# Patient Record
Sex: Male | Born: 1940
Health system: Southern US, Community
[De-identification: ages and names within clinical notes are randomized; demographics above are authoritative.]

## PROBLEM LIST (undated history)

## (undated) DIAGNOSIS — IMO0002 Reserved for concepts with insufficient information to code with codable children: Secondary | ICD-10-CM

## (undated) DIAGNOSIS — C9000 Multiple myeloma not having achieved remission: Secondary | ICD-10-CM

## (undated) DIAGNOSIS — K2210 Ulcer of esophagus without bleeding: Secondary | ICD-10-CM

## (undated) DIAGNOSIS — K449 Diaphragmatic hernia without obstruction or gangrene: Secondary | ICD-10-CM

## (undated) DIAGNOSIS — Z85820 Personal history of malignant melanoma of skin: Secondary | ICD-10-CM

## (undated) DIAGNOSIS — IMO0001 Reserved for inherently not codable concepts without codable children: Secondary | ICD-10-CM

## (undated) DIAGNOSIS — D126 Benign neoplasm of colon, unspecified: Secondary | ICD-10-CM

## (undated) DIAGNOSIS — N400 Enlarged prostate without lower urinary tract symptoms: Secondary | ICD-10-CM

## (undated) DIAGNOSIS — M1990 Unspecified osteoarthritis, unspecified site: Secondary | ICD-10-CM

## (undated) DIAGNOSIS — Z5111 Encounter for antineoplastic chemotherapy: Secondary | ICD-10-CM

## (undated) DIAGNOSIS — I82409 Acute embolism and thrombosis of unspecified deep veins of unspecified lower extremity: Secondary | ICD-10-CM

## (undated) DIAGNOSIS — M549 Dorsalgia, unspecified: Secondary | ICD-10-CM

## (undated) DIAGNOSIS — K5790 Diverticulosis of intestine, part unspecified, without perforation or abscess without bleeding: Secondary | ICD-10-CM

## (undated) DIAGNOSIS — M19041 Primary osteoarthritis, right hand: Secondary | ICD-10-CM

## (undated) DIAGNOSIS — I2699 Other pulmonary embolism without acute cor pulmonale: Secondary | ICD-10-CM

## (undated) DIAGNOSIS — K219 Gastro-esophageal reflux disease without esophagitis: Secondary | ICD-10-CM

## (undated) DIAGNOSIS — C4492 Squamous cell carcinoma of skin, unspecified: Secondary | ICD-10-CM

## (undated) DIAGNOSIS — C3491 Malignant neoplasm of unspecified part of right bronchus or lung: Principal | ICD-10-CM

## (undated) DIAGNOSIS — Z87438 Personal history of other diseases of male genital organs: Secondary | ICD-10-CM

## (undated) HISTORY — DX: Primary osteoarthritis, right hand: M19.041

## (undated) HISTORY — DX: Personal history of malignant melanoma of skin: Z85.820

## (undated) HISTORY — DX: Benign prostatic hyperplasia without lower urinary tract symptoms: N40.0

## (undated) HISTORY — DX: Personal history of other diseases of male genital organs: Z87.438

## (undated) HISTORY — DX: Diverticulosis of intestine, part unspecified, without perforation or abscess without bleeding: K57.90

## (undated) HISTORY — PX: OTHER SURGICAL HISTORY: SHX169

## (undated) HISTORY — PX: INGUINAL HERNIA REPAIR: SHX194

## (undated) HISTORY — DX: Benign neoplasm of colon, unspecified: D12.6

## (undated) HISTORY — DX: Ulcer of esophagus without bleeding: K22.10

## (undated) HISTORY — DX: Unspecified osteoarthritis, unspecified site: M19.90

## (undated) HISTORY — DX: Acute embolism and thrombosis of unspecified deep veins of unspecified lower extremity: I82.409

## (undated) HISTORY — DX: Squamous cell carcinoma of skin, unspecified: C44.92

## (undated) HISTORY — DX: Malignant neoplasm of unspecified part of right bronchus or lung: C34.91

## (undated) HISTORY — PX: APPENDECTOMY: SHX54

## (undated) HISTORY — DX: Gastro-esophageal reflux disease without esophagitis: K21.9

## (undated) HISTORY — DX: Reserved for concepts with insufficient information to code with codable children: IMO0002

## (undated) HISTORY — DX: Multiple myeloma not having achieved remission: C90.00

## (undated) HISTORY — DX: Diaphragmatic hernia without obstruction or gangrene: K44.9

## (undated) HISTORY — DX: Other pulmonary embolism without acute cor pulmonale: I26.99

## (undated) HISTORY — DX: Encounter for antineoplastic chemotherapy: Z51.11

---

## 1994-07-11 DIAGNOSIS — D126 Benign neoplasm of colon, unspecified: Secondary | ICD-10-CM

## 1994-07-11 HISTORY — DX: Benign neoplasm of colon, unspecified: D12.6

## 1995-05-30 ENCOUNTER — Encounter: Payer: Self-pay | Admitting: Gastroenterology

## 1997-11-24 ENCOUNTER — Encounter: Payer: Self-pay | Admitting: Gastroenterology

## 1998-04-02 ENCOUNTER — Ambulatory Visit (HOSPITAL_BASED_OUTPATIENT_CLINIC_OR_DEPARTMENT_OTHER): Admission: RE | Admit: 1998-04-02 | Discharge: 1998-04-02 | Payer: Self-pay | Admitting: Orthopedic Surgery

## 1998-07-11 HISTORY — PX: OTHER SURGICAL HISTORY: SHX169

## 1998-10-26 ENCOUNTER — Encounter: Payer: Self-pay | Admitting: Orthopedic Surgery

## 1998-10-26 ENCOUNTER — Inpatient Hospital Stay (HOSPITAL_COMMUNITY): Admission: RE | Admit: 1998-10-26 | Discharge: 1998-10-31 | Payer: Self-pay | Admitting: Orthopedic Surgery

## 1999-01-29 ENCOUNTER — Ambulatory Visit (HOSPITAL_COMMUNITY): Admission: RE | Admit: 1999-01-29 | Discharge: 1999-01-29 | Payer: Self-pay | Admitting: Orthopedic Surgery

## 2002-03-05 ENCOUNTER — Encounter: Admission: RE | Admit: 2002-03-05 | Discharge: 2002-03-05 | Payer: Self-pay | Admitting: Orthopedic Surgery

## 2002-03-05 ENCOUNTER — Encounter: Payer: Self-pay | Admitting: Orthopedic Surgery

## 2002-12-02 ENCOUNTER — Encounter: Payer: Self-pay | Admitting: Gastroenterology

## 2005-12-29 ENCOUNTER — Ambulatory Visit (HOSPITAL_BASED_OUTPATIENT_CLINIC_OR_DEPARTMENT_OTHER): Admission: RE | Admit: 2005-12-29 | Discharge: 2005-12-29 | Payer: Self-pay | Admitting: Orthopedic Surgery

## 2006-01-30 ENCOUNTER — Ambulatory Visit (HOSPITAL_COMMUNITY): Admission: RE | Admit: 2006-01-30 | Discharge: 2006-01-30 | Payer: Self-pay | Admitting: Orthopedic Surgery

## 2006-01-30 ENCOUNTER — Encounter: Payer: Self-pay | Admitting: Vascular Surgery

## 2007-05-03 ENCOUNTER — Encounter: Admission: RE | Admit: 2007-05-03 | Discharge: 2007-05-03 | Payer: Self-pay | Admitting: *Deleted

## 2007-07-12 HISTORY — PX: COLONOSCOPY: SHX174

## 2007-07-12 HISTORY — PX: ESOPHAGOGASTRODUODENOSCOPY: SHX1529

## 2007-11-07 ENCOUNTER — Encounter: Admission: RE | Admit: 2007-11-07 | Discharge: 2007-11-07 | Payer: Self-pay | Admitting: Family Medicine

## 2007-11-07 ENCOUNTER — Encounter (INDEPENDENT_AMBULATORY_CARE_PROVIDER_SITE_OTHER): Payer: Self-pay | Admitting: *Deleted

## 2007-11-08 ENCOUNTER — Telehealth: Payer: Self-pay | Admitting: Gastroenterology

## 2007-11-09 ENCOUNTER — Ambulatory Visit: Payer: Self-pay | Admitting: Gastroenterology

## 2007-11-13 ENCOUNTER — Ambulatory Visit: Payer: Self-pay | Admitting: Gastroenterology

## 2007-11-21 ENCOUNTER — Encounter (INDEPENDENT_AMBULATORY_CARE_PROVIDER_SITE_OTHER): Payer: Self-pay | Admitting: *Deleted

## 2007-11-21 ENCOUNTER — Encounter: Admission: RE | Admit: 2007-11-21 | Discharge: 2007-11-21 | Payer: Self-pay | Admitting: Family Medicine

## 2007-11-22 ENCOUNTER — Telehealth (INDEPENDENT_AMBULATORY_CARE_PROVIDER_SITE_OTHER): Payer: Self-pay

## 2007-11-23 ENCOUNTER — Ambulatory Visit: Payer: Self-pay | Admitting: Gastroenterology

## 2007-11-23 ENCOUNTER — Telehealth: Payer: Self-pay | Admitting: Gastroenterology

## 2007-11-23 ENCOUNTER — Encounter: Payer: Self-pay | Admitting: Gastroenterology

## 2007-11-26 ENCOUNTER — Encounter: Payer: Self-pay | Admitting: Gastroenterology

## 2007-11-27 ENCOUNTER — Encounter: Payer: Self-pay | Admitting: Gastroenterology

## 2007-11-27 ENCOUNTER — Telehealth: Payer: Self-pay | Admitting: Gastroenterology

## 2008-01-03 ENCOUNTER — Ambulatory Visit: Payer: Self-pay | Admitting: Gastroenterology

## 2008-01-03 DIAGNOSIS — Z860100 Personal history of colon polyps, unspecified: Secondary | ICD-10-CM | POA: Insufficient documentation

## 2008-01-03 DIAGNOSIS — K219 Gastro-esophageal reflux disease without esophagitis: Secondary | ICD-10-CM | POA: Insufficient documentation

## 2008-07-16 ENCOUNTER — Telehealth: Payer: Self-pay | Admitting: Gastroenterology

## 2008-11-27 ENCOUNTER — Telehealth: Payer: Self-pay | Admitting: Gastroenterology

## 2008-12-30 ENCOUNTER — Telehealth: Payer: Self-pay | Admitting: Gastroenterology

## 2009-02-19 ENCOUNTER — Ambulatory Visit: Payer: Self-pay | Admitting: Gastroenterology

## 2009-02-19 DIAGNOSIS — K589 Irritable bowel syndrome without diarrhea: Secondary | ICD-10-CM

## 2009-03-24 ENCOUNTER — Encounter: Admission: RE | Admit: 2009-03-24 | Discharge: 2009-03-24 | Payer: Self-pay | Admitting: *Deleted

## 2010-03-10 ENCOUNTER — Telehealth: Payer: Self-pay | Admitting: Gastroenterology

## 2010-04-29 ENCOUNTER — Ambulatory Visit: Payer: Self-pay | Admitting: Gastroenterology

## 2010-04-29 DIAGNOSIS — R1011 Right upper quadrant pain: Secondary | ICD-10-CM | POA: Insufficient documentation

## 2010-08-12 NOTE — Progress Notes (Signed)
Summary: Pantaprazole refill  Phone Note Call from Patient Call back at Work Phone [phone]   Call For: DR Russella Dar Reason for Call: Talk to Nurse Summary of Call: Pantaprazole refill was denied and feels he shouldnt have to come in for just a refill. Uses Mail order Initial call taken by: Leanor Kail [facility],  March 10, 2010 9:23 AM  Follow-up for Phone Call        Pt told he hasn't been seen since 02/2009 and has to be seen every year for medicine refills. Pt agreed to make appt and keep appt for further mail order refills. Rx was sent to pts pharmacy. Follow-up by: Christie Nottingham CMA [facility]),  March 10, 2010 10:11 AM    New/Updated Medications: PROTONIX 40 MG  TBEC (PANTOPRAZOLE SODIUM) 1 each day 30 minutes before meal Prescriptions: PROTONIX 40 MG  TBEC (PANTOPRAZOLE SODIUM) 1 each day 30 minutes before meal  #30 x 1   Entered by:   Christie Nottingham CMA (AAMA)   Authorized by:   Meryl Dare MD [facility]   Signed by:   Christie Nottingham CMA (AAMA) on 03/10/2010   Method used:   Electronically to        [facility]. [address] [phone]* (retail)       [address]       [place], [place]  [address]       Ph: [phone] or [phone]       Fax: [phone]   RxID:   [id number]

## 2010-08-12 NOTE — Assessment & Plan Note (Signed)
Summary: GERD f/u and med refill   History of Present Illness Visit Type: Follow-up Visit Primary GI MD: Elie Goody MD [facility] Primary Provider: Nancee Liter, MD Chief Complaint: RUQ pain x 2 months; GERD - med refills History of Present Illness:   This is a 70 year old white male with a history of GERD. His symptoms are under very good control on pantoprazole 40 mg daily. He relates infrequent episodes of severe, crampy pain in his right upper quadrant. It is generally brought on by movement and relieved by changes in position. It generally lasts a few minutes. Has no gastrointestinal symptoms. He feels it is likely a muscle cramp.   GI Review of Systems    Reports abdominal pain and  acid reflux.     Location of  Abdominal pain: RUQ.    Denies belching, bloating, chest pain, dysphagia with liquids, dysphagia with solids, heartburn, loss of appetite, nausea, vomiting, vomiting blood, weight loss, and  weight gain.      Reports black tarry stools, constipation, and  rectal pain.     Denies anal fissure, change in bowel habit, diarrhea, diverticulosis, fecal incontinence, heme positive stool, hemorrhoids, irritable bowel syndrome, jaundice, light color stool, liver problems, and  rectal bleeding.   Current Medications (verified): 1)  Protonix 40 Mg  Tbec (Pantoprazole Sodium) .Marland Kitchen.. 1 Each Day 30 Minutes Before Meal 2)  Multivitamins   Tabs (Multiple Vitamin) .... Take 1 Tablet By Mouth Once A Day 3)  Align   Caps (Misc Intestinal Flora Regulat) .... Take 1 Tablet By Mouth Once A Day 4)  Cosamin Ds 500-400 Mg Caps (Glucosamine-Chondroitin) .... Once Daily  Allergies (verified): 1)  ! Penicillin  Past History:  Past Medical History: Diverticulosis Degenerative disk disease L4-5/L5-S1 Irritable Bowel Syndrome Adenomatous Colon Polyps 1996 Hemorrhoids Enlarged Prostate Erosive esophagitis  Past Surgical History: Reviewed history from 01/03/2008 and no changes  required. Appendectomy-1980 Inguinal Hernia repair-1982 Knee Replacement(Left) Knee Arthroscopy (right)  Family History: Reviewed history from 01/03/2008 and no changes required. No FH of Colon Cancer: Family History of Diabetes: Father Family History of Heart Disease: Father  Social History: Reviewed history from 02/19/2009 and no changes required. Married Occupation: Special educational needs teacher Alcohol Use - yes-socially Patient is a former smoker. -Stopped 25 years ago Illicit Drug Use - no Patient gets regular exercise.  Review of Systems  The patient denies allergy/sinus, anemia, anxiety-new, arthritis/joint pain, back pain, blood in urine, breast changes/lumps, change in vision, confusion, cough, coughing up blood, depression-new, fainting, fatigue, fever, headaches-new, hearing problems, heart murmur, heart rhythm changes, itching, menstrual pain, muscle pains/cramps, night sweats, nosebleeds, pregnancy symptoms, shortness of breath, skin rash, sleeping problems, sore throat, swelling of feet/legs, swollen lymph glands, thirst - excessive , urination - excessive , urination changes/pain, urine leakage, vision changes, and voice change.    Vital Signs:  Patient profile:   70 year old male Height:      72 inches Weight:      211.13 pounds BMI:     28.74 Pulse rate:   76 / minute Pulse rhythm:   regular BP sitting:   112 / 68  (left arm) Cuff size:   regular  Vitals Entered By: June McMurray CMA [facility]) (April 29, 2010 10:35 AM)  Physical Exam  General:  Well developed, well nourished, no acute distress. Head:  Normocephalic and atraumatic. Eyes:  PERRLA, no icterus. Ears:  Normal auditory acuity. Mouth:  No deformity or lesions, dentition normal. Lungs:  Clear throughout to auscultation. Heart:  Regular rate and rhythm; no murmurs, rubs,  or bruits. Abdomen:  Soft, nontender and nondistended. No masses, hepatosplenomegaly or hernias noted. Normal bowel sounds. Psych:  Alert  and cooperative. Normal mood and affect.  Impression & Recommendations:  Problem # 1:  ABDOMINAL PAIN-RUQ (ICD-789.01) Brief episodes of right upper quadrant pain without associated GI symptoms. His symptoms are very typical for musculoskeletal pain. If his symptoms change or worsen he will contact us for consideration of further evaluation.  Problem # 2:  GERD (ICD-530.81) GERD with LA classification grade B erosive esophagitis. Continue pantoprazole 40 mg p.o. q. day, and standard antireflux measures.   Problem # 3:  PERSONAL HX COLONIC POLYPS (ICD-V12.72) Prior history of adenomatous colon polyps. Surveillance colonoscopy due 05/2013.  Patient Instructions: 1)  Your prescription has been sent to your mail order. 2)  Please schedule a follow-up appointment in 1 year. 3)  Copy sent to : Nancee Liter, MD 4)  The medication list was reviewed and reconciled.  All changed / newly prescribed medications were explained.  A complete medication list was provided to the patient / caregiver.  Prescriptions: PROTONIX 40 MG  TBEC (PANTOPRAZOLE SODIUM) 1 each day 30 minutes before meal  #90 x 3   Entered by:   Christie Nottingham CMA (AAMA)   Authorized by:   Meryl Dare MD [facility]   Signed by:   Christie Nottingham CMA (AAMA) on 04/29/2010   Method used:   Electronically to        [facility]* (mail-order)       [address]       [place], [place]  [address]       Ph: [phone]       Fax: [phone]   RxID:   [id number]

## 2010-09-02 ENCOUNTER — Other Ambulatory Visit: Payer: Self-pay | Admitting: Family Medicine

## 2010-09-02 ENCOUNTER — Ambulatory Visit (INDEPENDENT_AMBULATORY_CARE_PROVIDER_SITE_OTHER): Payer: Medicare Other | Admitting: Family Medicine

## 2010-09-02 DIAGNOSIS — K769 Liver disease, unspecified: Secondary | ICD-10-CM

## 2010-09-02 DIAGNOSIS — Z0000 Encounter for general adult medical examination without abnormal findings: Secondary | ICD-10-CM

## 2010-09-07 ENCOUNTER — Other Ambulatory Visit: Payer: Medicare Other

## 2010-09-08 ENCOUNTER — Other Ambulatory Visit: Payer: Medicare Other

## 2010-09-08 ENCOUNTER — Ambulatory Visit
Admission: RE | Admit: 2010-09-08 | Discharge: 2010-09-08 | Disposition: A | Discharge status: Home / Self Care | Payer: Medicare Other | Source: Ambulatory Visit | Attending: Family Medicine | Admitting: Family Medicine

## 2010-09-08 DIAGNOSIS — K769 Liver disease, unspecified: Secondary | ICD-10-CM

## 2010-11-23 NOTE — Assessment & Plan Note (Signed)
[facility]                         GASTROENTEROLOGY OFFICE NOTE   STRATTON, VILLWOCK                     MRN:          [id number]  DATE:11/13/2007                            DOB:          [birth year]    REFERRING PHYSICIAN:  Lavonda Jumbo, M.D.   PROBLEM:  Abdominal bloating and cramping.   HISTORY:  [name] is a 70 year old white male known to Dr. Russella Dar from  previous evaluation.  He was seen here in 1998 and at that time was seen  for epididymitis.  He had undergone colonoscopy in 1996 and was found to  have left-sided diverticulosis and one rectal polyp.  Follow-up  colonoscopy was done in 2004 with no evidence for recurrent polyps.  He  did have internal hemorrhoids and left-sided diverticular disease.   The patient is due for a follow-up colonoscopy and states that he has  this scheduled for May 15th with Dr. Russella Dar.   Over the past month or so, he has been experiencing persistent problems  with post-prandial abdominal bloating and cramping, which he feels is in  his upper abdomen.  This seems to be worse after his dinner meal and  occurring on a daily basis.  He has not had any nausea or vomiting.  His  appetite has been fine.  His weight has been stable.  He denies any  heartburn, indigestion, dysphagia, or odynophagia.  He does feel that he  has been somewhat constipated and that his bowel movements have been  irregular.  He has noted any melena or hematochezia.  He has not been on  any new medications or supplements but on further questioning, he has  been taking 3-6 ibuprofen every day over the past few months for back  pain.  He started on some Culturelle probiotic supplement within the  past week on the advice of one of his family members and says this does  seem to be helping a bit.  Because of his complaints of discomfort and  upper abdominal bloating, he underwent a CT scan of the abdomen and  pelvis, which was ordered by Dr.  Lynelle Doctor.  This was done at [facility] on April 29th.  This showed a poorly defined area of low  attenuation posteriorly in the right lobe of the liver near the dome,  possibly representing a hemangioma.  The bladder was not distended.  No  gallstones were noted.  He also was noted to have a peripheral  parenchymal defect in the posterior mid left kidney of questionable  significance.  Otherwise, negative exam.  He has some degenerative disk  disease noted at L4-5/L5-S1 levels.  He is scheduled for MRI of the  abdomen on May 13th to further delineate the liver.   CURRENT MEDICATIONS:  1. Uroxatral 1 daily.  2. Avodart 1 daily.  3. Culturelle supplement daily.   ALLERGIES/INTOLERANCES:  PENICILLIN.   PAST HISTORY:  He is status post appendectomy in 1980.  Inguinal hernia  repair in 1982.  He had a left knee replacement, otherwise has been  healthy.  Colon polyps and diverticulosis, as noted above.  FAMILY HISTORY:  Positive for colon polyps.  Negative for colon cancer.   SOCIAL HISTORY:  Patient is married.  He is employed as a Chief Strategy Officer.  He is a nonsmoker.  He drinks wine socially.   REVIEW OF SYSTEMS:  CARDIOVASCULAR:  Negative for chest pain or anginal  symptoms.  PULMONARY:  Negative for cough, shortness of breath, or  sputum production.  GENITOURINARY:  Denies any dysuria, urgency, or  frequency.  GI:  As outlined above.  MUSCULOSKELETAL:  Positive for low  back pain.  He has also noted some right-sided back pain recently.  All  other review of systems negative.   PHYSICAL EXAMINATION:  A well-developed, healthy-appearing white male in  no acute distress.  Blood pressure 102/60, pulse 82.  Height is 6 foot 4.  Weight is 214.  HEENT:  Normocephalic and atraumatic.  EOMI.  PERRLA.  Sclerae are  anicteric.  NECK:  Supple and without nodes.  CARDIOVASCULAR:  Regular rate and rhythm with S1 and S2.  No murmur, rub  or gallop.  PULMONARY:  Clear to A&P.   ABDOMEN:  Soft.  He is mildly tender in the epigastrium.  There is no  guarding or rebound.  No palpable mass or hepatosplenomegaly.  Bowel  sounds are active.  He did have a remote liposuction procedure to the  abdominal wall.  RECTAL:  Not done today.   IMPRESSION:  34. A 70 year old white male with one month history of abdominal      bloating and cramping in the upper abdomen.  Etiology is unclear.      Recent CT scan of the abdomen and pelvis negative for any acute      findings.  Rule out nonsteroidal-induced gastropathy or enteropathy      accounting for his symptoms.  2. Abnormal CT scan of the liver, rule out hemangioma.  3. Left kidney parenchymal defect of questionable significance.  4. History of colon polyps.  A follow-up colonoscopy scheduled for May      15th.  5. History of diverticulosis.   PLAN:  1. Will schedule patient for upper endoscopy on May 15th as well as      colonoscopy, which had been previously scheduled.  2. Follow up on MRI of the abdomen to be done at [facility] on      May 13th.   DISCHARGE MEDICATIONS:  1. A trial of Align 1 p.o. q.a.m.  2. Gas-X 1 p.o. before meals.  3. Protonix 40 mg p.o. q.a.m.  Samples given today.  4. Discontinue ibuprofen.  5. Add MiraLax 17 gm daily in 8 ounces of water until colonoscopy can      be completed.      Mike Gip, PA-C  Electronically Signed      Vania Rea. Jarold Motto, MD, Caleen Essex, FAGA  Electronically Signed   AE/MedQ  DD: 11/13/2007  DT: 11/13/2007  Job #: [id number]   cc:   Venita Lick. Russella Dar, MD, Jenelle Mages, M.D.

## 2010-11-26 NOTE — Op Note (Signed)
[name], [name]                 ACCOUNT NO.:  [id number]   MEDICAL RECORD NO.:  [id number]          PATIENT TYPE:  AMB   LOCATION:  DSC                          FACILITY:  [facility]   PHYSICIAN:  Loreta Ave, M.D. DATE OF BIRTH:  08/17/1940   DATE OF PROCEDURE:  12/29/2005  DATE OF DISCHARGE:                                 OPERATIVE REPORT   PREOPERATIVE DIAGNOSIS:  Right knee medial meniscal tear.   POSTOPERATIVE DIAGNOSIS:  Right knee medial and lateral meniscal tear with  diffuse grade 3 chondromalacia, all three compartments with some focal grade  4 changes medially at the posterior aspect of the distal femur.   PROCEDURE:  Right knee examination under anesthesia, arthroscopy, partial  mediolateral meniscectomy with generalized three compartment chondroplasty.   SURGEON:  Loreta Ave, M.D.   ASSISTANT:  Genene Churn. Denton Meek.   ANESTHESIA:  General.   BLOOD LOSS:  Minimal.   TOURNIQUET TIME:  Not employed.   SPECIMENS:  None.   CULTURES:  None.   COMPLICATIONS:  None.   DRESSING:  Soft compressive.   PROCEDURE:  Patient brought to the operating room and placed on the  operating room table in a supine position.  After adequate anesthesia had  been obtained, the knee examined.  A little bit of varus but stable with  good motion.  Tourniquet and leg holder applied.  The leg prepped and draped  in the usual sterile fashion.  Three portals created, one superolateral, one  each medial and lateral parapatellar.  The inflow catheter introduced.  The  knee distended.  The arthroscope introduced.  The knee inspected.  Grade 2  and 3 chondromalacia diffusely on the patella, debrided.  The trochlea  looked good.  A lot of hypertrophic synovitis throughout.  All debrided.  Hemostasis with cautery.  The medial compartment marked.  Complex tearing,  medial meniscus, posterior half.  The entire posterior half removed.  __________  meniscus.  In full extension, the  compartment looked reasonably  good but in flexion, a lot of grade 3 changes, mainly on the femur with  marked thinning and one area down to bone.  Not an isolated lesion, lending  itself to microfracturing, just a pattern of diffuse degeneration.  Chondroplasty for unstable fragments.  Cruciate ligaments intact.  The  lateral compartment, again in full extension, did not look bad, but there  are fairly significant grade 3 changes, more posteriorly.  A complex  tearing, middle and anterior third of the meniscus, __________ .  A little  flap tear in the back, also debrided, leaving some meniscus around the  completion.  The entire knee examined.  No other findings appreciated.  Instruments and fluid removed.  Portals were  injected with Marcaine.  Portals were closed with 4-0 nylon.  Sterile  compressive dressing applied.  Anesthesia reversed.  Brought to the recovery  room.  Tolerated surgery well.  No complications.      Loreta Ave, M.D.  Electronically Signed     DFM/MEDQ  D:  12/29/2005  T:  12/29/2005  Job:  [id number] 

## 2010-12-02 ENCOUNTER — Encounter: Payer: Self-pay | Admitting: Medical

## 2010-12-02 ENCOUNTER — Ambulatory Visit (INDEPENDENT_AMBULATORY_CARE_PROVIDER_SITE_OTHER): Payer: Medicare Other | Admitting: Medical

## 2010-12-02 VITALS — BP 130/66 | HR 72 | Temp 98.0°F | Ht 72.0 in | Wt 213.0 lb

## 2010-12-02 DIAGNOSIS — J189 Pneumonia, unspecified organism: Secondary | ICD-10-CM

## 2010-12-02 MED ORDER — LEVOFLOXACIN 500 MG PO TABS: 500.0000 mg | ORAL_TABLET | Freq: Every day | ORAL | Status: AC

## 2010-12-02 MED ORDER — HYDROCODONE-HOMATROPINE 5-1.5 MG/5ML PO SYRP: 5.0000 mL | ORAL_SOLUTION | Freq: Four times a day (QID) | ORAL | Status: AC | PRN

## 2010-12-02 NOTE — Progress Notes (Signed)
Subjective:     [name] is a 70 y.o. male who presents for evaluation of chills, headache occipital, productive cough and sweats. He has been traveling , just got back from [place], has had some long road trips as well. His main complaint is 10 days of cough, chills, sweats, fatigue just not feeling well at all. Symptoms include achiness, congestion, fever subjective, nasal congestion, productive cough with  yellow colored sputum and sore throat. Onset of symptoms was 10 days ago, and has been gradually worsening since that time. Treatment to date: cough suppressants.  They deny hx/o bronchitis in the past.  Denies sick contacts.  No other aggravating or relieving factors.  No other c/o.  The following portions of the patient's history were reviewed and updated as appropriate: allergies, current medications, past family history, past medical history, past social history, past surgical history and problem list.  Past Medical History  Diagnosis Date  . GERD (gastroesophageal reflux disease)   . Colon polyp   . Arthritis     Osteoarthritis right knee  . Benign prostatic hypertrophy   . History of chronic prostatitis   . History of melanoma     Back  . Diverticulosis   . Arthritis of hand, right     Right thumb    Review of Systems Constitutional: +low grade fever, chills, sweats, anorexia Skin: denies rash HEENT: +sore throat; denies ear pain, itchy watery eyes Cardiovascular: denies chest pain, palpitations Lungs: +mild SOB, +productive sputum; denies wheezing, hemoptysis, orthopnea, PND Abdomen: denies abdominal pain, nausea, vomiting, diarrhea GU: denies dysuria Extremities: Positive myalgias; denies edema, arthralgias  Objective:   Filed Vitals:   12/02/10 1624  BP: 130/66  Pulse: 72  Temp: 98 F (36.7 C)    General appearance: Alert, WD/WN, no distress, ill appearing                             Skin: warm, somewhat diaphoretic, no rash     Head: no sinus tenderness                            Eyes: conjunctiva normal, corneas clear, PERRLA                            Ears: pearly TMs, external ear canals normal                          Nose: septum midline, turbinates swollen, with erythema and clear discharge             Mouth/throat: MMM, tongue normal, mild pharyngeal erythema                           Neck: supple, no adenopathy, no thyromegaly, nontender                          Heart: RRR, normal S1, S2, no murmurs                         Lungs: + Right lower fields with rhonchi, decreased to percussion, breath sounds seem to have a consolidation in the same area, somewhat scattered wheezes, no rales  Extremities: no edema, nontender     Assessment:   Encounter Diagnosis  Name Primary?  . Pneumonia Yes    Plan:   Prescription given today for Levaquin and Hycodan syrup as below.  Discussed diagnosis and treatment of pneumonia.  Tylenol or Ibuprofen OTC for fever and malaise.  Call/return in 2-3 days if symptoms are worse or not improving.  Advised that cough may linger even after the infection is improved.   At this point outpatient treatment seems reasonable, he understands and agrees with plan.

## 2010-12-03 ENCOUNTER — Ambulatory Visit: Payer: Medicare Other | Admitting: Family Medicine

## 2010-12-09 ENCOUNTER — Encounter: Payer: Self-pay | Admitting: *Deleted

## 2010-12-15 ENCOUNTER — Encounter: Payer: Self-pay | Admitting: Family Medicine

## 2010-12-15 ENCOUNTER — Ambulatory Visit (INDEPENDENT_AMBULATORY_CARE_PROVIDER_SITE_OTHER): Payer: Medicare Other | Admitting: Family Medicine

## 2010-12-15 VITALS — BP 118/68 | HR 72 | Ht 72.0 in | Wt 204.0 lb

## 2010-12-15 DIAGNOSIS — J189 Pneumonia, unspecified organism: Secondary | ICD-10-CM

## 2010-12-15 NOTE — Progress Notes (Signed)
Subjective:    Patient ID: [name], male    DOB: 1941-03-05, 70 y.o.   MRN: [id number]  HPI Patient presents for follow up on pneumonia.  He was diagnosed with a right-sided pneumonia on 5/24, and was treated with Levaquin.  Fever and chills have resolved.  Cough is much improved, occasional lingering cough.  Denies any phlegm production. Energy is still not normal, taking naps during the day which is unusual for him.  He presents today requesting to be released to resume an exercise regimen.  Past Medical History  Diagnosis Date  . GERD (gastroesophageal reflux disease)   . Colon polyp   . Arthritis     Osteoarthritis right knee  . Benign prostatic hypertrophy   . History of chronic prostatitis   . History of melanoma     Back  . Diverticulosis   . Arthritis of hand, right     Right thumb    Past Surgical History  Procedure Date  . Total knee replaced     Left knee  . Appendectomy   . Colonoscopy 2009  . Esophagogastroduodenoscopy 2009  . Excision of melanoma   . Great toe surgery     bilateral    History   Social History  . Marital Status: Married    Spouse Name: N/A    Number of Children: N/A  . Years of Education: N/A   Occupational History  . Not on file.   Social History Main Topics  . Smoking status: Former Games developer  . Smokeless tobacco: Never Used  . Alcohol Use: 1.5 oz/week    3 drink(s) per week  . Drug Use: No  . Sexually Active: Yes -- Male partner(s)     retired Special educational needs teacher   Other Topics Concern  . Not on file   Social History Narrative  . No narrative on file    Family History  Problem Relation Age of Onset  . Dementia Mother   . Stroke Father   . Hypertension Father   . Diabetes Father   . Prostate cancer Maternal Grandfather     Current outpatient prescriptions:pantoprazole (PROTONIX) 40 MG tablet, Take 40 mg by mouth daily.  , Disp: , Rfl:   Allergies  Allergen Reactions  . Penicillins     REACTION: rash    Immunization History  Administered Date(s) Administered  . Pneumococcal Polysaccharide 12/09/2005  . Td 06/11/2003  . Zoster 09/02/2010    Review of Systems Leg cramps last night (long night of driving/traveling).  Occasional triggering of R thumb.  No nausea, vomiting, diarrhea, skin rash, shortness of breath.  Some fatigue   Objective:   Physical Exam BP 118/68  Pulse 72  Ht 6' (1.829 m)  Wt 204 lb (92.534 kg)  BMI 27.67 kg/m2 General:  Pleasant, talkative male in no distress.  Occasional throat-clearing, no cough Heart:  Regular rate and rhythm, no murmur, rub, gallop Lungs:  Good air movement throughout.  Trace crackle R base.  No wheezes or rales Extremities:  No edema Psych:  Normal mood, affect, hygiene and grooming       Assessment & Plan:   1. Pneumonia  DG Chest 2 View   Clinically improved, although some residual rales on exam.  CXR ordered for 2-3 weeks, to call and have done sooner if any recurrent symptoms (fever, cough).   Increase exercise as tolerated.  Expect energy to gradually improve over the next few weeks.  Pt advised that he already received his pneumonia  vaccine at his recent CPE (and discussed that this protects only from certain pneumococcal strains, not all types of pneumonia).  F/U prn, or at routine yearly physical

## 2011-01-04 ENCOUNTER — Telehealth: Payer: Self-pay | Admitting: *Deleted

## 2011-01-04 ENCOUNTER — Ambulatory Visit
Admission: RE | Admit: 2011-01-04 | Discharge: 2011-01-04 | Disposition: A | Discharge status: Home / Self Care | Payer: Medicare Other | Source: Ambulatory Visit | Attending: Family Medicine | Admitting: Family Medicine

## 2011-01-04 DIAGNOSIS — J189 Pneumonia, unspecified organism: Secondary | ICD-10-CM

## 2011-01-04 NOTE — Telephone Encounter (Signed)
Left message on patient's vm informing him that his CXR results were negative, pneumonia resolved.

## 2011-02-08 ENCOUNTER — Encounter: Payer: Self-pay | Admitting: Family Medicine

## 2011-03-08 ENCOUNTER — Other Ambulatory Visit: Payer: Self-pay | Admitting: Dermatology

## 2011-03-30 ENCOUNTER — Other Ambulatory Visit: Payer: Self-pay | Admitting: Dermatology

## 2011-04-05 ENCOUNTER — Ambulatory Visit (INDEPENDENT_AMBULATORY_CARE_PROVIDER_SITE_OTHER): Payer: Medicare Other | Admitting: Family Medicine

## 2011-04-05 ENCOUNTER — Encounter: Payer: Self-pay | Admitting: Family Medicine

## 2011-04-05 VITALS — BP 102/64 | Ht 72.0 in | Wt 208.0 lb

## 2011-04-05 DIAGNOSIS — R0609 Other forms of dyspnea: Secondary | ICD-10-CM

## 2011-04-05 DIAGNOSIS — Z23 Encounter for immunization: Secondary | ICD-10-CM

## 2011-04-05 NOTE — Progress Notes (Signed)
Chief Complaint:  difficulty breathing while hiking this weekend. Notes that he has been having difficulty breathing in the past 8-9 months with any incline while walking.  Thinks he has noted this problem prior to his episode of pneumonia, ongoing for about 8-9 months.  Has gotten gradually worse.  He has noticed that his walking has slowed down, but remains active and walking daily.  Notices this problem mainly when hiking at elevation, or with inclines.  Plans for R total knee replacement 06/08/11.  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 Has never had stress test  Past Medical History  Diagnosis Date  . GERD (gastroesophageal reflux disease)     (Dr. Russella Dar)  . Colon polyp     (Dr. Russella Dar)  . Arthritis     Osteoarthritis right knee  . Benign prostatic hypertrophy     (Dr. Vernie Ammons)  . History of chronic prostatitis   . History of melanoma     Back (Dr. Karlyn Agee); early melanoma R arm 03/2011  . Diverticulosis   . Arthritis of hand, right     Right thumb (Dr. Teressa Senter)    Past Surgical History  Procedure Date  . Total knee replaced 2000    Left knee  . Appendectomy   . Colonoscopy 2009  . Esophagogastroduodenoscopy 2009    mild inflammation at esophagogastric junction  . Excision of melanoma     back ( x3)  . Great toe surgery     bilateral  . Inguinal hernia repair     left, x 2    History   Social History  . Marital Status: Married    Spouse Name: N/A    Number of Children: N/A  . Years of Education: N/A   Occupational History  . Not on file.   Social History Main Topics  . Smoking status: Former Games developer  . Smokeless tobacco: Never Used  . Alcohol Use: 1.5 oz/week    3 drink(s) per week  . Drug Use: No  . Sexually Active: Yes -- Male partner(s)     retired Special educational needs teacher   Other Topics Concern  . Not on file   Social History Narrative  . No narrative on file    Family History  Problem  Relation Age of Onset  . Dementia Mother   . Stroke Father   . Hypertension Father   . Diabetes Father   . Prostate cancer Maternal Grandfather     Current outpatient prescriptions:pantoprazole (PROTONIX) 40 MG tablet, Take 40 mg by mouth daily.  , Disp: , Rfl: ;  Probiotic Product (ALIGN PO), Take 1 capsule by mouth daily.  , Disp: , Rfl:   Allergies  Allergen Reactions  . Penicillins     REACTION: rash   ROS:  Denies fevers, cough, GI complaints, edema, PND, chest pain, or other concerns.  No h/o asthma or need for inhaler in past.  PHYSICAL EXAM: BP 102/64  Ht 6' (1.829 m)  Wt 208 lb (94.348 kg)  BMI 28.21 kg/m2 Pleasant male, in no distress Neck: no lymphadenopathy or mass Heart: regular rate and rhythm without murmur Lungs: Mild crackles at both bases--after cough and a few deep breaths, lungs were entirely clear.  Good air movement throughout Extremities: no edema Skin: no rash  ASSESSMENT/PLAN: 1. Dyspnea on exertion  Ambulatory referral to Pulmonology, Ambulatory referral to Cardiology, CBC with Differential  2. Need for prophylactic vaccination and inoculation against influenza  Flu vaccine greater than or equal to 3yo preservative free IM    Refer to cardiology for evaluation--likely will need stress test (+/_ echo), especially with upcoming surgery planned Refer to pulmonary--PFT's and consult Recent CXR was normal.   CBC today

## 2011-04-06 ENCOUNTER — Telehealth: Payer: Self-pay | Admitting: Family Medicine

## 2011-04-06 LAB — CBC WITH DIFFERENTIAL/PLATELET
Basophils Absolute: 0 10*3/uL (ref 0.0–0.1)
Eosinophils Absolute: 0.1 10*3/uL (ref 0.0–0.7)
Eosinophils Relative: 2 % (ref 0–5)
HCT: 33.4 % — ABNORMAL LOW (ref 39.0–52.0)
Lymphocytes Relative: 22 % (ref 12–46)
Lymphs Abs: 1.6 10*3/uL (ref 0.7–4.0)
MCH: 31.2 pg (ref 26.0–34.0)
MCV: 94.6 fL (ref 78.0–100.0)
Monocytes Absolute: 0.5 10*3/uL (ref 0.1–1.0)
Platelets: 224 10*3/uL (ref 150–400)
RDW: 15.5 % (ref 11.5–15.5)
WBC: 7.4 10*3/uL (ref 4.0–10.5)

## 2011-04-06 MED ORDER — ALBUTEROL SULFATE HFA 108 (90 BASE) MCG/ACT IN AERS: 2.0000 | INHALATION_SPRAY | Freq: Four times a day (QID) | RESPIRATORY_TRACT | Status: DC | PRN

## 2011-04-06 NOTE — Telephone Encounter (Signed)
Advised of mild anemia, and that further tests are being added to the blood that was drawn.  He had some SOB while walking at the park today.  Pulm and cardiology appts are about 2 weeks out, so we discussed trial of inhaler prior to exercise and prn SOB. Discussed how and when to use

## 2011-04-07 ENCOUNTER — Other Ambulatory Visit: Payer: Self-pay | Admitting: *Deleted

## 2011-04-07 ENCOUNTER — Other Ambulatory Visit: Payer: Medicare Other

## 2011-04-07 DIAGNOSIS — D649 Anemia, unspecified: Secondary | ICD-10-CM

## 2011-04-08 ENCOUNTER — Encounter: Payer: Self-pay | Admitting: Family Medicine

## 2011-04-08 LAB — VITAMIN B12: Vitamin B-12: 695 pg/mL (ref 211–911)

## 2011-04-08 LAB — IRON: Iron: 82 ug/dL (ref 42–165)

## 2011-04-18 ENCOUNTER — Encounter: Payer: Self-pay | Admitting: Cardiology

## 2011-04-18 ENCOUNTER — Ambulatory Visit (INDEPENDENT_AMBULATORY_CARE_PROVIDER_SITE_OTHER): Payer: Medicare Other | Admitting: Cardiology

## 2011-04-18 DIAGNOSIS — Z01810 Encounter for preprocedural cardiovascular examination: Secondary | ICD-10-CM

## 2011-04-18 DIAGNOSIS — R0600 Dyspnea, unspecified: Secondary | ICD-10-CM

## 2011-04-18 DIAGNOSIS — R079 Chest pain, unspecified: Secondary | ICD-10-CM | POA: Insufficient documentation

## 2011-04-18 MED ORDER — ASPIRIN EC 81 MG PO TBEC: 81.0000 mg | DELAYED_RELEASE_TABLET | ORAL | Status: DC

## 2011-04-18 NOTE — Assessment & Plan Note (Signed)
His symptoms of chest pain and dyspnea on exertion are concerning for angina. His ECG is normal. We will schedule him for a stress nuclear study to further stratify his cardiovascular risk. If this is normal we will clear him for his planned knee surgery. If abnormal he will need a cardiac catheterization. I recommended that he take a baby aspirin daily. Recommended that he stop walking if he develops symptoms of chest discomfort and not try to walk through it. If he has any pain at rest or pain that does not resolve with rest he is to call and come to the emergency room.

## 2011-04-18 NOTE — Patient Instructions (Signed)
We will schedule you for a stress nuclear study.  Continue your current medications.

## 2011-04-18 NOTE — Progress Notes (Signed)
[name] Date of Birth: 1941-04-06   History of Present Illness: [name] is seen at the request of Dr. Lynelle Doctor for violation of chest pain and dyspnea. He is a pleasant 70 year old white male. He reports that he is scheduled for a right total knee replacement at the end of November. He has noticed over the last 4-6 months symptoms of shortness of breath on exertion. He has a raspy feeling in his throat. His symptoms are worse when he walks up an incline. He typically will walks for 1 hour per day but states he can no longer keep up with his wife. He is also been experiencing a mid to left substernal chest tightness with exertion. It does not radiate. It does also occur with exertion and is relieved with rest. He reports no history of hypertension, diabetes, or hypercholesterolemia. He smoked heavily in the past but quit 30 years ago. He had one episode in February of this year where he awoke with severe substernal chest burning. He was seen in emergency department an ECG was okay. He had a CT scan. He was given some type of shot for pain and his symptoms resolve.  Current Outpatient Prescriptions on File Prior to Visit  Medication Sig Dispense Refill  . albuterol (PROVENTIL HFA;VENTOLIN HFA) 108 (90 BASE) MCG/ACT inhaler Inhale 2 puffs into the lungs every 6 (six) hours as needed for wheezing or shortness of breath (may also use 20 minutes prior to exercise).  18 g  1  . pantoprazole (PROTONIX) 40 MG tablet Take 40 mg by mouth daily.        . Probiotic Product (ALIGN PO) Take 1 capsule by mouth daily.          Allergies  Allergen Reactions  . Penicillins     REACTION: rash    Past Medical History  Diagnosis Date  . GERD (gastroesophageal reflux disease)     (Dr. Russella Dar)  . Colon polyp     (Dr. Russella Dar)  . Arthritis     Osteoarthritis right knee  . Benign prostatic hypertrophy     (Dr. Vernie Ammons)  . History of chronic prostatitis   . History of melanoma     Back (Dr. Karlyn Agee);  early melanoma R arm 03/2011  . Diverticulosis   . Arthritis of hand, right     Right thumb (Dr. Teressa Senter)    Past Surgical History  Procedure Date  . Total knee replaced 2000    Left knee  . Appendectomy   . Colonoscopy 2009  . Esophagogastroduodenoscopy 2009    mild inflammation at esophagogastric junction  . Excision of melanoma     back ( x3)  . Great toe surgery     bilateral  . Inguinal hernia repair     left, x 2    History  Smoking status  . Former Smoker  Smokeless tobacco  . Never Used  Comment: 3 PPD x 6 years    History  Alcohol Use  . 1.5 oz/week  . 3 drink(s) per week    Family History  Problem Relation Age of Onset  . Dementia Mother   . Stroke Father   . Hypertension Father   . Diabetes Father   . Prostate cancer Maternal Grandfather     Review of Systems: The review of systems is positive for right knee pain. He wears a brace when he walks. He is scheduled to see pulmonary this week as well. All other systems were reviewed and  are negative.  Physical Exam: BP 125/79  Pulse 73  Ht 5\' 11"  (1.803 m)  Wt 207 lb 12.8 oz (94.257 kg)  BMI 28.98 kg/m2 The patient is alert and oriented x 3.  The mood and affect are normal.  The skin is warm and dry.  Color is normal.  The HEENT exam reveals that the sclera are nonicteric.  The mucous membranes are moist.  The carotids are 2+ without bruits.  There is no thyromegaly.  There is no JVD.  The lungs are clear.  The chest wall is non tender.  The heart exam reveals a regular rate with a normal S1 and S2.  There are no murmurs, gallops, or rubs.  The PMI is not displaced.   Abdominal exam reveals good bowel sounds.  There is no guarding or rebound.  There is no hepatosplenomegaly or tenderness.  There are no masses.  Exam of the legs reveal no clubbing, cyanosis, or edema.  The legs are without rashes.  The distal pulses are intact.  Cranial nerves II - XII are intact.  Motor and sensory functions are intact.  The  gait is normal.  LABORATORY DATA: ECG demonstrates normal sinus rhythm with a normal ECG.  Assessment / Plan:

## 2011-04-19 ENCOUNTER — Encounter: Payer: Self-pay | Admitting: Emergency Medicine

## 2011-04-19 ENCOUNTER — Ambulatory Visit (INDEPENDENT_AMBULATORY_CARE_PROVIDER_SITE_OTHER): Payer: Medicare Other | Admitting: Emergency Medicine

## 2011-04-19 ENCOUNTER — Ambulatory Visit (INDEPENDENT_AMBULATORY_CARE_PROVIDER_SITE_OTHER)
Admission: RE | Admit: 2011-04-19 | Discharge: 2011-04-19 | Disposition: A | Discharge status: Home / Self Care | Payer: Medicare Other | Source: Ambulatory Visit | Attending: Emergency Medicine | Admitting: Emergency Medicine

## 2011-04-19 VITALS — BP 100/62 | HR 69 | Temp 97.6°F | Ht 72.0 in | Wt 209.6 lb

## 2011-04-19 DIAGNOSIS — R0609 Other forms of dyspnea: Secondary | ICD-10-CM

## 2011-04-19 DIAGNOSIS — R0600 Dyspnea, unspecified: Secondary | ICD-10-CM

## 2011-04-19 NOTE — Progress Notes (Signed)
Subjective:    Patient ID: [name], male    DOB: 25-Nov-1940, 70 y.o.   MRN: [id number]  HPI 70 yo former smoker, little PMH except for GERD, melenoma. Also w DJD planning for R knee surgery ( Dr Richardson Landry) 11/28. He is very active, walks for an hour a day, but for the last 9 months has noticed a slight decrease in his capacity. He was treated for viral syndrome and possible PNA in May '12. Planning for cardiac stress test w Dr [place] next week pre-op. No other breathing symptoms. Was tried empirically on albuterol without effect.    Review of Systems  Constitutional: Negative for fever and unexpected weight change.  Respiratory: Positive for shortness of breath.     Past Medical History  Diagnosis Date  . GERD (gastroesophageal reflux disease)     (Dr. Russella Dar)  . Colon polyp     (Dr. Russella Dar)  . Arthritis     Osteoarthritis right knee  . Benign prostatic hypertrophy     (Dr. Vernie Ammons)  . History of chronic prostatitis   . History of melanoma     Back (Dr. Karlyn Agee); early melanoma R arm 03/2011  . Diverticulosis   . Arthritis of hand, right     Right thumb (Dr. Teressa Senter)     Family History  Problem Relation Age of Onset  . Dementia Mother   . Stroke Father   . Hypertension Father   . Diabetes Father   . Prostate cancer Maternal Grandfather      History   Social History  . Marital Status: Married    Spouse Name: N/A    Number of Children: 2  . Years of Education: N/A   Occupational History  . mortgage banking    Social History Main Topics  . Smoking status: Former Smoker -- 3.0 packs/day for 10 years  . Smokeless tobacco: Never Used   Comment: 3 PPD x 6 years  . Alcohol Use: 1.5 oz/week    3 drink(s) per week  . Drug Use: No  . Sexually Active: Yes -- Male partner(s)     retired Special educational needs teacher   Other Topics Concern  . Not on file   Social History Narrative  . No narrative on file     Allergies  Allergen Reactions  . Penicillins    REACTION: rash     Outpatient Prescriptions Prior to Visit  Medication Sig Dispense Refill  . albuterol (PROVENTIL HFA;VENTOLIN HFA) 108 (90 BASE) MCG/ACT inhaler Inhale 2 puffs into the lungs every 6 (six) hours as needed for wheezing or shortness of breath (may also use 20 minutes prior to exercise).  18 g  1  . aspirin EC 81 MG tablet Take 1 tablet (81 mg total) by mouth every other day.  150 tablet  2  . pantoprazole (PROTONIX) 40 MG tablet Take 40 mg by mouth daily.        . Probiotic Product (ALIGN PO) Take 1 capsule by mouth daily.        . traMADol (ULTRAM) 50 MG tablet Ad lib.            Objective:   Physical Exam  Gen: Pleasant, well-nourished, in no distress,  normal affect  ENT: No lesions,  mouth clear,  oropharynx clear, no postnasal drip  Neck: No JVD, no TMG, no carotid bruits  Lungs: No use of accessory muscles, no dullness to percussion, clear without rales or rhonchi  Cardiovascular: RRR, heart sounds normal,  no murmur or gallops, no peripheral edema  Abdomen: soft and NT, no HSM,  BS normal  Musculoskeletal: No deformities, no cyanosis or clubbing  Neuro: alert, non focal  Skin: Warm, no lesions or rashes      Assessment & Plan:  Dyspnea Mild exertional SOB, hx tobacco.  - check PFT - CXR today - I will call him results, see him if any abnormality

## 2011-04-19 NOTE — Assessment & Plan Note (Signed)
Mild exertional SOB, hx tobacco.  - check PFT - CXR today - I will call him results, see him if any abnormality

## 2011-04-19 NOTE — Patient Instructions (Signed)
CXR today We will perform full pulmonary function testing Dr Delton Coombes will call you with results and forward the info to Dr Eulah Pont

## 2011-04-19 NOTE — Progress Notes (Signed)
  Subjective:    Patient ID: [name], male    DOB: 10-16-1940, 70 y.o.   MRN: [id number]  HPI    Review of Systems  Constitutional: Negative for fever, activity change, appetite change and fatigue.  HENT: Negative for congestion, rhinorrhea, sneezing, postnasal drip and sinus pressure.   Respiratory: Negative for cough (baseline, in the am - prod clear/white), chest tightness, shortness of breath, wheezing and stridor.   Cardiovascular: Negative for chest pain.  Musculoskeletal: Negative for back pain.       Objective:   Physical Exam        Assessment & Plan:

## 2011-04-27 ENCOUNTER — Encounter: Payer: Self-pay | Admitting: *Deleted

## 2011-05-02 ENCOUNTER — Ambulatory Visit (HOSPITAL_COMMUNITY): Payer: Medicare Other | Attending: Cardiology | Admitting: Radiology

## 2011-05-02 DIAGNOSIS — Z87891 Personal history of nicotine dependence: Secondary | ICD-10-CM | POA: Insufficient documentation

## 2011-05-02 DIAGNOSIS — R0602 Shortness of breath: Secondary | ICD-10-CM | POA: Insufficient documentation

## 2011-05-02 DIAGNOSIS — R0989 Other specified symptoms and signs involving the circulatory and respiratory systems: Secondary | ICD-10-CM | POA: Insufficient documentation

## 2011-05-02 DIAGNOSIS — J449 Chronic obstructive pulmonary disease, unspecified: Secondary | ICD-10-CM | POA: Insufficient documentation

## 2011-05-02 DIAGNOSIS — R0789 Other chest pain: Secondary | ICD-10-CM | POA: Insufficient documentation

## 2011-05-02 DIAGNOSIS — Z01810 Encounter for preprocedural cardiovascular examination: Secondary | ICD-10-CM | POA: Insufficient documentation

## 2011-05-02 DIAGNOSIS — R0609 Other forms of dyspnea: Secondary | ICD-10-CM | POA: Insufficient documentation

## 2011-05-02 DIAGNOSIS — J4489 Other specified chronic obstructive pulmonary disease: Secondary | ICD-10-CM | POA: Insufficient documentation

## 2011-05-02 MED ORDER — TECHNETIUM TC 99M TETROFOSMIN IV KIT
11.0000 | PACK | Freq: Once | INTRAVENOUS | Status: AC | PRN
  Administered 2011-05-02: 11 via INTRAVENOUS

## 2011-05-02 MED ORDER — TECHNETIUM TC 99M TETROFOSMIN IV KIT
33.0000 | PACK | Freq: Once | INTRAVENOUS | Status: AC | PRN
  Administered 2011-05-02: 33 via INTRAVENOUS

## 2011-05-02 NOTE — Progress Notes (Signed)
[facility] SITE 3 NUCLEAR MED [address] [place] [address] [phone]  Cardiology Nuclear Med Study  [name] is a 70 y.o. male [id number] 1941/01/27   Nuclear Med Background Indication for Stress Test:  Evaluation for Ischemia and Pending Surgical Clearance 06/08/11-Dr. Eulah Pont History:  COPD Cardiac Risk Factors: History of Smoking  Symptoms:  Chest Pain, Chest Tightness, DOE and SOB   Nuclear Pre-Procedure Caffeine/Decaff Intake:  None NPO After: 10:00pm   Lungs: clear IV 0.9% NS with Angio Cath:  20g  IV Site: R Antecubital  IV Started by:  Stanton Kidney, EMT-P  Chest Size (in):  44 Cup Size: n/a  Height: 5\' 11"  (1.803 m)  Weight:  207 lb (93.895 kg)  BMI:  Body mass index is 28.87 kg/(m^2). Tech Comments:  NA    Nuclear Med Study 1 or 2 day study: 1 day  Stress Test Type:  Stress  Reading MD: Dietrich Pates, MD  Order Authorizing Provider:  P.Jordan  Resting Radionuclide: Technetium 57m Tetrofosmin  Resting Radionuclide Dose: 11 mCi   Stress Radionuclide:  Technetium 20m Tetrofosmin  Stress Radionuclide Dose: 33 mCi           Stress Protocol Rest HR: 64 Stress HR: 129  Rest BP: 120/60 Stress BP: 181/79  Exercise Time (min): 9:00 METS: 10.10   Predicted Max HR: 150 bpm % Max HR: 86 bpm Rate Pressure Product: 21308   Dose of Adenosine (mg):  n/a Dose of Lexiscan: n/a mg  Dose of Atropine (mg): n/a Dose of Dobutamine: n/a mcg/kg/min (at max HR)  Stress Test Technologist: Milana Na, EMT-P  Nuclear Technologist:  Domenic Polite, CNMT     Rest Procedure:  Myocardial perfusion imaging was performed at rest 45 minutes following the intravenous administration of Technetium 30m Tetrofosmin. Rest ECG: NSR  Stress Procedure:  The patient exercised for 9:00.  The patient stopped due to fatigue and denied any chest pain.  There were no significant ST-T wave changes and rare pacs/pvcs.  Technetium 51m Tetrofosmin was injected at peak  exercise and myocardial perfusion imaging was performed after a brief delay. Stress ECG: No significant change from baseline ECG  QPS Raw Data Images:  Soft tissue (diaphragm) underlies inferior wall. Stress Images:  Normal homogeneous uptake in all areas of the myocardium. Rest Images:  Normal homogeneous uptake in all areas of the myocardium. Subtraction (SDS):  No evidence of ischemia. Transient Ischemic Dilatation (Normal <1.22):  .89 Lung/Heart Ratio (Normal <0.45):  .39  Quantitative Gated Spect Images QGS EDV:  148 ml QGS ESV:  56 ml QGS cine images:  NL LV Function; NL Wall Motion QGS EF: 62%  Impression Exercise Capacity:  Good exercise capacity. BP Response:  Normal blood pressure response. Clinical Symptoms:  No chest pain. ECG Impression:  No significant ST segment change suggestive of ischemia. Comparison with Prior Nuclear Study:No prior studies to compare.  Overall Impression:  Normal stress nuclear study.  LVEF 62% Dietrich Pates

## 2011-05-03 ENCOUNTER — Ambulatory Visit (HOSPITAL_COMMUNITY)
Admission: RE | Admit: 2011-05-03 | Discharge: 2011-05-03 | Disposition: A | Discharge status: Home / Self Care | Payer: Medicare Other | Source: Ambulatory Visit | Attending: Emergency Medicine | Admitting: Emergency Medicine

## 2011-05-03 ENCOUNTER — Telehealth: Payer: Self-pay | Admitting: *Deleted

## 2011-05-03 ENCOUNTER — Telehealth: Payer: Self-pay | Admitting: Cardiology

## 2011-05-03 DIAGNOSIS — R0600 Dyspnea, unspecified: Secondary | ICD-10-CM

## 2011-05-03 DIAGNOSIS — R0609 Other forms of dyspnea: Secondary | ICD-10-CM | POA: Insufficient documentation

## 2011-05-03 DIAGNOSIS — Z87891 Personal history of nicotine dependence: Secondary | ICD-10-CM | POA: Insufficient documentation

## 2011-05-03 DIAGNOSIS — R0989 Other specified symptoms and signs involving the circulatory and respiratory systems: Secondary | ICD-10-CM | POA: Insufficient documentation

## 2011-05-03 DIAGNOSIS — R062 Wheezing: Secondary | ICD-10-CM | POA: Insufficient documentation

## 2011-05-03 LAB — PULMONARY FUNCTION TEST

## 2011-05-03 NOTE — Telephone Encounter (Signed)
Notified of Stress test results. Will clear for knee surgery. Will send copy to Dr. Lynelle Doctor and Dr. Mckinley Jewel

## 2011-05-03 NOTE — Telephone Encounter (Signed)
Pt rtn call to anita re stress test

## 2011-05-03 NOTE — Progress Notes (Signed)
lm

## 2011-05-03 NOTE — Telephone Encounter (Signed)
Message copied by Lorayne Bender on Tue May 03, 2011  1:06 PM ------      Message from: [place], PETER M      Created: Tue May 03, 2011  9:48 AM       Stress test is normal. Normal EF. He is cleared for knee surgery.       Theron Arista [place]

## 2011-05-12 ENCOUNTER — Telehealth: Payer: Self-pay | Admitting: *Deleted

## 2011-05-12 ENCOUNTER — Telehealth: Payer: Self-pay | Admitting: Emergency Medicine

## 2011-05-12 NOTE — Telephone Encounter (Signed)
Patient called requesting the results of his PFT's done 05/03/11 at [facility], are you able to view this result? Thanks.

## 2011-05-12 NOTE — Telephone Encounter (Signed)
I see it under "Procedures", but am unable to see any actual results or report anywhere in the system.  He will need to call pulmonary directly for results.  No paper results were sent to chart either.

## 2011-05-12 NOTE — Telephone Encounter (Signed)
Spoke with patient and relayed message, gave him [facility] number, he will call them directly.

## 2011-05-13 NOTE — Telephone Encounter (Signed)
I have called for the PFT results done @ [facility] on 10/23. Pt is aware we had not received these results and that I will have Dr. Delton Coombes review once he is back on Mon. 11/5. Pt would also like for the results to be shared with his PCP, Dr. Joselyn Arrow. Results received and placed in RB look at.

## 2011-05-13 NOTE — Telephone Encounter (Signed)
Patient calling back asking for PFT results done 05/03/11.  [phone]

## 2011-05-17 NOTE — Telephone Encounter (Signed)
Discussed results w patient - spiro with normal numbers, curve suggests mild AFL

## 2011-05-17 NOTE — Telephone Encounter (Signed)
Pt calling back for PFT results. Pt aware I will have RB look at the results this afternoon. Pt would like a callback on his mobile # [phone]

## 2011-05-18 ENCOUNTER — Telehealth: Payer: Self-pay | Admitting: Cardiology

## 2011-05-18 NOTE — Telephone Encounter (Signed)
Called wanting to know if stress test showed his heart valves. Advised it did not that an Echo would evaluate the valves. The stress test was nml and according to Dr. Elvis Coil note there is not an indication to do an Echo. He verbalizes understanding.

## 2011-05-18 NOTE — Telephone Encounter (Signed)
Pt calling wanting to know if Stress test shows any other heart valve abnormalities. Please return pt call to discuss further.

## 2011-05-27 ENCOUNTER — Encounter (HOSPITAL_COMMUNITY): Payer: Self-pay | Admitting: Pharmacy Technician

## 2011-05-31 ENCOUNTER — Encounter (HOSPITAL_COMMUNITY)
Admission: RE | Admit: 2011-05-31 | Discharge: 2011-05-31 | Disposition: A | Discharge status: Home / Self Care | Payer: Medicare Other | Source: Ambulatory Visit | Attending: Orthopedic Surgery | Admitting: Orthopedic Surgery

## 2011-05-31 ENCOUNTER — Encounter (HOSPITAL_COMMUNITY): Payer: Self-pay

## 2011-05-31 LAB — URINALYSIS, ROUTINE W REFLEX MICROSCOPIC
Ketones, ur: NEGATIVE mg/dL
Leukocytes, UA: NEGATIVE
Nitrite: NEGATIVE
Protein, ur: NEGATIVE mg/dL

## 2011-05-31 LAB — COMPREHENSIVE METABOLIC PANEL
ALT: 17 U/L (ref 0–53)
AST: 25 U/L (ref 0–37)
CO2: 27 mEq/L (ref 19–32)
Calcium: 8.6 mg/dL (ref 8.4–10.5)
Creatinine, Ser: 0.88 mg/dL (ref 0.50–1.35)
GFR calc Af Amer: >90 mL/min (ref >90)
GFR calc non Af Amer: 85 mL/min — ABNORMAL LOW (ref >90)
Glucose, Bld: 97 mg/dL (ref 70–99)
Sodium: 131 mEq/L — ABNORMAL LOW (ref 135–145)
Total Protein: 13.9 g/dL — ABNORMAL HIGH (ref 6.0–8.3)

## 2011-05-31 LAB — SURGICAL PCR SCREEN: MRSA, PCR: NEGATIVE

## 2011-05-31 LAB — PROTIME-INR: INR: 1.17 (ref 0.00–1.49)

## 2011-05-31 LAB — CBC
MCH: 32.5 pg (ref 26.0–34.0)
MCHC: 34.7 g/dL (ref 30.0–36.0)
MCV: 93.5 fL (ref 78.0–100.0)
Platelets: 195 10*3/uL (ref 150–400)
RBC: 3.54 MIL/uL — ABNORMAL LOW (ref 4.22–5.81)

## 2011-05-31 MED ORDER — CHLORHEXIDINE GLUCONATE 4 % EX LIQD: 60.0000 mL | Freq: Once | CUTANEOUS | Status: DC

## 2011-05-31 NOTE — Progress Notes (Signed)
Per Bonita Quin in Blood bank will need to obtain a new ABO/ RH on day of surgery d/t having previously documented one blood type and then getting a different blood type from today's specimen. Order placed in computer and note made on chart.

## 2011-05-31 NOTE — Pre-Procedure Instructions (Signed)
20 [name]  05/31/2011   Your procedure is scheduled on:  June 08, 2011  Report to [facility] at 0630 AM.  Call this number if you have problems the morning of surgery: [phone]   Remember:   Do not eat food:After Midnight.  Do not drink clear liquids: 4 Hours before arrival.  Take these medicines the morning of surgery with A SIP OF WATER: Albuterol   Do not wear jewelry, make-up or nail polish.  Do not wear lotions, powders, or perfumes. You may wear deodorant.  Do not shave 48 hours prior to surgery.  Do not bring valuables to the hospital.  Contacts, dentures or bridgework may not be worn into surgery.  Leave suitcase in the car. After surgery it may be brought to your room.  For patients admitted to the hospital, checkout time is 11:00 AM the day of discharge.   Patients discharged the day of surgery will not be allowed to drive home.  Special Instructions: Incentive Spirometry - Practice and bring it with you on the day of surgery. and CHG Shower Use Special Wash: 1/2 bottle night before surgery and 1/2 bottle morning of surgery.   Please read over the following fact sheets that you were given: Pain Booklet, Coughing and Deep Breathing, Blood Transfusion Information, Total Joint Packet and Surgical Site Infection Prevention

## 2011-06-06 NOTE — H&P (Signed)
  [facility] [address]   [address] [place], [place] [address] [phone] A Division of [facility]  Loreta Ave, M.D.     Robert A. Thurston Hole, M.D.     Lunette Stands, M.D. Eulas Post, M.D.    Buford Dresser, M.D. Estell Harpin, M.D. Ralene Cork, D.O.          Genene Churn. Barry Dienes, PA-C            Kirstin A. Shepperson, PA-C Janace Litten, OPA-C   RE: [name], [name]                                [phone]      DOB: [birth year] PROGRESS NOTE: 05-31-11 Chief complaint: Right knee pain.  History of present illness: 70 year-old white male with a history of end stage DJD, right knee, and chronic pain.  Returns.  States that symptoms are unchanged from previous visit.  He is wanting to proceed with total knee replacement as scheduled next week.  We received pre-op clearances from Dr. Joselyn Arrow and Dr. Peter [place].   Current medications: Pantoprazole and Hydrocodone. Allergies: Penicillin.    Past medical/surgical history: Appendectomy, hernia repair, left total knee replacement and GERD. Review of systems: All other systems are unremarkable.  Family history: Positive for coronary artery disease, hypertension, diabetes and stroke. Social history: Quit smoking in 1980, had smoked 2 packs per day x 20 years, admits occasional alcohol consumption.  Patient is married and is employed as a Production designer, theatre/television/film.       EXAMINATION: Height: 5?8.  Weight: 206 pounds.  Temperature: 98.4.  Pulse: 72.  Blood pressure: 129/77.  Respirations: 16.  Pleasant white male, alert and oriented x 3 and in no acute distress.  Head is normocephalic, a traumatic.  PERRLA, EOMI.  Cervical spine unremarkable.  Lungs: CTA bilaterally.  No wheezes.  Heart: RRR.  S1 and S2.  No murmurs.  Abdomen: Round and non-distended.  NBS x 4.  Soft and non-tender.  Right knee: Decreased range of motion.  Joint line tender.  Positive effusion.   Positive crepitus.  Ligaments stable.  Calf non-tender.  Neurovascularly intact.  Skin warm and dry.      IMPRESSION: End stage DJD, right knee, and chronic pain.  Failed conservative treatment.  PLAN:  We will proceed with right total knee replacement as scheduled next week.  Surgical procedure along with potential risks and complications discussed with him in detail.  All questions answered.    Loreta Ave, M.D.   Electronically verified by Loreta Ave, M.D. DFM(JMO):jjh D 05-31-11 T 06-01-11

## 2011-06-07 MED ORDER — VANCOMYCIN HCL IN DEXTROSE 1-5 GM/200ML-% IV SOLN
1000.0000 mg | INTRAVENOUS | Status: AC
  Administered 2011-06-08: 1000 mg via INTRAVENOUS
  Filled 2011-06-07 (×2): qty 200

## 2011-06-08 ENCOUNTER — Inpatient Hospital Stay (HOSPITAL_COMMUNITY)
Admission: RE | Admit: 2011-06-08 | Discharge: 2011-06-10 | DRG: 470 | Disposition: A | Discharge status: Home / Self Care | Payer: Medicare Other | Source: Ambulatory Visit | Attending: Orthopedic Surgery | Admitting: Orthopedic Surgery

## 2011-06-08 ENCOUNTER — Inpatient Hospital Stay (HOSPITAL_COMMUNITY): Payer: Medicare Other | Admitting: Certified Registered"

## 2011-06-08 ENCOUNTER — Encounter (HOSPITAL_COMMUNITY): Payer: Self-pay | Admitting: Certified Registered"

## 2011-06-08 ENCOUNTER — Inpatient Hospital Stay (HOSPITAL_COMMUNITY): Payer: Medicare Other

## 2011-06-08 ENCOUNTER — Encounter (HOSPITAL_COMMUNITY): Payer: Self-pay | Admitting: *Deleted

## 2011-06-08 ENCOUNTER — Encounter (HOSPITAL_COMMUNITY)
Admission: RE | Disposition: A | Discharge status: Home / Self Care | Payer: Self-pay | Source: Ambulatory Visit | Attending: Orthopedic Surgery

## 2011-06-08 DIAGNOSIS — M1710 Unilateral primary osteoarthritis, unspecified knee: Principal | ICD-10-CM | POA: Diagnosis present

## 2011-06-08 DIAGNOSIS — Z8249 Family history of ischemic heart disease and other diseases of the circulatory system: Secondary | ICD-10-CM

## 2011-06-08 DIAGNOSIS — E871 Hypo-osmolality and hyponatremia: Secondary | ICD-10-CM | POA: Diagnosis not present

## 2011-06-08 DIAGNOSIS — Z96659 Presence of unspecified artificial knee joint: Secondary | ICD-10-CM

## 2011-06-08 DIAGNOSIS — Z833 Family history of diabetes mellitus: Secondary | ICD-10-CM

## 2011-06-08 DIAGNOSIS — N400 Enlarged prostate without lower urinary tract symptoms: Secondary | ICD-10-CM | POA: Diagnosis present

## 2011-06-08 DIAGNOSIS — K5730 Diverticulosis of large intestine without perforation or abscess without bleeding: Secondary | ICD-10-CM | POA: Diagnosis present

## 2011-06-08 DIAGNOSIS — G8929 Other chronic pain: Secondary | ICD-10-CM | POA: Diagnosis present

## 2011-06-08 DIAGNOSIS — M19049 Primary osteoarthritis, unspecified hand: Secondary | ICD-10-CM | POA: Diagnosis present

## 2011-06-08 DIAGNOSIS — K589 Irritable bowel syndrome without diarrhea: Secondary | ICD-10-CM | POA: Diagnosis present

## 2011-06-08 DIAGNOSIS — Z823 Family history of stroke: Secondary | ICD-10-CM

## 2011-06-08 DIAGNOSIS — Z87891 Personal history of nicotine dependence: Secondary | ICD-10-CM

## 2011-06-08 DIAGNOSIS — D62 Acute posthemorrhagic anemia: Secondary | ICD-10-CM | POA: Diagnosis not present

## 2011-06-08 DIAGNOSIS — Z85820 Personal history of malignant melanoma of skin: Secondary | ICD-10-CM

## 2011-06-08 DIAGNOSIS — Z860100 Personal history of colon polyps, unspecified: Secondary | ICD-10-CM

## 2011-06-08 DIAGNOSIS — N411 Chronic prostatitis: Secondary | ICD-10-CM | POA: Diagnosis present

## 2011-06-08 DIAGNOSIS — Z880 Allergy status to penicillin: Secondary | ICD-10-CM

## 2011-06-08 DIAGNOSIS — K219 Gastro-esophageal reflux disease without esophagitis: Secondary | ICD-10-CM | POA: Diagnosis present

## 2011-06-08 HISTORY — PX: TOTAL KNEE ARTHROPLASTY: SHX125

## 2011-06-08 LAB — ABO/RH: ABO/RH(D): O POS

## 2011-06-08 SURGERY — ARTHROPLASTY, KNEE, TOTAL
Anesthesia: General | Site: Knee | Laterality: Right | Wound class: Clean

## 2011-06-08 MED ORDER — HYDROMORPHONE HCL PF 1 MG/ML IJ SOLN
INTRAMUSCULAR | Status: AC
  Administered 2011-06-08: 0.5 mg via INTRAVENOUS
  Filled 2011-06-08: qty 1

## 2011-06-08 MED ORDER — ONDANSETRON HCL 4 MG/2ML IJ SOLN: 4.0000 mg | Freq: Four times a day (QID) | INTRAMUSCULAR | Status: DC | PRN

## 2011-06-08 MED ORDER — LACTATED RINGERS IV SOLN: INTRAVENOUS | Status: DC

## 2011-06-08 MED ORDER — MORPHINE SULFATE 4 MG/ML IJ SOLN
INTRAMUSCULAR | Status: DC | PRN
  Administered 2011-06-08: 4 mg

## 2011-06-08 MED ORDER — WARFARIN VIDEO: Freq: Once | Status: DC

## 2011-06-08 MED ORDER — HYDROMORPHONE HCL PF 1 MG/ML IJ SOLN
0.2500 mg | INTRAMUSCULAR | Status: DC | PRN
  Administered 2011-06-08 (×4): 0.5 mg via INTRAVENOUS

## 2011-06-08 MED ORDER — WARFARIN SODIUM 7.5 MG PO TABS
7.5000 mg | ORAL_TABLET | Freq: Once | ORAL | Status: AC
  Administered 2011-06-08: 7.5 mg via ORAL
  Filled 2011-06-08: qty 1

## 2011-06-08 MED ORDER — ROCURONIUM BROMIDE 100 MG/10ML IV SOLN
INTRAVENOUS | Status: DC | PRN
  Administered 2011-06-08: 40 mg via INTRAVENOUS

## 2011-06-08 MED ORDER — DIPHENHYDRAMINE HCL 50 MG/ML IJ SOLN: 12.5000 mg | Freq: Four times a day (QID) | INTRAMUSCULAR | Status: DC | PRN

## 2011-06-08 MED ORDER — DIPHENHYDRAMINE HCL 12.5 MG/5ML PO ELIX
12.5000 mg | ORAL_SOLUTION | Freq: Four times a day (QID) | ORAL | Status: DC | PRN
  Filled 2011-06-08: qty 5

## 2011-06-08 MED ORDER — ACETAMINOPHEN 10 MG/ML IV SOLN
INTRAVENOUS | Status: DC | PRN
  Administered 2011-06-08: 1000 mg via INTRAVENOUS

## 2011-06-08 MED ORDER — PROPOFOL 10 MG/ML IV EMUL
INTRAVENOUS | Status: DC | PRN
  Administered 2011-06-08: 200 mg via INTRAVENOUS

## 2011-06-08 MED ORDER — SODIUM CHLORIDE 0.9 % IR SOLN
Status: DC | PRN
  Administered 2011-06-08: 1000 mL

## 2011-06-08 MED ORDER — MENTHOL 3 MG MT LOZG: 1.0000 | LOZENGE | OROMUCOSAL | Status: DC | PRN

## 2011-06-08 MED ORDER — NEOSTIGMINE METHYLSULFATE 1 MG/ML IJ SOLN
INTRAMUSCULAR | Status: DC | PRN
  Administered 2011-06-08: 3 mg via INTRAVENOUS

## 2011-06-08 MED ORDER — DOCUSATE SODIUM 100 MG PO CAPS
100.0000 mg | ORAL_CAPSULE | Freq: Two times a day (BID) | ORAL | Status: DC
  Administered 2011-06-08 – 2011-06-10 (×4): 100 mg via ORAL
  Filled 2011-06-08 (×4): qty 1

## 2011-06-08 MED ORDER — OXYCODONE-ACETAMINOPHEN 5-325 MG PO TABS
1.0000 | ORAL_TABLET | ORAL | Status: DC | PRN
  Administered 2011-06-09 – 2011-06-10 (×6): 2 via ORAL
  Filled 2011-06-08 (×6): qty 2

## 2011-06-08 MED ORDER — ONDANSETRON HCL 4 MG/2ML IJ SOLN: 4.0000 mg | Freq: Once | INTRAMUSCULAR | Status: DC | PRN

## 2011-06-08 MED ORDER — MORPHINE SULFATE (PF) 1 MG/ML IV SOLN
INTRAVENOUS | Status: DC
  Administered 2011-06-08: 5 mg via INTRAVENOUS
  Administered 2011-06-08: 12:00:00 via INTRAVENOUS
  Administered 2011-06-08: 4 mg via INTRAVENOUS
  Administered 2011-06-09: 03:00:00 via INTRAVENOUS
  Administered 2011-06-09: 7 mg via INTRAVENOUS
  Administered 2011-06-09: 9 mg via INTRAVENOUS
  Administered 2011-06-09: 7 mg via INTRAVENOUS
  Filled 2011-06-08 (×2): qty 25

## 2011-06-08 MED ORDER — METHOCARBAMOL 100 MG/ML IJ SOLN
500.0000 mg | Freq: Four times a day (QID) | INTRAVENOUS | Status: DC | PRN
  Administered 2011-06-08: 500 mg via INTRAVENOUS
  Filled 2011-06-08: qty 5

## 2011-06-08 MED ORDER — FENTANYL CITRATE 0.05 MG/ML IJ SOLN
INTRAMUSCULAR | Status: DC | PRN
  Administered 2011-06-08: 25 ug via INTRAVENOUS
  Administered 2011-06-08: 50 ug via INTRAVENOUS
  Administered 2011-06-08: 25 ug via INTRAVENOUS
  Administered 2011-06-08 (×2): 50 ug via INTRAVENOUS

## 2011-06-08 MED ORDER — POTASSIUM CHLORIDE IN NACL 20-0.45 MEQ/L-% IV SOLN
INTRAVENOUS | Status: DC
  Administered 2011-06-08: 17:00:00 via INTRAVENOUS
  Administered 2011-06-09: 1000 mL via INTRAVENOUS
  Filled 2011-06-08 (×2): qty 1000

## 2011-06-08 MED ORDER — METHOCARBAMOL 500 MG PO TABS
500.0000 mg | ORAL_TABLET | Freq: Four times a day (QID) | ORAL | Status: DC | PRN
  Administered 2011-06-09 – 2011-06-10 (×6): 500 mg via ORAL
  Filled 2011-06-08 (×7): qty 1

## 2011-06-08 MED ORDER — ACETAMINOPHEN 10 MG/ML IV SOLN
INTRAVENOUS | Status: AC
  Administered 2011-06-08: 1000 mg via INTRAVENOUS
  Filled 2011-06-08: qty 100

## 2011-06-08 MED ORDER — PATIENT'S GUIDE TO USING COUMADIN BOOK
Freq: Once | Status: AC
  Administered 2011-06-08: 15:00:00
  Filled 2011-06-08: qty 1

## 2011-06-08 MED ORDER — MIDAZOLAM HCL 5 MG/5ML IJ SOLN
INTRAMUSCULAR | Status: DC | PRN
  Administered 2011-06-08: 2 mg via INTRAVENOUS

## 2011-06-08 MED ORDER — VANCOMYCIN HCL IN DEXTROSE 1-5 GM/200ML-% IV SOLN
1000.0000 mg | INTRAVENOUS | Status: AC
  Administered 2011-06-08: 1000 mg via INTRAVENOUS
  Filled 2011-06-08: qty 200

## 2011-06-08 MED ORDER — ALBUTEROL SULFATE HFA 108 (90 BASE) MCG/ACT IN AERS
2.0000 | INHALATION_SPRAY | Freq: Four times a day (QID) | RESPIRATORY_TRACT | Status: DC | PRN
  Filled 2011-06-08: qty 6.7

## 2011-06-08 MED ORDER — LACTATED RINGERS IV SOLN
INTRAVENOUS | Status: DC | PRN
  Administered 2011-06-08 (×2): via INTRAVENOUS

## 2011-06-08 MED ORDER — ACETAMINOPHEN 10 MG/ML IV SOLN
INTRAVENOUS | Status: AC
  Filled 2011-06-08: qty 100

## 2011-06-08 MED ORDER — BUPIVACAINE HCL (PF) 0.25 % IJ SOLN
INTRAMUSCULAR | Status: DC | PRN
  Administered 2011-06-08: 30 mL

## 2011-06-08 MED ORDER — ACETAMINOPHEN 325 MG PO TABS: 650.0000 mg | ORAL_TABLET | Freq: Four times a day (QID) | ORAL | Status: DC | PRN

## 2011-06-08 MED ORDER — NALOXONE HCL 0.4 MG/ML IJ SOLN: 0.4000 mg | INTRAMUSCULAR | Status: DC | PRN

## 2011-06-08 MED ORDER — ONDANSETRON HCL 4 MG PO TABS: 4.0000 mg | ORAL_TABLET | Freq: Four times a day (QID) | ORAL | Status: DC | PRN

## 2011-06-08 MED ORDER — PHENOL 1.4 % MT LIQD
1.0000 | OROMUCOSAL | Status: DC | PRN
  Filled 2011-06-08: qty 177

## 2011-06-08 MED ORDER — ENOXAPARIN SODIUM 30 MG/0.3ML ~~LOC~~ SOLN
30.0000 mg | Freq: Two times a day (BID) | SUBCUTANEOUS | Status: DC
  Administered 2011-06-09 – 2011-06-10 (×3): 30 mg via SUBCUTANEOUS
  Filled 2011-06-08 (×5): qty 0.3

## 2011-06-08 MED ORDER — PANTOPRAZOLE SODIUM 40 MG PO TBEC
40.0000 mg | DELAYED_RELEASE_TABLET | Freq: Every day | ORAL | Status: DC
  Administered 2011-06-08 – 2011-06-10 (×3): 40 mg via ORAL
  Filled 2011-06-08 (×2): qty 1

## 2011-06-08 MED ORDER — GLYCOPYRROLATE 0.2 MG/ML IJ SOLN
INTRAMUSCULAR | Status: DC | PRN
  Administered 2011-06-08: .4 mg via INTRAVENOUS

## 2011-06-08 MED ORDER — SODIUM CHLORIDE 0.9 % IJ SOLN: 9.0000 mL | INTRAMUSCULAR | Status: DC | PRN

## 2011-06-08 MED ORDER — BUPIVACAINE-EPINEPHRINE PF 0.5-1:200000 % IJ SOLN
INTRAMUSCULAR | Status: DC | PRN
  Administered 2011-06-08: 30 mL

## 2011-06-08 MED ORDER — ACETAMINOPHEN 10 MG/ML IV SOLN
1000.0000 mg | Freq: Four times a day (QID) | INTRAVENOUS | Status: DC
  Administered 2011-06-08: 1000 mg via INTRAVENOUS

## 2011-06-08 MED ORDER — ACETAMINOPHEN 650 MG RE SUPP
650.0000 mg | Freq: Four times a day (QID) | RECTAL | Status: DC | PRN
  Filled 2011-06-08: qty 1

## 2011-06-08 MED ORDER — ONDANSETRON HCL 4 MG/2ML IJ SOLN
INTRAMUSCULAR | Status: DC | PRN
  Administered 2011-06-08: 4 mg via INTRAVENOUS

## 2011-06-08 SURGICAL SUPPLY — 55 items
BANDAGE ESMARK 6X9 LF (GAUZE/BANDAGES/DRESSINGS) ×1 IMPLANT
BLADE SAG 18X100X1.27 (BLADE) ×4 IMPLANT
BNDG CMPR 9X6 STRL LF SNTH (GAUZE/BANDAGES/DRESSINGS) ×1
BNDG ESMARK 6X9 LF (GAUZE/BANDAGES/DRESSINGS) ×2
BOOTCOVER CLEANROOM LRG (PROTECTIVE WEAR) ×4 IMPLANT
BOWL SMART MIX CTS (DISPOSABLE) ×2 IMPLANT
CEMENT BONE SIMPLEX SPEEDSET (Cement) ×4 IMPLANT
CLOTH BEACON ORANGE TIMEOUT ST (SAFETY) ×2 IMPLANT
COVER BACK TABLE 24X17X13 BIG (DRAPES) ×1 IMPLANT
COVER SURGICAL LIGHT HANDLE (MISCELLANEOUS) ×2 IMPLANT
CUFF TOURNIQUET SINGLE 34IN LL (TOURNIQUET CUFF) ×2 IMPLANT
DRAPE EXTREMITY T 121X128X90 (DRAPE) ×2 IMPLANT
DRAPE PROXIMA HALF (DRAPES) ×2 IMPLANT
DRAPE U-SHAPE 47X51 STRL (DRAPES) ×2 IMPLANT
DRSG PAD ABDOMINAL 8X10 ST (GAUZE/BANDAGES/DRESSINGS) ×2 IMPLANT
DURAPREP 26ML APPLICATOR (WOUND CARE) ×2 IMPLANT
ELECT CAUTERY BLADE 6.4 (BLADE) ×2 IMPLANT
ELECT REM PT RETURN 9FT ADLT (ELECTROSURGICAL) ×2
ELECTRODE REM PT RTRN 9FT ADLT (ELECTROSURGICAL) ×1 IMPLANT
EVACUATOR 1/8 PVC DRAIN (DRAIN) ×2 IMPLANT
FACESHIELD LNG OPTICON STERILE (SAFETY) ×2 IMPLANT
GAUZE XEROFORM 5X9 LF (GAUZE/BANDAGES/DRESSINGS) ×2 IMPLANT
GLOVE BIOGEL PI IND STRL 8 (GLOVE) ×1 IMPLANT
GLOVE BIOGEL PI INDICATOR 8 (GLOVE) ×1
GOWN PREVENTION PLUS XLARGE (GOWN DISPOSABLE) ×4 IMPLANT
GOWN STRL NON-REIN LRG LVL3 (GOWN DISPOSABLE) ×4 IMPLANT
GOWN STRL REIN 2XL LVL4 (GOWN DISPOSABLE) ×2 IMPLANT
HANDPIECE INTERPULSE COAX TIP (DISPOSABLE) ×2
IMMOBILIZER KNEE 22 UNIV (SOFTGOODS) ×2 IMPLANT
IMMOBILIZER KNEE 24 THIGH 36 (MISCELLANEOUS) IMPLANT
IMMOBILIZER KNEE 24 UNIV (MISCELLANEOUS)
KIT BASIN OR (CUSTOM PROCEDURE TRAY) ×2 IMPLANT
KIT ROOM TURNOVER OR (KITS) ×2 IMPLANT
MANIFOLD NEPTUNE II (INSTRUMENTS) ×2 IMPLANT
NS IRRIG 1000ML POUR BTL (IV SOLUTION) ×2 IMPLANT
PACK TOTAL JOINT (CUSTOM PROCEDURE TRAY) ×2 IMPLANT
PAD ARMBOARD 7.5X6 YLW CONV (MISCELLANEOUS) ×2 IMPLANT
PAD CAST 4YDX4 CTTN HI CHSV (CAST SUPPLIES) ×1 IMPLANT
PADDING CAST COTTON 4X4 STRL (CAST SUPPLIES) ×2
PADDING CAST COTTON 6X4 STRL (CAST SUPPLIES) ×2 IMPLANT
RUBBERBAND STERILE (MISCELLANEOUS) ×2 IMPLANT
SET HNDPC FAN SPRY TIP SCT (DISPOSABLE) ×1 IMPLANT
SPONGE GAUZE 4X4 12PLY (GAUZE/BANDAGES/DRESSINGS) ×2 IMPLANT
STAPLER VISISTAT 35W (STAPLE) ×2 IMPLANT
SUCTION FRAZIER TIP 10 FR DISP (SUCTIONS) ×2 IMPLANT
SUT VIC AB 1 CTX 36 (SUTURE) ×4
SUT VIC AB 1 CTX36XBRD ANBCTR (SUTURE) ×2 IMPLANT
SUT VIC AB 2-0 CT1 27 (SUTURE) ×4
SUT VIC AB 2-0 CT1 TAPERPNT 27 (SUTURE) ×2 IMPLANT
SYR 30ML LL (SYRINGE) ×2 IMPLANT
SYR 30ML SLIP (SYRINGE) ×2 IMPLANT
TOWEL OR 17X24 6PK STRL BLUE (TOWEL DISPOSABLE) ×2 IMPLANT
TOWEL OR 17X26 10 PK STRL BLUE (TOWEL DISPOSABLE) ×2 IMPLANT
TRAY FOLEY CATH 14FR (SET/KITS/TRAYS/PACK) ×2 IMPLANT
WATER STERILE IRR 1000ML POUR (IV SOLUTION) ×6 IMPLANT

## 2011-06-08 NOTE — Progress Notes (Signed)
ANTICOAGULATION CONSULT NOTE - Initial Consult  Pharmacy Consult for Coumadin  Indication: VTE prophylaxis  Medical History: Past Medical History  Diagnosis Date  . GERD (gastroesophageal reflux disease)     (Dr. Russella Dar)  . Colon polyp     (Dr. Russella Dar)  . Arthritis     Osteoarthritis right knee  . Benign prostatic hypertrophy     (Dr. Vernie Ammons)  . History of chronic prostatitis   . History of melanoma     Back (Dr. Karlyn Agee); early melanoma R arm 03/2011  . Diverticulosis   . Arthritis of hand, right     Right thumb (Dr. Teressa Senter)    Assessment: 35 YOM s/p E total knee arthroplasty (POD#0) to start coumadin for VTE prophylaxis. Baseline INR 1.17 (on 11/20)  Goal of Therapy:  INR 2-3   Plan:  1) coumadin 7.5mg  po x1 2) f/u daily PT/INR  [name] 06/08/2011,2:14 PM

## 2011-06-08 NOTE — Preoperative (Signed)
Beta Blockers   Reason not to administer Beta Blockers:Not Applicable 

## 2011-06-08 NOTE — H&P (Signed)
[name] is an 70 y.o. male.   Chief Complaint:right knee pain HPI:End stage DJD right knee  Past Medical History  Diagnosis Date  . GERD (gastroesophageal reflux disease)     (Dr. Russella Dar)  . Colon polyp     (Dr. Russella Dar)  . Arthritis     Osteoarthritis right knee  . Benign prostatic hypertrophy     (Dr. Vernie Ammons)  . History of chronic prostatitis   . History of melanoma     Back (Dr. Karlyn Agee); early melanoma R arm 03/2011  . Diverticulosis   . Arthritis of hand, right     Right thumb (Dr. Teressa Senter)    Past Surgical History  Procedure Date  . Total knee replaced 2000    Left knee  . Appendectomy   . Colonoscopy 2009  . Esophagogastroduodenoscopy 2009    mild inflammation at esophagogastric junction  . Excision of melanoma     back ( x3)  . Great toe surgery     bilateral  . Inguinal hernia repair     left, x 2    Family History  Problem Relation Age of Onset  . Dementia Mother   . Stroke Father   . Hypertension Father   . Diabetes Father   . Prostate cancer Maternal Grandfather   . Anesthesia problems Neg Hx    Social History:  reports that he quit smoking about 34 years ago. He has never used smokeless tobacco. He reports that he drinks about 1.5 ounces of alcohol per week. He reports that he does not use illicit drugs.  Allergies:  Allergies  Allergen Reactions  . Penicillins     REACTION: rash    Medications Prior to Admission  Medication Dose Route Frequency Provider Last Rate Last Dose  . vancomycin (VANCOCIN) IVPB 1000 mg/200 mL premix  1,000 mg Intravenous 60 min Pre-Op Naida Sleight, PA       Medications Prior to Admission  Medication Sig Dispense Refill  . Probiotic Product (ALIGN PO) Take 1 capsule by mouth daily.        Marland Kitchen albuterol (PROVENTIL HFA;VENTOLIN HFA) 108 (90 BASE) MCG/ACT inhaler Inhale 2 puffs into the lungs every 6 (six) hours as needed for wheezing or shortness of breath (may also use 20 minutes prior to exercise).  18 g  1  .  pantoprazole (PROTONIX) 40 MG tablet Take 40 mg by mouth daily.          Results for orders placed during the hospital encounter of 06/08/11 (from the past 48 hour(s))  ABO/RH     Status: Normal   Collection Time   06/08/11  6:45 AM      Component Value Range Comment   ABO/RH(D) O POS      No results found.  ROS  Blood pressure 129/74, pulse 64, temperature 98.1 F (36.7 C), temperature source Oral, resp. rate 20, height 5' 9.02" (1.753 m), weight 93.1 kg (205 lb 4 oz), SpO2 95.00%. Physical Exam   Assessment/Plan End stage DJD right. Plan Right TKR. Patient seen and examined, no changes  Legend Pecore F 06/08/2011, 8:21 AM

## 2011-06-08 NOTE — Anesthesia Preprocedure Evaluation (Addendum)
Anesthesia Evaluation  Patient identified by MRN, date of birth, ID band Patient awake    Reviewed: Allergy & Precautions, H&P , NPO status , Patient's Chart, lab work & pertinent test results, reviewed documented beta blocker date and time   Airway Mallampati: II TM Distance: >3 FB Neck ROM: Full    Dental  (+) Teeth Intact   Pulmonary  clear to auscultation        Cardiovascular Regular Normal    Neuro/Psych    GI/Hepatic GERD-  Controlled and Medicated,  Endo/Other    Renal/GU      Musculoskeletal   Abdominal   Peds  Hematology   Anesthesia Other Findings   Reproductive/Obstetrics                          Anesthesia Physical Anesthesia Plan  ASA: II  Anesthesia Plan: General   Post-op Pain Management:    Induction: Intravenous  Airway Management Planned: LMA  Additional Equipment:   Intra-op Plan:   Post-operative Plan:   Informed Consent: I have reviewed the patients History and Physical, chart, labs and discussed the procedure including the risks, benefits and alternatives for the proposed anesthesia with the patient or authorized representative who has indicated his/her understanding and acceptance.   Dental advisory given  Plan Discussed with: CRNA and Surgeon  Anesthesia Plan Comments:         Anesthesia Quick Evaluation

## 2011-06-08 NOTE — Brief Op Note (Signed)
06/08/2011  10:45 AM  PATIENT:  Jonelle Sidle  70 y.o. male  PRE-OPERATIVE DIAGNOSIS:  Degenerative joint disease right knee  POST-OPERATIVE DIAGNOSIS:  degenerative joint disease right knee  PROCEDURE:  Procedure(s): TOTAL KNEE ARTHROPLASTY  SURGEON:  Surgeon(s): Loreta Ave, MD  PHYSICIAN ASSISTANT: Zonia Kief M     ANESTHESIA:   regional and general  EBL:  Total I/O In: 1350 [I.V.:1350] Out: 650 [Urine:600; Blood:50]  BLOOD ADMINISTERED:none  DRAINS: hemovac    SPECIMEN:  No Specimen  DISPOSITION OF SPECIMEN:  N/A  COUNTS:  YES  TOURNIQUET:   Total Tourniquet Time Documented: Thigh (Right) - 84 minutes   PATIENT DISPOSITION:  PACU - hemodynamically stable.

## 2011-06-08 NOTE — Anesthesia Postprocedure Evaluation (Signed)
  Anesthesia Post-op Note  Patient: [name]  Procedure(s) Performed:  TOTAL KNEE ARTHROPLASTY - osteonics  Patient Location: PACU  Anesthesia Type: General  Level of Consciousness: awake, alert  and oriented  Airway and Oxygen Therapy: Patient Spontanous Breathing and Patient connected to nasal cannula oxygen  Post-op Pain: moderate  Post-op Assessment: Post-op Vital signs reviewed and Patient's Cardiovascular Status Stable  Post-op Vital Signs: stable  Complications: No apparent anesthesia complications

## 2011-06-08 NOTE — Anesthesia Postprocedure Evaluation (Signed)
  Anesthesia Post-op Note  Patient: [name]  Procedure(s) Performed:  TOTAL KNEE ARTHROPLASTY - osteonics  Patient Location: PACU  Anesthesia Type: General  Level of Consciousness: awake, alert  and oriented  Airway and Oxygen Therapy: Patient Spontanous Breathing  Post-op Pain: moderate  Post-op Assessment: Post-op Vital signs reviewed and Patient's Cardiovascular Status Stable  Post-op Vital Signs: stable  Complications: No apparent anesthesia complications

## 2011-06-08 NOTE — Transfer of Care (Signed)
Immediate Anesthesia Transfer of Care Note  Patient: [name]  Procedure(s) Performed:  TOTAL KNEE ARTHROPLASTY - osteonics  Patient Location: PACU  Anesthesia Type: General  Level of Consciousness: awake, alert , oriented and patient cooperative  Airway & Oxygen Therapy: Patient Spontanous Breathing and Patient connected to nasal cannula oxygen  Post-op Assessment: Report given to PACU RN, Post -op Vital signs reviewed and stable and Patient moving all extremities  Post vital signs: Reviewed and stable  Complications: No apparent anesthesia complications

## 2011-06-08 NOTE — Anesthesia Procedure Notes (Addendum)
Procedure Name: LMA Insertion and Intubation Date/Time: 06/08/2011 8:51 AM Performed by: Einar Crow Pre-anesthesia Checklist: Patient identified, Emergency Drugs available, Suction available and Patient being monitored Patient Re-evaluated:Patient Re-evaluated prior to inductionOxygen Delivery Method: Circle System Utilized Preoxygenation: Pre-oxygenation with 100% oxygen Intubation Type: IV induction Ventilation: Oral airway inserted - appropriate to patient size and Mask ventilation without difficulty LMA: LMA with gastric port inserted LMA Size: 4.0 Laryngoscope Size: Mac and 4 Grade View: Grade III Tube type: Oral Tube size: 7.5 mm Number of attempts: 2 Airway Equipment and Method: stylet Placement Confirmation: ETT inserted through vocal cords under direct vision,  positive ETCO2 and breath sounds checked- equal and bilateral Secured at: 22 cm Tube secured with: Tape Dental Injury: Teeth and Oropharynx as per pre-operative assessment  Difficulty Due To: Difficult Airway- due to anterior larynx Comments: Attempted to insert #4 LMA supreme, unable to seal after 2 attempts, Easy mask with OAW, DL x 2, anterior airway, Grade 3 view, Atraumatic oral intubation on 2nd attempt.         Narrative:    Anesthesia Regional Block:  Femoral nerve block  Pre-Anesthetic Checklist: ,, timeout performed, Correct Patient, Correct Site, Correct Laterality, Correct Procedure, Correct Position, site marked, Risks and benefits discussed,  Surgical consent,  Pre-op evaluation,  At surgeon's request and post-op pain management  Laterality: Right  Prep: chloraprep       Needles:  Injection technique: Single-shot  Needle Type: Echogenic Stimulator Needle     Needle Length:cm 9 cm Needle Gauge: 22 and 22 G    Additional Needles:  Procedures: nerve stimulator Femoral nerve block  Nerve Stimulator or Paresthesia:  Response: 0.3 mA, 3 cm  Additional Responses:   Narrative:  Start  time: 06/08/2011 7:25 AM End time: 06/08/2011 7:35 AM  Additional Notes: 30 cc 0.5% Marcaine 1:200 Epi injected without difficulty.

## 2011-06-09 LAB — BASIC METABOLIC PANEL
CO2: 25 mEq/L (ref 19–32)
Calcium: 7.8 mg/dL — ABNORMAL LOW (ref 8.4–10.5)
Chloride: 98 mEq/L (ref 96–112)
GFR calc Af Amer: 82 mL/min — ABNORMAL LOW (ref >90)
GFR calc non Af Amer: 71 mL/min — ABNORMAL LOW (ref >90)
GFR calc non Af Amer: 67 mL/min — ABNORMAL LOW (ref >90)
Glucose, Bld: 114 mg/dL — ABNORMAL HIGH (ref 70–99)
Potassium: 3.9 mEq/L (ref 3.5–5.1)
Potassium: 3.6 mEq/L (ref 3.5–5.1)
Sodium: 126 mEq/L — ABNORMAL LOW (ref 135–145)
Sodium: 128 mEq/L — ABNORMAL LOW (ref 135–145)

## 2011-06-09 LAB — HEMOGLOBIN AND HEMATOCRIT, BLOOD: HCT: 25.8 % — ABNORMAL LOW (ref 39.0–52.0)

## 2011-06-09 LAB — CBC
HCT: 29 % — ABNORMAL LOW (ref 39.0–52.0)
Hemoglobin: 9.5 g/dL — ABNORMAL LOW (ref 13.0–17.0)
MCHC: 32.8 g/dL (ref 30.0–36.0)
WBC: 6.3 10*3/uL (ref 4.0–10.5)

## 2011-06-09 LAB — PROTIME-INR
INR: 1.3 (ref 0.00–1.49)
Prothrombin Time: 16.4 seconds — ABNORMAL HIGH (ref 11.6–15.2)

## 2011-06-09 MED ORDER — SODIUM CHLORIDE 0.9 % IV SOLN: Freq: Once | INTRAVENOUS | Status: DC

## 2011-06-09 MED ORDER — WARFARIN SODIUM 7.5 MG PO TABS
7.5000 mg | ORAL_TABLET | Freq: Once | ORAL | Status: AC
  Administered 2011-06-09: 7.5 mg via ORAL
  Filled 2011-06-09: qty 1

## 2011-06-09 MED ORDER — POTASSIUM CHLORIDE IN NACL 20-0.9 MEQ/L-% IV SOLN
INTRAVENOUS | Status: DC
  Administered 2011-06-09: 17:00:00 via INTRAVENOUS
  Filled 2011-06-09 (×3): qty 1000

## 2011-06-09 NOTE — Progress Notes (Signed)
Physical Therapy Evaluation Patient Details Name: CHOZEN LATULIPPE MRN: [id number] DOB: [birth year] Today's Date: 06/09/2011  Problem List:  Patient Active Problem List  Diagnoses  . GERD  . IRRITABLE BOWEL SYNDROME  . ABDOMINAL PAIN-RUQ  . PERSONAL HX COLONIC POLYPS  . Chest pain  . Dyspnea  . Preop cardiovascular exam    Past Medical History:  Past Medical History  Diagnosis Date  . GERD (gastroesophageal reflux disease)     (Dr. Russella Dar)  . Colon polyp     (Dr. Russella Dar)  . Arthritis     Osteoarthritis right knee  . Benign prostatic hypertrophy     (Dr. Vernie Ammons)  . History of chronic prostatitis   . History of melanoma     Back (Dr. Karlyn Agee); early melanoma R arm 03/2011  . Diverticulosis   . Arthritis of hand, right     Right thumb (Dr. Teressa Senter)   Past Surgical History:  Past Surgical History  Procedure Date  . Total knee replaced 2000    Left knee  . Appendectomy   . Colonoscopy 2009  . Esophagogastroduodenoscopy 2009    mild inflammation at esophagogastric junction  . Excision of melanoma     back ( x3)  . Great toe surgery     bilateral  . Inguinal hernia repair     left, x 2    PT Assessment/Plan/Recommendation PT Assessment Clinical Impression Statement: Pt. is s/p right TKR with good progress first time up.  Will benefit from PT to maximize functional mobility/gait/ROM/strength for DC home. PT Recommendation/Assessment: Patient will need skilled PT in the acute care venue PT Problem List: Decreased strength;Decreased range of motion;Decreased activity tolerance;Decreased balance;Decreased mobility;Decreased knowledge of use of DME;Decreased knowledge of precautions Barriers to Discharge: None PT Therapy Diagnosis : Difficulty walking;Acute pain PT Plan PT Frequency: 7X/week PT Treatment/Interventions: DME instruction;Gait training;Stair training;Functional mobility training;Therapeutic exercise;Balance training;Patient/family education PT  Recommendation Follow Up Recommendations: Home health PT Equipment Recommended: None recommended by PT PT Goals  Acute Rehab PT Goals PT Goal Formulation: With patient Time For Goal Achievement: 7 days Pt will go Supine/Side to Sit: with modified independence PT Goal: Supine/Side to Sit - Progress: Progressing toward goal Pt will go Sit to Supine/Side: with modified independence PT Goal: Sit to Supine/Side - Progress: Not met Pt will Transfer Sit to Stand/Stand to Sit: with modified independence PT Transfer Goal: Sit to Stand/Stand to Sit - Progress: Progressing toward goal Pt will Ambulate: 51 - 150 feet;with modified independence;with rolling walker PT Goal: Ambulate - Progress: Progressing toward goal Pt will Go Up / Down Stairs: 1-2 stairs;with least restrictive assistive device PT Goal: Up/Down Stairs - Progress: Not met Pt will Perform Home Exercise Program: with min assist  PT Evaluation Precautions/Restrictions  Precautions Precautions: Knee Required Braces or Orthoses: Yes Knee Immobilizer: On except when in CPM (or with PT) Restrictions Weight Bearing Restrictions: Yes RLE Weight Bearing: Weight bearing as tolerated Prior Functioning  Home Living Lives With: Spouse Receives Help From: Family Type of Home: House Home Layout: One level Home Access: Stairs to enter Entrance Stairs-Rails: None Entrance Stairs-Number of Steps: 2 Bathroom Shower/Tub: Psychologist, counselling;Door [employer] Toilet: Standard Home Adaptive Equipment: Walker - rolling;Bedside commode/3-in-1;Built-in shower seat (CPM has been delivered) Prior Function Level of Independence: Independent with basic ADLs;Independent with gait;Independent with transfers Driving: Yes 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 RLE AROM (degrees) Right Knee Extension 0-130: -8  Right Knee Flexion 0-140: 58  RLE Strength Right Knee Extension: 2-/5 Right Ankle Dorsiflexion: 4/5 LLE Assessment LLE Assessment: Within Functional Limits Mobility (including Balance) Bed Mobility Bed Mobility: Yes Supine to Sit: 4: Min assist (especially for right LE) Supine to Sit Details (indicate cue type and reason): vc's for technique and safety Sit to Supine - Right: Not Tested (comment) Transfers Transfers: Yes Sit to Stand: 1: +2 Total assist;Patient percentage (comment) (pt. 70%) Sit to Stand Details (indicate cue type and reason): vc's for hand placement and safe technique Stand to Sit: 3: Mod assist Stand to Sit Details: to control descent Ambulation/Gait Ambulation/Gait: Yes Ambulation/Gait Assistance: 4: Min assist (second person for equipment) Ambulation/Gait Assistance Details (indicate cue type and reason): cues for gait sequence and step length, cues for walker distance Ambulation Distance (Feet): 12 Feet Assistive device: Rolling walker Gait Pattern: Step-to pattern;Decreased step length - right;Decreased hip/knee flexion - right Stairs: No  Posture/Postural Control Posture/Postural Control: Postural limitations Postural Limitations: vc/manual cues for upright posture Balance Balance Assessed: Yes Static Sitting Balance Static Sitting - Level of Assistance: 7: Independent Dynamic Sitting Balance Dynamic Sitting - Level of Assistance: 5: Stand by assistance Static Standing Balance Static Standing - Level of Assistance: 5: Stand by assistance Dynamic Standing Balance Dynamic Standing - Level of Assistance: 5: Stand by assistance Exercise  Total Joint Exercises Ankle Circles/Pumps: AROM;Right;5 reps Quad Sets: AROM;Right;5 reps End of Session PT - End of  Session Equipment Utilized During Treatment: Gait belt Activity Tolerance: Patient tolerated treatment well;Patient limited by pain;Patient limited by fatigue Patient left: in chair;with call bell in reach Nurse Communication: Mobility status for transfers;Mobility status for ambulation General Behavior During Session: [facility] for tasks performed Cognition: [facility] for tasks performed  Ferman Hamming 06/09/2011, 11:27 AM Acute Rehabilitation Services [phone] (pager)

## 2011-06-09 NOTE — Progress Notes (Signed)
Physical Therapy Treatment Patient Details Name: [name] MRN: [id number] DOB: 08-04-1940 Today's Date: 06/09/2011  PT Assessment/Plan  PT - Assessment/Plan Comments on Treatment Session: pt. progressing with mobility.  Not clear yet if he will progress adequately for DC tomorrow but this is what we are working toward. PT Plan: Discharge plan remains appropriate;Frequency remains appropriate PT Frequency: 7X/week Follow Up Recommendations: Home health PT Equipment Recommended: None recommended by PT PT Goals  Acute Rehab PT Goals PT Goal: Sit to Supine/Side - Progress: Progressing toward goal PT Transfer Goal: Sit to Stand/Stand to Sit - Progress: Progressing toward goal PT Goal: Ambulate - Progress: Progressing toward goal PT Goal: Up/Down Stairs - Progress: Not met PT Goal: Perform Home Exercise Program - Progress: Progressing toward goal  PT Treatment Precautions/Restrictions  Precautions Precautions: Knee Required Braces or Orthoses: Yes Knee Immobilizer: On at all times;On except when in CPM (or with PT) Restrictions Weight Bearing Restrictions: Yes RLE Weight Bearing: Weight bearing as tolerated Mobility (including Balance) Bed Mobility Bed Mobility: Yes Supine to Sit: 4: Min assist Supine to Sit Details (indicate cue type and reason): vc's for technique and safety Sit to Supine - Right: 4: Min assist (for right LE) Sit to Supine - Right Details (indicate cue type and reason): instruction for technique and pain reduction during mobility Transfers Transfers: Yes Sit to Stand: 3: Mod assist;From chair/3-in-1 (second person for equipment) Sit to Stand Details (indicate cue type and reason): hand placement and LE positioning reminders Stand to Sit: 4: Min assist Stand to Sit Details: to control descent Ambulation/Gait Ambulation/Gait: Yes Ambulation/Gait Assistance Details (indicate cue type and reason): reminders for correct sequence and step length Ambulation  Distance (Feet): 35 Feet Assistive device: Rolling walker Gait Pattern: Step-to pattern;Decreased hip/knee flexion - right Stairs: No    Exercise  Total Joint Exercises Ankle Circles/Pumps: AROM;10 reps Short Arc Quad: AAROM;Right;Supine (worked on Oceanographer) End of Session PT - End of Session Equipment Utilized During Treatment: Gait belt Activity Tolerance: Patient tolerated treatment well;Patient limited by fatigue;Patient limited by pain Patient left: in bed;with call bell in reach (pt. wanted to let pain med. get into his system before cpm) General Behavior During Session: [facility] for tasks performed Cognition: [facility] for tasks performed  Ferman Hamming 06/09/2011, 3:43 PM Acute Rehabilitation Services [phone] (pager)

## 2011-06-09 NOTE — Progress Notes (Signed)
Subjective: Doing well.  Pain controlled.  Hoping to go home tomorrow.  Objective: Vital signs in last 24 hours: Temp:  [97.7 F (36.5 C)-98.8 F (37.1 C)] 97.8 F (36.6 C) (11/29 0626) Pulse Rate:  [66-86] 86  (11/29 0626) Resp:  [9-20] 18  (11/29 0812) BP: (101-132)/(61-76) 104/67 mmHg (11/29 0626) SpO2:  [94 %-98 %] 97 % (11/29 0626)  Intake/Output from previous day: 11/28 0701 - 11/29 0700 In: 3588.5 [P.O.:780; I.V.:2808.5] Out: 4450 [Urine:3850; Drains:550; Blood:50] Intake/Output this shift:     Basename 06/09/11 0530  HGB 9.5*    Basename 06/09/11 0530  WBC 6.3  RBC 3.07*  HCT 29.0*  PLT 183    Basename 06/09/11 0530  NA 126*  K 3.9  CL 98  CO2 27  BUN 9  CREATININE 1.04  GLUCOSE 114*  CALCIUM 8.1*    Basename 06/09/11 0530  LABPT --  INR 1.30    Neurovascular intact Compartment soft Dressing c/d/i. Assessment/Plan: Hyponatremia- Change iv to NS w/ 20 kcl.  Anticipate d/c home tomorrow if does well with therapy.  D/c pca.  ABLA--  Will monitor hemoglobin.     Eulala Newcombe M 06/09/2011, 8:42 AM

## 2011-06-09 NOTE — Op Note (Signed)
NAMESABEN, DONIGAN NO.:  [id number]  MEDICAL RECORD NO.:  [id number]  LOCATION:  5041                         FACILITY:  [facility]  PHYSICIAN:  Loreta Ave, M.D. DATE OF BIRTH:  [birth year]  DATE OF PROCEDURE:  06/08/2011 DATE OF DISCHARGE:                              OPERATIVE REPORT   PREOPERATIVE DIAGNOSES:  Right knee end-stage degenerative arthritis, mild flexion contracture, varus alignment.  POSTOPERATIVE DIAGNOSES:  Right knee end-stage degenerative arthritis, mild flexion contracture, varus alignment.  PROCEDURE:  Modified minimally invasive right total knee replacement, Stryker triathlon prosthesis.  Soft tissue balancing.  Cemented peg posterior stabilized #7 femoral component.  Cemented #6 tibial component, 11-mm polyethylene insert.  Cemented resurfacing 38-mm patellar component.  SURGEON:  Loreta Ave, M.D.  ASSISTANT:  Genene Churn. Denton Meek., present throughout the entire case necessary for timely completion of procedure.  ANESTHESIA:  General.  BLOOD LOSS:  Minimal.  SPECIMENS:  None.  CULTURES:  None.  COMPLICATIONS:  None.  DRESSINGS:  Soft compressive knee immobilizer.  DRAIN:  Hemovac x1.  TOURNIQUET TIME:  1 hour and 10 minutes.  DESCRIPTION OF PROCEDURE:  The patient was brought to the operating room, placed on the operating table in supine position.  After adequate anesthesia had been obtained, tourniquet applied, prepped and draped in usual sterile fashion.  Exsanguinated with elevation of Esmarch. Tourniquet inflated to 350 mmHg.  Straight incision above the patella down to the tibial tubercle.  Hemostasis with cautery.  Medial arthrotomy, vastus splitting, preserving quad tendon.  Knee exposed. Grade 4 change throughout.  Periarticular spurs, loose bodies, remnants of menisci, cruciate ligaments removed.  Distal femur exposed. Intramedullary guide placed.  A 10-mm resection set at 5 degrees of valgus.  After  measuring the femur, it was sized cut and fitted for a posterior stabilized #7 femoral component peg.  Attention turned to the tibia.  Extramedullary guide.  A 3-degree posterior slope cut.  Size #6 component.  Debris cleared throughout the knee.  The knee was nicely balanced in flexion and extension.  Patella exposed, posterior 11 mm removed.  Size drilled and fitted for a 38-mm component.  Trials put in place throughout.  Tibia marked for rotation with trials.  All trials removed.  Copious irrigation with a pulse irrigating device.  Carolin [place] was prepared with rotation set with trials.  Cement prepared, placed on all components, firmly seated.  Polyethylene attached to tibia and knee reduced.  Patella clamped.  Once cement had hardened, the knee was reexamined.  Excellent balancing in flexion and extension.  Good stability in flexion and extension.  Good biomechanical axis, good tracking confirmed.  After Hemovac was placed laterally, arthrotomy closed with #1 Vicryl, skin and subcutaneous tissue with Vicryl and staples.  Sterile compressive dressing applied.  Tourniquet deflated, removed.  Knee immobilizer applied.  Anesthesia reversed.  Brought to recovery room.  Tolerated the surgery well.  No complications.     Loreta Ave, M.D.     DFM/MEDQ  D:  06/08/2011  T:  06/09/2011  Job:  [id number]

## 2011-06-09 NOTE — Progress Notes (Signed)
Occupational Therapy Evaluation Patient Details Name: [name] MRN: [id number] DOB: [birth year] Today's Date: 06/09/2011  Problem List:  Patient Active Problem List  Diagnoses  . GERD  . IRRITABLE BOWEL SYNDROME  . ABDOMINAL PAIN-RUQ  . PERSONAL HX COLONIC POLYPS  . Chest pain  . Dyspnea  . Preop cardiovascular exam    Past Medical History:  Past Medical History  Diagnosis Date  . GERD (gastroesophageal reflux disease)     (Dr. Russella Dar)  . Colon polyp     (Dr. Russella Dar)  . Arthritis     Osteoarthritis right knee  . Benign prostatic hypertrophy     (Dr. Vernie Ammons)  . History of chronic prostatitis   . History of melanoma     Back (Dr. Karlyn Agee); early melanoma R arm 03/2011  . Diverticulosis   . Arthritis of hand, right     Right thumb (Dr. Teressa Senter)   Past Surgical History:  Past Surgical History  Procedure Date  . Total knee replaced 2000    Left knee  . Appendectomy   . Colonoscopy 2009  . Esophagogastroduodenoscopy 2009    mild inflammation at esophagogastric junction  . Excision of melanoma     back ( x3)  . Great toe surgery     bilateral  . Inguinal hernia repair     left, x 2    OT Assessment/Plan/Recommendation OT Assessment Clinical Impression Statement: Pt will benefit from OT services in the acute care setting to increase I with ADLs and functional transfers in prep for d/c home with wife. OT Recommendation/Assessment: Patient will need skilled OT in the acute care venue OT Problem List: Decreased activity tolerance;Decreased knowledge of use of DME or AE;Pain OT Therapy Diagnosis : Acute pain OT Plan OT Frequency: 7X/week OT Treatment/Interventions: Self-care/ADL training;DME and/or AE instruction;Therapeutic activities;Patient/family education OT Recommendation Follow Up Recommendations: None Equipment Recommended: None recommended by OT Individuals Consulted Consulted and Agree with Results and Recommendations: Patient OT Goals Acute Rehab  OT Goals OT Goal Formulation: With patient Time For Goal Achievement: 7 days ADL Goals Pt Will Perform Grooming: with modified independence;Standing at sink ADL Goal: Grooming - Progress: Other (comment) Pt Will Perform Lower Body Bathing: with modified independence;Sit to stand from bed ADL Goal: Lower Body Bathing - Progress: Other (comment) Pt Will Perform Lower Body Dressing: with modified independence;Sit to stand from bed ADL Goal: Lower Body Dressing - Progress: Other (comment) Pt Will Transfer to Toilet: with modified independence;Ambulation;with DME;3-in-1 ADL Goal: Toilet Transfer - Progress: Other (comment) Miscellaneous OT Goals Miscellaneous OT Goal #1: Pt will perform bed mobility with mod I in prep for EOB ADLs. OT Goal: Miscellaneous Goal #1 - Progress: Other (comment)  OT Evaluation Precautions/Restrictions  Precautions Precautions: Knee Required Braces or Orthoses: Yes Knee Immobilizer: On except when in CPM Restrictions Weight Bearing Restrictions: Yes RLE Weight Bearing: Weight bearing as tolerated Prior Functioning Home Living Lives With: Spouse Receives Help From: Family Type of Home: House Home Layout: One level Home Access: Stairs to enter Entrance Stairs-Rails: None Entrance Stairs-Number of Steps: 2 Bathroom Shower/Tub: Psychologist, counselling;Door [employer] Toilet: Standard Home Adaptive Equipment: Walker - rolling;Bedside commode/3-in-1;Built-in shower seat Prior Function Level of Independence: Independent with basic ADLs;Independent with gait;Independent with transfers Driving: Yes Vocation: Part time employment ADL ADL Eating/Feeding: Simulated;Independent Where Assessed - Eating/Feeding: Chair Grooming: Performed;Teeth care;Shaving;Minimal assistance Grooming Details (indicate cue type and reason): Min guard for safety while standing at sink. Where Assessed - Grooming: Standing at sink Upper Body Bathing: Simulated;Set up  Upper Body Bathing  Details (indicate cue type and reason): Setup assist for gathering bathing materials. Where Assessed - Upper Body Bathing: Sitting, bed;Unsupported Lower Body Bathing: Simulated;Minimal assistance Lower Body Bathing Details (indicate cue type and reason): Min assist to reach R lower leg and foot Where Assessed - Lower Body Bathing: Sit to stand from bed Upper Body Dressing: Performed;Set up Upper Body Dressing Details (indicate cue type and reason): Setup assist to retrieve gown and to snap gown around IV line Where Assessed - Upper Body Dressing: Sitting, bed;Unsupported Lower Body Dressing: Performed;Moderate assistance Lower Body Dressing Details (indicate cue type and reason): Mod assist to thread RLE through undergarment and to pull undergarment up over hips. Where Assessed - Lower Body Dressing: Sit to stand from bed Toilet Transfer: Simulated;Minimal assistance Toilet Transfer Details (indicate cue type and reason): Simulated transfer ambulating from bed to chair. VC for sequencing and hand placement. Toilet Transfer Method: Ambulating Toileting - Clothing Manipulation: Simulated;Minimal assistance Toileting - Clothing Manipulation Details (indicate cue type and reason): Min assist to pull undergarment up over hips. Where Assessed - Toileting Clothing Manipulation: Standing Toileting - Hygiene: Simulated;Supervision/safety Where Assessed - Toileting Hygiene: Standing;Sit to stand from 3-in-1 or toilet Tub/Shower Transfer: Not assessed Tub/Shower Transfer Method: Not assessed Equipment Used: Rolling walker ADL Comments: Pt with decreased I with ADLs and functional transfers due to pain. Vision/Perception    Cognition Cognition Arousal/Alertness: Awake/alert Overall Cognitive Status: Appears within functional limits for tasks assessed Orientation Level: Oriented X4 Sensation/Coordination Sensation Light Touch: Appears Intact (both LE's) Extremity Assessment RUE Assessment RUE  Assessment: Within Functional Limits LUE Assessment LUE Assessment: Within Functional Limits Mobility  Bed Mobility Bed Mobility: Yes Supine to Sit: 4: Min assist Supine to Sit Details (indicate cue type and reason): vc's for technique and safety Sit to Supine - Right: Not Tested (comment) Transfers Transfers: Yes Sit to Stand: 1: +2 Total assist;Patient percentage (comment) Sit to Stand Details (indicate cue type and reason): vc's for hand placement and safe technique Stand to Sit: 3: Mod assist Stand to Sit Details: to control descent Exercises End of Session OT - End of Session Equipment Utilized During Treatment: Gait belt Activity Tolerance: Patient limited by pain Patient left: in chair;with call bell in reach General Behavior During Session: [facility] for tasks performed Cognition: [facility] for tasks performed   Cipriano Mile 06/09/2011, 12:46 PM  06/09/2011 Cipriano Mile OTR/L Pager [phone] Office [phone]

## 2011-06-09 NOTE — Progress Notes (Signed)
ANTICOAGULATION CONSULT NOTE - Follow Up Consult  Pharmacy Consult for warfarin Indication: VTE prophylaxis  Allergies  Allergen Reactions  . Penicillins     REACTION: rash    Patient Measurements: Height: 5' 9.02" (175.3 cm) Weight: 205 lb 4 oz (93.1 kg) IBW/kg (Calculated) : 70.74  Adjusted Body Weight:   Vital Signs: Temp: 97.8 F (36.6 C) (11/29 0626) BP: 104/67 mmHg (11/29 0626) Pulse Rate: 86  (11/29 0626)  Labs:  Basename 06/09/11 0530  HGB 9.5*  HCT 29.0*  PLT 183  APTT --  LABPROT 16.4*  INR 1.30  HEPARINUNFRC --  CREATININE 1.04  CKTOTAL --  CKMB --  TROPONINI --   Estimated Creatinine Clearance: 74.5 ml/min (by C-G formula based on Cr of 1.04).   Medications:  Scheduled:    . docusate sodium  100 mg Oral BID  . enoxaparin  30 mg Subcutaneous Q12H  . pantoprazole  40 mg Oral Daily  . patient's guide to using coumadin book   Does not apply Once  . vancomycin  1,000 mg Intravenous Q24H  . warfarin  7.5 mg Oral ONCE-1800  . warfarin   Does not apply Once  . DISCONTD: acetaminophen  1,000 mg Intravenous Q6H  . DISCONTD: morphine   Intravenous Q4H    Assessment: 70 yo M on warfarin for VTE pxl t/p TKA.  CBC stable.  INR increasing slightly towards goal.  Goal of Therapy:  INR 2-3   Plan:  1.  Warfarin 7.5 mg po x 1 @1800  2.  Daily INR  Jill Side L. Illene Bolus, PharmD, BCPS Clinical Pharmacist Pager: [phone] 06/09/2011 1:54 PM

## 2011-06-10 ENCOUNTER — Encounter (HOSPITAL_COMMUNITY): Payer: Self-pay | Admitting: Orthopedic Surgery

## 2011-06-10 LAB — CBC
MCHC: 33.1 g/dL (ref 30.0–36.0)
RDW: 15.5 % (ref 11.5–15.5)
WBC: 6.5 10*3/uL (ref 4.0–10.5)

## 2011-06-10 LAB — BASIC METABOLIC PANEL
CO2: 24 mEq/L (ref 19–32)
Calcium: 7.9 mg/dL — ABNORMAL LOW (ref 8.4–10.5)
Creatinine, Ser: 0.91 mg/dL (ref 0.50–1.35)
GFR calc non Af Amer: 84 mL/min — ABNORMAL LOW (ref >90)
Glucose, Bld: 127 mg/dL — ABNORMAL HIGH (ref 70–99)

## 2011-06-10 LAB — PROTIME-INR
INR: 1.68 — ABNORMAL HIGH (ref 0.00–1.49)
Prothrombin Time: 20.1 seconds — ABNORMAL HIGH (ref 11.6–15.2)

## 2011-06-10 MED ORDER — OXYCODONE HCL 5 MG PO TABS
5.0000 mg | ORAL_TABLET | ORAL | Status: DC | PRN
  Administered 2011-06-10 (×2): 5 mg via ORAL
  Filled 2011-06-10 (×2): qty 1

## 2011-06-10 MED ORDER — WARFARIN SODIUM 7.5 MG PO TABS
7.5000 mg | ORAL_TABLET | Freq: Once | ORAL | Status: DC
  Filled 2011-06-10: qty 1

## 2011-06-10 NOTE — Progress Notes (Signed)
Subjective: Doing well.  C/o knee pain. Want to go home today.   Objective: Vital signs in last 24 hours: Temp:  [97.8 F (36.6 C)-99.2 F (37.3 C)] 97.9 F (36.6 C) (11/30 0454) Pulse Rate:  [75-89] 75  (11/30 0454) Resp:  [18-20] 18  (11/30 0454) BP: (119-124)/(72-74) 120/72 mmHg (11/30 0454) SpO2:  [94 %-98 %] 98 % (11/30 0454) Weight:  [93.1 kg (205 lb 4 oz)] 205 lb 4 oz (93.1 kg) (11/29 2015)  Intake/Output from previous day: 11/29 0701 - 11/30 0700 In: 3193.5 [P.O.:1920; I.V.:1273.5] Out: 2050 [Urine:2050] Intake/Output this shift:     Basename 06/10/11 0720 06/09/11 1500 06/09/11 0530  HGB 9.2* 8.9* 9.5*    Basename 06/10/11 0720 06/09/11 1500 06/09/11 0530  WBC 6.5 -- 6.3  RBC 2.93* -- 3.07*  HCT 27.8* 25.8* --  PLT 165 -- 183    Basename 06/09/11 1500 06/09/11 0530  NA 128* 126*  K 3.6 3.9  CL 100 98  CO2 25 27  BUN 9 9  CREATININE 1.09 1.04  GLUCOSE 114* 114*  CALCIUM 7.8* 8.1*    Basename 06/10/11 0720 06/09/11 0530  LABPT -- --  INR 1.68* 1.30    Neurovascular intact No cellulitis present Compartment soft Wound looks good. Staples intact. Assessment/Plan: Asymptomatic abla.  Possible d/c home today if does well with therapy.  Saline lock iv. Increase percocet to 10/325.  [name] M 06/10/2011, 8:32 AM

## 2011-06-10 NOTE — Progress Notes (Signed)
Physical Therapy Treatment Patient Details Name: [name] MRN: [id number] DOB: 09/17/1940 Today's Date: 06/10/2011  PT Assessment/Plan  PT - Assessment/Plan Comments on Treatment Session: pt. maknig great progress today.  Is OK for DC home today from PT standpoint PT Plan: Discharge plan remains appropriate;Frequency remains appropriate PT Frequency: 7X/week Follow Up Recommendations: Home health PT Equipment Recommended: None recommended by PT PT Goals  Acute Rehab PT Goals PT Goal: Supine/Side to Sit - Progress: Met PT Transfer Goal: Sit to Stand/Stand to Sit - Progress: Met PT Goal: Ambulate - Progress: Met PT Goal: Up/Down Stairs - Progress: Met PT Goal: Perform Home Exercise Program - Progress: Met  PT Treatment Precautions/Restrictions  Precautions Precautions: Knee Required Braces or Orthoses: Yes Knee Immobilizer: On at all times;On except when in CPM (or with PT) Restrictions Weight Bearing Restrictions: Yes RLE Weight Bearing: Weight bearing as tolerated Mobility (including Balance) Bed Mobility Supine to Sit: 6: Modified independent (Device/Increase time);HOB elevated (Comment degrees) (35) Transfers Transfers: Yes Sit to Stand: 6: Modified independent (Device/Increase time) Stand to Sit: 6: Modified independent (Device/Increase time) Stand to Sit Details: pt. correctly uses hand placement and LE placement strategies Ambulation/Gait Ambulation/Gait: Yes Ambulation/Gait Assistance: 6: Modified independent (Device/Increase time) Ambulation/Gait Assistance Details (indicate cue type and reason): pt. able to correctly sequence his gait today Ambulation Distance (Feet): 200 Feet Assistive device: Rolling walker Gait Pattern: Step-to pattern Stairs: Yes Stairs Assistance: 4: Min assist Stairs Assistance Details (indicate cue type and reason): min assist for step negotiation with cues for technique andmin assist for bracing of RW Stair Management Technique:  No rails;Backwards;With walker Number of Stairs: 3     Exercise  Total Joint Exercises Ankle Circles/Pumps: AROM;Right;Supine;10 reps Quad Sets:  (knee ext. -5) Short Arc QuadBarbaraann Boys;Right;10 reps Knee Flexion:  (knee flex 85 degrees) End of Session PT - End of Session Equipment Utilized During Treatment: Gait belt Activity Tolerance: Patient tolerated treatment well Patient left: in chair Nurse Communication: Mobility status for transfers;Mobility status for ambulation General Behavior During Session: [facility] for tasks performed Cognition: [facility] for tasks performed  Ferman Hamming 06/10/2011, 1:26 PM Acute Rehabilitation Services [phone] (pager)

## 2011-06-10 NOTE — Progress Notes (Signed)
Occupational Therapy Treatment Patient Details Name: [name] MRN: [id number] DOB: [birth year] Today's Date: 06/10/2011  OT Assessment/Plan OT Assessment/Plan Comments on Treatment Session: Pt progressing towards goals. OT Plan: Discharge plan remains appropriate Equipment Recommended: None recommended by PT OT Goals ADL Goals Pt Will Perform Grooming: with modified independence;Standing at sink ADL Goal: Grooming - Progress: Met Pt Will Perform Lower Body Bathing: with modified independence;Sit to stand from bed ADL Goal: Lower Body Bathing - Progress: Not addressed Pt Will Perform Lower Body Dressing: with modified independence;Sit to stand from bed ADL Goal: Lower Body Dressing - Progress: Not addressed Pt Will Transfer to Toilet: with modified independence;Ambulation;with DME;3-in-1 ADL Goal: Toilet Transfer - Progress: Progressing toward goals Miscellaneous OT Goals Miscellaneous OT Goal #1: Pt will perform bed mobility with mod I in prep for EOB ADLs. OT Goal: Miscellaneous Goal #1 - Progress: Not addressed  OT Treatment Precautions/Restrictions      ADL   Mobility  Bed Mobility Bed Mobility: No Transfers Transfers: Yes Sit to Stand: 6: Modified independent (Device/Increase time) Stand to Sit: 6: Modified independent (Device/Increase time) Exercises   End of Session OT - End of Session Activity Tolerance: Patient tolerated treatment well Patient left: in chair;with call bell in reach General Behavior During Session: [facility] for tasks performed Cognition: [facility] for tasks performed  Cipriano Mile  06/10/2011, 3:35 PM 06/10/2011 Cipriano Mile OTR/L Pager [phone] Office [phone]

## 2011-06-10 NOTE — Progress Notes (Signed)
ANTICOAGULATION CONSULT NOTE - Follow Up Consult  Pharmacy Consult for lovenox bridge with coumadin Indication: VTE prophylaxis  Allergies  Allergen Reactions  . Penicillins     REACTION: rash    Patient Measurements: Height: 5\' 9"  (175.3 cm) Weight: 205 lb 4 oz (93.1 kg) IBW/kg (Calculated) : 70.7   Vital Signs: Temp: 97.9 F (36.6 C) (11/30 0454) BP: 120/72 mmHg (11/30 0454) Pulse Rate: 75  (11/30 0454)  Labs:  Basename 06/10/11 0720 06/09/11 1500 06/09/11 0530  HGB 9.2* 8.9* --  HCT 27.8* 25.8* 29.0*  PLT 165 -- 183  APTT -- -- --  LABPROT 20.1* -- 16.4*  INR 1.68* -- 1.30  HEPARINUNFRC -- -- --  CREATININE -- 1.09 1.04  CKTOTAL -- -- --  CKMB -- -- --  TROPONINI -- -- --     Assessment: Possible discharge for this 70 yo M post R TKR that is doing well.  INR  1.68 and lovenox  30mg  q12 is bridging while the coumadin gets therapeutic.  INR is rising slowly. Patient's warfarin predicition score is 6.  Will continue the 7.5mg  dosage to get to therapeutic.   Goal of Therapy:  INR 2 - 3 for ppx.   Plan:  Continue lovenox  30mg  q12 until INR is therapeutic for 5 day overlap. Coumadin 7.5mg  at 1800. Check INR in am. Note:  If discharged, consider 7.5mg  warfarin dose and 3 days of enoxaparin.   Walden Field B 06/10/2011,11:50 AM

## 2011-06-20 NOTE — Discharge Summary (Signed)
NAMEMICA, RELEFORD NO.:  [id number]  MEDICAL RECORD NO.:  [id number]  LOCATION:  5041                         FACILITY:  [facility]  PHYSICIAN:  Loreta Ave, M.D. DATE OF BIRTH:  04-10-1941  DATE OF ADMISSION:  06/08/2011 DATE OF DISCHARGE:  06/10/2011                              DISCHARGE SUMMARY   FINAL DIAGNOSES: 1. Status post right total knee replacement for end-stage degenerative     joint disease. 2. Long-term use of anticoagulants.  HISTORY OF PRESENT ILLNESS:  A 70 year old white male with history of end-stage DJD of right knee and chronic pain presented to our office for preop evaluation for total knee replacement.  He had progressively worsening pain with failed response with conservative treatment. Significant decrease in his daily activities due to the ongoing complaint.  HOSPITAL COURSE:  On June 08, 2011, the patient was taken to the [facility] OR and a right total knee replacement procedure was performed.  SURGEON:  Loreta Ave, MD  ASSISTANT:  Genene Churn. Barry Dienes, PA-C  ANESTHESIA:  General with minimal blood loss.  No specimens.  No cultures.  TOURNIQUET TIME:  1 hour and 10 minutes.  One Hemovac drain placed.  No surgical or anesthesia complications, and the patient was transferred to recovery in stable condition.  After the recovery room, the patient was transferred to the Orthopedic Unit.  Pharmacy protocol Coumadin and Lovenox started for DVT prophylaxis.  On June 09, 2011, the patient was doing well with good pain control.  Hemoglobin 9.5.  Sodium 126, potassium 3.9, chloride 98, CO2 of 27, BUN 9, creatinine 1.04.  Changed IV to normal saline with 20 KCl.  PT/OT consults.  On June 10, 2011, the patient was doing very well.  States that he is ready to go home.  Did very well with PT.  INR 1.68, hemoglobin 9.2.  Right knee wound looks good and staples intact. No drainage or signs of infection.  Hemovac drain  removed.  Calf nontender.  The patient did very well and is ready for discharge home.  DISCHARGE MEDICATIONS: 1. Percocet 10/325 one to two tablets p.o. q.4-6 h. p.r.n. for pain. 2. Robaxin 500 mg 1 tablet p.o. q.6 h. p.r.n. for spasms. 3. Lovenox 30 mg 1 subcu injection q.12 h. and discontinue when     Coumadin is therapeutic with INR of 2-3. 4. Coumadin pharmacy protocol.  Maintain INR 2-3 x4 weeks postop for     DVT prophylaxis.  CONDITION:  Good and stable.  DISPOSITION:  Discharged home.  INSTRUCTIONS:  While at home, the patient will continue to work with PT and OT to improve ambulation and knee range of motion and strengthening. Weight bear as tolerated.  Discontinue Lovenox when Coumadin is therapeutic with of INR 2-3.  Okay to shower, but no tub soaking.  Daily dressing changes with 4 x 4 gauze and apply TED hose over this.  Follow up with Dr. Eulah Pont 2 weeks postop for recheck and staple removal.     Genene Churn. Denton Meek.   ______________________________ Loreta Ave, M.D.    JMO/MEDQ  D:  06/20/2011  T:  06/20/2011  Job:  [id number]

## 2011-07-12 DIAGNOSIS — C9000 Multiple myeloma not having achieved remission: Secondary | ICD-10-CM

## 2011-07-12 HISTORY — DX: Multiple myeloma not having achieved remission: C90.00

## 2011-08-08 ENCOUNTER — Encounter: Payer: Self-pay | Admitting: Gastroenterology

## 2011-08-08 ENCOUNTER — Ambulatory Visit (INDEPENDENT_AMBULATORY_CARE_PROVIDER_SITE_OTHER): Payer: Medicare Other | Admitting: Gastroenterology

## 2011-08-08 VITALS — BP 110/60 | HR 64 | Ht 71.0 in | Wt 204.2 lb

## 2011-08-08 DIAGNOSIS — Z86010 Personal history of colonic polyps: Secondary | ICD-10-CM

## 2011-08-08 DIAGNOSIS — K219 Gastro-esophageal reflux disease without esophagitis: Secondary | ICD-10-CM

## 2011-08-08 MED ORDER — PANTOPRAZOLE SODIUM 40 MG PO TBEC: 40.0000 mg | DELAYED_RELEASE_TABLET | Freq: Every day | ORAL | Status: DC

## 2011-08-08 NOTE — Patient Instructions (Signed)
Your prescription for pantoprazole 40 mg has been sent to your mail order pharmacy for a 90 day supply. cc: Joselyn Arrow, MD

## 2011-08-08 NOTE — Progress Notes (Signed)
History of Present Illness: This is a 71 year-old male with a history of GERD and erosive esophagitis. His reflux symptoms are very well controlled on antireflux measures and daily pantoprazole. He underwent a total right knee replacement by Dr. Eulah Pont 2 months ago and has made a very good recovery. Denies weight loss, abdominal pain, constipation, diarrhea, change in stool caliber, melena, hematochezia, nausea, vomiting, dysphagia, chest pain.  Current Medications, Allergies, Past Medical History, Past Surgical History, Family History and Social History were reviewed in [employer] record.  Physical Exam: General: Well developed , well nourished, no acute distress Head: Normocephalic and atraumatic Eyes:  sclerae anicteric, EOMI Ears: Normal auditory acuity Mouth: No deformity or lesions Lungs: Clear throughout to auscultation Heart: Regular rate and rhythm; no murmurs, rubs or bruits Abdomen: Soft, non tender and non distended. No masses, hepatosplenomegaly or hernias noted. Normal Bowel sounds Pulses:  Normal pulses noted Extremities: No clubbing, cyanosis, edema or deformities noted Neurological: Alert oriented x 4, grossly nonfocal Psychological:  Alert and cooperative. Normal mood and affect  Assessment and Recommendations:  1. GERD. Continue antireflux measures and pantoprazole 40 mg daily.  2. Personal history of adenomatous colon polyps initially diagnosed in 1996. Surveillance colonoscopy recommended May 2014.  3. IBS. Symptoms well-controlled on Align daily.

## 2011-12-06 ENCOUNTER — Encounter: Payer: Self-pay | Admitting: Family Medicine

## 2011-12-06 ENCOUNTER — Ambulatory Visit (INDEPENDENT_AMBULATORY_CARE_PROVIDER_SITE_OTHER): Payer: Medicare Other | Admitting: Family Medicine

## 2011-12-06 VITALS — BP 130/80 | HR 70 | Temp 98.1°F | Wt 203.0 lb

## 2011-12-06 DIAGNOSIS — J209 Acute bronchitis, unspecified: Secondary | ICD-10-CM

## 2011-12-06 MED ORDER — CLARITHROMYCIN 500 MG PO TABS: 500.0000 mg | ORAL_TABLET | Freq: Two times a day (BID) | ORAL | Status: AC

## 2011-12-06 NOTE — Patient Instructions (Signed)
Take all the antibiotic. You can take as many as 4 Advil 3 times per day for the aches and pains. NyQuil at night for the cough. If any problems, don't hesitate to call

## 2011-12-06 NOTE — Progress Notes (Signed)
  Subjective:    Patient ID: [name], male    DOB: 1940/10/14, 71 y.o.   MRN: [id number]  HPI He has a 4 day history of fever, chills or left-sided sore throat with ear pain as well as upper lumbar pain. He developed a dry cough 2 days ago. No shortness of breath, nausea, vomiting, frequency or dysuria. He does not smoke. He does have x-ray evidence of COPD. He had a similar episode to this last fall.   Review of Systems     Objective:   Physical Exam alert and in no distress. Tympanic membranes and canals are normal. Throat is clear. Tonsils are normal. Neck is supple without adenopathy or thyromegaly. Cardiac exam shows a regular sinus rhythm without murmurs or gallops. Lungs are clear to auscultation.        Assessment & Plan:   1. Bronchitis, acute  clarithromycin (BIAXIN) 500 MG tablet  Take all the antibiotic. You can take as many as 4 Advil 3 times per day for the aches and pains. NyQuil at night for the cough. If any problems, don't hesitate to call. If continued difficulty, he is to return for further evaluation

## 2012-01-05 ENCOUNTER — Encounter: Payer: Self-pay | Admitting: Family Medicine

## 2012-01-05 ENCOUNTER — Ambulatory Visit (INDEPENDENT_AMBULATORY_CARE_PROVIDER_SITE_OTHER): Payer: Medicare Other | Admitting: Family Medicine

## 2012-01-05 ENCOUNTER — Other Ambulatory Visit: Payer: Self-pay | Admitting: Family Medicine

## 2012-01-05 VITALS — BP 130/74 | HR 76 | Ht 70.0 in | Wt 198.0 lb

## 2012-01-05 DIAGNOSIS — M62830 Muscle spasm of back: Secondary | ICD-10-CM

## 2012-01-05 DIAGNOSIS — R5381 Other malaise: Secondary | ICD-10-CM

## 2012-01-05 DIAGNOSIS — M5380 Other specified dorsopathies, site unspecified: Secondary | ICD-10-CM

## 2012-01-05 DIAGNOSIS — R5383 Other fatigue: Secondary | ICD-10-CM

## 2012-01-05 DIAGNOSIS — Z23 Encounter for immunization: Secondary | ICD-10-CM

## 2012-01-05 DIAGNOSIS — Z125 Encounter for screening for malignant neoplasm of prostate: Secondary | ICD-10-CM

## 2012-01-05 DIAGNOSIS — Z0000 Encounter for general adult medical examination without abnormal findings: Secondary | ICD-10-CM

## 2012-01-05 DIAGNOSIS — E785 Hyperlipidemia, unspecified: Secondary | ICD-10-CM

## 2012-01-05 LAB — CBC WITH DIFFERENTIAL/PLATELET
Eosinophils Absolute: 0.1 10*3/uL (ref 0.0–0.7)
Eosinophils Relative: 2 % (ref 0–5)
Lymphs Abs: 1.7 10*3/uL (ref 0.7–4.0)
MCH: 31.9 pg (ref 26.0–34.0)
MCHC: 35.1 g/dL (ref 30.0–36.0)
MCV: 90.9 fL (ref 78.0–100.0)
Platelets: 228 10*3/uL (ref 150–400)
RBC: 3.39 MIL/uL — ABNORMAL LOW (ref 4.22–5.81)

## 2012-01-05 LAB — POCT URINALYSIS DIPSTICK
Blood, UA: NEGATIVE
Ketones, UA: NEGATIVE
Protein, UA: NEGATIVE
Spec Grav, UA: 1.01
Urobilinogen, UA: NEGATIVE
pH, UA: 6

## 2012-01-05 NOTE — Patient Instructions (Addendum)
HEALTH MAINTENANCE RECOMMENDATIONS:  It is recommended that you get at least 30 minutes of aerobic exercise at least 5 days/week (for weight loss, you may need as much as 60-90 minutes). This can be any activity that gets your heart rate up. This can be divided in 10-15 minute intervals if needed, but try and build up your endurance at least once a week.  Weight bearing exercise is also recommended twice weekly.  Eat a healthy diet with lots of vegetables, fruits and fiber.  "Colorful" foods have a lot of vitamins (ie green vegetables, tomatoes, red peppers, etc).  Limit sweet tea, regular sodas and alcoholic beverages, all of which has a lot of calories and sugar.  Up to 2 alcoholic drinks daily may be beneficial for men (unless trying to lose weight, watch sugars).  Drink a lot of water.  Sunscreen of at least SPF 30 should be used on all sun-exposed parts of the skin when outside between the hours of 10 am and 4 pm (not just when at beach or pool, but even with exercise, golf, tennis, and yard work!)  Use a sunscreen that says "broad spectrum" so it covers both UVA and UVB rays, and make sure to reapply every 1-2 hours.  Remember to change the batteries in your smoke detectors when changing your clock times in the spring and fall.  Use your seat belt every time you are in a car, and please drive safely and not be distracted with cell phones and texting while driving.  Try and cut back on fluids in the evening to go to the bathroom less frequently at night.  Consider following up with urologist if ongoing frequent urination at night, to discuss possible medications to treat the BPH.  Muscle spasm in back--use the methocarbamol (muscle relaxant), heat and massage.  Call for referral to PT if not improving.

## 2012-01-05 NOTE — Progress Notes (Signed)
Chief Complaint  Patient presents with  . Annual Exam    fasting annual CPE. Has pain in his mid back on the right side only x 1 week. Hurts when he breathes in.   Pain is at R side, medial to scapula x 1-2 weeks.  Has some pain with breathing, and certain movements, rolling over in bed.  Motrin doesn't help (taking 600mg  TID for a week) neither did hydrocodone.  Pain can be 5-7/10. Denies radiation of pain.  Complaining of fatigue, especially in mid-afternoon and evenings.  Has h/o BPH, and has been getting up 4 times/night to void.  Saw Dr. Eulah Pont for back flare up (low back pain), and x-rays showed arthritis.  Given steroid shot in buttocks and muscle relaxants and has improved.  Still has some shortness of breath walking uphills. Has seen pulmonary and cardiology and tests were normal.  Last CXR 04/2011 was normal.  Health maintenance: Immunization History  Administered Date(s) Administered  . Influenza Split 04/05/2011  . Pneumococcal Polysaccharide 12/09/2005, 09/02/2010  . Td 06/11/2003  . Zoster 09/02/2010   Last colonoscopy: approx 3 years ago (Dr. Russella Dar) Last PSA: yearly, 2012 Dentist: twice yearly Ophtho: yearly Exercise: walking 1 hour daily  Past Medical History  Diagnosis Date  . GERD (gastroesophageal reflux disease)   . Adenomatous polyp of colon 1996  . Arthritis     Osteoarthritis right knee, spine, wrist  . Benign prostatic hypertrophy     (Dr. Vernie Ammons)  . History of chronic prostatitis   . History of melanoma     Back (Dr. Karlyn Agee); early melanoma R arm 03/2011  . Diverticulosis   . Arthritis of hand, right     Right thumb (Dr. Teressa Senter)  . Degenerative disc disease   . Hemorrhoids   . Erosive esophagitis     Past Surgical History  Procedure Date  . Total knee replaced 2000    Left knee  . Appendectomy   . Colonoscopy 2009  . Esophagogastroduodenoscopy 2009    mild inflammation at esophagogastric junction  . Excision of melanoma     back ( x3)   . Great toe surgery     bilateral  . Inguinal hernia repair     left, x 2  . Total knee arthroplasty 06/08/2011    Procedure: TOTAL KNEE ARTHROPLASTY;  Surgeon: Loreta Ave, MD;  Location: [facility];  Service: Orthopedics;  Laterality: Right;  osteonics    History   Social History  . Marital Status: Married    Spouse Name: N/A    Number of Children: 2  . Years of Education: N/A   Occupational History  . mortgage banking retired    Social History Main Topics  . Smoking status: Former Smoker -- 3.0 packs/day for 10 years    Quit date: 07/11/1976  . Smokeless tobacco: Never Used   Comment: 3 PPD x 6 years  . Alcohol Use: 1.5 oz/week    3 drink(s) per week     2 glasses of wine daily  . Drug Use: No  . Sexually Active: Yes -- Male partner(s)   Other Topics Concern  . Not on file   Social History Narrative  . No narrative on file    Family History  Problem Relation Age of Onset  . Dementia Mother   . Stroke Father   . Hypertension Father   . Diabetes Father   . Prostate cancer Maternal Grandfather   . Anesthesia problems Neg Hx    Current  Outpatient Prescriptions on File Prior to Visit  Medication Sig Dispense Refill  . ascorbic acid (VITAMIN C) 500 MG tablet Take 500 mg by mouth daily.      Marland Kitchen aspirin 81 MG tablet Take 81 mg by mouth every other day.       Marland Kitchen HYDROcodone-acetaminophen (NORCO) 7.5-325 MG per tablet Take 1 tablet by mouth as needed.      . Multiple Vitamin (MULTIVITAMIN) capsule Take 1 capsule by mouth daily.      . pantoprazole (PROTONIX) 40 MG tablet Take 1 tablet (40 mg total) by mouth daily.  90 tablet  3  . Probiotic Product (ALIGN) 4 MG CAPS Take 1 capsule by mouth daily.       Allergies  Allergen Reactions  . Penicillins     REACTION: rash   ROS:  The patient denies anorexia, fever, weight changes, headaches,  vision loss, decreased hearing, ear pain, hoarseness, palpitations, dizziness, syncope, cough, swelling, nausea, vomiting,  diarrhea, constipation, abdominal pain, melena, hematochezia, indigestion/heartburn, hematuria, incontinence, erectile dysfunction, weakened urine stream, dysuria, genital lesions, numbness, tingling, weakness, tremor, suspicious skin lesions, depression, anxiety, abnormal bleeding/bruising, or enlarged lymph nodes +Nocturia--4-5 times.  Sometimes has spasm of hand at night.  PHYSICAL EXAM: BP 130/74  Pulse 76  Ht 5\' 10"  (1.778 m)  Wt 198 lb (89.812 kg)  BMI 28.41 kg/m2  General Appearance:    Alert, cooperative, no distress, appears stated age  Head:    Normocephalic, without obvious abnormality, atraumatic  Eyes:    PERRL, conjunctiva/corneas clear, EOM's intact, fundi    benign  Ears:    Normal TM's and external ear canals  Nose:   Nares normal, mucosa normal, no drainage or sinus   tenderness  Throat:   Lips, mucosa, and tongue normal; teeth and gums normal  Neck:   Supple, no lymphadenopathy;  thyroid:  no   enlargement/tenderness/nodules; no carotid   bruit or JVD  Back:    Spine nontender, no curvature, ROM normal, no CVA     Tenderness.  Has palpable muscle spasm at R rhomboid muscles  Lungs:     Clear to auscultation bilaterally without wheezes, rales or     ronchi; respirations unlabored  Chest Wall:    No tenderness.  Has deformity of inferior ribs on L (h/o traumatic injury)   Heart:    Regular rate and rhythm, S1 and S2 normal, no murmur, rub   or gallop  Breast Exam:    No chest wall tenderness, masses or gynecomastia  Abdomen:     Soft, non-tender, nondistended, normoactive bowel sounds,    no masses, no hepatosplenomegaly  Genitalia:    Normal male external genitalia without lesions.  Testicles without masses, +large hydrocele (nontender, transilluminates) on right.  No inguinal hernias.  Rectal:    Normal sphincter tone, no masses or tenderness; guaiac negative stool.  Prostate smooth, no nodules, moderately enlarged.  Extremities:   No clubbing, cyanosis or edema    Pulses:   2+ and symmetric all extremities  Skin:   Skin color, texture, turgor normal, no rashes or lesions  Lymph nodes:   Cervical, supraclavicular, and axillary nodes normal  Neurologic:   CNII-XII intact, normal strength, sensation and gait; reflexes 2+ and symmetric throughout          Psych:   Normal mood, affect, hygiene and grooming.     ASSESSMENT/PLAN: 1. Routine general medical examination at a health care facility  POCT Urinalysis Dipstick, Visual acuity screening  2.  Other malaise and fatigue  Comprehensive metabolic panel, CBC with Differential, TSH, Testosterone  3. Special screening for malignant neoplasm of prostate  PSA, Medicare  4. Need for Tdap vaccination  Tdap vaccine greater than or equal to 7yo IM  5. Dyslipidemia  Lipid panel  6. Muscle spasm of back     Fatigue may come from interrupted sleep from nocturia.  Discussed limiting fluids (including alcohol) in the evenings.  Consider f/u with urologist, whom he has seen in past for BPH  R rhomboid spasm--shown stretches.  Recommended heat, massage.  Continue ibuprofen, and may restart methocarbamol that he got from ortho.  Possible sedation reviewed.  Discussed PSA screening (risks/benefits), recommended at least 30 minutes of aerobic activity at least 5 days/week; proper sunscreen use reviewed; healthy diet and alcohol recommendations (less than or equal to 2 drinks/day) reviewed; regular seatbelt use; changing batteries in smoke detectors. Self-testicular exams. Immunization recommendations discussed--TdaP given today.  Colonoscopy recommendations reviewed--UTD

## 2012-01-06 ENCOUNTER — Telehealth: Payer: Self-pay | Admitting: Family Medicine

## 2012-01-06 LAB — LIPID PANEL
LDL Cholesterol: 53 mg/dL (ref 0–99)
Total CHOL/HDL Ratio: 2.4 Ratio
VLDL: 9 mg/dL (ref 0–40)

## 2012-01-06 LAB — COMPREHENSIVE METABOLIC PANEL
ALT: 19 U/L (ref 0–53)
AST: 30 U/L (ref 0–37)
Albumin: 2.7 g/dL — ABNORMAL LOW (ref 3.5–5.2)
Alkaline Phosphatase: 59 U/L (ref 39–117)
BUN: 16 mg/dL (ref 6–23)
Potassium: 4.1 mEq/L (ref 3.5–5.3)
Sodium: 127 mEq/L — ABNORMAL LOW (ref 135–145)
Total Protein: 12.8 g/dL — ABNORMAL HIGH (ref 6.0–8.3)

## 2012-01-06 NOTE — Telephone Encounter (Signed)
Already noted/received lab results

## 2012-01-06 NOTE — Telephone Encounter (Signed)
Savannah from [facility] called and advised TOTAL PROTEIN HIGH 12.8

## 2012-01-13 ENCOUNTER — Telehealth: Payer: Self-pay | Admitting: Family Medicine

## 2012-01-13 LAB — PROTEIN ELECTROPHORESIS, SERUM
Albumin ELP: 28.7 % — ABNORMAL LOW (ref 55.8–66.1)
Alpha-1-Globulin: 2.9 % (ref 2.9–4.9)
Beta 2: 1.4 % — ABNORMAL LOW (ref 3.2–6.5)
Beta Globulin: 4.5 % — ABNORMAL LOW (ref 4.7–7.2)

## 2012-01-13 NOTE — Telephone Encounter (Signed)
Alvino Chapel said that the Protein Electrophoresis turn around time is 3 business days. The lab said that it may be done today but she can't say for sure. She wanted to let you know. CLS

## 2012-01-16 ENCOUNTER — Other Ambulatory Visit: Payer: Self-pay | Admitting: Family Medicine

## 2012-01-16 DIAGNOSIS — D649 Anemia, unspecified: Secondary | ICD-10-CM | POA: Insufficient documentation

## 2012-01-16 DIAGNOSIS — E871 Hypo-osmolality and hyponatremia: Secondary | ICD-10-CM | POA: Insufficient documentation

## 2012-01-16 NOTE — Progress Notes (Signed)
DR.[name] i JUST OPENED THIS THIS MORNING GOING TO GIVE TO Alvino Chapel

## 2012-01-18 ENCOUNTER — Ambulatory Visit (INDEPENDENT_AMBULATORY_CARE_PROVIDER_SITE_OTHER): Payer: Medicare Other | Admitting: Family Medicine

## 2012-01-18 ENCOUNTER — Encounter: Payer: Self-pay | Admitting: Family Medicine

## 2012-01-18 ENCOUNTER — Telehealth: Payer: Self-pay | Admitting: Internal Medicine

## 2012-01-18 VITALS — BP 148/80 | HR 72 | Ht 70.0 in | Wt 202.0 lb

## 2012-01-18 DIAGNOSIS — R778 Other specified abnormalities of plasma proteins: Secondary | ICD-10-CM | POA: Insufficient documentation

## 2012-01-18 DIAGNOSIS — D649 Anemia, unspecified: Secondary | ICD-10-CM

## 2012-01-18 DIAGNOSIS — R0989 Other specified symptoms and signs involving the circulatory and respiratory systems: Secondary | ICD-10-CM

## 2012-01-18 DIAGNOSIS — R0600 Dyspnea, unspecified: Secondary | ICD-10-CM

## 2012-01-18 DIAGNOSIS — R799 Abnormal finding of blood chemistry, unspecified: Secondary | ICD-10-CM

## 2012-01-18 DIAGNOSIS — E871 Hypo-osmolality and hyponatremia: Secondary | ICD-10-CM

## 2012-01-18 LAB — IRON: Iron: 58 ug/dL (ref 42–165)

## 2012-01-18 LAB — BASIC METABOLIC PANEL
CO2: 26 mEq/L (ref 19–32)
Calcium: 8.8 mg/dL (ref 8.4–10.5)
Creat: 0.98 mg/dL (ref 0.50–1.35)
Glucose, Bld: 85 mg/dL (ref 70–99)

## 2012-01-18 LAB — CBC WITH DIFFERENTIAL/PLATELET
Basophils Relative: 0 % (ref 0–1)
Eosinophils Absolute: 0.2 10*3/uL (ref 0.0–0.7)
Eosinophils Relative: 3 % (ref 0–5)
HCT: 26.9 % — ABNORMAL LOW (ref 39.0–52.0)
Hemoglobin: 9.3 g/dL — ABNORMAL LOW (ref 13.0–17.0)
MCH: 31.4 pg (ref 26.0–34.0)
MCHC: 34.6 g/dL (ref 30.0–36.0)
Monocytes Absolute: 0.4 10*3/uL (ref 0.1–1.0)
Monocytes Relative: 7 % (ref 3–12)

## 2012-01-18 LAB — FOLATE: Folate: >20 ng/mL

## 2012-01-18 NOTE — Telephone Encounter (Signed)
l/m to call for new pt appt  aom °

## 2012-01-18 NOTE — Telephone Encounter (Signed)
pt called back and new pt appt was made for 7/17   aom

## 2012-01-18 NOTE — Progress Notes (Signed)
Chief Complaint  Patient presents with  . Shortness of Breath    during excercise. Especially walking uphill-heart races and shortness of breath.  Also notes bone pain in the lower back.   HPI: Patient presents with worsening dyspnea, and to follow up on abnormal labs.  Labs were briefly reviewed 2 days ago via phone call--notable for hyponatremia, normocytic anemia, elevated total protein, but low albumin, and abnormal SPEP.  He returns today for UPEP, and serum IFE (wasn't able to add on to labs already drawn), as well as additional anemia w/u.  He had normal iron/folate/B12 back in September.  Denies any bleeding.  Just noting trouble walking up hills at [facility] unable to keep up with his wife.  He is a former smoker. Last CXR was 04/2011 (pre-op for knee surgery), showing COPD and mild scarring in the lung bases. He denies any fevers, cough.  He continues to have back pain--had x-rays recently by Dr. Farris Has (at [facility] told there was significant arthritis.  Constant throbbing in low back.  The only thing that helps with his back pain is oxycodone.  The hydrocodone doesn't help much.  Denies radicular symptoms or weakness.  Upper back pain has improved--slightly sore, but much better (had muscle spasm at his last visit).  Past Medical History  Diagnosis Date  . GERD (gastroesophageal reflux disease)   . Adenomatous polyp of colon 1996  . Arthritis     Osteoarthritis right knee, spine, wrist  . Benign prostatic hypertrophy     (Dr. Vernie Ammons)  . History of chronic prostatitis   . History of melanoma     Back (Dr. Karlyn Agee); early melanoma R arm 03/2011  . Diverticulosis   . Arthritis of hand, right     Right thumb (Dr. Teressa Senter)  . Degenerative disc disease   . Hemorrhoids   . Erosive esophagitis    Past Surgical History  Procedure Date  . Total knee replaced 2000    Left knee  . Appendectomy   . Colonoscopy 2009  . Esophagogastroduodenoscopy 2009    mild  inflammation at esophagogastric junction  . Excision of melanoma     back ( x3)  . Great toe surgery     bilateral  . Inguinal hernia repair     left, x 2  . Total knee arthroplasty 06/08/2011    Procedure: TOTAL KNEE ARTHROPLASTY;  Surgeon: Loreta Ave, MD;  Location: [facility] OR;  Service: Orthopedics;  Laterality: Right;  osteonics   Current Outpatient Prescriptions on File Prior to Visit  Medication Sig Dispense Refill  . ascorbic acid (VITAMIN C) 500 MG tablet Take 500 mg by mouth daily.      Marland Kitchen aspirin 81 MG tablet Take 81 mg by mouth every other day.       Marland Kitchen glucosamine-chondroitin 500-400 MG tablet Take 1 tablet by mouth 3 (three) times daily.      Marland Kitchen HYDROcodone-acetaminophen (NORCO) 7.5-325 MG per tablet Take 1 tablet by mouth as needed.      . methocarbamol (ROBAXIN) 500 MG tablet       . Multiple Vitamin (MULTIVITAMIN) capsule Take 1 capsule by mouth daily.      . pantoprazole (PROTONIX) 40 MG tablet Take 1 tablet (40 mg total) by mouth daily.  90 tablet  3  . Probiotic Product (ALIGN) 4 MG CAPS Take 1 capsule by mouth daily.       History   Social History  . Marital Status: Married  Spouse Name: N/A    Number of Children: 2  . Years of Education: N/A   Occupational History  . mortgage banking retired    Social History Main Topics  . Smoking status: Former Smoker -- 3.0 packs/day for 10 years    Quit date: 07/11/1976  . Smokeless tobacco: Never Used   Comment: 3 PPD x 6 years  . Alcohol Use: 1.5 oz/week    3 drink(s) per week     2 glasses of wine daily  . Drug Use: No  . Sexually Active: Yes -- Male partner(s)   Other Topics Concern  . Not on file   Social History Narrative  . No narrative on file   Allergies  Allergen Reactions  . Penicillins     REACTION: rash   ROS:  Denies fevers, URI symptoms, chest pain, palpitations, GI complaints, dysuria, skin rash or other concerns except per HPI  PHYSICAL EXAM: BP 148/80  Pulse 72  Ht 5\' 10"  (1.778 m)   Wt 202 lb (91.627 kg)  BMI 28.98 kg/m2 140/74 on repeat by MD, RA Well developed, pleasant male, well appearing, in no distress.  Appears younger than stated age Neck: no lymphadenopathy, thyromegaly Heart: regular rate and rhythm without murmur Lungs: Crackles at lung bases R>L. No wheezes, fair air movement Extremities: no edema Skin: no rash Psych: normal mood, affect, hygiene and grooming  Lab Results  Component Value Date   WBC 6.9 01/05/2012   HGB 10.8* 01/05/2012   HCT 30.8* 01/05/2012   MCV 90.9 01/05/2012   PLT 228 01/05/2012     Chemistry      Component Value Date/Time   NA 127* 01/05/2012 1054   K 4.1 01/05/2012 1054   CL 101 01/05/2012 1054   CO2 26 01/05/2012 1054   BUN 16 01/05/2012 1054   CREATININE 0.99 01/05/2012 1054   CREATININE 0.91 06/10/2011 1315      Component Value Date/Time   CALCIUM 8.6 01/05/2012 1054   ALKPHOS 59 01/05/2012 1054   AST 30 01/05/2012 1054   ALT 19 01/05/2012 1054   BILITOT 0.2* 01/05/2012 1054     Total Protein, serum electrophor 15.8 (H) 6.0 - 8.3 g/dL Albumin ELP 16.1 (L) 09.6 - 66.1 % Alpha-1-Globulin 2.9 2.9 - 4.9 % Alpha-2-Globulin 6.8 (L) 7.1 - 11.8 % Beta Globulin 4.5 (L) 4.7 - 7.2 % Beta 2 1.4 (L) 3.2 - 6.5 % Gamma Globulin 55.7 (H) 11.1 - 18.8 % M-Spike, % NOT DET g/dL   Lab Results  Component Value Date   PSA 1.74 01/05/2012   Lab Results  Component Value Date   TSH 1.957 01/05/2012   Lab Results  Component Value Date   TESTOSTERONE 303.16 01/05/2012   ASSESSMENT/PLAN:  1. Anemia  CBC with Differential, Vitamin B12, Iron, Ferritin, Folate  2. Dyspnea  DG Chest 2 View, CBC with Differential  3. Hyponatremia  Basic metabolic panel  4. Abnormal serum protein electrophoresis  Immunofixation electrophoresis, Protein Electrophoresis, Urine Rflx.   I suspect that he has either multiple myeloma or MGUS.  UPEP and serum IFE sent today, as well as referral to heme-onc.  This referral was put through Monday, after phone call to pt,  but we have been having difficulty with their office in scheduling the appointment--will continue to try.  I'm not sure how the hyponatremia fits into this picture.  Clearly the back pan can be consistent--may need imaging (plain films done through ortho; I suspect a bone scan may be needed  to further evaluate back pain, but will leave that up to heme-onc).  Will check CXR today given the findings on exam of crackles. He has known COPD changes and scarring, but exam is different today than at his CPE where lungs were clear. Will also repeat some anemia labs (normal 03/2011)  Patient understands the potential diagnoses and the plan.

## 2012-01-18 NOTE — Patient Instructions (Addendum)
Get chest x-ray at [facility]. We are referring you to hematologist-oncologist through [facility] and Suzette Battiest will call you with that appointment.

## 2012-01-19 ENCOUNTER — Other Ambulatory Visit: Payer: Self-pay | Admitting: *Deleted

## 2012-01-19 ENCOUNTER — Telehealth: Payer: Self-pay | Admitting: *Deleted

## 2012-01-19 ENCOUNTER — Ambulatory Visit
Admission: RE | Admit: 2012-01-19 | Discharge: 2012-01-19 | Disposition: A | Discharge status: Home / Self Care | Payer: Medicare Other | Source: Ambulatory Visit | Attending: Family Medicine | Admitting: Family Medicine

## 2012-01-19 DIAGNOSIS — R0602 Shortness of breath: Secondary | ICD-10-CM

## 2012-01-19 DIAGNOSIS — R0600 Dyspnea, unspecified: Secondary | ICD-10-CM

## 2012-01-19 NOTE — Telephone Encounter (Signed)
Just wanted to let you know that I spoke with [name] this morning and he told me that he went and had his CXR done this am. He is also scheduled to see Dr.[name] @ [facility] next Wed 01/25/12 @ 1pm.

## 2012-01-20 ENCOUNTER — Ambulatory Visit
Admission: RE | Admit: 2012-01-20 | Discharge: 2012-01-20 | Disposition: A | Discharge status: Home / Self Care | Payer: Medicare Other | Source: Ambulatory Visit | Attending: Family Medicine | Admitting: Family Medicine

## 2012-01-20 ENCOUNTER — Telehealth: Payer: Self-pay | Admitting: Internal Medicine

## 2012-01-20 DIAGNOSIS — R0602 Shortness of breath: Secondary | ICD-10-CM

## 2012-01-20 LAB — PROTEIN ELECTROPHORESIS, URINE REFLEX

## 2012-01-20 NOTE — Telephone Encounter (Signed)
Referred by Dr. Nilda Calamity Dx-Multi Myeloma/MGUS

## 2012-01-23 LAB — IMMUNOFIXATION ELECTROPHORESIS: IgM, Serum: 22 mg/dL — ABNORMAL LOW (ref 41–251)

## 2012-01-25 ENCOUNTER — Other Ambulatory Visit (HOSPITAL_BASED_OUTPATIENT_CLINIC_OR_DEPARTMENT_OTHER): Payer: Medicare Other | Admitting: Lab

## 2012-01-25 ENCOUNTER — Telehealth: Payer: Self-pay | Admitting: Internal Medicine

## 2012-01-25 ENCOUNTER — Ambulatory Visit (HOSPITAL_BASED_OUTPATIENT_CLINIC_OR_DEPARTMENT_OTHER): Payer: Medicare Other | Admitting: Internal Medicine

## 2012-01-25 ENCOUNTER — Encounter: Payer: Self-pay | Admitting: Internal Medicine

## 2012-01-25 ENCOUNTER — Ambulatory Visit: Payer: Medicare Other

## 2012-01-25 VITALS — BP 136/81 | HR 66 | Temp 97.0°F | Ht 70.0 in | Wt 204.5 lb

## 2012-01-25 DIAGNOSIS — M545 Low back pain: Secondary | ICD-10-CM

## 2012-01-25 DIAGNOSIS — D472 Monoclonal gammopathy: Secondary | ICD-10-CM

## 2012-01-25 DIAGNOSIS — D649 Anemia, unspecified: Secondary | ICD-10-CM

## 2012-01-25 LAB — CBC WITH DIFFERENTIAL/PLATELET
BASO%: 0.6 % (ref 0.0–2.0)
EOS%: 2.6 % (ref 0.0–7.0)
MCH: 32.8 pg (ref 27.2–33.4)
MCHC: 34.7 g/dL (ref 32.0–36.0)
RBC: 2.8 10*6/uL — ABNORMAL LOW (ref 4.20–5.82)
RDW: 16.1 % — ABNORMAL HIGH (ref 11.0–14.6)
lymph#: 1.7 10*3/uL (ref 0.9–3.3)

## 2012-01-25 MED ORDER — OXYCODONE-ACETAMINOPHEN 5-325 MG PO TABS: 1.0000 | ORAL_TABLET | ORAL | Status: DC | PRN

## 2012-01-25 NOTE — Progress Notes (Signed)
Patient came in today as a new patient and he has one insurance,the patient said that he is [place] as far as [employer] investment [employer].

## 2012-01-25 NOTE — Progress Notes (Signed)
[facility] [phone]   [phone]  CONSULT NOTE  REASON FOR CONSULTATION:  71 years old white male with questionable multiple myeloma.  HPI [name] is a 71 y.o. male with no significant past medical history. The patient mentions that over the last few months she started having increasing shortness breath with exertion as well as low back pain. He was seen by orthopedic surgeon and had x-ray of the back that showed degenerative disease. Chest x-ray on 01/20/2012 showed nodular opacity overlying the right lower lung confirmed to represent a nipple shadow and there was slightly decreased lung volumes with worsening bibasilar opacity possibly atelectasis. The patient was seen by his primary care physician Dr. Lynelle Doctor and was found to have anemia with hemoglobin ranging between 9-11 g/dLover the last several months. Iron study and ferritin were normal. Serum protein electrophoresis performed on 01/05/2012 showed total protein of 15.8 with an elevated gamma globulin of 55.7% but no detectable M spike. Urine protein electrophoreses on 01/18/2012 showed no detectable protein. Quantitative immunoglobulin performed on 12/19/2011 showed elevated total protein of 13.5 with IgG 10,100, IgA 28 and IgM 22. The patient was referred to me today for further evaluation and to rule out multiple myeloma. He is currently on Percocet and Mobic for back pain which is worse at nighttime. He also wear back braces.  The patient has no significant medical history. He denied having any history of hypertension, diabetes mellitus, coronary artery disease, stroke or hypercholesterolemia. He has no kidney disease. He has no family history of malignancy. He is married and has 2 daughters and 4 grandchildren. The patient is currently retired and used to have [employer]. He has remote history of smoking but quit 40 years ago. He drinks alcohol occasionally and no history of  drug abuse. @SFHPI @  Past Medical History  Diagnosis Date  . GERD (gastroesophageal reflux disease)   . Adenomatous polyp of colon 1996  . Arthritis     Osteoarthritis right knee, spine, wrist  . Benign prostatic hypertrophy     (Dr. Vernie Ammons)  . History of chronic prostatitis   . History of melanoma     Back (Dr. Karlyn Agee); early melanoma R arm 03/2011  . Diverticulosis   . Arthritis of hand, right     Right thumb (Dr. Teressa Senter)  . Degenerative disc disease   . Hemorrhoids   . Erosive esophagitis     Past Surgical History  Procedure Date  . Total knee replaced 2000    Left knee  . Appendectomy   . Colonoscopy 2009  . Esophagogastroduodenoscopy 2009    mild inflammation at esophagogastric junction  . Excision of melanoma     back ( x3)  . Great toe surgery     bilateral  . Inguinal hernia repair     left, x 2  . Total knee arthroplasty 06/08/2011    Procedure: TOTAL KNEE ARTHROPLASTY;  Surgeon: Loreta Ave, MD;  Location: [facility] OR;  Service: Orthopedics;  Laterality: Right;  osteonics    Family History  Problem Relation Age of Onset  . Dementia Mother   . Stroke Father   . Hypertension Father   . Diabetes Father   . Prostate cancer Maternal Grandfather   . Anesthesia problems Neg Hx     Social History History  Substance Use Topics  . Smoking status: Former Smoker -- 3.0 packs/day for 10 years    Quit date: 07/11/1976  . Smokeless tobacco: Never  Used   Comment: 3 PPD x 6 years  . Alcohol Use: 1.5 oz/week    3 drink(s) per week     2 glasses of wine daily    Allergies  Allergen Reactions  . Penicillins     REACTION: rash    Current Outpatient Prescriptions  Medication Sig Dispense Refill  . ascorbic acid (VITAMIN C) 500 MG tablet Take 500 mg by mouth daily.      Marland Kitchen aspirin 81 MG tablet Take 81 mg by mouth every other day.       Marland Kitchen glucosamine-chondroitin 500-400 MG tablet Take 1 tablet by mouth 3 (three) times daily.      Marland Kitchen HYDROcodone-acetaminophen  (NORCO) 7.5-325 MG per tablet Take 1 tablet by mouth as needed.      . methocarbamol (ROBAXIN) 500 MG tablet       . Multiple Vitamin (MULTIVITAMIN) capsule Take 1 capsule by mouth daily.      Marland Kitchen oxyCODONE-acetaminophen (PERCOCET/ROXICET) 5-325 MG per tablet Take 1 tablet by mouth every 4 (four) hours as needed for pain.  60 tablet  0  . pantoprazole (PROTONIX) 40 MG tablet Take 1 tablet (40 mg total) by mouth daily.  90 tablet  3  . Probiotic Product (ALIGN) 4 MG CAPS Take 1 capsule by mouth daily.      Marland Kitchen HYDROmorphone (DILAUDID) 2 MG tablet 2 mg.        Review of Systems  A comprehensive review of systems was negative except for: Constitutional: positive for fatigue and weight loss Respiratory: positive for dyspnea on exertion Musculoskeletal: positive for back pain  Physical Exam  GUY:QIHKV, healthy, no distress, well nourished and well developed SKIN: skin color, texture, turgor are normal HEAD: Normocephalic EYES: normal EARS: External ears normal OROPHARYNX:no exudate and no erythema  NECK: supple, no adenopathy LYMPH:  no palpable lymphadenopathy, no hepatosplenomegaly LUNGS: clear to auscultation  HEART: regular rate & rhythm, no murmurs and no gallops ABDOMEN:abdomen soft, non-tender, normal bowel sounds and no masses or organomegaly BACK: Back symmetric, no curvature, with tenderness at the lower lumbar area. EXTREMITIES:no joint deformities, effusion, or inflammation, no edema, no skin discoloration, no clubbing, no cyanosis  NEURO: alert & oriented x 3 with fluent speech, no focal motor/sensory deficits  PERFORMANCE STATUS: ECOG 1  LABORATORY DATA: Lab Results  Component Value Date   WBC 6.0 01/25/2012   HGB 9.2* 01/25/2012   HCT 26.5* 01/25/2012   MCV 94.6 01/25/2012   PLT 210 01/25/2012      Chemistry      Component Value Date/Time   NA 128* 01/18/2012 0934   K 3.9 01/18/2012 0934   CL 102 01/18/2012 0934   CO2 26 01/18/2012 0934   BUN 15 01/18/2012 0934    CREATININE 0.98 01/18/2012 0934   CREATININE 0.91 06/10/2011 1315      Component Value Date/Time   CALCIUM 8.8 01/18/2012 0934   ALKPHOS 59 01/05/2012 1054   AST 30 01/05/2012 1054   ALT 19 01/05/2012 1054   BILITOT 0.2* 01/05/2012 1054       RADIOGRAPHIC STUDIES: Dg Chest 1 View  01/20/2012  *RADIOLOGY REPORT*  Clinical Data: Shortness of breath crackles within the bilateral lung bases, history smoking, repeat examination with nipple markers  CHEST - 1 VIEW  Comparison: 01/19/2012; 04/19/2011; 01/04/2011  Findings:  Grossly unchanged borderline enlarged cardiac silhouette and mediastinal contours with mild tortuosity/ectasia of the thoracic aorta.  Previously described nodular opacity overlying the right lower lung is indeed confirmed to  represent a nipple shadow.  Minimal increase in bibasilar heterogeneous opacities.  No definite pleural effusion or pneumothorax.  Grossly unchanged bones.  IMPRESSION: 1.  Nodular opacity overlying the right lower lung confirmed to represent a nipple shadow. 2.  Slightly decreased lung volumes with worsening bibasilar opacities possibly atelectasis.  Original Report Authenticated By: Waynard Reeds, M.D.   Dg Chest 2 View  01/19/2012  *RADIOLOGY REPORT*  Clinical Data: Shortness of breath, audible crackles at the lung bases, smoking history  CHEST - 2 VIEW  Comparison: Chest x-ray of 04/19/2011  Findings: The lungs remain hyperaerated. There is a nodular opacity at the right lung base most consistent with nipple shadow.  If necessary clinically, repeat PA view with nipple markers is recommended.  No infiltrate or effusion is seen.  Mediastinal contours are stable.  The heart remains mildly enlarged.  There are degenerative changes throughout the thoracic spine.  IMPRESSION:  1.  Hyperaeration consistent with emphysema.  No active process. 2.  Probable nipple shadow at the right lung base.  Consider repeat view with nipple markers.  Original Report Authenticated By:  Juline Patch, M.D.    ASSESSMENT: This is a very pleasant 71 years old white male with paraproteinemia and anemia as well as low back pain highly suspicious for multiple myeloma.  PLAN: I have a lengthy discussion with the patient today about his current disease status and further investigation to confirm or rule out multiple myeloma. I ordered several studies today including repeat CBC, comprehensive metabolic panel, LDH and myeloma panel. I would consider the patient for a bone marrow biopsy and aspirate on 01/27/2012. I also ordered skeletal bone survey. I would see the patient back for followup visit next week for reevaluation and discussion of his biopsy and imaging results. For pain management I gave him prescription for Percocet 5/325 mg by mouth every 4 hours as needed for pain #60. He was advised to call immediately if he has any concerning symptoms in the interval.  All questions were answered. The patient knows to call the clinic with any problems, questions or concerns. We can certainly see the patient much sooner if necessary.  Thank you so much for allowing me to participate in the care of [name]. I will continue to follow up the patient with you and assist in his care.  I spent 30 minutes counseling the patient face to face. The total time spent in the appointment was 60 minutes.   Leeandre Nordling K. 01/25/2012, 3:01 PM

## 2012-01-25 NOTE — Telephone Encounter (Signed)
gve the pt his July 2013 appt calendar along with the bone survey appt

## 2012-01-25 NOTE — Progress Notes (Signed)
Bone marrow asp/bx scheduled with short stay and flow cytometry , Margaret for 01/27/12 at   0800. Pt instructed to arrive at 0645 , NPO after Midnight.

## 2012-01-26 ENCOUNTER — Ambulatory Visit (HOSPITAL_COMMUNITY)
Admission: RE | Admit: 2012-01-26 | Discharge: 2012-01-26 | Disposition: A | Discharge status: Home / Self Care | Payer: Medicare Other | Source: Ambulatory Visit | Attending: Internal Medicine | Admitting: Internal Medicine

## 2012-01-26 ENCOUNTER — Other Ambulatory Visit (HOSPITAL_COMMUNITY): Payer: Self-pay | Admitting: *Deleted

## 2012-01-26 DIAGNOSIS — M51379 Other intervertebral disc degeneration, lumbosacral region without mention of lumbar back pain or lower extremity pain: Secondary | ICD-10-CM | POA: Insufficient documentation

## 2012-01-26 DIAGNOSIS — M4120 Other idiopathic scoliosis, site unspecified: Secondary | ICD-10-CM | POA: Insufficient documentation

## 2012-01-26 DIAGNOSIS — D472 Monoclonal gammopathy: Secondary | ICD-10-CM

## 2012-01-26 DIAGNOSIS — C439 Malignant melanoma of skin, unspecified: Secondary | ICD-10-CM | POA: Insufficient documentation

## 2012-01-26 DIAGNOSIS — M5030 Other cervical disc degeneration, unspecified cervical region: Secondary | ICD-10-CM | POA: Insufficient documentation

## 2012-01-26 DIAGNOSIS — M5137 Other intervertebral disc degeneration, lumbosacral region: Secondary | ICD-10-CM | POA: Insufficient documentation

## 2012-01-26 LAB — IGG, IGA, IGM
IgA: 24 mg/dL — ABNORMAL LOW (ref 68–379)
IgG (Immunoglobin G), Serum: 9800 mg/dL — ABNORMAL HIGH (ref 650–1600)
IgM, Serum: 20 mg/dL — ABNORMAL LOW (ref 41–251)

## 2012-01-26 LAB — COMPREHENSIVE METABOLIC PANEL
ALT: 21 U/L (ref 0–53)
AST: 31 U/L (ref 0–37)
Albumin: 2.7 g/dL — ABNORMAL LOW (ref 3.5–5.2)
Alkaline Phosphatase: 56 U/L (ref 39–117)
Calcium: 8.7 mg/dL (ref 8.4–10.5)
Chloride: 101 mEq/L (ref 96–112)
Creatinine, Ser: 1.06 mg/dL (ref 0.50–1.35)
Potassium: 4 mEq/L (ref 3.5–5.3)

## 2012-01-26 LAB — KAPPA/LAMBDA LIGHT CHAINS: Lambda Free Lght Chn: 54 mg/dL — ABNORMAL HIGH (ref 0.57–2.63)

## 2012-01-27 ENCOUNTER — Other Ambulatory Visit: Payer: Self-pay | Admitting: *Deleted

## 2012-01-27 ENCOUNTER — Telehealth: Payer: Self-pay | Admitting: Internal Medicine

## 2012-01-27 ENCOUNTER — Encounter (HOSPITAL_COMMUNITY): Payer: Self-pay

## 2012-01-27 ENCOUNTER — Other Ambulatory Visit: Payer: Self-pay | Admitting: Internal Medicine

## 2012-01-27 ENCOUNTER — Ambulatory Visit (HOSPITAL_COMMUNITY)
Admission: RE | Admit: 2012-01-27 | Discharge: 2012-01-27 | Disposition: A | Discharge status: Home / Self Care | Payer: Medicare Other | Source: Ambulatory Visit | Attending: Internal Medicine | Admitting: Internal Medicine

## 2012-01-27 VITALS — BP 124/71 | HR 76 | Resp 16

## 2012-01-27 DIAGNOSIS — C9000 Multiple myeloma not having achieved remission: Secondary | ICD-10-CM

## 2012-01-27 DIAGNOSIS — D649 Anemia, unspecified: Secondary | ICD-10-CM | POA: Insufficient documentation

## 2012-01-27 DIAGNOSIS — D472 Monoclonal gammopathy: Secondary | ICD-10-CM | POA: Insufficient documentation

## 2012-01-27 HISTORY — DX: Dorsalgia, unspecified: M54.9

## 2012-01-27 LAB — DIFFERENTIAL
Eosinophils Absolute: 0.2 10*3/uL (ref 0.0–0.7)
Eosinophils Relative: 3 % (ref 0–5)
Lymphs Abs: 1.6 10*3/uL (ref 0.7–4.0)
Monocytes Absolute: 0.6 10*3/uL (ref 0.1–1.0)

## 2012-01-27 LAB — CBC
HCT: 27.1 % — ABNORMAL LOW (ref 39.0–52.0)
MCH: 31.6 pg (ref 26.0–34.0)
MCV: 94.1 fL (ref 78.0–100.0)
Platelets: 229 10*3/uL (ref 150–400)
RBC: 2.88 MIL/uL — ABNORMAL LOW (ref 4.22–5.81)
RDW: 16.1 % — ABNORMAL HIGH (ref 11.5–15.5)

## 2012-01-27 MED ORDER — MIDAZOLAM HCL 5 MG/5ML IJ SOLN
INTRAMUSCULAR | Status: AC | PRN
  Administered 2012-01-27 (×3): 1 mg via INTRAVENOUS
  Administered 2012-01-27: 2 mg via INTRAVENOUS
  Administered 2012-01-27 (×2): 1 mg via INTRAVENOUS

## 2012-01-27 MED ORDER — DIAZEPAM 5 MG/ML IJ SOLN
INTRAMUSCULAR | Status: AC
  Filled 2012-01-27: qty 2

## 2012-01-27 MED ORDER — SODIUM CHLORIDE 0.9 % IV SOLN
Freq: Once | INTRAVENOUS | Status: AC
  Administered 2012-01-27: 07:00:00 via INTRAVENOUS

## 2012-01-27 MED ORDER — MIDAZOLAM HCL 10 MG/2ML IJ SOLN
INTRAMUSCULAR | Status: AC
  Filled 2012-01-27: qty 2

## 2012-01-27 NOTE — ED Notes (Addendum)
Procedure complete. Pt tolerated well  This was not an ED note...done in [facility] at [facility]

## 2012-01-27 NOTE — Procedures (Signed)
Mallampati's class: 1 ASA class: 1 Bone Marrow Biopsy and Aspiration Procedure Note   Informed consent was obtained and potential risks including bleeding, infection and pain were reviewed with the patient.   Versed 7 mg given IV for sedation.  Posterior iliac crest(s) prepped with Betadine.   Lidocaine 2% local anesthesia infiltrated into the subcutaneous tissue.  Left bone marrow biopsy and left bone marrow aspirate was obtained.   The procedure was tolerated well and there were no complications.  Specimens sent for: routine histopathologic stains and sectioning, flow cytometry, cytogenetics and Myeloma Panel  Physician: Lajuana Matte.

## 2012-01-27 NOTE — Progress Notes (Signed)
Patient waiting for MRI to be scheduled that Dr Arbutus Ped ordered before DC

## 2012-01-27 NOTE — ED Notes (Signed)
BX site dsg c,d,i

## 2012-01-27 NOTE — Telephone Encounter (Signed)
called pt with both appts    aom

## 2012-01-31 ENCOUNTER — Inpatient Hospital Stay (HOSPITAL_COMMUNITY): Admission: RE | Admit: 2012-01-31 | Payer: Medicare Other | Source: Ambulatory Visit

## 2012-01-31 ENCOUNTER — Ambulatory Visit (HOSPITAL_COMMUNITY)
Admission: RE | Admit: 2012-01-31 | Discharge: 2012-01-31 | Disposition: A | Discharge status: Home / Self Care | Payer: Medicare Other | Source: Ambulatory Visit | Attending: Internal Medicine | Admitting: Internal Medicine

## 2012-01-31 DIAGNOSIS — M549 Dorsalgia, unspecified: Secondary | ICD-10-CM | POA: Insufficient documentation

## 2012-01-31 DIAGNOSIS — M51379 Other intervertebral disc degeneration, lumbosacral region without mention of lumbar back pain or lower extremity pain: Secondary | ICD-10-CM | POA: Insufficient documentation

## 2012-01-31 DIAGNOSIS — M5137 Other intervertebral disc degeneration, lumbosacral region: Secondary | ICD-10-CM | POA: Insufficient documentation

## 2012-01-31 DIAGNOSIS — D1809 Hemangioma of other sites: Secondary | ICD-10-CM | POA: Insufficient documentation

## 2012-02-01 ENCOUNTER — Ambulatory Visit: Payer: Medicare Other | Admitting: Internal Medicine

## 2012-02-02 ENCOUNTER — Ambulatory Visit (HOSPITAL_BASED_OUTPATIENT_CLINIC_OR_DEPARTMENT_OTHER): Payer: Medicare Other | Admitting: Internal Medicine

## 2012-02-02 ENCOUNTER — Other Ambulatory Visit: Payer: Self-pay | Admitting: Medical Oncology

## 2012-02-02 VITALS — BP 132/76 | HR 76 | Temp 97.0°F | Wt 204.9 lb

## 2012-02-02 DIAGNOSIS — C9000 Multiple myeloma not having achieved remission: Secondary | ICD-10-CM

## 2012-02-02 MED ORDER — DEXAMETHASONE 4 MG PO TABS: 4.0000 mg | ORAL_TABLET | ORAL | Status: AC

## 2012-02-02 MED ORDER — PROCHLORPERAZINE MALEATE 10 MG PO TABS: 10.0000 mg | ORAL_TABLET | Freq: Four times a day (QID) | ORAL | Status: DC | PRN

## 2012-02-02 MED ORDER — LENALIDOMIDE 25 MG PO CAPS: 25.0000 mg | ORAL_CAPSULE | Freq: Every day | ORAL | Status: DC

## 2012-02-02 MED ORDER — WARFARIN SODIUM 2 MG PO TABS: 2.0000 mg | ORAL_TABLET | Freq: Every day | ORAL | Status: DC

## 2012-02-02 NOTE — Telephone Encounter (Signed)
Gave pt appt for July, August and September 2013 lab, ML and chemo

## 2012-02-02 NOTE — Patient Instructions (Addendum)
Multiple Myeloma Multiple myeloma is the most common cancer of bone. It is caused by the uncontrolled multiplication of a type of white blood cell in the marrow. This white blood cell is called a plasma cell. This means the bone marrow is overworking producing plasma cells. Soon these overproduced cells begin to take up room in the marrow that is needed by other cells. This means that there are soon not enough red or white blood cells or platelets. Not enough red cells mean that the person is anemic. There are not enough red blood cells to carry oxygen around the body. There are not enough white blood cells to fight disease. This causes the person with multiple myeloma to not feel well. There is also bone pain through much of the body. SYMPTOMS  Anemia causes fatigue (tiredness) and weakness.   Back pain is common. This is from fractures (break in bones) caused by damage to the bones of the back.   Lack of white blood cells makes infection more likely.   Bleeding is a common problem from lack of the cells (platelets). Platelets help blood clots form. This may show up as bleeding from any place. Commonly this shows up as bleeding from the nose or gums.   Fractures (bone breaks) are more common anywhere. The back and ribs are the most commonly fractured areas.  DIAGNOSIS  This tumor is often suggested by blood tests. Often doing a bone marrow sample makes the diagnosis (learning what is wrong). This is a test performed by taking a small sample of bone with a small needle. This bone often comes from the sternum (breast bone). This sample is sent to a pathologist (a specialist in looking at tissue under a microscope). After looking at the sample under the microscope, the pathologist is able to make a diagnosis of the problem. X-rays may also show boney changes. TREATMENT   Occasionally, anti-cancer medications may be used with multiple myeloma. Your caregiver can discuss this with you.   Medications  can also be given to help with the bone pain.   There is no cure for multiple myeloma. Lifestyle changes can add years of quality living.  HOME CARE INSTRUCTIONS  Often there is no specific treatment for multiple myeloma. Most of the treatment consists of adjustments in dietary and living activities. Some of these changes include:  Your dietitian or caregiver helping you with your dietary questions.   Taking iron and vitamins as prescribed by your caregiver.   Eating a well balanced diet.   Staying active, but follow restrictions suggested by your caregiver. Avoiding heavy lifting (more than 10 pounds) and activities that cause increased pain.   Drinking plenty of water.   Using back braces and a cane may help with some of the boney pain.  SEEK IMMEDIATE MEDICAL CARE IF:  You develop severe, uncontrolled boney pain.   You or your family notices confusion, problems with decision-making or inability to stay awake.   You notice increased urination or constipation.   You notice problems holding your water or stool.   You have numbness or loss of control of your extremities (arms/hands or legs/feet).  Document Released: 03/22/2001 Document Revised: 06/16/2011 Document Reviewed: 06/22/2008 [facility] Patient Information 2012 [place], [place].  Followup instructions: #1 followup visit on 02/13/2012 #2 chemotherapy education class next week #3 chemotherapy to start 02/13/2012 with Velcade, Revlimid and dexamethasone. #4 start Coumadin 2 mg by mouth daily on the first day of the chemotherapy. #5 use Compazine for nausea  10 mg by mouth every 6 hours as needed after starting the chemotherapy. #6 call immediately if you have any fever or chills after your treatment

## 2012-02-02 NOTE — Progress Notes (Signed)
[facility] [phone]   Fax:([phone]  OFFICE PROGRESS NOTE  [name],Hector A, MD [address] [place] [address]  DIAGNOSIS: Stage IIA multiple myeloma diagnosed in July of 2013  PRIOR THERAPY: None.   CURRENT THERAPY: The patient expected to start on 02/13/2012, the first cycle of systemic chemotherapy with subcutaneous Velcade 1.3 mg/M2 on days 1, 4, 8 and 11 every 3 weeks in addition to Revlimid 25 mg by mouth daily for 14 days every 3 weeks and dexamethasone 40 mg on a weekly basis.  INTERVAL HISTORY: [name] 71 y.o. male returns to the clinic today for followup visit accompanied by his wife. The patient still complaining of low back pain but he is feeling much better after starting Percocet. He also takes 4 tablets of ibuprofen every 4 hours. He denied having any other significant complaints. No significant chest pain or shortness breath. He has no weight loss or night sweats. No bleeding issues. The patient underwent a bone marrow biopsy and aspirate on 01/27/2012. He also had skeletal bone survey and MRI of the lumbar spine performed recently. He is here for evaluation and discussion of his imaging and biopsy results as well as recommendation regarding treatment of his condition. Previous blood work showed the beta-2 microglobulin 4.12, free kappa light chain 2.33, free lambda light chain 54.0 with a kappa/lambda ratio of 0.04, IgG was 9800, IgA 24 and IgM 20.  MEDICAL HISTORY: Past Medical History  Diagnosis Date  . GERD (gastroesophageal reflux disease)   . Adenomatous polyp of colon 1996  . Arthritis     Osteoarthritis right knee, spine, wrist  . Benign prostatic hypertrophy     (Dr. Vernie Ammons)  . History of chronic prostatitis   . History of melanoma     Back (Dr. Karlyn Agee); early melanoma R arm 03/2011  . Diverticulosis   . Arthritis of hand, right     Right thumb (Dr. Teressa Senter)  . Degenerative disc disease   .  Hemorrhoids   . Erosive esophagitis   . Back ache     r/o Multiple Myeloma    ALLERGIES:  is allergic to penicillins.  MEDICATIONS:  Current Outpatient Prescriptions  Medication Sig Dispense Refill  . ascorbic acid (VITAMIN C) 500 MG tablet Take 500 mg by mouth daily.      Marland Kitchen aspirin 81 MG tablet Take 81 mg by mouth every other day.       . calcium carbonate (TUMS EX) 750 MG chewable tablet Chew 1 tablet by mouth 3 (three) times daily.      . cholecalciferol (VITAMIN D) 1000 UNITS tablet Take 1,000 Units by mouth daily.      . fish oil-omega-3 fatty acids 1000 MG capsule Take 500 mg by mouth daily.      Marland Kitchen glucosamine-chondroitin 500-400 MG tablet Take 1 tablet by mouth 3 (three) times daily.      Marland Kitchen HYDROcodone-acetaminophen (NORCO) 7.5-325 MG per tablet Take 1 tablet by mouth as needed.      . methocarbamol (ROBAXIN) 500 MG tablet       . Multiple Vitamin (MULTIVITAMIN) capsule Take 1 capsule by mouth daily.      Marland Kitchen oxyCODONE-acetaminophen (PERCOCET/ROXICET) 5-325 MG per tablet Take 1 tablet by mouth every 4 (four) hours as needed for pain.  60 tablet  0  . pantoprazole (PROTONIX) 40 MG tablet Take 1 tablet (40 mg total) by mouth daily.  90 tablet  3  . Probiotic  Product (ALIGN) 4 MG CAPS Take 1 capsule by mouth daily.      Marland Kitchen dexamethasone (DECADRON) 4 MG tablet Take 1 tablet (4 mg total) by mouth once a week. Ten tab po weekly start on 02/13/12 with chemo  120 tablet  0  . prochlorperazine (COMPAZINE) 10 MG tablet Take 1 tablet (10 mg total) by mouth every 6 (six) hours as needed.  60 tablet  0  . warfarin (COUMADIN) 2 MG tablet Take 1 tablet (2 mg total) by mouth daily.  90 tablet  0    SURGICAL HISTORY:  Past Surgical History  Procedure Date  . Total knee replaced 2000    Left knee  . Appendectomy   . Colonoscopy 2009  . Esophagogastroduodenoscopy 2009    mild inflammation at esophagogastric junction  . Excision of melanoma     back ( x3)  . Great toe surgery     bilateral  .  Inguinal hernia repair     left, x 2  . Total knee arthroplasty 06/08/2011    Procedure: TOTAL KNEE ARTHROPLASTY;  Surgeon: Loreta Ave, MD;  Location: [facility] OR;  Service: Orthopedics;  Laterality: Right;  osteonics    REVIEW OF SYSTEMS:  A comprehensive review of systems was negative except for: Musculoskeletal: positive for back pain   PHYSICAL EXAMINATION: General appearance: alert, cooperative and no distress Head: Normocephalic, without obvious abnormality, atraumatic Neck: no adenopathy Lymph nodes: Cervical, supraclavicular, and axillary nodes normal. Resp: clear to auscultation bilaterally Back: Tenderness at the lower lumbar vertebrae to palpation Cardio: regular rate and rhythm, S1, S2 normal, no murmur, click, rub or gallop GI: soft, non-tender; bowel sounds normal; no masses,  no organomegaly Extremities: extremities normal, atraumatic, no cyanosis or edema Neurologic: Alert and oriented X 3, normal strength and tone. Normal symmetric reflexes. Normal coordination and gait  ECOG PERFORMANCE STATUS: 0 - Asymptomatic  Blood pressure 132/76, pulse 76, temperature 97 F (36.1 C), temperature source Oral, weight 204 lb 14.4 oz (92.942 kg).  LABORATORY DATA: Lab Results  Component Value Date   WBC 6.2 01/27/2012   HGB 9.1* 01/27/2012   HCT 27.1* 01/27/2012   MCV 94.1 01/27/2012   PLT 229 01/27/2012      Chemistry      Component Value Date/Time   NA 127* 01/25/2012 1305   K 4.0 01/25/2012 1305   CL 101 01/25/2012 1305   CO2 24 01/25/2012 1305   BUN 14 01/25/2012 1305   CREATININE 1.06 01/25/2012 1305   CREATININE 0.98 01/18/2012 0934      Component Value Date/Time   CALCIUM 8.7 01/25/2012 1305   ALKPHOS 56 01/25/2012 1305   AST 31 01/25/2012 1305   ALT 21 01/25/2012 1305   BILITOT 0.2* 01/25/2012 1305      BONE MARROW REPORT FINAL DIAGNOSIS Diagnosis Bone Marrow, Aspirate,Biopsy, and Clot, left superior posterior iliac crest - HYPERCELLULAR BONE MARROW WITH PLASMA CELL  NEOPLASM - SEE COMMENT. PERIPHERAL BLOOD: - NORMOCYTIC-NORMOCHROMIC ANEMIA. Diagnosis Note The bone marrow shows increased number of plasma cells estimated at 44% of all cells in the sample associated with interstitial infiltrates and numerous variably sized clusters and sheets in the clot and biopsy sections. Immunohistochemical stains show lambda light chain restriction in plasma cells. The features are consistent with plasma cell neoplasm. (BNS:kh 01-31-12) Guerry Bruin MD Pathologist, Electronic Signature (Case signed 01/31/2012)  RADIOGRAPHIC STUDIES: Dg Chest 1 View  01/20/2012  *RADIOLOGY REPORT*  Clinical Data: Shortness of breath crackles within the bilateral lung bases,  history smoking, repeat examination with nipple markers  CHEST - 1 VIEW  Comparison: 01/19/2012; 04/19/2011; 01/04/2011  Findings:  Grossly unchanged borderline enlarged cardiac silhouette and mediastinal contours with mild tortuosity/ectasia of the thoracic aorta.  Previously described nodular opacity overlying the right lower lung is indeed confirmed to represent a nipple shadow.  Minimal increase in bibasilar heterogeneous opacities.  No definite pleural effusion or pneumothorax.  Grossly unchanged bones.  IMPRESSION: 1.  Nodular opacity overlying the right lower lung confirmed to represent a nipple shadow. 2.  Slightly decreased lung volumes with worsening bibasilar opacities possibly atelectasis.  Original Report Authenticated By: Waynard Reeds, M.D.   Dg Chest 2 View  01/19/2012  *RADIOLOGY REPORT*  Clinical Data: Shortness of breath, audible crackles at the lung bases, smoking history  CHEST - 2 VIEW  Comparison: Chest x-ray of 04/19/2011  Findings: The lungs remain hyperaerated. There is a nodular opacity at the right lung base most consistent with nipple shadow.  If necessary clinically, repeat PA view with nipple markers is recommended.  No infiltrate or effusion is seen.  Mediastinal contours are stable.  The  heart remains mildly enlarged.  There are degenerative changes throughout the thoracic spine.  IMPRESSION:  1.  Hyperaeration consistent with emphysema.  No active process. 2.  Probable nipple shadow at the right lung base.  Consider repeat view with nipple markers.  Original Report Authenticated By: Juline Patch, M.D.   Mr Lumbar Spine Wo Contrast  02/01/2012  *RADIOLOGY REPORT*  Clinical Data: Back pain.  Possible multiple myeloma.  MRI LUMBAR SPINE WITHOUT CONTRAST  Technique:  Multiplanar and multiecho pulse sequences of the lumbar spine were obtained without intravenous contrast.  Comparison: Radiography 01/26/2012  Findings: There is curvature convex to the right with the apex at L3.  The marrow pattern is homogeneous with the exception of some discogenic changes of the endplates at L4-5 and a benign appearing hemangioma within the right side of the S1 vertebral body.  Second smaller hemangioma is present within the right side of the L1 vertebral body.  No evidence of focal myeloma lesion.  There are minor, non compressive disc bulges at L2-3 and above.  No stenosis or neural compression.  At L3-4, there are endplate osteophytes and there is bulging of the disc.  There is mild stenosis of the lateral recesses and foramina without gross neural compression.  At L4-5, the disc is chronically degenerated with chronic endplate changes, endplate osteophytes and bulging of the disc.  There is narrowing of the lateral recesses and neural foramina.  There be some potential for neural compression at this level.  At L5-S1, there are endplate osteophytes there is mild bulging of the disc.  There is mild facet degeneration bilaterally.  There is mild foraminal narrowing bilaterally but no definite neural compression.  IMPRESSION: The marrow pattern is somewhat hypercellular in general but is homogeneous, without evidence of diagnosable myeloma lesion.  Ordinary degenerative changes in the lower lumbar spine without  definite neural compression.  See above for details at each level.  Original Report Authenticated By: Thomasenia Sales, M.D.   Dg Bone Survey Met  01/26/2012  *RADIOLOGY REPORT*  Clinical Data: Melanoma.  Evaluation for multiple myeloma.  METASTATIC BONE SURVEY  Comparison: None.  Findings:  The following bones were evaluated:  Lateral skull: There are scattered small rounded lucent areas within the calvarium, best seen posteriorly.  Cannot exclude myeloma lesions.  Cervical spine, AP and lateral views: Advanced degenerative disc  disease and facet disease.  Loss of normal cervical lordosis.  No acute bony abnormality or focal lytic lesion.  Thoracic spine, AP and lateral views: Diffuse degenerative changes throughout the thoracic spine.  No fracture, focal lytic lesion or acute bony abnormality.  Lumbar spine, AP and lateral views: Mild rightward scoliosis. Diffuse degenerative disc disease and facet disease, most pronounced in the lower lumbar spine.  No focal lytic lesion or fracture.  Bilateral shoulders, AP views: Degenerative changes in the shoulders bilaterally.  No focal lytic lesion or acute bony abnormality.  Bilateral humeri, AP views: No acute bony abnormality or focal lytic lesion.  Pelvis, AP view: Mild degenerative changes in the hips.  SI joints are symmetric and unremarkable.  No focal lytic lesion or acute bony abnormality.  Bilateral femora, AP views: No focal lytic lesion or acute bony abnormality.  IMPRESSION: The only suspicious findings are small lucent areas within the posterior calvarium, nonspecific.  Cannot exclude small myeloma lesions.  Original Report Authenticated By: Cyndie Chime, M.D.    ASSESSMENT: This is a very pleasant 71 years old white male recently diagnosed with stage II a multiple myeloma.   PLAN: I had a lengthy discussion today with the patient and his wife about his recent diagnosis with multiple myeloma as well as the imaging and lab results.  I discussed with  the patient the prognosis and treatment of his recent condition. I recommended for him induction chemotherapy with Velcade 1.3 mg/M2 on days 1, 4, 8 and 11 every 3 weeks in addition to Revlimid 25 mg by mouth daily for 14 days every 3 weeks as well as dexamethasone 40 mg by mouth on a weekly basis. I discussed with the patient adverse effect of this treatment including but not limited to myelosuppression, nausea and vomiting, renal dysfunction, increase risk for deep venous thrombosis as well as the teratogenic effect of Velcade. I will start the patient on low dose of Coumadin 2 mg by mouth daily for prophylaxis for deep venous thrombosis. I also discussed with the patient consideration of treatment with Zometa for his bone disease after I receive a dental clearance from his dentist. The patient expected to start the first cycle of his systemic chemotherapy on 02/13/2012.  He would be treated for a total of 4 cycles followed by repeat bone marrow biopsy and aspirate for evaluation of his disease. If he has a good response to this treatment, I may consider referring him for autologous peripheral blood stem cell transplant. The patient would have a chemotherapy education class next week.  He was also getting patient instruction and hand out about multiple myeloma. He would come back for followup visit with the start of the first cycle of his treatment. I advised the patient to continue on Percocet for his back pain and stopped taking ibuprofen at regular basis. The patient has several questions and I answered them completely to his satisfaction. He was advised to call me immediately she has any concerning symptoms in the interval.  All questions were answered. The patient knows to call the clinic with any problems, questions or concerns. We can certainly see the patient much sooner if necessary.  I spent 30 minutes counseling the patient face to face. The total time spent in the appointment was 50  minutes.

## 2012-02-02 NOTE — Telephone Encounter (Signed)
Pt registered for REMS at at [employer] Risk [employer] online site. Copy of pt agreement given to pt . I called revlimid to [facility] at [phone]. They will fax order request.

## 2012-02-03 ENCOUNTER — Telehealth: Payer: Self-pay | Admitting: Medical Oncology

## 2012-02-03 DIAGNOSIS — C9000 Multiple myeloma not having achieved remission: Secondary | ICD-10-CM

## 2012-02-03 NOTE — Telephone Encounter (Addendum)
I went on line to [employer] risk assessment program and got authorization for pts first revlimid Berkley Harvey [id number]. Called in rx to [facility]. I faxed rx for revlimid to [facility] per their request

## 2012-02-03 NOTE — Telephone Encounter (Signed)
Only received 2 tablets yesterday for decadron. I clarified rx with Arlys John at [facility] and called pt

## 2012-02-06 ENCOUNTER — Telehealth: Payer: Self-pay

## 2012-02-06 NOTE — Telephone Encounter (Signed)
Received note clearing Mr. [name] from a dental standpoint to receive chemotherapy.  Will have Dr. Arbutus Ped review and then sent a ccopy to medical records to be scanned into patient's EMR.

## 2012-02-07 ENCOUNTER — Encounter: Payer: Self-pay | Admitting: *Deleted

## 2012-02-07 ENCOUNTER — Encounter: Payer: Self-pay | Admitting: Internal Medicine

## 2012-02-07 ENCOUNTER — Other Ambulatory Visit: Payer: Medicare Other

## 2012-02-07 ENCOUNTER — Other Ambulatory Visit: Payer: Self-pay | Admitting: *Deleted

## 2012-02-07 DIAGNOSIS — R52 Pain, unspecified: Secondary | ICD-10-CM

## 2012-02-07 MED ORDER — OXYCODONE HCL 5 MG PO TABS: 5.0000 mg | ORAL_TABLET | ORAL | Status: AC

## 2012-02-07 NOTE — Progress Notes (Signed)
RECEIVED A FAX FROM [facility] CONCERNING A PRIOR AUTHORIZATION FOR REVLIMID. THIS REQUEST WAS GIVEN TO MANAGED CARE.

## 2012-02-07 NOTE — Progress Notes (Signed)
Pt wanted to see if it was okay to take oxycodone instead of percocet to decrease the amount of tylenol he is taking.  Okay to change to oxycodone per Dr Donnald Garre.  Pt has also been taking 2 tabs percocet q4hPRN, Dr Donnald Garre said it was okay to take 2 tabs if needed.  New rx written for oxycodone 5mg  q4hprn pain qty 60.  SLJ

## 2012-02-07 NOTE — Progress Notes (Signed)
Called [facility] @ [phone], revlimid 25 mg is approved from 02/07/12-08/09/14.

## 2012-02-13 ENCOUNTER — Ambulatory Visit (HOSPITAL_BASED_OUTPATIENT_CLINIC_OR_DEPARTMENT_OTHER): Payer: Medicare Other

## 2012-02-13 ENCOUNTER — Telehealth: Payer: Self-pay | Admitting: Internal Medicine

## 2012-02-13 ENCOUNTER — Encounter: Payer: Self-pay | Admitting: Physician Assistant

## 2012-02-13 ENCOUNTER — Other Ambulatory Visit (HOSPITAL_BASED_OUTPATIENT_CLINIC_OR_DEPARTMENT_OTHER): Payer: Medicare Other | Admitting: Lab

## 2012-02-13 ENCOUNTER — Ambulatory Visit (HOSPITAL_BASED_OUTPATIENT_CLINIC_OR_DEPARTMENT_OTHER): Payer: Medicare Other | Admitting: Physician Assistant

## 2012-02-13 VITALS — BP 149/74 | HR 83 | Temp 97.5°F | Resp 20 | Ht 70.0 in | Wt 203.7 lb

## 2012-02-13 DIAGNOSIS — C9000 Multiple myeloma not having achieved remission: Secondary | ICD-10-CM

## 2012-02-13 DIAGNOSIS — Z5112 Encounter for antineoplastic immunotherapy: Secondary | ICD-10-CM

## 2012-02-13 LAB — COMPREHENSIVE METABOLIC PANEL
ALT: 20 U/L (ref 0–53)
AST: 33 U/L (ref 0–37)
Alkaline Phosphatase: 70 U/L (ref 39–117)
BUN: 16 mg/dL (ref 6–23)
Creatinine, Ser: 0.86 mg/dL (ref 0.50–1.35)
Total Bilirubin: 0.1 mg/dL — ABNORMAL LOW (ref 0.3–1.2)

## 2012-02-13 LAB — CBC WITH DIFFERENTIAL/PLATELET
BASO%: 0.3 % (ref 0.0–2.0)
EOS%: 0.3 % (ref 0.0–7.0)
LYMPH%: 13.9 % — ABNORMAL LOW (ref 14.0–49.0)
MCH: 30.6 pg (ref 27.2–33.4)
MCHC: 32.9 g/dL (ref 32.0–36.0)
MCV: 93.1 fL (ref 79.3–98.0)
MONO#: 0.1 10*3/uL (ref 0.1–0.9)
MONO%: 1.5 % (ref 0.0–14.0)
NEUT%: 84 % — ABNORMAL HIGH (ref 39.0–75.0)
Platelets: 195 10*3/uL (ref 140–400)
RBC: 3.17 10*6/uL — ABNORMAL LOW (ref 4.20–5.82)
WBC: 5.8 10*3/uL (ref 4.0–10.3)
nRBC: 0 % (ref 0–0)

## 2012-02-13 MED ORDER — ZOLEDRONIC ACID 4 MG/100ML IV SOLN
4.0000 mg | Freq: Once | INTRAVENOUS | Status: AC
  Administered 2012-02-13: 4 mg via INTRAVENOUS
  Filled 2012-02-13: qty 100

## 2012-02-13 MED ORDER — SODIUM CHLORIDE 0.9 % IV SOLN
Freq: Once | INTRAVENOUS | Status: AC
  Administered 2012-02-13: 15:00:00 via INTRAVENOUS

## 2012-02-13 MED ORDER — ONDANSETRON HCL 8 MG PO TABS
8.0000 mg | ORAL_TABLET | Freq: Once | ORAL | Status: AC
  Administered 2012-02-13: 8 mg via ORAL

## 2012-02-13 MED ORDER — BORTEZOMIB CHEMO SQ INJECTION 3.5 MG (2.5MG/ML)
1.3000 mg/m2 | Freq: Once | INTRAMUSCULAR | Status: AC
Start: 2012-02-13 — End: 2012-02-13
  Administered 2012-02-13: 2.75 mg via SUBCUTANEOUS
  Filled 2012-02-13: qty 2.75

## 2012-02-13 NOTE — Progress Notes (Signed)
[facility] Telephone:([phone]) [phone]   Fax:([phone]) [phone]  OFFICE PROGRESS NOTE  [name],Hector A, MD [address] [place] [address]  DIAGNOSIS: Stage IIA multiple myeloma diagnosed in July of 2013  PRIOR THERAPY: None.   CURRENT THERAPY:  1.  systemic chemotherapy with subcutaneous Velcade 1.3 mg/M2 on days 1, 4, 8 and 11 every 3 weeks in addition to Revlimid 25 mg by mouth daily for 14 days every 3 weeks and dexamethasone 40 mg on a weekly basis. 2. Prophylactic dose Coumadin at 2 mg by mouth daily 3. Zometa 4 mg IV given a monthly basis for bone disease  INTERVAL HISTORY: [name] 71 y.o. male returns to the clinic today for followup visit to proceed with his first cycle of systemic chemotherapy with subcutaneous Velcade in addition to Revlimid and dexamethasone and prophylactic dose Coumadin. He started his Revlimid dexamethasone and Coumadin today. He has some questions about his medications including his pain medications. All of his  questions were answered and his medications clarified. He does state that the oxycodone without Tylenol made him feel "out of it". I did explain to him that this is the same amount of oxycodone that was in his Percocet. He did voice understanding. He will try taking a half a tablet of the oxycodone to see this both helped his pain but also makes him feel less "out of it". He does report some night sweats but denied any other specific complaints.  No significant chest pain or shortness breath. He has no weight loss.  No bleeding issues.   MEDICAL HISTORY: Past Medical History  Diagnosis Date  . GERD (gastroesophageal reflux disease)   . Adenomatous polyp of colon 1996  . Arthritis     Osteoarthritis right knee, spine, wrist  . Benign prostatic hypertrophy     (Dr. Vernie Ammons)  . History of chronic prostatitis   . History of melanoma     Back (Dr. Karlyn Agee); early melanoma R arm 03/2011  . Diverticulosis   .  Arthritis of hand, right     Right thumb (Dr. Teressa Senter)  . Degenerative disc disease   . Hemorrhoids   . Erosive esophagitis   . Back ache     r/o Multiple Myeloma    ALLERGIES:  is allergic to penicillins.  MEDICATIONS:  Current Outpatient Prescriptions  Medication Sig Dispense Refill  . ascorbic acid (VITAMIN C) 500 MG tablet Take 500 mg by mouth daily.      Marland Kitchen aspirin 81 MG tablet Take 81 mg by mouth every other day.       . calcium carbonate (TUMS EX) 750 MG chewable tablet Chew 1 tablet by mouth 3 (three) times daily.      . cholecalciferol (VITAMIN D) 1000 UNITS tablet Take 1,000 Units by mouth daily.      Marland Kitchen dexamethasone (DECADRON) 4 MG tablet Take 1 tablet (4 mg total) by mouth once a week. Ten tab po weekly start on 02/13/12 with chemo  120 tablet  0  . fish oil-omega-3 fatty acids 1000 MG capsule Take 500 mg by mouth daily.      Marland Kitchen glucosamine-chondroitin 500-400 MG tablet Take 1 tablet by mouth 3 (three) times daily.      Marland Kitchen HYDROcodone-acetaminophen (NORCO) 7.5-325 MG per tablet Take 1 tablet by mouth as needed.      Marland Kitchen lenalidomide (REVLIMID) 25 MG capsule Take 1 capsule (25 mg total) by mouth daily.  14 capsule  2  .  methocarbamol (ROBAXIN) 500 MG tablet       . Multiple Vitamin (MULTIVITAMIN) capsule Take 1 capsule by mouth daily.      Marland Kitchen oxyCODONE (OXY IR/ROXICODONE) 5 MG immediate release tablet Take 1 tablet (5 mg total) by mouth as directed. 1-2 tablets every 4 hours prn pain  60 tablet  0  . pantoprazole (PROTONIX) 40 MG tablet Take 1 tablet (40 mg total) by mouth daily.  90 tablet  3  . Probiotic Product (ALIGN) 4 MG CAPS Take 1 capsule by mouth daily.      . prochlorperazine (COMPAZINE) 10 MG tablet Take 1 tablet (10 mg total) by mouth every 6 (six) hours as needed.  60 tablet  0  . warfarin (COUMADIN) 2 MG tablet Take 1 tablet (2 mg total) by mouth daily.  90 tablet  0    SURGICAL HISTORY:  Past Surgical History  Procedure Date  . Total knee replaced 2000    Left  knee  . Appendectomy   . Colonoscopy 2009  . Esophagogastroduodenoscopy 2009    mild inflammation at esophagogastric junction  . Excision of melanoma     back ( x3)  . Great toe surgery     bilateral  . Inguinal hernia repair     left, x 2  . Total knee arthroplasty 06/08/2011    Procedure: TOTAL KNEE ARTHROPLASTY;  Surgeon: Loreta Ave, MD;  Location[facility] OR;  Service: Orthopedics;  Laterality: Right;  osteonics    REVIEW OF SYSTEMS:  A comprehensive review of systems was negative except for: Constitutional: positive for night sweats Musculoskeletal: positive for back pain   PHYSICAL EXAMINATION: General appearance: alert, cooperative and no distress Head: Normocephalic, without obvious abnormality, atraumatic Neck: no adenopathy Lymph nodes: Cervical, supraclavicular, and axillary nodes normal. Resp: clear to auscultation bilaterally Back: Tenderness at the lower lumbar vertebrae to palpation Cardio: regular rate and rhythm, S1, S2 normal, no murmur, click, rub or gallop GI: soft, non-tender; bowel sounds normal; no masses,  no organomegaly Extremities: extremities normal, atraumatic, no cyanosis or edema Neurologic: Alert and oriented X 3, normal strength and tone. Normal symmetric reflexes. Normal coordination and gait  ECOG PERFORMANCE STATUS: 0 - Asymptomatic  Blood pressure 149/74, pulse 83, temperature 97.5 F (36.4 C), temperature source Oral, resp. rate 20, height 5\' 10"  (1.778 m), weight 203 lb 11.2 oz (92.398 kg).  LABORATORY DATA: Lab Results  Component Value Date   WBC 5.8 02/13/2012   HGB 9.7* 02/13/2012   HCT 29.5* 02/13/2012   MCV 93.1 02/13/2012   PLT 195 02/13/2012      Chemistry      Component Value Date/Time   NA 127* 01/25/2012 1305   K 4.0 01/25/2012 1305   CL 101 01/25/2012 1305   CO2 24 01/25/2012 1305   BUN 14 01/25/2012 1305   CREATININE 1.06 01/25/2012 1305   CREATININE 0.98 01/18/2012 0934      Component Value Date/Time   CALCIUM 8.7 01/25/2012  1305   ALKPHOS 56 01/25/2012 1305   AST 31 01/25/2012 1305   ALT 21 01/25/2012 1305   BILITOT 0.2* 01/25/2012 1305      BONE MARROW REPORT FINAL DIAGNOSIS Diagnosis Bone Marrow, Aspirate,Biopsy, and Clot, left superior posterior iliac crest - HYPERCELLULAR BONE MARROW WITH PLASMA CELL NEOPLASM - SEE COMMENT. PERIPHERAL BLOOD: - NORMOCYTIC-NORMOCHROMIC ANEMIA. Diagnosis Note The bone marrow shows increased number of plasma cells estimated at 44% of all cells in the sample associated with interstitial infiltrates and numerous variably  sized clusters and sheets in the clot and biopsy sections. Immunohistochemical stains show lambda light chain restriction in plasma cells. The features are consistent with plasma cell neoplasm. (BNS:kh 01-31-12) Guerry Bruin MD Pathologist, Electronic Signature (Case signed 01/31/2012)  RADIOGRAPHIC STUDIES: Dg Chest 1 View  01/20/2012  *RADIOLOGY REPORT*  Clinical Data: Shortness of breath crackles within the bilateral lung bases, history smoking, repeat examination with nipple markers  CHEST - 1 VIEW  Comparison: 01/19/2012; 04/19/2011; 01/04/2011  Findings:  Grossly unchanged borderline enlarged cardiac silhouette and mediastinal contours with mild tortuosity/ectasia of the thoracic aorta.  Previously described nodular opacity overlying the right lower lung is indeed confirmed to represent a nipple shadow.  Minimal increase in bibasilar heterogeneous opacities.  No definite pleural effusion or pneumothorax.  Grossly unchanged bones.  IMPRESSION: 1.  Nodular opacity overlying the right lower lung confirmed to represent a nipple shadow. 2.  Slightly decreased lung volumes with worsening bibasilar opacities possibly atelectasis.  Original Report Authenticated By: Waynard Reeds, M.D.   Dg Chest 2 View  01/19/2012  *RADIOLOGY REPORT*  Clinical Data: Shortness of breath, audible crackles at the lung bases, smoking history  CHEST - 2 VIEW  Comparison: Chest x-ray of  04/19/2011  Findings: The lungs remain hyperaerated. There is a nodular opacity at the right lung base most consistent with nipple shadow.  If necessary clinically, repeat PA view with nipple markers is recommended.  No infiltrate or effusion is seen.  Mediastinal contours are stable.  The heart remains mildly enlarged.  There are degenerative changes throughout the thoracic spine.  IMPRESSION:  1.  Hyperaeration consistent with emphysema.  No active process. 2.  Probable nipple shadow at the right lung base.  Consider repeat view with nipple markers.  Original Report Authenticated By: Juline Patch, M.D.   Mr Lumbar Spine Wo Contrast  02/01/2012  *RADIOLOGY REPORT*  Clinical Data: Back pain.  Possible multiple myeloma.  MRI LUMBAR SPINE WITHOUT CONTRAST  Technique:  Multiplanar and multiecho pulse sequences of the lumbar spine were obtained without intravenous contrast.  Comparison: Radiography 01/26/2012  Findings: There is curvature convex to the right with the apex at L3.  The marrow pattern is homogeneous with the exception of some discogenic changes of the endplates at L4-5 and a benign appearing hemangioma within the right side of the S1 vertebral body.  Second smaller hemangioma is present within the right side of the L1 vertebral body.  No evidence of focal myeloma lesion.  There are minor, non compressive disc bulges at L2-3 and above.  No stenosis or neural compression.  At L3-4, there are endplate osteophytes and there is bulging of the disc.  There is mild stenosis of the lateral recesses and foramina without gross neural compression.  At L4-5, the disc is chronically degenerated with chronic endplate changes, endplate osteophytes and bulging of the disc.  There is narrowing of the lateral recesses and neural foramina.  There be some potential for neural compression at this level.  At L5-S1, there are endplate osteophytes there is mild bulging of the disc.  There is mild facet degeneration  bilaterally.  There is mild foraminal narrowing bilaterally but no definite neural compression.  IMPRESSION: The marrow pattern is somewhat hypercellular in general but is homogeneous, without evidence of diagnosable myeloma lesion.  Ordinary degenerative changes in the lower lumbar spine without definite neural compression.  See above for details at each level.  Original Report Authenticated By: Thomasenia Sales, M.D.   Dg  Bone Survey Met  01/26/2012  *RADIOLOGY REPORT*  Clinical Data: Melanoma.  Evaluation for multiple myeloma.  METASTATIC BONE SURVEY  Comparison: None.  Findings:  The following bones were evaluated:  Lateral skull: There are scattered small rounded lucent areas within the calvarium, best seen posteriorly.  Cannot exclude myeloma lesions.  Cervical spine, AP and lateral views: Advanced degenerative disc disease and facet disease.  Loss of normal cervical lordosis.  No acute bony abnormality or focal lytic lesion.  Thoracic spine, AP and lateral views: Diffuse degenerative changes throughout the thoracic spine.  No fracture, focal lytic lesion or acute bony abnormality.  Lumbar spine, AP and lateral views: Mild rightward scoliosis. Diffuse degenerative disc disease and facet disease, most pronounced in the lower lumbar spine.  No focal lytic lesion or fracture.  Bilateral shoulders, AP views: Degenerative changes in the shoulders bilaterally.  No focal lytic lesion or acute bony abnormality.  Bilateral humeri, AP views: No acute bony abnormality or focal lytic lesion.  Pelvis, AP view: Mild degenerative changes in the hips.  SI joints are symmetric and unremarkable.  No focal lytic lesion or acute bony abnormality.  Bilateral femora, AP views: No focal lytic lesion or acute bony abnormality.  IMPRESSION: The only suspicious findings are small lucent areas within the posterior calvarium, nonspecific.  Cannot exclude small myeloma lesions.  Original Report Authenticated By: Cyndie Chime, M.D.     ASSESSMENT: This is a very pleasant 71 years old white male recently diagnosed with stage II a multiple myeloma. The patient was discussed with Dr. Arbutus Ped. He will proceed with his first cycle of induction chemotherapy with subcutaneous Velcade 1.3 mg read square on days 1, 4, 8 and 11 every 3 weeks in addition the Revlimid 25 mg by mouth daily for 14 days every 3 weeks, as well as dexamethasone 40 mg by mouth given a weekly basis. In addition he will take prophylactic dose Coumadin at 2 mg by mouth daily. As we have received dental clearance from his dentist he will also start on Zometa 4 mg IV monthly beginning today. He'll return in one week for a symptom management visit with a labs as outlined in his standing orders. He will need to receive his second cycle of Revlimid by Friday, 02/24/2012 as [place] of 02[place]27/2012.   He would be treated for a total of 4 cycles followed by repeat bone marrow biopsy and aspirate for evaluation of his disease. If he has a good response to this treatment, I may consider referring him for autologous peripheral blood stem cell transplant.  The patient has several questions and I answered them completely to his satisfaction. He was advised to call me immediately she has any concerning symptoms in the interval.  Laural Benes, Hector Junkins E, PA-C   All questions were answered. The patient knows to call the clinic with any problems, questions or concerns. We can certainly see the patient much sooner if necessary.  I spent 30 minutes counseling the patient face to face. The total time spent in the appointment was 50 minutes.

## 2012-02-13 NOTE — Patient Instructions (Addendum)
[facility] Discharge Instructions for Patients Receiving Chemotherapy  Today you received the following chemotherapy agents velcade, zometa  To help prevent nausea and vomiting after your treatment, we encourage you to take your nausea medication    If you develop nausea and vomiting that is not controlled by your nausea medication, call the clinic. If it is after clinic hours your family physician or the after hours number for the clinic or go to the Emergency Department.   BELOW ARE SYMPTOMS THAT SHOULD BE REPORTED IMMEDIATELY:  *FEVER GREATER THAN 100.5 F  *CHILLS WITH OR WITHOUT FEVER  NAUSEA AND VOMITING THAT IS NOT CONTROLLED WITH YOUR NAUSEA MEDICATION  *UNUSUAL SHORTNESS OF BREATH  *UNUSUAL BRUISING OR BLEEDING  TENDERNESS IN MOUTH AND THROAT WITH OR WITHOUT PRESENCE OF ULCERS  *URINARY PROBLEMS  *BOWEL PROBLEMS  UNUSUAL RASH Items with * indicate a potential emergency and should be followed up as soon as possible.  One of the nurses will contact you 24 hours after your treatment. Please let the nurse know about any problems that you may have experienced. Feel free to call the clinic you have any questions or concerns. The clinic phone number is [phone].   I have been informed and understand all the instructions given to me. I know to contact the clinic, my physician, or go to the Emergency Department if any problems should occur. I do not have any questions at this time, but understand that I may call the clinic during office hours   should I have any questions or need assistance in obtaining follow up care.    __________________________________________  _____________  __________ Signature of Patient or Authorized Representative            Date                   Time    __________________________________________ Nurse's Signature

## 2012-02-13 NOTE — Telephone Encounter (Signed)
appts made and printed for pt aom °

## 2012-02-14 ENCOUNTER — Telehealth: Payer: Self-pay | Admitting: Medical Oncology

## 2012-02-14 NOTE — Telephone Encounter (Signed)
[name] reports his wrist and hands are painful, has chills, feels some nausea and having trouble holding onto things because of the chills.  Asked to hold on while he checks his temperature but he says his wife is not here.  Expects her within twenty minutes and will call back.  Denies problems with peripheral IV site and injection site.  Awaiting return call from patient, asked that he force fluids.

## 2012-02-14 NOTE — Telephone Encounter (Signed)
Message copied by Augusto Garbe on Tue Feb 14, 2012  1:28 PM ------      Message from: Caren Griffins      Created: Mon Feb 13, 2012  4:16 PM      Regarding: chemo f/u       1st velcade, zometa, call cell

## 2012-02-14 NOTE — Telephone Encounter (Signed)
Pt called earlier to report pain in hands. Since then he talked to [facility] with other symptoms including fever, , chills, flu -like symptoms, jaw pain. Per Dr Donnald Garre it is likely the zometa , take pain med, tylenol, increase fluids and call for higher fever or worsening symptoms. Pt instructed on symptom management and when to call.

## 2012-02-14 NOTE — Telephone Encounter (Signed)
Chemo yesterday - today hands ache -taking pain med he has at home. 'Is this normal?"

## 2012-02-15 ENCOUNTER — Telehealth: Payer: Self-pay | Admitting: Medical Oncology

## 2012-02-15 NOTE — Telephone Encounter (Signed)
No fever , feels a little better, wrist and arms  still hurt. He asked if the zometa will be " tweaked next time" ? I tld him i will check with Dr Donnald Garre .

## 2012-02-16 ENCOUNTER — Ambulatory Visit (HOSPITAL_BASED_OUTPATIENT_CLINIC_OR_DEPARTMENT_OTHER): Payer: Medicare Other

## 2012-02-16 ENCOUNTER — Telehealth: Payer: Self-pay | Admitting: *Deleted

## 2012-02-16 ENCOUNTER — Other Ambulatory Visit: Payer: Self-pay | Admitting: *Deleted

## 2012-02-16 VITALS — BP 124/75 | HR 70 | Temp 97.3°F

## 2012-02-16 DIAGNOSIS — C9000 Multiple myeloma not having achieved remission: Secondary | ICD-10-CM

## 2012-02-16 DIAGNOSIS — Z5112 Encounter for antineoplastic immunotherapy: Secondary | ICD-10-CM

## 2012-02-16 MED ORDER — BORTEZOMIB CHEMO SQ INJECTION 3.5 MG (2.5MG/ML)
1.3000 mg/m2 | Freq: Once | INTRAMUSCULAR | Status: AC
  Administered 2012-02-16: 2.75 mg via SUBCUTANEOUS
  Filled 2012-02-16: qty 2.75

## 2012-02-16 MED ORDER — LENALIDOMIDE 25 MG PO CAPS: 25.0000 mg | ORAL_CAPSULE | Freq: Every day | ORAL | Status: DC

## 2012-02-16 MED ORDER — ONDANSETRON HCL 8 MG PO TABS
8.0000 mg | ORAL_TABLET | Freq: Once | ORAL | Status: AC
  Administered 2012-02-16: 8 mg via ORAL

## 2012-02-16 NOTE — Patient Instructions (Signed)
[facility] Discharge Instructions for Patients Receiving Chemotherapy  Today you received the following chemotherapy agents Velcade.  To help prevent nausea and vomiting after your treatment, we encourage you to take your nausea medication as prescribed.   If you develop nausea and vomiting that is not controlled by your nausea medication, call the clinic. If it is after clinic hours your family physician or the after hours number for the clinic or go to the Emergency Department.   BELOW ARE SYMPTOMS THAT SHOULD BE REPORTED IMMEDIATELY:  *FEVER GREATER THAN 100.5 F  *CHILLS WITH OR WITHOUT FEVER  NAUSEA AND VOMITING THAT IS NOT CONTROLLED WITH YOUR NAUSEA MEDICATION  *UNUSUAL SHORTNESS OF BREATH  *UNUSUAL BRUISING OR BLEEDING  TENDERNESS IN MOUTH AND THROAT WITH OR WITHOUT PRESENCE OF ULCERS  *URINARY PROBLEMS  *BOWEL PROBLEMS  UNUSUAL RASH Items with * indicate a potential emergency and should be followed up as soon as possible.  One of the nurses will contact you 24 hours after your treatment. Please let the nurse know about any problems that you may have experienced. Feel free to call the clinic you have any questions or concerns. The clinic phone number is [phone].   I have been informed and understand all the instructions given to me. I know to contact the clinic, my physician, or go to the Emergency Department if any problems should occur. I do not have any questions at this time, but understand that I may call the clinic during office hours   should I have any questions or need assistance in obtaining follow up care.    __________________________________________  _____________  __________ Signature of Patient or Authorized Representative            Date                   Time    __________________________________________ Nurse's Signature    

## 2012-02-16 NOTE — Telephone Encounter (Signed)
Pt called stating that his symptoms are getting better, and his right wrist is still swollen and his right fingers are tingling.  Pt's PIV for the zometa was in his left forearm and subq velcade with given in his right upper arm.  Per Dr Donnald Garre, will continue to monitor and will re-assess when the time comes whether it is appropriate for pt get zometa.  He could try to take some ibprofen Called and spoke to [place] in the pharmacy, she said sometimes slowing down zometa over 30 minutes may help as well.  Informed Dr Donnald Garre of this.  Pt verbalilzed understanding, informed him Dimas Alexandria can re-assess him at her f/u with him on 8/15 to see if symptoms have completely resolved.  Pt also asked for revlimid rx to be processed early due to him going out of town 8/16 and that next week is when his delivery is supposed to be.  Contacted [facility], new auth # obtained, rx faxed to [facility] to begin being processed.  SLJ

## 2012-02-16 NOTE — Progress Notes (Signed)
Patient complains of right wrist swelling, accompanied by pain and tingling in the right fingers. Patient states symptoms occurred the next day after taking Zometa.Tiana Loft, PA notified. Patient instructed to continue taking his pain medication, keep the extremity elevated with ice. If the pain worsens patient is to report to the ED. Patient verbalizes understanding.

## 2012-02-17 ENCOUNTER — Telehealth: Payer: Self-pay | Admitting: Medical Oncology

## 2012-02-17 NOTE — Telephone Encounter (Signed)
Message copied by Charma Igo on Fri Feb 17, 2012 12:22 PM ------      Message from: Caren Griffins      Created: Thu Feb 16, 2012  4:57 PM      Regarding: f/u tomorrow       F/u with pt tomorrow per pharmacy, they think the swollen wrist is r/t zometa...crazy right????            Thanks      [employer]

## 2012-02-17 NOTE — Telephone Encounter (Signed)
Left message to call.

## 2012-02-20 ENCOUNTER — Ambulatory Visit (HOSPITAL_BASED_OUTPATIENT_CLINIC_OR_DEPARTMENT_OTHER): Payer: Medicare Other

## 2012-02-20 ENCOUNTER — Other Ambulatory Visit (HOSPITAL_BASED_OUTPATIENT_CLINIC_OR_DEPARTMENT_OTHER): Payer: Medicare Other | Admitting: Lab

## 2012-02-20 VITALS — BP 129/73 | HR 76 | Temp 98.4°F | Resp 18

## 2012-02-20 DIAGNOSIS — C9000 Multiple myeloma not having achieved remission: Secondary | ICD-10-CM

## 2012-02-20 DIAGNOSIS — Z5112 Encounter for antineoplastic immunotherapy: Secondary | ICD-10-CM

## 2012-02-20 LAB — CBC WITH DIFFERENTIAL/PLATELET
Basophils Absolute: 0 10*3/uL (ref 0.0–0.1)
EOS%: 0.4 % (ref 0.0–7.0)
Eosinophils Absolute: 0 10*3/uL (ref 0.0–0.5)
HCT: 26.1 % — ABNORMAL LOW (ref 38.4–49.9)
HGB: 9.1 g/dL — ABNORMAL LOW (ref 13.0–17.1)
MCH: 32.4 pg (ref 27.2–33.4)
MCV: 93.7 fL (ref 79.3–98.0)
MONO%: 1.6 % (ref 0.0–14.0)
NEUT#: 7.1 10*3/uL — ABNORMAL HIGH (ref 1.5–6.5)
NEUT%: 90.1 % — ABNORMAL HIGH (ref 39.0–75.0)

## 2012-02-20 LAB — COMPREHENSIVE METABOLIC PANEL
AST: 38 U/L — ABNORMAL HIGH (ref 0–37)
Albumin: 2.4 g/dL — ABNORMAL LOW (ref 3.5–5.2)
Alkaline Phosphatase: 48 U/L (ref 39–117)
BUN: 9 mg/dL (ref 6–23)
Calcium: 7.7 mg/dL — ABNORMAL LOW (ref 8.4–10.5)
Chloride: 101 mEq/L (ref 96–112)
Creatinine, Ser: 0.92 mg/dL (ref 0.50–1.35)
Glucose, Bld: 145 mg/dL — ABNORMAL HIGH (ref 70–99)
Potassium: 3.9 mEq/L (ref 3.5–5.3)

## 2012-02-20 LAB — TECHNOLOGIST REVIEW

## 2012-02-20 MED ORDER — BORTEZOMIB CHEMO SQ INJECTION 3.5 MG (2.5MG/ML)
1.3000 mg/m2 | Freq: Once | INTRAMUSCULAR | Status: AC
  Administered 2012-02-20: 2.75 mg via SUBCUTANEOUS
  Filled 2012-02-20: qty 2.75

## 2012-02-20 MED ORDER — ONDANSETRON HCL 8 MG PO TABS
8.0000 mg | ORAL_TABLET | Freq: Once | ORAL | Status: AC
  Administered 2012-02-20: 8 mg via ORAL

## 2012-02-20 NOTE — Patient Instructions (Signed)
[facility] Discharge Instructions for Patients Receiving Chemotherapy  Today you received the following chemotherapy agents Velcade. To help prevent nausea and vomiting after your treatment, we encourage you to take your nausea medication  Per Dr. Arbutus Ped.  If you develop nausea and vomiting that is not controlled by your nausea medication, call the clinic. If it is after clinic hours your family physician or the after hours number for the clinic or go to the Emergency Department.   BELOW ARE SYMPTOMS THAT SHOULD BE REPORTED IMMEDIATELY:  *FEVER GREATER THAN 100.5 F  *CHILLS WITH OR WITHOUT FEVER  NAUSEA AND VOMITING THAT IS NOT CONTROLLED WITH YOUR NAUSEA MEDICATION  *UNUSUAL SHORTNESS OF BREATH  *UNUSUAL BRUISING OR BLEEDING  TENDERNESS IN MOUTH AND THROAT WITH OR WITHOUT PRESENCE OF ULCERS  *URINARY PROBLEMS  *BOWEL PROBLEMS  UNUSUAL RASH Items with * indicate a potential emergency and should be followed up as soon as possible.   Feel free to call the clinic you have any questions or concerns. The clinic phone number is [phone].   I have been informed and understand all the instructions given to me. I know to contact the clinic, my physician, or go to the Emergency Department if any problems should occur. I do not have any questions at this time, but understand that I may call the clinic during office hours   should I have any questions or need assistance in obtaining follow up care.    __________________________________________  _____________  __________ Signature of Patient or Authorized Representative            Date                   Time    __________________________________________ Nurse's Signature

## 2012-02-21 ENCOUNTER — Other Ambulatory Visit: Payer: Self-pay | Admitting: *Deleted

## 2012-02-21 DIAGNOSIS — C9000 Multiple myeloma not having achieved remission: Secondary | ICD-10-CM

## 2012-02-21 MED ORDER — TEMAZEPAM 15 MG PO CAPS: 15.0000 mg | ORAL_CAPSULE | Freq: Every evening | ORAL | Status: DC | PRN

## 2012-02-23 ENCOUNTER — Telehealth: Payer: Self-pay | Admitting: Family Medicine

## 2012-02-23 ENCOUNTER — Other Ambulatory Visit: Payer: Self-pay | Admitting: *Deleted

## 2012-02-23 ENCOUNTER — Encounter: Payer: Self-pay | Admitting: Physician Assistant

## 2012-02-23 ENCOUNTER — Telehealth: Payer: Self-pay | Admitting: Internal Medicine

## 2012-02-23 ENCOUNTER — Ambulatory Visit (HOSPITAL_BASED_OUTPATIENT_CLINIC_OR_DEPARTMENT_OTHER): Payer: Medicare Other | Admitting: Physician Assistant

## 2012-02-23 ENCOUNTER — Ambulatory Visit (HOSPITAL_BASED_OUTPATIENT_CLINIC_OR_DEPARTMENT_OTHER): Payer: Medicare Other

## 2012-02-23 VITALS — BP 109/59 | HR 67 | Temp 97.0°F | Resp 20 | Ht 70.0 in | Wt 201.1 lb

## 2012-02-23 DIAGNOSIS — C9000 Multiple myeloma not having achieved remission: Secondary | ICD-10-CM

## 2012-02-23 DIAGNOSIS — Z5112 Encounter for antineoplastic immunotherapy: Secondary | ICD-10-CM

## 2012-02-23 DIAGNOSIS — M549 Dorsalgia, unspecified: Secondary | ICD-10-CM

## 2012-02-23 DIAGNOSIS — R197 Diarrhea, unspecified: Secondary | ICD-10-CM

## 2012-02-23 DIAGNOSIS — Z7901 Long term (current) use of anticoagulants: Secondary | ICD-10-CM

## 2012-02-23 MED ORDER — ONDANSETRON HCL 8 MG PO TABS
8.0000 mg | ORAL_TABLET | Freq: Once | ORAL | Status: AC
  Administered 2012-02-23: 8 mg via ORAL

## 2012-02-23 MED ORDER — TEMAZEPAM 30 MG PO CAPS: 30.0000 mg | ORAL_CAPSULE | Freq: Every evening | ORAL | Status: DC | PRN

## 2012-02-23 MED ORDER — BORTEZOMIB CHEMO SQ INJECTION 3.5 MG (2.5MG/ML)
1.3000 mg/m2 | Freq: Once | INTRAMUSCULAR | Status: AC
  Administered 2012-02-23: 2.75 mg via SUBCUTANEOUS
  Filled 2012-02-23: qty 2.75

## 2012-02-23 MED ORDER — TEMAZEPAM 30 MG PO CAPS
30.0000 mg | ORAL_CAPSULE | Freq: Every evening | ORAL | Status: DC | PRN
Start: 2012-02-23 — End: 2012-02-23

## 2012-02-23 NOTE — Progress Notes (Signed)
[facility] [phone]   [phone]  OFFICE PROGRESS NOTE  [name],Hector A, MD [name] [place] [address]  DIAGNOSIS: Stage IIA multiple myeloma diagnosed in July of 2013  PRIOR THERAPY: None.   CURRENT THERAPY:  1.  systemic chemotherapy with subcutaneous Velcade 1.3 mg/M2 on days 1, 4, 8 and 11 every 3 weeks in addition to Revlimid 25 mg by mouth daily for 14 days every 3 weeks and dexamethasone 40 mg on [name] weekly basis. 2. Prophylactic dose Coumadin at 2 mg by mouth daily 3. Zometa 4 mg IV given [name] monthly basis for bone disease, status post 1 treatment  INTERVAL HISTORY: [name] 71 y.o. male returns to the clinic today for [name] symptom management visit as he is finishing his first cycle of systemic chemotherapy with subcutaneous Velcade and Revlimid in addition to prophylactic Coumadin. He reports some side effects related to the Revlimid including diarrhea and constipation feeling sluggish as well as back pain. He states is primarily his upper back that is bothering him. He is been taking an occasional hydrocodone tablet 4 when the pain is more severe. He is not currently using the oxycodone for pain. He complains of difficulty sleeping despite Restoril 15 mg, he does not feel as though it is helping him at all. He reports that he is increased his TUMS to 6 tablets per day for the calcium. He is right wrist is feeling significantly better and he is regain some dexterity in his fingers. The swelling has gone down. He feels this is related to the Zometa.   No significant chest pain or shortness breath. He has no weight loss.  No bleeding issues.   MEDICAL HISTORY: Past Medical History  Diagnosis Date  . GERD (gastroesophageal reflux disease)   . Adenomatous polyp of colon 1996  . Arthritis     Osteoarthritis right knee, spine, wrist  . Benign prostatic hypertrophy     (Dr. Vernie Ammons)  . History of chronic prostatitis   .  History of melanoma     Back (Dr. Karlyn Agee); early melanoma R arm 03/2011  . Diverticulosis   . Arthritis of hand, right     Right thumb (Dr. Teressa Senter)  . Degenerative disc disease   . Hemorrhoids   . Erosive esophagitis   . Back ache     r/o Multiple Myeloma    ALLERGIES:  is allergic to penicillins.  MEDICATIONS:  Current Outpatient Prescriptions  Medication Sig Dispense Refill  . ascorbic acid (VITAMIN C) 500 MG tablet Take 500 mg by mouth daily.      Marland Kitchen aspirin 81 MG tablet Take 81 mg by mouth every other day.       . calcium carbonate (TUMS EX) 750 MG chewable tablet Chew 1 tablet by mouth 3 (three) times daily.      . cholecalciferol (VITAMIN D) 1000 UNITS tablet Take 1,000 Units by mouth daily.      Marland Kitchen dexamethasone (DECADRON) 4 MG tablet       . fish oil-omega-3 fatty acids 1000 MG capsule Take 500 mg by mouth daily.      Marland Kitchen glucosamine-chondroitin 500-400 MG tablet Take 1 tablet by mouth 3 (three) times daily.      Marland Kitchen HYDROcodone-acetaminophen (NORCO) 7.5-325 MG per tablet Take 1 tablet by mouth as needed.      Marland Kitchen lenalidomide (REVLIMID) 25 MG capsule Take 1 capsule (25 mg total) by mouth daily.  14 capsule  0  . methocarbamol (ROBAXIN) 500 MG tablet       . Multiple Vitamin (MULTIVITAMIN) capsule Take 1 capsule by mouth daily.      Marland Kitchen oxyCODONE (OXY IR/ROXICODONE) 5 MG immediate release tablet       . pantoprazole (PROTONIX) 40 MG tablet Take 1 tablet (40 mg total) by mouth daily.  90 tablet  3  . Probiotic Product (ALIGN) 4 MG CAPS Take 1 capsule by mouth daily.      . prochlorperazine (COMPAZINE) 10 MG tablet Take 1 tablet (10 mg total) by mouth every 6 (six) hours as needed.  60 tablet  0  . temazepam (RESTORIL) 15 MG capsule Take 1 capsule (15 mg total) by mouth at bedtime as needed for sleep.  15 capsule  0  . temazepam (RESTORIL) 30 MG capsule Take 1 capsule (30 mg total) by mouth at bedtime as needed for sleep.  30 capsule  0  . warfarin (COUMADIN) 2 MG tablet Take 1 tablet  (2 mg total) by mouth daily.  90 tablet  0    SURGICAL HISTORY:  Past Surgical History  Procedure Date  . Total knee replaced 2000    Left knee  . Appendectomy   . Colonoscopy 2009  . Esophagogastroduodenoscopy 2009    mild inflammation at esophagogastric junction  . Excision of melanoma     back ( x3)  . Great toe surgery     bilateral  . Inguinal hernia repair     left, x 2  . Total knee arthroplasty 06/08/2011    Procedure: TOTAL KNEE ARTHROPLASTY;  Surgeon: Loreta Ave, MD;  Location: [facility] OR;  Service: Orthopedics;  Laterality: Right;  osteonics    REVIEW OF SYSTEMS:  [name] comprehensive review of systems was negative except for: Constitutional: positive for night sweats Musculoskeletal: positive for back pain   PHYSICAL EXAMINATION: General appearance: alert, cooperative and no distress Head: Normocephalic, without obvious abnormality, atraumatic Neck: no adenopathy Lymph nodes: Cervical, supraclavicular, and axillary nodes normal. Resp: clear to auscultation bilaterally Back: Tenderness at the lower lumbar vertebrae to palpation Cardio: regular rate and rhythm, S1, S2 normal, no murmur, click, rub or gallop GI: soft, non-tender; bowel sounds normal; no masses,  no organomegaly Extremities: extremities normal, atraumatic, no cyanosis or edema Neurologic: Alert and oriented X 3, normal strength and tone. Normal symmetric reflexes. Normal coordination and gait  ECOG PERFORMANCE STATUS: 0 - Asymptomatic  Blood pressure 109/59, pulse 67, temperature 97 F (36.1 C), temperature source Oral, resp. rate 20, height 5\' 10"  (1.778 m), weight 201 lb 1.6 oz (91.218 kg).  LABORATORY DATA: Lab Results  Component Value Date   WBC 7.9 02/20/2012   HGB 9.1* 02/20/2012   HCT 26.1* 02/20/2012   MCV 93.7 02/20/2012   PLT 222 02/20/2012      Chemistry      Component Value Date/Time   NA 124* 02/20/2012 1317   K 3.9 02/20/2012 1317   CL 101 02/20/2012 1317   CO2 24 02/20/2012 1317    BUN 9 02/20/2012 1317   CREATININE 0.92 02/20/2012 1317   CREATININE 0.98 01/18/2012 0934      Component Value Date/Time   CALCIUM 7.7* 02/20/2012 1317   ALKPHOS 48 02/20/2012 1317   AST 38* 02/20/2012 1317   ALT 33 02/20/2012 1317   BILITOT 0.2* 02/20/2012 1317      BONE MARROW REPORT FINAL DIAGNOSIS Diagnosis Bone Marrow, Aspirate,Biopsy, and Clot, left superior posterior iliac crest - HYPERCELLULAR BONE MARROW WITH  PLASMA CELL NEOPLASM - SEE COMMENT. PERIPHERAL BLOOD: - NORMOCYTIC-NORMOCHROMIC ANEMIA. Diagnosis Note The bone marrow shows increased number of plasma cells estimated at 44% of all cells in the sample associated with interstitial infiltrates and numerous variably sized clusters and sheets in the clot and biopsy sections. Immunohistochemical stains show lambda light chain restriction in plasma cells. The features are consistent with plasma cell neoplasm. (BNS:kh 01-31-12) Guerry Bruin MD Pathologist, Electronic Signature (Case signed 01/31/2012)  RADIOGRAPHIC STUDIES: Dg Chest 1 View  01/20/2012  *RADIOLOGY REPORT*  Clinical Data: Shortness of breath crackles within the bilateral lung bases, history smoking, repeat examination with nipple markers  CHEST - 1 VIEW  Comparison: 01/19/2012; 04/19/2011; 01/04/2011  Findings:  Grossly unchanged borderline enlarged cardiac silhouette and mediastinal contours with mild tortuosity/ectasia of the thoracic aorta.  Previously described nodular opacity overlying the right lower lung is indeed confirmed to represent [name] nipple shadow.  Minimal increase in bibasilar heterogeneous opacities.  No definite pleural effusion or pneumothorax.  Grossly unchanged bones.  IMPRESSION: 1.  Nodular opacity overlying the right lower lung confirmed to represent [name] nipple shadow. 2.  Slightly decreased lung volumes with worsening bibasilar opacities possibly atelectasis.  Original Report Authenticated By: Waynard Reeds, M.D.   Dg Chest 2 View  01/19/2012   *RADIOLOGY REPORT*  Clinical Data: Shortness of breath, audible crackles at the lung bases, smoking history  CHEST - 2 VIEW  Comparison: Chest x-ray of 04/19/2011  Findings: The lungs remain hyperaerated. There is [name] nodular opacity at the right lung base most consistent with nipple shadow.  If necessary clinically, repeat PA view with nipple markers is recommended.  No infiltrate or effusion is seen.  Mediastinal contours are stable.  The heart remains mildly enlarged.  There are degenerative changes throughout the thoracic spine.  IMPRESSION:  1.  Hyperaeration consistent with emphysema.  No active process. 2.  Probable nipple shadow at the right lung base.  Consider repeat view with nipple markers.  Original Report Authenticated By: Juline Patch, M.D.   Mr Lumbar Spine Wo Contrast  02/01/2012  *RADIOLOGY REPORT*  Clinical Data: Back pain.  Possible multiple myeloma.  MRI LUMBAR SPINE WITHOUT CONTRAST  Technique:  Multiplanar and multiecho pulse sequences of the lumbar spine were obtained without intravenous contrast.  Comparison: Radiography 01/26/2012  Findings: There is curvature convex to the right with the apex at L3.  The marrow pattern is homogeneous with the exception of some discogenic changes of the endplates at L4-5 and [name] benign appearing hemangioma within the right side of the S1 vertebral body.  Second smaller hemangioma is present within the right side of the L1 vertebral body.  No evidence of focal myeloma lesion.  There are minor, non compressive disc bulges at L2-3 and above.  No stenosis or neural compression.  At L3-4, there are endplate osteophytes and there is bulging of the disc.  There is mild stenosis of the lateral recesses and foramina without gross neural compression.  At L4-5, the disc is chronically degenerated with chronic endplate changes, endplate osteophytes and bulging of the disc.  There is narrowing of the lateral recesses and neural foramina.  There be some potential for  neural compression at this level.  At L5-S1, there are endplate osteophytes there is mild bulging of the disc.  There is mild facet degeneration bilaterally.  There is mild foraminal narrowing bilaterally but no definite neural compression.  IMPRESSION: The marrow pattern is somewhat hypercellular in general but is homogeneous, without  evidence of diagnosable myeloma lesion.  Ordinary degenerative changes in the lower lumbar spine without definite neural compression.  See above for details at each level.  Original Report Authenticated By: Thomasenia Sales, M.D.   Dg Bone Survey Met  01/26/2012  *RADIOLOGY REPORT*  Clinical Data: Melanoma.  Evaluation for multiple myeloma.  METASTATIC BONE SURVEY  Comparison: None.  Findings:  The following bones were evaluated:  Lateral skull: There are scattered small rounded lucent areas within the calvarium, best seen posteriorly.  Cannot exclude myeloma lesions.  Cervical spine, AP and lateral views: Advanced degenerative disc disease and facet disease.  Loss of normal cervical lordosis.  No acute bony abnormality or focal lytic lesion.  Thoracic spine, AP and lateral views: Diffuse degenerative changes throughout the thoracic spine.  No fracture, focal lytic lesion or acute bony abnormality.  Lumbar spine, AP and lateral views: Mild rightward scoliosis. Diffuse degenerative disc disease and facet disease, most pronounced in the lower lumbar spine.  No focal lytic lesion or fracture.  Bilateral shoulders, AP views: Degenerative changes in the shoulders bilaterally.  No focal lytic lesion or acute bony abnormality.  Bilateral humeri, AP views: No acute bony abnormality or focal lytic lesion.  Pelvis, AP view: Mild degenerative changes in the hips.  SI joints are symmetric and unremarkable.  No focal lytic lesion or acute bony abnormality.  Bilateral femora, AP views: No focal lytic lesion or acute bony abnormality.  IMPRESSION: The only suspicious findings are small lucent areas  within the posterior calvarium, nonspecific.  Cannot exclude small myeloma lesions.  Original Report Authenticated By: Cyndie Chime, M.D.    ASSESSMENT/PLAN: This is [name] very pleasant 71 years old white male recently diagnosed with stage II [name] multiple myeloma. The patient was discussed with Dr. Arbutus Ped. He will continue with his first cycle of induction chemotherapy with subcutaneous Velcade 1.3 mg read square on days 1, 4, 8 and 11 every 3 weeks in addition the Revlimid 25 mg by mouth daily for 14 days every 3 weeks, as well as dexamethasone 40 mg by mouth given [name] weekly basis. In addition he will continue to take prophylactic dose Coumadin at 2 mg by mouth daily. He is status post one monthly treatment with Zometa however had significant pain and swelling in his right wrist after the infusion. He'll followup with Dr. Arbutus Ped in 2 weeks prior to initiating his second cycle of systemic chemotherapy with subcutaneous Velcade, Revlimid, dexamethasone and prophylactic dose Coumadin. He'll discuss with Dr. Arbutus Ped at that followup visit whether or he will continue on Zometa or if there are options for other treatments. His sodium is low at 124 and I've asked him to increase his dietary sodium intake. We will continue to monitor the sodium level closely.  He would be treated for [name] total of 4 cycles followed by repeat bone marrow biopsy and aspirate for evaluation of his disease. If he has [name] good response to this treatment, he may be referred for autologous peripheral blood stem cell transplant.   [name], Hector Barella E, PA-C   All questions were answered. The patient knows to call the clinic with any problems, questions or concerns. We can certainly see the patient much sooner if necessary.  I spent 20 minutes counseling the patient face to face. The total time spent in the appointment was 30 minutes.

## 2012-02-23 NOTE — Patient Instructions (Signed)
[facility] Discharge Instructions for Patients Receiving Chemotherapy  Today you received the following chemotherapy agents: Velcade.  TIf it is after clinic hours your family physician or the after hours number for the clinic or go to the Emergency Department.   BELOW ARE SYMPTOMS THAT SHOULD BE REPORTED IMMEDIATELY:  *FEVER GREATER THAN 100.5 F  *CHILLS WITH OR WITHOUT FEVER  NAUSEA AND VOMITING THAT IS NOT CONTROLLED WITH YOUR NAUSEA MEDICATION  *UNUSUAL SHORTNESS OF BREATH  *UNUSUAL BRUISING OR BLEEDING  TENDERNESS IN MOUTH AND THROAT WITH OR WITHOUT PRESENCE OF ULCERS  *URINARY PROBLEMS  *BOWEL PROBLEMS  UNUSUAL RASH Items with * indicate a potential emergency and should be followed up as soon as possible.  One of the nurses will contact you 24 hours after your treatment. Please let the nurse know about any problems that you may have experienced. Feel free to call the clinic you have any questions or concerns. The clinic phone number is [phone].

## 2012-02-23 NOTE — Telephone Encounter (Signed)
gv pt appt schedule for August/September °

## 2012-02-24 ENCOUNTER — Other Ambulatory Visit: Payer: Self-pay | Admitting: Certified Registered Nurse Anesthetist

## 2012-02-27 ENCOUNTER — Other Ambulatory Visit: Payer: Medicare Other | Admitting: Lab

## 2012-02-27 ENCOUNTER — Ambulatory Visit: Payer: Medicare Other

## 2012-02-28 ENCOUNTER — Other Ambulatory Visit: Payer: Medicare Other | Admitting: Lab

## 2012-02-29 NOTE — Telephone Encounter (Signed)
LM

## 2012-03-05 ENCOUNTER — Other Ambulatory Visit: Payer: Medicare Other | Admitting: Lab

## 2012-03-05 ENCOUNTER — Ambulatory Visit: Payer: Medicare Other

## 2012-03-06 ENCOUNTER — Other Ambulatory Visit: Payer: Medicare Other | Admitting: Lab

## 2012-03-06 ENCOUNTER — Other Ambulatory Visit (HOSPITAL_BASED_OUTPATIENT_CLINIC_OR_DEPARTMENT_OTHER): Payer: Medicare Other | Admitting: Lab

## 2012-03-06 ENCOUNTER — Telehealth: Payer: Self-pay | Admitting: *Deleted

## 2012-03-06 ENCOUNTER — Ambulatory Visit (HOSPITAL_BASED_OUTPATIENT_CLINIC_OR_DEPARTMENT_OTHER): Payer: Medicare Other | Admitting: Internal Medicine

## 2012-03-06 ENCOUNTER — Ambulatory Visit (HOSPITAL_BASED_OUTPATIENT_CLINIC_OR_DEPARTMENT_OTHER): Payer: Medicare Other

## 2012-03-06 VITALS — BP 115/72 | HR 68 | Temp 97.1°F

## 2012-03-06 VITALS — BP 120/64 | HR 71 | Temp 97.6°F | Resp 20 | Ht 70.0 in | Wt 200.1 lb

## 2012-03-06 DIAGNOSIS — G609 Hereditary and idiopathic neuropathy, unspecified: Secondary | ICD-10-CM

## 2012-03-06 DIAGNOSIS — R5381 Other malaise: Secondary | ICD-10-CM

## 2012-03-06 DIAGNOSIS — M25539 Pain in unspecified wrist: Secondary | ICD-10-CM

## 2012-03-06 DIAGNOSIS — C9000 Multiple myeloma not having achieved remission: Secondary | ICD-10-CM

## 2012-03-06 DIAGNOSIS — Z5112 Encounter for antineoplastic immunotherapy: Secondary | ICD-10-CM

## 2012-03-06 LAB — COMPREHENSIVE METABOLIC PANEL (CC13)
ALT: 23 U/L (ref 0–55)
AST: 24 U/L (ref 5–34)
Chloride: 105 mEq/L (ref 98–107)
Creatinine: 1 mg/dL (ref 0.7–1.3)
Sodium: 128 mEq/L — ABNORMAL LOW (ref 136–145)
Total Bilirubin: 0.3 mg/dL (ref 0.20–1.20)
Total Protein: 14.7 g/dL — ABNORMAL HIGH (ref 6.4–8.3)

## 2012-03-06 LAB — CBC WITH DIFFERENTIAL/PLATELET
BASO%: 0.6 % (ref 0.0–2.0)
EOS%: 0.9 % (ref 0.0–7.0)
HCT: 25.9 % — ABNORMAL LOW (ref 38.4–49.9)
MCH: 30.5 pg (ref 27.2–33.4)
MCHC: 32.8 g/dL (ref 32.0–36.0)
MONO#: 1.3 10*3/uL — ABNORMAL HIGH (ref 0.1–0.9)
NEUT%: 60.6 % (ref 39.0–75.0)
RBC: 2.79 10*6/uL — ABNORMAL LOW (ref 4.20–5.82)
WBC: 6.6 10*3/uL (ref 4.0–10.3)
lymph#: 1.2 10*3/uL (ref 0.9–3.3)

## 2012-03-06 LAB — LACTATE DEHYDROGENASE (CC13): LDH: 169 U/L (ref 125–220)

## 2012-03-06 MED ORDER — ONDANSETRON HCL 8 MG PO TABS
8.0000 mg | ORAL_TABLET | Freq: Once | ORAL | Status: AC
  Administered 2012-03-06: 8 mg via ORAL

## 2012-03-06 MED ORDER — BORTEZOMIB CHEMO SQ INJECTION 3.5 MG (2.5MG/ML)
1.3000 mg/m2 | Freq: Once | INTRAMUSCULAR | Status: AC
  Administered 2012-03-06: 2.75 mg via SUBCUTANEOUS
  Filled 2012-03-06: qty 2.75

## 2012-03-06 NOTE — Patient Instructions (Signed)
Your blood count is acceptable for treatment. We'll continue with cycle #2 today as scheduled. Followup visit in 3 weeks.

## 2012-03-06 NOTE — Telephone Encounter (Signed)
Per staff message and POF I have scheduled appts.  [name]  

## 2012-03-06 NOTE — Patient Instructions (Signed)
 Cancer Center Discharge Instructions for Patients Receiving Chemotherapy  Today you received the following chemotherapy agents: Zelcade  To help prevent nausea and vomiting after your treatment, we encourage you to take your nausea medication.   Take it as often as prescribed.    If you develop nausea and vomiting that is not controlled by your nausea medication, call the clinic. If it is after clinic hours your family physician or the after hours number for the clinic or go to the Emergency Department.   BELOW ARE SYMPTOMS THAT SHOULD BE REPORTED IMMEDIATELY:  *FEVER GREATER THAN 100.5 F  *CHILLS WITH OR WITHOUT FEVER  NAUSEA AND VOMITING THAT IS NOT CONTROLLED WITH YOUR NAUSEA MEDICATION  *UNUSUAL SHORTNESS OF BREATH  *UNUSUAL BRUISING OR BLEEDING  TENDERNESS IN MOUTH AND THROAT WITH OR WITHOUT PRESENCE OF ULCERS  *URINARY PROBLEMS  *BOWEL PROBLEMS  UNUSUAL RASH Items with * indicate a potential emergency and should be followed up as soon as possible.  Feel free to call the clinic you have any questions or concerns. The clinic phone number is [phone].   I have been informed and understand all the instructions given to me. I know to contact the clinic, my physician, or go to the Emergency Department if any problems should occur. I do not have any questions at this time, but understand that I may call the clinic during office hours   should I have any questions or need assistance in obtaining follow up care.    __________________________________________  _____________  __________ Signature of Patient or Authorized Representative            Date                   Time    __________________________________________ Nurse's Signature

## 2012-03-06 NOTE — Progress Notes (Signed)
[facility] [phone]   [phone]  OFFICE PROGRESS NOTE  [name],[name], MD [address] [place] [address]  DIAGNOSIS: Stage IIA multiple myeloma diagnosed in July of 2013   PRIOR THERAPY: None.   CURRENT THERAPY:  1. systemic chemotherapy with subcutaneous Velcade 1.3 mg/M2 on days 1, 4, 8 and 11 every 3 weeks in addition to Revlimid 25 mg by mouth daily for 14 days every 3 weeks and dexamethasone 40 mg on a weekly basis. 2. Prophylactic dose Coumadin at 2 mg by mouth daily 3. Zometa 4 mg IV given a monthly basis for bone disease, status post 1 treatment  INTERVAL HISTORY: BHAVIK CABINESS 71 y.o. male returns to the clinic today for followup visit. The patient is feeling fine today with no specific complaints. He has significant arthralgia and pain in his wrist and arms after the Zometa injection but now significantly improved. He also has mild peripheral neuropathy after the first dose of the chemotherapy with Velcade and Revlimid. The patient also continues to complain of insomnia especially after his steroid medications. He is currently on that history 30 mg by mouth each bedtime with minimal improvement in his insomnia. He also has fatigue. He denied having any significant weight loss or night sweats. He denied having any chest pain, shortness of breath, cough or hemoptysis.   MEDICAL HISTORY: Past Medical History  Diagnosis Date  . GERD (gastroesophageal reflux disease)   . Adenomatous polyp of colon 1996  . Arthritis     Osteoarthritis right knee, spine, wrist  . Benign prostatic hypertrophy     (Dr. Vernie Ammons)  . History of chronic prostatitis   . History of melanoma     Back (Dr. Karlyn Agee); early melanoma R arm 03/2011  . Diverticulosis   . Arthritis of hand, right     Right thumb (Dr. Teressa Senter)  . Degenerative disc disease   . Hemorrhoids   . Erosive esophagitis   . Back ache     r/o Multiple Myeloma    ALLERGIES:   is allergic to penicillins.  MEDICATIONS:  Current Outpatient Prescriptions  Medication Sig Dispense Refill  . ascorbic acid (VITAMIN C) 500 MG tablet Take 500 mg by mouth daily.      Marland Kitchen aspirin 81 MG tablet Take 81 mg by mouth every other day.       . calcium carbonate (TUMS EX) 750 MG chewable tablet Chew 1 tablet by mouth 3 (three) times daily.      . cholecalciferol (VITAMIN D) 1000 UNITS tablet Take 1,000 Units by mouth daily.      Marland Kitchen dexamethasone (DECADRON) 4 MG tablet       . fish oil-omega-3 fatty acids 1000 MG capsule Take 500 mg by mouth daily.      Marland Kitchen glucosamine-chondroitin 500-400 MG tablet Take 1 tablet by mouth 3 (three) times daily.      Marland Kitchen HYDROcodone-acetaminophen (NORCO) 7.5-325 MG per tablet Take 1 tablet by mouth as needed.      Marland Kitchen lenalidomide (REVLIMID) 25 MG capsule Take 1 capsule (25 mg total) by mouth daily.  14 capsule  0  . methocarbamol (ROBAXIN) 500 MG tablet       . Multiple Vitamin (MULTIVITAMIN) capsule Take 1 capsule by mouth daily.      Marland Kitchen oxyCODONE (OXY IR/ROXICODONE) 5 MG immediate release tablet       . pantoprazole (PROTONIX) 40 MG tablet Take 1 tablet (40 mg total) by mouth  daily.  90 tablet  3  . Probiotic Product (ALIGN) 4 MG CAPS Take 1 capsule by mouth daily.      . temazepam (RESTORIL) 15 MG capsule Take 1 capsule (15 mg total) by mouth at bedtime as needed for sleep.  15 capsule  0  . temazepam (RESTORIL) 30 MG capsule Take 1 capsule (30 mg total) by mouth at bedtime as needed for sleep.  30 capsule  0  . warfarin (COUMADIN) 2 MG tablet Take 1 tablet (2 mg total) by mouth daily.  90 tablet  0  . prochlorperazine (COMPAZINE) 10 MG tablet Take 1 tablet (10 mg total) by mouth every 6 (six) hours as needed.  60 tablet  0    SURGICAL HISTORY:  Past Surgical History  Procedure Date  . Total knee replaced 2000    Left knee  . Appendectomy   . Colonoscopy 2009  . Esophagogastroduodenoscopy 2009    mild inflammation at esophagogastric junction  .  Excision of melanoma     back ( x3)  . Great toe surgery     bilateral  . Inguinal hernia repair     left, x 2  . Total knee arthroplasty 06/08/2011    Procedure: TOTAL KNEE ARTHROPLASTY;  Surgeon: Loreta Ave, MD;  Location: [facility] OR;  Service: Orthopedics;  Laterality: Right;  osteonics    REVIEW OF SYSTEMS:  A comprehensive review of systems was negative except for: Constitutional: positive for fatigue Musculoskeletal: positive for arthralgias and back pain Behavioral/Psych: positive for Insomnia   PHYSICAL EXAMINATION: General appearance: alert, cooperative and no distress Head: Normocephalic, without obvious abnormality, atraumatic Neck: no adenopathy Lymph nodes: Cervical, supraclavicular, and axillary nodes normal. Resp: clear to auscultation bilaterally Cardio: regular rate and rhythm, S1, S2 normal, no murmur, click, rub or gallop GI: soft, non-tender; bowel sounds normal; no masses,  no organomegaly Extremities: extremities normal, atraumatic, no cyanosis or edema Neurologic: Alert and oriented X 3, normal strength and tone. Normal symmetric reflexes. Normal coordination and gait  ECOG PERFORMANCE STATUS: 1 - Symptomatic but completely ambulatory  Blood pressure 120/64, pulse 71, temperature 97.6 F (36.4 C), temperature source Oral, resp. rate 20, height 5\' 10"  (1.778 m), weight 200 lb 1.6 oz (90.765 kg).  LABORATORY DATA: Lab Results  Component Value Date   WBC 6.6 03/06/2012   HGB 8.5* 03/06/2012   HCT 25.9* 03/06/2012   MCV 92.8 03/06/2012   PLT 268 03/06/2012      Chemistry      Component Value Date/Time   NA 124* 02/20/2012 1317   K 3.9 02/20/2012 1317   CL 101 02/20/2012 1317   CO2 24 02/20/2012 1317   BUN 9 02/20/2012 1317   CREATININE 0.92 02/20/2012 1317   CREATININE 0.98 01/18/2012 0934      Component Value Date/Time   CALCIUM 7.7* 02/20/2012 1317   ALKPHOS 48 02/20/2012 1317   AST 38* 02/20/2012 1317   ALT 33 02/20/2012 1317   BILITOT 0.2* 02/20/2012 1317         RADIOGRAPHIC STUDIES: No results found.  ASSESSMENT: This is a very pleasant 71 years old white male diagnosed with multiple myeloma currently on systemic chemotherapy with Velcade, and Revlimid and Decadron status post 1 cycle. The patient is also on Zometa for his bone disease status post 1 treatment. He is tolerating his treatment fairly well except for fatigue and arthralgia as well as mild peripheral neuropathy and insomnia.  PLAN: We'll proceed with cycle #2 today as scheduled.  For the insomnia advice the patient to take Benadryl 25 mg by mouth each bedtime in addition to Restoril if needed. He will continue on treatment with Zometa on a monthly basis. The patient would come back for followup visit in 3 weeks with the start of the next cycle of his chemotherapy. His fatigue is most likely secondary to anemia. I would consider the patient for packed rbc's transfusion if his hemoglobin less than 8 g/dL.  All questions were answered. The patient knows to call the clinic with any problems, questions or concerns. We can certainly see the patient much sooner if necessary.  I spent 15 minutes counseling the patient face to face. The total time spent in the appointment was 25 minutes.

## 2012-03-06 NOTE — Progress Notes (Signed)
Quick Note:  Call patient with the result and order K Dur 20 meq po qd X 7 days ______ 

## 2012-03-07 ENCOUNTER — Telehealth: Payer: Self-pay | Admitting: Medical Oncology

## 2012-03-07 ENCOUNTER — Other Ambulatory Visit: Payer: Self-pay | Admitting: Medical Oncology

## 2012-03-07 DIAGNOSIS — E876 Hypokalemia: Secondary | ICD-10-CM

## 2012-03-07 DIAGNOSIS — C9000 Multiple myeloma not having achieved remission: Secondary | ICD-10-CM

## 2012-03-07 MED ORDER — LENALIDOMIDE 25 MG PO CAPS
25.0000 mg | ORAL_CAPSULE | Freq: Every day | ORAL | Status: DC
Start: 2012-03-07 — End: 2012-03-30

## 2012-03-07 MED ORDER — POTASSIUM CHLORIDE CRYS ER 20 MEQ PO TBCR: 20.0000 meq | EXTENDED_RELEASE_TABLET | Freq: Every day | ORAL | Status: DC

## 2012-03-07 NOTE — Telephone Encounter (Signed)
Message copied by Charma Igo on Wed Mar 07, 2012 12:52 PM ------      Message from: Si Gaul      Created: Tue Mar 06, 2012  6:29 PM       Call patient with the result and order K Dur 20 meq po qd X 7 days

## 2012-03-07 NOTE — Telephone Encounter (Signed)
Pt stated k dur was not at [facility]. I called [facility] again and rx is ready. I left message that rx is ready.

## 2012-03-07 NOTE — Telephone Encounter (Signed)
Pt called asking about lab results, I called in potassium and pt notified.

## 2012-03-07 NOTE — Telephone Encounter (Signed)
Faxed  revlimid refill. 

## 2012-03-08 ENCOUNTER — Other Ambulatory Visit: Payer: Self-pay | Admitting: Medical Oncology

## 2012-03-08 ENCOUNTER — Telehealth: Payer: Self-pay | Admitting: Internal Medicine

## 2012-03-08 ENCOUNTER — Ambulatory Visit: Payer: Medicare Other

## 2012-03-08 NOTE — Telephone Encounter (Signed)
Fax confirmed

## 2012-03-08 NOTE — Telephone Encounter (Signed)
Talked to patients and he will be here tomorrow for chemo 8/30 and will get his appt calendar

## 2012-03-09 ENCOUNTER — Ambulatory Visit (HOSPITAL_BASED_OUTPATIENT_CLINIC_OR_DEPARTMENT_OTHER): Payer: Medicare Other

## 2012-03-09 VITALS — BP 128/62 | HR 72 | Temp 97.6°F | Resp 20

## 2012-03-09 DIAGNOSIS — Z5112 Encounter for antineoplastic immunotherapy: Secondary | ICD-10-CM

## 2012-03-09 DIAGNOSIS — C9000 Multiple myeloma not having achieved remission: Secondary | ICD-10-CM

## 2012-03-09 MED ORDER — BORTEZOMIB CHEMO SQ INJECTION 3.5 MG (2.5MG/ML)
1.3000 mg/m2 | Freq: Once | INTRAMUSCULAR | Status: AC
  Administered 2012-03-09: 2.75 mg via SUBCUTANEOUS
  Filled 2012-03-09: qty 2.75

## 2012-03-09 MED ORDER — ONDANSETRON HCL 8 MG PO TABS
8.0000 mg | ORAL_TABLET | Freq: Once | ORAL | Status: AC
  Administered 2012-03-09: 8 mg via ORAL

## 2012-03-09 NOTE — Patient Instructions (Signed)
[facility] Discharge Instructions for Patients Receiving Chemotherapy  Today you received the following chemotherapy agents Velcade  To help prevent nausea and vomiting after your treatment, we encourage you to take your nausea medication as directed.   If you develop nausea and vomiting that is not controlled by your nausea medication, call the clinic. If it is after clinic hours your family physician or the after hours number for the clinic or go to the Emergency Department.   BELOW ARE SYMPTOMS THAT SHOULD BE REPORTED IMMEDIATELY:  *FEVER GREATER THAN 100.5 F  *CHILLS WITH OR WITHOUT FEVER  NAUSEA AND VOMITING THAT IS NOT CONTROLLED WITH YOUR NAUSEA MEDICATION  *UNUSUAL SHORTNESS OF BREATH  *UNUSUAL BRUISING OR BLEEDING  TENDERNESS IN MOUTH AND THROAT WITH OR WITHOUT PRESENCE OF ULCERS  *URINARY PROBLEMS  *BOWEL PROBLEMS  UNUSUAL RASH Items with * indicate a potential emergency and should be followed up as soon as possible.  One of the nurses will contact you 24 hours after your treatment. Please let the nurse know about any problems that you may have experienced. Feel free to call the clinic you have any questions or concerns. The clinic phone number is [phone].   I have been informed and understand all the instructions given to me. I know to contact the clinic, my physician, or go to the Emergency Department if any problems should occur. I do not have any questions at this time, but understand that I may call the clinic during office hours   should I have any questions or need assistance in obtaining follow up care.    __________________________________________  _____________  __________ Signature of Patient or Authorized Representative            Date                   Time    __________________________________________ Nurse's Signature    

## 2012-03-13 ENCOUNTER — Other Ambulatory Visit: Payer: Medicare Other | Admitting: Lab

## 2012-03-13 ENCOUNTER — Ambulatory Visit: Payer: Medicare Other | Admitting: Internal Medicine

## 2012-03-13 ENCOUNTER — Other Ambulatory Visit (HOSPITAL_BASED_OUTPATIENT_CLINIC_OR_DEPARTMENT_OTHER): Payer: Medicare Other | Admitting: Lab

## 2012-03-13 ENCOUNTER — Ambulatory Visit (HOSPITAL_BASED_OUTPATIENT_CLINIC_OR_DEPARTMENT_OTHER): Payer: Medicare Other

## 2012-03-13 VITALS — BP 126/63 | HR 67 | Temp 98.2°F | Resp 18

## 2012-03-13 DIAGNOSIS — Z5112 Encounter for antineoplastic immunotherapy: Secondary | ICD-10-CM

## 2012-03-13 DIAGNOSIS — C9000 Multiple myeloma not having achieved remission: Secondary | ICD-10-CM

## 2012-03-13 LAB — CBC WITH DIFFERENTIAL/PLATELET
Basophils Absolute: 0 10*3/uL (ref 0.0–0.1)
EOS%: 2 % (ref 0.0–7.0)
Eosinophils Absolute: 0.2 10*3/uL (ref 0.0–0.5)
HGB: 9.2 g/dL — ABNORMAL LOW (ref 13.0–17.1)
LYMPH%: 12.4 % — ABNORMAL LOW (ref 14.0–49.0)
MCH: 30.5 pg (ref 27.2–33.4)
MCV: 91.4 fL (ref 79.3–98.0)
MONO%: 7.1 % (ref 0.0–14.0)
NEUT#: 7.6 10*3/uL — ABNORMAL HIGH (ref 1.5–6.5)
Platelets: 212 10*3/uL (ref 140–400)
RBC: 3.02 10*6/uL — ABNORMAL LOW (ref 4.20–5.82)

## 2012-03-13 LAB — COMPREHENSIVE METABOLIC PANEL (CC13)
AST: 23 U/L (ref 5–34)
Alkaline Phosphatase: 56 U/L (ref 40–150)
Glucose: 87 mg/dl (ref 70–99)
Potassium: 4 mEq/L (ref 3.5–5.1)
Sodium: 128 mEq/L — ABNORMAL LOW (ref 136–145)
Total Bilirubin: 0.3 mg/dL (ref 0.20–1.20)
Total Protein: 12.7 g/dL — ABNORMAL HIGH (ref 6.4–8.3)

## 2012-03-13 MED ORDER — BORTEZOMIB CHEMO SQ INJECTION 3.5 MG (2.5MG/ML)
1.3000 mg/m2 | Freq: Once | INTRAMUSCULAR | Status: AC
  Administered 2012-03-13: 2.75 mg via SUBCUTANEOUS
  Filled 2012-03-13: qty 2.75

## 2012-03-13 MED ORDER — ZOLEDRONIC ACID 4 MG/5ML IV CONC
4.0000 mg | Freq: Once | INTRAVENOUS | Status: DC
  Filled 2012-03-13: qty 5

## 2012-03-13 MED ORDER — SODIUM CHLORIDE 0.9 % IV SOLN
Freq: Once | INTRAVENOUS | Status: AC
  Administered 2012-03-13: 20 mL/h via INTRAVENOUS

## 2012-03-13 MED ORDER — ONDANSETRON HCL 8 MG PO TABS
8.0000 mg | ORAL_TABLET | Freq: Once | ORAL | Status: AC
  Administered 2012-03-13: 8 mg via ORAL

## 2012-03-13 MED ORDER — ZOLEDRONIC ACID 4 MG/5ML IV CONC
4.0000 mg | Freq: Once | INTRAVENOUS | Status: AC
  Administered 2012-03-13: 4 mg via INTRAVENOUS
  Filled 2012-03-13: qty 5

## 2012-03-14 ENCOUNTER — Encounter: Payer: Self-pay | Admitting: Family Medicine

## 2012-03-14 ENCOUNTER — Ambulatory Visit (INDEPENDENT_AMBULATORY_CARE_PROVIDER_SITE_OTHER): Payer: Medicare Other | Admitting: Family Medicine

## 2012-03-14 ENCOUNTER — Other Ambulatory Visit: Payer: Self-pay | Admitting: Dermatology

## 2012-03-14 VITALS — BP 110/60 | HR 72 | Ht 70.0 in | Wt 194.0 lb

## 2012-03-14 DIAGNOSIS — C9000 Multiple myeloma not having achieved remission: Secondary | ICD-10-CM

## 2012-03-14 DIAGNOSIS — N6310 Unspecified lump in the right breast, unspecified quadrant: Secondary | ICD-10-CM

## 2012-03-14 DIAGNOSIS — N63 Unspecified lump in unspecified breast: Secondary | ICD-10-CM

## 2012-03-14 NOTE — Progress Notes (Signed)
Chief Complaint  Patient presents with  . Advice Only    lipoma around right nipple-Dr.Dan Jones wanted you to take a peek at. Painful periodically.   HPI: Patient presents today, after having seen Dr. Yetta Barre earlier this morning, for evaluation of R chest mass, near nipple, which has been tender.  He reports that Dr. Yetta Barre suggested it may be a lipoma, given that he has them throughout his body, but didn't want to remove it.  He recommended eval here.  Lump has been present x 2-3 months, soreness has been progressive.   On his second round of chemo for multiple myeloma, and seems to be tolerating it okay. He has lost 10 pounds  Past Medical History  Diagnosis Date  . GERD (gastroesophageal reflux disease)   . Adenomatous polyp of colon 1996  . Arthritis     Osteoarthritis right knee, spine, wrist  . Benign prostatic hypertrophy     (Dr. Vernie Ammons)  . History of chronic prostatitis   . History of melanoma     Back (Dr. Karlyn Agee); early melanoma R arm 03/2011  . Diverticulosis   . Arthritis of hand, right     Right thumb (Dr. Teressa Senter)  . Degenerative disc disease   . Hemorrhoids   . Erosive esophagitis   . Back ache     r/o Multiple Myeloma   Current Outpatient Prescriptions on File Prior to Visit  Medication Sig Dispense Refill  . ascorbic acid (VITAMIN C) 500 MG tablet Take 500 mg by mouth daily.      Marland Kitchen aspirin 81 MG tablet Take 81 mg by mouth every other day.       . calcium carbonate (TUMS EX) 750 MG chewable tablet Chew 1 tablet by mouth 3 (three) times daily.      . cholecalciferol (VITAMIN D) 1000 UNITS tablet Take 1,000 Units by mouth daily.      Marland Kitchen dexamethasone (DECADRON) 4 MG tablet Take 40 mg by mouth once a week. Takes on Monday am.      . diphenhydrAMINE (BENADRYL) 25 MG tablet Take 50 mg by mouth every 6 (six) hours as needed.      . fish oil-omega-3 fatty acids 1000 MG capsule Take 500 mg by mouth daily.      Marland Kitchen glucosamine-chondroitin 500-400 MG tablet Take 1 tablet  by mouth 3 (three) times daily.      Marland Kitchen lenalidomide (REVLIMID) 25 MG capsule Take 1 capsule (25 mg total) by mouth daily.  14 capsule  0  . Multiple Vitamin (MULTIVITAMIN) capsule Take 1 capsule by mouth daily.      . pantoprazole (PROTONIX) 40 MG tablet Take 1 tablet (40 mg total) by mouth daily.  90 tablet  3  . potassium chloride SA (K-DUR,KLOR-CON) 20 MEQ tablet Take 1 tablet (20 mEq total) by mouth daily.  7 tablet  0  . Probiotic Product (ALIGN) 4 MG CAPS Take 1 capsule by mouth daily.      . temazepam (RESTORIL) 30 MG capsule Take 1 capsule (30 mg total) by mouth at bedtime as needed for sleep.  30 capsule  0  . HYDROcodone-acetaminophen (NORCO) 7.5-325 MG per tablet Take 1 tablet by mouth as needed.      . methocarbamol (ROBAXIN) 500 MG tablet       . oxyCODONE (OXY IR/ROXICODONE) 5 MG immediate release tablet       . prochlorperazine (COMPAZINE) 10 MG tablet Take 1 tablet (10 mg total) by mouth every 6 (six) hours  as needed.  60 tablet  0  . warfarin (COUMADIN) 2 MG tablet Take 1 tablet (2 mg total) by mouth daily.  90 tablet  0  . DISCONTD: temazepam (RESTORIL) 15 MG capsule Take 1 capsule (15 mg total) by mouth at bedtime as needed for sleep.  15 capsule  0   Allergies  Allergen Reactions  . Penicillins     REACTION: rash   ROS:  Denies fevers, nipple discharge, swollen glands, URI symptoms, GI complaints or other concerns.  PHYSICAL EXAM: BP 110/60  Pulse 72  Ht 5\' 10"  (1.778 m)  Wt 194 lb (87.998 kg)  BMI 27.84 kg/m2 Well developed, pleasant male in no distress R nipple--approximately 1 cm subcutaneous mobile mass at right upper out area of nipple, tender. No nipple discharge, no axillary lymphadenopathy.  ASSESSMENT/PLAN  1. Breast mass, right  US Breast Right, MM Digital Diagnostic Bilat  2. Multiple myeloma     Differential diagnosis includes breast tissue/cyst vs lipoma.  Evaluate with breast u/s, biopsy if suspicious.  If u/s is consistent with lipoma rather than  breast tissue, consider biopsy given tenderness.

## 2012-03-14 NOTE — Patient Instructions (Addendum)
The lump by your nipple is either a fatty tumor, or a breast cyst (or other breast abnormality).  An ultrasound will help differentiate, and let us know if any further evaluation is needed.  Suzette Battiest will set this up for you.

## 2012-03-16 ENCOUNTER — Ambulatory Visit (HOSPITAL_BASED_OUTPATIENT_CLINIC_OR_DEPARTMENT_OTHER): Payer: Medicare Other

## 2012-03-16 ENCOUNTER — Telehealth: Payer: Self-pay | Admitting: Family Medicine

## 2012-03-16 VITALS — BP 117/62 | HR 74 | Temp 98.3°F | Resp 18

## 2012-03-16 DIAGNOSIS — C9000 Multiple myeloma not having achieved remission: Secondary | ICD-10-CM

## 2012-03-16 DIAGNOSIS — Z029 Encounter for administrative examinations, unspecified: Secondary | ICD-10-CM

## 2012-03-16 DIAGNOSIS — Z5112 Encounter for antineoplastic immunotherapy: Secondary | ICD-10-CM

## 2012-03-16 MED ORDER — ONDANSETRON HCL 8 MG PO TABS
8.0000 mg | ORAL_TABLET | Freq: Once | ORAL | Status: AC
  Administered 2012-03-16: 8 mg via ORAL

## 2012-03-16 MED ORDER — BORTEZOMIB CHEMO SQ INJECTION 3.5 MG (2.5MG/ML)
1.3000 mg/m2 | Freq: Once | INTRAMUSCULAR | Status: AC
  Administered 2012-03-16: 2.75 mg via SUBCUTANEOUS
  Filled 2012-03-16: qty 2.75

## 2012-03-16 NOTE — Telephone Encounter (Signed)
LM

## 2012-03-16 NOTE — Patient Instructions (Signed)
[facility] Discharge Instructions for Patients Receiving Chemotherapy  Today you received the following chemotherapy agents Velcade.  To help prevent nausea and vomiting after your treatment, we encourage you to take your nausea medication as prescribed.   If you develop nausea and vomiting that is not controlled by your nausea medication, call the clinic. If it is after clinic hours your family physician or the after hours number for the clinic or go to the Emergency Department.   BELOW ARE SYMPTOMS THAT SHOULD BE REPORTED IMMEDIATELY:  *FEVER GREATER THAN 100.5 F  *CHILLS WITH OR WITHOUT FEVER  NAUSEA AND VOMITING THAT IS NOT CONTROLLED WITH YOUR NAUSEA MEDICATION  *UNUSUAL SHORTNESS OF BREATH  *UNUSUAL BRUISING OR BLEEDING  TENDERNESS IN MOUTH AND THROAT WITH OR WITHOUT PRESENCE OF ULCERS  *URINARY PROBLEMS  *BOWEL PROBLEMS  UNUSUAL RASH Items with * indicate a potential emergency and should be followed up as soon as possible.  One of the nurses will contact you 24 hours after your treatment. Please let the nurse know about any problems that you may have experienced. Feel free to call the clinic you have any questions or concerns. The clinic phone number is [phone].   I have been informed and understand all the instructions given to me. I know to contact the clinic, my physician, or go to the Emergency Department if any problems should occur. I do not have any questions at this time, but understand that I may call the clinic during office hours   should I have any questions or need assistance in obtaining follow up care.    __________________________________________  _____________  __________ Signature of Patient or Authorized Representative            Date                   Time    __________________________________________ Nurse's Signature    

## 2012-03-19 ENCOUNTER — Other Ambulatory Visit: Payer: Medicare Other

## 2012-03-20 ENCOUNTER — Other Ambulatory Visit: Payer: Medicare Other | Admitting: Lab

## 2012-03-20 ENCOUNTER — Ambulatory Visit: Payer: Medicare Other

## 2012-03-20 ENCOUNTER — Other Ambulatory Visit: Payer: Medicare Other

## 2012-03-22 ENCOUNTER — Ambulatory Visit
Admission: RE | Admit: 2012-03-22 | Discharge: 2012-03-22 | Disposition: A | Discharge status: Home / Self Care | Payer: Medicare Other | Source: Ambulatory Visit | Attending: Family Medicine | Admitting: Family Medicine

## 2012-03-22 ENCOUNTER — Telehealth: Payer: Self-pay | Admitting: Medical Oncology

## 2012-03-22 DIAGNOSIS — N6310 Unspecified lump in the right breast, unspecified quadrant: Secondary | ICD-10-CM

## 2012-03-22 NOTE — Telephone Encounter (Signed)
[employer] sent a fax for his medical records.He asked to me to call Allyn Kenner his agent at [phone] for any issues.  I could not confirm that we received a request for records so I called Jillyn Hidden and asked him to send request for records again.

## 2012-03-23 ENCOUNTER — Telehealth: Payer: Self-pay | Admitting: Medical Oncology

## 2012-03-23 NOTE — Telephone Encounter (Addendum)
Had a " rough night " . Pt reports his right wrist has tripled in size , it is very painful describes as throbbing. Marland Kitchen He is icing and elevating it and taking dilaudid . He received Velcade on 9/6 and zometa on 9/3. He was asking if he can take extra steroids - he takes decadron  weekly as part of treatment for MM. He saw Dr Orie Fisherman in June and was told he does not have any cartilage in this wrist. I told him I will call him back after talking to MD. Per Dimas [place] and pharmacist this symptom is probably not related too Zometa infusion or vlecade . I instructed pt to try heat and I will consult with [name] for pain management.

## 2012-03-23 NOTE — Telephone Encounter (Signed)
Pt stated he would like to get a refill on oxycodone with tylenol and pick it up Tuesday . I will ask [name] to address that on Tuesday He also stated he thinks he may have gout  and he took some aleve and it eased the pain.

## 2012-03-26 ENCOUNTER — Ambulatory Visit: Payer: Medicare Other | Admitting: Physician Assistant

## 2012-03-27 ENCOUNTER — Ambulatory Visit (HOSPITAL_BASED_OUTPATIENT_CLINIC_OR_DEPARTMENT_OTHER): Payer: Medicare Other | Admitting: Physician Assistant

## 2012-03-27 ENCOUNTER — Ambulatory Visit (HOSPITAL_BASED_OUTPATIENT_CLINIC_OR_DEPARTMENT_OTHER): Payer: Medicare Other

## 2012-03-27 ENCOUNTER — Other Ambulatory Visit (HOSPITAL_BASED_OUTPATIENT_CLINIC_OR_DEPARTMENT_OTHER): Payer: Medicare Other | Admitting: Lab

## 2012-03-27 ENCOUNTER — Ambulatory Visit: Payer: Medicare Other | Admitting: Physician Assistant

## 2012-03-27 ENCOUNTER — Telehealth: Payer: Self-pay | Admitting: *Deleted

## 2012-03-27 ENCOUNTER — Telehealth: Payer: Self-pay | Admitting: Internal Medicine

## 2012-03-27 ENCOUNTER — Encounter: Payer: Self-pay | Admitting: Physician Assistant

## 2012-03-27 VITALS — BP 113/61 | HR 80 | Temp 97.2°F | Resp 20 | Ht 70.0 in | Wt 199.8 lb

## 2012-03-27 DIAGNOSIS — Z5112 Encounter for antineoplastic immunotherapy: Secondary | ICD-10-CM

## 2012-03-27 DIAGNOSIS — C9000 Multiple myeloma not having achieved remission: Secondary | ICD-10-CM

## 2012-03-27 LAB — URIC ACID (CC13): Uric Acid, Serum: 5.2 mg/dl (ref 2.6–7.4)

## 2012-03-27 LAB — LACTATE DEHYDROGENASE (CC13): LDH: 150 U/L (ref 125–220)

## 2012-03-27 LAB — CBC WITH DIFFERENTIAL/PLATELET
BASO%: 1 % (ref 0.0–2.0)
HCT: 25.2 % — ABNORMAL LOW (ref 38.4–49.9)
LYMPH%: 12.9 % — ABNORMAL LOW (ref 14.0–49.0)
MCHC: 33.6 g/dL (ref 32.0–36.0)
MCV: 94.1 fL (ref 79.3–98.0)
MONO#: 0.9 10*3/uL (ref 0.1–0.9)
MONO%: 11 % (ref 0.0–14.0)
NEUT%: 74.7 % (ref 39.0–75.0)
Platelets: 355 10*3/uL (ref 140–400)
WBC: 8.4 10*3/uL (ref 4.0–10.3)

## 2012-03-27 LAB — COMPREHENSIVE METABOLIC PANEL (CC13)
ALT: 19 U/L (ref 0–55)
Alkaline Phosphatase: 74 U/L (ref 40–150)
CO2: 23 mEq/L (ref 22–29)
Creatinine: 1.1 mg/dL (ref 0.7–1.3)
Glucose: 92 mg/dl (ref 70–99)
Total Bilirubin: 0.2 mg/dL (ref 0.20–1.20)

## 2012-03-27 MED ORDER — ONDANSETRON HCL 8 MG PO TABS
8.0000 mg | ORAL_TABLET | Freq: Once | ORAL | Status: AC
  Administered 2012-03-27: 8 mg via ORAL

## 2012-03-27 MED ORDER — HYDROCODONE-ACETAMINOPHEN 7.5-325 MG PO TABS: 1.0000 | ORAL_TABLET | Freq: Four times a day (QID) | ORAL | Status: DC | PRN

## 2012-03-27 MED ORDER — BORTEZOMIB CHEMO SQ INJECTION 3.5 MG (2.5MG/ML)
1.3000 mg/m2 | Freq: Once | INTRAMUSCULAR | Status: AC
  Administered 2012-03-27: 2.75 mg via SUBCUTANEOUS
  Filled 2012-03-27: qty 2.75

## 2012-03-27 MED ORDER — METHOCARBAMOL 500 MG PO TABS: 500.0000 mg | ORAL_TABLET | Freq: Three times a day (TID) | ORAL | Status: DC

## 2012-03-27 MED ORDER — TEMAZEPAM 30 MG PO CAPS: 30.0000 mg | ORAL_CAPSULE | Freq: Every evening | ORAL | Status: DC | PRN

## 2012-03-27 NOTE — Patient Instructions (Signed)
[facility] Discharge Instructions for Patients Receiving Chemotherapy  Today you received the following chemotherapy agents Velcade.  To help prevent nausea and vomiting after your treatment, we encourage you to take your nausea medication as prescribed.   If you develop nausea and vomiting that is not controlled by your nausea medication, call the clinic. If it is after clinic hours your family physician or the after hours number for the clinic or go to the Emergency Department.   BELOW ARE SYMPTOMS THAT SHOULD BE REPORTED IMMEDIATELY:  *FEVER GREATER THAN 100.5 F  *CHILLS WITH OR WITHOUT FEVER  NAUSEA AND VOMITING THAT IS NOT CONTROLLED WITH YOUR NAUSEA MEDICATION  *UNUSUAL SHORTNESS OF BREATH  *UNUSUAL BRUISING OR BLEEDING  TENDERNESS IN MOUTH AND THROAT WITH OR WITHOUT PRESENCE OF ULCERS  *URINARY PROBLEMS  *BOWEL PROBLEMS  UNUSUAL RASH Items with * indicate a potential emergency and should be followed up as soon as possible.  One of the nurses will contact you 24 hours after your treatment. Please let the nurse know about any problems that you may have experienced. Feel free to call the clinic you have any questions or concerns. The clinic phone number is [phone].   I have been informed and understand all the instructions given to me. I know to contact the clinic, my physician, or go to the Emergency Department if any problems should occur. I do not have any questions at this time, but understand that I may call the clinic during office hours   should I have any questions or need assistance in obtaining follow up care.    __________________________________________  _____________  __________ Signature of Patient or Authorized Representative            Date                   Time    __________________________________________ Nurse's Signature    

## 2012-03-27 NOTE — Progress Notes (Signed)
[facility] [phone]   [phone]  OFFICE PROGRESS NOTE  [name],[name], MD [address] [place] [address]  DIAGNOSIS: Stage IIA multiple myeloma diagnosed in July of 2013   PRIOR THERAPY: None.   CURRENT THERAPY:  1. systemic chemotherapy with subcutaneous Velcade 1.3 mg/M2 on days 1, 4, 8 and 11 every 3 weeks in addition to Revlimid 25 mg by mouth daily for 14 days every 3 weeks and dexamethasone 40 mg on a weekly basis. 2. Prophylactic dose Coumadin at 2 mg by mouth daily 3. Zometa 4 mg IV given a monthly basis for bone disease, status post 2 treatment  INTERVAL HISTORY: [name] 71 y.o. male returns to the clinic today for followup visit.  He has significant arthralgia and pain in his wrist and arms after the Zometa injection, each time he has received it. He reports after his Zometa on September 3 that his right wrist was swollen about "3 times normal". He did contact his orthopedist who felt that his symptoms may be consistent with gout. Patient reports a remote history of gout. Patient was prescribed Indocin although there were some issues with insurance coverage. He did get a small supply of 30 capsules filled however he is been prescribed Celebrex for more long-term use as close to the Indocin. He has been taking the Indocin with a large meal. He requests a refill for his Vicodin for pain management as well as his Robaxin for, also spasms also associated with the pain and swelling. He also has mild peripheral neuropathy after the first dose of the chemotherapy with Velcade and Revlimid. The patient also continues to complain of insomnia especially after his steroid medications. He requests a refill for his Restoril 30 mg tablets. He also has fatigue. He denied having any significant weight loss or night sweats. He denied having any chest pain, shortness of breath, cough or hemoptysis.   MEDICAL HISTORY: Past Medical History    Diagnosis Date  . GERD (gastroesophageal reflux disease)   . Adenomatous polyp of colon 1996  . Arthritis     Osteoarthritis right knee, spine, wrist  . Benign prostatic hypertrophy     (Dr. Vernie Ammons)  . History of chronic prostatitis   . History of melanoma     Back (Dr. Karlyn Agee); early melanoma R arm 03/2011  . Diverticulosis   . Arthritis of hand, right     Right thumb (Dr. Teressa Senter)  . Degenerative disc disease   . Hemorrhoids   . Erosive esophagitis   . Back ache     r/o Multiple Myeloma    ALLERGIES:  is allergic to penicillins.  MEDICATIONS:  Current Outpatient Prescriptions  Medication Sig Dispense Refill  . ascorbic acid (VITAMIN C) 500 MG tablet Take 500 mg by mouth daily.      Marland Kitchen aspirin 81 MG tablet Take 81 mg by mouth every other day.       . bortezomib IV (VELCADE) 3.5 MG injection Inject 1.3 mg/m2 into the vein 2 (two) times a week.      . calcium carbonate (TUMS EX) 750 MG chewable tablet Chew 1 tablet by mouth 3 (three) times daily.      . cholecalciferol (VITAMIN D) 1000 UNITS tablet Take 1,000 Units by mouth daily.      Marland Kitchen dexamethasone (DECADRON) 4 MG tablet Take 40 mg by mouth once a week. Takes on Monday am.      . diphenhydrAMINE (BENADRYL)  25 MG tablet Take 50 mg by mouth every 6 (six) hours as needed.      . fish oil-omega-3 fatty acids 1000 MG capsule Take 500 mg by mouth daily.      Marland Kitchen glucosamine-chondroitin 500-400 MG tablet Take 1 tablet by mouth 3 (three) times daily.      Marland Kitchen HYDROcodone-acetaminophen (NORCO) 7.5-325 MG per tablet Take 1 tablet by mouth every 6 (six) hours as needed.  40 tablet  0  . lenalidomide (REVLIMID) 25 MG capsule Take 1 capsule (25 mg total) by mouth daily.  14 capsule  0  . methocarbamol (ROBAXIN) 500 MG tablet Take 1 tablet (500 mg total) by mouth 3 (three) times daily.  60 tablet  0  . Multiple Vitamin (MULTIVITAMIN) capsule Take 1 capsule by mouth daily.      Marland Kitchen oxyCODONE (OXY IR/ROXICODONE) 5 MG immediate release tablet        . pantoprazole (PROTONIX) 40 MG tablet Take 1 tablet (40 mg total) by mouth daily.  90 tablet  3  . potassium chloride SA (K-DUR,KLOR-CON) 20 MEQ tablet Take 1 tablet (20 mEq total) by mouth daily.  7 tablet  0  . Probiotic Product (ALIGN) 4 MG CAPS Take 1 capsule by mouth daily.      . prochlorperazine (COMPAZINE) 10 MG tablet Take 1 tablet (10 mg total) by mouth every 6 (six) hours as needed.  60 tablet  0  . temazepam (RESTORIL) 30 MG capsule Take 1 capsule (30 mg total) by mouth at bedtime as needed for sleep.  30 capsule  0  . warfarin (COUMADIN) 2 MG tablet Take 1 tablet (2 mg total) by mouth daily.  90 tablet  0  . zolendronic acid (ZOMETA) 4 MG/5ML injection Inject 4 mg into the vein every 28 (twenty-eight) days.      Marland Kitchen DISCONTD: temazepam (RESTORIL) 30 MG capsule Take 1 capsule (30 mg total) by mouth at bedtime as needed for sleep.  30 capsule  0   No current facility-administered medications for this visit.   Facility-Administered Medications Ordered in Other Visits  Medication Dose Route Frequency Provider Last Rate Last Dose  . bortezomib SQ (VELCADE) chemo injection 2.75 mg  1.3 mg/m2 (Treatment Plan Actual) Subcutaneous Once Si Gaul, MD   2.75 mg at 03/27/12 1510  . ondansetron (ZOFRAN) tablet 8 mg  8 mg Oral Once Si Gaul, MD   8 mg at 03/27/12 1447    SURGICAL HISTORY:  Past Surgical History  Procedure Date  . Total knee replaced 2000    Left knee  . Appendectomy   . Colonoscopy 2009  . Esophagogastroduodenoscopy 2009    mild inflammation at esophagogastric junction  . Excision of melanoma     back ( x3)  . Great toe surgery     bilateral  . Inguinal hernia repair     left, x 2  . Total knee arthroplasty 06/08/2011    Procedure: TOTAL KNEE ARTHROPLASTY;  Surgeon: Loreta Ave, MD;  Location: [facility] OR;  Service: Orthopedics;  Laterality: Right;  osteonics    REVIEW OF SYSTEMS:  A comprehensive review of systems was negative except for:  Constitutional: positive for fatigue Musculoskeletal: positive for arthralgias and back pain Behavioral/Psych: positive for Insomnia   PHYSICAL EXAMINATION: General appearance: alert, cooperative and no distress Head: Normocephalic, without obvious abnormality, atraumatic Neck: no adenopathy Lymph nodes: Cervical, supraclavicular, and axillary nodes normal. Resp: clear to auscultation bilaterally Cardio: regular rate and rhythm, S1, S2 normal, no murmur,  click, rub or gallop GI: soft, non-tender; bowel sounds normal; no masses,  no organomegaly Extremities: There is 1+ soft tissue swelling about the thenar prominence into the wrist area on the right as compared to the left. There is no erythema or warmth. Neurologic: Alert and oriented X 3, normal strength and tone. Normal symmetric reflexes. Normal coordination and gait  ECOG PERFORMANCE STATUS: 1 - Symptomatic but completely ambulatory  Blood pressure 113/61, pulse 80, temperature 97.2 F (36.2 C), resp. rate 20, height 5\' 10"  (1.778 m), weight 199 lb 12.8 oz (90.629 kg).  LABORATORY DATA: Lab Results  Component Value Date   WBC 8.4 03/27/2012   HGB 8.5* 03/27/2012   HCT 25.2* 03/27/2012   MCV 94.1 03/27/2012   PLT 355 03/27/2012      Chemistry      Component Value Date/Time   NA 131* 03/27/2012 1326   NA 124* 02/20/2012 1317   K 3.1* 03/27/2012 1326   K 3.9 02/20/2012 1317   CL 107 03/27/2012 1326   CL 101 02/20/2012 1317   CO2 23 03/27/2012 1326   CO2 24 02/20/2012 1317   BUN 12.0 03/27/2012 1326   BUN 9 02/20/2012 1317   CREATININE 1.1 03/27/2012 1326   CREATININE 0.92 02/20/2012 1317   CREATININE 0.98 01/18/2012 0934      Component Value Date/Time   CALCIUM 7.8* 03/27/2012 1326   CALCIUM 7.7* 02/20/2012 1317   ALKPHOS 74 03/27/2012 1326   ALKPHOS 48 02/20/2012 1317   AST 18 03/27/2012 1326   AST 38* 02/20/2012 1317   ALT 19 03/27/2012 1326   ALT 33 02/20/2012 1317   BILITOT 0.20 03/27/2012 1326   BILITOT 0.2* 02/20/2012 1317        RADIOGRAPHIC STUDIES: No results found.  ASSESSMENT/PLAN: This is a very pleasant 71 years old white male diagnosed with multiple myeloma currently on systemic chemotherapy with Velcade, and Revlimid and Decadron status post 2 cycles. The patient is also on Zometa for his bone disease status post 2 treatments. He is tolerating his treatment fairly well except for fatigue and arthralgias as well as mild peripheral neuropathy and insomnia. The patient was discussed with Dr. Gwenyth Bouillon. We will check a uric acid level today to see if it is elevated and may be a contributing cause to his complaints of arthralgias and swelling. Should this be elevated he may benefit from medications such as allopurinol to help manage this issue. His Restoril, Robaxin, and Vicodin were refilled today. He'll followup in 3 weeks with a repeat CBC differential, C. met and LDH. He will be next due for his Zometa on 04/16/2012. Should he experience another episode of significant arthralgias and joint swelling Neulasta and to come in at that time to recheck a uric acid as today's value of 5.2 falls within normal range. His hemoglobin has dropped slightly to 8.5 g/dL however he is currently asymptomatic at this level. We will continue to monitor this closely and consider transfusion if his hemoglobin is less than 8 g/dL.  Laural Benes, Hector Behrendt E, PA-C   All questions were answered. The patient knows to call the clinic with any problems, questions or concerns. We can certainly see the patient much sooner if necessary.  I spent 20 minutes counseling the patient face to face. The total time spent in the appointment was 30 minutes.

## 2012-03-27 NOTE — Telephone Encounter (Signed)
Scheduled patient for labs and ML, emailed [name] regarding chemo, pt opt to get schedule from [name] at chemo room

## 2012-03-27 NOTE — Telephone Encounter (Signed)
Per staff message and POF I have scheduled appts.  JMW  

## 2012-03-27 NOTE — Patient Instructions (Addendum)
Notify our office if he developed another episode of significant joint pain and swelling after your treatment with either Velcade or Zometa. We will have you return to our office to have a uric acid drawn at that time. Continue the Celebrex as needed for anti-inflammatory pain relief. Followup in 3 weeks prior to cycle 4 of her neoadjuvant chemotherapy with Velcade and Revlimid and dexamethasone as well as prophylactic dose Coumadin.

## 2012-03-28 ENCOUNTER — Telehealth: Payer: Self-pay | Admitting: Medical Oncology

## 2012-03-28 DIAGNOSIS — E876 Hypokalemia: Secondary | ICD-10-CM

## 2012-03-28 MED ORDER — POTASSIUM CHLORIDE CRYS ER 20 MEQ PO TBCR: 20.0000 meq | EXTENDED_RELEASE_TABLET | Freq: Every day | ORAL | Status: DC

## 2012-03-28 NOTE — Telephone Encounter (Signed)
Message copied by Charma Igo on Wed Mar 28, 2012  1:21 PM ------      Message from: [place], [place]      Created: Wed Mar 28, 2012  8:36 AM       Call patient with the result and order K Dur 20 meq po qd X 7

## 2012-03-28 NOTE — Progress Notes (Signed)
Quick Note:  Call patient with the result and order K Dur 20 meq po qd X 7 ______ 

## 2012-03-29 ENCOUNTER — Telehealth: Payer: Self-pay | Admitting: Internal Medicine

## 2012-03-29 ENCOUNTER — Ambulatory Visit (HOSPITAL_BASED_OUTPATIENT_CLINIC_OR_DEPARTMENT_OTHER): Payer: Medicare Other

## 2012-03-29 VITALS — BP 112/63 | HR 76 | Temp 98.1°F

## 2012-03-29 DIAGNOSIS — C9000 Multiple myeloma not having achieved remission: Secondary | ICD-10-CM

## 2012-03-29 DIAGNOSIS — Z5112 Encounter for antineoplastic immunotherapy: Secondary | ICD-10-CM

## 2012-03-29 MED ORDER — ONDANSETRON HCL 8 MG PO TABS
8.0000 mg | ORAL_TABLET | Freq: Once | ORAL | Status: AC
Start: 2012-03-29 — End: 2012-03-29
  Administered 2012-03-29: 8 mg via ORAL

## 2012-03-29 MED ORDER — BORTEZOMIB CHEMO SQ INJECTION 3.5 MG (2.5MG/ML)
1.3000 mg/m2 | Freq: Once | INTRAMUSCULAR | Status: AC
  Administered 2012-03-29: 2.75 mg via SUBCUTANEOUS
  Filled 2012-03-29: qty 2.75

## 2012-03-29 NOTE — Telephone Encounter (Signed)
Printed and gv pt appt. °

## 2012-03-29 NOTE — Patient Instructions (Signed)
Call MD for problems 

## 2012-03-30 ENCOUNTER — Telehealth: Payer: Self-pay | Admitting: *Deleted

## 2012-03-30 ENCOUNTER — Other Ambulatory Visit: Payer: Self-pay | Admitting: *Deleted

## 2012-03-30 DIAGNOSIS — C9000 Multiple myeloma not having achieved remission: Secondary | ICD-10-CM

## 2012-03-30 MED ORDER — LENALIDOMIDE 25 MG PO CAPS: 25.0000 mg | ORAL_CAPSULE | Freq: Every day | ORAL | Status: DC

## 2012-03-30 NOTE — Telephone Encounter (Signed)
Left VM for patient to call [employer] and do survey

## 2012-03-30 NOTE — Telephone Encounter (Signed)
Received call from Ruby at [facility] requesting medical records for the past 10 years. Request as well as medical release form given to Tiffany in medical records.  Call back # [phone].  SLJ

## 2012-03-30 NOTE — Telephone Encounter (Signed)
[facility] faxed revlimid refill request.  Request to MD for review.

## 2012-04-02 ENCOUNTER — Other Ambulatory Visit (HOSPITAL_BASED_OUTPATIENT_CLINIC_OR_DEPARTMENT_OTHER): Payer: Medicare Other

## 2012-04-02 ENCOUNTER — Other Ambulatory Visit: Payer: Medicare Other | Admitting: Lab

## 2012-04-02 ENCOUNTER — Ambulatory Visit (HOSPITAL_BASED_OUTPATIENT_CLINIC_OR_DEPARTMENT_OTHER): Payer: Medicare Other

## 2012-04-02 VITALS — BP 129/68 | HR 76 | Temp 97.3°F

## 2012-04-02 DIAGNOSIS — C9000 Multiple myeloma not having achieved remission: Secondary | ICD-10-CM

## 2012-04-02 DIAGNOSIS — Z5112 Encounter for antineoplastic immunotherapy: Secondary | ICD-10-CM

## 2012-04-02 LAB — CBC WITH DIFFERENTIAL/PLATELET
Basophils Absolute: 0 10*3/uL (ref 0.0–0.1)
Eosinophils Absolute: 0.1 10*3/uL (ref 0.0–0.5)
HCT: 27 % — ABNORMAL LOW (ref 38.4–49.9)
HGB: 9 g/dL — ABNORMAL LOW (ref 13.0–17.1)
NEUT#: 7.7 10*3/uL — ABNORMAL HIGH (ref 1.5–6.5)
RDW: 15.8 % — ABNORMAL HIGH (ref 11.0–14.6)
lymph#: 0.8 10*3/uL — ABNORMAL LOW (ref 0.9–3.3)

## 2012-04-02 LAB — COMPREHENSIVE METABOLIC PANEL (CC13)
AST: 18 U/L (ref 5–34)
Albumin: 2.2 g/dL — ABNORMAL LOW (ref 3.5–5.0)
BUN: 12 mg/dL (ref 7.0–26.0)
CO2: 24 mEq/L (ref 22–29)
Calcium: 8.6 mg/dL (ref 8.4–10.4)
Chloride: 104 mEq/L (ref 98–107)
Glucose: 159 mg/dl — ABNORMAL HIGH (ref 70–99)
Potassium: 4 mEq/L (ref 3.5–5.1)

## 2012-04-02 LAB — LACTATE DEHYDROGENASE (CC13): LDH: 134 U/L (ref 125–220)

## 2012-04-02 LAB — TECHNOLOGIST REVIEW

## 2012-04-02 MED ORDER — ONDANSETRON HCL 8 MG PO TABS
8.0000 mg | ORAL_TABLET | Freq: Once | ORAL | Status: AC
  Administered 2012-04-02: 8 mg via ORAL

## 2012-04-02 MED ORDER — BORTEZOMIB CHEMO SQ INJECTION 3.5 MG (2.5MG/ML)
1.3000 mg/m2 | Freq: Once | INTRAMUSCULAR | Status: AC
  Administered 2012-04-02: 2.75 mg via SUBCUTANEOUS
  Filled 2012-04-02: qty 2.75

## 2012-04-02 NOTE — Patient Instructions (Addendum)
[facility] Discharge Instructions for Patients Receiving Chemotherapy  Today you received the following chemotherapy agents Velcade.  To help prevent nausea and vomiting after your treatment, we encourage you to take your nausea medication as prescribed.   If you develop nausea and vomiting that is not controlled by your nausea medication, call the clinic. If it is after clinic hours your family physician or the after hours number for the clinic or go to the Emergency Department.   BELOW ARE SYMPTOMS THAT SHOULD BE REPORTED IMMEDIATELY:  *FEVER GREATER THAN 100.5 F  *CHILLS WITH OR WITHOUT FEVER  NAUSEA AND VOMITING THAT IS NOT CONTROLLED WITH YOUR NAUSEA MEDICATION  *UNUSUAL SHORTNESS OF BREATH  *UNUSUAL BRUISING OR BLEEDING  TENDERNESS IN MOUTH AND THROAT WITH OR WITHOUT PRESENCE OF ULCERS  *URINARY PROBLEMS  *BOWEL PROBLEMS  UNUSUAL RASH Items with * indicate a potential emergency and should be followed up as soon as possible.  One of the nurses will contact you 24 hours after your treatment. Please let the nurse know about any problems that you may have experienced. Feel free to call the clinic you have any questions or concerns. The clinic phone number is [phone].   I have been informed and understand all the instructions given to me. I know to contact the clinic, my physician, or go to the Emergency Department if any problems should occur. I do not have any questions at this time, but understand that I may call the clinic during office hours   should I have any questions or need assistance in obtaining follow up care.    __________________________________________  _____________  __________ Signature of Patient or Authorized Representative            Date                   Time    __________________________________________ Nurse's Signature    

## 2012-04-02 NOTE — Progress Notes (Signed)
[facility] called in on 03/28/12 and pt notified

## 2012-04-03 ENCOUNTER — Other Ambulatory Visit: Payer: Medicare Other | Admitting: Lab

## 2012-04-03 NOTE — Progress Notes (Signed)
This was called in last week and pt was notified by his pharmacy before I called him

## 2012-04-05 ENCOUNTER — Ambulatory Visit (HOSPITAL_BASED_OUTPATIENT_CLINIC_OR_DEPARTMENT_OTHER): Payer: Medicare Other

## 2012-04-05 DIAGNOSIS — C9000 Multiple myeloma not having achieved remission: Secondary | ICD-10-CM

## 2012-04-05 DIAGNOSIS — Z5112 Encounter for antineoplastic immunotherapy: Secondary | ICD-10-CM

## 2012-04-05 MED ORDER — ONDANSETRON HCL 8 MG PO TABS
8.0000 mg | ORAL_TABLET | Freq: Once | ORAL | Status: AC
  Administered 2012-04-05: 8 mg via ORAL

## 2012-04-05 MED ORDER — BORTEZOMIB CHEMO SQ INJECTION 3.5 MG (2.5MG/ML)
1.3000 mg/m2 | Freq: Once | INTRAMUSCULAR | Status: AC
  Administered 2012-04-05: 2.75 mg via SUBCUTANEOUS
  Filled 2012-04-05: qty 2.75

## 2012-04-05 NOTE — Patient Instructions (Signed)
[facility] Discharge Instructions for Patients Receiving Chemotherapy  Today you received the following chemotherapy agents Velcade.  To help prevent nausea and vomiting after your treatment, we encourage you to take your nausea medication as prescribed.   If you develop nausea and vomiting that is not controlled by your nausea medication, call the clinic. If it is after clinic hours your family physician or the after hours number for the clinic or go to the Emergency Department.   BELOW ARE SYMPTOMS THAT SHOULD BE REPORTED IMMEDIATELY:  *FEVER GREATER THAN 100.5 F  *CHILLS WITH OR WITHOUT FEVER  NAUSEA AND VOMITING THAT IS NOT CONTROLLED WITH YOUR NAUSEA MEDICATION  *UNUSUAL SHORTNESS OF BREATH  *UNUSUAL BRUISING OR BLEEDING  TENDERNESS IN MOUTH AND THROAT WITH OR WITHOUT PRESENCE OF ULCERS  *URINARY PROBLEMS  *BOWEL PROBLEMS  UNUSUAL RASH Items with * indicate a potential emergency and should be followed up as soon as possible.  One of the nurses will contact you 24 hours after your treatment. Please let the nurse know about any problems that you may have experienced. Feel free to call the clinic you have any questions or concerns. The clinic phone number is [phone].   I have been informed and understand all the instructions given to me. I know to contact the clinic, my physician, or go to the Emergency Department if any problems should occur. I do not have any questions at this time, but understand that I may call the clinic during office hours   should I have any questions or need assistance in obtaining follow up care.    __________________________________________  _____________  __________ Signature of Patient or Authorized Representative            Date                   Time    __________________________________________ Nurse's Signature    

## 2012-04-09 ENCOUNTER — Other Ambulatory Visit: Payer: Medicare Other | Admitting: Lab

## 2012-04-10 ENCOUNTER — Other Ambulatory Visit: Payer: Medicare Other | Admitting: Lab

## 2012-04-11 ENCOUNTER — Encounter: Payer: Self-pay | Admitting: Internal Medicine

## 2012-04-12 ENCOUNTER — Telehealth: Payer: Self-pay | Admitting: *Deleted

## 2012-04-12 ENCOUNTER — Encounter: Payer: Self-pay | Admitting: Family Medicine

## 2012-04-12 DIAGNOSIS — C9000 Multiple myeloma not having achieved remission: Secondary | ICD-10-CM

## 2012-04-12 DIAGNOSIS — K219 Gastro-esophageal reflux disease without esophagitis: Secondary | ICD-10-CM

## 2012-04-12 DIAGNOSIS — K589 Irritable bowel syndrome without diarrhea: Secondary | ICD-10-CM

## 2012-04-12 NOTE — Telephone Encounter (Signed)
Reviewed my chart e-mail from patient with mid-level.  Verbal order received and read back from Tiana Loft PA-C for him to come in today for STAT BMET. Uric Acid and spot protein urine.   Called patient who says he is in [place] and can't come in today.  Reports the swelling has gone down and the back pain is dissipating.  Thanked me for following up.  Verbal order received and  Read back from Ms. Johnson to make labs STAT for 04-16-2012.

## 2012-04-16 ENCOUNTER — Ambulatory Visit (HOSPITAL_BASED_OUTPATIENT_CLINIC_OR_DEPARTMENT_OTHER): Payer: Medicare Other

## 2012-04-16 ENCOUNTER — Encounter: Payer: Self-pay | Admitting: Family Medicine

## 2012-04-16 ENCOUNTER — Other Ambulatory Visit (HOSPITAL_BASED_OUTPATIENT_CLINIC_OR_DEPARTMENT_OTHER): Payer: Medicare Other | Admitting: Lab

## 2012-04-16 ENCOUNTER — Other Ambulatory Visit: Payer: Medicare Other | Admitting: Lab

## 2012-04-16 VITALS — BP 116/65 | HR 68 | Temp 98.0°F

## 2012-04-16 DIAGNOSIS — Z23 Encounter for immunization: Secondary | ICD-10-CM

## 2012-04-16 DIAGNOSIS — Z5112 Encounter for antineoplastic immunotherapy: Secondary | ICD-10-CM

## 2012-04-16 DIAGNOSIS — K219 Gastro-esophageal reflux disease without esophagitis: Secondary | ICD-10-CM

## 2012-04-16 DIAGNOSIS — K589 Irritable bowel syndrome without diarrhea: Secondary | ICD-10-CM

## 2012-04-16 DIAGNOSIS — Z0000 Encounter for general adult medical examination without abnormal findings: Secondary | ICD-10-CM

## 2012-04-16 DIAGNOSIS — C9000 Multiple myeloma not having achieved remission: Secondary | ICD-10-CM

## 2012-04-16 LAB — COMPREHENSIVE METABOLIC PANEL (CC13)
ALT: 23 U/L (ref 0–55)
Alkaline Phosphatase: 66 U/L (ref 40–150)
CO2: 19 mEq/L — ABNORMAL LOW (ref 22–29)
Sodium: 125 mEq/L — ABNORMAL LOW (ref 136–145)
Total Bilirubin: 0.4 mg/dL (ref 0.20–1.20)
Total Protein: 12 g/dL — ABNORMAL HIGH (ref 6.4–8.3)

## 2012-04-16 LAB — CBC WITH DIFFERENTIAL/PLATELET
BASO%: 1.3 % (ref 0.0–2.0)
LYMPH%: 14.7 % (ref 14.0–49.0)
MCHC: 33.9 g/dL (ref 32.0–36.0)
MONO#: 0.2 10*3/uL (ref 0.1–0.9)
Platelets: 294 10*3/uL (ref 140–400)
RBC: 2.83 10*6/uL — ABNORMAL LOW (ref 4.20–5.82)
WBC: 4.1 10*3/uL (ref 4.0–10.3)

## 2012-04-16 LAB — UA PROTEIN, DIPSTICK - CHCC: Protein, ur: NEGATIVE mg/dL

## 2012-04-16 MED ORDER — INFLUENZA VIRUS VACC SPLIT PF IM SUSP
0.5000 mL | Freq: Once | INTRAMUSCULAR | Status: AC
  Administered 2012-04-16: 0.5 mL via INTRAMUSCULAR
  Filled 2012-04-16: qty 0.5

## 2012-04-16 MED ORDER — SODIUM CHLORIDE 0.9 % IV SOLN
Freq: Once | INTRAVENOUS | Status: AC
  Administered 2012-04-16: 12:00:00 via INTRAVENOUS

## 2012-04-16 MED ORDER — ONDANSETRON HCL 8 MG PO TABS
8.0000 mg | ORAL_TABLET | Freq: Once | ORAL | Status: AC
  Administered 2012-04-16: 8 mg via ORAL

## 2012-04-16 MED ORDER — BORTEZOMIB CHEMO SQ INJECTION 3.5 MG (2.5MG/ML)
1.3000 mg/m2 | Freq: Once | INTRAMUSCULAR | Status: AC
  Administered 2012-04-16: 2.75 mg via SUBCUTANEOUS
  Filled 2012-04-16: qty 2.75

## 2012-04-16 MED ORDER — ZOLEDRONIC ACID 4 MG/5ML IV CONC
4.0000 mg | Freq: Once | INTRAVENOUS | Status: AC
  Administered 2012-04-16: 4 mg via INTRAVENOUS
  Filled 2012-04-16: qty 5

## 2012-04-17 ENCOUNTER — Other Ambulatory Visit: Payer: Medicare Other

## 2012-04-17 ENCOUNTER — Other Ambulatory Visit: Payer: Medicare Other | Admitting: Lab

## 2012-04-17 ENCOUNTER — Other Ambulatory Visit: Payer: Self-pay | Admitting: Physician Assistant

## 2012-04-17 ENCOUNTER — Other Ambulatory Visit: Payer: Self-pay | Admitting: Internal Medicine

## 2012-04-17 DIAGNOSIS — C9000 Multiple myeloma not having achieved remission: Secondary | ICD-10-CM

## 2012-04-17 MED ORDER — WARFARIN SODIUM 2 MG PO TABS: 2.0000 mg | ORAL_TABLET | Freq: Every day | ORAL | Status: DC

## 2012-04-17 MED ORDER — TEMAZEPAM 30 MG PO CAPS: 30.0000 mg | ORAL_CAPSULE | Freq: Every evening | ORAL | Status: DC | PRN

## 2012-04-17 MED ORDER — PROCHLORPERAZINE MALEATE 10 MG PO TABS: 10.0000 mg | ORAL_TABLET | Freq: Four times a day (QID) | ORAL | Status: DC | PRN

## 2012-04-17 MED ORDER — HYDROCODONE-ACETAMINOPHEN 7.5-325 MG PO TABS: 1.0000 | ORAL_TABLET | Freq: Four times a day (QID) | ORAL | Status: DC | PRN

## 2012-04-18 ENCOUNTER — Other Ambulatory Visit: Payer: Self-pay | Admitting: *Deleted

## 2012-04-18 DIAGNOSIS — C9000 Multiple myeloma not having achieved remission: Secondary | ICD-10-CM

## 2012-04-18 MED ORDER — LENALIDOMIDE 25 MG PO CAPS: 25.0000 mg | ORAL_CAPSULE | Freq: Every day | ORAL | Status: DC

## 2012-04-18 NOTE — Telephone Encounter (Signed)
Called patient, left message to call [phone] to complete monthly survey for Revlimid Rx to be active/dispensed.

## 2012-04-19 ENCOUNTER — Ambulatory Visit (HOSPITAL_BASED_OUTPATIENT_CLINIC_OR_DEPARTMENT_OTHER): Payer: Medicare Other

## 2012-04-19 ENCOUNTER — Encounter: Payer: Self-pay | Admitting: Physician Assistant

## 2012-04-19 ENCOUNTER — Other Ambulatory Visit: Payer: Medicare Other | Admitting: Lab

## 2012-04-19 ENCOUNTER — Ambulatory Visit (HOSPITAL_BASED_OUTPATIENT_CLINIC_OR_DEPARTMENT_OTHER): Payer: Medicare Other | Admitting: Physician Assistant

## 2012-04-19 VITALS — BP 119/68 | HR 80 | Temp 96.9°F | Resp 20 | Ht 70.0 in | Wt 200.9 lb

## 2012-04-19 DIAGNOSIS — Z5112 Encounter for antineoplastic immunotherapy: Secondary | ICD-10-CM

## 2012-04-19 DIAGNOSIS — C9000 Multiple myeloma not having achieved remission: Secondary | ICD-10-CM

## 2012-04-19 MED ORDER — BORTEZOMIB CHEMO SQ INJECTION 3.5 MG (2.5MG/ML)
1.3000 mg/m2 | Freq: Once | INTRAMUSCULAR | Status: AC
  Administered 2012-04-19: 2.75 mg via SUBCUTANEOUS
  Filled 2012-04-19: qty 2.75

## 2012-04-19 MED ORDER — ONDANSETRON HCL 8 MG PO TABS
8.0000 mg | ORAL_TABLET | Freq: Once | ORAL | Status: AC
  Administered 2012-04-19: 8 mg via ORAL

## 2012-04-19 NOTE — Patient Instructions (Addendum)
[facility] Discharge Instructions for Patients Receiving Chemotheraphy Today you received the following chemotherapy agents velcade  To help prevent nausea and vomiting after your treatment, we encourage you to take your nausea medication  and take it as often as prescribed.   If you develop nausea and vomiting that is not controlled by your nausea medication, call the clinic. If it is after clinic hours your family physician or the after hours number for the clinic or go to the Emergency Department.   BELOW ARE SYMPTOMS THAT SHOULD BE REPORTED IMMEDIATELY:  *FEVER GREATER THAN 100.5 F  *CHILLS WITH OR WITHOUT FEVER  NAUSEA AND VOMITING THAT IS NOT CONTROLLED WITH YOUR NAUSEA MEDICATION  *UNUSUAL SHORTNESS OF BREATH  *UNUSUAL BRUISING OR BLEEDING  TENDERNESS IN MOUTH AND THROAT WITH OR WITHOUT PRESENCE OF ULCERS  *URINARY PROBLEMS  *BOWEL PROBLEMS  UNUSUAL RASH Items with * indicate a potential emergency and should be followed up as soon as possible.  One of the nurses will contact you 24 hours after your treatment. Please let the nurse know about any problems that you may have experienced. Feel free to call the clinic you have any questions or concerns. The clinic phone number is [phone].   I have been informed and understand all the instructions given to me. I know to contact the clinic, my physician, or go to the Emergency Department if any problems should occur. I do not have any questions at this time, but understand that I may call the clinic during office hours   should I have any questions or need assistance in obtaining follow up care.    __________________________________________  _____________  __________ Signature of Patient or Authorized Representative            Date                   Time    __________________________________________ Nurse's Signature

## 2012-04-19 NOTE — Patient Instructions (Addendum)
Continue with your chemotherapy as scheduled Return as scheduled for repeat protein studies to re-evaluate your disease Follow up with Dr. Arbutus Ped in 3 weeks to discuss the results of your protein studies and schedule your bone marrow biopsy

## 2012-04-20 ENCOUNTER — Telehealth: Payer: Self-pay | Admitting: Internal Medicine

## 2012-04-20 NOTE — Telephone Encounter (Signed)
pt aware of his upcoming appts 10/21  10/28   aom

## 2012-04-23 ENCOUNTER — Telehealth: Payer: Self-pay | Admitting: *Deleted

## 2012-04-23 ENCOUNTER — Other Ambulatory Visit (HOSPITAL_BASED_OUTPATIENT_CLINIC_OR_DEPARTMENT_OTHER): Payer: Medicare Other | Admitting: Lab

## 2012-04-23 ENCOUNTER — Ambulatory Visit (HOSPITAL_BASED_OUTPATIENT_CLINIC_OR_DEPARTMENT_OTHER): Payer: Medicare Other

## 2012-04-23 ENCOUNTER — Encounter: Payer: Self-pay | Admitting: Internal Medicine

## 2012-04-23 VITALS — BP 123/67 | HR 74 | Temp 97.8°F

## 2012-04-23 DIAGNOSIS — C9000 Multiple myeloma not having achieved remission: Secondary | ICD-10-CM

## 2012-04-23 DIAGNOSIS — Z5112 Encounter for antineoplastic immunotherapy: Secondary | ICD-10-CM

## 2012-04-23 LAB — CBC WITH DIFFERENTIAL/PLATELET
Basophils Absolute: 0 10*3/uL (ref 0.0–0.1)
Eosinophils Absolute: 0 10*3/uL (ref 0.0–0.5)
HGB: 9 g/dL — ABNORMAL LOW (ref 13.0–17.1)
NEUT#: 5.4 10*3/uL (ref 1.5–6.5)
RDW: 17.5 % — ABNORMAL HIGH (ref 11.0–14.6)
WBC: 6.2 10*3/uL (ref 4.0–10.3)
lymph#: 0.7 10*3/uL — ABNORMAL LOW (ref 0.9–3.3)

## 2012-04-23 LAB — COMPREHENSIVE METABOLIC PANEL (CC13)
ALT: 18 U/L (ref 0–55)
AST: 17 U/L (ref 5–34)
Albumin: 2.3 g/dL — ABNORMAL LOW (ref 3.5–5.0)
Alkaline Phosphatase: 82 U/L (ref 40–150)
BUN: 11 mg/dL (ref 7.0–26.0)
Calcium: 8.6 mg/dL (ref 8.4–10.4)
Chloride: 107 mEq/L (ref 98–107)
Glucose: 107 mg/dl — ABNORMAL HIGH (ref 70–99)
Potassium: 4.2 mEq/L (ref 3.5–5.1)
Sodium: 132 mEq/L — ABNORMAL LOW (ref 136–145)
Total Protein: 12.1 g/dL — ABNORMAL HIGH (ref 6.4–8.3)

## 2012-04-23 MED ORDER — ONDANSETRON HCL 8 MG PO TABS
8.0000 mg | ORAL_TABLET | Freq: Once | ORAL | Status: AC
  Administered 2012-04-23: 8 mg via ORAL

## 2012-04-23 MED ORDER — BORTEZOMIB CHEMO SQ INJECTION 3.5 MG (2.5MG/ML)
1.3000 mg/m2 | Freq: Once | INTRAMUSCULAR | Status: AC
  Administered 2012-04-23: 2.75 mg via SUBCUTANEOUS
  Filled 2012-04-23: qty 2.75

## 2012-04-23 NOTE — Telephone Encounter (Signed)
Pt in lobby wanting to speak to RN.  Requesting note to be signed by Dr Donnald Garre regarding consent to pt receiving massage at [facility].  Note signed and given to pt.  Pt also wanted to know about revlimid and decadron and if he needs to continue taking these rx's.  Per Dr Donnald Garre, pt has completed 4 cycles of revlimid so he no longer needs to take revlimid or decadron.  Pt verbalized understanding.  SLJ

## 2012-04-26 ENCOUNTER — Ambulatory Visit (HOSPITAL_BASED_OUTPATIENT_CLINIC_OR_DEPARTMENT_OTHER): Payer: Medicare Other

## 2012-04-26 VITALS — BP 115/62 | HR 64 | Temp 98.0°F | Resp 20

## 2012-04-26 DIAGNOSIS — Z5112 Encounter for antineoplastic immunotherapy: Secondary | ICD-10-CM

## 2012-04-26 DIAGNOSIS — C9000 Multiple myeloma not having achieved remission: Secondary | ICD-10-CM

## 2012-04-26 MED ORDER — ONDANSETRON HCL 8 MG PO TABS
8.0000 mg | ORAL_TABLET | Freq: Once | ORAL | Status: AC
  Administered 2012-04-26: 8 mg via ORAL

## 2012-04-26 MED ORDER — BORTEZOMIB CHEMO SQ INJECTION 3.5 MG (2.5MG/ML)
1.3000 mg/m2 | Freq: Once | INTRAMUSCULAR | Status: AC
  Administered 2012-04-26: 2.75 mg via SUBCUTANEOUS
  Filled 2012-04-26: qty 2.75

## 2012-04-26 NOTE — Progress Notes (Signed)
[facility] [phone]   Fax:(336) [phone]  OFFICE PROGRESS NOTE  [name],[name] [address] [place] [address]  DIAGNOSIS: Stage IIA multiple myeloma diagnosed in July of 2013   PRIOR THERAPY: None.   CURRENT THERAPY:  1. systemic chemotherapy with subcutaneous Velcade 1.3 mg/M2 on days 1, 4, 8 and 11 every 3 weeks in addition to Revlimid 25 mg by mouth daily for 14 days every 3 weeks and dexamethasone 40 mg on [name] weekly basis. Status post 3 of 4 planned cycles 2. Prophylactic dose Coumadin at 2 mg by mouth daily 3. Zometa 4 mg IV given [name] monthly basis for bone disease  INTERVAL HISTORY: [name] 71 y.o. male returns to the clinic today for followup visit.  He has significant arthralgia and pain in his wrist and arms after the Zometa infusion, each time he has received it. He states he is taking Celebrex for the joint discomfort and swelling with some relief of these symptoms. He complains of his back hurting all the time. He also reports discomfort in his feet from the top of his mid-foot forward.He also has fatigue. He denied having any significant weight loss or night sweats. He denied having any chest pain, shortness of breath, cough or hemoptysis.   MEDICAL HISTORY: Past Medical History  Diagnosis Date  . GERD (gastroesophageal reflux disease)   . Adenomatous polyp of colon 1996  . Arthritis     Osteoarthritis right knee, spine, wrist  . Benign prostatic hypertrophy     (Dr. Vernie [name])  . History of chronic prostatitis   . History of melanoma     Back (Dr. Karlyn [name]); early melanoma R arm 03/2011  . Diverticulosis   . Arthritis of hand, right     Right thumb (Dr. Teressa [name])  . Degenerative disc disease   . Hemorrhoids   . Erosive esophagitis   . Back ache     r/o Multiple Myeloma    ALLERGIES:  is allergic to penicillins.  MEDICATIONS:  Current Outpatient Prescriptions  Medication Sig Dispense Refill  . ascorbic  acid (VITAMIN C) 500 MG tablet Take 500 mg by mouth daily.      Marland Kitchen aspirin 81 MG tablet Take 81 mg by mouth every other day.       . bortezomib IV (VELCADE) 3.5 MG injection Inject 1.3 mg/m2 into the vein 2 (two) times [name] week.      . calcium carbonate (TUMS EX) 750 MG chewable tablet Chew 1 tablet by mouth 3 (three) times daily.      . CELEBREX 200 MG capsule       . cholecalciferol (VITAMIN D) 1000 UNITS tablet Take 1,000 Units by mouth daily.      Marland Kitchen dexamethasone (DECADRON) 4 MG tablet Take 40 mg by mouth once [name] week. Takes on Monday am.      . diphenhydrAMINE (BENADRYL) 25 MG tablet Take 50 mg by mouth every 6 (six) hours as needed.      . fish oil-omega-3 fatty acids 1000 MG capsule Take 500 mg by mouth daily.      Marland Kitchen glucosamine-chondroitin 500-400 MG tablet Take 1 tablet by mouth 3 (three) times daily.      Marland Kitchen HYDROcodone-acetaminophen (NORCO) 7.5-325 MG per tablet Take 1 tablet by mouth every 6 (six) hours as needed.  40 tablet  0  . lenalidomide (REVLIMID) 25 MG capsule Take 1 capsule (25 mg total) by mouth daily. X 14 days  every 3 weeks  14 capsule  0  . methocarbamol (ROBAXIN) 500 MG tablet Take 1 tablet (500 mg total) by mouth 3 (three) times daily.  60 tablet  0  . Multiple Vitamin (MULTIVITAMIN) capsule Take 1 capsule by mouth daily.      . NORCO 7.5-325 MG per tablet       . oxyCODONE (OXY IR/ROXICODONE) 5 MG immediate release tablet       . pantoprazole (PROTONIX) 40 MG tablet Take 1 tablet (40 mg total) by mouth daily.  90 tablet  3  . potassium chloride SA (K-DUR,KLOR-CON) 20 MEQ tablet Take 1 tablet (20 mEq total) by mouth daily.  7 tablet  0  . potassium chloride SA (K-DUR,KLOR-CON) 20 MEQ tablet Take 1 tablet (20 mEq total) by mouth daily.  7 tablet  0  . Probiotic Product (ALIGN) 4 MG CAPS Take 1 capsule by mouth daily.      . prochlorperazine (COMPAZINE) 10 MG tablet Take 1 tablet (10 mg total) by mouth every 6 (six) hours as needed.  60 tablet  0  . temazepam (RESTORIL) 30 MG  capsule Take 1 capsule (30 mg total) by mouth at bedtime as needed for sleep.  30 capsule  0  . warfarin (COUMADIN) 2 MG tablet Take 1 tablet (2 mg total) by mouth daily.  90 tablet  0  . zolendronic acid (ZOMETA) 4 MG/5ML injection Inject 4 mg into the vein every 28 (twenty-eight) days.       No current facility-administered medications for this visit.   Facility-Administered Medications Ordered in Other Visits  Medication Dose Route Frequency Provider Last Rate Last Dose  . bortezomib SQ (VELCADE) chemo injection 2.75 mg  1.3 mg/m2 (Treatment Plan Actual) Subcutaneous Once Hector Gaul, [name]   2.75 mg at 04/26/12 1029  . ondansetron (ZOFRAN) tablet 8 mg  8 mg Oral Once Hector Gaul, [name]   8 mg at 04/26/12 1015    SURGICAL HISTORY:  Past Surgical History  Procedure Date  . Total knee replaced 2000    Left knee  . Appendectomy   . Colonoscopy 2009  . Esophagogastroduodenoscopy 2009    mild inflammation at esophagogastric junction  . Excision of melanoma     back ( x3)  . Great toe surgery     bilateral  . Inguinal hernia repair     left, x 2  . Total knee arthroplasty 06/08/2011    Procedure: TOTAL KNEE ARTHROPLASTY;  Surgeon: Hector Ave, [name];  Location: [facility] OR;  Service: Orthopedics;  Laterality: Right;  osteonics    REVIEW OF SYSTEMS:  Pertinent items are noted in HPI.   PHYSICAL EXAMINATION: General appearance: alert, cooperative and no distress Head: Normocephalic, without obvious abnormality, atraumatic Neck: no adenopathy Lymph nodes: Cervical, supraclavicular, and axillary nodes normal. Resp: clear to auscultation bilaterally Cardio: regular rate and rhythm, S1, S2 normal, no murmur, click, rub or gallop GI: soft, non-tender; bowel sounds normal; no masses,  no organomegaly Extremities: extremities normal, atraumatic, no cyanosis or edema Neurologic: Alert and oriented X 3, normal strength and tone. Normal symmetric reflexes. Normal coordination and gait  ECOG  PERFORMANCE STATUS: 1 - Symptomatic but completely ambulatory  Blood pressure 119/68, pulse 80, temperature 96.9 F (36.1 C), temperature source Oral, resp. rate 20, height 5\' 10"  (1.778 m), weight 200 lb 14.4 oz (91.128 kg).  LABORATORY DATA: Lab Results  Component Value Date   WBC 6.2 04/23/2012   HGB 9.0* 04/23/2012   HCT 26.5* 04/23/2012  MCV 92.3 04/23/2012   PLT 246 04/23/2012      Chemistry      Component Value Date/Time   NA 132* 04/23/2012 1121   NA 124* 02/20/2012 1317   K 4.2 04/23/2012 1121   K 3.9 02/20/2012 1317   CL 107 04/23/2012 1121   CL 101 02/20/2012 1317   CO2 23 04/23/2012 1121   CO2 24 02/20/2012 1317   BUN 11.0 04/23/2012 1121   BUN 9 02/20/2012 1317   CREATININE 1.1 04/23/2012 1121   CREATININE 0.92 02/20/2012 1317   CREATININE 0.98 01/18/2012 0934      Component Value Date/Time   CALCIUM 8.6 04/23/2012 1121   CALCIUM 7.7* 02/20/2012 1317   ALKPHOS 82 04/23/2012 1121   ALKPHOS 48 02/20/2012 1317   AST 17 04/23/2012 1121   AST 38* 02/20/2012 1317   ALT 18 04/23/2012 1121   ALT 33 02/20/2012 1317   BILITOT 0.40 04/23/2012 1121   BILITOT 0.2* 02/20/2012 1317       RADIOGRAPHIC STUDIES: No results found.  ASSESSMENT/PLAN: This is [name] very pleasant 71 years old white male diagnosed with multiple myeloma currently on systemic chemotherapy with Velcade, and Revlimid and Decadron status post 3 cycles. The patient is also on Zometa for his bone disease status post 2 treatments. He is tolerating his treatment fairly well except for fatigue and arthralgias as well as mild peripheral neuropathy and insomnia. The patient was discussed with Dr. Arbutus Ped. He will complete cycle 4 of his chemotherapy with Velcade, Revlimid and prednisone. He will follow up with Dr. Arbutus Ped in 3 weeks with repeat protein studies to re-evaluate his disease. Dr. Arbutus Ped will schedule the repeat bone marrow biopsy after discussion of the restaging protein studies.  Laural Benes, Warner Laduca E,  PA-C   All questions were answered. The patient knows to call the clinic with any problems, questions or concerns. We can certainly see the patient much sooner if necessary.  I spent 20 minutes counseling the patient face to face. The total time spent in the appointment was 30 minutes.

## 2012-04-30 ENCOUNTER — Other Ambulatory Visit (HOSPITAL_BASED_OUTPATIENT_CLINIC_OR_DEPARTMENT_OTHER): Payer: Medicare Other | Admitting: Lab

## 2012-04-30 DIAGNOSIS — C9000 Multiple myeloma not having achieved remission: Secondary | ICD-10-CM

## 2012-04-30 LAB — COMPREHENSIVE METABOLIC PANEL (CC13)
ALT: 16 U/L (ref 0–55)
Albumin: 2.1 g/dL — ABNORMAL LOW (ref 3.5–5.0)
Alkaline Phosphatase: 64 U/L (ref 40–150)
CO2: 24 mEq/L (ref 22–29)
Glucose: 96 mg/dl (ref 70–99)
Potassium: 4 mEq/L (ref 3.5–5.1)
Sodium: 132 mEq/L — ABNORMAL LOW (ref 136–145)
Total Bilirubin: 0.3 mg/dL (ref 0.20–1.20)
Total Protein: 11.2 g/dL — ABNORMAL HIGH (ref 6.4–8.3)

## 2012-04-30 LAB — CBC WITH DIFFERENTIAL/PLATELET
BASO%: 0.9 % (ref 0.0–2.0)
Eosinophils Absolute: 0.1 10*3/uL (ref 0.0–0.5)
MCHC: 33.3 g/dL (ref 32.0–36.0)
MONO#: 0.3 10*3/uL (ref 0.1–0.9)
NEUT#: 4.3 10*3/uL (ref 1.5–6.5)
RBC: 2.86 10*6/uL — ABNORMAL LOW (ref 4.20–5.82)
RDW: 17.6 % — ABNORMAL HIGH (ref 11.0–14.6)
WBC: 5.3 10*3/uL (ref 4.0–10.3)

## 2012-04-30 LAB — LACTATE DEHYDROGENASE (CC13): LDH: 131 U/L (ref 125–220)

## 2012-04-30 LAB — TECHNOLOGIST REVIEW

## 2012-05-02 LAB — SPEP & IFE WITH QIG
Gamma Globulin: 52.3 % — ABNORMAL HIGH (ref 11.1–18.8)
IgA: 40 mg/dL — ABNORMAL LOW (ref 68–379)
IgG (Immunoglobin G), Serum: 7360 mg/dL — ABNORMAL HIGH (ref 650–1600)
IgM, Serum: 30 mg/dL — ABNORMAL LOW (ref 41–251)
M-Spike, %: 5.52 g/dL
Total Protein, Serum Electrophoresis: 11 g/dL — ABNORMAL HIGH (ref 6.0–8.3)

## 2012-05-02 LAB — BETA 2 MICROGLOBULIN, SERUM: Beta-2 Microglobulin: 4.69 mg/L — ABNORMAL HIGH (ref 1.01–1.73)

## 2012-05-02 LAB — KAPPA/LAMBDA LIGHT CHAINS: Kappa:Lambda Ratio: 0.03 — ABNORMAL LOW (ref 0.26–1.65)

## 2012-05-07 ENCOUNTER — Other Ambulatory Visit: Payer: Self-pay | Admitting: Medical Oncology

## 2012-05-07 ENCOUNTER — Telehealth: Payer: Self-pay | Admitting: Internal Medicine

## 2012-05-07 ENCOUNTER — Telehealth: Payer: Self-pay | Admitting: *Deleted

## 2012-05-07 ENCOUNTER — Ambulatory Visit (HOSPITAL_BASED_OUTPATIENT_CLINIC_OR_DEPARTMENT_OTHER): Payer: Medicare Other | Admitting: Internal Medicine

## 2012-05-07 ENCOUNTER — Telehealth: Payer: Self-pay | Admitting: Medical Oncology

## 2012-05-07 ENCOUNTER — Encounter: Payer: Self-pay | Admitting: Internal Medicine

## 2012-05-07 ENCOUNTER — Other Ambulatory Visit: Payer: Self-pay | Admitting: Internal Medicine

## 2012-05-07 VITALS — BP 108/61 | HR 70 | Temp 97.0°F | Resp 20 | Ht 70.0 in | Wt 198.4 lb

## 2012-05-07 DIAGNOSIS — C9000 Multiple myeloma not having achieved remission: Secondary | ICD-10-CM

## 2012-05-07 DIAGNOSIS — G609 Hereditary and idiopathic neuropathy, unspecified: Secondary | ICD-10-CM

## 2012-05-07 NOTE — Telephone Encounter (Signed)
Per staff message and POF I have scheduled appts.  JMW  

## 2012-05-07 NOTE — Patient Instructions (Signed)
You have partial response on the protein studies today. I would consider repeating a bone marrow biopsy and aspirate and this in 2 weeks for evaluation of her disease. Followup in 3 weeks. Continue Zometa every 4 weeks.

## 2012-05-07 NOTE — Telephone Encounter (Signed)
Scheduled Bone marrow asp and biopsy at [facility]  for nov 8th at 0800. I called [facility] and the answering machine said  not to leave a message, but  to  to call and speak to someone in person. I  notifed [name] of appt date and time and to arrive at 0700 and npo after midnight and to hold blood thinners. He voices understanding. He does not have an appt for his next zometa so I sent in onc treatment request to schedule zometa for 05/14/12 and told him someone will call him with appt. I will ask Judeth Cornfield to call flow in am to schedule cytometry lab  for nov 8th

## 2012-05-07 NOTE — Progress Notes (Signed)
[name] Telephone:(336) [phone]   Fax:(336) [phone]  OFFICE PROGRESS NOTE  Hector A, MD [address] [place] [address]  DIAGNOSIS: Stage IIA multiple myeloma diagnosed in July of 2013   PRIOR THERAPY: systemic chemotherapy with subcutaneous Velcade 1.3 mg/M2 on days 1, 4, 8 and 11 every 3 weeks in addition to Revlimid 25 mg by mouth daily for 14 days every 3 weeks and dexamethasone 40 mg on [name] weekly basis. Status post 4 planned cycles  CURRENT THERAPY:  1. Prophylactic dose Coumadin at 2 mg by mouth daily 2. Zometa 4 mg IV given [name] monthly basis for bone disease  INTERVAL HISTORY: [name] 71 y.o. male returns to the clinic today for followup visit accompanied by his wife. The patient is feeling fine today with no specific complaints. He tolerated the last 4 cycles of systemic chemotherapy with Revlimid, Velcade and dexamethasone fairly well with no significant adverse effects except for mild peripheral neuropathy in the toes. He denied having any significant nausea or vomiting. He denied having any significant weight loss or night sweats. His back pain has significantly improved. He has repeat CBC, comprehensive metabolic panel, LDH and myeloma panel performed recently and he is here today for evaluation and discussion of his lab results.  MEDICAL HISTORY: Past Medical History  Diagnosis Date  . GERD (gastroesophageal reflux disease)   . Adenomatous polyp of colon 1996  . Arthritis     Osteoarthritis right knee, spine, wrist  . Benign prostatic hypertrophy     (Dr. Vernie [name])  . History of chronic prostatitis   . History of melanoma     Back (Dr. Karlyn [name]); early melanoma R arm 03/2011  . Diverticulosis   . Arthritis of hand, right     Right thumb (Dr. Teressa [name])  . Degenerative disc disease   . Hemorrhoids   . Erosive esophagitis   . Back ache     r/o Multiple Myeloma    ALLERGIES:  is allergic to penicillins.  MEDICATIONS:    Current Outpatient Prescriptions  Medication Sig Dispense Refill  . CELEBREX 200 MG capsule       . temazepam (RESTORIL) 30 MG capsule Take 1 capsule (30 mg total) by mouth at bedtime as needed for sleep.  30 capsule  0  . ascorbic acid (VITAMIN C) 500 MG tablet Take 500 mg by mouth daily.      Marland Kitchen aspirin 81 MG tablet Take 81 mg by mouth every other day.       . bortezomib IV (VELCADE) 3.5 MG injection Inject 1.3 mg/m2 into the vein 2 (two) times [name] week.      . calcium carbonate (TUMS EX) 750 MG chewable tablet Chew 1 tablet by mouth 3 (three) times daily.      . cholecalciferol (VITAMIN D) 1000 UNITS tablet Take 1,000 Units by mouth daily.      Marland Kitchen dexamethasone (DECADRON) 4 MG tablet Take 40 mg by mouth once [name] week. Takes on Monday am.      . diphenhydrAMINE (BENADRYL) 25 MG tablet Take 50 mg by mouth every 6 (six) hours as needed.      . fish oil-omega-3 fatty acids 1000 MG capsule Take 500 mg by mouth daily.      Marland Kitchen glucosamine-chondroitin 500-400 MG tablet Take 1 tablet by mouth 3 (three) times daily.      Marland Kitchen HYDROcodone-acetaminophen (NORCO) 7.5-325 MG per tablet Take 1 tablet by mouth every 6 (six) hours  as needed.  40 tablet  0  . lenalidomide (REVLIMID) 25 MG capsule Take 1 capsule (25 mg total) by mouth daily. X 14 days every 3 weeks  14 capsule  0  . methocarbamol (ROBAXIN) 500 MG tablet Take 1 tablet (500 mg total) by mouth 3 (three) times daily.  60 tablet  0  . Multiple Vitamin (MULTIVITAMIN) capsule Take 1 capsule by mouth daily.      . NORCO 7.5-325 MG per tablet       . oxyCODONE (OXY IR/ROXICODONE) 5 MG immediate release tablet       . pantoprazole (PROTONIX) 40 MG tablet Take 1 tablet (40 mg total) by mouth daily.  90 tablet  3  . potassium chloride SA (K-DUR,KLOR-CON) 20 MEQ tablet Take 1 tablet (20 mEq total) by mouth daily.  7 tablet  0  . Probiotic Product (ALIGN) 4 MG CAPS Take 1 capsule by mouth daily.      . prochlorperazine (COMPAZINE) 10 MG tablet Take 1 tablet (10 mg  total) by mouth every 6 (six) hours as needed.  60 tablet  0  . warfarin (COUMADIN) 2 MG tablet Take 1 tablet (2 mg total) by mouth daily.  90 tablet  0  . zolendronic acid (ZOMETA) 4 MG/5ML injection Inject 4 mg into the vein every 28 (twenty-eight) days.      Marland Kitchen DISCONTD: potassium chloride SA (K-DUR,KLOR-CON) 20 MEQ tablet Take 1 tablet (20 mEq total) by mouth daily.  7 tablet  0    SURGICAL HISTORY:  Past Surgical History  Procedure Date  . Total knee replaced 2000    Left knee  . Appendectomy   . Colonoscopy 2009  . Esophagogastroduodenoscopy 2009    mild inflammation at esophagogastric junction  . Excision of melanoma     back ( x3)  . Great toe surgery     bilateral  . Inguinal hernia repair     left, x 2  . Total knee arthroplasty 06/08/2011    Procedure: TOTAL KNEE ARTHROPLASTY;  Surgeon: Hector Ave, MD;  Location: [facility] OR;  Service: Orthopedics;  Laterality: Right;  osteonics    REVIEW OF SYSTEMS:  [name] comprehensive review of systems was negative.   PHYSICAL EXAMINATION: General appearance: alert, cooperative and no distress Head: Normocephalic, without obvious abnormality, atraumatic Neck: no adenopathy Lymph nodes: Cervical, supraclavicular, and axillary nodes normal. Resp: clear to auscultation bilaterally Cardio: regular rate and rhythm, S1, S2 normal, no murmur, click, rub or gallop GI: soft, non-tender; bowel sounds normal; no masses,  no organomegaly Extremities: extremities normal, atraumatic, no cyanosis or edema Neurologic: Alert and oriented X 3, normal strength and tone. Normal symmetric reflexes. Normal coordination and gait  ECOG PERFORMANCE STATUS: 1 - Symptomatic but completely ambulatory  Blood pressure 108/61, pulse 70, temperature 97 F (36.1 C), temperature source Oral, resp. rate 20, height 5\' 10"  (1.778 m), weight 198 lb 6.4 oz (89.994 kg).  LABORATORY DATA: Lab Results  Component Value Date   WBC 5.3 04/30/2012   HGB 8.8* 04/30/2012    HCT 26.4* 04/30/2012   MCV 92.4 04/30/2012   PLT 250 04/30/2012      Chemistry      Component Value Date/Time   NA 132* 04/30/2012 1011   NA 124* 02/20/2012 1317   K 4.0 04/30/2012 1011   K 3.9 02/20/2012 1317   CL 108* 04/30/2012 1011   CL 101 02/20/2012 1317   CO2 24 04/30/2012 1011   CO2 24 02/20/2012 1317  BUN 9.0 04/30/2012 1011   BUN 9 02/20/2012 1317   CREATININE 1.0 04/30/2012 1011   CREATININE 0.92 02/20/2012 1317   CREATININE 0.98 01/18/2012 0934      Component Value Date/Time   CALCIUM 8.3* 04/30/2012 1011   CALCIUM 7.7* 02/20/2012 1317   ALKPHOS 64 04/30/2012 1011   ALKPHOS 48 02/20/2012 1317   AST 15 04/30/2012 1011   AST 38* 02/20/2012 1317   ALT 16 04/30/2012 1011   ALT 33 02/20/2012 1317   BILITOT 0.30 04/30/2012 1011   BILITOT 0.2* 02/20/2012 1317     Myeloma panel showed: Beta-2 microglobulin 4.69, free kappa light chain 2.32, free lambda light chain 83.90, kappa/lambda ratio 0.03, IgG 7360, IgA 40, IgM 30. M spike 5.52%.  RADIOGRAPHIC STUDIES: No results found.  ASSESSMENT: This is [name] very pleasant 71 years old white male with recently diagnosed multiple myeloma status post 4 cycles of systemic chemotherapy with Revlimid, Velcade and Decadron with partial response on the protein study.  PLAN: I have [name] lengthy discussion with the patient and his wife today about his condition. I recommended for him to proceed with the bone marrow biopsy and aspirate to assess his bone marrow response to the 4 cycles of chemotherapy. This would be performed in the next 2 weeks and the patient would come back for followup visit in 3 weeks for evaluation and discussion of his biopsy results and further recommendation regarding treatment of his condition. If he has [name] good response in the bone marrow biopsy, the patient may be considered for referral for stem cell transplant. He would continue on Zometa every 4 weeks as scheduled.  All questions were answered. The patient knows to call  the clinic with any problems, questions or concerns. We can certainly see the patient much sooner if necessary.  I spent 15 minutes counseling the patient face to face. The total time spent in the appointment was 25 minutes.

## 2012-05-07 NOTE — Telephone Encounter (Signed)
Gave pt appt for MD visit 3 weeks from today 05/28/12, emailed Marcelino Duster regarding Zometa, pt will be informed of the Bone Marrow by nurse per MD

## 2012-05-08 ENCOUNTER — Telehealth: Payer: Self-pay | Admitting: Internal Medicine

## 2012-05-08 ENCOUNTER — Telehealth: Payer: Self-pay | Admitting: *Deleted

## 2012-05-08 NOTE — Telephone Encounter (Signed)
l/m with 11/4 appt  aom

## 2012-05-08 NOTE — Telephone Encounter (Signed)
Called [facility], they are aware of BMBX 05/18/12.  SLJ

## 2012-05-08 NOTE — Telephone Encounter (Signed)
Per staff message and POF I have scheduled appt.  JMW  

## 2012-05-11 ENCOUNTER — Other Ambulatory Visit: Payer: Self-pay | Admitting: Physician Assistant

## 2012-05-11 ENCOUNTER — Encounter: Payer: Self-pay | Admitting: Internal Medicine

## 2012-05-11 ENCOUNTER — Telehealth: Payer: Self-pay | Admitting: Medical Oncology

## 2012-05-11 DIAGNOSIS — C9000 Multiple myeloma not having achieved remission: Secondary | ICD-10-CM

## 2012-05-11 MED ORDER — TEMAZEPAM 30 MG PO CAPS: 30.0000 mg | ORAL_CAPSULE | Freq: Every evening | ORAL | Status: DC | PRN

## 2012-05-11 NOTE — Telephone Encounter (Signed)
Pt left message that he has no energy and it has slowed him down a lot . Is there anything he can take? I messaged Dr Arbutus Ped

## 2012-05-11 NOTE — Telephone Encounter (Signed)
Pt called and I told him Dr Arbutus Ped said the B complex tablet is fine to take.

## 2012-05-11 NOTE — Telephone Encounter (Signed)
Per [name] , I left pt a message  to take a vit b complex daily.

## 2012-05-14 ENCOUNTER — Ambulatory Visit (HOSPITAL_BASED_OUTPATIENT_CLINIC_OR_DEPARTMENT_OTHER): Payer: Medicare Other

## 2012-05-14 VITALS — BP 117/65 | HR 74 | Temp 98.0°F | Resp 20

## 2012-05-14 DIAGNOSIS — C9000 Multiple myeloma not having achieved remission: Secondary | ICD-10-CM

## 2012-05-14 MED ORDER — SODIUM CHLORIDE 0.9 % IJ SOLN
3.0000 mL | Freq: Once | INTRAMUSCULAR | Status: DC | PRN
  Filled 2012-05-14: qty 10

## 2012-05-14 MED ORDER — SODIUM CHLORIDE 0.9 % IV SOLN
Freq: Once | INTRAVENOUS | Status: AC
  Administered 2012-05-14: 15:00:00 via INTRAVENOUS

## 2012-05-14 MED ORDER — ZOLEDRONIC ACID 4 MG/5ML IV CONC
4.0000 mg | Freq: Once | INTRAVENOUS | Status: AC
  Administered 2012-05-14: 4 mg via INTRAVENOUS
  Filled 2012-05-14: qty 5

## 2012-05-14 NOTE — Patient Instructions (Signed)
Patient aware of next appointment; discharged home with no complaints. 

## 2012-05-15 ENCOUNTER — Encounter (HOSPITAL_COMMUNITY): Payer: Self-pay | Admitting: Pharmacy Technician

## 2012-05-18 ENCOUNTER — Encounter (HOSPITAL_COMMUNITY): Payer: Self-pay

## 2012-05-18 ENCOUNTER — Ambulatory Visit (HOSPITAL_COMMUNITY)
Admission: RE | Admit: 2012-05-18 | Discharge: 2012-05-18 | Disposition: A | Discharge status: Home / Self Care | Payer: Medicare Other | Source: Ambulatory Visit | Attending: Internal Medicine | Admitting: Internal Medicine

## 2012-05-18 DIAGNOSIS — C9000 Multiple myeloma not having achieved remission: Secondary | ICD-10-CM | POA: Insufficient documentation

## 2012-05-18 LAB — CBC WITH DIFFERENTIAL/PLATELET
Eosinophils Absolute: 0.2 10*3/uL (ref 0.0–0.7)
Eosinophils Relative: 4 % (ref 0–5)
HCT: 29.3 % — ABNORMAL LOW (ref 39.0–52.0)
Hemoglobin: 9.5 g/dL — ABNORMAL LOW (ref 13.0–17.0)
Lymphs Abs: 1.1 10*3/uL (ref 0.7–4.0)
MCH: 30.1 pg (ref 26.0–34.0)
MCHC: 32.4 g/dL (ref 30.0–36.0)
MCV: 92.7 fL (ref 78.0–100.0)
Monocytes Absolute: 0.4 10*3/uL (ref 0.1–1.0)
Monocytes Relative: 9 % (ref 3–12)
Neutrophils Relative %: 63 % (ref 43–77)
RBC: 3.16 MIL/uL — ABNORMAL LOW (ref 4.22–5.81)

## 2012-05-18 MED ORDER — MIDAZOLAM HCL 10 MG/2ML IJ SOLN
INTRAMUSCULAR | Status: AC
  Administered 2012-05-18: 2 mg
  Filled 2012-05-18: qty 2

## 2012-05-18 MED ORDER — SODIUM CHLORIDE 0.9 % IV SOLN
INTRAVENOUS | Status: AC
  Administered 2012-05-18: 08:00:00 via INTRAVENOUS

## 2012-05-18 MED ORDER — MEPERIDINE HCL 50 MG/ML IJ SOLN
INTRAMUSCULAR | Status: AC
  Filled 2012-05-18: qty 1

## 2012-05-18 NOTE — ED Notes (Signed)
Patient is resting comfortably. 

## 2012-05-18 NOTE — ED Notes (Signed)
dsg applied to lt hip area

## 2012-05-18 NOTE — ED Notes (Signed)
Family updated as to patient's status (wife at bedside) 

## 2012-05-18 NOTE — Procedures (Signed)
Bone Marrow Biopsy and Aspiration Procedure Note  Mallampati's class: 1 ASA class: 1 Informed consent was obtained and potential risks including bleeding, infection and pain were reviewed with the patient.   Posterior iliac crest(s) prepped with Betadine.   Lidocaine 2% local anesthesia infiltrated into the subcutaneous tissue.  Left bone marrow biopsy and left bone marrow aspirate was obtained.   The procedure was tolerated well and there were no complications.  Specimens sent for: routine histopathologic stains and sectioning, flow cytometry, cytogenetics and molecular analysis  Physician: Marthe Dant K. 

## 2012-05-18 NOTE — ED Notes (Signed)
dsg cdi 

## 2012-05-18 NOTE — ED Notes (Signed)
Family updated as to patient's status.

## 2012-05-18 NOTE — ED Notes (Signed)
Vital signs stable. 

## 2012-05-18 NOTE — ED Notes (Signed)
Patient denies pain and is resting comfortably.  

## 2012-05-18 NOTE — Sedation Documentation (Signed)
Medication dose calculated and verified for:  Kavari Munsch total of 6mg  versed

## 2012-05-28 ENCOUNTER — Telehealth: Payer: Self-pay | Admitting: Internal Medicine

## 2012-05-28 ENCOUNTER — Ambulatory Visit (HOSPITAL_BASED_OUTPATIENT_CLINIC_OR_DEPARTMENT_OTHER): Payer: Medicare Other | Admitting: Internal Medicine

## 2012-05-28 ENCOUNTER — Telehealth: Payer: Self-pay | Admitting: *Deleted

## 2012-05-28 ENCOUNTER — Ambulatory Visit: Payer: Medicare Other

## 2012-05-28 ENCOUNTER — Encounter: Payer: Self-pay | Admitting: Internal Medicine

## 2012-05-28 ENCOUNTER — Other Ambulatory Visit: Payer: Self-pay | Admitting: Medical Oncology

## 2012-05-28 VITALS — BP 137/76 | HR 65 | Temp 97.5°F | Resp 18 | Ht 70.0 in | Wt 200.1 lb

## 2012-05-28 DIAGNOSIS — C9000 Multiple myeloma not having achieved remission: Secondary | ICD-10-CM

## 2012-05-28 MED ORDER — LENALIDOMIDE 25 MG PO CAPS: 25.0000 mg | ORAL_CAPSULE | Freq: Every day | ORAL | Status: DC

## 2012-05-28 NOTE — Telephone Encounter (Signed)
Faxed revlimid rx to [facility]. Pt notified to take pt survey.

## 2012-05-28 NOTE — Telephone Encounter (Signed)
Gave pt appt for lab, MD and chemo  November and December 20123

## 2012-05-28 NOTE — Telephone Encounter (Signed)
Refaxed Revlimid to correct fax number [phone]

## 2012-05-28 NOTE — Progress Notes (Signed)
[facility] [phone]   [phone]  OFFICE PROGRESS NOTE  [name],[name], MD [address] [place] [address]  DIAGNOSIS: Stage IIA multiple myeloma diagnosed in July of 2013   PRIOR THERAPY: systemic chemotherapy with subcutaneous Velcade 1.3 mg/M2 on days 1, 4, 8 and 11 every 3 weeks in addition to Revlimid 25 mg by mouth daily for 14 days every 3 weeks and dexamethasone 40 mg on a weekly basis. Status post 4 planned cycles   CURRENT THERAPY:  1. Prophylactic dose Coumadin at 2 mg by mouth daily 2. Zometa 4 mg IV given a monthly basis for bone disease  INTERVAL HISTORY: [name] 71 y.o. male returns to the clinic today for followup visit. The patient is feeling fine today with no specific complaints except for aching pain on the right arm. He currently takes ibuprofen several times a day for pain management. He denied having any other significant complaints recently he specifically no significant weight loss or night sweats. He denied having any bleeding issues. He denied having any significant chest pain, shortness breath, cough or hemoptysis. He underwent a bone marrow biopsy and aspirate on 05/18/2012 and he is here today for evaluation and discussion of his biopsy results and treatment options.  MEDICAL HISTORY: Past Medical History  Diagnosis Date  . GERD (gastroesophageal reflux disease)   . Adenomatous polyp of colon 1996  . Arthritis     Osteoarthritis right knee, spine, wrist  . Benign prostatic hypertrophy     (Dr. Vernie Ammons)  . History of chronic prostatitis   . History of melanoma     Back (Dr. Karlyn Agee); early melanoma R arm 03/2011  . Diverticulosis   . Arthritis of hand, right     Right thumb (Dr. Teressa Senter)  . Degenerative disc disease   . Hemorrhoids   . Erosive esophagitis   . Back ache     r/o Multiple Myeloma    ALLERGIES:  is allergic to penicillins.  MEDICATIONS:  Current Outpatient  Prescriptions  Medication Sig Dispense Refill  . Ibuprofen (IBU-200 PO) Take 3 tablets by mouth daily as needed.      Marland Kitchen ascorbic acid (VITAMIN C) 500 MG tablet Take 500 mg by mouth daily.      Marland Kitchen aspirin EC 81 MG tablet Take 81 mg by mouth every other day.      . B Complex-C (B-COMPLEX WITH VITAMIN C) tablet Take 1 tablet by mouth daily.      . bortezomib IV (VELCADE) 3.5 MG injection Inject 1.3 mg/m2 into the vein 2 (two) times a week.      . CELEBREX 200 MG capsule Take 200 mg by mouth daily.       . cholecalciferol (VITAMIN D) 1000 UNITS tablet Take 1,000 Units by mouth daily.      Marland Kitchen dexamethasone (DECADRON) 4 MG tablet Take 40 mg by mouth once a week. Takes on Monday am.      . diphenhydrAMINE (BENADRYL) 25 MG tablet Take 50 mg by mouth every 6 (six) hours as needed. Itching      . fish oil-omega-3 fatty acids 1000 MG capsule Take 1 g by mouth daily.       Marland Kitchen glucosamine-chondroitin 500-400 MG tablet Take 1 tablet by mouth 3 (three) times daily.      Marland Kitchen HYDROcodone-acetaminophen (NORCO) 7.5-325 MG per tablet Take 1 tablet by mouth every 6 (six) hours as needed. Pain      .  lenalidomide (REVLIMID) 25 MG capsule Take 25 mg by mouth daily. X 14 days every 3 weeks      . methocarbamol (ROBAXIN) 500 MG tablet Take 500 mg by mouth 3 (three) times daily as needed. Muscle spasm      . Multiple Vitamin (MULTIVITAMIN) capsule Take 1 capsule by mouth daily.      Marland Kitchen oxyCODONE (OXY IR/ROXICODONE) 5 MG immediate release tablet Take 5 mg by mouth every 8 (eight) hours as needed. Pain      . pantoprazole (PROTONIX) 40 MG tablet Take 40 mg by mouth daily.      . potassium chloride SA (K-DUR,KLOR-CON) 20 MEQ tablet Take 20 mEq by mouth daily.      . Probiotic Product (ALIGN) 4 MG CAPS Take 1 capsule by mouth daily.      . prochlorperazine (COMPAZINE) 10 MG tablet Take 10 mg by mouth every 6 (six) hours as needed. Nausea      . temazepam (RESTORIL) 30 MG capsule Take 30 mg by mouth at bedtime.      Marland Kitchen warfarin  (COUMADIN) 2 MG tablet Take 2 mg by mouth daily.      Marland Kitchen zolendronic acid (ZOMETA) 4 MG/5ML injection Inject 4 mg into the vein every 28 (twenty-eight) days.        SURGICAL HISTORY:  Past Surgical History  Procedure Date  . Total knee replaced 2000    Left knee  . Appendectomy   . Colonoscopy 2009  . Esophagogastroduodenoscopy 2009    mild inflammation at esophagogastric junction  . Excision of melanoma     back ( x3)  . Great toe surgery     bilateral  . Inguinal hernia repair     left, x 2  . Total knee arthroplasty 06/08/2011    Procedure: TOTAL KNEE ARTHROPLASTY;  Surgeon: Loreta Ave, MD;  Location: [facility] [place] [facility] OR;  Service: Orthopedics;  Laterality: Right;  osteonics    REVIEW OF SYSTEMS:  A comprehensive review of systems was negative except for: Musculoskeletal: positive for arthralgias and bone pain   PHYSICAL EXAMINATION: General appearance: alert, cooperative and no distress Head: Normocephalic, without obvious abnormality, atraumatic Neck: no adenopathy Lymph nodes: Cervical, supraclavicular, and axillary nodes normal. Resp: clear to auscultation bilaterally Cardio: regular rate and rhythm, S1, S2 normal, no murmur, click, rub or gallop GI: soft, non-tender; bowel sounds normal; no masses,  no organomegaly Extremities: extremities normal, atraumatic, no cyanosis or edema Neurologic: Alert and oriented X 3, normal strength and tone. Normal symmetric reflexes. Normal coordination and gait  ECOG PERFORMANCE STATUS: 1 - Symptomatic but completely ambulatory  Blood pressure 137/76, pulse 65, temperature 97.5 F (36.4 C), temperature source Oral, resp. rate 18, height 5\' 10"  (1.778 m), weight 200 lb 1.6 oz (90.765 kg).  LABORATORY DATA: Lab Results  Component Value Date   WBC 4.6 05/18/2012   HGB 9.5* 05/18/2012   HCT 29.3* 05/18/2012   MCV 92.7 05/18/2012   PLT 266 05/18/2012      Chemistry      Component Value Date/Time   NA 132* 04/30/2012 1011   NA 124* 02/20/2012  1317   K 4.0 04/30/2012 1011   K 3.9 02/20/2012 1317   CL 108* 04/30/2012 1011   CL 101 02/20/2012 1317   CO2 24 04/30/2012 1011   CO2 24 02/20/2012 1317   BUN 9.0 04/30/2012 1011   BUN 9 02/20/2012 1317   CREATININE 1.0 04/30/2012 1011   CREATININE 0.92 02/20/2012 1317   CREATININE 0.98  01/18/2012 0934      Component Value Date/Time   CALCIUM 8.3* 04/30/2012 1011   CALCIUM 7.7* 02/20/2012 1317   ALKPHOS 64 04/30/2012 1011   ALKPHOS 48 02/20/2012 1317   AST 15 04/30/2012 1011   AST 38* 02/20/2012 1317   ALT 16 04/30/2012 1011   ALT 33 02/20/2012 1317   BILITOT 0.30 04/30/2012 1011   BILITOT 0.2* 02/20/2012 1317     BONE MARROW REPORT FINAL DIAGNOSIS Bone Marrow, Aspirate,Biopsy, and Clot, Left superior posterior iliac - HYPERCELLULAR BONE MARROW FOR AGE WITH RESIDUAL PLASMA CELL NEOPLASM - SEE COMMENT. PERIPHERAL BLOOD: - NORMOCYTIC-NORMOCHROMIC ANEMIA. Diagnosis Note The plasma cells are increased in number, representing 30% in the aspirate, associated with interstitial infiltrates and numerous variably sized aggregates in the clot sections. Immunohistochemical stains show that the plasma cells are lambda light chain restricted consistent with residual plasma cell neoplasm. (BNS:kh 05-22-12) Guerry Bruin MD Pathologist, Electronic Signature (Case signed 05/22/2012)  RADIOGRAPHIC STUDIES: No results found.  ASSESSMENT: This is a very pleasant 71 years old white male with history of multiple myeloma status post 4 cycles of systemic therapy with Revlimid, Velcade and Decadron with partial response on the recent multiple myeloma panel as well as a bone marrow biopsy and aspirate.  PLAN: I have a lengthy discussion with the patient and his wife today about his bone marrow biopsy and aspirate results. I recommended for him to resume her systemic therapy with Velcade, Revlimid and Decadron for 4 more cycles before repeating myeloma panel and considering another bone marrow biopsy and  aspirate for reevaluation of his disease. The patient will continue on Zometa every 28 days as scheduled. He will start the first cycle of this treatment next week. He would come back for followup visit in 4 weeks with the start of cycle #2. He was advised to call me immediately if he has any concerning symptoms in the interval.  All questions were answered. The patient knows to call the clinic with any problems, questions or concerns. We can certainly see the patient much sooner if necessary.  I spent 15 minutes counseling the patient face to face. The total time spent in the appointment was 25 minutes.

## 2012-05-28 NOTE — Telephone Encounter (Signed)
Per staff phone call and POF I have scheduled appts. JMW  

## 2012-05-28 NOTE — Telephone Encounter (Signed)
Gave pt appt for lab and MD for November and December 2013

## 2012-05-29 ENCOUNTER — Other Ambulatory Visit: Payer: Self-pay | Admitting: *Deleted

## 2012-05-29 DIAGNOSIS — C9000 Multiple myeloma not having achieved remission: Secondary | ICD-10-CM

## 2012-05-29 MED ORDER — WARFARIN SODIUM 2 MG PO TABS: 2.0000 mg | ORAL_TABLET | Freq: Every day | ORAL | Status: DC

## 2012-05-29 MED ORDER — DEXAMETHASONE 4 MG PO TABS: 40.0000 mg | ORAL_TABLET | ORAL | Status: DC

## 2012-06-05 ENCOUNTER — Other Ambulatory Visit (HOSPITAL_BASED_OUTPATIENT_CLINIC_OR_DEPARTMENT_OTHER): Payer: Medicare Other | Admitting: Lab

## 2012-06-05 ENCOUNTER — Telehealth: Payer: Self-pay | Admitting: *Deleted

## 2012-06-05 ENCOUNTER — Telehealth: Payer: Self-pay | Admitting: Medical Oncology

## 2012-06-05 ENCOUNTER — Ambulatory Visit (HOSPITAL_BASED_OUTPATIENT_CLINIC_OR_DEPARTMENT_OTHER): Payer: Medicare Other

## 2012-06-05 VITALS — BP 104/62 | HR 75 | Temp 97.8°F

## 2012-06-05 DIAGNOSIS — Z5112 Encounter for antineoplastic immunotherapy: Secondary | ICD-10-CM

## 2012-06-05 DIAGNOSIS — C9000 Multiple myeloma not having achieved remission: Secondary | ICD-10-CM

## 2012-06-05 LAB — CBC WITH DIFFERENTIAL/PLATELET
EOS%: 0.1 % (ref 0.0–7.0)
Eosinophils Absolute: 0 10*3/uL (ref 0.0–0.5)
LYMPH%: 8.7 % — ABNORMAL LOW (ref 14.0–49.0)
MCH: 30.5 pg (ref 27.2–33.4)
MCV: 91.7 fL (ref 79.3–98.0)
MONO%: 7.6 % (ref 0.0–14.0)
NEUT#: 10.3 10*3/uL — ABNORMAL HIGH (ref 1.5–6.5)
Platelets: 216 10*3/uL (ref 140–400)
RBC: 3.19 10*6/uL — ABNORMAL LOW (ref 4.20–5.82)

## 2012-06-05 LAB — COMPREHENSIVE METABOLIC PANEL (CC13)
AST: 24 U/L (ref 5–34)
Alkaline Phosphatase: 53 U/L (ref 40–150)
BUN: 17 mg/dL (ref 7.0–26.0)
Glucose: 154 mg/dl — ABNORMAL HIGH (ref 70–99)
Sodium: 129 mEq/L — ABNORMAL LOW (ref 136–145)
Total Bilirubin: 0.44 mg/dL (ref 0.20–1.20)

## 2012-06-05 MED ORDER — ONDANSETRON HCL 8 MG PO TABS
8.0000 mg | ORAL_TABLET | Freq: Once | ORAL | Status: AC
  Administered 2012-06-05: 8 mg via ORAL

## 2012-06-05 MED ORDER — BORTEZOMIB CHEMO SQ INJECTION 3.5 MG (2.5MG/ML)
1.3000 mg/m2 | Freq: Once | INTRAMUSCULAR | Status: AC
  Administered 2012-06-05: 2.75 mg via SUBCUTANEOUS
  Filled 2012-06-05: qty 2.75

## 2012-06-05 NOTE — Telephone Encounter (Signed)
Pt requests to change infusion appts to Monday -Thursday beginning dec 2 . [name] told pt to talk to [name] at his next appt. [name] will move appts.

## 2012-06-05 NOTE — Telephone Encounter (Signed)
Per patient request, he wants to move appts from Tuesday/Friday to Monday/Thrusday. Ok per Network engineer to move. I have moved aptps and gave him new schedule. JMW

## 2012-06-05 NOTE — Patient Instructions (Addendum)
[facility] Discharge Instructions for Patients Receiving Chemotherapy  Today you received the following chemotherapy agents: Velcade   To help prevent nausea and vomiting after your treatment, we encourage you to take your nausea medication.  Take it as often as prescribed.     If you develop nausea and vomiting that is not controlled by your nausea medication, call the clinic. If it is after clinic hours your family physician or the after hours number for the clinic or go to the Emergency Department.   BELOW ARE SYMPTOMS THAT SHOULD BE REPORTED IMMEDIATELY:  *FEVER GREATER THAN 100.5 F  *CHILLS WITH OR WITHOUT FEVER  NAUSEA AND VOMITING THAT IS NOT CONTROLLED WITH YOUR NAUSEA MEDICATION  *UNUSUAL SHORTNESS OF BREATH  *UNUSUAL BRUISING OR BLEEDING  TENDERNESS IN MOUTH AND THROAT WITH OR WITHOUT PRESENCE OF ULCERS  *URINARY PROBLEMS  *BOWEL PROBLEMS  UNUSUAL RASH Items with * indicate a potential emergency and should be followed up as soon as possible.  Feel free to call the clinic you have any questions or concerns. The clinic phone number is [phone].   I have been informed and understand all the instructions given to me. I know to contact the clinic, my physician, or go to the Emergency Department if any problems should occur. I do not have any questions at this time, but understand that I may call the clinic during office hours   should I have any questions or need assistance in obtaining follow up care.    __________________________________________  _____________  __________ Signature of Patient or Authorized Representative            Date                   Time    __________________________________________ Nurse's Signature    

## 2012-06-08 ENCOUNTER — Ambulatory Visit (HOSPITAL_BASED_OUTPATIENT_CLINIC_OR_DEPARTMENT_OTHER): Payer: Medicare Other

## 2012-06-08 VITALS — BP 130/63 | HR 78 | Temp 98.0°F

## 2012-06-08 DIAGNOSIS — C9000 Multiple myeloma not having achieved remission: Secondary | ICD-10-CM

## 2012-06-08 DIAGNOSIS — Z5112 Encounter for antineoplastic immunotherapy: Secondary | ICD-10-CM

## 2012-06-08 MED ORDER — BORTEZOMIB CHEMO SQ INJECTION 3.5 MG (2.5MG/ML)
1.3000 mg/m2 | Freq: Once | INTRAMUSCULAR | Status: AC
  Administered 2012-06-08: 2.75 mg via SUBCUTANEOUS
  Filled 2012-06-08: qty 2.75

## 2012-06-08 MED ORDER — ONDANSETRON HCL 8 MG PO TABS
8.0000 mg | ORAL_TABLET | Freq: Once | ORAL | Status: AC
  Administered 2012-06-08: 8 mg via ORAL

## 2012-06-08 NOTE — Patient Instructions (Signed)
[facility] Discharge Instructions for Patients Receiving Chemotherapy  Today you received the following chemotherapy agents: Velcade.  To help prevent nausea and vomiting after your treatment, we encourage you to take your nausea medication, Zofran. Take it as often as prescribed for the next 48 hours.   If you develop nausea and vomiting that is not controlled by your nausea medication, call the clinic. If it is after clinic hours your family physician or the after hours number for the clinic or go to the Emergency Department.   BELOW ARE SYMPTOMS THAT SHOULD BE REPORTED IMMEDIATELY:  *FEVER GREATER THAN 100.5 F  *CHILLS WITH OR WITHOUT FEVER  NAUSEA AND VOMITING THAT IS NOT CONTROLLED WITH YOUR NAUSEA MEDICATION  *UNUSUAL SHORTNESS OF BREATH  *UNUSUAL BRUISING OR BLEEDING  TENDERNESS IN MOUTH AND THROAT WITH OR WITHOUT PRESENCE OF ULCERS  *URINARY PROBLEMS  *BOWEL PROBLEMS  UNUSUAL RASH Items with * indicate a potential emergency and should be followed up as soon as possible.  Feel free to call the clinic you have any questions or concerns. The clinic phone number is [phone].   I have been informed and understand all the instructions given to me. I know to contact the clinic, my physician, or go to the Emergency Department if any problems should occur. I do not have any questions at this time, but understand that I may call the clinic during office hours   should I have any questions or need assistance in obtaining follow up care.

## 2012-06-11 ENCOUNTER — Other Ambulatory Visit (HOSPITAL_BASED_OUTPATIENT_CLINIC_OR_DEPARTMENT_OTHER): Payer: Medicare Other | Admitting: Lab

## 2012-06-11 ENCOUNTER — Ambulatory Visit (HOSPITAL_BASED_OUTPATIENT_CLINIC_OR_DEPARTMENT_OTHER): Payer: Medicare Other

## 2012-06-11 VITALS — BP 122/61 | HR 64 | Temp 97.0°F | Resp 20

## 2012-06-11 DIAGNOSIS — C9000 Multiple myeloma not having achieved remission: Secondary | ICD-10-CM

## 2012-06-11 DIAGNOSIS — Z5112 Encounter for antineoplastic immunotherapy: Secondary | ICD-10-CM

## 2012-06-11 LAB — CBC WITH DIFFERENTIAL/PLATELET
Basophils Absolute: 0 10*3/uL (ref 0.0–0.1)
Eosinophils Absolute: 0 10*3/uL (ref 0.0–0.5)
HCT: 30.2 % — ABNORMAL LOW (ref 38.4–49.9)
LYMPH%: 7.5 % — ABNORMAL LOW (ref 14.0–49.0)
MONO#: 0.1 10*3/uL (ref 0.1–0.9)
NEUT#: 7.9 10*3/uL — ABNORMAL HIGH (ref 1.5–6.5)
NEUT%: 90.4 % — ABNORMAL HIGH (ref 39.0–75.0)
Platelets: 200 10*3/uL (ref 140–400)
WBC: 8.7 10*3/uL (ref 4.0–10.3)

## 2012-06-11 LAB — LACTATE DEHYDROGENASE (CC13): LDH: 129 U/L (ref 125–245)

## 2012-06-11 LAB — COMPREHENSIVE METABOLIC PANEL (CC13)
BUN: 19 mg/dL (ref 7.0–26.0)
CO2: 22 mEq/L (ref 22–29)
Creatinine: 1.2 mg/dL (ref 0.7–1.3)
Glucose: 124 mg/dl — ABNORMAL HIGH (ref 70–99)
Total Bilirubin: 0.42 mg/dL (ref 0.20–1.20)
Total Protein: 12.1 g/dL — ABNORMAL HIGH (ref 6.4–8.3)

## 2012-06-11 MED ORDER — BORTEZOMIB CHEMO SQ INJECTION 3.5 MG (2.5MG/ML)
1.3000 mg/m2 | Freq: Once | INTRAMUSCULAR | Status: AC
  Administered 2012-06-11: 2.75 mg via SUBCUTANEOUS
  Filled 2012-06-11: qty 2.75

## 2012-06-11 MED ORDER — ZOLEDRONIC ACID 4 MG/5ML IV CONC
4.0000 mg | Freq: Once | INTRAVENOUS | Status: AC
  Administered 2012-06-11: 4 mg via INTRAVENOUS
  Filled 2012-06-11: qty 5

## 2012-06-11 MED ORDER — ONDANSETRON HCL 8 MG PO TABS
8.0000 mg | ORAL_TABLET | Freq: Once | ORAL | Status: AC
  Administered 2012-06-11: 8 mg via ORAL

## 2012-06-11 MED ORDER — SODIUM CHLORIDE 0.9 % IV SOLN
INTRAVENOUS | Status: DC
  Administered 2012-06-11: 16:00:00 via INTRAVENOUS

## 2012-06-11 NOTE — Patient Instructions (Signed)
Patient aware of next appointment; discharged home with no complaints. 

## 2012-06-12 ENCOUNTER — Ambulatory Visit: Payer: Medicare Other

## 2012-06-12 ENCOUNTER — Other Ambulatory Visit: Payer: Medicare Other | Admitting: Lab

## 2012-06-13 ENCOUNTER — Other Ambulatory Visit: Payer: Self-pay | Admitting: Medical Oncology

## 2012-06-13 DIAGNOSIS — C9000 Multiple myeloma not having achieved remission: Secondary | ICD-10-CM

## 2012-06-14 ENCOUNTER — Other Ambulatory Visit: Payer: Self-pay | Admitting: *Deleted

## 2012-06-14 ENCOUNTER — Ambulatory Visit (HOSPITAL_BASED_OUTPATIENT_CLINIC_OR_DEPARTMENT_OTHER): Payer: Medicare Other

## 2012-06-14 VITALS — BP 117/63 | HR 79 | Temp 98.5°F

## 2012-06-14 DIAGNOSIS — C9000 Multiple myeloma not having achieved remission: Secondary | ICD-10-CM

## 2012-06-14 DIAGNOSIS — Z5112 Encounter for antineoplastic immunotherapy: Secondary | ICD-10-CM

## 2012-06-14 MED ORDER — LENALIDOMIDE 25 MG PO CAPS: 25.0000 mg | ORAL_CAPSULE | Freq: Every day | ORAL | Status: DC

## 2012-06-14 MED ORDER — ONDANSETRON HCL 8 MG PO TABS
8.0000 mg | ORAL_TABLET | Freq: Once | ORAL | Status: AC
  Administered 2012-06-14: 8 mg via ORAL

## 2012-06-14 MED ORDER — BORTEZOMIB CHEMO SQ INJECTION 3.5 MG (2.5MG/ML)
1.3000 mg/m2 | Freq: Once | INTRAMUSCULAR | Status: AC
  Administered 2012-06-14: 2.75 mg via SUBCUTANEOUS
  Filled 2012-06-14: qty 2.75

## 2012-06-14 NOTE — Patient Instructions (Signed)
[facility] Discharge Instructions for Patients Receiving Chemotherapy  Today you received the following chemotherapy agents: velcade  To help prevent nausea and vomiting after your treatment, we encourage you to take your nausea medication.  Take it as often as prescribed.     If you develop nausea and vomiting that is not controlled by your nausea medication, call the clinic. If it is after clinic hours your family physician or the after hours number for the clinic or go to the Emergency Department.   BELOW ARE SYMPTOMS THAT SHOULD BE REPORTED IMMEDIATELY:  *FEVER GREATER THAN 100.5 F  *CHILLS WITH OR WITHOUT FEVER  NAUSEA AND VOMITING THAT IS NOT CONTROLLED WITH YOUR NAUSEA MEDICATION  *UNUSUAL SHORTNESS OF BREATH  *UNUSUAL BRUISING OR BLEEDING  TENDERNESS IN MOUTH AND THROAT WITH OR WITHOUT PRESENCE OF ULCERS  *URINARY PROBLEMS  *BOWEL PROBLEMS  UNUSUAL RASH Items with * indicate a potential emergency and should be followed up as soon as possible.  Feel free to call the clinic you have any questions or concerns. The clinic phone number is [phone].   I have been informed and understand all the instructions given to me. I know to contact the clinic, my physician, or go to the Emergency Department if any problems should occur. I do not have any questions at this time, but understand that I may call the clinic during office hours   should I have any questions or need assistance in obtaining follow up care.    __________________________________________  _____________  __________ Signature of Patient or Authorized Representative            Date                   Time    __________________________________________ Nurse's Signature    

## 2012-06-15 ENCOUNTER — Ambulatory Visit: Payer: Medicare Other

## 2012-06-19 ENCOUNTER — Other Ambulatory Visit: Payer: Medicare Other | Admitting: Lab

## 2012-06-19 ENCOUNTER — Ambulatory Visit: Payer: Medicare Other

## 2012-06-22 ENCOUNTER — Ambulatory Visit: Payer: Medicare Other

## 2012-06-25 ENCOUNTER — Telehealth: Payer: Self-pay | Admitting: Internal Medicine

## 2012-06-25 ENCOUNTER — Ambulatory Visit (HOSPITAL_BASED_OUTPATIENT_CLINIC_OR_DEPARTMENT_OTHER): Payer: Medicare Other

## 2012-06-25 ENCOUNTER — Ambulatory Visit (HOSPITAL_BASED_OUTPATIENT_CLINIC_OR_DEPARTMENT_OTHER): Payer: Medicare Other | Admitting: Physician Assistant

## 2012-06-25 ENCOUNTER — Encounter: Payer: Self-pay | Admitting: Physician Assistant

## 2012-06-25 ENCOUNTER — Other Ambulatory Visit (HOSPITAL_BASED_OUTPATIENT_CLINIC_OR_DEPARTMENT_OTHER): Payer: Medicare Other | Admitting: Lab

## 2012-06-25 VITALS — BP 112/67 | HR 70 | Temp 97.1°F | Resp 18 | Ht 70.0 in | Wt 194.4 lb

## 2012-06-25 DIAGNOSIS — R05 Cough: Secondary | ICD-10-CM

## 2012-06-25 DIAGNOSIS — Z5112 Encounter for antineoplastic immunotherapy: Secondary | ICD-10-CM

## 2012-06-25 DIAGNOSIS — C9000 Multiple myeloma not having achieved remission: Secondary | ICD-10-CM

## 2012-06-25 LAB — LACTATE DEHYDROGENASE (CC13): LDH: 140 U/L (ref 125–245)

## 2012-06-25 LAB — COMPREHENSIVE METABOLIC PANEL (CC13)
ALT: 17 U/L (ref 0–55)
AST: 18 U/L (ref 5–34)
Albumin: 2.2 g/dL — ABNORMAL LOW (ref 3.5–5.0)
Alkaline Phosphatase: 58 U/L (ref 40–150)
BUN: 15 mg/dL (ref 7.0–26.0)
CO2: 22 mEq/L (ref 22–29)
Calcium: 8.2 mg/dL — ABNORMAL LOW (ref 8.4–10.4)
Chloride: 107 mEq/L (ref 98–107)
Creatinine: 1.1 mg/dL (ref 0.7–1.3)
Glucose: 110 mg/dl — ABNORMAL HIGH (ref 70–99)
Potassium: 4.4 mEq/L (ref 3.5–5.1)
Sodium: 132 mEq/L — ABNORMAL LOW (ref 136–145)
Total Bilirubin: 0.25 mg/dL (ref 0.20–1.20)
Total Protein: 11.8 g/dL — ABNORMAL HIGH (ref 6.4–8.3)

## 2012-06-25 LAB — CBC WITH DIFFERENTIAL/PLATELET
BASO%: 0.8 % (ref 0.0–2.0)
HCT: 29 % — ABNORMAL LOW (ref 38.4–49.9)
HGB: 9.9 g/dL — ABNORMAL LOW (ref 13.0–17.1)
MCHC: 34 g/dL (ref 32.0–36.0)
MONO#: 0.1 10*3/uL (ref 0.1–0.9)
NEUT%: 86.3 % — ABNORMAL HIGH (ref 39.0–75.0)
WBC: 5.1 10*3/uL (ref 4.0–10.3)
lymph#: 0.5 10*3/uL — ABNORMAL LOW (ref 0.9–3.3)

## 2012-06-25 MED ORDER — ONDANSETRON HCL 8 MG PO TABS
8.0000 mg | ORAL_TABLET | Freq: Once | ORAL | Status: AC
  Administered 2012-06-25: 8 mg via ORAL

## 2012-06-25 MED ORDER — AZITHROMYCIN 250 MG PO TABS: ORAL_TABLET | ORAL | Status: DC

## 2012-06-25 MED ORDER — BORTEZOMIB CHEMO SQ INJECTION 3.5 MG (2.5MG/ML)
1.3000 mg/m2 | Freq: Once | INTRAMUSCULAR | Status: AC
  Administered 2012-06-25: 2.75 mg via SUBCUTANEOUS
  Filled 2012-06-25: qty 2.75

## 2012-06-25 MED ORDER — TEMAZEPAM 30 MG PO CAPS: 30.0000 mg | ORAL_CAPSULE | Freq: Every day | ORAL | Status: DC

## 2012-06-25 NOTE — Telephone Encounter (Signed)
Pt will get calendar appt for January 2014 with [facility] @ chemo room  today

## 2012-06-25 NOTE — Patient Instructions (Signed)
[facility] Discharge Instructions for Patients Receiving Chemotherapy  Today you received the following chemotherapy agents: Velcade   To help prevent nausea and vomiting after your treatment, we encourage you to take your nausea medication.  Take it as often as prescribed.     If you develop nausea and vomiting that is not controlled by your nausea medication, call the clinic. If it is after clinic hours your family physician or the after hours number for the clinic or go to the Emergency Department.   BELOW ARE SYMPTOMS THAT SHOULD BE REPORTED IMMEDIATELY:  *FEVER GREATER THAN 100.5 F  *CHILLS WITH OR WITHOUT FEVER  NAUSEA AND VOMITING THAT IS NOT CONTROLLED WITH YOUR NAUSEA MEDICATION  *UNUSUAL SHORTNESS OF BREATH  *UNUSUAL BRUISING OR BLEEDING  TENDERNESS IN MOUTH AND THROAT WITH OR WITHOUT PRESENCE OF ULCERS  *URINARY PROBLEMS  *BOWEL PROBLEMS  UNUSUAL RASH Items with * indicate a potential emergency and should be followed up as soon as possible.  Feel free to call the clinic you have any questions or concerns. The clinic phone number is [phone].   I have been informed and understand all the instructions given to me. I know to contact the clinic, my physician, or go to the Emergency Department if any problems should occur. I do not have any questions at this time, but understand that I may call the clinic during office hours   should I have any questions or need assistance in obtaining follow up care.    __________________________________________  _____________  __________ Signature of Patient or Authorized Representative            Date                   Time    __________________________________________ Nurse's Signature    

## 2012-06-25 NOTE — Patient Instructions (Addendum)
Follow up in 3 weeks prior to the start of your next cycle of chemotherapy Your Zometa will be rescheduled to 07/16/12 at your request so that you can enjoy your holidays

## 2012-06-26 ENCOUNTER — Ambulatory Visit: Payer: Medicare Other | Admitting: Physician Assistant

## 2012-06-26 ENCOUNTER — Ambulatory Visit: Payer: Medicare Other

## 2012-06-26 ENCOUNTER — Other Ambulatory Visit: Payer: Medicare Other | Admitting: Lab

## 2012-06-26 NOTE — Progress Notes (Signed)
[facility] [phone]   [phone]  OFFICE PROGRESS NOTE  [name],Hector A, MD [address] [place] [address]  DIAGNOSIS: Stage IIA multiple myeloma diagnosed in July of 2013   PRIOR THERAPY: systemic chemotherapy with subcutaneous Velcade 1.3 mg/M2 on days 1, 4, 8 and 11 every 3 weeks in addition to Revlimid 25 mg by mouth daily for 14 days every 3 weeks and dexamethasone 40 mg on [name] weekly basis. Status post 4 planned cycles   CURRENT THERAPY:  1.    Systemic chemotherapy with subcutaneous Velcade 1.3 mg/M2 on days 1, 4, 8 and 11 every 3 weeks in addition to Revlimid 25 mg by mouth daily for 14 days every 3 weeks and dexamethasone 40 mg on [name] weekly basis. 4 more cycles are planned and he is status post one of the second set of 4 cycles 2. Prophylactic dose Coumadin at 2 mg by mouth daily 3. Zometa 4 mg IV given [name] monthly basis for bone disease  INTERVAL HISTORY: [name] 71 y.o. male returns to the clinic today for followup visit. He is tolerating his chemotherapy with subcutaneous Velcade and Revlimid and dexamethasone without difficulty. He does request [name] refill for his Restoril. He complains of [name] productive cough with you lowest secretions. He denied any Frank fever. He is wondering if his next Zometa can be changed from December 30 to either December 26 or January 6 to allow [place] He voiced no other specific complaints today. He denied having any significant weight loss or night sweats. He denied having any bleeding issues. He denied having any significant chest pain, shortness breath, cough or hemoptysis.   MEDICAL HISTORY: Past Medical History  Diagnosis Date  . GERD (gastroesophageal reflux disease)   . Adenomatous polyp of colon 1996  . Arthritis     Osteoarthritis right knee, spine, wrist  . Benign prostatic hypertrophy     (Dr. Vernie Ammons)  . History of chronic  prostatitis   . History of melanoma     Back (Dr. Karlyn Agee); early melanoma R arm 03/2011  . Diverticulosis   . Arthritis of hand, right     Right thumb (Dr. Teressa Senter)  . Degenerative disc disease   . Hemorrhoids   . Erosive esophagitis   . Back ache     r/o Multiple Myeloma    ALLERGIES:  is allergic to penicillins.  MEDICATIONS:  Current Outpatient Prescriptions  Medication Sig Dispense Refill  . ascorbic acid (VITAMIN C) 500 MG tablet Take 500 mg by mouth daily.      Marland Kitchen aspirin EC 81 MG tablet Take 81 mg by mouth every other day.      Marland Kitchen azithromycin (ZITHROMAX) 250 MG tablet Take 2 tablets by mouth on day one, then take 1 tablet by mouth daily until completed  6 each  0  . B Complex-C (B-COMPLEX WITH VITAMIN C) tablet Take 1 tablet by mouth daily.      . bortezomib IV (VELCADE) 3.5 MG injection Inject 1.3 mg/m2 into the vein 2 (two) times [name] week.      . CELEBREX 200 MG capsule Take 200 mg by mouth daily.       . cholecalciferol (VITAMIN D) 1000 UNITS tablet Take 1,000 Units by mouth daily.      Marland Kitchen dexamethasone (DECADRON) 4 MG tablet Take 10 tablets (40 mg total) by mouth once [name] week. Takes  on Monday am.  120 tablet  0  . diphenhydrAMINE (BENADRYL) 25 MG tablet Take 50 mg by mouth every 6 (six) hours as needed. Itching      . fish oil-omega-3 fatty acids 1000 MG capsule Take 1 g by mouth daily.       Marland Kitchen glucosamine-chondroitin 500-400 MG tablet Take 1 tablet by mouth 3 (three) times daily.      Marland Kitchen HYDROcodone-acetaminophen (NORCO) 7.5-325 MG per tablet Take 1 tablet by mouth every 6 (six) hours as needed. Pain      . Ibuprofen (IBU-200 PO) Take 3 tablets by mouth daily as needed.      Marland Kitchen lenalidomide (REVLIMID) 25 MG capsule Take 1 capsule (25 mg total) by mouth daily. X 14 days every 3 weeks  14 capsule  0  . methocarbamol (ROBAXIN) 500 MG tablet Take 500 mg by mouth 3 (three) times daily as needed. Muscle spasm      . Multiple Vitamin (MULTIVITAMIN) capsule Take 1 capsule by mouth  daily.      Marland Kitchen oxyCODONE (OXY IR/ROXICODONE) 5 MG immediate release tablet Take 5 mg by mouth every 8 (eight) hours as needed. Pain      . pantoprazole (PROTONIX) 40 MG tablet Take 40 mg by mouth daily.      . potassium chloride SA (K-DUR,KLOR-CON) 20 MEQ tablet Take 20 mEq by mouth daily.      . Probiotic Product (ALIGN) 4 MG CAPS Take 1 capsule by mouth daily.      . prochlorperazine (COMPAZINE) 10 MG tablet Take 10 mg by mouth every 6 (six) hours as needed. Nausea      . temazepam (RESTORIL) 30 MG capsule Take 1 capsule (30 mg total) by mouth at bedtime.  30 capsule  0  . warfarin (COUMADIN) 2 MG tablet Take 1 tablet (2 mg total) by mouth daily.  30 tablet  2  . zolendronic acid (ZOMETA) 4 MG/5ML injection Inject 4 mg into the vein every 28 (twenty-eight) days.        SURGICAL HISTORY:  Past Surgical History  Procedure Date  . Total knee replaced 2000    Left knee  . Appendectomy   . Colonoscopy 2009  . Esophagogastroduodenoscopy 2009    mild inflammation at esophagogastric junction  . Excision of melanoma     back ( x3)  . Great toe surgery     bilateral  . Inguinal hernia repair     left, x 2  . Total knee arthroplasty 06/08/2011    Procedure: TOTAL KNEE ARTHROPLASTY;  Surgeon: Loreta Ave, MD;  Location: [facility];  Service: Orthopedics;  Laterality: Right;  osteonics    REVIEW OF SYSTEMS:  [name] comprehensive review of systems was negative except for: Respiratory: positive for cough and sputum   PHYSICAL EXAMINATION: General appearance: alert, cooperative and no distress Head: Normocephalic, without obvious abnormality, atraumatic Neck: no adenopathy Lymph nodes: Cervical, supraclavicular, and axillary nodes normal. Resp: clear to auscultation bilaterally Cardio: regular rate and rhythm, S1, S2 normal, no murmur, click, rub or gallop GI: soft, non-tender; bowel sounds normal; no masses,  no organomegaly Extremities: extremities normal, atraumatic, no cyanosis or  edema Neurologic: Alert and oriented X 3, normal strength and tone. Normal symmetric reflexes. Normal coordination and gait  ECOG PERFORMANCE STATUS: 1 - Symptomatic but completely ambulatory  Blood pressure 112/67, pulse 70, temperature 97.1 F (36.2 C), temperature source Oral, resp. rate 18, height 5\' 10"  (1.778 m), weight 194 lb 6.4 oz (88.179 kg).  LABORATORY DATA: Lab Results  Component Value Date   WBC 5.1 06/25/2012   HGB 9.9* 06/25/2012   HCT 29.0* 06/25/2012   MCV 91.3 06/25/2012   PLT 381 06/25/2012      Chemistry      Component Value Date/Time   NA 132* 06/25/2012 1201   NA 124* 02/20/2012 1317   K 4.4 06/25/2012 1201   K 3.9 02/20/2012 1317   CL 107 06/25/2012 1201   CL 101 02/20/2012 1317   CO2 22 06/25/2012 1201   CO2 24 02/20/2012 1317   BUN 15.0 06/25/2012 1201   BUN 9 02/20/2012 1317   CREATININE 1.1 06/25/2012 1201   CREATININE 0.92 02/20/2012 1317   CREATININE 0.98 01/18/2012 0934      Component Value Date/Time   CALCIUM 8.2* 06/25/2012 1201   CALCIUM 7.7* 02/20/2012 1317   ALKPHOS 58 06/25/2012 1201   ALKPHOS 48 02/20/2012 1317   AST 18 06/25/2012 1201   AST 38* 02/20/2012 1317   ALT 17 06/25/2012 1201   ALT 33 02/20/2012 1317   BILITOT 0.25 06/25/2012 1201   BILITOT 0.2* 02/20/2012 1317     BONE MARROW REPORT FINAL DIAGNOSIS Bone Marrow, Aspirate,Biopsy, and Clot, Left superior posterior iliac - HYPERCELLULAR BONE MARROW FOR AGE WITH RESIDUAL PLASMA CELL NEOPLASM - SEE COMMENT. PERIPHERAL BLOOD: - NORMOCYTIC-NORMOCHROMIC ANEMIA. Diagnosis Note The plasma cells are increased in number, representing 30% in the aspirate, associated with interstitial infiltrates and numerous variably sized aggregates in the clot sections. Immunohistochemical stains show that the plasma cells are lambda light chain restricted consistent with residual plasma cell neoplasm. (BNS:kh 05-22-12) Guerry Bruin MD Pathologist, Electronic Signature (Case signed  05/22/2012)  RADIOGRAPHIC STUDIES: No results found.  ASSESSMENT/PLAN: This is [name] very pleasant 71 years old white male with history of multiple myeloma status post 4 cycles of systemic therapy with Revlimid, Velcade and Decadron with partial response on the recent multiple myeloma panel as well as [name] bone marrow biopsy and aspirate. The patient is currently undergoing another 4 cycles of systemic chemotherapy with Revlimid Velcade and Decadron. He is status post one cycle of his second set of 4 cycles. Patient was discussed with Dr. Arbutus Ped. He'll proceed with cycle #2 as scheduled today. To cover his suspected bronchitis you'll be placed on [name] Z-Pak and this prescription was sent to his pharmacy of record via E. scribed. We will reschedule his Zometa to coincide with his followup visit and cycle #3 on 07/16/2012 with repeat labs at that visit. Patient voiced understanding and appreciation for flexibility.  Marland KitchenLaural Benes, Kadience Macchi E, PA-C   All questions were answered. The patient knows to call the clinic with any problems, questions or concerns. We can certainly see the patient much sooner if necessary.  I spent 20 minutes counseling the patient face to face. The total time spent in the appointment was 30 minutes.

## 2012-06-28 ENCOUNTER — Ambulatory Visit (HOSPITAL_BASED_OUTPATIENT_CLINIC_OR_DEPARTMENT_OTHER): Payer: Medicare Other

## 2012-06-28 ENCOUNTER — Other Ambulatory Visit: Payer: Self-pay | Admitting: *Deleted

## 2012-06-28 VITALS — BP 118/65 | HR 70 | Temp 97.7°F | Resp 18

## 2012-06-28 DIAGNOSIS — Z5112 Encounter for antineoplastic immunotherapy: Secondary | ICD-10-CM

## 2012-06-28 DIAGNOSIS — C9000 Multiple myeloma not having achieved remission: Secondary | ICD-10-CM

## 2012-06-28 MED ORDER — ONDANSETRON HCL 8 MG PO TABS
8.0000 mg | ORAL_TABLET | Freq: Once | ORAL | Status: AC
  Administered 2012-06-28: 8 mg via ORAL

## 2012-06-28 MED ORDER — BORTEZOMIB CHEMO SQ INJECTION 3.5 MG (2.5MG/ML)
1.3000 mg/m2 | Freq: Once | INTRAMUSCULAR | Status: AC
  Administered 2012-06-28: 2.75 mg via SUBCUTANEOUS
  Filled 2012-06-28: qty 2.75

## 2012-06-28 NOTE — Telephone Encounter (Signed)
error 

## 2012-06-28 NOTE — Patient Instructions (Addendum)
[facility] Discharge Instructions for Patients Receiving Chemotherapy  Today you received the following chemotherapy agents   To help prevent nausea and vomiting after your treatment, we encourage you to take your nausea medication  and take it as often as prescribed   If you develop nausea and vomiting that is not controlled by your nausea medication, call the clinic. If it is after clinic hours your family physician or the after hours number for the clinic or go to the Emergency Department.   BELOW ARE SYMPTOMS THAT SHOULD BE REPORTED IMMEDIATELY:  *FEVER GREATER THAN 100.5 F  *CHILLS WITH OR WITHOUT FEVER  NAUSEA AND VOMITING THAT IS NOT CONTROLLED WITH YOUR NAUSEA MEDICATION  *UNUSUAL SHORTNESS OF BREATH  *UNUSUAL BRUISING OR BLEEDING  TENDERNESS IN MOUTH AND THROAT WITH OR WITHOUT PRESENCE OF ULCERS  *URINARY PROBLEMS  *BOWEL PROBLEMS  UNUSUAL RASH Items with * indicate a potential emergency and should be followed up as soon as possible.  One of the nurses will contact you 24 hours after your treatment. Please let the nurse know about any problems that you may have experienced. Feel free to call the clinic you have any questions or concerns. The clinic phone number is [phone].   I have been informed and understand all the instructions given to me. I know to contact the clinic, my physician, or go to the Emergency Department if any problems should occur. I do not have any questions at this time, but understand that I may call the clinic during office hours   should I have any questions or need assistance in obtaining follow up care.    __________________________________________  _____________  __________ Signature of Patient or Authorized Representative            Date                   Time    __________________________________________ Nurse's Signature    

## 2012-06-29 ENCOUNTER — Ambulatory Visit: Payer: Medicare Other

## 2012-07-02 ENCOUNTER — Ambulatory Visit (HOSPITAL_BASED_OUTPATIENT_CLINIC_OR_DEPARTMENT_OTHER): Payer: Medicare Other

## 2012-07-02 ENCOUNTER — Other Ambulatory Visit (HOSPITAL_BASED_OUTPATIENT_CLINIC_OR_DEPARTMENT_OTHER): Payer: Medicare Other | Admitting: Lab

## 2012-07-02 VITALS — BP 124/72 | HR 68 | Temp 96.8°F | Resp 18

## 2012-07-02 DIAGNOSIS — Z5112 Encounter for antineoplastic immunotherapy: Secondary | ICD-10-CM

## 2012-07-02 DIAGNOSIS — C9000 Multiple myeloma not having achieved remission: Secondary | ICD-10-CM

## 2012-07-02 LAB — COMPREHENSIVE METABOLIC PANEL (CC13)
ALT: 14 U/L (ref 0–55)
Albumin: 2 g/dL — ABNORMAL LOW (ref 3.5–5.0)
Alkaline Phosphatase: 51 U/L (ref 40–150)
CO2: 28 mEq/L (ref 22–29)
Glucose: 100 mg/dl — ABNORMAL HIGH (ref 70–99)
Potassium: 4 mEq/L (ref 3.5–5.1)
Sodium: 131 mEq/L — ABNORMAL LOW (ref 136–145)
Total Bilirubin: 0.28 mg/dL (ref 0.20–1.20)
Total Protein: 11.1 g/dL — ABNORMAL HIGH (ref 6.4–8.3)

## 2012-07-02 LAB — CBC WITH DIFFERENTIAL/PLATELET
BASO%: 1.5 % (ref 0.0–2.0)
Eosinophils Absolute: 0.2 10*3/uL (ref 0.0–0.5)
HCT: 28.6 % — ABNORMAL LOW (ref 38.4–49.9)
LYMPH%: 16.3 % (ref 14.0–49.0)
MCHC: 33.9 g/dL (ref 32.0–36.0)
MCV: 90.7 fL (ref 79.3–98.0)
MONO#: 0.4 10*3/uL (ref 0.1–0.9)
MONO%: 6.4 % (ref 0.0–14.0)
NEUT%: 73.1 % (ref 39.0–75.0)
Platelets: 271 10*3/uL (ref 140–400)
RBC: 3.15 10*6/uL — ABNORMAL LOW (ref 4.20–5.82)

## 2012-07-02 LAB — LACTATE DEHYDROGENASE (CC13): LDH: 121 U/L — ABNORMAL LOW (ref 125–245)

## 2012-07-02 MED ORDER — ONDANSETRON HCL 8 MG PO TABS
8.0000 mg | ORAL_TABLET | Freq: Once | ORAL | Status: AC
  Administered 2012-07-02: 8 mg via ORAL

## 2012-07-02 MED ORDER — BORTEZOMIB CHEMO SQ INJECTION 3.5 MG (2.5MG/ML)
1.3000 mg/m2 | Freq: Once | INTRAMUSCULAR | Status: AC
  Administered 2012-07-02: 2.75 mg via SUBCUTANEOUS
  Filled 2012-07-02: qty 2.75

## 2012-07-02 NOTE — Patient Instructions (Addendum)
[facility] Discharge Instructions for Patients Receiving Chemotherapy  Today you received the following chemotherapy agent Velcade.  To help prevent nausea and vomiting after your treatment, we encourage you to take your nausea medication. Begin taking your nausea medication as often as prescribed for by Dr. [name].    If you develop nausea and vomiting that is not controlled by your nausea medication, call the clinic. If it is after clinic hours your family physician or the after hours number for the clinic or go to the Emergency Department.   BELOW ARE SYMPTOMS THAT SHOULD BE REPORTED IMMEDIATELY:  *FEVER GREATER THAN 100.5 F  *CHILLS WITH OR WITHOUT FEVER  NAUSEA AND VOMITING THAT IS NOT CONTROLLED WITH YOUR NAUSEA MEDICATION  *UNUSUAL SHORTNESS OF BREATH  *UNUSUAL BRUISING OR BLEEDING  TENDERNESS IN MOUTH AND THROAT WITH OR WITHOUT PRESENCE OF ULCERS  *URINARY PROBLEMS  *BOWEL PROBLEMS  UNUSUAL RASH Items with * indicate a potential emergency and should be followed up as soon as possible.  One of the nurses will contact you 24 hours after your treatment. Please let the nurse know about any problems that you may have experienced. Feel free to call the clinic you have any questions or concerns. The clinic phone number is [phone].   I have been informed and understand all the instructions given to me. I know to contact the clinic, my physician, or go to the Emergency Department if any problems should occur. I do not have any questions at this time, but understand that I may call the clinic during office hours   should I have any questions or need assistance in obtaining follow up care.    __________________________________________  _____________  __________ Signature of Patient or Authorized Representative            Date                   Time    __________________________________________ Nurse's Signature    

## 2012-07-03 ENCOUNTER — Ambulatory Visit: Payer: Medicare Other

## 2012-07-03 ENCOUNTER — Other Ambulatory Visit: Payer: Medicare Other | Admitting: Lab

## 2012-07-05 ENCOUNTER — Ambulatory Visit (HOSPITAL_BASED_OUTPATIENT_CLINIC_OR_DEPARTMENT_OTHER): Payer: Medicare Other

## 2012-07-05 VITALS — BP 119/66 | HR 68 | Temp 97.9°F | Resp 20

## 2012-07-05 DIAGNOSIS — Z5112 Encounter for antineoplastic immunotherapy: Secondary | ICD-10-CM

## 2012-07-05 DIAGNOSIS — C9000 Multiple myeloma not having achieved remission: Secondary | ICD-10-CM

## 2012-07-05 MED ORDER — BORTEZOMIB CHEMO SQ INJECTION 3.5 MG (2.5MG/ML)
1.3000 mg/m2 | Freq: Once | INTRAMUSCULAR | Status: AC
  Administered 2012-07-05: 2.75 mg via SUBCUTANEOUS
  Filled 2012-07-05: qty 2.75

## 2012-07-05 MED ORDER — ONDANSETRON HCL 8 MG PO TABS
8.0000 mg | ORAL_TABLET | Freq: Once | ORAL | Status: AC
  Administered 2012-07-05: 8 mg via ORAL

## 2012-07-05 NOTE — Patient Instructions (Addendum)
[facility] Discharge Instructions for Patients Receiving Chemotherapy  Today you received the following chemotherapy agents: Velcade. To help prevent nausea and vomiting after your treatment, we encourage you to take your nausea medication.  If you develop nausea and vomiting that is not controlled by your nausea medication, call the clinic.   BELOW ARE SYMPTOMS THAT SHOULD BE REPORTED IMMEDIATELY:  *FEVER GREATER THAN 100.5 F  *CHILLS WITH OR WITHOUT FEVER  NAUSEA AND VOMITING THAT IS NOT CONTROLLED WITH YOUR NAUSEA MEDICATION  *UNUSUAL SHORTNESS OF BREATH  *UNUSUAL BRUISING OR BLEEDING  TENDERNESS IN MOUTH AND THROAT WITH OR WITHOUT PRESENCE OF ULCERS  *URINARY PROBLEMS  *BOWEL PROBLEMS  UNUSUAL RASH Items with * indicate a potential emergency and should be followed up as soon as possible.  Feel free to call the clinic you have any questions or concerns. The clinic phone number is [phone].    

## 2012-07-06 ENCOUNTER — Encounter: Payer: Self-pay | Admitting: Internal Medicine

## 2012-07-06 ENCOUNTER — Ambulatory Visit: Payer: Medicare Other

## 2012-07-09 ENCOUNTER — Telehealth: Payer: Self-pay | Admitting: *Deleted

## 2012-07-09 ENCOUNTER — Other Ambulatory Visit (HOSPITAL_COMMUNITY): Payer: Self-pay | Admitting: *Deleted

## 2012-07-09 NOTE — Telephone Encounter (Signed)
Per staff message I have moved 1/16 to an earlier time.  JMW

## 2012-07-10 ENCOUNTER — Other Ambulatory Visit: Payer: Self-pay | Admitting: *Deleted

## 2012-07-10 ENCOUNTER — Telehealth: Payer: Self-pay | Admitting: Internal Medicine

## 2012-07-10 DIAGNOSIS — C9000 Multiple myeloma not having achieved remission: Secondary | ICD-10-CM

## 2012-07-10 MED ORDER — LENALIDOMIDE 25 MG PO CAPS: 25.0000 mg | ORAL_CAPSULE | Freq: Every day | ORAL | Status: DC

## 2012-07-10 NOTE — Telephone Encounter (Signed)
Talked to patient he is aware of all appt on January 2014

## 2012-07-16 ENCOUNTER — Other Ambulatory Visit (HOSPITAL_BASED_OUTPATIENT_CLINIC_OR_DEPARTMENT_OTHER): Payer: Medicare Other | Admitting: Lab

## 2012-07-16 ENCOUNTER — Encounter: Payer: Self-pay | Admitting: Physician Assistant

## 2012-07-16 ENCOUNTER — Ambulatory Visit (HOSPITAL_BASED_OUTPATIENT_CLINIC_OR_DEPARTMENT_OTHER): Payer: Medicare Other

## 2012-07-16 ENCOUNTER — Ambulatory Visit (HOSPITAL_BASED_OUTPATIENT_CLINIC_OR_DEPARTMENT_OTHER): Payer: Medicare Other | Admitting: Physician Assistant

## 2012-07-16 VITALS — BP 133/73 | HR 75 | Temp 97.7°F | Resp 20 | Ht 70.0 in | Wt 195.7 lb

## 2012-07-16 DIAGNOSIS — C9000 Multiple myeloma not having achieved remission: Secondary | ICD-10-CM

## 2012-07-16 DIAGNOSIS — Z5112 Encounter for antineoplastic immunotherapy: Secondary | ICD-10-CM

## 2012-07-16 LAB — LACTATE DEHYDROGENASE (CC13): LDH: 142 U/L (ref 125–245)

## 2012-07-16 LAB — COMPREHENSIVE METABOLIC PANEL (CC13)
ALT: 15 U/L (ref 0–55)
Alkaline Phosphatase: 57 U/L (ref 40–150)
CO2: 23 mEq/L (ref 22–29)
Creatinine: 1 mg/dL (ref 0.7–1.3)
Sodium: 134 mEq/L — ABNORMAL LOW (ref 136–145)
Total Bilirubin: 0.23 mg/dL (ref 0.20–1.20)

## 2012-07-16 LAB — CBC WITH DIFFERENTIAL/PLATELET
BASO%: 1 % (ref 0.0–2.0)
HCT: 29.9 % — ABNORMAL LOW (ref 38.4–49.9)
LYMPH%: 9.9 % — ABNORMAL LOW (ref 14.0–49.0)
MCH: 30.9 pg (ref 27.2–33.4)
MCHC: 34.2 g/dL (ref 32.0–36.0)
MCV: 90.4 fL (ref 79.3–98.0)
MONO#: 0.1 10*3/uL (ref 0.1–0.9)
MONO%: 1.3 % (ref 0.0–14.0)
NEUT%: 87.7 % — ABNORMAL HIGH (ref 39.0–75.0)
Platelets: 344 10*3/uL (ref 140–400)
RBC: 3.31 10*6/uL — ABNORMAL LOW (ref 4.20–5.82)

## 2012-07-16 MED ORDER — BORTEZOMIB CHEMO SQ INJECTION 3.5 MG (2.5MG/ML)
1.3000 mg/m2 | Freq: Once | INTRAMUSCULAR | Status: AC
  Administered 2012-07-16: 2.75 mg via SUBCUTANEOUS
  Filled 2012-07-16: qty 2.75

## 2012-07-16 MED ORDER — ONDANSETRON HCL 8 MG PO TABS: 8.0000 mg | ORAL_TABLET | Freq: Once | ORAL | Status: DC

## 2012-07-16 MED ORDER — ZOLEDRONIC ACID 4 MG/100ML IV SOLN
4.0000 mg | Freq: Once | INTRAVENOUS | Status: AC
  Administered 2012-07-16: 4 mg via INTRAVENOUS
  Filled 2012-07-16: qty 100

## 2012-07-16 MED ORDER — ZOLEDRONIC ACID 4 MG/5ML IV CONC
4.0000 mg | Freq: Once | INTRAVENOUS | Status: DC
  Filled 2012-07-16: qty 5

## 2012-07-16 NOTE — Patient Instructions (Addendum)
Continue Velcade as scheduled Follow up in one month

## 2012-07-17 ENCOUNTER — Telehealth: Payer: Self-pay | Admitting: Internal Medicine

## 2012-07-17 ENCOUNTER — Other Ambulatory Visit: Payer: Self-pay | Admitting: Certified Registered Nurse Anesthetist

## 2012-07-17 NOTE — Progress Notes (Signed)
[facility] [phone]   [phone]  OFFICE PROGRESS NOTE  [name],[name], MD [address] [place] [address]  DIAGNOSIS: Stage IIA multiple myeloma diagnosed in July of 2013   PRIOR THERAPY: systemic chemotherapy with subcutaneous Velcade 1.3 mg/M2 on days 1, 4, 8 and 11 every 3 weeks in addition to Revlimid 25 mg by mouth daily for 14 days every 3 weeks and dexamethasone 40 mg on a weekly basis. Status post 4 planned cycles   CURRENT THERAPY:  1.    Systemic chemotherapy with subcutaneous Velcade 1.3 mg/M2 on days 1, 4, 8 and 11 every 3 weeks in addition to Revlimid 25 mg by mouth daily for 14 days every 3 weeks and dexamethasone 40 mg on a weekly basis. 4 more cycles are planned and he is status post 2 cycles of the second set of 4 cycles 2. Prophylactic dose Coumadin at 2 mg by mouth daily 3. Zometa 4 mg IV given a monthly basis for bone disease  INTERVAL HISTORY: [name] 72 y.o. male returns to the clinic today for followup visit. He is tolerating his chemotherapy with subcutaneous Velcade and Revlimid and dexamethasone without difficulty. He presents to proceed with his scheduled Zometa infusion. He reports that he enjoyed his holidays and time spent at [facility]. He states that he is taking his wife on a cruise from 07/26/12-08/05/12. This will cause him to start his next cycle of Revlimid 1 day later.He states that his energy level has improved and he is able to walk for 1 hour each day. He was bitten by something on his chest [place]. He denied fever related to this bite or any drainage. He voiced no other specific complaints today. He denied having any significant weight loss or night sweats. He denied having any bleeding issues. He denied having any significant chest pain, shortness breath, cough or hemoptysis.   MEDICAL HISTORY: Past Medical History  Diagnosis Date  . GERD (gastroesophageal  reflux disease)   . Adenomatous polyp of colon 1996  . Arthritis     Osteoarthritis right knee, spine, wrist  . Benign prostatic hypertrophy     (Dr. Vernie Ammons)  . History of chronic prostatitis   . History of melanoma     Back (Dr. Karlyn Agee); early melanoma R arm 03/2011  . Diverticulosis   . Arthritis of hand, right     Right thumb (Dr. Teressa Senter)  . Degenerative disc disease   . Hemorrhoids   . Erosive esophagitis   . Back ache     r/o Multiple Myeloma    ALLERGIES:  is allergic to penicillins.  MEDICATIONS:  Current Outpatient Prescriptions  Medication Sig Dispense Refill  . ascorbic acid (VITAMIN C) 500 MG tablet Take 500 mg by mouth daily.      Marland Kitchen aspirin EC 81 MG tablet Take 81 mg by mouth every other day.      Marland Kitchen azithromycin (ZITHROMAX) 250 MG tablet Take 2 tablets by mouth on day one, then take 1 tablet by mouth daily until completed  6 each  0  . B Complex-C (B-COMPLEX WITH VITAMIN C) tablet Take 1 tablet by mouth daily.      . bortezomib IV (VELCADE) 3.5 MG injection Inject 1.3 mg/m2 into the vein 2 (two) times a week.      . CELEBREX 200 MG capsule Take 200 mg by mouth daily.       Marland Kitchen  cholecalciferol (VITAMIN D) 1000 UNITS tablet Take 1,000 Units by mouth daily.      Marland Kitchen dexamethasone (DECADRON) 4 MG tablet Take 10 tablets (40 mg total) by mouth once a week. Takes on Monday am.  120 tablet  0  . diphenhydrAMINE (BENADRYL) 25 MG tablet Take 50 mg by mouth every 6 (six) hours as needed. Itching      . fish oil-omega-3 fatty acids 1000 MG capsule Take 1 g by mouth daily.       Marland Kitchen glucosamine-chondroitin 500-400 MG tablet Take 1 tablet by mouth 3 (three) times daily.      Marland Kitchen HYDROcodone-acetaminophen (NORCO) 7.5-325 MG per tablet Take 1 tablet by mouth every 6 (six) hours as needed. Pain      . Ibuprofen (IBU-200 PO) Take 3 tablets by mouth daily as needed.      Marland Kitchen lenalidomide (REVLIMID) 25 MG capsule Take 1 capsule (25 mg total) by mouth daily. X 14 days every 3 weeks  14 capsule   0  . methocarbamol (ROBAXIN) 500 MG tablet Take 500 mg by mouth 3 (three) times daily as needed. Muscle spasm      . Multiple Vitamin (MULTIVITAMIN) capsule Take 1 capsule by mouth daily.      Marland Kitchen oxyCODONE (OXY IR/ROXICODONE) 5 MG immediate release tablet Take 5 mg by mouth every 8 (eight) hours as needed. Pain      . pantoprazole (PROTONIX) 40 MG tablet Take 40 mg by mouth daily.      . potassium chloride SA (K-DUR,KLOR-CON) 20 MEQ tablet Take 20 mEq by mouth daily.      . Probiotic Product (ALIGN) 4 MG CAPS Take 1 capsule by mouth daily.      . prochlorperazine (COMPAZINE) 10 MG tablet Take 10 mg by mouth every 6 (six) hours as needed. Nausea      . temazepam (RESTORIL) 30 MG capsule Take 1 capsule (30 mg total) by mouth at bedtime.  30 capsule  0  . warfarin (COUMADIN) 2 MG tablet Take 1 tablet (2 mg total) by mouth daily.  30 tablet  2  . zolendronic acid (ZOMETA) 4 MG/5ML injection Inject 4 mg into the vein every 28 (twenty-eight) days.       No current facility-administered medications for this visit.   Facility-Administered Medications Ordered in Other Visits  Medication Dose Route Frequency Provider Last Rate Last Dose  . ondansetron (ZOFRAN) tablet 8 mg  8 mg Oral Once Si Gaul, MD        SURGICAL HISTORY:  Past Surgical History  Procedure Date  . Total knee replaced 2000    Left knee  . Appendectomy   . Colonoscopy 2009  . Esophagogastroduodenoscopy 2009    mild inflammation at esophagogastric junction  . Excision of melanoma     back ( x3)  . Great toe surgery     bilateral  . Inguinal hernia repair     left, x 2  . Total knee arthroplasty 06/08/2011    Procedure: TOTAL KNEE ARTHROPLASTY;  Surgeon: Loreta Ave, MD;  Location: [facility] OR;  Service: Orthopedics;  Laterality: Right;  osteonics    REVIEW OF SYSTEMS:  Pertinent items are noted in HPI.   PHYSICAL EXAMINATION: General appearance: alert, cooperative and no distress Head: Normocephalic, without obvious  abnormality, atraumatic Neck: no adenopathy Lymph nodes: Cervical, supraclavicular, and axillary nodes normal. Resp: clear to auscultation bilaterally Cardio: regular rate and rhythm, S1, S2 normal, no murmur, click, rub or gallop GI: soft, non-tender;  bowel sounds normal; no masses,  no organomegaly Extremities: extremities normal, atraumatic, no cyanosis or edema Neurologic: Alert and oriented X 3, normal strength and tone. Normal symmetric reflexes. Normal coordination and gait Skin: left upper chest near clavicle, approximately 1 cm area of erythema, no fluctuance, or purulent material present, area is non-tender, minimal warmth  ECOG PERFORMANCE STATUS: 1 - Symptomatic but completely ambulatory  Blood pressure 133/73, pulse 75, temperature 97.7 F (36.5 C), temperature source Oral, resp. rate 20, height 5\' 10"  (1.778 m), weight 195 lb 11.2 oz (88.769 kg).  LABORATORY DATA: Lab Results  Component Value Date   WBC 4.9 07/16/2012   HGB 10.2* 07/16/2012   HCT 29.9* 07/16/2012   MCV 90.4 07/16/2012   PLT 344 07/16/2012      Chemistry      Component Value Date/Time   NA 134* 07/16/2012 1446   NA 124* 02/20/2012 1317   K 3.8 07/16/2012 1446   K 3.9 02/20/2012 1317   CL 108* 07/16/2012 1446   CL 101 02/20/2012 1317   CO2 23 07/16/2012 1446   CO2 24 02/20/2012 1317   BUN 10.0 07/16/2012 1446   BUN 9 02/20/2012 1317   CREATININE 1.0 07/16/2012 1446   CREATININE 0.92 02/20/2012 1317   CREATININE 0.98 01/18/2012 0934      Component Value Date/Time   CALCIUM 9.0 07/16/2012 1446   CALCIUM 7.7* 02/20/2012 1317   ALKPHOS 57 07/16/2012 1446   ALKPHOS 48 02/20/2012 1317   AST 22 07/16/2012 1446   AST 38* 02/20/2012 1317   ALT 15 07/16/2012 1446   ALT 33 02/20/2012 1317   BILITOT 0.23 07/16/2012 1446   BILITOT 0.2* 02/20/2012 1317     BONE MARROW REPORT FINAL DIAGNOSIS Bone Marrow, Aspirate,Biopsy, and Clot, Left superior posterior iliac - HYPERCELLULAR BONE MARROW FOR AGE WITH RESIDUAL PLASMA CELL NEOPLASM - SEE  COMMENT. PERIPHERAL BLOOD: - NORMOCYTIC-NORMOCHROMIC ANEMIA. Diagnosis Note The plasma cells are increased in number, representing 30% in the aspirate, associated with interstitial infiltrates and numerous variably sized aggregates in the clot sections. Immunohistochemical stains show that the plasma cells are lambda light chain restricted consistent with residual plasma cell neoplasm. (BNS:kh 05-22-12) Guerry Bruin MD Pathologist, Electronic Signature (Case signed 05/22/2012)  RADIOGRAPHIC STUDIES: No results found.  ASSESSMENT/PLAN: This is a very pleasant 71 years old white male with history of multiple myeloma status post 4 cycles of systemic therapy with Revlimid, Velcade and Decadron with partial response on the recent multiple myeloma panel as well as a bone marrow biopsy and aspirate. The patient is currently undergoing another 4 cycles of systemic chemotherapy with Revlimid Velcade and Decadron. He is status post 2 cycles of his second set of 4 cycles. Patient was discussed with Dr. Arbutus Ped. He'll proceed with cycle #3 as scheduled.He will return on 08/13/12 prior to cycle number four of the second set of four cycles of Revlimid and Velcade and decadron. He will also be due for Zometa at that visit. Marland KitchenLaural Benes, Tulio Facundo E, PA-C   All questions were answered. The patient knows to call the clinic with any problems, questions or concerns. We can certainly see the patient much sooner if necessary.  I spent 20 minutes counseling the patient face to face. The total time spent in the appointment was 30 minutes.

## 2012-07-17 NOTE — Telephone Encounter (Signed)
appts made and will print for pt at 07/19/12 appt

## 2012-07-19 ENCOUNTER — Ambulatory Visit (HOSPITAL_BASED_OUTPATIENT_CLINIC_OR_DEPARTMENT_OTHER): Payer: Medicare Other

## 2012-07-19 VITALS — BP 128/56 | HR 68 | Temp 96.7°F

## 2012-07-19 DIAGNOSIS — Z5112 Encounter for antineoplastic immunotherapy: Secondary | ICD-10-CM

## 2012-07-19 DIAGNOSIS — C9000 Multiple myeloma not having achieved remission: Secondary | ICD-10-CM

## 2012-07-19 MED ORDER — ONDANSETRON HCL 8 MG PO TABS
8.0000 mg | ORAL_TABLET | Freq: Once | ORAL | Status: AC
  Administered 2012-07-19: 8 mg via ORAL

## 2012-07-19 MED ORDER — BORTEZOMIB CHEMO SQ INJECTION 3.5 MG (2.5MG/ML)
1.3000 mg/m2 | Freq: Once | INTRAMUSCULAR | Status: AC
  Administered 2012-07-19: 2.75 mg via SUBCUTANEOUS
  Filled 2012-07-19: qty 2.75

## 2012-07-19 NOTE — Patient Instructions (Addendum)
[facility] Discharge Instructions for Patients Receiving Chemotherapy  Today you received the following chemotherapy agents Velcade.  To help prevent nausea and vomiting after your treatment, we encourage you to take your nausea medication as prescribed.   If you develop nausea and vomiting that is not controlled by your nausea medication, call the clinic. If it is after clinic hours your family physician or the after hours number for the clinic or go to the Emergency Department.   BELOW ARE SYMPTOMS THAT SHOULD BE REPORTED IMMEDIATELY:  *FEVER GREATER THAN 100.5 F  *CHILLS WITH OR WITHOUT FEVER  NAUSEA AND VOMITING THAT IS NOT CONTROLLED WITH YOUR NAUSEA MEDICATION  *UNUSUAL SHORTNESS OF BREATH  *UNUSUAL BRUISING OR BLEEDING  TENDERNESS IN MOUTH AND THROAT WITH OR WITHOUT PRESENCE OF ULCERS  *URINARY PROBLEMS  *BOWEL PROBLEMS  UNUSUAL RASH Items with * indicate a potential emergency and should be followed up as soon as possible.  Feel free to call the clinic you have any questions or concerns. The clinic phone number is [phone].   I have been informed and understand all the instructions given to me. I know to contact the clinic, my physician, or go to the Emergency Department if any problems should occur. I do not have any questions at this time, but understand that I may call the clinic during office hours   should I have any questions or need assistance in obtaining follow up care.    __________________________________________  _____________  __________ Signature of Patient or Authorized Representative            Date                   Time    __________________________________________ Nurse's Signature    

## 2012-07-21 ENCOUNTER — Encounter: Payer: Self-pay | Admitting: Internal Medicine

## 2012-07-22 ENCOUNTER — Encounter: Payer: Self-pay | Admitting: Internal Medicine

## 2012-07-23 ENCOUNTER — Encounter: Payer: Self-pay | Admitting: Physician Assistant

## 2012-07-23 ENCOUNTER — Other Ambulatory Visit: Payer: Self-pay | Admitting: Physician Assistant

## 2012-07-23 ENCOUNTER — Ambulatory Visit (HOSPITAL_BASED_OUTPATIENT_CLINIC_OR_DEPARTMENT_OTHER): Payer: Medicare Other

## 2012-07-23 ENCOUNTER — Other Ambulatory Visit (HOSPITAL_BASED_OUTPATIENT_CLINIC_OR_DEPARTMENT_OTHER): Payer: Medicare Other | Admitting: Lab

## 2012-07-23 VITALS — BP 119/68 | HR 72 | Temp 97.0°F | Resp 18

## 2012-07-23 DIAGNOSIS — B029 Zoster without complications: Secondary | ICD-10-CM

## 2012-07-23 DIAGNOSIS — J40 Bronchitis, not specified as acute or chronic: Secondary | ICD-10-CM

## 2012-07-23 DIAGNOSIS — Z5112 Encounter for antineoplastic immunotherapy: Secondary | ICD-10-CM

## 2012-07-23 DIAGNOSIS — C9000 Multiple myeloma not having achieved remission: Secondary | ICD-10-CM

## 2012-07-23 LAB — CBC WITH DIFFERENTIAL/PLATELET
Basophils Absolute: 0 10*3/uL (ref 0.0–0.1)
Eosinophils Absolute: 0.2 10*3/uL (ref 0.0–0.5)
HCT: 28 % — ABNORMAL LOW (ref 38.4–49.9)
HGB: 9.6 g/dL — ABNORMAL LOW (ref 13.0–17.1)
LYMPH%: 17.7 % (ref 14.0–49.0)
MCV: 88.3 fL (ref 79.3–98.0)
MONO#: 0.3 10*3/uL (ref 0.1–0.9)
MONO%: 5.3 % (ref 0.0–14.0)
NEUT#: 4.3 10*3/uL (ref 1.5–6.5)
NEUT%: 72.7 % (ref 39.0–75.0)
Platelets: 213 10*3/uL (ref 140–400)
WBC: 5.9 10*3/uL (ref 4.0–10.3)

## 2012-07-23 LAB — COMPREHENSIVE METABOLIC PANEL (CC13)
BUN: 6 mg/dL — ABNORMAL LOW (ref 7.0–26.0)
CO2: 23 mEq/L (ref 22–29)
Creatinine: 0.9 mg/dL (ref 0.7–1.3)
Glucose: 116 mg/dl — ABNORMAL HIGH (ref 70–99)
Sodium: 130 mEq/L — ABNORMAL LOW (ref 136–145)
Total Bilirubin: 0.27 mg/dL (ref 0.20–1.20)
Total Protein: 10.8 g/dL — ABNORMAL HIGH (ref 6.4–8.3)

## 2012-07-23 LAB — LACTATE DEHYDROGENASE (CC13): LDH: 122 U/L — ABNORMAL LOW (ref 125–245)

## 2012-07-23 MED ORDER — FAMCICLOVIR 500 MG PO TABS: 500.0000 mg | ORAL_TABLET | Freq: Three times a day (TID) | ORAL | Status: DC

## 2012-07-23 MED ORDER — ONDANSETRON HCL 8 MG PO TABS
8.0000 mg | ORAL_TABLET | Freq: Once | ORAL | Status: AC
  Administered 2012-07-23: 8 mg via ORAL

## 2012-07-23 MED ORDER — BORTEZOMIB CHEMO SQ INJECTION 3.5 MG (2.5MG/ML)
1.3000 mg/m2 | Freq: Once | INTRAMUSCULAR | Status: AC
  Administered 2012-07-23: 2.75 mg via SUBCUTANEOUS
  Filled 2012-07-23: qty 2.75

## 2012-07-23 MED ORDER — AZITHROMYCIN 250 MG PO TABS: ORAL_TABLET | ORAL | Status: DC

## 2012-07-23 NOTE — Patient Instructions (Signed)
[facility] Discharge Instructions for Patients Receiving Chemotherapy  Today you received the following chemotherapy agents Velcade.  To help prevent nausea and vomiting after your treatment, we encourage you to take your nausea medication as prescribed.   If you develop nausea and vomiting that is not controlled by your nausea medication, call the clinic. If it is after clinic hours your family physician or the after hours number for the clinic or go to the Emergency Department.   BELOW ARE SYMPTOMS THAT SHOULD BE REPORTED IMMEDIATELY:  *FEVER GREATER THAN 100.5 F  *CHILLS WITH OR WITHOUT FEVER  NAUSEA AND VOMITING THAT IS NOT CONTROLLED WITH YOUR NAUSEA MEDICATION  *UNUSUAL SHORTNESS OF BREATH  *UNUSUAL BRUISING OR BLEEDING  TENDERNESS IN MOUTH AND THROAT WITH OR WITHOUT PRESENCE OF ULCERS  *URINARY PROBLEMS  *BOWEL PROBLEMS  UNUSUAL RASH Items with * indicate a potential emergency and should be followed up as soon as possible.  One of the nurses will contact you 24 hours after your treatment. Please let the nurse know about any problems that you may have experienced. Feel free to call the clinic you have any questions or concerns. The clinic phone number is [phone].   I have been informed and understand all the instructions given to me. I know to contact the clinic, my physician, or go to the Emergency Department if any problems should occur. I do not have any questions at this time, but understand that I may call the clinic during office hours   should I have any questions or need assistance in obtaining follow up care.    __________________________________________  _____________  __________ Signature of Patient or Authorized Representative            Date                   Time    __________________________________________ Nurse's Signature    

## 2012-07-23 NOTE — Progress Notes (Signed)
Patient presented for his Velcade injection complaining of a productive cough. He also states that red area on his left anterior chest did progress to develop fluid filled areas with a "stinging pain". He had a few sammler similar lesions develop up the left side of his neck and behind the left ear. Observation of the lesions and distribution are consistent with a Herpes zoster type infection. The patient was discussed with Dr. Arbutus Ped. A Z-pak was sent to his pharmacy of record for his symptoms consistent with bronchitis as well as a prescription for Vatrex for the H.zoster eruptions was sent to his pharmacy of record via E.scribe .  Laural Benes, Verena Shawgo E, PA-C

## 2012-07-23 NOTE — Progress Notes (Signed)
Patient complains of scabbed rash  on left upper chest and behind left ear. Patient states he has had chills for the past 3 days. Tiana Loft, PA at chairside.  Ok to treat per Tiana Loft, PA

## 2012-07-26 ENCOUNTER — Ambulatory Visit (HOSPITAL_BASED_OUTPATIENT_CLINIC_OR_DEPARTMENT_OTHER): Payer: Medicare Other

## 2012-07-26 ENCOUNTER — Other Ambulatory Visit: Payer: Self-pay | Admitting: Medical Oncology

## 2012-07-26 VITALS — BP 108/64 | HR 66 | Temp 98.3°F

## 2012-07-26 DIAGNOSIS — C9000 Multiple myeloma not having achieved remission: Secondary | ICD-10-CM

## 2012-07-26 DIAGNOSIS — Z5112 Encounter for antineoplastic immunotherapy: Secondary | ICD-10-CM

## 2012-07-26 MED ORDER — LENALIDOMIDE 25 MG PO CAPS: 25.0000 mg | ORAL_CAPSULE | Freq: Every day | ORAL | Status: DC

## 2012-07-26 MED ORDER — BORTEZOMIB CHEMO SQ INJECTION 3.5 MG (2.5MG/ML)
1.3000 mg/m2 | Freq: Once | INTRAMUSCULAR | Status: AC
  Administered 2012-07-26: 2.75 mg via SUBCUTANEOUS
  Filled 2012-07-26: qty 2.75

## 2012-07-26 MED ORDER — ONDANSETRON HCL 8 MG PO TABS
8.0000 mg | ORAL_TABLET | Freq: Once | ORAL | Status: AC
  Administered 2012-07-26: 8 mg via ORAL

## 2012-07-26 NOTE — Patient Instructions (Signed)
[facility] Discharge Instructions for Patients Receiving Chemotherapy  Today you received the following chemotherapy agents Velcade.  To help prevent nausea and vomiting after your treatment, we encourage you to take your nausea medication as prescribed.   If you develop nausea and vomiting that is not controlled by your nausea medication, call the clinic. If it is after clinic hours your family physician or the after hours number for the clinic or go to the Emergency Department.   BELOW ARE SYMPTOMS THAT SHOULD BE REPORTED IMMEDIATELY:  *FEVER GREATER THAN 100.5 F  *CHILLS WITH OR WITHOUT FEVER  NAUSEA AND VOMITING THAT IS NOT CONTROLLED WITH YOUR NAUSEA MEDICATION  *UNUSUAL SHORTNESS OF BREATH  *UNUSUAL BRUISING OR BLEEDING  TENDERNESS IN MOUTH AND THROAT WITH OR WITHOUT PRESENCE OF ULCERS  *URINARY PROBLEMS  *BOWEL PROBLEMS  UNUSUAL RASH Items with * indicate a potential emergency and should be followed up as soon as possible.  One of the nurses will contact you 24 hours after your treatment. Please let the nurse know about any problems that you may have experienced. Feel free to call the clinic you have any questions or concerns. The clinic phone number is [phone].   I have been informed and understand all the instructions given to me. I know to contact the clinic, my physician, or go to the Emergency Department if any problems should occur. I do not have any questions at this time, but understand that I may call the clinic during office hours   should I have any questions or need assistance in obtaining follow up care.    __________________________________________  _____________  __________ Signature of Patient or Authorized Representative            Date                   Time    __________________________________________ Nurse's Signature    

## 2012-07-26 NOTE — Telephone Encounter (Signed)
Faxed revlimid rx to [facility]

## 2012-07-26 NOTE — Telephone Encounter (Signed)
Pt stated he received call from [facility] that his revlimid was ready. Revlimid inadvertently e-scribed to [facility]. I called [facility] and left message for  pharmacist to cancel revlimid order .  I   faxed revlimid order to [facility].. Pt notified.

## 2012-07-27 ENCOUNTER — Other Ambulatory Visit: Payer: Self-pay | Admitting: *Deleted

## 2012-07-27 NOTE — Telephone Encounter (Signed)
Opened in error

## 2012-07-30 ENCOUNTER — Other Ambulatory Visit: Payer: Medicare Other | Admitting: Lab

## 2012-08-06 ENCOUNTER — Ambulatory Visit (HOSPITAL_BASED_OUTPATIENT_CLINIC_OR_DEPARTMENT_OTHER): Payer: Medicare Other

## 2012-08-06 ENCOUNTER — Other Ambulatory Visit (HOSPITAL_BASED_OUTPATIENT_CLINIC_OR_DEPARTMENT_OTHER): Payer: Medicare Other | Admitting: Lab

## 2012-08-06 VITALS — BP 121/65 | HR 69 | Temp 97.7°F

## 2012-08-06 DIAGNOSIS — Z5112 Encounter for antineoplastic immunotherapy: Secondary | ICD-10-CM

## 2012-08-06 DIAGNOSIS — C9000 Multiple myeloma not having achieved remission: Secondary | ICD-10-CM

## 2012-08-06 LAB — CBC WITH DIFFERENTIAL/PLATELET
BASO%: 4 % — ABNORMAL HIGH (ref 0.0–2.0)
Eosinophils Absolute: 0 10*3/uL (ref 0.0–0.5)
HCT: 29 % — ABNORMAL LOW (ref 38.4–49.9)
LYMPH%: 17.9 % (ref 14.0–49.0)
MCHC: 33.2 g/dL (ref 32.0–36.0)
MONO#: 0.2 10*3/uL (ref 0.1–0.9)
NEUT#: 2.8 10*3/uL (ref 1.5–6.5)
NEUT%: 73.1 % (ref 39.0–75.0)
Platelets: 350 10*3/uL (ref 140–400)
RBC: 3.26 10*6/uL — ABNORMAL LOW (ref 4.20–5.82)
WBC: 3.8 10*3/uL — ABNORMAL LOW (ref 4.0–10.3)
lymph#: 0.7 10*3/uL — ABNORMAL LOW (ref 0.9–3.3)

## 2012-08-06 LAB — COMPREHENSIVE METABOLIC PANEL (CC13)
ALT: 25 U/L (ref 0–55)
AST: 38 U/L — ABNORMAL HIGH (ref 5–34)
Albumin: 2 g/dL — ABNORMAL LOW (ref 3.5–5.0)
Alkaline Phosphatase: 49 U/L (ref 40–150)
BUN: 14.7 mg/dL (ref 7.0–26.0)
CO2: 24 mEq/L (ref 22–29)
Calcium: 8.5 mg/dL (ref 8.4–10.4)
Chloride: 110 mEq/L — ABNORMAL HIGH (ref 98–107)
Creatinine: 0.9 mg/dL (ref 0.7–1.3)
Glucose: 106 mg/dl — ABNORMAL HIGH (ref 70–99)
Potassium: 4.1 mEq/L (ref 3.5–5.1)
Sodium: 134 mEq/L — ABNORMAL LOW (ref 136–145)
Total Bilirubin: 0.3 mg/dL (ref 0.20–1.20)
Total Protein: 10.9 g/dL — ABNORMAL HIGH (ref 6.4–8.3)

## 2012-08-06 LAB — LACTATE DEHYDROGENASE (CC13): LDH: 150 U/L (ref 125–245)

## 2012-08-06 MED ORDER — BORTEZOMIB CHEMO SQ INJECTION 3.5 MG (2.5MG/ML)
1.3000 mg/m2 | Freq: Once | INTRAMUSCULAR | Status: AC
  Administered 2012-08-06: 2.75 mg via SUBCUTANEOUS
  Filled 2012-08-06: qty 2.75

## 2012-08-06 MED ORDER — ONDANSETRON HCL 8 MG PO TABS
8.0000 mg | ORAL_TABLET | Freq: Once | ORAL | Status: AC
  Administered 2012-08-06: 8 mg via ORAL

## 2012-08-06 NOTE — Patient Instructions (Signed)
[facility] Discharge Instructions for Patients Receiving Chemotherapy  Today you received the following chemotherapy agents Velcade.  To help prevent nausea and vomiting after your treatment, we encourage you to take your nausea medication as prescribed.   If you develop nausea and vomiting that is not controlled by your nausea medication, call the clinic. If it is after clinic hours your family physician or the after hours number for the clinic or go to the Emergency Department.   BELOW ARE SYMPTOMS THAT SHOULD BE REPORTED IMMEDIATELY:  *FEVER GREATER THAN 100.5 F  *CHILLS WITH OR WITHOUT FEVER  NAUSEA AND VOMITING THAT IS NOT CONTROLLED WITH YOUR NAUSEA MEDICATION  *UNUSUAL SHORTNESS OF BREATH  *UNUSUAL BRUISING OR BLEEDING  TENDERNESS IN MOUTH AND THROAT WITH OR WITHOUT PRESENCE OF ULCERS  *URINARY PROBLEMS  *BOWEL PROBLEMS  UNUSUAL RASH Items with * indicate a potential emergency and should be followed up as soon as possible.  One of the nurses will contact you 24 hours after your treatment. Please let the nurse know about any problems that you may have experienced. Feel free to call the clinic you have any questions or concerns. The clinic phone number is [phone].   I have been informed and understand all the instructions given to me. I know to contact the clinic, my physician, or go to the Emergency Department if any problems should occur. I do not have any questions at this time, but understand that I may call the clinic during office hours   should I have any questions or need assistance in obtaining follow up care.    __________________________________________  _____________  __________ Signature of Patient or Authorized Representative            Date                   Time    __________________________________________ Nurse's Signature    

## 2012-08-07 ENCOUNTER — Telehealth: Payer: Self-pay | Admitting: Medical Oncology

## 2012-08-07 NOTE — Telephone Encounter (Signed)
[name] is going out of town March 15-18 and would like to get treatment on the 3/19 . Is this okay? I will consult with Dr Arbutus Ped.

## 2012-08-07 NOTE — Telephone Encounter (Signed)
Per Dr Arbutus Ped pt may switch chemo to the following week. Pt notified.

## 2012-08-09 ENCOUNTER — Ambulatory Visit (HOSPITAL_BASED_OUTPATIENT_CLINIC_OR_DEPARTMENT_OTHER): Payer: Medicare Other

## 2012-08-09 VITALS — BP 136/67 | HR 72 | Temp 97.9°F | Resp 20

## 2012-08-09 DIAGNOSIS — Z5112 Encounter for antineoplastic immunotherapy: Secondary | ICD-10-CM

## 2012-08-09 DIAGNOSIS — C9000 Multiple myeloma not having achieved remission: Secondary | ICD-10-CM

## 2012-08-09 MED ORDER — BORTEZOMIB CHEMO SQ INJECTION 3.5 MG (2.5MG/ML)
1.3000 mg/m2 | Freq: Once | INTRAMUSCULAR | Status: AC
  Administered 2012-08-09: 2.75 mg via SUBCUTANEOUS
  Filled 2012-08-09: qty 2.75

## 2012-08-09 MED ORDER — ONDANSETRON HCL 8 MG PO TABS
8.0000 mg | ORAL_TABLET | Freq: Once | ORAL | Status: AC
  Administered 2012-08-09: 8 mg via ORAL

## 2012-08-09 NOTE — Patient Instructions (Signed)
[facility] Discharge Instructions for Patients Receiving Chemotherapy  Today you received the following chemotherapy agents :  Velcade  SQ.  To help prevent nausea and vomiting after your treatment, we encourage you to take your nausea medication as instructed by your physician.    If you develop nausea and vomiting that is not controlled by your nausea medication, call the clinic. If it is after clinic hours your family physician or the after hours number for the clinic or go to the Emergency Department.   BELOW ARE SYMPTOMS THAT SHOULD BE REPORTED IMMEDIATELY:  *FEVER GREATER THAN 100.5 F  *CHILLS WITH OR WITHOUT FEVER  NAUSEA AND VOMITING THAT IS NOT CONTROLLED WITH YOUR NAUSEA MEDICATION  *UNUSUAL SHORTNESS OF BREATH  *UNUSUAL BRUISING OR BLEEDING  TENDERNESS IN MOUTH AND THROAT WITH OR WITHOUT PRESENCE OF ULCERS  *URINARY PROBLEMS  *BOWEL PROBLEMS  UNUSUAL RASH Items with * indicate a potential emergency and should be followed up as soon as possible.  One of the nurses will contact you 24 hours after your treatment. Please let the nurse know about any problems that you may have experienced. Feel free to call the clinic you have any questions or concerns. The clinic phone number is [phone].   I have been informed and understand all the instructions given to me. I know to contact the clinic, my physician, or go to the Emergency Department if any problems should occur. I do not have any questions at this time, but understand that I may call the clinic during office hours   should I have any questions or need assistance in obtaining follow up care.    __________________________________________  _____________  __________ Signature of Patient or Authorized Representative            Date                   Time    __________________________________________ Nurse's Signature    

## 2012-08-13 ENCOUNTER — Ambulatory Visit (HOSPITAL_BASED_OUTPATIENT_CLINIC_OR_DEPARTMENT_OTHER): Payer: Medicare Other

## 2012-08-13 ENCOUNTER — Encounter: Payer: Self-pay | Admitting: Physician Assistant

## 2012-08-13 ENCOUNTER — Other Ambulatory Visit: Payer: Medicare Other | Admitting: Lab

## 2012-08-13 ENCOUNTER — Telehealth: Payer: Self-pay | Admitting: *Deleted

## 2012-08-13 ENCOUNTER — Ambulatory Visit (HOSPITAL_BASED_OUTPATIENT_CLINIC_OR_DEPARTMENT_OTHER): Payer: Medicare Other | Admitting: Physician Assistant

## 2012-08-13 ENCOUNTER — Other Ambulatory Visit (HOSPITAL_BASED_OUTPATIENT_CLINIC_OR_DEPARTMENT_OTHER): Payer: Medicare Other | Admitting: Lab

## 2012-08-13 ENCOUNTER — Telehealth: Payer: Self-pay | Admitting: Internal Medicine

## 2012-08-13 VITALS — BP 110/66 | HR 68 | Temp 97.0°F | Resp 20 | Ht 70.0 in | Wt 196.0 lb

## 2012-08-13 DIAGNOSIS — C9000 Multiple myeloma not having achieved remission: Secondary | ICD-10-CM

## 2012-08-13 DIAGNOSIS — Z5112 Encounter for antineoplastic immunotherapy: Secondary | ICD-10-CM

## 2012-08-13 LAB — COMPREHENSIVE METABOLIC PANEL (CC13)
ALT: 14 U/L (ref 0–55)
AST: 18 U/L (ref 5–34)
Albumin: 2.1 g/dL — ABNORMAL LOW (ref 3.5–5.0)
Alkaline Phosphatase: 44 U/L (ref 40–150)
BUN: 10.6 mg/dL (ref 7.0–26.0)
Calcium: 9.9 mg/dL (ref 8.4–10.4)
Chloride: 105 mEq/L (ref 98–107)
Potassium: 4.1 mEq/L (ref 3.5–5.1)
Sodium: 133 mEq/L — ABNORMAL LOW (ref 136–145)
Total Protein: 11.3 g/dL — ABNORMAL HIGH (ref 6.4–8.3)

## 2012-08-13 LAB — CBC WITH DIFFERENTIAL/PLATELET
BASO%: 1.1 % (ref 0.0–2.0)
Basophils Absolute: 0.1 10*3/uL (ref 0.0–0.1)
EOS%: 3 % (ref 0.0–7.0)
HGB: 9.9 g/dL — ABNORMAL LOW (ref 13.0–17.1)
MCH: 29.4 pg (ref 27.2–33.4)
MONO%: 4.1 % (ref 0.0–14.0)
NEUT#: 3.9 10*3/uL (ref 1.5–6.5)
RBC: 3.35 10*6/uL — ABNORMAL LOW (ref 4.20–5.82)
RDW: 18.7 % — ABNORMAL HIGH (ref 11.0–14.6)
lymph#: 1.1 10*3/uL (ref 0.9–3.3)

## 2012-08-13 MED ORDER — TEMAZEPAM 30 MG PO CAPS: 30.0000 mg | ORAL_CAPSULE | Freq: Every day | ORAL | Status: DC

## 2012-08-13 MED ORDER — ZOLEDRONIC ACID 4 MG/100ML IV SOLN
4.0000 mg | Freq: Once | INTRAVENOUS | Status: AC
  Administered 2012-08-13: 4 mg via INTRAVENOUS
  Filled 2012-08-13: qty 100

## 2012-08-13 MED ORDER — SODIUM CHLORIDE 0.9 % IV SOLN
Freq: Once | INTRAVENOUS | Status: AC
  Administered 2012-08-13: 12:00:00 via INTRAVENOUS

## 2012-08-13 MED ORDER — PROCHLORPERAZINE MALEATE 10 MG PO TABS: 10.0000 mg | ORAL_TABLET | Freq: Four times a day (QID) | ORAL | Status: DC | PRN

## 2012-08-13 MED ORDER — BORTEZOMIB CHEMO SQ INJECTION 3.5 MG (2.5MG/ML)
1.3000 mg/m2 | Freq: Once | INTRAMUSCULAR | Status: AC
  Administered 2012-08-13: 2.75 mg via SUBCUTANEOUS
  Filled 2012-08-13: qty 2.75

## 2012-08-13 MED ORDER — ONDANSETRON HCL 8 MG PO TABS
8.0000 mg | ORAL_TABLET | Freq: Once | ORAL | Status: AC
  Administered 2012-08-13: 8 mg via ORAL

## 2012-08-13 MED ORDER — DEXAMETHASONE 4 MG PO TABS: 40.0000 mg | ORAL_TABLET | ORAL | Status: DC

## 2012-08-13 NOTE — Progress Notes (Signed)
[facility] [phone]   [phone]  OFFICE PROGRESS NOTE  [name],[name], MD [address] [place] [address]  DIAGNOSIS: Stage IIA multiple myeloma diagnosed in July of 2013   PRIOR THERAPY: systemic chemotherapy with subcutaneous Velcade 1.3 mg/M2 on days 1, 4, 8 and 11 every 3 weeks in addition to Revlimid 25 mg by mouth daily for 14 days every 3 weeks and dexamethasone 40 mg on a weekly basis. Status post 4 planned cycles   CURRENT THERAPY:  1.    Systemic chemotherapy with subcutaneous Velcade 1.3 mg/M2 on days 1, 4, 8 and 11 every 3 weeks in addition to Revlimid 25 mg by mouth daily for 14 days every 3 weeks and dexamethasone 40 mg on a weekly basis. 4 more cycles are planned and he is status post 3 cycles of the second set of 4 cycles 2. Prophylactic dose Coumadin at 2 mg by mouth daily 3. Zometa 4 mg IV given a monthly basis for bone disease  INTERVAL HISTORY: [name] 72 y.o. male returns to the clinic today for followup visit. He is tolerating his chemotherapy with subcutaneous Velcade and Revlimid and dexamethasone without difficulty. He presents to proceed with his scheduled Zometa infusion. He reports that his episode of shingles resolved with the course of Famvir. He also had resolution of his bronchitis with the Z-Pak that we prescribed. He and his wife enjoyed their cruise without incident.  He voiced no other specific complaints today. He denied having any significant weight loss or night sweats. He denied having any bleeding issues. He denied having any significant chest pain, shortness breath, cough or hemoptysis.   MEDICAL HISTORY: Past Medical History  Diagnosis Date  . GERD (gastroesophageal reflux disease)   . Adenomatous polyp of colon 1996  . Arthritis     Osteoarthritis right knee, spine, wrist  . Benign prostatic hypertrophy     (Dr. Vernie Ammons)  . History of chronic prostatitis   . History of melanoma      Back (Dr. Karlyn Agee); early melanoma R arm 03/2011  . Diverticulosis   . Arthritis of hand, right     Right thumb (Dr. Teressa Senter)  . Degenerative disc disease   . Hemorrhoids   . Erosive esophagitis   . Back ache     r/o Multiple Myeloma    ALLERGIES:  is allergic to penicillins.  MEDICATIONS:  Current Outpatient Prescriptions  Medication Sig Dispense Refill  . ascorbic acid (VITAMIN C) 500 MG tablet Take 500 mg by mouth daily.      Marland Kitchen aspirin EC 81 MG tablet Take 81 mg by mouth every other day.      Marland Kitchen azithromycin (ZITHROMAX) 250 MG tablet Take 2 tablets by mouth on day one, then take 1 tablet by mouth daily until completed  6 each  0  . B Complex-C (B-COMPLEX WITH VITAMIN C) tablet Take 1 tablet by mouth daily.      . bortezomib IV (VELCADE) 3.5 MG injection Inject 1.3 mg/m2 into the vein 2 (two) times a week.      . CELEBREX 200 MG capsule Take 200 mg by mouth daily.       . cholecalciferol (VITAMIN D) 1000 UNITS tablet Take 1,000 Units by mouth daily.      Marland Kitchen dexamethasone (DECADRON) 4 MG tablet Take 10 tablets (40 mg total) by mouth once a week. Takes on Monday am.  120 tablet  0  . diphenhydrAMINE (  BENADRYL) 25 MG tablet Take 50 mg by mouth every 6 (six) hours as needed. Itching      . famciclovir (FAMVIR) 500 MG tablet Take 1 tablet (500 mg total) by mouth 3 (three) times daily.  21 tablet  0  . fish oil-omega-3 fatty acids 1000 MG capsule Take 1 g by mouth daily.       Marland Kitchen glucosamine-chondroitin 500-400 MG tablet Take 1 tablet by mouth 3 (three) times daily.      Marland Kitchen HYDROcodone-acetaminophen (NORCO) 7.5-325 MG per tablet Take 1 tablet by mouth every 6 (six) hours as needed. Pain      . Ibuprofen (IBU-200 PO) Take 3 tablets by mouth daily as needed.      Marland Kitchen lenalidomide (REVLIMID) 25 MG capsule Take 1 capsule (25 mg total) by mouth daily. X 14 days every 3 weeks  14 capsule  0  . methocarbamol (ROBAXIN) 500 MG tablet Take 500 mg by mouth 3 (three) times daily as needed. Muscle spasm       . Multiple Vitamin (MULTIVITAMIN) capsule Take 1 capsule by mouth daily.      Marland Kitchen oxyCODONE (OXY IR/ROXICODONE) 5 MG immediate release tablet Take 5 mg by mouth every 8 (eight) hours as needed. Pain      . pantoprazole (PROTONIX) 40 MG tablet Take 40 mg by mouth daily.      . potassium chloride SA (K-DUR,KLOR-CON) 20 MEQ tablet Take 20 mEq by mouth daily.      . Probiotic Product (ALIGN) 4 MG CAPS Take 1 capsule by mouth daily.      . prochlorperazine (COMPAZINE) 10 MG tablet Take 1 tablet (10 mg total) by mouth every 6 (six) hours as needed. Nausea  30 tablet  1  . temazepam (RESTORIL) 30 MG capsule Take 1 capsule (30 mg total) by mouth at bedtime.  30 capsule  0  . warfarin (COUMADIN) 2 MG tablet Take 1 tablet (2 mg total) by mouth daily.  30 tablet  2  . zolendronic acid (ZOMETA) 4 MG/5ML injection Inject 4 mg into the vein every 28 (twenty-eight) days.       No current facility-administered medications for this visit.   Facility-Administered Medications Ordered in Other Visits  Medication Dose Route Frequency Provider Last Rate Last Dose  . 0.9 %  sodium chloride infusion   Intravenous Once Conni Slipper, PA      . bortezomib SQ (VELCADE) chemo injection 2.75 mg  1.3 mg/m2 (Treatment Plan Actual) Subcutaneous Once Si Gaul, MD      . ondansetron [facility]) tablet 8 mg  8 mg Oral Once Si Gaul, MD      . Zoledronic Acid (ZOMETA) 4 mg IVPB  4 mg Intravenous Once Si Gaul, MD        SURGICAL HISTORY:  Past Surgical History  Procedure Date  . Total knee replaced 2000    Left knee  . Appendectomy   . Colonoscopy 2009  . Esophagogastroduodenoscopy 2009    mild inflammation at esophagogastric junction  . Excision of melanoma     back ( x3)  . Great toe surgery     bilateral  . Inguinal hernia repair     left, x 2  . Total knee arthroplasty 06/08/2011    Procedure: TOTAL KNEE ARTHROPLASTY;  Surgeon: Loreta Ave, MD;  Location: [facility] OR;  Service: Orthopedics;   Laterality: Right;  osteonics    REVIEW OF SYSTEMS:  Pertinent items are noted in HPI.   PHYSICAL EXAMINATION:  General appearance: alert, cooperative and no distress Head: Normocephalic, without obvious abnormality, atraumatic Neck: no adenopathy Lymph nodes: Cervical, supraclavicular, and axillary nodes normal. Resp: clear to auscultation bilaterally Cardio: regular rate and rhythm, S1, S2 normal, no murmur, click, rub or gallop GI: soft, non-tender; bowel sounds normal; no masses,  no organomegaly Extremities: extremities normal, atraumatic, no cyanosis or edema Neurologic: Alert and oriented X 3, normal strength and tone. Normal symmetric reflexes. Normal coordination and gait Skin: left upper chest near clavicle, approximately 1 cm area of erythema, no fluctuance, or purulent material present, area is non-tender, no warmth. Lesions that were along the left side of the neck and behind the left ear have resolved  ECOG PERFORMANCE STATUS: 1 - Symptomatic but completely ambulatory  Blood pressure 110/66, pulse 68, temperature 97 F (36.1 C), temperature source Oral, resp. rate 20, height 5\' 10"  (1.778 m), weight 196 lb (88.905 kg).  LABORATORY DATA: Lab Results  Component Value Date   WBC 5.4 08/13/2012   HGB 9.9* 08/13/2012   HCT 29.4* 08/13/2012   MCV 87.8 08/13/2012   PLT 240 08/13/2012      Chemistry      Component Value Date/Time   NA 133* 08/13/2012 1017   NA 124* 02/20/2012 1317   K 4.1 08/13/2012 1017   K 3.9 02/20/2012 1317   CL 105 08/13/2012 1017   CL 101 02/20/2012 1317   CO2 26 08/13/2012 1017   CO2 24 02/20/2012 1317   BUN 10.6 08/13/2012 1017   BUN 9 02/20/2012 1317   CREATININE 1.1 08/13/2012 1017   CREATININE 0.92 02/20/2012 1317   CREATININE 0.98 01/18/2012 0934      Component Value Date/Time   CALCIUM 9.9 08/13/2012 1017   CALCIUM 7.7* 02/20/2012 1317   ALKPHOS 44 08/13/2012 1017   ALKPHOS 48 02/20/2012 1317   AST 18 08/13/2012 1017   AST 38* 02/20/2012 1317   ALT 14 08/13/2012  1017   ALT 33 02/20/2012 1317   BILITOT 0.36 08/13/2012 1017   BILITOT 0.2* 02/20/2012 1317     BONE MARROW REPORT FINAL DIAGNOSIS Bone Marrow, Aspirate,Biopsy, and Clot, Left superior posterior iliac - HYPERCELLULAR BONE MARROW FOR AGE WITH RESIDUAL PLASMA CELL NEOPLASM - SEE COMMENT. PERIPHERAL BLOOD: - NORMOCYTIC-NORMOCHROMIC ANEMIA. Diagnosis Note The plasma cells are increased in number, representing 30% in the aspirate, associated with interstitial infiltrates and numerous variably sized aggregates in the clot sections. Immunohistochemical stains show that the plasma cells are lambda light chain restricted consistent with residual plasma cell neoplasm. (BNS:kh 05-22-12) Guerry Bruin MD Pathologist, Electronic Signature (Case signed 05/22/2012)  RADIOGRAPHIC STUDIES: No results found.  ASSESSMENT/PLAN: This is a very pleasant 72 years old white male with history of multiple myeloma status post 4 cycles of systemic therapy with Revlimid, Velcade and Decadron with partial response on the recent multiple myeloma panel as well as a bone marrow biopsy and aspirate. The patient is currently undergoing another 4 cycles of systemic chemotherapy with Revlimid Velcade and Decadron. He is status post 3 cycles of his second set of 4 cycles. Patient was discussed with Dr. Arbutus Ped. He'll proceed with cycle #4 today as scheduled.He'll have repeat protein studies consisting of a quantitative immunoglobin, beta 2 microglobulin, serum light chains and LDH drawn later this week to reevaluate his disease. He will followup with Dr. Arbutus Ped in 2 weeks prior to his next scheduled cycle of Revlimid, subcutaneous Velcade and dexamethasone to discuss the results of the protein studies and further treatment plans. Patient was  given a prescription for his Restoril, the prescriptions refills for his dexamethasone and Compazine were sent to his pharmacy of record via E. scribed to  .Laural Benes, Evonne Rinks E, PA-C   All  questions were answered. The patient knows to call the clinic with any problems, questions or concerns. We can certainly see the patient much sooner if necessary.  I spent 20 minutes counseling the patient face to face. The total time spent in the appointment was 30 minutes.

## 2012-08-13 NOTE — Telephone Encounter (Signed)
Per staff message and POF I have scheduled appts.  JMW  

## 2012-08-13 NOTE — Patient Instructions (Addendum)
[facility] Discharge Instructions for Patients Receiving Chemotherapy  Today you received the following chemotherapy agents :  Velcade.  To help prevent nausea and vomiting after your treatment, we encourage you to take your nausea medication as instructed by your physician. Zoledronic Acid injection (Hypercalcemia, Oncology) What is this medicine? ZOLEDRONIC ACID (ZOE le dron ik AS id) lowers the amount of calcium loss from bone. It is used to treat too much calcium in your blood from cancer. It is also used to prevent complications of cancer that has spread to the bone. This medicine may be used for other purposes; ask your health care provider or pharmacist if you have questions. What should I tell my health care provider before I take this medicine? They need to know if you have any of these conditions: -aspirin-sensitive asthma -dental disease -kidney disease -an unusual or allergic reaction to zoledronic acid, other medicines, foods, dyes, or preservatives -pregnant or trying to get pregnant -breast-feeding How should I use this medicine? This medicine is for infusion into a vein. It is given by a health care professional in a hospital or clinic setting. Talk to your pediatrician regarding the use of this medicine in children. Special care may be needed. Overdosage: If you think you have taken too much of this medicine contact a poison control center or emergency room at once. NOTE: This medicine is only for you. Do not share this medicine with others. What if I miss a dose? It is important not to miss your dose. Call your doctor or health care professional if you are unable to keep an appointment. What may interact with this medicine? -certain antibiotics given by injection -NSAIDs, medicines for pain and inflammation, like ibuprofen or naproxen -some diuretics like bumetanide, furosemide -teriparatide -thalidomide This list may not describe all possible  interactions. Give your health care provider a list of all the medicines, herbs, non-prescription drugs, or dietary supplements you use. Also tell them if you smoke, drink alcohol, or use illegal drugs. Some items may interact with your medicine. What should I watch for while using this medicine? Visit your doctor or health care professional for regular checkups. It may be some time before you see the benefit from this medicine. Do not stop taking your medicine unless your doctor tells you to. Your doctor may order blood tests or other tests to see how you are doing. Women should inform their doctor if they wish to become pregnant or think they might be pregnant. There is a potential for serious side effects to an unborn child. Talk to your health care professional or pharmacist for more information. You should make sure that you get enough calcium and vitamin D while you are taking this medicine. Discuss the foods you eat and the vitamins you take with your health care professional. Some people who take this medicine have severe bone, joint, and/or muscle pain. This medicine may also increase your risk for a broken thigh bone. Tell your doctor right away if you have pain in your upper leg or groin. Tell your doctor if you have any pain that does not go away or that gets worse. What side effects may I notice from receiving this medicine? Side effects that you should report to your doctor or health care professional as soon as possible: -allergic reactions like skin rash, itching or hives, swelling of the face, lips, or tongue -anxiety, confusion, or depression -breathing problems -changes in vision -feeling faint or lightheaded, falls -jaw burning, cramping,  pain -muscle cramps, stiffness, or weakness -trouble passing urine or change in the amount of urine Side effects that usually do not require medical attention (report to your doctor or health care professional if they continue or are  bothersome): -bone, joint, or muscle pain -fever -hair loss -irritation at site where injected -loss of appetite -nausea, vomiting -stomach upset -tired This list may not describe all possible side effects. Call your doctor for medical advice about side effects. You may report side effects to FDA at 1-800-FDA-1088. Where should I keep my medicine? This drug is given in a hospital or clinic and will not be stored at home. NOTE: This sheet is a summary. It may not cover all possible information. If you have questions about this medicine, talk to your doctor, pharmacist, or health care provider.  2013, [employer]vier/Gold Standard. (12/24/2010 9:06:58 AM)    If you develop nausea and vomiting that is not controlled by your nausea medication, call the clinic. If it is after clinic hours your family physician or the after hours number for the clinic or go to the Emergency Department.   BELOW ARE SYMPTOMS THAT SHOULD BE REPORTED IMMEDIATELY:  *FEVER GREATER THAN 100.5 F  *CHILLS WITH OR WITHOUT FEVER  NAUSEA AND VOMITING THAT IS NOT CONTROLLED WITH YOUR NAUSEA MEDICATION  *UNUSUAL SHORTNESS OF BREATH  *UNUSUAL BRUISING OR BLEEDING  TENDERNESS IN MOUTH AND THROAT WITH OR WITHOUT PRESENCE OF ULCERS  *URINARY PROBLEMS  *BOWEL PROBLEMS  UNUSUAL RASH Items with * indicate a potential emergency and should be followed up as soon as possible.  One of the nurses will contact you 24 hours after your treatment. Please let the nurse know about any problems that you may have experienced. Feel free to call the clinic you have any questions or concerns. The clinic phone number is [phone].   I have been informed and understand all the instructions given to me. I know to contact the clinic, my physician, or go to the Emergency Department if any problems should occur. I do not have any questions at this time, but understand that I may call the clinic during office hours   should I have any  questions or need assistance in obtaining follow up care.    __________________________________________  _____________  __________ Signature of Patient or Authorized Representative            Date                   Time    __________________________________________ Nurse's Signature

## 2012-08-13 NOTE — Patient Instructions (Addendum)
Continue taking your dexamethasone and Revlimid as instructed Follow up with Dr. Arbutus Ped in 2 weeks with repeat protein studies to re-evaluate your diease

## 2012-08-13 NOTE — Telephone Encounter (Signed)
gv and printed appt schedule for pt...emailed michelle to add tx...pt aware

## 2012-08-16 ENCOUNTER — Other Ambulatory Visit (HOSPITAL_BASED_OUTPATIENT_CLINIC_OR_DEPARTMENT_OTHER): Payer: Medicare Other | Admitting: Lab

## 2012-08-16 ENCOUNTER — Ambulatory Visit (HOSPITAL_BASED_OUTPATIENT_CLINIC_OR_DEPARTMENT_OTHER): Payer: Medicare Other

## 2012-08-16 ENCOUNTER — Other Ambulatory Visit: Payer: Self-pay | Admitting: Medical Oncology

## 2012-08-16 DIAGNOSIS — C9000 Multiple myeloma not having achieved remission: Secondary | ICD-10-CM

## 2012-08-16 DIAGNOSIS — Z5112 Encounter for antineoplastic immunotherapy: Secondary | ICD-10-CM

## 2012-08-16 LAB — LACTATE DEHYDROGENASE (CC13): LDH: 124 U/L — ABNORMAL LOW (ref 125–245)

## 2012-08-16 MED ORDER — BORTEZOMIB CHEMO SQ INJECTION 3.5 MG (2.5MG/ML)
1.3000 mg/m2 | Freq: Once | INTRAMUSCULAR | Status: AC
  Administered 2012-08-16: 2.75 mg via SUBCUTANEOUS
  Filled 2012-08-16: qty 2.75

## 2012-08-16 MED ORDER — LENALIDOMIDE 25 MG PO CAPS: 25.0000 mg | ORAL_CAPSULE | Freq: Every day | ORAL | Status: DC

## 2012-08-16 MED ORDER — ONDANSETRON HCL 8 MG PO TABS
8.0000 mg | ORAL_TABLET | Freq: Once | ORAL | Status: AC
  Administered 2012-08-16: 8 mg via ORAL

## 2012-08-16 NOTE — Telephone Encounter (Signed)
Revlimid rx faxed to [facility].

## 2012-08-20 LAB — SPEP & IFE WITH QIG
Albumin ELP: 29.6 % — ABNORMAL LOW (ref 55.8–66.1)
Alpha-1-Globulin: 4.5 % (ref 2.9–4.9)
Alpha-2-Globulin: 8.8 % (ref 7.1–11.8)
IgM, Serum: 35 mg/dL — ABNORMAL LOW (ref 41–251)
Total Protein, Serum Electrophoresis: 10.6 g/dL — ABNORMAL HIGH (ref 6.0–8.3)

## 2012-08-20 LAB — BETA 2 MICROGLOBULIN, SERUM: Beta-2 Microglobulin: 3.61 mg/L — ABNORMAL HIGH (ref 1.01–1.73)

## 2012-08-24 ENCOUNTER — Other Ambulatory Visit: Payer: Self-pay | Admitting: Medical Oncology

## 2012-08-24 DIAGNOSIS — C9000 Multiple myeloma not having achieved remission: Secondary | ICD-10-CM

## 2012-08-27 ENCOUNTER — Ambulatory Visit (HOSPITAL_BASED_OUTPATIENT_CLINIC_OR_DEPARTMENT_OTHER): Payer: Medicare Other

## 2012-08-27 ENCOUNTER — Encounter: Payer: Self-pay | Admitting: Internal Medicine

## 2012-08-27 ENCOUNTER — Telehealth: Payer: Self-pay | Admitting: Internal Medicine

## 2012-08-27 ENCOUNTER — Ambulatory Visit (HOSPITAL_BASED_OUTPATIENT_CLINIC_OR_DEPARTMENT_OTHER): Payer: Medicare Other | Admitting: Internal Medicine

## 2012-08-27 ENCOUNTER — Other Ambulatory Visit (HOSPITAL_BASED_OUTPATIENT_CLINIC_OR_DEPARTMENT_OTHER): Payer: Medicare Other

## 2012-08-27 VITALS — BP 106/62 | HR 66 | Temp 97.6°F | Resp 20 | Ht 70.0 in | Wt 195.3 lb

## 2012-08-27 DIAGNOSIS — Z5112 Encounter for antineoplastic immunotherapy: Secondary | ICD-10-CM

## 2012-08-27 DIAGNOSIS — C9000 Multiple myeloma not having achieved remission: Secondary | ICD-10-CM

## 2012-08-27 LAB — CBC WITH DIFFERENTIAL/PLATELET
Eosinophils Absolute: 0 10*3/uL (ref 0.0–0.5)
HCT: 30.2 % — ABNORMAL LOW (ref 38.4–49.9)
LYMPH%: 15.7 % (ref 14.0–49.0)
MONO#: 0.1 10*3/uL (ref 0.1–0.9)
NEUT#: 3 10*3/uL (ref 1.5–6.5)
NEUT%: 79.1 % — ABNORMAL HIGH (ref 39.0–75.0)
Platelets: 329 10*3/uL (ref 140–400)
WBC: 3.8 10*3/uL — ABNORMAL LOW (ref 4.0–10.3)

## 2012-08-27 LAB — COMPREHENSIVE METABOLIC PANEL (CC13)
CO2: 26 mEq/L (ref 22–29)
Creatinine: 1 mg/dL (ref 0.7–1.3)
Glucose: 96 mg/dl (ref 70–99)
Total Bilirubin: 0.32 mg/dL (ref 0.20–1.20)
Total Protein: 11.3 g/dL — ABNORMAL HIGH (ref 6.4–8.3)

## 2012-08-27 MED ORDER — LENALIDOMIDE 25 MG PO CAPS: 25.0000 mg | ORAL_CAPSULE | Freq: Every day | ORAL | Status: DC

## 2012-08-27 MED ORDER — BORTEZOMIB CHEMO SQ INJECTION 3.5 MG (2.5MG/ML)
1.3000 mg/m2 | Freq: Once | INTRAMUSCULAR | Status: AC
  Administered 2012-08-27: 2.75 mg via SUBCUTANEOUS
  Filled 2012-08-27: qty 2.75

## 2012-08-27 MED ORDER — ONDANSETRON HCL 8 MG PO TABS
8.0000 mg | ORAL_TABLET | Freq: Once | ORAL | Status: AC
  Administered 2012-08-27: 8 mg via ORAL

## 2012-08-27 NOTE — Telephone Encounter (Signed)
gv and printed appt schedule for march...s/w Tia to at tx.Marland Kitchen

## 2012-08-27 NOTE — Progress Notes (Signed)
[facility] [phone]   [phone]  OFFICE PROGRESS NOTE  [name],[name], MD [address] [place] [address]  DIAGNOSIS: Stage IIA multiple myeloma diagnosed in July of 2013   PRIOR THERAPY: systemic chemotherapy with subcutaneous Velcade 1.3 mg/M2 on days 1, 4, 8 and 11 every 3 weeks in addition to Revlimid 25 mg by mouth daily for 14 days every 3 weeks and dexamethasone 40 mg on a weekly basis. Status post 4 planned cycles   CURRENT THERAPY:  1. Systemic chemotherapy with subcutaneous Velcade 1.3 mg/M2 on days 1, 4, 8 and 11 every 3 weeks in addition to Revlimid 25 mg by mouth daily for 14 days every 3 weeks and dexamethasone 40 mg on a weekly basis. 4 more cycles are planned and he is status post 3 cycles of the second set of 4 cycles 2. Prophylactic dose Coumadin at 2 mg by mouth daily 3. Zometa 4 mg IV given a monthly basis for bone disease  INTERVAL HISTORY: DERON POOLE 72 y.o. male returns to the clinic today for followup visit. The patient is feeling fine today with no specific complaints. He is tolerating his systemic chemotherapy with subcutaneous Velcade, Revlimid and Decadron fairly well with no significant adverse effects except for mild fatigue. He denied having any aching pain. He is also tolerating his treatment with Zometa fairly well and he is currently on calcium supplements. The patient had repeat CBC, comprehensive metabolic panel, LDH and myeloma panel performed recently and he is here for evaluation and discussion of his lab results.  MEDICAL HISTORY: Past Medical History  Diagnosis Date  . GERD (gastroesophageal reflux disease)   . Adenomatous polyp of colon 1996  . Arthritis     Osteoarthritis right knee, spine, wrist  . Benign prostatic hypertrophy     (Dr. Vernie Ammons)  . History of chronic prostatitis   . History of melanoma     Back (Dr. Karlyn Agee); early melanoma R arm 03/2011  . Diverticulosis   .  Arthritis of hand, right     Right thumb (Dr. Teressa Senter)  . Degenerative disc disease   . Hemorrhoids   . Erosive esophagitis   . Back ache     r/o Multiple Myeloma    ALLERGIES:  is allergic to penicillins.  MEDICATIONS:  Current Outpatient Prescriptions  Medication Sig Dispense Refill  . ascorbic acid (VITAMIN C) 500 MG tablet Take 500 mg by mouth daily.      Marland Kitchen aspirin EC 81 MG tablet Take 81 mg by mouth every other day.      . B Complex-C (B-COMPLEX WITH VITAMIN C) tablet Take 1 tablet by mouth daily.      . bortezomib IV (VELCADE) 3.5 MG injection Inject 1.3 mg/m2 into the vein 2 (two) times a week.      . CELEBREX 200 MG capsule Take 200 mg by mouth daily.       . cholecalciferol (VITAMIN D) 1000 UNITS tablet Take 1,000 Units by mouth daily.      Marland Kitchen dexamethasone (DECADRON) 4 MG tablet Take 10 tablets (40 mg total) by mouth once a week. Takes on Monday am.  120 tablet  0  . diphenhydrAMINE (BENADRYL) 25 MG tablet Take 50 mg by mouth every 6 (six) hours as needed. Itching      . famciclovir (FAMVIR) 500 MG tablet Take 1 tablet (500 mg total) by mouth 3 (three) times daily.  21 tablet  0  .  fish oil-omega-3 fatty acids 1000 MG capsule Take 1 g by mouth daily.       Marland Kitchen glucosamine-chondroitin 500-400 MG tablet Take 1 tablet by mouth 3 (three) times daily.      Marland Kitchen HYDROcodone-acetaminophen (NORCO) 7.5-325 MG per tablet Take 1 tablet by mouth every 6 (six) hours as needed. Pain      . Ibuprofen (IBU-200 PO) Take 3 tablets by mouth daily as needed.      Marland Kitchen lenalidomide (REVLIMID) 25 MG capsule Take 1 capsule (25 mg total) by mouth daily. X 14 days every 3 weeks  14 capsule  0  . methocarbamol (ROBAXIN) 500 MG tablet Take 500 mg by mouth 3 (three) times daily as needed. Muscle spasm      . Multiple Vitamin (MULTIVITAMIN) capsule Take 1 capsule by mouth daily.      Marland Kitchen oxyCODONE (OXY IR/ROXICODONE) 5 MG immediate release tablet Take 5 mg by mouth every 8 (eight) hours as needed. Pain      .  pantoprazole (PROTONIX) 40 MG tablet Take 40 mg by mouth daily.      . potassium chloride SA (K-DUR,KLOR-CON) 20 MEQ tablet Take 20 mEq by mouth daily.      . Probiotic Product (ALIGN) 4 MG CAPS Take 1 capsule by mouth daily.      . prochlorperazine (COMPAZINE) 10 MG tablet Take 1 tablet (10 mg total) by mouth every 6 (six) hours as needed. Nausea  30 tablet  1  . temazepam (RESTORIL) 30 MG capsule Take 1 capsule (30 mg total) by mouth at bedtime.  30 capsule  0  . warfarin (COUMADIN) 2 MG tablet Take 1 tablet (2 mg total) by mouth daily.  30 tablet  2  . zolendronic acid (ZOMETA) 4 MG/5ML injection Inject 4 mg into the vein every 28 (twenty-eight) days.       No current facility-administered medications for this visit.   Facility-Administered Medications Ordered in Other Visits  Medication Dose Route Frequency Provider Last Rate Last Dose  . ondansetron (ZOFRAN) tablet 8 mg  8 mg Oral Once Si Gaul, MD        SURGICAL HISTORY:  Past Surgical History  Procedure Laterality Date  . Total knee replaced  2000    Left knee  . Appendectomy    . Colonoscopy  2009  . Esophagogastroduodenoscopy  2009    mild inflammation at esophagogastric junction  . Excision of melanoma      back ( x3)  . Great toe surgery      bilateral  . Inguinal hernia repair      left, x 2  . Total knee arthroplasty  06/08/2011    Procedure: TOTAL KNEE ARTHROPLASTY;  Surgeon: Loreta Ave, MD;  Location: [facility] OR;  Service: Orthopedics;  Laterality: Right;  osteonics    REVIEW OF SYSTEMS:  A comprehensive review of systems was negative.   PHYSICAL EXAMINATION: General appearance: alert, cooperative and no distress Head: Normocephalic, without obvious abnormality, atraumatic Neck: no adenopathy Lymph nodes: Cervical, supraclavicular, and axillary nodes normal. Resp: clear to auscultation bilaterally Cardio: regular rate and rhythm, S1, S2 normal, no murmur, click, rub or gallop GI: soft, non-tender;  bowel sounds normal; no masses,  no organomegaly Genitalia: defer exam Extremities: edema no edema Neurologic: Alert and oriented X 3, normal strength and tone. Normal symmetric reflexes. Normal coordination and gait  ECOG PERFORMANCE STATUS: 0 - Asymptomatic  Blood pressure 106/62, pulse 66, temperature 97.6 F (36.4 C), temperature source Oral, resp.  rate 20, height 5\' 10"  (1.778 m), weight 195 lb 4.8 oz (88.587 kg).  LABORATORY DATA: Lab Results  Component Value Date   WBC 3.8* 08/27/2012   HGB 10.0* 08/27/2012   HCT 30.2* 08/27/2012   MCV 88.6 08/27/2012   PLT 329 08/27/2012      Chemistry      Component Value Date/Time   NA 133* 08/13/2012 1017   NA 124* 02/20/2012 1317   K 4.1 08/13/2012 1017   K 3.9 02/20/2012 1317   CL 105 08/13/2012 1017   CL 101 02/20/2012 1317   CO2 26 08/13/2012 1017   CO2 24 02/20/2012 1317   BUN 10.6 08/13/2012 1017   BUN 9 02/20/2012 1317   CREATININE 1.1 08/13/2012 1017   CREATININE 0.92 02/20/2012 1317   CREATININE 0.98 01/18/2012 0934      Component Value Date/Time   CALCIUM 9.9 08/13/2012 1017   CALCIUM 7.7* 02/20/2012 1317   ALKPHOS 44 08/13/2012 1017   ALKPHOS 48 02/20/2012 1317   AST 18 08/13/2012 1017   AST 38* 02/20/2012 1317   ALT 14 08/13/2012 1017   ALT 33 02/20/2012 1317   BILITOT 0.36 08/13/2012 1017   BILITOT 0.2* 02/20/2012 1317     Other lab results: The 2 microglobulin 3.61, free kappa light chain 1.8, free lambda light chain 84.70, kappa/on that ratio 0.02, IgG 7570, IgA 46 and IgM 35. Total protein 10.6.   RADIOGRAPHIC STUDIES: No results found.  ASSESSMENT: This is a very pleasant 72 years old white male with history of multiple myeloma currently on systemic chemotherapy with Velcade, Revlimid and Decadron status post 8 cycles but unfortunately the patient continues to have persistent disease. He has no evidence for progression but also no significant improvement in his disease.  PLAN: I have a lengthy discussion with the patient today about his  condition. I recommended for him to continue on the current treatment regimen for now. I would consider switching his chemotherapy to a different regimen likely with Velcade, Doxil and Decadron versus introduction of one of the new agents like pomalyst or Carfilzomib  I also discussed with the patient referral to Dr. Marissa Calamity at [facility] for a second opinion before consideration of changing his chemotherapy.  He would come back for followup visit in 3 weeks as scheduled. He was advised to call me immediately if he has any concerning symptoms in the interval.  All questions were answered. The patient knows to call the clinic with any problems, questions or concerns. We can certainly see the patient much sooner if necessary.  I spent 15 minutes counseling the patient face to face. The total time spent in the appointment was 25 minutes.

## 2012-08-27 NOTE — Patient Instructions (Signed)
[facility] Discharge Instructions for Patients Receiving Chemotherapy  Today you received the following chemotherapy agents Velcade.  To help prevent nausea and vomiting after your treatment, we encourage you to take your nausea medication as prescribed.   If you develop nausea and vomiting that is not controlled by your nausea medication, call the clinic. If it is after clinic hours your family physician or the after hours number for the clinic or go to the Emergency Department.   BELOW ARE SYMPTOMS THAT SHOULD BE REPORTED IMMEDIATELY:  *FEVER GREATER THAN 100.5 F  *CHILLS WITH OR WITHOUT FEVER  NAUSEA AND VOMITING THAT IS NOT CONTROLLED WITH YOUR NAUSEA MEDICATION  *UNUSUAL SHORTNESS OF BREATH  *UNUSUAL BRUISING OR BLEEDING  TENDERNESS IN MOUTH AND THROAT WITH OR WITHOUT PRESENCE OF ULCERS  *URINARY PROBLEMS  *BOWEL PROBLEMS  UNUSUAL RASH Items with * indicate a potential emergency and should be followed up as soon as possible.  One of the nurses will contact you 24 hours after your treatment. Please let the nurse know about any problems that you may have experienced. Feel free to call the clinic you have any questions or concerns. The clinic phone number is [phone].   I have been informed and understand all the instructions given to me. I know to contact the clinic, my physician, or go to the Emergency Department if any problems should occur. I do not have any questions at this time, but understand that I may call the clinic during office hours   should I have any questions or need assistance in obtaining follow up care.    __________________________________________  _____________  __________ Signature of Patient or Authorized Representative            Date                   Time    __________________________________________ Nurse's Signature    

## 2012-08-27 NOTE — Patient Instructions (Signed)
Your lab work showed stable disease. Continue current treatment with Velcade, Revlimid and Decadron. Followup in 3 weeks. I will refer you to Dr. Marissa Calamity at [facility] for second opinion.

## 2012-08-27 NOTE — Telephone Encounter (Signed)
Faxed revlimid refill to [facility]

## 2012-08-29 ENCOUNTER — Ambulatory Visit (INDEPENDENT_AMBULATORY_CARE_PROVIDER_SITE_OTHER): Payer: Medicare Other | Admitting: Gastroenterology

## 2012-08-29 ENCOUNTER — Encounter: Payer: Self-pay | Admitting: Gastroenterology

## 2012-08-29 VITALS — BP 122/60 | HR 80 | Ht 69.25 in | Wt 200.0 lb

## 2012-08-29 DIAGNOSIS — Z860100 Personal history of colon polyps, unspecified: Secondary | ICD-10-CM

## 2012-08-29 DIAGNOSIS — K219 Gastro-esophageal reflux disease without esophagitis: Secondary | ICD-10-CM

## 2012-08-29 MED ORDER — PANTOPRAZOLE SODIUM 40 MG PO TBEC: 40.0000 mg | DELAYED_RELEASE_TABLET | Freq: Two times a day (BID) | ORAL | Status: DC

## 2012-08-29 MED ORDER — PANTOPRAZOLE SODIUM 40 MG PO TBEC: 40.0000 mg | DELAYED_RELEASE_TABLET | Freq: Every day | ORAL | Status: DC

## 2012-08-29 NOTE — Patient Instructions (Addendum)
We have sent the following medications to your pharmacy for you to pick up at your convenience: Protonix. Also have sent a 90 day supply to your mail order pharmacy.   You will be due for a recall colonoscopy in 11/2013. We will send you a reminder in the mail when it gets closer to that time.  Thank you for choosing me and [facility].  Venita Lick. Pleas Koch., MD., Clementeen Graham  cc: Joselyn Arrow, MD

## 2012-08-29 NOTE — Progress Notes (Signed)
History of Present Illness: This is a 72 year old male was diagnosed with multiple myeloma in 2013 and has had a chemotherapy regimen. I reviewed his recent oncology notes. He notes an increase in reflux symptoms since beginning chemotherapy. He has had periods of diarrhea and constipation associated with certain chemotherapy medications that he is managing adequately with over-the-counter medications. He states eventually he will undergo an autologous bone marrow transplant. He is due for colonoscopy in May and would like to postpone this exam if possible.  Current Medications, Allergies, Past Medical History, Past Surgical History, Family History and Social History were reviewed in [employer] record.  Physical Exam: General: Well developed , well nourished, no acute distress Head: Normocephalic and atraumatic Eyes:  sclerae anicteric, EOMI Ears: Normal auditory acuity Mouth: No deformity or lesions Lungs: Clear throughout to auscultation Heart: Regular rate and rhythm; no murmurs, rubs or bruits Abdomen: Soft, non tender and non distended. No masses, hepatosplenomegaly or hernias noted. Normal Bowel sounds Musculoskeletal: Symmetrical with no gross deformities  Pulses:  Normal pulses noted Extremities: No clubbing, cyanosis, edema or deformities noted Neurological: Alert oriented x 4, grossly nonfocal Psychological:  Alert and cooperative. Normal mood and affect  Assessment and Recommendations:  1. GERD. Continue antireflux measures and increase pantoprazole 40 mg twice daily. The symptoms are not adequately controlled on this regimen is advised to contact us  2. Personal history of adenomatous colon polyps initially diagnosed in 1996. Surveillance colonoscopy recommended May 2014 however given his ongoing chemotherapy and plans for an autologous bone marrow transplant we will postpone this until May 2015. He can certainly call sooner if he decides to proceed prior to  May 2015.

## 2012-08-30 ENCOUNTER — Ambulatory Visit (HOSPITAL_BASED_OUTPATIENT_CLINIC_OR_DEPARTMENT_OTHER): Payer: Medicare Other

## 2012-08-30 ENCOUNTER — Other Ambulatory Visit: Payer: Medicare Other | Admitting: Lab

## 2012-08-30 VITALS — BP 123/56 | HR 70 | Temp 98.0°F

## 2012-08-30 DIAGNOSIS — Z5112 Encounter for antineoplastic immunotherapy: Secondary | ICD-10-CM

## 2012-08-30 DIAGNOSIS — C9000 Multiple myeloma not having achieved remission: Secondary | ICD-10-CM

## 2012-08-30 LAB — COMPREHENSIVE METABOLIC PANEL (CC13)
Albumin: 2.1 g/dL — ABNORMAL LOW (ref 3.5–5.0)
Alkaline Phosphatase: 55 U/L (ref 40–150)
BUN: 8.7 mg/dL (ref 7.0–26.0)
CO2: 27 mEq/L (ref 22–29)
Calcium: 8.9 mg/dL (ref 8.4–10.4)
Chloride: 105 mEq/L (ref 98–107)
Glucose: 110 mg/dl — ABNORMAL HIGH (ref 70–99)
Potassium: 3.8 mEq/L (ref 3.5–5.1)
Total Protein: 10.6 g/dL — ABNORMAL HIGH (ref 6.4–8.3)

## 2012-08-30 LAB — CBC WITH DIFFERENTIAL/PLATELET
Basophils Absolute: 0.2 10*3/uL — ABNORMAL HIGH (ref 0.0–0.1)
Eosinophils Absolute: 0.1 10*3/uL (ref 0.0–0.5)
HGB: 9.7 g/dL — ABNORMAL LOW (ref 13.0–17.1)
MCV: 87.9 fL (ref 79.3–98.0)
MONO#: 0.5 10*3/uL (ref 0.1–0.9)
MONO%: 10.5 % (ref 0.0–14.0)
NEUT#: 2.6 10*3/uL (ref 1.5–6.5)
Platelets: 320 10*3/uL (ref 140–400)
RDW: 18.4 % — ABNORMAL HIGH (ref 11.0–14.6)
WBC: 4.4 10*3/uL (ref 4.0–10.3)

## 2012-08-30 MED ORDER — ONDANSETRON HCL 8 MG PO TABS
8.0000 mg | ORAL_TABLET | Freq: Once | ORAL | Status: AC
  Administered 2012-08-30: 8 mg via ORAL

## 2012-08-30 MED ORDER — BORTEZOMIB CHEMO SQ INJECTION 3.5 MG (2.5MG/ML)
1.3000 mg/m2 | Freq: Once | INTRAMUSCULAR | Status: AC
  Administered 2012-08-30: 2.75 mg via SUBCUTANEOUS
  Filled 2012-08-30: qty 2.75

## 2012-08-30 NOTE — Patient Instructions (Signed)
[facility]   CHEMOTHERAPY INSTRUCTIONS Take at home meds as ordered and call office for any problems or questions.   SELF CARE ACTIVITIES WHILE ON CHEMOTHERAPY: As tolerated   SYMPTOMS TO REPORT AS SOON AS POSSIBLE AFTER TREATMENT:  FEVER GREATER THAN 100.5 F  CHILLS WITH OR WITHOUT FEVER  NAUSEA AND VOMITING THAT IS NOT CONTROLLED WITH YOUR NAUSEA MEDICATION  UNUSUAL SHORTNESS OF BREATH  UNUSUAL BRUISING OR BLEEDING  TENDERNESS IN MOUTH AND THROAT WITH OR WITHOUT PRESENCE OF ULCERS  URINARY PROBLEMS  BOWEL PROBLEMS  UNUSUAL RASH   Let us know if there is anything that we can do to make your therapy better!      I have been informed and understand all of the instructions given to me and have received a copy. I have been instructed to call the clinic [phone]  or my family physician as soon as possible for continued medical care, if indicated. I do not have any more questions at this time but understand that I may call the Cancer Center at [phone] during office hours should I have questions or need assistance in obtaining follow-up care.

## 2012-08-30 NOTE — Progress Notes (Signed)
1025am: RN spoke with Dr Shirline Frees regarding pt's inquiry regarding the need for having labs drawn before every velcade (2xw) or if it was possible to return to the previous plan of 1xw prior to Velcade.  Dr Shirline Frees verbalized labs may be drawn once a week prior to velcade on Mondays.  RN notified pt and pt verbalized understanding.

## 2012-08-31 ENCOUNTER — Other Ambulatory Visit: Payer: Self-pay | Admitting: Certified Registered Nurse Anesthetist

## 2012-09-03 ENCOUNTER — Other Ambulatory Visit (HOSPITAL_BASED_OUTPATIENT_CLINIC_OR_DEPARTMENT_OTHER): Payer: Medicare Other

## 2012-09-03 ENCOUNTER — Ambulatory Visit (HOSPITAL_BASED_OUTPATIENT_CLINIC_OR_DEPARTMENT_OTHER): Payer: Medicare Other

## 2012-09-03 VITALS — BP 133/68 | HR 70 | Temp 97.7°F | Resp 17

## 2012-09-03 DIAGNOSIS — Z5112 Encounter for antineoplastic immunotherapy: Secondary | ICD-10-CM

## 2012-09-03 DIAGNOSIS — C9000 Multiple myeloma not having achieved remission: Secondary | ICD-10-CM

## 2012-09-03 LAB — CBC WITH DIFFERENTIAL/PLATELET
Basophils Absolute: 0 10*3/uL (ref 0.0–0.1)
Eosinophils Absolute: 0 10*3/uL (ref 0.0–0.5)
HGB: 9.5 g/dL — ABNORMAL LOW (ref 13.0–17.1)
MONO#: 0.1 10*3/uL (ref 0.1–0.9)
NEUT#: 4.4 10*3/uL (ref 1.5–6.5)
RBC: 3.23 10*6/uL — ABNORMAL LOW (ref 4.20–5.82)
RDW: 18.6 % — ABNORMAL HIGH (ref 11.0–14.6)
WBC: 5.2 10*3/uL (ref 4.0–10.3)
lymph#: 0.6 10*3/uL — ABNORMAL LOW (ref 0.9–3.3)

## 2012-09-03 LAB — COMPREHENSIVE METABOLIC PANEL (CC13)
ALT: 12 U/L (ref 0–55)
Albumin: 2.2 g/dL — ABNORMAL LOW (ref 3.5–5.0)
Alkaline Phosphatase: 54 U/L (ref 40–150)
CO2: 27 mEq/L (ref 22–29)
Potassium: 3.9 mEq/L (ref 3.5–5.1)
Sodium: 134 mEq/L — ABNORMAL LOW (ref 136–145)
Total Bilirubin: 0.3 mg/dL (ref 0.20–1.20)
Total Protein: 10.8 g/dL — ABNORMAL HIGH (ref 6.4–8.3)

## 2012-09-03 MED ORDER — BORTEZOMIB CHEMO SQ INJECTION 3.5 MG (2.5MG/ML)
1.3000 mg/m2 | Freq: Once | INTRAMUSCULAR | Status: AC
  Administered 2012-09-03: 2.75 mg via SUBCUTANEOUS
  Filled 2012-09-03: qty 2.75

## 2012-09-03 MED ORDER — ONDANSETRON HCL 8 MG PO TABS
8.0000 mg | ORAL_TABLET | Freq: Once | ORAL | Status: AC
  Administered 2012-09-03: 8 mg via ORAL

## 2012-09-03 NOTE — Patient Instructions (Addendum)
[facility] Discharge Instructions for Patients Receiving Chemotherapy  Today you received the following chemotherapy agents; Velcade.  To help prevent nausea and vomiting after your treatment, we encourage you to take your nausea medication ; Compazine as directed.  If you develop nausea and vomiting that is not controlled by your nausea medication, call the clinic. If it is after clinic hours your family physician or the after hours number for the clinic or go to the Emergency Department.   BELOW ARE SYMPTOMS THAT SHOULD BE REPORTED IMMEDIATELY:  *FEVER GREATER THAN 100.5 F  *CHILLS WITH OR WITHOUT FEVER  NAUSEA AND VOMITING THAT IS NOT CONTROLLED WITH YOUR NAUSEA MEDICATION  *UNUSUAL SHORTNESS OF BREATH  *UNUSUAL BRUISING OR BLEEDING  TENDERNESS IN MOUTH AND THROAT WITH OR WITHOUT PRESENCE OF ULCERS  *URINARY PROBLEMS  *BOWEL PROBLEMS  UNUSUAL RASH Items with * indicate a potential emergency and should be followed up as soon as possible.   Feel free to call the clinic you have any questions or concerns. The clinic phone number is [phone].   I have been informed and understand all the instructions given to me. I know to contact the clinic, my physician, or go to the Emergency Department if any problems should occur. I do not have any questions at this time, but understand that I may call the clinic during office hours   should I have any questions or need assistance in obtaining follow up care.    __________________________________________  _____________  __________ Signature of Patient or Authorized Representative            Date                   Time    __________________________________________ Nurse's Signature

## 2012-09-06 ENCOUNTER — Other Ambulatory Visit: Payer: Medicare Other | Admitting: Lab

## 2012-09-06 ENCOUNTER — Ambulatory Visit (HOSPITAL_BASED_OUTPATIENT_CLINIC_OR_DEPARTMENT_OTHER): Payer: Medicare Other

## 2012-09-06 VITALS — BP 130/66 | HR 79 | Temp 97.0°F

## 2012-09-06 DIAGNOSIS — C9000 Multiple myeloma not having achieved remission: Secondary | ICD-10-CM

## 2012-09-06 DIAGNOSIS — Z5112 Encounter for antineoplastic immunotherapy: Secondary | ICD-10-CM

## 2012-09-06 MED ORDER — BORTEZOMIB CHEMO SQ INJECTION 3.5 MG (2.5MG/ML)
1.3000 mg/m2 | Freq: Once | INTRAMUSCULAR | Status: AC
  Administered 2012-09-06: 2.75 mg via SUBCUTANEOUS
  Filled 2012-09-06: qty 2.75

## 2012-09-06 MED ORDER — ONDANSETRON HCL 8 MG PO TABS
8.0000 mg | ORAL_TABLET | Freq: Once | ORAL | Status: AC
  Administered 2012-09-06: 8 mg via ORAL

## 2012-09-06 NOTE — Patient Instructions (Signed)
[facility] Discharge Instructions for Patients Receiving Chemotherapy  Today you received the following chemotherapy agents velcade  To help prevent nausea and vomiting after your treatment, we encourage you to take your nausea medication compazine Begin taking it tonight and take it as often as prescribed..   If you develop nausea and vomiting that is not controlled by your nausea medication, call the clinic. If it is after clinic hours your family physician or the after hours number for the clinic or go to the Emergency Department.   BELOW ARE SYMPTOMS THAT SHOULD BE REPORTED IMMEDIATELY:  *FEVER GREATER THAN 100.5 F  *CHILLS WITH OR WITHOUT FEVER  NAUSEA AND VOMITING THAT IS NOT CONTROLLED WITH YOUR NAUSEA MEDICATION  *UNUSUAL SHORTNESS OF BREATH  *UNUSUAL BRUISING OR BLEEDING  TENDERNESS IN MOUTH AND THROAT WITH OR WITHOUT PRESENCE OF ULCERS  *URINARY PROBLEMS  *BOWEL PROBLEMS  UNUSUAL RASH Items with * indicate a potential emergency and should be followed up as soon as possible.   Feel free to call the clinic you have any questions or concerns. The clinic phone number is [phone].   I have been informed and understand all the instructions given to me. I know to contact the clinic, my physician, or go to the Emergency Department if any problems should occur. I do not have any questions at this time, but understand that I may call the clinic during office hours   should I have any questions or need assistance in obtaining follow up care.    __________________________________________  _____________  __________ Signature of Patient or Authorized Representative            Date                   Time    __________________________________________ Nurse's Signature

## 2012-09-06 NOTE — Progress Notes (Signed)
Pt. Refused AVS...just got one on the 24th.

## 2012-09-17 ENCOUNTER — Other Ambulatory Visit (HOSPITAL_BASED_OUTPATIENT_CLINIC_OR_DEPARTMENT_OTHER): Payer: Medicare Other

## 2012-09-17 ENCOUNTER — Ambulatory Visit (HOSPITAL_BASED_OUTPATIENT_CLINIC_OR_DEPARTMENT_OTHER): Payer: Medicare Other

## 2012-09-17 ENCOUNTER — Ambulatory Visit (HOSPITAL_BASED_OUTPATIENT_CLINIC_OR_DEPARTMENT_OTHER): Payer: Medicare Other | Admitting: Physician Assistant

## 2012-09-17 VITALS — BP 132/70 | HR 83 | Temp 97.9°F | Resp 18 | Ht 69.25 in | Wt 200.7 lb

## 2012-09-17 DIAGNOSIS — C9000 Multiple myeloma not having achieved remission: Secondary | ICD-10-CM

## 2012-09-17 DIAGNOSIS — Z5112 Encounter for antineoplastic immunotherapy: Secondary | ICD-10-CM

## 2012-09-17 LAB — CBC WITH DIFFERENTIAL/PLATELET
BASO%: 2.4 % — ABNORMAL HIGH (ref 0.0–2.0)
EOS%: 0.2 % (ref 0.0–7.0)
LYMPH%: 11.2 % — ABNORMAL LOW (ref 14.0–49.0)
MCHC: 33.4 g/dL (ref 32.0–36.0)
MCV: 87.4 fL (ref 79.3–98.0)
MONO%: 1.7 % (ref 0.0–14.0)
Platelets: 413 10*3/uL — ABNORMAL HIGH (ref 140–400)
RBC: 3.47 10*6/uL — ABNORMAL LOW (ref 4.20–5.82)
RDW: 18.7 % — ABNORMAL HIGH (ref 11.0–14.6)

## 2012-09-17 LAB — COMPREHENSIVE METABOLIC PANEL (CC13)
ALT: 23 U/L (ref 0–55)
AST: 25 U/L (ref 5–34)
Alkaline Phosphatase: 64 U/L (ref 40–150)
Potassium: 3.7 mEq/L (ref 3.5–5.1)
Sodium: 136 mEq/L (ref 136–145)
Total Bilirubin: 0.37 mg/dL (ref 0.20–1.20)
Total Protein: 10.7 g/dL — ABNORMAL HIGH (ref 6.4–8.3)

## 2012-09-17 MED ORDER — BORTEZOMIB CHEMO SQ INJECTION 3.5 MG (2.5MG/ML)
1.3000 mg/m2 | Freq: Once | INTRAMUSCULAR | Status: AC
  Administered 2012-09-17: 2.75 mg via SUBCUTANEOUS
  Filled 2012-09-17: qty 2.75

## 2012-09-17 MED ORDER — TEMAZEPAM 30 MG PO CAPS: 30.0000 mg | ORAL_CAPSULE | Freq: Every day | ORAL | Status: DC

## 2012-09-17 MED ORDER — ONDANSETRON HCL 8 MG PO TABS
8.0000 mg | ORAL_TABLET | Freq: Once | ORAL | Status: AC
  Administered 2012-09-17: 8 mg via ORAL

## 2012-09-17 MED ORDER — ZOLEDRONIC ACID 4 MG/100ML IV SOLN
4.0000 mg | Freq: Once | INTRAVENOUS | Status: AC
  Administered 2012-09-17: 4 mg via INTRAVENOUS
  Filled 2012-09-17: qty 100

## 2012-09-17 NOTE — Patient Instructions (Addendum)
Continue with your Velcade as scheduled, we will skip 09/24/2012 to accommodate your trip to [place]. Followup in 3 weeks Keep the appointment in [facility]  With Dr. Jorene Guest regarding his opinion about a change in therapy and its possible effect on future bone marrow transplant

## 2012-09-17 NOTE — Patient Instructions (Addendum)
[facility] Discharge Instructions for Patients Receiving Chemotherapy  Today you received the following chemotherapy agents Velcade and Zometa.  To help prevent nausea and vomiting after your treatment, we encourage you to take your nausea medication as prescribed. .   If you develop nausea and vomiting that is not controlled by your nausea medication, call the clinic. If it is after clinic hours your family physician or the after hours number for the clinic or go to the Emergency Department.   BELOW ARE SYMPTOMS THAT SHOULD BE REPORTED IMMEDIATELY:  *FEVER GREATER THAN 100.5 F  *CHILLS WITH OR WITHOUT FEVER  NAUSEA AND VOMITING THAT IS NOT CONTROLLED WITH YOUR NAUSEA MEDICATION  *UNUSUAL SHORTNESS OF BREATH  *UNUSUAL BRUISING OR BLEEDING  TENDERNESS IN MOUTH AND THROAT WITH OR WITHOUT PRESENCE OF ULCERS  *URINARY PROBLEMS  *BOWEL PROBLEMS  UNUSUAL RASH Items with * indicate a potential emergency and should be followed up as soon as possible.  One of the nurses will contact you 24 hours after your treatment. Please let the nurse know about any problems that you may have experienced. Feel free to call the clinic you have any questions or concerns. The clinic phone number is [phone].    I know to contact the clinic, my physician, or go to the Emergency Department if any problems should occur. I do not have any questions at this time, but understand that I may call the clinic during office hours   should I have any questions or need assistance in obtaining follow up care.    __________________________________________  _____________  __________ Signature of Patient or Authorized Representative            Date                   Time    __________________________________________ Nurse's Signature

## 2012-09-18 ENCOUNTER — Other Ambulatory Visit: Payer: Self-pay | Admitting: Medical Oncology

## 2012-09-18 ENCOUNTER — Telehealth: Payer: Self-pay | Admitting: Gastroenterology

## 2012-09-18 MED ORDER — PANTOPRAZOLE SODIUM 40 MG PO TBEC: 40.0000 mg | DELAYED_RELEASE_TABLET | Freq: Two times a day (BID) | ORAL | Status: DC

## 2012-09-18 NOTE — Telephone Encounter (Signed)
Informed patient that I corrected the prescription to say twice daily and sent a 90 day supply to [facility]. Pt verbalized understanding.

## 2012-09-18 NOTE — Telephone Encounter (Signed)
Sent revlimid refill to dr Arbutus Ped for signature

## 2012-09-19 ENCOUNTER — Telehealth: Payer: Self-pay | Admitting: Internal Medicine

## 2012-09-19 NOTE — Telephone Encounter (Signed)
Pt appt. With Dr Marissa Calamity is 10/09/12@9 :00. Faxed medical records. Left pt vm to return call.

## 2012-09-20 ENCOUNTER — Encounter: Payer: Self-pay | Admitting: Family Medicine

## 2012-09-20 ENCOUNTER — Ambulatory Visit: Payer: Medicare Other | Admitting: Family Medicine

## 2012-09-20 ENCOUNTER — Ambulatory Visit (HOSPITAL_BASED_OUTPATIENT_CLINIC_OR_DEPARTMENT_OTHER): Payer: Medicare Other

## 2012-09-20 ENCOUNTER — Other Ambulatory Visit: Payer: Medicare Other | Admitting: Lab

## 2012-09-20 VITALS — BP 112/70 | HR 68 | Temp 98.2°F | Ht 70.0 in | Wt 200.0 lb

## 2012-09-20 VITALS — BP 112/67 | HR 64 | Temp 97.6°F | Resp 18

## 2012-09-20 DIAGNOSIS — Z5112 Encounter for antineoplastic immunotherapy: Secondary | ICD-10-CM

## 2012-09-20 DIAGNOSIS — C9000 Multiple myeloma not having achieved remission: Secondary | ICD-10-CM

## 2012-09-20 DIAGNOSIS — J069 Acute upper respiratory infection, unspecified: Secondary | ICD-10-CM

## 2012-09-20 MED ORDER — ONDANSETRON HCL 8 MG PO TABS
8.0000 mg | ORAL_TABLET | Freq: Once | ORAL | Status: AC
  Administered 2012-09-20: 8 mg via ORAL

## 2012-09-20 MED ORDER — BORTEZOMIB CHEMO SQ INJECTION 3.5 MG (2.5MG/ML)
1.3000 mg/m2 | Freq: Once | INTRAMUSCULAR | Status: AC
  Administered 2012-09-20: 2.75 mg via SUBCUTANEOUS
  Filled 2012-09-20: qty 2.75

## 2012-09-20 NOTE — Patient Instructions (Addendum)
[facility] Discharge Instructions for Patients Receiving Chemotherapy  Today you received the following chemotherapy agents Velcade.  To help prevent nausea and vomiting after your treatment, we encourage you to take your nausea medication as prescribed.    If you develop nausea and vomiting that is not controlled by your nausea medication, call the clinic. If it is after clinic hours your family physician or the after hours number for the clinic or go to the Emergency Department.   BELOW ARE SYMPTOMS THAT SHOULD BE REPORTED IMMEDIATELY:  *FEVER GREATER THAN 100.5 F  *CHILLS WITH OR WITHOUT FEVER  NAUSEA AND VOMITING THAT IS NOT CONTROLLED WITH YOUR NAUSEA MEDICATION  *UNUSUAL SHORTNESS OF BREATH  *UNUSUAL BRUISING OR BLEEDING  TENDERNESS IN MOUTH AND THROAT WITH OR WITHOUT PRESENCE OF ULCERS  *URINARY PROBLEMS  *BOWEL PROBLEMS  UNUSUAL RASH Items with * indicate a potential emergency and should be followed up as soon as possible.  Please let the nurse know about any problems that you may have experienced. Feel free to call the clinic you have any questions or concerns. The clinic phone number is [phone].   I have been informed and understand all the instructions given to me. I know to contact the clinic, my physician, or go to the Emergency Department if any problems should occur. I do not have any questions at this time, but understand that I may call the clinic during office hours   should I have any questions or need assistance in obtaining follow up care.    __________________________________________  _____________  __________ Signature of Patient or Authorized Representative            Date                   Time    __________________________________________ Nurse's Signature    

## 2012-09-20 NOTE — Progress Notes (Signed)
[facility] [phone]   [phone]  OFFICE PROGRESS NOTE  [name],[name], MD [address] [place] [address]  DIAGNOSIS: Stage IIA multiple myeloma diagnosed in July of 2013   PRIOR THERAPY: systemic chemotherapy with subcutaneous Velcade 1.3 mg/M2 on days 1, 4, 8 and 11 every 3 weeks in addition to Revlimid 25 mg by mouth daily for 14 days every 3 weeks and dexamethasone 40 mg on a weekly basis. Status post 4 planned cycles   CURRENT THERAPY:  1. Systemic chemotherapy with subcutaneous Velcade 1.3 mg/M2 on days 1, 4, 8 and 11 every 3 weeks in addition to Revlimid 25 mg by mouth daily for 14 days every 3 weeks and dexamethasone 40 mg on a weekly basis. 4 more cycles are planned and he is status post 4 cycles of the second set of 4 cycles 2. Prophylactic dose Coumadin at 2 mg by mouth daily 3. Zometa 4 mg IV given a monthly basis for bone disease  INTERVAL HISTORY: [name] 72 y.o. male returns to the clinic today for followup visit. The patient is feeling fine today with no specific complaints. He is tolerating his systemic chemotherapy with subcutaneous Velcade, Revlimid and Decadron fairly well with no significant adverse effects except for mild fatigue. He denied having any aching pain. He is also tolerating his treatment with Zometa fairly well and he continues on calcium supplements. He requests a refill for his Restoril. He also indicates that he will be in [place] between March 17 and March 20 and will need his schedule adjusted accordingly. He has not as yet been seen at [facility] regarding his multiple myeloma and other treatment choices that would not jeopardize a future stem cell transplant. He is due for his Zometa infusion today as well.  MEDICAL HISTORY: Past Medical History  Diagnosis Date  . GERD (gastroesophageal reflux disease)   . Adenomatous polyp of colon 1996  . Arthritis     Osteoarthritis right knee,  spine, wrist  . Benign prostatic hypertrophy     (Dr. Vernie Ammons)  . History of chronic prostatitis   . History of melanoma     Back (Dr. Karlyn Agee); early melanoma R arm 03/2011  . Diverticulosis   . Arthritis of hand, right     Right thumb (Dr. Teressa Senter)  . Degenerative disc disease   . Hemorrhoids   . Erosive esophagitis   . Back ache     r/o Multiple Myeloma  . Multiple myeloma 2013    ALLERGIES:  is allergic to penicillins.  MEDICATIONS:  Current Outpatient Prescriptions  Medication Sig Dispense Refill  . ascorbic acid (VITAMIN C) 500 MG tablet Take 500 mg by mouth daily.      Marland Kitchen aspirin EC 81 MG tablet Take 81 mg by mouth every other day.      . B Complex-C (B-COMPLEX WITH VITAMIN C) tablet Take 1 tablet by mouth daily.      . bortezomib IV (VELCADE) 3.5 MG injection Inject 1.3 mg/m2 into the vein 2 (two) times a week.      . CELEBREX 200 MG capsule Take 200 mg by mouth daily.       . cholecalciferol (VITAMIN D) 1000 UNITS tablet Take 1,000 Units by mouth daily.      Marland Kitchen dexamethasone (DECADRON) 4 MG tablet Take 10 tablets (40 mg total) by mouth once a week. Takes on Monday am.  120 tablet  0  . diphenhydrAMINE (  BENADRYL) 25 MG tablet Take 50 mg by mouth every 6 (six) hours as needed. Itching      . famciclovir (FAMVIR) 500 MG tablet Take 1 tablet (500 mg total) by mouth 3 (three) times daily.  21 tablet  0  . fish oil-omega-3 fatty acids 1000 MG capsule Take 1 g by mouth daily.       Marland Kitchen glucosamine-chondroitin 500-400 MG tablet Take 1 tablet by mouth 3 (three) times daily.      Marland Kitchen HYDROcodone-acetaminophen (NORCO) 7.5-325 MG per tablet Take 1 tablet by mouth every 6 (six) hours as needed. Pain      . Ibuprofen (IBU-200 PO) Take 3 tablets by mouth daily as needed.      Marland Kitchen lenalidomide (REVLIMID) 25 MG capsule Take 1 capsule (25 mg total) by mouth daily. X 14 days every 3 weeks  14 capsule  0  . methocarbamol (ROBAXIN) 500 MG tablet Take 500 mg by mouth 3 (three) times daily as needed.  Muscle spasm      . Multiple Vitamin (MULTIVITAMIN) capsule Take 1 capsule by mouth daily.      Marland Kitchen oxyCODONE (OXY IR/ROXICODONE) 5 MG immediate release tablet Take 5 mg by mouth every 8 (eight) hours as needed. Pain      . pantoprazole (PROTONIX) 40 MG tablet Take 1 tablet (40 mg total) by mouth 2 (two) times daily.  180 tablet  3  . potassium chloride SA (K-DUR,KLOR-CON) 20 MEQ tablet Take 20 mEq by mouth daily.      . Probiotic Product (ALIGN) 4 MG CAPS Take 1 capsule by mouth daily.      . prochlorperazine (COMPAZINE) 10 MG tablet Take 1 tablet (10 mg total) by mouth every 6 (six) hours as needed. Nausea  30 tablet  1  . temazepam (RESTORIL) 30 MG capsule Take 1 capsule (30 mg total) by mouth at bedtime.  30 capsule  0  . warfarin (COUMADIN) 2 MG tablet Take 1 tablet (2 mg total) by mouth daily.  30 tablet  2  . zolendronic acid (ZOMETA) 4 MG/5ML injection Inject 4 mg into the vein every 28 (twenty-eight) days.       No current facility-administered medications for this visit.   Facility-Administered Medications Ordered in Other Visits  Medication Dose Route Frequency Provider Last Rate Last Dose  . ondansetron (ZOFRAN) tablet 8 mg  8 mg Oral Once Si Gaul, MD        SURGICAL HISTORY:  Past Surgical History  Procedure Laterality Date  . Total knee replaced  2000    Left knee  . Appendectomy    . Colonoscopy  2009  . Esophagogastroduodenoscopy  2009    mild inflammation at esophagogastric junction  . Excision of melanoma      back ( x3)  . Great toe surgery      bilateral  . Inguinal hernia repair      left, x 2  . Total knee arthroplasty  06/08/2011    Procedure: TOTAL KNEE ARTHROPLASTY;  Surgeon: Loreta Ave, MD;  Location: [facility] OR;  Service: Orthopedics;  Laterality: Right;  osteonics    REVIEW OF SYSTEMS:  A comprehensive review of systems was negative.   PHYSICAL EXAMINATION: General appearance: alert, cooperative and no distress Head: Normocephalic, without  obvious abnormality, atraumatic Neck: no adenopathy Lymph nodes: Cervical, supraclavicular, and axillary nodes normal. Resp: clear to auscultation bilaterally Cardio: regular rate and rhythm, S1, S2 normal, no murmur, click, rub or gallop GI: soft, non-tender; bowel  sounds normal; no masses,  no organomegaly Genitalia: defer exam Extremities: edema no edema Neurologic: Alert and oriented X 3, normal strength and tone. Normal symmetric reflexes. Normal coordination and gait  ECOG PERFORMANCE STATUS: 0 - Asymptomatic  Blood pressure 132/70, pulse 83, temperature 97.9 F (36.6 C), temperature source Oral, resp. rate 18, height 5' 9.25" (1.759 m), weight 200 lb 11.2 oz (91.037 kg).  LABORATORY DATA: Lab Results  Component Value Date   WBC 4.7 09/17/2012   HGB 10.1* 09/17/2012   HCT 30.4* 09/17/2012   MCV 87.4 09/17/2012   PLT 413* 09/17/2012      Chemistry      Component Value Date/Time   NA 136 09/17/2012 1314   NA 124* 02/20/2012 1317   K 3.7 09/17/2012 1314   K 3.9 02/20/2012 1317   CL 107 09/17/2012 1314   CL 101 02/20/2012 1317   CO2 26 09/17/2012 1314   CO2 24 02/20/2012 1317   BUN 10.4 09/17/2012 1314   BUN 9 02/20/2012 1317   CREATININE 1.1 09/17/2012 1314   CREATININE 0.92 02/20/2012 1317   CREATININE 0.98 01/18/2012 0934      Component Value Date/Time   CALCIUM 8.6 09/17/2012 1314   CALCIUM 7.7* 02/20/2012 1317   ALKPHOS 64 09/17/2012 1314   ALKPHOS 48 02/20/2012 1317   AST 25 09/17/2012 1314   AST 38* 02/20/2012 1317   ALT 23 09/17/2012 1314   ALT 33 02/20/2012 1317   BILITOT 0.37 09/17/2012 1314   BILITOT 0.2* 02/20/2012 1317     Other lab results: The 2 microglobulin 3.61, free kappa light chain 1.8, free lambda light chain 84.70, kappa/on that ratio 0.02, IgG 7570, IgA 46 and IgM 35. Total protein 10.6.   RADIOGRAPHIC STUDIES: No results found.  ASSESSMENT/PLAN: This is a very pleasant 72 years old white male with history of multiple myeloma currently on systemic  chemotherapy with Velcade, Revlimid and Decadron status post 8 cycles but unfortunately the patient continues to have persistent disease. He has no evidence for progression but also no significant improvement in his disease. The patient was discussed with Dr. Arbutus Ped. We have again made a referral to Dr. Marissa Calamity regarding the patient's multiple myeloma and his opinion on a change in therapy with Velcade, Doxil and Decadron versus one of the new agents like,pomalyst or Carfilzomib without jeopardizing the possibility for a stem cell transplant in the future. We will cancel his 317 Velcade subcutaneous injection to accommodate his trip to [place]. He will return for his injection that is scheduled for 09/27/2012. He'll followup in 3 weeks for another symptom management visit. We will plan to keep him on his current therapy of Velcade Revlimid and dexamethasone until we hear back from Dr. Marissa Calamity.  [name], Hector E, PA-C   He was advised to call immediately if he has any concerning symptoms in the interval.  All questions were answered. The patient knows to call the clinic with any problems, questions or concerns. We can certainly see the patient much sooner if necessary.  I spent 15 minutes counseling the patient face to face. The total time spent in the appointment was 25 minutes.

## 2012-09-20 NOTE — Progress Notes (Signed)
Chief Complaint  Patient presents with  . URI    splitting [place] x 3 days-flying Saturday and would like to have this resolved.   Going to [facility] in 2 days.  He wants to be better before flying. Started about a week ago with sinus headaches, runny nose.  Mucus has been clear, and runny like water.  About 4-5 days ago he had discolored mucus, while at the [facility]. This discolored mucus has cleared up, now just runny and clear.  Denies postnasal drip. Headaches are frontal, and have gotten worse.  Has tried excedrin, but not effective.  Has also taken Mucinex DM, but headaches continue. He has not taken other OTC medications, and hasn't tried sinus rinses. Denies cough, chest pain.  He was treated with a z-pak about a month ago for a "major cold".  He seemed to get a lot better with the Z-pak, but he doesn't think the sinuses got "completely knocked out".  Does feel like overall he got better after the illness, with resolution of congestion, headaches.  He reports that Dr. Shirline Frees is not pleased with progress of multiple myeloma treatment.  Protein down from 16 to 10, but needs to be down to 2 before transplant.  Referred to Dr. Marissa Calamity at [facility], has appt April 1.  Walking an hour daily, able to do the hills at the [place] better than before. Overall doing quite well without complaints. Had some recurrent back pain, but that improved after last dose of zometa  Past Medical History  Diagnosis Date  . GERD (gastroesophageal reflux disease)   . Adenomatous polyp of colon 1996  . Arthritis     Osteoarthritis right knee, spine, wrist  . Benign prostatic hypertrophy     (Dr. Vernie Ammons)  . History of chronic prostatitis   . History of melanoma     Back (Dr. Karlyn Agee); early melanoma R arm 03/2011  . Diverticulosis   . Arthritis of hand, right     Right thumb (Dr. Teressa Senter)  . Degenerative disc disease   . Hemorrhoids   . Erosive esophagitis   . Back ache     r/o Multiple Myeloma  . Multiple myeloma  2013   Past Surgical History  Procedure Laterality Date  . Total knee replaced  2000    Left knee  . Appendectomy    . Colonoscopy  2009  . Esophagogastroduodenoscopy  2009    mild inflammation at esophagogastric junction  . Excision of melanoma      back ( x3)  . Great toe surgery      bilateral  . Inguinal hernia repair      left, x 2  . Total knee arthroplasty  06/08/2011    Procedure: TOTAL KNEE ARTHROPLASTY;  Surgeon: Loreta Ave, MD;  Location: [facility];  Service: Orthopedics;  Laterality: Right;  osteonics   History   Social History  . Marital Status: Married    Spouse Name: N/A    Number of Children: 2  . Years of Education: N/A   Occupational History  . mortgage banking retired    Social History Main Topics  . Smoking status: Former Smoker -- 3.00 packs/day for 10 years    Types: Cigarettes    Quit date: 07/11/1976  . Smokeless tobacco: Never Used     Comment: 3 PPD x 6 years  . Alcohol Use: 1.5 oz/week    3 drink(s) per week     Comment: 2 glasses of wine daily  .  Drug Use: No  . Sexually Active: Yes -- Male partner(s)   Other Topics Concern  . Not on file   Social History Narrative  . No narrative on file   Current Outpatient Prescriptions on File Prior to Visit  Medication Sig Dispense Refill  . ascorbic acid (VITAMIN C) 500 MG tablet Take 500 mg by mouth daily.      Marland Kitchen aspirin EC 81 MG tablet Take 81 mg by mouth every other day.      . B Complex-C (B-COMPLEX WITH VITAMIN C) tablet Take 1 tablet by mouth daily.      . bortezomib IV (VELCADE) 3.5 MG injection Inject 1.3 mg/m2 into the vein 2 (two) times a week.      . CELEBREX 200 MG capsule Take 200 mg by mouth daily.       . cholecalciferol (VITAMIN D) 1000 UNITS tablet Take 1,000 Units by mouth daily.      Marland Kitchen dexamethasone (DECADRON) 4 MG tablet Take 10 tablets (40 mg total) by mouth once a week. Takes on Monday am.  120 tablet  0  . fish oil-omega-3 fatty acids 1000 MG capsule Take 1 g by mouth  daily.       Marland Kitchen glucosamine-chondroitin 500-400 MG tablet Take 1 tablet by mouth 3 (three) times daily.      Marland Kitchen HYDROcodone-acetaminophen (NORCO) 7.5-325 MG per tablet Take 1 tablet by mouth every 6 (six) hours as needed. Pain      . lenalidomide (REVLIMID) 25 MG capsule Take 1 capsule (25 mg total) by mouth daily. X 14 days every 3 weeks  14 capsule  0  . methocarbamol (ROBAXIN) 500 MG tablet Take 500 mg by mouth 3 (three) times daily as needed. Muscle spasm      . Multiple Vitamin (MULTIVITAMIN) capsule Take 1 capsule by mouth daily.      Marland Kitchen oxyCODONE (OXY IR/ROXICODONE) 5 MG immediate release tablet Take 5 mg by mouth every 8 (eight) hours as needed. Pain      . pantoprazole (PROTONIX) 40 MG tablet Take 1 tablet (40 mg total) by mouth 2 (two) times daily.  180 tablet  3  . potassium chloride SA (K-DUR,KLOR-CON) 20 MEQ tablet Take 20 mEq by mouth daily.      . Probiotic Product (ALIGN) 4 MG CAPS Take 1 capsule by mouth daily.      . prochlorperazine (COMPAZINE) 10 MG tablet Take 1 tablet (10 mg total) by mouth every 6 (six) hours as needed. Nausea  30 tablet  1  . temazepam (RESTORIL) 30 MG capsule Take 1 capsule (30 mg total) by mouth at bedtime.  30 capsule  0  . warfarin (COUMADIN) 2 MG tablet Take 1 tablet (2 mg total) by mouth daily.  30 tablet  2  . zolendronic acid (ZOMETA) 4 MG/5ML injection Inject 4 mg into the vein every 28 (twenty-eight) days.      . diphenhydrAMINE (BENADRYL) 25 MG tablet Take 50 mg by mouth every 6 (six) hours as needed. Itching      . famciclovir (FAMVIR) 500 MG tablet Take 1 tablet (500 mg total) by mouth 3 (three) times daily.  21 tablet  0  . Ibuprofen (IBU-200 PO) Take 3 tablets by mouth daily as needed.       Current Facility-Administered Medications on File Prior to Visit  Medication Dose Route Frequency Provider Last Rate Last Dose  . ondansetron (ZOFRAN) tablet 8 mg  8 mg Oral Once Si Gaul, MD  meds reviewed--on low dose coumadin to prevent blood  clots from the revlimid  Allergies  Allergen Reactions  . Penicillins     REACTION: rash   ROS: Denies fevers, bleeding, bruising, nausea, vomiting, diarrhea.  +constipation from the chemo Had labs Monday of this week--not fasting.  Takes steroids every Monday (sugar was elevated that day). See HPI  PHYSICAL EXAM:  BP 112/70  Pulse 68  Temp(Src) 98.2 F (36.8 C) (Oral)  Ht 5\' 10"  (1.778 m)  Wt 200 lb (90.719 kg)  BMI 28.7 kg/m2 Well developed, pleasant male in no distress.  Frequent throat-clearing HEENT:  PERRL, EOMI, conjunctiva clear.  TM's and EAC's normal.  Nasal mucosa moderately edematous and pale/blue L>R, with no purulence.  Sinuses nontender.  OP clear.   Neck: no lymphadenopathy or mass Heart: regular rate and rhythm without murmur Lungs: clear bilaterally Skin: no rash  Lab Results  Component Value Date   WBC 4.7 09/17/2012   HGB 10.1* 09/17/2012   HCT 30.4* 09/17/2012   MCV 87.4 09/17/2012   PLT 413* 09/17/2012     Chemistry      Component Value Date/Time   NA 136 09/17/2012 1314   NA 124* 02/20/2012 1317   K 3.7 09/17/2012 1314   K 3.9 02/20/2012 1317   CL 107 09/17/2012 1314   CL 101 02/20/2012 1317   CO2 26 09/17/2012 1314   CO2 24 02/20/2012 1317   BUN 10.4 09/17/2012 1314   BUN 9 02/20/2012 1317   CREATININE 1.1 09/17/2012 1314   CREATININE 0.92 02/20/2012 1317   CREATININE 0.98 01/18/2012 0934      Component Value Date/Time   CALCIUM 8.6 09/17/2012 1314   CALCIUM 7.7* 02/20/2012 1317   ALKPHOS 64 09/17/2012 1314   ALKPHOS 48 02/20/2012 1317   AST 25 09/17/2012 1314   AST 38* 02/20/2012 1317   ALT 23 09/17/2012 1314   ALT 33 02/20/2012 1317   BILITOT 0.37 09/17/2012 1314   BILITOT 0.2* 02/20/2012 1317     Glu 172  ASSESSMENT/PLAN:  Sinus headache  Acute upper respiratory infections of unspecified site  Multiple myeloma  Use an antihistamine (claritin or zyrtec)--will treat allergies and dry up the runny nose. You can use decongestant (sudafed) to help  with sinus headaches (use this separately, or get the D version of the antihistamines) Continue Mucinex (plain) as expectorant to loosen up any thick phlegm. Consider sinus rinses/neti-pot  Call if you develop fever, discolored mucus, clear evidence of sinus infection in order for antibiotic to be called in for you Discussed lack of evidence of bacterial infection, and reasons to hold off on ABX at this point. Pt verbalizes understanding.  F/u as scheduled for 2nd opinion regarding treatment of multiple myeloma  Labs reviewed with pt--reassured regarding elevated blood sugar earlier in the week; continue to monitor periodically.  25 min visit, more than 1/2 counseling

## 2012-09-20 NOTE — Patient Instructions (Signed)
Use an antihistamine (claritin or zyrtec)--will treat allergies and dry up the runny nose. You can use decongestant (sudafed) to help with sinus headaches (use this separately, or get the D version of the antihistamines) Continue Mucinex (plain) as expectorant to loosen up any thick phlegm. Consider sinus rinses/neti-pot  Call if you develop fever, discolored mucus, clear evidence of sinus infection in order for antibiotic to be called in for you

## 2012-09-27 ENCOUNTER — Ambulatory Visit (HOSPITAL_BASED_OUTPATIENT_CLINIC_OR_DEPARTMENT_OTHER): Payer: Medicare Other

## 2012-09-27 ENCOUNTER — Other Ambulatory Visit (HOSPITAL_BASED_OUTPATIENT_CLINIC_OR_DEPARTMENT_OTHER): Payer: Medicare Other | Admitting: Lab

## 2012-09-27 VITALS — BP 114/63 | HR 77 | Temp 97.9°F | Resp 20

## 2012-09-27 DIAGNOSIS — C9000 Multiple myeloma not having achieved remission: Secondary | ICD-10-CM

## 2012-09-27 DIAGNOSIS — Z5112 Encounter for antineoplastic immunotherapy: Secondary | ICD-10-CM

## 2012-09-27 LAB — COMPREHENSIVE METABOLIC PANEL (CC13)
AST: 20 U/L (ref 5–34)
BUN: 8.1 mg/dL (ref 7.0–26.0)
Calcium: 9.2 mg/dL (ref 8.4–10.4)
Chloride: 103 mEq/L (ref 98–107)
Creatinine: 0.9 mg/dL (ref 0.7–1.3)
Total Bilirubin: 0.43 mg/dL (ref 0.20–1.20)

## 2012-09-27 LAB — CBC WITH DIFFERENTIAL/PLATELET
Eosinophils Absolute: 0.2 10*3/uL (ref 0.0–0.5)
HCT: 30.2 % — ABNORMAL LOW (ref 38.4–49.9)
LYMPH%: 15.8 % (ref 14.0–49.0)
MONO#: 0.8 10*3/uL (ref 0.1–0.9)
NEUT#: 4.2 10*3/uL (ref 1.5–6.5)
NEUT%: 67.8 % (ref 39.0–75.0)
Platelets: 281 10*3/uL (ref 140–400)
WBC: 6.2 10*3/uL (ref 4.0–10.3)

## 2012-09-27 MED ORDER — ONDANSETRON HCL 8 MG PO TABS
8.0000 mg | ORAL_TABLET | Freq: Once | ORAL | Status: AC
  Administered 2012-09-27: 8 mg via ORAL

## 2012-09-27 MED ORDER — BORTEZOMIB CHEMO SQ INJECTION 3.5 MG (2.5MG/ML)
1.3000 mg/m2 | Freq: Once | INTRAMUSCULAR | Status: AC
  Administered 2012-09-27: 2.75 mg via SUBCUTANEOUS
  Filled 2012-09-27: qty 2.75

## 2012-09-27 NOTE — Patient Instructions (Signed)
[facility] Discharge Instructions for Patients Receiving Chemotherapy  Today you received the following chemotherapy agents Velcade.  To help prevent nausea and vomiting after your treatment, we encourage you to take your nausea medication as prescribed.   If you develop nausea and vomiting that is not controlled by your nausea medication, call the clinic. If it is after clinic hours your family physician or the after hours number for the clinic or go to the Emergency Department.   BELOW ARE SYMPTOMS THAT SHOULD BE REPORTED IMMEDIATELY:  *FEVER GREATER THAN 100.5 F  *CHILLS WITH OR WITHOUT FEVER  NAUSEA AND VOMITING THAT IS NOT CONTROLLED WITH YOUR NAUSEA MEDICATION  *UNUSUAL SHORTNESS OF BREATH  *UNUSUAL BRUISING OR BLEEDING  TENDERNESS IN MOUTH AND THROAT WITH OR WITHOUT PRESENCE OF ULCERS  *URINARY PROBLEMS  *BOWEL PROBLEMS  UNUSUAL RASH Items with * indicate a potential emergency and should be followed up as soon as possible.  One of the nurses will contact you 24 hours after your treatment. Please let the nurse know about any problems that you may have experienced. Feel free to call the clinic you have any questions or concerns. The clinic phone number is [phone].   I have been informed and understand all the instructions given to me. I know to contact the clinic, my physician, or go to the Emergency Department if any problems should occur. I do not have any questions at this time, but understand that I may call the clinic during office hours   should I have any questions or need assistance in obtaining follow up care.    __________________________________________  _____________  __________ Signature of Patient or Authorized Representative            Date                   Time    __________________________________________ Nurse's Signature    

## 2012-10-01 ENCOUNTER — Other Ambulatory Visit: Payer: Medicare Other | Admitting: Lab

## 2012-10-04 ENCOUNTER — Telehealth: Payer: Self-pay | Admitting: Gastroenterology

## 2012-10-04 MED ORDER — PANTOPRAZOLE SODIUM 40 MG PO TBEC: 40.0000 mg | DELAYED_RELEASE_TABLET | Freq: Two times a day (BID) | ORAL | Status: DC

## 2012-10-04 NOTE — Telephone Encounter (Signed)
rx sent

## 2012-10-08 ENCOUNTER — Other Ambulatory Visit: Payer: Self-pay | Admitting: Medical Oncology

## 2012-10-08 ENCOUNTER — Ambulatory Visit (HOSPITAL_BASED_OUTPATIENT_CLINIC_OR_DEPARTMENT_OTHER): Payer: Medicare Other | Admitting: Physician Assistant

## 2012-10-08 ENCOUNTER — Encounter: Payer: Self-pay | Admitting: Physician Assistant

## 2012-10-08 ENCOUNTER — Other Ambulatory Visit (HOSPITAL_BASED_OUTPATIENT_CLINIC_OR_DEPARTMENT_OTHER): Payer: Medicare Other | Admitting: Lab

## 2012-10-08 ENCOUNTER — Ambulatory Visit (HOSPITAL_BASED_OUTPATIENT_CLINIC_OR_DEPARTMENT_OTHER): Payer: Medicare Other

## 2012-10-08 VITALS — BP 126/62 | HR 67 | Temp 97.6°F | Resp 18 | Ht 70.0 in | Wt 196.2 lb

## 2012-10-08 DIAGNOSIS — C9000 Multiple myeloma not having achieved remission: Secondary | ICD-10-CM

## 2012-10-08 DIAGNOSIS — Z5112 Encounter for antineoplastic immunotherapy: Secondary | ICD-10-CM

## 2012-10-08 LAB — CBC WITH DIFFERENTIAL/PLATELET
Basophils Absolute: 0.1 10*3/uL (ref 0.0–0.1)
EOS%: 2.2 % (ref 0.0–7.0)
HCT: 32.9 % — ABNORMAL LOW (ref 38.4–49.9)
HGB: 10.8 g/dL — ABNORMAL LOW (ref 13.0–17.1)
MCH: 28.7 pg (ref 27.2–33.4)
MONO#: 0.2 10*3/uL (ref 0.1–0.9)
NEUT%: 66.2 % (ref 39.0–75.0)
Platelets: 324 10*3/uL (ref 140–400)
lymph#: 0.8 10*3/uL — ABNORMAL LOW (ref 0.9–3.3)

## 2012-10-08 LAB — COMPREHENSIVE METABOLIC PANEL (CC13)
BUN: 7.7 mg/dL (ref 7.0–26.0)
CO2: 27 mEq/L (ref 22–29)
Calcium: 9 mg/dL (ref 8.4–10.4)
Chloride: 108 mEq/L — ABNORMAL HIGH (ref 98–107)
Creatinine: 1 mg/dL (ref 0.7–1.3)

## 2012-10-08 MED ORDER — LENALIDOMIDE 25 MG PO CAPS: 25.0000 mg | ORAL_CAPSULE | Freq: Every day | ORAL | Status: DC

## 2012-10-08 MED ORDER — BORTEZOMIB CHEMO SQ INJECTION 3.5 MG (2.5MG/ML)
1.3000 mg/m2 | Freq: Once | INTRAMUSCULAR | Status: AC
  Administered 2012-10-08: 2.75 mg via SUBCUTANEOUS
  Filled 2012-10-08: qty 2.75

## 2012-10-08 MED ORDER — ONDANSETRON HCL 8 MG PO TABS
8.0000 mg | ORAL_TABLET | Freq: Once | ORAL | Status: AC
  Administered 2012-10-08: 8 mg via ORAL

## 2012-10-08 NOTE — Patient Instructions (Signed)
[facility] Discharge Instructions for Patients Receiving Chemotherapy  Today you received the following chemotherapy agents Velcade.  To help prevent nausea and vomiting after your treatment, we encourage you to take your nausea medication.   If you develop nausea and vomiting that is not controlled by your nausea medication, call the clinic. If it is after clinic hours your family physician or the after hours number for the clinic or go to the Emergency Department.   BELOW ARE SYMPTOMS THAT SHOULD BE REPORTED IMMEDIATELY:  *FEVER GREATER THAN 100.5 F  *CHILLS WITH OR WITHOUT FEVER  NAUSEA AND VOMITING THAT IS NOT CONTROLLED WITH YOUR NAUSEA MEDICATION  *UNUSUAL SHORTNESS OF BREATH  *UNUSUAL BRUISING OR BLEEDING  TENDERNESS IN MOUTH AND THROAT WITH OR WITHOUT PRESENCE OF ULCERS  *URINARY PROBLEMS  *BOWEL PROBLEMS  UNUSUAL RASH Items with * indicate a potential emergency and should be followed up as soon as possible.  One of the nurses will contact you 24 hours after your treatment. Please let the nurse know about any problems that you may have experienced. Feel free to call the clinic you have any questions or concerns. The clinic phone number is [phone].   I have been informed and understand all the instructions given to me. I know to contact the clinic, my physician, or go to the Emergency Department if any problems should occur. I do not have any questions at this time, but understand that I may call the clinic during office hours   should I have any questions or need assistance in obtaining follow up care.    __________________________________________  _____________  __________ Signature of Patient or Authorized Representative            Date                   Time    __________________________________________ Nurse's Signature    

## 2012-10-08 NOTE — Telephone Encounter (Signed)
Faxed refill for revlimid to [facility]

## 2012-10-08 NOTE — Patient Instructions (Addendum)
Continue labs and chemotherapy as scheduled Keep your appointment and [facility] as scheduled 10/09/2012 for your second opinion regarding her multiple myeloma Followup with Dr. Arbutus Ped in 2 weeks for a symptom management visit and to discuss the results of your second opinion from [facility]

## 2012-10-08 NOTE — Progress Notes (Signed)
[facility] [phone]   [phone]  OFFICE PROGRESS NOTE  [name],Hector A, MD [address] [place] [address]  DIAGNOSIS: Stage IIA multiple myeloma diagnosed in July of 2013   PRIOR THERAPY: systemic chemotherapy with subcutaneous Velcade 1.3 mg/M2 on days 1, 4, 8 and 11 every 3 weeks in addition to Revlimid 25 mg by mouth daily for 14 days every 3 weeks and dexamethasone 40 mg on [name] weekly basis. Status post 4 planned cycles   CURRENT THERAPY:  1. Systemic chemotherapy with subcutaneous Velcade 1.3 mg/M2 on days 1, 4, 8 and 11 every 3 weeks in addition to Revlimid 25 mg by mouth daily for 14 days every 3 weeks and dexamethasone 40 mg on [name] weekly basis. 4 more cycles are planned and he is status post 4 cycles of the second set of 4 cycles, now status post [name] total of 10 cycles 2. Prophylactic dose Coumadin at 2 mg by mouth daily 3. Zometa 4 mg IV given [name] monthly basis for bone disease  INTERVAL HISTORY: [name] 72 y.o. male returns to the clinic today for followup visit. The patient is feeling fine today with no specific complaints. He is tolerating his systemic chemotherapy with subcutaneous Velcade, Revlimid and Decadron fairly well with no significant adverse effects except for mild fatigue. He denied having any aching pain. He is also tolerating his treatment with Zometa fairly well and he continues on calcium supplements. He reports he is scheduled for his second opinion with Dr. Marissa Calamity in [place] on 10/09/2012 regarding his multiple myeloma and other treatment choices that would not jeopardize [name] future stem cell transplant.Marland Kitchen He will be due for his next Zometa infusion on 10/15/2012. He enjoyed history to [place] but states it was cold.   MEDICAL HISTORY: Past Medical History  Diagnosis Date  . GERD (gastroesophageal reflux disease)   . Adenomatous polyp of colon 1996  . Arthritis     Osteoarthritis right knee, spine,  wrist  . Benign prostatic hypertrophy     (Dr. Vernie Ammons)  . History of chronic prostatitis   . History of melanoma     Back (Dr. Karlyn Agee); early melanoma R arm 03/2011  . Diverticulosis   . Arthritis of hand, right     Right thumb (Dr. Teressa Senter)  . Degenerative disc disease   . Hemorrhoids   . Erosive esophagitis   . Back ache     r/o Multiple Myeloma  . Multiple myeloma 2013    ALLERGIES:  is allergic to penicillins.  MEDICATIONS:  Current Outpatient Prescriptions  Medication Sig Dispense Refill  . ascorbic acid (VITAMIN C) 500 MG tablet Take 500 mg by mouth daily.      Marland Kitchen aspirin EC 81 MG tablet Take 81 mg by mouth every other day.      . B Complex-C (B-COMPLEX WITH VITAMIN C) tablet Take 1 tablet by mouth daily.      . bortezomib IV (VELCADE) 3.5 MG injection Inject 1.3 mg/m2 into the vein 2 (two) times [name] week.      . CELEBREX 200 MG capsule Take 200 mg by mouth daily.       . cholecalciferol (VITAMIN D) 1000 UNITS tablet Take 1,000 Units by mouth daily.      Marland Kitchen dexamethasone (DECADRON) 4 MG tablet Take 10 tablets (40 mg total) by mouth once [name] week. Takes on Monday am.  120 tablet  0  . diphenhydrAMINE (BENADRYL) 25  MG tablet Take 50 mg by mouth every 6 (six) hours as needed. Itching      . famciclovir (FAMVIR) 500 MG tablet Take 1 tablet (500 mg total) by mouth 3 (three) times daily.  21 tablet  0  . fish oil-omega-3 fatty acids 1000 MG capsule Take 1 g by mouth daily.       Marland Kitchen glucosamine-chondroitin 500-400 MG tablet Take 1 tablet by mouth 3 (three) times daily.      Marland Kitchen HYDROcodone-acetaminophen (NORCO) 7.5-325 MG per tablet Take 1 tablet by mouth every 6 (six) hours as needed. Pain      . Ibuprofen (IBU-200 PO) Take 3 tablets by mouth daily as needed.      Marland Kitchen lenalidomide (REVLIMID) 25 MG capsule Take 1 capsule (25 mg total) by mouth daily. X 14 days every 3 weeks  14 capsule  0  . methocarbamol (ROBAXIN) 500 MG tablet Take 500 mg by mouth 3 (three) times daily as needed. Muscle  spasm      . Multiple Vitamin (MULTIVITAMIN) capsule Take 1 capsule by mouth daily.      Marland Kitchen oxyCODONE (OXY IR/ROXICODONE) 5 MG immediate release tablet Take 5 mg by mouth every 8 (eight) hours as needed. Pain      . pantoprazole (PROTONIX) 40 MG tablet Take 1 tablet (40 mg total) by mouth 2 (two) times daily.  60 tablet  0  . potassium chloride SA (K-DUR,KLOR-CON) 20 MEQ tablet Take 20 mEq by mouth daily.      . Probiotic Product (ALIGN) 4 MG CAPS Take 1 capsule by mouth daily.      . prochlorperazine (COMPAZINE) 10 MG tablet Take 1 tablet (10 mg total) by mouth every 6 (six) hours as needed. Nausea  30 tablet  1  . temazepam (RESTORIL) 30 MG capsule Take 1 capsule (30 mg total) by mouth at bedtime.  30 capsule  0  . warfarin (COUMADIN) 2 MG tablet Take 1 tablet (2 mg total) by mouth daily.  30 tablet  2  . zolendronic acid (ZOMETA) 4 MG/5ML injection Inject 4 mg into the vein every 28 (twenty-eight) days.       No current facility-administered medications for this visit.   Facility-Administered Medications Ordered in Other Visits  Medication Dose Route Frequency Provider Last Rate Last Dose  . ondansetron (ZOFRAN) tablet 8 mg  8 mg Oral Once Si Gaul, MD        SURGICAL HISTORY:  Past Surgical History  Procedure Laterality Date  . Total knee replaced  2000    Left knee  . Appendectomy    . Colonoscopy  2009  . Esophagogastroduodenoscopy  2009    mild inflammation at esophagogastric junction  . Excision of melanoma      back ( x3)  . Great toe surgery      bilateral  . Inguinal hernia repair      left, x 2  . Total knee arthroplasty  06/08/2011    Procedure: TOTAL KNEE ARTHROPLASTY;  Surgeon: Loreta Ave, MD;  Location: [facility] OR;  Service: Orthopedics;  Laterality: Right;  osteonics    REVIEW OF SYSTEMS:  [name] comprehensive review of systems was negative.   PHYSICAL EXAMINATION: General appearance: alert, cooperative and no distress Head: Normocephalic, without obvious  abnormality, atraumatic Neck: no adenopathy Lymph nodes: Cervical, supraclavicular, and axillary nodes normal. Resp: clear to auscultation bilaterally Cardio: regular rate and rhythm, S1, S2 normal, no murmur, click, rub or gallop GI: soft, non-tender; bowel sounds normal;  no masses,  no organomegaly Genitalia: defer exam Extremities: edema no edema Neurologic: Alert and oriented X 3, normal strength and tone. Normal symmetric reflexes. Normal coordination and gait  ECOG PERFORMANCE STATUS: 1 - Symptomatic but completely ambulatory  Blood pressure 126/62, pulse 67, temperature 97.6 F (36.4 C), temperature source Oral, resp. rate 18, height 5\' 10"  (1.778 m), weight 196 lb 3.2 oz (88.996 kg).  LABORATORY DATA: Lab Results  Component Value Date   WBC 3.7* 10/08/2012   HGB 10.8* 10/08/2012   HCT 32.9* 10/08/2012   MCV 87.6 10/08/2012   PLT 324 10/08/2012      Chemistry      Component Value Date/Time   NA 139 10/08/2012 0847   NA 124* 02/20/2012 1317   K 4.0 10/08/2012 0847   K 3.9 02/20/2012 1317   CL 108* 10/08/2012 0847   CL 101 02/20/2012 1317   CO2 27 10/08/2012 0847   CO2 24 02/20/2012 1317   BUN 7.7 10/08/2012 0847   BUN 9 02/20/2012 1317   CREATININE 1.0 10/08/2012 0847   CREATININE 0.92 02/20/2012 1317   CREATININE 0.98 01/18/2012 0934      Component Value Date/Time   CALCIUM 9.0 10/08/2012 0847   CALCIUM 7.7* 02/20/2012 1317   ALKPHOS 64 10/08/2012 0847   ALKPHOS 48 02/20/2012 1317   AST 17 10/08/2012 0847   AST 38* 02/20/2012 1317   ALT 15 10/08/2012 0847   ALT 33 02/20/2012 1317   BILITOT 0.28 10/08/2012 0847   BILITOT 0.2* 02/20/2012 1317     Other lab results: The 2 microglobulin 3.61, free kappa light chain 1.8, free lambda light chain 84.70, kappa/on that ratio 0.02, IgG 7570, IgA 46 and IgM 35. Total protein 10.6.   RADIOGRAPHIC STUDIES: No results found.  ASSESSMENT/PLAN: This is [name] very pleasant 72 years old white male with history of multiple myeloma currently on  systemic chemotherapy with Velcade, Revlimid and Decadron status post 8 cycles but unfortunately the patient continues to have persistent disease. He has no evidence for progression but also no significant improvement in his disease. The patient was discussed with Dr. Arbutus Ped. He is to see  Dr. Marissa Calamity regarding the patient's multiple myeloma and his opinion on [name] change in therapy with Velcade, Doxil and Decadron versus one of the new agents like,pomalyst or Carfilzomib without jeopardizing the possibility for [name] stem cell transplant in the future. He'll follow up with Dr. Arbutus Ped  in 2 weeks for another symptom management visit and to discuss the results of his visit to Dr. Marissa Calamity.. We will plan to keep him on his current therapy of Velcade Revlimid and dexamethasone until we hear back from Dr. Marissa Calamity. He will be due for his next infusion of Zometa on 10/15/2012.  Hector Guymon E, PA-C   He was advised to call immediately if he has any concerning symptoms in the interval.  All questions were answered. The patient knows to call the clinic with any problems, questions or concerns. We can certainly see the patient much sooner if necessary.  I spent 20 minutes counseling the patient face to face. The total time spent in the appointment was 30 minutes.

## 2012-10-09 ENCOUNTER — Telehealth: Payer: Self-pay | Admitting: Medical Oncology

## 2012-10-09 HISTORY — PX: OTHER SURGICAL HISTORY: SHX169

## 2012-10-09 NOTE — Telephone Encounter (Signed)
Received call and  fax from Dr Dorothea Ogle. Betha Loa called to report that Dr Marella Chimes has new tx recommendations and she will fax. Fax given to Dr Arbutus Ped.

## 2012-10-10 ENCOUNTER — Telehealth: Payer: Self-pay | Admitting: *Deleted

## 2012-10-10 DIAGNOSIS — C9000 Multiple myeloma not having achieved remission: Secondary | ICD-10-CM

## 2012-10-10 NOTE — Telephone Encounter (Signed)
Per note from Dr Dorothea Ogle, pt needs to d/c revlimid and velcade and being new tx ASAP with kyprolis, cytoxan, decadron.  Pt needs ECHO, EKG, pro-BNP, and troponin level drawn prior to tx.  Per dr Donnald Garre, schedule pt for f/u and have all testing done prior to seeing Dr Donnald Garre. Pt is aware to not come to velcade appt tomorrow and to stop taking revlimid.  Onc tx schedule filled out.  SLJ

## 2012-10-11 ENCOUNTER — Ambulatory Visit: Payer: Medicare Other

## 2012-10-11 ENCOUNTER — Telehealth: Payer: Self-pay | Admitting: *Deleted

## 2012-10-11 NOTE — Telephone Encounter (Signed)
Per staff message and POF I have scheduled appts.  JMW  

## 2012-10-12 ENCOUNTER — Other Ambulatory Visit (HOSPITAL_COMMUNITY): Payer: Medicare Other

## 2012-10-12 ENCOUNTER — Telehealth: Payer: Self-pay | Admitting: *Deleted

## 2012-10-12 ENCOUNTER — Other Ambulatory Visit (HOSPITAL_BASED_OUTPATIENT_CLINIC_OR_DEPARTMENT_OTHER): Payer: Medicare Other | Admitting: Lab

## 2012-10-12 ENCOUNTER — Ambulatory Visit (HOSPITAL_COMMUNITY)
Admission: RE | Admit: 2012-10-12 | Discharge: 2012-10-12 | Disposition: A | Discharge status: Home / Self Care | Payer: Medicare Other | Source: Ambulatory Visit | Attending: Internal Medicine | Admitting: Internal Medicine

## 2012-10-12 DIAGNOSIS — C9000 Multiple myeloma not having achieved remission: Secondary | ICD-10-CM

## 2012-10-12 DIAGNOSIS — I080 Rheumatic disorders of both mitral and aortic valves: Secondary | ICD-10-CM | POA: Insufficient documentation

## 2012-10-12 DIAGNOSIS — I517 Cardiomegaly: Secondary | ICD-10-CM

## 2012-10-12 DIAGNOSIS — Z01818 Encounter for other preprocedural examination: Secondary | ICD-10-CM | POA: Insufficient documentation

## 2012-10-12 LAB — CBC WITH DIFFERENTIAL/PLATELET
BASO%: 0.4 % (ref 0.0–2.0)
EOS%: 3.9 % (ref 0.0–7.0)
MCH: 28.3 pg (ref 27.2–33.4)
MCHC: 32.7 g/dL (ref 32.0–36.0)
RBC: 3.54 10*6/uL — ABNORMAL LOW (ref 4.20–5.82)
RDW: 20.1 % — ABNORMAL HIGH (ref 11.0–14.6)
lymph#: 0.8 10*3/uL — ABNORMAL LOW (ref 0.9–3.3)

## 2012-10-12 LAB — BRAIN NATRIURETIC PEPTIDE: Brain Natriuretic Peptide: 48.5 pg/mL (ref 0.0–100.0)

## 2012-10-12 LAB — COMPREHENSIVE METABOLIC PANEL (CC13)
ALT: 13 U/L (ref 0–55)
Alkaline Phosphatase: 61 U/L (ref 40–150)
Glucose: 132 mg/dl — ABNORMAL HIGH (ref 70–99)
Sodium: 133 mEq/L — ABNORMAL LOW (ref 136–145)
Total Bilirubin: 0.45 mg/dL (ref 0.20–1.20)
Total Protein: 9.9 g/dL — ABNORMAL HIGH (ref 6.4–8.3)

## 2012-10-12 LAB — TROPONIN I: Troponin I: <0.01 ng/mL (ref <0.06)

## 2012-10-12 NOTE — Progress Notes (Signed)
Echocardiogram 2D Echocardiogram has been performed.  Gem Conkle 10/12/2012, 12:07 PM

## 2012-10-12 NOTE — Telephone Encounter (Signed)
Received call from the lab stating that pt's BNP and troponin levels will not be covered by insurance under the multiple myeloma diagnosis.  Pt signed form that he is aware he may be financially responsible for lab work drawn.  Called Betha Loa, RN with Dr. Dorothea Ogle at [facility] and let them know the lab work  Dr. Marissa Calamity ordered will not be covered.  SLJ

## 2012-10-15 ENCOUNTER — Other Ambulatory Visit: Payer: Medicare Other

## 2012-10-15 ENCOUNTER — Other Ambulatory Visit: Payer: Medicare Other | Admitting: Lab

## 2012-10-15 ENCOUNTER — Ambulatory Visit: Payer: Medicare Other

## 2012-10-17 ENCOUNTER — Ambulatory Visit (HOSPITAL_BASED_OUTPATIENT_CLINIC_OR_DEPARTMENT_OTHER): Payer: Medicare Other

## 2012-10-17 ENCOUNTER — Ambulatory Visit (HOSPITAL_BASED_OUTPATIENT_CLINIC_OR_DEPARTMENT_OTHER): Payer: Medicare Other | Admitting: Internal Medicine

## 2012-10-17 ENCOUNTER — Encounter: Payer: Self-pay | Admitting: Internal Medicine

## 2012-10-17 VITALS — BP 115/70 | HR 63 | Temp 97.8°F | Resp 18 | Ht 70.0 in | Wt 201.7 lb

## 2012-10-17 DIAGNOSIS — C9000 Multiple myeloma not having achieved remission: Secondary | ICD-10-CM

## 2012-10-17 MED ORDER — TEMAZEPAM 30 MG PO CAPS: 30.0000 mg | ORAL_CAPSULE | Freq: Every day | ORAL | Status: DC

## 2012-10-17 MED ORDER — ACYCLOVIR 400 MG PO TABS: 400.0000 mg | ORAL_TABLET | ORAL | Status: DC

## 2012-10-17 MED ORDER — DEXAMETHASONE 4 MG PO TABS: 40.0000 mg | ORAL_TABLET | ORAL | Status: DC

## 2012-10-17 MED ORDER — ZOLEDRONIC ACID 4 MG/100ML IV SOLN
4.0000 mg | Freq: Once | INTRAVENOUS | Status: AC
  Administered 2012-10-17: 4 mg via INTRAVENOUS
  Filled 2012-10-17: qty 100

## 2012-10-17 NOTE — Patient Instructions (Signed)
We discussed treatment options with Carfilzomib, Cytoxan and Decadron. First cycle next week. Followup visit in 2 weeks

## 2012-10-17 NOTE — Progress Notes (Signed)
[facility] Telephone:([phone]   [phone]  OFFICE PROGRESS NOTE  [name],Hector A, MD [address] [place] [address]  DIAGNOSIS: Stage IIA multiple myeloma diagnosed in July of 2013   PRIOR THERAPY:  1) Systemic chemotherapy with subcutaneous Velcade 1.3 mg/M2 on days 1, 4, 8 and 11 every 3 weeks in addition to Revlimid 25 mg by mouth daily for 14 days every 3 weeks and dexamethasone 40 mg on [name] weekly basis. Status post 4 planned cycles. 2) Systemic chemotherapy with subcutaneous Velcade 1.3 mg/M2 on days 1, 4, 8 and 11 every 3 weeks in addition to Revlimid 25 mg by mouth daily for 14 days every 3 weeks and dexamethasone 40 mg on [name] weekly basis. 4 more cycles are planned and he is status post 4 cycles of the second set of 4 cycles, now status post [name] total of 10 cycles   CURRENT THERAPY:  1. Systemic chemotherapy with Carfilzomib 36 mg/M2 on days 1, 2, 8, 9, 15 and 16 in addition to cyclophosphamide 300 mg/M2 IV on days 1, 8 and 15 as well as dexamethasone on days 1, 8, 15 and 22 every 4 weeks.  2. Zometa 4 mg IV given [name] monthly basis for bone disease   INTERVAL HISTORY: [name] 72 y.o. male returns to the clinic today for followup visit accompanied his wife. The patient is feeling fine today with no specific complaints. He denied having any significant chest pain, shortness breath, cough or hemoptysis. He denied having any weight loss or night sweats. The patient denied having any significant fatigue weakness. He was referred to Dr. Marissa Calamity at [facility] for [name] second opinion regarding his multiple myeloma and he recommended for the patient and treatment with Kyprolis, Cytoxan and dexamethasone. The patient is here today for evaluation and consideration of starting his systemic therapy. He underwent 2-D echo which showed ejection fraction of 50-55%. He also has an EKG performed earlier today that showed sinus bradycardia with  first-degree AV block.   MEDICAL HISTORY: Past Medical History  Diagnosis Date  . GERD (gastroesophageal reflux disease)   . Adenomatous polyp of colon 1996  . Arthritis     Osteoarthritis right knee, spine, wrist  . Benign prostatic hypertrophy     (Dr. Vernie Ammons)  . History of chronic prostatitis   . History of melanoma     Back (Dr. Karlyn Agee); early melanoma R arm 03/2011  . Diverticulosis   . Arthritis of hand, right     Right thumb (Dr. Teressa Senter)  . Degenerative disc disease   . Hemorrhoids   . Erosive esophagitis   . Back ache     r/o Multiple Myeloma  . Multiple myeloma 2013    ALLERGIES:  is allergic to penicillins.  MEDICATIONS:  Current Outpatient Prescriptions  Medication Sig Dispense Refill  . ascorbic acid (VITAMIN C) 500 MG tablet Take 500 mg by mouth daily.      Marland Kitchen aspirin EC 81 MG tablet Take 81 mg by mouth every other day.      . B Complex-C (B-COMPLEX WITH VITAMIN C) tablet Take 1 tablet by mouth daily.      . bortezomib IV (VELCADE) 3.5 MG injection Inject 1.3 mg/m2 into the vein 2 (two) times [name] week.      . CELEBREX 200 MG capsule Take 200 mg by mouth daily.       . cholecalciferol (VITAMIN D) 1000 UNITS tablet Take 1,000 Units  by mouth daily.      Marland Kitchen dexamethasone (DECADRON) 4 MG tablet Take 10 tablets (40 mg total) by mouth once [name] week. Takes on Monday am.  120 tablet  0  . diphenhydrAMINE (BENADRYL) 25 MG tablet Take 50 mg by mouth every 6 (six) hours as needed. Itching      . famciclovir (FAMVIR) 500 MG tablet Take 1 tablet (500 mg total) by mouth 3 (three) times daily.  21 tablet  0  . fish oil-omega-3 fatty acids 1000 MG capsule Take 1 g by mouth daily.       Marland Kitchen glucosamine-chondroitin 500-400 MG tablet Take 1 tablet by mouth 3 (three) times daily.      Marland Kitchen HYDROcodone-acetaminophen (NORCO) 7.5-325 MG per tablet Take 1 tablet by mouth every 6 (six) hours as needed. Pain      . Ibuprofen (IBU-200 PO) Take 3 tablets by mouth daily as needed.      Marland Kitchen  lenalidomide (REVLIMID) 25 MG capsule Take 1 capsule (25 mg total) by mouth daily. X 14 days every 3 weeks  14 capsule  0  . methocarbamol (ROBAXIN) 500 MG tablet Take 500 mg by mouth 3 (three) times daily as needed. Muscle spasm      . Multiple Vitamin (MULTIVITAMIN) capsule Take 1 capsule by mouth daily.      Marland Kitchen oxyCODONE (OXY IR/ROXICODONE) 5 MG immediate release tablet Take 5 mg by mouth every 8 (eight) hours as needed. Pain      . pantoprazole (PROTONIX) 40 MG tablet Take 1 tablet (40 mg total) by mouth 2 (two) times daily.  60 tablet  0  . potassium chloride SA (K-DUR,KLOR-CON) 20 MEQ tablet Take 20 mEq by mouth daily.      . Probiotic Product (ALIGN) 4 MG CAPS Take 1 capsule by mouth daily.      . prochlorperazine (COMPAZINE) 10 MG tablet Take 1 tablet (10 mg total) by mouth every 6 (six) hours as needed. Nausea  30 tablet  1  . temazepam (RESTORIL) 30 MG capsule Take 1 capsule (30 mg total) by mouth at bedtime.  30 capsule  0  . warfarin (COUMADIN) 2 MG tablet Take 1 tablet (2 mg total) by mouth daily.  30 tablet  2  . zolendronic acid (ZOMETA) 4 MG/5ML injection Inject 4 mg into the vein every 28 (twenty-eight) days.       No current facility-administered medications for this visit.   Facility-Administered Medications Ordered in Other Visits  Medication Dose Route Frequency Provider Last Rate Last Dose  . ondansetron (ZOFRAN) tablet 8 mg  8 mg Oral Once Si Gaul, MD        SURGICAL HISTORY:  Past Surgical History  Procedure Laterality Date  . Total knee replaced  2000    Left knee  . Appendectomy    . Colonoscopy  2009  . Esophagogastroduodenoscopy  2009    mild inflammation at esophagogastric junction  . Excision of melanoma      back ( x3)  . Great toe surgery      bilateral  . Inguinal hernia repair      left, x 2  . Total knee arthroplasty  06/08/2011    Procedure: TOTAL KNEE ARTHROPLASTY;  Surgeon: Loreta Ave, MD;  Location: [facility] OR;  Service: Orthopedics;   Laterality: Right;  osteonics    REVIEW OF SYSTEMS:  [name] comprehensive review of systems was negative.   PHYSICAL EXAMINATION: General appearance: alert, cooperative and no distress Head: Normocephalic, without  obvious abnormality, atraumatic Neck: no adenopathy Lymph nodes: Cervical, supraclavicular, and axillary nodes normal. Resp: clear to auscultation bilaterally Cardio: regular rate and rhythm, S1, S2 normal, no murmur, click, rub or gallop GI: soft, non-tender; bowel sounds normal; no masses,  no organomegaly Extremities: extremities normal, atraumatic, no cyanosis or edema Neurologic: Alert and oriented X 3, normal strength and tone. Normal symmetric reflexes. Normal coordination and gait  ECOG PERFORMANCE STATUS: 0 - Asymptomatic  Blood pressure 115/70, pulse 63, temperature 97.8 F (36.6 C), temperature source Oral, resp. rate 18, height 5\' 10"  (1.778 m), weight 201 lb 11.2 oz (91.491 kg).  LABORATORY DATA: Lab Results  Component Value Date   WBC 3.9* 10/12/2012   HGB 10.0* 10/12/2012   HCT 30.7* 10/12/2012   MCV 86.7 10/12/2012   PLT 261 10/12/2012      Chemistry      Component Value Date/Time   NA 133* 10/12/2012 0858   NA 124* 02/20/2012 1317   K 3.8 10/12/2012 0858   K 3.9 02/20/2012 1317   CL 103 10/12/2012 0858   CL 101 02/20/2012 1317   CO2 26 10/12/2012 0858   CO2 24 02/20/2012 1317   BUN 9.7 10/12/2012 0858   BUN 9 02/20/2012 1317   CREATININE 0.9 10/12/2012 0858   CREATININE 0.92 02/20/2012 1317   CREATININE 0.98 01/18/2012 0934      Component Value Date/Time   CALCIUM 9.1 10/12/2012 0858   CALCIUM 7.7* 02/20/2012 1317   ALKPHOS 61 10/12/2012 0858   ALKPHOS 48 02/20/2012 1317   AST 16 10/12/2012 0858   AST 38* 02/20/2012 1317   ALT 13 10/12/2012 0858   ALT 33 02/20/2012 1317   BILITOT 0.45 10/12/2012 0858   BILITOT 0.2* 02/20/2012 1317       RADIOGRAPHIC STUDIES: No results found.  ASSESSMENT: This is [name] very pleasant 72 years old white male with multiple myeloma status post 10  cycles of systemic chemotherapy with Velcade, Revlimid and Decadron with mild initial improvement followed by stable disease.   PLAN: I have [name] lengthy discussion with the patient and his wife today about his condition. I agree with the recommendation of Dr. Marissa Calamity and was started the patient on chemotherapy in the form of Carfilzomib 36 mg/M2 on days 1, 2, 8, 9, 15 and 16 in addition to cyclophosphamide 300 mg/M2 IV on days 1, 8 and 15 as well as dexamethasone on days 1, 8, 15 and 22 every 4 weeks.  I discussed with the patient adverse effect of this chemotherapy including but not limited to alopecia, myelosuppression, nausea vomiting, peripheral neuropathy, liver, cardiac or renal dysfunction. He is expected to start the first cycle of this chemotherapy early next week.  He would come back for followup visit in 2 weeks for evaluation and management any adverse effect of his chemotherapy. I will start the patient on prophylactic acyclovir 400 mg by mouth twice [name] day during his treatment with Carfilzomib (Kyprolis). All questions were answered. The patient knows to call the clinic with any problems, questions or concerns. We can certainly see the patient much sooner if necessary.  I spent 20 minutes counseling the patient face to face. The total time spent in the appointment was 30 minutes.

## 2012-10-17 NOTE — Patient Instructions (Signed)
 Cancer Center Discharge Instructions for Patients Receiving Chemotherapy  Today you received the following chemotherapy agents ZOMETA  To help prevent nausea and vomiting after your treatment, we encourage you to take your nausea medication AS DIRECTED.   If you develop nausea and vomiting that is not controlled by your nausea medication, call the clinic. If it is after clinic hours your family physician or the after hours number for the clinic or go to the Emergency Department.   BELOW ARE SYMPTOMS THAT SHOULD BE REPORTED IMMEDIATELY:  *FEVER GREATER THAN 100.5 F  *CHILLS WITH OR WITHOUT FEVER  NAUSEA AND VOMITING THAT IS NOT CONTROLLED WITH YOUR NAUSEA MEDICATION  *UNUSUAL SHORTNESS OF BREATH  *UNUSUAL BRUISING OR BLEEDING  TENDERNESS IN MOUTH AND THROAT WITH OR WITHOUT PRESENCE OF ULCERS  *URINARY PROBLEMS  *BOWEL PROBLEMS  UNUSUAL RASH Items with * indicate a potential emergency and should be followed up as soon as possible.  One of the nurses will contact you 24 hours after your treatment. Please let the nurse know about any problems that you may have experienced. Feel free to call the clinic you have any questions or concerns. The clinic phone number is [phone].   I have been informed and understand all the instructions given to me. I know to contact the clinic, my physician, or go to the Emergency Department if any problems should occur. I do not have any questions at this time, but understand that I may call the clinic during office hours   should I have any questions or need assistance in obtaining follow up care.    __________________________________________  _____________  __________ Signature of Patient or Authorized Representative            Date                   Time    __________________________________________ Nurse's Signature

## 2012-10-18 ENCOUNTER — Other Ambulatory Visit: Payer: Self-pay | Admitting: *Deleted

## 2012-10-18 ENCOUNTER — Telehealth: Payer: Self-pay | Admitting: *Deleted

## 2012-10-18 ENCOUNTER — Ambulatory Visit: Payer: Medicare Other

## 2012-10-18 DIAGNOSIS — C9000 Multiple myeloma not having achieved remission: Secondary | ICD-10-CM

## 2012-10-18 MED ORDER — ACYCLOVIR 400 MG PO TABS: 400.0000 mg | ORAL_TABLET | Freq: Two times a day (BID) | ORAL | Status: DC

## 2012-10-18 MED ORDER — LIDOCAINE-PRILOCAINE 2.5-2.5 % EX CREA: TOPICAL_CREAM | CUTANEOUS | Status: DC | PRN

## 2012-10-18 NOTE — Progress Notes (Signed)
Pt requested having a port placed.  Per Dr Donnald Garre, okay to have port placed by IR, discussed EMLA cream with pt, he verbalized understanding.   SLJ

## 2012-10-18 NOTE — Telephone Encounter (Signed)
Patient called and wanted to go ahead and schedule his new chemo appts. Appts schedule and patietn will get calendar in my chart.    JMW

## 2012-10-18 NOTE — Telephone Encounter (Signed)
EKG and ECHO faxed to Dr Voorhee's office.

## 2012-10-22 ENCOUNTER — Other Ambulatory Visit: Payer: Self-pay | Admitting: Internal Medicine

## 2012-10-22 ENCOUNTER — Ambulatory Visit (HOSPITAL_BASED_OUTPATIENT_CLINIC_OR_DEPARTMENT_OTHER): Payer: Medicare Other

## 2012-10-22 ENCOUNTER — Other Ambulatory Visit (HOSPITAL_BASED_OUTPATIENT_CLINIC_OR_DEPARTMENT_OTHER): Payer: Medicare Other | Admitting: Lab

## 2012-10-22 ENCOUNTER — Other Ambulatory Visit: Payer: Self-pay | Admitting: Medical Oncology

## 2012-10-22 ENCOUNTER — Telehealth: Payer: Self-pay | Admitting: *Deleted

## 2012-10-22 ENCOUNTER — Other Ambulatory Visit: Payer: Medicare Other

## 2012-10-22 VITALS — BP 131/82 | HR 75 | Temp 98.1°F

## 2012-10-22 DIAGNOSIS — Z5112 Encounter for antineoplastic immunotherapy: Secondary | ICD-10-CM

## 2012-10-22 DIAGNOSIS — C9000 Multiple myeloma not having achieved remission: Secondary | ICD-10-CM

## 2012-10-22 DIAGNOSIS — Z5111 Encounter for antineoplastic chemotherapy: Secondary | ICD-10-CM

## 2012-10-22 LAB — COMPREHENSIVE METABOLIC PANEL (CC13)
ALT: 13 U/L (ref 0–55)
AST: 18 U/L (ref 5–34)
Alkaline Phosphatase: 61 U/L (ref 40–150)
Calcium: 9.2 mg/dL (ref 8.4–10.4)
Chloride: 104 mEq/L (ref 98–107)
Creatinine: 1.1 mg/dL (ref 0.7–1.3)
Potassium: 3.5 mEq/L (ref 3.5–5.1)

## 2012-10-22 LAB — CBC WITH DIFFERENTIAL/PLATELET
BASO%: 1.7 % (ref 0.0–2.0)
EOS%: 1.8 % (ref 0.0–7.0)
MCH: 28.4 pg (ref 27.2–33.4)
MCHC: 32.6 g/dL (ref 32.0–36.0)
RDW: 20 % — ABNORMAL HIGH (ref 11.0–14.6)
lymph#: 0.7 10*3/uL — ABNORMAL LOW (ref 0.9–3.3)

## 2012-10-22 MED ORDER — SODIUM CHLORIDE 0.9 % IV SOLN: 300.0000 mg/m2 | Freq: Once | INTRAVENOUS | Status: DC

## 2012-10-22 MED ORDER — SODIUM CHLORIDE 0.9 % IV SOLN
Freq: Once | INTRAVENOUS | Status: AC
  Administered 2012-10-22: 11:00:00 via INTRAVENOUS

## 2012-10-22 MED ORDER — SODIUM CHLORIDE 0.9 % IV SOLN
Freq: Once | INTRAVENOUS | Status: AC
  Administered 2012-10-22: 09:00:00 via INTRAVENOUS

## 2012-10-22 MED ORDER — DEXTROSE 5 % IV SOLN
20.0000 mg/m2 | Freq: Once | INTRAVENOUS | Status: AC
  Administered 2012-10-22: 42 mg via INTRAVENOUS
  Filled 2012-10-22: qty 21

## 2012-10-22 MED ORDER — INV-CARFILZOMIB CHEMO INJECTION 60 MG SWOG S1304: 20.0000 mg/m2 | Freq: Once | INTRAVENOUS | Status: DC

## 2012-10-22 MED ORDER — DEXAMETHASONE SODIUM PHOSPHATE 4 MG/ML IJ SOLN
20.0000 mg | Freq: Once | INTRAMUSCULAR | Status: AC
  Administered 2012-10-22: 20 mg via INTRAVENOUS

## 2012-10-22 MED ORDER — ONDANSETRON 8 MG/50ML IVPB (CHCC)
8.0000 mg | Freq: Once | INTRAVENOUS | Status: DC
  Administered 2012-10-22: 8 mg via INTRAVENOUS

## 2012-10-22 MED ORDER — SODIUM CHLORIDE 0.9 % IV SOLN
Freq: Once | INTRAVENOUS | Status: DC
  Administered 2012-10-22: 10:00:00 via INTRAVENOUS

## 2012-10-22 MED ORDER — SODIUM CHLORIDE 0.9 % IV SOLN
300.0000 mg/m2 | Freq: Once | INTRAVENOUS | Status: AC
  Administered 2012-10-22: 640 mg via INTRAVENOUS
  Filled 2012-10-22: qty 32

## 2012-10-22 NOTE — Patient Instructions (Addendum)
[facility] Discharge Instructions for Patients Receiving Chemotherapy  Today you received the following chemotherapy agents Kyprolis/Cytoxan.  To help prevent nausea and vomiting after your treatment, we encourage you to take your nausea medication as prescribed.   If you develop nausea and vomiting that is not controlled by your nausea medication, call the clinic. If it is after clinic hours your family physician or the after hours number for the clinic or go to the Emergency Department.   BELOW ARE SYMPTOMS THAT SHOULD BE REPORTED IMMEDIATELY:  *FEVER GREATER THAN 100.5 F  *CHILLS WITH OR WITHOUT FEVER  NAUSEA AND VOMITING THAT IS NOT CONTROLLED WITH YOUR NAUSEA MEDICATION  *UNUSUAL SHORTNESS OF BREATH  *UNUSUAL BRUISING OR BLEEDING  TENDERNESS IN MOUTH AND THROAT WITH OR WITHOUT PRESENCE OF ULCERS  *URINARY PROBLEMS  *BOWEL PROBLEMS  UNUSUAL RASH Items with * indicate a potential emergency and should be followed up as soon as possible.  One of the nurses will contact you 24 hours after your treatment. Please let the nurse know about any problems that you may have experienced. Feel free to call the clinic you have any questions or concerns. The clinic phone number is [phone].   I have been informed and understand all the instructions given to me. I know to contact the clinic, my physician, or go to the Emergency Department if any problems should occur. I do not have any questions at this time, but understand that I may call the clinic during office hours   should I have any questions or need assistance in obtaining follow up care.    __________________________________________  _____________  __________ Signature of Patient or Authorized Representative            Date                   Time    __________________________________________ Nurse's Signature

## 2012-10-22 NOTE — Telephone Encounter (Signed)
appts have been made..the patient will get a print out today of his schedule. Waiting ti hear back from [facility] for a d/t slot so that pt can have his port place on 10/30/12 @ 8:30am...td

## 2012-10-23 ENCOUNTER — Telehealth: Payer: Self-pay | Admitting: Medical Oncology

## 2012-10-23 ENCOUNTER — Other Ambulatory Visit: Payer: Self-pay | Admitting: Internal Medicine

## 2012-10-23 ENCOUNTER — Ambulatory Visit (HOSPITAL_BASED_OUTPATIENT_CLINIC_OR_DEPARTMENT_OTHER): Payer: Medicare Other

## 2012-10-23 VITALS — BP 128/69 | HR 85 | Temp 98.2°F

## 2012-10-23 DIAGNOSIS — C9000 Multiple myeloma not having achieved remission: Secondary | ICD-10-CM

## 2012-10-23 DIAGNOSIS — Z5112 Encounter for antineoplastic immunotherapy: Secondary | ICD-10-CM

## 2012-10-23 MED ORDER — ONDANSETRON 8 MG/50ML IVPB (CHCC)
8.0000 mg | Freq: Once | INTRAVENOUS | Status: AC
  Administered 2012-10-23: 8 mg via INTRAVENOUS

## 2012-10-23 MED ORDER — SODIUM CHLORIDE 0.9 % IV SOLN
Freq: Once | INTRAVENOUS | Status: AC
  Administered 2012-10-23: 08:00:00 via INTRAVENOUS

## 2012-10-23 MED ORDER — DEXAMETHASONE SODIUM PHOSPHATE 10 MG/ML IJ SOLN
10.0000 mg | Freq: Once | INTRAMUSCULAR | Status: AC
  Administered 2012-10-23: 10 mg via INTRAVENOUS

## 2012-10-23 MED ORDER — SODIUM CHLORIDE 0.9 % IV SOLN
Freq: Once | INTRAVENOUS | Status: AC
  Administered 2012-10-23: 10:00:00 via INTRAVENOUS

## 2012-10-23 MED ORDER — CARFILZOMIB CHEMO INJECTION 60 MG
20.0000 mg/m2 | Freq: Once | INTRAVENOUS | Status: AC
  Administered 2012-10-23: 42 mg via INTRAVENOUS
  Filled 2012-10-23: qty 21

## 2012-10-23 NOTE — Telephone Encounter (Signed)
Called IR and told them to please schedule port for 10/30/12. I will adjust other appts per Dr Arbutus Ped.

## 2012-10-23 NOTE — Patient Instructions (Addendum)
[facility] Discharge Instructions for Patients Receiving Chemotherapy  Today you received the following chemotherapy agents kyprolis  To help prevent nausea and vomiting after your treatment, we encourage you to take your nausea medication as needed   If you develop nausea and vomiting that is not controlled by your nausea medication, call the clinic. If it is after clinic hours your family physician or the after hours number for the clinic or go to the Emergency Department.   BELOW ARE SYMPTOMS THAT SHOULD BE REPORTED IMMEDIATELY:  *FEVER GREATER THAN 100.5 F  *CHILLS WITH OR WITHOUT FEVER  NAUSEA AND VOMITING THAT IS NOT CONTROLLED WITH YOUR NAUSEA MEDICATION  *UNUSUAL SHORTNESS OF BREATH  *UNUSUAL BRUISING OR BLEEDING  TENDERNESS IN MOUTH AND THROAT WITH OR WITHOUT PRESENCE OF ULCERS  *URINARY PROBLEMS  *BOWEL PROBLEMS  UNUSUAL RASH Items with * indicate a potential emergency and should be followed up as soon as possible.   The clinic phone number is [phone].   I have been informed and understand all the instructions given to me. I know to contact the clinic, my physician, or go to the Emergency Department if any problems should occur. I do not have any questions at this time, but understand that I may call the clinic during office hours   should I have any questions or need assistance in obtaining follow up care.    __________________________________________  _____________  __________ Signature of Patient or Authorized Representative            Date                   Time    __________________________________________ Nurse's Signature    

## 2012-10-24 ENCOUNTER — Encounter (HOSPITAL_COMMUNITY): Payer: Self-pay | Admitting: Pharmacy Technician

## 2012-10-24 ENCOUNTER — Telehealth: Payer: Self-pay | Admitting: *Deleted

## 2012-10-24 NOTE — Telephone Encounter (Signed)
Only adverse event from chemo has been cough-reports coughing all night that Nyquil did not help. Does seem to be better today. No fever or dyspnea. Instructed him to call if it does not continue to improve. Made him aware this is a common side effect. MD notified.

## 2012-10-25 ENCOUNTER — Other Ambulatory Visit: Payer: Self-pay | Admitting: Radiology

## 2012-10-25 ENCOUNTER — Ambulatory Visit: Payer: Medicare Other | Admitting: Internal Medicine

## 2012-10-26 ENCOUNTER — Other Ambulatory Visit: Payer: Self-pay | Admitting: Radiology

## 2012-10-29 ENCOUNTER — Ambulatory Visit: Payer: Medicare Other

## 2012-10-29 ENCOUNTER — Other Ambulatory Visit: Payer: Medicare Other | Admitting: Lab

## 2012-10-30 ENCOUNTER — Encounter (HOSPITAL_COMMUNITY): Payer: Self-pay

## 2012-10-30 ENCOUNTER — Other Ambulatory Visit: Payer: Medicare Other

## 2012-10-30 ENCOUNTER — Telehealth: Payer: Self-pay | Admitting: Internal Medicine

## 2012-10-30 ENCOUNTER — Ambulatory Visit (HOSPITAL_COMMUNITY)
Admission: RE | Admit: 2012-10-30 | Discharge: 2012-10-30 | Disposition: A | Discharge status: Home / Self Care | Payer: Medicare Other | Source: Ambulatory Visit | Attending: Internal Medicine | Admitting: Internal Medicine

## 2012-10-30 ENCOUNTER — Encounter: Payer: Self-pay | Admitting: Internal Medicine

## 2012-10-30 ENCOUNTER — Ambulatory Visit (HOSPITAL_BASED_OUTPATIENT_CLINIC_OR_DEPARTMENT_OTHER): Payer: Medicare Other | Admitting: Internal Medicine

## 2012-10-30 ENCOUNTER — Ambulatory Visit (HOSPITAL_BASED_OUTPATIENT_CLINIC_OR_DEPARTMENT_OTHER): Payer: Medicare Other

## 2012-10-30 ENCOUNTER — Other Ambulatory Visit: Payer: Self-pay | Admitting: Internal Medicine

## 2012-10-30 VITALS — BP 136/72 | HR 79 | Temp 96.9°F | Resp 18 | Ht 70.5 in | Wt 201.8 lb

## 2012-10-30 DIAGNOSIS — C9000 Multiple myeloma not having achieved remission: Secondary | ICD-10-CM

## 2012-10-30 DIAGNOSIS — Z5111 Encounter for antineoplastic chemotherapy: Secondary | ICD-10-CM

## 2012-10-30 DIAGNOSIS — Z5112 Encounter for antineoplastic immunotherapy: Secondary | ICD-10-CM

## 2012-10-30 LAB — BASIC METABOLIC PANEL
BUN: 15 mg/dL (ref 6–23)
CO2: 25 mEq/L (ref 19–32)
Calcium: 8.5 mg/dL (ref 8.4–10.5)
Creatinine, Ser: 0.92 mg/dL (ref 0.50–1.35)
Glucose, Bld: 101 mg/dL — ABNORMAL HIGH (ref 70–99)

## 2012-10-30 LAB — CBC
MCH: 28.3 pg (ref 26.0–34.0)
MCHC: 32.8 g/dL (ref 30.0–36.0)
MCV: 86.3 fL (ref 78.0–100.0)
Platelets: 249 10*3/uL (ref 150–400)
RBC: 3.57 MIL/uL — ABNORMAL LOW (ref 4.22–5.81)
RDW: 18.4 % — ABNORMAL HIGH (ref 11.5–15.5)

## 2012-10-30 MED ORDER — SODIUM CHLORIDE 0.9 % IV SOLN
Freq: Once | INTRAVENOUS | Status: AC
  Administered 2012-10-30: 20 mL/h via INTRAVENOUS

## 2012-10-30 MED ORDER — DEXAMETHASONE SODIUM PHOSPHATE 10 MG/ML IJ SOLN
10.0000 mg | Freq: Once | INTRAMUSCULAR | Status: AC
  Administered 2012-10-30: 10 mg via INTRAVENOUS

## 2012-10-30 MED ORDER — HEPARIN SOD (PORK) LOCK FLUSH 100 UNIT/ML IV SOLN: INTRAVENOUS | Status: DC | PRN

## 2012-10-30 MED ORDER — SODIUM CHLORIDE 0.9 % IV SOLN
300.0000 mg/m2 | Freq: Once | INTRAVENOUS | Status: AC
  Administered 2012-10-30: 640 mg via INTRAVENOUS
  Filled 2012-10-30: qty 32

## 2012-10-30 MED ORDER — VANCOMYCIN HCL IN DEXTROSE 1-5 GM/200ML-% IV SOLN
INTRAVENOUS | Status: AC
  Filled 2012-10-30: qty 200

## 2012-10-30 MED ORDER — SODIUM CHLORIDE 0.9 % IV SOLN
Freq: Once | INTRAVENOUS | Status: AC
  Administered 2012-10-30: 12:00:00 via INTRAVENOUS

## 2012-10-30 MED ORDER — CARFILZOMIB CHEMO INJECTION 60 MG
36.0000 mg/m2 | Freq: Once | INTRAVENOUS | Status: AC
  Administered 2012-10-30: 76 mg via INTRAVENOUS
  Filled 2012-10-30: qty 38

## 2012-10-30 MED ORDER — SODIUM CHLORIDE 0.9 % IV SOLN
Freq: Once | INTRAVENOUS | Status: AC
  Administered 2012-10-30: 15:00:00 via INTRAVENOUS

## 2012-10-30 MED ORDER — MIDAZOLAM HCL 2 MG/2ML IJ SOLN
INTRAMUSCULAR | Status: AC | PRN
  Administered 2012-10-30: 0.5 mg via INTRAVENOUS
  Administered 2012-10-30: 1 mg via INTRAVENOUS
  Administered 2012-10-30 (×2): 0.5 mg via INTRAVENOUS
  Administered 2012-10-30: 1 mg via INTRAVENOUS

## 2012-10-30 MED ORDER — FENTANYL CITRATE 0.05 MG/ML IJ SOLN
INTRAMUSCULAR | Status: AC
  Filled 2012-10-30: qty 6

## 2012-10-30 MED ORDER — SODIUM CHLORIDE 0.9 % IJ SOLN
10.0000 mL | INTRAMUSCULAR | Status: DC | PRN
  Filled 2012-10-30: qty 10

## 2012-10-30 MED ORDER — FENTANYL CITRATE 0.05 MG/ML IJ SOLN
INTRAMUSCULAR | Status: AC | PRN
  Administered 2012-10-30: 100 ug via INTRAVENOUS
  Administered 2012-10-30 (×3): 25 ug via INTRAVENOUS

## 2012-10-30 MED ORDER — ONDANSETRON 8 MG/50ML IVPB (CHCC)
8.0000 mg | Freq: Once | INTRAVENOUS | Status: AC
  Administered 2012-10-30: 8 mg via INTRAVENOUS

## 2012-10-30 MED ORDER — LIDOCAINE HCL 1 % IJ SOLN
INTRAMUSCULAR | Status: AC
  Filled 2012-10-30: qty 20

## 2012-10-30 MED ORDER — VANCOMYCIN HCL IN DEXTROSE 1-5 GM/200ML-% IV SOLN
1000.0000 mg | Freq: Once | INTRAVENOUS | Status: AC
  Administered 2012-10-30: 1000 mg via INTRAVENOUS

## 2012-10-30 MED ORDER — MIDAZOLAM HCL 2 MG/2ML IJ SOLN
INTRAMUSCULAR | Status: AC
  Filled 2012-10-30: qty 6

## 2012-10-30 MED ORDER — HEPARIN SOD (PORK) LOCK FLUSH 100 UNIT/ML IV SOLN
500.0000 [IU] | Freq: Once | INTRAVENOUS | Status: DC | PRN
  Filled 2012-10-30: qty 5

## 2012-10-30 NOTE — Patient Instructions (Addendum)
Carfilzomib injection What is this medicine? CARFILZOMIB is a chemotherapy drug that works by slowing or stopping cancer cell growth. This medicine is used to treat multiple myeloma. This medicine may be used for other purposes; ask your health care provider or pharmacist if you have questions. What should I tell my health care provider before I take this medicine? They need to know if you have any of these conditions: -heart disease -irregular heartbeat -liver disease -lung or breathing disease -an unusual or allergic reaction to carfilzomib, or other medicines, foods, dyes, or preservatives -pregnant or trying to get pregnant -breast-feeding How should I use this medicine? This medicine is for injection or infusion into a vein. It is given by a health care professional in a hospital or clinic setting. Talk to your pediatrician regarding the use of this medicine in children. Special care may be needed. Overdosage: If you think you've taken too much of this medicine contact a poison control center or emergency room at once. Overdosage: If you think you have taken too much of this medicine contact a poison control center or emergency room at once. NOTE: This medicine is only for you. Do not share this medicine with others. What if I miss a dose? It is important not to miss your dose. Call your doctor or health care professional if you are unable to keep an appointment. What may interact with this medicine? Interactions are not expected. Give your health care provider a list of all the medicines, herbs, non-prescription drugs, or dietary supplements you use. Also tell them if you smoke, drink alcohol, or use illegal drugs. Some items may interact with your medicine. This list may not describe all possible interactions. Give your health care provider a list of all the medicines, herbs, non-prescription drugs, or dietary supplements you use. Also tell them if you smoke, drink alcohol, or use  illegal drugs. Some items may interact with your medicine. What should I watch for while using this medicine? Your condition will be monitored carefully while you are receiving this medicine. Report any side effects. Continue your course of treatment even though you feel ill unless your doctor tells you to stop. Call your doctor or health care professional for advice if you get a fever, chills or sore throat, or other symptoms of a cold or flu. Do not treat yourself. Try to avoid being around people who are sick. Do not become pregnant while taking this medicine. Women should inform their doctor if they wish to become pregnant or think they might be pregnant. There is a potential for serious side effects to an unborn child. Talk to your health care professional or pharmacist for more information. Do not breast-feed an infant while taking this medicine. Check with your doctor or health care professional if you get an attack of severe diarrhea, nausea and vomiting, or if you sweat a lot. The loss of too much body fluid can make it dangerous for you to take this medicine. You may get dizzy. Do not drive, use machinery, or do anything that needs mental alertness until you know how this medicine affects you. Do not stand or sit up quickly, especially if you are an older patient. This reduces the risk of dizzy or fainting spells. What side effects may I notice from receiving this medicine? Side effects that you should report to your doctor or health care professional as soon as possible: -allergic reactions like skin rash, itching or hives, swelling of the face, lips, or tongue -breathing  problems -chest pain or palpitationschest tightness -cough -dark urine -dizziness -feeling faint or lightheaded -fever or chills -general ill feeling or flu-like symptoms -light-colored stools -palpitations -right upper belly pain -swelling of the legs or ankles -unusual bleeding or bruising -unusually weak or  tired -yellowing of the eyes or skin  Side effects that usually do not require medical attention (Report these to your doctor or health care professional if they continue or are bothersome.): -diarrhea -headache -nausea, vomiting -tiredness This list may not describe all possible side effects. Call your doctor for medical advice about side effects. You may report side effects to FDA at 1-800-FDA-1088. Where should I keep my medicine? This drug is given in a hospital or clinic and will not be stored at home. NOTE: This sheet is a summary. It may not cover all possible information. If you have questions about this medicine, talk to your doctor, pharmacist, or health care provider.  2013, [employer]. (06/16/2011 4:16:30 PM) Carboplatin injection What is this medicine? CARBOPLATIN (KAR boe pla tin) is a chemotherapy drug. It targets fast dividing cells, like cancer cells, and causes these cells to die. This medicine is used to treat ovarian cancer and many other cancers. This medicine may be used for other purposes; ask your health care provider or pharmacist if you have questions. What should I tell my health care provider before I take this medicine? They need to know if you have any of these conditions: -blood disorders -hearing problems -kidney disease -recent or ongoing radiation therapy -an unusual or allergic reaction to carboplatin, cisplatin, other chemotherapy, other medicines, foods, dyes, or preservatives -pregnant or trying to get pregnant -breast-feeding How should I use this medicine? This drug is usually given as an infusion into a vein. It is administered in a hospital or clinic by a specially trained health care professional. Talk to your pediatrician regarding the use of this medicine in children. Special care may be needed. Overdosage: If you think you have taken too much of this medicine contact a poison control center or emergency room at once. NOTE: This  medicine is only for you. Do not share this medicine with others. What if I miss a dose? It is important not to miss a dose. Call your doctor or health care professional if you are unable to keep an appointment. What may interact with this medicine? -medicines for seizures -medicines to increase blood counts like filgrastim, pegfilgrastim, sargramostim -some antibiotics like amikacin, gentamicin, neomycin, streptomycin, tobramycin -vaccines Talk to your doctor or health care professional before taking any of these medicines: -acetaminophen -aspirin -ibuprofen -ketoprofen -naproxen This list may not describe all possible interactions. Give your health care provider a list of all the medicines, herbs, non-prescription drugs, or dietary supplements you use. Also tell them if you smoke, drink alcohol, or use illegal drugs. Some items may interact with your medicine. What should I watch for while using this medicine? Your condition will be monitored carefully while you are receiving this medicine. You will need important blood work done while you are taking this medicine. This drug may make you feel generally unwell. This is not uncommon, as chemotherapy can affect healthy cells as well as cancer cells. Report any side effects. Continue your course of treatment even though you feel ill unless your doctor tells you to stop. In some cases, you may be given additional medicines to help with side effects. Follow all directions for their use. Call your doctor or health care professional for advice if you  get a fever, chills or sore throat, or other symptoms of a cold or flu. Do not treat yourself. This drug decreases your body's ability to fight infections. Try to avoid being around people who are sick. This medicine may increase your risk to bruise or bleed. Call your doctor or health care professional if you notice any unusual bleeding. Be careful brushing and flossing your teeth or using a toothpick  because you may get an infection or bleed more easily. If you have any dental work done, tell your dentist you are receiving this medicine. Avoid taking products that contain aspirin, acetaminophen, ibuprofen, naproxen, or ketoprofen unless instructed by your doctor. These medicines may hide a fever. Do not become pregnant while taking this medicine. Women should inform their doctor if they wish to become pregnant or think they might be pregnant. There is a potential for serious side effects to an unborn child. Talk to your health care professional or pharmacist for more information. Do not breast-feed an infant while taking this medicine. What side effects may I notice from receiving this medicine? Side effects that you should report to your doctor or health care professional as soon as possible: -allergic reactions like skin rash, itching or hives, swelling of the face, lips, or tongue -signs of infection - fever or chills, cough, sore throat, pain or difficulty passing urine -signs of decreased platelets or bleeding - bruising, pinpoint red spots on the skin, black, tarry stools, nosebleeds -signs of decreased red blood cells - unusually weak or tired, fainting spells, lightheadedness -breathing problems -changes in hearing -changes in vision -chest pain -high blood pressure -low blood counts - This drug may decrease the number of white blood cells, red blood cells and platelets. You may be at increased risk for infections and bleeding. -nausea and vomiting -pain, swelling, redness or irritation at the injection site -pain, tingling, numbness in the hands or feet -problems with balance, talking, walking -trouble passing urine or change in the amount of urine Side effects that usually do not require medical attention (report to your doctor or health care professional if they continue or are bothersome): -hair loss -loss of appetite -metallic taste in the mouth or changes in taste This list  may not describe all possible side effects. Call your doctor for medical advice about side effects. You may report side effects to FDA at 1-800-FDA-1088. Where should I keep my medicine? This drug is given in a hospital or clinic and will not be stored at home. NOTE: This sheet is a summary. It may not cover all possible information. If you have questions about this medicine, talk to your doctor, pharmacist, or health care provider.  2012, [employer] Standard. (10/02/2007 2:38:05 PM)

## 2012-10-30 NOTE — Procedures (Signed)
Procedure:  Porta-cath Access:  Right IJ vein SL PAC with cath tip at cavoatrial junction.  No PTX.  Accessed and OK to use.

## 2012-10-30 NOTE — Patient Instructions (Signed)
Continue treatment with Carfilzomib, cyclophosphamide and Decadron as scheduled. Followup visit in 3 weeks.

## 2012-10-30 NOTE — Telephone Encounter (Signed)
gv pt appt schedule for April and May. Per MM f/u schedule for 5/15 w/day2 tx due to he will be out 5/14.

## 2012-10-30 NOTE — H&P (Signed)
Agree 

## 2012-10-30 NOTE — Progress Notes (Signed)
Pt with new pac, left accessed. Ok to use per dr Fredia Sorrow. Good blood return.  Will leave accessed for tx tomorrow.  dmr

## 2012-10-30 NOTE — H&P (Signed)
Chief Complaint: "I'm here for a port" Referring Physician:[name] HPI: [name] is an 72 y.o. male with history of myeloma. He is to begin chemotherapy and is scheduled today for portacath placement. PMHx and meds reviewed. Pt feels well, no recent fevers, illness.  Past Medical History:  Past Medical History  Diagnosis Date  . GERD (gastroesophageal reflux disease)   . Adenomatous polyp of colon 1996  . Arthritis     Osteoarthritis right knee, spine, wrist  . Benign prostatic hypertrophy     (Dr. Vernie Ammons)  . History of chronic prostatitis   . History of melanoma     Back (Dr. Karlyn Agee); early melanoma R arm 03/2011  . Diverticulosis   . Arthritis of hand, right     Right thumb (Dr. Teressa Senter)  . Degenerative disc disease   . Hemorrhoids   . Erosive esophagitis   . Back ache     r/o Multiple Myeloma  . Multiple myeloma 2013    Past Surgical History:  Past Surgical History  Procedure Laterality Date  . Total knee replaced  2000    Left knee  . Appendectomy    . Colonoscopy  2009  . Esophagogastroduodenoscopy  2009    mild inflammation at esophagogastric junction  . Excision of melanoma      back ( x3)  . Great toe surgery      bilateral  . Inguinal hernia repair      left, x 2  . Total knee arthroplasty  06/08/2011    Procedure: TOTAL KNEE ARTHROPLASTY;  Surgeon: Loreta Ave, MD;  Location: [facility] OR;  Service: Orthopedics;  Laterality: Right;  osteonics    Family History:  Family History  Problem Relation Age of Onset  . Dementia Mother   . Stroke Father   . Hypertension Father   . Diabetes Father   . Prostate cancer Maternal Grandfather   . Anesthesia problems Neg Hx     Social History:  reports that he quit smoking about 36 years ago. His smoking use included Cigarettes. He has a 30 pack-year smoking history. He has never used smokeless tobacco. He reports that he drinks about 1.5 ounces of alcohol per week. He reports that he does not use illicit  drugs.  Allergies:  Allergies  Allergen Reactions  . Penicillins     REACTION: rash    Medications: acyclovir (ZOVIRAX) 400 MG tablet (Taking) 60 tablet 5 10/18/2012 Sig - Route: Take 1 tablet (400 mg total) by mouth 2 (two) times daily. - Oral Class: No Print ascorbic acid (VITAMIN C) 500 MG tablet (Taking) Sig - Route: Take 500 mg by mouth daily. - Oral Class: Historical Med Number of times this order has been changed since signing: 1 Order [address] B Complex-C (B-COMPLEX WITH VITAMIN C) tablet (Taking) Sig - Route: Take 1 tablet by mouth daily. - Oral Class: Historical Med CELEBREX 200 MG capsule (Taking) 03/27/2012 Sig - Route: Take 200 mg by mouth daily as needed for pain. - Oral Class: Historical Med Number of times this order has been changed since signing: 3 Order [address] cholecalciferol (VITAMIN D) 1000 UNITS tablet (Taking) Sig - Route: Take 1,000 Units by mouth daily. - Oral Class: Historical Med Number of times this order has been changed since signing: 1 Order [address] dexamethasone (DECADRON) 4 MG tablet (Taking) 120 tablet 0 10/17/2012 Sig - Route: Take 10 tablets (40 mg total) by mouth once a week. Takes on Monday am. - Oral fish  oil-omega-3 fatty acids 1000 MG capsule (Taking) Sig - Route: Take 1 g by mouth daily. - Oral Class: Historical Med Number of times this order has been changed since signing: 2 Order [address] HYDROcodone-acetaminophen (NORCO) 7.5-325 MG per tablet (Taking) 04/17/2012 Sig - Route: Take 1 tablet by mouth every 6 (six) hours as needed for pain. - Oral Class: Historical Med Number of times this order has been changed since signing: 1 Order [address] lidocaine-prilocaine (EMLA) cream (Taking) 30 g 0 10/18/2012 Sig - Route: Apply topically as needed. Apply to the port 1 hour before chemo - Topical Multiple Vitamin (MULTIVITAMIN) capsule (Taking) Sig - Route: Take 1 capsule by mouth daily. - Oral Class: Historical Med Number of times this order has been changed  since signing: 1 Order [address] pantoprazole (PROTONIX) 40 MG tablet (Taking) 60 tablet 0 10/04/2012 Sig - Route: Take 1 tablet (40 mg total) by mouth 2 (two) times daily. - Oral potassium chloride SA (K-DUR,KLOR-CON) 20 MEQ tablet (Taking) 03/07/2012 03/07/2013 Sig - Route: Take 20 mEq by mouth daily. - Oral Class: Historical Med Probiotic Product (ALIGN) 4 MG CAPS (Taking) Sig - Route: Take 1 capsule by mouth daily. - Oral Class: Historical Med Number of times this order has been changed since signing: 1 Order [address] prochlorperazine (COMPAZINE) 10 MG tablet (Taking) 30 tablet 1 08/13/2012 Sig - Route: Take 1 tablet (10 mg total) by mouth every 6 (six) hours as needed. Nausea - Oral temazepam (RESTORIL) 30 MG capsule (Taking) 30 capsule 0 10/17/2012 Sig - Route: Take 1 capsule (30 mg total) by mouth at bedtime. - Oral Class: Phone In zolendronic acid (ZOMETA) 4 MG/5ML injection (Taking) Sig - Route: Inject 4 mg into the vein every 28 (twenty-eight) days   Please HPI for pertinent positives, otherwise complete 10 system ROS negative.  Physical Exam: Blood pressure 119/80, pulse 66, temperature 97.4 F (36.3 C), temperature source Oral, resp. rate 20, height 5' 10.5" (1.791 m), weight 196 lb (88.905 kg), SpO2 97.00%. Body mass index is 27.72 kg/(m^2).   General Appearance:  Alert, cooperative, no distress, appears stated age  Head:  Normocephalic, without obvious abnormality, atraumatic  ENT: Unremarkable  Neck: Supple, symmetrical, trachea midline  Lungs:   Clear to auscultation bilaterally, no w/r/r, respirations unlabored without use of accessory muscles.  Chest Wall:  No tenderness or deformity  Heart:  Regular rate and rhythm, S1, S2 normal, no murmur, rub or gallop.   Neurologic: Normal affect, no gross deficits.   Results for orders placed during the hospital encounter of 10/30/12 (from the past 48 hour(s))  APTT     Status: None   Collection Time    10/30/12  6:50 AM      Result Value  Range   aPTT 25  24 - 37 seconds  BASIC METABOLIC PANEL     Status: Abnormal   Collection Time    10/30/12  6:50 AM      Result Value Range   Sodium 136  135 - 145 mEq/L   Potassium 3.7  3.5 - 5.1 mEq/L   Chloride 107  96 - 112 mEq/L   CO2 25  19 - 32 mEq/L   Glucose, Bld 101 (*) 70 - 99 mg/dL   BUN 15  6 - 23 mg/dL   Creatinine, Ser 1.30  0.50 - 1.35 mg/dL   Calcium 8.5  8.4 - 86.5 mg/dL   GFR calc non Af Amer 83 (*) >90 mL/min   GFR calc  Af Amer >90  >90 mL/min   Comment:            The eGFR has been calculated     using the CKD EPI equation.     This calculation has not been     validated in all clinical     situations.     eGFR's persistently     <90 mL/min signify     possible Chronic Kidney Disease.  CBC     Status: Abnormal   Collection Time    10/30/12  6:50 AM      Result Value Range   WBC 8.4  4.0 - 10.5 K/uL   RBC 3.57 (*) 4.22 - 5.81 MIL/uL   Hemoglobin 10.1 (*) 13.0 - 17.0 g/dL   HCT 16.1 (*) 09.6 - 04.5 %   MCV 86.3  78.0 - 100.0 fL   MCH 28.3  26.0 - 34.0 pg   MCHC 32.8  30.0 - 36.0 g/dL   RDW 40.9 (*) 81.1 - 91.4 %   Platelets 249  150 - 400 K/uL  PROTIME-INR     Status: None   Collection Time    10/30/12  6:50 AM      Result Value Range   Prothrombin Time 12.7  11.6 - 15.2 seconds   INR 0.96  0.00 - 1.49   No results found.  Assessment/Plan Myeloma For port placement. Discussed procedure, risks, complications, use of sedation. Labs reviewed, ok. Consent signed in chart  Brayton El PA-C 10/30/2012, 8:07 AM

## 2012-10-30 NOTE — Progress Notes (Signed)
[facility] Telephone:(336) [phone]   [phone]  OFFICE PROGRESS NOTE  [name],[name], MD [address] [place] [address]  DIAGNOSIS: Stage IIA multiple myeloma diagnosed in July of 2013   PRIOR THERAPY:  1) Systemic chemotherapy with subcutaneous Velcade 1.3 mg/M2 on days 1, 4, 8 and 11 every 3 weeks in addition to Revlimid 25 mg by mouth daily for 14 days every 3 weeks and dexamethasone 40 mg on a weekly basis. Status post 4 planned cycles.  2) Systemic chemotherapy with subcutaneous Velcade 1.3 mg/M2 on days 1, 4, 8 and 11 every 3 weeks in addition to Revlimid 25 mg by mouth daily for 14 days every 3 weeks and dexamethasone 40 mg on a weekly basis. 4 more cycles are planned and he is status post 4 cycles of the second set of 4 cycles, now status post a total of 10 cycles   CURRENT THERAPY:  1. Systemic chemotherapy with Carfilzomib 36 mg/M2 on days 1, 2, 8, 9, 15 and 16 in addition to cyclophosphamide 300 mg/M2 IV on days 1, 8 and 15 as well as dexamethasone on days 1, 8, 15 and 22 every 4 weeks.  2. Zometa 4 mg IV given a monthly basis for bone disease  INTERVAL HISTORY: [name] 72 y.o. male returns to the clinic today for followup visit. The patient tolerated the first week of his current treatment with Carfilzomib, cyclophosphamide and dexamethasone fairly well. He has mild fatigue but denied having any significant nausea or vomiting. He denied having any significant peripheral neuropathy. He has no weight loss or night sweats. The patient has no chest pain, shortness breath, cough or hemoptysis. He complained of chest congestion as well as other respiratory wheezes after his chemotherapy which is currently resolved. He has no fever or chills. He is here today to start the second week of his chemotherapy.  MEDICAL HISTORY: Past Medical History  Diagnosis Date  . GERD (gastroesophageal reflux disease)   . Adenomatous polyp of colon 1996    . Arthritis     Osteoarthritis right knee, spine, wrist  . Benign prostatic hypertrophy     (Dr. Vernie Ammons)  . History of chronic prostatitis   . History of melanoma     Back (Dr. Karlyn Agee); early melanoma R arm 03/2011  . Diverticulosis   . Arthritis of hand, right     Right thumb (Dr. Teressa Senter)  . Degenerative disc disease   . Hemorrhoids   . Erosive esophagitis   . Back ache     r/o Multiple Myeloma  . Multiple myeloma 2013    ALLERGIES:  is allergic to penicillins.  MEDICATIONS:  Current Outpatient Prescriptions  Medication Sig Dispense Refill  . acyclovir (ZOVIRAX) 400 MG tablet Take 1 tablet (400 mg total) by mouth 2 (two) times daily.  60 tablet  5  . ascorbic acid (VITAMIN C) 500 MG tablet Take 500 mg by mouth daily.      Marland Kitchen aspirin EC 81 MG tablet Take 81 mg by mouth every other day.      . B Complex-C (B-COMPLEX WITH VITAMIN C) tablet Take 1 tablet by mouth daily.      . CELEBREX 200 MG capsule Take 200 mg by mouth daily as needed for pain.       . cholecalciferol (VITAMIN D) 1000 UNITS tablet Take 1,000 Units by mouth daily.      Marland Kitchen dexamethasone (DECADRON) 4 MG tablet Take 10 tablets (  40 mg total) by mouth once a week. Takes on Monday am.  120 tablet  0  . fish oil-omega-3 fatty acids 1000 MG capsule Take 1 g by mouth daily.       Marland Kitchen HYDROcodone-acetaminophen (NORCO) 7.5-325 MG per tablet Take 1 tablet by mouth every 6 (six) hours as needed for pain.       Marland Kitchen lidocaine-prilocaine (EMLA) cream Apply topically as needed. Apply to the port 1 hour before chemo  30 g  0  . Multiple Vitamin (MULTIVITAMIN) capsule Take 1 capsule by mouth daily.      . pantoprazole (PROTONIX) 40 MG tablet Take 1 tablet (40 mg total) by mouth 2 (two) times daily.  60 tablet  0  . potassium chloride SA (K-DUR,KLOR-CON) 20 MEQ tablet Take 20 mEq by mouth daily.      . Probiotic Product (ALIGN) 4 MG CAPS Take 1 capsule by mouth daily.      . prochlorperazine (COMPAZINE) 10 MG tablet Take 1 tablet (10  mg total) by mouth every 6 (six) hours as needed. Nausea  30 tablet  1  . temazepam (RESTORIL) 30 MG capsule Take 1 capsule (30 mg total) by mouth at bedtime.  30 capsule  0  . zolendronic acid (ZOMETA) 4 MG/5ML injection Inject 4 mg into the vein every 28 (twenty-eight) days.       No current facility-administered medications for this visit.   Facility-Administered Medications Ordered in Other Visits  Medication Dose Route Frequency Provider Last Rate Last Dose  . fentaNYL (SUBLIMAZE) 0.05 MG/ML injection           . lidocaine (XYLOCAINE) 1 % (with pres) injection           . midazolam (VERSED) 2 MG/2ML injection           . ondansetron (ZOFRAN) tablet 8 mg  8 mg Oral Once Si Gaul, MD      . vancomycin [facility]) 1 GM/200ML IVPB             SURGICAL HISTORY:  Past Surgical History  Procedure Laterality Date  . Total knee replaced  2000    Left knee  . Appendectomy    . Colonoscopy  2009  . Esophagogastroduodenoscopy  2009    mild inflammation at esophagogastric junction  . Excision of melanoma      back ( x3)  . Great toe surgery      bilateral  . Inguinal hernia repair      left, x 2  . Total knee arthroplasty  06/08/2011    Procedure: TOTAL KNEE ARTHROPLASTY;  Surgeon: Loreta Ave, MD;  Location: [facility] OR;  Service: Orthopedics;  Laterality: Right;  osteonics    REVIEW OF SYSTEMS:  A comprehensive review of systems was negative except for: Constitutional: positive for fatigue   PHYSICAL EXAMINATION: General appearance: alert, cooperative and no distress Head: Normocephalic, without obvious abnormality, atraumatic Neck: no adenopathy Lymph nodes: Cervical, supraclavicular, and axillary nodes normal. Resp: clear to auscultation bilaterally Cardio: regular rate and rhythm, S1, S2 normal, no murmur, click, rub or gallop GI: soft, non-tender; bowel sounds normal; no masses,  no organomegaly Extremities: extremities normal, atraumatic, no cyanosis or  edema Neurologic: Alert and oriented X 3, normal strength and tone. Normal symmetric reflexes. Normal coordination and gait  ECOG PERFORMANCE STATUS: 1 - Symptomatic but completely ambulatory  Blood pressure 136/72, pulse 79, temperature 96.9 F (36.1 C), temperature source Oral, resp. rate 18, height 5' 10.5" (1.791 m), weight  201 lb 12.8 oz (91.536 kg).  LABORATORY DATA: Lab Results  Component Value Date   WBC 8.4 10/30/2012   HGB 10.1* 10/30/2012   HCT 30.8* 10/30/2012   MCV 86.3 10/30/2012   PLT 249 10/30/2012      Chemistry      Component Value Date/Time   NA 136 10/30/2012 0650   NA 134* 10/22/2012 0806   K 3.7 10/30/2012 0650   K 3.5 10/22/2012 0806   CL 107 10/30/2012 0650   CL 104 10/22/2012 0806   CO2 25 10/30/2012 0650   CO2 25 10/22/2012 0806   BUN 15 10/30/2012 0650   BUN 10.4 10/22/2012 0806   CREATININE 0.92 10/30/2012 0650   CREATININE 1.1 10/22/2012 0806   CREATININE 0.98 01/18/2012 0934      Component Value Date/Time   CALCIUM 8.5 10/30/2012 0650   CALCIUM 9.2 10/22/2012 0806   ALKPHOS 61 10/22/2012 0806   ALKPHOS 48 02/20/2012 1317   AST 18 10/22/2012 0806   AST 38* 02/20/2012 1317   ALT 13 10/22/2012 0806   ALT 33 02/20/2012 1317   BILITOT 0.35 10/22/2012 0806   BILITOT 0.2* 02/20/2012 1317       RADIOGRAPHIC STUDIES: No results found.  ASSESSMENT: This is a very pleasant 72 years old white male with stage IIa multiple myeloma diagnosed in July 2013 status post treatment with Velcade, Revlimid and Decadron for total of 10 cycles with mild partial response. He is currently on treatment with Carfilzomib, Cytoxan and Decadron status post 1 weekly treatment. He is tolerating his treatment fairly well except for mild fatigue and upper respiratory congestion  PLAN: I recommended for the patient to proceed with the second week of his chemotherapy as scheduled today. Will continue to monitor his lab work on a weekly basis. I would see the patient back for followup visit in 3  weeks with the start of cycle #2. He was advised to call immediately if he has any concerning symptoms in the interval.  All questions were answered. The patient knows to call the clinic with any problems, questions or concerns. We can certainly see the patient much sooner if necessary.  I spent 15 minutes counseling the patient face to face. The total time spent in the appointment was 25 minutes.

## 2012-10-31 ENCOUNTER — Ambulatory Visit (HOSPITAL_BASED_OUTPATIENT_CLINIC_OR_DEPARTMENT_OTHER): Payer: Medicare Other

## 2012-10-31 VITALS — BP 136/60 | HR 94 | Temp 97.5°F

## 2012-10-31 DIAGNOSIS — C9000 Multiple myeloma not having achieved remission: Secondary | ICD-10-CM

## 2012-10-31 DIAGNOSIS — Z5112 Encounter for antineoplastic immunotherapy: Secondary | ICD-10-CM

## 2012-10-31 MED ORDER — SODIUM CHLORIDE 0.9 % IJ SOLN
10.0000 mL | INTRAMUSCULAR | Status: DC | PRN
  Administered 2012-10-31: 10 mL
  Filled 2012-10-31: qty 10

## 2012-10-31 MED ORDER — DEXTROSE 5 % IV SOLN
36.0000 mg/m2 | Freq: Once | INTRAVENOUS | Status: AC
  Administered 2012-10-31: 76 mg via INTRAVENOUS
  Filled 2012-10-31: qty 38

## 2012-10-31 MED ORDER — SODIUM CHLORIDE 0.9 % IV SOLN
Freq: Once | INTRAVENOUS | Status: AC
  Administered 2012-10-31: 08:00:00 via INTRAVENOUS

## 2012-10-31 MED ORDER — ONDANSETRON 8 MG/50ML IVPB (CHCC)
8.0000 mg | Freq: Once | INTRAVENOUS | Status: AC
  Administered 2012-10-31: 8 mg via INTRAVENOUS

## 2012-10-31 MED ORDER — HEPARIN SOD (PORK) LOCK FLUSH 100 UNIT/ML IV SOLN
500.0000 [IU] | Freq: Once | INTRAVENOUS | Status: AC | PRN
  Administered 2012-10-31: 500 [IU]
  Filled 2012-10-31: qty 5

## 2012-10-31 MED ORDER — DEXAMETHASONE SODIUM PHOSPHATE 10 MG/ML IJ SOLN
10.0000 mg | Freq: Once | INTRAMUSCULAR | Status: AC
  Administered 2012-10-31: 10 mg via INTRAVENOUS

## 2012-10-31 NOTE — Patient Instructions (Addendum)
[facility] Discharge Instructions for Patients Receiving Chemotherapy  Today you received the following chemotherapy agents Kyprolis.  To help prevent nausea and vomiting after your treatment, we encourage you to take your nausea medication as prescribed.   If you develop nausea and vomiting that is not controlled by your nausea medication, call the clinic. If it is after clinic hours your family physician or the after hours number for the clinic or go to the Emergency Department.   BELOW ARE SYMPTOMS THAT SHOULD BE REPORTED IMMEDIATELY:  *FEVER GREATER THAN 100.5 F  *CHILLS WITH OR WITHOUT FEVER  NAUSEA AND VOMITING THAT IS NOT CONTROLLED WITH YOUR NAUSEA MEDICATION  *UNUSUAL SHORTNESS OF BREATH  *UNUSUAL BRUISING OR BLEEDING  TENDERNESS IN MOUTH AND THROAT WITH OR WITHOUT PRESENCE OF ULCERS  *URINARY PROBLEMS  *BOWEL PROBLEMS  UNUSUAL RASH Items with * indicate a potential emergency and should be followed up as soon as possible.  Feel free to call the clinic you have any questions or concerns. The clinic phone number is [phone].   I have been informed and understand all the instructions given to me. I know to contact the clinic, my physician, or go to the Emergency Department if any problems should occur. I do not have any questions at this time, but understand that I may call the clinic during office hours   should I have any questions or need assistance in obtaining follow up care.    __________________________________________  _____________  __________ Signature of Patient or Authorized Representative            Date                   Time    __________________________________________ Nurse's Signature    

## 2012-11-01 ENCOUNTER — Ambulatory Visit: Payer: Medicare Other

## 2012-11-05 ENCOUNTER — Other Ambulatory Visit: Payer: Medicare Other | Admitting: Lab

## 2012-11-05 ENCOUNTER — Ambulatory Visit: Payer: Medicare Other

## 2012-11-05 ENCOUNTER — Ambulatory Visit (HOSPITAL_BASED_OUTPATIENT_CLINIC_OR_DEPARTMENT_OTHER): Payer: Medicare Other

## 2012-11-05 ENCOUNTER — Other Ambulatory Visit (HOSPITAL_BASED_OUTPATIENT_CLINIC_OR_DEPARTMENT_OTHER): Payer: Medicare Other | Admitting: Lab

## 2012-11-05 VITALS — BP 129/75 | HR 72 | Temp 97.8°F | Resp 20

## 2012-11-05 DIAGNOSIS — Z5111 Encounter for antineoplastic chemotherapy: Secondary | ICD-10-CM

## 2012-11-05 DIAGNOSIS — C9000 Multiple myeloma not having achieved remission: Secondary | ICD-10-CM

## 2012-11-05 DIAGNOSIS — Z5112 Encounter for antineoplastic immunotherapy: Secondary | ICD-10-CM

## 2012-11-05 LAB — COMPREHENSIVE METABOLIC PANEL (CC13)
ALT: 17 U/L (ref 0–55)
Albumin: 2.6 g/dL — ABNORMAL LOW (ref 3.5–5.0)
CO2: 25 mEq/L (ref 22–29)
Calcium: 8.6 mg/dL (ref 8.4–10.4)
Chloride: 104 mEq/L (ref 98–107)
Creatinine: 1 mg/dL (ref 0.7–1.3)
Potassium: 3.9 mEq/L (ref 3.5–5.1)
Total Protein: 9.1 g/dL — ABNORMAL HIGH (ref 6.4–8.3)

## 2012-11-05 LAB — CBC WITH DIFFERENTIAL/PLATELET
BASO%: 0.1 % (ref 0.0–2.0)
HCT: 33 % — ABNORMAL LOW (ref 38.4–49.9)
HGB: 11 g/dL — ABNORMAL LOW (ref 13.0–17.1)
MCHC: 33.2 g/dL (ref 32.0–36.0)
MONO#: 0 10*3/uL — ABNORMAL LOW (ref 0.1–0.9)
NEUT%: 91.5 % — ABNORMAL HIGH (ref 39.0–75.0)
RDW: 19.5 % — ABNORMAL HIGH (ref 11.0–14.6)
WBC: 3.8 10*3/uL — ABNORMAL LOW (ref 4.0–10.3)
lymph#: 0.3 10*3/uL — ABNORMAL LOW (ref 0.9–3.3)

## 2012-11-05 MED ORDER — HEPARIN SOD (PORK) LOCK FLUSH 100 UNIT/ML IV SOLN
500.0000 [IU] | Freq: Once | INTRAVENOUS | Status: AC | PRN
  Administered 2012-11-05: 500 [IU]
  Filled 2012-11-05: qty 5

## 2012-11-05 MED ORDER — ONDANSETRON 8 MG/50ML IVPB (CHCC)
8.0000 mg | Freq: Once | INTRAVENOUS | Status: AC
  Administered 2012-11-05: 8 mg via INTRAVENOUS

## 2012-11-05 MED ORDER — SODIUM CHLORIDE 0.9 % IV SOLN
300.0000 mg/m2 | Freq: Once | INTRAVENOUS | Status: AC
  Administered 2012-11-05: 640 mg via INTRAVENOUS
  Filled 2012-11-05: qty 32

## 2012-11-05 MED ORDER — SODIUM CHLORIDE 0.9 % IJ SOLN
10.0000 mL | INTRAMUSCULAR | Status: DC | PRN
  Administered 2012-11-05: 10 mL
  Filled 2012-11-05: qty 10

## 2012-11-05 MED ORDER — DEXAMETHASONE SODIUM PHOSPHATE 10 MG/ML IJ SOLN
10.0000 mg | Freq: Once | INTRAMUSCULAR | Status: AC
  Administered 2012-11-05: 10 mg via INTRAVENOUS

## 2012-11-05 MED ORDER — SODIUM CHLORIDE 0.9 % IV SOLN
Freq: Once | INTRAVENOUS | Status: AC
  Administered 2012-11-05: 15:00:00 via INTRAVENOUS

## 2012-11-05 MED ORDER — DEXTROSE 5 % IV SOLN
36.0000 mg/m2 | Freq: Once | INTRAVENOUS | Status: AC
  Administered 2012-11-05: 76 mg via INTRAVENOUS
  Filled 2012-11-05: qty 38

## 2012-11-05 MED ORDER — SODIUM CHLORIDE 0.9 % IV SOLN
Freq: Once | INTRAVENOUS | Status: AC
  Administered 2012-11-05: 16:00:00 via INTRAVENOUS

## 2012-11-05 NOTE — Patient Instructions (Addendum)
[facility] Discharge Instructions for Patients Receiving Chemotherapy  Today you received the following chemotherapy agents :  Kyprolis,  Cytoxan.  To help prevent nausea and vomiting after your treatment, we encourage you to take your nausea medication as instructed by your physician.    If you develop nausea and vomiting that is not controlled by your nausea medication, call the clinic. If it is after clinic hours your family physician or the after hours number for the clinic or go to the Emergency Department.   BELOW ARE SYMPTOMS THAT SHOULD BE REPORTED IMMEDIATELY:  *FEVER GREATER THAN 100.5 F  *CHILLS WITH OR WITHOUT FEVER  NAUSEA AND VOMITING THAT IS NOT CONTROLLED WITH YOUR NAUSEA MEDICATION  *UNUSUAL SHORTNESS OF BREATH  *UNUSUAL BRUISING OR BLEEDING  TENDERNESS IN MOUTH AND THROAT WITH OR WITHOUT PRESENCE OF ULCERS  *URINARY PROBLEMS  *BOWEL PROBLEMS  UNUSUAL RASH Items with * indicate a potential emergency and should be followed up as soon as possible.  One of the nurses will contact you 24 hours after your treatment. Please let the nurse know about any problems that you may have experienced. Feel free to call the clinic you have any questions or concerns. The clinic phone number is [phone].   I have been informed and understand all the instructions given to me. I know to contact the clinic, my physician, or go to the Emergency Department if any problems should occur. I do not have any questions at this time, but understand that I may call the clinic during office hours   should I have any questions or need assistance in obtaining follow up care.    __________________________________________  _____________  __________ Signature of Patient or Authorized Representative            Date                   Time    __________________________________________ Nurse's Signature

## 2012-11-06 ENCOUNTER — Telehealth: Payer: Self-pay | Admitting: *Deleted

## 2012-11-06 ENCOUNTER — Ambulatory Visit (HOSPITAL_BASED_OUTPATIENT_CLINIC_OR_DEPARTMENT_OTHER): Payer: Medicare Other

## 2012-11-06 VITALS — BP 128/76 | HR 80 | Temp 97.0°F

## 2012-11-06 DIAGNOSIS — C9000 Multiple myeloma not having achieved remission: Secondary | ICD-10-CM

## 2012-11-06 DIAGNOSIS — Z5112 Encounter for antineoplastic immunotherapy: Secondary | ICD-10-CM

## 2012-11-06 MED ORDER — ONDANSETRON 8 MG/50ML IVPB (CHCC)
8.0000 mg | Freq: Once | INTRAVENOUS | Status: AC
  Administered 2012-11-06: 8 mg via INTRAVENOUS

## 2012-11-06 MED ORDER — DEXTROSE 5 % IV SOLN
36.0000 mg/m2 | Freq: Once | INTRAVENOUS | Status: AC
  Administered 2012-11-06: 76 mg via INTRAVENOUS
  Filled 2012-11-06: qty 38

## 2012-11-06 MED ORDER — SODIUM CHLORIDE 0.9 % IJ SOLN
10.0000 mL | INTRAMUSCULAR | Status: DC | PRN
  Administered 2012-11-06: 10 mL
  Filled 2012-11-06: qty 10

## 2012-11-06 MED ORDER — HEPARIN SOD (PORK) LOCK FLUSH 100 UNIT/ML IV SOLN
500.0000 [IU] | Freq: Once | INTRAVENOUS | Status: AC | PRN
  Administered 2012-11-06: 500 [IU]
  Filled 2012-11-06: qty 5

## 2012-11-06 MED ORDER — DEXAMETHASONE SODIUM PHOSPHATE 10 MG/ML IJ SOLN
10.0000 mg | Freq: Once | INTRAMUSCULAR | Status: AC
  Administered 2012-11-06: 10 mg via INTRAVENOUS

## 2012-11-06 MED ORDER — SODIUM CHLORIDE 0.9 % IV SOLN
Freq: Once | INTRAVENOUS | Status: AC
  Administered 2012-11-06: 09:00:00 via INTRAVENOUS

## 2012-11-06 MED ORDER — SODIUM CHLORIDE 0.9 % IV SOLN
Freq: Once | INTRAVENOUS | Status: AC
  Administered 2012-11-06: 08:00:00 via INTRAVENOUS

## 2012-11-06 NOTE — Patient Instructions (Signed)
[facility] Discharge Instructions for Patients Receiving Chemotherapy  Today you received the following chemotherapy agents Kyprolis  To help prevent nausea and vomiting after your treatment, we encourage you to take your nausea medication as prescribed.   If you develop nausea and vomiting that is not controlled by your nausea medication, call the clinic. If it is after clinic hours your family physician or the after hours number for the clinic or go to the Emergency Department.   BELOW ARE SYMPTOMS THAT SHOULD BE REPORTED IMMEDIATELY:  *FEVER GREATER THAN 100.5 F  *CHILLS WITH OR WITHOUT FEVER  NAUSEA AND VOMITING THAT IS NOT CONTROLLED WITH YOUR NAUSEA MEDICATION  *UNUSUAL SHORTNESS OF BREATH  *UNUSUAL BRUISING OR BLEEDING  TENDERNESS IN MOUTH AND THROAT WITH OR WITHOUT PRESENCE OF ULCERS  *URINARY PROBLEMS  *BOWEL PROBLEMS  UNUSUAL RASH Items with * indicate a potential emergency and should be followed up as soon as possible.  One of the nurses will contact you 24 hours after your treatment. Please let the nurse know about any problems that you may have experienced. Feel free to call the clinic you have any questions or concerns. The clinic phone number is [phone].

## 2012-11-06 NOTE — Telephone Encounter (Signed)
Pt in lobby with questions.  Pt wanted to know whether he could start taking a baby aspirin, he has had some bilateral LE swelling in his feet, no redness or pain noted.  Pt also states some new neuropathy in hands.  Per Dr Donnald Garre, pt may start a baby ASA 81mg , pt needs to elevate feet in the evenings, and will continue to monitor neuropathy.  Pt verbalized understanding.  SLJ

## 2012-11-08 ENCOUNTER — Ambulatory Visit: Payer: Medicare Other

## 2012-11-21 ENCOUNTER — Other Ambulatory Visit (HOSPITAL_BASED_OUTPATIENT_CLINIC_OR_DEPARTMENT_OTHER): Payer: Medicare Other | Admitting: Lab

## 2012-11-21 ENCOUNTER — Other Ambulatory Visit: Payer: Self-pay | Admitting: *Deleted

## 2012-11-21 ENCOUNTER — Ambulatory Visit (HOSPITAL_BASED_OUTPATIENT_CLINIC_OR_DEPARTMENT_OTHER): Payer: Medicare Other

## 2012-11-21 ENCOUNTER — Other Ambulatory Visit: Payer: Self-pay | Admitting: Physician Assistant

## 2012-11-21 VITALS — BP 119/73 | HR 70 | Temp 97.4°F | Resp 20

## 2012-11-21 DIAGNOSIS — C9000 Multiple myeloma not having achieved remission: Secondary | ICD-10-CM

## 2012-11-21 DIAGNOSIS — Z5112 Encounter for antineoplastic immunotherapy: Secondary | ICD-10-CM

## 2012-11-21 DIAGNOSIS — Z5111 Encounter for antineoplastic chemotherapy: Secondary | ICD-10-CM

## 2012-11-21 LAB — COMPREHENSIVE METABOLIC PANEL (CC13)
AST: 40 U/L — ABNORMAL HIGH (ref 5–34)
Alkaline Phosphatase: 63 U/L (ref 40–150)
BUN: 15.2 mg/dL (ref 7.0–26.0)
Creatinine: 1.1 mg/dL (ref 0.7–1.3)
Glucose: 132 mg/dl — ABNORMAL HIGH (ref 70–99)
Total Bilirubin: 0.35 mg/dL (ref 0.20–1.20)

## 2012-11-21 LAB — CBC WITH DIFFERENTIAL/PLATELET
Basophils Absolute: 0.1 10*3/uL (ref 0.0–0.1)
EOS%: 1.9 % (ref 0.0–7.0)
HCT: 33.6 % — ABNORMAL LOW (ref 38.4–49.9)
LYMPH%: 26.7 % (ref 14.0–49.0)
MCHC: 33.1 g/dL (ref 32.0–36.0)
MONO#: 0.5 10*3/uL (ref 0.1–0.9)
MONO%: 10 % (ref 0.0–14.0)
RBC: 3.81 10*6/uL — ABNORMAL LOW (ref 4.20–5.82)
RDW: 21.5 % — ABNORMAL HIGH (ref 11.0–14.6)
WBC: 4.9 10*3/uL (ref 4.0–10.3)
lymph#: 1.3 10*3/uL (ref 0.9–3.3)

## 2012-11-21 MED ORDER — SODIUM CHLORIDE 0.9 % IV SOLN
Freq: Once | INTRAVENOUS | Status: AC
  Administered 2012-11-21: 09:00:00 via INTRAVENOUS

## 2012-11-21 MED ORDER — HEPARIN SOD (PORK) LOCK FLUSH 100 UNIT/ML IV SOLN
500.0000 [IU] | Freq: Once | INTRAVENOUS | Status: AC | PRN
  Administered 2012-11-21: 500 [IU]
  Filled 2012-11-21: qty 5

## 2012-11-21 MED ORDER — DEXAMETHASONE SODIUM PHOSPHATE 10 MG/ML IJ SOLN
10.0000 mg | Freq: Once | INTRAMUSCULAR | Status: AC
  Administered 2012-11-21: 10 mg via INTRAVENOUS

## 2012-11-21 MED ORDER — ZOLEDRONIC ACID 4 MG/100ML IV SOLN
4.0000 mg | Freq: Once | INTRAVENOUS | Status: AC
  Administered 2012-11-21: 4 mg via INTRAVENOUS
  Filled 2012-11-21: qty 100

## 2012-11-21 MED ORDER — SODIUM CHLORIDE 0.9 % IJ SOLN
10.0000 mL | INTRAMUSCULAR | Status: DC | PRN
  Administered 2012-11-21: 10 mL
  Filled 2012-11-21: qty 10

## 2012-11-21 MED ORDER — ONDANSETRON 8 MG/50ML IVPB (CHCC)
8.0000 mg | Freq: Once | INTRAVENOUS | Status: AC
  Administered 2012-11-21: 8 mg via INTRAVENOUS

## 2012-11-21 MED ORDER — SODIUM CHLORIDE 0.9 % IV SOLN
300.0000 mg/m2 | Freq: Once | INTRAVENOUS | Status: AC
  Administered 2012-11-21: 640 mg via INTRAVENOUS
  Filled 2012-11-21: qty 32

## 2012-11-21 MED ORDER — DEXTROSE 5 % IV SOLN
36.0000 mg/m2 | Freq: Once | INTRAVENOUS | Status: AC
  Administered 2012-11-21: 76 mg via INTRAVENOUS
  Filled 2012-11-21: qty 38

## 2012-11-21 MED ORDER — TEMAZEPAM 30 MG PO CAPS: 30.0000 mg | ORAL_CAPSULE | Freq: Every evening | ORAL | Status: DC | PRN

## 2012-11-21 NOTE — Patient Instructions (Addendum)
[facility] Discharge Instructions for Patients Receiving Chemotherapy  Today you received the following chemotherapy agents Kyprolis, Cytoxan, Zometa   If you develop nausea and vomiting that is not controlled by your nausea medication, call the clinic. If it is after clinic hours your family physician or the after hours number for the clinic or go to the Emergency Department.   BELOW ARE SYMPTOMS THAT SHOULD BE REPORTED IMMEDIATELY:  *FEVER GREATER THAN 100.5 F  *CHILLS WITH OR WITHOUT FEVER  NAUSEA AND VOMITING THAT IS NOT CONTROLLED WITH YOUR NAUSEA MEDICATION  *UNUSUAL SHORTNESS OF BREATH  *UNUSUAL BRUISING OR BLEEDING  TENDERNESS IN MOUTH AND THROAT WITH OR WITHOUT PRESENCE OF ULCERS  *URINARY PROBLEMS  *BOWEL PROBLEMS  UNUSUAL RASH Items with * indicate a potential emergency and should be followed up as soon as possible.  One of the nurses will contact you 24 hours after your treatment. Please let the nurse know about any problems that you may have experienced. Feel free to call the clinic you have any questions or concerns. The clinic phone number is [phone].   I have been informed and understand all the instructions given to me. I know to contact the clinic, my physician, or go to the Emergency Department if any problems should occur. I do not have any questions at this time, but understand that I may call the clinic during office hours   should I have any questions or need assistance in obtaining follow up care.    __________________________________________  _____________  __________ Signature of Patient or Authorized Representative            Date                   Time    __________________________________________ Nurse's Signature

## 2012-11-22 ENCOUNTER — Ambulatory Visit (HOSPITAL_BASED_OUTPATIENT_CLINIC_OR_DEPARTMENT_OTHER): Payer: Medicare Other | Admitting: Internal Medicine

## 2012-11-22 ENCOUNTER — Telehealth: Payer: Self-pay | Admitting: *Deleted

## 2012-11-22 ENCOUNTER — Other Ambulatory Visit: Payer: Self-pay | Admitting: Medical Oncology

## 2012-11-22 ENCOUNTER — Ambulatory Visit: Payer: Medicare Other

## 2012-11-22 ENCOUNTER — Telehealth: Payer: Self-pay | Admitting: Internal Medicine

## 2012-11-22 ENCOUNTER — Encounter: Payer: Self-pay | Admitting: Internal Medicine

## 2012-11-22 ENCOUNTER — Ambulatory Visit (HOSPITAL_BASED_OUTPATIENT_CLINIC_OR_DEPARTMENT_OTHER): Payer: Medicare Other

## 2012-11-22 VITALS — BP 131/86 | HR 73 | Temp 98.0°F | Resp 18

## 2012-11-22 VITALS — BP 126/62 | HR 89 | Temp 98.1°F | Resp 18 | Ht 70.0 in | Wt 203.2 lb

## 2012-11-22 DIAGNOSIS — C9000 Multiple myeloma not having achieved remission: Secondary | ICD-10-CM

## 2012-11-22 DIAGNOSIS — Z5112 Encounter for antineoplastic immunotherapy: Secondary | ICD-10-CM

## 2012-11-22 MED ORDER — ONDANSETRON 8 MG/50ML IVPB (CHCC)
8.0000 mg | Freq: Once | INTRAVENOUS | Status: AC
  Administered 2012-11-22: 8 mg via INTRAVENOUS

## 2012-11-22 MED ORDER — DEXAMETHASONE SODIUM PHOSPHATE 10 MG/ML IJ SOLN
10.0000 mg | Freq: Once | INTRAMUSCULAR | Status: AC
  Administered 2012-11-22: 10 mg via INTRAVENOUS

## 2012-11-22 MED ORDER — TEMAZEPAM 30 MG PO CAPS: 30.0000 mg | ORAL_CAPSULE | Freq: Every evening | ORAL | Status: DC | PRN

## 2012-11-22 MED ORDER — SODIUM CHLORIDE 0.9 % IJ SOLN
10.0000 mL | INTRAMUSCULAR | Status: DC | PRN
  Administered 2012-11-22: 10 mL
  Filled 2012-11-22: qty 10

## 2012-11-22 MED ORDER — SODIUM CHLORIDE 0.9 % IV SOLN
Freq: Once | INTRAVENOUS | Status: AC
  Administered 2012-11-22: 10:00:00 via INTRAVENOUS

## 2012-11-22 MED ORDER — HEPARIN SOD (PORK) LOCK FLUSH 100 UNIT/ML IV SOLN
500.0000 [IU] | Freq: Once | INTRAVENOUS | Status: AC | PRN
  Administered 2012-11-22: 500 [IU]
  Filled 2012-11-22: qty 5

## 2012-11-22 MED ORDER — DEXTROSE 5 % IV SOLN
36.0000 mg/m2 | Freq: Once | INTRAVENOUS | Status: AC
  Administered 2012-11-22: 76 mg via INTRAVENOUS
  Filled 2012-11-22: qty 38

## 2012-11-22 NOTE — Telephone Encounter (Signed)
Refilled restoril 

## 2012-11-22 NOTE — Telephone Encounter (Signed)
Per staff message and POF I have scheduled appts.  JMW  

## 2012-11-22 NOTE — Progress Notes (Signed)
[facility] Telephone:(336) [phone]   Fax:(336) [phone]  OFFICE PROGRESS NOTE  [name],[name], MD [address] [place] [address]  DIAGNOSIS: Stage IIA multiple myeloma diagnosed in July of 2013  PRIOR THERAPY:  1) Systemic chemotherapy with subcutaneous Velcade 1.3 mg/M2 on days 1, 4, 8 and 11 every 3 weeks in addition to Revlimid 25 mg by mouth daily for 14 days every 3 weeks and dexamethasone 40 mg on a weekly basis. Status post 4 planned cycles.  2) Systemic chemotherapy with subcutaneous Velcade 1.3 mg/M2 on days 1, 4, 8 and 11 every 3 weeks in addition to Revlimid 25 mg by mouth daily for 14 days every 3 weeks and dexamethasone 40 mg on a weekly basis. 4 more cycles are planned and he is status post 4 cycles of the second set of 4 cycles, now status post a total of 10 cycles  CURRENT THERAPY:  1. Systemic chemotherapy with Carfilzomib 36 mg/M2 on days 1, 2, 8, 9, 15 and 16 in addition to cyclophosphamide 300 mg/M2 IV on days 1, 8 and 15 as well as dexamethasone on days 1, 8, 15 and 22 every 4 weeks.  2. Zometa 4 mg IV given a monthly basis for bone disease   INTERVAL HISTORY: [name] 72 y.o. male returns to the clinic today for followup visit. The patient is feeling fine today with no specific complaints except for occasional cramps his hands started yesterday and lasted for a few minutes. The patient also complains of lack of sleep especially after his treatment with steroids. He denied having any significant chest pain, shortness breath, cough or hemoptysis. He has no significant weight loss or night sweats. The patient came back home from trip to [place]. He is here to start the second cycle of his chemotherapy with Carfilzomib, cyclophosphamide and dexamethasone. He is tolerating his treatment well so far. His comprehensive metabolic panel today showed decrease In the serum total protein which may be an indication for good response. He is  scheduled to see Dr. Lance Bosch first week of June for reevaluation with the autologous peripheral blood stem cell transplant.   MEDICAL HISTORY: Past Medical History  Diagnosis Date  . GERD (gastroesophageal reflux disease)   . Adenomatous polyp of colon 1996  . Arthritis     Osteoarthritis right knee, spine, wrist  . Benign prostatic hypertrophy     (Dr. Vernie Ammons)  . History of chronic prostatitis   . History of melanoma     Back (Dr. Karlyn Agee); early melanoma R arm 03/2011  . Diverticulosis   . Arthritis of hand, right     Right thumb (Dr. Teressa Senter)  . Degenerative disc disease   . Hemorrhoids   . Erosive esophagitis   . Back ache     r/o Multiple Myeloma  . Multiple myeloma 2013    ALLERGIES:  is allergic to penicillins.  MEDICATIONS:  Current Outpatient Prescriptions  Medication Sig Dispense Refill  . acyclovir (ZOVIRAX) 400 MG tablet Take 1 tablet (400 mg total) by mouth 2 (two) times daily.  60 tablet  5  . ascorbic acid (VITAMIN C) 500 MG tablet Take 500 mg by mouth daily.      Marland Kitchen aspirin EC 81 MG tablet Take 81 mg by mouth every other day.      . B Complex-C (B-COMPLEX WITH VITAMIN C) tablet Take 1 tablet by mouth daily.      . cholecalciferol (VITAMIN D) 1000 UNITS tablet  Take 1,000 Units by mouth daily.      Marland Kitchen dexamethasone (DECADRON) 4 MG tablet Take 10 tablets (40 mg total) by mouth once a week. Takes on Monday am.  120 tablet  0  . fish oil-omega-3 fatty acids 1000 MG capsule Take 1 g by mouth daily.       Marland Kitchen HYDROcodone-acetaminophen (NORCO) 7.5-325 MG per tablet Take 1 tablet by mouth every 6 (six) hours as needed for pain.       Marland Kitchen lidocaine-prilocaine (EMLA) cream Apply topically as needed. Apply to the port 1 hour before chemo  30 g  0  . Multiple Vitamin (MULTIVITAMIN) capsule Take 1 capsule by mouth daily.      . pantoprazole (PROTONIX) 40 MG tablet Take 1 tablet (40 mg total) by mouth 2 (two) times daily.  60 tablet  0  . potassium chloride SA (K-DUR,KLOR-CON) 20  MEQ tablet Take 20 mEq by mouth daily.      . Probiotic Product (ALIGN) 4 MG CAPS Take 1 capsule by mouth daily.      . prochlorperazine (COMPAZINE) 10 MG tablet Take 1 tablet (10 mg total) by mouth every 6 (six) hours as needed. Nausea  30 tablet  1  . temazepam (RESTORIL) 30 MG capsule Take 1 capsule (30 mg total) by mouth at bedtime as needed for sleep.  30 capsule  0  . zolendronic acid (ZOMETA) 4 MG/5ML injection Inject 4 mg into the vein every 28 (twenty-eight) days.      . CELEBREX 200 MG capsule Take 200 mg by mouth daily as needed for pain.        No current facility-administered medications for this visit.   Facility-Administered Medications Ordered in Other Visits  Medication Dose Route Frequency Provider Last Rate Last Dose  . ondansetron (ZOFRAN) tablet 8 mg  8 mg Oral Once Si Gaul, MD        SURGICAL HISTORY:  Past Surgical History  Procedure Laterality Date  . Total knee replaced  2000    Left knee  . Appendectomy    . Colonoscopy  2009  . Esophagogastroduodenoscopy  2009    mild inflammation at esophagogastric junction  . Excision of melanoma      back ( x3)  . Great toe surgery      bilateral  . Inguinal hernia repair      left, x 2  . Total knee arthroplasty  06/08/2011    Procedure: TOTAL KNEE ARTHROPLASTY;  Surgeon: Loreta Ave, MD;  Location: [facility] OR;  Service: Orthopedics;  Laterality: Right;  osteonics    REVIEW OF SYSTEMS:  A comprehensive review of systems was negative.   PHYSICAL EXAMINATION: General appearance: alert, cooperative and no distress Head: Normocephalic, without obvious abnormality, atraumatic Neck: no adenopathy Lymph nodes: Cervical, supraclavicular, and axillary nodes normal. Resp: clear to auscultation bilaterally Cardio: regular rate and rhythm, S1, S2 normal, no murmur, click, rub or gallop GI: soft, non-tender; bowel sounds normal; no masses,  no organomegaly Extremities: extremities normal, atraumatic, no cyanosis or  edema Neurologic: Alert and oriented X 3, normal strength and tone. Normal symmetric reflexes. Normal coordination and gait  ECOG PERFORMANCE STATUS: 0 - Asymptomatic  Blood pressure 126/62, pulse 89, temperature 98.1 F (36.7 C), temperature source Oral, resp. rate 18, height 5\' 10"  (1.778 m), weight 203 lb 3.2 oz (92.171 kg).  LABORATORY DATA: Lab Results  Component Value Date   WBC 4.9 11/21/2012   HGB 11.1* 11/21/2012   HCT 33.6* 11/21/2012  MCV 88.1 11/21/2012   PLT 314 11/21/2012      Chemistry      Component Value Date/Time   NA 140 11/21/2012 0745   NA 136 10/30/2012 0650   K 3.6 11/21/2012 0745   K 3.7 10/30/2012 0650   CL 108* 11/21/2012 0745   CL 107 10/30/2012 0650   CO2 27 11/21/2012 0745   CO2 25 10/30/2012 0650   BUN 15.2 11/21/2012 0745   BUN 15 10/30/2012 0650   CREATININE 1.1 11/21/2012 0745   CREATININE 0.92 10/30/2012 0650   CREATININE 0.98 01/18/2012 0934      Component Value Date/Time   CALCIUM 8.6 11/21/2012 0745   CALCIUM 8.5 10/30/2012 0650   ALKPHOS 63 11/21/2012 0745   ALKPHOS 48 02/20/2012 1317   AST 40* 11/21/2012 0745   AST 38* 02/20/2012 1317   ALT 49 11/21/2012 0745   ALT 33 02/20/2012 1317   BILITOT 0.35 11/21/2012 0745   BILITOT 0.2* 02/20/2012 1317       ASSESSMENT: This is a very pleasant 72 years old white male with history of multiple myeloma currently on treatment with Carfilzomib, cyclophosphamide and Decadron status post 1 cycle. The patient is doing fine and tolerating his treatment fairly well.   PLAN: We'll proceed with cycle #2 as scheduled.  The patient come back for followup visit in 3 weeks with repeat CBC, comprehensive metabolic panel, LDH and myeloma panel. For insomnia, the patient was given a refill of history 30 mg by mouth each bedtime.  All questions were answered. The patient knows to call the clinic with any problems, questions or concerns. We can certainly see the patient much sooner if necessary.  I spent 15 minutes  counseling the patient face to face. The total time spent in the appointment was 25 minutes.

## 2012-11-22 NOTE — Patient Instructions (Signed)
Continue treatment with chemotherapy today as scheduled Followup in 3 weeks with repeat myeloma panel

## 2012-11-22 NOTE — Telephone Encounter (Signed)
Gave pt apt for lab, MD and chemo for May, June and July 2014

## 2012-11-22 NOTE — Patient Instructions (Addendum)
Carfilzomib injection What is this medicine? CARFILZOMIB is a chemotherapy drug that works by slowing or stopping cancer cell growth. This medicine is used to treat multiple myeloma. This medicine may be used for other purposes; ask your health care provider or pharmacist if you have questions. What should I tell my health care provider before I take this medicine? They need to know if you have any of these conditions: -heart disease -irregular heartbeat -liver disease -lung or breathing disease -an unusual or allergic reaction to carfilzomib, or other medicines, foods, dyes, or preservatives -pregnant or trying to get pregnant -breast-feeding How should I use this medicine? This medicine is for injection or infusion into a vein. It is given by a health care professional in a hospital or clinic setting. Talk to your pediatrician regarding the use of this medicine in children. Special care may be needed. Overdosage: If you think you've taken too much of this medicine contact a poison control center or emergency room at once. Overdosage: If you think you have taken too much of this medicine contact a poison control center or emergency room at once. NOTE: This medicine is only for you. Do not share this medicine with others. What if I miss a dose? It is important not to miss your dose. Call your doctor or health care professional if you are unable to keep an appointment. What may interact with this medicine? Interactions are not expected. Give your health care provider a list of all the medicines, herbs, non-prescription drugs, or dietary supplements you use. Also tell them if you smoke, drink alcohol, or use illegal drugs. Some items may interact with your medicine. This list may not describe all possible interactions. Give your health care provider a list of all the medicines, herbs, non-prescription drugs, or dietary supplements you use. Also tell them if you smoke, drink alcohol, or use  illegal drugs. Some items may interact with your medicine. What should I watch for while using this medicine? Your condition will be monitored carefully while you are receiving this medicine. Report any side effects. Continue your course of treatment even though you feel ill unless your doctor tells you to stop. Call your doctor or health care professional for advice if you get a fever, chills or sore throat, or other symptoms of a cold or flu. Do not treat yourself. Try to avoid being around people who are sick. Do not become pregnant while taking this medicine. Women should inform their doctor if they wish to become pregnant or think they might be pregnant. There is a potential for serious side effects to an unborn child. Talk to your health care professional or pharmacist for more information. Do not breast-feed an infant while taking this medicine. Check with your doctor or health care professional if you get an attack of severe diarrhea, nausea and vomiting, or if you sweat a lot. The loss of too much body fluid can make it dangerous for you to take this medicine. You may get dizzy. Do not drive, use machinery, or do anything that needs mental alertness until you know how this medicine affects you. Do not stand or sit up quickly, especially if you are an older patient. This reduces the risk of dizzy or fainting spells. What side effects may I notice from receiving this medicine? Side effects that you should report to your doctor or health care professional as soon as possible: -allergic reactions like skin rash, itching or hives, swelling of the face, lips, or tongue -breathing   problems -chest pain or palpitationschest tightness -cough -dark urine -dizziness -feeling faint or lightheaded -fever or chills -general ill feeling or flu-like symptoms -light-colored stools -palpitations -right upper belly pain -swelling of the legs or ankles -unusual bleeding or bruising -unusually weak or  tired -yellowing of the eyes or skin  Side effects that usually do not require medical attention (Report these to your doctor or health care professional if they continue or are bothersome.): -diarrhea -headache -nausea, vomiting -tiredness This list may not describe all possible side effects. Call your doctor for medical advice about side effects. You may report side effects to FDA at 1-800-FDA-1088. Where should I keep my medicine? This drug is given in a hospital or clinic and will not be stored at home. NOTE: This sheet is a summary. It may not cover all possible information. If you have questions about this medicine, talk to your doctor, pharmacist, or health care provider.  2013, [employer]. (06/16/2011 4:16:30 PM)  

## 2012-11-26 ENCOUNTER — Other Ambulatory Visit (HOSPITAL_BASED_OUTPATIENT_CLINIC_OR_DEPARTMENT_OTHER): Payer: Medicare Other | Admitting: Lab

## 2012-11-26 ENCOUNTER — Ambulatory Visit (HOSPITAL_BASED_OUTPATIENT_CLINIC_OR_DEPARTMENT_OTHER): Payer: Medicare Other

## 2012-11-26 VITALS — BP 131/78 | HR 63 | Temp 98.0°F | Resp 20

## 2012-11-26 DIAGNOSIS — C9000 Multiple myeloma not having achieved remission: Secondary | ICD-10-CM

## 2012-11-26 DIAGNOSIS — Z5112 Encounter for antineoplastic immunotherapy: Secondary | ICD-10-CM

## 2012-11-26 DIAGNOSIS — Z5111 Encounter for antineoplastic chemotherapy: Secondary | ICD-10-CM

## 2012-11-26 LAB — COMPREHENSIVE METABOLIC PANEL (CC13)
ALT: 28 U/L (ref 0–55)
AST: 18 U/L (ref 5–34)
Albumin: 2.7 g/dL — ABNORMAL LOW (ref 3.5–5.0)
Calcium: 9.1 mg/dL (ref 8.4–10.4)
Chloride: 105 mEq/L (ref 98–107)
Potassium: 3.9 mEq/L (ref 3.5–5.1)
Sodium: 138 mEq/L (ref 136–145)
Total Protein: 7.3 g/dL (ref 6.4–8.3)

## 2012-11-26 LAB — CBC WITH DIFFERENTIAL/PLATELET
BASO%: 0.8 % (ref 0.0–2.0)
Basophils Absolute: 0 10*3/uL (ref 0.0–0.1)
EOS%: 3.7 % (ref 0.0–7.0)
HGB: 11.6 g/dL — ABNORMAL LOW (ref 13.0–17.1)
MCH: 29.4 pg (ref 27.2–33.4)
MCHC: 33.2 g/dL (ref 32.0–36.0)
RDW: 21 % — ABNORMAL HIGH (ref 11.0–14.6)
lymph#: 0.9 10*3/uL (ref 0.9–3.3)

## 2012-11-26 MED ORDER — SODIUM CHLORIDE 0.9 % IV SOLN
300.0000 mg/m2 | Freq: Once | INTRAVENOUS | Status: AC
  Administered 2012-11-26: 640 mg via INTRAVENOUS
  Filled 2012-11-26: qty 32

## 2012-11-26 MED ORDER — ONDANSETRON 8 MG/50ML IVPB (CHCC)
8.0000 mg | Freq: Once | INTRAVENOUS | Status: AC
  Administered 2012-11-26: 8 mg via INTRAVENOUS

## 2012-11-26 MED ORDER — DEXTROSE 5 % IV SOLN
36.0000 mg/m2 | Freq: Once | INTRAVENOUS | Status: AC
  Administered 2012-11-26: 76 mg via INTRAVENOUS
  Filled 2012-11-26: qty 38

## 2012-11-26 MED ORDER — SODIUM CHLORIDE 0.9 % IJ SOLN
10.0000 mL | INTRAMUSCULAR | Status: DC | PRN
  Administered 2012-11-26: 10 mL
  Filled 2012-11-26: qty 10

## 2012-11-26 MED ORDER — SODIUM CHLORIDE 0.9 % IV SOLN
Freq: Once | INTRAVENOUS | Status: AC
  Administered 2012-11-26: 09:00:00 via INTRAVENOUS

## 2012-11-26 MED ORDER — DEXAMETHASONE SODIUM PHOSPHATE 10 MG/ML IJ SOLN
10.0000 mg | Freq: Once | INTRAMUSCULAR | Status: AC
  Administered 2012-11-26: 10 mg via INTRAVENOUS

## 2012-11-26 MED ORDER — SODIUM CHLORIDE 0.9 % IV SOLN: Freq: Once | INTRAVENOUS | Status: DC

## 2012-11-26 MED ORDER — HEPARIN SOD (PORK) LOCK FLUSH 100 UNIT/ML IV SOLN
500.0000 [IU] | Freq: Once | INTRAVENOUS | Status: AC | PRN
  Administered 2012-11-26: 500 [IU]
  Filled 2012-11-26: qty 5

## 2012-11-26 NOTE — Patient Instructions (Signed)
[facility] Discharge Instructions for Patients Receiving Chemotherapy  Today you received the following chemotherapy agents: Cytoxan and Kyprolis. To help prevent nausea and vomiting after your treatment, we encourage you to take your nausea medication, Compazine. Take it every six hours as needed for nausea.   If you develop nausea and vomiting that is not controlled by your nausea medication, call the clinic. If it is after clinic hours your family physician or the after hours number for the clinic or go to the Emergency Department.   BELOW ARE SYMPTOMS THAT SHOULD BE REPORTED IMMEDIATELY:  *FEVER GREATER THAN 100.5 F  *CHILLS WITH OR WITHOUT FEVER  NAUSEA AND VOMITING THAT IS NOT CONTROLLED WITH YOUR NAUSEA MEDICATION  *UNUSUAL SHORTNESS OF BREATH  *UNUSUAL BRUISING OR BLEEDING  TENDERNESS IN MOUTH AND THROAT WITH OR WITHOUT PRESENCE OF ULCERS  *URINARY PROBLEMS  *BOWEL PROBLEMS  UNUSUAL RASH Items with * indicate a potential emergency and should be followed up as soon as possible.  Feel free to call the clinic you have any questions or concerns. The clinic phone number is [phone].   I have been informed and understand all the instructions given to me. I know to contact the clinic, my physician, or go to the Emergency Department if any problems should occur. I do not have any questions at this time, but understand that I may call the clinic during office hours   should I have any questions or need assistance in obtaining follow up care.

## 2012-11-27 ENCOUNTER — Ambulatory Visit (HOSPITAL_BASED_OUTPATIENT_CLINIC_OR_DEPARTMENT_OTHER): Payer: Medicare Other

## 2012-11-27 VITALS — BP 121/73 | HR 89 | Temp 98.5°F

## 2012-11-27 DIAGNOSIS — Z5112 Encounter for antineoplastic immunotherapy: Secondary | ICD-10-CM

## 2012-11-27 DIAGNOSIS — C9000 Multiple myeloma not having achieved remission: Secondary | ICD-10-CM

## 2012-11-27 MED ORDER — DEXAMETHASONE SODIUM PHOSPHATE 10 MG/ML IJ SOLN
10.0000 mg | Freq: Once | INTRAMUSCULAR | Status: AC
  Administered 2012-11-27: 10 mg via INTRAVENOUS

## 2012-11-27 MED ORDER — HEPARIN SOD (PORK) LOCK FLUSH 100 UNIT/ML IV SOLN
500.0000 [IU] | Freq: Once | INTRAVENOUS | Status: AC | PRN
  Administered 2012-11-27: 500 [IU]
  Filled 2012-11-27: qty 5

## 2012-11-27 MED ORDER — SODIUM CHLORIDE 0.9 % IV SOLN
Freq: Once | INTRAVENOUS | Status: AC
  Administered 2012-11-27: 08:00:00 via INTRAVENOUS

## 2012-11-27 MED ORDER — ONDANSETRON 8 MG/50ML IVPB (CHCC)
8.0000 mg | Freq: Once | INTRAVENOUS | Status: AC
  Administered 2012-11-27: 8 mg via INTRAVENOUS

## 2012-11-27 MED ORDER — SODIUM CHLORIDE 0.9 % IJ SOLN
10.0000 mL | INTRAMUSCULAR | Status: DC | PRN
  Administered 2012-11-27: 10 mL
  Filled 2012-11-27: qty 10

## 2012-11-27 MED ORDER — SODIUM CHLORIDE 0.9 % IV SOLN: Freq: Once | INTRAVENOUS | Status: DC

## 2012-11-27 MED ORDER — DEXTROSE 5 % IV SOLN
36.0000 mg/m2 | Freq: Once | INTRAVENOUS | Status: AC
  Administered 2012-11-27: 76 mg via INTRAVENOUS
  Filled 2012-11-27: qty 38

## 2012-11-27 NOTE — Patient Instructions (Addendum)
[facility] Discharge Instructions for Patients Receiving Chemotherapy  Today you received the following chemotherapy agents Kyprolis.  To help prevent nausea and vomiting after your treatment, we encourage you to take your nausea medication.   If you develop nausea and vomiting that is not controlled by your nausea medication, call the clinic.   BELOW ARE SYMPTOMS THAT SHOULD BE REPORTED IMMEDIATELY:  *FEVER GREATER THAN 100.5 F  *CHILLS WITH OR WITHOUT FEVER  NAUSEA AND VOMITING THAT IS NOT CONTROLLED WITH YOUR NAUSEA MEDICATION  *UNUSUAL SHORTNESS OF BREATH  *UNUSUAL BRUISING OR BLEEDING  TENDERNESS IN MOUTH AND THROAT WITH OR WITHOUT PRESENCE OF ULCERS  *URINARY PROBLEMS  *BOWEL PROBLEMS  UNUSUAL RASH Items with * indicate a potential emergency and should be followed up as soon as possible.  Feel free to call the clinic you have any questions or concerns. The clinic phone number is [phone].    

## 2012-12-04 ENCOUNTER — Ambulatory Visit (HOSPITAL_BASED_OUTPATIENT_CLINIC_OR_DEPARTMENT_OTHER): Payer: Medicare Other

## 2012-12-04 ENCOUNTER — Other Ambulatory Visit: Payer: Medicare Other | Admitting: Lab

## 2012-12-04 VITALS — BP 117/76 | HR 72 | Temp 97.1°F

## 2012-12-04 DIAGNOSIS — C9000 Multiple myeloma not having achieved remission: Secondary | ICD-10-CM

## 2012-12-04 DIAGNOSIS — Z5112 Encounter for antineoplastic immunotherapy: Secondary | ICD-10-CM

## 2012-12-04 DIAGNOSIS — Z5111 Encounter for antineoplastic chemotherapy: Secondary | ICD-10-CM

## 2012-12-04 LAB — CBC WITH DIFFERENTIAL/PLATELET
Basophils Absolute: 0 10*3/uL (ref 0.0–0.1)
HCT: 34.3 % — ABNORMAL LOW (ref 38.4–49.9)
HGB: 11.3 g/dL — ABNORMAL LOW (ref 13.0–17.1)
MONO#: 0.9 10*3/uL (ref 0.1–0.9)
NEUT#: 6 10*3/uL (ref 1.5–6.5)
NEUT%: 77.1 % — ABNORMAL HIGH (ref 39.0–75.0)
WBC: 7.8 10*3/uL (ref 4.0–10.3)
lymph#: 0.8 10*3/uL — ABNORMAL LOW (ref 0.9–3.3)

## 2012-12-04 LAB — COMPREHENSIVE METABOLIC PANEL (CC13)
AST: 29 U/L (ref 5–34)
Alkaline Phosphatase: 65 U/L (ref 40–150)
BUN: 16.8 mg/dL (ref 7.0–26.0)
Calcium: 9.1 mg/dL (ref 8.4–10.4)
Chloride: 106 mEq/L (ref 98–107)
Creatinine: 1 mg/dL (ref 0.7–1.3)
Glucose: 119 mg/dl — ABNORMAL HIGH (ref 70–99)

## 2012-12-04 MED ORDER — HEPARIN SOD (PORK) LOCK FLUSH 100 UNIT/ML IV SOLN
500.0000 [IU] | Freq: Once | INTRAVENOUS | Status: AC | PRN
  Administered 2012-12-04: 500 [IU]
  Filled 2012-12-04: qty 5

## 2012-12-04 MED ORDER — DEXAMETHASONE SODIUM PHOSPHATE 10 MG/ML IJ SOLN
10.0000 mg | Freq: Once | INTRAMUSCULAR | Status: AC
  Administered 2012-12-04: 10 mg via INTRAVENOUS

## 2012-12-04 MED ORDER — DEXTROSE 5 % IV SOLN
36.0000 mg/m2 | Freq: Once | INTRAVENOUS | Status: AC
  Administered 2012-12-04: 76 mg via INTRAVENOUS
  Filled 2012-12-04: qty 38

## 2012-12-04 MED ORDER — SODIUM CHLORIDE 0.9 % IJ SOLN
10.0000 mL | INTRAMUSCULAR | Status: DC | PRN
  Administered 2012-12-04: 10 mL
  Filled 2012-12-04: qty 10

## 2012-12-04 MED ORDER — SODIUM CHLORIDE 0.9 % IV SOLN
Freq: Once | INTRAVENOUS | Status: AC
  Administered 2012-12-04: 09:00:00 via INTRAVENOUS

## 2012-12-04 MED ORDER — SODIUM CHLORIDE 0.9 % IV SOLN
300.0000 mg/m2 | Freq: Once | INTRAVENOUS | Status: AC
  Administered 2012-12-04: 640 mg via INTRAVENOUS
  Filled 2012-12-04: qty 32

## 2012-12-04 MED ORDER — ONDANSETRON 8 MG/50ML IVPB (CHCC)
8.0000 mg | Freq: Once | INTRAVENOUS | Status: AC
  Administered 2012-12-04: 8 mg via INTRAVENOUS

## 2012-12-04 NOTE — Patient Instructions (Addendum)
[facility] Discharge Instructions for Patients Receiving Chemotherapy  Today you received the following chemotherapy agents cytoxan, kyprolis. To help prevent nausea and vomiting after your treatment, we encourage you to take your nausea medication compazine. Begin taking it today and take it as often as prescribed.   If you develop nausea and vomiting that is not controlled by your nausea medication, call the clinic. If it is after clinic hours your family physician or the after hours number for the clinic or go to the Emergency Department.   BELOW ARE SYMPTOMS THAT SHOULD BE REPORTED IMMEDIATELY:  *FEVER GREATER THAN 100.5 F  *CHILLS WITH OR WITHOUT FEVER  NAUSEA AND VOMITING THAT IS NOT CONTROLLED WITH YOUR NAUSEA MEDICATION  *UNUSUAL SHORTNESS OF BREATH  *UNUSUAL BRUISING OR BLEEDING  TENDERNESS IN MOUTH AND THROAT WITH OR WITHOUT PRESENCE OF ULCERS  *URINARY PROBLEMS  *BOWEL PROBLEMS  UNUSUAL RASH Items with * indicate a potential emergency and should be followed up as soon as possible. . Feel free to call the clinic you have any questions or concerns. The clinic phone number is [phone].   I have been informed and understand all the instructions given to me. I know to contact the clinic, my physician, or go to the Emergency Department if any problems should occur. I do not have any questions at this time, but understand that I may call the clinic during office hours   should I have any questions or need assistance in obtaining follow up care.    __________________________________________  _____________  __________ Signature of Patient or Authorized Representative            Date                   Time    __________________________________________ Nurse's Signature

## 2012-12-05 ENCOUNTER — Ambulatory Visit (HOSPITAL_BASED_OUTPATIENT_CLINIC_OR_DEPARTMENT_OTHER): Payer: Medicare Other

## 2012-12-05 VITALS — BP 133/77 | HR 75 | Temp 97.0°F | Resp 20

## 2012-12-05 DIAGNOSIS — Z5112 Encounter for antineoplastic immunotherapy: Secondary | ICD-10-CM

## 2012-12-05 DIAGNOSIS — C9000 Multiple myeloma not having achieved remission: Secondary | ICD-10-CM

## 2012-12-05 LAB — IGG, IGA, IGM
IgA: 26 mg/dL — ABNORMAL LOW (ref 68–379)
IgG (Immunoglobin G), Serum: 1810 mg/dL — ABNORMAL HIGH (ref 650–1600)

## 2012-12-05 LAB — KAPPA/LAMBDA LIGHT CHAINS
Kappa:Lambda Ratio: 0.16 — ABNORMAL LOW (ref 0.26–1.65)
Lambda Free Lght Chn: 7.47 mg/dL — ABNORMAL HIGH (ref 0.57–2.63)

## 2012-12-05 MED ORDER — HEPARIN SOD (PORK) LOCK FLUSH 100 UNIT/ML IV SOLN
500.0000 [IU] | Freq: Once | INTRAVENOUS | Status: AC | PRN
  Administered 2012-12-05: 500 [IU]
  Filled 2012-12-05: qty 5

## 2012-12-05 MED ORDER — DEXTROSE 5 % IV SOLN
36.0000 mg/m2 | Freq: Once | INTRAVENOUS | Status: AC
  Administered 2012-12-05: 76 mg via INTRAVENOUS
  Filled 2012-12-05: qty 38

## 2012-12-05 MED ORDER — SODIUM CHLORIDE 0.9 % IJ SOLN
10.0000 mL | INTRAMUSCULAR | Status: DC | PRN
  Administered 2012-12-05: 10 mL
  Filled 2012-12-05: qty 10

## 2012-12-05 MED ORDER — ONDANSETRON 8 MG/50ML IVPB (CHCC)
8.0000 mg | Freq: Once | INTRAVENOUS | Status: AC
  Administered 2012-12-05: 8 mg via INTRAVENOUS

## 2012-12-05 MED ORDER — SODIUM CHLORIDE 0.9 % IV SOLN
Freq: Once | INTRAVENOUS | Status: AC
  Administered 2012-12-05: 08:00:00 via INTRAVENOUS

## 2012-12-05 MED ORDER — DEXAMETHASONE SODIUM PHOSPHATE 10 MG/ML IJ SOLN
10.0000 mg | Freq: Once | INTRAMUSCULAR | Status: AC
  Administered 2012-12-05: 10 mg via INTRAVENOUS

## 2012-12-05 NOTE — Patient Instructions (Signed)
[facility] Discharge Instructions for Patients Receiving Chemotherapy  Today you received the following chemotherapy agents :  Kyprolis.  To help prevent nausea and vomiting after your treatment, we encourage you to take your nausea medication as instructed by your physician.    If you develop nausea and vomiting that is not controlled by your nausea medication, call the clinic. If it is after clinic hours your family physician or the after hours number for the clinic or go to the Emergency Department.   BELOW ARE SYMPTOMS THAT SHOULD BE REPORTED IMMEDIATELY:  *FEVER GREATER THAN 100.5 F  *CHILLS WITH OR WITHOUT FEVER  NAUSEA AND VOMITING THAT IS NOT CONTROLLED WITH YOUR NAUSEA MEDICATION  *UNUSUAL SHORTNESS OF BREATH  *UNUSUAL BRUISING OR BLEEDING  TENDERNESS IN MOUTH AND THROAT WITH OR WITHOUT PRESENCE OF ULCERS  *URINARY PROBLEMS  *BOWEL PROBLEMS  UNUSUAL RASH Items with * indicate a potential emergency and should be followed up as soon as possible.  One of the nurses will contact you 24 hours after your treatment. Please let the nurse know about any problems that you may have experienced. Feel free to call the clinic you have any questions or concerns. The clinic phone number is [phone].   I have been informed and understand all the instructions given to me. I know to contact the clinic, my physician, or go to the Emergency Department if any problems should occur. I do not have any questions at this time, but understand that I may call the clinic during office hours   should I have any questions or need assistance in obtaining follow up care.    __________________________________________  _____________  __________ Signature of Patient or Authorized Representative            Date                   Time    __________________________________________ Nurse's Signature    

## 2012-12-11 ENCOUNTER — Other Ambulatory Visit: Payer: Self-pay | Admitting: Dermatology

## 2012-12-11 ENCOUNTER — Ambulatory Visit: Payer: Medicare Other

## 2012-12-11 ENCOUNTER — Other Ambulatory Visit: Payer: Medicare Other | Admitting: Lab

## 2012-12-12 ENCOUNTER — Ambulatory Visit: Payer: Medicare Other

## 2012-12-13 ENCOUNTER — Ambulatory Visit (INDEPENDENT_AMBULATORY_CARE_PROVIDER_SITE_OTHER): Payer: Medicare Other | Admitting: Family Medicine

## 2012-12-13 ENCOUNTER — Encounter: Payer: Self-pay | Admitting: Family Medicine

## 2012-12-13 VITALS — BP 112/76 | HR 73 | Temp 97.4°F | Ht 69.0 in | Wt 199.0 lb

## 2012-12-13 DIAGNOSIS — J209 Acute bronchitis, unspecified: Secondary | ICD-10-CM

## 2012-12-13 MED ORDER — LEVOFLOXACIN 500 MG PO TABS: 500.0000 mg | ORAL_TABLET | Freq: Every day | ORAL | Status: DC

## 2012-12-13 NOTE — Progress Notes (Signed)
Chief Complaint  Patient presents with  . Cough    X 5 DAYS PRODUCTIVE W/COLOR  PT IS ON CHEMO FROM [facility]   . Headache  . Fatigue   Started 5 days ago with ears being plugged, cough, and headache at both temples.  Now breathing is raspy, cough is getting worse.  Cough is productive of yellow mucus.  Nose is runny, clear. Started with fever to 101, hasn't seen fevers that high since then, but now having chills.  Feels similar to when he had walking pneumonia in the past.  Using benadryl, OTC antihistamine (generic claritin). Using cough drops, and an OTC cough syrup for night-time cough (using at night, using lozenges during the day).  Protein numbers are falling.  Has appt with stem cell transplant doctor in [facility] tomorrow. Multiple Myeloma seems to be responding to current chemo treatment.  Past Medical History  Diagnosis Date  . GERD (gastroesophageal reflux disease)   . Adenomatous polyp of colon 1996  . Arthritis     Osteoarthritis right knee, spine, wrist  . Benign prostatic hypertrophy     (Dr. Vernie Ammons)  . History of chronic prostatitis   . History of melanoma     Back (Dr. Karlyn Agee); early melanoma R arm 03/2011  . Diverticulosis   . Arthritis of hand, right     Right thumb (Dr. Teressa Senter)  . Degenerative disc disease   . Hemorrhoids   . Erosive esophagitis   . Back ache     r/o Multiple Myeloma  . Multiple myeloma 2013   Past Surgical History  Procedure Laterality Date  . Total knee replaced  2000    Left knee  . Appendectomy    . Colonoscopy  2009  . Esophagogastroduodenoscopy  2009    mild inflammation at esophagogastric junction  . Excision of melanoma      back ( x3)  . Great toe surgery      bilateral  . Inguinal hernia repair      left, x 2  . Total knee arthroplasty  06/08/2011    Procedure: TOTAL KNEE ARTHROPLASTY;  Surgeon: Loreta Ave, MD;  Location: [facility] OR;  Service: Orthopedics;  Laterality: Right;  osteonics    History   Social History  . Marital Status: Married    Spouse Name: N/A    Number of Children: 2  . Years of Education: N/A   Occupational History  . mortgage banking retired    Social History Main Topics  . Smoking status: Former Smoker -- 3.00 packs/day for 10 years    Types: Cigarettes    Quit date: 07/11/1976  . Smokeless tobacco: Never Used     Comment: 3 PPD x 6 years  . Alcohol Use: 1.5 oz/week    3 drink(s) per week     Comment: 2 glasses of wine daily  . Drug Use: No  . Sexually Active: Yes -- Male partner(s)   Other Topics Concern  . Not on file   Social History Narrative  . No narrative on file   Current Outpatient Prescriptions on File Prior to Visit  Medication Sig Dispense Refill  . acyclovir (ZOVIRAX) 400 MG tablet Take 1 tablet (400 mg total) by mouth 2 (two) times daily.  60 tablet  5  . ascorbic acid (VITAMIN C) 500 MG tablet Take 500 mg by mouth daily.      Marland Kitchen aspirin EC 81 MG tablet Take 81 mg by mouth every other day.      Marland Kitchen  B Complex-C (B-COMPLEX WITH VITAMIN C) tablet Take 1 tablet by mouth daily.      . CELEBREX 200 MG capsule Take 200 mg by mouth daily as needed for pain.       . cholecalciferol (VITAMIN D) 1000 UNITS tablet Take 1,000 Units by mouth daily.      Marland Kitchen dexamethasone (DECADRON) 4 MG tablet Take 10 tablets (40 mg total) by mouth once a week. Takes on Monday am.  120 tablet  0  . fish oil-omega-3 fatty acids 1000 MG capsule Take 1 g by mouth daily.       Marland Kitchen lidocaine-prilocaine (EMLA) cream Apply topically as needed. Apply to the port 1 hour before chemo  30 g  0  . Multiple Vitamin (MULTIVITAMIN) capsule Take 1 capsule by mouth daily.      . pantoprazole (PROTONIX) 40 MG tablet Take 1 tablet (40 mg total) by mouth 2 (two) times daily.  60 tablet  0  . Probiotic Product (ALIGN) 4 MG CAPS Take 1 capsule by mouth daily.      . prochlorperazine (COMPAZINE) 10 MG tablet Take 1 tablet (10 mg total) by mouth every 6 (six) hours as needed.  Nausea  30 tablet  1  . temazepam (RESTORIL) 30 MG capsule Take 1 capsule (30 mg total) by mouth at bedtime as needed for sleep.  30 capsule  0  . zolendronic acid (ZOMETA) 4 MG/5ML injection Inject 4 mg into the vein every 28 (twenty-eight) days.      Marland Kitchen HYDROcodone-acetaminophen (NORCO) 7.5-325 MG per tablet Take 1 tablet by mouth every 6 (six) hours as needed for pain.        Current Facility-Administered Medications on File Prior to Visit  Medication Dose Route Frequency Provider Last Rate Last Dose  . ondansetron (ZOFRAN) tablet 8 mg  8 mg Oral Once Si Gaul, MD       Allergies  Allergen Reactions  . Penicillins     REACTION: rash   ROS:  Denies nausea, vomiting, diarrhea, skin rash, joint pains, chest pain, bleeding, bruising or other concerns except as per HPI.  PHYSICAL EXAM: BP 112/76  Pulse 73  Temp(Src) 97.4 F (36.3 C) (Oral)  Ht 5\' 9"  (1.753 m)  Wt 199 lb (90.266 kg)  BMI 29.37 kg/m2  SpO2 96% Well developed male in no distress. Periods of dry sounding cough.  Speaking easily in full sentences HEENT:  PERRL, EOMI, conjunctiva clear. TM's and EAC's normal.  Nasal mucosa normal.   Sinuses: nontender over frontal and maxillary.  Mild tenderness over mastoid bones bilaterally.  OP clear Neck: no lymphadenopathy Heart: regular rate and rhythm without murmur Lungs: ronchi bilaterally, R base more than L.  Some clearing after coughing up small amount of yellow phelgm, but still coarse breath sounds.  No rales or wheezes noted Skin: no rash   ASSESSMENT/PLAN: Acute bronchitis - cannot rule out early pneumonia - Plan: levofloxacin (LEVAQUIN) 500 MG tablet   Bronchitis vs early pneumonia.  Treat with antibiotics given his immunocompromised state.  Discussed supportive measures.  Needs recheck next week.  Has appts scheduled with oncologist--if 100% better and lung exam normal per other docs, no need to f/u here.

## 2012-12-13 NOTE — Patient Instructions (Signed)
Take antibiotics daily. You may use mucinex to help loosen the phlegm You may use Delsym syrup as needed as a cough suppressant (vs getting DM version of mucinex).--dextromethorphan is the active ingredient as cough suppressant, and this is likely already in your syrup you have at home--don't use together.  Return if having nausea/vomiting, unable to keep down the antibiotics, persistent/worsening fevers, worsening cough, shortness of breath or other symptoms. Otherwise f/u 1 week for recheck.  If you are seeing other doctors, and feeling completely better, no need to follow up here.  Let your cancer doctors know that you are on antibiotics for infection, as this may affect your scheduling of chemo.

## 2012-12-17 ENCOUNTER — Ambulatory Visit (HOSPITAL_BASED_OUTPATIENT_CLINIC_OR_DEPARTMENT_OTHER): Payer: Medicare Other | Admitting: Internal Medicine

## 2012-12-17 ENCOUNTER — Telehealth: Payer: Self-pay | Admitting: *Deleted

## 2012-12-17 ENCOUNTER — Encounter: Payer: Self-pay | Admitting: Internal Medicine

## 2012-12-17 ENCOUNTER — Telehealth: Payer: Self-pay | Admitting: Medical Oncology

## 2012-12-17 ENCOUNTER — Ambulatory Visit (HOSPITAL_BASED_OUTPATIENT_CLINIC_OR_DEPARTMENT_OTHER): Payer: Medicare Other

## 2012-12-17 ENCOUNTER — Other Ambulatory Visit (HOSPITAL_BASED_OUTPATIENT_CLINIC_OR_DEPARTMENT_OTHER): Payer: Medicare Other

## 2012-12-17 VITALS — BP 118/71 | HR 69 | Temp 97.4°F | Resp 18 | Ht 69.0 in | Wt 203.7 lb

## 2012-12-17 DIAGNOSIS — R110 Nausea: Secondary | ICD-10-CM

## 2012-12-17 DIAGNOSIS — C9000 Multiple myeloma not having achieved remission: Secondary | ICD-10-CM

## 2012-12-17 DIAGNOSIS — Z5111 Encounter for antineoplastic chemotherapy: Secondary | ICD-10-CM

## 2012-12-17 DIAGNOSIS — Z5112 Encounter for antineoplastic immunotherapy: Secondary | ICD-10-CM

## 2012-12-17 LAB — CBC WITH DIFFERENTIAL/PLATELET
BASO%: 0.3 % (ref 0.0–2.0)
Basophils Absolute: 0 10*3/uL (ref 0.0–0.1)
EOS%: 3 % (ref 0.0–7.0)
HGB: 12.3 g/dL — ABNORMAL LOW (ref 13.0–17.1)
MCH: 30.8 pg (ref 27.2–33.4)
MCHC: 34.5 g/dL (ref 32.0–36.0)
MCV: 89.3 fL (ref 79.3–98.0)
MONO%: 3.4 % (ref 0.0–14.0)
RBC: 3.99 10*6/uL — ABNORMAL LOW (ref 4.20–5.82)
RDW: 19.6 % — ABNORMAL HIGH (ref 11.0–14.6)
lymph#: 0.4 10*3/uL — ABNORMAL LOW (ref 0.9–3.3)

## 2012-12-17 LAB — COMPREHENSIVE METABOLIC PANEL (CC13)
ALT: 18 U/L (ref 0–55)
AST: 18 U/L (ref 5–34)
Albumin: 3.1 g/dL — ABNORMAL LOW (ref 3.5–5.0)
Alkaline Phosphatase: 61 U/L (ref 40–150)
Calcium: 9.1 mg/dL (ref 8.4–10.4)
Chloride: 108 mEq/L — ABNORMAL HIGH (ref 98–107)
Potassium: 4.1 mEq/L (ref 3.5–5.1)
Sodium: 138 mEq/L (ref 136–145)
Total Protein: 7.4 g/dL (ref 6.4–8.3)

## 2012-12-17 MED ORDER — HEPARIN SOD (PORK) LOCK FLUSH 100 UNIT/ML IV SOLN
500.0000 [IU] | Freq: Once | INTRAVENOUS | Status: AC | PRN
  Administered 2012-12-17: 500 [IU]
  Filled 2012-12-17: qty 5

## 2012-12-17 MED ORDER — SODIUM CHLORIDE 0.9 % IJ SOLN
10.0000 mL | INTRAMUSCULAR | Status: DC | PRN
  Administered 2012-12-17: 10 mL
  Filled 2012-12-17: qty 10

## 2012-12-17 MED ORDER — SODIUM CHLORIDE 0.9 % IV SOLN
Freq: Once | INTRAVENOUS | Status: AC
  Administered 2012-12-17: 10:00:00 via INTRAVENOUS

## 2012-12-17 MED ORDER — DEXTROSE 5 % IV SOLN
36.0000 mg/m2 | Freq: Once | INTRAVENOUS | Status: AC
  Administered 2012-12-17: 76 mg via INTRAVENOUS
  Filled 2012-12-17: qty 38

## 2012-12-17 MED ORDER — DEXAMETHASONE SODIUM PHOSPHATE 10 MG/ML IJ SOLN
10.0000 mg | Freq: Once | INTRAMUSCULAR | Status: AC
  Administered 2012-12-17: 10 mg via INTRAVENOUS

## 2012-12-17 MED ORDER — TEMAZEPAM 30 MG PO CAPS: 30.0000 mg | ORAL_CAPSULE | Freq: Every evening | ORAL | Status: DC | PRN

## 2012-12-17 MED ORDER — ONDANSETRON 8 MG/50ML IVPB (CHCC)
8.0000 mg | Freq: Once | INTRAVENOUS | Status: AC
  Administered 2012-12-17: 8 mg via INTRAVENOUS

## 2012-12-17 MED ORDER — PROCHLORPERAZINE MALEATE 10 MG PO TABS: 10.0000 mg | ORAL_TABLET | Freq: Four times a day (QID) | ORAL | Status: DC | PRN

## 2012-12-17 MED ORDER — SODIUM CHLORIDE 0.9 % IV SOLN
300.0000 mg/m2 | Freq: Once | INTRAVENOUS | Status: AC
  Administered 2012-12-17: 640 mg via INTRAVENOUS
  Filled 2012-12-17: qty 32

## 2012-12-17 MED ORDER — ZOLEDRONIC ACID 4 MG/100ML IV SOLN
4.0000 mg | Freq: Once | INTRAVENOUS | Status: AC
  Administered 2012-12-17: 4 mg via INTRAVENOUS
  Filled 2012-12-17: qty 100

## 2012-12-17 NOTE — Telephone Encounter (Signed)
Per staff message and POF I have adjusted appt for 6/23. JMW

## 2012-12-17 NOTE — Patient Instructions (Signed)
[facility] Discharge Instructions for Patients Receiving Chemotherapy  Today you received the following chemotherapy agents Kyprolis/Cytoxan/Zometa To help prevent nausea and vomiting after your treatment, we encourage you to take your nausea medication as prescribed.  If you develop nausea and vomiting that is not controlled by your nausea medication, call the clinic.   BELOW ARE SYMPTOMS THAT SHOULD BE REPORTED IMMEDIATELY:  *FEVER GREATER THAN 100.5 F  *CHILLS WITH OR WITHOUT FEVER  NAUSEA AND VOMITING THAT IS NOT CONTROLLED WITH YOUR NAUSEA MEDICATION  *UNUSUAL SHORTNESS OF BREATH  *UNUSUAL BRUISING OR BLEEDING  TENDERNESS IN MOUTH AND THROAT WITH OR WITHOUT PRESENCE OF ULCERS  *URINARY PROBLEMS  *BOWEL PROBLEMS  UNUSUAL RASH Items with * indicate a potential emergency and should be followed up as soon as possible.  Feel free to call the clinic you have any questions or concerns. The clinic phone number is [phone].

## 2012-12-17 NOTE — Patient Instructions (Signed)
You have significant improvement in the myeloma panel. Continue chemotherapy with the current regimen. Followup visit in 2 weeks

## 2012-12-17 NOTE — Progress Notes (Signed)
[facility] [phone]   [phone]  OFFICE PROGRESS NOTE  [name],[name], MD [address] [place] [address]  DIAGNOSIS: Stage IIA multiple myeloma diagnosed in July of 2013   PRIOR THERAPY:  1) Systemic chemotherapy with subcutaneous Velcade 1.3 mg/M2 on days 1, 4, 8 and 11 every 3 weeks in addition to Revlimid 25 mg by mouth daily for 14 days every 3 weeks and dexamethasone 40 mg on a weekly basis. Status post 4 planned cycles.  2) Systemic chemotherapy with subcutaneous Velcade 1.3 mg/M2 on days 1, 4, 8 and 11 every 3 weeks in addition to Revlimid 25 mg by mouth daily for 14 days every 3 weeks and dexamethasone 40 mg on a weekly basis. 4 more cycles are planned and he is status post 4 cycles of the second set of 4 cycles, now status post a total of 10 cycles   CURRENT THERAPY:  1. Systemic chemotherapy with Carfilzomib 36 mg/M2 on days 1, 2, 8, 9, 15 and 16 in addition to cyclophosphamide 300 mg/M2 IV on days 1, 8 and 15 as well as dexamethasone on days 1, 8, 15 and 22 every 4 weeks. Status post 2 cycles. 2. Zometa 4 mg IV given a monthly basis for bone disease   INTERVAL HISTORY: [name] 72 y.o. male returns to the clinic today for followup visit. The patient is feeling fine today with no specific complaints he is tolerating his systemic chemotherapy with Carfilzomib, cyclophosphamide and dexamethasone fairly well. He was seen recently at [facility] by Dr. Marissa Calamity as well as Dr. Lance Bosch and he would be considered for autologous peripheral blood stem cell transplant in July of 2014. He was advised to continue his current systemic chemotherapy. The patient had repeat myeloma panel performed recently and he is here for evaluation and discussion of his lab results.  MEDICAL HISTORY: Past Medical History  Diagnosis Date  . GERD (gastroesophageal reflux disease)   . Adenomatous polyp of colon 1996  . Arthritis    Osteoarthritis right knee, spine, wrist  . Benign prostatic hypertrophy     (Dr. Vernie Ammons)  . History of chronic prostatitis   . History of melanoma     Back (Dr. Karlyn Agee); early melanoma R arm 03/2011  . Diverticulosis   . Arthritis of hand, right     Right thumb (Dr. Teressa Senter)  . Degenerative disc disease   . Hemorrhoids   . Erosive esophagitis   . Back ache     r/o Multiple Myeloma  . Multiple myeloma 2013    ALLERGIES:  is allergic to penicillins.  MEDICATIONS:  Current Outpatient Prescriptions  Medication Sig Dispense Refill  . acyclovir (ZOVIRAX) 400 MG tablet Take 1 tablet (400 mg total) by mouth 2 (two) times daily.  60 tablet  5  . ascorbic acid (VITAMIN C) 500 MG tablet Take 500 mg by mouth daily.      Marland Kitchen aspirin EC 81 MG tablet Take 81 mg by mouth every other day.      . B Complex-C (B-COMPLEX WITH VITAMIN C) tablet Take 1 tablet by mouth daily.      . carfilzomib (KYPROLIS) 60 MG SOLR Inject into the vein.      Marland Kitchen CELEBREX 200 MG capsule Take 200 mg by mouth daily as needed for pain.       . cholecalciferol (VITAMIN D) 1000 UNITS tablet Take 1,000 Units by mouth daily.      Marland Kitchen  Cyclophosphamide (CYTOXAN LYOPHILIZED IV) Inject into the vein.      Marland Kitchen dexamethasone (DECADRON) 4 MG tablet Take 10 tablets (40 mg total) by mouth once a week. Takes on Monday am.  120 tablet  0  . fish oil-omega-3 fatty acids 1000 MG capsule Take 1 g by mouth daily.       Marland Kitchen HYDROcodone-acetaminophen (NORCO) 7.5-325 MG per tablet Take 1 tablet by mouth every 6 (six) hours as needed for pain.       Marland Kitchen levofloxacin (LEVAQUIN) 500 MG tablet Take 1 tablet (500 mg total) by mouth daily.  10 tablet  0  . lidocaine-prilocaine (EMLA) cream Apply topically as needed. Apply to the port 1 hour before chemo  30 g  0  . Multiple Vitamin (MULTIVITAMIN) capsule Take 1 capsule by mouth daily.      . pantoprazole (PROTONIX) 40 MG tablet Take 1 tablet (40 mg total) by mouth 2 (two) times daily.  60 tablet  0  .  Probiotic Product (ALIGN) 4 MG CAPS Take 1 capsule by mouth daily.      . prochlorperazine (COMPAZINE) 10 MG tablet Take 1 tablet (10 mg total) by mouth every 6 (six) hours as needed. Nausea  30 tablet  1  . temazepam (RESTORIL) 30 MG capsule Take 1 capsule (30 mg total) by mouth at bedtime as needed for sleep.  30 capsule  0  . zolendronic acid (ZOMETA) 4 MG/5ML injection Inject 4 mg into the vein every 28 (twenty-eight) days.       No current facility-administered medications for this visit.   Facility-Administered Medications Ordered in Other Visits  Medication Dose Route Frequency Provider Last Rate Last Dose  . ondansetron (ZOFRAN) tablet 8 mg  8 mg Oral Once Si Gaul, MD        SURGICAL HISTORY:  Past Surgical History  Procedure Laterality Date  . Total knee replaced  2000    Left knee  . Appendectomy    . Colonoscopy  2009  . Esophagogastroduodenoscopy  2009    mild inflammation at esophagogastric junction  . Excision of melanoma      back ( x3)  . Great toe surgery      bilateral  . Inguinal hernia repair      left, x 2  . Total knee arthroplasty  06/08/2011    Procedure: TOTAL KNEE ARTHROPLASTY;  Surgeon: Loreta Ave, MD;  [facility];  Service: Orthopedics;  Laterality: Right;  osteonics    REVIEW OF SYSTEMS:  A comprehensive review of systems was negative.   PHYSICAL EXAMINATION: General appearance: alert, cooperative and no distress Head: Normocephalic, without obvious abnormality, atraumatic Neck: no adenopathy Lymph nodes: Cervical, supraclavicular, and axillary nodes normal. Resp: clear to auscultation bilaterally Cardio: regular rate and rhythm, S1, S2 normal, no murmur, click, rub or gallop GI: soft, non-tender; bowel sounds normal; no masses,  no organomegaly Extremities: extremities normal, atraumatic, no cyanosis or edema Neurologic: Alert and oriented X 3, normal strength and tone. Normal symmetric reflexes. Normal coordination and  gait  ECOG PERFORMANCE STATUS: 0 - Asymptomatic  Blood pressure 118/71, pulse 69, temperature 97.4 F (36.3 C), temperature source Oral, resp. rate 18, height 5\' 9"  (1.753 m), weight 203 lb 11.2 oz (92.398 kg).  LABORATORY DATA: Lab Results  Component Value Date   WBC 4.6 12/17/2012   HGB 12.3* 12/17/2012   HCT 35.7* 12/17/2012   MCV 89.3 12/17/2012   PLT 285 12/17/2012      Chemistry  Component Value Date/Time   NA 138 12/17/2012 0812   NA 136 10/30/2012 0650   K 4.1 12/17/2012 0812   K 3.7 10/30/2012 0650   CL 108* 12/17/2012 0812   CL 107 10/30/2012 0650   CO2 23 12/17/2012 0812   CO2 25 10/30/2012 0650   BUN 12.2 12/17/2012 0812   BUN 15 10/30/2012 0650   CREATININE 1.0 12/17/2012 0812   CREATININE 0.92 10/30/2012 0650   CREATININE 0.98 01/18/2012 0934      Component Value Date/Time   CALCIUM 9.1 12/17/2012 0812   CALCIUM 8.5 10/30/2012 0650   ALKPHOS 61 12/17/2012 0812   ALKPHOS 48 02/20/2012 1317   AST 18 12/17/2012 0812   AST 38* 02/20/2012 1317   ALT 18 12/17/2012 0812   ALT 33 02/20/2012 1317   BILITOT 0.46 12/17/2012 0812   BILITOT 0.2* 02/20/2012 1317     Other lab results: Free kappa light chain 1.18, free lambda light chain 7.47, kappa/lambda ratio 0.16, beta-2 microglobulin 2.25, IgG 1810, IgA 26 and IgM 21.  RADIOGRAPHIC STUDIES: No results found.  ASSESSMENT: This is a very pleasant 72 years old white male with multiple myeloma currently on treatment with Carfilzomib, cyclophosphamide and dexamethasone with significant improvement in his disease.   PLAN: I discussed the lab result with the patient today. I recommended for him to continue with 2 more cycles of systemic chemotherapy. I would consider the patient for repeat bone marrow biopsy and aspirate on 01/18/2013. After his bone marrow biopsy and aspirate, the patient will be referred back to Dr. Lance Bosch for consideration of the peripheral blood autologous stem cell transplant. He would come back for followup visit in 2 weeks for  evaluation and management any adverse effect of his chemotherapy  All questions were answered. The patient knows to call the clinic with any problems, questions or concerns. We can certainly see the patient much sooner if necessary.  I spent 15 minutes counseling the patient face to face. The total time spent in the appointment was 25 minutes.

## 2012-12-17 NOTE — Telephone Encounter (Signed)
restoril rx given to pt.

## 2012-12-18 ENCOUNTER — Ambulatory Visit (HOSPITAL_BASED_OUTPATIENT_CLINIC_OR_DEPARTMENT_OTHER): Payer: Medicare Other

## 2012-12-18 VITALS — BP 133/67 | Temp 98.2°F

## 2012-12-18 DIAGNOSIS — C9000 Multiple myeloma not having achieved remission: Secondary | ICD-10-CM

## 2012-12-18 DIAGNOSIS — Z5112 Encounter for antineoplastic immunotherapy: Secondary | ICD-10-CM

## 2012-12-18 MED ORDER — SODIUM CHLORIDE 0.9 % IV SOLN
Freq: Once | INTRAVENOUS | Status: AC
  Administered 2012-12-18: 09:00:00 via INTRAVENOUS

## 2012-12-18 MED ORDER — DEXAMETHASONE SODIUM PHOSPHATE 10 MG/ML IJ SOLN
10.0000 mg | Freq: Once | INTRAMUSCULAR | Status: AC
  Administered 2012-12-18: 10 mg via INTRAVENOUS

## 2012-12-18 MED ORDER — ONDANSETRON 8 MG/50ML IVPB (CHCC)
8.0000 mg | Freq: Once | INTRAVENOUS | Status: AC
  Administered 2012-12-18: 8 mg via INTRAVENOUS

## 2012-12-18 MED ORDER — HEPARIN SOD (PORK) LOCK FLUSH 100 UNIT/ML IV SOLN
500.0000 [IU] | Freq: Once | INTRAVENOUS | Status: AC | PRN
  Administered 2012-12-18: 500 [IU]
  Filled 2012-12-18: qty 5

## 2012-12-18 MED ORDER — SODIUM CHLORIDE 0.9 % IJ SOLN
10.0000 mL | INTRAMUSCULAR | Status: DC | PRN
  Administered 2012-12-18: 10 mL
  Filled 2012-12-18: qty 10

## 2012-12-18 MED ORDER — CARFILZOMIB CHEMO INJECTION 60 MG
36.0000 mg/m2 | Freq: Once | INTRAVENOUS | Status: AC
  Administered 2012-12-18: 76 mg via INTRAVENOUS
  Filled 2012-12-18: qty 38

## 2012-12-18 NOTE — Patient Instructions (Signed)
[facility] Discharge Instructions for Patients Receiving Chemotherapy  Today you received the following chemotherapy agents kyprolis  To help prevent nausea and vomiting after your treatment, we encourage you to take your nausea medication as needed   If you develop nausea and vomiting that is not controlled by your nausea medication, call the clinic.   BELOW ARE SYMPTOMS THAT SHOULD BE REPORTED IMMEDIATELY:  *FEVER GREATER THAN 100.5 F  *CHILLS WITH OR WITHOUT FEVER  NAUSEA AND VOMITING THAT IS NOT CONTROLLED WITH YOUR NAUSEA MEDICATION  *UNUSUAL SHORTNESS OF BREATH  *UNUSUAL BRUISING OR BLEEDING  TENDERNESS IN MOUTH AND THROAT WITH OR WITHOUT PRESENCE OF ULCERS  *URINARY PROBLEMS  *BOWEL PROBLEMS  UNUSUAL RASH Items with * indicate a potential emergency and should be followed up as soon as possible.  Feel free to call the clinic you have any questions or concerns. The clinic phone number is [phone].    

## 2012-12-22 ENCOUNTER — Other Ambulatory Visit: Payer: Self-pay | Admitting: *Deleted

## 2012-12-24 ENCOUNTER — Telehealth: Payer: Self-pay | Admitting: Medical Oncology

## 2012-12-24 ENCOUNTER — Other Ambulatory Visit (HOSPITAL_BASED_OUTPATIENT_CLINIC_OR_DEPARTMENT_OTHER): Payer: Medicare Other | Admitting: Lab

## 2012-12-24 ENCOUNTER — Other Ambulatory Visit: Payer: Self-pay | Admitting: Medical Oncology

## 2012-12-24 ENCOUNTER — Ambulatory Visit (HOSPITAL_BASED_OUTPATIENT_CLINIC_OR_DEPARTMENT_OTHER): Payer: Medicare Other

## 2012-12-24 VITALS — BP 119/74 | HR 73 | Temp 98.0°F

## 2012-12-24 DIAGNOSIS — C9000 Multiple myeloma not having achieved remission: Secondary | ICD-10-CM

## 2012-12-24 DIAGNOSIS — Z5112 Encounter for antineoplastic immunotherapy: Secondary | ICD-10-CM

## 2012-12-24 DIAGNOSIS — Z5111 Encounter for antineoplastic chemotherapy: Secondary | ICD-10-CM

## 2012-12-24 LAB — CBC WITH DIFFERENTIAL/PLATELET
BASO%: 0.9 % (ref 0.0–2.0)
Basophils Absolute: 0 10*3/uL (ref 0.0–0.1)
EOS%: 2.4 % (ref 0.0–7.0)
HGB: 12.1 g/dL — ABNORMAL LOW (ref 13.0–17.1)
MCH: 30.7 pg (ref 27.2–33.4)
MCHC: 34 g/dL (ref 32.0–36.0)
MCV: 90.3 fL (ref 79.3–98.0)
MONO%: 5.3 % (ref 0.0–14.0)
NEUT%: 82.1 % — ABNORMAL HIGH (ref 39.0–75.0)
RDW: 19 % — ABNORMAL HIGH (ref 11.0–14.6)
lymph#: 0.4 10*3/uL — ABNORMAL LOW (ref 0.9–3.3)

## 2012-12-24 LAB — COMPREHENSIVE METABOLIC PANEL (CC13)
ALT: 15 U/L (ref 0–55)
AST: 16 U/L (ref 5–34)
Alkaline Phosphatase: 54 U/L (ref 40–150)
BUN: 17.2 mg/dL (ref 7.0–26.0)
Creatinine: 0.9 mg/dL (ref 0.7–1.3)

## 2012-12-24 MED ORDER — HEPARIN SOD (PORK) LOCK FLUSH 100 UNIT/ML IV SOLN
500.0000 [IU] | Freq: Once | INTRAVENOUS | Status: AC | PRN
  Administered 2012-12-24: 500 [IU]
  Filled 2012-12-24: qty 5

## 2012-12-24 MED ORDER — ONDANSETRON 8 MG/50ML IVPB (CHCC)
8.0000 mg | Freq: Once | INTRAVENOUS | Status: AC
  Administered 2012-12-24: 8 mg via INTRAVENOUS

## 2012-12-24 MED ORDER — SODIUM CHLORIDE 0.9 % IV SOLN
Freq: Once | INTRAVENOUS | Status: AC
  Administered 2012-12-24: 09:00:00 via INTRAVENOUS

## 2012-12-24 MED ORDER — DEXTROSE 5 % IV SOLN
36.0000 mg/m2 | Freq: Once | INTRAVENOUS | Status: AC
  Administered 2012-12-24: 76 mg via INTRAVENOUS
  Filled 2012-12-24: qty 38

## 2012-12-24 MED ORDER — SODIUM CHLORIDE 0.9 % IV SOLN
300.0000 mg/m2 | Freq: Once | INTRAVENOUS | Status: AC
  Administered 2012-12-24: 640 mg via INTRAVENOUS
  Filled 2012-12-24: qty 32

## 2012-12-24 MED ORDER — DEXAMETHASONE SODIUM PHOSPHATE 10 MG/ML IJ SOLN
10.0000 mg | Freq: Once | INTRAMUSCULAR | Status: AC
  Administered 2012-12-24: 10 mg via INTRAVENOUS

## 2012-12-24 MED ORDER — SODIUM CHLORIDE 0.9 % IJ SOLN
10.0000 mL | INTRAMUSCULAR | Status: DC | PRN
  Administered 2012-12-24: 10 mL
  Filled 2012-12-24: qty 10

## 2012-12-24 NOTE — Patient Instructions (Signed)
[facility] Discharge Instructions for Patients Receiving Chemotherapy  Today you received the following chemotherapy agents Kyprolis/Cytoxan.  To help prevent nausea and vomiting after your treatment, we encourage you to take your nausea medication as prescribed.   If you develop nausea and vomiting that is not controlled by your nausea medication, call the clinic.   BELOW ARE SYMPTOMS THAT SHOULD BE REPORTED IMMEDIATELY:  *FEVER GREATER THAN 100.5 F  *CHILLS WITH OR WITHOUT FEVER  NAUSEA AND VOMITING THAT IS NOT CONTROLLED WITH YOUR NAUSEA MEDICATION  *UNUSUAL SHORTNESS OF BREATH  *UNUSUAL BRUISING OR BLEEDING  TENDERNESS IN MOUTH AND THROAT WITH OR WITHOUT PRESENCE OF ULCERS  *URINARY PROBLEMS  *BOWEL PROBLEMS  UNUSUAL RASH Items with * indicate a potential emergency and should be followed up as soon as possible.  Feel free to call the clinic you have any questions or concerns. The clinic phone number is [phone].    

## 2012-12-24 NOTE — Telephone Encounter (Signed)
Mr. Garrette called to get his Bone morrow scheduled for July 11th. Note sent to Dr  Arbutus Ped

## 2012-12-25 ENCOUNTER — Ambulatory Visit (HOSPITAL_BASED_OUTPATIENT_CLINIC_OR_DEPARTMENT_OTHER): Payer: Medicare Other

## 2012-12-25 ENCOUNTER — Telehealth: Payer: Self-pay | Admitting: *Deleted

## 2012-12-25 ENCOUNTER — Other Ambulatory Visit: Payer: Self-pay | Admitting: *Deleted

## 2012-12-25 ENCOUNTER — Other Ambulatory Visit: Payer: Self-pay | Admitting: Medical Oncology

## 2012-12-25 VITALS — BP 125/66 | HR 96 | Temp 98.0°F

## 2012-12-25 DIAGNOSIS — C9000 Multiple myeloma not having achieved remission: Secondary | ICD-10-CM

## 2012-12-25 DIAGNOSIS — Z5112 Encounter for antineoplastic immunotherapy: Secondary | ICD-10-CM

## 2012-12-25 MED ORDER — DEXTROSE 5 % IV SOLN
36.0000 mg/m2 | Freq: Once | INTRAVENOUS | Status: AC
  Administered 2012-12-25: 76 mg via INTRAVENOUS
  Filled 2012-12-25: qty 38

## 2012-12-25 MED ORDER — SODIUM CHLORIDE 0.9 % IV SOLN: Freq: Once | INTRAVENOUS | Status: AC

## 2012-12-25 MED ORDER — ONDANSETRON 8 MG/50ML IVPB (CHCC)
8.0000 mg | Freq: Once | INTRAVENOUS | Status: AC
  Administered 2012-12-25: 8 mg via INTRAVENOUS

## 2012-12-25 MED ORDER — SODIUM CHLORIDE 0.9 % IV SOLN
Freq: Once | INTRAVENOUS | Status: AC
  Administered 2012-12-25: 08:00:00 via INTRAVENOUS

## 2012-12-25 MED ORDER — DEXAMETHASONE SODIUM PHOSPHATE 10 MG/ML IJ SOLN
10.0000 mg | Freq: Once | INTRAMUSCULAR | Status: AC
  Administered 2012-12-25: 10 mg via INTRAVENOUS

## 2012-12-25 MED ORDER — HEPARIN SOD (PORK) LOCK FLUSH 100 UNIT/ML IV SOLN
500.0000 [IU] | Freq: Once | INTRAVENOUS | Status: AC | PRN
  Administered 2012-12-25: 500 [IU]
  Filled 2012-12-25: qty 5

## 2012-12-25 MED ORDER — SODIUM CHLORIDE 0.9 % IJ SOLN
10.0000 mL | INTRAMUSCULAR | Status: DC | PRN
  Administered 2012-12-25: 10 mL
  Filled 2012-12-25: qty 10

## 2012-12-25 NOTE — Telephone Encounter (Signed)
BMBX scheduled at [facility] on 01/18/13.  Called [facility].  Spoke to pt, he is aware of date/ time/ when to arrive.  SLJ

## 2012-12-25 NOTE — Patient Instructions (Signed)
[facility] Discharge Instructions for Patients Receiving Chemotherapy  Today you received the following chemotherapy agents Kyprolis To help prevent nausea and vomiting after your treatment, we encourage you to take your nausea medication as prescribed.  If you develop nausea and vomiting that is not controlled by your nausea medication, call the clinic.   BELOW ARE SYMPTOMS THAT SHOULD BE REPORTED IMMEDIATELY:  *FEVER GREATER THAN 100.5 F  *CHILLS WITH OR WITHOUT FEVER  NAUSEA AND VOMITING THAT IS NOT CONTROLLED WITH YOUR NAUSEA MEDICATION  *UNUSUAL SHORTNESS OF BREATH  *UNUSUAL BRUISING OR BLEEDING  TENDERNESS IN MOUTH AND THROAT WITH OR WITHOUT PRESENCE OF ULCERS  *URINARY PROBLEMS  *BOWEL PROBLEMS  UNUSUAL RASH Items with * indicate a potential emergency and should be followed up as soon as possible.  Feel free to call the clinic you have any questions or concerns. The clinic phone number is [phone].    

## 2012-12-31 ENCOUNTER — Ambulatory Visit (HOSPITAL_BASED_OUTPATIENT_CLINIC_OR_DEPARTMENT_OTHER): Payer: Medicare Other | Admitting: Internal Medicine

## 2012-12-31 ENCOUNTER — Other Ambulatory Visit (HOSPITAL_BASED_OUTPATIENT_CLINIC_OR_DEPARTMENT_OTHER): Payer: Medicare Other | Admitting: Lab

## 2012-12-31 ENCOUNTER — Telehealth: Payer: Self-pay | Admitting: *Deleted

## 2012-12-31 ENCOUNTER — Ambulatory Visit (HOSPITAL_BASED_OUTPATIENT_CLINIC_OR_DEPARTMENT_OTHER): Payer: Medicare Other

## 2012-12-31 ENCOUNTER — Encounter: Payer: Self-pay | Admitting: Internal Medicine

## 2012-12-31 ENCOUNTER — Telehealth: Payer: Self-pay | Admitting: Internal Medicine

## 2012-12-31 ENCOUNTER — Other Ambulatory Visit: Payer: Medicare Other | Admitting: Lab

## 2012-12-31 VITALS — BP 123/71 | HR 67 | Temp 97.0°F | Resp 18 | Ht 69.0 in | Wt 210.7 lb

## 2012-12-31 DIAGNOSIS — C9000 Multiple myeloma not having achieved remission: Secondary | ICD-10-CM

## 2012-12-31 DIAGNOSIS — Z5112 Encounter for antineoplastic immunotherapy: Secondary | ICD-10-CM

## 2012-12-31 DIAGNOSIS — Z5111 Encounter for antineoplastic chemotherapy: Secondary | ICD-10-CM

## 2012-12-31 LAB — CBC WITH DIFFERENTIAL/PLATELET
Eosinophils Absolute: 0.2 10*3/uL (ref 0.0–0.5)
HCT: 33.2 % — ABNORMAL LOW (ref 38.4–49.9)
HGB: 11.2 g/dL — ABNORMAL LOW (ref 13.0–17.1)
LYMPH%: 12.8 % — ABNORMAL LOW (ref 14.0–49.0)
MONO#: 0.3 10*3/uL (ref 0.1–0.9)
NEUT#: 3 10*3/uL (ref 1.5–6.5)
NEUT%: 73.9 % (ref 39.0–75.0)
Platelets: 202 10*3/uL (ref 140–400)
WBC: 4 10*3/uL (ref 4.0–10.3)

## 2012-12-31 LAB — COMPREHENSIVE METABOLIC PANEL (CC13)
CO2: 26 mEq/L (ref 22–29)
Creatinine: 1 mg/dL (ref 0.7–1.3)
Glucose: 116 mg/dl — ABNORMAL HIGH (ref 70–99)
Sodium: 142 mEq/L (ref 136–145)
Total Bilirubin: 0.75 mg/dL (ref 0.20–1.20)
Total Protein: 6.6 g/dL (ref 6.4–8.3)

## 2012-12-31 MED ORDER — ONDANSETRON 8 MG/50ML IVPB (CHCC)
8.0000 mg | Freq: Once | INTRAVENOUS | Status: AC
  Administered 2012-12-31: 8 mg via INTRAVENOUS

## 2012-12-31 MED ORDER — SODIUM CHLORIDE 0.9 % IV SOLN
Freq: Once | INTRAVENOUS | Status: AC
Start: 2012-12-31 — End: 2012-12-31
  Administered 2012-12-31: 10:00:00 via INTRAVENOUS

## 2012-12-31 MED ORDER — DEXTROSE 5 % IV SOLN
36.0000 mg/m2 | Freq: Once | INTRAVENOUS | Status: AC
  Administered 2012-12-31: 76 mg via INTRAVENOUS
  Filled 2012-12-31: qty 38

## 2012-12-31 MED ORDER — HEPARIN SOD (PORK) LOCK FLUSH 100 UNIT/ML IV SOLN
500.0000 [IU] | Freq: Once | INTRAVENOUS | Status: AC | PRN
  Administered 2012-12-31: 500 [IU]
  Filled 2012-12-31: qty 5

## 2012-12-31 MED ORDER — DEXAMETHASONE SODIUM PHOSPHATE 10 MG/ML IJ SOLN
10.0000 mg | Freq: Once | INTRAMUSCULAR | Status: AC
  Administered 2012-12-31: 10 mg via INTRAVENOUS

## 2012-12-31 MED ORDER — SODIUM CHLORIDE 0.9 % IV SOLN
Freq: Once | INTRAVENOUS | Status: AC
  Administered 2012-12-31: 10:00:00 via INTRAVENOUS

## 2012-12-31 MED ORDER — SODIUM CHLORIDE 0.9 % IV SOLN
300.0000 mg/m2 | Freq: Once | INTRAVENOUS | Status: AC
  Administered 2012-12-31: 640 mg via INTRAVENOUS
  Filled 2012-12-31: qty 32

## 2012-12-31 MED ORDER — SODIUM CHLORIDE 0.9 % IJ SOLN
10.0000 mL | INTRAMUSCULAR | Status: DC | PRN
  Administered 2012-12-31: 10 mL
  Filled 2012-12-31: qty 10

## 2012-12-31 NOTE — Telephone Encounter (Signed)
Per staff message I have adjusted appts. JM W 

## 2012-12-31 NOTE — Progress Notes (Signed)
[facility] [phone]   [phone]  OFFICE PROGRESS NOTE  [name],[name], MD [address] [place] [address]  DIAGNOSIS: Stage IIA multiple myeloma diagnosed in July of 2013   PRIOR THERAPY:  1) Systemic chemotherapy with subcutaneous Velcade 1.3 mg/M2 on days 1, 4, 8 and 11 every 3 weeks in addition to Revlimid 25 mg by mouth daily for 14 days every 3 weeks and dexamethasone 40 mg on a weekly basis. Status post 4 planned cycles.  2) Systemic chemotherapy with subcutaneous Velcade 1.3 mg/M2 on days 1, 4, 8 and 11 every 3 weeks in addition to Revlimid 25 mg by mouth daily for 14 days every 3 weeks and dexamethasone 40 mg on a weekly basis. 4 more cycles are planned and he is status post 4 cycles of the second set of 4 cycles, now status post a total of 10 cycles   CURRENT THERAPY:  1. Systemic chemotherapy with Carfilzomib 36 mg/M2 on days 1, 2, 8, 9, 15 and 16 in addition to cyclophosphamide 300 mg/M2 IV on days 1, 8 and 15 as well as dexamethasone on days 1, 8, 15 and 22 every 4 weeks. Status post 3 cycles. 2. Zometa 4 mg IV given a monthly basis for bone disease  INTERVAL HISTORY: [name] 72 y.o. male returns to the clinic today for followup visit. The patient is feeling fine today with no specific complaints. He is tolerating his current systemic chemotherapy with Carfilzomib, cyclophosphamide and dexamethasone fairly well. He denied having any significant nausea or vomiting. He has no fatigue or weakness. Has no chest pain, shortness breath, cough or hemoptysis. He has no bleeding issues. No fever or chills.  MEDICAL HISTORY: Past Medical History  Diagnosis Date  . GERD (gastroesophageal reflux disease)   . Adenomatous polyp of colon 1996  . Arthritis     Osteoarthritis right knee, spine, wrist  . Benign prostatic hypertrophy     (Dr. Vernie Ammons)  . History of chronic prostatitis   . History of melanoma     Back (Dr. Karlyn Agee); early melanoma R arm 03/2011  . Diverticulosis   . Arthritis of hand, right     Right thumb (Dr. Teressa Senter)  . Degenerative disc disease   . Hemorrhoids   . Erosive esophagitis   . Back ache     r/o Multiple Myeloma  . Multiple myeloma 2013    ALLERGIES:  is allergic to penicillins.  MEDICATIONS:  Current Outpatient Prescriptions  Medication Sig Dispense Refill  . acyclovir (ZOVIRAX) 400 MG tablet Take 1 tablet (400 mg total) by mouth 2 (two) times daily.  60 tablet  5  . ascorbic acid (VITAMIN C) 500 MG tablet Take 500 mg by mouth daily.      Marland Kitchen aspirin EC 81 MG tablet Take 81 mg by mouth every other day.      . B Complex-C (B-COMPLEX WITH VITAMIN C) tablet Take 1 tablet by mouth daily.      . carfilzomib (KYPROLIS) 60 MG SOLR Inject into the vein.      Marland Kitchen CELEBREX 200 MG capsule Take 200 mg by mouth daily as needed for pain.       . cholecalciferol (VITAMIN D) 1000 UNITS tablet Take 1,000 Units by mouth daily.      . Cyclophosphamide (CYTOXAN LYOPHILIZED IV) Inject into the vein.      Marland Kitchen dexamethasone (DECADRON) 4 MG tablet Take 10 tablets (40 mg total) by  mouth once a week. Takes on Monday am.  120 tablet  0  . fish oil-omega-3 fatty acids 1000 MG capsule Take 1 g by mouth daily.       Marland Kitchen HYDROcodone-acetaminophen (NORCO) 7.5-325 MG per tablet Take 1 tablet by mouth every 6 (six) hours as needed for pain.       Marland Kitchen lidocaine-prilocaine (EMLA) cream Apply topically as needed. Apply to the port 1 hour before chemo  30 g  0  . Multiple Vitamin (MULTIVITAMIN) capsule Take 1 capsule by mouth daily.      . pantoprazole (PROTONIX) 40 MG tablet Take 1 tablet (40 mg total) by mouth 2 (two) times daily.  60 tablet  0  . Probiotic Product (ALIGN) 4 MG CAPS Take 1 capsule by mouth daily.      . temazepam (RESTORIL) 30 MG capsule Take 1 capsule (30 mg total) by mouth at bedtime as needed for sleep.  30 capsule  0  . zolendronic acid (ZOMETA) 4 MG/5ML injection Inject 4 mg into the vein every 28  (twenty-eight) days.      . prochlorperazine (COMPAZINE) 10 MG tablet Take 1 tablet (10 mg total) by mouth every 6 (six) hours as needed. Nausea  30 tablet  1   No current facility-administered medications for this visit.   Facility-Administered Medications Ordered in Other Visits  Medication Dose Route Frequency Provider Last Rate Last Dose  . ondansetron (ZOFRAN) tablet 8 mg  8 mg Oral Once Si Gaul, MD        SURGICAL HISTORY:  Past Surgical History  Procedure Laterality Date  . Total knee replaced  2000    Left knee  . Appendectomy    . Colonoscopy  2009  . Esophagogastroduodenoscopy  2009    mild inflammation at esophagogastric junction  . Excision of melanoma      back ( x3)  . Great toe surgery      bilateral  . Inguinal hernia repair      left, x 2  . Total knee arthroplasty  06/08/2011    Procedure: TOTAL KNEE ARTHROPLASTY;  Surgeon: Loreta Ave, MD;  Location: [facility] OR;  Service: Orthopedics;  Laterality: Right;  osteonics    REVIEW OF SYSTEMS:  A comprehensive review of systems was negative.   PHYSICAL EXAMINATION: General appearance: alert, cooperative and no distress Head: Normocephalic, without obvious abnormality, atraumatic Neck: no adenopathy Lymph nodes: Cervical, supraclavicular, and axillary nodes normal. Resp: clear to auscultation bilaterally Cardio: regular rate and rhythm, S1, S2 normal, no murmur, click, rub or gallop GI: soft, non-tender; bowel sounds normal; no masses,  no organomegaly Extremities: extremities normal, atraumatic, no cyanosis or edema  ECOG PERFORMANCE STATUS: 0 - Asymptomatic  Blood pressure 123/71, pulse 67, temperature 97 F (36.1 C), temperature source Oral, resp. rate 18, height 5\' 9"  (1.753 m), weight 210 lb 11.2 oz (95.573 kg), SpO2 100.00%.  LABORATORY DATA: Lab Results  Component Value Date   WBC 4.0 12/31/2012   HGB 11.2* 12/31/2012   HCT 33.2* 12/31/2012   MCV 92.0 12/31/2012   PLT 202 12/31/2012       Chemistry      Component Value Date/Time   NA 140 12/24/2012 0837   NA 136 10/30/2012 0650   K 3.9 12/24/2012 0837   K 3.7 10/30/2012 0650   CL 107 12/24/2012 0837   CL 107 10/30/2012 0650   CO2 25 12/24/2012 0837   CO2 25 10/30/2012 0650   BUN 17.2 12/24/2012 0837  BUN 15 10/30/2012 0650   CREATININE 0.9 12/24/2012 0837   CREATININE 0.92 10/30/2012 0650   CREATININE 0.98 01/18/2012 0934      Component Value Date/Time   CALCIUM 9.4 12/24/2012 0837   CALCIUM 8.5 10/30/2012 0650   ALKPHOS 54 12/24/2012 0837   ALKPHOS 48 02/20/2012 1317   AST 16 12/24/2012 0837   AST 38* 02/20/2012 1317   ALT 15 12/24/2012 0837   ALT 33 02/20/2012 1317   BILITOT 0.56 12/24/2012 0837   BILITOT 0.2* 02/20/2012 1317       RADIOGRAPHIC STUDIES: No results found.  ASSESSMENT AND PLAN: This is a very pleasant 72 years old white male with stage K. multiple myeloma currently on treatment with systemic chemotherapy the femoral Carfilzomib, Cytoxan and dexamethasone status post 3 cycles and he is tolerating his treatment fairly well. I recommended for the patient to continue with his current therapy as scheduled. I would see him back for followup visit in 2 weeks with the start of the next cycle of his systemic therapy. He will continue on Zometa monthly basis for the bone disease. He is scheduled for a bone marrow biopsy and aspirate on 01/19/2012 for reevaluation of his disease before consideration of the autologous peripheral blood stem cell transplant at [facility]. He was advised to call immediately if he has any concerning symptoms in the interval.  All questions were answered. The patient knows to call the clinic with any problems, questions or concerns. We can certainly see the patient much sooner if necessary.

## 2012-12-31 NOTE — Patient Instructions (Signed)
[facility] Discharge Instructions for Patients Receiving Chemotherapy  Today you received the following chemotherapy agents :  Cytoxan, Kyprolis.  To help prevent nausea and vomiting after your treatment, we encourage you to take your nausea medication as instructed by your physician.   If you develop nausea and vomiting that is not controlled by your nausea medication, call the clinic.   BELOW ARE SYMPTOMS THAT SHOULD BE REPORTED IMMEDIATELY:  *FEVER GREATER THAN 100.5 F  *CHILLS WITH OR WITHOUT FEVER  NAUSEA AND VOMITING THAT IS NOT CONTROLLED WITH YOUR NAUSEA MEDICATION  *UNUSUAL SHORTNESS OF BREATH  *UNUSUAL BRUISING OR BLEEDING  TENDERNESS IN MOUTH AND THROAT WITH OR WITHOUT PRESENCE OF ULCERS  *URINARY PROBLEMS  *BOWEL PROBLEMS  UNUSUAL RASH Items with * indicate a potential emergency and should be followed up as soon as possible.  Feel free to call the clinic you have any questions or concerns. The clinic phone number is [phone].    

## 2012-12-31 NOTE — Patient Instructions (Addendum)
Continue chemotherapy today as scheduled.  Follow up visit in 2 weeks. 

## 2013-01-01 ENCOUNTER — Ambulatory Visit (HOSPITAL_BASED_OUTPATIENT_CLINIC_OR_DEPARTMENT_OTHER): Payer: Medicare Other

## 2013-01-01 VITALS — BP 143/80 | HR 83 | Temp 97.4°F | Resp 18

## 2013-01-01 DIAGNOSIS — C9000 Multiple myeloma not having achieved remission: Secondary | ICD-10-CM

## 2013-01-01 DIAGNOSIS — Z5112 Encounter for antineoplastic immunotherapy: Secondary | ICD-10-CM

## 2013-01-01 MED ORDER — HEPARIN SOD (PORK) LOCK FLUSH 100 UNIT/ML IV SOLN
500.0000 [IU] | Freq: Once | INTRAVENOUS | Status: AC | PRN
  Administered 2013-01-01: 500 [IU]
  Filled 2013-01-01: qty 5

## 2013-01-01 MED ORDER — ONDANSETRON 8 MG/50ML IVPB (CHCC)
8.0000 mg | Freq: Once | INTRAVENOUS | Status: AC
  Administered 2013-01-01: 8 mg via INTRAVENOUS

## 2013-01-01 MED ORDER — DEXAMETHASONE SODIUM PHOSPHATE 10 MG/ML IJ SOLN
10.0000 mg | Freq: Once | INTRAMUSCULAR | Status: AC
  Administered 2013-01-01: 10 mg via INTRAVENOUS

## 2013-01-01 MED ORDER — SODIUM CHLORIDE 0.9 % IV SOLN
Freq: Once | INTRAVENOUS | Status: AC
  Administered 2013-01-01: 08:00:00 via INTRAVENOUS

## 2013-01-01 MED ORDER — DEXTROSE 5 % IV SOLN
36.0000 mg/m2 | Freq: Once | INTRAVENOUS | Status: AC
  Administered 2013-01-01: 76 mg via INTRAVENOUS
  Filled 2013-01-01: qty 38

## 2013-01-01 MED ORDER — SODIUM CHLORIDE 0.9 % IV SOLN
Freq: Once | INTRAVENOUS | Status: AC
  Administered 2013-01-01: 09:00:00 via INTRAVENOUS

## 2013-01-01 MED ORDER — SODIUM CHLORIDE 0.9 % IJ SOLN
10.0000 mL | INTRAMUSCULAR | Status: DC | PRN
  Administered 2013-01-01: 10 mL
  Filled 2013-01-01: qty 10

## 2013-01-01 NOTE — Patient Instructions (Addendum)
[facility] Discharge Instructions for Patients Receiving Chemotherapy  Today you received the following chemotherapy agents Kyprolis.  To help prevent nausea and vomiting after your treatment, we encourage you to take your nausea medication.   If you develop nausea and vomiting that is not controlled by your nausea medication, call the clinic.   BELOW ARE SYMPTOMS THAT SHOULD BE REPORTED IMMEDIATELY:  *FEVER GREATER THAN 100.5 F  *CHILLS WITH OR WITHOUT FEVER  NAUSEA AND VOMITING THAT IS NOT CONTROLLED WITH YOUR NAUSEA MEDICATION  *UNUSUAL SHORTNESS OF BREATH  *UNUSUAL BRUISING OR BLEEDING  TENDERNESS IN MOUTH AND THROAT WITH OR WITHOUT PRESENCE OF ULCERS  *URINARY PROBLEMS  *BOWEL PROBLEMS  UNUSUAL RASH Items with * indicate a potential emergency and should be followed up as soon as possible.  Feel free to call the clinic you have any questions or concerns. The clinic phone number is [phone].    

## 2013-01-14 ENCOUNTER — Ambulatory Visit (HOSPITAL_BASED_OUTPATIENT_CLINIC_OR_DEPARTMENT_OTHER): Payer: Medicare Other

## 2013-01-14 ENCOUNTER — Other Ambulatory Visit: Payer: Medicare Other | Admitting: Lab

## 2013-01-14 ENCOUNTER — Other Ambulatory Visit (HOSPITAL_BASED_OUTPATIENT_CLINIC_OR_DEPARTMENT_OTHER): Payer: Medicare Other | Admitting: Lab

## 2013-01-14 ENCOUNTER — Encounter: Payer: Self-pay | Admitting: Internal Medicine

## 2013-01-14 ENCOUNTER — Ambulatory Visit (HOSPITAL_BASED_OUTPATIENT_CLINIC_OR_DEPARTMENT_OTHER): Payer: Medicare Other | Admitting: Internal Medicine

## 2013-01-14 ENCOUNTER — Other Ambulatory Visit: Payer: Self-pay | Admitting: Medical Oncology

## 2013-01-14 VITALS — BP 124/66 | HR 67 | Temp 98.1°F | Resp 18 | Ht 69.0 in | Wt 209.9 lb

## 2013-01-14 DIAGNOSIS — C9000 Multiple myeloma not having achieved remission: Secondary | ICD-10-CM

## 2013-01-14 DIAGNOSIS — R110 Nausea: Secondary | ICD-10-CM

## 2013-01-14 DIAGNOSIS — Z5112 Encounter for antineoplastic immunotherapy: Secondary | ICD-10-CM

## 2013-01-14 DIAGNOSIS — Z5111 Encounter for antineoplastic chemotherapy: Secondary | ICD-10-CM

## 2013-01-14 LAB — COMPREHENSIVE METABOLIC PANEL (CC13)
ALT: 29 U/L (ref 0–55)
AST: 21 U/L (ref 5–34)
BUN: 14.9 mg/dL (ref 7.0–26.0)
Calcium: 9.2 mg/dL (ref 8.4–10.4)
Chloride: 106 mEq/L (ref 98–109)
Creatinine: 0.9 mg/dL (ref 0.7–1.3)
Total Bilirubin: 0.86 mg/dL (ref 0.20–1.20)

## 2013-01-14 LAB — CBC WITH DIFFERENTIAL/PLATELET
EOS%: 4.8 % (ref 0.0–7.0)
LYMPH%: 12.6 % — ABNORMAL LOW (ref 14.0–49.0)
MCH: 32.1 pg (ref 27.2–33.4)
MCV: 94 fL (ref 79.3–98.0)
MONO%: 7.6 % (ref 0.0–14.0)
RBC: 3.81 10*6/uL — ABNORMAL LOW (ref 4.20–5.82)
RDW: 16.9 % — ABNORMAL HIGH (ref 11.0–14.6)

## 2013-01-14 MED ORDER — DEXAMETHASONE 4 MG PO TABS: 40.0000 mg | ORAL_TABLET | ORAL | Status: DC

## 2013-01-14 MED ORDER — TEMAZEPAM 30 MG PO CAPS: 30.0000 mg | ORAL_CAPSULE | Freq: Every evening | ORAL | Status: DC | PRN

## 2013-01-14 MED ORDER — LIDOCAINE-PRILOCAINE 2.5-2.5 % EX CREA: TOPICAL_CREAM | CUTANEOUS | Status: DC | PRN

## 2013-01-14 MED ORDER — SODIUM CHLORIDE 0.9 % IV SOLN
300.0000 mg/m2 | Freq: Once | INTRAVENOUS | Status: AC
  Administered 2013-01-14: 640 mg via INTRAVENOUS
  Filled 2013-01-14: qty 32

## 2013-01-14 MED ORDER — HYDROCODONE-ACETAMINOPHEN 7.5-325 MG PO TABS
1.0000 | ORAL_TABLET | Freq: Four times a day (QID) | ORAL | Status: DC | PRN
Start: 2013-01-14 — End: 2013-05-28

## 2013-01-14 MED ORDER — SODIUM CHLORIDE 0.9 % IV SOLN
Freq: Once | INTRAVENOUS | Status: AC
  Administered 2013-01-14: 10:00:00 via INTRAVENOUS

## 2013-01-14 MED ORDER — HEPARIN SOD (PORK) LOCK FLUSH 100 UNIT/ML IV SOLN
500.0000 [IU] | Freq: Once | INTRAVENOUS | Status: AC | PRN
  Administered 2013-01-14: 500 [IU]
  Filled 2013-01-14: qty 5

## 2013-01-14 MED ORDER — DEXAMETHASONE SODIUM PHOSPHATE 10 MG/ML IJ SOLN
10.0000 mg | Freq: Once | INTRAMUSCULAR | Status: AC
  Administered 2013-01-14: 10 mg via INTRAVENOUS

## 2013-01-14 MED ORDER — SODIUM CHLORIDE 0.9 % IJ SOLN
10.0000 mL | INTRAMUSCULAR | Status: DC | PRN
  Administered 2013-01-14: 10 mL
  Filled 2013-01-14: qty 10

## 2013-01-14 MED ORDER — DEXTROSE 5 % IV SOLN
36.0000 mg/m2 | Freq: Once | INTRAVENOUS | Status: AC
  Administered 2013-01-14: 76 mg via INTRAVENOUS
  Filled 2013-01-14: qty 38

## 2013-01-14 MED ORDER — ONDANSETRON 8 MG/50ML IVPB (CHCC)
8.0000 mg | Freq: Once | INTRAVENOUS | Status: AC
  Administered 2013-01-14: 8 mg via INTRAVENOUS

## 2013-01-14 MED ORDER — ZOLEDRONIC ACID 4 MG/100ML IV SOLN
4.0000 mg | Freq: Once | INTRAVENOUS | Status: AC
  Administered 2013-01-14: 4 mg via INTRAVENOUS
  Filled 2013-01-14: qty 100

## 2013-01-14 MED ORDER — PROCHLORPERAZINE MALEATE 10 MG PO TABS: 10.0000 mg | ORAL_TABLET | Freq: Four times a day (QID) | ORAL | Status: DC | PRN

## 2013-01-14 MED ORDER — ACYCLOVIR 400 MG PO TABS: 400.0000 mg | ORAL_TABLET | Freq: Two times a day (BID) | ORAL | Status: DC

## 2013-01-14 NOTE — Patient Instructions (Signed)
[facility] Discharge Instructions for Patients Receiving Chemotherapy  Today you received the following chemotherapy agents Kyprolis/Cytoxan/Zometa  To help prevent nausea and vomiting after your treatment, we encourage you to take your nausea medication as prescribed.  If you develop nausea and vomiting that is not controlled by your nausea medication, call the clinic.   BELOW ARE SYMPTOMS THAT SHOULD BE REPORTED IMMEDIATELY:  *FEVER GREATER THAN 100.5 F  *CHILLS WITH OR WITHOUT FEVER  NAUSEA AND VOMITING THAT IS NOT CONTROLLED WITH YOUR NAUSEA MEDICATION  *UNUSUAL SHORTNESS OF BREATH  *UNUSUAL BRUISING OR BLEEDING  TENDERNESS IN MOUTH AND THROAT WITH OR WITHOUT PRESENCE OF ULCERS  *URINARY PROBLEMS  *BOWEL PROBLEMS  UNUSUAL RASH Items with * indicate a potential emergency and should be followed up as soon as possible.  Feel free to call the clinic you have any questions or concerns. The clinic phone number is [phone].

## 2013-01-14 NOTE — Telephone Encounter (Signed)
Per staff message I have adjusted 7/21 appt. JMW

## 2013-01-14 NOTE — Progress Notes (Signed)
[facility] Telephone:(336) [phone]   Fax:(336) [phone]  OFFICE PROGRESS NOTE  [name],[name], MD [address] [place] [address]  DIAGNOSIS: Stage IIA multiple myeloma diagnosed in July of 2013   PRIOR THERAPY:  1) Systemic chemotherapy with subcutaneous Velcade 1.3 mg/M2 on days 1, 4, 8 and 11 every 3 weeks in addition to Revlimid 25 mg by mouth daily for 14 days every 3 weeks and dexamethasone 40 mg on a weekly basis. Status post 4 planned cycles.  2) Systemic chemotherapy with subcutaneous Velcade 1.3 mg/M2 on days 1, 4, 8 and 11 every 3 weeks in addition to Revlimid 25 mg by mouth daily for 14 days every 3 weeks and dexamethasone 40 mg on a weekly basis. 4 more cycles are planned and he is status post 4 cycles of the second set of 4 cycles, now status post a total of 10 cycles   CURRENT THERAPY:  1. Systemic chemotherapy with Carfilzomib 36 mg/M2 on days 1, 2, 8, 9, 15 and 16 in addition to cyclophosphamide 300 mg/M2 IV on days 1, 8 and 15 as well as dexamethasone on days 1, 8, 15 and 22 every 4 weeks. Status post 3 cycles. 2. Zometa 4 mg IV given a monthly basis for bone disease   INTERVAL HISTORY: DEMARKO ZEIMET 72 y.o. male returns to the clinic today for followup visit. The patient is feeling fine today with no specific complaints. He is tolerating his current treatment with Carfilzomib, Cytoxan and dexamethasone fairly well. He denied having any significant nausea or vomiting. The patient denied having any significant weight loss or night sweats. He has no chest pain, shortness of breath, cough or hemoptysis. He scheduled for a bone marrow biopsy and aspirate on 01/18/2013 for evaluation of his disease before consideration of the stem cell transplant which is expected early next month.  MEDICAL HISTORY: Past Medical History  Diagnosis Date  . GERD (gastroesophageal reflux disease)   . Adenomatous polyp of colon 1996  . Arthritis    Osteoarthritis right knee, spine, wrist  . Benign prostatic hypertrophy     (Dr. Vernie Ammons)  . History of chronic prostatitis   . History of melanoma     Back (Dr. Karlyn Agee); early melanoma R arm 03/2011  . Diverticulosis   . Arthritis of hand, right     Right thumb (Dr. Teressa Senter)  . Degenerative disc disease   . Hemorrhoids   . Erosive esophagitis   . Back ache     r/o Multiple Myeloma  . Multiple myeloma 2013    ALLERGIES:  is allergic to penicillins.  MEDICATIONS:  Current Outpatient Prescriptions  Medication Sig Dispense Refill  . acyclovir (ZOVIRAX) 400 MG tablet Take 1 tablet (400 mg total) by mouth 2 (two) times daily.  60 tablet  5  . ascorbic acid (VITAMIN C) 500 MG tablet Take 500 mg by mouth daily.      Marland Kitchen aspirin EC 81 MG tablet Take 81 mg by mouth every other day.      . B Complex-C (B-COMPLEX WITH VITAMIN C) tablet Take 1 tablet by mouth daily.      . carfilzomib (KYPROLIS) 60 MG SOLR Inject into the vein.      Marland Kitchen CELEBREX 200 MG capsule Take 200 mg by mouth daily as needed for pain.       . cholecalciferol (VITAMIN D) 1000 UNITS tablet Take 1,000 Units by mouth daily.      . Cyclophosphamide (  CYTOXAN LYOPHILIZED IV) Inject into the vein.      Marland Kitchen dexamethasone (DECADRON) 4 MG tablet Take 10 tablets (40 mg total) by mouth once a week. Takes on Monday am.  120 tablet  0  . fish oil-omega-3 fatty acids 1000 MG capsule Take 1 g by mouth daily.       Marland Kitchen HYDROcodone-acetaminophen (NORCO) 7.5-325 MG per tablet Take 1 tablet by mouth every 6 (six) hours as needed for pain.       Marland Kitchen lidocaine-prilocaine (EMLA) cream Apply topically as needed. Apply to the port 1 hour before chemo  30 g  0  . Multiple Vitamin (MULTIVITAMIN) capsule Take 1 capsule by mouth daily.      . pantoprazole (PROTONIX) 40 MG tablet Take 1 tablet (40 mg total) by mouth 2 (two) times daily.  60 tablet  0  . Probiotic Product (ALIGN) 4 MG CAPS Take 1 capsule by mouth daily.      . prochlorperazine (COMPAZINE) 10  MG tablet Take 1 tablet (10 mg total) by mouth every 6 (six) hours as needed. Nausea  30 tablet  1  . temazepam (RESTORIL) 30 MG capsule Take 1 capsule (30 mg total) by mouth at bedtime as needed for sleep.  30 capsule  0  . zolendronic acid (ZOMETA) 4 MG/5ML injection Inject 4 mg into the vein every 28 (twenty-eight) days.       No current facility-administered medications for this visit.   Facility-Administered Medications Ordered in Other Visits  Medication Dose Route Frequency Provider Last Rate Last Dose  . ondansetron (ZOFRAN) tablet 8 mg  8 mg Oral Once Si Gaul, MD        SURGICAL HISTORY:  Past Surgical History  Procedure Laterality Date  . Total knee replaced  2000    Left knee  . Appendectomy    . Colonoscopy  2009  . Esophagogastroduodenoscopy  2009    mild inflammation at esophagogastric junction  . Excision of melanoma      back ( x3)  . Great toe surgery      bilateral  . Inguinal hernia repair      left, x 2  . Total knee arthroplasty  06/08/2011    Procedure: TOTAL KNEE ARTHROPLASTY;  Surgeon: Loreta Ave, MD;  Location: [facility] OR;  Service: Orthopedics;  Laterality: Right;  osteonics    REVIEW OF SYSTEMS:  A comprehensive review of systems was negative.   PHYSICAL EXAMINATION: General appearance: alert, cooperative and no distress Head: Normocephalic, without obvious abnormality, atraumatic Neck: no adenopathy Lymph nodes: Cervical, supraclavicular, and axillary nodes normal. Resp: clear to auscultation bilaterally Cardio: regular rate and rhythm, S1, S2 normal, no murmur, click, rub or gallop GI: soft, non-tender; bowel sounds normal; no masses,  no organomegaly Extremities: extremities normal, atraumatic, no cyanosis or edema  ECOG PERFORMANCE STATUS: 1 - Symptomatic but completely ambulatory  Blood pressure 124/66, pulse 67, temperature 98.1 F (36.7 C), temperature source Oral, resp. rate 18, height 5\' 9"  (1.753 m), weight 209 lb 14.4 oz (95.21  kg).  LABORATORY DATA: Lab Results  Component Value Date   WBC 4.5 01/14/2013   HGB 12.2* 01/14/2013   HCT 35.8* 01/14/2013   MCV 94.0 01/14/2013   PLT 277 01/14/2013      Chemistry      Component Value Date/Time   NA 142 12/31/2012 0759   NA 136 10/30/2012 0650   K 4.0 12/31/2012 0759   K 3.7 10/30/2012 0650   CL 109* 12/31/2012  0759   CL 107 10/30/2012 0650   CO2 26 12/31/2012 0759   CO2 25 10/30/2012 0650   BUN 11.6 12/31/2012 0759   BUN 15 10/30/2012 0650   CREATININE 1.0 12/31/2012 0759   CREATININE 0.92 10/30/2012 0650   CREATININE 0.98 01/18/2012 0934      Component Value Date/Time   CALCIUM 9.0 12/31/2012 0759   CALCIUM 8.5 10/30/2012 0650   ALKPHOS 46 12/31/2012 0759   ALKPHOS 48 02/20/2012 1317   AST 19 12/31/2012 0759   AST 38* 02/20/2012 1317   ALT 20 12/31/2012 0759   ALT 33 02/20/2012 1317   BILITOT 0.75 12/31/2012 0759   BILITOT 0.2* 02/20/2012 1317       RADIOGRAPHIC STUDIES: No results found.  ASSESSMENT AND PLAN: This is a very pleasant 72 years old white male with refractory multiple myeloma currently on treatment with Carfilzomib, Cytoxan and dexamethasone status post 3 cycles and he is currently undergoing cycle #4.  He is tolerating his treatment fairly well. The patient would have bone marrow biopsy and aspirate on 01/18/2013 followed by evaluation for peripheral blood autologous stem cell transplant. He would come back for followup visit in 2 weeks for reevaluation. He was advised to call immediately if he has any concerning symptoms in the interval.  All questions were answered. The patient knows to call the clinic with any problems, questions or concerns. We can certainly see the patient much sooner if necessary.

## 2013-01-14 NOTE — Patient Instructions (Signed)
Continue chemotherapy today as scheduled.  Follow up visit in 2 weeks. 

## 2013-01-14 NOTE — Telephone Encounter (Signed)
Called in refills for pt

## 2013-01-14 NOTE — Telephone Encounter (Signed)
GV AND PRINTED APPT SCHED AND AVS FORL PT....EMAILED MW TO MOVE 7.21.14 TX AFTER MD VISIT

## 2013-01-15 ENCOUNTER — Ambulatory Visit (HOSPITAL_BASED_OUTPATIENT_CLINIC_OR_DEPARTMENT_OTHER): Payer: Medicare Other

## 2013-01-15 VITALS — BP 128/79 | HR 76 | Temp 98.1°F | Resp 20

## 2013-01-15 DIAGNOSIS — Z5112 Encounter for antineoplastic immunotherapy: Secondary | ICD-10-CM

## 2013-01-15 DIAGNOSIS — C9000 Multiple myeloma not having achieved remission: Secondary | ICD-10-CM

## 2013-01-15 MED ORDER — DEXTROSE 5 % IV SOLN
36.0000 mg/m2 | Freq: Once | INTRAVENOUS | Status: AC
  Administered 2013-01-15: 76 mg via INTRAVENOUS
  Filled 2013-01-15: qty 38

## 2013-01-15 MED ORDER — HEPARIN SOD (PORK) LOCK FLUSH 100 UNIT/ML IV SOLN
500.0000 [IU] | Freq: Once | INTRAVENOUS | Status: AC | PRN
  Administered 2013-01-15: 500 [IU]
  Filled 2013-01-15: qty 5

## 2013-01-15 MED ORDER — SODIUM CHLORIDE 0.9 % IV SOLN
Freq: Once | INTRAVENOUS | Status: AC
  Administered 2013-01-15: 09:00:00 via INTRAVENOUS

## 2013-01-15 MED ORDER — DEXAMETHASONE SODIUM PHOSPHATE 10 MG/ML IJ SOLN
10.0000 mg | Freq: Once | INTRAMUSCULAR | Status: AC
  Administered 2013-01-15: 10 mg via INTRAVENOUS

## 2013-01-15 MED ORDER — SODIUM CHLORIDE 0.9 % IV SOLN
Freq: Once | INTRAVENOUS | Status: AC
  Administered 2013-01-15: 10:00:00 via INTRAVENOUS

## 2013-01-15 MED ORDER — SODIUM CHLORIDE 0.9 % IJ SOLN
10.0000 mL | INTRAMUSCULAR | Status: DC | PRN
  Administered 2013-01-15: 10 mL
  Filled 2013-01-15: qty 10

## 2013-01-15 MED ORDER — ONDANSETRON 8 MG/50ML IVPB (CHCC)
8.0000 mg | Freq: Once | INTRAVENOUS | Status: AC
  Administered 2013-01-15: 8 mg via INTRAVENOUS

## 2013-01-15 NOTE — Patient Instructions (Addendum)
[facility] Discharge Instructions for Patients Receiving Chemotherapy  Today you received the following chemotherapy agents: Kyprolis  To help prevent nausea and vomiting after your treatment, we encourage you to take your nausea medication: as directed.   If you develop nausea and vomiting that is not controlled by your nausea medication, call the clinic.   BELOW ARE SYMPTOMS THAT SHOULD BE REPORTED IMMEDIATELY:  *FEVER GREATER THAN 100.5 F  *CHILLS WITH OR WITHOUT FEVER  NAUSEA AND VOMITING THAT IS NOT CONTROLLED WITH YOUR NAUSEA MEDICATION  *UNUSUAL SHORTNESS OF BREATH  *UNUSUAL BRUISING OR BLEEDING  TENDERNESS IN MOUTH AND THROAT WITH OR WITHOUT PRESENCE OF ULCERS  *URINARY PROBLEMS  *BOWEL PROBLEMS  UNUSUAL RASH Items with * indicate a potential emergency and should be followed up as soon as possible.  Feel free to call the clinic you have any questions or concerns. The clinic phone number is [phone].    

## 2013-01-16 ENCOUNTER — Other Ambulatory Visit: Payer: Self-pay | Admitting: Medical Oncology

## 2013-01-16 NOTE — Progress Notes (Signed)
Short stay called and said pharmacy needs med orders for BM biopsy  Note to Dr Arbutus Ped

## 2013-01-17 ENCOUNTER — Telehealth: Payer: Self-pay | Admitting: Medical Oncology

## 2013-01-17 ENCOUNTER — Encounter (HOSPITAL_COMMUNITY): Payer: Self-pay | Admitting: Pharmacy Technician

## 2013-01-17 NOTE — Telephone Encounter (Signed)
Trey Paula called and said that his last rx should be 7/15 per Dr Lance Bosch at [facility]. Routed to Dr Arbutus Ped.

## 2013-01-17 NOTE — Telephone Encounter (Signed)
Pt called to ask if his port a cath will be used prior to bone marrow procedure so he can use EMLA cream . Per Dr Arbutus Ped port will be accessed tomorrow and pt notifed.

## 2013-01-18 ENCOUNTER — Telehealth: Payer: Self-pay | Admitting: Medical Oncology

## 2013-01-18 ENCOUNTER — Ambulatory Visit (HOSPITAL_COMMUNITY)
Admission: RE | Admit: 2013-01-18 | Discharge: 2013-01-18 | Disposition: A | Discharge status: Home / Self Care | Payer: Medicare Other | Source: Ambulatory Visit | Attending: Internal Medicine | Admitting: Internal Medicine

## 2013-01-18 ENCOUNTER — Encounter (HOSPITAL_COMMUNITY): Payer: Self-pay

## 2013-01-18 ENCOUNTER — Other Ambulatory Visit: Payer: Self-pay | Admitting: Internal Medicine

## 2013-01-18 ENCOUNTER — Other Ambulatory Visit (HOSPITAL_COMMUNITY): Payer: Self-pay | Admitting: Internal Medicine

## 2013-01-18 DIAGNOSIS — C9000 Multiple myeloma not having achieved remission: Secondary | ICD-10-CM

## 2013-01-18 DIAGNOSIS — D649 Anemia, unspecified: Secondary | ICD-10-CM | POA: Insufficient documentation

## 2013-01-18 DIAGNOSIS — D72810 Lymphocytopenia: Secondary | ICD-10-CM | POA: Insufficient documentation

## 2013-01-18 LAB — CBC WITH DIFFERENTIAL/PLATELET
Basophils Relative: 0 % (ref 0–1)
HCT: 32.4 % — ABNORMAL LOW (ref 39.0–52.0)
Hemoglobin: 10.9 g/dL — ABNORMAL LOW (ref 13.0–17.0)
Lymphs Abs: 0.6 10*3/uL — ABNORMAL LOW (ref 0.7–4.0)
MCH: 31.5 pg (ref 26.0–34.0)
MCHC: 33.6 g/dL (ref 30.0–36.0)
Monocytes Absolute: 0.5 10*3/uL (ref 0.1–1.0)
Monocytes Relative: 10 % (ref 3–12)
Neutro Abs: 3.7 10*3/uL (ref 1.7–7.7)
RBC: 3.46 MIL/uL — ABNORMAL LOW (ref 4.22–5.81)

## 2013-01-18 MED ORDER — MIDAZOLAM HCL 10 MG/2ML IJ SOLN
INTRAMUSCULAR | Status: AC
  Filled 2013-01-18: qty 2

## 2013-01-18 MED ORDER — SODIUM CHLORIDE 0.9 % IJ SOLN
10.0000 mL | Freq: Once | INTRAMUSCULAR | Status: AC
  Administered 2013-01-18: 10 mL

## 2013-01-18 MED ORDER — HEPARIN SOD (PORK) LOCK FLUSH 100 UNIT/ML IV SOLN
INTRAVENOUS | Status: AC
  Administered 2013-01-18: 500 [IU]
  Filled 2013-01-18: qty 5

## 2013-01-18 MED ORDER — HEPARIN SOD (PORK) LOCK FLUSH 100 UNIT/ML IV SOLN: 500.0000 [IU] | INTRAVENOUS | Status: DC | PRN

## 2013-01-18 MED ORDER — SODIUM CHLORIDE 0.9 % IV SOLN
Freq: Once | INTRAVENOUS | Status: AC
  Administered 2013-01-18: 08:00:00 via INTRAVENOUS

## 2013-01-18 MED ORDER — HEPARIN SOD (PORK) LOCK FLUSH 100 UNIT/ML IV SOLN: 500.0000 [IU] | INTRAVENOUS | Status: DC

## 2013-01-18 MED ORDER — MIDAZOLAM HCL 10 MG/2ML IJ SOLN: 4.0000 mg | Freq: Once | INTRAMUSCULAR | Status: DC

## 2013-01-18 MED ORDER — MIDAZOLAM HCL 5 MG/5ML IJ SOLN
INTRAMUSCULAR | Status: AC | PRN
  Administered 2013-01-18 (×2): 2 mg via INTRAVENOUS
  Administered 2013-01-18 (×3): 1 mg via INTRAVENOUS

## 2013-01-18 NOTE — ED Notes (Signed)
Patient is resting comfortably. 

## 2013-01-18 NOTE — ED Notes (Signed)
Sitting up in bed drinking cola and voicing no c/o

## 2013-01-18 NOTE — ED Notes (Signed)
Procedure ends and hypafix dressing to post iliac area pt placed supine with towel to site for pressure

## 2013-01-18 NOTE — ED Notes (Signed)
Dressing CDI 

## 2013-01-18 NOTE — ED Notes (Signed)
Vital signs stable. 

## 2013-01-18 NOTE — ED Notes (Signed)
Patient denies pain and is resting comfortably.  

## 2013-01-18 NOTE — ED Notes (Signed)
Ambulated to BR minimal assist and tolerated this well

## 2013-01-18 NOTE — ED Notes (Signed)
Post iliac dressing CDI 

## 2013-01-18 NOTE — Sedation Documentation (Signed)
Medication dose calculated and verified for: VERSED 7 mg IV 

## 2013-01-18 NOTE — Procedures (Signed)
Bone Marrow Biopsy and Aspiration Procedure Note  Mallampati's class: 1 ASA class: 1 Informed consent was obtained and potential risks including bleeding, infection and pain were reviewed with the patient.   Posterior iliac crest(s) prepped with Betadine.   Lidocaine 2% local anesthesia infiltrated into the subcutaneous tissue.  Left bone marrow biopsy and left bone marrow aspirate was obtained.   The procedure was tolerated well and there were no complications.  Specimens sent for: routine histopathologic stains and sectioning, flow cytometry, cytogenetics and molecular analysis  Physician: Iszabella Hebenstreit K. 

## 2013-01-18 NOTE — Telephone Encounter (Signed)
Kathie Rhodes called and left me a message with this  information about [facility] transplant schedule. She said she will fax this information too.  01/28/13-He is scheduled for a hickman catheter insertion. 02/07/13- Neupogen  02/08/13- Collection of cells. He needs to be off therapy for 2 weeks prior to cell collection and restart a 21 day cycle of chemo on 02/18/13. I called betty back and left message to call me. I have not yet received this information via fax.

## 2013-01-21 ENCOUNTER — Other Ambulatory Visit (HOSPITAL_BASED_OUTPATIENT_CLINIC_OR_DEPARTMENT_OTHER): Payer: Medicare Other | Admitting: Lab

## 2013-01-21 ENCOUNTER — Ambulatory Visit (HOSPITAL_BASED_OUTPATIENT_CLINIC_OR_DEPARTMENT_OTHER): Payer: Medicare Other

## 2013-01-21 VITALS — BP 132/85 | HR 64 | Temp 97.5°F | Resp 20

## 2013-01-21 DIAGNOSIS — Z5112 Encounter for antineoplastic immunotherapy: Secondary | ICD-10-CM

## 2013-01-21 DIAGNOSIS — C9000 Multiple myeloma not having achieved remission: Secondary | ICD-10-CM

## 2013-01-21 LAB — CBC WITH DIFFERENTIAL/PLATELET
Basophils Absolute: 0 10*3/uL (ref 0.0–0.1)
EOS%: 2 % (ref 0.0–7.0)
Eosinophils Absolute: 0.1 10*3/uL (ref 0.0–0.5)
HCT: 36.3 % — ABNORMAL LOW (ref 38.4–49.9)
HGB: 12.4 g/dL — ABNORMAL LOW (ref 13.0–17.1)
MCH: 32.7 pg (ref 27.2–33.4)
MONO#: 0.4 10*3/uL (ref 0.1–0.9)
NEUT#: 5.3 10*3/uL (ref 1.5–6.5)
NEUT%: 83.4 % — ABNORMAL HIGH (ref 39.0–75.0)
RDW: 15.5 % — ABNORMAL HIGH (ref 11.0–14.6)
lymph#: 0.5 10*3/uL — ABNORMAL LOW (ref 0.9–3.3)

## 2013-01-21 LAB — COMPREHENSIVE METABOLIC PANEL (CC13)
BUN: 15.6 mg/dL (ref 7.0–26.0)
CO2: 27 mEq/L (ref 22–29)
Chloride: 103 mEq/L (ref 98–109)
Creatinine: 1.1 mg/dL (ref 0.7–1.3)
Glucose: 127 mg/dl (ref 70–140)
Potassium: 4.2 mEq/L (ref 3.5–5.1)
Total Protein: 6.8 g/dL (ref 6.4–8.3)

## 2013-01-21 MED ORDER — SODIUM CHLORIDE 0.9 % IJ SOLN
10.0000 mL | INTRAMUSCULAR | Status: DC | PRN
  Administered 2013-01-21: 10 mL
  Filled 2013-01-21: qty 10

## 2013-01-21 MED ORDER — DEXTROSE 5 % IV SOLN
36.0000 mg/m2 | Freq: Once | INTRAVENOUS | Status: AC
  Administered 2013-01-21: 76 mg via INTRAVENOUS
  Filled 2013-01-21: qty 38

## 2013-01-21 MED ORDER — SODIUM CHLORIDE 0.9 % IV SOLN
300.0000 mg/m2 | Freq: Once | INTRAVENOUS | Status: AC
  Administered 2013-01-21: 640 mg via INTRAVENOUS
  Filled 2013-01-21: qty 32

## 2013-01-21 MED ORDER — HEPARIN SOD (PORK) LOCK FLUSH 100 UNIT/ML IV SOLN
500.0000 [IU] | Freq: Once | INTRAVENOUS | Status: AC | PRN
  Administered 2013-01-21: 500 [IU]
  Filled 2013-01-21: qty 5

## 2013-01-21 MED ORDER — DEXAMETHASONE SODIUM PHOSPHATE 10 MG/ML IJ SOLN
10.0000 mg | Freq: Once | INTRAMUSCULAR | Status: AC
  Administered 2013-01-21: 10 mg via INTRAVENOUS

## 2013-01-21 MED ORDER — ONDANSETRON 8 MG/50ML IVPB (CHCC)
8.0000 mg | Freq: Once | INTRAVENOUS | Status: AC
  Administered 2013-01-21: 8 mg via INTRAVENOUS

## 2013-01-21 MED ORDER — SODIUM CHLORIDE 0.9 % IV SOLN
Freq: Once | INTRAVENOUS | Status: AC
  Administered 2013-01-21: 09:00:00 via INTRAVENOUS

## 2013-01-21 NOTE — Patient Instructions (Addendum)
[facility] Discharge Instructions for Patients Receiving Chemotherapy  Today you received the following chemotherapy agents: Kyprolis and Cytoxan   To help prevent nausea and vomiting after your treatment, we encourage you to take your nausea medication as prescribed.    If you develop nausea and vomiting that is not controlled by your nausea medication, call the clinic.   BELOW ARE SYMPTOMS THAT SHOULD BE REPORTED IMMEDIATELY:  *FEVER GREATER THAN 100.5 F  *CHILLS WITH OR WITHOUT FEVER  NAUSEA AND VOMITING THAT IS NOT CONTROLLED WITH YOUR NAUSEA MEDICATION  *UNUSUAL SHORTNESS OF BREATH  *UNUSUAL BRUISING OR BLEEDING  TENDERNESS IN MOUTH AND THROAT WITH OR WITHOUT PRESENCE OF ULCERS  *URINARY PROBLEMS  *BOWEL PROBLEMS  UNUSUAL RASH Items with * indicate a potential emergency and should be followed up as soon as possible.  Feel free to call the clinic you have any questions or concerns. The clinic phone number is [phone].    

## 2013-01-22 ENCOUNTER — Ambulatory Visit (HOSPITAL_BASED_OUTPATIENT_CLINIC_OR_DEPARTMENT_OTHER): Payer: Medicare Other

## 2013-01-22 VITALS — BP 119/70 | HR 83 | Temp 96.8°F | Resp 16

## 2013-01-22 DIAGNOSIS — C9000 Multiple myeloma not having achieved remission: Secondary | ICD-10-CM

## 2013-01-22 DIAGNOSIS — Z5112 Encounter for antineoplastic immunotherapy: Secondary | ICD-10-CM

## 2013-01-22 MED ORDER — HEPARIN SOD (PORK) LOCK FLUSH 100 UNIT/ML IV SOLN
500.0000 [IU] | Freq: Once | INTRAVENOUS | Status: AC | PRN
  Administered 2013-01-22: 500 [IU]
  Filled 2013-01-22: qty 5

## 2013-01-22 MED ORDER — SODIUM CHLORIDE 0.9 % IV SOLN
Freq: Once | INTRAVENOUS | Status: AC
  Administered 2013-01-22: 08:00:00 via INTRAVENOUS

## 2013-01-22 MED ORDER — ONDANSETRON 8 MG/50ML IVPB (CHCC)
8.0000 mg | Freq: Once | INTRAVENOUS | Status: AC
  Administered 2013-01-22: 8 mg via INTRAVENOUS

## 2013-01-22 MED ORDER — SODIUM CHLORIDE 0.9 % IJ SOLN
10.0000 mL | INTRAMUSCULAR | Status: DC | PRN
  Administered 2013-01-22: 10 mL
  Filled 2013-01-22: qty 10

## 2013-01-22 MED ORDER — DEXTROSE 5 % IV SOLN
36.0000 mg/m2 | Freq: Once | INTRAVENOUS | Status: AC
  Administered 2013-01-22: 76 mg via INTRAVENOUS
  Filled 2013-01-22: qty 38

## 2013-01-22 MED ORDER — SODIUM CHLORIDE 0.9 % IV SOLN
Freq: Once | INTRAVENOUS | Status: AC
  Administered 2013-01-22: 09:00:00 via INTRAVENOUS

## 2013-01-22 MED ORDER — DEXAMETHASONE SODIUM PHOSPHATE 10 MG/ML IJ SOLN
10.0000 mg | Freq: Once | INTRAMUSCULAR | Status: AC
  Administered 2013-01-22: 10 mg via INTRAVENOUS

## 2013-01-22 NOTE — Patient Instructions (Signed)
[facility] Discharge Instructions for Patients Receiving Chemotherapy  Today you received the following chemotherapy agents :  Kyprolis.  To help prevent nausea and vomiting after your treatment, we encourage you to take your nausea medication as instructed by your physician.   If you develop nausea and vomiting that is not controlled by your nausea medication, call the clinic.   BELOW ARE SYMPTOMS THAT SHOULD BE REPORTED IMMEDIATELY:  *FEVER GREATER THAN 100.5 F  *CHILLS WITH OR WITHOUT FEVER  NAUSEA AND VOMITING THAT IS NOT CONTROLLED WITH YOUR NAUSEA MEDICATION  *UNUSUAL SHORTNESS OF BREATH  *UNUSUAL BRUISING OR BLEEDING  TENDERNESS IN MOUTH AND THROAT WITH OR WITHOUT PRESENCE OF ULCERS  *URINARY PROBLEMS  *BOWEL PROBLEMS  UNUSUAL RASH Items with * indicate a potential emergency and should be followed up as soon as possible.  Feel free to call the clinic you have any questions or concerns. The clinic phone number is [phone].    

## 2013-01-24 ENCOUNTER — Telehealth: Payer: Self-pay | Admitting: *Deleted

## 2013-01-24 NOTE — Telephone Encounter (Signed)
Per request from [facility] [place] faxed to Attn: Kathie Rhodes at [phone], call back # is [phone]  [facility]

## 2013-01-24 NOTE — Telephone Encounter (Signed)
Pt called to see if he could d/c his acyclovir and decadron.  Per dr Donnald Garre, okay to d/c these medications.  Left vm on cell phone with instructions.  SLJ

## 2013-01-28 ENCOUNTER — Ambulatory Visit (HOSPITAL_BASED_OUTPATIENT_CLINIC_OR_DEPARTMENT_OTHER): Payer: Medicare Other | Admitting: Internal Medicine

## 2013-01-28 ENCOUNTER — Encounter: Payer: Self-pay | Admitting: Internal Medicine

## 2013-01-28 ENCOUNTER — Other Ambulatory Visit (HOSPITAL_BASED_OUTPATIENT_CLINIC_OR_DEPARTMENT_OTHER): Payer: Medicare Other | Admitting: Lab

## 2013-01-28 ENCOUNTER — Ambulatory Visit: Payer: Medicare Other

## 2013-01-28 ENCOUNTER — Other Ambulatory Visit: Payer: Medicare Other | Admitting: Lab

## 2013-01-28 VITALS — BP 112/92 | HR 70 | Temp 98.1°F | Resp 18 | Ht 70.0 in | Wt 211.0 lb

## 2013-01-28 DIAGNOSIS — C9000 Multiple myeloma not having achieved remission: Secondary | ICD-10-CM

## 2013-01-28 LAB — CBC WITH DIFFERENTIAL/PLATELET
Eosinophils Absolute: 0.2 10*3/uL (ref 0.0–0.5)
LYMPH%: 12.7 % — ABNORMAL LOW (ref 14.0–49.0)
MCHC: 33.8 g/dL (ref 32.0–36.0)
MCV: 96.3 fL (ref 79.3–98.0)
MONO%: 10.1 % (ref 0.0–14.0)
Platelets: 192 10*3/uL (ref 140–400)
RBC: 3.6 10*6/uL — ABNORMAL LOW (ref 4.20–5.82)

## 2013-01-28 LAB — COMPREHENSIVE METABOLIC PANEL (CC13)
Alkaline Phosphatase: 47 U/L (ref 40–150)
Glucose: 102 mg/dl (ref 70–140)
Sodium: 139 mEq/L (ref 136–145)
Total Bilirubin: 0.75 mg/dL (ref 0.20–1.20)
Total Protein: 6.5 g/dL (ref 6.4–8.3)

## 2013-01-28 LAB — TISSUE HYBRIDIZATION (BONE MARROW)-NCBH

## 2013-01-28 NOTE — Progress Notes (Signed)
[facility] Telephone:(336) [phone]   Fax:(336) [phone]  OFFICE PROGRESS NOTE  [name],Hector A, MD [address] [place] [address]  DIAGNOSIS: Stage IIA multiple myeloma diagnosed in July of 2013   PRIOR THERAPY:  1) Systemic chemotherapy with subcutaneous Velcade 1.3 mg/M2 on days 1, 4, 8 and 11 every 3 weeks in addition to Revlimid 25 mg by mouth daily for 14 days every 3 weeks and dexamethasone 40 mg on [name] weekly basis. Status post 4 planned cycles.  2) Systemic chemotherapy with subcutaneous Velcade 1.3 mg/M2 on days 1, 4, 8 and 11 every 3 weeks in addition to Revlimid 25 mg by mouth daily for 14 days every 3 weeks and dexamethasone 40 mg on [name] weekly basis. 4 more cycles are planned and he is status post 4 cycles of the second set of 4 cycles, now status post [name] total of 10 cycles   CURRENT THERAPY:  1. Systemic chemotherapy with Carfilzomib 36 mg/M2 on days 1, 2, 8, 9, 15 and 16 in addition to cyclophosphamide 300 mg/M2 IV on days 1, 8 and 15 as well as dexamethasone on days 1, 8, 15 and 22 every 4 weeks. Status post 4 cycles. 2. Zometa 4 mg IV given [name] monthly basis for bone disease   INTERVAL HISTORY: [name] 72 y.o. male returns to the clinic today for followup visit accompanied his wife. The patient is feeling fine today with no specific complaints. He denied having any significant weight loss or night sweats. He denied having any nausea or vomiting. The patient denied having any significant chest pain, shortness of breath, cough or hemoptysis. He had repeat bone marrow biopsy and aspirate performed recently and he is here for evaluation and discussion of his biopsy results. The patient is scheduled to proceed with autologous peripheral blood stem cell transplant at [facility] later this month.  MEDICAL HISTORY: Past Medical History  Diagnosis Date  . GERD (gastroesophageal reflux disease)   . Adenomatous polyp of colon 1996  . Arthritis       Osteoarthritis right knee, spine, wrist  . Benign prostatic hypertrophy     (Dr. Vernie [name])  . History of chronic prostatitis   . History of melanoma     Back (Dr. Karlyn [name]); early melanoma R arm 03/2011  . Diverticulosis   . Arthritis of hand, right     Right thumb (Dr. Teressa [name])  . Degenerative disc disease   . Hemorrhoids   . Erosive esophagitis   . Back ache     r/o Multiple Myeloma  . Multiple myeloma 2013    ALLERGIES:  is allergic to penicillins.  MEDICATIONS:  Current Outpatient Prescriptions  Medication Sig Dispense Refill  . acyclovir (ZOVIRAX) 400 MG tablet Take 1 tablet (400 mg total) by mouth 2 (two) times daily.  60 tablet  5  . ascorbic acid (VITAMIN C) 500 MG tablet Take 500 mg by mouth daily.      Marland Kitchen aspirin EC 81 MG tablet Take 81 mg by mouth every other day.      . B Complex-C (B-COMPLEX WITH VITAMIN C) tablet Take 1 tablet by mouth daily.      . celecoxib (CELEBREX) 200 MG capsule Take 200 mg by mouth daily as needed for pain.      . cholecalciferol (VITAMIN D) 1000 UNITS tablet Take 1,000 Units by mouth daily.      Marland Kitchen dexamethasone (DECADRON) 4 MG tablet Take 10 tablets (40  mg total) by mouth once [name] week. Takes on Monday am.  120 tablet  0  . fish oil-omega-3 fatty acids 1000 MG capsule Take 1 g by mouth daily.       Marland Kitchen HYDROcodone-acetaminophen (NORCO) 7.5-325 MG per tablet Take 1 tablet by mouth every 6 (six) hours as needed for pain.  30 tablet  0  . lidocaine-prilocaine (EMLA) cream Apply topically as needed. Apply to the port 1 hour before chemo  30 g  0  . Multiple Vitamin (MULTIVITAMIN WITH MINERALS) TABS Take 1 tablet by mouth daily.      . pantoprazole (PROTONIX) 40 MG tablet Take 1 tablet (40 mg total) by mouth 2 (two) times daily.  60 tablet  0  . PRESCRIPTION MEDICATION Inject into the vein every 21 ( twenty-one) days. Kyprolis and Cytoxan infusion every 3 weeks (Monday and Tuesday)      . Probiotic Product (ALIGN) 4 MG CAPS Take 1 capsule by mouth  daily.      . prochlorperazine (COMPAZINE) 10 MG tablet Take 1 tablet (10 mg total) by mouth every 6 (six) hours as needed. Nausea  30 tablet  1  . temazepam (RESTORIL) 30 MG capsule Take 1 capsule (30 mg total) by mouth at bedtime as needed for sleep.  30 capsule  0  . zolendronic acid (ZOMETA) 4 MG/5ML injection Inject 4 mg into the vein every 28 (twenty-eight) days.       No current facility-administered medications for this visit.   Facility-Administered Medications Ordered in Other Visits  Medication Dose Route Frequency Provider Last Rate Last Dose  . ondansetron (ZOFRAN) tablet 8 mg  8 mg Oral Once Si Gaul, MD        SURGICAL HISTORY:  Past Surgical History  Procedure Laterality Date  . Total knee replaced  2000    Left knee  . Appendectomy    . Colonoscopy  2009  . Esophagogastroduodenoscopy  2009    mild inflammation at esophagogastric junction  . Excision of melanoma      back ( x3)  . Great toe surgery      bilateral  . Inguinal hernia repair      left, x 2  . Total knee arthroplasty  06/08/2011    Procedure: TOTAL KNEE ARTHROPLASTY;  Surgeon: Loreta Ave, MD;  Location: [facility] OR;  Service: Orthopedics;  Laterality: Right;  osteonics  . Port placemet  10/2012    REVIEW OF SYSTEMS:  [name] comprehensive review of systems was negative.   PHYSICAL EXAMINATION: General appearance: alert, cooperative and no distress Head: Normocephalic, without obvious abnormality, atraumatic Neck: no adenopathy Lymph nodes: Cervical, supraclavicular, and axillary nodes normal. Resp: clear to auscultation bilaterally Cardio: regular rate and rhythm, S1, S2 normal, no murmur, click, rub or gallop GI: soft, non-tender; bowel sounds normal; no masses,  no organomegaly Extremities: extremities normal, atraumatic, no cyanosis or edema Neurologic: Alert and oriented X 3, normal strength and tone. Normal symmetric reflexes. Normal coordination and gait  ECOG PERFORMANCE STATUS: 0 -  Asymptomatic  Blood pressure 112/92, pulse 70, temperature 98.1 F (36.7 C), temperature source Oral, resp. rate 18, height 5\' 10"  (1.778 m), weight 211 lb (95.709 kg), SpO2 100.00%.  LABORATORY DATA: Lab Results  Component Value Date   WBC 4.4 01/28/2013   HGB 11.7* 01/28/2013   HCT 34.7* 01/28/2013   MCV 96.3 01/28/2013   PLT 192 01/28/2013      Chemistry      Component Value Date/Time  NA 140 01/21/2013 0754   NA 136 10/30/2012 0650   K 4.2 01/21/2013 0754   K 3.7 10/30/2012 0650   CL 109* 12/31/2012 0759   CL 107 10/30/2012 0650   CO2 27 01/21/2013 0754   CO2 25 10/30/2012 0650   BUN 15.6 01/21/2013 0754   BUN 15 10/30/2012 0650   CREATININE 1.1 01/21/2013 0754   CREATININE 0.92 10/30/2012 0650   CREATININE 0.98 01/18/2012 0934      Component Value Date/Time   CALCIUM 10.0 01/21/2013 0754   CALCIUM 8.5 10/30/2012 0650   ALKPHOS 51 01/21/2013 0754   ALKPHOS 48 02/20/2012 1317   AST 16 01/21/2013 0754   AST 38* 02/20/2012 1317   ALT 22 01/21/2013 0754   ALT 33 02/20/2012 1317   BILITOT 0.75 01/21/2013 0754   BILITOT 0.2* 02/20/2012 1317       BONE MARROW REPORT FINAL DIAGNOSIS Diagnosis Bone Marrow, Aspirate,Biopsy, and Clot, left superior posterior iliac - HYPERCELLULAR BONE MARROW FOR AGE WITH RESIDUAL PLASMA CELL NEOPLASM. - SEE COMMENT. PERIPHERAL BLOOD: - NORMOCYTIC-NORMOCHROMIC ANEMIA - LYMPHOPENIA Diagnosis Note While the plasma cells represent 2% of all cells in the aspirate, there are several variably sized but predominantly small clusters of plasma cells in the clot and biopsy sections displaying an apparent lambda light chain restriction. The findings are consistent with residual plasma cell dyscrasia/neoplasm. BNS:kh 01-21-13) Hector Bruin MD Pathologist, Electronic Signature (Case signed 01/22/2013)  ASSESSMENT AND PLAN: This is [name] very pleasant 72 years old white male with stage II [name] multiple myeloma diagnosed in July of 2016 status post systemic chemotherapy with  Velcade, Revlimid and Decadron followed by 4 cycles of chemotherapy with Carfilzomib, cyclophosphamide and Decadron with significant improvement in his disease based on his recent bone marrow biopsy results as well as the myeloma panel study. I discussed the bone marrow biopsy results with the patient and his wife. I recommended for him to discontinue his treatment with Carfilzomib, Cytoxan and dexamethasone at this point in preparation for his autologous peripheral blood stem cell transplant at [facility]. The patient would come back for followup visit after the transplant. He was advised to call me immediately if he has any concerning symptoms in the interval.  All questions were answered. The patient knows to call the clinic with any problems, questions or concerns. We can certainly see the patient much sooner if necessary.  I spent 15 minutes counseling the patient face to face. The total time spent in the appointment was 25 minutes.

## 2013-01-28 NOTE — Patient Instructions (Signed)
Your bone marrow biopsy and aspirate showed significant improvement in your disease. We will discontinue chemotherapy at this point and the results are good enough for you to proceed with the autologous peripheral blood stem cell transplant at [facility]

## 2013-01-29 ENCOUNTER — Emergency Department (HOSPITAL_COMMUNITY): Payer: Medicare Other

## 2013-01-29 ENCOUNTER — Ambulatory Visit (INDEPENDENT_AMBULATORY_CARE_PROVIDER_SITE_OTHER): Payer: Medicare Other | Admitting: Physician Assistant

## 2013-01-29 ENCOUNTER — Ambulatory Visit: Payer: Medicare Other

## 2013-01-29 ENCOUNTER — Encounter (HOSPITAL_COMMUNITY): Payer: Self-pay | Admitting: *Deleted

## 2013-01-29 ENCOUNTER — Other Ambulatory Visit (INDEPENDENT_AMBULATORY_CARE_PROVIDER_SITE_OTHER): Payer: Medicare Other

## 2013-01-29 ENCOUNTER — Ambulatory Visit (INDEPENDENT_AMBULATORY_CARE_PROVIDER_SITE_OTHER)
Admission: RE | Admit: 2013-01-29 | Discharge: 2013-01-29 | Disposition: A | Discharge status: Home / Self Care | Payer: Medicare Other | Source: Ambulatory Visit | Attending: Physician Assistant | Admitting: Physician Assistant

## 2013-01-29 ENCOUNTER — Emergency Department (HOSPITAL_COMMUNITY)
Admission: EM | Admit: 2013-01-29 | Discharge: 2013-01-29 | Disposition: A | Discharge status: Home / Self Care | Payer: Medicare Other | Attending: Emergency Medicine | Admitting: Emergency Medicine

## 2013-01-29 ENCOUNTER — Encounter: Payer: Self-pay | Admitting: Gastroenterology

## 2013-01-29 ENCOUNTER — Other Ambulatory Visit: Payer: Self-pay

## 2013-01-29 ENCOUNTER — Encounter: Payer: Self-pay | Admitting: Physician Assistant

## 2013-01-29 ENCOUNTER — Ambulatory Visit: Payer: Medicare Other | Admitting: Internal Medicine

## 2013-01-29 VITALS — BP 112/72 | HR 68 | Ht 69.5 in | Wt 210.8 lb

## 2013-01-29 DIAGNOSIS — R1013 Epigastric pain: Secondary | ICD-10-CM

## 2013-01-29 DIAGNOSIS — Z872 Personal history of diseases of the skin and subcutaneous tissue: Secondary | ICD-10-CM | POA: Insufficient documentation

## 2013-01-29 DIAGNOSIS — Z860100 Personal history of colon polyps, unspecified: Secondary | ICD-10-CM | POA: Insufficient documentation

## 2013-01-29 DIAGNOSIS — K219 Gastro-esophageal reflux disease without esophagitis: Secondary | ICD-10-CM | POA: Insufficient documentation

## 2013-01-29 DIAGNOSIS — R143 Flatulence: Secondary | ICD-10-CM

## 2013-01-29 DIAGNOSIS — IMO0002 Reserved for concepts with insufficient information to code with codable children: Secondary | ICD-10-CM | POA: Insufficient documentation

## 2013-01-29 DIAGNOSIS — Z8739 Personal history of other diseases of the musculoskeletal system and connective tissue: Secondary | ICD-10-CM | POA: Insufficient documentation

## 2013-01-29 DIAGNOSIS — Z8679 Personal history of other diseases of the circulatory system: Secondary | ICD-10-CM | POA: Insufficient documentation

## 2013-01-29 DIAGNOSIS — M19049 Primary osteoarthritis, unspecified hand: Secondary | ICD-10-CM | POA: Insufficient documentation

## 2013-01-29 DIAGNOSIS — R142 Eructation: Secondary | ICD-10-CM

## 2013-01-29 DIAGNOSIS — Z8719 Personal history of other diseases of the digestive system: Secondary | ICD-10-CM | POA: Insufficient documentation

## 2013-01-29 DIAGNOSIS — C9000 Multiple myeloma not having achieved remission: Secondary | ICD-10-CM

## 2013-01-29 DIAGNOSIS — R140 Abdominal distension (gaseous): Secondary | ICD-10-CM

## 2013-01-29 DIAGNOSIS — Z87448 Personal history of other diseases of urinary system: Secondary | ICD-10-CM | POA: Insufficient documentation

## 2013-01-29 DIAGNOSIS — R0609 Other forms of dyspnea: Secondary | ICD-10-CM | POA: Insufficient documentation

## 2013-01-29 DIAGNOSIS — M1710 Unilateral primary osteoarthritis, unspecified knee: Secondary | ICD-10-CM | POA: Insufficient documentation

## 2013-01-29 DIAGNOSIS — R110 Nausea: Secondary | ICD-10-CM | POA: Insufficient documentation

## 2013-01-29 DIAGNOSIS — M1990 Unspecified osteoarthritis, unspecified site: Secondary | ICD-10-CM | POA: Insufficient documentation

## 2013-01-29 DIAGNOSIS — M19039 Primary osteoarthritis, unspecified wrist: Secondary | ICD-10-CM | POA: Insufficient documentation

## 2013-01-29 DIAGNOSIS — Z880 Allergy status to penicillin: Secondary | ICD-10-CM | POA: Insufficient documentation

## 2013-01-29 DIAGNOSIS — Z79899 Other long term (current) drug therapy: Secondary | ICD-10-CM | POA: Insufficient documentation

## 2013-01-29 DIAGNOSIS — R141 Gas pain: Secondary | ICD-10-CM

## 2013-01-29 DIAGNOSIS — Z87891 Personal history of nicotine dependence: Secondary | ICD-10-CM | POA: Insufficient documentation

## 2013-01-29 DIAGNOSIS — R0989 Other specified symptoms and signs involving the circulatory and respiratory systems: Secondary | ICD-10-CM | POA: Insufficient documentation

## 2013-01-29 LAB — CBC WITH DIFFERENTIAL/PLATELET
Eosinophils Absolute: 0.2 10*3/uL (ref 0.0–0.7)
Eosinophils Relative: 4 % (ref 0–5)
HCT: 32.3 % — ABNORMAL LOW (ref 39.0–52.0)
Hemoglobin: 11.1 g/dL — ABNORMAL LOW (ref 13.0–17.0)
Lymphs Abs: 0.8 10*3/uL (ref 0.7–4.0)
MCH: 32.6 pg (ref 26.0–34.0)
MCV: 95 fL (ref 78.0–100.0)
Monocytes Absolute: 0.4 10*3/uL (ref 0.1–1.0)
Monocytes Relative: 9 % (ref 3–12)
Platelets: 186 10*3/uL (ref 150–400)
RBC: 3.4 MIL/uL — ABNORMAL LOW (ref 4.22–5.81)

## 2013-01-29 LAB — COMPREHENSIVE METABOLIC PANEL
AST: 18 U/L (ref 0–37)
Albumin: 3.4 g/dL — ABNORMAL LOW (ref 3.5–5.2)
BUN: 18 mg/dL (ref 6–23)
Calcium: 9 mg/dL (ref 8.4–10.5)
Chloride: 104 mEq/L (ref 96–112)
Creatinine, Ser: 1.1 mg/dL (ref 0.50–1.35)
Total Bilirubin: 0.4 mg/dL (ref 0.3–1.2)

## 2013-01-29 LAB — LIPASE: Lipase: 24 U/L (ref 11.0–59.0)

## 2013-01-29 LAB — TROPONIN I: Troponin I: <0.3 ng/mL (ref <0.30)

## 2013-01-29 MED ORDER — IOHEXOL 350 MG/ML SOLN
100.0000 mL | Freq: Once | INTRAVENOUS | Status: AC | PRN
  Administered 2013-01-29: 100 mL via INTRAVENOUS

## 2013-01-29 MED ORDER — HEPARIN SOD (PORK) LOCK FLUSH 100 UNIT/ML IV SOLN
500.0000 [IU] | Freq: Once | INTRAVENOUS | Status: AC
  Administered 2013-01-29: 500 [IU] via INTRAVENOUS

## 2013-01-29 MED ORDER — MORPHINE SULFATE 4 MG/ML IJ SOLN
4.0000 mg | Freq: Once | INTRAMUSCULAR | Status: AC
  Administered 2013-01-29: 4 mg via INTRAVENOUS
  Filled 2013-01-29 (×2): qty 1

## 2013-01-29 MED ORDER — GI COCKTAIL ~~LOC~~
30.0000 mL | Freq: Once | ORAL | Status: AC
  Administered 2013-01-29: 30 mL via ORAL
  Filled 2013-01-29: qty 30

## 2013-01-29 MED ORDER — IOHEXOL 300 MG/ML  SOLN
80.0000 mL | Freq: Once | INTRAMUSCULAR | Status: AC | PRN
  Administered 2013-01-29: 80 mL via INTRAVENOUS

## 2013-01-29 NOTE — Progress Notes (Signed)
Subjective:    Patient ID: [name], male    DOB: 1941/03/20, 72 y.o.   MRN: [id number]  HPI  [name] is a very nice 72 year old white male known to Dr. Russella Dar who has history of reflux esophagitis with EGD done in May of 2009 . He has been maintained on Protonix over the past several years. He also has history of colon polyps and last had colonoscopy in May of 2009 with finding of diverticulosis and 2 small polyps one of which was hyperplastic and one adenomatous.. Patient was diagnosed with stage II A. multiple myeloma in the summer of 2013 and has been undergoing chemotherapy under the care of Dr. Arbutus Ped over the past year. He has had good response and is now set to undergo a autologous stem cell transplant at [facility] in approximately 2 weeks. He is scheduled to have a Port-A-Cath placed next week Patient went to the emergency room last night After onset of acute chest and upper abnormal pain which was associated with nausea and shortness of breath. He underwent workup with labs which were unremarkable and a CT and GI O. which was negative for pulmonary a bolus. EKG showed no acute changes and troponin was negative. Patient says he has just had an extensive cardiac evaluation through [facility] with stress testing echo etc. and everything was "fine". He reports that he has been having upper abdominal bloating distention and pressure over the past several weeks and says this is definitely been building but had acute abrupt worsening last evening. He also had radiation of pain into his back. He describes it as a tight pressure type of feeling. He did get some relief with morphine and a GI cocktail in the emergency room but says he is still having pain this morning. He has been taking Protonix twice daily, and he had also been using Celebrex fairly regularly recently and states that he has been on weekly steroids over the past year taking 40 mg of dexamethasone every Monday and then also receiving IV steroids  with his chemotherapy treatments. He denies any heartburn or indigestion no dysphagia or odynophagia, no fever or chills, no recent changes in his bowel habits melena or hematochezia though he says his bowels have been irregular.  Review of labs from the emergency room show normal liver function studies, normal WBC, hemoglobin 11 range, no lipase was done    Review of Systems  Constitutional: Negative.   HENT: Negative.   Respiratory: Positive for shortness of breath.   Cardiovascular: Negative.   Gastrointestinal: Positive for nausea, abdominal pain and abdominal distention.  Endocrine: Negative.   Genitourinary: Negative.   Musculoskeletal: Positive for back pain.  Skin: Negative.   Allergic/Immunologic: Negative.   Neurological: Negative.   Hematological: Negative.   Psychiatric/Behavioral: Negative.    Outpatient Prescriptions Prior to Visit  Medication Sig Dispense Refill  . ascorbic acid (VITAMIN C) 500 MG tablet Take 500 mg by mouth daily.      Marland Kitchen aspirin EC 81 MG tablet Take 81 mg by mouth every other day.      . B Complex-C (B-COMPLEX WITH VITAMIN C) tablet Take 1 tablet by mouth daily.      . celecoxib (CELEBREX) 200 MG capsule Take 200 mg by mouth daily as needed for pain.      . cholecalciferol (VITAMIN D) 1000 UNITS tablet Take 1,000 Units by mouth daily.      . fish oil-omega-3 fatty acids 1000 MG capsule Take 1 g by mouth daily.       Marland Kitchen  HYDROcodone-acetaminophen (NORCO) 7.5-325 MG per tablet Take 1 tablet by mouth every 6 (six) hours as needed for pain.  30 tablet  0  . lidocaine-prilocaine (EMLA) cream Apply topically as needed. Apply to the port 1 hour before chemo  30 g  0  . Multiple Vitamin (MULTIVITAMIN WITH MINERALS) TABS Take 1 tablet by mouth daily.      . pantoprazole (PROTONIX) 40 MG tablet Take 1 tablet (40 mg total) by mouth 2 (two) times daily.  60 tablet  0  . PRESCRIPTION MEDICATION Inject into the vein every 21 ( twenty-one) days. Kyprolis and Cytoxan  infusion every 3 weeks (Monday and Tuesday)      . Probiotic Product (ALIGN) 4 MG CAPS Take 1 capsule by mouth daily.      . temazepam (RESTORIL) 30 MG capsule Take 1 capsule (30 mg total) by mouth at bedtime as needed for sleep.  30 capsule  0  . zolendronic acid (ZOMETA) 4 MG/5ML injection Inject 4 mg into the vein every 28 (twenty-eight) days.      Marland Kitchen acyclovir (ZOVIRAX) 400 MG tablet Take 1 tablet (400 mg total) by mouth 2 (two) times daily.  60 tablet  5   Facility-Administered Medications Prior to Visit  Medication Dose Route Frequency Provider Last Rate Last Dose  . ondansetron (ZOFRAN) tablet 8 mg  8 mg Oral Once Si Gaul, MD       Allergies  Allergen Reactions  . Penicillins     REACTION: rash   Patient Active Problem List   Diagnosis Date Noted  . Multiple myeloma 01/27/2012  . Abnormal serum protein electrophoresis 01/18/2012  . Hyponatremia 01/16/2012  . Anemia 01/16/2012  . Dyspnea 04/18/2011  . Preop cardiovascular exam 04/18/2011  . ABDOMINAL PAIN-RUQ 04/29/2010  . IRRITABLE BOWEL SYNDROME 02/19/2009  . GERD 01/03/2008  . PERSONAL HX COLONIC POLYPS 01/03/2008   History  Substance Use Topics  . Smoking status: Former Smoker -- 3.00 packs/day for 10 years    Types: Cigarettes    Quit date: 07/11/1976  . Smokeless tobacco: Never Used     Comment: 3 PPD x 6 years  . Alcohol Use: 1.5 oz/week    3 drink(s) per week     Comment: 2 glasses of wine daily   family history includes Dementia in his mother; Diabetes in his father; Hypertension in his father; Prostate cancer in his maternal grandfather; and Stroke in his father.  There is no history of Anesthesia problems.     Objective:   Physical Exam . Well-developed white male in no acute distress, pleasant blood pressure 112/72 pulse 68 height 5 foot 9 weight 210. HEENT; nontraumatic normocephalic EOMI PERRLA sclera anicteric, Neck; supple no JVD, Cardiovascular; regular rate and rhythm with S1-S2 no murmur or  gallop, Pulmonary; clear bilaterally, Abdomen; soft bowel sounds are somewhat hyperactive he is rather diffusely tender across his upper abdomen right greater than left no palpable mass or hepatosplenomegaly no definite fluid wave, Rectal; exam not done, Extremities ;no clubbing cyanosis or edema skin warm and dry, Psych ;mood and affect normal and appropriate.     Assessment & Plan:   #42 72 year old white male with multiple myeloma who has completed one year of chemotherapy and is anticipating autologous stem cell transplant at [facility] early August 2014 #2 several week history of upper abdominal bloating distention tightness with abrupt worsening last evening prompting ER visit. Workup in the ER negative with normal troponin and negative CT angiogram. His pain today by history  and exam does appear to be GI of origin however cause un certain. He has been on steroids fairly regularly and also using NSAIDs will need to rule out peptic ulcer disease or NSAID-induced gastropathy. Also with complain of bloating and distention will need to rule out pancreatitis, ascites, gallbladder disease. #3 history of reflux esophagitis patient maintained on PPI therapy #4 history of adenomatous and hyperplastic polyps last colonoscopy 2009  Plan; add lipase to labs today Schedule for CT scan of the abdomen and pelvis this afternoon and have also tentatively scheduled him for upper endoscopy with Dr. Russella Dar tomorrow. Procedure discussed in detail with the patient and he is agreeable to proceed He will continue Protonix 40 mg by mouth twice daily He has been off of steroids over the past week and has also discontinued the Celebrex He has Norco at home and is advised to use for this one every 4-6 hours as needed for pain Patient is obviously anxious about expediting his workup and also anxious about any problems that may interfere with his being able to proceed with the stem cell transfer

## 2013-01-29 NOTE — ED Notes (Signed)
IVT notified of port accessed for f/u.

## 2013-01-29 NOTE — Patient Instructions (Addendum)
Please go to the basement level to have your labs drawn.  You have been scheduled for an endoscopy with propofol. Please follow written instructions given to you at your visit today. If you use inhalers (even only as needed), please bring them with you on the day of your procedure. Your physician has requested that you go to www.startemmi.com and enter the access code given to you at your visit today. This web site gives a general overview about your procedure. However, you should still follow specific instructions given to you by our office regarding your preparation for the procedure.   You have been scheduled for a CT scan of the abdomen and pelvis at [facility] ([address]---this is in the same building as Architectural technologist).   You are scheduled today, 7-22-2014at 1:45 PM . You should arrive at 1:30 PM  prior to your appointment time for registration. Please follow the written instructions below on the day of your exam:  WARNING: IF YOU ARE ALLERGIC TO IODINE/X-RAY DYE, PLEASE NOTIFY RADIOLOGY IMMEDIATELY AT [phone]! YOU WILL BE GIVEN A 13 HOUR PREMEDICATION PREP.  1) Do not eat or drink anything  Until after the CT scan today. 2) You have been given 2 bottles of oral contrast to drink. The solution may taste  better if refrigerated, but do NOT add ice or any other liquid to this solution. Shake well before drinking.    Drink 1 bottle of contrast @ 11:45 Am (2 hours prior to your exam)  Drink 1 bottle of contrast @ 12:45 PM  (1 hour prior to your exam)  You may take any medications as prescribed with a small amount of water except for the following: Metformin, Glucophage, Glucovance, Avandamet, Riomet, Fortamet, Actoplus Met, Janumet, Glumetza or Metaglip. The above medications must be held the day of the exam AND 48 hours after the exam.  The purpose of you drinking the oral contrast is to aid in the visualization of your intestinal tract. The contrast solution may  cause some diarrhea. Before your exam is started, you will be given a small amount of fluid to drink. Depending on your individual set of symptoms, you may also receive an intravenous injection of x-ray contrast/dye. Plan on being at [facility] for 30 minutes or long, depending on the type of exam you are having performed.  If you have any questions regarding your exam or if you need to reschedule, you may call the CT department at [phone] between the hours of 8:00 am and 5:00 pm, Monday-Friday.  ________________________________________________________________________

## 2013-01-29 NOTE — ED Notes (Addendum)
Pt states he has been having CP, SOB and nausea. Pt very anxious and states he has not had pain like this before. Pt states he cannot catch his breath. Pt states that he has been seen over the weekend, and had a recent ECHO at [facility] and stress test. Pt states his abdominal pain is severe a is well like it is exploding,  Pt states that had lab work done and is scheduled for a bone marrow transplant. [place] and was [place] time breathing [place] the [place]. Pt states he feels like he is going to pass out.

## 2013-01-29 NOTE — Progress Notes (Signed)
Reviewed and agree with management plan.  Joan Avetisyan T. Collen Vincent, MD FACG 

## 2013-01-29 NOTE — ED Provider Notes (Signed)
History    CSN: [id number] Arrival date & time 01/29/13  0059  First MD Initiated Contact with Patient 01/29/13 0116     Chief Complaint  Patient presents with  . Chest Pain   (Consider location/radiation/quality/duration/timing/severity/associated sxs/prior Treatment) Patient is a 72 y.o. male presenting with chest pain. The history is provided by the patient.  Chest Pain He is being treated for multiple myeloma and is set to go for a stem cell transplant at [facility]. He came into the ED with sudden onset of moderate chest pain. He states it felt like pressure expanding from the inside. This came on at rest. There is associated dyspnea, nausea, and he felt clammy although he was not actually diaphoretic. He has also been having problems with epigastric fullness for about the last week. He denies fever or chills and denies cough. He states that 3 days ago he had an extensive cardiac evaluation as part of his pre-transplant evaluation and that he had a normal stress test and normal echocardiogram at that time. Past Medical History  Diagnosis Date  . GERD (gastroesophageal reflux disease)   . Adenomatous polyp of colon 1996  . Arthritis     Osteoarthritis right knee, spine, wrist  . Benign prostatic hypertrophy     (Dr. Vernie Ammons)  . History of chronic prostatitis   . History of melanoma     Back (Dr. Karlyn Agee); early melanoma R arm 03/2011  . Diverticulosis   . Arthritis of hand, right     Right thumb (Dr. Teressa Senter)  . Degenerative disc disease   . Hemorrhoids   . Erosive esophagitis   . Back ache     r/o Multiple Myeloma  . Multiple myeloma 2013   Past Surgical History  Procedure Laterality Date  . Total knee replaced  2000    Left knee  . Appendectomy    . Colonoscopy  2009  . Esophagogastroduodenoscopy  2009    mild inflammation at esophagogastric junction  . Excision of melanoma      back ( x3)  . Great toe surgery      bilateral  . Inguinal hernia  repair      left, x 2  . Total knee arthroplasty  06/08/2011    Procedure: TOTAL KNEE ARTHROPLASTY;  Surgeon: Loreta Ave, MD;  Location: [facility] OR;  Service: Orthopedics;  Laterality: Right;  osteonics  . Port placemet  10/2012   Family History  Problem Relation Age of Onset  . Dementia Mother   . Stroke Father   . Hypertension Father   . Diabetes Father   . Prostate cancer Maternal Grandfather   . Anesthesia problems Neg Hx    History  Substance Use Topics  . Smoking status: Former Smoker -- 3.00 packs/day for 10 years    Types: Cigarettes    Quit date: 07/11/1976  . Smokeless tobacco: Never Used     Comment: 3 PPD x 6 years  . Alcohol Use: 1.5 oz/week    3 drink(s) per week     Comment: 2 glasses of wine daily    Review of Systems  Cardiovascular: Positive for chest pain.  All other systems reviewed and are negative.    Allergies  Penicillins  Home Medications   Current Outpatient Rx  Name  Route  Sig  Dispense  Refill  . acyclovir (ZOVIRAX) 400 MG tablet   Oral   Take 1 tablet (400 mg total) by mouth 2 (two) times daily.  60 tablet   5   . ascorbic acid (VITAMIN C) 500 MG tablet   Oral   Take 500 mg by mouth daily.         Marland Kitchen aspirin EC 81 MG tablet   Oral   Take 81 mg by mouth every other day.         . B Complex-C (B-COMPLEX WITH VITAMIN C) tablet   Oral   Take 1 tablet by mouth daily.         . celecoxib (CELEBREX) 200 MG capsule   Oral   Take 200 mg by mouth daily as needed for pain.         . cholecalciferol (VITAMIN D) 1000 UNITS tablet   Oral   Take 1,000 Units by mouth daily.         Marland Kitchen dexamethasone (DECADRON) 4 MG tablet   Oral   Take 10 tablets (40 mg total) by mouth once a week. Takes on Monday am.   120 tablet   0   . fish oil-omega-3 fatty acids 1000 MG capsule   Oral   Take 1 g by mouth daily.          Marland Kitchen HYDROcodone-acetaminophen (NORCO) 7.5-325 MG per tablet   Oral   Take 1 tablet by mouth every 6 (six) hours  as needed for pain.   30 tablet   0   . lidocaine-prilocaine (EMLA) cream   Topical   Apply topically as needed. Apply to the port 1 hour before chemo   30 g   0   . Multiple Vitamin (MULTIVITAMIN WITH MINERALS) TABS   Oral   Take 1 tablet by mouth daily.         . pantoprazole (PROTONIX) 40 MG tablet   Oral   Take 1 tablet (40 mg total) by mouth 2 (two) times daily.   60 tablet   0   . PRESCRIPTION MEDICATION   Intravenous   Inject into the vein every 21 ( twenty-one) days. Kyprolis and Cytoxan infusion every 3 weeks (Monday and Tuesday)         . Probiotic Product (ALIGN) 4 MG CAPS   Oral   Take 1 capsule by mouth daily.         . prochlorperazine (COMPAZINE) 10 MG tablet   Oral   Take 1 tablet (10 mg total) by mouth every 6 (six) hours as needed. Nausea   30 tablet   1   . temazepam (RESTORIL) 30 MG capsule   Oral   Take 1 capsule (30 mg total) by mouth at bedtime as needed for sleep.   30 capsule   0   . zolendronic acid (ZOMETA) 4 MG/5ML injection   Intravenous   Inject 4 mg into the vein every 28 (twenty-eight) days.          BP 134/73  Pulse 68  Temp(Src) 97.7 F (36.5 C) (Oral)  Resp 16  SpO2 100% Physical Exam  Nursing note and vitals reviewed.  72 year old male, resting comfortably and in no acute distress. Vital signs are normal. Oxygen saturation is 100%, which is normal. Head is normocephalic and atraumatic. PERRLA, EOMI. Oropharynx is clear. Neck is nontender and supple without adenopathy or JVD. Back is nontender and there is no CVA tenderness. Lungs are clear without rales, wheezes, or rhonchi. Chest is nontender. Heart has regular rate and rhythm without murmur. Abdomen is soft, flat, with moderate epigastric tenderness. There are no masses or hepatosplenomegaly  and peristalsis is normoactive. Extremities have no cyanosis or edema, full range of motion is present. Skin is warm and dry without rash. Neurologic: Mental status is  normal, cranial nerves are intact, there are no motor or sensory deficits.  ED Course  Procedures (including critical care time) Results for orders placed during the hospital encounter of 01/29/13  CBC WITH DIFFERENTIAL      Result Value Range   WBC 4.2  4.0 - 10.5 K/uL   RBC 3.40 (*) 4.22 - 5.81 MIL/uL   Hemoglobin 11.1 (*) 13.0 - 17.0 g/dL   HCT 78.2 (*) 95.6 - 21.3 %   MCV 95.0  78.0 - 100.0 fL   MCH 32.6  26.0 - 34.0 pg   MCHC 34.4  30.0 - 36.0 g/dL   RDW 08.6  57.8 - 46.9 %   Platelets 186  150 - 400 K/uL   Neutrophils Relative % 68  43 - 77 %   Neutro Abs 2.9  1.7 - 7.7 K/uL   Lymphocytes Relative 18  12 - 46 %   Lymphs Abs 0.8  0.7 - 4.0 K/uL   Monocytes Relative 9  3 - 12 %   Monocytes Absolute 0.4  0.1 - 1.0 K/uL   Eosinophils Relative 4  0 - 5 %   Eosinophils Absolute 0.2  0.0 - 0.7 K/uL   Basophils Relative 1  0 - 1 %   Basophils Absolute 0.0  0.0 - 0.1 K/uL  COMPREHENSIVE METABOLIC PANEL      Result Value Range   Sodium 138  135 - 145 mEq/L   Potassium 3.4 (*) 3.5 - 5.1 mEq/L   Chloride 104  96 - 112 mEq/L   CO2 28  19 - 32 mEq/L   Glucose, Bld 98  70 - 99 mg/dL   BUN 18  6 - 23 mg/dL   Creatinine, Ser 6.29  0.50 - 1.35 mg/dL   Calcium 9.0  8.4 - 52.8 mg/dL   Total Protein 6.4  6.0 - 8.3 g/dL   Albumin 3.4 (*) 3.5 - 5.2 g/dL   AST 18  0 - 37 U/L   ALT 16  0 - 53 U/L   Alkaline Phosphatase 50  39 - 117 U/L   Total Bilirubin 0.4  0.3 - 1.2 mg/dL   GFR calc non Af Amer 65 (*) >90 mL/min   GFR calc Af Amer 75 (*) >90 mL/min  TROPONIN I      Result Value Range   Troponin I <0.30  <0.30 ng/mL   Ct Angio Chest Pe W/cm &/or Wo Cm  01/29/2013   *RADIOLOGY REPORT*  Clinical Data: Left-sided chest pain and epigastric pain.  Multiple myeloma.  CT ANGIOGRAPHY CHEST  Technique:  Multidetector CT imaging of the chest using the standard protocol during bolus administration of intravenous contrast. Multiplanar reconstructed images including MIPs were obtained and reviewed  to evaluate the vascular anatomy.  Contrast: OMNIPAQUE IOHEXOL 350 MG/ML SOLN  Comparison: None.  Findings: Technically adequate study with good opacification of the central and segmental pulmonary arteries.  No focal filling defects are demonstrated.  No evidence of significant pulmonary embolus.  Normal heart size.  Normal caliber thoracic aorta.  No aortic dissection.  Esophagus is mostly decompressed.  No significant lymphadenopathy in the chest.  No pleural effusions.  Dependent atelectasis in the lungs.  Bronchial wall thickening suggesting chronic bronchitis.  No focal airspace consolidation. No interstitial infiltration.  No pneumothorax.  5 ml diameter indeterminate  nodule in the anterior right middle lung. If the patient is at high risk for bronchogenic carcinoma, follow-up chest CT at 6-12 months is recommended.  If the patient is at low risk for bronchogenic carcinoma, follow-up chest CT at 12 months is recommended.  This recommendation follows the consensus statement: Guidelines for Management of Small Pulmonary Nodules Detected on CT Scans: A Statement from the Fleischner Society as published in Radiology 2005; 237:395-400.  Visualized portions of the upper abdominal organs demonstrate a heterogeneous hypodense lesion in the posterior segment right lobe of the liver measuring 2.2 cm diameter.  This was shown to represent a hemangioma on previous CT abdomen and pelvis 11/07/2007.  Diffuse degenerative changes throughout the thoracic spine.  Normal alignment.  Mild compression deformities in the upper thoracic vertebrae.  Diffuse bone demineralization. Lucent lesions in the thoracic vertebra and shoulders may represent changes of multiple myeloma.  No acute displaced rib fractures identified.  IMPRESSION:   No evidence of significant pulmonary embolus.  No evidence of active pulmonary disease.  Diffuse bone demineralization, mild compression deformities of thoracic vertebra, and lucent lesions  likely related history of multiple myeloma.  Stable cavernous hemangioma in the liver.   Original Report Authenticated By: Burman Nieves, M.D.      Date: 01/29/2013  Rate: 68  Rhythm: normal sinus rhythm  QRS Axis: normal  Intervals: normal  ST/T Wave abnormalities: normal  Conduction Disutrbances:none  Narrative Interpretation: Normal ECG.  When compared with ECG of 10/17/2012, no significant changes are seen.  Old EKG Reviewed: unchanged   1. Epigastric pain     MDM  Chest pain of uncertain cause. I am concerned with his history of malignancy that he is at increased risk for pulmonary embolism so CT angiogram will be obtained. With recent negative stress test and normal ECG, cardiac causes somewhat unlikely. Old records are reviewed and ultrasound of abdomen 2 years ago showed normal diameter of the aorta.  CT scan shows no evidence of pulmonary emboli. He got significant relief of discomfort with a GI cocktail. It is noted that he is taking Celebrex which may be aggravating his stomach problems. He has seen a gastroenterologist in the past and is advised to followup with the gastroenterologist to consider repeat endoscopy.  Dione Booze, MD 01/29/13 [phone]

## 2013-01-30 ENCOUNTER — Ambulatory Visit (AMBULATORY_SURGERY_CENTER): Payer: Medicare Other | Admitting: Gastroenterology

## 2013-01-30 ENCOUNTER — Telehealth: Payer: Self-pay | Admitting: *Deleted

## 2013-01-30 ENCOUNTER — Encounter: Payer: Self-pay | Admitting: Gastroenterology

## 2013-01-30 VITALS — BP 132/79 | HR 57 | Temp 98.2°F | Resp 22 | Ht 69.5 in | Wt 210.0 lb

## 2013-01-30 DIAGNOSIS — R1013 Epigastric pain: Secondary | ICD-10-CM

## 2013-01-30 DIAGNOSIS — K229 Disease of esophagus, unspecified: Secondary | ICD-10-CM

## 2013-01-30 DIAGNOSIS — K219 Gastro-esophageal reflux disease without esophagitis: Secondary | ICD-10-CM

## 2013-01-30 MED ORDER — SODIUM CHLORIDE 0.9 % IV SOLN: 500.0000 mL | INTRAVENOUS | Status: DC

## 2013-01-30 NOTE — Progress Notes (Signed)
Called to room to assist during endoscopic procedure.  Patient ID and intended procedure confirmed with present staff. Received instructions for my participation in the procedure from the performing physician.  

## 2013-01-30 NOTE — Patient Instructions (Signed)

## 2013-01-30 NOTE — Op Note (Signed)
[facility] [address] [place], [address]   ENDOSCOPY PROCEDURE REPORT  PATIENT: Hector, [name]  MR#: [id number] BIRTHDATE: 02/18/1941 , 72  yrs. old GENDER: Male ENDOSCOPIST: Meryl Dare, MD, [facility] PROCEDURE DATE:  01/30/2013 PROCEDURE:  EGD w/ biopsy ASA CLASS:     Class II INDICATIONS:  Epigastric pain. MEDICATIONS: MAC sedation, administered by CRNA and propofol (Diprivan) 150mg  IV TOPICAL ANESTHETIC: Cetacaine Spray DESCRIPTION OF PROCEDURE: After the risks benefits and alternatives of the procedure were thoroughly explained, informed consent was obtained.  The LB [id number] endoscope was introduced through the mouth and advanced to the second portion of the duodenum without limitations.  The instrument was slowly withdrawn as the mucosa was fully examined.  ESOPHAGUS: There was LA Class B esophagitis noted.   A variable Z-line was observed.  Multiple biopsies were performed.   The esophagus was otherwise normal. STOMACH: The mucosa and folds of the stomach appeared normal. DUODENUM: The duodenal mucosa showed no abnormalities.  Retroflexed views revealed a small hiatal hernia.  The scope was then withdrawn from the patient and the procedure completed.  COMPLICATIONS: There were no complications.  ENDOSCOPIC IMPRESSION: 1.   LA Class B esophagitis 2.   Variable Z-line was observed; multiple biopsies 3.   Small hiatal hernia  RECOMMENDATIONS: 1.  Anti-reflux regimen with 4 " bedblocks long term 2.  Await pathology results 3.  PPI bid long term   eSigned:  Meryl Dare, MD, [facility] 01/30/2013 11:52 AM   CC:Eve Lynelle Doctor, MD

## 2013-01-30 NOTE — Progress Notes (Signed)
Patient did not experience any of the following events: a burn prior to discharge; a fall within the facility; wrong site/side/patient/procedure/implant event; or a hospital transfer or hospital admission upon discharge from the facility. (G8907) Patient did not have preoperative order for IV antibiotic SSI prophylaxis. (G8918)  

## 2013-01-30 NOTE — Telephone Encounter (Signed)
Spoke with patient and he did thank you for following up with him. He did see Dr.Starke yesterday for lower GI and this was okay. Had an endoscopy today and Dr.Starke said his esophagus was severely irritated due to GERD. He was told that sometimes this can me mistaken for heart attack. Patient is feeling much better today and again thanks you for following up.

## 2013-01-30 NOTE — Progress Notes (Signed)
Procedure ends, to recovery, report given and VSS. 

## 2013-01-31 ENCOUNTER — Encounter: Payer: Self-pay | Admitting: Family Medicine

## 2013-01-31 ENCOUNTER — Telehealth: Payer: Self-pay | Admitting: *Deleted

## 2013-01-31 NOTE — Telephone Encounter (Signed)
Patient called and stated that he was at the cancer center @ [facility] today and he had a CXR-pulm nodule was found, they are wanting to do another CXR next Monday before they start the stem cell process. He is thinking this sounds all too familiar and thought he had some Xrays/CT's with you that pulm nodule(s) may have been found on and was wanting to maybe have copies of these. Please advise. Thanks.

## 2013-01-31 NOTE — Telephone Encounter (Signed)
Had nipple shadeow noted on CXR from 01/2012 (confirmed on repeat study with nipple marker).  Copies printed and will drop off in pt's mailbox.  Pt notified.  He is scheduled for CT on Monday

## 2013-02-01 ENCOUNTER — Telehealth: Payer: Self-pay | Admitting: *Deleted

## 2013-02-01 NOTE — Telephone Encounter (Signed)
  Follow up Call-  Call back number 01/30/2013  Post procedure Call Back phone  # [phone]  Permission to leave phone message Yes     Patient questions:  Do you have a fever, pain , or abdominal swelling? no Pain Score  0 *  Have you tolerated food without any problems? yes  Have you been able to return to your normal activities? yes  Do you have any questions about your discharge instructions: Diet   no Medications  no Follow up visit  no  Do you have questions or concerns about your Care? no  Actions: * If pain score is 4 or above: No action needed, pain <4.  Pt. Stated everything is great.

## 2013-02-07 ENCOUNTER — Encounter: Payer: Self-pay | Admitting: Gastroenterology

## 2013-02-12 ENCOUNTER — Other Ambulatory Visit: Payer: Self-pay | Admitting: *Deleted

## 2013-02-13 ENCOUNTER — Telehealth: Payer: Self-pay | Admitting: Internal Medicine

## 2013-02-13 NOTE — Telephone Encounter (Signed)
s.w. pt and advised on 8.13.14 appt

## 2013-02-18 ENCOUNTER — Ambulatory Visit (INDEPENDENT_AMBULATORY_CARE_PROVIDER_SITE_OTHER): Payer: Medicare Other | Admitting: Medical

## 2013-02-18 ENCOUNTER — Encounter: Payer: Self-pay | Admitting: Medical

## 2013-02-18 VITALS — BP 120/80 | HR 72 | Temp 98.2°F | Resp 16 | Wt 208.0 lb

## 2013-02-18 DIAGNOSIS — Z4802 Encounter for removal of sutures: Secondary | ICD-10-CM

## 2013-02-18 DIAGNOSIS — H68009 Unspecified Eustachian salpingitis, unspecified ear: Secondary | ICD-10-CM

## 2013-02-18 DIAGNOSIS — H68001 Unspecified Eustachian salpingitis, right ear: Secondary | ICD-10-CM

## 2013-02-18 DIAGNOSIS — R42 Dizziness and giddiness: Secondary | ICD-10-CM

## 2013-02-18 NOTE — Progress Notes (Signed)
Subjective:   [name] is a 71 y.o. male who presents for 2 issues.  He has a Hickman Catheter in place of left neck.  Wife is caregiver and using sterile technique to change his dressing over the catheter insertion point daily.   He needs the suture at his neck removed.  He is aware of 1 suture of the left neck that has been present for 2 weeks.  He is not aware of any other suture in place.  Given his multiple myeloma, he has a stem cell transplant scheduled for 2weeks from now.   He has second c/o 2-3 wk hx/o ears popping on the left, has had some dizziness, worse with standing.  Patient denies: chills, coryza, fever , headache, low grade fever, post nasal drip, sinus pressure and sore throat. He is drinking plenty of fluids.  Treatment to date: none.  Denies sick contacts.  No other aggravating or relieving factors.  No other c/o.  ROS as in subjective  Objective:  Filed Vitals:   02/18/13 1032  BP: 120/80  Pulse: 72  Temp: 98.2 F (36.8 C)  Resp: 16    General appearance: Alert, WD/WN, no distress                             Skin: warm, no rash                           Head: no sinus tenderness                            Eyes: conjunctiva normal, corneas clear, PERRLA                            Ears: left TM pearly, right TM flat, but no erythema or effusion, external ear canals normal                          Nose: l septum midline, turbinates normal, no erythema and no discharge             Mouth/throat: MMM, tongue normal, no pharyngeal erythema                           Neck: eft lower neck with 1 black interrupted suture, supple, no adenopathy, no thyromegaly, nontender                           Assessment: Encounter Diagnoses  Name Primary?  . Eustachian salpingitis, right Yes  . Dizziness and giddiness   . Visit for suture removal     Plan: Begin Sudafed OTC x 3-5 days, hydrate well, don't make sudden movements.  Discussed diagnosis.  Call/return in 2-3 days  if symptoms aren't resolving.   Suture removal - cleaned and prepped left lower neck in usual sterile fashion.   Removed 1 simple interrupted nylon suture.  No other sutures appear present, but the catheter insertion point is covered with sterile bandage.  When his wife changes the bandage today at home with sterile technique (since we don't have replacement bandage), if she sees another suture, he will return so we can remove this suture as well.     F/u prn.

## 2013-02-18 NOTE — Patient Instructions (Signed)
Barotitis Media Barotitis media is soreness (inflammation) of the area behind the eardrum (middle ear). This occurs when the auditory tube (Eustachian tube) leading from the back of the throat to the eardrum is blocked. When it is blocked air cannot move in and out of the middle ear to equalize pressure changes. These pressure changes come from changes in altitude when:  Flying.   Driving in the mountains.   Diving.  Problems are more likely to occur with pressure changes during times when you are congested as from:  Hay fever.   Upper respiratory infection.   A cold.  Damage or hearing loss (barotrauma) caused by this may be permanent. HOME CARE INSTRUCTIONS   Use medicines as recommended by your caregiver. Over the counter medicines will help unblock the canal and can help during times of air travel.   Do not put anything into your ears to clean or unplug them. Eardrops will not be helpful.   Do not swim, dive, or fly until your caregiver says it is all right to do so. If these activities are necessary, chewing gum with frequent swallowing may help. It is also helpful to hold your nose and gently blow to pop your ears for equalizing pressure changes. This forces air into the Eustachian tube.   For little ones with problems, give your baby a bottle of water or juice during periods when pressure changes would be anticipated such as during take offs and landings associated with air travel.   Only take over-the-counter or prescription medicines for pain, discomfort, or fever as directed by your caregiver.   A decongestant may be helpful in de-congesting the middle ear and make pressure equalization easier. This can be even more effective if the drops (spray) are delivered with the head lying over the edge of a bed with the head tilted toward the ear on the affected side.   If your caregiver has given you a follow-up appointment, it is very important to keep that appointment. Not keeping  the appointment could result in a chronic or permanent injury, pain, hearing loss and disability. If there is any problem keeping the appointment, you must call back to this facility for assistance.  SEEK IMMEDIATE MEDICAL CARE IF:   You develop a severe headache, dizziness, severe ear pain, or bloody or pus-like drainage from your ears.   An oral temperature above 102 F (38.9 C) develops.   Your problems do not improve or become worse.  MAKE SURE YOU:   Understand these instructions.   Will watch your condition.   Will get help right away if you are not doing well or get worse.  Document Released: 06/24/2000 Document Revised: 03/09/2011 Document Reviewed: 01/31/2008 ExitCare Patient Information 2012 [employer]. 

## 2013-02-20 ENCOUNTER — Other Ambulatory Visit: Payer: Self-pay | Admitting: *Deleted

## 2013-02-20 ENCOUNTER — Ambulatory Visit (HOSPITAL_BASED_OUTPATIENT_CLINIC_OR_DEPARTMENT_OTHER): Payer: Medicare Other

## 2013-02-20 VITALS — BP 106/68 | HR 70 | Temp 97.1°F | Resp 16

## 2013-02-20 DIAGNOSIS — C9000 Multiple myeloma not having achieved remission: Secondary | ICD-10-CM

## 2013-02-20 DIAGNOSIS — C9001 Multiple myeloma in remission: Secondary | ICD-10-CM

## 2013-02-20 DIAGNOSIS — Z452 Encounter for adjustment and management of vascular access device: Secondary | ICD-10-CM

## 2013-02-20 MED ORDER — HEPARIN SOD (PORK) LOCK FLUSH 100 UNIT/ML IV SOLN
500.0000 [IU] | Freq: Once | INTRAVENOUS | Status: AC
  Administered 2013-02-20: 500 [IU] via INTRAVENOUS
  Filled 2013-02-20: qty 5

## 2013-02-20 MED ORDER — SODIUM CHLORIDE 0.9 % IJ SOLN
10.0000 mL | INTRAMUSCULAR | Status: DC | PRN
  Administered 2013-02-20: 10 mL via INTRAVENOUS
  Filled 2013-02-20: qty 10

## 2013-02-20 NOTE — Patient Instructions (Addendum)
Call MD for problems 

## 2013-02-25 ENCOUNTER — Ambulatory Visit (HOSPITAL_BASED_OUTPATIENT_CLINIC_OR_DEPARTMENT_OTHER): Payer: Medicare Other

## 2013-02-25 VITALS — BP 128/77 | HR 71 | Temp 96.9°F

## 2013-02-25 DIAGNOSIS — Z452 Encounter for adjustment and management of vascular access device: Secondary | ICD-10-CM

## 2013-02-25 DIAGNOSIS — C9000 Multiple myeloma not having achieved remission: Secondary | ICD-10-CM

## 2013-02-25 MED ORDER — HEPARIN SOD (PORK) LOCK FLUSH 100 UNIT/ML IV SOLN
250.0000 [IU] | Freq: Once | INTRAVENOUS | Status: AC
  Administered 2013-02-25: 250 [IU] via INTRAVENOUS
  Filled 2013-02-25: qty 5

## 2013-02-25 MED ORDER — SODIUM CHLORIDE 0.9 % IJ SOLN
10.0000 mL | INTRAMUSCULAR | Status: DC | PRN
  Administered 2013-02-25: 10 mL via INTRAVENOUS
  Filled 2013-02-25: qty 10

## 2013-02-27 ENCOUNTER — Other Ambulatory Visit: Payer: Self-pay | Admitting: *Deleted

## 2013-02-27 DIAGNOSIS — C9000 Multiple myeloma not having achieved remission: Secondary | ICD-10-CM

## 2013-02-27 MED ORDER — TEMAZEPAM 30 MG PO CAPS: 30.0000 mg | ORAL_CAPSULE | Freq: Every evening | ORAL | Status: DC | PRN

## 2013-03-04 ENCOUNTER — Ambulatory Visit (HOSPITAL_BASED_OUTPATIENT_CLINIC_OR_DEPARTMENT_OTHER): Payer: Medicare Other

## 2013-03-04 VITALS — BP 130/71 | HR 79 | Temp 98.2°F

## 2013-03-04 DIAGNOSIS — C9000 Multiple myeloma not having achieved remission: Secondary | ICD-10-CM

## 2013-03-04 DIAGNOSIS — Z452 Encounter for adjustment and management of vascular access device: Secondary | ICD-10-CM

## 2013-03-04 MED ORDER — SODIUM CHLORIDE 0.9 % IJ SOLN
10.0000 mL | INTRAMUSCULAR | Status: DC | PRN
  Administered 2013-03-04: 10 mL via INTRAVENOUS
  Filled 2013-03-04: qty 10

## 2013-03-04 MED ORDER — HEPARIN SOD (PORK) LOCK FLUSH 100 UNIT/ML IV SOLN
250.0000 [IU] | Freq: Once | INTRAVENOUS | Status: AC
  Administered 2013-03-04: 250 [IU] via INTRAVENOUS
  Filled 2013-03-04: qty 5

## 2013-03-04 NOTE — Patient Instructions (Addendum)
Implanted Port Instructions  An implanted port is a central line that has a round shape and is placed under the skin. It is used for long-term IV (intravenous) access for:  · Medicine.  · Fluids.  · Liquid nutrition, such as TPN (total parenteral nutrition).  · Blood samples.  Ports can be placed:  · In the chest area just below the collarbone (this is the most common place.)  · In the arms.  · In the belly (abdomen) area.  · In the legs.  PARTS OF THE PORT  A port has 2 main parts:  · The reservoir. The reservoir is round, disc-shaped, and will be a small, raised area under your skin.  · The reservoir is the part where a needle is inserted (accessed) to either give medicines or to draw blood.  · The catheter. The catheter is a long, slender tube that extends from the reservoir. The catheter is placed into a large vein.  · Medicine that is inserted into the reservoir goes into the catheter and then into the vein.  INSERTION OF THE PORT  · The port is surgically placed in either an operating room or in a procedural area (interventional radiology).  · Medicine may be given to help you relax during the procedure.  · The skin where the port will be inserted is numbed (local anesthetic).  · 1 or 2 small cuts (incisions) will be made in the skin to insert the port.  · The port can be used after it has been inserted.  INCISION SITE CARE  · The incision site may have small adhesive strips on it. This helps keep the incision site closed. Sometimes, no adhesive strips are placed. Instead of adhesive strips, a special kind of surgical glue is used to keep the incision closed.  · If adhesive strips were placed on the incision sites, do not take them off. They will fall off on their own.  · The incision site may be sore for 1 to 2 days. Pain medicine can help.  · Do not get the incision site wet. Bathe or shower as directed by your caregiver.  · The incision site should heal in 5 to 7 days. A small scar may form after the  incision has healed.  ACCESSING THE PORT  Special steps must be taken to access the port:  · Before the port is accessed, a numbing cream can be placed on the skin. This helps numb the skin over the port site.  · A sterile technique is used to access the port.  · The port is accessed with a needle. Only "non-coring" port needles should be used to access the port. Once the port is accessed, a blood return should be checked. This helps ensure the port is in the vein and is not clogged (clotted).  · If your caregiver believes your port should remain accessed, a clear (transparent) bandage will be placed over the needle site. The bandage and needle will need to be changed every week or as directed by your caregiver.  · Keep the bandage covering the needle clean and dry. Do not get it wet. Follow your caregiver's instructions on how to take a shower or bath when the port is accessed.  · If your port does not need to stay accessed, no bandage is needed over the port.  FLUSHING THE PORT  Flushing the port keeps it from getting clogged. How often the port is flushed depends on:  · If a   constant infusion is running. If a constant infusion is running, the port may not need to be flushed.  · If intermittent medicines are given.  · If the port is not being used.  For intermittent medicines:  · The port will need to be flushed:  · After medicines have been given.  · After blood has been drawn.  · As part of routine maintenance.  · A port is normally flushed with:  · Normal saline.  · Heparin.  · Follow your caregiver's advice on how often, how much, and the type of flush to use on your port.  IMPORTANT PORT INFORMATION  · Tell your caregiver if you are allergic to heparin.  · After your port is placed, you will get a manufacturer's information card. The card has information about your port. Keep this card with you at all times.  · There are many types of ports available. Know what kind of port you have.  · In case of an  emergency, it may be helpful to wear a medical alert bracelet. This can help alert health care workers that you have a port.  · The port can stay in for as long as your caregiver believes it is necessary.  · When it is time for the port to come out, surgery will be done to remove it. The surgery will be similar to how the port was put in.  · If you are in the hospital or clinic:  · Your port will be taken care of and flushed by a nurse.  · If you are at home:  · A home health care nurse may give medicines and take care of the port.  · You or a family member can get special training and directions for giving medicine and taking care of the port at home.  SEEK IMMEDIATE MEDICAL CARE IF:   · Your port does not flush or you are unable to get a blood return.  · New drainage or pus is coming from the incision.  · A bad smell is coming from the incision site.  · You develop swelling or increased redness at the incision site.  · You develop increased swelling or pain at the port site.  · You develop swelling or pain in the surrounding skin near the port.  · You have an oral temperature above 102° F (38.9° C), not controlled by medicine.  MAKE SURE YOU:   · Understand these instructions.  · Will watch your condition.  · Will get help right away if you are not doing well or get worse.  Document Released: 06/27/2005 Document Revised: 09/19/2011 Document Reviewed: 09/18/2008  ExitCare® Patient Information ©2014 [employer].

## 2013-03-11 HISTORY — PX: BONE MARROW TRANSPLANT: SHX200

## 2013-03-12 ENCOUNTER — Ambulatory Visit (HOSPITAL_BASED_OUTPATIENT_CLINIC_OR_DEPARTMENT_OTHER): Payer: Medicare Other

## 2013-03-12 VITALS — BP 138/84 | HR 73 | Temp 98.2°F

## 2013-03-12 DIAGNOSIS — Z452 Encounter for adjustment and management of vascular access device: Secondary | ICD-10-CM

## 2013-03-12 DIAGNOSIS — C9000 Multiple myeloma not having achieved remission: Secondary | ICD-10-CM

## 2013-03-12 MED ORDER — HEPARIN SOD (PORK) LOCK FLUSH 100 UNIT/ML IV SOLN
250.0000 [IU] | Freq: Once | INTRAVENOUS | Status: AC
  Administered 2013-03-12: 250 [IU] via INTRAVENOUS
  Filled 2013-03-12: qty 5

## 2013-03-12 MED ORDER — SODIUM CHLORIDE 0.9 % IJ SOLN
10.0000 mL | Freq: Once | INTRAMUSCULAR | Status: AC
  Administered 2013-03-12: 10 mL
  Filled 2013-03-12: qty 10

## 2013-03-12 MED ORDER — ALTEPLASE 2 MG IJ SOLR
2.0000 mg | Freq: Once | INTRAMUSCULAR | Status: AC
  Administered 2013-03-12: 2 mg
  Filled 2013-03-12: qty 2

## 2013-03-12 NOTE — Patient Instructions (Addendum)
Implanted Port Instructions  An implanted port is a central line that has a round shape and is placed under the skin. It is used for long-term IV (intravenous) access for:  · Medicine.  · Fluids.  · Liquid nutrition, such as TPN (total parenteral nutrition).  · Blood samples.  Ports can be placed:  · In the chest area just below the collarbone (this is the most common place.)  · In the arms.  · In the belly (abdomen) area.  · In the legs.  PARTS OF THE PORT  A port has 2 main parts:  · The reservoir. The reservoir is round, disc-shaped, and will be a small, raised area under your skin.  · The reservoir is the part where a needle is inserted (accessed) to either give medicines or to draw blood.  · The catheter. The catheter is a long, slender tube that extends from the reservoir. The catheter is placed into a large vein.  · Medicine that is inserted into the reservoir goes into the catheter and then into the vein.  INSERTION OF THE PORT  · The port is surgically placed in either an operating room or in a procedural area (interventional radiology).  · Medicine may be given to help you relax during the procedure.  · The skin where the port will be inserted is numbed (local anesthetic).  · 1 or 2 small cuts (incisions) will be made in the skin to insert the port.  · The port can be used after it has been inserted.  INCISION SITE CARE  · The incision site may have small adhesive strips on it. This helps keep the incision site closed. Sometimes, no adhesive strips are placed. Instead of adhesive strips, a special kind of surgical glue is used to keep the incision closed.  · If adhesive strips were placed on the incision sites, do not take them off. They will fall off on their own.  · The incision site may be sore for 1 to 2 days. Pain medicine can help.  · Do not get the incision site wet. Bathe or shower as directed by your caregiver.  · The incision site should heal in 5 to 7 days. A small scar may form after the  incision has healed.  ACCESSING THE PORT  Special steps must be taken to access the port:  · Before the port is accessed, a numbing cream can be placed on the skin. This helps numb the skin over the port site.  · A sterile technique is used to access the port.  · The port is accessed with a needle. Only "non-coring" port needles should be used to access the port. Once the port is accessed, a blood return should be checked. This helps ensure the port is in the vein and is not clogged (clotted).  · If your caregiver believes your port should remain accessed, a clear (transparent) bandage will be placed over the needle site. The bandage and needle will need to be changed every week or as directed by your caregiver.  · Keep the bandage covering the needle clean and dry. Do not get it wet. Follow your caregiver's instructions on how to take a shower or bath when the port is accessed.  · If your port does not need to stay accessed, no bandage is needed over the port.  FLUSHING THE PORT  Flushing the port keeps it from getting clogged. How often the port is flushed depends on:  · If a   constant infusion is running. If a constant infusion is running, the port may not need to be flushed.  · If intermittent medicines are given.  · If the port is not being used.  For intermittent medicines:  · The port will need to be flushed:  · After medicines have been given.  · After blood has been drawn.  · As part of routine maintenance.  · A port is normally flushed with:  · Normal saline.  · Heparin.  · Follow your caregiver's advice on how often, how much, and the type of flush to use on your port.  IMPORTANT PORT INFORMATION  · Tell your caregiver if you are allergic to heparin.  · After your port is placed, you will get a manufacturer's information card. The card has information about your port. Keep this card with you at all times.  · There are many types of ports available. Know what kind of port you have.  · In case of an  emergency, it may be helpful to wear a medical alert bracelet. This can help alert health care workers that you have a port.  · The port can stay in for as long as your caregiver believes it is necessary.  · When it is time for the port to come out, surgery will be done to remove it. The surgery will be similar to how the port was put in.  · If you are in the hospital or clinic:  · Your port will be taken care of and flushed by a nurse.  · If you are at home:  · A home health care nurse may give medicines and take care of the port.  · You or a family member can get special training and directions for giving medicine and taking care of the port at home.  SEEK IMMEDIATE MEDICAL CARE IF:   · Your port does not flush or you are unable to get a blood return.  · New drainage or pus is coming from the incision.  · A bad smell is coming from the incision site.  · You develop swelling or increased redness at the incision site.  · You develop increased swelling or pain at the port site.  · You develop swelling or pain in the surrounding skin near the port.  · You have an oral temperature above 102° F (38.9° C), not controlled by medicine.  MAKE SURE YOU:   · Understand these instructions.  · Will watch your condition.  · Will get help right away if you are not doing well or get worse.  Document Released: 06/27/2005 Document Revised: 09/19/2011 Document Reviewed: 09/18/2008  ExitCare® Patient Information ©2014 [employer].

## 2013-03-12 NOTE — Progress Notes (Signed)
Blue line flushes easily., positive blood return noted. Red line flushes easily, no blood return noted. Patient positioned several different ways no blood return noted. Gus Puma, BMT coordinator at [facility] states ok to TPA.  1416 Ok from Dr. Arbutus Ped to flush red line with TPA.  1429 Positive blood return noted. Catheter flushed per protocol.

## 2013-03-18 ENCOUNTER — Ambulatory Visit (HOSPITAL_BASED_OUTPATIENT_CLINIC_OR_DEPARTMENT_OTHER): Payer: Medicare Other

## 2013-03-18 VITALS — BP 123/69 | HR 74 | Temp 98.4°F

## 2013-03-18 DIAGNOSIS — C9000 Multiple myeloma not having achieved remission: Secondary | ICD-10-CM

## 2013-03-18 DIAGNOSIS — Z452 Encounter for adjustment and management of vascular access device: Secondary | ICD-10-CM

## 2013-03-18 MED ORDER — HEPARIN SOD (PORK) LOCK FLUSH 100 UNIT/ML IV SOLN
500.0000 [IU] | Freq: Once | INTRAVENOUS | Status: AC
  Administered 2013-03-18: 250 [IU] via INTRAVENOUS
  Filled 2013-03-18: qty 5

## 2013-03-18 MED ORDER — SODIUM CHLORIDE 0.9 % IJ SOLN
10.0000 mL | INTRAMUSCULAR | Status: DC | PRN
  Administered 2013-03-18: 10 mL via INTRAVENOUS
  Filled 2013-03-18: qty 10

## 2013-03-21 ENCOUNTER — Telehealth: Payer: Self-pay

## 2013-03-21 NOTE — Telephone Encounter (Signed)
Pt called to let us know he had his stem cell transplant yesterday. His room number at [facility] is [id number]. His direct number is [phone]. He expressed thanks to [place] for all their help so far.

## 2013-04-05 ENCOUNTER — Telehealth: Payer: Self-pay | Admitting: *Deleted

## 2013-04-05 DIAGNOSIS — C9000 Multiple myeloma not having achieved remission: Secondary | ICD-10-CM

## 2013-04-05 NOTE — Telephone Encounter (Signed)
Received call from [facility] at the post transplant dept at [facility].  She states that pt will need a lab and f/u with Dr Donnald Garre the week of 04/15/13.  Onc tx schedule filled out.  SLJ

## 2013-04-09 ENCOUNTER — Telehealth: Payer: Self-pay | Admitting: Internal Medicine

## 2013-04-09 NOTE — Telephone Encounter (Signed)
S/w the pt's wife and she is aware of the oct appts

## 2013-04-10 ENCOUNTER — Other Ambulatory Visit: Payer: Self-pay | Admitting: Medical Oncology

## 2013-04-10 ENCOUNTER — Encounter: Payer: Self-pay | Admitting: Medical Oncology

## 2013-04-10 DIAGNOSIS — C9000 Multiple myeloma not having achieved remission: Secondary | ICD-10-CM

## 2013-04-11 ENCOUNTER — Telehealth: Payer: Self-pay | Admitting: Internal Medicine

## 2013-04-15 ENCOUNTER — Other Ambulatory Visit (HOSPITAL_BASED_OUTPATIENT_CLINIC_OR_DEPARTMENT_OTHER): Payer: Medicare Other | Admitting: Lab

## 2013-04-15 ENCOUNTER — Ambulatory Visit (HOSPITAL_BASED_OUTPATIENT_CLINIC_OR_DEPARTMENT_OTHER): Payer: Medicare Other | Admitting: Internal Medicine

## 2013-04-15 ENCOUNTER — Encounter: Payer: Self-pay | Admitting: Internal Medicine

## 2013-04-15 ENCOUNTER — Ambulatory Visit (HOSPITAL_BASED_OUTPATIENT_CLINIC_OR_DEPARTMENT_OTHER): Payer: Medicare Other

## 2013-04-15 VITALS — BP 104/79 | HR 79 | Temp 97.8°F | Resp 18 | Ht 69.58 in | Wt 207.4 lb

## 2013-04-15 DIAGNOSIS — C9000 Multiple myeloma not having achieved remission: Secondary | ICD-10-CM

## 2013-04-15 DIAGNOSIS — Z452 Encounter for adjustment and management of vascular access device: Secondary | ICD-10-CM

## 2013-04-15 LAB — COMPREHENSIVE METABOLIC PANEL (CC13)
ALT: 12 U/L (ref 0–55)
Albumin: 3.1 g/dL — ABNORMAL LOW (ref 3.5–5.0)
Alkaline Phosphatase: 46 U/L (ref 40–150)
BUN: 10.4 mg/dL (ref 7.0–26.0)
CO2: 25 mEq/L (ref 22–29)
Calcium: 9.1 mg/dL (ref 8.4–10.4)
Chloride: 112 mEq/L — ABNORMAL HIGH (ref 98–109)
Creatinine: 0.7 mg/dL (ref 0.7–1.3)
Sodium: 143 mEq/L (ref 136–145)

## 2013-04-15 LAB — CBC WITH DIFFERENTIAL/PLATELET
Basophils Absolute: 0.2 10*3/uL — ABNORMAL HIGH (ref 0.0–0.1)
EOS%: 8.5 % — ABNORMAL HIGH (ref 0.0–7.0)
HCT: 35.8 % — ABNORMAL LOW (ref 38.4–49.9)
HGB: 12.1 g/dL — ABNORMAL LOW (ref 13.0–17.1)
LYMPH%: 20.1 % (ref 14.0–49.0)
MCH: 30.9 pg (ref 27.2–33.4)
MCV: 91.6 fL (ref 79.3–98.0)
MONO#: 0.7 10*3/uL (ref 0.1–0.9)
NEUT%: 55.9 % (ref 39.0–75.0)
Platelets: 187 10*3/uL (ref 140–400)
RBC: 3.91 10*6/uL — ABNORMAL LOW (ref 4.20–5.82)
lymph#: 1.1 10*3/uL (ref 0.9–3.3)

## 2013-04-15 MED ORDER — HEPARIN SOD (PORK) LOCK FLUSH 100 UNIT/ML IV SOLN
500.0000 [IU] | Freq: Once | INTRAVENOUS | Status: AC
  Administered 2013-04-15: 500 [IU] via INTRAVENOUS
  Filled 2013-04-15: qty 5

## 2013-04-15 MED ORDER — SODIUM CHLORIDE 0.9 % IJ SOLN
10.0000 mL | INTRAMUSCULAR | Status: DC | PRN
  Administered 2013-04-15: 10 mL via INTRAVENOUS
  Filled 2013-04-15: qty 10

## 2013-04-15 MED ORDER — ALTEPLASE 2 MG IJ SOLR
2.0000 mg | Freq: Once | INTRAMUSCULAR | Status: AC | PRN
  Administered 2013-04-15: 2 mg
  Filled 2013-04-15: qty 2

## 2013-04-15 NOTE — Progress Notes (Signed)
[facility] [phone]   [phone]  OFFICE PROGRESS NOTE  [name],[name], [name] [address] [place] [address]  DIAGNOSIS: Stage IIA multiple myeloma diagnosed in July of 2013   PRIOR THERAPY:  1) Systemic chemotherapy with subcutaneous Velcade 1.3 mg/M2 on days 1, 4, 8 and 11 every 3 weeks in addition to Revlimid 25 mg by mouth daily for 14 days every 3 weeks and dexamethasone 40 mg on [name] weekly basis. Status post 4 planned cycles.  2) Systemic chemotherapy with subcutaneous Velcade 1.3 mg/M2 on days 1, 4, 8 and 11 every 3 weeks in addition to Revlimid 25 mg by mouth daily for 14 days every 3 weeks and dexamethasone 40 mg on [name] weekly basis. 4 more cycles are planned and he is status post 4 cycles of the second set of 4 cycles, now status post [name] total of 10 cycles. 3) Systemic chemotherapy with Carfilzomib 36 mg/M2 on days 1, 2, 8, 9, 15 and 16 in addition to cyclophosphamide 300 mg/M2 IV on days 1, 8 and 15 as well as dexamethasone on days 1, 8, 15 and 22 every 4 weeks. Status post 4 cycles. 4) Zometa 4 mg IV every month. 5) autologous peripheral blood stem cell transplant on 03/20/2013 at [facility]  CURRENT THERAPY: Observation.   INTERVAL HISTORY: [name] 72 y.o. male returns to the clinic today for followup visit accompanied by his wife. The patient is feeling fine today with no specific complaints. He recently underwent autologous peripheral blood stem cell transplant at [facility] on 03/20/2013. The patient was discharged home 2 weeks ago. He denied having any significant chest pain, shortness of breath, cough or hemoptysis. The patient denied having any nausea or vomiting. Has no fever or chills. He is currently on prophylactic medication with acyclovir, and Bactrim DS. He is here today for evaluation and consideration of regular blood work for his condition.  MEDICAL HISTORY: Past Medical History  Diagnosis Date    . GERD (gastroesophageal reflux disease)   . Adenomatous polyp of colon 1996  . Arthritis     Osteoarthritis right knee, spine, wrist  . Benign prostatic hypertrophy     (Dr. Vernie Ammons)  . History of chronic prostatitis   . History of melanoma     Back (Dr. Karlyn Agee); early melanoma R arm 03/2011  . Diverticulosis   . Arthritis of hand, right     Right thumb (Dr. Teressa Senter)  . Degenerative disc disease   . Hemorrhoids   . Erosive esophagitis   . Back ache     r/o Multiple Myeloma  . Multiple myeloma 2013    ALLERGIES:  is allergic to penicillins.  MEDICATIONS:  Current Outpatient Prescriptions  Medication Sig Dispense Refill  . acetaminophen (TYLENOL) 325 MG tablet Take 650 mg by mouth. prn      . ascorbic acid (VITAMIN C) 500 MG tablet Take 500 mg by mouth daily.      Marland Kitchen aspirin EC 81 MG tablet Take 81 mg by mouth every other day.      . B Complex-C (B-COMPLEX WITH VITAMIN C) tablet Take 1 tablet by mouth daily.      . bacitracin ointment Apply topically. Apply as directed      . celecoxib (CELEBREX) 200 MG capsule Take 200 mg by mouth daily as needed for pain.      . cholecalciferol (VITAMIN D) 1000 UNITS tablet Take 1,000 Units by mouth  daily.      . clonazePAM (KLONOPIN) 0.5 MG tablet Take 0.5 mg by mouth. Take 1 tablet (0.5 mg total) by mouth Two (2) times [name] day.      . fish oil-omega-3 fatty acids 1000 MG capsule Take 1 g by mouth daily.       Marland Kitchen HYDROcodone-acetaminophen (NORCO) 7.5-325 MG per tablet Take 1 tablet by mouth every 6 (six) hours as needed for pain.  30 tablet  0  . lidocaine-prilocaine (EMLA) cream Apply topically as needed. Apply to the port 1 hour before chemo  30 g  0  . Multiple Vitamin (MULTIVITAMIN WITH MINERALS) TABS Take 1 tablet by mouth daily.      . ondansetron (ZOFRAN-ODT) 4 MG disintegrating tablet Take 4 mg by mouth every 12 (twelve) hours as needed for nausea.      Marland Kitchen oxycodone (OXY-IR) 5 MG capsule Take 5 mg by mouth every 6 (six) hours as needed  for pain.      . pantoprazole (PROTONIX) 40 MG tablet Take 40 mg by mouth. Take 1 tablet (40 mg total) by mouth Two (2) times [name] day.      Marland Kitchen PRESCRIPTION MEDICATION Inject into the vein every 21 ( twenty-one) days. Kyprolis and Cytoxan infusion every 3 weeks (Monday and Tuesday)      . Probiotic Product (ALIGN) 4 MG CAPS Take 1 capsule by mouth daily.      . prochlorperazine (COMPAZINE) 10 MG tablet 10 mg every 6 (six) hours as needed.       . sulfamethoxazole-trimethoprim (BACTRIM DS) 800-160 MG per tablet Take 1 tablet by mouth 2 (two) times daily. Take 1 tablet by mouth twice daily on Mondays and Thursdays.      . temazepam (RESTORIL) 15 MG capsule Take 15 mg by mouth. Take 1 capsule (15 mg total) by mouth nightly as needed for sleep.      . valACYclovir (VALTREX) 500 MG tablet Take 500 mg by mouth. Take 1 tablet (500 mg total) by mouth daily.      Marland Kitchen zolendronic acid (ZOMETA) 4 MG/5ML injection Inject 4 mg into the vein every 28 (twenty-eight) days.       No current facility-administered medications for this visit.   Facility-Administered Medications Ordered in Other Visits  Medication Dose Route Frequency Provider Last Rate Last Dose  . heparin lock flush 100 unit/mL  500 Units Intravenous Once Si Gaul, [name]      . ondansetron [facility]) tablet 8 mg  8 mg Oral Once Si Gaul, [name]      . sodium chloride 0.9 % injection 10 mL  10 mL Intravenous PRN Si Gaul, [name]        SURGICAL HISTORY:  Past Surgical History  Procedure Laterality Date  . Total knee replaced  2000    Left knee  . Appendectomy    . Colonoscopy  2009  . Esophagogastroduodenoscopy  2009    mild inflammation at esophagogastric junction  . Excision of melanoma      back ( x3)  . Great toe surgery      bilateral  . Inguinal hernia repair      left, x 2  . Total knee arthroplasty  06/08/2011    Procedure: TOTAL KNEE ARTHROPLASTY;  Surgeon: Loreta Ave, [name];  Location: [facility] OR;  Service: Orthopedics;   Laterality: Right;  osteonics  . Port placemet  10/2012    REVIEW OF SYSTEMS:  Constitutional: negative Eyes: negative Ears, nose, mouth, throat, and face:  negative Respiratory: negative Cardiovascular: negative Gastrointestinal: negative Genitourinary:negative Integument/breast: negative Hematologic/lymphatic: negative Musculoskeletal:negative Neurological: negative Behavioral/Psych: negative Endocrine: negative Allergic/Immunologic: negative   PHYSICAL EXAMINATION: General appearance: alert, cooperative and no distress Head: Normocephalic, without obvious abnormality, atraumatic Neck: no adenopathy, no JVD, supple, symmetrical, trachea midline and thyroid not enlarged, symmetric, no tenderness/mass/nodules Lymph nodes: Cervical, supraclavicular, and axillary nodes normal. Resp: clear to auscultation bilaterally Back: symmetric, no curvature. ROM normal. No CVA tenderness. Cardio: regular rate and rhythm, S1, S2 normal, no murmur, click, rub or gallop GI: soft, non-tender; bowel sounds normal; no masses,  no organomegaly Extremities: extremities normal, atraumatic, no cyanosis or edema Neurologic: Alert and oriented X 3, normal strength and tone. Normal symmetric reflexes. Normal coordination and gait  ECOG PERFORMANCE STATUS: 1 - Symptomatic but completely ambulatory  Blood pressure 104/79, pulse 79, temperature 97.8 F (36.6 C), temperature source Oral, resp. rate 18, height 5' 9.58" (1.767 m), weight 207 lb 6.4 oz (94.076 kg), SpO2 98.00%.  LABORATORY DATA: Lab Results  Component Value Date   WBC 5.5 04/15/2013   HGB 12.1* 04/15/2013   HCT 35.8* 04/15/2013   MCV 91.6 04/15/2013   PLT 187 04/15/2013      Chemistry      Component Value Date/Time   NA 143 04/15/2013 1047   NA 138 01/29/2013 0200   K 4.0 04/15/2013 1047   K 3.4* 01/29/2013 0200   CL 104 01/29/2013 0200   CL 109* 12/31/2012 0759   CO2 25 04/15/2013 1047   CO2 28 01/29/2013 0200   BUN 10.4 04/15/2013 1047    BUN 18 01/29/2013 0200   CREATININE 0.7 04/15/2013 1047   CREATININE 1.10 01/29/2013 0200   CREATININE 0.98 01/18/2012 0934      Component Value Date/Time   CALCIUM 9.1 04/15/2013 1047   CALCIUM 9.0 01/29/2013 0200   ALKPHOS 46 04/15/2013 1047   ALKPHOS 50 01/29/2013 0200   AST 18 04/15/2013 1047   AST 18 01/29/2013 0200   ALT 12 04/15/2013 1047   ALT 16 01/29/2013 0200   BILITOT 0.31 04/15/2013 1047   BILITOT 0.4 01/29/2013 0200       RADIOGRAPHIC STUDIES: No results found.  ASSESSMENT AND PLAN: This is [name] very pleasant 72 years old white male with history of multiple myeloma status post induction chemotherapy with Velcade, Revlimid and dexamethasone followed by treatment with Carfilzomib, cyclophosphamide and dexamethasone with significant improvement in his disease followed by autologous peripheral blood stem cell transplant at [facility] on 03/20/2013. The patient is doing fine today and recovering very well from his recent transplant. His lab work today is unremarkable except for mild anemia. I discussed the lab result with the patient and his wife today. I recommended for him to continue on observation for now with repeat blood work every 2 weeks for the next 3 months. I would see the patient back for followup visit in 3 months with repeat myeloma panel. He was advised to continue his prophylactic acyclovir and Bactrim DS as prescribed by the transplant team. The patient has several questions today about his condition and I answered them completely to his satisfaction. I would consider resuming his treatment with Zometa every 2 months after the next visit. I also discussed with the patient briefly the role of maintenance treatment but this would be considered after 6 months from the time of his transplant. He was advised to call immediately if he has any concerning symptoms in the interval. The patient voices understanding of current disease  status and treatment options and is in  agreement with the current care plan.  All questions were answered. The patient knows to call the clinic with any problems, questions or concerns. We can certainly see the patient much sooner if necessary.  I spent 20 minutes counseling the patient face to face. The total time spent in the appointment was 30 minutes.

## 2013-04-15 NOTE — Patient Instructions (Signed)
Continuous observation for now with repeat blood work every 2 weeks. Followup visit in 3 months with repeat myeloma panel

## 2013-04-15 NOTE — Progress Notes (Signed)
TPA removed from port with excellent blood return after 1.5 hour dwell time. Port flushed and deaccessed.

## 2013-04-18 ENCOUNTER — Telehealth: Payer: Self-pay | Admitting: Internal Medicine

## 2013-04-18 ENCOUNTER — Encounter: Payer: Self-pay | Admitting: Family Medicine

## 2013-04-18 NOTE — Telephone Encounter (Signed)
s.w. pt and advised on exteded appt...pt just wanted to ck my chart for updates.

## 2013-04-19 ENCOUNTER — Telehealth: Payer: Self-pay | Admitting: Internal Medicine

## 2013-04-19 NOTE — Telephone Encounter (Signed)
SD called pt adv of change of appt from 1201 to 1202 as per pts req shh

## 2013-04-23 ENCOUNTER — Telehealth: Payer: Self-pay | Admitting: *Deleted

## 2013-04-23 NOTE — Telephone Encounter (Signed)
Pt called wanting to know how long he has to drink bottled/ filtered water, when can be begin attending church, and wants to know how he will receive results of his lab work when he comes in every 2 weeks.  Per Dr Donnald Garre, pt needs to drink bottled/ filtered water for 1 more month, he may attend church, and when he comes in for lab work he can wait for the CBC results and can call us if he has any questions.  He verbalized understanding.  SLJ

## 2013-04-29 ENCOUNTER — Other Ambulatory Visit (HOSPITAL_BASED_OUTPATIENT_CLINIC_OR_DEPARTMENT_OTHER): Payer: Medicare Other

## 2013-04-29 DIAGNOSIS — C9000 Multiple myeloma not having achieved remission: Secondary | ICD-10-CM

## 2013-04-29 LAB — CBC WITH DIFFERENTIAL/PLATELET
Basophils Absolute: 0.1 10*3/uL (ref 0.0–0.1)
EOS%: 8.9 % — ABNORMAL HIGH (ref 0.0–7.0)
HCT: 36.8 % — ABNORMAL LOW (ref 38.4–49.9)
LYMPH%: 16.2 % (ref 14.0–49.0)
MCH: 31 pg (ref 27.2–33.4)
MCHC: 33.5 g/dL (ref 32.0–36.0)
MCV: 92.3 fL (ref 79.3–98.0)
MONO%: 8.7 % (ref 0.0–14.0)
NEUT%: 64.1 % (ref 39.0–75.0)
Platelets: 186 10*3/uL (ref 140–400)
RBC: 3.98 10*6/uL — ABNORMAL LOW (ref 4.20–5.82)
RDW: 15.4 % — ABNORMAL HIGH (ref 11.0–14.6)
WBC: 5.9 10*3/uL (ref 4.0–10.3)

## 2013-04-29 LAB — COMPREHENSIVE METABOLIC PANEL (CC13)
ALT: 18 U/L (ref 0–55)
AST: 18 U/L (ref 5–34)
Albumin: 3.4 g/dL — ABNORMAL LOW (ref 3.5–5.0)
Alkaline Phosphatase: 42 U/L (ref 40–150)
Calcium: 9 mg/dL (ref 8.4–10.4)
Chloride: 111 mEq/L — ABNORMAL HIGH (ref 98–109)
Creatinine: 0.8 mg/dL (ref 0.7–1.3)
Glucose: 94 mg/dl (ref 70–140)
Potassium: 3.9 mEq/L (ref 3.5–5.1)
Sodium: 142 mEq/L (ref 136–145)
Total Bilirubin: 0.42 mg/dL (ref 0.20–1.20)

## 2013-05-08 ENCOUNTER — Telehealth: Payer: Self-pay | Admitting: Medical Oncology

## 2013-05-08 NOTE — Telephone Encounter (Signed)
Per pt request, I rescheduled lab for next week . He also said he has cataracts and wants Dr Asa Lente opinion about having cataract surgery. I told Trey Paula I will notify Dr Arbutus Ped . I also told Trey Paula to ask Dr Lance Bosch at [facility]

## 2013-05-13 ENCOUNTER — Other Ambulatory Visit: Payer: Medicare Other

## 2013-05-14 ENCOUNTER — Other Ambulatory Visit (HOSPITAL_BASED_OUTPATIENT_CLINIC_OR_DEPARTMENT_OTHER): Payer: Medicare Other | Admitting: Lab

## 2013-05-14 DIAGNOSIS — C9000 Multiple myeloma not having achieved remission: Secondary | ICD-10-CM

## 2013-05-14 LAB — CBC WITH DIFFERENTIAL/PLATELET
BASO%: 2 % (ref 0.0–2.0)
EOS%: 7.6 % — ABNORMAL HIGH (ref 0.0–7.0)
Eosinophils Absolute: 0.4 10*3/uL (ref 0.0–0.5)
HCT: 38.8 % (ref 38.4–49.9)
LYMPH%: 19.4 % (ref 14.0–49.0)
MCHC: 33 g/dL (ref 32.0–36.0)
MCV: 91.8 fL (ref 79.3–98.0)
MONO%: 7.1 % (ref 0.0–14.0)
NEUT#: 3.1 10*3/uL (ref 1.5–6.5)
NEUT%: 63.9 % (ref 39.0–75.0)
Platelets: 186 10*3/uL (ref 140–400)
RBC: 4.23 10*6/uL (ref 4.20–5.82)

## 2013-05-14 LAB — COMPREHENSIVE METABOLIC PANEL (CC13)
ALT: 26 U/L (ref 0–55)
Anion Gap: 9 mEq/L (ref 3–11)
BUN: 11.9 mg/dL (ref 7.0–26.0)
CO2: 21 mEq/L — ABNORMAL LOW (ref 22–29)
Calcium: 9.4 mg/dL (ref 8.4–10.4)
Chloride: 108 mEq/L (ref 98–109)
Creatinine: 1 mg/dL (ref 0.7–1.3)
Total Bilirubin: 0.51 mg/dL (ref 0.20–1.20)
Total Protein: 6.3 g/dL — ABNORMAL LOW (ref 6.4–8.3)

## 2013-05-14 LAB — LACTATE DEHYDROGENASE (CC13): LDH: 161 U/L (ref 125–245)

## 2013-05-16 ENCOUNTER — Other Ambulatory Visit: Payer: Self-pay

## 2013-05-22 ENCOUNTER — Telehealth: Payer: Self-pay | Admitting: *Deleted

## 2013-05-22 NOTE — Telephone Encounter (Signed)
Office note from Dr Lance Bosch given to Dr Donnald Garre to review.  SLJ

## 2013-05-24 ENCOUNTER — Telehealth: Payer: Self-pay | Admitting: *Deleted

## 2013-05-24 NOTE — Telephone Encounter (Signed)
Pt called stating that he is scheduled to have cataracts surgery, one eye on 12/10, the other on 12/16.  He wanted to let Dr Donnald Garre.  Per Dr Donnald Garre, lab and f/u appt needs to be made so they an discuss further treatments planning.  Called and left msg to call back so we can schedule a f/u appt.  SLJ

## 2013-05-27 ENCOUNTER — Ambulatory Visit (HOSPITAL_BASED_OUTPATIENT_CLINIC_OR_DEPARTMENT_OTHER): Payer: Medicare Other

## 2013-05-27 ENCOUNTER — Other Ambulatory Visit (HOSPITAL_BASED_OUTPATIENT_CLINIC_OR_DEPARTMENT_OTHER): Payer: Medicare Other

## 2013-05-27 VITALS — BP 122/68 | HR 80 | Temp 97.8°F | Resp 16

## 2013-05-27 DIAGNOSIS — C9000 Multiple myeloma not having achieved remission: Secondary | ICD-10-CM

## 2013-05-27 DIAGNOSIS — C9001 Multiple myeloma in remission: Secondary | ICD-10-CM

## 2013-05-27 DIAGNOSIS — Z452 Encounter for adjustment and management of vascular access device: Secondary | ICD-10-CM

## 2013-05-27 LAB — COMPREHENSIVE METABOLIC PANEL (CC13)
AST: 23 U/L (ref 5–34)
Albumin: 3.6 g/dL (ref 3.5–5.0)
Alkaline Phosphatase: 45 U/L (ref 40–150)
Anion Gap: 8 mEq/L (ref 3–11)
CO2: 26 mEq/L (ref 22–29)
Potassium: 3.8 mEq/L (ref 3.5–5.1)
Sodium: 141 mEq/L (ref 136–145)
Total Bilirubin: 0.67 mg/dL (ref 0.20–1.20)
Total Protein: 6 g/dL — ABNORMAL LOW (ref 6.4–8.3)

## 2013-05-27 LAB — CBC WITH DIFFERENTIAL/PLATELET
Basophils Absolute: 0 10*3/uL (ref 0.0–0.1)
EOS%: 7.5 % — ABNORMAL HIGH (ref 0.0–7.0)
Eosinophils Absolute: 0.3 10*3/uL (ref 0.0–0.5)
HCT: 38.4 % (ref 38.4–49.9)
HGB: 12.7 g/dL — ABNORMAL LOW (ref 13.0–17.1)
MCH: 30.2 pg (ref 27.2–33.4)
MCHC: 33 g/dL (ref 32.0–36.0)
MONO#: 0.3 10*3/uL (ref 0.1–0.9)
NEUT#: 2.5 10*3/uL (ref 1.5–6.5)
NEUT%: 63.4 % (ref 39.0–75.0)
RDW: 16.7 % — ABNORMAL HIGH (ref 11.0–14.6)
lymph#: 0.9 10*3/uL (ref 0.9–3.3)

## 2013-05-27 LAB — LACTATE DEHYDROGENASE (CC13): LDH: 148 U/L (ref 125–245)

## 2013-05-27 MED ORDER — SODIUM CHLORIDE 0.9 % IJ SOLN
10.0000 mL | INTRAMUSCULAR | Status: DC | PRN
  Administered 2013-05-27: 10 mL via INTRAVENOUS
  Filled 2013-05-27: qty 10

## 2013-05-27 MED ORDER — HEPARIN SOD (PORK) LOCK FLUSH 100 UNIT/ML IV SOLN
500.0000 [IU] | Freq: Once | INTRAVENOUS | Status: AC
  Administered 2013-05-27: 500 [IU] via INTRAVENOUS
  Filled 2013-05-27: qty 5

## 2013-05-28 ENCOUNTER — Telehealth: Payer: Self-pay | Admitting: Medical Oncology

## 2013-05-28 DIAGNOSIS — C9000 Multiple myeloma not having achieved remission: Secondary | ICD-10-CM

## 2013-05-28 MED ORDER — HYDROCODONE-ACETAMINOPHEN 7.5-325 MG PO TABS: 1.0000 | ORAL_TABLET | Freq: Four times a day (QID) | ORAL | Status: DC | PRN

## 2013-05-28 NOTE — Telephone Encounter (Signed)
Trey Paula requests refill on hydrocodone . Sent to Dr Arbutus Ped for authorization. He will pick up rx on Monday  24th.

## 2013-06-03 ENCOUNTER — Telehealth: Payer: Self-pay | Admitting: Internal Medicine

## 2013-06-03 ENCOUNTER — Encounter: Payer: Self-pay | Admitting: Internal Medicine

## 2013-06-03 ENCOUNTER — Ambulatory Visit (HOSPITAL_BASED_OUTPATIENT_CLINIC_OR_DEPARTMENT_OTHER): Payer: Medicare Other | Admitting: Internal Medicine

## 2013-06-03 ENCOUNTER — Telehealth: Payer: Self-pay | Admitting: *Deleted

## 2013-06-03 VITALS — BP 133/77 | HR 73 | Temp 98.0°F | Resp 18 | Ht 69.58 in | Wt 210.8 lb

## 2013-06-03 DIAGNOSIS — C9001 Multiple myeloma in remission: Secondary | ICD-10-CM

## 2013-06-03 DIAGNOSIS — C9000 Multiple myeloma not having achieved remission: Secondary | ICD-10-CM

## 2013-06-03 DIAGNOSIS — Z9484 Stem cells transplant status: Secondary | ICD-10-CM

## 2013-06-03 NOTE — Progress Notes (Signed)
[facility] [phone]   [phone]  OFFICE PROGRESS NOTE  [name],[name], MD [address] [place] [address]  DIAGNOSIS: Stage IIA multiple myeloma diagnosed in July of 2013   PRIOR THERAPY:  1) Systemic chemotherapy with subcutaneous Velcade 1.3 mg/M2 on days 1, 4, 8 and 11 every 3 weeks in addition to Revlimid 25 mg by mouth daily for 14 days every 3 weeks and dexamethasone 40 mg on a weekly basis. Status post 4 planned cycles.  2) Systemic chemotherapy with subcutaneous Velcade 1.3 mg/M2 on days 1, 4, 8 and 11 every 3 weeks in addition to Revlimid 25 mg by mouth daily for 14 days every 3 weeks and dexamethasone 40 mg on a weekly basis. 4 more cycles are planned and he is status post 4 cycles of the second set of 4 cycles, now status post a total of 10 cycles. 3) Systemic chemotherapy with Carfilzomib 36 mg/M2 on days 1, 2, 8, 9, 15 and 16 in addition to cyclophosphamide 300 mg/M2 IV on days 1, 8 and 15 as well as dexamethasone on days 1, 8, 15 and 22 every 4 weeks. Status post 4 cycles. 4) Zometa 4 mg IV every month. 5) autologous peripheral blood stem cell transplant on 03/20/2013 at [facility]  CURRENT THERAPY: Consolidation chemotherapy with Carfilzomib 36 mg/M2 on days 1, 2, 8, 9, 15 and 16 every 4 weeks in addition to cyclophosphamide 300 mg/M2 on days 1, 8 and 15 as well as dexamethasone 40 mg on a weekly basis every 4 weeks. First cycle 06/11/2013.   INTERVAL HISTORY: [name] 72 y.o. male returns to the clinic today for followup visit accompanied by his wife. The patient is feeling fine today with no specific complaints. He recently underwent autologous peripheral blood stem cell transplant at [facility] on 03/20/2013. He was seen recently by Dr. Lance Bosch at [facility] and he recommended for him to proceed with 2 cycles of consolidation chemotherapy with Carfilzomib, dexamethasone and cyclophosphamide. He is  here today for evaluation and discussion of his treatment option.   He denied having any significant chest pain, shortness of breath, cough or hemoptysis. The patient denied having any nausea or vomiting. Has no fever or chills. He is currently on prophylactic medication with acyclovir but discontinued Bactrim DS.   MEDICAL HISTORY: Past Medical History  Diagnosis Date  . GERD (gastroesophageal reflux disease)   . Adenomatous polyp of colon 1996  . Arthritis     Osteoarthritis right knee, spine, wrist  . Benign prostatic hypertrophy     (Dr. Vernie Ammons)  . History of chronic prostatitis   . History of melanoma     Back (Dr. Karlyn Agee); early melanoma R arm 03/2011  . Diverticulosis   . Arthritis of hand, right     Right thumb (Dr. Teressa Senter)  . Degenerative disc disease   . Hemorrhoids   . Erosive esophagitis   . Back ache     r/o Multiple Myeloma  . Multiple myeloma 2013    ALLERGIES:  is allergic to penicillins.  MEDICATIONS:  Current Outpatient Prescriptions  Medication Sig Dispense Refill  . acetaminophen (TYLENOL) 325 MG tablet Take 650 mg by mouth. prn      . ascorbic acid (VITAMIN C) 500 MG tablet Take 500 mg by mouth daily.      Marland Kitchen aspirin EC 81 MG tablet Take 81 mg by mouth every other day.      Marland Kitchen  B Complex-C (B-COMPLEX WITH VITAMIN C) tablet Take 1 tablet by mouth daily.      . bacitracin ointment Apply topically. Apply as directed      . celecoxib (CELEBREX) 200 MG capsule Take 200 mg by mouth daily as needed for pain.      . cholecalciferol (VITAMIN D) 1000 UNITS tablet Take 1,000 Units by mouth daily.      . clonazePAM (KLONOPIN) 0.5 MG tablet Take 0.5 mg by mouth. Take 1 tablet (0.5 mg total) by mouth Two (2) times a day.      . fish oil-omega-3 fatty acids 1000 MG capsule Take 1 g by mouth daily.       Marland Kitchen HYDROcodone-acetaminophen (NORCO) 7.5-325 MG per tablet Take 1 tablet by mouth every 6 (six) hours as needed.  30 tablet  0  . lidocaine-prilocaine (EMLA) cream Apply  topically as needed. Apply to the port 1 hour before chemo  30 g  0  . Multiple Vitamin (MULTIVITAMIN WITH MINERALS) TABS Take 1 tablet by mouth daily.      . ondansetron (ZOFRAN-ODT) 4 MG disintegrating tablet Take 4 mg by mouth every 12 (twelve) hours as needed for nausea.      Marland Kitchen oxycodone (OXY-IR) 5 MG capsule Take 5 mg by mouth every 6 (six) hours as needed for pain.      . pantoprazole (PROTONIX) 40 MG tablet Take 40 mg by mouth. Take 1 tablet (40 mg total) by mouth Two (2) times a day.      Marland Kitchen PRESCRIPTION MEDICATION Inject into the vein every 21 ( twenty-one) days. Kyprolis and Cytoxan infusion every 3 weeks (Monday and Tuesday)      . Probiotic Product (ALIGN) 4 MG CAPS Take 1 capsule by mouth daily.      . prochlorperazine (COMPAZINE) 10 MG tablet 10 mg every 6 (six) hours as needed.       . sulfamethoxazole-trimethoprim (BACTRIM DS) 800-160 MG per tablet Take 1 tablet by mouth 2 (two) times daily. Take 1 tablet by mouth twice daily on Mondays and Thursdays.      . temazepam (RESTORIL) 15 MG capsule Take 15 mg by mouth. Take 1 capsule (15 mg total) by mouth nightly as needed for sleep.      . valACYclovir (VALTREX) 500 MG tablet Take 500 mg by mouth. Take 1 tablet (500 mg total) by mouth daily.      Marland Kitchen zolendronic acid (ZOMETA) 4 MG/5ML injection Inject 4 mg into the vein every 28 (twenty-eight) days.       No current facility-administered medications for this visit.   Facility-Administered Medications Ordered in Other Visits  Medication Dose Route Frequency Provider Last Rate Last Dose  . ondansetron (ZOFRAN) tablet 8 mg  8 mg Oral Once Si Gaul, MD        SURGICAL HISTORY:  Past Surgical History  Procedure Laterality Date  . Total knee replaced  2000    Left knee  . Appendectomy    . Colonoscopy  2009  . Esophagogastroduodenoscopy  2009    mild inflammation at esophagogastric junction  . Excision of melanoma      back ( x3)  . Great toe surgery      bilateral  .  Inguinal hernia repair      left, x 2  . Total knee arthroplasty  06/08/2011    Procedure: TOTAL KNEE ARTHROPLASTY;  Surgeon: Loreta Ave, MD;  Location: [facility] OR;  Service: Orthopedics;  Laterality: Right;  osteonics  .  Port placemet  10/2012    REVIEW OF SYSTEMS:  Constitutional: negative Eyes: negative Ears, nose, mouth, throat, and face: negative Respiratory: negative Cardiovascular: negative Gastrointestinal: negative Genitourinary:negative Integument/breast: negative Hematologic/lymphatic: negative Musculoskeletal:negative Neurological: negative Behavioral/Psych: negative Endocrine: negative Allergic/Immunologic: negative   PHYSICAL EXAMINATION: General appearance: alert, cooperative and no distress Head: Normocephalic, without obvious abnormality, atraumatic Neck: no adenopathy, no JVD, supple, symmetrical, trachea midline and thyroid not enlarged, symmetric, no tenderness/mass/nodules Lymph nodes: Cervical, supraclavicular, and axillary nodes normal. Resp: clear to auscultation bilaterally Back: symmetric, no curvature. ROM normal. No CVA tenderness. Cardio: regular rate and rhythm, S1, S2 normal, no murmur, click, rub or gallop GI: soft, non-tender; bowel sounds normal; no masses,  no organomegaly Extremities: extremities normal, atraumatic, no cyanosis or edema Neurologic: Alert and oriented X 3, normal strength and tone. Normal symmetric reflexes. Normal coordination and gait  ECOG PERFORMANCE STATUS: 1 - Symptomatic but completely ambulatory  Blood pressure 133/77, pulse 73, temperature 98 F (36.7 C), temperature source Oral, resp. rate 18, height 5' 9.58" (1.767 m), weight 210 lb 12.8 oz (95.618 kg).  LABORATORY DATA: Lab Results  Component Value Date   WBC 4.0 05/27/2013   HGB 12.7* 05/27/2013   HCT 38.4 05/27/2013   MCV 91.5 05/27/2013   PLT 171 05/27/2013      Chemistry      Component Value Date/Time   NA 141 05/27/2013 1045   NA 138 01/29/2013 0200    K 3.8 05/27/2013 1045   K 3.4* 01/29/2013 0200   CL 104 01/29/2013 0200   CL 109* 12/31/2012 0759   CO2 26 05/27/2013 1045   CO2 28 01/29/2013 0200   BUN 12.0 05/27/2013 1045   BUN 18 01/29/2013 0200   CREATININE 0.8 05/27/2013 1045   CREATININE 1.10 01/29/2013 0200   CREATININE 0.98 01/18/2012 0934      Component Value Date/Time   CALCIUM 9.6 05/27/2013 1045   CALCIUM 9.0 01/29/2013 0200   ALKPHOS 45 05/27/2013 1045   ALKPHOS 50 01/29/2013 0200   AST 23 05/27/2013 1045   AST 18 01/29/2013 0200   ALT 19 05/27/2013 1045   ALT 16 01/29/2013 0200   BILITOT 0.67 05/27/2013 1045   BILITOT 0.4 01/29/2013 0200       RADIOGRAPHIC STUDIES: No results found.  ASSESSMENT AND PLAN: This is a very pleasant 72 years old white male with history of multiple myeloma status post induction chemotherapy with Velcade, Revlimid and dexamethasone followed by treatment with Carfilzomib, cyclophosphamide and dexamethasone with significant improvement in his disease followed by autologous peripheral blood stem cell transplant at [facility] on 03/20/2013. I have a lengthy discussion with the patient today about his current condition and consolidation chemotherapy. I recommended for him to consider 2 cycles of consolidation chemotherapy with Carfilzomib, Cytoxan and dexamethasone. I discussed with the patient adverse effect of this treatment including but not limited to alopecia, myelosuppression, nausea and vomiting, peripheral neuropathy, liver or renal dysfunction as well as cardiac dysfunction.  The patient would like to proceed with treatment as planned. He is expected to start the first cycle of this treatment on 06/11/2013. He was advised to continue his prophylactic acyclovir prescribed by the transplant team. I would consider resuming his treatment with Zometa every 2 months. He would come back for followup visit in 3 weeks for reevaluation and management any adverse effect of his treatment. He was  advised to call immediately if he has any concerning symptoms in the interval. The patient voices understanding of  current disease status and treatment options and is in agreement with the current care plan.  All questions were answered. The patient knows to call the clinic with any problems, questions or concerns. We can certainly see the patient much sooner if necessary.  I spent 20 minutes counseling the patient face to face. The total time spent in the appointment was 30 minutes.

## 2013-06-03 NOTE — Telephone Encounter (Signed)
Per staff message and POF I have scheduled appts. Advised scheduler that lab appt needs to be moved  JMW

## 2013-06-03 NOTE — Telephone Encounter (Signed)
Gave pt appt for lab and MD, emailed Marcelino Duster regarding chemo for December and January 2015

## 2013-06-03 NOTE — Patient Instructions (Signed)
We discussed consolidation chemotherapy with Carfilzomib, Cytoxan and dexamethasone. First dose 06/11/2013. Followup visit in 3 weeks

## 2013-06-05 ENCOUNTER — Telehealth: Payer: Self-pay | Admitting: *Deleted

## 2013-06-05 NOTE — Telephone Encounter (Signed)
Pt called requesting a letter to be [facility].  His eye MD is requesting a letter for clearance to have cataracts surgery in December.  Letter written, signed by Dr Donnald Garre, faxed to Raynaldo Opitz at faxed to [phone].

## 2013-06-10 ENCOUNTER — Telehealth: Payer: Self-pay | Admitting: Medical Oncology

## 2013-06-10 ENCOUNTER — Other Ambulatory Visit: Payer: Medicare Other

## 2013-06-10 ENCOUNTER — Other Ambulatory Visit: Payer: Self-pay | Admitting: Medical Oncology

## 2013-06-10 DIAGNOSIS — C9000 Multiple myeloma not having achieved remission: Secondary | ICD-10-CM

## 2013-06-10 MED ORDER — DEXAMETHASONE 4 MG PO TABS: 40.0000 mg | ORAL_TABLET | ORAL | Status: DC

## 2013-06-10 MED ORDER — LIDOCAINE-PRILOCAINE 2.5-2.5 % EX CREA: TOPICAL_CREAM | CUTANEOUS | Status: DC | PRN

## 2013-06-10 NOTE — Telephone Encounter (Signed)
requests refill for decadron and lidocaine. Called to [facility]

## 2013-06-10 NOTE — Telephone Encounter (Signed)
Explained cycles for kyprolis and cytoxan and schedule for zometa.

## 2013-06-11 ENCOUNTER — Ambulatory Visit (HOSPITAL_BASED_OUTPATIENT_CLINIC_OR_DEPARTMENT_OTHER): Payer: Medicare Other

## 2013-06-11 ENCOUNTER — Other Ambulatory Visit (HOSPITAL_BASED_OUTPATIENT_CLINIC_OR_DEPARTMENT_OTHER): Payer: Medicare Other | Admitting: Lab

## 2013-06-11 VITALS — BP 130/81 | HR 89 | Temp 97.7°F | Resp 18

## 2013-06-11 DIAGNOSIS — Z5111 Encounter for antineoplastic chemotherapy: Secondary | ICD-10-CM

## 2013-06-11 DIAGNOSIS — C9000 Multiple myeloma not having achieved remission: Secondary | ICD-10-CM

## 2013-06-11 DIAGNOSIS — Z5112 Encounter for antineoplastic immunotherapy: Secondary | ICD-10-CM

## 2013-06-11 LAB — COMPREHENSIVE METABOLIC PANEL (CC13)
Alkaline Phosphatase: 49 U/L (ref 40–150)
Anion Gap: 11 mEq/L (ref 3–11)
BUN: 15.9 mg/dL (ref 7.0–26.0)
CO2: 21 mEq/L — ABNORMAL LOW (ref 22–29)
Calcium: 9.8 mg/dL (ref 8.4–10.4)
Chloride: 108 mEq/L (ref 98–109)
Creatinine: 0.9 mg/dL (ref 0.7–1.3)

## 2013-06-11 LAB — CBC WITH DIFFERENTIAL/PLATELET
BASO%: 0.3 % (ref 0.0–2.0)
Basophils Absolute: 0 10*3/uL (ref 0.0–0.1)
Eosinophils Absolute: 0 10*3/uL (ref 0.0–0.5)
HCT: 41.4 % (ref 38.4–49.9)
HGB: 13.8 g/dL (ref 13.0–17.1)
LYMPH%: 9.2 % — ABNORMAL LOW (ref 14.0–49.0)
MCHC: 33.3 g/dL (ref 32.0–36.0)
MCV: 93.3 fL (ref 79.3–98.0)
MONO#: 0.2 10*3/uL (ref 0.1–0.9)
MONO%: 2.1 % (ref 0.0–14.0)
NEUT%: 88.4 % — ABNORMAL HIGH (ref 39.0–75.0)
Platelets: 201 10*3/uL (ref 140–400)
RBC: 4.43 10*6/uL (ref 4.20–5.82)
WBC: 9.2 10*3/uL (ref 4.0–10.3)

## 2013-06-11 MED ORDER — DEXAMETHASONE SODIUM PHOSPHATE 10 MG/ML IJ SOLN
INTRAMUSCULAR | Status: AC
  Filled 2013-06-11: qty 1

## 2013-06-11 MED ORDER — SODIUM CHLORIDE 0.9 % IJ SOLN
10.0000 mL | INTRAMUSCULAR | Status: DC | PRN
  Administered 2013-06-11: 10 mL
  Filled 2013-06-11: qty 10

## 2013-06-11 MED ORDER — ZOLEDRONIC ACID 4 MG/100ML IV SOLN
4.0000 mg | Freq: Once | INTRAVENOUS | Status: AC
  Administered 2013-06-11: 4 mg via INTRAVENOUS
  Filled 2013-06-11: qty 100

## 2013-06-11 MED ORDER — SODIUM CHLORIDE 0.9 % IV SOLN
Freq: Once | INTRAVENOUS | Status: AC
  Administered 2013-06-11: 14:00:00 via INTRAVENOUS

## 2013-06-11 MED ORDER — SODIUM CHLORIDE 0.9 % IV SOLN
Freq: Once | INTRAVENOUS | Status: AC
  Administered 2013-06-11: 13:00:00 via INTRAVENOUS

## 2013-06-11 MED ORDER — HEPARIN SOD (PORK) LOCK FLUSH 100 UNIT/ML IV SOLN
500.0000 [IU] | Freq: Once | INTRAVENOUS | Status: AC | PRN
  Administered 2013-06-11: 500 [IU]
  Filled 2013-06-11: qty 5

## 2013-06-11 MED ORDER — SODIUM CHLORIDE 0.9 % IV SOLN
300.0000 mg/m2 | Freq: Once | INTRAVENOUS | Status: AC
  Administered 2013-06-11: 640 mg via INTRAVENOUS
  Filled 2013-06-11: qty 32

## 2013-06-11 MED ORDER — DEXAMETHASONE SODIUM PHOSPHATE 10 MG/ML IJ SOLN
10.0000 mg | Freq: Once | INTRAMUSCULAR | Status: AC
  Administered 2013-06-11: 10 mg via INTRAVENOUS

## 2013-06-11 MED ORDER — ONDANSETRON 8 MG/NS 50 ML IVPB
INTRAVENOUS | Status: AC
  Filled 2013-06-11: qty 8

## 2013-06-11 MED ORDER — DEXTROSE 5 % IV SOLN
36.0000 mg/m2 | Freq: Once | INTRAVENOUS | Status: AC
  Administered 2013-06-11: 76 mg via INTRAVENOUS
  Filled 2013-06-11: qty 38

## 2013-06-11 MED ORDER — ONDANSETRON 8 MG/50ML IVPB (CHCC)
8.0000 mg | Freq: Once | INTRAVENOUS | Status: AC
  Administered 2013-06-11: 8 mg via INTRAVENOUS

## 2013-06-11 NOTE — Patient Instructions (Signed)
[facility] Discharge Instructions for Patients Receiving Chemotherapy  Today you received the following chemotherapy agents Kryprolis, Zometa, Cytoxan  To help prevent nausea and vomiting after your treatment, we encourage you to take your nausea medication as directed.   If you develop nausea and vomiting that is not controlled by your nausea medication, call the clinic.   BELOW ARE SYMPTOMS THAT SHOULD BE REPORTED IMMEDIATELY:  *FEVER GREATER THAN 100.5 F  *CHILLS WITH OR WITHOUT FEVER  NAUSEA AND VOMITING THAT IS NOT CONTROLLED WITH YOUR NAUSEA MEDICATION  *UNUSUAL SHORTNESS OF BREATH  *UNUSUAL BRUISING OR BLEEDING  TENDERNESS IN MOUTH AND THROAT WITH OR WITHOUT PRESENCE OF ULCERS  *URINARY PROBLEMS  *BOWEL PROBLEMS  UNUSUAL RASH Items with * indicate a potential emergency and should be followed up as soon as possible.  Feel free to call the clinic you have any questions or concerns. The clinic phone number is [phone].

## 2013-06-12 ENCOUNTER — Ambulatory Visit (HOSPITAL_BASED_OUTPATIENT_CLINIC_OR_DEPARTMENT_OTHER): Payer: Medicare Other

## 2013-06-12 VITALS — BP 112/73 | HR 71 | Temp 98.2°F

## 2013-06-12 DIAGNOSIS — Z5112 Encounter for antineoplastic immunotherapy: Secondary | ICD-10-CM

## 2013-06-12 DIAGNOSIS — C9000 Multiple myeloma not having achieved remission: Secondary | ICD-10-CM

## 2013-06-12 MED ORDER — DEXAMETHASONE SODIUM PHOSPHATE 10 MG/ML IJ SOLN
INTRAMUSCULAR | Status: AC
  Filled 2013-06-12: qty 1

## 2013-06-12 MED ORDER — HEPARIN SOD (PORK) LOCK FLUSH 100 UNIT/ML IV SOLN
500.0000 [IU] | Freq: Once | INTRAVENOUS | Status: AC | PRN
  Administered 2013-06-12: 500 [IU]
  Filled 2013-06-12: qty 5

## 2013-06-12 MED ORDER — DEXTROSE 5 % IV SOLN
36.0000 mg/m2 | Freq: Once | INTRAVENOUS | Status: AC
  Administered 2013-06-12: 76 mg via INTRAVENOUS
  Filled 2013-06-12: qty 38

## 2013-06-12 MED ORDER — ONDANSETRON 8 MG/50ML IVPB (CHCC)
8.0000 mg | Freq: Once | INTRAVENOUS | Status: AC
  Administered 2013-06-12: 8 mg via INTRAVENOUS

## 2013-06-12 MED ORDER — SODIUM CHLORIDE 0.9 % IJ SOLN
10.0000 mL | INTRAMUSCULAR | Status: DC | PRN
  Administered 2013-06-12: 10 mL
  Filled 2013-06-12: qty 10

## 2013-06-12 MED ORDER — DEXAMETHASONE SODIUM PHOSPHATE 10 MG/ML IJ SOLN
10.0000 mg | Freq: Once | INTRAMUSCULAR | Status: AC
  Administered 2013-06-12: 10 mg via INTRAVENOUS

## 2013-06-12 MED ORDER — SODIUM CHLORIDE 0.9 % IV SOLN
Freq: Once | INTRAVENOUS | Status: AC
  Administered 2013-06-12: 08:00:00 via INTRAVENOUS

## 2013-06-12 MED ORDER — ONDANSETRON 8 MG/NS 50 ML IVPB
INTRAVENOUS | Status: AC
  Filled 2013-06-12: qty 8

## 2013-06-12 NOTE — Patient Instructions (Signed)
[facility] Discharge Instructions for Patients Receiving Chemotherapy  Today you received the following chemotherapy agents Kyprolis To help prevent nausea and vomiting after your treatment, we encourage you to take your nausea medication as prescribed.  If you develop nausea and vomiting that is not controlled by your nausea medication, call the clinic.   BELOW ARE SYMPTOMS THAT SHOULD BE REPORTED IMMEDIATELY:  *FEVER GREATER THAN 100.5 F  *CHILLS WITH OR WITHOUT FEVER  NAUSEA AND VOMITING THAT IS NOT CONTROLLED WITH YOUR NAUSEA MEDICATION  *UNUSUAL SHORTNESS OF BREATH  *UNUSUAL BRUISING OR BLEEDING  TENDERNESS IN MOUTH AND THROAT WITH OR WITHOUT PRESENCE OF ULCERS  *URINARY PROBLEMS  *BOWEL PROBLEMS  UNUSUAL RASH Items with * indicate a potential emergency and should be followed up as soon as possible.  Feel free to call the clinic you have any questions or concerns. The clinic phone number is [phone].    

## 2013-06-17 ENCOUNTER — Other Ambulatory Visit (HOSPITAL_BASED_OUTPATIENT_CLINIC_OR_DEPARTMENT_OTHER): Payer: Medicare Other | Admitting: Lab

## 2013-06-17 ENCOUNTER — Ambulatory Visit (HOSPITAL_BASED_OUTPATIENT_CLINIC_OR_DEPARTMENT_OTHER): Payer: Medicare Other

## 2013-06-17 VITALS — BP 144/82 | HR 78 | Temp 97.7°F | Resp 18

## 2013-06-17 DIAGNOSIS — C9000 Multiple myeloma not having achieved remission: Secondary | ICD-10-CM

## 2013-06-17 DIAGNOSIS — Z452 Encounter for adjustment and management of vascular access device: Secondary | ICD-10-CM

## 2013-06-17 DIAGNOSIS — Z5112 Encounter for antineoplastic immunotherapy: Secondary | ICD-10-CM

## 2013-06-17 DIAGNOSIS — Z5111 Encounter for antineoplastic chemotherapy: Secondary | ICD-10-CM

## 2013-06-17 LAB — CBC WITH DIFFERENTIAL/PLATELET
Eosinophils Absolute: 0.2 10*3/uL (ref 0.0–0.5)
HCT: 39.9 % (ref 38.4–49.9)
HGB: 13.2 g/dL (ref 13.0–17.1)
LYMPH%: 12.2 % — ABNORMAL LOW (ref 14.0–49.0)
MONO#: 0.3 10*3/uL (ref 0.1–0.9)
NEUT#: 3.6 10*3/uL (ref 1.5–6.5)
NEUT%: 76.6 % — ABNORMAL HIGH (ref 39.0–75.0)
Platelets: 140 10*3/uL (ref 140–400)
RBC: 4.27 10*6/uL (ref 4.20–5.82)
WBC: 4.7 10*3/uL (ref 4.0–10.3)

## 2013-06-17 LAB — COMPREHENSIVE METABOLIC PANEL (CC13)
Albumin: 3.7 g/dL (ref 3.5–5.0)
Anion Gap: 9 mEq/L (ref 3–11)
CO2: 26 mEq/L (ref 22–29)
Creatinine: 0.9 mg/dL (ref 0.7–1.3)
Glucose: 108 mg/dl (ref 70–140)
Potassium: 4 mEq/L (ref 3.5–5.1)
Sodium: 142 mEq/L (ref 136–145)
Total Bilirubin: 0.47 mg/dL (ref 0.20–1.20)
Total Protein: 6.3 g/dL — ABNORMAL LOW (ref 6.4–8.3)

## 2013-06-17 MED ORDER — DEXTROSE 5 % IV SOLN
36.0000 mg/m2 | Freq: Once | INTRAVENOUS | Status: AC
  Administered 2013-06-17: 76 mg via INTRAVENOUS
  Filled 2013-06-17: qty 38

## 2013-06-17 MED ORDER — DEXAMETHASONE SODIUM PHOSPHATE 10 MG/ML IJ SOLN
10.0000 mg | Freq: Once | INTRAMUSCULAR | Status: AC
  Administered 2013-06-17: 10 mg via INTRAVENOUS

## 2013-06-17 MED ORDER — ONDANSETRON 8 MG/50ML IVPB (CHCC)
8.0000 mg | Freq: Once | INTRAVENOUS | Status: AC
  Administered 2013-06-17: 8 mg via INTRAVENOUS

## 2013-06-17 MED ORDER — ALTEPLASE 2 MG IJ SOLR
2.0000 mg | Freq: Once | INTRAMUSCULAR | Status: AC | PRN
  Administered 2013-06-17: 2 mg
  Filled 2013-06-17: qty 2

## 2013-06-17 MED ORDER — SODIUM CHLORIDE 0.9 % IV SOLN
Freq: Once | INTRAVENOUS | Status: AC
  Administered 2013-06-17: 10:00:00 via INTRAVENOUS

## 2013-06-17 MED ORDER — ONDANSETRON 8 MG/NS 50 ML IVPB
INTRAVENOUS | Status: AC
  Filled 2013-06-17: qty 8

## 2013-06-17 MED ORDER — HEPARIN SOD (PORK) LOCK FLUSH 100 UNIT/ML IV SOLN
500.0000 [IU] | Freq: Once | INTRAVENOUS | Status: AC | PRN
  Administered 2013-06-17: 500 [IU]
  Filled 2013-06-17: qty 5

## 2013-06-17 MED ORDER — SODIUM CHLORIDE 0.9 % IV SOLN: Freq: Once | INTRAVENOUS | Status: DC

## 2013-06-17 MED ORDER — SODIUM CHLORIDE 0.9 % IV SOLN
300.0000 mg/m2 | Freq: Once | INTRAVENOUS | Status: AC
  Administered 2013-06-17: 640 mg via INTRAVENOUS
  Filled 2013-06-17: qty 32

## 2013-06-17 MED ORDER — SODIUM CHLORIDE 0.9 % IJ SOLN
10.0000 mL | INTRAMUSCULAR | Status: DC | PRN
  Administered 2013-06-17: 10 mL
  Filled 2013-06-17: qty 10

## 2013-06-17 MED ORDER — DEXAMETHASONE SODIUM PHOSPHATE 10 MG/ML IJ SOLN
INTRAMUSCULAR | Status: AC
  Filled 2013-06-17: qty 1

## 2013-06-17 NOTE — Patient Instructions (Signed)
[facility] Discharge Instructions for Patients Receiving Chemotherapy  Today you received the following chemotherapy agents Cytoxan and Kyprolis.  To help prevent nausea and vomiting after your treatment, we encourage you to take your nausea medication.   If you develop nausea and vomiting that is not controlled by your nausea medication, call the clinic.   BELOW ARE SYMPTOMS THAT SHOULD BE REPORTED IMMEDIATELY:  *FEVER GREATER THAN 100.5 F  *CHILLS WITH OR WITHOUT FEVER  NAUSEA AND VOMITING THAT IS NOT CONTROLLED WITH YOUR NAUSEA MEDICATION  *UNUSUAL SHORTNESS OF BREATH  *UNUSUAL BRUISING OR BLEEDING  TENDERNESS IN MOUTH AND THROAT WITH OR WITHOUT PRESENCE OF ULCERS  *URINARY PROBLEMS  *BOWEL PROBLEMS  UNUSUAL RASH Items with * indicate a potential emergency and should be followed up as soon as possible.  Feel free to call the clinic you have any questions or concerns. The clinic phone number is [phone].    

## 2013-06-18 ENCOUNTER — Ambulatory Visit (HOSPITAL_BASED_OUTPATIENT_CLINIC_OR_DEPARTMENT_OTHER): Payer: Medicare Other

## 2013-06-18 VITALS — BP 136/86 | HR 71 | Temp 97.7°F | Resp 20

## 2013-06-18 DIAGNOSIS — C9000 Multiple myeloma not having achieved remission: Secondary | ICD-10-CM

## 2013-06-18 DIAGNOSIS — Z5112 Encounter for antineoplastic immunotherapy: Secondary | ICD-10-CM

## 2013-06-18 MED ORDER — DEXAMETHASONE SODIUM PHOSPHATE 10 MG/ML IJ SOLN
10.0000 mg | Freq: Once | INTRAMUSCULAR | Status: AC
  Administered 2013-06-18: 10 mg via INTRAVENOUS

## 2013-06-18 MED ORDER — ONDANSETRON 8 MG/NS 50 ML IVPB
INTRAVENOUS | Status: AC
  Filled 2013-06-18: qty 8

## 2013-06-18 MED ORDER — ONDANSETRON 8 MG/50ML IVPB (CHCC)
8.0000 mg | Freq: Once | INTRAVENOUS | Status: AC
  Administered 2013-06-18: 8 mg via INTRAVENOUS

## 2013-06-18 MED ORDER — DEXTROSE 5 % IV SOLN
36.0000 mg/m2 | Freq: Once | INTRAVENOUS | Status: AC
  Administered 2013-06-18: 76 mg via INTRAVENOUS
  Filled 2013-06-18: qty 38

## 2013-06-18 MED ORDER — HEPARIN SOD (PORK) LOCK FLUSH 100 UNIT/ML IV SOLN
500.0000 [IU] | Freq: Once | INTRAVENOUS | Status: AC | PRN
  Administered 2013-06-18: 500 [IU]
  Filled 2013-06-18: qty 5

## 2013-06-18 MED ORDER — SODIUM CHLORIDE 0.9 % IV SOLN
Freq: Once | INTRAVENOUS | Status: AC
  Administered 2013-06-18: 09:00:00 via INTRAVENOUS

## 2013-06-18 MED ORDER — DEXAMETHASONE SODIUM PHOSPHATE 10 MG/ML IJ SOLN
INTRAMUSCULAR | Status: AC
  Filled 2013-06-18: qty 1

## 2013-06-18 MED ORDER — SODIUM CHLORIDE 0.9 % IJ SOLN
10.0000 mL | INTRAMUSCULAR | Status: DC | PRN
  Administered 2013-06-18: 10 mL
  Filled 2013-06-18: qty 10

## 2013-06-18 NOTE — Patient Instructions (Signed)
[facility] Discharge Instructions for Patients Receiving Chemotherapy  Today you received the following chemotherapy agents Kyprolis To help prevent nausea and vomiting after your treatment, we encourage you to take your nausea medication as prescribed.  If you develop nausea and vomiting that is not controlled by your nausea medication, call the clinic.   BELOW ARE SYMPTOMS THAT SHOULD BE REPORTED IMMEDIATELY:  *FEVER GREATER THAN 100.5 F  *CHILLS WITH OR WITHOUT FEVER  NAUSEA AND VOMITING THAT IS NOT CONTROLLED WITH YOUR NAUSEA MEDICATION  *UNUSUAL SHORTNESS OF BREATH  *UNUSUAL BRUISING OR BLEEDING  TENDERNESS IN MOUTH AND THROAT WITH OR WITHOUT PRESENCE OF ULCERS  *URINARY PROBLEMS  *BOWEL PROBLEMS  UNUSUAL RASH Items with * indicate a potential emergency and should be followed up as soon as possible.  Feel free to call the clinic you have any questions or concerns. The clinic phone number is [phone].    

## 2013-06-24 ENCOUNTER — Other Ambulatory Visit: Payer: Medicare Other

## 2013-06-27 ENCOUNTER — Telehealth: Payer: Self-pay | Admitting: *Deleted

## 2013-06-27 ENCOUNTER — Ambulatory Visit (HOSPITAL_BASED_OUTPATIENT_CLINIC_OR_DEPARTMENT_OTHER): Payer: Medicare Other | Admitting: Physician Assistant

## 2013-06-27 ENCOUNTER — Other Ambulatory Visit (HOSPITAL_BASED_OUTPATIENT_CLINIC_OR_DEPARTMENT_OTHER): Payer: Medicare Other

## 2013-06-27 ENCOUNTER — Telehealth: Payer: Self-pay | Admitting: Internal Medicine

## 2013-06-27 ENCOUNTER — Other Ambulatory Visit: Payer: Self-pay | Admitting: *Deleted

## 2013-06-27 ENCOUNTER — Ambulatory Visit (HOSPITAL_BASED_OUTPATIENT_CLINIC_OR_DEPARTMENT_OTHER): Payer: Medicare Other

## 2013-06-27 ENCOUNTER — Encounter: Payer: Self-pay | Admitting: Physician Assistant

## 2013-06-27 VITALS — BP 127/69 | HR 95 | Temp 97.4°F | Resp 18 | Ht 69.0 in | Wt 207.6 lb

## 2013-06-27 DIAGNOSIS — C9000 Multiple myeloma not having achieved remission: Secondary | ICD-10-CM

## 2013-06-27 DIAGNOSIS — Z5111 Encounter for antineoplastic chemotherapy: Secondary | ICD-10-CM

## 2013-06-27 DIAGNOSIS — Z5112 Encounter for antineoplastic immunotherapy: Secondary | ICD-10-CM

## 2013-06-27 LAB — COMPREHENSIVE METABOLIC PANEL (CC13)
ALT: 16 U/L (ref 0–55)
AST: 15 U/L (ref 5–34)
Albumin: 3.9 g/dL (ref 3.5–5.0)
Anion Gap: 8 mEq/L (ref 3–11)
CO2: 24 mEq/L (ref 22–29)
Calcium: 9.6 mg/dL (ref 8.4–10.4)
Chloride: 109 mEq/L (ref 98–109)
Creatinine: 1 mg/dL (ref 0.7–1.3)
Potassium: 3.7 mEq/L (ref 3.5–5.1)
Sodium: 142 mEq/L (ref 136–145)
Total Protein: 6.7 g/dL (ref 6.4–8.3)

## 2013-06-27 LAB — CBC WITH DIFFERENTIAL/PLATELET
Basophils Absolute: 0.1 10*3/uL (ref 0.0–0.1)
Eosinophils Absolute: 0.2 10*3/uL (ref 0.0–0.5)
HCT: 40.4 % (ref 38.4–49.9)
LYMPH%: 15.8 % (ref 14.0–49.0)
MONO#: 0.3 10*3/uL (ref 0.1–0.9)
NEUT#: 2.8 10*3/uL (ref 1.5–6.5)
Platelets: 210 10*3/uL (ref 140–400)
RBC: 4.31 10*6/uL (ref 4.20–5.82)
WBC: 4 10*3/uL (ref 4.0–10.3)
lymph#: 0.6 10*3/uL — ABNORMAL LOW (ref 0.9–3.3)

## 2013-06-27 LAB — LACTATE DEHYDROGENASE (CC13): LDH: 137 U/L (ref 125–245)

## 2013-06-27 MED ORDER — DEXTROSE 5 % IV SOLN
36.0000 mg/m2 | Freq: Once | INTRAVENOUS | Status: AC
  Administered 2013-06-27: 76 mg via INTRAVENOUS
  Filled 2013-06-27: qty 38

## 2013-06-27 MED ORDER — ONDANSETRON 8 MG/50ML IVPB (CHCC)
8.0000 mg | Freq: Once | INTRAVENOUS | Status: AC
  Administered 2013-06-27: 8 mg via INTRAVENOUS

## 2013-06-27 MED ORDER — SODIUM CHLORIDE 0.9 % IV SOLN
Freq: Once | INTRAVENOUS | Status: AC
  Administered 2013-06-27: 11:00:00 via INTRAVENOUS

## 2013-06-27 MED ORDER — ONDANSETRON 8 MG/NS 50 ML IVPB
INTRAVENOUS | Status: AC
  Filled 2013-06-27: qty 8

## 2013-06-27 MED ORDER — HEPARIN SOD (PORK) LOCK FLUSH 100 UNIT/ML IV SOLN
500.0000 [IU] | Freq: Once | INTRAVENOUS | Status: AC | PRN
  Administered 2013-06-27: 500 [IU]
  Filled 2013-06-27: qty 5

## 2013-06-27 MED ORDER — SODIUM CHLORIDE 0.9 % IV SOLN
300.0000 mg/m2 | Freq: Once | INTRAVENOUS | Status: AC
  Administered 2013-06-27: 640 mg via INTRAVENOUS
  Filled 2013-06-27: qty 32

## 2013-06-27 MED ORDER — DEXAMETHASONE SODIUM PHOSPHATE 10 MG/ML IJ SOLN
INTRAMUSCULAR | Status: AC
  Filled 2013-06-27: qty 1

## 2013-06-27 MED ORDER — DEXAMETHASONE SODIUM PHOSPHATE 10 MG/ML IJ SOLN
10.0000 mg | Freq: Once | INTRAMUSCULAR | Status: AC
  Administered 2013-06-27: 10 mg via INTRAVENOUS

## 2013-06-27 MED ORDER — SODIUM CHLORIDE 0.9 % IJ SOLN
10.0000 mL | INTRAMUSCULAR | Status: DC | PRN
  Administered 2013-06-27: 10 mL
  Filled 2013-06-27: qty 10

## 2013-06-27 NOTE — Progress Notes (Addendum)
[facility] [phone]   [phone]  SHARED VISIT PROGRESS NOTE  [name],[name], MD [address] [place] [address]  DIAGNOSIS: Stage IIA multiple myeloma diagnosed in July of 2013   PRIOR THERAPY:  1) Systemic chemotherapy with subcutaneous Velcade 1.3 mg/M2 on days 1, 4, 8 and 11 every 3 weeks in addition to Revlimid 25 mg by mouth daily for 14 days every 3 weeks and dexamethasone 40 mg on a weekly basis. Status post 4 planned cycles.  2) Systemic chemotherapy with subcutaneous Velcade 1.3 mg/M2 on days 1, 4, 8 and 11 every 3 weeks in addition to Revlimid 25 mg by mouth daily for 14 days every 3 weeks and dexamethasone 40 mg on a weekly basis. 4 more cycles are planned and he is status post 4 cycles of the second set of 4 cycles, now status post a total of 10 cycles. 3) Systemic chemotherapy with Carfilzomib 36 mg/M2 on days 1, 2, 8, 9, 15 and 16 in addition to cyclophosphamide 300 mg/M2 IV on days 1, 8 and 15 as well as dexamethasone on days 1, 8, 15 and 22 every 4 weeks. Status post 4 cycles. 4) Zometa 4 mg IV every month. 5) autologous peripheral blood stem cell transplant on 03/20/2013 at [facility]  CURRENT THERAPY: Consolidation chemotherapy with Carfilzomib 36 mg/M2 on days 1, 2, 8, 9, 15 and 16 every 4 weeks in addition to cyclophosphamide 300 mg/M2 on days 1, 8 and 15 as well as dexamethasone 40 mg on a weekly basis every 4 weeks. First cycle started on 06/11/2013.   INTERVAL HISTORY: [name] 72 y.o. male returns to the clinic today for followup visit.  The patient is feeling fine today with no specific complaints. He recently underwent autologous peripheral blood stem cell transplant at [facility] [place] on 03/20/2013. He was seen recently by Dr. Lance Bosch at [facility] and he recommended for him to proceed with 2 cycles of consolidation chemotherapy with Carfilzomib, dexamethasone and cyclophosphamide. He started his  first cycle of consolidation chemotherapy and thus far is tolerating it without difficulty. He reports that he is status post bilateral cataract surgery with significant improvement in his visual acuity. He has some difficulty sleeping related to the steroids and some occasional constipation that is well-controlled with Dulcolax. He voiced no other specific complaints. He denied having any significant chest pain, shortness of breath, cough or hemoptysis. The patient denied having any nausea or vomiting. Has no fever or chills. He is currently on prophylactic medication with acyclovir.  MEDICAL HISTORY: Past Medical History  Diagnosis Date  . GERD (gastroesophageal reflux disease)   . Adenomatous polyp of colon 1996  . Arthritis     Osteoarthritis right knee, spine, wrist  . Benign prostatic hypertrophy     (Dr. Vernie Ammons)  . History of chronic prostatitis   . History of melanoma     Back (Dr. Karlyn Agee); early melanoma R arm 03/2011  . Diverticulosis   . Arthritis of hand, right     Right thumb (Dr. Teressa Senter)  . Degenerative disc disease   . Hemorrhoids   . Erosive esophagitis   . Back ache     r/o Multiple Myeloma  . Multiple myeloma 2013    ALLERGIES:  is allergic to penicillins.  MEDICATIONS:  Current Outpatient Prescriptions  Medication Sig Dispense Refill  . acetaminophen (TYLENOL) 325 MG tablet Take 650 mg by mouth. prn      .  ascorbic acid (VITAMIN C) 500 MG tablet Take 500 mg by mouth daily.      Marland Kitchen aspirin EC 81 MG tablet Take 81 mg by mouth every other day.      . B Complex-C (B-COMPLEX WITH VITAMIN C) tablet Take 1 tablet by mouth daily.      . bacitracin ointment Apply topically. Apply as directed      . celecoxib (CELEBREX) 200 MG capsule Take 200 mg by mouth daily as needed for pain.      . cholecalciferol (VITAMIN D) 1000 UNITS tablet Take 1,000 Units by mouth daily.      Marland Kitchen dexamethasone (DECADRON) 4 MG tablet Take 10 tablets (40 mg total) by mouth once a week. Takes on  Monday am.  120 tablet  0  . fish oil-omega-3 fatty acids 1000 MG capsule Take 1 g by mouth daily.       Marland Kitchen HYDROcodone-acetaminophen (NORCO) 7.5-325 MG per tablet Take 1 tablet by mouth every 6 (six) hours as needed.  30 tablet  0  . lidocaine-prilocaine (EMLA) cream Apply topically as needed. Apply to the port 1 hour before chemo  30 g  0  . Multiple Vitamin (MULTIVITAMIN WITH MINERALS) TABS Take 1 tablet by mouth daily.      . ondansetron (ZOFRAN-ODT) 4 MG disintegrating tablet Take 4 mg by mouth every 12 (twelve) hours as needed for nausea.      . pantoprazole (PROTONIX) 40 MG tablet Take 40 mg by mouth. Take 1 tablet (40 mg total) by mouth Two (2) times a day.      Marland Kitchen PRESCRIPTION MEDICATION Inject into the vein every 21 ( twenty-one) days. Kyprolis and Cytoxan infusion every 3 weeks (Monday and Tuesday)      . Probiotic Product (ALIGN) 4 MG CAPS Take 1 capsule by mouth daily.      . valACYclovir (VALTREX) 500 MG tablet Take 500 mg by mouth. Take 1 tablet (500 mg total) by mouth daily.      Marland Kitchen zolendronic acid (ZOMETA) 4 MG/5ML injection Inject 4 mg into the vein every 28 (twenty-eight) days.      Marland Kitchen BESIVANCE 0.6 % SUSP       . clonazePAM (KLONOPIN) 0.5 MG tablet Take 0.5 mg by mouth. Take 1 tablet (0.5 mg total) by mouth Two (2) times a day.      . DUREZOL 0.05 % EMUL       . oxycodone (OXY-IR) 5 MG capsule Take 5 mg by mouth every 6 (six) hours as needed for pain.      Marland Kitchen prochlorperazine (COMPAZINE) 10 MG tablet 10 mg every 6 (six) hours as needed.       Marland Kitchen PROLENSA 0.07 % SOLN       . temazepam (RESTORIL) 15 MG capsule Take 15 mg by mouth. Take 1 capsule (15 mg total) by mouth nightly as needed for sleep.       No current facility-administered medications for this visit.   Facility-Administered Medications Ordered in Other Visits  Medication Dose Route Frequency Provider Last Rate Last Dose  . ondansetron (ZOFRAN) tablet 8 mg  8 mg Oral Once Si Gaul, MD      . sodium chloride 0.9  % injection 10 mL  10 mL Intracatheter PRN Si Gaul, MD   10 mL at 06/11/13 1616  . sodium chloride 0.9 % injection 10 mL  10 mL Intracatheter PRN Si Gaul, MD   10 mL at 06/27/13 1301    SURGICAL HISTORY:  Past Surgical History  Procedure Laterality Date  . Total knee replaced  2000    Left knee  . Appendectomy    . Colonoscopy  2009  . Esophagogastroduodenoscopy  2009    mild inflammation at esophagogastric junction  . Excision of melanoma      back ( x3)  . Great toe surgery      bilateral  . Inguinal hernia repair      left, x 2  . Total knee arthroplasty  06/08/2011    Procedure: TOTAL KNEE ARTHROPLASTY;  Surgeon: Loreta Ave, MD;  Location: [facility] OR;  Service: Orthopedics;  Laterality: Right;  osteonics  . [place]  10/2012    REVIEW OF SYSTEMS:  Constitutional: negative Eyes: negative Ears, nose, mouth, throat, and face: negative Respiratory: negative Cardiovascular: negative Gastrointestinal: negative Genitourinary:negative Integument/breast: negative Hematologic/lymphatic: negative Musculoskeletal:negative Neurological: negative Behavioral/Psych: negative Endocrine: negative Allergic/Immunologic: negative   PHYSICAL EXAMINATION: General appearance: alert, cooperative and no distress Head: Normocephalic, without obvious abnormality, atraumatic Neck: no adenopathy, no JVD, supple, symmetrical, trachea midline and thyroid not enlarged, symmetric, no tenderness/mass/nodules Lymph nodes: Cervical, supraclavicular, and axillary nodes normal. Resp: clear to auscultation bilaterally Back: symmetric, no curvature. ROM normal. No CVA tenderness. Cardio: regular rate and rhythm, S1, S2 normal, no murmur, click, rub or gallop GI: soft, non-tender; bowel sounds normal; no masses,  no organomegaly Extremities: extremities normal, atraumatic, no cyanosis or edema Neurologic: Alert and oriented X 3, normal strength and tone. Normal symmetric reflexes.  Normal coordination and gait  ECOG PERFORMANCE STATUS: 1 - Symptomatic but completely ambulatory  Blood pressure 127/69, pulse 95, temperature 97.4 F (36.3 C), temperature source Oral, resp. rate 18, height 5\' 9"  (1.753 m), weight 207 lb 9.6 oz (94.167 kg).  LABORATORY DATA: Lab Results  Component Value Date   WBC 4.0 06/27/2013   HGB 13.6 06/27/2013   HCT 40.4 06/27/2013   MCV 93.6 06/27/2013   PLT 210 06/27/2013      Chemistry      Component Value Date/Time   NA 142 06/27/2013 0836   NA 138 01/29/2013 0200   K 3.7 06/27/2013 0836   K 3.4* 01/29/2013 0200   CL 104 01/29/2013 0200   CL 109* 12/31/2012 0759   CO2 24 06/27/2013 0836   CO2 28 01/29/2013 0200   BUN 20.0 06/27/2013 0836   BUN 18 01/29/2013 0200   CREATININE 1.0 06/27/2013 0836   CREATININE 1.10 01/29/2013 0200   CREATININE 0.98 01/18/2012 0934      Component Value Date/Time   CALCIUM 9.6 06/27/2013 0836   CALCIUM 9.0 01/29/2013 0200   ALKPHOS 52 06/27/2013 0836   ALKPHOS 50 01/29/2013 0200   AST 15 06/27/2013 0836   AST 18 01/29/2013 0200   ALT 16 06/27/2013 0836   ALT 16 01/29/2013 0200   BILITOT 0.59 06/27/2013 0836   BILITOT 0.4 01/29/2013 0200       RADIOGRAPHIC STUDIES: No results found.  ASSESSMENT AND PLAN: This is a very pleasant 72 years old white male with history of multiple myeloma status post induction chemotherapy with Velcade, Revlimid and dexamethasone followed by treatment with Carfilzomib, cyclophosphamide and dexamethasone with significant improvement in his disease followed by autologous peripheral blood stem cell transplant at [facility] on 03/20/2013. Patient is currently undergoing consolidation chemotherapy with Carfilzomib, Cytoxan and dexamethasone. Patient was discussed with also seen by Dr. Arbutus Ped. He will proceed with cycle 1 of his consolidation chemotherapy as scheduled and followup with Dr. Arbutus Ped 07/15/2013 to  begin cycle #2 of his consolidation chemotherapy.He was advised to  continue his prophylactic acyclovir prescribed by the transplant team. We will consider resuming his treatment with Zometa every 2 months. He was advised to call immediately if he has any concerning symptoms in the interval. The patient voices understanding of current disease status and treatment options and is in agreement with the current care plan.  All questions were answered. The patient knows to call the clinic with any problems, questions or concerns. We can certainly see the patient much sooner if necessary.  Conni Slipper PA-C  ADDENDUM: Hematology/Oncology Attending: I had the face to face encounter with the patient. I recommended for his care plan. The patient is a very pleasant 72 years old white male with history of multiple myeloma status post induction chemotherapy followed by autologous peripheral blood stem cell transplant and he is currently undergoing 2 cycles of consolidation chemotherapy with Carfilzomib, Cytoxan and Decadron. He is status post 1 cycle and he is here today to start cycle #2. He tolerated the first cycle of his treatment fairly well with no significant adverse effects. We will proceed with cycle #2 today as scheduled. The patient would also continue on Zometa 4 mg IV every 2 months. He would come back for follow up visit in 3 weeks for evaluation and management any adverse effect of his treatment. He was advised to call immediately if he has any concerning symptoms in the interval.   Lajuana Matte., MD 07/01/2013

## 2013-06-27 NOTE — Patient Instructions (Signed)
[facility] Discharge Instructions for Patients Receiving Chemotherapy  Today you received the following chemotherapy agents Kyprolis/Cytoxan.  To help prevent nausea and vomiting after your treatment, we encourage you to take your nausea medication as prescribed.   If you develop nausea and vomiting that is not controlled by your nausea medication, call the clinic.   BELOW ARE SYMPTOMS THAT SHOULD BE REPORTED IMMEDIATELY:  *FEVER GREATER THAN 100.5 F  *CHILLS WITH OR WITHOUT FEVER  NAUSEA AND VOMITING THAT IS NOT CONTROLLED WITH YOUR NAUSEA MEDICATION  *UNUSUAL SHORTNESS OF BREATH  *UNUSUAL BRUISING OR BLEEDING  TENDERNESS IN MOUTH AND THROAT WITH OR WITHOUT PRESENCE OF ULCERS  *URINARY PROBLEMS  *BOWEL PROBLEMS  UNUSUAL RASH Items with * indicate a potential emergency and should be followed up as soon as possible.  Feel free to call the clinic you have any questions or concerns. The clinic phone number is [phone].    

## 2013-06-27 NOTE — Telephone Encounter (Signed)
Per staff message and POF I have scheduled appts.  JMW  

## 2013-06-27 NOTE — Telephone Encounter (Signed)
Gave pt appt for lab and MD for January 2015 °

## 2013-06-28 ENCOUNTER — Ambulatory Visit (HOSPITAL_BASED_OUTPATIENT_CLINIC_OR_DEPARTMENT_OTHER): Payer: Medicare Other

## 2013-06-28 VITALS — BP 133/68 | HR 89 | Temp 97.6°F

## 2013-06-28 DIAGNOSIS — Z5112 Encounter for antineoplastic immunotherapy: Secondary | ICD-10-CM

## 2013-06-28 DIAGNOSIS — C9000 Multiple myeloma not having achieved remission: Secondary | ICD-10-CM

## 2013-06-28 MED ORDER — DEXTROSE 5 % IV SOLN
36.0000 mg/m2 | Freq: Once | INTRAVENOUS | Status: AC
  Administered 2013-06-28: 76 mg via INTRAVENOUS
  Filled 2013-06-28: qty 38

## 2013-06-28 MED ORDER — ONDANSETRON 8 MG/50ML IVPB (CHCC)
8.0000 mg | Freq: Once | INTRAVENOUS | Status: AC
  Administered 2013-06-28: 8 mg via INTRAVENOUS

## 2013-06-28 MED ORDER — DEXAMETHASONE SODIUM PHOSPHATE 10 MG/ML IJ SOLN
INTRAMUSCULAR | Status: AC
  Filled 2013-06-28: qty 1

## 2013-06-28 MED ORDER — DEXAMETHASONE SODIUM PHOSPHATE 10 MG/ML IJ SOLN
10.0000 mg | Freq: Once | INTRAMUSCULAR | Status: AC
  Administered 2013-06-28: 10 mg via INTRAVENOUS

## 2013-06-28 MED ORDER — HEPARIN SOD (PORK) LOCK FLUSH 100 UNIT/ML IV SOLN
500.0000 [IU] | Freq: Once | INTRAVENOUS | Status: AC | PRN
  Administered 2013-06-28: 500 [IU]
  Filled 2013-06-28: qty 5

## 2013-06-28 MED ORDER — SODIUM CHLORIDE 0.9 % IV SOLN
Freq: Once | INTRAVENOUS | Status: AC
  Administered 2013-06-28: 09:00:00 via INTRAVENOUS

## 2013-06-28 MED ORDER — SODIUM CHLORIDE 0.9 % IJ SOLN
10.0000 mL | INTRAMUSCULAR | Status: DC | PRN
  Administered 2013-06-28: 10 mL
  Filled 2013-06-28: qty 10

## 2013-06-28 MED ORDER — ONDANSETRON 8 MG/NS 50 ML IVPB
INTRAVENOUS | Status: AC
  Filled 2013-06-28: qty 8

## 2013-06-28 NOTE — Patient Instructions (Signed)
[facility] Discharge Instructions for Patients Receiving Chemotherapy  Today you received the following chemotherapy agents Kyprolis To help prevent nausea and vomiting after your treatment, we encourage you to take your nausea medication as prescribed.  If you develop nausea and vomiting that is not controlled by your nausea medication, call the clinic.   BELOW ARE SYMPTOMS THAT SHOULD BE REPORTED IMMEDIATELY:  *FEVER GREATER THAN 100.5 F  *CHILLS WITH OR WITHOUT FEVER  NAUSEA AND VOMITING THAT IS NOT CONTROLLED WITH YOUR NAUSEA MEDICATION  *UNUSUAL SHORTNESS OF BREATH  *UNUSUAL BRUISING OR BLEEDING  TENDERNESS IN MOUTH AND THROAT WITH OR WITHOUT PRESENCE OF ULCERS  *URINARY PROBLEMS  *BOWEL PROBLEMS  UNUSUAL RASH Items with * indicate a potential emergency and should be followed up as soon as possible.  Feel free to call the clinic you have any questions or concerns. The clinic phone number is [phone].    

## 2013-06-30 NOTE — Patient Instructions (Signed)
Continue lab and chemotherapy as scheduled Follow up in 3 weeks 

## 2013-07-01 ENCOUNTER — Other Ambulatory Visit: Payer: Medicare Other

## 2013-07-06 ENCOUNTER — Encounter: Payer: Self-pay | Admitting: Family Medicine

## 2013-07-08 ENCOUNTER — Ambulatory Visit: Payer: Medicare Other | Admitting: Internal Medicine

## 2013-07-08 ENCOUNTER — Other Ambulatory Visit: Payer: Medicare Other

## 2013-07-09 ENCOUNTER — Telehealth: Payer: Self-pay | Admitting: *Deleted

## 2013-07-09 NOTE — Telephone Encounter (Signed)
Pt called left a msg stating he has found a scrotal mass and wanted to see if Dr Donnald Garre would order a PSA when he gets labs done on 07/15/13.  Per Dr Donnald Garre, pt does not need PSA but does need to f/u with his urologist to have the mass evaluated.  Called pt back and left vm on cell phone.  SLJ

## 2013-07-10 ENCOUNTER — Ambulatory Visit: Payer: Medicare Other

## 2013-07-12 ENCOUNTER — Ambulatory Visit: Payer: Medicare Other

## 2013-07-15 ENCOUNTER — Encounter: Payer: Self-pay | Admitting: Internal Medicine

## 2013-07-15 ENCOUNTER — Other Ambulatory Visit (HOSPITAL_BASED_OUTPATIENT_CLINIC_OR_DEPARTMENT_OTHER): Payer: Medicare Other

## 2013-07-15 ENCOUNTER — Ambulatory Visit (HOSPITAL_BASED_OUTPATIENT_CLINIC_OR_DEPARTMENT_OTHER): Payer: Medicare Other | Admitting: Internal Medicine

## 2013-07-15 ENCOUNTER — Other Ambulatory Visit: Payer: Medicare Other

## 2013-07-15 ENCOUNTER — Ambulatory Visit (HOSPITAL_BASED_OUTPATIENT_CLINIC_OR_DEPARTMENT_OTHER): Payer: Medicare Other

## 2013-07-15 ENCOUNTER — Telehealth: Payer: Self-pay | Admitting: Internal Medicine

## 2013-07-15 VITALS — BP 118/79 | HR 93 | Temp 97.5°F | Resp 18 | Ht 69.0 in | Wt 210.0 lb

## 2013-07-15 DIAGNOSIS — Z452 Encounter for adjustment and management of vascular access device: Secondary | ICD-10-CM

## 2013-07-15 DIAGNOSIS — C9000 Multiple myeloma not having achieved remission: Secondary | ICD-10-CM

## 2013-07-15 DIAGNOSIS — Z5112 Encounter for antineoplastic immunotherapy: Secondary | ICD-10-CM

## 2013-07-15 DIAGNOSIS — N508 Other specified disorders of male genital organs: Secondary | ICD-10-CM

## 2013-07-15 DIAGNOSIS — Z5111 Encounter for antineoplastic chemotherapy: Secondary | ICD-10-CM

## 2013-07-15 LAB — CBC WITH DIFFERENTIAL/PLATELET
BASO%: 0.3 % (ref 0.0–2.0)
BASOS ABS: 0 10*3/uL (ref 0.0–0.1)
EOS ABS: 0 10*3/uL (ref 0.0–0.5)
EOS%: 0.1 % (ref 0.0–7.0)
HCT: 38.4 % (ref 38.4–49.9)
HEMOGLOBIN: 13 g/dL (ref 13.0–17.1)
LYMPH#: 0.2 10*3/uL — AB (ref 0.9–3.3)
LYMPH%: 3 % — ABNORMAL LOW (ref 14.0–49.0)
MCH: 32.8 pg (ref 27.2–33.4)
MCHC: 34 g/dL (ref 32.0–36.0)
MCV: 96.3 fL (ref 79.3–98.0)
MONO#: 0 10*3/uL — AB (ref 0.1–0.9)
MONO%: 0.6 % (ref 0.0–14.0)
NEUT%: 96 % — ABNORMAL HIGH (ref 39.0–75.0)
NEUTROS ABS: 6.4 10*3/uL (ref 1.5–6.5)
Platelets: 195 10*3/uL (ref 140–400)
RBC: 3.98 10*6/uL — ABNORMAL LOW (ref 4.20–5.82)
RDW: 15.7 % — AB (ref 11.0–14.6)
WBC: 6.6 10*3/uL (ref 4.0–10.3)

## 2013-07-15 LAB — COMPREHENSIVE METABOLIC PANEL (CC13)
ALBUMIN: 4.1 g/dL (ref 3.5–5.0)
ALT: 17 U/L (ref 0–55)
ANION GAP: 12 meq/L — AB (ref 3–11)
AST: 16 U/L (ref 5–34)
Alkaline Phosphatase: 49 U/L (ref 40–150)
BUN: 12.9 mg/dL (ref 7.0–26.0)
CALCIUM: 9.5 mg/dL (ref 8.4–10.4)
CHLORIDE: 107 meq/L (ref 98–109)
CO2: 20 meq/L — AB (ref 22–29)
CREATININE: 1 mg/dL (ref 0.7–1.3)
GLUCOSE: 141 mg/dL — AB (ref 70–140)
POTASSIUM: 4 meq/L (ref 3.5–5.1)
Sodium: 140 mEq/L (ref 136–145)
Total Bilirubin: 0.71 mg/dL (ref 0.20–1.20)
Total Protein: 6.8 g/dL (ref 6.4–8.3)

## 2013-07-15 LAB — LACTATE DEHYDROGENASE (CC13): LDH: 151 U/L (ref 125–245)

## 2013-07-15 MED ORDER — ONDANSETRON 8 MG/NS 50 ML IVPB
INTRAVENOUS | Status: AC
  Filled 2013-07-15: qty 8

## 2013-07-15 MED ORDER — HYDROCODONE-ACETAMINOPHEN 7.5-325 MG PO TABS: 1.0000 | ORAL_TABLET | Freq: Four times a day (QID) | ORAL | Status: DC | PRN

## 2013-07-15 MED ORDER — DEXAMETHASONE SODIUM PHOSPHATE 10 MG/ML IJ SOLN
10.0000 mg | Freq: Once | INTRAMUSCULAR | Status: AC
  Administered 2013-07-15: 10 mg via INTRAVENOUS

## 2013-07-15 MED ORDER — ONDANSETRON 8 MG/50ML IVPB (CHCC)
8.0000 mg | Freq: Once | INTRAVENOUS | Status: AC
  Administered 2013-07-15: 8 mg via INTRAVENOUS

## 2013-07-15 MED ORDER — TEMAZEPAM 15 MG PO CAPS: 15.0000 mg | ORAL_CAPSULE | Freq: Every day | ORAL | Status: DC

## 2013-07-15 MED ORDER — SODIUM CHLORIDE 0.9 % IV SOLN
Freq: Once | INTRAVENOUS | Status: AC
  Administered 2013-07-15: 16:00:00 via INTRAVENOUS

## 2013-07-15 MED ORDER — ALTEPLASE 2 MG IJ SOLR
2.0000 mg | Freq: Once | INTRAMUSCULAR | Status: AC | PRN
  Administered 2013-07-15: 2 mg
  Filled 2013-07-15: qty 2

## 2013-07-15 MED ORDER — SODIUM CHLORIDE 0.9 % IJ SOLN
10.0000 mL | INTRAMUSCULAR | Status: DC | PRN
  Administered 2013-07-15: 10 mL
  Filled 2013-07-15: qty 10

## 2013-07-15 MED ORDER — HEPARIN SOD (PORK) LOCK FLUSH 100 UNIT/ML IV SOLN
500.0000 [IU] | Freq: Once | INTRAVENOUS | Status: AC | PRN
  Administered 2013-07-15: 500 [IU]
  Filled 2013-07-15: qty 5

## 2013-07-15 MED ORDER — CYCLOPHOSPHAMIDE CHEMO INJECTION 1 GM
300.0000 mg/m2 | Freq: Once | INTRAMUSCULAR | Status: AC
  Administered 2013-07-15: 640 mg via INTRAVENOUS
  Filled 2013-07-15: qty 32

## 2013-07-15 MED ORDER — SODIUM CHLORIDE 0.9 % IV SOLN: Freq: Once | INTRAVENOUS | Status: DC

## 2013-07-15 MED ORDER — DEXAMETHASONE SODIUM PHOSPHATE 10 MG/ML IJ SOLN
INTRAMUSCULAR | Status: AC
  Filled 2013-07-15: qty 1

## 2013-07-15 MED ORDER — DEXTROSE 5 % IV SOLN
36.0000 mg/m2 | Freq: Once | INTRAVENOUS | Status: AC
  Administered 2013-07-15: 76 mg via INTRAVENOUS
  Filled 2013-07-15: qty 38

## 2013-07-15 NOTE — Progress Notes (Signed)
[place] [phone]   [phone]  OFFICE PROGRESS NOTE  [name],[name], MD [place] [address]  DIAGNOSIS: Stage IIA multiple myeloma diagnosed in July of 2013   PRIOR THERAPY:  1) Systemic chemotherapy with subcutaneous Velcade 1.3 mg/M2 on days 1, 4, 8 and 11 every 3 weeks in addition to Revlimid 25 mg by mouth daily for 14 days every 3 weeks and dexamethasone 40 mg on a weekly basis. Status post 4 planned cycles.  2) Systemic chemotherapy with subcutaneous Velcade 1.3 mg/M2 on days 1, 4, 8 and 11 every 3 weeks in addition to Revlimid 25 mg by mouth daily for 14 days every 3 weeks and dexamethasone 40 mg on a weekly basis. 4 more cycles are planned and he is status post 4 cycles of the second set of 4 cycles, now status post a total of 10 cycles. 3) Systemic chemotherapy with Carfilzomib 36 mg/M2 on days 1, 2, 8, 9, 15 and 16 in addition to cyclophosphamide 300 mg/M2 IV on days 1, 8 and 15 as well as dexamethasone on days 1, 8, 15 and 22 every 4 weeks. Status post 4 cycles. 4) Zometa 4 mg IV every month. 5) autologous peripheral blood stem cell transplant on 03/20/2013 at [facility]:  1) Consolidation chemotherapy with Carfilzomib 36 mg/M2 on days 1, 2, 8, 9, 15 and 16 every 4 weeks in addition to cyclophosphamide 300 mg/M2 on days 1, 8 and 15 as well as dexamethasone 40 mg on a weekly basis every 4 weeks. Status post 1 cycle. First cycle 06/11/2013. 2) Zometa 4 mg IV every 2 months.   INTERVAL HISTORY: [name] 73 y.o. male returns to the clinic today for followup visit accompanied by his wife. The patient is feeling fine today with no specific complaints. He recently underwent autologous peripheral blood stem cell transplant at [facility] on 03/20/2013. He was seen recently by Dr. Ok Edwards at [facility] and he recommended for him to proceed with 2 cycles of consolidation chemotherapy  with Carfilzomib, dexamethasone and cyclophosphamide. He is status post 1 cycle and tolerating it fairly well. His recent myeloma panel at [facility] showed significant improvement in his disease. He recently noted a lump in the scrotum and he is scheduled to see Dr. Karsten Ro tomorrow for evaluation. He denied having any significant chest pain, shortness of breath, cough or hemoptysis. The patient denied having any nausea or vomiting. Has no fever or chills.    MEDICAL HISTORY: Past Medical History  Diagnosis Date  . GERD (gastroesophageal reflux disease)   . Adenomatous polyp of colon 1996  . Arthritis     Osteoarthritis right knee, spine, wrist  . Benign prostatic hypertrophy     (Dr. Karsten Ro)  . History of chronic prostatitis   . History of melanoma     Back (Dr. Wilhemina Bonito); early melanoma R arm 03/2011  . Diverticulosis   . Arthritis of hand, right     Right thumb (Dr. Daylene Katayama)  . Degenerative disc disease   . Hemorrhoids   . Erosive esophagitis   . Back ache     r/o Multiple Myeloma  . Multiple myeloma 2013    ALLERGIES:  is allergic to penicillins.  MEDICATIONS:  Current Outpatient Prescriptions  Medication Sig Dispense Refill  . acetaminophen (TYLENOL) 325 MG tablet Take 650 mg by mouth. prn      . ascorbic acid (  VITAMIN C) 500 MG tablet Take 500 mg by mouth daily.      Marland Kitchen aspirin EC 81 MG tablet Take 81 mg by mouth every other day.      . B Complex-C (B-COMPLEX WITH VITAMIN C) tablet Take 1 tablet by mouth daily.      . bacitracin ointment Apply topically. Apply as directed      . BESIVANCE 0.6 % SUSP       . cholecalciferol (VITAMIN D) 1000 UNITS tablet Take 1,000 Units by mouth daily.      Marland Kitchen dexamethasone (DECADRON) 4 MG tablet Take 10 tablets (40 mg total) by mouth once a week. Takes on Monday am.  120 tablet  0  . DUREZOL 0.05 % EMUL       . fish oil-omega-3 fatty acids 1000 MG capsule Take 1 g by mouth daily.       Marland Kitchen HYDROcodone-acetaminophen (NORCO) 7.5-325 MG  per tablet Take 1 tablet by mouth every 6 (six) hours as needed.  30 tablet  0  . lidocaine-prilocaine (EMLA) cream Apply topically as needed. Apply to the port 1 hour before chemo  30 g  0  . Multiple Vitamin (MULTIVITAMIN WITH MINERALS) TABS Take 1 tablet by mouth daily.      . pantoprazole (PROTONIX) 40 MG tablet Take 40 mg by mouth. Take 1 tablet (40 mg total) by mouth Two (2) times a day.      Marland Kitchen PRESCRIPTION MEDICATION Inject into the vein every 21 ( twenty-one) days. Kyprolis and Cytoxan infusion every 3 weeks (Monday and Tuesday)      . Probiotic Product (ALIGN) 4 MG CAPS Take 1 capsule by mouth daily.      Marland Kitchen PROLENSA 0.07 % SOLN       . valACYclovir (VALTREX) 500 MG tablet Take 500 mg by mouth. Take 1 tablet (500 mg total) by mouth daily.      Marland Kitchen zolendronic acid (ZOMETA) 4 MG/5ML injection Inject 4 mg into the vein every 28 (twenty-eight) days.      . celecoxib (CELEBREX) 200 MG capsule Take 200 mg by mouth daily as needed for pain.      . clonazePAM (KLONOPIN) 0.5 MG tablet Take 0.5 mg by mouth. Take 1 tablet (0.5 mg total) by mouth Two (2) times a day.      . ondansetron (ZOFRAN-ODT) 4 MG disintegrating tablet Take 4 mg by mouth every 12 (twelve) hours as needed for nausea.      Marland Kitchen oxycodone (OXY-IR) 5 MG capsule Take 5 mg by mouth every 6 (six) hours as needed for pain.      Marland Kitchen prochlorperazine (COMPAZINE) 10 MG tablet 10 mg every 6 (six) hours as needed.       . temazepam (RESTORIL) 15 MG capsule Take 1 capsule (15 mg total) by mouth at bedtime. Take 1 capsule (15 mg total) by mouth nightly as needed for sleep.  30 capsule  0   No current facility-administered medications for this visit.   Facility-Administered Medications Ordered in Other Visits  Medication Dose Route Frequency Provider Last Rate Last Dose  . 0.9 %  sodium chloride infusion   Intravenous Once Curt Bears, MD      . carfilzomib (KYPROLIS) 76 mg in dextrose 5 % 50 mL chemo infusion  36 mg/m2 (Treatment Plan Actual)  Intravenous Once Curt Bears, MD      . cyclophosphamide (CYTOXAN) 640 mg in sodium chloride 0.9 % 250 mL chemo infusion  300 mg/m2 (Treatment Plan  Actual) Intravenous Once Curt Bears, MD      . heparin lock flush 100 unit/mL  500 Units Intracatheter Once PRN Curt Bears, MD      . ondansetron [facility]) tablet 8 mg  8 mg Oral Once Curt Bears, MD      . sodium chloride 0.9 % injection 10 mL  10 mL Intracatheter PRN Curt Bears, MD   10 mL at 06/11/13 1616  . sodium chloride 0.9 % injection 10 mL  10 mL Intracatheter PRN Curt Bears, MD        SURGICAL HISTORY:  Past Surgical History  Procedure Laterality Date  . Total knee replaced  2000    Left knee  . Appendectomy    . Colonoscopy  2009  . Esophagogastroduodenoscopy  2009    mild inflammation at esophagogastric junction  . Excision of melanoma      back ( x3)  . Great toe surgery      bilateral  . Inguinal hernia repair      left, x 2  . Total knee arthroplasty  06/08/2011    Procedure: TOTAL KNEE ARTHROPLASTY;  Surgeon: Ninetta Lights, MD;  Location: [place];  Service: Orthopedics;  Laterality: Right;  osteonics  . [place]  10/2012    REVIEW OF SYSTEMS:  Constitutional: negative Eyes: negative Ears, nose, mouth, throat, and face: negative Respiratory: negative Cardiovascular: negative Gastrointestinal: negative Genitourinary:negative Integument/breast: negative Hematologic/lymphatic: negative Musculoskeletal:negative Neurological: negative Behavioral/Psych: negative Endocrine: negative Allergic/Immunologic: negative   PHYSICAL EXAMINATION: General appearance: alert, cooperative and no distress Head: Normocephalic, without obvious abnormality, atraumatic Neck: no adenopathy, no JVD, supple, symmetrical, trachea midline and thyroid not enlarged, symmetric, no tenderness/mass/nodules Lymph nodes: Cervical, supraclavicular, and axillary nodes normal. Resp: clear to auscultation  bilaterally Back: symmetric, no curvature. ROM normal. No CVA tenderness. Cardio: regular rate and rhythm, S1, S2 normal, no murmur, click, rub or gallop GI: soft, non-tender; bowel sounds normal; no masses,  no organomegaly Extremities: extremities normal, atraumatic, no cyanosis or edema Neurologic: Alert and oriented X 3, normal strength and tone. Normal symmetric reflexes. Normal coordination and gait  ECOG PERFORMANCE STATUS: 1 - Symptomatic but completely ambulatory  Blood pressure 118/79, pulse 93, temperature 97.5 F (36.4 C), temperature source Oral, resp. rate 18, height $RemoveBe'5\' 9"'PKKtkwRid$  (1.753 m), weight 210 lb (95.255 kg), SpO2 98.00%.  LABORATORY DATA: Lab Results  Component Value Date   WBC 6.6 07/15/2013   HGB 13.0 07/15/2013   HCT 38.4 07/15/2013   MCV 96.3 07/15/2013   PLT 195 07/15/2013      Chemistry      Component Value Date/Time   NA 140 07/15/2013 1259   NA 138 01/29/2013 0200   K 4.0 07/15/2013 1259   K 3.4* 01/29/2013 0200   CL 104 01/29/2013 0200   CL 109* 12/31/2012 0759   CO2 20* 07/15/2013 1259   CO2 28 01/29/2013 0200   BUN 12.9 07/15/2013 1259   BUN 18 01/29/2013 0200   CREATININE 1.0 07/15/2013 1259   CREATININE 1.10 01/29/2013 0200   CREATININE 0.98 01/18/2012 0934      Component Value Date/Time   CALCIUM 9.5 07/15/2013 1259   CALCIUM 9.0 01/29/2013 0200   ALKPHOS 49 07/15/2013 1259   ALKPHOS 50 01/29/2013 0200   AST 16 07/15/2013 1259   AST 18 01/29/2013 0200   ALT 17 07/15/2013 1259   ALT 16 01/29/2013 0200   BILITOT 0.71 07/15/2013 1259   BILITOT 0.4 01/29/2013 0200  RADIOGRAPHIC STUDIES: No results found.  ASSESSMENT AND PLAN: This is a very pleasant 73 years old white male with history of multiple myeloma status post induction chemotherapy with Velcade, Revlimid and dexamethasone followed by treatment with Carfilzomib, cyclophosphamide and dexamethasone with significant improvement in his disease followed by autologous peripheral blood stem cell transplant at [facility] on 03/20/2013. He is currently undergoing consolidation chemotherapy with Carfilzomib, Cytoxan and dexamethasone, status post 1 cycle. He tolerated the first cycle fairly well. For the scrotal mass, the patient will be seen by his urologist tomorrow. He was advised to continue his prophylactic acyclovir prescribed by the transplant team. He will continue treatment with Zometa every 2 months. He would come back for followup visit in 2 weeks for reevaluation and management any adverse effect of his treatment. He was advised to call immediately if he has any concerning symptoms in the interval. The patient voices understanding of current disease status and treatment options and is in agreement with the current care plan.  All questions were answered. The patient knows to call the clinic with any problems, questions or concerns. We can certainly see the patient much sooner if necessary.  I spent 15 minutes counseling the patient face to face. The total time spent in the appointment was 25 minutes.

## 2013-07-15 NOTE — Telephone Encounter (Signed)
Gave pt appt for lab,md and chemo for january 2015

## 2013-07-15 NOTE — Patient Instructions (Signed)
Continue treatment with consolidation chemotherapy today as scheduled.  Followup visit in 2 weeks.

## 2013-07-15 NOTE — Patient Instructions (Signed)
[place] Discharge Instructions for Patients Receiving Chemotherapy  Today you received the following chemotherapy agents Kyprolis and Cytoxan.  To help prevent nausea and vomiting after your treatment, we encourage you to take your nausea medication.   If you develop nausea and vomiting that is not controlled by your nausea medication, call the clinic.   BELOW ARE SYMPTOMS THAT SHOULD BE REPORTED IMMEDIATELY:  *FEVER GREATER THAN 100.5 F  *CHILLS WITH OR WITHOUT FEVER  NAUSEA AND VOMITING THAT IS NOT CONTROLLED WITH YOUR NAUSEA MEDICATION  *UNUSUAL SHORTNESS OF BREATH  *UNUSUAL BRUISING OR BLEEDING  TENDERNESS IN MOUTH AND THROAT WITH OR WITHOUT PRESENCE OF ULCERS  *URINARY PROBLEMS  *BOWEL PROBLEMS  UNUSUAL RASH Items with * indicate a potential emergency and should be followed up as soon as possible.  Feel free to call the clinic you have any questions or concerns. The clinic phone number is [phone].

## 2013-07-16 ENCOUNTER — Ambulatory Visit (HOSPITAL_BASED_OUTPATIENT_CLINIC_OR_DEPARTMENT_OTHER): Payer: Medicare Other

## 2013-07-16 VITALS — BP 122/71 | HR 73 | Temp 97.5°F | Resp 20

## 2013-07-16 DIAGNOSIS — Z5112 Encounter for antineoplastic immunotherapy: Secondary | ICD-10-CM

## 2013-07-16 DIAGNOSIS — C9000 Multiple myeloma not having achieved remission: Secondary | ICD-10-CM

## 2013-07-16 LAB — IGG, IGA, IGM
IGG (IMMUNOGLOBIN G), SERUM: 587 mg/dL — AB (ref 650–1600)
IgA: 24 mg/dL — ABNORMAL LOW (ref 68–379)
IgM, Serum: 23 mg/dL — ABNORMAL LOW (ref 41–251)

## 2013-07-16 LAB — KAPPA/LAMBDA LIGHT CHAINS
Kappa free light chain: 1.03 mg/dL (ref 0.33–1.94)
Kappa:Lambda Ratio: 0.27 (ref 0.26–1.65)
Lambda Free Lght Chn: 3.76 mg/dL — ABNORMAL HIGH (ref 0.57–2.63)

## 2013-07-16 LAB — BETA 2 MICROGLOBULIN, SERUM: Beta-2 Microglobulin: 1.34 mg/L (ref 1.01–1.73)

## 2013-07-16 MED ORDER — DEXAMETHASONE SODIUM PHOSPHATE 10 MG/ML IJ SOLN
INTRAMUSCULAR | Status: AC
  Filled 2013-07-16: qty 1

## 2013-07-16 MED ORDER — HEPARIN SOD (PORK) LOCK FLUSH 100 UNIT/ML IV SOLN
500.0000 [IU] | Freq: Once | INTRAVENOUS | Status: AC | PRN
  Administered 2013-07-16: 500 [IU]
  Filled 2013-07-16: qty 5

## 2013-07-16 MED ORDER — DEXAMETHASONE SODIUM PHOSPHATE 10 MG/ML IJ SOLN
10.0000 mg | Freq: Once | INTRAMUSCULAR | Status: AC
  Administered 2013-07-16: 10 mg via INTRAVENOUS

## 2013-07-16 MED ORDER — ONDANSETRON 8 MG/NS 50 ML IVPB
INTRAVENOUS | Status: AC
  Filled 2013-07-16: qty 8

## 2013-07-16 MED ORDER — SODIUM CHLORIDE 0.9 % IJ SOLN
10.0000 mL | INTRAMUSCULAR | Status: DC | PRN
  Administered 2013-07-16: 10 mL
  Filled 2013-07-16: qty 10

## 2013-07-16 MED ORDER — ONDANSETRON 8 MG/50ML IVPB (CHCC)
8.0000 mg | Freq: Once | INTRAVENOUS | Status: AC
  Administered 2013-07-16: 8 mg via INTRAVENOUS

## 2013-07-16 MED ORDER — DEXTROSE 5 % IV SOLN
36.0000 mg/m2 | Freq: Once | INTRAVENOUS | Status: AC
  Administered 2013-07-16: 76 mg via INTRAVENOUS
  Filled 2013-07-16: qty 38

## 2013-07-16 MED ORDER — SODIUM CHLORIDE 0.9 % IV SOLN: Freq: Once | INTRAVENOUS | Status: DC

## 2013-07-16 MED ORDER — SODIUM CHLORIDE 0.9 % IV SOLN
Freq: Once | INTRAVENOUS | Status: AC
  Administered 2013-07-16: 14:00:00 via INTRAVENOUS

## 2013-07-16 NOTE — Patient Instructions (Signed)
[place] Discharge Instructions for Patients Receiving Chemotherapy  Today you received the following chemotherapy agent Kyprolis.  To help prevent nausea and vomiting after your treatment, we encourage you to take your nausea medication.   If you develop nausea and vomiting that is not controlled by your nausea medication, call the clinic.   BELOW ARE SYMPTOMS THAT SHOULD BE REPORTED IMMEDIATELY:  *FEVER GREATER THAN 100.5 F  *CHILLS WITH OR WITHOUT FEVER  NAUSEA AND VOMITING THAT IS NOT CONTROLLED WITH YOUR NAUSEA MEDICATION  *UNUSUAL SHORTNESS OF BREATH  *UNUSUAL BRUISING OR BLEEDING  TENDERNESS IN MOUTH AND THROAT WITH OR WITHOUT PRESENCE OF ULCERS  *URINARY PROBLEMS  *BOWEL PROBLEMS  UNUSUAL RASH Items with * indicate a potential emergency and should be followed up as soon as possible.  Feel free to call the clinic you have any questions or concerns. The clinic phone number is [phone].

## 2013-07-16 NOTE — Telephone Encounter (Signed)
Pt called and left msg today.  Pt went to his urologist and pt states that he is not concerned about the lump that was found at all.  Prostate exam was also done and urologist said that was fine as well.  SLJ

## 2013-07-17 ENCOUNTER — Ambulatory Visit: Payer: Medicare Other | Admitting: Family Medicine

## 2013-07-22 ENCOUNTER — Other Ambulatory Visit (HOSPITAL_BASED_OUTPATIENT_CLINIC_OR_DEPARTMENT_OTHER): Payer: Medicare Other

## 2013-07-22 ENCOUNTER — Ambulatory Visit (HOSPITAL_BASED_OUTPATIENT_CLINIC_OR_DEPARTMENT_OTHER): Payer: Medicare Other

## 2013-07-22 VITALS — BP 135/76 | HR 90 | Temp 94.7°F | Resp 18

## 2013-07-22 DIAGNOSIS — Z5112 Encounter for antineoplastic immunotherapy: Secondary | ICD-10-CM

## 2013-07-22 DIAGNOSIS — C9000 Multiple myeloma not having achieved remission: Secondary | ICD-10-CM

## 2013-07-22 DIAGNOSIS — Z5111 Encounter for antineoplastic chemotherapy: Secondary | ICD-10-CM

## 2013-07-22 LAB — CBC WITH DIFFERENTIAL/PLATELET
BASO%: 0.4 % (ref 0.0–2.0)
Basophils Absolute: 0 10*3/uL (ref 0.0–0.1)
EOS%: 0.2 % (ref 0.0–7.0)
Eosinophils Absolute: 0 10*3/uL (ref 0.0–0.5)
HEMATOCRIT: 38.8 % (ref 38.4–49.9)
HGB: 13 g/dL (ref 13.0–17.1)
LYMPH%: 1.9 % — AB (ref 14.0–49.0)
MCH: 33 pg (ref 27.2–33.4)
MCHC: 33.6 g/dL (ref 32.0–36.0)
MCV: 98.3 fL — ABNORMAL HIGH (ref 79.3–98.0)
MONO#: 0 10*3/uL — AB (ref 0.1–0.9)
MONO%: 0.5 % (ref 0.0–14.0)
NEUT#: 10.1 10*3/uL — ABNORMAL HIGH (ref 1.5–6.5)
NEUT%: 97 % — AB (ref 39.0–75.0)
PLATELETS: 151 10*3/uL (ref 140–400)
RBC: 3.94 10*6/uL — AB (ref 4.20–5.82)
RDW: 15.7 % — ABNORMAL HIGH (ref 11.0–14.6)
WBC: 10.4 10*3/uL — AB (ref 4.0–10.3)
lymph#: 0.2 10*3/uL — ABNORMAL LOW (ref 0.9–3.3)

## 2013-07-22 LAB — COMPREHENSIVE METABOLIC PANEL (CC13)
ALK PHOS: 51 U/L (ref 40–150)
ALT: 16 U/L (ref 0–55)
AST: 17 U/L (ref 5–34)
Albumin: 3.9 g/dL (ref 3.5–5.0)
Anion Gap: 11 mEq/L (ref 3–11)
BILIRUBIN TOTAL: 0.73 mg/dL (ref 0.20–1.20)
BUN: 14.4 mg/dL (ref 7.0–26.0)
CO2: 22 mEq/L (ref 22–29)
CREATININE: 1.1 mg/dL (ref 0.7–1.3)
Calcium: 9.4 mg/dL (ref 8.4–10.4)
Chloride: 107 mEq/L (ref 98–109)
Glucose: 185 mg/dl — ABNORMAL HIGH (ref 70–140)
Potassium: 4.4 mEq/L (ref 3.5–5.1)
Sodium: 140 mEq/L (ref 136–145)
Total Protein: 6.3 g/dL — ABNORMAL LOW (ref 6.4–8.3)

## 2013-07-22 LAB — LACTATE DEHYDROGENASE (CC13): LDH: 161 U/L (ref 125–245)

## 2013-07-22 MED ORDER — SODIUM CHLORIDE 0.9 % IJ SOLN
10.0000 mL | INTRAMUSCULAR | Status: DC | PRN
  Administered 2013-07-22: 10 mL
  Filled 2013-07-22: qty 10

## 2013-07-22 MED ORDER — ONDANSETRON 8 MG/NS 50 ML IVPB
INTRAVENOUS | Status: AC
  Filled 2013-07-22: qty 8

## 2013-07-22 MED ORDER — DEXAMETHASONE SODIUM PHOSPHATE 10 MG/ML IJ SOLN
10.0000 mg | Freq: Once | INTRAMUSCULAR | Status: AC
  Administered 2013-07-22: 10 mg via INTRAVENOUS

## 2013-07-22 MED ORDER — ONDANSETRON 8 MG/50ML IVPB (CHCC)
8.0000 mg | Freq: Once | INTRAVENOUS | Status: AC
  Administered 2013-07-22: 8 mg via INTRAVENOUS

## 2013-07-22 MED ORDER — DEXAMETHASONE SODIUM PHOSPHATE 10 MG/ML IJ SOLN
INTRAMUSCULAR | Status: AC
  Filled 2013-07-22: qty 1

## 2013-07-22 MED ORDER — SODIUM CHLORIDE 0.9 % IV SOLN
Freq: Once | INTRAVENOUS | Status: AC
  Administered 2013-07-22: 14:00:00 via INTRAVENOUS

## 2013-07-22 MED ORDER — DEXTROSE 5 % IV SOLN
36.0000 mg/m2 | Freq: Once | INTRAVENOUS | Status: AC
  Administered 2013-07-22: 76 mg via INTRAVENOUS
  Filled 2013-07-22: qty 38

## 2013-07-22 MED ORDER — SODIUM CHLORIDE 0.9 % IV SOLN
300.0000 mg/m2 | Freq: Once | INTRAVENOUS | Status: AC
  Administered 2013-07-22: 640 mg via INTRAVENOUS
  Filled 2013-07-22: qty 32

## 2013-07-22 MED ORDER — HEPARIN SOD (PORK) LOCK FLUSH 100 UNIT/ML IV SOLN
500.0000 [IU] | Freq: Once | INTRAVENOUS | Status: AC | PRN
  Administered 2013-07-22: 500 [IU]
  Filled 2013-07-22: qty 5

## 2013-07-22 NOTE — Patient Instructions (Signed)
[place] Discharge Instructions for Patients Receiving Chemotherapy  Today you received the following chemotherapy agents Kyprolis/Cytoxan To help prevent nausea and vomiting after your treatment, we encourage you to take your nausea medication as prescribed.  If you develop nausea and vomiting that is not controlled by your nausea medication, call the clinic.   BELOW ARE SYMPTOMS THAT SHOULD BE REPORTED IMMEDIATELY:  *FEVER GREATER THAN 100.5 F  *CHILLS WITH OR WITHOUT FEVER  NAUSEA AND VOMITING THAT IS NOT CONTROLLED WITH YOUR NAUSEA MEDICATION  *UNUSUAL SHORTNESS OF BREATH  *UNUSUAL BRUISING OR BLEEDING  TENDERNESS IN MOUTH AND THROAT WITH OR WITHOUT PRESENCE OF ULCERS  *URINARY PROBLEMS  *BOWEL PROBLEMS  UNUSUAL RASH Items with * indicate a potential emergency and should be followed up as soon as possible.  Feel free to call the clinic you have any questions or concerns. The clinic phone number is [phone].

## 2013-07-23 ENCOUNTER — Ambulatory Visit (HOSPITAL_BASED_OUTPATIENT_CLINIC_OR_DEPARTMENT_OTHER): Payer: Medicare Other

## 2013-07-23 VITALS — BP 112/74 | HR 69 | Temp 97.3°F | Resp 18

## 2013-07-23 DIAGNOSIS — Z5112 Encounter for antineoplastic immunotherapy: Secondary | ICD-10-CM

## 2013-07-23 DIAGNOSIS — C9000 Multiple myeloma not having achieved remission: Secondary | ICD-10-CM

## 2013-07-23 MED ORDER — ONDANSETRON 8 MG/50ML IVPB (CHCC)
8.0000 mg | Freq: Once | INTRAVENOUS | Status: AC
  Administered 2013-07-23: 8 mg via INTRAVENOUS

## 2013-07-23 MED ORDER — DEXAMETHASONE SODIUM PHOSPHATE 10 MG/ML IJ SOLN
INTRAMUSCULAR | Status: AC
  Filled 2013-07-23: qty 1

## 2013-07-23 MED ORDER — HEPARIN SOD (PORK) LOCK FLUSH 100 UNIT/ML IV SOLN
500.0000 [IU] | Freq: Once | INTRAVENOUS | Status: AC | PRN
  Administered 2013-07-23: 500 [IU]
  Filled 2013-07-23: qty 5

## 2013-07-23 MED ORDER — DEXAMETHASONE SODIUM PHOSPHATE 10 MG/ML IJ SOLN
10.0000 mg | Freq: Once | INTRAMUSCULAR | Status: AC
  Administered 2013-07-23: 10 mg via INTRAVENOUS

## 2013-07-23 MED ORDER — SODIUM CHLORIDE 0.9 % IV SOLN
Freq: Once | INTRAVENOUS | Status: AC
  Administered 2013-07-23: 15:00:00 via INTRAVENOUS

## 2013-07-23 MED ORDER — ONDANSETRON 8 MG/NS 50 ML IVPB
INTRAVENOUS | Status: AC
  Filled 2013-07-23: qty 8

## 2013-07-23 MED ORDER — SODIUM CHLORIDE 0.9 % IJ SOLN
10.0000 mL | INTRAMUSCULAR | Status: DC | PRN
  Administered 2013-07-23: 10 mL
  Filled 2013-07-23: qty 10

## 2013-07-23 MED ORDER — DEXTROSE 5 % IV SOLN
36.0000 mg/m2 | Freq: Once | INTRAVENOUS | Status: AC
  Administered 2013-07-23: 76 mg via INTRAVENOUS
  Filled 2013-07-23: qty 38

## 2013-07-23 NOTE — Patient Instructions (Signed)
[place] Discharge Instructions for Patients Receiving Chemotherapy  Today you received the following chemotherapy agents: Kyprolis  To help prevent nausea and vomiting after your treatment, we encourage you to take your nausea medication as prescribed.   If you develop nausea and vomiting that is not controlled by your nausea medication, call the clinic.   BELOW ARE SYMPTOMS THAT SHOULD BE REPORTED IMMEDIATELY:  *FEVER GREATER THAN 100.5 F  *CHILLS WITH OR WITHOUT FEVER  NAUSEA AND VOMITING THAT IS NOT CONTROLLED WITH YOUR NAUSEA MEDICATION  *UNUSUAL SHORTNESS OF BREATH  *UNUSUAL BRUISING OR BLEEDING  TENDERNESS IN MOUTH AND THROAT WITH OR WITHOUT PRESENCE OF ULCERS  *URINARY PROBLEMS  *BOWEL PROBLEMS  UNUSUAL RASH Items with * indicate a potential emergency and should be followed up as soon as possible.  Feel free to call the clinic you have any questions or concerns. The clinic phone number is [phone].

## 2013-07-24 ENCOUNTER — Other Ambulatory Visit: Payer: Self-pay | Admitting: Urology

## 2013-07-24 DIAGNOSIS — E349 Endocrine disorder, unspecified: Secondary | ICD-10-CM

## 2013-07-24 MED ORDER — DEXAMETHASONE SODIUM PHOSPHATE 10 MG/ML IJ SOLN
INTRAMUSCULAR | Status: AC
  Filled 2013-07-24: qty 1

## 2013-07-24 MED ORDER — ONDANSETRON 8 MG/NS 50 ML IVPB
INTRAVENOUS | Status: AC
  Filled 2013-07-24: qty 8

## 2013-07-29 ENCOUNTER — Ambulatory Visit (HOSPITAL_BASED_OUTPATIENT_CLINIC_OR_DEPARTMENT_OTHER): Payer: Medicare Other | Admitting: Physician Assistant

## 2013-07-29 ENCOUNTER — Ambulatory Visit (HOSPITAL_BASED_OUTPATIENT_CLINIC_OR_DEPARTMENT_OTHER): Payer: Medicare Other

## 2013-07-29 ENCOUNTER — Encounter: Payer: Self-pay | Admitting: Physician Assistant

## 2013-07-29 ENCOUNTER — Other Ambulatory Visit (HOSPITAL_BASED_OUTPATIENT_CLINIC_OR_DEPARTMENT_OTHER): Payer: Medicare Other

## 2013-07-29 ENCOUNTER — Telehealth: Payer: Self-pay | Admitting: Internal Medicine

## 2013-07-29 ENCOUNTER — Other Ambulatory Visit: Payer: Medicare Other

## 2013-07-29 VITALS — BP 133/75 | HR 87 | Temp 97.7°F | Resp 19 | Ht 69.0 in | Wt 213.6 lb

## 2013-07-29 DIAGNOSIS — C9000 Multiple myeloma not having achieved remission: Secondary | ICD-10-CM

## 2013-07-29 DIAGNOSIS — C9001 Multiple myeloma in remission: Secondary | ICD-10-CM

## 2013-07-29 DIAGNOSIS — Z5112 Encounter for antineoplastic immunotherapy: Secondary | ICD-10-CM

## 2013-07-29 DIAGNOSIS — Z5111 Encounter for antineoplastic chemotherapy: Secondary | ICD-10-CM

## 2013-07-29 LAB — CBC WITH DIFFERENTIAL/PLATELET
BASO%: 0.6 % (ref 0.0–2.0)
BASOS ABS: 0 10*3/uL (ref 0.0–0.1)
EOS ABS: 0 10*3/uL (ref 0.0–0.5)
EOS%: 0.7 % (ref 0.0–7.0)
HEMATOCRIT: 38.2 % — AB (ref 38.4–49.9)
HGB: 12.9 g/dL — ABNORMAL LOW (ref 13.0–17.1)
LYMPH%: 2.8 % — AB (ref 14.0–49.0)
MCH: 33.4 pg (ref 27.2–33.4)
MCHC: 33.9 g/dL (ref 32.0–36.0)
MCV: 98.5 fL — ABNORMAL HIGH (ref 79.3–98.0)
MONO#: 0.1 10*3/uL (ref 0.1–0.9)
MONO%: 1.2 % (ref 0.0–14.0)
NEUT%: 94.7 % — AB (ref 39.0–75.0)
NEUTROS ABS: 6 10*3/uL (ref 1.5–6.5)
PLATELETS: 158 10*3/uL (ref 140–400)
RBC: 3.88 10*6/uL — AB (ref 4.20–5.82)
RDW: 15.6 % — ABNORMAL HIGH (ref 11.0–14.6)
WBC: 6.4 10*3/uL (ref 4.0–10.3)
lymph#: 0.2 10*3/uL — ABNORMAL LOW (ref 0.9–3.3)

## 2013-07-29 LAB — COMPREHENSIVE METABOLIC PANEL (CC13)
ALT: 16 U/L (ref 0–55)
ANION GAP: 10 meq/L (ref 3–11)
AST: 16 U/L (ref 5–34)
Albumin: 3.8 g/dL (ref 3.5–5.0)
Alkaline Phosphatase: 48 U/L (ref 40–150)
BILIRUBIN TOTAL: 0.67 mg/dL (ref 0.20–1.20)
BUN: 12.8 mg/dL (ref 7.0–26.0)
CO2: 23 mEq/L (ref 22–29)
Calcium: 9.5 mg/dL (ref 8.4–10.4)
Chloride: 109 mEq/L (ref 98–109)
Creatinine: 0.9 mg/dL (ref 0.7–1.3)
GLUCOSE: 130 mg/dL (ref 70–140)
Potassium: 4.1 mEq/L (ref 3.5–5.1)
Sodium: 142 mEq/L (ref 136–145)
Total Protein: 6.4 g/dL (ref 6.4–8.3)

## 2013-07-29 MED ORDER — DEXAMETHASONE SODIUM PHOSPHATE 10 MG/ML IJ SOLN
10.0000 mg | Freq: Once | INTRAMUSCULAR | Status: AC
  Administered 2013-07-29: 10 mg via INTRAVENOUS

## 2013-07-29 MED ORDER — CYCLOPHOSPHAMIDE CHEMO INJECTION 1 GM
300.0000 mg/m2 | Freq: Once | INTRAMUSCULAR | Status: AC
  Administered 2013-07-29: 640 mg via INTRAVENOUS
  Filled 2013-07-29: qty 32

## 2013-07-29 MED ORDER — SODIUM CHLORIDE 0.9 % IJ SOLN
10.0000 mL | INTRAMUSCULAR | Status: DC | PRN
  Administered 2013-07-29: 10 mL
  Filled 2013-07-29: qty 10

## 2013-07-29 MED ORDER — DEXTROSE 5 % IV SOLN
36.0000 mg/m2 | Freq: Once | INTRAVENOUS | Status: AC
  Administered 2013-07-29: 76 mg via INTRAVENOUS
  Filled 2013-07-29: qty 38

## 2013-07-29 MED ORDER — DEXAMETHASONE SODIUM PHOSPHATE 10 MG/ML IJ SOLN
INTRAMUSCULAR | Status: AC
  Filled 2013-07-29: qty 1

## 2013-07-29 MED ORDER — HEPARIN SOD (PORK) LOCK FLUSH 100 UNIT/ML IV SOLN
500.0000 [IU] | Freq: Once | INTRAVENOUS | Status: AC | PRN
  Administered 2013-07-29: 500 [IU]
  Filled 2013-07-29: qty 5

## 2013-07-29 MED ORDER — SODIUM CHLORIDE 0.9 % IV SOLN
Freq: Once | INTRAVENOUS | Status: AC
  Administered 2013-07-29: 14:00:00 via INTRAVENOUS

## 2013-07-29 MED ORDER — ONDANSETRON 8 MG/NS 50 ML IVPB
INTRAVENOUS | Status: AC
  Filled 2013-07-29: qty 8

## 2013-07-29 MED ORDER — ONDANSETRON 8 MG/50ML IVPB (CHCC)
8.0000 mg | Freq: Once | INTRAVENOUS | Status: AC
  Administered 2013-07-29: 8 mg via INTRAVENOUS

## 2013-07-29 NOTE — Patient Instructions (Signed)
Follow up with Dr.[name] in 2 weeks to discuss maintenance therapy as well as receive your next Zometa infusion

## 2013-07-29 NOTE — Telephone Encounter (Signed)
Gave pt appt for lab,md and flush for February 2015

## 2013-07-29 NOTE — Patient Instructions (Signed)
[place] Discharge Instructions for Patients Receiving Chemotherapy  Today you received the following chemotherapy agents: cytoxan, kyprolis  To help prevent nausea and vomiting after your treatment, we encourage you to take your nausea medication.  Take it as often as prescribed.     If you develop nausea and vomiting that is not controlled by your nausea medication, call the clinic. If it is after clinic hours your family physician or the after hours number for the clinic or go to the Emergency Department.   BELOW ARE SYMPTOMS THAT SHOULD BE REPORTED IMMEDIATELY:  *FEVER GREATER THAN 100.5 F  *CHILLS WITH OR WITHOUT FEVER  NAUSEA AND VOMITING THAT IS NOT CONTROLLED WITH YOUR NAUSEA MEDICATION  *UNUSUAL SHORTNESS OF BREATH  *UNUSUAL BRUISING OR BLEEDING  TENDERNESS IN MOUTH AND THROAT WITH OR WITHOUT PRESENCE OF ULCERS  *URINARY PROBLEMS  *BOWEL PROBLEMS  UNUSUAL RASH Items with * indicate a potential emergency and should be followed up as soon as possible.  Feel free to call the clinic you have any questions or concerns. The clinic phone number is [phone].   I have been informed and understand all the instructions given to me. I know to contact the clinic, my physician, or go to the Emergency Department if any problems should occur. I do not have any questions at this time, but understand that I may call the clinic during office hours   should I have any questions or need assistance in obtaining follow up care.    __________________________________________  _____________  __________ Signature of Patient or Authorized Representative            Date                   Time    __________________________________________ Nurse's Signature

## 2013-07-29 NOTE — Progress Notes (Signed)
[place] Telephone:(336) [phone]   [phone]  OFFICE PROGRESS NOTE  [name],[name], MD [place] [address]  DIAGNOSIS: Stage IIA multiple myeloma diagnosed in July of 2013   PRIOR THERAPY:  1) Systemic chemotherapy with subcutaneous Velcade 1.3 mg/M2 on days 1, 4, 8 and 11 every 3 weeks in addition to Revlimid 25 mg by mouth daily for 14 days every 3 weeks and dexamethasone 40 mg on a weekly basis. Status post 4 planned cycles.  2) Systemic chemotherapy with subcutaneous Velcade 1.3 mg/M2 on days 1, 4, 8 and 11 every 3 weeks in addition to Revlimid 25 mg by mouth daily for 14 days every 3 weeks and dexamethasone 40 mg on a weekly basis. 4 more cycles are planned and he is status post 4 cycles of the second set of 4 cycles, now status post a total of 10 cycles. 3) Systemic chemotherapy with Carfilzomib 36 mg/M2 on days 1, 2, 8, 9, 15 and 16 in addition to cyclophosphamide 300 mg/M2 IV on days 1, 8 and 15 as well as dexamethasone on days 1, 8, 15 and 22 every 4 weeks. Status post 4 cycles. 4) Zometa 4 mg IV every month. 5) autologous peripheral blood stem cell transplant on 03/20/2013 at [place]:  1) Consolidation chemotherapy with Carfilzomib 36 mg/M2 on days 1, 2, 8, 9, 15 and 16 every 4 weeks in addition to cyclophosphamide 300 mg/M2 on days 1, 8 and 15 as well as dexamethasone 40 mg on a weekly basis every 4 weeks. Status post 1 cycle. First cycle 06/11/2013. Status post 1 cycle as well as days 1,2,8 and 9 of cycle #2 2) Zometa 4 mg IV every 2 months. Last received 06/11/2013   INTERVAL HISTORY: [name] 73 y.o. male returns to the clinic today for followup visit.  The patient is feeling fine today with no specific complaints. He recently underwent autologous peripheral blood stem cell transplant at [facility] on 03/20/2013. He was seen recently by Dr. Ok Edwards at [facility] and he recommended for  him to proceed with 2 cycles of consolidation chemotherapy with Carfilzomib, dexamethasone and cyclophosphamide. He is status post 1 cycle, as well as days 1, 2, 8 and 9 of cycle 2. He reports that he was evaluated by his urologist and was found to have a low testosterone level. The patient is not interested in testosterone replacement therapy. He states that he is scheduled for an MRI of his pituitary gland and this is been ordered by the urologist. Patient is looking forward to completing his consolidation chemotherapy and proceeding with maintenance chemotherapy.  His recent myeloma panel at [facility] showed significant improvement in his disease as did the protein studies have her performed here in our office on 07/15/2013. He denied having any significant chest pain, shortness of breath, cough or hemoptysis. The patient denied having any nausea or vomiting. Has no fever or chills.    MEDICAL HISTORY: Past Medical History  Diagnosis Date  . GERD (gastroesophageal reflux disease)   . Adenomatous polyp of colon 1996  . Arthritis     Osteoarthritis right knee, spine, wrist  . Benign prostatic hypertrophy     (Dr. Karsten Ro)  . History of chronic prostatitis   . History of melanoma     Back (Dr. Wilhemina Bonito); early melanoma R arm 03/2011  . Diverticulosis   . Arthritis of hand, right  Right thumb (Dr. Daylene Katayama)  . Degenerative disc disease   . Hemorrhoids   . Erosive esophagitis   . Back ache     r/o Multiple Myeloma  . Multiple myeloma 2013    ALLERGIES:  is allergic to penicillins.  MEDICATIONS:  Current Outpatient Prescriptions  Medication Sig Dispense Refill  . acetaminophen (TYLENOL) 325 MG tablet Take 650 mg by mouth. prn      . ascorbic acid (VITAMIN C) 500 MG tablet Take 500 mg by mouth daily.      Marland Kitchen aspirin EC 81 MG tablet Take 81 mg by mouth every other day.      . B Complex-C (B-COMPLEX WITH VITAMIN C) tablet Take 1 tablet by mouth daily.      . celecoxib (CELEBREX) 200  MG capsule Take 200 mg by mouth daily as needed for pain.      . cholecalciferol (VITAMIN D) 1000 UNITS tablet Take 1,000 Units by mouth daily.      Marland Kitchen dexamethasone (DECADRON) 4 MG tablet Take 10 tablets (40 mg total) by mouth once a week. Takes on Monday am.  120 tablet  0  . fish oil-omega-3 fatty acids 1000 MG capsule Take 1 g by mouth daily.       Marland Kitchen HYDROcodone-acetaminophen (NORCO) 7.5-325 MG per tablet Take 1 tablet by mouth every 6 (six) hours as needed.  30 tablet  0  . lidocaine-prilocaine (EMLA) cream Apply topically as needed. Apply to the port 1 hour before chemo  30 g  0  . Multiple Vitamin (MULTIVITAMIN WITH MINERALS) TABS Take 1 tablet by mouth daily.      . ondansetron (ZOFRAN-ODT) 4 MG disintegrating tablet Take 4 mg by mouth every 12 (twelve) hours as needed for nausea.      . pantoprazole (PROTONIX) 40 MG tablet Take 40 mg by mouth. Take 1 tablet (40 mg total) by mouth Two (2) times a day.      Marland Kitchen PRESCRIPTION MEDICATION Inject into the vein every 21 ( twenty-one) days. Kyprolis and Cytoxan infusion every 3 weeks (Monday and Tuesday)      . Probiotic Product (ALIGN) 4 MG CAPS Take 1 capsule by mouth daily.      . prochlorperazine (COMPAZINE) 10 MG tablet 10 mg every 6 (six) hours as needed.       Marland Kitchen PROLENSA 0.07 % SOLN       . temazepam (RESTORIL) 15 MG capsule Take 1 capsule (15 mg total) by mouth at bedtime. Take 1 capsule (15 mg total) by mouth nightly as needed for sleep.  30 capsule  0  . valACYclovir (VALTREX) 500 MG tablet Take 500 mg by mouth. Take 1 tablet (500 mg total) by mouth daily.      Marland Kitchen zolendronic acid (ZOMETA) 4 MG/5ML injection Inject 4 mg into the vein every 28 (twenty-eight) days.      . bacitracin ointment Apply topically. Apply as directed      . BESIVANCE 0.6 % SUSP       . clonazePAM (KLONOPIN) 0.5 MG tablet Take 0.5 mg by mouth. Take 1 tablet (0.5 mg total) by mouth Two (2) times a day.      . DUREZOL 0.05 % EMUL       . oxycodone (OXY-IR) 5 MG capsule  Take 5 mg by mouth every 6 (six) hours as needed for pain.       No current facility-administered medications for this visit.   Facility-Administered Medications Ordered in Other Visits  Medication  Dose Route Frequency Provider Last Rate Last Dose  . 0.9 %  sodium chloride infusion   Intravenous Once Curt Bears, MD      . carfilzomib (KYPROLIS) 76 mg in dextrose 5 % 50 mL chemo infusion  36 mg/m2 (Treatment Plan Actual) Intravenous Once Curt Bears, MD      . cyclophosphamide (CYTOXAN) 640 mg in sodium chloride 0.9 % 250 mL chemo infusion  300 mg/m2 (Treatment Plan Actual) Intravenous Once Curt Bears, MD      . heparin lock flush 100 unit/mL  500 Units Intracatheter Once PRN Curt Bears, MD      . ondansetron [facility]) tablet 8 mg  8 mg Oral Once Curt Bears, MD      . sodium chloride 0.9 % injection 10 mL  10 mL Intracatheter PRN Curt Bears, MD   10 mL at 06/11/13 1616  . sodium chloride 0.9 % injection 10 mL  10 mL Intracatheter PRN Curt Bears, MD        SURGICAL HISTORY:  Past Surgical History  Procedure Laterality Date  . Total knee replaced  2000    Left knee  . Appendectomy    . Colonoscopy  2009  . Esophagogastroduodenoscopy  2009    mild inflammation at esophagogastric junction  . Excision of melanoma      back ( x3)  . Great toe surgery      bilateral  . Inguinal hernia repair      left, x 2  . Total knee arthroplasty  06/08/2011    Procedure: TOTAL KNEE ARTHROPLASTY;  Surgeon: Ninetta Lights, MD;  Location: [facility];  Service: Orthopedics;  Laterality: Right;  osteonics  . [place]  10/2012    REVIEW OF SYSTEMS:  Constitutional: negative Eyes: negative Ears, nose, mouth, throat, and face: negative Respiratory: negative Cardiovascular: negative Gastrointestinal: negative Genitourinary:negative Integument/breast: negative Hematologic/lymphatic: negative Musculoskeletal:negative Neurological: negative Behavioral/Psych:  negative Endocrine: negative Allergic/Immunologic: negative   PHYSICAL EXAMINATION: General appearance: alert, cooperative and no distress Head: Normocephalic, without obvious abnormality, atraumatic Neck: no adenopathy, no JVD, supple, symmetrical, trachea midline and thyroid not enlarged, symmetric, no tenderness/mass/nodules Lymph nodes: Cervical, supraclavicular, and axillary nodes normal. Resp: clear to auscultation bilaterally Back: symmetric, no curvature. ROM normal. No CVA tenderness. Cardio: regular rate and rhythm, S1, S2 normal, no murmur, click, rub or gallop GI: soft, non-tender; bowel sounds normal; no masses,  no organomegaly Extremities: extremities normal, atraumatic, no cyanosis or edema Neurologic: Alert and oriented X 3, normal strength and tone. Normal symmetric reflexes. Normal coordination and gait  ECOG PERFORMANCE STATUS: 1 - Symptomatic but completely ambulatory  Blood pressure 133/75, pulse 87, temperature 97.7 F (36.5 C), temperature source Oral, resp. rate 19, height _0  (1.753 m), weight 213 lb 9.6 oz (96.888 kg).  LABORATORY DATA: Lab Results  Component Value Date   WBC 6.4 07/29/2013   HGB 12.9* 07/29/2013   HCT 38.2* 07/29/2013   MCV 98.5* 07/29/2013   PLT 158 07/29/2013      Chemistry      Component Value Date/Time   NA 142 07/29/2013 1131   NA 138 01/29/2013 0200   K 4.1 07/29/2013 1131   K 3.4* 01/29/2013 0200   CL 104 01/29/2013 0200   CL 109* 12/31/2012 0759   CO2 23 07/29/2013 1131   CO2 28 01/29/2013 0200   BUN 12.8 07/29/2013 1131   BUN 18 01/29/2013 0200   CREATININE 0.9 07/29/2013 1131   CREATININE 1.10 01/29/2013 0200   CREATININE 0.98  01/18/2012 0934      Component Value Date/Time   CALCIUM 9.5 07/29/2013 1131   CALCIUM 9.0 01/29/2013 0200   ALKPHOS 48 07/29/2013 1131   ALKPHOS 50 01/29/2013 0200   AST 16 07/29/2013 1131   AST 18 01/29/2013 0200   ALT 16 07/29/2013 1131   ALT 16 01/29/2013 0200   BILITOT 0.67 07/29/2013 1131   BILITOT 0.4  01/29/2013 0200       RADIOGRAPHIC STUDIES: No results found.  ASSESSMENT AND PLAN: This is a very pleasant 73 years old white male with history of multiple myeloma status post induction chemotherapy with Velcade, Revlimid and dexamethasone followed by treatment with Carfilzomib, cyclophosphamide and dexamethasone with significant improvement in his disease followed by autologous peripheral blood stem cell transplant at [facility] on 03/20/2013. He is currently undergoing consolidation chemotherapy with Carfilzomib, Cytoxan and dexamethasone, status post 1 cycle, as well as days 1, 2, 8 and 9 of cycle #2. He is tolerating the consolidation chemotherapy without difficulty. He'll continue his workup by the urologist regarding his low testosterone level as scheduled. He was advised to continue his prophylactic acyclovir prescribed by the transplant team. He will continue treatment with Zometa every 2 months, next due 08/12/2013. He will follow with Dr. [name] in 2 weeks for reevaluation and management any adverse effect of his treatment as well as to discuss maintenance therapy. He was advised to call immediately if he has any concerning symptoms in the interval. The patient voices understanding of current disease status and treatment options and is in agreement with the current care plan.  All questions were answered. The patient knows to call the clinic with any problems, questions or concerns. We can certainly see the patient much sooner if necessary.  Carlton Adam PA-C  ADDENDUM: Hematology/Oncology Attending: I had the face to face encounter with the patient. I recommended his care plan. This is a very pleasant 73 years old white male with history of multiple myeloma status post induction chemotherapy followed by autologous peripheral blood stem cell transplant and he is currently undergoing the second cycle of consolidation chemotherapy with Carfilzomib, Cytoxan and  dexamethasone. We'll proceed with day 15 and 16 of this cycle as scheduled. The patient would come back for follow up visit in 2 weeks for evaluation and discussion of maintenance chemotherapy. He would be treated with Zometa every 2 months for the bone disease. The patient was advised to call immediately if he has any concerning symptoms in the interval.  Disclaimer: This note was dictated with voice recognition software. Similar sounding words can inadvertently be transcribed and may not be corrected upon review. Eilleen Kempf., MD 07/30/2013

## 2013-07-30 ENCOUNTER — Telehealth: Payer: Self-pay | Admitting: Internal Medicine

## 2013-07-30 ENCOUNTER — Telehealth: Payer: Self-pay | Admitting: *Deleted

## 2013-07-30 ENCOUNTER — Ambulatory Visit (HOSPITAL_BASED_OUTPATIENT_CLINIC_OR_DEPARTMENT_OTHER): Payer: Medicare Other

## 2013-07-30 VITALS — BP 129/78 | HR 86 | Temp 97.8°F | Resp 18

## 2013-07-30 DIAGNOSIS — C9000 Multiple myeloma not having achieved remission: Secondary | ICD-10-CM

## 2013-07-30 DIAGNOSIS — Z5112 Encounter for antineoplastic immunotherapy: Secondary | ICD-10-CM

## 2013-07-30 MED ORDER — SODIUM CHLORIDE 0.9 % IV SOLN
Freq: Once | INTRAVENOUS | Status: AC
  Administered 2013-07-30: 14:00:00 via INTRAVENOUS

## 2013-07-30 MED ORDER — DEXAMETHASONE SODIUM PHOSPHATE 10 MG/ML IJ SOLN
INTRAMUSCULAR | Status: AC
  Filled 2013-07-30: qty 1

## 2013-07-30 MED ORDER — HEPARIN SOD (PORK) LOCK FLUSH 100 UNIT/ML IV SOLN
500.0000 [IU] | Freq: Once | INTRAVENOUS | Status: AC | PRN
  Administered 2013-07-30: 500 [IU]
  Filled 2013-07-30: qty 5

## 2013-07-30 MED ORDER — ONDANSETRON 8 MG/NS 50 ML IVPB
INTRAVENOUS | Status: AC
  Filled 2013-07-30: qty 8

## 2013-07-30 MED ORDER — SODIUM CHLORIDE 0.9 % IJ SOLN
10.0000 mL | INTRAMUSCULAR | Status: DC | PRN
  Administered 2013-07-30: 10 mL
  Filled 2013-07-30: qty 10

## 2013-07-30 MED ORDER — DEXAMETHASONE SODIUM PHOSPHATE 10 MG/ML IJ SOLN
10.0000 mg | Freq: Once | INTRAMUSCULAR | Status: AC
  Administered 2013-07-30: 10 mg via INTRAVENOUS

## 2013-07-30 MED ORDER — SODIUM CHLORIDE 0.9 % IV SOLN: Freq: Once | INTRAVENOUS | Status: DC

## 2013-07-30 MED ORDER — ONDANSETRON 8 MG/50ML IVPB (CHCC)
8.0000 mg | Freq: Once | INTRAVENOUS | Status: AC
  Administered 2013-07-30: 8 mg via INTRAVENOUS

## 2013-07-30 MED ORDER — DEXTROSE 5 % IV SOLN
36.0000 mg/m2 | Freq: Once | INTRAVENOUS | Status: AC
  Administered 2013-07-30: 76 mg via INTRAVENOUS
  Filled 2013-07-30: qty 38

## 2013-07-30 NOTE — Telephone Encounter (Signed)
Per staff message and POF I have scheduled appts.  JMW  

## 2013-07-30 NOTE — Telephone Encounter (Signed)
[facility] lab work by Dr. Loel Lofty given to Dr Vista Mink to review.  SLJ

## 2013-07-30 NOTE — Telephone Encounter (Signed)
Pt aware of appt on 08/15/13 lab,md and Zometa

## 2013-07-31 ENCOUNTER — Ambulatory Visit
Admission: RE | Admit: 2013-07-31 | Discharge: 2013-07-31 | Disposition: A | Discharge status: Home / Self Care | Payer: Medicare Other | Source: Ambulatory Visit | Attending: Urology | Admitting: Urology

## 2013-07-31 DIAGNOSIS — E349 Endocrine disorder, unspecified: Secondary | ICD-10-CM

## 2013-07-31 MED ORDER — GADOBENATE DIMEGLUMINE 529 MG/ML IV SOLN
10.0000 mL | Freq: Once | INTRAVENOUS | Status: AC | PRN
  Administered 2013-07-31: 10 mL via INTRAVENOUS

## 2013-08-15 ENCOUNTER — Telehealth: Payer: Self-pay | Admitting: Internal Medicine

## 2013-08-15 ENCOUNTER — Ambulatory Visit (HOSPITAL_BASED_OUTPATIENT_CLINIC_OR_DEPARTMENT_OTHER): Payer: Medicare Other | Admitting: Internal Medicine

## 2013-08-15 ENCOUNTER — Other Ambulatory Visit: Payer: Self-pay | Admitting: Medical Oncology

## 2013-08-15 ENCOUNTER — Other Ambulatory Visit: Payer: Self-pay | Admitting: *Deleted

## 2013-08-15 ENCOUNTER — Other Ambulatory Visit (HOSPITAL_BASED_OUTPATIENT_CLINIC_OR_DEPARTMENT_OTHER): Payer: Medicare Other

## 2013-08-15 ENCOUNTER — Telehealth: Payer: Self-pay | Admitting: *Deleted

## 2013-08-15 ENCOUNTER — Ambulatory Visit: Payer: Medicare Other | Admitting: Internal Medicine

## 2013-08-15 ENCOUNTER — Encounter: Payer: Self-pay | Admitting: Internal Medicine

## 2013-08-15 ENCOUNTER — Ambulatory Visit (HOSPITAL_BASED_OUTPATIENT_CLINIC_OR_DEPARTMENT_OTHER): Payer: Medicare Other

## 2013-08-15 VITALS — BP 129/74 | HR 81 | Temp 97.3°F | Resp 18 | Ht 69.0 in | Wt 211.3 lb

## 2013-08-15 DIAGNOSIS — C9000 Multiple myeloma not having achieved remission: Secondary | ICD-10-CM

## 2013-08-15 DIAGNOSIS — C9001 Multiple myeloma in remission: Secondary | ICD-10-CM

## 2013-08-15 LAB — COMPREHENSIVE METABOLIC PANEL (CC13)
ALT: 22 U/L (ref 0–55)
ANION GAP: 9 meq/L (ref 3–11)
AST: 20 U/L (ref 5–34)
Albumin: 3.8 g/dL (ref 3.5–5.0)
Alkaline Phosphatase: 60 U/L (ref 40–150)
BILIRUBIN TOTAL: 0.84 mg/dL (ref 0.20–1.20)
BUN: 14.3 mg/dL (ref 7.0–26.0)
CO2: 25 mEq/L (ref 22–29)
CREATININE: 0.9 mg/dL (ref 0.7–1.3)
Calcium: 9.7 mg/dL (ref 8.4–10.4)
Chloride: 107 mEq/L (ref 98–109)
Glucose: 88 mg/dl (ref 70–140)
Potassium: 3.7 mEq/L (ref 3.5–5.1)
Sodium: 142 mEq/L (ref 136–145)
Total Protein: 6.3 g/dL — ABNORMAL LOW (ref 6.4–8.3)

## 2013-08-15 LAB — CBC WITH DIFFERENTIAL/PLATELET
BASO%: 1.3 % (ref 0.0–2.0)
BASOS ABS: 0.1 10*3/uL (ref 0.0–0.1)
EOS%: 4.6 % (ref 0.0–7.0)
Eosinophils Absolute: 0.2 10*3/uL (ref 0.0–0.5)
HEMATOCRIT: 35.4 % — AB (ref 38.4–49.9)
HGB: 12.4 g/dL — ABNORMAL LOW (ref 13.0–17.1)
LYMPH%: 6.7 % — AB (ref 14.0–49.0)
MCH: 34.2 pg — AB (ref 27.2–33.4)
MCHC: 35 g/dL (ref 32.0–36.0)
MCV: 97.7 fL (ref 79.3–98.0)
MONO#: 0.6 10*3/uL (ref 0.1–0.9)
MONO%: 13.1 % (ref 0.0–14.0)
NEUT#: 3.5 10*3/uL (ref 1.5–6.5)
NEUT%: 74.3 % (ref 39.0–75.0)
PLATELETS: 187 10*3/uL (ref 140–400)
RBC: 3.63 10*6/uL — ABNORMAL LOW (ref 4.20–5.82)
RDW: 15.9 % — ABNORMAL HIGH (ref 11.0–14.6)
WBC: 4.6 10*3/uL (ref 4.0–10.3)
lymph#: 0.3 10*3/uL — ABNORMAL LOW (ref 0.9–3.3)

## 2013-08-15 MED ORDER — SODIUM CHLORIDE 0.9 % IV SOLN
Freq: Once | INTRAVENOUS | Status: AC
  Administered 2013-08-15: 10:00:00 via INTRAVENOUS

## 2013-08-15 MED ORDER — HEPARIN SOD (PORK) LOCK FLUSH 100 UNIT/ML IV SOLN
500.0000 [IU] | Freq: Once | INTRAVENOUS | Status: AC | PRN
  Administered 2013-08-15: 500 [IU]
  Filled 2013-08-15: qty 5

## 2013-08-15 MED ORDER — HEPARIN SOD (PORK) LOCK FLUSH 100 UNIT/ML IV SOLN
250.0000 [IU] | Freq: Once | INTRAVENOUS | Status: DC | PRN
  Filled 2013-08-15: qty 5

## 2013-08-15 MED ORDER — SODIUM CHLORIDE 0.9 % IJ SOLN
10.0000 mL | INTRAMUSCULAR | Status: DC | PRN
  Administered 2013-08-15: 10 mL
  Filled 2013-08-15: qty 10

## 2013-08-15 MED ORDER — LENALIDOMIDE 10 MG PO CAPS
10.0000 mg | ORAL_CAPSULE | Freq: Every day | ORAL | Status: DC
Start: 2013-08-15 — End: 2013-09-04

## 2013-08-15 MED ORDER — ZOLEDRONIC ACID 4 MG/100ML IV SOLN
4.0000 mg | Freq: Once | INTRAVENOUS | Status: AC
  Administered 2013-08-15: 4 mg via INTRAVENOUS
  Filled 2013-08-15: qty 100

## 2013-08-15 NOTE — Telephone Encounter (Signed)
Per staff phone call and POF I have schedueld appts.  JMW  

## 2013-08-15 NOTE — Patient Instructions (Signed)

## 2013-08-15 NOTE — Telephone Encounter (Signed)
pt called and r.s. appt done

## 2013-08-15 NOTE — Telephone Encounter (Signed)
Pt aware of appts , he will get it on my chart

## 2013-08-15 NOTE — Telephone Encounter (Signed)
[facility] received revlimid rx and will call pt .

## 2013-08-17 NOTE — Progress Notes (Signed)
[place] [place] Telephone:(336) [phone]   Fax:(336) [phone]  OFFICE PROGRESS NOTE  [name],[name], MD [place] [address]  DIAGNOSIS: Stage IIA multiple myeloma diagnosed in July of 2013   PRIOR THERAPY:  1) Systemic chemotherapy with subcutaneous Velcade 1.3 mg/M2 on days 1, 4, 8 and 11 every 3 weeks in addition to Revlimid 25 mg by mouth daily for 14 days every 3 weeks and dexamethasone 40 mg on a weekly basis. Status post 4 planned cycles.  2) Systemic chemotherapy with subcutaneous Velcade 1.3 mg/M2 on days 1, 4, 8 and 11 every 3 weeks in addition to Revlimid 25 mg by mouth daily for 14 days every 3 weeks and dexamethasone 40 mg on a weekly basis. 4 more cycles are planned and he is status post 4 cycles of the second set of 4 cycles, now status post a total of 10 cycles. 3) Systemic chemotherapy with Carfilzomib 36 mg/M2 on days 1, 2, 8, 9, 15 and 16 in addition to cyclophosphamide 300 mg/M2 IV on days 1, 8 and 15 as well as dexamethasone on days 1, 8, 15 and 22 every 4 weeks. Status post 4 cycles. 4) Zometa 4 mg IV every month. 5) autologous peripheral blood stem cell transplant on 03/20/2013 at [facility]. 6) Consolidation chemotherapy with Carfilzomib 36 mg/M2 on days 1, 2, 8, 9, 15 and 16 every 4 weeks in addition to cyclophosphamide 300 mg/M2 on days 1, 8 and 15 as well as dexamethasone 40 mg on a weekly basis every 4 weeks. Status post 2 cycles. First cycle 06/11/2013.    CURRENT THERAPY:  1) maintenance therapy with Revlimid 10 mg by mouth daily expected to start in the next few days. 2) Zometa 4 mg IV every 2 months.   INTERVAL HISTORY: [name] 73 y.o. male returns to the clinic today for followup visit. He recently underwent autologous peripheral blood stem cell transplant at [facility] on 03/20/2013. This was followed by 2 cycles of consolidation chemotherapy with Carfilzomib, dexamethasone and cyclophosphamide. He  tolerated his treatment fairly well with no significant adverse effects. He denied having any significant fatigue or weakness. He denied having any fever or chills. He has no nausea or vomiting.  He denied having any significant chest pain, shortness of breath, cough or hemoptysis. He is here today for evaluation and discussion of maintenance treatment.  MEDICAL HISTORY: Past Medical History  Diagnosis Date  . GERD (gastroesophageal reflux disease)   . Adenomatous polyp of colon 1996  . Arthritis     Osteoarthritis right knee, spine, wrist  . Benign prostatic hypertrophy     (Dr. Karsten Ro)  . History of chronic prostatitis   . History of melanoma     Back (Dr. Wilhemina Bonito); early melanoma R arm 03/2011  . Diverticulosis   . Arthritis of hand, right     Right thumb (Dr. Daylene Katayama)  . Degenerative disc disease   . Hemorrhoids   . Erosive esophagitis   . Back ache     r/o Multiple Myeloma  . Multiple myeloma 2013    ALLERGIES:  is allergic to penicillins.  MEDICATIONS:  Current Outpatient Prescriptions  Medication Sig Dispense Refill  . Alpha-Lipoic Acid (LIPOIC ACID PO) Take 2 each by mouth 2 (two) times daily.      Marland Kitchen ascorbic acid (VITAMIN C) 500 MG tablet Take 500 mg by mouth daily.      Marland Kitchen aspirin EC 81 MG tablet  Take 81 mg by mouth every other day.      . B Complex-C (B-COMPLEX WITH VITAMIN C) tablet Take 1 tablet by mouth daily.      Marland Kitchen BESIVANCE 0.6 % SUSP       . cholecalciferol (VITAMIN D) 1000 UNITS tablet Take 1,000 Units by mouth daily.      . DUREZOL 0.05 % EMUL       . fish oil-omega-3 fatty acids 1000 MG capsule Take 1 g by mouth daily.       Marland Kitchen HYDROcodone-acetaminophen (NORCO) 7.5-325 MG per tablet Take 1 tablet by mouth every 6 (six) hours as needed.  30 tablet  0  . lidocaine-prilocaine (EMLA) cream Apply topically as needed. Apply to the port 1 hour before chemo  30 g  0  . Multiple Vitamin (MULTIVITAMIN WITH MINERALS) TABS Take 1 tablet by mouth daily.      Marland Kitchen  oxycodone (OXY-IR) 5 MG capsule Take 5 mg by mouth every 6 (six) hours as needed for pain.      . pantoprazole (PROTONIX) 40 MG tablet Take 40 mg by mouth. Take 1 tablet (40 mg total) by mouth Two (2) times a day.      . Probiotic Product (ALIGN) 4 MG CAPS Take 1 capsule by mouth daily.      Marland Kitchen PROLENSA 0.07 % SOLN       . temazepam (RESTORIL) 15 MG capsule Take 1 capsule (15 mg total) by mouth at bedtime. Take 1 capsule (15 mg total) by mouth nightly as needed for sleep.  30 capsule  0  . valACYclovir (VALTREX) 500 MG tablet Take 500 mg by mouth. Take 1 tablet (500 mg total) by mouth daily.      Marland Kitchen zolendronic acid (ZOMETA) 4 MG/5ML injection Inject 4 mg into the vein every 28 (twenty-eight) days.      Marland Kitchen acetaminophen (TYLENOL) 325 MG tablet Take 650 mg by mouth. prn      . bacitracin ointment Apply topically. Apply as directed      . celecoxib (CELEBREX) 200 MG capsule Take 200 mg by mouth daily as needed for pain.      . clonazePAM (KLONOPIN) 0.5 MG tablet Take 0.5 mg by mouth. Take 1 tablet (0.5 mg total) by mouth Two (2) times a day.      . lenalidomide (REVLIMID) 10 MG capsule Take 1 capsule (10 mg total) by mouth daily.  28 capsule  0  . ondansetron (ZOFRAN-ODT) 4 MG disintegrating tablet Take 4 mg by mouth every 12 (twelve) hours as needed for nausea.      Marland Kitchen PRESCRIPTION MEDICATION Inject into the vein every 21 ( twenty-one) days. Kyprolis and Cytoxan infusion every 3 weeks (Monday and Tuesday)      . prochlorperazine (COMPAZINE) 10 MG tablet 10 mg every 6 (six) hours as needed.        No current facility-administered medications for this visit.   Facility-Administered Medications Ordered in Other Visits  Medication Dose Route Frequency Provider Last Rate Last Dose  . ondansetron (ZOFRAN) tablet 8 mg  8 mg Oral Once Curt Bears, MD      . sodium chloride 0.9 % injection 10 mL  10 mL Intracatheter PRN Curt Bears, MD   10 mL at 06/11/13 1616    SURGICAL HISTORY:  Past Surgical  History  Procedure Laterality Date  . Total knee replaced  2000    Left knee  . Appendectomy    . Colonoscopy  2009  .  Esophagogastroduodenoscopy  2009    mild inflammation at esophagogastric junction  . Excision of melanoma      back ( x3)  . Great toe surgery      bilateral  . Inguinal hernia repair      left, x 2  . Total knee arthroplasty  06/08/2011    Procedure: TOTAL KNEE ARTHROPLASTY;  Surgeon: Ninetta Lights, MD;  Location: [facility];  Service: Orthopedics;  Laterality: Right;  osteonics  . [place]  10/2012    REVIEW OF SYSTEMS:  Constitutional: negative Eyes: negative Ears, nose, mouth, throat, and face: negative Respiratory: negative Cardiovascular: negative Gastrointestinal: negative Genitourinary:negative Integument/breast: negative Hematologic/lymphatic: negative Musculoskeletal:negative Neurological: negative Behavioral/Psych: negative Endocrine: negative Allergic/Immunologic: negative   PHYSICAL EXAMINATION: General appearance: alert, cooperative and no distress Head: Normocephalic, without obvious abnormality, atraumatic Neck: no adenopathy, no JVD, supple, symmetrical, trachea midline and thyroid not enlarged, symmetric, no tenderness/mass/nodules Lymph nodes: Cervical, supraclavicular, and axillary nodes normal. Resp: clear to auscultation bilaterally Back: symmetric, no curvature. ROM normal. No CVA tenderness. Cardio: regular rate and rhythm, S1, S2 normal, no murmur, click, rub or gallop GI: soft, non-tender; bowel sounds normal; no masses,  no organomegaly Extremities: extremities normal, atraumatic, no cyanosis or edema Neurologic: Alert and oriented X 3, normal strength and tone. Normal symmetric reflexes. Normal coordination and gait  ECOG PERFORMANCE STATUS: 1 - Symptomatic but completely ambulatory  Blood pressure 129/74, pulse 81, temperature 97.3 F (36.3 C), temperature source Oral, resp. rate 18, height $RemoveBe'5\' 9"'[id number]$  (1.753 m), weight 211 lb  4.8 oz (95.845 kg), SpO2 96.00%.  LABORATORY DATA: Lab Results  Component Value Date   WBC 4.6 08/15/2013   HGB 12.4* 08/15/2013   HCT 35.4* 08/15/2013   MCV 97.7 08/15/2013   PLT 187 08/15/2013      Chemistry      Component Value Date/Time   NA 142 08/15/2013 0828   NA 138 01/29/2013 0200   K 3.7 08/15/2013 0828   K 3.4* 01/29/2013 0200   CL 104 01/29/2013 0200   CL 109* 12/31/2012 0759   CO2 25 08/15/2013 0828   CO2 28 01/29/2013 0200   BUN 14.3 08/15/2013 0828   BUN 18 01/29/2013 0200   CREATININE 0.9 08/15/2013 0828   CREATININE 1.10 01/29/2013 0200   CREATININE 0.98 01/18/2012 0934      Component Value Date/Time   CALCIUM 9.7 08/15/2013 0828   CALCIUM 9.0 01/29/2013 0200   ALKPHOS 60 08/15/2013 0828   ALKPHOS 50 01/29/2013 0200   AST 20 08/15/2013 0828   AST 18 01/29/2013 0200   ALT 22 08/15/2013 0828   ALT 16 01/29/2013 0200   BILITOT 0.84 08/15/2013 0828   BILITOT 0.4 01/29/2013 0200       RADIOGRAPHIC STUDIES: No results found.  ASSESSMENT AND PLAN: This is a very pleasant 73 years old white male with history of multiple myeloma status post induction chemotherapy with Velcade, Revlimid and dexamethasone followed by treatment with Carfilzomib, cyclophosphamide and dexamethasone with significant improvement in his disease followed by autologous peripheral blood stem cell transplant at [facility] on 03/20/2013. This was followed by 2 cycles of consolidation chemotherapy with Carfilzomib, Cytoxan and dexamethasone, and he tolerated it fairly well. I have a lengthy discussion with the patient today about maintenance treatment for multiple myeloma with single agent Revlimid. I discussed with the patient adverse effect of this treatment including but not limited to myelosuppression, nausea and vomiting, peripheral neuropathy, liver or renal dysfunction as well as increased  risk of secondary malignancy. The patient understand the risk of this treatment and he would like to proceed with treatment as  planned. I will start him initially on Revlimid 10 mg by mouth daily for 2 months and this will be followed by increase of the dose to 15 mg by mouth daily if tolerated well. He will continue treatment with Zometa every 2 months. He would come back for followup visit in 4 weeks for reevaluation and management any adverse effect of his treatment. He was advised to call immediately if he has any concerning symptoms in the interval. The patient voices understanding of current disease status and treatment options and is in agreement with the current care plan.  All questions were answered. The patient knows to call the clinic with any problems, questions or concerns. We can certainly see the patient much sooner if necessary.  I spent 15 minutes counseling the patient face to face. The total time spent in the appointment was 25 minutes.

## 2013-08-17 NOTE — Patient Instructions (Signed)
We discussed maintenance treatment with Revlimid today. Followup visit in one month.

## 2013-08-20 ENCOUNTER — Telehealth: Payer: Self-pay | Admitting: *Deleted

## 2013-08-20 DIAGNOSIS — C9000 Multiple myeloma not having achieved remission: Secondary | ICD-10-CM

## 2013-08-20 MED ORDER — WARFARIN SODIUM 2 MG PO TABS: 2.0000 mg | ORAL_TABLET | Freq: Every day | ORAL | Status: DC

## 2013-08-20 NOTE — Telephone Encounter (Signed)
Pt wanted to see if he should begin taking coumadin now that is is on revlimid.  Per Dr Vista Mink, okay to begin coumadin 2mg  tablets daily.  SLJ

## 2013-08-26 ENCOUNTER — Telehealth: Payer: Self-pay | Admitting: Medical Oncology

## 2013-08-26 ENCOUNTER — Encounter: Payer: Self-pay | Admitting: Internal Medicine

## 2013-08-26 NOTE — Telephone Encounter (Signed)
Prior auth form for revlimid given to [employer]

## 2013-08-26 NOTE — Progress Notes (Signed)
Faxed pa form to [facility] for revlimid

## 2013-08-28 ENCOUNTER — Encounter: Payer: Self-pay | Admitting: Internal Medicine

## 2013-08-28 NOTE — Progress Notes (Signed)
[facility], [id number], approved revlimid 10mg  from 08/26/13-08/26/14

## 2013-08-29 ENCOUNTER — Telehealth: Payer: Self-pay | Admitting: *Deleted

## 2013-08-29 MED ORDER — GABAPENTIN 100 MG PO CAPS: 100.0000 mg | ORAL_CAPSULE | Freq: Three times a day (TID) | ORAL | Status: DC

## 2013-08-29 NOTE — Addendum Note (Signed)
Addended by: Britt Bottom on: 08/29/2013 04:59 PM   Modules accepted: Orders

## 2013-08-29 NOTE — Telephone Encounter (Signed)
Pt called stating that he has increased numbness and tingling in his feet and left hand.  He states it is "bothersome".  Per Dr Vista Mink, if pt would like he can start a rx to help with the neuropathy.  Pt states he would like that.  Will discuss with Dr Vista Mink what rx he wants to give pt and call it into [facility] on [address].  SLJ

## 2013-09-03 ENCOUNTER — Telehealth: Payer: Self-pay | Admitting: Medical Oncology

## 2013-09-03 NOTE — Telephone Encounter (Signed)
I left a message for pharmacy advocate to call me back about refill requested by jeff for revlimid.

## 2013-09-03 NOTE — Telephone Encounter (Signed)
I asked pt to call [facility] for refill. When I go on [employer] website there is no prompt to do provider survey or auth number needed.

## 2013-09-04 ENCOUNTER — Other Ambulatory Visit: Payer: Self-pay | Admitting: *Deleted

## 2013-09-04 ENCOUNTER — Other Ambulatory Visit: Payer: Self-pay | Admitting: Internal Medicine

## 2013-09-04 DIAGNOSIS — C9000 Multiple myeloma not having achieved remission: Secondary | ICD-10-CM

## 2013-09-04 MED ORDER — LENALIDOMIDE 10 MG PO CAPS: 10.0000 mg | ORAL_CAPSULE | Freq: Every day | ORAL | Status: DC

## 2013-09-10 ENCOUNTER — Other Ambulatory Visit: Payer: Self-pay | Admitting: *Deleted

## 2013-09-10 ENCOUNTER — Telehealth: Payer: Self-pay | Admitting: Medical Oncology

## 2013-09-10 NOTE — Telephone Encounter (Signed)
[name] reports that last week his face and scalp became  and with blisters . He stated his face looks " wind burned". He denies med changes. Next appt 3/11.

## 2013-09-10 NOTE — Telephone Encounter (Signed)
Received refill request from [facility] for Revlimid 10mg .  Called [employer] and was informed that last authorization was issued on 09/05/13.  Pt can have a new authorization issued on  09/26/13 ( too early for refill today 09/09/13. ).

## 2013-09-12 NOTE — Telephone Encounter (Signed)
Pt called stating that his rash/hives, dryness is still continuing and he wants to know whether he should hold his medications.  Pt is out of town, Per AJ, pt advised to take claritan or benadryl and pt will be evaluated at next f/u on 09/18/13, pt verbalized understanding.  SLJ

## 2013-09-18 ENCOUNTER — Encounter: Payer: Self-pay | Admitting: Physician Assistant

## 2013-09-18 ENCOUNTER — Ambulatory Visit (HOSPITAL_BASED_OUTPATIENT_CLINIC_OR_DEPARTMENT_OTHER): Payer: Medicare Other | Admitting: Physician Assistant

## 2013-09-18 ENCOUNTER — Telehealth: Payer: Self-pay | Admitting: Internal Medicine

## 2013-09-18 ENCOUNTER — Other Ambulatory Visit (HOSPITAL_BASED_OUTPATIENT_CLINIC_OR_DEPARTMENT_OTHER): Payer: Medicare Other

## 2013-09-18 VITALS — BP 120/62 | HR 63 | Temp 98.1°F | Resp 18 | Ht 69.0 in | Wt 212.7 lb

## 2013-09-18 DIAGNOSIS — R0989 Other specified symptoms and signs involving the circulatory and respiratory systems: Secondary | ICD-10-CM

## 2013-09-18 DIAGNOSIS — C9000 Multiple myeloma not having achieved remission: Secondary | ICD-10-CM

## 2013-09-18 DIAGNOSIS — R0609 Other forms of dyspnea: Secondary | ICD-10-CM

## 2013-09-18 LAB — CBC WITH DIFFERENTIAL/PLATELET
BASO%: 4.6 % — ABNORMAL HIGH (ref 0.0–2.0)
Basophils Absolute: 0.2 10*3/uL — ABNORMAL HIGH (ref 0.0–0.1)
EOS%: 13.6 % — AB (ref 0.0–7.0)
Eosinophils Absolute: 0.5 10*3/uL (ref 0.0–0.5)
HCT: 37.5 % — ABNORMAL LOW (ref 38.4–49.9)
HGB: 12.6 g/dL — ABNORMAL LOW (ref 13.0–17.1)
LYMPH%: 16.8 % (ref 14.0–49.0)
MCH: 33.5 pg — AB (ref 27.2–33.4)
MCHC: 33.6 g/dL (ref 32.0–36.0)
MCV: 99.7 fL — AB (ref 79.3–98.0)
MONO#: 0.4 10*3/uL (ref 0.1–0.9)
MONO%: 10.6 % (ref 0.0–14.0)
NEUT%: 54.4 % (ref 39.0–75.0)
NEUTROS ABS: 2.1 10*3/uL (ref 1.5–6.5)
Platelets: 182 10*3/uL (ref 140–400)
RBC: 3.76 10*6/uL — AB (ref 4.20–5.82)
RDW: 13.2 % (ref 11.0–14.6)
WBC: 3.8 10*3/uL — ABNORMAL LOW (ref 4.0–10.3)
lymph#: 0.6 10*3/uL — ABNORMAL LOW (ref 0.9–3.3)

## 2013-09-18 LAB — COMPREHENSIVE METABOLIC PANEL (CC13)
ALK PHOS: 57 U/L (ref 40–150)
ALT: 16 U/L (ref 0–55)
AST: 16 U/L (ref 5–34)
Albumin: 3.6 g/dL (ref 3.5–5.0)
Anion Gap: 10 mEq/L (ref 3–11)
BUN: 10.3 mg/dL (ref 7.0–26.0)
CO2: 24 mEq/L (ref 22–29)
Calcium: 9.2 mg/dL (ref 8.4–10.4)
Chloride: 108 mEq/L (ref 98–109)
Creatinine: 0.9 mg/dL (ref 0.7–1.3)
Glucose: 104 mg/dl (ref 70–140)
Potassium: 3.9 mEq/L (ref 3.5–5.1)
SODIUM: 142 meq/L (ref 136–145)
TOTAL PROTEIN: 6.3 g/dL — AB (ref 6.4–8.3)
Total Bilirubin: 0.43 mg/dL (ref 0.20–1.20)

## 2013-09-18 LAB — LACTATE DEHYDROGENASE (CC13): LDH: 153 U/L (ref 125–245)

## 2013-09-18 NOTE — Patient Instructions (Signed)
Use topical emollients to the rash and dry skin areas Continue taking Revlimid 10 mg by mouth daily Followup in one month

## 2013-09-18 NOTE — Telephone Encounter (Signed)
gv adn pritned appt sched and avs for pt for April thru June....Marland Kitchensed added tx.

## 2013-09-18 NOTE — Progress Notes (Addendum)
[facility] [phone]   [phone]  OFFICE PROGRESS NOTE  [name],[name], MD [place] [address]  DIAGNOSIS: Stage IIA multiple myeloma diagnosed in July of 2013   PRIOR THERAPY:  1) Systemic chemotherapy with subcutaneous Velcade 1.3 mg/M2 on days 1, 4, 8 and 11 every 3 weeks in addition to Revlimid 25 mg by mouth daily for 14 days every 3 weeks and dexamethasone 40 mg on a weekly basis. Status post 4 planned cycles.  2) Systemic chemotherapy with subcutaneous Velcade 1.3 mg/M2 on days 1, 4, 8 and 11 every 3 weeks in addition to Revlimid 25 mg by mouth daily for 14 days every 3 weeks and dexamethasone 40 mg on a weekly basis. 4 more cycles are planned and he is status post 4 cycles of the second set of 4 cycles, now status post a total of 10 cycles. 3) Systemic chemotherapy with Carfilzomib 36 mg/M2 on days 1, 2, 8, 9, 15 and 16 in addition to cyclophosphamide 300 mg/M2 IV on days 1, 8 and 15 as well as dexamethasone on days 1, 8, 15 and 22 every 4 weeks. Status post 4 cycles. 4) Zometa 4 mg IV every month. 5) autologous peripheral blood stem cell transplant on 03/20/2013 at [facility]. 6) Consolidation chemotherapy with Carfilzomib 36 mg/M2 on days 1, 2, 8, 9, 15 and 16 every 4 weeks in addition to cyclophosphamide 300 mg/M2 on days 1, 8 and 15 as well as dexamethasone 40 mg on a weekly basis every 4 weeks. Status post 2 cycles. First cycle 06/11/2013.    CURRENT THERAPY:  1) maintenance therapy with Revlimid 10 mg by mouth daily. Status post 1 month of therapy 2) Zometa 4 mg IV every 2 months.   INTERVAL HISTORY: [name] 73 y.o. male returns to the clinic today for followup visit. He recently underwent autologous peripheral blood stem cell transplant at [facility] on 03/20/2013. This was followed by 2 cycles of consolidation chemotherapy with Carfilzomib, dexamethasone and cyclophosphamide. He tolerated  his treatment fairly well with no significant adverse effects. He is tolerating the Revlimid relatively well the exception of the development of a erythematous dry rash on the face, primarily the nose and cheek area. He also has some dry itchy scalp issues since starting the daily Revlimid at 10 mg by mouth daily. He reports that he was told to discontinue the prophylactic acyclovir her per the physicians in [facility]. He reports continued shortness of breath with exertion, like walking up hills. He continues on warfarin as prescribed. He voiced no other specific complaints today. He denied having any significant fatigue or weakness. He denied having any fever or chills. He has no nausea or vomiting.  He denied having any significant chest pain, shortness of breath, cough or hemoptysis. He is here today for a symptom management visit regarding his  maintenance treatment.  MEDICAL HISTORY: Past Medical History  Diagnosis Date  . GERD (gastroesophageal reflux disease)   . Adenomatous polyp of colon 1996  . Arthritis     Osteoarthritis right knee, spine, wrist  . Benign prostatic hypertrophy     (Dr. Karsten Ro)  . History of chronic prostatitis   . History of melanoma     Back (Dr. Wilhemina Bonito); early melanoma R arm 03/2011  . Diverticulosis   . Arthritis of hand, right     Right thumb (Dr. Daylene Katayama)  . Degenerative disc disease   .  Hemorrhoids   . Erosive esophagitis   . Back ache     r/o Multiple Myeloma  . Multiple myeloma 2013    ALLERGIES:  is allergic to penicillins.  MEDICATIONS:  Current Outpatient Prescriptions  Medication Sig Dispense Refill  . ascorbic acid (VITAMIN C) 500 MG tablet Take 500 mg by mouth daily.      . B Complex-C (B-COMPLEX WITH VITAMIN C) tablet Take 1 tablet by mouth daily.      . celecoxib (CELEBREX) 200 MG capsule Take 200 mg by mouth daily as needed for pain.      . cholecalciferol (VITAMIN D) 1000 UNITS tablet Take 1,000 Units by mouth daily.      . fish  oil-omega-3 fatty acids 1000 MG capsule Take 1 g by mouth daily.       Marland Kitchen HYDROcodone-acetaminophen (NORCO) 7.5-325 MG per tablet Take 1 tablet by mouth every 6 (six) hours as needed.  30 tablet  0  . lenalidomide (REVLIMID) 10 MG capsule Take 1 capsule (10 mg total) by mouth daily.  28 capsule  0  . Multiple Vitamin (MULTIVITAMIN WITH MINERALS) TABS Take 1 tablet by mouth daily.      . ondansetron (ZOFRAN-ODT) 4 MG disintegrating tablet Take 4 mg by mouth every 12 (twelve) hours as needed for nausea.      . pantoprazole (PROTONIX) 40 MG tablet Take 40 mg by mouth. Take 1 tablet (40 mg total) by mouth Two (2) times a day.      Marland Kitchen PRESCRIPTION MEDICATION Inject into the vein every 21 ( twenty-one) days. Kyprolis and Cytoxan infusion every 3 weeks (Monday and Tuesday)      . Probiotic Product (ALIGN) 4 MG CAPS Take 1 capsule by mouth daily.      . prochlorperazine (COMPAZINE) 10 MG tablet 10 mg every 6 (six) hours as needed.       . warfarin (COUMADIN) 2 MG tablet Take 1 tablet (2 mg total) by mouth daily.  30 tablet  1  . zolendronic acid (ZOMETA) 4 MG/5ML injection Inject 4 mg into the vein every 28 (twenty-eight) days.      Marland Kitchen acetaminophen (TYLENOL) 325 MG tablet Take 650 mg by mouth. prn      . Alpha-Lipoic Acid (LIPOIC ACID PO) Take 2 each by mouth 2 (two) times daily.      Marland Kitchen aspirin EC 81 MG tablet Take 81 mg by mouth every other day.      . bacitracin ointment Apply topically. Apply as directed      . BESIVANCE 0.6 % SUSP       . clonazePAM (KLONOPIN) 0.5 MG tablet Take 0.5 mg by mouth. Take 1 tablet (0.5 mg total) by mouth Two (2) times a day.      . DUREZOL 0.05 % EMUL       . gabapentin (NEURONTIN) 100 MG capsule Take 1 capsule (100 mg total) by mouth 3 (three) times daily.  90 capsule  0  . lidocaine-prilocaine (EMLA) cream Apply topically as needed. Apply to the port 1 hour before chemo  30 g  0  . oxycodone (OXY-IR) 5 MG capsule Take 5 mg by mouth every 6 (six) hours as needed for pain.       Marland Kitchen PROLENSA 0.07 % SOLN       . temazepam (RESTORIL) 15 MG capsule Take 1 capsule (15 mg total) by mouth at bedtime. Take 1 capsule (15 mg total) by mouth nightly as needed for sleep.  [place]  capsule  0  . valACYclovir (VALTREX) 500 MG tablet Take 500 mg by mouth. Take 1 tablet (500 mg total) by mouth daily.       No current facility-administered medications for this visit.   Facility-Administered Medications Ordered in Other Visits  Medication Dose Route Frequency Provider Last Rate Last Dose  . ondansetron (ZOFRAN) tablet 8 mg  8 mg Oral Once Curt Bears, MD      . sodium chloride 0.9 % injection 10 mL  10 mL Intracatheter PRN Curt Bears, MD   10 mL at 06/11/13 1616    SURGICAL HISTORY:  Past Surgical History  Procedure Laterality Date  . Total knee replaced  2000    Left knee  . Appendectomy    . Colonoscopy  2009  . Esophagogastroduodenoscopy  2009    mild inflammation at esophagogastric junction  . Excision of melanoma      back ( x3)  . Great toe surgery      bilateral  . Inguinal hernia repair      left, x 2  . Total knee arthroplasty  06/08/2011    Procedure: TOTAL KNEE ARTHROPLASTY;  Surgeon: Ninetta Lights, MD;  Location: [facility];  Service: Orthopedics;  Laterality: Right;  osteonics  . Port [place]emet  10/2012    REVIEW OF SYSTEMS:  Constitutional: negative Eyes: negative Ears, nose, mouth, throat, and face: negative Respiratory: positive for dyspnea on exertion Cardiovascular: negative Gastrointestinal: negative Genitourinary:negative Integument/breast: positive for dryness, pruritus and rash Hematologic/lymphatic: negative Musculoskeletal:negative Neurological: negative Behavioral/Psych: negative Endocrine: negative Allergic/Immunologic: negative   PHYSICAL EXAMINATION: General appearance: alert, cooperative and no distress Head: Normocephalic, without obvious abnormality, atraumatic Neck: no adenopathy, no JVD, supple, symmetrical, trachea midline  and thyroid not enlarged, symmetric, no tenderness/mass/nodules Lymph nodes: Cervical, supraclavicular, and axillary nodes normal. Resp: clear to auscultation bilaterally Back: symmetric, no curvature. ROM normal. No CVA tenderness. Cardio: regular rate and rhythm, S1, S2 normal, no murmur, click, rub or gallop GI: soft, non-tender; bowel sounds normal; no masses,  no organomegaly Extremities: extremities normal, atraumatic, no cyanosis or edema Neurologic: Alert and oriented X 3, normal strength and tone. Normal symmetric reflexes. Normal coordination and gait Skin: Faint erythema and dryness over the cheeks and nose of the face as well as dryness of the skin of the upper extremities  ECOG PERFORMANCE STATUS: 1 - Symptomatic but completely ambulatory  Blood pressure 120/62, pulse 63, temperature 98.1 F (36.7 C), temperature source Oral, resp. rate 18, height _0  (1.753 m), weight 212 lb 11.2 oz (96.48 kg), SpO2 99.00%.  LABORATORY DATA: Lab Results  Component Value Date   WBC 3.8* 09/18/2013   HGB 12.6* 09/18/2013   HCT 37.5* 09/18/2013   MCV 99.7* 09/18/2013   PLT 182 09/18/2013      Chemistry      Component Value Date/Time   NA 142 09/18/2013 0803   NA 138 01/29/2013 0200   K 3.9 09/18/2013 0803   K 3.4* 01/29/2013 0200   CL 104 01/29/2013 0200   CL 109* 12/31/2012 0759   CO2 24 09/18/2013 0803   CO2 28 01/29/2013 0200   BUN 10.3 09/18/2013 0803   BUN 18 01/29/2013 0200   CREATININE 0.9 09/18/2013 0803   CREATININE 1.10 01/29/2013 0200   CREATININE 0.98 01/18/2012 0934      Component Value Date/Time   CALCIUM 9.2 09/18/2013 0803   CALCIUM 9.0 01/29/2013 0200   ALKPHOS 57 09/18/2013 0803   ALKPHOS 50 01/29/2013 0200   AST  16 09/18/2013 0803   AST 18 01/29/2013 0200   ALT 16 09/18/2013 0803   ALT 16 01/29/2013 0200   BILITOT 0.43 09/18/2013 0803   BILITOT 0.4 01/29/2013 0200       RADIOGRAPHIC STUDIES: No results found.  ASSESSMENT AND PLAN: This is a very pleasant 73 years old  white male with history of multiple myeloma status post induction chemotherapy with Velcade, Revlimid and dexamethasone followed by treatment with Carfilzomib, cyclophosphamide and dexamethasone with significant improvement in his disease followed by autologous peripheral blood stem cell transplant at [facility] on 03/20/2013. This was followed by 2 cycles of consolidation chemotherapy with Carfilzomib, Cytoxan and dexamethasone, and he tolerated it fairly well. His current currently being treated with maintenance therapy with Revlimid at 10 mg by mouth daily, status post 1 month of therapy. The patient was discussed with hand also seen by Dr. [name]. The rash that he is experiencing is likely secondary to the Revlimid. Patient was advised to continue the Revlimid at the current dose. You was also advised to use topical  emollients. He was given samples of Vanicream to try. He is also encouraged to use topical emollients to his upper extremities and the rest of his body where he is having skin dry skin issues. He will continue on Revlimid at 10 mg by mouth daily for an additional month. He will followup with Dr. [name] in one month for another symptom management visit. If his counts are stable and he continues to tolerate the daily Revlimid, he will be increased to maintenance Revlimid at 15 mg by mouth daily. When he returns in one month he will be due for his next Zometa infusion and Port-A-Cath flush.  He was advised to call immediately if he has any concerning symptoms in the interval. The patient voices understanding of current disease status and treatment options and is in agreement with the current care plan.  All questions were answered. The patient knows to call the clinic with any problems, questions or concerns. We can certainly see the patient much sooner if necessary.  Carlton Adam PA-C  ADDENDUM: Hematology/Oncology Attending: I had a face to face encounter with the patient  today. I recommended his care plan. This is a very pleasant 73 years old white male with history of multiple myeloma currently on maintenance therapy with Revlimid 10 mg by mouth daily status post 1 months and tolerating his treatment fairly well except for dry skin. I recommended for the patient to continue to with his current dose of Revlimid for one more month. I would consider increasing the dose to 15 mg starting from next month if he continues to tolerate it well. For the dry skin the patient was advised to use topical emollients.  He'll continue his treatment with Zometa every 2 months as scheduled. The patient would come back for followup visit in one month for reevaluation He was advised to call immediately if he has any concerning symptoms in the interval.  Disclaimer: This note was dictated with voice recognition software. Similar sounding words can inadvertently be transcribed and may not be corrected upon review. Eilleen Kempf., MD 09/18/2013

## 2013-09-27 ENCOUNTER — Other Ambulatory Visit: Payer: Self-pay | Admitting: Medical Oncology

## 2013-09-27 DIAGNOSIS — C9000 Multiple myeloma not having achieved remission: Secondary | ICD-10-CM

## 2013-09-27 MED ORDER — LENALIDOMIDE 10 MG PO CAPS: 10.0000 mg | ORAL_CAPSULE | Freq: Every day | ORAL | Status: DC

## 2013-09-27 NOTE — Telephone Encounter (Signed)
I faxed revlimid refill to [facility].

## 2013-10-01 ENCOUNTER — Telehealth: Payer: Self-pay | Admitting: Medical Oncology

## 2013-10-01 ENCOUNTER — Telehealth: Payer: Self-pay

## 2013-10-01 ENCOUNTER — Other Ambulatory Visit: Payer: Self-pay | Admitting: *Deleted

## 2013-10-01 ENCOUNTER — Ambulatory Visit (HOSPITAL_BASED_OUTPATIENT_CLINIC_OR_DEPARTMENT_OTHER): Payer: Medicare Other

## 2013-10-01 ENCOUNTER — Encounter: Payer: Self-pay | Admitting: Gastroenterology

## 2013-10-01 ENCOUNTER — Other Ambulatory Visit: Payer: Self-pay | Admitting: Physician Assistant

## 2013-10-01 ENCOUNTER — Ambulatory Visit (HOSPITAL_COMMUNITY)
Admission: RE | Admit: 2013-10-01 | Discharge: 2013-10-01 | Disposition: A | Discharge status: Home / Self Care | Payer: Medicare Other | Source: Ambulatory Visit | Attending: Physician Assistant | Admitting: Physician Assistant

## 2013-10-01 VITALS — BP 127/64 | HR 68 | Temp 98.2°F

## 2013-10-01 DIAGNOSIS — Z95828 Presence of other vascular implants and grafts: Secondary | ICD-10-CM

## 2013-10-01 DIAGNOSIS — M47814 Spondylosis without myelopathy or radiculopathy, thoracic region: Secondary | ICD-10-CM | POA: Insufficient documentation

## 2013-10-01 DIAGNOSIS — R079 Chest pain, unspecified: Secondary | ICD-10-CM | POA: Insufficient documentation

## 2013-10-01 DIAGNOSIS — Z452 Encounter for adjustment and management of vascular access device: Secondary | ICD-10-CM

## 2013-10-01 DIAGNOSIS — C9000 Multiple myeloma not having achieved remission: Secondary | ICD-10-CM

## 2013-10-01 DIAGNOSIS — R071 Chest pain on breathing: Secondary | ICD-10-CM

## 2013-10-01 MED ORDER — SODIUM CHLORIDE 0.9 % IJ SOLN
10.0000 mL | INTRAMUSCULAR | Status: DC | PRN
  Administered 2013-10-01: 10 mL via INTRAVENOUS
  Filled 2013-10-01: qty 10

## 2013-10-01 MED ORDER — HEPARIN SOD (PORK) LOCK FLUSH 100 UNIT/ML IV SOLN
500.0000 [IU] | Freq: Once | INTRAVENOUS | Status: AC
  Administered 2013-10-01: 500 [IU] via INTRAVENOUS
  Filled 2013-10-01: qty 5

## 2013-10-01 NOTE — Telephone Encounter (Signed)
Pt confimed flush appointment for today.

## 2013-10-01 NOTE — Progress Notes (Addendum)
Patient add on for Port A Cath flush per Abelina Bachelor, RN. Note Patient called earlier and spoke with Abelina Bachelor, RN with complaints of chest discomfort. Port accessed and flushed without any difficulty. Blood return noted. Patient tolerated port flush well. Patient reports continued chest discomfort. No shortness of breath reported at this time. Blood pressure is 127/64. Above reported to Awilda Metro, PA's nurse, and Patient discharged from [place] room.

## 2013-10-01 NOTE — Telephone Encounter (Signed)
Patient called to inform Leamon Arnt that he feels a lot better after he received the flush and that the nurse working with Awilda Metro came to see him to get an Xray and he just wanted to say thank you.

## 2013-10-01 NOTE — Patient Instructions (Signed)

## 2013-10-01 NOTE — Telephone Encounter (Signed)
Pt reports pain around port a cath. I called back and offered flush appointment today at 1430 . I told him to call back to confirm . Flush RN notified to contact Awilda Metro for any  port problems.

## 2013-10-02 ENCOUNTER — Telehealth: Payer: Self-pay | Admitting: *Deleted

## 2013-10-02 NOTE — Telephone Encounter (Signed)
Left message for patient regarding negative Xray results.  Patient is good to go to [place].  Per Awilda Metro.

## 2013-10-08 ENCOUNTER — Other Ambulatory Visit: Payer: Self-pay | Admitting: *Deleted

## 2013-10-08 DIAGNOSIS — C9000 Multiple myeloma not having achieved remission: Secondary | ICD-10-CM

## 2013-10-08 MED ORDER — WARFARIN SODIUM 2 MG PO TABS: 2.0000 mg | ORAL_TABLET | Freq: Every day | ORAL | Status: DC

## 2013-10-10 ENCOUNTER — Ambulatory Visit: Payer: Medicare Other

## 2013-10-10 ENCOUNTER — Other Ambulatory Visit: Payer: Medicare Other

## 2013-10-14 ENCOUNTER — Ambulatory Visit (INDEPENDENT_AMBULATORY_CARE_PROVIDER_SITE_OTHER): Payer: Medicare Other | Admitting: Medical

## 2013-10-14 ENCOUNTER — Encounter: Payer: Self-pay | Admitting: Family Medicine

## 2013-10-14 ENCOUNTER — Encounter: Payer: Self-pay | Admitting: Medical

## 2013-10-14 VITALS — BP 100/70 | HR 62 | Temp 98.1°F | Resp 16 | Wt 212.0 lb

## 2013-10-14 DIAGNOSIS — R05 Cough: Secondary | ICD-10-CM

## 2013-10-14 DIAGNOSIS — M62838 Other muscle spasm: Secondary | ICD-10-CM

## 2013-10-14 DIAGNOSIS — M5380 Other specified dorsopathies, site unspecified: Secondary | ICD-10-CM

## 2013-10-14 DIAGNOSIS — M62830 Muscle spasm of back: Secondary | ICD-10-CM

## 2013-10-14 DIAGNOSIS — Z7901 Long term (current) use of anticoagulants: Secondary | ICD-10-CM

## 2013-10-14 DIAGNOSIS — IMO0001 Reserved for inherently not codable concepts without codable children: Secondary | ICD-10-CM

## 2013-10-14 DIAGNOSIS — D849 Immunodeficiency, unspecified: Secondary | ICD-10-CM

## 2013-10-14 DIAGNOSIS — R059 Cough, unspecified: Secondary | ICD-10-CM

## 2013-10-14 LAB — CK: CK TOTAL: 117 U/L (ref 7–232)

## 2013-10-14 MED ORDER — METHOCARBAMOL 500 MG PO TABS
500.0000 mg | ORAL_TABLET | Freq: Every evening | ORAL | Status: DC | PRN
Start: 2013-10-14 — End: 2014-02-17

## 2013-10-14 MED ORDER — AZITHROMYCIN 250 MG PO TABS
ORAL_TABLET | ORAL | Status: DC
Start: 2013-10-14 — End: 2013-11-15

## 2013-10-14 NOTE — Progress Notes (Signed)
Subjective:   [name] is a 73 y.o. male presenting on 10/14/2013 with right ear pain and back muscle spasm  He presents with a history of immunosuppression for multiple myeloma on Revlemid.    For the last 12 days has had uncontrollable cough, productive of sputum, colored, some right ear pain, neck pressure on the right. He has had some significant terrible back spasm.  Has had 2 recent massages and both massage therapist including the one at the cancer center commented on how significant his spasms were and that he should see his doctor.    Had recent stem cell transplant.  He recently took a whole bottle of Coricidin followed by some Mucinex D.  Of note, he also is on Coumadin amongst his other medications.   He has had some loose stools, has had some headache.   He denies fever, nausea, vomiting, sore throat, sinus pressure, no abdominal pain, no GU problems, no wheezing, no chest pain, no SOB.  No other aggravating or relieving factors.  No other complaint.  Review of Systems ROS as in subjective      Objective:     BP 100/70  Pulse 62  Temp(Src) 98.1 F (36.7 C) (Oral)  Resp 16  Wt 212 lb (96.163 kg)  General appearance: alert, no distress, WD/WN, not coughing currently, doesn't appear toxic, afebrile HEENT: normocephalic, sclerae anicteric, TMs pearly, nares patent, no discharge or erythema, pharynx normal Oral cavity: MMM, no lesions Neck: supple, mild right neck tenderness along sternocleidomastoid, no lymphadenopathy, no thyromegaly, no masses Heart: RRR, normal S1, S2, no murmurs Lungs: faint crackles left lower field minimally, otherwise clear, no wheezes, no rhonchi Abdomen: +bs, soft, non tender, non distended, no masses, no hepatomegaly, no splenomegaly Pulses: 2+ symmetric, upper and lower extremities, normal cap refill Back: + spasm particularly right side but some left, upper and lower paraspinal region Ext: no edema      Assessment: Encounter  Diagnoses  Name Primary?  . Cough Yes  . Myalgia and myositis   . Muscle spasm   . Back spasm   . Long term (current) use of anticoagulants   . Immunodeficiency      Plan: Discussed his underlying medical conditions, possible medication side effects of Revlmid which he has discussed with oncology including back spasms.  He seems to be drinking plenty of water, he doesn't appear dehydrated. He is due to see oncology in 2 days along with routine followup labs.  I reviewed his recent chest xray from 10/01/13. He was having the same symptoms then, and no obvious pneumonia or other at that time.  At this point I will check a CK level, he will use his Hydrocodone as a cough suppressant, will cover with Zpak, rest, and short term Robaxin prn.  We discussed other possible causes of his symptoms.  He declines other labs today, but will call back within 48 hours to update me on his symptoms.  He will f/u as planned with oncology Wednesday including a discussion of his spasms in the back.  If not improving at that point, may need to repeat CXR too.  [name] was seen today for right ear pain and back muscle spasm.  Diagnoses and associated orders for this visit:  Cough  Myalgia and myositis - CK  Muscle spasm  Back spasm  Long term (current) use of anticoagulants  Immunodeficiency  Other Orders - methocarbamol (ROBAXIN) 500 MG tablet; Take 1 tablet (500 mg total) by mouth at bedtime  as needed for muscle spasms. - azithromycin (ZITHROMAX) 250 MG tablet; 2 tablets day 1, then 1 tablet days 2-4    Return pending lab, call back.

## 2013-10-16 ENCOUNTER — Telehealth: Payer: Self-pay | Admitting: Medical

## 2013-10-16 ENCOUNTER — Other Ambulatory Visit: Payer: Self-pay | Admitting: *Deleted

## 2013-10-16 ENCOUNTER — Other Ambulatory Visit: Payer: Self-pay

## 2013-10-16 ENCOUNTER — Other Ambulatory Visit (HOSPITAL_BASED_OUTPATIENT_CLINIC_OR_DEPARTMENT_OTHER): Payer: Medicare Other

## 2013-10-16 ENCOUNTER — Encounter: Payer: Self-pay | Admitting: Internal Medicine

## 2013-10-16 ENCOUNTER — Telehealth: Payer: Self-pay | Admitting: Internal Medicine

## 2013-10-16 ENCOUNTER — Ambulatory Visit: Payer: Medicare Other

## 2013-10-16 ENCOUNTER — Other Ambulatory Visit: Payer: Medicare Other

## 2013-10-16 ENCOUNTER — Ambulatory Visit (HOSPITAL_BASED_OUTPATIENT_CLINIC_OR_DEPARTMENT_OTHER): Payer: Medicare Other

## 2013-10-16 ENCOUNTER — Ambulatory Visit (HOSPITAL_BASED_OUTPATIENT_CLINIC_OR_DEPARTMENT_OTHER): Payer: Medicare Other | Admitting: Internal Medicine

## 2013-10-16 VITALS — BP 123/69 | HR 72 | Temp 97.7°F | Resp 18 | Ht 69.0 in | Wt 215.3 lb

## 2013-10-16 DIAGNOSIS — M5380 Other specified dorsopathies, site unspecified: Secondary | ICD-10-CM

## 2013-10-16 DIAGNOSIS — C9000 Multiple myeloma not having achieved remission: Secondary | ICD-10-CM

## 2013-10-16 DIAGNOSIS — J3489 Other specified disorders of nose and nasal sinuses: Secondary | ICD-10-CM

## 2013-10-16 LAB — CBC WITH DIFFERENTIAL/PLATELET
BASO%: 7.3 % — AB (ref 0.0–2.0)
Basophils Absolute: 0.2 10*3/uL — ABNORMAL HIGH (ref 0.0–0.1)
EOS%: 17.3 % — ABNORMAL HIGH (ref 0.0–7.0)
Eosinophils Absolute: 0.5 10*3/uL (ref 0.0–0.5)
HEMATOCRIT: 39.6 % (ref 38.4–49.9)
HGB: 13.1 g/dL (ref 13.0–17.1)
LYMPH#: 0.7 10*3/uL — AB (ref 0.9–3.3)
LYMPH%: 22.3 % (ref 14.0–49.0)
MCH: 30.8 pg (ref 27.2–33.4)
MCHC: 33.1 g/dL (ref 32.0–36.0)
MCV: 93.2 fL (ref 79.3–98.0)
MONO#: 0.3 10*3/uL (ref 0.1–0.9)
MONO%: 11 % (ref 0.0–14.0)
NEUT#: 1.3 10*3/uL — ABNORMAL LOW (ref 1.5–6.5)
NEUT%: 42.1 % (ref 39.0–75.0)
Platelets: 181 10*3/uL (ref 140–400)
RBC: 4.25 10*6/uL (ref 4.20–5.82)
RDW: 12.8 % (ref 11.0–14.6)
WBC: 3 10*3/uL — AB (ref 4.0–10.3)
nRBC: 0 % (ref 0–0)

## 2013-10-16 LAB — COMPREHENSIVE METABOLIC PANEL (CC13)
ALBUMIN: 3.3 g/dL — AB (ref 3.5–5.0)
ALK PHOS: 61 U/L (ref 40–150)
ALT: 13 U/L (ref 0–55)
AST: 15 U/L (ref 5–34)
Anion Gap: 7 mEq/L (ref 3–11)
BILIRUBIN TOTAL: 0.37 mg/dL (ref 0.20–1.20)
BUN: 8.1 mg/dL (ref 7.0–26.0)
CO2: 26 mEq/L (ref 22–29)
Calcium: 9 mg/dL (ref 8.4–10.4)
Chloride: 106 mEq/L (ref 98–109)
Creatinine: 0.8 mg/dL (ref 0.7–1.3)
Glucose: 107 mg/dl (ref 70–140)
POTASSIUM: 3.8 meq/L (ref 3.5–5.1)
SODIUM: 140 meq/L (ref 136–145)
Total Protein: 6.6 g/dL (ref 6.4–8.3)

## 2013-10-16 LAB — LACTATE DEHYDROGENASE (CC13): LDH: 138 U/L (ref 125–245)

## 2013-10-16 MED ORDER — SODIUM CHLORIDE 0.9 % IJ SOLN
10.0000 mL | INTRAMUSCULAR | Status: DC | PRN
  Administered 2013-10-16: 10 mL via INTRAVENOUS
  Filled 2013-10-16: qty 10

## 2013-10-16 MED ORDER — SODIUM CHLORIDE 0.9 % IV SOLN
Freq: Once | INTRAVENOUS | Status: AC
  Administered 2013-10-16: 10:00:00 via INTRAVENOUS

## 2013-10-16 MED ORDER — HEPARIN SOD (PORK) LOCK FLUSH 100 UNIT/ML IV SOLN
500.0000 [IU] | Freq: Once | INTRAVENOUS | Status: AC
  Administered 2013-10-16: 500 [IU] via INTRAVENOUS
  Filled 2013-10-16: qty 5

## 2013-10-16 MED ORDER — ZOLEDRONIC ACID 4 MG/100ML IV SOLN
4.0000 mg | Freq: Once | INTRAVENOUS | Status: AC
  Administered 2013-10-16: 4 mg via INTRAVENOUS
  Filled 2013-10-16: qty 100

## 2013-10-16 NOTE — Patient Instructions (Signed)

## 2013-10-16 NOTE — Telephone Encounter (Signed)
Patient states that his doctor is okay with him taking a Zpak and he is also okay with him taking a muscle relaxer as well. CLS

## 2013-10-16 NOTE — Telephone Encounter (Signed)
Please call and ask what the oncologist said in regards to muscle relaxer, muscle spasms related to the Revlimid?

## 2013-10-16 NOTE — Telephone Encounter (Signed)
gv adn printed appt sched and avs for pt for May and June....sed adjusted tx

## 2013-10-16 NOTE — Progress Notes (Signed)
[place] [phone]   Fax:([phone]) [place], MD [place] [address]  DIAGNOSIS: Stage IIA multiple myeloma diagnosed in July of 2013.   PRIOR THERAPY:  1) Systemic chemotherapy with subcutaneous Velcade 1.3 mg/M2 on days 1, 4, 8 and 11 every 3 weeks in addition to Revlimid 25 mg by mouth daily for 14 days every 3 weeks and dexamethasone 40 mg on a weekly basis. Status post 4 planned cycles.  2) Systemic chemotherapy with subcutaneous Velcade 1.3 mg/M2 on days 1, 4, 8 and 11 every 3 weeks in addition to Revlimid 25 mg by mouth daily for 14 days every 3 weeks and dexamethasone 40 mg on a weekly basis. 4 more cycles are planned and he is status post 4 cycles of the second set of 4 cycles, now status post a total of 10 cycles. 3) Systemic chemotherapy with Carfilzomib 36 mg/M2 on days 1, 2, 8, 9, 15 and 16 in addition to cyclophosphamide 300 mg/M2 IV on days 1, 8 and 15 as well as dexamethasone on days 1, 8, 15 and 22 every 4 weeks. Status post 4 cycles. 4) Zometa 4 mg IV every month. 5) autologous peripheral blood stem cell transplant on 03/20/2013 at [facility]. 6) Consolidation chemotherapy with Carfilzomib 36 mg/M2 on days 1, 2, 8, 9, 15 and 16 every 4 weeks in addition to cyclophosphamide 300 mg/M2 on days 1, 8 and 15 as well as dexamethasone 40 mg on a weekly basis every 4 weeks. Status post 2 cycles. First cycle 06/11/2013.    CURRENT THERAPY:  1) maintenance therapy with Revlimid 10 mg by mouth daily status post 2 months of treatment. 2) Zometa 4 mg IV every 2 months.   INTERVAL HISTORY: JERONIMO HELLBERG 73 y.o. male returns to the clinic today for followup visit. The patient is feeling fine today except for sinus drainage and muscle spasm in the back. He was seen by his primary care physician few days ago and started on Z-Pak as well as Robaxin.  He tolerated his treatment fairly well  with no significant adverse effects. He denied having any significant fatigue or weakness. He denied having any fever or chills. He has no nausea or vomiting.  He denied having any significant chest pain, shortness of breath, cough or hemoptysis. He is here today for evaluation and discussion of maintenance treatment.  MEDICAL HISTORY: Past Medical History  Diagnosis Date  . GERD (gastroesophageal reflux disease)   . Adenomatous polyp of colon 1996  . Arthritis     Osteoarthritis right knee, spine, wrist  . Benign prostatic hypertrophy     (Dr. Karsten Ro)  . History of chronic prostatitis   . History of melanoma     Back (Dr. Wilhemina Bonito); early melanoma R arm 03/2011  . Diverticulosis   . Arthritis of hand, right     Right thumb (Dr. Daylene Katayama)  . Degenerative disc disease   . Hemorrhoids   . Erosive esophagitis   . Back ache     r/o Multiple Myeloma  . Multiple myeloma 2013    ALLERGIES:  is allergic to penicillins.  MEDICATIONS:  Current Outpatient Prescriptions  Medication Sig Dispense Refill  . acetaminophen (TYLENOL) 325 MG tablet Take 650 mg by mouth. prn      . ascorbic acid (VITAMIN C) 500 MG tablet Take 500 mg by mouth daily.      Marland Kitchen azithromycin (ZITHROMAX) 250  MG tablet 2 tablets day 1, then 1 tablet days 2-4  6 tablet  0  . B Complex-C (B-COMPLEX WITH VITAMIN C) tablet Take 1 tablet by mouth daily.      . celecoxib (CELEBREX) 200 MG capsule Take 200 mg by mouth daily as needed for pain.      . cholecalciferol (VITAMIN D) 1000 UNITS tablet Take 1,000 Units by mouth daily.      . clonazePAM (KLONOPIN) 0.5 MG tablet Take 0.5 mg by mouth. Take 1 tablet (0.5 mg total) by mouth Two (2) times a day.      . fish oil-omega-3 fatty acids 1000 MG capsule Take 1 g by mouth daily.       Marland Kitchen HYDROcodone-acetaminophen (NORCO) 7.5-325 MG per tablet Take 1 tablet by mouth every 6 (six) hours as needed.  30 tablet  0  . lenalidomide (REVLIMID) 10 MG capsule Take 1 capsule (10 mg total) by  mouth daily.  28 capsule  0  . methocarbamol (ROBAXIN) 500 MG tablet Take 1 tablet (500 mg total) by mouth at bedtime as needed for muscle spasms.  20 tablet  0  . Multiple Vitamin (MULTIVITAMIN WITH MINERALS) TABS Take 1 tablet by mouth daily.      . ondansetron (ZOFRAN-ODT) 4 MG disintegrating tablet Take 4 mg by mouth every 12 (twelve) hours as needed for nausea.      Marland Kitchen oxycodone (OXY-IR) 5 MG capsule Take 5 mg by mouth every 6 (six) hours as needed for pain.      . pantoprazole (PROTONIX) 40 MG tablet Take 40 mg by mouth. Take 1 tablet (40 mg total) by mouth Two (2) times a day.      . Probiotic Product (ALIGN) 4 MG CAPS Take 1 capsule by mouth daily.      . prochlorperazine (COMPAZINE) 10 MG tablet 10 mg every 6 (six) hours as needed.       . temazepam (RESTORIL) 15 MG capsule Take 1 capsule (15 mg total) by mouth at bedtime. Take 1 capsule (15 mg total) by mouth nightly as needed for sleep.  30 capsule  0  . warfarin (COUMADIN) 2 MG tablet Take 1 tablet (2 mg total) by mouth daily.  30 tablet  0  . zolendronic acid (ZOMETA) 4 MG/5ML injection Inject 4 mg into the vein every 28 (twenty-eight) days.      Marland Kitchen PRESCRIPTION MEDICATION Inject into the vein every 21 ( twenty-one) days. Kyprolis and Cytoxan infusion every 3 weeks (Monday and Tuesday)       No current facility-administered medications for this visit.   Facility-Administered Medications Ordered in Other Visits  Medication Dose Route Frequency Provider Last Rate Last Dose  . ondansetron (ZOFRAN) tablet 8 mg  8 mg Oral Once Curt Bears, MD      . sodium chloride 0.9 % injection 10 mL  10 mL Intracatheter PRN Curt Bears, MD   10 mL at 06/11/13 1616    SURGICAL HISTORY:  Past Surgical History  Procedure Laterality Date  . Total knee replaced  2000    Left knee  . Appendectomy    . Colonoscopy  2009  . Esophagogastroduodenoscopy  2009    mild inflammation at esophagogastric junction  . Excision of melanoma      back (  x3)  . Great toe surgery      bilateral  . Inguinal hernia repair      left, x 2  . Total knee arthroplasty  06/08/2011  Procedure: TOTAL KNEE ARTHROPLASTY;  Surgeon: Ninetta Lights, MD;  Location: [facility];  Service: Orthopedics;  Laterality: Right;  osteonics  . [place]  10/2012    REVIEW OF SYSTEMS:  Constitutional: negative Eyes: negative Ears, nose, mouth, throat, and face: negative Respiratory: negative Cardiovascular: negative Gastrointestinal: negative Genitourinary:negative Integument/breast: negative Hematologic/lymphatic: negative Musculoskeletal:negative Neurological: negative Behavioral/Psych: negative Endocrine: negative Allergic/Immunologic: negative   PHYSICAL EXAMINATION: General appearance: alert, cooperative and no distress Head: Normocephalic, without obvious abnormality, atraumatic Neck: no adenopathy, no JVD, supple, symmetrical, trachea midline and thyroid not enlarged, symmetric, no tenderness/mass/nodules Lymph nodes: Cervical, supraclavicular, and axillary nodes normal. Resp: clear to auscultation bilaterally Back: symmetric, no curvature. ROM normal. No CVA tenderness. Cardio: regular rate and rhythm, S1, S2 normal, no murmur, click, rub or gallop GI: soft, non-tender; bowel sounds normal; no masses,  no organomegaly Extremities: extremities normal, atraumatic, no cyanosis or edema Neurologic: Alert and oriented X 3, normal strength and tone. Normal symmetric reflexes. Normal coordination and gait  ECOG PERFORMANCE STATUS: 1 - Symptomatic but completely ambulatory  Blood pressure 123/69, pulse 72, temperature 97.7 F (36.5 C), temperature source Oral, resp. rate 18, height $RemoveBe'5\' 9"'NkAqgSdbg$  (1.753 m), weight 215 lb 4.8 oz (97.659 kg).  LABORATORY DATA: Lab Results  Component Value Date   WBC 3.0* 10/16/2013   HGB 13.1 10/16/2013   HCT 39.6 10/16/2013   MCV 93.2 10/16/2013   PLT 181 10/16/2013      Chemistry      Component Value Date/Time   NA 142  09/18/2013 0803   NA 138 01/29/2013 0200   K 3.9 09/18/2013 0803   K 3.4* 01/29/2013 0200   CL 104 01/29/2013 0200   CL 109* 12/31/2012 0759   CO2 24 09/18/2013 0803   CO2 28 01/29/2013 0200   BUN 10.3 09/18/2013 0803   BUN 18 01/29/2013 0200   CREATININE 0.9 09/18/2013 0803   CREATININE 1.10 01/29/2013 0200   CREATININE 0.98 01/18/2012 0934      Component Value Date/Time   CALCIUM 9.2 09/18/2013 0803   CALCIUM 9.0 01/29/2013 0200   ALKPHOS 57 09/18/2013 0803   ALKPHOS 50 01/29/2013 0200   AST 16 09/18/2013 0803   AST 18 01/29/2013 0200   ALT 16 09/18/2013 0803   ALT 16 01/29/2013 0200   BILITOT 0.43 09/18/2013 0803   BILITOT 0.4 01/29/2013 0200       RADIOGRAPHIC STUDIES: No results found.  ASSESSMENT AND PLAN: This is a very pleasant 73 years old white male with history of multiple myeloma status post induction chemotherapy with Velcade, Revlimid and dexamethasone followed by treatment with Carfilzomib, cyclophosphamide and dexamethasone with significant improvement in his disease followed by autologous peripheral blood stem cell transplant at [facility] on 03/20/2013. This was followed by 2 cycles of consolidation chemotherapy with Carfilzomib, Cytoxan and dexamethasone, and he tolerated it fairly well. He is currently on maintenance Revlimid 10 mg by mouth daily for the last 2 months and tolerating it fairly well. I recommended for the patient to continue his current treatment with Revlimid. I would see him back for followup visit in 4 weeks with repeat myeloma panel for reevaluation of his disease and if no evidence for disease progression, I would increase his Revlimid dose to 15 mg by mouth daily. He will continue treatment with Zometa every 2 months. He was advised to call immediately if he has any concerning symptoms in the interval. The patient voices understanding of current disease status and treatment options and  is in agreement with the current care plan.  All questions were  answered. The patient knows to call the clinic with any problems, questions or concerns. We can certainly see the patient much sooner if necessary.  Disclaimer: This note was dictated with voice recognition software. Similar sounding words can inadvertently be transcribed and may not be corrected upon review.

## 2013-10-21 ENCOUNTER — Other Ambulatory Visit: Payer: Self-pay | Admitting: Medical Oncology

## 2013-10-21 NOTE — Telephone Encounter (Signed)
Sent to [facility] for authorization.

## 2013-10-28 ENCOUNTER — Other Ambulatory Visit: Payer: Self-pay | Admitting: *Deleted

## 2013-10-28 DIAGNOSIS — C9000 Multiple myeloma not having achieved remission: Secondary | ICD-10-CM

## 2013-10-28 MED ORDER — LENALIDOMIDE 10 MG PO CAPS: 10.0000 mg | ORAL_CAPSULE | Freq: Every day | ORAL | Status: DC

## 2013-10-29 ENCOUNTER — Other Ambulatory Visit: Payer: Self-pay | Admitting: *Deleted

## 2013-10-29 DIAGNOSIS — C9000 Multiple myeloma not having achieved remission: Secondary | ICD-10-CM

## 2013-10-29 MED ORDER — LENALIDOMIDE 15 MG PO CAPS: 15.0000 mg | ORAL_CAPSULE | Freq: Every day | ORAL | Status: DC

## 2013-11-04 ENCOUNTER — Other Ambulatory Visit: Payer: Self-pay | Admitting: Medical Oncology

## 2013-11-04 NOTE — Progress Notes (Unsigned)
erroneous

## 2013-11-05 ENCOUNTER — Other Ambulatory Visit: Payer: Self-pay | Admitting: *Deleted

## 2013-11-05 DIAGNOSIS — C9000 Multiple myeloma not having achieved remission: Secondary | ICD-10-CM

## 2013-11-05 MED ORDER — WARFARIN SODIUM 2 MG PO TABS: 2.0000 mg | ORAL_TABLET | Freq: Every day | ORAL | Status: DC

## 2013-11-13 ENCOUNTER — Ambulatory Visit (HOSPITAL_BASED_OUTPATIENT_CLINIC_OR_DEPARTMENT_OTHER): Payer: Medicare Other | Admitting: Internal Medicine

## 2013-11-13 ENCOUNTER — Encounter: Payer: Self-pay | Admitting: Internal Medicine

## 2013-11-13 ENCOUNTER — Other Ambulatory Visit: Payer: Medicare Other

## 2013-11-13 ENCOUNTER — Ambulatory Visit: Payer: Medicare Other

## 2013-11-13 ENCOUNTER — Telehealth: Payer: Self-pay | Admitting: Internal Medicine

## 2013-11-13 ENCOUNTER — Other Ambulatory Visit (HOSPITAL_BASED_OUTPATIENT_CLINIC_OR_DEPARTMENT_OTHER): Payer: Medicare Other

## 2013-11-13 VITALS — BP 107/49 | HR 77 | Temp 98.4°F | Resp 18 | Ht 69.0 in | Wt 210.6 lb

## 2013-11-13 DIAGNOSIS — C9001 Multiple myeloma in remission: Secondary | ICD-10-CM

## 2013-11-13 DIAGNOSIS — C9000 Multiple myeloma not having achieved remission: Secondary | ICD-10-CM

## 2013-11-13 LAB — CBC WITH DIFFERENTIAL/PLATELET
BASO%: 2.6 % — AB (ref 0.0–2.0)
Basophils Absolute: 0.1 10*3/uL (ref 0.0–0.1)
EOS%: 14 % — AB (ref 0.0–7.0)
Eosinophils Absolute: 0.4 10*3/uL (ref 0.0–0.5)
HCT: 42.7 % (ref 38.4–49.9)
HGB: 14 g/dL (ref 13.0–17.1)
LYMPH#: 0.4 10*3/uL — AB (ref 0.9–3.3)
LYMPH%: 15.4 % (ref 14.0–49.0)
MCH: 29.4 pg (ref 27.2–33.4)
MCHC: 32.9 g/dL (ref 32.0–36.0)
MCV: 89.6 fL (ref 79.3–98.0)
MONO#: 0.3 10*3/uL (ref 0.1–0.9)
MONO%: 11.4 % (ref 0.0–14.0)
NEUT#: 1.5 10*3/uL (ref 1.5–6.5)
NEUT%: 56.6 % (ref 39.0–75.0)
Platelets: 128 10*3/uL — ABNORMAL LOW (ref 140–400)
RBC: 4.77 10*6/uL (ref 4.20–5.82)
RDW: 15 % — ABNORMAL HIGH (ref 11.0–14.6)
WBC: 2.6 10*3/uL — AB (ref 4.0–10.3)

## 2013-11-13 LAB — LACTATE DEHYDROGENASE (CC13): LDH: 233 U/L (ref 125–245)

## 2013-11-13 LAB — COMPREHENSIVE METABOLIC PANEL (CC13)
ALBUMIN: 3.2 g/dL — AB (ref 3.5–5.0)
ALT: 87 U/L — AB (ref 0–55)
AST: 95 U/L — AB (ref 5–34)
Alkaline Phosphatase: 45 U/L (ref 40–150)
Anion Gap: 8 mEq/L (ref 3–11)
BUN: 10.5 mg/dL (ref 7.0–26.0)
CALCIUM: 8.3 mg/dL — AB (ref 8.4–10.4)
CHLORIDE: 108 meq/L (ref 98–109)
CO2: 23 mEq/L (ref 22–29)
Creatinine: 1 mg/dL (ref 0.7–1.3)
Glucose: 105 mg/dl (ref 70–140)
Potassium: 3.2 mEq/L — ABNORMAL LOW (ref 3.5–5.1)
Sodium: 139 mEq/L (ref 136–145)
Total Bilirubin: 0.7 mg/dL (ref 0.20–1.20)
Total Protein: 6.2 g/dL — ABNORMAL LOW (ref 6.4–8.3)

## 2013-11-13 NOTE — Progress Notes (Signed)
Quick Note:  Call patient with the result and encourage K rich diet ______ 

## 2013-11-13 NOTE — Telephone Encounter (Signed)
gv and printed appts ched and avs for pt for May and June

## 2013-11-13 NOTE — Progress Notes (Signed)
[name] [phone]   Fax:(336) [place], MD [place] [address]  DIAGNOSIS: Stage IIA multiple myeloma diagnosed in July of 2013.   PRIOR THERAPY:  1) Systemic chemotherapy with subcutaneous Velcade 1.3 mg/M2 on days 1, 4, 8 and 11 every 3 weeks in addition to Revlimid 25 mg by mouth daily for 14 days every 3 weeks and dexamethasone 40 mg on a weekly basis. Status post 4 planned cycles.  2) Systemic chemotherapy with subcutaneous Velcade 1.3 mg/M2 on days 1, 4, 8 and 11 every 3 weeks in addition to Revlimid 25 mg by mouth daily for 14 days every 3 weeks and dexamethasone 40 mg on a weekly basis. 4 more cycles are planned and he is status post 4 cycles of the second set of 4 cycles, now status post a total of 10 cycles. 3) Systemic chemotherapy with Carfilzomib 36 mg/M2 on days 1, 2, 8, 9, 15 and 16 in addition to cyclophosphamide 300 mg/M2 IV on days 1, 8 and 15 as well as dexamethasone on days 1, 8, 15 and 22 every 4 weeks. Status post 4 cycles. 4) Zometa 4 mg IV every month. 5) autologous peripheral blood stem cell transplant on 03/20/2013 at [facility]. 6) Consolidation chemotherapy with Carfilzomib 36 mg/M2 on days 1, 2, 8, 9, 15 and 16 every 4 weeks in addition to cyclophosphamide 300 mg/M2 on days 1, 8 and 15 as well as dexamethasone 40 mg on a weekly basis every 4 weeks. Status post 2 cycles. First cycle 06/11/2013. 7) maintenance therapy with Revlimid 10 mg by mouth daily status post 3 months of treatment.    CURRENT THERAPY:  1) Maintenance therapy with Revlimid 15 mg by mouth daily started 11/09/2013. 2) Zometa 4 mg IV every 2 months.   INTERVAL HISTORY: [name] 73 y.o. male returns to the clinic today for followup visit. He started his treatment with Revlimid 15 mg by mouth daily you on 11/09/2013. He is tolerating his treatment fairly well with no significant adverse  effects. He has some recent cold symptoms but getting better. He denied having any significant fatigue or weakness. He denied having any fever or chills. He has no nausea or vomiting.  He denied having any significant chest pain, shortness of breath, cough or hemoptysis. He has myeloma panel performed earlier today and he is here today for evaluation and discussion of his lab results.  MEDICAL HISTORY: Past Medical History  Diagnosis Date  . GERD (gastroesophageal reflux disease)   . Adenomatous polyp of colon 1996  . Arthritis     Osteoarthritis right knee, spine, wrist  . Benign prostatic hypertrophy     (Dr. Karsten Ro)  . History of chronic prostatitis   . History of melanoma     Back (Dr. Wilhemina Bonito); early melanoma R arm 03/2011  . Diverticulosis   . Arthritis of hand, right     Right thumb (Dr. Daylene Katayama)  . Degenerative disc disease   . Hemorrhoids   . Erosive esophagitis   . Back ache     r/o Multiple Myeloma  . Multiple myeloma 2013    ALLERGIES:  is allergic to penicillins.  MEDICATIONS:  Current Outpatient Prescriptions  Medication Sig Dispense Refill  . acetaminophen (TYLENOL) 325 MG tablet Take 650 mg by mouth. prn      . ascorbic acid (VITAMIN C) 500 MG tablet Take 500 mg by mouth daily.      Marland Kitchen  azithromycin (ZITHROMAX) 250 MG tablet 2 tablets day 1, then 1 tablet days 2-4  6 tablet  0  . B Complex-C (B-COMPLEX WITH VITAMIN C) tablet Take 1 tablet by mouth daily.      . celecoxib (CELEBREX) 200 MG capsule Take 200 mg by mouth daily as needed for pain.      . cholecalciferol (VITAMIN D) 1000 UNITS tablet Take 1,000 Units by mouth daily.      . clonazePAM (KLONOPIN) 0.5 MG tablet Take 0.5 mg by mouth. Take 1 tablet (0.5 mg total) by mouth Two (2) times a day.      . fish oil-omega-3 fatty acids 1000 MG capsule Take 1 g by mouth daily.       Marland Kitchen HYDROcodone-acetaminophen (NORCO) 7.5-325 MG per tablet Take 1 tablet by mouth every 6 (six) hours as needed.  30 tablet  0  .  lenalidomide (REVLIMID) 15 MG capsule Take 1 capsule (15 mg total) by mouth daily.  28 capsule  0  . methocarbamol (ROBAXIN) 500 MG tablet Take 1 tablet (500 mg total) by mouth at bedtime as needed for muscle spasms.  20 tablet  0  . Multiple Vitamin (MULTIVITAMIN WITH MINERALS) TABS Take 1 tablet by mouth daily.      . ondansetron (ZOFRAN-ODT) 4 MG disintegrating tablet Take 4 mg by mouth every 12 (twelve) hours as needed for nausea.      Marland Kitchen oxycodone (OXY-IR) 5 MG capsule Take 5 mg by mouth every 6 (six) hours as needed for pain.      . pantoprazole (PROTONIX) 40 MG tablet Take 40 mg by mouth. Take 1 tablet (40 mg total) by mouth Two (2) times a day.      Marland Kitchen PRESCRIPTION MEDICATION Inject into the vein every 21 ( twenty-one) days. Kyprolis and Cytoxan infusion every 3 weeks (Monday and Tuesday)      . Probiotic Product (ALIGN) 4 MG CAPS Take 1 capsule by mouth daily.      . prochlorperazine (COMPAZINE) 10 MG tablet 10 mg every 6 (six) hours as needed.       . temazepam (RESTORIL) 15 MG capsule Take 1 capsule (15 mg total) by mouth at bedtime. Take 1 capsule (15 mg total) by mouth nightly as needed for sleep.  30 capsule  0  . warfarin (COUMADIN) 2 MG tablet Take 1 tablet (2 mg total) by mouth daily.  30 tablet  0  . zolendronic acid (ZOMETA) 4 MG/5ML injection Inject 4 mg into the vein every 28 (twenty-eight) days.       No current facility-administered medications for this visit.   Facility-Administered Medications Ordered in Other Visits  Medication Dose Route Frequency Provider Last Rate Last Dose  . ondansetron (ZOFRAN) tablet 8 mg  8 mg Oral Once Curt Bears, MD      . sodium chloride 0.9 % injection 10 mL  10 mL Intracatheter PRN Curt Bears, MD   10 mL at 06/11/13 1616    SURGICAL HISTORY:  Past Surgical History  Procedure Laterality Date  . Total knee replaced  2000    Left knee  . Appendectomy    . Colonoscopy  2009  . Esophagogastroduodenoscopy  2009    mild  inflammation at esophagogastric junction  . Excision of melanoma      back ( x3)  . Great toe surgery      bilateral  . Inguinal hernia repair      left, x 2  . Total knee arthroplasty  06/08/2011    Procedure: TOTAL KNEE ARTHROPLASTY;  Surgeon: Ninetta Lights, MD;  Location: [facility];  Service: Orthopedics;  Laterality: Right;  osteonics  . [place]  10/2012    REVIEW OF SYSTEMS:  Constitutional: negative Eyes: negative Ears, nose, mouth, throat, and face: negative Respiratory: negative Cardiovascular: negative Gastrointestinal: negative Genitourinary:negative Integument/breast: negative Hematologic/lymphatic: negative Musculoskeletal:negative Neurological: negative Behavioral/Psych: negative Endocrine: negative Allergic/Immunologic: negative   PHYSICAL EXAMINATION: General appearance: alert, cooperative and no distress Head: Normocephalic, without obvious abnormality, atraumatic Neck: no adenopathy, no JVD, supple, symmetrical, trachea midline and thyroid not enlarged, symmetric, no tenderness/mass/nodules Lymph nodes: Cervical, supraclavicular, and axillary nodes normal. Resp: clear to auscultation bilaterally Back: symmetric, no curvature. ROM normal. No CVA tenderness. Cardio: regular rate and rhythm, S1, S2 normal, no murmur, click, rub or gallop GI: soft, non-tender; bowel sounds normal; no masses,  no organomegaly Extremities: extremities normal, atraumatic, no cyanosis or edema Neurologic: Alert and oriented X 3, normal strength and tone. Normal symmetric reflexes. Normal coordination and gait  ECOG PERFORMANCE STATUS: 1 - Symptomatic but completely ambulatory  Blood pressure 107/49, pulse 77, temperature 98.4 F (36.9 C), resp. rate 18, height $RemoveBe'5\' 9"'sBbIPmmoz$  (1.753 m), weight 210 lb 9.6 oz (95.528 kg), SpO2 97.00%.  LABORATORY DATA: Lab Results  Component Value Date   WBC 2.6* 11/13/2013   HGB 14.0 11/13/2013   HCT 42.7 11/13/2013   MCV 89.6 11/13/2013   PLT 128* 11/13/2013       Chemistry      Component Value Date/Time   NA 140 10/16/2013 0840   NA 138 01/29/2013 0200   K 3.8 10/16/2013 0840   K 3.4* 01/29/2013 0200   CL 104 01/29/2013 0200   CL 109* 12/31/2012 0759   CO2 26 10/16/2013 0840   CO2 28 01/29/2013 0200   BUN 8.1 10/16/2013 0840   BUN 18 01/29/2013 0200   CREATININE 0.8 10/16/2013 0840   CREATININE 1.10 01/29/2013 0200   CREATININE 0.98 01/18/2012 0934      Component Value Date/Time   CALCIUM 9.0 10/16/2013 0840   CALCIUM 9.0 01/29/2013 0200   ALKPHOS 61 10/16/2013 0840   ALKPHOS 50 01/29/2013 0200   AST 15 10/16/2013 0840   AST 18 01/29/2013 0200   ALT 13 10/16/2013 0840   ALT 16 01/29/2013 0200   BILITOT 0.37 10/16/2013 0840   BILITOT 0.4 01/29/2013 0200       RADIOGRAPHIC STUDIES: No results found.  ASSESSMENT AND PLAN: This is a very pleasant 73 years old white male with history of multiple myeloma status post induction chemotherapy with Velcade, Revlimid and dexamethasone followed by treatment with Carfilzomib, cyclophosphamide and dexamethasone with significant improvement in his disease followed by autologous peripheral blood stem cell transplant at [facility] on 03/20/2013. This was followed by 2 cycles of consolidation chemotherapy with Carfilzomib, Cytoxan and dexamethasone, and he tolerated it fairly well. He completed maintenance Revlimid 10 mg by mouth daily for the last 3 months and tolerating it fairly well. He started maintenance Revlimid with 15 mg by mouth daily last week and tolerating it well. His myeloma panel is pending. If no significant evidence for disease progression, the patient will continue with his current maintenance treatment with Revlimid His total white blood count and absolute neutrophil count are slightly low today. I recommended for the patient to have repeat CBC and comprehensive metabolic panel in 2 weeks. I would see him back for followup visit in one month for reevaluation and repeat blood work. He  will continue  treatment with Zometa every 2 months. He was advised to call immediately if he has any concerning symptoms in the interval. The patient voices understanding of current disease status and treatment options and is in agreement with the current care plan.  All questions were answered. The patient knows to call the clinic with any problems, questions or concerns. We can certainly see the patient much sooner if necessary.  Disclaimer: This note was dictated with voice recognition software. Similar sounding words can inadvertently be transcribed and may not be corrected upon review.

## 2013-11-14 ENCOUNTER — Telehealth: Payer: Self-pay | Admitting: Medical Oncology

## 2013-11-14 NOTE — Telephone Encounter (Signed)
Message copied by Ardeen Garland on Thu Nov 14, 2013  1:02 PM ------      Message from: Curt Bears      Created: Wed Nov 13, 2013  1:46 PM       Call patient with the result and encourage K rich diet ------

## 2013-11-14 NOTE — Telephone Encounter (Signed)
Pt instructed on K+ rich diet and food sheet mailed to pt.

## 2013-11-14 NOTE — Telephone Encounter (Signed)
Reports onset of "stomach discomfort and clay colored  to white colored stool and  worried its my liver". He said he will monitor it for  now. Dr [name] notified.

## 2013-11-15 ENCOUNTER — Ambulatory Visit (INDEPENDENT_AMBULATORY_CARE_PROVIDER_SITE_OTHER): Payer: Medicare Other | Admitting: Family Medicine

## 2013-11-15 ENCOUNTER — Telehealth: Payer: Self-pay | Admitting: *Deleted

## 2013-11-15 ENCOUNTER — Encounter: Payer: Self-pay | Admitting: Family Medicine

## 2013-11-15 VITALS — BP 106/62 | HR 64 | Wt 210.0 lb

## 2013-11-15 DIAGNOSIS — K529 Noninfective gastroenteritis and colitis, unspecified: Secondary | ICD-10-CM

## 2013-11-15 DIAGNOSIS — K5289 Other specified noninfective gastroenteritis and colitis: Secondary | ICD-10-CM

## 2013-11-15 DIAGNOSIS — C9000 Multiple myeloma not having achieved remission: Secondary | ICD-10-CM

## 2013-11-15 DIAGNOSIS — R748 Abnormal levels of other serum enzymes: Secondary | ICD-10-CM

## 2013-11-15 LAB — CBC WITH DIFFERENTIAL/PLATELET
Basophils Absolute: 0.1 10*3/uL (ref 0.0–0.1)
Basophils Relative: 4 % — ABNORMAL HIGH (ref 0–1)
EOS ABS: 0.4 10*3/uL (ref 0.0–0.7)
Eosinophils Relative: 14 % — ABNORMAL HIGH (ref 0–5)
HCT: 38.2 % — ABNORMAL LOW (ref 39.0–52.0)
Hemoglobin: 13 g/dL (ref 13.0–17.0)
LYMPHS PCT: 29 % (ref 12–46)
Lymphs Abs: 0.8 10*3/uL (ref 0.7–4.0)
MCH: 29.3 pg (ref 26.0–34.0)
MCHC: 34 g/dL (ref 30.0–36.0)
MCV: 86 fL (ref 78.0–100.0)
Monocytes Absolute: 0.3 10*3/uL (ref 0.1–1.0)
Monocytes Relative: 12 % (ref 3–12)
NEUTROS PCT: 41 % — AB (ref 43–77)
Neutro Abs: 1.2 10*3/uL — ABNORMAL LOW (ref 1.7–7.7)
PLATELETS: 137 10*3/uL — AB (ref 150–400)
RBC: 4.44 MIL/uL (ref 4.22–5.81)
RDW: 14.1 % (ref 11.5–15.5)
WBC: 2.9 10*3/uL — AB (ref 4.0–10.5)

## 2013-11-15 LAB — HEPATITIS A ANTIBODY, IGM: HEP A IGM: NONREACTIVE

## 2013-11-15 LAB — KAPPA/LAMBDA LIGHT CHAINS
Kappa free light chain: 2.01 mg/dL — ABNORMAL HIGH (ref 0.33–1.94)
Kappa:Lambda Ratio: 0.65 (ref 0.26–1.65)
Lambda Free Lght Chn: 3.1 mg/dL — ABNORMAL HIGH (ref 0.57–2.63)

## 2013-11-15 LAB — IGG, IGA, IGM
IGA: 119 mg/dL (ref 68–379)
IGG (IMMUNOGLOBIN G), SERUM: 1010 mg/dL (ref 650–1600)
IgM, Serum: 32 mg/dL — ABNORMAL LOW (ref 41–251)

## 2013-11-15 LAB — COMPREHENSIVE METABOLIC PANEL
ALT: 45 U/L (ref 0–53)
AST: 22 U/L (ref 0–37)
Albumin: 3.7 g/dL (ref 3.5–5.2)
Alkaline Phosphatase: 43 U/L (ref 39–117)
BILIRUBIN TOTAL: 0.5 mg/dL (ref 0.2–1.2)
BUN: 9 mg/dL (ref 6–23)
CHLORIDE: 106 meq/L (ref 96–112)
CO2: 24 meq/L (ref 19–32)
Calcium: 8.1 mg/dL — ABNORMAL LOW (ref 8.4–10.5)
Creat: 0.8 mg/dL (ref 0.50–1.35)
Glucose, Bld: 100 mg/dL — ABNORMAL HIGH (ref 70–99)
Potassium: 3.5 mEq/L (ref 3.5–5.3)
SODIUM: 140 meq/L (ref 135–145)
Total Protein: 5.9 g/dL — ABNORMAL LOW (ref 6.0–8.3)

## 2013-11-15 LAB — BETA 2 MICROGLOBULIN, SERUM: Beta-2 Microglobulin: 3.08 mg/L — ABNORMAL HIGH (ref <=2.51)

## 2013-11-15 NOTE — Progress Notes (Signed)
   Subjective:    Patient ID: [name], male    DOB: [birth year], 73 y.o.   MRN: [id number]  HPI He has a history of multiple myeloma and is presently being followed by Dr. [name]. He is on medications listed in the chart. 4 days ago he had the onset of vomiting, diarrhea him a fever and chills, fatigue and slight dizziness. He did have a pizza approximately 3 hours before the symptoms occurred .He was seen by Dr. [name] on Wednesday and blood work was done. It did show a low white count as well as slightly elevated liver enzymes potassium 3.2. He was told to stop his Revlimid He noted grayish clay colored stools starting yesterday. He also noted brownish urine   Review of Systems     Objective:   Physical Exam alert and in no distress. Sclera appear normal. Skin is nonicteric Tympanic membranes and canals are normal. Throat is clear. Tonsils are normal. Neck is supple without adenopathy or thyromegaly. Cardiac exam shows a regular sinus rhythm without murmurs or gallops. Lungs are clear to auscultation. Abdominal exam shows no hepatosplenomegaly, masses or tenderness.       Assessment & Plan:  Acute gastroenteritis - Plan: CBC with Differential, Comprehensive metabolic panel  Elevated liver enzymes - Plan: CBC with Differential, Comprehensive metabolic panel, Hepatitis A Antibody, IGM  Multiple myeloma  I explained that this is probably a viral gastroenteritis with associated hepatitis. He has started been checked for hepatitis B and C. prior to his bone marrow transplant. I will do routine blood work on him in followup pending results of this.

## 2013-11-15 NOTE — Telephone Encounter (Signed)
Pt called stating that he is dizzy, disoriented.  He wants to know if he can hold his revlimid.  Dr Vista Mink got on phone to speak to pt regarding his liver enzymes.  He is concerned about the acute increase and feels he needs to be evaluated today, either by PCP, urgent care of an ED.  We will follow up with him Monday.  Pt was advised to hold revlimid for now, hold any tylenol, ibuprofen or alcohol.

## 2013-11-16 ENCOUNTER — Encounter: Payer: Self-pay | Admitting: Family Medicine

## 2013-11-16 NOTE — Progress Notes (Signed)
   Subjective:    Patient ID: [name], male    DOB: May 10, 1941, 73 y.o.   MRN: [id number]  HPI    Review of Systems     Objective:   Physical Exam        Assessment & Plan:  His blood work was reviewed and discussed with the oncologist on call. She recommended rechecking this on Monday however if he starts to run a fever, he is to be placed on Augmentin. This information was relayed to the patient.

## 2013-11-16 NOTE — Addendum Note (Signed)
Addended by: Denita Lung on: 11/16/2013 08:45 AM   Modules accepted: Orders

## 2013-11-17 ENCOUNTER — Encounter: Payer: Self-pay | Admitting: Family Medicine

## 2013-11-18 ENCOUNTER — Ambulatory Visit (INDEPENDENT_AMBULATORY_CARE_PROVIDER_SITE_OTHER): Payer: Medicare Other | Admitting: Family Medicine

## 2013-11-18 ENCOUNTER — Encounter: Payer: Self-pay | Admitting: Family Medicine

## 2013-11-18 ENCOUNTER — Telehealth: Payer: Self-pay | Admitting: Medical Oncology

## 2013-11-18 DIAGNOSIS — R748 Abnormal levels of other serum enzymes: Secondary | ICD-10-CM

## 2013-11-18 DIAGNOSIS — K529 Noninfective gastroenteritis and colitis, unspecified: Secondary | ICD-10-CM

## 2013-11-18 DIAGNOSIS — K5289 Other specified noninfective gastroenteritis and colitis: Secondary | ICD-10-CM

## 2013-11-18 LAB — CBC WITH DIFFERENTIAL/PLATELET
BASOS ABS: 0.2 10*3/uL — AB (ref 0.0–0.1)
BASOS PCT: 5 % — AB (ref 0–1)
Eosinophils Absolute: 0.4 10*3/uL (ref 0.0–0.7)
Eosinophils Relative: 13 % — ABNORMAL HIGH (ref 0–5)
HEMATOCRIT: 37.2 % — AB (ref 39.0–52.0)
Hemoglobin: 12.5 g/dL — ABNORMAL LOW (ref 13.0–17.0)
LYMPHS PCT: 30 % (ref 12–46)
Lymphs Abs: 0.9 10*3/uL (ref 0.7–4.0)
MCH: 28.7 pg (ref 26.0–34.0)
MCHC: 33.6 g/dL (ref 30.0–36.0)
MCV: 85.5 fL (ref 78.0–100.0)
MONO ABS: 0.3 10*3/uL (ref 0.1–1.0)
Monocytes Relative: 9 % (ref 3–12)
Neutro Abs: 1.3 10*3/uL — ABNORMAL LOW (ref 1.7–7.7)
Neutrophils Relative %: 43 % (ref 43–77)
PLATELETS: 169 10*3/uL (ref 150–400)
RBC: 4.35 MIL/uL (ref 4.22–5.81)
RDW: 14.2 % (ref 11.5–15.5)
WBC: 3 10*3/uL — AB (ref 4.0–10.5)

## 2013-11-18 LAB — COMPREHENSIVE METABOLIC PANEL
ALK PHOS: 40 U/L (ref 39–117)
ALT: 33 U/L (ref 0–53)
AST: 25 U/L (ref 0–37)
Albumin: 3.8 g/dL (ref 3.5–5.2)
BUN: 10 mg/dL (ref 6–23)
CO2: 26 mEq/L (ref 19–32)
Calcium: 9 mg/dL (ref 8.4–10.5)
Chloride: 106 mEq/L (ref 96–112)
Creat: 0.97 mg/dL (ref 0.50–1.35)
Glucose, Bld: 116 mg/dL — ABNORMAL HIGH (ref 70–99)
POTASSIUM: 3.9 meq/L (ref 3.5–5.3)
Sodium: 141 mEq/L (ref 135–145)
Total Bilirubin: 0.4 mg/dL (ref 0.2–1.2)
Total Protein: 6.2 g/dL (ref 6.0–8.3)

## 2013-11-18 NOTE — Progress Notes (Signed)
   Subjective:    Patient ID: [name], male    DOB: 1941/04/28, 73 y.o.   MRN: [id number]  HPI He is here for recheck. He states that his stool has now returned to normal color although he still is having some difficulty with cramping and diarrhea. Is also noted his urine turning back to the normal color. No fever or chills.   Review of Systems     Objective:   Physical Exam Alert and in no distress. Sclera are normal. Cardiac and lung exam normal. Abdominal exam shows no masses or tenderness       Assessment & Plan:  Elevated liver enzymes - Plan: Comprehensive metabolic panel, CBC with Differential, Comprehensive metabolic panel, CANCELED: CBC with Differential  Acute gastroenteritis - Plan: CBC with Differential, Comprehensive metabolic panel  I will followup mainly concerning his white blood count. Encouraged him to avoid dairy products and use activity or possibly Dannon yogurt.

## 2013-11-18 NOTE — Telephone Encounter (Signed)
[name] had cmet doen today at Dr Sydnee Cabal. Results given to dr [name].

## 2013-11-18 NOTE — Telephone Encounter (Signed)
I instructed pt , per Dr [name] to resume his revlimid and warfarin.

## 2013-11-19 ENCOUNTER — Telehealth: Payer: Self-pay | Admitting: Family Medicine

## 2013-11-19 NOTE — Telephone Encounter (Signed)
LM

## 2013-11-20 ENCOUNTER — Other Ambulatory Visit: Payer: Self-pay | Admitting: Medical Oncology

## 2013-11-20 ENCOUNTER — Telehealth: Payer: Self-pay | Admitting: *Deleted

## 2013-11-20 DIAGNOSIS — C9000 Multiple myeloma not having achieved remission: Secondary | ICD-10-CM

## 2013-11-20 MED ORDER — LENALIDOMIDE 15 MG PO CAPS: 15.0000 mg | ORAL_CAPSULE | Freq: Every day | ORAL | Status: DC

## 2013-11-20 NOTE — Telephone Encounter (Signed)
Spoke with pt on cell phone and instructed pt to call [facility] to take survey for Revlimid refill.

## 2013-11-25 ENCOUNTER — Telehealth: Payer: Self-pay | Admitting: *Deleted

## 2013-11-25 ENCOUNTER — Other Ambulatory Visit (HOSPITAL_BASED_OUTPATIENT_CLINIC_OR_DEPARTMENT_OTHER): Payer: Medicare Other

## 2013-11-25 DIAGNOSIS — C9000 Multiple myeloma not having achieved remission: Secondary | ICD-10-CM

## 2013-11-25 LAB — COMPREHENSIVE METABOLIC PANEL (CC13)
ALK PHOS: 47 U/L (ref 40–150)
ALT: 18 U/L (ref 0–55)
AST: 15 U/L (ref 5–34)
Albumin: 3.3 g/dL — ABNORMAL LOW (ref 3.5–5.0)
Anion Gap: 9 mEq/L (ref 3–11)
BUN: 11.8 mg/dL (ref 7.0–26.0)
CO2: 21 mEq/L — ABNORMAL LOW (ref 22–29)
Calcium: 8.7 mg/dL (ref 8.4–10.4)
Chloride: 113 mEq/L — ABNORMAL HIGH (ref 98–109)
Creatinine: 0.8 mg/dL (ref 0.7–1.3)
GLUCOSE: 87 mg/dL (ref 70–140)
Potassium: 3.9 mEq/L (ref 3.5–5.1)
Sodium: 143 mEq/L (ref 136–145)
TOTAL PROTEIN: 6.1 g/dL — AB (ref 6.4–8.3)
Total Bilirubin: 0.44 mg/dL (ref 0.20–1.20)

## 2013-11-25 LAB — CBC WITH DIFFERENTIAL/PLATELET
BASO%: 6.3 % — ABNORMAL HIGH (ref 0.0–2.0)
BASOS ABS: 0.2 10*3/uL — AB (ref 0.0–0.1)
EOS%: 13.5 % — AB (ref 0.0–7.0)
Eosinophils Absolute: 0.4 10*3/uL (ref 0.0–0.5)
HEMATOCRIT: 38.4 % (ref 38.4–49.9)
HGB: 12.9 g/dL — ABNORMAL LOW (ref 13.0–17.1)
LYMPH%: 23 % (ref 14.0–49.0)
MCH: 29.3 pg (ref 27.2–33.4)
MCHC: 33.6 g/dL (ref 32.0–36.0)
MCV: 87.1 fL (ref 79.3–98.0)
MONO#: 0.3 10*3/uL (ref 0.1–0.9)
MONO%: 10.5 % (ref 0.0–14.0)
NEUT#: 1.4 10*3/uL — ABNORMAL LOW (ref 1.5–6.5)
NEUT%: 46.7 % (ref 39.0–75.0)
Platelets: 176 10*3/uL (ref 140–400)
RBC: 4.41 10*6/uL (ref 4.20–5.82)
RDW: 14.3 % (ref 11.0–14.6)
WBC: 3 10*3/uL — ABNORMAL LOW (ref 4.0–10.3)
lymph#: 0.7 10*3/uL — ABNORMAL LOW (ref 0.9–3.3)
nRBC: 0 % (ref 0–0)

## 2013-11-25 LAB — LACTATE DEHYDROGENASE (CC13): LDH: 135 U/L (ref 125–245)

## 2013-11-25 NOTE — Telephone Encounter (Signed)
Pt called wanting to know about his lab results.  He is going out of town to [place] until 11/30/13 and wants to make sure it is okay to travel.  Per Dr Vista Mink, okay for pt to travel, neutropenic pxns given to the patient.  He verbalized understanding.  He will take a thermometer with him and will call if he develops a fever.  SLJ

## 2013-11-29 ENCOUNTER — Encounter: Payer: Self-pay | Admitting: Family Medicine

## 2013-12-11 ENCOUNTER — Ambulatory Visit: Payer: Medicare Other

## 2013-12-11 ENCOUNTER — Other Ambulatory Visit: Payer: Medicare Other

## 2013-12-18 ENCOUNTER — Ambulatory Visit (HOSPITAL_BASED_OUTPATIENT_CLINIC_OR_DEPARTMENT_OTHER): Payer: Medicare Other | Admitting: Internal Medicine

## 2013-12-18 ENCOUNTER — Telehealth: Payer: Self-pay | Admitting: *Deleted

## 2013-12-18 ENCOUNTER — Telehealth: Payer: Self-pay | Admitting: Internal Medicine

## 2013-12-18 ENCOUNTER — Ambulatory Visit: Payer: Medicare Other

## 2013-12-18 ENCOUNTER — Other Ambulatory Visit (HOSPITAL_BASED_OUTPATIENT_CLINIC_OR_DEPARTMENT_OTHER): Payer: Medicare Other

## 2013-12-18 ENCOUNTER — Encounter: Payer: Self-pay | Admitting: Internal Medicine

## 2013-12-18 ENCOUNTER — Ambulatory Visit (HOSPITAL_BASED_OUTPATIENT_CLINIC_OR_DEPARTMENT_OTHER): Payer: Medicare Other

## 2013-12-18 VITALS — BP 118/69 | HR 61 | Temp 97.7°F | Resp 20 | Ht 69.0 in | Wt 213.0 lb

## 2013-12-18 DIAGNOSIS — C9000 Multiple myeloma not having achieved remission: Secondary | ICD-10-CM

## 2013-12-18 DIAGNOSIS — R21 Rash and other nonspecific skin eruption: Secondary | ICD-10-CM

## 2013-12-18 DIAGNOSIS — Z9484 Stem cells transplant status: Secondary | ICD-10-CM

## 2013-12-18 LAB — CBC WITH DIFFERENTIAL/PLATELET
BASO%: 6.4 % — AB (ref 0.0–2.0)
Basophils Absolute: 0.2 10*3/uL — ABNORMAL HIGH (ref 0.0–0.1)
EOS ABS: 0.4 10*3/uL (ref 0.0–0.5)
EOS%: 12.2 % — ABNORMAL HIGH (ref 0.0–7.0)
HCT: 39 % (ref 38.4–49.9)
HGB: 13.1 g/dL (ref 13.0–17.1)
LYMPH%: 25.1 % (ref 14.0–49.0)
MCH: 28.8 pg (ref 27.2–33.4)
MCHC: 33.6 g/dL (ref 32.0–36.0)
MCV: 85.7 fL (ref 79.3–98.0)
MONO#: 0.3 10*3/uL (ref 0.1–0.9)
MONO%: 10.9 % (ref 0.0–14.0)
NEUT#: 1.4 10*3/uL — ABNORMAL LOW (ref 1.5–6.5)
NEUT%: 45.4 % (ref 39.0–75.0)
Platelets: 163 10*3/uL (ref 140–400)
RBC: 4.55 10*6/uL (ref 4.20–5.82)
RDW: 16 % — ABNORMAL HIGH (ref 11.0–14.6)
WBC: 3.1 10*3/uL — AB (ref 4.0–10.3)
lymph#: 0.8 10*3/uL — ABNORMAL LOW (ref 0.9–3.3)
nRBC: 0 % (ref 0–0)

## 2013-12-18 LAB — COMPREHENSIVE METABOLIC PANEL (CC13)
ALBUMIN: 3.4 g/dL — AB (ref 3.5–5.0)
ALT: 23 U/L (ref 0–55)
ANION GAP: 9 meq/L (ref 3–11)
AST: 20 U/L (ref 5–34)
Alkaline Phosphatase: 55 U/L (ref 40–150)
BUN: 12 mg/dL (ref 7.0–26.0)
CALCIUM: 9.1 mg/dL (ref 8.4–10.4)
CHLORIDE: 106 meq/L (ref 98–109)
CO2: 25 meq/L (ref 22–29)
Creatinine: 1 mg/dL (ref 0.7–1.3)
Glucose: 89 mg/dl (ref 70–140)
Potassium: 4.2 mEq/L (ref 3.5–5.1)
Sodium: 140 mEq/L (ref 136–145)
Total Bilirubin: 0.76 mg/dL (ref 0.20–1.20)
Total Protein: 6.5 g/dL (ref 6.4–8.3)

## 2013-12-18 LAB — LACTATE DEHYDROGENASE (CC13): LDH: 139 U/L (ref 125–245)

## 2013-12-18 MED ORDER — HEPARIN SOD (PORK) LOCK FLUSH 100 UNIT/ML IV SOLN
500.0000 [IU] | Freq: Once | INTRAVENOUS | Status: AC | PRN
  Administered 2013-12-18: 500 [IU]
  Filled 2013-12-18: qty 5

## 2013-12-18 MED ORDER — ZOLEDRONIC ACID 4 MG/100ML IV SOLN
4.0000 mg | Freq: Once | INTRAVENOUS | Status: AC
  Administered 2013-12-18: 4 mg via INTRAVENOUS
  Filled 2013-12-18: qty 100

## 2013-12-18 MED ORDER — METHYLPREDNISOLONE (PAK) 4 MG PO TABS
ORAL_TABLET | ORAL | Status: DC
Start: 2013-12-18 — End: 2014-01-21

## 2013-12-18 MED ORDER — SODIUM CHLORIDE 0.9 % IJ SOLN
10.0000 mL | INTRAMUSCULAR | Status: DC | PRN
  Administered 2013-12-18: 10 mL
  Filled 2013-12-18: qty 10

## 2013-12-18 MED ORDER — METHYLPREDNISOLONE (PAK) 4 MG PO TABS: ORAL_TABLET | ORAL | Status: DC

## 2013-12-18 MED ORDER — SODIUM CHLORIDE 0.9 % IV SOLN
Freq: Once | INTRAVENOUS | Status: AC
Start: 2013-12-18 — End: 2013-12-18
  Administered 2013-12-18: 09:00:00 via INTRAVENOUS

## 2013-12-18 NOTE — Patient Instructions (Signed)
[facility] Discharge Instructions for Patients Receiving Chemotherapy  Today you received the following chemotherapy agents Zometa   If you develop nausea and vomiting that is not controlled by your nausea medication, call the clinic.   BELOW ARE SYMPTOMS THAT SHOULD BE REPORTED IMMEDIATELY:  *FEVER GREATER THAN 100.5 F  *CHILLS WITH OR WITHOUT FEVER  NAUSEA AND VOMITING THAT IS NOT CONTROLLED WITH YOUR NAUSEA MEDICATION  *UNUSUAL SHORTNESS OF BREATH  *UNUSUAL BRUISING OR BLEEDING  TENDERNESS IN MOUTH AND THROAT WITH OR WITHOUT PRESENCE OF ULCERS  *URINARY PROBLEMS  *BOWEL PROBLEMS  UNUSUAL RASH Items with * indicate a potential emergency and should be followed up as soon as possible.  Feel free to call the clinic you have any questions or concerns. The clinic phone number is [phone].

## 2013-12-18 NOTE — Telephone Encounter (Signed)
Per staff phone call and POF I have schedueld appts.  JMW  

## 2013-12-18 NOTE — Telephone Encounter (Signed)
gave pt appt for lab,md and chemo for July and August 2015

## 2013-12-18 NOTE — Progress Notes (Signed)
[facility] [phone]   Fax:([phone]) Buf[place], MD [place] [address]  DIAGNOSIS: Stage IIA multiple myeloma diagnosed in July of 2013.   PRIOR THERAPY:  1) Systemic chemotherapy with subcutaneous Velcade 1.3 mg/M2 on days 1, 4, 8 and 11 every 3 weeks in addition to Revlimid 25 mg by mouth daily for 14 days every 3 weeks and dexamethasone 40 mg on a weekly basis. Status post 4 planned cycles.  2) Systemic chemotherapy with subcutaneous Velcade 1.3 mg/M2 on days 1, 4, 8 and 11 every 3 weeks in addition to Revlimid 25 mg by mouth daily for 14 days every 3 weeks and dexamethasone 40 mg on a weekly basis. 4 more cycles are planned and he is status post 4 cycles of the second set of 4 cycles, now status post a total of 10 cycles. 3) Systemic chemotherapy with Carfilzomib 36 mg/M2 on days 1, 2, 8, 9, 15 and 16 in addition to cyclophosphamide 300 mg/M2 IV on days 1, 8 and 15 as well as dexamethasone on days 1, 8, 15 and 22 every 4 weeks. Status post 4 cycles. 4) Zometa 4 mg IV every month. 5) autologous peripheral blood stem cell transplant on 03/20/2013 at [facility]. 6) Consolidation chemotherapy with Carfilzomib 36 mg/M2 on days 1, 2, 8, 9, 15 and 16 every 4 weeks in addition to cyclophosphamide 300 mg/M2 on days 1, 8 and 15 as well as dexamethasone 40 mg on a weekly basis every 4 weeks. Status post 2 cycles. First cycle 06/11/2013. 7) maintenance therapy with Revlimid 10 mg by mouth daily status post 3 months of treatment.    CURRENT THERAPY:  1) Maintenance therapy with Revlimid 15 mg by mouth daily started 11/09/2013. 2) Zometa 4 mg IV every 2 months.   INTERVAL HISTORY: [name] 73 y.o. male returns to the clinic today for followup visit. He started his treatment with Revlimid 15 mg by mouth daily you on 11/09/2013. He is tolerating his treatment fairly well with no significant adverse  effects except for development of skin rash on the upper and lower extremities mainly on the sun exposed area. The patient [place] the beach for the last few weeks. He also complains of dry skin. He denied having any significant fatigue or weakness. He denied having any fever or chills. He has no nausea or vomiting.  He denied having any significant chest pain, shortness of breath, cough or hemoptysis.   MEDICAL HISTORY: Past Medical History  Diagnosis Date  . GERD (gastroesophageal reflux disease)   . Adenomatous polyp of colon 1996  . Arthritis     Osteoarthritis right knee, spine, wrist  . Benign prostatic hypertrophy     (Dr. Karsten Ro)  . History of chronic prostatitis   . History of melanoma     Back (Dr. Wilhemina Bonito); early melanoma R arm 03/2011  . Diverticulosis   . Arthritis of hand, right     Right thumb (Dr. Daylene Katayama)  . Degenerative disc disease   . Hemorrhoids   . Erosive esophagitis   . Back ache     r/o Multiple Myeloma  . Multiple myeloma 2013    ALLERGIES:  is allergic to penicillins.  MEDICATIONS:  Current Outpatient Prescriptions  Medication Sig Dispense Refill  . acetaminophen (TYLENOL) 325 MG tablet Take 650 mg by mouth. prn      . ascorbic acid (VITAMIN C) 500  MG tablet Take 500 mg by mouth daily.      . B Complex-C (B-COMPLEX WITH VITAMIN C) tablet Take 1 tablet by mouth daily.      . celecoxib (CELEBREX) 200 MG capsule Take 200 mg by mouth daily as needed for pain.      . cholecalciferol (VITAMIN D) 1000 UNITS tablet Take 1,000 Units by mouth daily.      . fish oil-omega-3 fatty acids 1000 MG capsule Take 1 g by mouth daily.       Marland Kitchen HYDROcodone-acetaminophen (NORCO) 7.5-325 MG per tablet Take 1 tablet by mouth every 6 (six) hours as needed.  30 tablet  0  . lenalidomide (REVLIMID) 15 MG capsule Take 1 capsule (15 mg total) by mouth daily.  28 capsule  0  . methocarbamol (ROBAXIN) 500 MG tablet Take 1 tablet (500 mg total) by mouth at bedtime as needed for  muscle spasms.  20 tablet  0  . Multiple Vitamin (MULTIVITAMIN WITH MINERALS) TABS Take 1 tablet by mouth daily.      . ondansetron (ZOFRAN-ODT) 4 MG disintegrating tablet Take 4 mg by mouth every 12 (twelve) hours as needed for nausea.      Marland Kitchen oxycodone (OXY-IR) 5 MG capsule Take 5 mg by mouth every 6 (six) hours as needed for pain.      . pantoprazole (PROTONIX) 40 MG tablet Take 40 mg by mouth. Take 1 tablet (40 mg total) by mouth Two (2) times a day.      Marland Kitchen PRESCRIPTION MEDICATION Inject into the vein every 21 ( twenty-one) days. Kyprolis and Cytoxan infusion every 3 weeks (Monday and Tuesday)      . Probiotic Product (ALIGN) 4 MG CAPS Take 1 capsule by mouth daily.      . prochlorperazine (COMPAZINE) 10 MG tablet 10 mg every 6 (six) hours as needed.       . warfarin (COUMADIN) 2 MG tablet Take 1 tablet (2 mg total) by mouth daily.  30 tablet  0  . zolendronic acid (ZOMETA) 4 MG/5ML injection Inject 4 mg into the vein every 28 (twenty-eight) days.      . temazepam (RESTORIL) 15 MG capsule Take 1 capsule (15 mg total) by mouth at bedtime. Take 1 capsule (15 mg total) by mouth nightly as needed for sleep.  30 capsule  0   No current facility-administered medications for this visit.   Facility-Administered Medications Ordered in Other Visits  Medication Dose Route Frequency Provider Last Rate Last Dose  . ondansetron (ZOFRAN) tablet 8 mg  8 mg Oral Once Si Gaul, MD      . sodium chloride 0.9 % injection 10 mL  10 mL Intracatheter PRN Si Gaul, MD   10 mL at 06/11/13 1616    SURGICAL HISTORY:  Past Surgical History  Procedure Laterality Date  . Total knee replaced  2000    Left knee  . Appendectomy    . Colonoscopy  2009  . Esophagogastroduodenoscopy  2009    mild inflammation at esophagogastric junction  . Excision of melanoma      back ( x3)  . Great toe surgery      bilateral  . Inguinal hernia repair      left, x 2  . Total knee arthroplasty  06/08/2011     Procedure: TOTAL KNEE ARTHROPLASTY;  Surgeon: Loreta Ave, MD;  Location: [facility] OR;  Service: Orthopedics;  Laterality: Right;  osteonics  . [place]  10/2012    REVIEW  OF SYSTEMS:  A comprehensive review of systems was negative except for: Integument/breast: positive for rash   PHYSICAL EXAMINATION: General appearance: alert, cooperative and no distress Head: Normocephalic, without obvious abnormality, atraumatic Neck: no adenopathy, no JVD, supple, symmetrical, trachea midline and thyroid not enlarged, symmetric, no tenderness/mass/nodules Lymph nodes: Cervical, supraclavicular, and axillary nodes normal. Resp: clear to auscultation bilaterally Back: symmetric, no curvature. ROM normal. No CVA tenderness. Cardio: regular rate and rhythm, S1, S2 normal, no murmur, click, rub or gallop GI: soft, non-tender; bowel sounds normal; no masses,  no organomegaly Extremities: extremities normal, atraumatic, no cyanosis or edema Neurologic: Alert and oriented X 3, normal strength and tone. Normal symmetric reflexes. Normal coordination and gait  ECOG PERFORMANCE STATUS: 1 - Symptomatic but completely ambulatory  Blood pressure 118/69, pulse 61, temperature 97.7 F (36.5 C), temperature source Oral, resp. rate 20, height $RemoveBe'5\' 9"'[id number]$  (1.753 m), weight 213 lb (96.616 kg).  LABORATORY DATA: Lab Results  Component Value Date   WBC 3.1* 12/18/2013   HGB 13.1 12/18/2013   HCT 39.0 12/18/2013   MCV 85.7 12/18/2013   PLT 163 12/18/2013      Chemistry      Component Value Date/Time   NA 140 12/18/2013 0803   NA 141 11/18/2013 1045   K 4.2 12/18/2013 0803   K 3.9 11/18/2013 1045   CL 106 11/18/2013 1045   CL 109* 12/31/2012 0759   CO2 25 12/18/2013 0803   CO2 26 11/18/2013 1045   BUN 12.0 12/18/2013 0803   BUN 10 11/18/2013 1045   CREATININE 1.0 12/18/2013 0803   CREATININE 0.97 11/18/2013 1045   CREATININE 1.10 01/29/2013 0200      Component Value Date/Time   CALCIUM 9.1 12/18/2013 0803   CALCIUM 9.0  11/18/2013 1045   ALKPHOS 55 12/18/2013 0803   ALKPHOS 40 11/18/2013 1045   AST 20 12/18/2013 0803   AST 25 11/18/2013 1045   ALT 23 12/18/2013 0803   ALT 33 11/18/2013 1045   BILITOT 0.76 12/18/2013 0803   BILITOT 0.4 11/18/2013 1045       RADIOGRAPHIC STUDIES: No results found.  ASSESSMENT AND PLAN: This is a very pleasant 73 years old white male with history of multiple myeloma status post induction chemotherapy with Velcade, Revlimid and dexamethasone followed by treatment with Carfilzomib, cyclophosphamide and dexamethasone with significant improvement in his disease followed by autologous peripheral blood stem cell transplant at [facility] on 03/20/2013. This was followed by 2 cycles of consolidation chemotherapy with Carfilzomib, Cytoxan and dexamethasone, and he tolerated it fairly well. He completed maintenance Revlimid 10 mg by mouth daily for the last 3 months and tolerating it fairly well. He started maintenance Revlimid with 15 mg by mouth daily last week and tolerating it well. I recommended for the patient to continue his treatment with Revlimid as prescribed. For the skin rash, I will order Medrol Dosepak He will continue treatment with Zometa every 2 months. He would come back for followup visit in one month for reevaluation with repeat CBC, comprehensive metabolic panel and LDH. He was advised to call immediately if he has any concerning symptoms in the interval. The patient voices understanding of current disease status and treatment options and is in agreement with the current care plan.  All questions were answered. The patient knows to call the clinic with any problems, questions or concerns. We can certainly see the patient much sooner if necessary.  Disclaimer: This note was dictated with voice recognition software. Similar  sounding words can inadvertently be transcribed and may not be corrected upon review.

## 2013-12-18 NOTE — Addendum Note (Signed)
Addended by: Curt Bears on: 12/18/2013 02:33 PM   Modules accepted: Orders

## 2013-12-26 ENCOUNTER — Telehealth: Payer: Self-pay | Admitting: Medical Oncology

## 2013-12-26 DIAGNOSIS — C9000 Multiple myeloma not having achieved remission: Secondary | ICD-10-CM

## 2013-12-26 MED ORDER — LENALIDOMIDE 15 MG PO CAPS: 15.0000 mg | ORAL_CAPSULE | Freq: Every day | ORAL | Status: DC

## 2013-12-26 NOTE — Telephone Encounter (Signed)
Revlimid refill faxed to [facility]

## 2013-12-30 ENCOUNTER — Other Ambulatory Visit: Payer: Self-pay | Admitting: *Deleted

## 2013-12-30 DIAGNOSIS — C9000 Multiple myeloma not having achieved remission: Secondary | ICD-10-CM

## 2013-12-30 MED ORDER — WARFARIN SODIUM 2 MG PO TABS: 2.0000 mg | ORAL_TABLET | Freq: Every day | ORAL | Status: DC

## 2014-01-08 DIAGNOSIS — I2699 Other pulmonary embolism without acute cor pulmonale: Secondary | ICD-10-CM

## 2014-01-08 DIAGNOSIS — I82409 Acute embolism and thrombosis of unspecified deep veins of unspecified lower extremity: Secondary | ICD-10-CM

## 2014-01-08 HISTORY — DX: Other pulmonary embolism without acute cor pulmonale: I26.99

## 2014-01-08 HISTORY — DX: Acute embolism and thrombosis of unspecified deep veins of unspecified lower extremity: I82.409

## 2014-01-21 ENCOUNTER — Other Ambulatory Visit: Payer: Self-pay | Admitting: Medical Oncology

## 2014-01-21 ENCOUNTER — Telehealth: Payer: Self-pay | Admitting: Internal Medicine

## 2014-01-21 ENCOUNTER — Ambulatory Visit (HOSPITAL_BASED_OUTPATIENT_CLINIC_OR_DEPARTMENT_OTHER): Payer: Medicare Other | Admitting: Internal Medicine

## 2014-01-21 ENCOUNTER — Other Ambulatory Visit (HOSPITAL_BASED_OUTPATIENT_CLINIC_OR_DEPARTMENT_OTHER): Payer: Medicare Other

## 2014-01-21 VITALS — BP 112/64 | HR 67 | Temp 98.5°F | Resp 18 | Ht 69.0 in | Wt 215.0 lb

## 2014-01-21 DIAGNOSIS — C9000 Multiple myeloma not having achieved remission: Secondary | ICD-10-CM

## 2014-01-21 DIAGNOSIS — R21 Rash and other nonspecific skin eruption: Secondary | ICD-10-CM

## 2014-01-21 LAB — COMPREHENSIVE METABOLIC PANEL (CC13)
ALT: 23 U/L (ref 0–55)
AST: 22 U/L (ref 5–34)
Albumin: 3.5 g/dL (ref 3.5–5.0)
Alkaline Phosphatase: 51 U/L (ref 40–150)
Anion Gap: 10 mEq/L (ref 3–11)
BILIRUBIN TOTAL: 0.87 mg/dL (ref 0.20–1.20)
BUN: 10.5 mg/dL (ref 7.0–26.0)
CO2: 24 mEq/L (ref 22–29)
CREATININE: 1 mg/dL (ref 0.7–1.3)
Calcium: 9.3 mg/dL (ref 8.4–10.4)
Chloride: 106 mEq/L (ref 98–109)
Glucose: 113 mg/dl (ref 70–140)
Potassium: 4.1 mEq/L (ref 3.5–5.1)
SODIUM: 141 meq/L (ref 136–145)
TOTAL PROTEIN: 6.5 g/dL (ref 6.4–8.3)

## 2014-01-21 LAB — LACTATE DEHYDROGENASE (CC13): LDH: 157 U/L (ref 125–245)

## 2014-01-21 LAB — CBC WITH DIFFERENTIAL/PLATELET
BASO%: 6.5 % — AB (ref 0.0–2.0)
Basophils Absolute: 0.2 10*3/uL — ABNORMAL HIGH (ref 0.0–0.1)
EOS%: 11.2 % — ABNORMAL HIGH (ref 0.0–7.0)
Eosinophils Absolute: 0.4 10*3/uL (ref 0.0–0.5)
HCT: 38.4 % (ref 38.4–49.9)
HGB: 12.8 g/dL — ABNORMAL LOW (ref 13.0–17.1)
LYMPH%: 23.1 % (ref 14.0–49.0)
MCH: 29.4 pg (ref 27.2–33.4)
MCHC: 33.3 g/dL (ref 32.0–36.0)
MCV: 88.1 fL (ref 79.3–98.0)
MONO#: 0.3 10*3/uL (ref 0.1–0.9)
MONO%: 8.9 % (ref 0.0–14.0)
NEUT%: 50.3 % (ref 39.0–75.0)
NEUTROS ABS: 1.7 10*3/uL (ref 1.5–6.5)
PLATELETS: 156 10*3/uL (ref 140–400)
RBC: 4.36 10*6/uL (ref 4.20–5.82)
RDW: 18.3 % — AB (ref 11.0–14.6)
WBC: 3.4 10*3/uL — AB (ref 4.0–10.3)
lymph#: 0.8 10*3/uL — ABNORMAL LOW (ref 0.9–3.3)
nRBC: 0 % (ref 0–0)

## 2014-01-21 MED ORDER — METHYLPREDNISOLONE (PAK) 4 MG PO TABS: ORAL_TABLET | ORAL | Status: DC

## 2014-01-21 NOTE — Telephone Encounter (Signed)
gv pt appt schedule for july/aug. message to MM re 8/5 appt w/AJ - MM full.

## 2014-01-21 NOTE — Telephone Encounter (Signed)
Medrol dose pak refilled.

## 2014-01-21 NOTE — Progress Notes (Signed)
[facility] [phone]   Fax:(336) Shepherd, MD [place] [address]  DIAGNOSIS: Stage IIA multiple myeloma diagnosed in July of 2013.   PRIOR THERAPY:  1) Systemic chemotherapy with subcutaneous Velcade 1.3 mg/M2 on days 1, 4, 8 and 11 every 3 weeks in addition to Revlimid 25 mg by mouth daily for 14 days every 3 weeks and dexamethasone 40 mg on a weekly basis. Status post 4 planned cycles.  2) Systemic chemotherapy with subcutaneous Velcade 1.3 mg/M2 on days 1, 4, 8 and 11 every 3 weeks in addition to Revlimid 25 mg by mouth daily for 14 days every 3 weeks and dexamethasone 40 mg on a weekly basis. 4 more cycles are planned and he is status post 4 cycles of the second set of 4 cycles, now status post a total of 10 cycles. 3) Systemic chemotherapy with Carfilzomib 36 mg/M2 on days 1, 2, 8, 9, 15 and 16 in addition to cyclophosphamide 300 mg/M2 IV on days 1, 8 and 15 as well as dexamethasone on days 1, 8, 15 and 22 every 4 weeks. Status post 4 cycles. 4) Zometa 4 mg IV every month. 5) autologous peripheral blood stem cell transplant on 03/20/2013 at [facility]. 6) Consolidation chemotherapy with Carfilzomib 36 mg/M2 on days 1, 2, 8, 9, 15 and 16 every 4 weeks in addition to cyclophosphamide 300 mg/M2 on days 1, 8 and 15 as well as dexamethasone 40 mg on a weekly basis every 4 weeks. Status post 2 cycles. First cycle 06/11/2013. 7) maintenance therapy with Revlimid 10 mg by mouth daily status post 3 months of treatment.    CURRENT THERAPY:  1) Maintenance therapy with Revlimid 15 mg by mouth daily started 11/09/2013. 2) Zometa 4 mg IV every 2 months.   INTERVAL HISTORY: [name] 73 y.o. male returns to the clinic today for followup visit. He started his treatment with Revlimid 15 mg by mouth daily you on 11/09/2013. He is tolerating his treatment fairly well with no significant adverse  effects except for recurrent skin rash on the upper and lower extremities mainly on the sun exposed area. It improved after treatment with steroids but trachea is again soft and the patient and has a lot of sun exposure. He also complains of dry skin. He denied having any significant fatigue or weakness. He denied having any fever or chills. He has no nausea or vomiting.  He denied having any significant chest pain, shortness of breath, cough or hemoptysis.   MEDICAL HISTORY: Past Medical History  Diagnosis Date  . GERD (gastroesophageal reflux disease)   . Adenomatous polyp of colon 1996  . Arthritis     Osteoarthritis right knee, spine, wrist  . Benign prostatic hypertrophy     (Dr. Karsten Ro)  . History of chronic prostatitis   . History of melanoma     Back (Dr. Wilhemina Bonito); early melanoma R arm 03/2011  . Diverticulosis   . Arthritis of hand, right     Right thumb (Dr. Daylene Katayama)  . Degenerative disc disease   . Hemorrhoids   . Erosive esophagitis   . Back ache     r/o Multiple Myeloma  . Multiple myeloma 2013    ALLERGIES:  is allergic to penicillins.  MEDICATIONS:  Current Outpatient Prescriptions  Medication Sig Dispense Refill  . acetaminophen (TYLENOL) 325 MG tablet Take 650 mg by mouth. prn      .  ascorbic acid (VITAMIN C) 500 MG tablet Take 500 mg by mouth daily.      . B Complex-C (B-COMPLEX WITH VITAMIN C) tablet Take 1 tablet by mouth daily.      . celecoxib (CELEBREX) 200 MG capsule Take 200 mg by mouth daily as needed for pain.      . cholecalciferol (VITAMIN D) 1000 UNITS tablet Take 1,000 Units by mouth daily.      . fish oil-omega-3 fatty acids 1000 MG capsule Take 1 g by mouth daily.       Marland Kitchen lenalidomide (REVLIMID) 15 MG capsule Take 1 capsule (15 mg total) by mouth daily.  28 capsule  0  . methocarbamol (ROBAXIN) 500 MG tablet Take 1 tablet (500 mg total) by mouth at bedtime as needed for muscle spasms.  20 tablet  0  . methylPREDNIsolone (MEDROL DOSPACK) 4 MG  tablet follow package directions  21 tablet  0  . Multiple Vitamin (MULTIVITAMIN WITH MINERALS) TABS Take 1 tablet by mouth daily.      . pantoprazole (PROTONIX) 40 MG tablet Take 40 mg by mouth. Take 1 tablet (40 mg total) by mouth Two (2) times a day.      . Probiotic Product (ALIGN) 4 MG CAPS Take 1 capsule by mouth daily.      Marland Kitchen warfarin (COUMADIN) 2 MG tablet Take 1 tablet (2 mg total) by mouth daily.  30 tablet  1  . zolendronic acid (ZOMETA) 4 MG/5ML injection Inject 4 mg into the vein every 28 (twenty-eight) days.      Marland Kitchen HYDROcodone-acetaminophen (NORCO) 7.5-325 MG per tablet Take 1 tablet by mouth every 6 (six) hours as needed.  30 tablet  0  . ondansetron (ZOFRAN-ODT) 4 MG disintegrating tablet Take 4 mg by mouth every 12 (twelve) hours as needed for nausea.      Marland Kitchen oxycodone (OXY-IR) 5 MG capsule Take 5 mg by mouth every 6 (six) hours as needed for pain.      Marland Kitchen PRESCRIPTION MEDICATION Inject into the vein every 21 ( twenty-one) days. Kyprolis and Cytoxan infusion every 3 weeks (Monday and Tuesday)      . prochlorperazine (COMPAZINE) 10 MG tablet 10 mg every 6 (six) hours as needed.       . temazepam (RESTORIL) 15 MG capsule Take 1 capsule (15 mg total) by mouth at bedtime. Take 1 capsule (15 mg total) by mouth nightly as needed for sleep.  30 capsule  0   No current facility-administered medications for this visit.   Facility-Administered Medications Ordered in Other Visits  Medication Dose Route Frequency Provider Last Rate Last Dose  . ondansetron (ZOFRAN) tablet 8 mg  8 mg Oral Once Curt Bears, MD      . sodium chloride 0.9 % injection 10 mL  10 mL Intracatheter PRN Curt Bears, MD   10 mL at 06/11/13 1616    SURGICAL HISTORY:  Past Surgical History  Procedure Laterality Date  . Total knee replaced  2000    Left knee  . Appendectomy    . Colonoscopy  2009  . Esophagogastroduodenoscopy  2009    mild inflammation at esophagogastric junction  . Excision of melanoma        back ( x3)  . Great toe surgery      bilateral  . Inguinal hernia repair      left, x 2  . Total knee arthroplasty  06/08/2011    Procedure: TOTAL KNEE ARTHROPLASTY;  Surgeon: Ninetta Lights, MD;  Location: [facility];  Service: Orthopedics;  Laterality: Right;  osteonics  . [place]  10/2012    REVIEW OF SYSTEMS:  A comprehensive review of systems was negative except for: Integument/breast: positive for rash   PHYSICAL EXAMINATION: General appearance: alert, cooperative and no distress Head: Normocephalic, without obvious abnormality, atraumatic Neck: no adenopathy, no JVD, supple, symmetrical, trachea midline and thyroid not enlarged, symmetric, no tenderness/mass/nodules Lymph nodes: Cervical, supraclavicular, and axillary nodes normal. Resp: clear to auscultation bilaterally Back: symmetric, no curvature. ROM normal. No CVA tenderness. Cardio: regular rate and rhythm, S1, S2 normal, no murmur, click, rub or gallop GI: soft, non-tender; bowel sounds normal; no masses,  no organomegaly Extremities: extremities normal, atraumatic, no cyanosis or edema Neurologic: Alert and oriented X 3, normal strength and tone. Normal symmetric reflexes. Normal coordination and gait  ECOG PERFORMANCE STATUS: 1 - Symptomatic but completely ambulatory  Blood pressure 112/64, pulse 67, temperature 98.5 F (36.9 C), temperature source Oral, resp. rate 18, height $RemoveBe'5\' 9"'[id number]$  (1.753 m), weight 215 lb (97.523 kg).  LABORATORY DATA: Lab Results  Component Value Date   WBC 3.4* 01/21/2014   HGB 12.8* 01/21/2014   HCT 38.4 01/21/2014   MCV 88.1 01/21/2014   PLT 156 01/21/2014      Chemistry      Component Value Date/Time   NA 141 01/21/2014 1004   NA 141 11/18/2013 1045   K 4.1 01/21/2014 1004   K 3.9 11/18/2013 1045   CL 106 11/18/2013 1045   CL 109* 12/31/2012 0759   CO2 24 01/21/2014 1004   CO2 26 11/18/2013 1045   BUN 10.5 01/21/2014 1004   BUN 10 11/18/2013 1045   CREATININE 1.0 01/21/2014 1004    CREATININE 0.97 11/18/2013 1045   CREATININE 1.10 01/29/2013 0200      Component Value Date/Time   CALCIUM 9.3 01/21/2014 1004   CALCIUM 9.0 11/18/2013 1045   ALKPHOS 51 01/21/2014 1004   ALKPHOS 40 11/18/2013 1045   AST 22 01/21/2014 1004   AST 25 11/18/2013 1045   ALT 23 01/21/2014 1004   ALT 33 11/18/2013 1045   BILITOT 0.87 01/21/2014 1004   BILITOT 0.4 11/18/2013 1045       RADIOGRAPHIC STUDIES: No results found.  ASSESSMENT AND PLAN: This is a very pleasant 73 years old white male with history of multiple myeloma status post induction chemotherapy with Velcade, Revlimid and dexamethasone followed by treatment with Carfilzomib, cyclophosphamide and dexamethasone with significant improvement in his disease followed by autologous peripheral blood stem cell transplant at [facility] on 03/20/2013. This was followed by 2 cycles of consolidation chemotherapy with Carfilzomib, Cytoxan and dexamethasone, and he tolerated it fairly well. He completed maintenance Revlimid 10 mg by mouth daily for the last 3 months and tolerating it fairly well. He started maintenance Revlimid with 15 mg by mouth daily last week and tolerating it well except for development of skin rash which usually associated with sun exposure. I will start the patient on Medrol Dosepak. I recommended for the patient to continue his treatment with Revlimid as prescribed. He would come back for followup visit in 3 weeks after repeat myeloma panel for evaluation of his disease. He will continue treatment with Zometa every 2 months. He was advised to call immediately if he has any concerning symptoms in the interval. The patient voices understanding of current disease status and treatment options and is in agreement with the current care plan.  All questions were answered. The patient knows  to call the clinic with any problems, questions or concerns. We can certainly see the patient much sooner if necessary.  Disclaimer: This  note was dictated with voice recognition software. Similar sounding words can inadvertently be transcribed and may not be corrected upon review.

## 2014-01-22 MED ORDER — DEXAMETHASONE SODIUM PHOSPHATE 10 MG/ML IJ SOLN
INTRAMUSCULAR | Status: AC
  Filled 2014-01-22: qty 1

## 2014-01-22 MED ORDER — ONDANSETRON 8 MG/NS 50 ML IVPB
INTRAVENOUS | Status: AC
  Filled 2014-01-22: qty 8

## 2014-01-23 ENCOUNTER — Telehealth: Payer: Self-pay | Admitting: Medical Oncology

## 2014-01-23 DIAGNOSIS — C9000 Multiple myeloma not having achieved remission: Secondary | ICD-10-CM

## 2014-01-23 MED ORDER — LENALIDOMIDE 15 MG PO CAPS: 15.0000 mg | ORAL_CAPSULE | Freq: Every day | ORAL | Status: DC

## 2014-01-23 NOTE — Telephone Encounter (Signed)
revlimid refill faxed to [facility].

## 2014-01-30 ENCOUNTER — Telehealth: Payer: Self-pay | Admitting: Internal Medicine

## 2014-01-30 NOTE — Telephone Encounter (Signed)
returned pt call adn r/s appt per pt request...pt ok and aware

## 2014-02-04 ENCOUNTER — Encounter: Payer: Self-pay | Admitting: Family Medicine

## 2014-02-04 ENCOUNTER — Other Ambulatory Visit: Payer: Medicare Other

## 2014-02-05 ENCOUNTER — Ambulatory Visit (INDEPENDENT_AMBULATORY_CARE_PROVIDER_SITE_OTHER): Payer: Medicare Other | Admitting: Family Medicine

## 2014-02-05 ENCOUNTER — Other Ambulatory Visit (HOSPITAL_BASED_OUTPATIENT_CLINIC_OR_DEPARTMENT_OTHER): Payer: Medicare Other

## 2014-02-05 ENCOUNTER — Encounter: Payer: Self-pay | Admitting: Family Medicine

## 2014-02-05 VITALS — BP 96/60 | HR 60 | Ht 69.0 in | Wt 216.0 lb

## 2014-02-05 DIAGNOSIS — C9000 Multiple myeloma not having achieved remission: Secondary | ICD-10-CM

## 2014-02-05 DIAGNOSIS — Z7901 Long term (current) use of anticoagulants: Secondary | ICD-10-CM

## 2014-02-05 DIAGNOSIS — M79661 Pain in right lower leg: Secondary | ICD-10-CM

## 2014-02-05 DIAGNOSIS — M79609 Pain in unspecified limb: Secondary | ICD-10-CM

## 2014-02-05 DIAGNOSIS — I809 Phlebitis and thrombophlebitis of unspecified site: Secondary | ICD-10-CM

## 2014-02-05 LAB — COMPREHENSIVE METABOLIC PANEL (CC13)
ALT: 19 U/L (ref 0–55)
AST: 19 U/L (ref 5–34)
Albumin: 3.2 g/dL — ABNORMAL LOW (ref 3.5–5.0)
Alkaline Phosphatase: 57 U/L (ref 40–150)
Anion Gap: 7 mEq/L (ref 3–11)
BUN: 10.3 mg/dL (ref 7.0–26.0)
CALCIUM: 8.8 mg/dL (ref 8.4–10.4)
CO2: 25 mEq/L (ref 22–29)
Chloride: 110 mEq/L — ABNORMAL HIGH (ref 98–109)
Creatinine: 1 mg/dL (ref 0.7–1.3)
GLUCOSE: 109 mg/dL (ref 70–140)
Potassium: 3.7 mEq/L (ref 3.5–5.1)
Sodium: 141 mEq/L (ref 136–145)
Total Bilirubin: 0.77 mg/dL (ref 0.20–1.20)
Total Protein: 6.3 g/dL — ABNORMAL LOW (ref 6.4–8.3)

## 2014-02-05 LAB — CBC WITH DIFFERENTIAL/PLATELET
BASO%: 4.5 % — AB (ref 0.0–2.0)
Basophils Absolute: 0.2 10*3/uL — ABNORMAL HIGH (ref 0.0–0.1)
EOS%: 9.2 % — AB (ref 0.0–7.0)
Eosinophils Absolute: 0.4 10*3/uL (ref 0.0–0.5)
HEMATOCRIT: 37.5 % — AB (ref 38.4–49.9)
HGB: 12.6 g/dL — ABNORMAL LOW (ref 13.0–17.1)
LYMPH%: 18.8 % (ref 14.0–49.0)
MCH: 30.1 pg (ref 27.2–33.4)
MCHC: 33.6 g/dL (ref 32.0–36.0)
MCV: 89.7 fL (ref 79.3–98.0)
MONO#: 0.4 10*3/uL (ref 0.1–0.9)
MONO%: 9.4 % (ref 0.0–14.0)
NEUT#: 2.2 10*3/uL (ref 1.5–6.5)
NEUT%: 58.1 % (ref 39.0–75.0)
PLATELETS: 154 10*3/uL (ref 140–400)
RBC: 4.18 10*6/uL — ABNORMAL LOW (ref 4.20–5.82)
RDW: 17.9 % — ABNORMAL HIGH (ref 11.0–14.6)
WBC: 3.8 10*3/uL — ABNORMAL LOW (ref 4.0–10.3)
lymph#: 0.7 10*3/uL — ABNORMAL LOW (ref 0.9–3.3)
nRBC: 0 % (ref 0–0)

## 2014-02-05 LAB — PROTIME-INR
INR: 1.22 (ref <1.50)
PROTHROMBIN TIME: 15.4 s — AB (ref 11.6–15.2)

## 2014-02-05 LAB — LACTATE DEHYDROGENASE (CC13): LDH: 136 U/L (ref 125–245)

## 2014-02-05 LAB — D-DIMER, QUANTITATIVE: D-Dimer, Quant: 1.71 ug/mL-FEU — ABNORMAL HIGH (ref 0.00–0.48)

## 2014-02-05 NOTE — Patient Instructions (Signed)
We will contact you with your results later today. Apply warm compresses and keep the leg elevated. I will likely be increasing your warfarin dose.  Phlebitis Phlebitis is soreness and swelling (inflammation) of a vein. This can occur in your arms, legs, or torso (trunk), as well as deeper inside your body. Phlebitis is usually not serious when it occurs close to the surface of the body. However, it can cause serious problems when it occurs in a vein deeper inside the body. CAUSES  Phlebitis can be triggered by various things, including:   Reduced blood flow through your veins. This can happen with:  Bed rest over a long period.  Long-distance travel.  Injury.  Surgery.  Being overweight (obese) or pregnant.  Having an IV tube put in the vein and getting certain medicines through the vein.  Cancer and cancer treatment.  Use of illegal drugs taken through the vein.  Inflammatory diseases.  Inherited (genetic) diseases that increase the risk of blood clots.  Hormone therapy, such as birth control pills. SIGNS AND SYMPTOMS   Red, tender, swollen, and painful area on your skin. Usually, the area will be long and narrow.  Firmness along the center of the affected area. This can indicate that a blood clot has formed.  Low-grade fever. DIAGNOSIS  A health care provider can usually diagnose phlebitis by examining the affected area and asking about your symptoms. To check for infection or blood clots, your health care provider may order blood tests or an ultrasound exam of the area. Blood tests and your family history may also indicate if you have an underlying genetic disease that causes blood clots. Occasionally, a piece of tissue is taken from the body (biopsy sample) if an unusual cause of phlebitis is suspected. TREATMENT  Treatment will vary depending on the severity of the condition and the area of the body affected. Treatment may include:  Use of a warm compress or heating  pad.  Use of compression stockings or bandages.  Anti-inflammatory medicines.  Removal of any IV tube that may be causing the problem.  Medicines that kill germs (antibiotics) if an infection is present.  Blood-thinning medicines if a blood clot is suspected or present.  In rare cases, surgery may be needed to remove damaged sections of vein. HOME CARE INSTRUCTIONS   Only take over-the-counter or prescription medicines as directed by your health care provider. Take all medicines exactly as prescribed.  Raise (elevate) the affected area above the level of your heart as directed by your health care provider.  Apply a warm compress or heating pad to the affected area as directed by your health care provider. Do not sleep with the heating pad.  Use compression stockings or bandages as directed. These will speed healing and prevent the condition from coming back.  If you are on blood thinners:  Get follow-up blood tests as directed by your health care provider.  Check with your health care provider before using any new medicines.  Carry a medical alert card or wear your medical alert jewelry to show that you are on blood thinners.  For phlebitis in the legs:  Avoid prolonged standing or bed rest.  Keep your legs moving. Raise your legs when sitting or lying.  Do not smoke.  Women, particularly those over the age of 56, should consider the risks and benefits of taking the contraceptive pill. This kind of hormone treatment can increase your risk for blood clots.  Follow up with your health care  provider as directed. SEEK MEDICAL CARE IF:   You have unusual bruising or any bleeding problems.  Your swelling or pain in the affected area is not improving.  You are on anti-inflammatory medicine, and you develop belly (abdominal) pain. SEEK IMMEDIATE MEDICAL CARE IF:   You have a sudden onset of chest pain or difficulty breathing.  You have a fever or persistent symptoms for  more than 2-3 days.  You have a fever and your symptoms suddenly get worse. MAKE SURE YOU:  Understand these instructions.  Will watch your condition.  Will get help right away if you are not doing well or get worse. Document Released: 06/21/2001 Document Revised: 04/17/2013 Document Reviewed: 03/04/2013 [employer] [facility] Patient Information 2015 [place], [place]. This information is not intended to replace advice given to you by your health care provider. Make sure you discuss any questions you have with your health care provider.

## 2014-02-05 NOTE — Progress Notes (Signed)
Chief Complaint  Patient presents with  . Leg Pain    last Thursday evening after walking he started having right foot pain[place] calf really starting hurting. Iced x 4 days without any relief. Concerned about blood clot.    6 days ago he walked a lot.  That night he had some pain in his heel and the bottom of his foot, foot started to swell a little. He thought it might just be some plantar fasciitis.  The next day he walked for about an hour.  Over the next few days, when he was at the [place], he had swelling and pain in the right calf.  The pain in on the medial aspect of the calf.  He has been elevating the right leg, and icing the calf, but this hasn't helped. He has also been taking celebrex for the last couple of days without improvement. It has remained the same--swollen and painful at the right medial calf.  He feels a knot running along the medial calf.  He denies posterior calf pain, swelling that has progressively gotten worse.  He denies shortness of breath or chest pain.  Denies any fevers.  He is on revlimid, and is concerned about the possibility of blood clots, as this is a potential complication/risk with this medication. He is on warfarin, at low dose, for prevention of blood clots, taking since February.  He hasn't had PE or DVT, nor afib.  He had labs drawn this afternoon at [facility] for the oncologist, with f/u appointment scheduled with them next week.--labs reviewed, no Pt/INR's have been monitored. He has had hives twice, needing treatment with steroids, related to the revlimid. No other complications.  Past Medical History  Diagnosis Date  . GERD (gastroesophageal reflux disease)   . Adenomatous polyp of colon 1996  . Arthritis     Osteoarthritis right knee, spine, wrist  . Benign prostatic hypertrophy     (Dr. Karsten Ro)  . History of chronic prostatitis   . History of melanoma     Back (Dr. Wilhemina Bonito); early melanoma R arm 03/2011  .  Diverticulosis   . Arthritis of hand, right     Right thumb (Dr. Daylene Katayama)  . Degenerative disc disease   . Hemorrhoids   . Erosive esophagitis   . Back ache     r/o Multiple Myeloma  . Multiple myeloma 2013   Past Surgical History  Procedure Laterality Date  . Total knee replaced  2000    Left knee  . Appendectomy    . Colonoscopy  2009  . Esophagogastroduodenoscopy  2009    mild inflammation at esophagogastric junction  . Excision of melanoma      back ( x3)  . Great toe surgery      bilateral  . Inguinal hernia repair      left, x 2  . Total knee arthroplasty  06/08/2011    Procedure: TOTAL KNEE ARTHROPLASTY;  Surgeon: Ninetta Lights, MD;  Location: [facility];  Service: Orthopedics;  Laterality: Right;  osteonics  . [place]  10/2012   History   Social History  . Marital Status: Married    Spouse Name: N/A    Number of Children: 2  . Years of Education: N/A   Occupational History  . mortgage banking retired    Social History Main Topics  . Smoking status: Former Smoker -- 3.00 packs/day for 10 years    Types: Cigarettes    Quit  date: 07/11/1976  . Smokeless tobacco: Never Used     Comment: 3 PPD x 6 years  . Alcohol Use: 1.5 oz/week    3 drink(s) per week     Comment: 2 glasses of wine daily  . Drug Use: No  . Sexual Activity: Yes    Partners: Female   Other Topics Concern  . Not on file   Social History Narrative  . No narrative on file   Outpatient Encounter Prescriptions as of 02/05/2014  Medication Sig Note  . ascorbic acid (VITAMIN C) 500 MG tablet Take 500 mg by mouth daily.   . B Complex-C (B-COMPLEX WITH VITAMIN C) tablet Take 1 tablet by mouth daily.   . celecoxib (CELEBREX) 200 MG capsule Take 200 mg by mouth daily as needed for pain. 02/05/2014: He has been taking one daily for the last few days for calf pain  . cholecalciferol (VITAMIN D) 1000 UNITS tablet Take 1,000 Units by mouth daily.   . fish oil-omega-3 fatty acids 1000 MG capsule Take  1 g by mouth daily.    Marland Kitchen HYDROcodone-acetaminophen (NORCO) 7.5-325 MG per tablet Take 1 tablet by mouth every 6 (six) hours as needed. 01/21/2014: Not needed  . lenalidomide (REVLIMID) 15 MG capsule Take 1 capsule (15 mg total) by mouth daily.   . Multiple Vitamin (MULTIVITAMIN WITH MINERALS) TABS Take 1 tablet by mouth daily.   . pantoprazole (PROTONIX) 40 MG tablet Take 40 mg by mouth. Take 1 tablet (40 mg total) by mouth Two (2) times a day.   . Probiotic Product (ALIGN) 4 MG CAPS Take 1 capsule by mouth daily.   . temazepam (RESTORIL) 15 MG capsule Take 1 capsule (15 mg total) by mouth at bedtime. Take 1 capsule (15 mg total) by mouth nightly as needed for sleep.   Marland Kitchen warfarin (COUMADIN) 2 MG tablet Take 1 tablet (2 mg total) by mouth daily.   Marland Kitchen zolendronic acid (ZOMETA) 4 MG/5ML injection Inject 4 mg into the vein every 28 (twenty-eight) days. 01/17/2013: Last dose 01/14/13.  Marland Kitchen acetaminophen (TYLENOL) 325 MG tablet Take 650 mg by mouth. prn   . methocarbamol (ROBAXIN) 500 MG tablet Take 1 tablet (500 mg total) by mouth at bedtime as needed for muscle spasms.   . ondansetron (ZOFRAN-ODT) 4 MG disintegrating tablet Take 4 mg by mouth every 12 (twelve) hours as needed for nausea. 01/21/2014: Not needed  . oxycodone (OXY-IR) 5 MG capsule Take 5 mg by mouth every 6 (six) hours as needed for pain. 01/21/2014: Not needed  . PRESCRIPTION MEDICATION Inject into the vein every 21 ( twenty-one) days. Kyprolis and Cytoxan infusion every 3 weeks (Monday and Tuesday) 01/29/2013: Last on 01/22/13  . prochlorperazine (COMPAZINE) 10 MG tablet 10 mg every 6 (six) hours as needed.  01/21/2014: Not needed  . [DISCONTINUED] methylPREDNIsolone (MEDROL DOSPACK) 4 MG tablet follow package directions    Allergies  Allergen Reactions  . Penicillins     REACTION: rash   ROS:  Denies fevers, chills, headaches, dizziness, neuro symptoms.  Denies chest pain, palpitations, shortness of breath, nausea,vomiting, bowel changes.   Denies bleeding/bruising, skin lesions other than redness at calf with swelling, as noted in HPI. Denies other joint or muscle pains. Moods are good.  PHYSICAL EXAM: BP 96/60  Pulse 60  Ht 5\' 9"  (1.753 m)  Wt 216 lb (97.977 kg)  BMI 31.88 kg/m2 Well developed, well-appearing, pleasant male in no distress Heart: regular rate and rhythm Lungs: clear bilaterally Extremities: Right medial  calf--there is about 9-10 cm erythematous streak--it is warm to touch, and there is underlying palpable cord that is tender.  There is no significant pitting edema in the lower legs.  Calves are nontender, negative Homan sign, calf sizes symmetric 2+ arterial pulses   Lab Results  Component Value Date   WBC 3.8* 02/05/2014   HGB 12.6* 02/05/2014   HCT 37.5* 02/05/2014   MCV 89.7 02/05/2014   PLT 154 02/05/2014     Chemistry      Component Value Date/Time   NA 141 02/05/2014 1323   NA 141 11/18/2013 1045   K 3.7 02/05/2014 1323   K 3.9 11/18/2013 1045   CL 106 11/18/2013 1045   CL 109* 12/31/2012 0759   CO2 25 02/05/2014 1323   CO2 26 11/18/2013 1045   BUN 10.3 02/05/2014 1323   BUN 10 11/18/2013 1045   CREATININE 1.0 02/05/2014 1323   CREATININE 0.97 11/18/2013 1045   CREATININE 1.10 01/29/2013 0200      Component Value Date/Time   CALCIUM 8.8 02/05/2014 1323   CALCIUM 9.0 11/18/2013 1045   ALKPHOS 57 02/05/2014 1323   ALKPHOS 40 11/18/2013 1045   AST 19 02/05/2014 1323   AST 25 11/18/2013 1045   ALT 19 02/05/2014 1323   ALT 33 11/18/2013 1045   BILITOT 0.77 02/05/2014 1323   BILITOT 0.4 11/18/2013 1045     ASSESSMENT/PLAN:  Superficial thrombophlebitis - at risk for DVT.  Will check D-dimer, and if +, will get u/s of leg tomorrow.  Check PT/INR, and if +D-dimer, will increase dose to get therapeutic INR of 2-3 - Plan: D-dimer, quantitative  Long term (current) use of anticoagulants - Plan: Protime-INR  Right calf pain - Plan: D-dimer, quantitative  Multiple myeloma  Thrombophlebitis--advised to apply  warm compresses, keep leg elevated.  Compression when able to tolerate.  Addendum:  Lab Results  Component Value Date   DDIMER 1.71* 02/05/2014   Lab Results  Component Value Date   INR 1.22 02/05/2014   INR 0.96 10/30/2012   INR 1.68* 06/10/2011   Pt will be scheduled for duplex u/s of RLE tomorrow (Thurs); Pt advised to double up on his warfarin dose (take extra $RemoveBef'2mg'ZlDnMvWtVP$  tonight, and start $RemoveB'4mg'ucipnLab$  in the morning).  He will need PT/INR rechecked on Monday.  Will forward results to Dr. Earlie Server for him to f/u and address goals/length of treatment for anti-coagulation.    Send copy of note to Dr. Earlie Server

## 2014-02-06 ENCOUNTER — Other Ambulatory Visit (HOSPITAL_COMMUNITY): Payer: Self-pay | Admitting: Family Medicine

## 2014-02-06 ENCOUNTER — Ambulatory Visit (HOSPITAL_COMMUNITY)
Admission: RE | Admit: 2014-02-06 | Discharge: 2014-02-06 | Disposition: A | Discharge status: Home / Self Care | Payer: Medicare Other | Source: Ambulatory Visit | Attending: Family Medicine | Admitting: Family Medicine

## 2014-02-06 ENCOUNTER — Encounter: Payer: Self-pay | Admitting: Family Medicine

## 2014-02-06 ENCOUNTER — Telehealth: Payer: Self-pay | Admitting: Medical Oncology

## 2014-02-06 ENCOUNTER — Other Ambulatory Visit: Payer: Self-pay | Admitting: *Deleted

## 2014-02-06 DIAGNOSIS — I82819 Embolism and thrombosis of superficial veins of unspecified lower extremities: Secondary | ICD-10-CM

## 2014-02-06 DIAGNOSIS — I809 Phlebitis and thrombophlebitis of unspecified site: Secondary | ICD-10-CM

## 2014-02-06 DIAGNOSIS — C9000 Multiple myeloma not having achieved remission: Secondary | ICD-10-CM

## 2014-02-06 DIAGNOSIS — I80299 Phlebitis and thrombophlebitis of other deep vessels of unspecified lower extremity: Secondary | ICD-10-CM | POA: Insufficient documentation

## 2014-02-06 DIAGNOSIS — R609 Edema, unspecified: Secondary | ICD-10-CM

## 2014-02-06 DIAGNOSIS — I824Z9 Acute embolism and thrombosis of unspecified deep veins of unspecified distal lower extremity: Secondary | ICD-10-CM

## 2014-02-06 DIAGNOSIS — M79609 Pain in unspecified limb: Secondary | ICD-10-CM

## 2014-02-06 DIAGNOSIS — I82409 Acute embolism and thrombosis of unspecified deep veins of unspecified lower extremity: Secondary | ICD-10-CM | POA: Insufficient documentation

## 2014-02-06 DIAGNOSIS — R7989 Other specified abnormal findings of blood chemistry: Secondary | ICD-10-CM

## 2014-02-06 LAB — IGG, IGA, IGM
IGG (IMMUNOGLOBIN G), SERUM: 899 mg/dL (ref 650–1600)
IgA: 151 mg/dL (ref 68–379)
IgM, Serum: 29 mg/dL — ABNORMAL LOW (ref 41–251)

## 2014-02-06 NOTE — Telephone Encounter (Addendum)
Has DVT in right leg. He was instructed to double his coumadin.  Does he need to stop his Revlimid?

## 2014-02-06 NOTE — Progress Notes (Signed)
VASCULAR LAB PRELIMINARY  PRELIMINARY  PRELIMINARY  PRELIMINARY   Right lower extremity venous duplex completed.    Preliminary report:  Right:  DVT noted in a branch of the posterior tibial vein coursing from just above the ankle to mid cal. Also noted is a superficial thrombus of a branch of the greater saphenous vein coursing from approximately 3 inches above the ankle to the proximal calf.  No Baker's cyst.  Wetona Viramontes, RVS 02/06/2014, 9:53 AM

## 2014-02-07 ENCOUNTER — Ambulatory Visit: Payer: Medicare Other

## 2014-02-07 ENCOUNTER — Telehealth: Payer: Self-pay | Admitting: Medical Oncology

## 2014-02-07 ENCOUNTER — Ambulatory Visit (HOSPITAL_COMMUNITY)
Admission: RE | Admit: 2014-02-07 | Discharge: 2014-02-07 | Disposition: A | Discharge status: Home / Self Care | Payer: Medicare Other | Source: Ambulatory Visit | Attending: Physician Assistant | Admitting: Physician Assistant

## 2014-02-07 ENCOUNTER — Inpatient Hospital Stay (HOSPITAL_COMMUNITY)
Admission: AD | Admit: 2014-02-07 | Discharge: 2014-02-09 | DRG: 176 | Disposition: A | Discharge status: Home / Self Care | Payer: Medicare Other | Source: Ambulatory Visit | Attending: Internal Medicine | Admitting: Internal Medicine

## 2014-02-07 ENCOUNTER — Other Ambulatory Visit: Payer: Self-pay | Admitting: Physician Assistant

## 2014-02-07 ENCOUNTER — Encounter (HOSPITAL_COMMUNITY): Payer: Self-pay | Admitting: Oncology

## 2014-02-07 ENCOUNTER — Other Ambulatory Visit: Payer: Self-pay | Admitting: Medical Oncology

## 2014-02-07 ENCOUNTER — Encounter: Payer: Self-pay | Admitting: Medical Oncology

## 2014-02-07 ENCOUNTER — Encounter (HOSPITAL_COMMUNITY): Payer: Self-pay

## 2014-02-07 DIAGNOSIS — M19049 Primary osteoarthritis, unspecified hand: Secondary | ICD-10-CM | POA: Diagnosis present

## 2014-02-07 DIAGNOSIS — Z833 Family history of diabetes mellitus: Secondary | ICD-10-CM

## 2014-02-07 DIAGNOSIS — C9000 Multiple myeloma not having achieved remission: Secondary | ICD-10-CM | POA: Diagnosis present

## 2014-02-07 DIAGNOSIS — Z79899 Other long term (current) drug therapy: Secondary | ICD-10-CM

## 2014-02-07 DIAGNOSIS — I82441 Acute embolism and thrombosis of right tibial vein: Secondary | ICD-10-CM | POA: Diagnosis present

## 2014-02-07 DIAGNOSIS — Z880 Allergy status to penicillin: Secondary | ICD-10-CM

## 2014-02-07 DIAGNOSIS — M159 Polyosteoarthritis, unspecified: Secondary | ICD-10-CM | POA: Diagnosis present

## 2014-02-07 DIAGNOSIS — Z96659 Presence of unspecified artificial knee joint: Secondary | ICD-10-CM

## 2014-02-07 DIAGNOSIS — Z7901 Long term (current) use of anticoagulants: Secondary | ICD-10-CM

## 2014-02-07 DIAGNOSIS — Z85820 Personal history of malignant melanoma of skin: Secondary | ICD-10-CM | POA: Diagnosis not present

## 2014-02-07 DIAGNOSIS — I824Z9 Acute embolism and thrombosis of unspecified deep veins of unspecified distal lower extremity: Secondary | ICD-10-CM

## 2014-02-07 DIAGNOSIS — R0989 Other specified symptoms and signs involving the circulatory and respiratory systems: Secondary | ICD-10-CM

## 2014-02-07 DIAGNOSIS — Z87891 Personal history of nicotine dependence: Secondary | ICD-10-CM | POA: Diagnosis not present

## 2014-02-07 DIAGNOSIS — K219 Gastro-esophageal reflux disease without esophagitis: Secondary | ICD-10-CM | POA: Diagnosis present

## 2014-02-07 DIAGNOSIS — N400 Enlarged prostate without lower urinary tract symptoms: Secondary | ICD-10-CM | POA: Diagnosis present

## 2014-02-07 DIAGNOSIS — R0609 Other forms of dyspnea: Secondary | ICD-10-CM

## 2014-02-07 DIAGNOSIS — Z86711 Personal history of pulmonary embolism: Secondary | ICD-10-CM | POA: Diagnosis present

## 2014-02-07 DIAGNOSIS — Z820 Family history of epilepsy and other diseases of the nervous system: Secondary | ICD-10-CM | POA: Diagnosis not present

## 2014-02-07 DIAGNOSIS — I82401 Acute embolism and thrombosis of unspecified deep veins of right lower extremity: Secondary | ICD-10-CM

## 2014-02-07 DIAGNOSIS — Z860100 Personal history of colon polyps, unspecified: Secondary | ICD-10-CM

## 2014-02-07 DIAGNOSIS — Z8249 Family history of ischemic heart disease and other diseases of the circulatory system: Secondary | ICD-10-CM

## 2014-02-07 DIAGNOSIS — R0602 Shortness of breath: Secondary | ICD-10-CM | POA: Diagnosis present

## 2014-02-07 DIAGNOSIS — Z8042 Family history of malignant neoplasm of prostate: Secondary | ICD-10-CM | POA: Diagnosis not present

## 2014-02-07 DIAGNOSIS — Z823 Family history of stroke: Secondary | ICD-10-CM

## 2014-02-07 DIAGNOSIS — R0600 Dyspnea, unspecified: Secondary | ICD-10-CM

## 2014-02-07 DIAGNOSIS — I2699 Other pulmonary embolism without acute cor pulmonale: Principal | ICD-10-CM | POA: Diagnosis present

## 2014-02-07 DIAGNOSIS — I82409 Acute embolism and thrombosis of unspecified deep veins of unspecified lower extremity: Secondary | ICD-10-CM

## 2014-02-07 LAB — TROPONIN I
Troponin I: <0.3 ng/mL (ref <0.30)
Troponin I: <0.3 ng/mL (ref <0.30)

## 2014-02-07 LAB — PROTIME-INR
INR: 1.06 (ref 0.00–1.49)
PROTHROMBIN TIME: 13.8 s (ref 11.6–15.2)

## 2014-02-07 LAB — CBC
HCT: 35.7 % — ABNORMAL LOW (ref 39.0–52.0)
Hemoglobin: 12.1 g/dL — ABNORMAL LOW (ref 13.0–17.0)
MCH: 30.2 pg (ref 26.0–34.0)
MCHC: 33.9 g/dL (ref 30.0–36.0)
MCV: 89 fL (ref 78.0–100.0)
Platelets: 143 10*3/uL — ABNORMAL LOW (ref 150–400)
RBC: 4.01 MIL/uL — ABNORMAL LOW (ref 4.22–5.81)
RDW: 17.1 % — ABNORMAL HIGH (ref 11.5–15.5)
WBC: 3.4 10*3/uL — ABNORMAL LOW (ref 4.0–10.5)

## 2014-02-07 LAB — APTT: aPTT: 69 seconds — ABNORMAL HIGH (ref 24–37)

## 2014-02-07 LAB — KAPPA/LAMBDA LIGHT CHAINS
KAPPA FREE LGHT CHN: 4.42 mg/dL — AB (ref 0.33–1.94)
Kappa:Lambda Ratio: 1.34 (ref 0.26–1.65)
Lambda Free Lght Chn: 3.31 mg/dL — ABNORMAL HIGH (ref 0.57–2.63)

## 2014-02-07 LAB — BETA 2 MICROGLOBULIN, SERUM: Beta-2 Microglobulin: 2.53 mg/L — ABNORMAL HIGH (ref <=2.51)

## 2014-02-07 MED ORDER — ACETAMINOPHEN 325 MG PO TABS: 650.0000 mg | ORAL_TABLET | Freq: Four times a day (QID) | ORAL | Status: DC | PRN

## 2014-02-07 MED ORDER — ENOXAPARIN SODIUM 150 MG/ML ~~LOC~~ SOLN: 150.0000 mg | SUBCUTANEOUS | Status: DC

## 2014-02-07 MED ORDER — PANTOPRAZOLE SODIUM 40 MG PO TBEC
40.0000 mg | DELAYED_RELEASE_TABLET | Freq: Every day | ORAL | Status: DC
Start: 2014-02-07 — End: 2014-02-09
  Administered 2014-02-07 – 2014-02-09 (×3): 40 mg via ORAL
  Filled 2014-02-07 (×3): qty 1

## 2014-02-07 MED ORDER — SODIUM CHLORIDE 0.9 % IJ SOLN
10.0000 mL | INTRAMUSCULAR | Status: DC | PRN
  Administered 2014-02-07 – 2014-02-09 (×3): 10 mL

## 2014-02-07 MED ORDER — MORPHINE SULFATE 2 MG/ML IJ SOLN
1.0000 mg | INTRAMUSCULAR | Status: DC | PRN
  Administered 2014-02-07 – 2014-02-08 (×3): 2 mg via INTRAVENOUS
  Filled 2014-02-07 (×2): qty 1

## 2014-02-07 MED ORDER — ACETAMINOPHEN 650 MG RE SUPP: 650.0000 mg | Freq: Four times a day (QID) | RECTAL | Status: DC | PRN

## 2014-02-07 MED ORDER — ALUM & MAG HYDROXIDE-SIMETH 200-200-20 MG/5ML PO SUSP: 30.0000 mL | Freq: Four times a day (QID) | ORAL | Status: DC | PRN

## 2014-02-07 MED ORDER — MORPHINE SULFATE 2 MG/ML IJ SOLN
INTRAMUSCULAR | Status: AC
  Filled 2014-02-07: qty 1

## 2014-02-07 MED ORDER — ONDANSETRON HCL 4 MG/2ML IJ SOLN
4.0000 mg | Freq: Four times a day (QID) | INTRAMUSCULAR | Status: DC | PRN
  Filled 2014-02-07: qty 2

## 2014-02-07 MED ORDER — IOHEXOL 350 MG/ML SOLN
100.0000 mL | Freq: Once | INTRAVENOUS | Status: AC | PRN
  Administered 2014-02-07: 100 mL via INTRAVENOUS

## 2014-02-07 MED ORDER — HYDROCODONE-ACETAMINOPHEN 5-325 MG PO TABS
1.0000 | ORAL_TABLET | ORAL | Status: DC | PRN
  Administered 2014-02-07 – 2014-02-08 (×8): 2 via ORAL
  Administered 2014-02-09: 1 via ORAL
  Administered 2014-02-09: 2 via ORAL
  Filled 2014-02-07 (×10): qty 2

## 2014-02-07 MED ORDER — SODIUM CHLORIDE 0.9 % IJ SOLN: 3.0000 mL | Freq: Two times a day (BID) | INTRAMUSCULAR | Status: DC

## 2014-02-07 MED ORDER — HEPARIN (PORCINE) IN NACL 100-0.45 UNIT/ML-% IJ SOLN
1600.0000 [IU]/h | INTRAMUSCULAR | Status: DC
  Administered 2014-02-07: 1600 [IU]/h via INTRAVENOUS
  Filled 2014-02-07: qty 250

## 2014-02-07 MED ORDER — ONDANSETRON HCL 4 MG PO TABS
4.0000 mg | ORAL_TABLET | Freq: Four times a day (QID) | ORAL | Status: DC | PRN
  Administered 2014-02-07: 4 mg via ORAL
  Filled 2014-02-07: qty 1

## 2014-02-07 MED ORDER — ADULT MULTIVITAMIN W/MINERALS CH
1.0000 | ORAL_TABLET | Freq: Every day | ORAL | Status: DC
  Administered 2014-02-07 – 2014-02-09 (×3): 1 via ORAL
  Filled 2014-02-07 (×3): qty 1

## 2014-02-07 NOTE — Telephone Encounter (Signed)
Per Dr [name] I instructed pt to start Lovenox daily and to stop coumadin after he starts lovenox. He was also instructed to continue revlimid.

## 2014-02-07 NOTE — Progress Notes (Signed)
ANTICOAGULATION CONSULT NOTE - Initial Consult  Pharmacy Consult for Heparin Infusion Indication: pulmonary embolus  Allergies  Allergen Reactions  . Penicillins     REACTION: rash    Patient Measurements: Height: 5\' 10"  (177.8 cm) Weight: 213 lb 13.5 oz (97 kg) IBW/kg (Calculated) : 73 Heparin Dosing Weight: 89 kg  Vital Signs: Temp: 97.8 F (36.6 C) (07/31 1438) Temp src: Oral (07/31 1438) BP: 129/71 mmHg (07/31 1438) Pulse Rate: 65 (07/31 1438)  Labs:  Recent Labs  02/05/14 1323 02/05/14 1323 02/05/14 1509  HGB 12.6*  --   --   HCT 37.5*  --   --   PLT 154  --   --   LABPROT  --   --  15.4*  INR  --   --  1.22  CREATININE  --  1.0  --     Estimated Creatinine Clearance: 76.9 ml/min (by C-G formula based on Cr of 1).   Medical History: Past Medical History  Diagnosis Date  . GERD (gastroesophageal reflux disease)   . Adenomatous polyp of colon 1996  . Arthritis     Osteoarthritis right knee, spine, wrist  . Benign prostatic hypertrophy     (Dr. Karsten Ro)  . History of chronic prostatitis   . History of melanoma     Back (Dr. Wilhemina Bonito); early melanoma R arm 03/2011  . Diverticulosis   . Arthritis of hand, right     Right thumb (Dr. Daylene Katayama)  . Degenerative disc disease   . Hemorrhoids   . Erosive esophagitis   . Back ache     r/o Multiple Myeloma  . Multiple myeloma 2013    Assessment: 45 yom with PMH of multiple myeloma on Revlimid. Patient was diagnosed with DVT yesterday and now PE today. Pt on warfarin and lovenox 150mg  daily PTA. Pharmacy consulted to dose Heparin infusion for PE.    Goal of Therapy:  Heparin level 0.3-0.7 units/ml   Plan:   No bolus  Start Heparin infusion at 1600 units/hr  Obtain heparin level in 8 hours  Obtain baseline coags  Kizzie Furnish, PharmD Pager: [phone] 02/07/2014 4:13 PM

## 2014-02-07 NOTE — Telephone Encounter (Signed)
Pt called back. For the past two days he is experiencing stomach cramps moving up to his chest and he says  it hard to breath. He is uncomfortable all over. Per Dr [name] Ct angio ordered for today

## 2014-02-07 NOTE — Telephone Encounter (Signed)
His local pharmacy does not have it now and will order it so I called first month supply to [facility] outpt. sept and oct rx called to [facility]. Pt coming in today for injection teaching.

## 2014-02-07 NOTE — Telephone Encounter (Signed)
Called for telemetry bed direct admit to team 8 hospitalist McKenzie Short . Bed control  will call me back.

## 2014-02-07 NOTE — Telephone Encounter (Signed)
I instructed pt to go to radiology for ct angio .

## 2014-02-07 NOTE — Telephone Encounter (Signed)
Report given to [facility]

## 2014-02-07 NOTE — H&P (Signed)
[facility] History and Physical  [name] NOI:[id number] DOB: October 04, 1940 DOA: 02/07/2014  Referring physician: Dr. [name] PCP: [name], MD   Chief Complaint: Sent from the office for PE  HPI: [name] is a 73 y.o. male with past medical history of multiple myeloma, patient is on lenalidomide. Patient developed right lower extremity pain, redness and swelling on Sunday, patient was trying to self treat himself thinking this is pulled muscle, contacted Dr. [name] and had lower extremity Doppler done yesterday which showed DVT and superficial thrombosis. Patient earlier today developed shortness of breath, pleuritic chest pain and he reported some dizziness so he was instructed to come to the hospital for CT angio which showed right lower lobe PE with evidence of heart strain. Patient admitted to the hospital for further evaluation. Patient directly admitted, seen in his room denies any complaints, CT angio reviewed which showed PE in the right lower lobe.  Review of Systems:  Constitutional: negative for anorexia, fevers and sweats Eyes: negative for irritation, redness and visual disturbance Ears, nose, mouth, throat, and face: negative for earaches, epistaxis, nasal congestion and sore throat Respiratory: Shortness of breath, pleuritic chest pain Cardiovascular: negative for chest pain, dyspnea, lower extremity edema, orthopnea, palpitations and syncope Gastrointestinal: negative for abdominal pain, constipation, diarrhea, melena, nausea and vomiting Genitourinary:negative for dysuria, frequency and hematuria Hematologic/lymphatic: negative for bleeding, easy bruising and lymphadenopathy Musculoskeletal:negative for arthralgias, muscle weakness and stiff joints Neurological: negative for coordination problems, gait problems, headaches and weakness Endocrine: negative for diabetic symptoms including polydipsia, polyuria and weight loss Allergic/Immunologic:  negative for anaphylaxis, hay fever and urticaria  Past Medical History  Diagnosis Date  . GERD (gastroesophageal reflux disease)   . Adenomatous polyp of colon 1996  . Arthritis     Osteoarthritis right knee, spine, wrist  . Benign prostatic hypertrophy     (Dr. Karsten Ro)  . History of chronic prostatitis   . History of melanoma     Back (Dr. Wilhemina Bonito); early melanoma R arm 03/2011  . Diverticulosis   . Arthritis of hand, right     Right thumb (Dr. Daylene Katayama)  . Degenerative disc disease   . Hemorrhoids   . Erosive esophagitis   . Back ache     r/o Multiple Myeloma  . Multiple myeloma 2013   Past Surgical History  Procedure Laterality Date  . Total knee replaced  2000    Left knee  . Appendectomy    . Colonoscopy  2009  . Esophagogastroduodenoscopy  2009    mild inflammation at esophagogastric junction  . Excision of melanoma      back ( x3)  . Great toe surgery      bilateral  . Inguinal hernia repair      left, x 2  . Total knee arthroplasty  06/08/2011    Procedure: TOTAL KNEE ARTHROPLASTY;  Surgeon: Ninetta Lights, MD;  Location: [facility];  Service: Orthopedics;  Laterality: Right;  osteonics  . [place]  10/2012   Social History:   reports that he quit smoking about 37 years ago. His smoking use included Cigarettes. He has a 30 pack-year smoking history. He has never used smokeless tobacco. He reports that he drinks about 1.5 ounces of alcohol per week. He reports that he does not use illicit drugs.  Allergies  Allergen Reactions  . Penicillins     REACTION: rash    Family History  Problem Relation Age of Onset  . Dementia Mother   .  Stroke Father   . Hypertension Father   . Diabetes Father   . Prostate cancer Maternal Grandfather   . Anesthesia problems Neg Hx      Prior to Admission medications   Medication Sig Start Date End Date Taking? Authorizing Provider  acetaminophen (TYLENOL) 325 MG tablet Take 650 mg by mouth. prn 03/20/13   Historical  Provider, MD  ascorbic acid (VITAMIN C) 500 MG tablet Take 500 mg by mouth daily.    Historical Provider, MD  B Complex-C (B-COMPLEX WITH VITAMIN C) tablet Take 1 tablet by mouth daily.    Historical Provider, MD  celecoxib (CELEBREX) 200 MG capsule Take 200 mg by mouth daily as needed for pain.    Historical Provider, MD  cholecalciferol (VITAMIN D) 1000 UNITS tablet Take 1,000 Units by mouth daily.    Historical Provider, MD  enoxaparin (LOVENOX) 150 MG/ML injection Inject 1 mL (150 mg total) into the skin daily. 02/07/14   Curt Bears, MD  enoxaparin (LOVENOX) 150 MG/ML injection Inject 1 mL (150 mg total) into the skin daily. 03/10/14   Curt Bears, MD  fish oil-omega-3 fatty acids 1000 MG capsule Take 1 g by mouth daily.     Historical Provider, MD  HYDROcodone-acetaminophen (NORCO) 7.5-325 MG per tablet Take 1 tablet by mouth every 6 (six) hours as needed. 07/15/13   Curt Bears, MD  lenalidomide (REVLIMID) 15 MG capsule Take 1 capsule (15 mg total) by mouth daily. 01/23/14   Curt Bears, MD  methocarbamol (ROBAXIN) 500 MG tablet Take 1 tablet (500 mg total) by mouth at bedtime as needed for muscle spasms. 10/14/13   Camelia Eng Tysinger, PA-C  Multiple Vitamin (MULTIVITAMIN WITH MINERALS) TABS Take 1 tablet by mouth daily.    Historical Provider, MD  ondansetron (ZOFRAN-ODT) 4 MG disintegrating tablet Take 4 mg by mouth every 12 (twelve) hours as needed for nausea.    Historical Provider, MD  oxycodone (OXY-IR) 5 MG capsule Take 5 mg by mouth every 6 (six) hours as needed for pain.    Historical Provider, MD  pantoprazole (PROTONIX) 40 MG tablet Take 40 mg by mouth. Take 1 tablet (40 mg total) by mouth Two (2) times a day. 04/02/13 04/02/14  Historical Provider, MD  PRESCRIPTION MEDICATION Inject into the vein every 21 ( twenty-one) days. Kyprolis and Cytoxan infusion every 3 weeks (Monday and Tuesday)    Historical Provider, MD  Probiotic Product (ALIGN) 4 MG CAPS Take 1 capsule by mouth  daily.    Historical Provider, MD  prochlorperazine (COMPAZINE) 10 MG tablet 10 mg every 6 (six) hours as needed.  01/14/13   Historical Provider, MD  temazepam (RESTORIL) 15 MG capsule Take 1 capsule (15 mg total) by mouth at bedtime. Take 1 capsule (15 mg total) by mouth nightly as needed for sleep. 07/15/13 02/05/14  Curt Bears, MD  zolendronic acid (ZOMETA) 4 MG/5ML injection Inject 4 mg into the vein every 28 (twenty-eight) days.    Historical Provider, MD   Physical Exam: Filed Vitals:   02/07/14 1438  BP: 129/71  Pulse: 65  Temp: 97.8 F (36.6 C)  Resp: 18   Constitutional: Oriented to person, place, and time. Well-developed and well-nourished. Cooperative.  Head: Normocephalic and atraumatic.  Nose: Nose normal.  Mouth/Throat: Uvula is midline, oropharynx is clear and moist and mucous membranes are normal.  Eyes: Conjunctivae and EOM are normal. Pupils are equal, round, and reactive to light.  Neck: Trachea normal and normal range of motion. Neck supple.  Cardiovascular: Normal rate, regular rhythm, S1 normal, S2 normal, normal heart sounds and intact distal pulses.   Pulmonary/Chest: Effort normal and breath sounds normal.  Abdominal: Soft. Bowel sounds are normal. There is no hepatosplenomegaly. There is no tenderness.  Lower extremity: Right lower extremity showed some redness and tenderness.  Neurological: Alert and oriented to person, place, and time. Has normal strength. No cranial nerve deficit or sensory deficit.  Skin: Skin is warm, dry and intact.  Psychiatric: Has a normal mood and affect. Speech is normal and behavior is normal.   Labs on Admission:  Basic Metabolic Panel:  Recent Labs Lab 02/05/14 1323  NA 141  K 3.7  CO2 25  GLUCOSE 109  BUN 10.3  CREATININE 1.0  CALCIUM 8.8   Liver Function Tests:  Recent Labs Lab 02/05/14 1323  AST 19  ALT 19  ALKPHOS 57  BILITOT 0.77  PROT 6.3*  ALBUMIN 3.2*   No results found for this basename: LIPASE,  AMYLASE,  in the last 168 hours No results found for this basename: AMMONIA,  in the last 168 hours CBC:  Recent Labs Lab 02/05/14 1323  WBC 3.8*  NEUTROABS 2.2  HGB 12.6*  HCT 37.5*  MCV 89.7  PLT 154   Cardiac Enzymes: No results found for this basename: CKTOTAL, CKMB, CKMBINDEX, TROPONINI,  in the last 168 hours  BNP (last 3 results) No results found for this basename: PROBNP,  in the last 8760 hours CBG: No results found for this basename: GLUCAP,  in the last 168 hours  Radiological Exams on Admission: Ct Angio Chest Pe W/cm &/or Wo Cm  02/07/2014   CLINICAL DATA:  Dyspnea, chest pain  EXAM: CT ANGIOGRAPHY CHEST WITH CONTRAST  TECHNIQUE: Multidetector CT imaging of the chest was performed using the standard protocol during bolus administration of intravenous contrast. Multiplanar CT image reconstructions and MIPs were obtained to evaluate the vascular anatomy.  CONTRAST:  140mL OMNIPAQUE IOHEXOL 350 MG/ML SOLN  COMPARISON:  None.  FINDINGS: There is adequate opacification of the pulmonary arteries. There is pulmonary embolus present in the right lower lobe pulmonary arteries. There is mild dilatation of the right main pulmonary artery. The heart size is normal. There is no pericardial effusion. There is right heart strain  There is right basilar atelectasis. There is no focal consolidation, pleural effusion or pneumothorax.  There is no axillary, hilar, or mediastinal adenopathy.  There are small lucent lesions and osteopenia of the thoracic spine. There are mild compression deformities of the mid thoracic spine unchanged from the prior exam. Overall findings are consistent with the patient's known history of multiple myeloma.  The visualized portions of the upper abdomen are unremarkable.  Review of the MIP images confirms the above findings.  IMPRESSION: 1. The examination is positive for right lower lobe pulmonary embolus with signs of right heart strain. These results were called by  telephone at the time of interpretation on 02/07/2014 at 12:49 pm to Dr. Awilda Metro , who verbally acknowledged these results.   Electronically Signed   By: Kathreen Devoid   On: 02/07/2014 12:53    EKG: Independently reviewed.   Assessment/Plan Principal Problem:   Acute pulmonary embolism Active Problems:   GERD   Multiple myeloma   Acute deep vein thrombosis (DVT) of tibial vein of right lower extremity    Acute pulmonary embolism -Patient has DVT and acute PE, his risk factors including multiple myeloma and lenalidomide treatment. -Patient was supposed to be on prophylactic  warfarin therapy, his INR this morning is 1.2. -Discussed with Mrs. Wynetta Emery, Dr. Inda Merlin PA and recommendation is to start IV heparin. -I will check 2-D echocardiogram to rule out right heart strain, see if pulmonary needed for thrombolytics. -Oncology recommended discharge on Lovenox 100 mg twice a day at least for 2 weeks still cc him in the office.  Acute DVT -Treatment same as above.  Multiple myeloma -Multiple myeloma along with Revlimid treatment with increase the risk to develop VTE. -Per oncology as outpatient.  GERD -Continue home medications.  Code Status: Full code Family Communication: Plan discussed with the patient Disposition Plan: Inpatient, telemetry.  Time spent: 70 minutes  [facility] Pager [phone]

## 2014-02-08 DIAGNOSIS — I2699 Other pulmonary embolism without acute cor pulmonale: Secondary | ICD-10-CM

## 2014-02-08 DIAGNOSIS — I519 Heart disease, unspecified: Secondary | ICD-10-CM

## 2014-02-08 LAB — BASIC METABOLIC PANEL
ANION GAP: 10 (ref 5–15)
BUN: 10 mg/dL (ref 6–23)
CALCIUM: 8.4 mg/dL (ref 8.4–10.5)
CO2: 29 mEq/L (ref 19–32)
Chloride: 99 mEq/L (ref 96–112)
Creatinine, Ser: 1.04 mg/dL (ref 0.50–1.35)
GFR, EST AFRICAN AMERICAN: 80 mL/min — AB (ref >90)
GFR, EST NON AFRICAN AMERICAN: 69 mL/min — AB (ref >90)
Glucose, Bld: 105 mg/dL — ABNORMAL HIGH (ref 70–99)
Potassium: 3.5 mEq/L — ABNORMAL LOW (ref 3.7–5.3)
SODIUM: 138 meq/L (ref 137–147)

## 2014-02-08 LAB — TROPONIN I

## 2014-02-08 LAB — CBC
HCT: 34.3 % — ABNORMAL LOW (ref 39.0–52.0)
Hemoglobin: 11.5 g/dL — ABNORMAL LOW (ref 13.0–17.0)
MCH: 30.3 pg (ref 26.0–34.0)
MCHC: 33.5 g/dL (ref 30.0–36.0)
MCV: 90.3 fL (ref 78.0–100.0)
PLATELETS: 140 10*3/uL — AB (ref 150–400)
RBC: 3.8 MIL/uL — ABNORMAL LOW (ref 4.22–5.81)
RDW: 17.3 % — ABNORMAL HIGH (ref 11.5–15.5)
WBC: 2.7 10*3/uL — AB (ref 4.0–10.5)

## 2014-02-08 LAB — HEPARIN LEVEL (UNFRACTIONATED)
HEPARIN UNFRACTIONATED: 0.48 [IU]/mL (ref 0.30–0.70)
HEPARIN UNFRACTIONATED: 0.36 [IU]/mL (ref 0.30–0.70)
Heparin Unfractionated: 0.27 IU/mL — ABNORMAL LOW (ref 0.30–0.70)

## 2014-02-08 MED ORDER — HEPARIN (PORCINE) IN NACL 100-0.45 UNIT/ML-% IJ SOLN
1700.0000 [IU]/h | INTRAMUSCULAR | Status: DC
  Administered 2014-02-08 – 2014-02-09 (×3): 1700 [IU]/h via INTRAVENOUS
  Filled 2014-02-08 (×4): qty 250

## 2014-02-08 MED ORDER — ZOLPIDEM TARTRATE 5 MG PO TABS
5.0000 mg | ORAL_TABLET | Freq: Every evening | ORAL | Status: DC | PRN
  Administered 2014-02-08: 5 mg via ORAL
  Filled 2014-02-08: qty 1

## 2014-02-08 MED ORDER — ENOXAPARIN (LOVENOX) PATIENT EDUCATION KIT
PACK | Freq: Once | Status: AC
  Administered 2014-02-08: 13:00:00
  Filled 2014-02-08: qty 1

## 2014-02-08 NOTE — Progress Notes (Signed)
  Echocardiogram 2D Echocardiogram has been performed.  Darlina Sicilian M 02/08/2014, 8:18 AM

## 2014-02-08 NOTE — Progress Notes (Signed)
ANTICOAGULATION CONSULT NOTE - Follow Up Consult  Pharmacy Consult for Heparin Indication: pulmonary embolus  Labs:  Recent Labs  02/07/14 1546 02/07/14 1711 02/07/14 2152 02/08/14 0105 02/08/14 0340 02/08/14 0346 02/08/14 0844 02/08/14 1720  HGB  --  12.1*  --   --  11.5*  --   --   --   HCT  --  35.7*  --   --  34.3*  --   --   --   PLT  --  143*  --   --  140*  --   --   --   APTT  --  69*  --   --   --   --   --   --   LABPROT  --  13.8  --   --   --   --   --   --   INR  --  1.06  --   --   --   --   --   --   HEPARINUNFRC  --   --   --  0.27*  --   --  0.48 0.36  CREATININE  --   --   --   --  1.04  --   --   --   TROPONINI <0.30  --  <0.30  --   --  <0.30  --   --    Medications:  Infusions:  . heparin 1,700 Units/hr (02/08/14 1759)    Assessment: 84 yoM with PMHx MM on revlimid and chronic VTE prophylaxis with warfarin PTA newly diagnosed 7/31 with DVT/PE.  Pharmacy consulted to start IV heparin.  Per pt, oncologist plans to discharge patient on lovenox 100mg  q12h.  Prescription filled but pt had not started taking yet.    8/1: D2 IV heparin.  Heparin level this AM is therapeutic at 1700 units/hr.  Hgb and plts low but stable. No issues per RN.  Repeat heparin level to confirm rate this evening also therapeutic.  Goal of Therapy:  Heparin level 0.3-0.7 units/ml Monitor platelets by anticoagulation protocol: Yes   Plan:  Continue heparin at 1700 units/hr.  Recheck with AM labs.  Hershal Coria, PharmD, BCPS Pager: [phone] 02/08/2014 6:06 PM

## 2014-02-08 NOTE — Progress Notes (Signed)
ANTICOAGULATION CONSULT NOTE - Follow Up Consult  Pharmacy Consult for Heparin Indication: pulmonary embolus  Allergies  Allergen Reactions  . Penicillins     REACTION: rash    Patient Measurements: Height: 5\' 10"  (177.8 cm) Weight: 213 lb 13.5 oz (97 kg) IBW/kg (Calculated) : 73 Heparin Dosing Weight:   Vital Signs: Temp: 97.7 F (36.5 C) (08/01 0542) Temp src: Oral (08/01 0542) BP: 104/56 mmHg (08/01 0542) Pulse Rate: 54 (08/01 0542)  Labs:  Recent Labs  02/05/14 1323 02/05/14 1323 02/05/14 1509 02/07/14 1546 02/07/14 1711 02/07/14 2152 02/08/14 0105 02/08/14 0340 02/08/14 0346 02/08/14 0844  HGB 12.6*  --   --   --  12.1*  --   --  11.5*  --   --   HCT 37.5*  --   --   --  35.7*  --   --  34.3*  --   --   PLT 154  --   --   --  143*  --   --  140*  --   --   APTT  --   --   --   --  69*  --   --   --   --   --   LABPROT  --   --  15.4*  --  13.8  --   --   --   --   --   INR  --   --  1.22  --  1.06  --   --   --   --   --   HEPARINUNFRC  --   --   --   --   --   --  0.27*  --   --  0.48  CREATININE  --  1.0  --   --   --   --   --  1.04  --   --   TROPONINI  --   --   --  <0.30  --  <0.30  --   --  <0.30  --     Estimated Creatinine Clearance: 73.9 ml/min (by C-G formula based on Cr of 1.04).   Medications:  Infusions:  . heparin 1,700 Units/hr (02/08/14 0436)    Assessment: 45 yoM with PMHx MM on revlimid and chronic VTE prophylaxis with warfarin PTA newly diagnosed 7/31 with DVT/PE.  Pharmacy consulted to start IV heparin.  Per pt, oncologist plans to discharge patient on lovenox 100mg  q12h.  Prescription filled but pt had not started taking yet.    8/1: D2 IV heparin.  Heparin level this AM is therapeutic at 1700 units/hr.  Hgb and plts low but stable. No issues per RN.    Goal of Therapy:  Heparin level 0.3-0.7 units/ml Monitor platelets by anticoagulation protocol: Yes   Plan:  Continue heparin at 1700 units/hr.  Recheck in 8 hours.     Ralene Bathe, PharmD, BCPS 02/08/2014, 9:50 AM  Pager: [phone]

## 2014-02-08 NOTE — Progress Notes (Signed)
[facility] PROGRESS NOTE  [name] ZDG:[id number] DOB: 30-Apr-1941 DOA: 02/07/2014 PCP: [name], MD  Assessment/Plan: Principal Problem:   Acute pulmonary embolism Active Problems:   GERD   Multiple myeloma   Acute deep vein thrombosis (DVT) of tibial vein of right lower extremity     Acute pulmonary embolism  -Patient has DVT and acute PE, his risk factors including multiple myeloma and lenalidomide treatment.  -Patient was supposed to be on prophylactic warfarin therapy, his INR was subtherapeutic at the time of admission  -Discussed with Mrs. Wynetta Emery, Dr. Inda Merlin PA and recommendation is to start IV heparin.  Echocardiogram results pending, to rule out right heart strain, see if pulmonary needed for thrombolytics.  -Oncology recommended discharge on Lovenox 100 mg twice a day at least for 2 weeks still cc him in the office.  Lovenox teaching, likely to stay on Lovenox and not resume Coumadin? Okay to ambulate if stable overnight in the morning Acute DVT  -Treatment same as above.  Multiple myeloma  -Multiple myeloma along with Revlimid treatment with increase the risk to develop VTE.  -Per oncology as outpatient.  GERD  -Continue home medications.      Code Status: full Family Communication: family updated about patient's clinical progress Disposition Plan:  As above    Brief narrative: HPI: [name] is a 73 y.o. male with past medical history of multiple myeloma, patient is on lenalidomide. Patient developed right lower extremity pain, redness and swelling on Sunday, patient was trying to self treat himself thinking this is pulled muscle, contacted Dr. [name] and had lower extremity Doppler done yesterday which showed DVT and superficial thrombosis. Patient earlier today developed shortness of breath, pleuritic chest pain and he reported some dizziness so he was instructed to come to the hospital for CT angio which showed right lower lobe PE with  evidence of heart strain. Patient admitted to the hospital for further evaluation.  Patient directly admitted, seen in his room denies any complaints, CT angio reviewed which showed PE in the right lower lobe.  Consultants:  None  Procedures:  None  Antibiotics:  None  HPI/Subjective: Pleasant denies  any chest pain any shortness of breath, motivated and excited about going to [place]  Objective: Filed Vitals:   02/07/14 1438 02/07/14 2050 02/08/14 0542  BP: 129/71 110/71 104/56  Pulse: 65 62 54  Temp: 97.8 F (36.6 C) 97.8 F (36.6 C) 97.7 F (36.5 C)  TempSrc: Oral Oral Oral  Resp: $Remo'18 18 18  'UjHOT$ Height: $Rem'5\' 10"'bkxV$  (1.778 m)    Weight: 97 kg (213 lb 13.5 oz)    SpO2: 97% 96% 94%    Intake/Output Summary (Last 24 hours) at 02/08/14 0950 Last data filed at 02/08/14 0350  Gross per 24 hour  Intake 291.73 ml  Output   3450 ml  Net -3158.27 ml    Exam: Cardiovascular: Normal rate, regular rhythm, S1 normal, S2 normal, normal heart sounds and intact distal pulses.  Pulmonary/Chest: Effort normal and breath sounds normal.  Abdominal: Soft. Bowel sounds are normal. There is no hepatosplenomegaly. There is no tenderness.  Lower extremity: Right lower extremity showed some redness and tenderness.  Neurological: Alert and oriented to person, place, and time. Has normal strength. No cranial nerve deficit or sensory deficit.  Skin: Skin is warm, dry and intact.  Psychiatric: Has a normal mood and affect. Speech is normal and behavior is normal.     Data Reviewed: Basic Metabolic Panel:  Recent Labs Lab  02/05/14 1323 02/08/14 0340  NA 141 138  K 3.7 3.5*  CL  --  99  CO2 25 29  GLUCOSE 109 105*  BUN 10.3 10  CREATININE 1.0 1.04  CALCIUM 8.8 8.4    Liver Function Tests:  Recent Labs Lab 02/05/14 1323  AST 19  ALT 19  ALKPHOS 57  BILITOT 0.77  PROT 6.3*  ALBUMIN 3.2*   No results found for this basename: LIPASE, AMYLASE,  in the last 168 hours No results  found for this basename: AMMONIA,  in the last 168 hours  CBC:  Recent Labs Lab 02/05/14 1323 02/07/14 1711 02/08/14 0340  WBC 3.8* 3.4* 2.7*  NEUTROABS 2.2  --   --   HGB 12.6* 12.1* 11.5*  HCT 37.5* 35.7* 34.3*  MCV 89.7 89.0 90.3  PLT 154 143* 140*    Cardiac Enzymes:  Recent Labs Lab 02/07/14 1546 02/07/14 2152 02/08/14 0346  TROPONINI <0.30 <0.30 <0.30   BNP (last 3 results) No results found for this basename: PROBNP,  in the last 8760 hours   CBG: No results found for this basename: GLUCAP,  in the last 168 hours  No results found for this or any previous visit (from the past 240 hour(s)).   Studies: Ct Angio Chest Pe W/cm &/or Wo Cm  02/07/2014   CLINICAL DATA:  Dyspnea, chest pain  EXAM: CT ANGIOGRAPHY CHEST WITH CONTRAST  TECHNIQUE: Multidetector CT imaging of the chest was performed using the standard protocol during bolus administration of intravenous contrast. Multiplanar CT image reconstructions and MIPs were obtained to evaluate the vascular anatomy.  CONTRAST:  182mL OMNIPAQUE IOHEXOL 350 MG/ML SOLN  COMPARISON:  None.  FINDINGS: There is adequate opacification of the pulmonary arteries. There is pulmonary embolus present in the right lower lobe pulmonary arteries. There is mild dilatation of the right main pulmonary artery. The heart size is normal. There is no pericardial effusion. There is right heart strain  There is right basilar atelectasis. There is no focal consolidation, pleural effusion or pneumothorax.  There is no axillary, hilar, or mediastinal adenopathy.  There are small lucent lesions and osteopenia of the thoracic spine. There are mild compression deformities of the mid thoracic spine unchanged from the prior exam. Overall findings are consistent with the patient's known history of multiple myeloma.  The visualized portions of the upper abdomen are unremarkable.  Review of the MIP images confirms the above findings.  IMPRESSION: 1. The examination  is positive for right lower lobe pulmonary embolus with signs of right heart strain. These results were called by telephone at the time of interpretation on 02/07/2014 at 12:49 pm to Dr. Awilda Metro , who verbally acknowledged these results.   Electronically Signed   By: Kathreen Devoid   On: 02/07/2014 12:53    Scheduled Meds: . multivitamin with minerals  1 tablet Oral Daily  . pantoprazole  40 mg Oral Daily  . sodium chloride  3 mL Intravenous Q12H   Continuous Infusions: . heparin 1,700 Units/hr (02/08/14 0436)    Principal Problem:   Acute pulmonary embolism Active Problems:   GERD   Multiple myeloma   Acute deep vein thrombosis (DVT) of tibial vein of right lower extremity    Time spent: 40 minutes   [facility] Pager [phone]. If 8PM-8AM, please contact night-coverage at [email or web address], password [facility] 02/08/2014, 9:50 AM  LOS: 1 day

## 2014-02-08 NOTE — Progress Notes (Signed)
ANTICOAGULATION CONSULT NOTE - Follow Up Consult  Pharmacy Consult for Heparin Indication: pulmonary embolus  Allergies  Allergen Reactions  . Penicillins     REACTION: rash    Patient Measurements: Height: 5\' 10"  (177.8 cm) Weight: 213 lb 13.5 oz (97 kg) IBW/kg (Calculated) : 73 Heparin Dosing Weight:   Vital Signs: Temp: 97.8 F (36.6 C) (07/31 2050) Temp src: Oral (07/31 2050) BP: 110/71 mmHg (07/31 2050) Pulse Rate: 62 (07/31 2050)  Labs:  Recent Labs  02/05/14 1323 02/05/14 1323 02/05/14 1509 02/07/14 1546 02/07/14 1711 02/07/14 2152 02/08/14 0105  HGB 12.6*  --   --   --  12.1*  --   --   HCT 37.5*  --   --   --  35.7*  --   --   PLT 154  --   --   --  143*  --   --   APTT  --   --   --   --  69*  --   --   LABPROT  --   --  15.4*  --  13.8  --   --   INR  --   --  1.22  --  1.06  --   --   HEPARINUNFRC  --   --   --   --   --   --  0.27*  CREATININE  --  1.0  --   --   --   --   --   TROPONINI  --   --   --  <0.30  --  <0.30  --     Estimated Creatinine Clearance: 76.9 ml/min (by C-G formula based on Cr of 1).   Medications:  Infusions:  . heparin 1,600 Units/hr (02/07/14 1854)  . heparin 1,700 Units/hr (02/08/14 0245)    Assessment: Patient with low heparin level.  No issues per RN.  Goal of Therapy:  Heparin level 0.3-0.7 units/ml Monitor platelets by anticoagulation protocol: Yes   Plan:  Increase heparin to 1700 units/hr Recheck at 0900.  Tyler Deis, Shea Stakes [place] 02/08/2014,2:35 AM

## 2014-02-09 DIAGNOSIS — I82409 Acute embolism and thrombosis of unspecified deep veins of unspecified lower extremity: Secondary | ICD-10-CM

## 2014-02-09 DIAGNOSIS — K219 Gastro-esophageal reflux disease without esophagitis: Secondary | ICD-10-CM

## 2014-02-09 LAB — HEPARIN LEVEL (UNFRACTIONATED): HEPARIN UNFRACTIONATED: 0.36 [IU]/mL (ref 0.30–0.70)

## 2014-02-09 MED ORDER — ENOXAPARIN SODIUM 150 MG/ML ~~LOC~~ SOLN
100.0000 mg | Freq: Two times a day (BID) | SUBCUTANEOUS | Status: DC
  Filled 2014-02-09 (×2): qty 1

## 2014-02-09 MED ORDER — ENOXAPARIN SODIUM 100 MG/ML ~~LOC~~ SOLN: 100.0000 mg | Freq: Two times a day (BID) | SUBCUTANEOUS | Status: DC

## 2014-02-09 MED ORDER — POTASSIUM CHLORIDE CRYS ER 20 MEQ PO TBCR
40.0000 meq | EXTENDED_RELEASE_TABLET | ORAL | Status: AC
  Administered 2014-02-09: 40 meq via ORAL
  Filled 2014-02-09 (×2): qty 2

## 2014-02-09 MED ORDER — HEPARIN SOD (PORK) LOCK FLUSH 100 UNIT/ML IV SOLN
500.0000 [IU] | INTRAVENOUS | Status: AC | PRN
  Administered 2014-02-09: 500 [IU]

## 2014-02-09 NOTE — Progress Notes (Signed)
PT Cancellation Note  Patient Details Name: [name] MRN: [id number] DOB: 1941/06/10   Cancelled Treatment:    Reason Eval/Treat Not Completed: Other (comment) (pt states he is I, not having any difficulty moving around, no PT needs/pt declines)   [facility] 02/09/2014, 11:16 AM

## 2014-02-09 NOTE — Progress Notes (Signed)
Discharge instructions reviewed with patient and wife. Lovenox education and administration done utilizing teach back method. Prescription given to patient. Patient discharge to home

## 2014-02-09 NOTE — Discharge Summary (Signed)
Physician Discharge Summary  [name] MVE:[id number] DOB: Oct 29, 1940 DOA: 02/07/2014  PCP: [name], MD  Admit date: 02/07/2014 Discharge date: 02/09/2014  Time spent: >30 minutes  Recommendations for Outpatient Follow-up:  CBC to follow platelets and hemoglobin trend  Discharge Diagnoses:  Principal Problem:   Acute pulmonary embolism Active Problems:   GERD   Multiple myeloma   Acute deep vein thrombosis (DVT) of tibial vein of right lower extremity   Discharge Condition: Stable and improved, patient denies any further chest pain, shortness of breath or any other acute complaints. Will follow with oncology service at discharge and also with primary care physician in the next 2 weeks.  Diet recommendation: Regular diet  Filed Weights   02/07/14 1438  Weight: 97 kg (213 lb 13.5 oz)    History of present illness:  73 y.o. male with past medical history of multiple myeloma, patient is on lenalidomide. Patient developed right lower extremity pain, redness and swelling on Sunday (7/26), patient was trying to self treat himself thinking this is pulled muscle; he contacted Dr. [name] and had lower extremity Doppler done on 7/31, which showed DVT and superficial thrombosis. Patient earlier on day of admission (7/1) developed shortness of breath, pleuritic chest pain and he reported some dizziness so he was instructed to come to the hospital for CT angio which showed right lower lobe PE with evidence of heart strain. Patient admitted to the hospital for further evaluation.    Hospital Course:  1-acute pulmonary embolism: Patient with high risk factors for thromboembolism given history of multiple myeloma, revlemid therapy and positive recent acute DVT. -Following oncology recommendations will treat with Lovenox 100 mg twice a day -Patient with mild enlargement and decreased systolic function of his right ventricle to discuss with cardiologist not enough abnormalities to require  thrombolytics or any further intervention. -Patient hemodynamically stable with good oxygen saturation on room air. -Followup as previously instructed with oncology as an outpatient  2-acute DVT (right lower extremity) -Will be treated with ongoing Lovenox therapy  3-GERD: Continue PPI   4-multiple myeloma: Actively receiving revlemid.  -continue follow up and therapy as dictated by oncology service  5-osteoarthritis: Patient advise to avoid intakes (given anticoagulation therapy) -Continue the use of Vicodin and oxycodone as needed. -Instructed to use over-the-counter acetaminophen for breakthrough for light pain   Procedures:  2-D echo: Patient with preserved ejection fraction (60-65%), no wall abnormalities and no significant valvular defects. Slight decrease in systolic function of his right ventricle and also mildly dilatation.   Consultations:  Oncology (Dr. [name] curbside)  Cardiology (Dr. Meda Coffee)  Discharge Exam: Filed Vitals:   02/09/14 0629  BP: 104/61  Pulse: 51  Temp: 97.4 F (36.3 C)  Resp: 20    General: Alert, awake and oriented x3; afebrile, denies any chest pain and difficulty breathing. Good oxygen saturation on room air. Cardiovascular: S1 and S2, no rubs, no gallops Respiratory: Clear to auscultation bilaterally Abdomen: Nontender, nondistended, positive bowel sounds, soft; no guarding or rebound Extremities: Right lower extremity slightly swollen, no erythema; patient reports mild discomfort with palpation. Trace edema appreciated on his left lower extremity but not tenderness reported.  Discharge Instructions You were cared for by a hospitalist during your hospital stay. If you have any questions about your discharge medications or the care you received while you were in the hospital after you are discharged, you can call the unit and asked to speak with the hospitalist on call if the hospitalist that took care of  you is not available. Once you are  discharged, your primary care physician will handle any further medical issues. Please note that NO REFILLS for any discharge medications will be authorized once you are discharged, as it is imperative that you return to your primary care physician (or establish a relationship with a primary care physician if you do not have one) for your aftercare needs so that they can reassess your need for medications and monitor your lab values.  Discharge Instructions   Discharge instructions    Complete by:  As directed   Take medications as prescribed Increase activity slowly Follow with oncologist as previously instructed Use lovenox twice a day ($Remo'100mg'OqaML$ ) Avoid NSAID's (celbrex, ibuprofen, aleve, excedrin, motrin, etc...)            Medication List    STOP taking these medications       celecoxib 200 MG capsule  Commonly known as:  CELEBREX     enoxaparin 150 MG/ML injection  Commonly known as:  LOVENOX  Replaced by:  enoxaparin 100 MG/ML injection      TAKE these medications       acetaminophen 325 MG tablet  Commonly known as:  TYLENOL  Take 650 mg by mouth. prn     ALIGN 4 MG Caps  Take 1 capsule by mouth daily.     ascorbic acid 500 MG tablet  Commonly known as:  VITAMIN C  Take 500 mg by mouth daily.     B-complex with vitamin C tablet  Take 1 tablet by mouth daily.     cholecalciferol 1000 UNITS tablet  Commonly known as:  VITAMIN D  Take 1,000 Units by mouth daily.     enoxaparin 100 MG/ML injection  Commonly known as:  LOVENOX  Inject 1 mL (100 mg total) into the skin every 12 (twelve) hours.     fish oil-omega-3 fatty acids 1000 MG capsule  Take 1 g by mouth daily.     HYDROcodone-acetaminophen 7.5-325 MG per tablet  Commonly known as:  NORCO  Take 1 tablet by mouth every 6 (six) hours as needed for moderate pain.     lenalidomide 15 MG capsule  Commonly known as:  REVLIMID  Take 1 capsule (15 mg total) by mouth daily.     methocarbamol 500 MG tablet   Commonly known as:  ROBAXIN  Take 1 tablet (500 mg total) by mouth at bedtime as needed for muscle spasms.     multivitamin with minerals Tabs tablet  Take 1 tablet by mouth daily.     ondansetron 4 MG disintegrating tablet  Commonly known as:  ZOFRAN-ODT  Take 4 mg by mouth every 12 (twelve) hours as needed for nausea.     oxycodone 5 MG capsule  Commonly known as:  OXY-IR  Take 5 mg by mouth every 6 (six) hours as needed for pain.     pantoprazole 40 MG tablet  Commonly known as:  PROTONIX  Take 40 mg by mouth. Take 1 tablet (40 mg total) by mouth Two (2) times a day.     prochlorperazine 10 MG tablet  Commonly known as:  COMPAZINE  10 mg every 6 (six) hours as needed.     temazepam 15 MG capsule  Commonly known as:  RESTORIL  Take 1 capsule (15 mg total) by mouth at bedtime. Take 1 capsule (15 mg total) by mouth nightly as needed for sleep.     ZOMETA 4 MG/5ML injection  Generic drug:  zolendronic  acid  Inject 4 mg into the vein every 28 (twenty-eight) days.       Allergies  Allergen Reactions  . Penicillins     REACTION: rash       Follow-up Information   Follow up with [name],[name], MD. Schedule an appointment as soon as possible for a visit in 14 days.   Specialty:  Family Medicine   Contact information:   [place] [address] [phone]       Follow up with Eilleen Kempf., MD On 02/12/2014. (contact office if needed to confirm appointment time)    Specialty:  Oncology   Contact information:   [place] [address] [phone]        The results of significant diagnostics from this hospitalization (including imaging, microbiology, ancillary and laboratory) are listed below for reference.    Significant Diagnostic Studies: Ct Angio Chest Pe W/cm &/or Wo Cm  02/07/2014   CLINICAL DATA:  Dyspnea, chest pain  EXAM: CT ANGIOGRAPHY CHEST WITH CONTRAST  TECHNIQUE: Multidetector CT imaging of the chest was  performed using the standard protocol during bolus administration of intravenous contrast. Multiplanar CT image reconstructions and MIPs were obtained to evaluate the vascular anatomy.  CONTRAST:  163mL OMNIPAQUE IOHEXOL 350 MG/ML SOLN  COMPARISON:  None.  FINDINGS: There is adequate opacification of the pulmonary arteries. There is pulmonary embolus present in the right lower lobe pulmonary arteries. There is mild dilatation of the right main pulmonary artery. The heart size is normal. There is no pericardial effusion. There is right heart strain  There is right basilar atelectasis. There is no focal consolidation, pleural effusion or pneumothorax.  There is no axillary, hilar, or mediastinal adenopathy.  There are small lucent lesions and osteopenia of the thoracic spine. There are mild compression deformities of the mid thoracic spine unchanged from the prior exam. Overall findings are consistent with the patient's known history of multiple myeloma.  The visualized portions of the upper abdomen are unremarkable.  Review of the MIP images confirms the above findings.  IMPRESSION: 1. The examination is positive for right lower lobe pulmonary embolus with signs of right heart strain. These results were called by telephone at the time of interpretation on 02/07/2014 at 12:49 pm to Dr. Awilda Metro , who verbally acknowledged these results.   Electronically Signed   By: Kathreen Devoid   On: 02/07/2014 12:53   Labs: Basic Metabolic Panel:  Recent Labs Lab 02/05/14 1323 02/08/14 0340  NA 141 138  K 3.7 3.5*  CL  --  99  CO2 25 29  GLUCOSE 109 105*  BUN 10.3 10  CREATININE 1.0 1.04  CALCIUM 8.8 8.4   Liver Function Tests:  Recent Labs Lab 02/05/14 1323  AST 19  ALT 19  ALKPHOS 57  BILITOT 0.77  PROT 6.3*  ALBUMIN 3.2*   CBC:  Recent Labs Lab 02/05/14 1323 02/07/14 1711 02/08/14 0340  WBC 3.8* 3.4* 2.7*  NEUTROABS 2.2  --   --   HGB 12.6* 12.1* 11.5*  HCT 37.5* 35.7* 34.3*  MCV 89.7  89.0 90.3  PLT 154 143* 140*   Cardiac Enzymes:  Recent Labs Lab 02/07/14 1546 02/07/14 2152 02/08/14 0346  TROPONINI <0.30 <0.30 <0.30    Signed:  Barton Dubois  [facility] 02/09/2014, 12:01 PM

## 2014-02-09 NOTE — Progress Notes (Signed)
ANTICOAGULATION CONSULT NOTE - Follow Up Consult  Pharmacy Consult for Heparin Indication: pulmonary embolus  Labs:  Recent Labs  02/07/14 1546 02/07/14 1711 02/07/14 2152  02/08/14 0340 02/08/14 0346 02/08/14 0844 02/08/14 1720 02/09/14 0448  HGB  --  12.1*  --   --  11.5*  --   --   --   --   HCT  --  35.7*  --   --  34.3*  --   --   --   --   PLT  --  143*  --   --  140*  --   --   --   --   APTT  --  69*  --   --   --   --   --   --   --   LABPROT  --  13.8  --   --   --   --   --   --   --   INR  --  1.06  --   --   --   --   --   --   --   HEPARINUNFRC  --   --   --   < >  --   --  0.48 0.36 0.36  CREATININE  --   --   --   --  1.04  --   --   --   --   TROPONINI <0.30  --  <0.30  --   --  <0.30  --   --   --   < > = values in this interval not displayed. Medications:  Infusions:  . heparin 1,700 Units/hr (02/08/14 1759)    Assessment: 38 yoM with PMHx MM on revlimid and chronic VTE prophylaxis with warfarin PTA newly diagnosed 7/31 with DVT/PE.  Pharmacy consulted to start IV heparin.  Per pt, oncologist plans to discharge patient on lovenox 100mg  q12h.  Prescription filled but pt had not started taking yet.    8/2: D3 IV heparin.  Heparin level this AM remains therapeutic at 1700 units/hr.  Hgb and plts low but stable (8/1). No issues per RN.   Goal of Therapy:  Heparin level 0.3-0.7 units/ml Monitor platelets by anticoagulation protocol: Yes   Plan:  Continue heparin at 1700 units/hr.  Follow daily HLs.  F/u plan to transition to SQ lovenox.    Ralene Bathe, PharmD, BCPS 02/09/2014, 7:08 AM  Pager: [phone]

## 2014-02-12 ENCOUNTER — Ambulatory Visit (HOSPITAL_BASED_OUTPATIENT_CLINIC_OR_DEPARTMENT_OTHER): Payer: Medicare Other

## 2014-02-12 ENCOUNTER — Encounter: Payer: Self-pay | Admitting: Internal Medicine

## 2014-02-12 ENCOUNTER — Ambulatory Visit (HOSPITAL_BASED_OUTPATIENT_CLINIC_OR_DEPARTMENT_OTHER): Payer: Medicare Other | Admitting: Physician Assistant

## 2014-02-12 ENCOUNTER — Telehealth: Payer: Self-pay | Admitting: Internal Medicine

## 2014-02-12 ENCOUNTER — Encounter: Payer: Self-pay | Admitting: Physician Assistant

## 2014-02-12 ENCOUNTER — Other Ambulatory Visit: Payer: Medicare Other

## 2014-02-12 VITALS — BP 138/82 | HR 66 | Temp 98.0°F | Resp 18 | Ht 70.0 in | Wt 212.1 lb

## 2014-02-12 DIAGNOSIS — C9000 Multiple myeloma not having achieved remission: Secondary | ICD-10-CM

## 2014-02-12 DIAGNOSIS — I82409 Acute embolism and thrombosis of unspecified deep veins of unspecified lower extremity: Secondary | ICD-10-CM

## 2014-02-12 DIAGNOSIS — I2699 Other pulmonary embolism without acute cor pulmonale: Secondary | ICD-10-CM

## 2014-02-12 MED ORDER — RIVAROXABAN 20 MG PO TABS: 20.0000 mg | ORAL_TABLET | Freq: Every day | ORAL | Status: DC

## 2014-02-12 MED ORDER — SODIUM CHLORIDE 0.9 % IV SOLN
Freq: Once | INTRAVENOUS | Status: AC
  Administered 2014-02-12: 11:00:00 via INTRAVENOUS

## 2014-02-12 MED ORDER — SODIUM CHLORIDE 0.9 % IJ SOLN
10.0000 mL | INTRAMUSCULAR | Status: DC | PRN
  Filled 2014-02-12: qty 10

## 2014-02-12 MED ORDER — RIVAROXABAN (XARELTO) VTE STARTER PACK (15 & 20 MG): ORAL_TABLET | ORAL | Status: DC

## 2014-02-12 MED ORDER — ZOLEDRONIC ACID 4 MG/100ML IV SOLN
4.0000 mg | Freq: Once | INTRAVENOUS | Status: AC
  Administered 2014-02-12: 4 mg via INTRAVENOUS
  Filled 2014-02-12: qty 100

## 2014-02-12 MED ORDER — HEPARIN SOD (PORK) LOCK FLUSH 100 UNIT/ML IV SOLN
500.0000 [IU] | Freq: Once | INTRAVENOUS | Status: DC | PRN
  Filled 2014-02-12: qty 5

## 2014-02-12 NOTE — Patient Instructions (Signed)

## 2014-02-12 NOTE — Progress Notes (Signed)
[facility], [id number], approved xarelto from 02/12/14-02/13/15

## 2014-02-12 NOTE — Progress Notes (Addendum)
[place] [phone]) [phone]   Fax:(336) [phone]  SHARED VISIT PROGRESS NOTE  [name],[name], MD [place] [address]  DIAGNOSIS:  1. Stage IIA multiple myeloma diagnosed in July of 2013.  2. Right lower [place] DVT 3. Pulmonary embolism  PRIOR THERAPY:  1) Systemic chemotherapy with subcutaneous Velcade 1.3 mg/M2 on days 1, 4, 8 and 11 every 3 weeks in addition to Revlimid 25 mg by mouth daily for 14 days every 3 weeks and dexamethasone 40 mg on a weekly basis. Status post 4 planned cycles.  2) Systemic chemotherapy with subcutaneous Velcade 1.3 mg/M2 on days 1, 4, 8 and 11 every 3 weeks in addition to Revlimid 25 mg by mouth daily for 14 days every 3 weeks and dexamethasone 40 mg on a weekly basis. 4 more cycles are planned and he is status post 4 cycles of the second set of 4 cycles, now status post a total of 10 cycles. 3) Systemic chemotherapy with Carfilzomib 36 mg/M2 on days 1, 2, 8, 9, 15 and 16 in addition to cyclophosphamide 300 mg/M2 IV on days 1, 8 and 15 as well as dexamethasone on days 1, 8, 15 and 22 every 4 weeks. Status post 4 cycles. 4) Zometa 4 mg IV every month. 5) autologous peripheral blood stem cell [facility] on 03/20/[facility] 6) Consolidation chemotherapy with Carfilzomib 36 mg/M2 on days 1, 2, 8, 9, 15 and 16 every 4 weeks in addition to cyclophosphamide 300 mg/M2 on days 1, 8 and 15 as well as dexamethasone 40 mg on a weekly basis every 4 weeks. Status post 2 cycles. First cycle 06/11/2013. 7) maintenance therapy with Revlimid 10 mg by mouth daily status post 3 months of treatment.    CURRENT THERAPY:  1) Maintenance therapy with Revlimid 15 mg by mouth daily started 11/09/2013. 2) Zometa 4 mg IV every 2 months. 3) Lovenox 100 mg subcutaneously every 12 hours   INTERVAL HISTORY: [name] 73 y.o. male returns to the clinic today for followup visit. He started his treatment with Revlimid  15 mg by mouth daily you on 11/09/2013. He is tolerating his treatment fairly well with no significant adverse effects except for recurrent skin rash on the upper and lower extremities mainly on the sun exposed area. It improved after treatment with steroids but presented again after the patient and has a lot of sun exposure. He also complains of dry skin. He recently developed right lower extremity deep vein thrombosis and was to start treatment with Lovenox at 150 mg subcutaneously once daily however he developed sudden shortness of breath and was diagnosed with right pulmonary embolism. He was hospitalized and treated with IV heparin and discharged on Lovenox at 100 mg subcutaneously every 12 hours. He presents today for a followup appointment. He was admitted on 02/07/2014 and discharge on 02/09/2014. He complains of significant abdominal discomfort related to the Lovenox subcutaneous injections. He also has concerns as he and his wife leave on March 10, 2014 for a month-long [place] vacation. He is feeling much better today regarding his breathing, having significantly less shortness of breath compared to when he was admitted on February 07, 2014. He denied having any significant fatigue or weakness. He denied having any fever or chills. He has no nausea or vomiting.  He denied having any significant chest pain, shortness of breath, cough or hemoptysis.   MEDICAL HISTORY: Past Medical History  Diagnosis Date  . GERD (  gastroesophageal reflux disease)   . Adenomatous polyp of colon 1996  . Arthritis     Osteoarthritis right knee, spine, wrist  . Benign prostatic hypertrophy     (Dr. Karsten Ro)  . History of chronic prostatitis   . History of melanoma     Back (Dr. Wilhemina Bonito); early melanoma R arm 03/2011  . Diverticulosis   . Arthritis of hand, right     Right thumb (Dr. Daylene Katayama)  . Degenerative disc disease   . Hemorrhoids   . Erosive esophagitis   . Back ache     r/o Multiple Myeloma  . Multiple  myeloma 2013    ALLERGIES:  is allergic to penicillins.  MEDICATIONS:  Current Outpatient Prescriptions  Medication Sig Dispense Refill  . acetaminophen (TYLENOL) 325 MG tablet Take 650 mg by mouth. prn      . ascorbic acid (VITAMIN C) 500 MG tablet Take 500 mg by mouth daily.      . B Complex-C (B-COMPLEX WITH VITAMIN C) tablet Take 1 tablet by mouth daily.      . cholecalciferol (VITAMIN D) 1000 UNITS tablet Take 1,000 Units by mouth daily.      Marland Kitchen enoxaparin (LOVENOX) 100 MG/ML injection Inject 1 mL (100 mg total) into the skin every 12 (twelve) hours.  60 mL  0  . fish oil-omega-3 fatty acids 1000 MG capsule Take 1 g by mouth daily.       Marland Kitchen HYDROcodone-acetaminophen (NORCO) 7.5-325 MG per tablet Take 1 tablet by mouth every 6 (six) hours as needed for moderate pain.      Marland Kitchen lenalidomide (REVLIMID) 15 MG capsule Take 1 capsule (15 mg total) by mouth daily.  28 capsule  0  . Multiple Vitamin (MULTIVITAMIN WITH MINERALS) TABS Take 1 tablet by mouth daily.      . ondansetron (ZOFRAN-ODT) 4 MG disintegrating tablet Take 4 mg by mouth every 12 (twelve) hours as needed for nausea.      Marland Kitchen oxycodone (OXY-IR) 5 MG capsule Take 5 mg by mouth every 6 (six) hours as needed for pain.      . pantoprazole (PROTONIX) 40 MG tablet Take 40 mg by mouth. Take 1 tablet (40 mg total) by mouth Two (2) times a day.      . Probiotic Product (ALIGN) 4 MG CAPS Take 1 capsule by mouth daily.      . prochlorperazine (COMPAZINE) 10 MG tablet 10 mg every 6 (six) hours as needed.       . zolendronic acid (ZOMETA) 4 MG/5ML injection Inject 4 mg into the vein every 28 (twenty-eight) days.      . methocarbamol (ROBAXIN) 500 MG tablet Take 1 tablet (500 mg total) by mouth at bedtime as needed for muscle spasms.  20 tablet  0  . Rivaroxaban (XARELTO STARTER PACK) 15 & 20 MG TBPK Take as directed on package: Start with one $Remove'15mg'qywendG$  tablet by mouth twice a day with food. On Day 22, switch to one $Remo'20mg'zahUx$  tablet once a day with food.  51  each  0  . rivaroxaban (XARELTO) 20 MG TABS tablet Take 1 tablet (20 mg total) by mouth daily with supper.  30 tablet  3  . temazepam (RESTORIL) 15 MG capsule Take 1 capsule (15 mg total) by mouth at bedtime. Take 1 capsule (15 mg total) by mouth nightly as needed for sleep.  30 capsule  0   No current facility-administered medications for this visit.   Facility-Administered Medications Ordered in Other Visits  Medication Dose Route Frequency Provider Last Rate Last Dose  . ondansetron (ZOFRAN) tablet 8 mg  8 mg Oral Once Curt Bears, MD      . sodium chloride 0.9 % injection 10 mL  10 mL Intracatheter PRN Curt Bears, MD   10 mL at 06/11/13 1616    SURGICAL HISTORY:  Past Surgical History  Procedure Laterality Date  . Total knee replaced  2000    Left knee  . Appendectomy    . Colonoscopy  2009  . Esophagogastroduodenoscopy  2009    mild inflammation at esophagogastric junction  . Excision of melanoma      back ( x3)  . Great toe surgery      bilateral  . Inguinal hernia repair      left, x 2  . Total knee arthroplasty  06/08/2011    Procedure: TOTAL KNEE ARTHROPLASTY;  Surgeon: Ninetta Lights, MD;  Location: [facility];  Service: Orthopedics;  Laterality: Right;  osteonics  . [place]  10/2012    REVIEW OF SYSTEMS:  A comprehensive review of systems was negative except for: Integument/breast: positive for rash   PHYSICAL EXAMINATION: General appearance: alert, cooperative and no distress Head: Normocephalic, without obvious abnormality, atraumatic Neck: no adenopathy, no JVD, supple, symmetrical, trachea midline and thyroid not enlarged, symmetric, no tenderness/mass/nodules Lymph nodes: Cervical, supraclavicular, and axillary nodes normal. Resp: clear to auscultation bilaterally Back: symmetric, no curvature. ROM normal. No CVA tenderness. Cardio: regular rate and rhythm, S1, S2 normal, no murmur, click, rub or gallop GI: soft, non-tender; bowel sounds normal; no  masses,  no organomegaly Extremities: extremities normal, atraumatic, no cyanosis or edema Neurologic: Alert and oriented X 3, normal strength and tone. Normal symmetric reflexes. Normal coordination and gait  ECOG PERFORMANCE STATUS: 1 - Symptomatic but completely ambulatory  Blood pressure 138/82, pulse 66, temperature 98 F (36.7 C), temperature source Oral, resp. rate 18, height _0  (1.778 m), weight 212 lb 1.6 oz (96.208 kg), SpO2 98.00%.  LABORATORY DATA: Lab Results  Component Value Date   WBC 2.7* 02/08/2014   HGB 11.5* 02/08/2014   HCT 34.3* 02/08/2014   MCV 90.3 02/08/2014   PLT 140* 02/08/2014      Chemistry      Component Value Date/Time   NA 138 02/08/2014 0340   NA 141 02/05/2014 1323   K 3.5* 02/08/2014 0340   K 3.7 02/05/2014 1323   CL 99 02/08/2014 0340   CL 109* 12/31/2012 0759   CO2 29 02/08/2014 0340   CO2 25 02/05/2014 1323   BUN 10 02/08/2014 0340   BUN 10.3 02/05/2014 1323   CREATININE 1.04 02/08/2014 0340   CREATININE 1.0 02/05/2014 1323   CREATININE 0.97 11/18/2013 1045      Component Value Date/Time   CALCIUM 8.4 02/08/2014 0340   CALCIUM 8.8 02/05/2014 1323   ALKPHOS 57 02/05/2014 1323   ALKPHOS 40 11/18/2013 1045   AST 19 02/05/2014 1323   AST 25 11/18/2013 1045   ALT 19 02/05/2014 1323   ALT 33 11/18/2013 1045   BILITOT 0.77 02/05/2014 1323   BILITOT 0.4 11/18/2013 1045       RADIOGRAPHIC STUDIES: No results found.  ASSESSMENT AND PLAN: This is a very pleasant 73 years old white male with history of multiple myeloma status post induction chemotherapy with Velcade, Revlimid and dexamethasone followed by treatment with Carfilzomib, cyclophosphamide and dexamethasone with significant improvement in his disease followed by autologous peripheral blood stem cell transplant at [facility] on 03/20/2013. This was followed by 2 cycles of consolidation chemotherapy with Carfilzomib, Cytoxan and dexamethasone, and he tolerated it fairly well. He completed maintenance Revlimid  10 mg by mouth daily for the last 3 months and tolerating it fairly well. He started maintenance Revlimid with 15 mg by mouth daily last week and tolerating it well except for development of skin rash which usually associated with sun exposure. The patient was discussed with and also seen by Dr. [name]. Regarding his anticoagulation treatment, he will be changed to [facility], starting with 15 mg by mouth twice daily for 21 days then 20 mg by mouth daily. Prescriptions for both strengths were sent to the [facility] outpatient pharmacy. We will discontinue the Lovenox therapy. He will continue treatment with Zometa every 2 months. He'll followup in early October once he returns from [place].  He was advised to call immediately if he has any concerning symptoms in the interval. The patient voices understanding of current disease status and treatment options and is in agreement with the current care plan.  All questions were answered. The patient knows to call the clinic with any problems, questions or concerns. We can certainly see the patient much sooner if necessary.  Carlton Adam PA-C  ADDENDUM: Hematology/Oncology Attending: I had a face to face encounter with the patient. I recommended his care plan. This is a very pleasant 73 years old white male with history of multiple myeloma currently on maintenance treatment with Revlimid. He was recently diagnosed with right lower extremity deep venous thrombosis as well as bilateral pulmonary embolism. The patient was recently admitted to [facility] and started on IV heparin and currently discharged on Lovenox 100 mg by mouth twice a day. He is worried about continue Lovenox because of the inconvenience with the subcutaneous injection. I gave the patient the option of switching his treatment with oral Xarelto starting at a dose of 50 mg by mouth twice a day for 3 weeks followed by 20 mg by mouth daily. I would consider the patient for  treatment for at least one year with anticoagulation. He would continue his maintenance treatment with Revlimid. He was advised to call immediately if he has any concerning symptoms in the interval.  Disclaimer: This note was dictated with voice recognition software. Similar sounding words can inadvertently be transcribed and may be missed upon review. Eilleen Kempf., MD 02/21/2014

## 2014-02-12 NOTE — Telephone Encounter (Signed)
Pt confirmed labs/ov per 058/05 POF, gave pt AVS....KJ

## 2014-02-12 NOTE — Progress Notes (Signed)
Faxed xarelto pa form to [employer]

## 2014-02-13 ENCOUNTER — Telehealth: Payer: Self-pay | Admitting: *Deleted

## 2014-02-13 NOTE — Telephone Encounter (Signed)
Pt is going to take a trip to [place] from August 31st to Oct 1st.  Spoke with [employer] and [facility] regarding how to give pt enough revlimid while out of the country for 4 weeks.    His next auth # to refill his Revlimid rx can be done on 02/18/14.  [employer] recommends we fill his next rx on 02/18/14.  It was recommended that we can obtain an auth # for another revlimid rx on August 24th (1 week before he leaves for [place]) and request a vacation override.  Then he will have sufficient supplies for his trip.  Pt is aware of the plan and will call on 03/03/14 to f/u with rx status.  SLJ

## 2014-02-15 NOTE — Patient Instructions (Signed)
Begin Xarelto as prescribed Discontinue Lovenox Followup in early October when you return from [place]

## 2014-02-17 ENCOUNTER — Ambulatory Visit (INDEPENDENT_AMBULATORY_CARE_PROVIDER_SITE_OTHER): Payer: Medicare Other | Admitting: Family Medicine

## 2014-02-17 ENCOUNTER — Encounter: Payer: Self-pay | Admitting: Family Medicine

## 2014-02-17 VITALS — BP 122/80 | HR 64 | Ht 69.0 in | Wt 212.0 lb

## 2014-02-17 DIAGNOSIS — I82401 Acute embolism and thrombosis of unspecified deep veins of right lower extremity: Secondary | ICD-10-CM

## 2014-02-17 DIAGNOSIS — K219 Gastro-esophageal reflux disease without esophagitis: Secondary | ICD-10-CM

## 2014-02-17 DIAGNOSIS — I82409 Acute embolism and thrombosis of unspecified deep veins of unspecified lower extremity: Secondary | ICD-10-CM

## 2014-02-17 DIAGNOSIS — C9000 Multiple myeloma not having achieved remission: Secondary | ICD-10-CM

## 2014-02-17 DIAGNOSIS — I2699 Other pulmonary embolism without acute cor pulmonale: Secondary | ICD-10-CM

## 2014-02-17 DIAGNOSIS — K5900 Constipation, unspecified: Secondary | ICD-10-CM | POA: Insufficient documentation

## 2014-02-17 NOTE — Patient Instructions (Signed)
  Increase Colace to 2 tablets daily. Ensure drinking at least 8 glasses of water daily (extra for every cup of caffeine you have)  Consider using Miralax (1 capful daily until you see results with softer, normal stools).  Some people need to use this regularly to help keep stools normal--sometimes 1/2 capful daily, vs just using it a few times/week.  Continue the Xarelto daily.  Feel free to contact me after your next visit with Dr. Earlie Server if you have questions.

## 2014-02-17 NOTE — Progress Notes (Signed)
Chief Complaint  Patient presents with  . Follow-up    no longer taking coumadin, wants to know if we can check an PT/INR today. [facility] form.    Patient presents for follow-up of DVT and PE with questions.  He overall is feeling much better.   The morning that he went to pharmacy to get lovenox (as recommended by his oncologist), he started feeling bad (chest pain and bloating). He was sent for CT angio, where PE was diagnosed.  He was admitted to the hospitalize for anticoagulation.  He was treated with Lovenox for about 4 days, then changed to Xarelto (mainly related to upcoming 1 month trip to [place], prefers oral over injections).  He is asking about monitoring, and seemed upset that he never had protimes followed when he was on coumadin (just on low, subtherapeutic dose).  RLE feels much better--still a little tender over the superficial vein, but calf feels better. R chest still feels a little sore but denies any shortness of breath. He still feels bloated in upper abdomen.  He is compliant with his PPI, denies heartburn He has been out walking the last 3 days.  Last saw Dr. Earlie Server last Wed.  Myeloma panel was normal.  He was told that he will remain on the Revlimid longterm.  Past Medical History  Diagnosis Date  . GERD (gastroesophageal reflux disease)   . Adenomatous polyp of colon 1996  . Arthritis     Osteoarthritis right knee, spine, wrist  . Benign prostatic hypertrophy     (Dr. Karsten Ro)  . History of chronic prostatitis   . History of melanoma     Back (Dr. Wilhemina Bonito); early melanoma R arm 03/2011  . Diverticulosis   . Arthritis of hand, right     Right thumb (Dr. Daylene Katayama)  . Degenerative disc disease   . Hemorrhoids   . Erosive esophagitis   . Back ache     r/o Multiple Myeloma  . Multiple myeloma 2013  . DVT (deep venous thrombosis) 01/2014    R calf  . Pulmonary embolism 01/2014   Past Surgical History  Procedure Laterality Date  . Total knee replaced  2000     Left knee  . Appendectomy    . Colonoscopy  2009  . Esophagogastroduodenoscopy  2009    mild inflammation at esophagogastric junction  . Excision of melanoma      back ( x3)  . Great toe surgery      bilateral  . Inguinal hernia repair      left, x 2  . Total knee arthroplasty  06/08/2011    Procedure: TOTAL KNEE ARTHROPLASTY;  Surgeon: Ninetta Lights, MD;  Location: [facility];  Service: Orthopedics;  Laterality: Right;  osteonics  . [place]  10/2012   History   Social History  . Marital Status: Married    Spouse Name: N/A    Number of Children: 2  . Years of Education: N/A   Occupational History  . mortgage banking retired    Social History Main Topics  . Smoking status: Former Smoker -- 3.00 packs/day for 10 years    Types: Cigarettes    Quit date: 07/11/1976  . Smokeless tobacco: Never Used     Comment: 3 PPD x 6 years  . Alcohol Use: 1.5 oz/week    3 drink(s) per week     Comment: 2 glasses of wine daily  . Drug Use: No  . Sexual Activity: Yes    Partners:  Female   Other Topics Concern  . Not on file   Social History Narrative  . No narrative on file   Outpatient Encounter Prescriptions as of 02/17/2014  Medication Sig Note  . acetaminophen (TYLENOL) 325 MG tablet Take 650 mg by mouth. prn   . ascorbic acid (VITAMIN C) 500 MG tablet Take 500 mg by mouth daily.   . B Complex-C (B-COMPLEX WITH VITAMIN C) tablet Take 1 tablet by mouth daily.   . cholecalciferol (VITAMIN D) 1000 UNITS tablet Take 1,000 Units by mouth daily.   . fish oil-omega-3 fatty acids 1000 MG capsule Take 1 g by mouth daily.    . HYDROcodone-acetaminophen (NORCO) 7.5-325 MG per tablet Take 1 tablet by mouth every 6 (six) hours as needed for moderate pain.   . lenalidomide (REVLIMID) 15 MG capsule Take 1 capsule (15 mg total) by mouth daily.   . Multiple Vitamin (MULTIVITAMIN WITH MINERALS) TABS Take 1 tablet by mouth daily.   . pantoprazole (PROTONIX) 40 MG tablet Take 40 mg by mouth. Take  1 tablet (40 mg total) by mouth Two (2) times a day.   . Probiotic Product (ALIGN) 4 MG CAPS Take 1 capsule by mouth daily.   . Rivaroxaban (XARELTO STARTER PACK) 15 & 20 MG TBPK Take as directed on package: Start with one 15mg tablet by mouth twice a day with food. On Day 22, switch to one 20mg tablet once a day with food.   . zolendronic acid (ZOMETA) 4 MG/5ML injection Inject 4 mg into the vein every 28 (twenty-eight) days.   . ondansetron (ZOFRAN-ODT) 4 MG disintegrating tablet Take 4 mg by mouth every 12 (twelve) hours as needed for nausea. 01/21/2014: Not needed  . oxycodone (OXY-IR) 5 MG capsule Take 5 mg by mouth every 6 (six) hours as needed for pain. 01/21/2014: Not needed  . prochlorperazine (COMPAZINE) 10 MG tablet 10 mg every 6 (six) hours as needed.  01/21/2014: Not needed  . rivaroxaban (XARELTO) 20 MG TABS tablet Take 1 tablet (20 mg total) by mouth daily with supper.   . [DISCONTINUED] enoxaparin (LOVENOX) 100 MG/ML injection Inject 1 mL (100 mg total) into the skin every 12 (twelve) hours.   . [DISCONTINUED] methocarbamol (ROBAXIN) 500 MG tablet Take 1 tablet (500 mg total) by mouth at bedtime as needed for muscle spasms.   . [DISCONTINUED] temazepam (RESTORIL) 15 MG capsule Take 1 capsule (15 mg total) by mouth at bedtime. Take 1 capsule (15 mg total) by mouth nightly as needed for sleep.    Allergies  Allergen Reactions  . Penicillins     REACTION: rash   ROS:  No significant bleeding--He had a scab on his left ankle, stopped easily with pressure. Bowels are hard, even with stool softener. Doesn't empty well.  Denies nausea, vomiting, heartburn, just the bloating.  He denies URI symptoms, cough, shortness of breath, sore throat, sinus pressure, urinary complaints or other concerns.  PHYSICAL EXAM: BP 122/80  Pulse 64  Ht 5' 9" (1.753 m)  Wt 212 lb (96.163 kg)  BMI 31.29 kg/m2 Well developed, well-appearing, pleasant male in no distress Neck: no lymphadenopathy or  mass Heart: regular rate and rhythm Lungs: clear bilaterally Abdomen: +epigastric tenderness.  No rebound tenderness or guarding, no organomegaly or mass.  Normal bowel sounds Extremities:  Mildly tender cord at right medial calf. No significant erythema.  No pitting edema, 2+ pulses Neuro: alert and oriented normal cranial nerves, gait Psych: normal mood, affect, hygiene and grooming    ASSESSMENT/PLAN:  DVT of leg (deep venous thrombosis), right - continue Xarelto.    Acute pulmonary embolism - persistent epigastric pain/bloating that might be related to PE.  Continue PPI regularly. expect improvement in pain gradually  Gastroesophageal reflux disease, esophagitis presence not specified - controlled  Multiple myeloma - counts are stable, doing well s/p transplant  Unspecified constipation  Increase Colace to 2 tablets daily. Ensure drinking at least 8 glasses of water daily (extra for every cup of caffeine you have)  Consider using Miralax (1 capful daily until you see results with softer, normal stools).  Some people need to use this regularly to help keep stools normal--sometimes 1/2 capful daily, vs just using it a few times/week.  Discussed recommendation for f/u U/S in 3-6 mos (to be ordered by hematologist a his f/u with them in October  PE and DVT while on Revlimid.  We discussed risks/benefits, side effects and monitoring with coumadin (when using therapeutic doses) vs Xarelto and differences (reversibility, monitoring, etc). Since he is to stay on Revlimid longterm, I suspect that hematologist likely will suggest coumadin vs xarelto longterm.  Discussed proper monitoring, diet ,interactions with other meds etc.  30 minute visit, more than 1/2 spent counseling.  

## 2014-02-18 ENCOUNTER — Telehealth: Payer: Self-pay | Admitting: Medical Oncology

## 2014-02-18 ENCOUNTER — Other Ambulatory Visit: Payer: Self-pay | Admitting: Medical Oncology

## 2014-02-18 DIAGNOSIS — C9000 Multiple myeloma not having achieved remission: Secondary | ICD-10-CM

## 2014-02-18 MED ORDER — LENALIDOMIDE 15 MG PO CAPS: 15.0000 mg | ORAL_CAPSULE | Freq: Every day | ORAL | Status: DC

## 2014-02-18 NOTE — Telephone Encounter (Signed)
[facility] processing rx for revlimd

## 2014-02-18 NOTE — Telephone Encounter (Signed)
[facility] confirmed receipt of revlimid refill.

## 2014-02-18 NOTE — Telephone Encounter (Signed)
revlimid refill sent to [facility]

## 2014-02-20 ENCOUNTER — Encounter: Payer: Self-pay | Admitting: Family Medicine

## 2014-02-21 ENCOUNTER — Other Ambulatory Visit: Payer: Self-pay | Admitting: Medical Oncology

## 2014-02-21 ENCOUNTER — Telehealth: Payer: Self-pay | Admitting: Medical Oncology

## 2014-02-21 NOTE — Telephone Encounter (Signed)
Message copied by Ardeen Garland on Fri Feb 21, 2014 12:39 PM ------      Message from: Curt Bears      Created: Wed Feb 19, 2014  7:31 PM       Yes it is concerning if it keep rising. I am monitoring the trend closely.      ----- Message -----         From: Ardeen Garland, RN         Sent: 02/18/2014  11:05 AM           To: Curt Bears, MD            Asking about increase in light chains -should he be concerned , what does this mean.       ------

## 2014-02-21 NOTE — Telephone Encounter (Signed)
[name] notified. Does [name] want myeloma panel drawn prior to oct visit?  Note to [place].

## 2014-02-24 ENCOUNTER — Telehealth: Payer: Self-pay | Admitting: *Deleted

## 2014-02-24 ENCOUNTER — Other Ambulatory Visit: Payer: Self-pay | Admitting: Dermatology

## 2014-02-24 DIAGNOSIS — C9000 Multiple myeloma not having achieved remission: Secondary | ICD-10-CM

## 2014-02-24 NOTE — Telephone Encounter (Signed)
Message copied by Britt Bottom on Mon Feb 24, 2014  2:04 PM ------      Message from: Ardeen Garland      Created: Mon Feb 24, 2014  9:07 AM       fyi      ----- Message -----         From: Curt Bears, MD         Sent: 02/21/2014   7:43 PM           To: Ardeen Garland, RN            Few days before the visit      ----- Message -----         From: Ardeen Garland, RN         Sent: 02/21/2014  12:48 PM           To: Curt Bears, MD            Do you want myeloma panel drawn prior to oct visit or same day of visit?            Last panel done July 29th                         ------

## 2014-02-24 NOTE — Telephone Encounter (Signed)
Called and left msg for pt that he needs to come in 10/5 for lab appt at 8:30am.  SLJ

## 2014-02-28 ENCOUNTER — Other Ambulatory Visit: Payer: Self-pay | Admitting: *Deleted

## 2014-02-28 MED ORDER — RIVAROXABAN 20 MG PO TABS: 20.0000 mg | ORAL_TABLET | Freq: Every day | ORAL | Status: DC

## 2014-02-28 NOTE — Telephone Encounter (Signed)
[facility] out patient pharmacy sent a fax requesting for xarelto 20mg  for qty 90 since pt is going out of the country.  Rx e-prescribed in to [facility].  SLJ

## 2014-03-03 ENCOUNTER — Other Ambulatory Visit: Payer: Self-pay | Admitting: *Deleted

## 2014-03-03 DIAGNOSIS — C9000 Multiple myeloma not having achieved remission: Secondary | ICD-10-CM

## 2014-03-03 MED ORDER — LENALIDOMIDE 15 MG PO CAPS
15.0000 mg | ORAL_CAPSULE | Freq: Every day | ORAL | Status: DC
Start: 2014-03-03 — End: 2014-04-07

## 2014-03-04 ENCOUNTER — Telehealth: Payer: Self-pay | Admitting: Family Medicine

## 2014-03-04 NOTE — Telephone Encounter (Signed)
Pt called and made two follow up appts at the suggestion of the cancer center at [place]. They also what him to have booster vaccines. I am sending the orders back in your folder.

## 2014-03-05 NOTE — Telephone Encounter (Signed)
Spoke with patient and he had the first series of the injections yesterday. He already scheduled the future appts here for the next ones. He did want me to let you know that his oncologist recommended that he wait 3 months to have colonoscopy due to the PE and being on Xarelto. He will come in for high dose flu vaccine when he returns from [place] 1 st week of October.

## 2014-03-05 NOTE — Telephone Encounter (Signed)
It appears that recommendations are for pediarix (DTap, Hep B and IPV) x 3 doses as on sheet, with first dose now, along with HIB and prevnar-13 (and pneumovax in 1 year).  Double check if he has already received flu shot, if not, likely wait until mid-September for the high dose flu shot.

## 2014-03-11 ENCOUNTER — Encounter: Payer: Self-pay | Admitting: Family Medicine

## 2014-03-17 ENCOUNTER — Encounter: Payer: Self-pay | Admitting: Family Medicine

## 2014-03-18 MED ORDER — METHOCARBAMOL 500 MG PO TABS: 500.0000 mg | ORAL_TABLET | Freq: Four times a day (QID) | ORAL | Status: DC | PRN

## 2014-03-31 ENCOUNTER — Other Ambulatory Visit: Payer: Self-pay | Admitting: *Deleted

## 2014-03-31 NOTE — Telephone Encounter (Signed)
THIS REFILL REQUEST FOR REVLIMID WAS PLACED ON DR.[name]'S DESK.

## 2014-04-07 ENCOUNTER — Other Ambulatory Visit: Payer: Self-pay | Admitting: Medical Oncology

## 2014-04-07 DIAGNOSIS — C9000 Multiple myeloma not having achieved remission: Secondary | ICD-10-CM

## 2014-04-07 MED ORDER — LENALIDOMIDE 15 MG PO CAPS: 15.0000 mg | ORAL_CAPSULE | Freq: Every day | ORAL | Status: DC

## 2014-04-07 NOTE — Telephone Encounter (Signed)
Revlimid refill sent to [facility]

## 2014-04-14 ENCOUNTER — Other Ambulatory Visit (HOSPITAL_BASED_OUTPATIENT_CLINIC_OR_DEPARTMENT_OTHER): Payer: Medicare Other

## 2014-04-14 ENCOUNTER — Encounter: Payer: Self-pay | Admitting: Family Medicine

## 2014-04-14 ENCOUNTER — Ambulatory Visit (INDEPENDENT_AMBULATORY_CARE_PROVIDER_SITE_OTHER): Payer: Medicare Other | Admitting: Family Medicine

## 2014-04-14 VITALS — BP 102/62 | HR 60 | Temp 98.3°F | Ht 69.0 in | Wt 215.0 lb

## 2014-04-14 DIAGNOSIS — C9000 Multiple myeloma not having achieved remission: Secondary | ICD-10-CM

## 2014-04-14 DIAGNOSIS — I82401 Acute embolism and thrombosis of unspecified deep veins of right lower extremity: Secondary | ICD-10-CM

## 2014-04-14 DIAGNOSIS — R350 Frequency of micturition: Secondary | ICD-10-CM

## 2014-04-14 DIAGNOSIS — R109 Unspecified abdominal pain: Secondary | ICD-10-CM

## 2014-04-14 DIAGNOSIS — N3000 Acute cystitis without hematuria: Secondary | ICD-10-CM

## 2014-04-14 DIAGNOSIS — R197 Diarrhea, unspecified: Secondary | ICD-10-CM

## 2014-04-14 DIAGNOSIS — R300 Dysuria: Secondary | ICD-10-CM

## 2014-04-14 LAB — POCT URINALYSIS DIPSTICK
Bilirubin, UA: NEGATIVE
GLUCOSE UA: NEGATIVE
Ketones, UA: NEGATIVE
Nitrite, UA: NEGATIVE
Protein, UA: NEGATIVE
RBC UA: NEGATIVE
Spec Grav, UA: 1.01
UROBILINOGEN UA: NEGATIVE
pH, UA: 5

## 2014-04-14 LAB — COMPREHENSIVE METABOLIC PANEL (CC13)
ALBUMIN: 3.4 g/dL — AB (ref 3.5–5.0)
ALT: 27 U/L (ref 0–55)
ANION GAP: 7 meq/L (ref 3–11)
AST: 23 U/L (ref 5–34)
Alkaline Phosphatase: 52 U/L (ref 40–150)
BUN: 8.8 mg/dL (ref 7.0–26.0)
CALCIUM: 9.3 mg/dL (ref 8.4–10.4)
CHLORIDE: 110 meq/L — AB (ref 98–109)
CO2: 25 meq/L (ref 22–29)
CREATININE: 0.9 mg/dL (ref 0.7–1.3)
Glucose: 89 mg/dl (ref 70–140)
POTASSIUM: 3.8 meq/L (ref 3.5–5.1)
SODIUM: 142 meq/L (ref 136–145)
TOTAL PROTEIN: 6.8 g/dL (ref 6.4–8.3)
Total Bilirubin: 0.56 mg/dL (ref 0.20–1.20)

## 2014-04-14 LAB — CBC WITH DIFFERENTIAL/PLATELET
BASO%: 5.6 % — ABNORMAL HIGH (ref 0.0–2.0)
Basophils Absolute: 0.2 10*3/uL — ABNORMAL HIGH (ref 0.0–0.1)
EOS ABS: 0.3 10*3/uL (ref 0.0–0.5)
EOS%: 10.6 % — ABNORMAL HIGH (ref 0.0–7.0)
HCT: 40.2 % (ref 38.4–49.9)
HGB: 13.7 g/dL (ref 13.0–17.1)
LYMPH#: 0.8 10*3/uL — AB (ref 0.9–3.3)
LYMPH%: 26.8 % (ref 14.0–49.0)
MCH: 30.9 pg (ref 27.2–33.4)
MCHC: 34.1 g/dL (ref 32.0–36.0)
MCV: 90.7 fL (ref 79.3–98.0)
MONO#: 0.3 10*3/uL (ref 0.1–0.9)
MONO%: 8.8 % (ref 0.0–14.0)
NEUT%: 48.2 % (ref 39.0–75.0)
NEUTROS ABS: 1.4 10*3/uL — AB (ref 1.5–6.5)
NRBC: 0 % (ref 0–0)
PLATELETS: 167 10*3/uL (ref 140–400)
RBC: 4.43 10*6/uL (ref 4.20–5.82)
RDW: 14.5 % (ref 11.0–14.6)
WBC: 2.8 10*3/uL — AB (ref 4.0–10.3)

## 2014-04-14 LAB — LACTATE DEHYDROGENASE (CC13): LDH: 140 U/L (ref 125–245)

## 2014-04-14 MED ORDER — CIPROFLOXACIN HCL 500 MG PO TABS: 500.0000 mg | ORAL_TABLET | Freq: Two times a day (BID) | ORAL | Status: DC

## 2014-04-14 NOTE — Patient Instructions (Signed)
Diarrhea Diarrhea is frequent loose and watery bowel movements. It can cause you to feel weak and dehydrated. Dehydration can cause you to become tired and thirsty, have a dry mouth, and have decreased urination that often is dark yellow. Diarrhea is a sign of another problem, most often an infection that will not last long. In most cases, diarrhea typically lasts 2-3 days. However, it can last longer if it is a sign of something more serious. It is important to treat your diarrhea as directed by your caregiver to lessen or prevent future episodes of diarrhea. CAUSES  Some common causes include:  Gastrointestinal infections caused by viruses, bacteria, or parasites.  Food poisoning or food allergies.  Certain medicines, such as antibiotics, chemotherapy, and laxatives.  Artificial sweeteners and fructose.  Digestive disorders. HOME CARE INSTRUCTIONS  Ensure adequate fluid intake (hydration): Have 1 cup (8 oz) of fluid for each diarrhea episode. Avoid fluids that contain simple sugars or sports drinks, fruit juices, whole milk products, and sodas. Your urine should be clear or pale yellow if you are drinking enough fluids. Hydrate with an oral rehydration solution that you can purchase at pharmacies, retail stores, and online. You can prepare an oral rehydration solution at home by mixing the following ingredients together:   - tsp table salt.   tsp baking soda.   tsp salt substitute containing potassium chloride.  1  tablespoons sugar.  1 L (34 oz) of water.  Certain foods and beverages may increase the speed at which food moves through the gastrointestinal (GI) tract. These foods and beverages should be avoided and include:  Caffeinated and alcoholic beverages.  High-fiber foods, such as raw fruits and vegetables, nuts, seeds, and whole grain breads and cereals.  Foods and beverages sweetened with sugar alcohols, such as xylitol, sorbitol, and mannitol.  Some foods may be well  tolerated and may help thicken stool including:  Starchy foods, such as rice, toast, pasta, low-sugar cereal, oatmeal, grits, baked potatoes, crackers, and bagels.  Bananas.  Applesauce.  Add probiotic-rich foods to help increase healthy bacteria in the GI tract, such as yogurt and fermented milk products.  Wash your hands well after each diarrhea episode.  Only take over-the-counter or prescription medicines as directed by your caregiver.  Take a warm bath to relieve any burning or pain from frequent diarrhea episodes. SEEK IMMEDIATE MEDICAL CARE IF:   You are unable to keep fluids down.  You have persistent vomiting.  You have blood in your stool, or your stools are black and tarry.  You do not urinate in 6-8 hours, or there is only a small amount of very dark urine.  You have abdominal pain that increases or localizes.  You have weakness, dizziness, confusion, or light-headedness.  You have a severe headache.  Your diarrhea gets worse or does not get better.  You have a fever or persistent symptoms for more than 2-3 days.  You have a fever and your symptoms suddenly get worse. MAKE SURE YOU:   Understand these instructions.  Will watch your condition.  Will get help right away if you are not doing well or get worse. Document Released: 06/17/2002 Document Revised: 11/11/2013 Document Reviewed: 03/04/2012 [facility] Patient Information 2015 [place], [place]. This information is not intended to replace advice given to you by your health care provider. Make sure you discuss any questions you have with your health care provider.  Drink plenty of fluids to stay well hydrated.  Avoid all dairy (lactose) for 5-7 days.  Use imodium as needed for diarrhea (but STOP if we tell you there is C. Diff infection).  Lactose-Free Diet Lactose is a carbohydrate that is found mainly in milk and milk products, as well as in foods with added milk or whey. Lactose must be digested by the  enzyme lactase in order to be used by the body. Lactose intolerance occurs when there is a shortage of lactase. When your body is not able to digest lactose, you may feel sick to your stomach (nausea), bloated, and have cramps, gas, and diarrhea. TYPES OF LACTASE DEFICIENCY  Primary lactase deficiency. This is the most common type. It is characterized by a slow decrease in lactase activity.  Secondary lactase deficiency. This occurs following injury to the small intestinal mucosa as a result of a disease or condition. It can also occur as a result of surgery or after treatment with antibiotic medicines or cancer drugs. Tolerance to lactose varies widely. Each person must determine how much milk can be consumed without developing symptoms. Drinking smaller portions of milk throughout the day may be helpful. Some studies suggest that slowing gastric emptying may help increase tolerance of milk products. This may be done by:  Consuming milk or milk products with a meal rather than alone.  Consuming milk with a higher fat content. There are many dairy products that may be tolerated better than milk by some people, including:  Cheese (especially aged cheese). The lactose content is much lower than in milk.  Cultured dairy products, such as yogurt, buttermilk, cottage cheese, and sweet acidophilus milk (kefir). These products are usually well tolerated by lactase-deficient people. This is because the healthy bacteria help digest lactose.  Lactose-hydrolyzed milk. This product contains 40% to 90% less lactose than milk and may also be well tolerated. ADEQUACY These diets may be deficient in calcium, riboflavin, and vitamin D, according to the Recommended Dietary Allowances of the [employer]. Depending on individual tolerances and the use of milk substitutes, milk, or other dairy products, you may be able to meet these recommendations. SPECIAL NOTES  Lactose is a carbohydrate. The main  food source for lactose is dairy products. Reading food labels is important. Many products contain lactose even when they are not made from milk. Look for the following words: whey, milk solids, dry milk solids, nonfat dry milk powder. Typical sources of lactose other than dairy products include breads, candies, cold cuts, prepared and processed foods, and commercial sauces and gravies.  All foods must be prepared without milk, cream, or other dairy foods.  A vitamin or mineral supplement may be necessary. Consult your caregiver or Registered Dietitian.  Lactose is also found in many prescription and over-the-counter medicines.  Soy milk and lactose-free supplements may be used as an alternative to milk. CHOOSING FOODS Breads and Starches  Allowed: Breads and rolls made without milk. [place], [place], or [place] bread. Soda crackers, graham crackers. Any crackers prepared without lactose. Cooked or dry cereals prepared without lactose (read labels). Any potatoes, pasta, or rice prepared without milk or lactose. Popcorn.  Avoid: Breads and rolls that contain milk. Prepared mixes such as muffins, biscuits, waffles, pancakes. Sweet rolls, donuts, [place] toast (if made with milk or lactose). Zwieback crackers, corn curls, or any crackers that contain lactose. Cooked or dry cereals prepared with lactose (read labels). Instant potatoes, frozen [place] fries, scalloped or au gratin potatoes. Vegetables  Allowed: Fresh, frozen, and canned vegetables.  Avoid: Creamed or breaded vegetables. Vegetables in a cheese sauce or  with lactose-containing margarines. Fruit  Allowed: All fresh, canned, or frozen fruits that are not processed with lactose.  Avoid: Any canned or frozen fruits processed with lactose. Meat and Meat Substitutes  Allowed: Plain beef, chicken, fish, [place], lamb, veal, pork, or ham. Kosher prepared meat products. Strained or junior meats that do not contain milk. Eggs, soy meat  substitutes, nuts.  Avoid: Scrambled eggs, omelets, and souffles that contain milk. Creamed or breaded meat, fish, or fowl. Sausage products such as wieners, liver sausage, or cold cuts that contain milk solids. Cheese, cottage cheese, or cheese spreads. Milk  Allowed: None.  Avoid: Milk (whole, 2%, skim, or chocolate). Evaporated, powdered, or condensed milk. Malted milk. Soups and Combination Foods  Allowed: Bouillon, broth, vegetable soups, clear soups, consomms. Homemade soups made with allowed ingredients. Combination or prepared foods that do not contain milk or milk products (read labels).  Avoid: Cream soups, chowders, commercially prepared soups containing lactose. Macaroni and cheese, pizza. Combination or prepared foods that contain milk or milk products. Desserts and Sweets  Allowed: Water and fruit ices, gelatin, angel food cake. Homemade cookies, pies, or cakes made from allowed ingredients. Pudding (if made with water or a milk substitute). Lactose-free tofu desserts. Sugar, honey, corn syrup, jam, jelly, marmalade, molasses (beet sugar). Pure sugar candy, marshmallows.  Avoid: Ice cream, ice milk, sherbet, custard, pudding, frozen yogurt. Commercial cake and cookie mixes. Desserts that contain chocolate. Pie crust made with milk-containing margarine. Reduced calorie desserts made with a sugar substitute that contains lactose. Toffee, peppermint, butterscotch, chocolate, caramels. Fats and Oils  Allowed: Butter (as tolerated, contains very small amounts of lactose). Margarines and dressings that do not contain milk. Vegetable oils, shortening, mayonnaise, nondairy cream and whipped toppings without lactose or milk solids added. Berniece Salines.  Avoid: Margarines and salad dressings containing milk. Cream, cream cheese, peanut butter with added milk solids, sour cream, chip dips made with sour cream. Beverages  Allowed: Carbonated drinks, tea, coffee and freeze-dried coffee, some  instant coffees (check labels). Fruit drinks, fruit and vegetable juice, rice or soy milk.  Avoid: Hot chocolate. Some cocoas, some instant coffees, instant iced teas, powdered fruit drinks (read labels). Condiments  Allowed: Soy sauce, carob powder, olives, gravy made with water, baker's cocoa, pickles, pure seasonings and spices, wine, pure monosodium glutamate, catsup, mustard.  Avoid: Some chewing gums, chocolate, some cocoas. Certain antibiotics and vitamin or mineral preparations. Spice blends if they contain milk products. MSG extender. Artificial sweeteners that contain lactose. Some nondairy creamers (read labels). SAMPLE MENU Breakfast  Orange juice.  Banana.  Bran cereal.  Nondairy creamer.  [place] bread, toasted.  Butter or milk-free margarine.  Coffee or tea. Lunch  Chicken breast.  Rice.  Green beans.  Butter or milk-free margarine.  Fresh melon.  Coffee or tea. Dinner  [employer].  Baked potato.  Butter or milk-free margarine.  Broccoli.  Lettuce salad with vinegar and oil dressing.  [employer]  Coffee or tea. Document Released: 12/17/2001 Document Revised: 09/19/2011 Document Reviewed: 09/27/2013 [facility] Patient Information 2015 [place], [place]. This information is not intended to replace advice given to you by your health care provider. Make sure you discuss any questions you have with your health care provider.   We are also presumptively treating you for bladder infection.  If your culture results are negative, you will be asked to discontinue the antibiotics.  You can try simethicone (Gas-X) if needed for bloating and sharp gassy type of abdominal pain.

## 2014-04-14 NOTE — Progress Notes (Signed)
Chief Complaint  Patient presents with  . Abdominal Cramping    and diarrhea. Thinks he caught in [place] x 12 days ago. Thinks he may have picked up some kind of parasite.    10 days ago, while in [place] he developed loose stools (runny, semi-formed) and abdominal cramping.  Having loose stools about 4-5 times/day.  No blood in the stool, no fevers. He is having cramping just prior to having loose stool, but some slight persistent cramping throughout the day.  Pain is diffuse.  Had some nausea only when he had severe pain, but no vomiting.  He tried kaopectate without results.  Denies eating any raw or undercooked meats/seafood. No spoiled foods.  Had a z-pak about 2 months ago for skin infection (?reaction to Revlimid).  No camping/streamwater. He was in nice hotels in [place], [place], [place].  He did drink tap water in the hotels. Nobody else with diarrhea.  Brother-in-law got URI symptoms upon return from trip (was on a different plane).  He did not eat much dairy, but did have a little cheese and gelato during the rest of the trip.  He is also having some burning with urination and urinary frequency for the last 10 days or so. Needed to find restroom in every museum.  DVT/PE--on Xarelto without any complications.  Denies any bleeding, abnormal bruising, shortness of breath or chest pain.  He got support stockings from medical supply store (?only $25?) but reports that he didn't tolerate them--wore them on the flight to [place], and they were way too tight, felt like he was losing feeling in his toes.  Multiple Myeloma:  He had labs drawn this morning, and has f/u on Wednesday. He is in some sort of immunization study and declines flu shot currently--will get with other vaccines next month.  Past Medical History  Diagnosis Date  . GERD (gastroesophageal reflux disease)   . Adenomatous polyp of colon 1996  . Arthritis     Osteoarthritis right knee, spine, wrist  . Benign prostatic  hypertrophy     (Dr. Vernie Ammons)  . History of chronic prostatitis   . History of melanoma     Back (Dr. Karlyn Agee); early melanoma R arm 03/2011  . Diverticulosis   . Arthritis of hand, right     Right thumb (Dr. Teressa Senter)  . Degenerative disc disease   . Hemorrhoids   . Erosive esophagitis   . Back ache     r/o Multiple Myeloma  . Multiple myeloma 2013  . DVT (deep venous thrombosis) 01/2014    R calf  . Pulmonary embolism 01/2014   Past Surgical History  Procedure Laterality Date  . Total knee replaced  2000    Left knee  . Appendectomy    . Colonoscopy  2009    adenomatous polyp  . Esophagogastroduodenoscopy  2009    mild inflammation at esophagogastric junction  . Excision of melanoma      back ( x3)  . Great toe surgery      bilateral  . Inguinal hernia repair      left, x 2  . Total knee arthroplasty  06/08/2011    Procedure: TOTAL KNEE ARTHROPLASTY;  Surgeon: Loreta Ave, MD;  Location: [facility] OR;  Service: Orthopedics;  Laterality: Right;  osteonics  . [place]  10/2012   History   Social History  . Marital Status: Married    Spouse Name: N/A    Number of Children: 2  . Years of Education:  N/A   Occupational History  . mortgage banking retired    Social History Main Topics  . Smoking status: Former Smoker -- 3.00 packs/day for 10 years    Types: Cigarettes    Quit date: 07/11/1976  . Smokeless tobacco: Never Used     Comment: 3 PPD x 6 years  . Alcohol Use: 1.5 oz/week    3 drink(s) per week     Comment: 2 glasses of wine daily  . Drug Use: No  . Sexual Activity: Yes    Partners: Female   Other Topics Concern  . Not on file   Social History Narrative  . No narrative on file   Outpatient Encounter Prescriptions as of 04/14/2014  Medication Sig  . acetaminophen (TYLENOL) 325 MG tablet Take 650 mg by mouth. prn  . ascorbic acid (VITAMIN C) 500 MG tablet Take 500 mg by mouth daily.  . B Complex-C (B-COMPLEX WITH VITAMIN C) tablet Take 1 tablet by  mouth daily.  . cholecalciferol (VITAMIN D) 1000 UNITS tablet Take 1,000 Units by mouth daily.  . fish oil-omega-3 fatty acids 1000 MG capsule Take 1 g by mouth daily.   . Multiple Vitamin (MULTIVITAMIN WITH MINERALS) TABS Take 1 tablet by mouth daily.  . pantoprazole (PROTONIX) 40 MG tablet Take 40 mg by mouth. Take 1 tablet (40 mg total) by mouth Two (2) times a day.  . Probiotic Product (ALIGN) 4 MG CAPS Take 1 capsule by mouth daily.  . rivaroxaban (XARELTO) 20 MG TABS tablet Take 1 tablet (20 mg total) by mouth daily with supper.  . zolendronic acid (ZOMETA) 4 MG/5ML injection Inject 4 mg into the vein every 28 (twenty-eight) days.  . [DISCONTINUED] Rivaroxaban (XARELTO STARTER PACK) 15 & 20 MG TBPK Take as directed on package: Start with one $Remove'15mg'FncDsQa$  tablet by mouth twice a day with food. On Day 22, switch to one $Remo'20mg'FaOFt$  tablet once a day with food.  . ciprofloxacin (CIPRO) 500 MG tablet Take 1 tablet (500 mg total) by mouth 2 (two) times daily.  Marland Kitchen HYDROcodone-acetaminophen (NORCO) 7.5-325 MG per tablet Take 1 tablet by mouth every 6 (six) hours as needed for moderate pain.  Marland Kitchen lenalidomide (REVLIMID) 15 MG capsule Take 1 capsule (15 mg total) by mouth daily.  . methocarbamol (ROBAXIN) 500 MG tablet Take 1-2 tablets (500-1,000 mg total) by mouth every 6 (six) hours as needed for muscle spasms.  . ondansetron (ZOFRAN-ODT) 4 MG disintegrating tablet Take 4 mg by mouth every 12 (twelve) hours as needed for nausea.  Marland Kitchen oxycodone (OXY-IR) 5 MG capsule Take 5 mg by mouth every 6 (six) hours as needed for pain.  Marland Kitchen prochlorperazine (COMPAZINE) 10 MG tablet 10 mg every 6 (six) hours as needed.    (cipro rx'd today, not prior to visit)  Allergies  Allergen Reactions  . Penicillins     REACTION: rash   ROS:  No fevers, chills, weight loss, vomiting, blood in stool, URI symptoms, chest pain, shortness of breath, cough, edema, bleeding, bruising.  Moods are good.  +urinary complaints and GI complaints as per  HPI.  PHYSICAL EXAM: BP 102/62  Pulse 60  Temp(Src) 98.3 F (36.8 C) (Tympanic)  Ht $R'5\' 9"'Wk$  (1.753 m)  Wt 215 lb (97.523 kg)  BMI 31.74 kg/m2 Well developed, pleasant male, speaking of his wonderful trip to [place], appearing comfortable. HEENT: PERRL, EOMI, conjunctiva clear.  OP clear, moist mucus membranes Neck: no lymphadenopathy, thyromegaly or mass Heart: regular rate and rhythm without murmur Lungs:  clear bilaterally Back: no CVA tenderness Abdomen: soft, active bowel sounds. Abdomen is nontender. No organomegaly or mass Extremities: no edema, calves nontender Skin: no rashes Psych: normal mood, affect, hygiene and grooming Neuro: alert and oriented.  Cranial nerves grossly intact. Normal strength, sensation, gait.  Urine dip: 1+ leuks, otherwise negative  Lab Results  Component Value Date   WBC 2.8* 04/14/2014   HGB 13.7 04/14/2014   HCT 40.2 04/14/2014   MCV 90.7 04/14/2014   PLT 167 04/14/2014     Chemistry      Component Value Date/Time   NA 142 04/14/2014 0844   NA 138 02/08/2014 0340   K 3.8 04/14/2014 0844   K 3.5* 02/08/2014 0340   CL 99 02/08/2014 0340   CL 109* 12/31/2012 0759   CO2 25 04/14/2014 0844   CO2 29 02/08/2014 0340   BUN 8.8 04/14/2014 0844   BUN 10 02/08/2014 0340   CREATININE 0.9 04/14/2014 0844   CREATININE 1.04 02/08/2014 0340   CREATININE 0.97 11/18/2013 1045      Component Value Date/Time   CALCIUM 9.3 04/14/2014 0844   CALCIUM 8.4 02/08/2014 0340   ALKPHOS 52 04/14/2014 0844   ALKPHOS 40 11/18/2013 1045   AST 23 04/14/2014 0844   AST 25 11/18/2013 1045   ALT 27 04/14/2014 0844   ALT 33 11/18/2013 1045   BILITOT 0.56 04/14/2014 0844   BILITOT 0.4 11/18/2013 1045      ASSESSMENT/PLAN:  Acute cystitis without hematuria - symptoms are classic, minimally abnormal u/a. Treat with cipro while cx pending - Plan: ciprofloxacin (CIPRO) 500 MG tablet  Abdominal pain, unspecified abdominal location - Plan: POCT Urinalysis Dipstick  Urinary frequency - Plan: Urine  culture  Dysuria  Diarrhea - suspect viral illness, with some persistence related to lactose intolerance. Given low counts, check stool studies  DVT of leg (deep venous thrombosis), right - doing well on Xarelto.  okay to not wear compression stockings.  Multiple myeloma   Possible UTI.  Treat presumptively with cipro Diarrhea--DDx reviewed in detail.  ANC 800. Okay to use imodium (will stop if C. Diff +). Collect stool samples. Lactose-free diet.

## 2014-04-16 ENCOUNTER — Telehealth: Payer: Self-pay | Admitting: Internal Medicine

## 2014-04-16 ENCOUNTER — Other Ambulatory Visit: Payer: Medicare Other

## 2014-04-16 ENCOUNTER — Encounter: Payer: Self-pay | Admitting: Internal Medicine

## 2014-04-16 ENCOUNTER — Ambulatory Visit (HOSPITAL_BASED_OUTPATIENT_CLINIC_OR_DEPARTMENT_OTHER): Payer: Medicare Other | Admitting: Internal Medicine

## 2014-04-16 ENCOUNTER — Ambulatory Visit (HOSPITAL_BASED_OUTPATIENT_CLINIC_OR_DEPARTMENT_OTHER): Payer: Medicare Other

## 2014-04-16 VITALS — BP 123/65 | HR 63 | Temp 98.5°F | Resp 18 | Ht 69.0 in | Wt 217.0 lb

## 2014-04-16 DIAGNOSIS — C9000 Multiple myeloma not having achieved remission: Secondary | ICD-10-CM

## 2014-04-16 DIAGNOSIS — Z23 Encounter for immunization: Secondary | ICD-10-CM

## 2014-04-16 DIAGNOSIS — I2699 Other pulmonary embolism without acute cor pulmonale: Secondary | ICD-10-CM

## 2014-04-16 LAB — URINE CULTURE
Colony Count: NO GROWTH
ORGANISM ID, BACTERIA: NO GROWTH

## 2014-04-16 LAB — BETA 2 MICROGLOBULIN, SERUM: Beta-2 Microglobulin: 2.74 mg/L — ABNORMAL HIGH (ref <=2.51)

## 2014-04-16 LAB — KAPPA/LAMBDA LIGHT CHAINS
KAPPA LAMBDA RATIO: 1.48 (ref 0.26–1.65)
Kappa free light chain: 5.14 mg/dL — ABNORMAL HIGH (ref 0.33–1.94)
Lambda Free Lght Chn: 3.47 mg/dL — ABNORMAL HIGH (ref 0.57–2.63)

## 2014-04-16 LAB — IGG, IGA, IGM
IGM, SERUM: 28 mg/dL — AB (ref 41–251)
IgA: 183 mg/dL (ref 68–379)
IgG (Immunoglobin G), Serum: 1150 mg/dL (ref 650–1600)

## 2014-04-16 MED ORDER — HYDROCODONE-ACETAMINOPHEN 7.5-325 MG PO TABS: 1.0000 | ORAL_TABLET | Freq: Four times a day (QID) | ORAL | Status: DC | PRN

## 2014-04-16 MED ORDER — HEPARIN SOD (PORK) LOCK FLUSH 100 UNIT/ML IV SOLN
500.0000 [IU] | Freq: Once | INTRAVENOUS | Status: AC | PRN
  Administered 2014-04-16: 500 [IU]
  Filled 2014-04-16: qty 5

## 2014-04-16 MED ORDER — INFLUENZA VAC SPLIT QUAD 0.5 ML IM SUSY
0.5000 mL | PREFILLED_SYRINGE | Freq: Once | INTRAMUSCULAR | Status: DC
  Administered 2014-04-16: 0.5 mL via INTRAMUSCULAR
  Filled 2014-04-16: qty 0.5

## 2014-04-16 MED ORDER — SODIUM CHLORIDE 0.9 % IJ SOLN
10.0000 mL | INTRAMUSCULAR | Status: DC | PRN
  Administered 2014-04-16: 10 mL
  Filled 2014-04-16: qty 10

## 2014-04-16 MED ORDER — ZOLEDRONIC ACID 4 MG/100ML IV SOLN
4.0000 mg | Freq: Once | INTRAVENOUS | Status: AC
  Administered 2014-04-16: 4 mg via INTRAVENOUS
  Filled 2014-04-16: qty 100

## 2014-04-16 MED ORDER — PROCHLORPERAZINE MALEATE 10 MG PO TABS: 10.0000 mg | ORAL_TABLET | Freq: Four times a day (QID) | ORAL | Status: DC | PRN

## 2014-04-16 NOTE — Progress Notes (Signed)
[facility] Telephone:([phone]) [phone]   Fax:([phone]) [place], MD [place] [address]  DIAGNOSIS: Stage IIA multiple myeloma diagnosed in July of 2013.   PRIOR THERAPY:  1) Systemic chemotherapy with subcutaneous Velcade 1.3 mg/M2 on days 1, 4, 8 and 11 every 3 weeks in addition to Revlimid 25 mg by mouth daily for 14 days every 3 weeks and dexamethasone 40 mg on a weekly basis. Status post 4 planned cycles.  2) Systemic chemotherapy with subcutaneous Velcade 1.3 mg/M2 on days 1, 4, 8 and 11 every 3 weeks in addition to Revlimid 25 mg by mouth daily for 14 days every 3 weeks and dexamethasone 40 mg on a weekly basis. 4 more cycles are planned and he is status post 4 cycles of the second set of 4 cycles, now status post a total of 10 cycles. 3) Systemic chemotherapy with Carfilzomib 36 mg/M2 on days 1, 2, 8, 9, 15 and 16 in addition to cyclophosphamide 300 mg/M2 IV on days 1, 8 and 15 as well as dexamethasone on days 1, 8, 15 and 22 every 4 weeks. Status post 4 cycles. 4) Zometa 4 mg IV every month. 5) autologous peripheral blood stem cell transplant on 03/20/2013 at [facility]. 6) Consolidation chemotherapy with Carfilzomib 36 mg/M2 on days 1, 2, 8, 9, 15 and 16 every 4 weeks in addition to cyclophosphamide 300 mg/M2 on days 1, 8 and 15 as well as dexamethasone 40 mg on a weekly basis every 4 weeks. Status post 2 cycles. First cycle 06/11/2013. 7) Maintenance therapy with Revlimid 10 mg by mouth daily status post 3 months of treatment.   CURRENT THERAPY:  1) Maintenance therapy with Revlimid 15 mg by mouth daily started 11/09/2013. 2) Zometa 4 mg IV every 2 months. 3) Xarelto 20 mg by mouth daily for pulmonary embolism,.   INTERVAL HISTORY: [name] 73 y.o. male returns to the clinic today for followup visit. He just came back from [place] after spending 3 and half weeks there for vacation. He enjoyed  his time and has had several countries. He is feeling fine today with no specific complaints. He is tolerating his treatment with Revlimid fairly well with no significant adverse effects. He also complains of dry skin. He denied having any significant fatigue or weakness. He denied having any fever or chills. He has no nausea or vomiting.  He denied having any significant chest pain, shortness of breath, cough or hemoptysis. He has repeat myeloma panel performed recently and he is here for evaluation and discussion of his lab results.  MEDICAL HISTORY: Past Medical History  Diagnosis Date  . GERD (gastroesophageal reflux disease)   . Adenomatous polyp of colon 1996  . Arthritis     Osteoarthritis right knee, spine, wrist  . Benign prostatic hypertrophy     (Dr. Karsten Ro)  . History of chronic prostatitis   . History of melanoma     Back (Dr. Wilhemina Bonito); early melanoma R arm 03/2011  . Diverticulosis   . Arthritis of hand, right     Right thumb (Dr. Daylene Katayama)  . Degenerative disc disease   . Hemorrhoids   . Erosive esophagitis   . Back ache     r/o Multiple Myeloma  . Multiple myeloma 2013  . DVT (deep venous thrombosis) 01/2014    R calf  . Pulmonary embolism 01/2014    ALLERGIES:  is allergic to penicillins.  MEDICATIONS:  Current Outpatient Prescriptions  Medication Sig Dispense Refill  . acetaminophen (TYLENOL) 325 MG tablet Take 650 mg by mouth. prn      . ascorbic acid (VITAMIN C) 500 MG tablet Take 500 mg by mouth daily.      . B Complex-C (B-COMPLEX WITH VITAMIN C) tablet Take 1 tablet by mouth daily.      . cholecalciferol (VITAMIN D) 1000 UNITS tablet Take 1,000 Units by mouth daily.      . ciprofloxacin (CIPRO) 500 MG tablet Take 1 tablet (500 mg total) by mouth 2 (two) times daily.  14 tablet  0  . fish oil-omega-3 fatty acids 1000 MG capsule Take 1 g by mouth daily.       Marland Kitchen HYDROcodone-acetaminophen (NORCO) 7.5-325 MG per tablet Take 1 tablet by mouth every 6 (six) hours  as needed for moderate pain.      Marland Kitchen lenalidomide (REVLIMID) 15 MG capsule Take 1 capsule (15 mg total) by mouth daily.  28 capsule  0  . methocarbamol (ROBAXIN) 500 MG tablet Take 1-2 tablets (500-1,000 mg total) by mouth every 6 (six) hours as needed for muscle spasms.  60 tablet  0  . Multiple Vitamin (MULTIVITAMIN WITH MINERALS) TABS Take 1 tablet by mouth daily.      . ondansetron (ZOFRAN-ODT) 4 MG disintegrating tablet Take 4 mg by mouth every 12 (twelve) hours as needed for nausea.      Marland Kitchen oxycodone (OXY-IR) 5 MG capsule Take 5 mg by mouth every 6 (six) hours as needed for pain.      . pantoprazole (PROTONIX) 40 MG tablet Take 40 mg by mouth. Take 1 tablet (40 mg total) by mouth Two (2) times a day.      . Probiotic Product (ALIGN) 4 MG CAPS Take 1 capsule by mouth daily.      . prochlorperazine (COMPAZINE) 10 MG tablet 10 mg every 6 (six) hours as needed.       . rivaroxaban (XARELTO) 20 MG TABS tablet Take 1 tablet (20 mg total) by mouth daily with supper.  90 tablet  0  . zolendronic acid (ZOMETA) 4 MG/5ML injection Inject 4 mg into the vein every 28 (twenty-eight) days.       No current facility-administered medications for this visit.   Facility-Administered Medications Ordered in Other Visits  Medication Dose Route Frequency Provider Last Rate Last Dose  . ondansetron (ZOFRAN) tablet 8 mg  8 mg Oral Once Curt Bears, MD      . sodium chloride 0.9 % injection 10 mL  10 mL Intracatheter PRN Curt Bears, MD   10 mL at 06/11/13 1616    SURGICAL HISTORY:  Past Surgical History  Procedure Laterality Date  . Total knee replaced  2000    Left knee  . Appendectomy    . Colonoscopy  2009    adenomatous polyp  . Esophagogastroduodenoscopy  2009    mild inflammation at esophagogastric junction  . Excision of melanoma      back ( x3)  . Great toe surgery      bilateral  . Inguinal hernia repair      left, x 2  . Total knee arthroplasty  06/08/2011    Procedure: TOTAL KNEE  ARTHROPLASTY;  Surgeon: Ninetta Lights, MD;  Location: [facility];  Service: Orthopedics;  Laterality: Right;  osteonics  . [place]  10/2012    REVIEW OF SYSTEMS:  Constitutional: negative Eyes: negative Ears, nose, mouth, throat, and face:  negative Respiratory: negative Cardiovascular: negative Gastrointestinal: negative Genitourinary:negative Integument/breast: negative Hematologic/lymphatic: negative Musculoskeletal:negative Neurological: negative Behavioral/Psych: negative Endocrine: negative Allergic/Immunologic: negative   PHYSICAL EXAMINATION: General appearance: alert, cooperative and no distress Head: Normocephalic, without obvious abnormality, atraumatic Neck: no adenopathy, no JVD, supple, symmetrical, trachea midline and thyroid not enlarged, symmetric, no tenderness/mass/nodules Lymph nodes: Cervical, supraclavicular, and axillary nodes normal. Resp: clear to auscultation bilaterally Back: symmetric, no curvature. ROM normal. No CVA tenderness. Cardio: regular rate and rhythm, S1, S2 normal, no murmur, click, rub or gallop GI: soft, non-tender; bowel sounds normal; no masses,  no organomegaly Extremities: extremities normal, atraumatic, no cyanosis or edema Neurologic: Alert and oriented X 3, normal strength and tone. Normal symmetric reflexes. Normal coordination and gait  ECOG PERFORMANCE STATUS: 1 - Symptomatic but completely ambulatory  Blood pressure 123/65, pulse 63, temperature 98.5 F (36.9 C), temperature source Oral, resp. rate 18, height $RemoveBe'5\' 9"'PlXgeBFVk$  (1.753 m), weight 217 lb (98.431 kg), SpO2 98.00%.  LABORATORY DATA: Lab Results  Component Value Date   WBC 2.8* 04/14/2014   HGB 13.7 04/14/2014   HCT 40.2 04/14/2014   MCV 90.7 04/14/2014   PLT 167 04/14/2014      Chemistry      Component Value Date/Time   NA 142 04/14/2014 0844   NA 138 02/08/2014 0340   K 3.8 04/14/2014 0844   K 3.5* 02/08/2014 0340   CL 99 02/08/2014 0340   CL 109* 12/31/2012 0759   CO2 25  04/14/2014 0844   CO2 29 02/08/2014 0340   BUN 8.8 04/14/2014 0844   BUN 10 02/08/2014 0340   CREATININE 0.9 04/14/2014 0844   CREATININE 1.04 02/08/2014 0340   CREATININE 0.97 11/18/2013 1045      Component Value Date/Time   CALCIUM 9.3 04/14/2014 0844   CALCIUM 8.4 02/08/2014 0340   ALKPHOS 52 04/14/2014 0844   ALKPHOS 40 11/18/2013 1045   AST 23 04/14/2014 0844   AST 25 11/18/2013 1045   ALT 27 04/14/2014 0844   ALT 33 11/18/2013 1045   BILITOT 0.56 04/14/2014 0844   BILITOT 0.4 11/18/2013 1045       RADIOGRAPHIC STUDIES: No results found.  ASSESSMENT AND PLAN: This is a very pleasant 73 years old white male with history of multiple myeloma status post induction chemotherapy with Velcade, Revlimid and dexamethasone followed by treatment with Carfilzomib, cyclophosphamide and dexamethasone with significant improvement in his disease followed by autologous peripheral blood stem cell transplant at [facility] on 03/20/2013. This was followed by 2 cycles of consolidation chemotherapy with Carfilzomib, Cytoxan and dexamethasone, and he tolerated it fairly well. He completed maintenance Revlimid 10 mg by mouth daily for the last 3 months and tolerating it fairly well. He started maintenance Revlimid with 15 mg by mouth daily last week and tolerating it well. His myeloma panel today showed no significant disease progression but this is a trend of increase of the free kappa light chain. I discussed the lab result with the patient today. I recommended for the patient to continue his treatment with Revlimid as prescribed. He would come back for followup visit in one month. He will continue treatment with Zometa every 2 months. For the pulmonary embolism, he will continue on Xarelto 20 mg by mouth daily as scheduled. The patient will receive flu vaccine today. He was advised to call immediately if he has any concerning symptoms in the interval. The patient voices understanding of current disease status  and treatment options and is in agreement with  the current care plan.  All questions were answered. The patient knows to call the clinic with any problems, questions or concerns. We can certainly see the patient much sooner if necessary.  Disclaimer: This note was dictated with voice recognition software. Similar sounding words can inadvertently be transcribed and may not be corrected upon review.

## 2014-04-16 NOTE — Progress Notes (Signed)
Per Dr [name] it is okay for flu vaccine today.

## 2014-04-16 NOTE — Telephone Encounter (Signed)
gv and printed appt sched and avs for pt for pt for NOV and Dec....sed added tx.

## 2014-04-16 NOTE — Patient Instructions (Signed)

## 2014-04-20 ENCOUNTER — Encounter: Payer: Self-pay | Admitting: Family Medicine

## 2014-04-21 ENCOUNTER — Telehealth: Payer: Self-pay | Admitting: Gastroenterology

## 2014-04-21 ENCOUNTER — Telehealth: Payer: Self-pay | Admitting: Family Medicine

## 2014-04-21 ENCOUNTER — Telehealth: Payer: Self-pay | Admitting: *Deleted

## 2014-04-21 NOTE — Telephone Encounter (Signed)
Left message for patient to call back  

## 2014-04-21 NOTE — Telephone Encounter (Signed)
Advise pt--completely normal stool studies

## 2014-04-21 NOTE — Telephone Encounter (Signed)
Patient will come in for an office visit with Nicoletta Ba PA at 1:30.  He has abdominal pain, cramping he has recent travel to [place].  He is taking align and pantoprazole.  He has been treated by his primary care with no improvement in his symptoms

## 2014-04-21 NOTE — Telephone Encounter (Signed)
Patient advised.

## 2014-04-21 NOTE — Telephone Encounter (Signed)
See other message (all negative)

## 2014-04-21 NOTE — Telephone Encounter (Signed)
Stool studies were up front in a red folder, don't believe you saw them yet. I put on your shelf for your review.

## 2014-04-22 ENCOUNTER — Other Ambulatory Visit (INDEPENDENT_AMBULATORY_CARE_PROVIDER_SITE_OTHER): Payer: Medicare Other

## 2014-04-22 ENCOUNTER — Encounter: Payer: Self-pay | Admitting: Physician Assistant

## 2014-04-22 ENCOUNTER — Ambulatory Visit (INDEPENDENT_AMBULATORY_CARE_PROVIDER_SITE_OTHER): Payer: Medicare Other | Admitting: Physician Assistant

## 2014-04-22 VITALS — BP 106/60 | HR 72 | Ht 69.0 in | Wt 211.5 lb

## 2014-04-22 DIAGNOSIS — R140 Abdominal distension (gaseous): Secondary | ICD-10-CM

## 2014-04-22 DIAGNOSIS — R197 Diarrhea, unspecified: Secondary | ICD-10-CM

## 2014-04-22 DIAGNOSIS — R1084 Generalized abdominal pain: Secondary | ICD-10-CM

## 2014-04-22 DIAGNOSIS — R110 Nausea: Secondary | ICD-10-CM

## 2014-04-22 DIAGNOSIS — C9000 Multiple myeloma not having achieved remission: Secondary | ICD-10-CM

## 2014-04-22 LAB — COMPREHENSIVE METABOLIC PANEL
ALBUMIN: 3.1 g/dL — AB (ref 3.5–5.2)
ALK PHOS: 47 U/L (ref 39–117)
ALT: 22 U/L (ref 0–53)
AST: 18 U/L (ref 0–37)
BILIRUBIN TOTAL: 0.8 mg/dL (ref 0.2–1.2)
BUN: 10 mg/dL (ref 6–23)
CO2: 22 mEq/L (ref 19–32)
Calcium: 8.8 mg/dL (ref 8.4–10.5)
Chloride: 107 mEq/L (ref 96–112)
Creatinine, Ser: 1.1 mg/dL (ref 0.4–1.5)
GFR: 71.16 mL/min (ref >60.00)
Glucose, Bld: 87 mg/dL (ref 70–99)
Potassium: 3.9 mEq/L (ref 3.5–5.1)
SODIUM: 139 meq/L (ref 135–145)
TOTAL PROTEIN: 7 g/dL (ref 6.0–8.3)

## 2014-04-22 LAB — CBC WITH DIFFERENTIAL/PLATELET
BASOS PCT: 2.9 % (ref 0.0–3.0)
Basophils Absolute: 0.1 10*3/uL (ref 0.0–0.1)
EOS ABS: 0.2 10*3/uL (ref 0.0–0.7)
Eosinophils Relative: 5.1 % — ABNORMAL HIGH (ref 0.0–5.0)
HEMATOCRIT: 41.4 % (ref 39.0–52.0)
Hemoglobin: 13.5 g/dL (ref 13.0–17.0)
Lymphocytes Relative: 24.6 % (ref 12.0–46.0)
Lymphs Abs: 0.9 10*3/uL (ref 0.7–4.0)
MCHC: 32.6 g/dL (ref 30.0–36.0)
MCV: 93.3 fl (ref 78.0–100.0)
Monocytes Absolute: 0.3 10*3/uL (ref 0.1–1.0)
Monocytes Relative: 9.3 % (ref 3.0–12.0)
NEUTROS PCT: 58.1 % (ref 43.0–77.0)
Neutro Abs: 2.2 10*3/uL (ref 1.4–7.7)
PLATELETS: 180 10*3/uL (ref 150.0–400.0)
RBC: 4.44 Mil/uL (ref 4.22–5.81)
RDW: 15.3 % (ref 11.5–15.5)
WBC: 3.7 10*3/uL — ABNORMAL LOW (ref 4.0–10.5)

## 2014-04-22 MED ORDER — DICYCLOMINE HCL 10 MG PO CAPS: ORAL_CAPSULE | ORAL | Status: DC

## 2014-04-22 MED ORDER — ONDANSETRON HCL 4 MG PO TABS: ORAL_TABLET | ORAL | Status: DC

## 2014-04-22 MED ORDER — METRONIDAZOLE 250 MG PO TABS: 250.0000 mg | ORAL_TABLET | Freq: Four times a day (QID) | ORAL | Status: AC

## 2014-04-22 NOTE — Progress Notes (Signed)
Reviewed and agree with management plan.  Mariesha Venturella T. Rebekah Zackery, MD FACG 

## 2014-04-22 NOTE — Progress Notes (Signed)
Subjective:    Patient ID: [name], male    DOB: [birth year], 73 y.o.   MRN: [id number]  HPI   [name] is a very nice 73 year old white male known to Dr. Fuller Plan. He has history of multiple myeloma and underwent a stem cell transplant about a year and a half ago, he is currently being maintained on Revlimid  and is followed by Dr. Earlie Server. He unfortunately had a DVT and pulmonary embolus in July of 2015 despite low-dose Coumadin and is now on Xarelto. He has history of adenomatous and hyperplastic polyps as well as diverticulosis. Last colonoscopy was done in 2009 and he is due for followup. He also had class B. esophagitis on endoscopy July 2014. Biopsies of an irregular Z line were negative for Barrett's. He comes in today of with complaints of about 5 week history of abdominal bloating, gas, cramping and diarrhea. He is usually having about 2 loose bowel movements per day, nonbloody. No fever or chills. Appetite has been fair, weight has been stable. He says sometimes the cramps are fairly intense and double him over. He has  also had some nausea, but no vomiting. He has not been on any new medications or had any change in dosages of meds no recent antibiotics etc. He has been taking Align  regularly. Patient did travel to [place] and just returned within the past 2 weeks but says his symptoms started prior to that trip. He is leaving town  later today and will be gone for about a week. Only prior abdominal surgery was an appendectomy.    Review of Systems  Constitutional: Positive for appetite change.  HENT: Negative.   Eyes: Negative.   Respiratory: Negative.   Cardiovascular: Negative.   Gastrointestinal: Positive for nausea, abdominal pain, diarrhea and abdominal distention.  Endocrine: Negative.   Genitourinary: Negative.   Musculoskeletal: Negative.   Allergic/Immunologic: Negative.   Neurological: Negative.   Hematological: Negative.   Psychiatric/Behavioral: Negative.     Outpatient Prescriptions Prior to Visit  Medication Sig Dispense Refill  . acetaminophen (TYLENOL) 325 MG tablet Take 650 mg by mouth. prn      . ascorbic acid (VITAMIN C) 500 MG tablet Take 500 mg by mouth daily.      . B Complex-C (B-COMPLEX WITH VITAMIN C) tablet Take 1 tablet by mouth daily.      . cholecalciferol (VITAMIN D) 1000 UNITS tablet Take 1,000 Units by mouth daily.      . fish oil-omega-3 fatty acids 1000 MG capsule Take 1 g by mouth daily.       Marland Kitchen HYDROcodone-acetaminophen (NORCO) 7.5-325 MG per tablet Take 1 tablet by mouth every 6 (six) hours as needed for moderate pain.  30 tablet  0  . lenalidomide (REVLIMID) 15 MG capsule Take 1 capsule (15 mg total) by mouth daily.  28 capsule  0  . methocarbamol (ROBAXIN) 500 MG tablet Take 1-2 tablets (500-1,000 mg total) by mouth every 6 (six) hours as needed for muscle spasms.  60 tablet  0  . Multiple Vitamin (MULTIVITAMIN WITH MINERALS) TABS Take 1 tablet by mouth daily.      . ondansetron (ZOFRAN-ODT) 4 MG disintegrating tablet Take 4 mg by mouth every 12 (twelve) hours as needed for nausea.      . pantoprazole (PROTONIX) 40 MG tablet Take 40 mg by mouth. Take 1 tablet (40 mg total) by mouth Two (2) times a day.      . Probiotic Product (ALIGN) 4 MG  CAPS Take 1 capsule by mouth daily.      . prochlorperazine (COMPAZINE) 10 MG tablet Take 1 tablet (10 mg total) by mouth every 6 (six) hours as needed.  30 tablet  1  . rivaroxaban (XARELTO) 20 MG TABS tablet Take 1 tablet (20 mg total) by mouth daily with supper.  90 tablet  0  . zolendronic acid (ZOMETA) 4 MG/5ML injection Inject 4 mg into the vein every 28 (twenty-eight) days.      . ciprofloxacin (CIPRO) 500 MG tablet Take 1 tablet (500 mg total) by mouth 2 (two) times daily.  14 tablet  0  . oxycodone (OXY-IR) 5 MG capsule Take 5 mg by mouth every 6 (six) hours as needed for pain.       Facility-Administered Medications Prior to Visit  Medication Dose Route Frequency Provider  Last Rate Last Dose  . ondansetron (ZOFRAN) tablet 8 mg  8 mg Oral Once Curt Bears, MD      . sodium chloride 0.9 % injection 10 mL  10 mL Intracatheter PRN Curt Bears, MD   10 mL at 06/11/13 1616   Allergies  Allergen Reactions  . Penicillins     REACTION: rash   Patient Active Problem List   Diagnosis Date Noted  . Acute pulmonary embolism 02/07/2014  . Acute deep vein thrombosis (DVT) of tibial vein of right lower extremity 02/07/2014  . DVT of leg (deep venous thrombosis) 02/06/2014  . Multiple myeloma 01/27/2012  . Hyponatremia 01/16/2012  . Anemia 01/16/2012  . Dyspnea 04/18/2011  . Preop cardiovascular exam 04/18/2011  . ABDOMINAL PAIN-RUQ 04/29/2010  . IRRITABLE BOWEL SYNDROME 02/19/2009  . GERD 01/03/2008  . PERSONAL HX COLONIC POLYPS 01/03/2008   History  Substance Use Topics  . Smoking status: Former Smoker -- 3.00 packs/day for 10 years    Types: Cigarettes    Quit date: 07/11/1976  . Smokeless tobacco: Never Used     Comment: 3 PPD x 6 years  . Alcohol Use: 1.5 oz/week    3 drink(s) per week     Comment: 2 glasses of wine daily   Allergies  Allergen Reactions  . Penicillins     REACTION: rash       Objective:   Physical Exam  Well-developed older white male in no acute distress, quite pleasant blood pressure 106/60 pulse 72 height 5 foot 9 weight 211. HEENT;nontraumatic, normocephalic EOMI PERRLA sclera anicteric, Supple; no JVD, Cardiovascular; regular rate and rhythm with S1-S2 no murmur or gallop, Pulmonary; clear bilaterally, Abdomen; soft bowel sounds are present, somewhat hyperactive is mild tenderness across the upper abdomen no guarding or rebound no palpable mass or hepatosplenomegaly also has mild tenderness in the left lower quadrant, Rectal; exam not done, Extremities ;no clubbing cyanosis or edema skin warm and dry, Psych; mood and affect appropriate       Assessment & Plan:  #11  73 year old male with 5 week history of abdominal  cramping nausea and loose stools associated with bloating and gas. Etiology not clear he has had previous diagnosis of IBS, rule out bacterial overgrowth rule out infectious etiology i.e. giardiasis. #2 diverticulosis #3 adenomatous and hyperplastic polyps due for followup colonoscopy #4 multiple myeloma -status post stem cell transplant and now maintained on Revlimid #5 recent DVT and pulmonary embolus July 2015 on the relative #6Status post appendectomy #7Chronic GERD  Plan; CBC with differential, CMET-patient states he had negative urinalysis and stool cultures at PCP office Start Zofran 4 mg every 6 hours  when necessary for nausea Bentyl 10 mg Q6 to 8 hours as needed for cramping and diarrhea Empiric course of Flagyl 250 mg 4  times daily with meals x10 days Continue align one daily Schedule for CT scan of the abdomen and pelvis next week when he returns. Patient should have followup colonoscopy however we'll need to wait at least 6 months post pulmonary embolus before considering interruption of anticoagulation

## 2014-04-22 NOTE — Patient Instructions (Signed)
Please go to the basement level to have your labs drawn.  We sent prescription for [facility]. 1. Zofran 4 mg 2. Bentyl ( Dicyclomine) 10 mg 3. Flagyl ( Metronidazole) 250 mg.   You have been scheduled for a CT scan of the abdomen and pelvis at [facility] ([address]---this is in the same building as Press photographer).   You are scheduled on Monday 04-28-2014 at 10:00 AM . You should arrive  AT 9:45 AM  prior to your appointment time for registration. Please follow the written instructions below on the day of your exam:  WARNING: IF YOU ARE ALLERGIC TO IODINE/X-RAY DYE, PLEASE NOTIFY RADIOLOGY IMMEDIATELY AT [phone]! YOU WILL BE GIVEN A 13 HOUR PREMEDICATION PREP.  1) Do not eat or drink anything after 6 Hours (4 hours prior to your test) 2) You have been given 2 bottles of oral contrast to drink. The solution may taste  better if refrigerated, but do NOT add ice or any other liquid to this solution. Shake well before drinking.    Drink 1 bottle of contrast @  8:00 am  (2 hours prior to your exam)  Drink 1 bottle of contrast @ 9:00 am  (1 hour prior to your exam)  You may take any medications as prescribed with a small amount of water except for the following: Metformin, Glucophage, Glucovance, Avandamet, Riomet, Fortamet, Actoplus Met, Janumet, Glumetza or Metaglip. The above medications must be held the day of the exam AND 48 hours after the exam.  The purpose of you drinking the oral contrast is to aid in the visualization of your intestinal tract. The contrast solution may cause some diarrhea. Before your exam is started, you will be given a small amount of fluid to drink. Depending on your individual set of symptoms, you may also receive an intravenous injection of x-ray contrast/dye. Plan on being at [facility] for 30 minutes or long, depending on the type of exam you are having performed.  If you have any questions regarding your exam or  if you need to reschedule, you may call the CT department at [phone] between the hours of 8:00 am and 5:00 pm, Monday-Friday.  ________________________________________________________________________

## 2014-04-24 ENCOUNTER — Encounter: Payer: Self-pay | Admitting: Physician Assistant

## 2014-04-25 ENCOUNTER — Encounter: Payer: Self-pay | Admitting: Family Medicine

## 2014-04-28 ENCOUNTER — Ambulatory Visit (INDEPENDENT_AMBULATORY_CARE_PROVIDER_SITE_OTHER)
Admission: RE | Admit: 2014-04-28 | Discharge: 2014-04-28 | Disposition: A | Discharge status: Home / Self Care | Payer: Medicare Other | Source: Ambulatory Visit | Attending: Physician Assistant | Admitting: Physician Assistant

## 2014-04-28 ENCOUNTER — Encounter: Payer: Self-pay | Admitting: Family Medicine

## 2014-04-28 DIAGNOSIS — R1084 Generalized abdominal pain: Secondary | ICD-10-CM

## 2014-04-28 DIAGNOSIS — R110 Nausea: Secondary | ICD-10-CM

## 2014-04-28 DIAGNOSIS — C9000 Multiple myeloma not having achieved remission: Secondary | ICD-10-CM

## 2014-04-28 DIAGNOSIS — R197 Diarrhea, unspecified: Secondary | ICD-10-CM

## 2014-04-28 DIAGNOSIS — R140 Abdominal distension (gaseous): Secondary | ICD-10-CM

## 2014-04-28 DIAGNOSIS — I700 Atherosclerosis of aorta: Secondary | ICD-10-CM | POA: Insufficient documentation

## 2014-04-28 MED ORDER — IOHEXOL 300 MG/ML  SOLN
100.0000 mL | Freq: Once | INTRAMUSCULAR | Status: AC | PRN
  Administered 2014-04-28: 100 mL via INTRAVENOUS

## 2014-04-30 ENCOUNTER — Telehealth: Payer: Self-pay | Admitting: *Deleted

## 2014-04-30 NOTE — Telephone Encounter (Signed)
Called and spoke to pt asking if he needs a refill on his revlimid.  He states he has about 4 weeks left.  He will call us at the beginning on November to let us know when he needs a refill sent to his pharmacy.

## 2014-05-05 ENCOUNTER — Other Ambulatory Visit: Payer: Self-pay | Admitting: Medical Oncology

## 2014-05-05 ENCOUNTER — Other Ambulatory Visit: Payer: Self-pay | Admitting: *Deleted

## 2014-05-05 DIAGNOSIS — C9000 Multiple myeloma not having achieved remission: Secondary | ICD-10-CM

## 2014-05-05 MED ORDER — LENALIDOMIDE 15 MG PO CAPS: 15.0000 mg | ORAL_CAPSULE | Freq: Every day | ORAL | Status: DC

## 2014-05-05 NOTE — Telephone Encounter (Signed)
THIS REFILL REQUEST FOR REVLIMID WAS GIVEN TO DR.[name]'S NURSE, DIANE BELL,RN.

## 2014-05-05 NOTE — Progress Notes (Signed)
revlimid refill escribed to [facility]

## 2014-05-12 ENCOUNTER — Other Ambulatory Visit: Payer: Self-pay | Admitting: Internal Medicine

## 2014-05-12 DIAGNOSIS — C9000 Multiple myeloma not having achieved remission: Secondary | ICD-10-CM

## 2014-05-13 ENCOUNTER — Encounter: Payer: Self-pay | Admitting: Physician Assistant

## 2014-05-13 ENCOUNTER — Other Ambulatory Visit: Payer: Self-pay

## 2014-05-13 MED ORDER — PANTOPRAZOLE SODIUM 40 MG PO TBEC: 40.0000 mg | DELAYED_RELEASE_TABLET | Freq: Two times a day (BID) | ORAL | Status: DC

## 2014-05-22 ENCOUNTER — Other Ambulatory Visit (HOSPITAL_BASED_OUTPATIENT_CLINIC_OR_DEPARTMENT_OTHER): Payer: Medicare Other

## 2014-05-22 ENCOUNTER — Encounter: Payer: Self-pay | Admitting: Internal Medicine

## 2014-05-22 ENCOUNTER — Ambulatory Visit (HOSPITAL_BASED_OUTPATIENT_CLINIC_OR_DEPARTMENT_OTHER): Payer: Medicare Other | Admitting: Internal Medicine

## 2014-05-22 ENCOUNTER — Telehealth: Payer: Self-pay | Admitting: Internal Medicine

## 2014-05-22 VITALS — BP 118/65 | HR 61 | Temp 98.1°F | Resp 18 | Ht 70.0 in | Wt 217.2 lb

## 2014-05-22 DIAGNOSIS — C9000 Multiple myeloma not having achieved remission: Secondary | ICD-10-CM

## 2014-05-22 LAB — COMPREHENSIVE METABOLIC PANEL (CC13)
ALT: 23 U/L (ref 0–55)
AST: 23 U/L (ref 5–34)
Albumin: 3.5 g/dL (ref 3.5–5.0)
Alkaline Phosphatase: 45 U/L (ref 40–150)
Anion Gap: 5 meq/L (ref 3–11)
BUN: 10.4 mg/dL (ref 7.0–26.0)
CO2: 27 meq/L (ref 22–29)
Calcium: 9.3 mg/dL (ref 8.4–10.4)
Chloride: 107 meq/L (ref 98–109)
Creatinine: 1 mg/dL (ref 0.7–1.3)
Glucose: 114 mg/dL (ref 70–140)
Potassium: 3.7 meq/L (ref 3.5–5.1)
Sodium: 139 meq/L (ref 136–145)
Total Bilirubin: 0.9 mg/dL (ref 0.20–1.20)
Total Protein: 6.4 g/dL (ref 6.4–8.3)

## 2014-05-22 LAB — CBC WITH DIFFERENTIAL/PLATELET
BASO%: 8 % — ABNORMAL HIGH (ref 0.0–2.0)
Basophils Absolute: 0.2 10e3/uL — ABNORMAL HIGH (ref 0.0–0.1)
EOS%: 10.3 % — ABNORMAL HIGH (ref 0.0–7.0)
Eosinophils Absolute: 0.3 10e3/uL (ref 0.0–0.5)
HCT: 40.8 % (ref 38.4–49.9)
HGB: 13.6 g/dL (ref 13.0–17.1)
LYMPH%: 28.5 % (ref 14.0–49.0)
MCH: 30.8 pg (ref 27.2–33.4)
MCHC: 33.3 g/dL (ref 32.0–36.0)
MCV: 92.3 fL (ref 79.3–98.0)
MONO#: 0.3 10e3/uL (ref 0.1–0.9)
MONO%: 12.2 % (ref 0.0–14.0)
NEUT#: 1.1 10e3/uL — ABNORMAL LOW (ref 1.5–6.5)
NEUT%: 41 % (ref 39.0–75.0)
Platelets: 142 10e3/uL (ref 140–400)
RBC: 4.42 10e6/uL (ref 4.20–5.82)
RDW: 15.3 % — ABNORMAL HIGH (ref 11.0–14.6)
WBC: 2.6 10e3/uL — ABNORMAL LOW (ref 4.0–10.3)
lymph#: 0.8 10e3/uL — ABNORMAL LOW (ref 0.9–3.3)

## 2014-05-22 LAB — TECHNOLOGIST REVIEW

## 2014-05-22 LAB — LACTATE DEHYDROGENASE (CC13): LDH: 128 U/L (ref 125–245)

## 2014-05-22 NOTE — Progress Notes (Signed)
[facility] [phone]   [phone] [place], [place] [address]  DIAGNOSIS: Stage IIA multiple myeloma diagnosed in July of 2013.   PRIOR THERAPY:  1) Systemic chemotherapy with subcutaneous Velcade 1.3 mg/M2 on days 1, 4, 8 and 11 every 3 weeks in addition to Revlimid 25 mg by mouth daily for 14 days every 3 weeks and dexamethasone 40 mg on a weekly basis. Status post 4 planned cycles.  2) Systemic chemotherapy with subcutaneous Velcade 1.3 mg/M2 on days 1, 4, 8 and 11 every 3 weeks in addition to Revlimid 25 mg by mouth daily for 14 days every 3 weeks and dexamethasone 40 mg on a weekly basis. 4 more cycles are planned and he is status post 4 cycles of the second set of 4 cycles, now status post a total of 10 cycles. 3) Systemic chemotherapy with Carfilzomib 36 mg/M2 on days 1, 2, 8, 9, 15 and 16 in addition to cyclophosphamide 300 mg/M2 IV on days 1, 8 and 15 as well as dexamethasone on days 1, 8, 15 and 22 every 4 weeks. Status post 4 cycles. 4) Zometa 4 mg IV every month. 5) autologous peripheral blood stem cell transplant on 03/20/2013 at [facility] [place]. 6) Consolidation chemotherapy with Carfilzomib 36 mg/M2 on days 1, 2, 8, 9, 15 and 16 every 4 weeks in addition to cyclophosphamide 300 mg/M2 on days 1, 8 and 15 as well as dexamethasone 40 mg on a weekly basis every 4 weeks. Status post 2 cycles. First cycle 06/11/2013. 7) Maintenance therapy with Revlimid 10 mg by mouth daily status post 3 months of treatment.   CURRENT THERAPY:  1) Maintenance therapy with Revlimid 15 mg by mouth daily started 11/09/2013. 2) Zometa 4 mg IV every 2 months. 3) Xarelto 20 mg by mouth daily for pulmonary embolism,.   INTERVAL HISTORY: [name] 73 y.o. male returns to the clinic today for followup visit. He is feeling fine today with no specific complaints. He is tolerating his treatment with  Revlimid fairly well with no significant adverse effects. He denied having any significant fatigue or weakness. He denied having any fever or chills. He has no nausea or vomiting.  He denied having any significant chest pain, shortness of breath, cough or hemoptysis. He has repeat CBC, comprehensive metabolic panel and LDH performed earlier today and he is here for evaluation and discussion of his lab results.  MEDICAL HISTORY: Past Medical History  Diagnosis Date  . GERD (gastroesophageal reflux disease)   . Adenomatous polyp of colon 1996  . Arthritis     Osteoarthritis right knee, spine, wrist  . Benign prostatic hypertrophy     (Dr. Karsten Ro)  . History of chronic prostatitis   . History of melanoma     Back (Dr. Wilhemina Bonito); early melanoma R arm 03/2011  . Diverticulosis   . Arthritis of hand, right     Right thumb (Dr. Daylene Katayama)  . Degenerative disc disease   . Hemorrhoids   . Erosive esophagitis   . Back ache     r/o Multiple Myeloma  . Multiple myeloma 2013  . DVT (deep venous thrombosis) 01/2014    R calf  . Pulmonary embolism 01/2014    ALLERGIES:  is allergic to penicillins.  MEDICATIONS:  Current Outpatient Prescriptions  Medication Sig Dispense Refill  . acetaminophen (TYLENOL) 325 MG tablet Take 650 mg by mouth. prn    .  ascorbic acid (VITAMIN C) 500 MG tablet Take 500 mg by mouth daily.    . B Complex-C (B-COMPLEX WITH VITAMIN C) tablet Take 1 tablet by mouth daily.    . cholecalciferol (VITAMIN D) 1000 UNITS tablet Take 1,000 Units by mouth daily.    Marland Kitchen dicyclomine (BENTYL) 10 MG capsule Take 1 tab every 6 hours as needed for cramping, bloating, diarrhea. 90 capsule 1  . fish oil-omega-3 fatty acids 1000 MG capsule Take 1 g by mouth daily.     Marland Kitchen HYDROcodone-acetaminophen (NORCO) 7.5-325 MG per tablet Take 1 tablet by mouth every 6 (six) hours as needed for moderate pain. 30 tablet 0  . lenalidomide (REVLIMID) 15 MG capsule Take 1 capsule (15 mg total) by mouth daily.  28 capsule 0  . methocarbamol (ROBAXIN) 500 MG tablet Take 1-2 tablets (500-1,000 mg total) by mouth every 6 (six) hours as needed for muscle spasms. 60 tablet 0  . Multiple Vitamin (MULTIVITAMIN WITH MINERALS) TABS Take 1 tablet by mouth daily.    . ondansetron (ZOFRAN) 4 MG tablet Take 1 tab every 6 hours as needed for nausea 40 tablet 1  . ondansetron (ZOFRAN-ODT) 4 MG disintegrating tablet Take 4 mg by mouth every 12 (twelve) hours as needed for nausea.    . pantoprazole (PROTONIX) 40 MG tablet Take 1 tablet (40 mg total) by mouth 2 (two) times daily. Take 1 tablet (40 mg total) by mouth Two (2) times a day. 60 tablet 5  . Probiotic Product (ALIGN) 4 MG CAPS Take 1 capsule by mouth daily.    . prochlorperazine (COMPAZINE) 10 MG tablet Take 1 tablet (10 mg total) by mouth every 6 (six) hours as needed. 30 tablet 1  . XARELTO 20 MG TABS tablet TAKE 1 TABLET BY MOUTH DAILY WITH SUPPER (Patient taking differently: TAKE 1 TABLET BY MOUTH DAILY) 90 tablet 0  . zolendronic acid (ZOMETA) 4 MG/5ML injection Inject 4 mg into the vein every 28 (twenty-eight) days.     No current facility-administered medications for this visit.   Facility-Administered Medications Ordered in Other Visits  Medication Dose Route Frequency Provider Last Rate Last Dose  . ondansetron (ZOFRAN) tablet 8 mg  8 mg Oral Once Curt Bears, MD      . sodium chloride 0.9 % injection 10 mL  10 mL Intracatheter PRN Curt Bears, MD   10 mL at 06/11/13 1616    SURGICAL HISTORY:  Past Surgical History  Procedure Laterality Date  . Total knee replaced  2000    Left knee  . Appendectomy    . Colonoscopy  2009    adenomatous polyp  . Esophagogastroduodenoscopy  2009    mild inflammation at esophagogastric junction  . Excision of melanoma      back ( x3)  . Great toe surgery      bilateral  . Inguinal hernia repair      left, x 2  . Total knee arthroplasty  06/08/2011    Procedure: TOTAL KNEE ARTHROPLASTY;  Surgeon:  Ninetta Lights, MD;  Location: [facility];  Service: Orthopedics;  Laterality: Right;  osteonics  . [place]  10/2012  . Bone marrow transplant  03/2013    Stem Cell    REVIEW OF SYSTEMS:  Constitutional: negative Eyes: negative Ears, nose, mouth, throat, and face: negative Respiratory: negative Cardiovascular: negative Gastrointestinal: negative Genitourinary:negative Integument/breast: negative Hematologic/lymphatic: negative Musculoskeletal:negative Neurological: negative Behavioral/Psych: negative Endocrine: negative Allergic/Immunologic: negative   PHYSICAL EXAMINATION: General appearance: alert, cooperative and no  distress Head: Normocephalic, without obvious abnormality, atraumatic Neck: no adenopathy, no JVD, supple, symmetrical, trachea midline and thyroid not enlarged, symmetric, no tenderness/mass/nodules Lymph nodes: Cervical, supraclavicular, and axillary nodes normal. Resp: clear to auscultation bilaterally Back: symmetric, no curvature. ROM normal. No CVA tenderness. Cardio: regular rate and rhythm, S1, S2 normal, no murmur, click, rub or gallop GI: soft, non-tender; bowel sounds normal; no masses,  no organomegaly Extremities: extremities normal, atraumatic, no cyanosis or edema Neurologic: Alert and oriented X 3, normal strength and tone. Normal symmetric reflexes. Normal coordination and gait  ECOG PERFORMANCE STATUS: 1 - Symptomatic but completely ambulatory  Blood pressure 118/65, pulse 61, temperature 98.1 F (36.7 C), temperature source Oral, resp. rate 18, height 5\' 9"  (1.753 m), weight 217 lb 3.2 oz (98.521 kg), SpO2 97 %.  LABORATORY DATA: Lab Results  Component Value Date   WBC 2.6* 05/22/2014   HGB 13.6 05/22/2014   HCT 40.8 05/22/2014   MCV 92.3 05/22/2014   PLT 142 05/22/2014      Chemistry      Component Value Date/Time   NA 139 05/22/2014 0922   NA 139 04/22/2014 1417   K 3.7 05/22/2014 0922   K 3.9 04/22/2014 1417   CL 107  04/22/2014 1417   CL 109* 12/31/2012 0759   CO2 27 05/22/2014 0922   CO2 22 04/22/2014 1417   BUN 10.4 05/22/2014 0922   BUN 10 04/22/2014 1417   CREATININE 1.0 05/22/2014 0922   CREATININE 1.1 04/22/2014 1417   CREATININE 0.97 11/18/2013 1045      Component Value Date/Time   CALCIUM 8.8 04/22/2014 1417   CALCIUM 9.3 04/14/2014 0844   ALKPHOS 47 04/22/2014 1417   ALKPHOS 52 04/14/2014 0844   AST 18 04/22/2014 1417   AST 23 04/14/2014 0844   ALT 22 04/22/2014 1417   ALT 27 04/14/2014 0844   BILITOT 0.8 04/22/2014 1417   BILITOT 0.56 04/14/2014 0844       RADIOGRAPHIC STUDIES: No results found.  ASSESSMENT AND PLAN: This is a very pleasant 73 years old white male with history of multiple myeloma status post induction chemotherapy with Velcade, Revlimid and dexamethasone followed by treatment with Carfilzomib, cyclophosphamide and dexamethasone with significant improvement in his disease followed by autologous peripheral blood stem cell transplant at [facility] on 03/20/2013. This was followed by 2 cycles of consolidation chemotherapy with Carfilzomib, Cytoxan and dexamethasone, and he tolerated it fairly well. He completed maintenance Revlimid 10 mg by mouth daily for the last 3 months and tolerating it fairly well. He started maintenance Revlimid with 15 mg by mouth daily last week and tolerating it well. His leukocyte count and absolute count are low today. I discussed the lab result with the patient today. I recommended for the patient to hold his treatment with Revlimid For 1 week. I will repeat his CBC in 1 week for evaluation of his counts before resuming his treatment with Revlimid. He would come back for followup visit in one month. He will continue treatment with Zometa every 2 months. For the pulmonary embolism, he will continue on Xarelto 20 mg by mouth daily as scheduled. He was advised to call immediately if he has any concerning symptoms in the interval. The  patient voices understanding of current disease status and treatment options and is in agreement with the current care plan.  All questions were answered. The patient knows to call the clinic with any problems, questions or concerns. We can certainly see the patient  much sooner if necessary.  Disclaimer: This note was dictated with voice recognition software. Similar sounding words can inadvertently be transcribed and may not be corrected upon review.

## 2014-05-22 NOTE — Telephone Encounter (Signed)
gv adn printed appt sched and avs fo rpt fjor NOV and DEC.[name]

## 2014-05-28 ENCOUNTER — Other Ambulatory Visit: Payer: Self-pay | Admitting: *Deleted

## 2014-05-28 NOTE — Telephone Encounter (Signed)
THIS REFILL REQUEST FOR REVLIMID WAS GIVEN TO DR.[name]'S NURSE, DIANE BELL,RN.

## 2014-05-29 ENCOUNTER — Other Ambulatory Visit: Payer: Self-pay | Admitting: Medical Oncology

## 2014-05-29 DIAGNOSIS — C9000 Multiple myeloma not having achieved remission: Secondary | ICD-10-CM

## 2014-05-29 MED ORDER — LENALIDOMIDE 15 MG PO CAPS: 15.0000 mg | ORAL_CAPSULE | Freq: Every day | ORAL | Status: DC

## 2014-05-30 ENCOUNTER — Other Ambulatory Visit (HOSPITAL_BASED_OUTPATIENT_CLINIC_OR_DEPARTMENT_OTHER): Payer: Medicare Other

## 2014-05-30 ENCOUNTER — Other Ambulatory Visit: Payer: Self-pay | Admitting: Medical Oncology

## 2014-05-30 DIAGNOSIS — C9000 Multiple myeloma not having achieved remission: Secondary | ICD-10-CM

## 2014-05-30 LAB — COMPREHENSIVE METABOLIC PANEL (CC13)
ALT: 21 U/L (ref 0–55)
AST: 19 U/L (ref 5–34)
Albumin: 3.5 g/dL (ref 3.5–5.0)
Alkaline Phosphatase: 52 U/L (ref 40–150)
Anion Gap: 7 mEq/L (ref 3–11)
BUN: 14.3 mg/dL (ref 7.0–26.0)
CALCIUM: 9.3 mg/dL (ref 8.4–10.4)
CHLORIDE: 107 meq/L (ref 98–109)
CO2: 26 meq/L (ref 22–29)
Creatinine: 0.9 mg/dL (ref 0.7–1.3)
Glucose: 93 mg/dl (ref 70–140)
POTASSIUM: 3.9 meq/L (ref 3.5–5.1)
SODIUM: 140 meq/L (ref 136–145)
TOTAL PROTEIN: 6.4 g/dL (ref 6.4–8.3)
Total Bilirubin: 0.88 mg/dL (ref 0.20–1.20)

## 2014-05-30 LAB — CBC WITH DIFFERENTIAL/PLATELET
BASO%: 5.6 % — ABNORMAL HIGH (ref 0.0–2.0)
BASOS ABS: 0.2 10*3/uL — AB (ref 0.0–0.1)
EOS ABS: 0.3 10*3/uL (ref 0.0–0.5)
EOS%: 6.1 % (ref 0.0–7.0)
HCT: 39 % (ref 38.4–49.9)
HEMOGLOBIN: 13.3 g/dL (ref 13.0–17.1)
LYMPH#: 0.8 10*3/uL — AB (ref 0.9–3.3)
LYMPH%: 19.5 % (ref 14.0–49.0)
MCH: 31.3 pg (ref 27.2–33.4)
MCHC: 34.1 g/dL (ref 32.0–36.0)
MCV: 91.8 fL (ref 79.3–98.0)
MONO#: 0.5 10*3/uL (ref 0.1–0.9)
MONO%: 11 % (ref 0.0–14.0)
NEUT#: 2.4 10*3/uL (ref 1.5–6.5)
NEUT%: 57.8 % (ref 39.0–75.0)
NRBC: 0 % (ref 0–0)
Platelets: 161 10*3/uL (ref 140–400)
RBC: 4.25 10*6/uL (ref 4.20–5.82)
RDW: 15.4 % — AB (ref 11.0–14.6)
WBC: 4.1 10*3/uL (ref 4.0–10.3)

## 2014-05-30 LAB — LACTATE DEHYDROGENASE (CC13): LDH: 137 U/L (ref 125–245)

## 2014-06-02 ENCOUNTER — Telehealth: Payer: Self-pay | Admitting: *Deleted

## 2014-06-02 NOTE — Telephone Encounter (Signed)
CBC from 11/20 reviewed with Dr Vista Mink, per Dr Vista Mink ok to resume revlimid, pt verbalized understanding.

## 2014-06-04 ENCOUNTER — Encounter: Payer: Self-pay | Admitting: Family Medicine

## 2014-06-04 ENCOUNTER — Ambulatory Visit (INDEPENDENT_AMBULATORY_CARE_PROVIDER_SITE_OTHER): Payer: Medicare Other | Admitting: Family Medicine

## 2014-06-04 VITALS — BP 104/64 | HR 64 | Temp 97.2°F | Ht 69.0 in | Wt 208.0 lb

## 2014-06-04 DIAGNOSIS — Z23 Encounter for immunization: Secondary | ICD-10-CM

## 2014-06-04 DIAGNOSIS — J069 Acute upper respiratory infection, unspecified: Secondary | ICD-10-CM

## 2014-06-04 MED ORDER — AZITHROMYCIN 250 MG PO TABS: ORAL_TABLET | ORAL | Status: DC

## 2014-06-04 NOTE — Patient Instructions (Signed)
Continue to drink plenty of fluids. Continue Mucinex and coricidin to help with congestion and keep the mucus thin

## 2014-06-04 NOTE — Progress Notes (Signed)
Chief Complaint  Patient presents with  . Cough    x 5 days. Mucus is yellow in color. Low grade temp. No energy. B/L ear pain. Did mention that he just got back from [place]. Needs 2nd Pediarix today.     Patient presents to get 2nd round of vaccinations today (brings in paperwork--needs Pediarix, Prevnar-13 and HIB).  He has been sick for 10 days.  During that time he traveled to [facility].  He is complaining of bilateral ear pain, cough, runny nose, congestion.  Nasal mucus is yellow; phlegm is also yellow. Denies any shortness of breath.  He is having bitemporal headaches, no sinus pain.  He has tried mucinex, throat lozenges, coricidin and nothing helps.  He states symptoms are not improving.  He has had intermittent fever and chills.  He saw Dr. Earlie Server prior to trip, and WBC was low so Revlimid was held.  Restarted after repeat counts were improved, and he is back on the medication.  PMH, PSH, SH and medications were reviewed.  Allergies  Allergen Reactions  . Penicillins     REACTION: rash   ROS: Denies nausea, vomiting.  He has had off/on diarrhea over the last week. Denies any bleeding. He has bruising from being on xarelto. No dizziness, chest pain, leg swelling, shortness of breath or other concerns.  PHYSICAL EXAM: BP 104/64 mmHg  Pulse 64  Temp(Src) 97.2 F (36.2 C) (Tympanic)  Ht 5\' 9"  (1.753 m)  Wt 208 lb (94.348 kg)  BMI 30.70 kg/m2 Well appearing, pleasant male in no distress HEENT:  PERRL, EOMI, conjunctiva clear.  TM's and EAC's normal.  Nasal mucosa is mildly edematous, no purulence.  OP is clear Sinuses nontender Neck: no lymphadenopathy or mass Heart: regular rate and rhythm Lungs: clear bilaterally Skin: no rashes Psych: normal mood, affect, hygiene and grooming Neuro: alert and oriented.  Cranial nerves intact. Normal strength, sensation, gait.   ASSESSMENT/PLAN:  Acute upper respiratory infection - Plan: azithromycin (ZITHROMAX) 250 MG  tablet  Immunization due  Need for pneumococcal vaccine - Plan: Pneumococcal conjugate vaccine 13-valent  Need for DTaP and Hib vaccine - Plan: HiB PRP-OMP conjugate vaccine 3 dose IM, DTaP HepB IPV combined vaccine IM  Need for DTaP, hepatitis B, and IPV vaccination - Plan: HiB PRP-OMP conjugate vaccine 3 dose IM   Given immunosuppression and ongoing illness for 10 days without improvement, will treat his URI symptoms with Z-pak  F/u 2 mos for next round of vaccines

## 2014-06-18 ENCOUNTER — Ambulatory Visit (HOSPITAL_BASED_OUTPATIENT_CLINIC_OR_DEPARTMENT_OTHER): Payer: Medicare Other

## 2014-06-18 ENCOUNTER — Other Ambulatory Visit (HOSPITAL_BASED_OUTPATIENT_CLINIC_OR_DEPARTMENT_OTHER): Payer: Medicare Other

## 2014-06-18 ENCOUNTER — Encounter: Payer: Self-pay | Admitting: Internal Medicine

## 2014-06-18 ENCOUNTER — Ambulatory Visit (HOSPITAL_BASED_OUTPATIENT_CLINIC_OR_DEPARTMENT_OTHER): Payer: Medicare Other | Admitting: Internal Medicine

## 2014-06-18 ENCOUNTER — Telehealth: Payer: Self-pay | Admitting: Internal Medicine

## 2014-06-18 VITALS — BP 126/69 | HR 67 | Temp 98.5°F | Resp 18 | Ht 69.0 in | Wt 213.9 lb

## 2014-06-18 DIAGNOSIS — C9000 Multiple myeloma not having achieved remission: Secondary | ICD-10-CM

## 2014-06-18 DIAGNOSIS — Z452 Encounter for adjustment and management of vascular access device: Secondary | ICD-10-CM

## 2014-06-18 DIAGNOSIS — I2699 Other pulmonary embolism without acute cor pulmonale: Secondary | ICD-10-CM

## 2014-06-18 LAB — CBC WITH DIFFERENTIAL/PLATELET
BASO%: 1.9 % (ref 0.0–2.0)
Basophils Absolute: 0.1 10*3/uL (ref 0.0–0.1)
EOS%: 9.7 % — AB (ref 0.0–7.0)
Eosinophils Absolute: 0.4 10*3/uL (ref 0.0–0.5)
HCT: 40.2 % (ref 38.4–49.9)
HGB: 13.1 g/dL (ref 13.0–17.1)
LYMPH%: 22.6 % (ref 14.0–49.0)
MCH: 30.5 pg (ref 27.2–33.4)
MCHC: 32.6 g/dL (ref 32.0–36.0)
MCV: 93.6 fL (ref 79.3–98.0)
MONO#: 0.5 10*3/uL (ref 0.1–0.9)
MONO%: 11.4 % (ref 0.0–14.0)
NEUT#: 2.2 10*3/uL (ref 1.5–6.5)
NEUT%: 54.4 % (ref 39.0–75.0)
Platelets: 161 10*3/uL (ref 140–400)
RBC: 4.3 10*6/uL (ref 4.20–5.82)
RDW: 16 % — ABNORMAL HIGH (ref 11.0–14.6)
WBC: 4 10*3/uL (ref 4.0–10.3)
lymph#: 0.9 10*3/uL (ref 0.9–3.3)

## 2014-06-18 LAB — COMPREHENSIVE METABOLIC PANEL (CC13)
ALT: 22 U/L (ref 0–55)
ANION GAP: 10 meq/L (ref 3–11)
AST: 18 U/L (ref 5–34)
Albumin: 3.5 g/dL (ref 3.5–5.0)
Alkaline Phosphatase: 57 U/L (ref 40–150)
BILIRUBIN TOTAL: 0.61 mg/dL (ref 0.20–1.20)
BUN: 12.4 mg/dL (ref 7.0–26.0)
CALCIUM: 9.1 mg/dL (ref 8.4–10.4)
CHLORIDE: 106 meq/L (ref 98–109)
CO2: 24 meq/L (ref 22–29)
CREATININE: 1 mg/dL (ref 0.7–1.3)
EGFR: 75 mL/min/{1.73_m2} — ABNORMAL LOW (ref >90)
Glucose: 90 mg/dl (ref 70–140)
Potassium: 4.1 mEq/L (ref 3.5–5.1)
SODIUM: 140 meq/L (ref 136–145)
TOTAL PROTEIN: 6.4 g/dL (ref 6.4–8.3)

## 2014-06-18 LAB — LACTATE DEHYDROGENASE (CC13): LDH: 124 U/L — ABNORMAL LOW (ref 125–245)

## 2014-06-18 MED ORDER — ALTEPLASE 2 MG IJ SOLR
2.0000 mg | Freq: Once | INTRAMUSCULAR | Status: DC | PRN
  Filled 2014-06-18: qty 2

## 2014-06-18 MED ORDER — ZOLEDRONIC ACID 4 MG/100ML IV SOLN
4.0000 mg | Freq: Once | INTRAVENOUS | Status: AC
  Administered 2014-06-18: 4 mg via INTRAVENOUS
  Filled 2014-06-18: qty 100

## 2014-06-18 MED ORDER — HEPARIN SOD (PORK) LOCK FLUSH 100 UNIT/ML IV SOLN
250.0000 [IU] | Freq: Once | INTRAVENOUS | Status: AC | PRN
  Administered 2014-06-18: 10:00:00
  Filled 2014-06-18: qty 5

## 2014-06-18 MED ORDER — SODIUM CHLORIDE 0.9 % IJ SOLN
3.0000 mL | Freq: Once | INTRAMUSCULAR | Status: AC | PRN
  Administered 2014-06-18: 10:00:00 via INTRAVENOUS
  Filled 2014-06-18: qty 10

## 2014-06-18 NOTE — Progress Notes (Signed)
[facility] [phone]   Fax:(336) [place], MD [place] [address]  DIAGNOSIS: Stage IIA multiple myeloma diagnosed in July of 2013.   PRIOR THERAPY:  1) Systemic chemotherapy with subcutaneous Velcade 1.3 mg/M2 on days 1, 4, 8 and 11 every 3 weeks in addition to Revlimid 25 mg by mouth daily for 14 days every 3 weeks and dexamethasone 40 mg on a weekly basis. Status post 4 planned cycles.  2) Systemic chemotherapy with subcutaneous Velcade 1.3 mg/M2 on days 1, 4, 8 and 11 every 3 weeks in addition to Revlimid 25 mg by mouth daily for 14 days every 3 weeks and dexamethasone 40 mg on a weekly basis. 4 more cycles are planned and he is status post 4 cycles of the second set of 4 cycles, now status post a total of 10 cycles. 3) Systemic chemotherapy with Carfilzomib 36 mg/M2 on days 1, 2, 8, 9, 15 and 16 in addition to cyclophosphamide 300 mg/M2 IV on days 1, 8 and 15 as well as dexamethasone on days 1, 8, 15 and 22 every 4 weeks. Status post 4 cycles. 4) Zometa 4 mg IV every month. 5) autologous peripheral blood stem cell transplant on 03/20/2013 at [facility]. 6) Consolidation chemotherapy with Carfilzomib 36 mg/M2 on days 1, 2, 8, 9, 15 and 16 every 4 weeks in addition to cyclophosphamide 300 mg/M2 on days 1, 8 and 15 as well as dexamethasone 40 mg on a weekly basis every 4 weeks. Status post 2 cycles. First cycle 06/11/2013. 7) Maintenance therapy with Revlimid 10 mg by mouth daily status post 3 months of treatment.   CURRENT THERAPY:  1) Maintenance therapy with Revlimid 15 mg by mouth daily started 11/09/2013. 2) Zometa 4 mg IV every 2 months. 3) Xarelto 20 mg by mouth daily for pulmonary embolism,.   INTERVAL HISTORY: [name] 73 y.o. male returns to the clinic today for followup visit. He is feeling fine today with no specific complaints except for mild right shoulder pain. He  related to her sleeping position. He is tolerating his treatment with Revlimid fairly well with no significant adverse effects. He denied having any significant fatigue or weakness. He denied having any fever or chills. He has no nausea or vomiting.  He denied having any significant chest pain, shortness of breath, cough or hemoptysis. He has repeat CBC, comprehensive metabolic panel and LDH performed earlier today and he is here for evaluation and discussion of his lab results.  MEDICAL HISTORY: Past Medical History  Diagnosis Date  . GERD (gastroesophageal reflux disease)   . Adenomatous polyp of colon 1996  . Arthritis     Osteoarthritis right knee, spine, wrist  . Benign prostatic hypertrophy     (Dr. Karsten Ro)  . History of chronic prostatitis   . History of melanoma     Back (Dr. Wilhemina Bonito); early melanoma R arm 03/2011  . Diverticulosis   . Arthritis of hand, right     Right thumb (Dr. Daylene Katayama)  . Degenerative disc disease   . Hemorrhoids   . Erosive esophagitis   . Back ache     r/o Multiple Myeloma  . Multiple myeloma 2013  . DVT (deep venous thrombosis) 01/2014    R calf  . Pulmonary embolism 01/2014    ALLERGIES:  is allergic to penicillins.  MEDICATIONS:  Current Outpatient Prescriptions  Medication Sig Dispense Refill  .  acetaminophen (TYLENOL) 325 MG tablet Take 650 mg by mouth. prn    . ascorbic acid (VITAMIN C) 500 MG tablet Take 500 mg by mouth daily.    . B Complex-C (B-COMPLEX WITH VITAMIN C) tablet Take 1 tablet by mouth daily.    . cholecalciferol (VITAMIN D) 1000 UNITS tablet Take 1,000 Units by mouth daily.    Marland Kitchen dicyclomine (BENTYL) 10 MG capsule Take 1 tab every 6 hours as needed for cramping, bloating, diarrhea. (Patient not taking: Reported on 06/04/2014) 90 capsule 1  . fish oil-omega-3 fatty acids 1000 MG capsule Take 1 g by mouth daily.     Marland Kitchen HYDROcodone-acetaminophen (NORCO) 7.5-325 MG per tablet Take 1 tablet by mouth every 6 (six) hours as needed for  moderate pain. (Patient not taking: Reported on 06/04/2014) 30 tablet 0  . lenalidomide (REVLIMID) 15 MG capsule Take 1 capsule (15 mg total) by mouth daily. 28 capsule 0  . methocarbamol (ROBAXIN) 500 MG tablet Take 1-2 tablets (500-1,000 mg total) by mouth every 6 (six) hours as needed for muscle spasms. (Patient not taking: Reported on 06/04/2014) 60 tablet 0  . Multiple Vitamin (MULTIVITAMIN WITH MINERALS) TABS Take 1 tablet by mouth daily.    . ondansetron (ZOFRAN) 4 MG tablet Take 1 tab every 6 hours as needed for nausea (Patient not taking: Reported on 06/04/2014) 40 tablet 1  . pantoprazole (PROTONIX) 40 MG tablet Take 1 tablet (40 mg total) by mouth 2 (two) times daily. Take 1 tablet (40 mg total) by mouth Two (2) times a day. 60 tablet 5  . Probiotic Product (ALIGN) 4 MG CAPS Take 1 capsule by mouth daily.    . prochlorperazine (COMPAZINE) 10 MG tablet Take 1 tablet (10 mg total) by mouth every 6 (six) hours as needed. (Patient not taking: Reported on 06/04/2014) 30 tablet 1  . temazepam (RESTORIL) 15 MG capsule Take 15 mg by mouth at bedtime as needed.    Alveda Reasons 20 MG TABS tablet TAKE 1 TABLET BY MOUTH DAILY WITH SUPPER (Patient taking differently: TAKE 1 TABLET BY MOUTH DAILY) 90 tablet 0  . zolendronic acid (ZOMETA) 4 MG/5ML injection Inject 4 mg into the vein every 28 (twenty-eight) days.     No current facility-administered medications for this visit.   Facility-Administered Medications Ordered in Other Visits  Medication Dose Route Frequency Provider Last Rate Last Dose  . ondansetron (ZOFRAN) tablet 8 mg  8 mg Oral Once Curt Bears, MD      . sodium chloride 0.9 % injection 10 mL  10 mL Intracatheter PRN Curt Bears, MD   10 mL at 06/11/13 1616    SURGICAL HISTORY:  Past Surgical History  Procedure Laterality Date  . Total knee replaced  2000    Left knee  . Appendectomy    . Colonoscopy  2009    adenomatous polyp  . Esophagogastroduodenoscopy  2009    mild  inflammation at esophagogastric junction  . Excision of melanoma      back ( x3)  . Great toe surgery      bilateral  . Inguinal hernia repair      left, x 2  . Total knee arthroplasty  06/08/2011    Procedure: TOTAL KNEE ARTHROPLASTY;  Surgeon: Ninetta Lights, MD;  Location: [facility];  Service: Orthopedics;  Laterality: Right;  osteonics  . [place]  10/2012  . Bone marrow transplant  03/2013    Stem Cell    REVIEW OF SYSTEMS:  Constitutional:  negative Eyes: negative Ears, nose, mouth, throat, and face: negative Respiratory: negative Cardiovascular: negative Gastrointestinal: negative Genitourinary:negative Integument/breast: negative Hematologic/lymphatic: negative Musculoskeletal:negative Neurological: negative Behavioral/Psych: negative Endocrine: negative Allergic/Immunologic: negative   PHYSICAL EXAMINATION: General appearance: alert, cooperative and no distress Head: Normocephalic, without obvious abnormality, atraumatic Neck: no adenopathy, no JVD, supple, symmetrical, trachea midline and thyroid not enlarged, symmetric, no tenderness/mass/nodules Lymph nodes: Cervical, supraclavicular, and axillary nodes normal. Resp: clear to auscultation bilaterally Back: symmetric, no curvature. ROM normal. No CVA tenderness. Cardio: regular rate and rhythm, S1, S2 normal, no murmur, click, rub or gallop GI: soft, non-tender; bowel sounds normal; no masses,  no organomegaly Extremities: extremities normal, atraumatic, no cyanosis or edema Neurologic: Alert and oriented X 3, normal strength and tone. Normal symmetric reflexes. Normal coordination and gait  ECOG PERFORMANCE STATUS: 1 - Symptomatic but completely ambulatory  Blood pressure 126/69, pulse 67, temperature 98.5 F (36.9 C), temperature source Oral, resp. rate 18, height $RemoveBe'5\' 9"'ufOVvmBVn$  (1.753 m), weight 213 lb 14.4 oz (97.024 kg), SpO2 98 %.  LABORATORY DATA: Lab Results  Component Value Date   WBC 4.0 06/18/2014   HGB  13.1 06/18/2014   HCT 40.2 06/18/2014   MCV 93.6 06/18/2014   PLT 161 06/18/2014      Chemistry      Component Value Date/Time   NA 140 05/30/2014 1113   NA 139 04/22/2014 1417   K 3.9 05/30/2014 1113   K 3.9 04/22/2014 1417   CL 107 04/22/2014 1417   CL 109* 12/31/2012 0759   CO2 26 05/30/2014 1113   CO2 22 04/22/2014 1417   BUN 14.3 05/30/2014 1113   BUN 10 04/22/2014 1417   CREATININE 0.9 05/30/2014 1113   CREATININE 1.1 04/22/2014 1417   CREATININE 0.97 11/18/2013 1045      Component Value Date/Time   CALCIUM 9.3 05/30/2014 1113   CALCIUM 8.8 04/22/2014 1417   ALKPHOS 52 05/30/2014 1113   ALKPHOS 47 04/22/2014 1417   AST 19 05/30/2014 1113   AST 18 04/22/2014 1417   ALT 21 05/30/2014 1113   ALT 22 04/22/2014 1417   BILITOT 0.88 05/30/2014 1113   BILITOT 0.8 04/22/2014 1417       RADIOGRAPHIC STUDIES: No results found.  ASSESSMENT AND PLAN: This is a very pleasant 73 years old white male with history of multiple myeloma status post induction chemotherapy with Velcade, Revlimid and dexamethasone followed by treatment with Carfilzomib, cyclophosphamide and dexamethasone with significant improvement in his disease followed by autologous peripheral blood stem cell transplant at [facility] on 03/20/2013. This was followed by 2 cycles of consolidation chemotherapy with Carfilzomib, Cytoxan and dexamethasone, and he tolerated it fairly well. He completed maintenance Revlimid 10 mg by mouth daily for 3 months and tolerating it fairly well. He started maintenance Revlimid with 15 mg by mouth daily in May 2015 and tolerating it well. I recommended for the patient to continue his current treatment with Revlimid 15 mg by mouth daily. I will see him back for follow-up visit in one months after repeating myeloma panel for reevaluation of his disease. For the right shoulder pain, we will monitor it for now and if it is getting worse I will order imaging studies of the right  shoulder to evaluate it. He will continue treatment with Zometa every 2 months. For the pulmonary embolism, he will continue on Xarelto 20 mg by mouth daily as scheduled. He was advised to call immediately if he has any concerning symptoms in the interval.  The patient voices understanding of current disease status and treatment options and is in agreement with the current care plan.  All questions were answered. The patient knows to call the clinic with any problems, questions or concerns. We can certainly see the patient much sooner if necessary.  Disclaimer: This note was dictated with voice recognition software. Similar sounding words can inadvertently be transcribed and may not be corrected upon review.

## 2014-06-18 NOTE — Telephone Encounter (Signed)
gv adn pirnted appt sched and avs for pt for Jan and Feb...s.ed added tx.

## 2014-06-18 NOTE — Patient Instructions (Signed)

## 2014-06-23 ENCOUNTER — Other Ambulatory Visit: Payer: Self-pay | Admitting: Dermatology

## 2014-06-24 ENCOUNTER — Other Ambulatory Visit: Payer: Self-pay | Admitting: *Deleted

## 2014-06-24 NOTE — Telephone Encounter (Signed)
THIS REFILL REQUEST FOR REVLIMID WAS GIVEN TO DR.[name]'S NURSE, [place]

## 2014-06-25 ENCOUNTER — Other Ambulatory Visit: Payer: Self-pay | Admitting: *Deleted

## 2014-06-25 ENCOUNTER — Encounter: Payer: Self-pay | Admitting: Family Medicine

## 2014-06-25 DIAGNOSIS — C9000 Multiple myeloma not having achieved remission: Secondary | ICD-10-CM

## 2014-06-25 MED ORDER — LENALIDOMIDE 15 MG PO CAPS: 15.0000 mg | ORAL_CAPSULE | Freq: Every day | ORAL | Status: DC

## 2014-07-09 ENCOUNTER — Telehealth: Payer: Self-pay | Admitting: *Deleted

## 2014-07-09 NOTE — Telephone Encounter (Signed)
Pt called stating that he has a new pain in his left lower skull that is not relieved by massage.  He has been taking hydrocodone but this has not really helped either.  Wants to know if it is r/t his myeloma.  Per Dr Vista Mink, pt can come in and be evaluated and we may need to do an x-ray to r/o whether it is myeloma related.  Pt is aware of appt on 07/14/14.

## 2014-07-14 ENCOUNTER — Telehealth: Payer: Self-pay | Admitting: *Deleted

## 2014-07-14 ENCOUNTER — Other Ambulatory Visit: Payer: Self-pay | Admitting: *Deleted

## 2014-07-14 ENCOUNTER — Telehealth: Payer: Self-pay | Admitting: Internal Medicine

## 2014-07-14 ENCOUNTER — Encounter: Payer: Self-pay | Admitting: Internal Medicine

## 2014-07-14 ENCOUNTER — Ambulatory Visit (HOSPITAL_BASED_OUTPATIENT_CLINIC_OR_DEPARTMENT_OTHER): Payer: Medicare Other | Admitting: Internal Medicine

## 2014-07-14 ENCOUNTER — Ambulatory Visit (HOSPITAL_COMMUNITY)
Admission: RE | Admit: 2014-07-14 | Discharge: 2014-07-14 | Disposition: A | Discharge status: Home / Self Care | Payer: Medicare Other | Source: Ambulatory Visit | Attending: Internal Medicine | Admitting: Internal Medicine

## 2014-07-14 ENCOUNTER — Ambulatory Visit (HOSPITAL_BASED_OUTPATIENT_CLINIC_OR_DEPARTMENT_OTHER): Payer: Medicare Other

## 2014-07-14 ENCOUNTER — Other Ambulatory Visit: Payer: Self-pay | Admitting: Internal Medicine

## 2014-07-14 VITALS — BP 120/72 | HR 85 | Temp 98.0°F | Resp 18 | Ht 69.0 in | Wt 216.4 lb

## 2014-07-14 DIAGNOSIS — C9000 Multiple myeloma not having achieved remission: Secondary | ICD-10-CM | POA: Diagnosis not present

## 2014-07-14 DIAGNOSIS — G4452 New daily persistent headache (NDPH): Secondary | ICD-10-CM

## 2014-07-14 DIAGNOSIS — R51 Headache: Secondary | ICD-10-CM | POA: Diagnosis not present

## 2014-07-14 DIAGNOSIS — R21 Rash and other nonspecific skin eruption: Secondary | ICD-10-CM

## 2014-07-14 DIAGNOSIS — R519 Headache, unspecified: Secondary | ICD-10-CM | POA: Insufficient documentation

## 2014-07-14 DIAGNOSIS — G939 Disorder of brain, unspecified: Secondary | ICD-10-CM | POA: Insufficient documentation

## 2014-07-14 DIAGNOSIS — I2699 Other pulmonary embolism without acute cor pulmonale: Secondary | ICD-10-CM

## 2014-07-14 LAB — COMPREHENSIVE METABOLIC PANEL (CC13)
ALBUMIN: 3.6 g/dL (ref 3.5–5.0)
ALT: 34 U/L (ref 0–55)
ANION GAP: 7 meq/L (ref 3–11)
AST: 28 U/L (ref 5–34)
Alkaline Phosphatase: 47 U/L (ref 40–150)
BILIRUBIN TOTAL: 0.87 mg/dL (ref 0.20–1.20)
BUN: 12.2 mg/dL (ref 7.0–26.0)
CO2: 27 meq/L (ref 22–29)
Calcium: 9.4 mg/dL (ref 8.4–10.4)
Chloride: 108 mEq/L (ref 98–109)
Creatinine: 1 mg/dL (ref 0.7–1.3)
EGFR: 73 mL/min/{1.73_m2} — AB (ref >90)
GLUCOSE: 93 mg/dL (ref 70–140)
POTASSIUM: 3.8 meq/L (ref 3.5–5.1)
Sodium: 142 mEq/L (ref 136–145)
TOTAL PROTEIN: 6.6 g/dL (ref 6.4–8.3)

## 2014-07-14 LAB — CBC WITH DIFFERENTIAL/PLATELET
BASO%: 3 % — ABNORMAL HIGH (ref 0.0–2.0)
Basophils Absolute: 0.2 10*3/uL — ABNORMAL HIGH (ref 0.0–0.1)
EOS%: 10.8 % — ABNORMAL HIGH (ref 0.0–7.0)
Eosinophils Absolute: 0.3 10*3/uL (ref 0.0–0.5)
HCT: 39.5 % (ref 38.4–49.9)
HGB: 13.3 g/dL (ref 13.0–17.1)
LYMPH%: 20.9 % (ref 14.0–49.0)
MCH: 31.4 pg (ref 27.2–33.4)
MCHC: 33.7 g/dL (ref 32.0–36.0)
MCV: 93.2 fL (ref 79.3–98.0)
MONO#: 0.4 10*3/uL (ref 0.1–0.9)
MONO%: 13.2 % (ref 0.0–14.0)
NEUT#: 1.5 10*3/uL (ref 1.5–6.5)
NEUT%: 52.1 % (ref 39.0–75.0)
Platelets: 144 10*3/uL (ref 140–400)
RBC: 4.24 10*6/uL (ref 4.20–5.82)
RDW: 16 % — ABNORMAL HIGH (ref 11.0–14.6)
WBC: 3 10*3/uL — ABNORMAL LOW (ref 4.0–10.3)
lymph#: 0.6 10*3/uL — ABNORMAL LOW (ref 0.9–3.3)

## 2014-07-14 LAB — LACTATE DEHYDROGENASE (CC13): LDH: 145 U/L (ref 125–245)

## 2014-07-14 LAB — TECHNOLOGIST REVIEW

## 2014-07-14 MED ORDER — METHYLPREDNISOLONE 4 MG PO KIT: PACK | ORAL | Status: DC

## 2014-07-14 MED ORDER — METHYLPREDNISOLONE (PAK) 4 MG PO TABS: ORAL_TABLET | ORAL | Status: DC

## 2014-07-14 MED ORDER — OXYCODONE-ACETAMINOPHEN 10-325 MG PO TABS: 1.0000 | ORAL_TABLET | Freq: Four times a day (QID) | ORAL | Status: DC | PRN

## 2014-07-14 MED ORDER — IOHEXOL 300 MG/ML  SOLN
50.0000 mL | Freq: Once | INTRAMUSCULAR | Status: AC | PRN
  Administered 2014-07-14: 75 mL via INTRAVENOUS

## 2014-07-14 NOTE — Telephone Encounter (Signed)
Med

## 2014-07-14 NOTE — Telephone Encounter (Signed)
THE RESULTS WERE CALLED AND FAXED. THE REPORT WAS GIVEN TO DR.[name].

## 2014-07-14 NOTE — Telephone Encounter (Signed)
gv and printed appt sched and avs for pt for Jan..Marland KitchenMarland KitchenMarland Kitchenpt sched to see Dr. Sondra Come 1.6 @ 3:pm..Marland KitchenMarland Kitchen

## 2014-07-14 NOTE — Telephone Encounter (Signed)
gv and printed appt sched an davs for pt for Jan.. °

## 2014-07-14 NOTE — Progress Notes (Signed)
[facility] Telephone:(336) [phone]   Fax:(336) [place], MD [place] [address]  DIAGNOSIS: Stage IIA multiple myeloma diagnosed in July of 2013.   PRIOR THERAPY:  1) Systemic chemotherapy with subcutaneous Velcade 1.3 mg/M2 on days 1, 4, 8 and 11 every 3 weeks in addition to Revlimid 25 mg by mouth daily for 14 days every 3 weeks and dexamethasone 40 mg on a weekly basis. Status post 4 planned cycles.  2) Systemic chemotherapy with subcutaneous Velcade 1.3 mg/M2 on days 1, 4, 8 and 11 every 3 weeks in addition to Revlimid 25 mg by mouth daily for 14 days every 3 weeks and dexamethasone 40 mg on a weekly basis. 4 more cycles are planned and he is status post 4 cycles of the second set of 4 cycles, now status post a total of 10 cycles. 3) Systemic chemotherapy with Carfilzomib 36 mg/M2 on days 1, 2, 8, 9, 15 and 16 in addition to cyclophosphamide 300 mg/M2 IV on days 1, 8 and 15 as well as dexamethasone on days 1, 8, 15 and 22 every 4 weeks. Status post 4 cycles. 4) Zometa 4 mg IV every month. 5) autologous peripheral blood stem cell transplant on 03/20/2013 at [facility]. 6) Consolidation chemotherapy with Carfilzomib 36 mg/M2 on days 1, 2, 8, 9, 15 and 16 every 4 weeks in addition to cyclophosphamide 300 mg/M2 on days 1, 8 and 15 as well as dexamethasone 40 mg on a weekly basis every 4 weeks. Status post 2 cycles. First cycle 06/11/2013. 7) Maintenance therapy with Revlimid 10 mg by mouth daily status post 3 months of treatment.   CURRENT THERAPY:  1) Maintenance therapy with Revlimid 15 mg by mouth daily started 11/09/2013. 2) Zometa 4 mg IV every 2 months. 3) Xarelto 20 mg by mouth daily for pulmonary embolism,.   INTERVAL HISTORY: [name] 74 y.o. male returns to the clinic today for urgent followup visit. The patient has on vacation in [place] since 07/02/2014. The day after arrival he  started having severe headache and pain on the left lower portion of the back of his head. He start taking hydrocodone and Percocet at regular basis with minimal improvement. He just came back home this weekend and he requested urgent evaluation for this condition. He is currently on treatment with maintenance Revlimid. He is tolerating his treatment with Revlimid fairly well with no significant adverse effects. He is also on Xarelto for history of pulmonary embolism and has no bleeding issues. He has some skin rash on the upper extremities. He denied having any significant fatigue or weakness. He denied having any fever or chills. He has no nausea or vomiting.  He denied having any significant chest pain, shortness of breath, cough or hemoptysis.   MEDICAL HISTORY: Past Medical History  Diagnosis Date  . GERD (gastroesophageal reflux disease)   . Adenomatous polyp of colon 1996  . Arthritis     Osteoarthritis right knee, spine, wrist  . Benign prostatic hypertrophy     (Dr. Karsten Ro)  . History of chronic prostatitis   . History of melanoma     Back (Dr. Wilhemina Bonito); early melanoma R arm 03/2011  . Diverticulosis   . Arthritis of hand, right     Right thumb (Dr. Daylene Katayama)  . Degenerative disc disease   . Hemorrhoids   . Erosive esophagitis   . Back ache  r/o Multiple Myeloma  . Multiple myeloma 2013  . DVT (deep venous thrombosis) 01/2014    R calf  . Pulmonary embolism 01/2014    ALLERGIES:  is allergic to penicillins.  MEDICATIONS:  Current Outpatient Prescriptions  Medication Sig Dispense Refill  . acetaminophen (TYLENOL) 325 MG tablet Take 650 mg by mouth. prn    . ascorbic acid (VITAMIN C) 500 MG tablet Take 500 mg by mouth daily.    . B Complex-C (B-COMPLEX WITH VITAMIN C) tablet Take 1 tablet by mouth daily.    . cholecalciferol (VITAMIN D) 1000 UNITS tablet Take 1,000 Units by mouth daily.    . fish oil-omega-3 fatty acids 1000 MG capsule Take 1 g by mouth daily.     Marland Kitchen  HYDROcodone-acetaminophen (NORCO) 7.5-325 MG per tablet Take 1 tablet by mouth every 6 (six) hours as needed for moderate pain. 30 tablet 0  . lenalidomide (REVLIMID) 15 MG capsule Take 1 capsule (15 mg total) by mouth daily. 28 capsule 0  . methocarbamol (ROBAXIN) 500 MG tablet Take 1-2 tablets (500-1,000 mg total) by mouth every 6 (six) hours as needed for muscle spasms. 60 tablet 0  . Multiple Vitamin (MULTIVITAMIN WITH MINERALS) TABS Take 1 tablet by mouth daily.    . pantoprazole (PROTONIX) 40 MG tablet Take 1 tablet (40 mg total) by mouth 2 (two) times daily. Take 1 tablet (40 mg total) by mouth Two (2) times a day. 60 tablet 5  . Probiotic Product (ALIGN) 4 MG CAPS Take 1 capsule by mouth daily.    Alveda Reasons 20 MG TABS tablet TAKE 1 TABLET BY MOUTH DAILY WITH SUPPER (Patient taking differently: TAKE 1 TABLET BY MOUTH DAILY) 90 tablet 0  . zolendronic acid (ZOMETA) 4 MG/5ML injection Inject 4 mg into the vein every 28 (twenty-eight) days.    Marland Kitchen dicyclomine (BENTYL) 10 MG capsule Take 1 tab every 6 hours as needed for cramping, bloating, diarrhea. (Patient not taking: Reported on 06/04/2014) 90 capsule 1  . ondansetron (ZOFRAN) 4 MG tablet Take 1 tab every 6 hours as needed for nausea (Patient not taking: Reported on 06/04/2014) 40 tablet 1  . prochlorperazine (COMPAZINE) 10 MG tablet Take 1 tablet (10 mg total) by mouth every 6 (six) hours as needed. (Patient not taking: Reported on 06/04/2014) 30 tablet 1  . temazepam (RESTORIL) 15 MG capsule Take 15 mg by mouth at bedtime as needed.     No current facility-administered medications for this visit.   Facility-Administered Medications Ordered in Other Visits  Medication Dose Route Frequency Provider Last Rate Last Dose  . ondansetron (ZOFRAN) tablet 8 mg  8 mg Oral Once Curt Bears, MD      . sodium chloride 0.9 % injection 10 mL  10 mL Intracatheter PRN Curt Bears, MD   10 mL at 06/11/13 1616    SURGICAL HISTORY:  Past Surgical  History  Procedure Laterality Date  . Total knee replaced  2000    Left knee  . Appendectomy    . Colonoscopy  2009    adenomatous polyp  . Esophagogastroduodenoscopy  2009    mild inflammation at esophagogastric junction  . Excision of melanoma      back ( x3)  . Great toe surgery      bilateral  . Inguinal hernia repair      left, x 2  . Total knee arthroplasty  06/08/2011    Procedure: TOTAL KNEE ARTHROPLASTY;  Surgeon: Ninetta Lights, MD;  Location: [facility]  OR;  Service: Orthopedics;  Laterality: Right;  osteonics  . [place]  10/2012  . Bone marrow transplant  03/2013    Stem Cell    REVIEW OF SYSTEMS:  A comprehensive review of systems was negative except for: Constitutional: positive for fatigue Integument/breast: positive for dryness and rash Neurological: positive for headaches   PHYSICAL EXAMINATION: General appearance: alert, cooperative and no distress Head: Normocephalic, without obvious abnormality, atraumatic Neck: no adenopathy, no JVD, supple, symmetrical, trachea midline and thyroid not enlarged, symmetric, no tenderness/mass/nodules Lymph nodes: Cervical, supraclavicular, and axillary nodes normal. Resp: clear to auscultation bilaterally Back: symmetric, no curvature. ROM normal. No CVA tenderness. Cardio: regular rate and rhythm, S1, S2 normal, no murmur, click, rub or gallop GI: soft, non-tender; bowel sounds normal; no masses,  no organomegaly Extremities: extremities normal, atraumatic, no cyanosis or edema Neurologic: Alert and oriented X 3, normal strength and tone. Normal symmetric reflexes. Normal coordination and gait  ECOG PERFORMANCE STATUS: 1 - Symptomatic but completely ambulatory  There were no vitals taken for this visit.  LABORATORY DATA: Lab Results  Component Value Date   WBC 4.0 06/18/2014   HGB 13.1 06/18/2014   HCT 40.2 06/18/2014   MCV 93.6 06/18/2014   PLT 161 06/18/2014      Chemistry      Component Value Date/Time   NA  140 06/18/2014 0807   NA 139 04/22/2014 1417   K 4.1 06/18/2014 0807   K 3.9 04/22/2014 1417   CL 107 04/22/2014 1417   CL 109* 12/31/2012 0759   CO2 24 06/18/2014 0807   CO2 22 04/22/2014 1417   BUN 12.4 06/18/2014 0807   BUN 10 04/22/2014 1417   CREATININE 1.0 06/18/2014 0807   CREATININE 1.1 04/22/2014 1417   CREATININE 0.97 11/18/2013 1045      Component Value Date/Time   CALCIUM 9.1 06/18/2014 0807   CALCIUM 8.8 04/22/2014 1417   ALKPHOS 57 06/18/2014 0807   ALKPHOS 47 04/22/2014 1417   AST 18 06/18/2014 0807   AST 18 04/22/2014 1417   ALT 22 06/18/2014 0807   ALT 22 04/22/2014 1417   BILITOT 0.61 06/18/2014 0807   BILITOT 0.8 04/22/2014 1417       RADIOGRAPHIC STUDIES: No results found.  ASSESSMENT AND PLAN: This is a very pleasant 74 years old white male with history of multiple myeloma status post induction chemotherapy with Velcade, Revlimid and dexamethasone followed by treatment with Carfilzomib, cyclophosphamide and dexamethasone with significant improvement in his disease followed by autologous peripheral blood stem cell transplant at [facility] on 03/20/2013. This was followed by 2 cycles of consolidation chemotherapy with Carfilzomib, Cytoxan and dexamethasone, and he tolerated it fairly well. He completed maintenance Revlimid 10 mg by mouth daily for 3 months and tolerating it fairly well. He started maintenance Revlimid with 15 mg by mouth daily in May 2015 and tolerating it well. I recommended for the patient to hold his Revlimid for now until we figure out the etiology of his headache. For the headache, I'll order CT scan of the head to rule out any lytic lesion or parenchymal involvement of the brain.  I will repeat his myeloma panel today. For the skin rash, I will start the patient on Medrol Dosepak. He will continue treatment with Zometa every 2 months.  For the pulmonary embolism, he will continue on Xarelto 20 mg by mouth daily as scheduled. He  was advised to call immediately if he has any concerning symptoms in the  interval. The patient voices understanding of current disease status and treatment options and is in agreement with the current care plan.  All questions were answered. The patient knows to call the clinic with any problems, questions or concerns. We can certainly see the patient much sooner if necessary.  Disclaimer: This note was dictated with voice recognition software. Similar sounding words can inadvertently be transcribed and may not be corrected upon review.

## 2014-07-15 ENCOUNTER — Other Ambulatory Visit: Payer: Medicare Other

## 2014-07-15 ENCOUNTER — Other Ambulatory Visit: Payer: Self-pay

## 2014-07-15 NOTE — Progress Notes (Signed)
Location/Histology of Brain Tumor: CT of Head from 07/14/14 shows "Subtle but suspicious lucent lesions in the calvarium which are increased in number since 2008, suggesting progression of multiple myeloma in this setting. No overtly destructive skull lesion identified.  Patient presented with symptoms of:  severe headache and pain on the left lower portion of the back of his head. He start taking hydrocodone and Percocet at regular basis with minimal improvement  Past or anticipated interventions, if any, per neurosurgery: no  Past or anticipated interventions, if any, per medical oncology: status post induction chemotherapy with Velcade, Revlimid and dexamethasone followed by treatment with Carfilzomib, cyclophosphamide and dexamethasone followed by autologous peripheral blood stem cell transplant at [facility] on 03/20/2013. This was followed by 2 cycles of consolidation chemotherapy with Carfilzomib, Cytoxan and dexamethasone.  He completed maintenance Revlimid 10 mg by mouth daily for 3 months.  He started maintenance Revlimid with 15 mg by mouth daily in May 2015.   Dose of Decadron, if applicable: on medrol dose pak  Recent neurologic symptoms, if any:   Seizures: no  Headaches: yes - they do not go away - taking percocet 10/325 mg q 6 hours and hydrocodone at night - keeps pain at 10/10.    Nausea: no  Dizziness/ataxia: yes occasionally  Difficulty with hand coordination: no  Focal numbness/weakness: no  Visual deficits/changes: sometimes has "fuzzy" vision  Confusion/Memory deficits: no  Painful bone metastases at present, if any: no  SAFETY ISSUES:  Prior radiation? no  Pacemaker/ICD? no  Possible current pregnancy? no  Is the patient on methotrexate? no  Additional Complaints / other details:  Patient also has a history of melanoma.  Patient just returned from a trip to [place] - pain started all of a sudden.  Patient says the pain is in the top, back portion on  the left side of his head that radiates down the side of his head.  Patient had a stem cell transplant 03/21/13.

## 2014-07-16 ENCOUNTER — Ambulatory Visit
Admission: RE | Admit: 2014-07-16 | Discharge: 2014-07-16 | Disposition: A | Discharge status: Home / Self Care | Payer: Medicare Other | Source: Ambulatory Visit | Attending: Radiation Oncology | Admitting: Radiation Oncology

## 2014-07-16 ENCOUNTER — Encounter: Payer: Self-pay | Admitting: Radiation Oncology

## 2014-07-16 VITALS — BP 110/72 | HR 65 | Temp 98.0°F | Resp 16 | Ht 69.0 in | Wt 218.0 lb

## 2014-07-16 DIAGNOSIS — Z87891 Personal history of nicotine dependence: Secondary | ICD-10-CM | POA: Diagnosis not present

## 2014-07-16 DIAGNOSIS — Z86711 Personal history of pulmonary embolism: Secondary | ICD-10-CM | POA: Diagnosis not present

## 2014-07-16 DIAGNOSIS — Z96651 Presence of right artificial knee joint: Secondary | ICD-10-CM | POA: Diagnosis not present

## 2014-07-16 DIAGNOSIS — Z79899 Other long term (current) drug therapy: Secondary | ICD-10-CM | POA: Insufficient documentation

## 2014-07-16 DIAGNOSIS — C9000 Multiple myeloma not having achieved remission: Secondary | ICD-10-CM

## 2014-07-16 DIAGNOSIS — Z86718 Personal history of other venous thrombosis and embolism: Secondary | ICD-10-CM | POA: Insufficient documentation

## 2014-07-16 DIAGNOSIS — K219 Gastro-esophageal reflux disease without esophagitis: Secondary | ICD-10-CM | POA: Diagnosis not present

## 2014-07-16 DIAGNOSIS — Z510 Encounter for antineoplastic radiation therapy: Secondary | ICD-10-CM | POA: Diagnosis not present

## 2014-07-16 DIAGNOSIS — Z7901 Long term (current) use of anticoagulants: Secondary | ICD-10-CM | POA: Diagnosis not present

## 2014-07-16 LAB — BETA 2 MICROGLOBULIN, SERUM: Beta-2 Microglobulin: 2.83 mg/L — ABNORMAL HIGH (ref <=2.51)

## 2014-07-16 LAB — KAPPA/LAMBDA LIGHT CHAINS
Kappa free light chain: 3.12 mg/dL — ABNORMAL HIGH (ref 0.33–1.94)
Kappa:Lambda Ratio: 1.18 (ref 0.26–1.65)
Lambda Free Lght Chn: 2.65 mg/dL — ABNORMAL HIGH (ref 0.57–2.63)

## 2014-07-16 LAB — IGG, IGA, IGM
IgA: 180 mg/dL (ref 68–379)
IgG (Immunoglobin G), Serum: 1090 mg/dL (ref 650–1600)
IgM, Serum: 30 mg/dL — ABNORMAL LOW (ref 41–251)

## 2014-07-16 MED ORDER — LORAZEPAM 1 MG PO TABS: 1.0000 mg | ORAL_TABLET | Freq: Three times a day (TID) | ORAL | Status: DC | PRN

## 2014-07-16 NOTE — Progress Notes (Signed)
Radiation Oncology         [phone] ________________________________  Initial Outpatient Consultation  Name: [name] MRN: [id number]  Date: 07/16/2014  DOB: 09-13-1940  VZ:CHYIF,OYD A, MD  Curt Bears, MD   REFERRING PHYSICIAN: Curt Bears, MD  DIAGNOSIS: Stage IIA multiple myeloma   HISTORY OF PRESENT ILLNESS::[name] is a 74 y.o. male who is seen out of the courtesy of Dr. [name] for an opinion concerning radiation therapy as part of management of the patient's multiple myeloma. Patient was diagnosed in July 2013. He did undergo an autologous  peripheral stem cell transplant in September 2014 at [facility] Patient has been on maintenance Revlimid most recently. He was doing well until recently when he developed a severe pain along the left posterior skull while vacationing in [place]. He returned for evaluation of this issue. A head CT scan was performed which showed no evidence of bleed or parenchymal disease within the brain however there was subtle changes along the skull consistent with progressive multiple myeloma. With these findings the patient is now seen in radiation oncology for consideration for palliative treatments  PREVIOUS RADIATION THERAPY: No  PAST MEDICAL HISTORY:  has a past medical history of GERD (gastroesophageal reflux disease); Adenomatous polyp of colon (1996); Arthritis; Benign prostatic hypertrophy; History of chronic prostatitis; History of melanoma; Diverticulosis; Arthritis of hand, right; Degenerative disc disease; Hemorrhoids; Erosive esophagitis; Back ache; Multiple myeloma (2013); DVT (deep venous thrombosis) (01/2014); and Pulmonary embolism (01/2014).    PAST SURGICAL HISTORY: Past Surgical History  Procedure Laterality Date  . Total knee replaced  2000    Left knee  . Appendectomy    . Colonoscopy  2009    adenomatous polyp  . Esophagogastroduodenoscopy  2009    mild inflammation at esophagogastric junction  .  Excision of melanoma      back ( x3)  . Great toe surgery      bilateral  . Inguinal hernia repair      left, x 2  . Total knee arthroplasty  06/08/2011    Procedure: TOTAL KNEE ARTHROPLASTY;  Surgeon: Ninetta Lights, MD;  Location: [facility];  Service: Orthopedics;  Laterality: Right;  osteonics  . [place]  10/2012  . Bone marrow transplant  03/2013    Stem Cell    FAMILY HISTORY: family history includes Dementia in his mother; Diabetes in his father; Hypertension in his father; Prostate cancer in his maternal grandfather; Stroke in his father. There is no history of Anesthesia problems.  SOCIAL HISTORY:  reports that he quit smoking about 38 years ago. His smoking use included Cigarettes. He has a 30 pack-year smoking history. He has never used smokeless tobacco. He reports that he drinks about 1.5 oz of alcohol per week. He reports that he does not use illicit drugs.  ALLERGIES: Penicillins  MEDICATIONS:  Current Outpatient Prescriptions  Medication Sig Dispense Refill  . ascorbic acid (VITAMIN C) 500 MG tablet Take 500 mg by mouth daily.    . B Complex-C (B-COMPLEX WITH VITAMIN C) tablet Take 1 tablet by mouth daily.    . cholecalciferol (VITAMIN D) 1000 UNITS tablet Take 1,000 Units by mouth daily.    . fish oil-omega-3 fatty acids 1000 MG capsule Take 1 g by mouth daily.     Marland Kitchen HYDROcodone-acetaminophen (NORCO) 7.5-325 MG per tablet Take 1 tablet by mouth every 6 (six) hours as needed for moderate pain. 30 tablet 0  . lenalidomide (REVLIMID) 15 MG capsule  Take 1 capsule (15 mg total) by mouth daily. 28 capsule 0  . methocarbamol (ROBAXIN) 500 MG tablet Take 1-2 tablets (500-1,000 mg total) by mouth every 6 (six) hours as needed for muscle spasms. 60 tablet 0  . methylPREDNISolone (MEDROL DOSEPAK) 4 MG tablet follow package directions 21 tablet 0  . Multiple Vitamin (MULTIVITAMIN WITH MINERALS) TABS Take 1 tablet by mouth daily.    Marland Kitchen oxyCODONE-acetaminophen (PERCOCET) 10-325 MG  per tablet Take 1 tablet by mouth every 6 (six) hours as needed for pain. 60 tablet 0  . pantoprazole (PROTONIX) 40 MG tablet Take 1 tablet (40 mg total) by mouth 2 (two) times daily. Take 1 tablet (40 mg total) by mouth Two (2) times a day. 60 tablet 5  . Probiotic Product (ALIGN) 4 MG CAPS Take 1 capsule by mouth daily.    . temazepam (RESTORIL) 15 MG capsule Take 15 mg by mouth at bedtime as needed.    Alveda Reasons 20 MG TABS tablet TAKE 1 TABLET BY MOUTH DAILY WITH SUPPER (Patient taking differently: TAKE 1 TABLET BY MOUTH DAILY) 90 tablet 0  . zolendronic acid (ZOMETA) 4 MG/5ML injection Inject 4 mg into the vein every 28 (twenty-eight) days.    Marland Kitchen dicyclomine (BENTYL) 10 MG capsule Take 1 tab every 6 hours as needed for cramping, bloating, diarrhea. (Patient not taking: Reported on 06/04/2014) 90 capsule 1  . LORazepam (ATIVAN) 1 MG tablet Take 1 tablet (1 mg total) by mouth every 8 (eight) hours as needed for anxiety. 30 tablet 0  . ondansetron (ZOFRAN) 4 MG tablet Take 1 tab every 6 hours as needed for nausea (Patient not taking: Reported on 06/04/2014) 40 tablet 1  . prochlorperazine (COMPAZINE) 10 MG tablet Take 1 tablet (10 mg total) by mouth every 6 (six) hours as needed. (Patient not taking: Reported on 06/04/2014) 30 tablet 1   No current facility-administered medications for this encounter.   Facility-Administered Medications Ordered in Other Encounters  Medication Dose Route Frequency Provider Last Rate Last Dose  . ondansetron (ZOFRAN) tablet 8 mg  8 mg Oral Once Curt Bears, MD      . sodium chloride 0.9 % injection 10 mL  10 mL Intracatheter PRN Curt Bears, MD   10 mL at 06/11/13 1616    REVIEW OF SYSTEMS:  A 15 point review of systems is documented in the electronic medical record. This was obtained by the nursing staff. However, I reviewed this with the patient to discuss relevant findings and make appropriate changes.  The patient complains of intense pain along the left  occipital skull area. Without medication he rates this as a 10 out of 10. With medication this is still 7. He denies any other areas of pain. Patient denies any double vision or visual aurora. He does complain of some blurred vision today but attributes this to his recent Percocet.   PHYSICAL EXAM:  height is _0  (1.753 m) and weight is 218 lb (98.884 kg). His oral temperature is 98 F (36.7 C). His blood pressure is 110/72 and his pulse is 65. His respiration is 16 and oxygen saturation is 96%.   BP 110/72 mmHg  Pulse 65  Temp(Src) 98 F (36.7 C) (Oral)  Resp 16  Ht _1  (1.753 m)  Wt 218 lb (98.884 kg)  BMI 32.18 kg/m2  SpO2 96%  General Appearance:    Alert, cooperative, no distress, appears stated age, healthy-appearing   Head:    Normocephalic, without obvious abnormality, atraumatic,  point tenderness with palpation in the left occipital parietal skull. No swelling or masses noted   Eyes:    PERRL, conjunctiva/corneas clear, EOM's intact,             Nose:   Nares normal, septum midline, mucosa normal, no drainage    or sinus tenderness  Throat:   Lips, mucosa, and tongue normal; teeth good repair, gums normal  Neck:   Supple, symmetrical, trachea midline, no adenopathy;       thyroid:  No enlargement/tenderness/nodules; no carotid   bruit or JVD  Back:     Symmetric, no curvature, ROM normal, no CVA tenderness  Lungs:     Clear to auscultation bilaterally, respirations unlabored  Chest wall:    No tenderness or deformity  Heart:    Regular rate and rhythm, S1 and S2 normal, no murmur, rub   or gallop  Abdomen:     Soft, non-tender, bowel sounds active all four quadrants,    no masses, no organomegaly        Extremities:   Extremities normal, atraumatic, no cyanosis or edema  Pulses:   2+ and symmetric all extremities  Skin:   Skin color, texture, turgor normal, no rashes or lesions  Lymph nodes:   Cervical, supraclavicular, and axillary nodes normal  Neurologic:   Normal  strength, sensation and reflexes      throughout     ECOG = 1  1 - Symptomatic but completely ambulatory (Restricted in physically strenuous activity but ambulatory and able to carry out work of a light or sedentary nature. For example, light housework, office work)  LABORATORY DATA:  Lab Results  Component Value Date   WBC 3.0* 07/14/2014   HGB 13.3 07/14/2014   HCT 39.5 07/14/2014   MCV 93.2 07/14/2014   PLT 144 07/14/2014   NEUTROABS 1.5 07/14/2014   Lab Results  Component Value Date   NA 142 07/14/2014   K 3.8 07/14/2014   CL 107 04/22/2014   CO2 27 07/14/2014   GLUCOSE 93 07/14/2014   CREATININE 1.0 07/14/2014   CALCIUM 9.4 07/14/2014      RADIOGRAPHY: Ct Head W Wo Contrast  07/14/2014   CLINICAL DATA:  74 year old male with severe posterior left side headache, with pain radiating to the left neck. Symptoms since travel to [place]. Current history of multiple myeloma on medical therapy. Subsequent encounter.  EXAM: CT HEAD WITHOUT AND WITH CONTRAST  TECHNIQUE: Contiguous axial images were obtained from the base of the skull through the vertex without and with intravenous contrast  CONTRAST:  29m OMNIPAQUE IOHEXOL 300 MG/ML  SOLN  COMPARISON:  Brain MRI 07/31/2013.  Head CT 05/03/2007.  FINDINGS: Mild left maxillary alveolar recess mucosal thickening, other visible paranasal sinuses and mastoids are clear.  There are multiple small round lucent lesions in the calvarium, most numerous near the vertex, which ordinarily would be favored to be benign lesions. However, these do appear increased in number, and some lesions increased in size, since 2008. Bone window images today are thinner than in 2008, limiting direct comparison of some lesions. Incidental benign appearing occasional sclerotic lesions, including above the left orbit and at the left frontal sinus.  Negative orbit and scalp soft tissues.  Cerebral volume has mildly decreased since 2008. No ventriculomegaly. No midline  shift, mass effect, or evidence of intracranial mass lesion. No acute intracranial hemorrhage identified. No evidence of cortically based acute infarction identified. No abnormal enhancement identified. Major intracranial vascular structures are enhancing.  IMPRESSION: 1. Subtle but suspicious lucent lesions in the calvarium which are increased in number since 2008, suggesting progression of multiple myeloma in this setting. No overtly destructive skull lesion identified. 2. Negative for age CT appearance of the brain.  These results will be called to the ordering clinician or representative by the Radiologist Assistant, and communication documented in the PACS or zVision Dashboard.   Electronically Signed   By: Lars Pinks M.D.   On: 07/14/2014 14:04      IMPRESSION: Multiple myeloma likely in early relapse.  The patient's head CT scan shows subtle lucent areas within the skull suggestive of progressive multiple myeloma. This is likely explaining the patient's pain. There are no other findings on his head CT scan to explain his symptoms. Patient would be a candidate for palliative radiation therapy directed at this area. I discussed the treatment course side effects and potential toxicities of radiation therapy in this situation with the patient. He appears to understand and wishes to proceed with planned course of treatment.  PLAN: Simulation and planning January 7 with treatments to begin January 11. I anticipate approximately 2 weeks of radiation therapy.  I spent 60 minutes minutes face to face with the patient and more than 50% of that time was spent in counseling and/or coordination of care.   ------------------------------------------------  Blair Promise, PhD, MD

## 2014-07-16 NOTE — Progress Notes (Signed)
Please see the Nurse Progress Note in the MD Initial Consult Encounter for this patient. 

## 2014-07-17 ENCOUNTER — Ambulatory Visit
Admission: RE | Admit: 2014-07-17 | Discharge: 2014-07-17 | Disposition: A | Discharge status: Home / Self Care | Payer: Medicare Other | Source: Ambulatory Visit | Attending: Radiation Oncology | Admitting: Radiation Oncology

## 2014-07-17 ENCOUNTER — Telehealth: Payer: Self-pay | Admitting: *Deleted

## 2014-07-17 DIAGNOSIS — Z86711 Personal history of pulmonary embolism: Secondary | ICD-10-CM | POA: Diagnosis not present

## 2014-07-17 DIAGNOSIS — Z79899 Other long term (current) drug therapy: Secondary | ICD-10-CM | POA: Diagnosis not present

## 2014-07-17 DIAGNOSIS — K219 Gastro-esophageal reflux disease without esophagitis: Secondary | ICD-10-CM | POA: Diagnosis not present

## 2014-07-17 DIAGNOSIS — Z7901 Long term (current) use of anticoagulants: Secondary | ICD-10-CM | POA: Diagnosis not present

## 2014-07-17 DIAGNOSIS — Z86718 Personal history of other venous thrombosis and embolism: Secondary | ICD-10-CM | POA: Diagnosis not present

## 2014-07-17 DIAGNOSIS — C9000 Multiple myeloma not having achieved remission: Secondary | ICD-10-CM | POA: Diagnosis not present

## 2014-07-17 DIAGNOSIS — Z96651 Presence of right artificial knee joint: Secondary | ICD-10-CM | POA: Diagnosis not present

## 2014-07-17 DIAGNOSIS — Z87891 Personal history of nicotine dependence: Secondary | ICD-10-CM | POA: Diagnosis not present

## 2014-07-17 DIAGNOSIS — Z510 Encounter for antineoplastic radiation therapy: Secondary | ICD-10-CM | POA: Diagnosis not present

## 2014-07-17 NOTE — Telephone Encounter (Signed)
Per Dr Vista Mink, okay for patient to restart revlimid.  Informed patient, he verbalized understanding.

## 2014-07-17 NOTE — Progress Notes (Signed)
  Radiation Oncology         [phone] ________________________________  Name: [name] MRN: [id number]  Date: 07/17/2014  DOB: 1941-03-09  SIMULATION AND TREATMENT PLANNING NOTE    ICD-9-CM ICD-10-CM   1. Multiple myeloma 203.00 C90.00     DIAGNOSIS:  Stage II A multiple myeloma with skull pain  NARRATIVE:  The patient was brought to the [facility].  Identity was confirmed.  All relevant records and images related to the planned course of therapy were reviewed.  The patient freely provided informed written consent to proceed with treatment after reviewing the details related to the planned course of therapy. The consent form was witnessed and verified by the simulation staff.  Then, the patient was set-up in a stable reproducible  supine position for radiation therapy.  CT images were obtained.  Surface markings were placed.  The CT images were loaded into the planning software.  Then the target and avoidance structures were contoured.  Treatment planning then occurred.  The radiation prescription was entered and confirmed.  Then, I designed and supervised the construction of a total of 1 medically necessary complex treatment devices.  I have requested : 3D Simulation  I have requested a DVH of the following structures: PTV, lens, eyes.  I have ordered:dose calc.  PLAN:  The patient will receive 25 Gy in 10 fractions.  ________________________________  -----------------------------------  Blair Promise, PhD, MD

## 2014-07-21 ENCOUNTER — Ambulatory Visit (HOSPITAL_BASED_OUTPATIENT_CLINIC_OR_DEPARTMENT_OTHER)
Admission: RE | Admit: 2014-07-21 | Discharge: 2014-07-21 | Disposition: A | Discharge status: Home / Self Care | Payer: Medicare Other | Source: Ambulatory Visit | Attending: Radiation Oncology | Admitting: Radiation Oncology

## 2014-07-21 DIAGNOSIS — Z79899 Other long term (current) drug therapy: Secondary | ICD-10-CM | POA: Diagnosis not present

## 2014-07-21 DIAGNOSIS — Z96651 Presence of right artificial knee joint: Secondary | ICD-10-CM | POA: Diagnosis not present

## 2014-07-21 DIAGNOSIS — K219 Gastro-esophageal reflux disease without esophagitis: Secondary | ICD-10-CM | POA: Diagnosis not present

## 2014-07-21 DIAGNOSIS — Z7901 Long term (current) use of anticoagulants: Secondary | ICD-10-CM | POA: Diagnosis not present

## 2014-07-21 DIAGNOSIS — Z86718 Personal history of other venous thrombosis and embolism: Secondary | ICD-10-CM | POA: Diagnosis not present

## 2014-07-21 DIAGNOSIS — Z510 Encounter for antineoplastic radiation therapy: Secondary | ICD-10-CM | POA: Diagnosis not present

## 2014-07-21 DIAGNOSIS — Z86711 Personal history of pulmonary embolism: Secondary | ICD-10-CM | POA: Diagnosis not present

## 2014-07-21 DIAGNOSIS — Z87891 Personal history of nicotine dependence: Secondary | ICD-10-CM | POA: Diagnosis not present

## 2014-07-21 DIAGNOSIS — C9000 Multiple myeloma not having achieved remission: Secondary | ICD-10-CM | POA: Diagnosis not present

## 2014-07-22 ENCOUNTER — Encounter: Payer: Self-pay | Admitting: Radiation Oncology

## 2014-07-22 ENCOUNTER — Telehealth: Payer: Self-pay | Admitting: Internal Medicine

## 2014-07-22 ENCOUNTER — Ambulatory Visit
Admission: RE | Admit: 2014-07-22 | Discharge: 2014-07-22 | Disposition: A | Discharge status: Home / Self Care | Payer: Medicare Other | Source: Ambulatory Visit | Attending: Radiation Oncology | Admitting: Radiation Oncology

## 2014-07-22 ENCOUNTER — Encounter: Payer: Self-pay | Admitting: Internal Medicine

## 2014-07-22 ENCOUNTER — Encounter: Payer: Self-pay | Admitting: *Deleted

## 2014-07-22 ENCOUNTER — Other Ambulatory Visit: Payer: Self-pay | Admitting: *Deleted

## 2014-07-22 ENCOUNTER — Ambulatory Visit (HOSPITAL_BASED_OUTPATIENT_CLINIC_OR_DEPARTMENT_OTHER): Payer: Medicare Other | Admitting: Internal Medicine

## 2014-07-22 VITALS — BP 118/71 | HR 68 | Temp 98.0°F | Resp 18 | Ht 69.0 in | Wt 213.8 lb

## 2014-07-22 VITALS — BP 108/80 | HR 67 | Temp 97.4°F | Resp 16 | Ht 69.0 in | Wt 217.6 lb

## 2014-07-22 DIAGNOSIS — C9000 Multiple myeloma not having achieved remission: Secondary | ICD-10-CM | POA: Diagnosis not present

## 2014-07-22 DIAGNOSIS — Z79899 Other long term (current) drug therapy: Secondary | ICD-10-CM | POA: Diagnosis not present

## 2014-07-22 DIAGNOSIS — Z87891 Personal history of nicotine dependence: Secondary | ICD-10-CM | POA: Diagnosis not present

## 2014-07-22 DIAGNOSIS — I2699 Other pulmonary embolism without acute cor pulmonale: Secondary | ICD-10-CM

## 2014-07-22 DIAGNOSIS — Z510 Encounter for antineoplastic radiation therapy: Secondary | ICD-10-CM | POA: Diagnosis not present

## 2014-07-22 DIAGNOSIS — Z9484 Stem cells transplant status: Secondary | ICD-10-CM

## 2014-07-22 DIAGNOSIS — Z86711 Personal history of pulmonary embolism: Secondary | ICD-10-CM | POA: Diagnosis not present

## 2014-07-22 DIAGNOSIS — K219 Gastro-esophageal reflux disease without esophagitis: Secondary | ICD-10-CM | POA: Diagnosis not present

## 2014-07-22 DIAGNOSIS — Z7901 Long term (current) use of anticoagulants: Secondary | ICD-10-CM | POA: Diagnosis not present

## 2014-07-22 DIAGNOSIS — Z86718 Personal history of other venous thrombosis and embolism: Secondary | ICD-10-CM | POA: Diagnosis not present

## 2014-07-22 DIAGNOSIS — Z96651 Presence of right artificial knee joint: Secondary | ICD-10-CM | POA: Diagnosis not present

## 2014-07-22 MED ORDER — LENALIDOMIDE 15 MG PO CAPS: 15.0000 mg | ORAL_CAPSULE | Freq: Every day | ORAL | Status: DC

## 2014-07-22 MED ORDER — BIAFINE EX EMUL
Freq: Once | CUTANEOUS | Status: AC
  Administered 2014-07-22: 15:00:00 via TOPICAL

## 2014-07-22 NOTE — Progress Notes (Signed)
[facility] [phone]   [phone]) [place], [place] [address]  DIAGNOSIS: Stage IIA multiple myeloma diagnosed in July of 2013.   PRIOR THERAPY:  1) Systemic chemotherapy with subcutaneous Velcade 1.3 mg/M2 on days 1, 4, 8 and 11 every 3 weeks in addition to Revlimid 25 mg by mouth daily for 14 days every 3 weeks and dexamethasone 40 mg on a weekly basis. Status post 4 planned cycles.  2) Systemic chemotherapy with subcutaneous Velcade 1.3 mg/M2 on days 1, 4, 8 and 11 every 3 weeks in addition to Revlimid 25 mg by mouth daily for 14 days every 3 weeks and dexamethasone 40 mg on a weekly basis. 4 more cycles are planned and he is status post 4 cycles of the second set of 4 cycles, now status post a total of 10 cycles. 3) Systemic chemotherapy with Carfilzomib 36 mg/M2 on days 1, 2, 8, 9, 15 and 16 in addition to cyclophosphamide 300 mg/M2 IV on days 1, 8 and 15 as well as dexamethasone on days 1, 8, 15 and 22 every 4 weeks. Status post 4 cycles. 4) Zometa 4 mg IV every month. 5) autologous peripheral blood stem cell transplant on 03/20/2013 at [facility]. 6) Consolidation chemotherapy with Carfilzomib 36 mg/M2 on days 1, 2, 8, 9, 15 and 16 every 4 weeks in addition to cyclophosphamide 300 mg/M2 on days 1, 8 and 15 as well as dexamethasone 40 mg on a weekly basis every 4 weeks. Status post 2 cycles. First cycle 06/11/2013. 7) Maintenance therapy with Revlimid 10 mg by mouth daily status post 3 months of treatment.   CURRENT THERAPY:  1) Maintenance therapy with Revlimid 15 mg by mouth daily started 11/09/2013. 2) Zometa 4 mg IV every 2 months. 3) Xarelto 20 mg by mouth daily for pulmonary embolism. 4) palliative radiotherapy to the lytic lesion in the skull under the care of Dr. Sondra Come   INTERVAL HISTORY: [name] 74 y.o. male returns to the clinic today for urgent followup  visit. He continues to have pain on the left neck area as well as back of the head. CT scan of the head performed recently showed progressive lytic lesions in that area. He was referred to Dr. Sondra Come and started palliative radiotherapy to the lytic lesions. He is currently on Percocet 10/325 mg when necessary for pain. He is complaining of constipation and he using laxative at home with no improvement. He has some occasional rectal bleeding with the hard stool. He denied having any significant fatigue or weakness. He denied having any fever or chills. He has no nausea or vomiting.  He denied having any significant chest pain, shortness of breath, cough or hemoptysis. He had myeloma panel performed recently and he is here for evaluation and discussion of his lab results.  MEDICAL HISTORY: Past Medical History  Diagnosis Date  . GERD (gastroesophageal reflux disease)   . Adenomatous polyp of colon 1996  . Arthritis     Osteoarthritis right knee, spine, wrist  . Benign prostatic hypertrophy     (Dr. Karsten Ro)  . History of chronic prostatitis   . History of melanoma     Back (Dr. Wilhemina Bonito); early melanoma R arm 03/2011  . Diverticulosis   . Arthritis of hand, right     Right thumb (Dr. Daylene Katayama)  . Degenerative disc disease   . Hemorrhoids   . Erosive esophagitis   .  Back ache     r/o Multiple Myeloma  . Multiple myeloma 2013  . DVT (deep venous thrombosis) 01/2014    R calf  . Pulmonary embolism 01/2014    ALLERGIES:  is allergic to penicillins.  MEDICATIONS:  Current Outpatient Prescriptions  Medication Sig Dispense Refill  . ascorbic acid (VITAMIN C) 500 MG tablet Take 500 mg by mouth daily.    . B Complex-C (B-COMPLEX WITH VITAMIN C) tablet Take 1 tablet by mouth daily.    . cholecalciferol (VITAMIN D) 1000 UNITS tablet Take 1,000 Units by mouth daily.    Marland Kitchen dicyclomine (BENTYL) 10 MG capsule Take 1 tab every 6 hours as needed for cramping, bloating, diarrhea. 90 capsule 1  . fish  oil-omega-3 fatty acids 1000 MG capsule Take 1 g by mouth daily.     Marland Kitchen HYDROcodone-acetaminophen (NORCO) 7.5-325 MG per tablet Take 1 tablet by mouth every 6 (six) hours as needed for moderate pain. 30 tablet 0  . lenalidomide (REVLIMID) 15 MG capsule Take 1 capsule (15 mg total) by mouth daily. 28 capsule 0  . LORazepam (ATIVAN) 1 MG tablet Take 1 tablet (1 mg total) by mouth every 8 (eight) hours as needed for anxiety. 30 tablet 0  . methocarbamol (ROBAXIN) 500 MG tablet Take 1-2 tablets (500-1,000 mg total) by mouth every 6 (six) hours as needed for muscle spasms. 60 tablet 0  . Multiple Vitamin (MULTIVITAMIN WITH MINERALS) TABS Take 1 tablet by mouth daily.    . ondansetron (ZOFRAN) 4 MG tablet Take 1 tab every 6 hours as needed for nausea 40 tablet 1  . oxyCODONE-acetaminophen (PERCOCET) 10-325 MG per tablet Take 1 tablet by mouth every 6 (six) hours as needed for pain. 60 tablet 0  . pantoprazole (PROTONIX) 40 MG tablet Take 1 tablet (40 mg total) by mouth 2 (two) times daily. Take 1 tablet (40 mg total) by mouth Two (2) times a day. 60 tablet 5  . Probiotic Product (ALIGN) 4 MG CAPS Take 1 capsule by mouth daily.    . prochlorperazine (COMPAZINE) 10 MG tablet Take 1 tablet (10 mg total) by mouth every 6 (six) hours as needed. 30 tablet 1  . temazepam (RESTORIL) 15 MG capsule Take 15 mg by mouth at bedtime as needed.    Alveda Reasons 20 MG TABS tablet TAKE 1 TABLET BY MOUTH DAILY WITH SUPPER (Patient taking differently: TAKE 1 TABLET BY MOUTH DAILY) 90 tablet 0  . zolendronic acid (ZOMETA) 4 MG/5ML injection Inject 4 mg into the vein every 28 (twenty-eight) days.     No current facility-administered medications for this visit.   Facility-Administered Medications Ordered in Other Visits  Medication Dose Route Frequency Provider Last Rate Last Dose  . ondansetron (ZOFRAN) tablet 8 mg  8 mg Oral Once Curt Bears, MD      . sodium chloride 0.9 % injection 10 mL  10 mL Intracatheter PRN Curt Bears, MD   10 mL at 06/11/13 1616    SURGICAL HISTORY:  Past Surgical History  Procedure Laterality Date  . Total knee replaced  2000    Left knee  . Appendectomy    . Colonoscopy  2009    adenomatous polyp  . Esophagogastroduodenoscopy  2009    mild inflammation at esophagogastric junction  . Excision of melanoma      back ( x3)  . Great toe surgery      bilateral  . Inguinal hernia repair      left, x 2  .  Total knee arthroplasty  06/08/2011    Procedure: TOTAL KNEE ARTHROPLASTY;  Surgeon: Ninetta Lights, MD;  Location: [facility];  Service: Orthopedics;  Laterality: Right;  osteonics  . [place]  10/2012  . Bone marrow transplant  03/2013    Stem Cell    REVIEW OF SYSTEMS:  Constitutional: negative Eyes: negative Ears, nose, mouth, throat, and face: negative Respiratory: negative Cardiovascular: negative Gastrointestinal: positive for constipation Genitourinary:negative Integument/breast: negative Hematologic/lymphatic: negative Musculoskeletal:positive for bone pain Neurological: negative Behavioral/Psych: negative Endocrine: negative Allergic/Immunologic: negative   PHYSICAL EXAMINATION: General appearance: alert, cooperative and no distress Head: Normocephalic, without obvious abnormality, atraumatic Neck: no adenopathy, no JVD, supple, symmetrical, trachea midline and thyroid not enlarged, symmetric, no tenderness/mass/nodules Lymph nodes: Cervical, supraclavicular, and axillary nodes normal. Resp: clear to auscultation bilaterally Back: symmetric, no curvature. ROM normal. No CVA tenderness. Cardio: regular rate and rhythm, S1, S2 normal, no murmur, click, rub or gallop GI: soft, non-tender; bowel sounds normal; no masses,  no organomegaly Extremities: extremities normal, atraumatic, no cyanosis or edema Neurologic: Alert and oriented X 3, normal strength and tone. Normal symmetric reflexes. Normal coordination and gait  ECOG PERFORMANCE STATUS: 1 -  Symptomatic but completely ambulatory  Blood pressure 118/71, pulse 68, temperature 98 F (36.7 C), temperature source Oral, resp. rate 18, height $RemoveBe'5\' 9"'xBhJHgxpW$  (1.753 m), weight 213 lb 12.8 oz (96.979 kg), SpO2 99 %.  LABORATORY DATA: Lab Results  Component Value Date   WBC 3.0* 07/14/2014   HGB 13.3 07/14/2014   HCT 39.5 07/14/2014   MCV 93.2 07/14/2014   PLT 144 07/14/2014      Chemistry      Component Value Date/Time   NA 142 07/14/2014 1239   NA 139 04/22/2014 1417   K 3.8 07/14/2014 1239   K 3.9 04/22/2014 1417   CL 107 04/22/2014 1417   CL 109* 12/31/2012 0759   CO2 27 07/14/2014 1239   CO2 22 04/22/2014 1417   BUN 12.2 07/14/2014 1239   BUN 10 04/22/2014 1417   CREATININE 1.0 07/14/2014 1239   CREATININE 1.1 04/22/2014 1417   CREATININE 0.97 11/18/2013 1045      Component Value Date/Time   CALCIUM 9.4 07/14/2014 1239   CALCIUM 8.8 04/22/2014 1417   ALKPHOS 47 07/14/2014 1239   ALKPHOS 47 04/22/2014 1417   AST 28 07/14/2014 1239   AST 18 04/22/2014 1417   ALT 34 07/14/2014 1239   ALT 22 04/22/2014 1417   BILITOT 0.87 07/14/2014 1239   BILITOT 0.8 04/22/2014 1417     Myeloma panel: Beta-2 microglobulin 2.83, free kappa light chain 3.12, free lambda light chain 2.65 and kappa/lambda ratio 1.18. IgG 1090, IgA 180 and IgM 30.  RADIOGRAPHIC STUDIES: No results found.  ASSESSMENT AND PLAN: This is a very pleasant 74 years old white male with history of multiple myeloma status post induction chemotherapy with Velcade, Revlimid and dexamethasone followed by treatment with Carfilzomib, cyclophosphamide and dexamethasone with significant improvement in his disease followed by autologous peripheral blood stem cell transplant at [facility] on 03/20/2013. This was followed by 2 cycles of consolidation chemotherapy with Carfilzomib, Cytoxan and dexamethasone, and he tolerated it fairly well. He completed maintenance Revlimid 10 mg by mouth daily for 3 months and tolerating  it fairly well. He started maintenance Revlimid with 15 mg by mouth daily in May 2015 and tolerating it well. His recent myeloma panel showed no evidence for disease progression. I discussed the lab result with the patient today. I  recommended for him to continue his current treatment with maintenance Revlimid. He will continue with the palliative radiation to the lytic lesions under the care of Dr. Sondra Come He will continue treatment with Zometa every 2 months.  For the pulmonary embolism, he will continue on Xarelto 20 mg by mouth daily as scheduled. He would come back for follow-up visit in one month's for reevaluation with repeat blood work. He was advised to call immediately if he has any concerning symptoms in the interval. The patient voices understanding of current disease status and treatment options and is in agreement with the current care plan.  All questions were answered. The patient knows to call the clinic with any problems, questions or concerns. We can certainly see the patient much sooner if necessary.  Disclaimer: This note was dictated with voice recognition software. Similar sounding words can inadvertently be transcribed and may not be corrected upon review.

## 2014-07-22 NOTE — Progress Notes (Signed)
[name] has completed 2 fractions to his skull.  He reports pain at a 8/10 in the left back side of his head.  He oxycodone/acetaminophen 2 tablets per day and 1 tablet of 7.5 mg at night.  He is also feeling burning in the back of his head after treatment.   He was given the Radiation Therapy and You book and reviewed the potential side effects of skin chagres, hair loss and fatigue.

## 2014-07-22 NOTE — Addendum Note (Signed)
Encounter addended by: Jacqulyn Liner, RN on: 07/22/2014  2:46 PM<BR>     Documentation filed: Inpatient MAR

## 2014-07-22 NOTE — Progress Notes (Signed)
  Radiation Oncology         [phone] ________________________________  Name: [name] MRN: [id number]  Date: 07/22/2014  DOB: [birth year]  Weekly Radiation Therapy Management  Multiple myeloma   Staging form: Multiple Myeloma, AJCC 6th Edition     Clinical: Stage IIA -  Current Dose: 5 Gy     Planned Dose:  25 Gy  Narrative . . . . . . . . The patient presents for routine under treatment assessment.                                   The patient is without complaint. No change in his pain along the left occipital/parietal skull area                                 Set-up films were reviewed.                                 The chart was checked. Physical Findings. . .  height is _0  (1.753 m) and weight is 217 lb 9.6 oz (98.703 kg). His oral temperature is 97.4 F (36.3 C). His blood pressure is 108/80 and his pulse is 67. His respiration is 16. . Weight essentially stable.  No significant changes. No hair loss noted or scalp erythema.  Impression . . . . . . . The patient is tolerating radiation. Plan . . . . . . . . . . . . Continue treatment as planned.  ________________________________   Blair Promise, PhD, MD

## 2014-07-22 NOTE — Progress Notes (Signed)
[place] Psychosocial Distress Screening Clinical Social Work  Clinical Social Work was referred by distress screening protocol.  The patient scored a 10 on the Psychosocial Distress Thermometer which indicates severe distress. Clinical Social Worker contacted patient to assess for distress and other psychosocial needs. Mr. Maione reported he received "good news today from Dr. [name]" and he is currently "treating a few lesions from Dr. Sondra Come".  He reported he is in good spirits and has no concerns at this time.  CSW strongly encouraged patient to call with any questions or concerns.  ONCBCN DISTRESS SCREENING 07/16/2014  Screening Type Initial Screening  Distress experienced in past week (1-10) 10  Emotional problem type Nervousness/Anxiety  Physical Problem type Pain  Physician notified of physical symptoms Yes    Clinical Social Worker follow up needed: No.  If yes, follow up plan:  Polo Riley, MSW, LCSW, OSW-C Clinical Social Worker [facility] [phone]

## 2014-07-22 NOTE — Telephone Encounter (Signed)
gv adn printed appt sched anda vs for pt for Feb °

## 2014-07-22 NOTE — Addendum Note (Signed)
Encounter addended by: Jacqulyn Liner, RN on: 07/22/2014  2:45 PM<BR>     Documentation filed: Dx Association, Medications, Orders

## 2014-07-23 ENCOUNTER — Ambulatory Visit
Admission: RE | Admit: 2014-07-23 | Discharge: 2014-07-23 | Disposition: A | Discharge status: Home / Self Care | Payer: Medicare Other | Source: Ambulatory Visit | Attending: Radiation Oncology | Admitting: Radiation Oncology

## 2014-07-23 DIAGNOSIS — Z7901 Long term (current) use of anticoagulants: Secondary | ICD-10-CM | POA: Diagnosis not present

## 2014-07-23 DIAGNOSIS — Z86711 Personal history of pulmonary embolism: Secondary | ICD-10-CM | POA: Diagnosis not present

## 2014-07-23 DIAGNOSIS — K219 Gastro-esophageal reflux disease without esophagitis: Secondary | ICD-10-CM | POA: Diagnosis not present

## 2014-07-23 DIAGNOSIS — Z79899 Other long term (current) drug therapy: Secondary | ICD-10-CM | POA: Diagnosis not present

## 2014-07-23 DIAGNOSIS — Z510 Encounter for antineoplastic radiation therapy: Secondary | ICD-10-CM | POA: Diagnosis not present

## 2014-07-23 DIAGNOSIS — Z86718 Personal history of other venous thrombosis and embolism: Secondary | ICD-10-CM | POA: Diagnosis not present

## 2014-07-23 DIAGNOSIS — Z87891 Personal history of nicotine dependence: Secondary | ICD-10-CM | POA: Diagnosis not present

## 2014-07-23 DIAGNOSIS — Z96651 Presence of right artificial knee joint: Secondary | ICD-10-CM | POA: Diagnosis not present

## 2014-07-23 DIAGNOSIS — C9000 Multiple myeloma not having achieved remission: Secondary | ICD-10-CM | POA: Diagnosis not present

## 2014-07-24 ENCOUNTER — Ambulatory Visit
Admission: RE | Admit: 2014-07-24 | Discharge: 2014-07-24 | Disposition: A | Discharge status: Home / Self Care | Payer: Medicare Other | Source: Ambulatory Visit | Attending: Radiation Oncology | Admitting: Radiation Oncology

## 2014-07-24 DIAGNOSIS — Z79899 Other long term (current) drug therapy: Secondary | ICD-10-CM | POA: Diagnosis not present

## 2014-07-24 DIAGNOSIS — C9000 Multiple myeloma not having achieved remission: Secondary | ICD-10-CM | POA: Diagnosis not present

## 2014-07-24 DIAGNOSIS — Z7901 Long term (current) use of anticoagulants: Secondary | ICD-10-CM | POA: Diagnosis not present

## 2014-07-24 DIAGNOSIS — Z96651 Presence of right artificial knee joint: Secondary | ICD-10-CM | POA: Diagnosis not present

## 2014-07-24 DIAGNOSIS — Z86718 Personal history of other venous thrombosis and embolism: Secondary | ICD-10-CM | POA: Diagnosis not present

## 2014-07-24 DIAGNOSIS — Z86711 Personal history of pulmonary embolism: Secondary | ICD-10-CM | POA: Diagnosis not present

## 2014-07-24 DIAGNOSIS — Z510 Encounter for antineoplastic radiation therapy: Secondary | ICD-10-CM | POA: Diagnosis not present

## 2014-07-24 DIAGNOSIS — Z87891 Personal history of nicotine dependence: Secondary | ICD-10-CM | POA: Diagnosis not present

## 2014-07-24 DIAGNOSIS — K219 Gastro-esophageal reflux disease without esophagitis: Secondary | ICD-10-CM | POA: Diagnosis not present

## 2014-07-25 ENCOUNTER — Ambulatory Visit
Admission: RE | Admit: 2014-07-25 | Discharge: 2014-07-25 | Disposition: A | Discharge status: Home / Self Care | Payer: Medicare Other | Source: Ambulatory Visit | Attending: Radiation Oncology | Admitting: Radiation Oncology

## 2014-07-25 DIAGNOSIS — Z86711 Personal history of pulmonary embolism: Secondary | ICD-10-CM | POA: Diagnosis not present

## 2014-07-25 DIAGNOSIS — Z7901 Long term (current) use of anticoagulants: Secondary | ICD-10-CM | POA: Diagnosis not present

## 2014-07-25 DIAGNOSIS — C9000 Multiple myeloma not having achieved remission: Secondary | ICD-10-CM | POA: Diagnosis not present

## 2014-07-25 DIAGNOSIS — Z87891 Personal history of nicotine dependence: Secondary | ICD-10-CM | POA: Diagnosis not present

## 2014-07-25 DIAGNOSIS — Z79899 Other long term (current) drug therapy: Secondary | ICD-10-CM | POA: Diagnosis not present

## 2014-07-25 DIAGNOSIS — Z86718 Personal history of other venous thrombosis and embolism: Secondary | ICD-10-CM | POA: Diagnosis not present

## 2014-07-25 DIAGNOSIS — K219 Gastro-esophageal reflux disease without esophagitis: Secondary | ICD-10-CM | POA: Diagnosis not present

## 2014-07-25 DIAGNOSIS — Z96651 Presence of right artificial knee joint: Secondary | ICD-10-CM | POA: Diagnosis not present

## 2014-07-25 DIAGNOSIS — Z510 Encounter for antineoplastic radiation therapy: Secondary | ICD-10-CM | POA: Diagnosis not present

## 2014-07-28 ENCOUNTER — Ambulatory Visit
Admission: RE | Admit: 2014-07-28 | Discharge: 2014-07-28 | Disposition: A | Discharge status: Home / Self Care | Payer: Medicare Other | Source: Ambulatory Visit | Attending: Radiation Oncology | Admitting: Radiation Oncology

## 2014-07-28 DIAGNOSIS — C9000 Multiple myeloma not having achieved remission: Secondary | ICD-10-CM | POA: Diagnosis not present

## 2014-07-28 DIAGNOSIS — Z7901 Long term (current) use of anticoagulants: Secondary | ICD-10-CM | POA: Diagnosis not present

## 2014-07-28 DIAGNOSIS — Z510 Encounter for antineoplastic radiation therapy: Secondary | ICD-10-CM | POA: Diagnosis not present

## 2014-07-28 DIAGNOSIS — Z86718 Personal history of other venous thrombosis and embolism: Secondary | ICD-10-CM | POA: Diagnosis not present

## 2014-07-28 DIAGNOSIS — Z86711 Personal history of pulmonary embolism: Secondary | ICD-10-CM | POA: Diagnosis not present

## 2014-07-28 DIAGNOSIS — Z79899 Other long term (current) drug therapy: Secondary | ICD-10-CM | POA: Diagnosis not present

## 2014-07-28 DIAGNOSIS — Z87891 Personal history of nicotine dependence: Secondary | ICD-10-CM | POA: Diagnosis not present

## 2014-07-28 DIAGNOSIS — Z96651 Presence of right artificial knee joint: Secondary | ICD-10-CM | POA: Diagnosis not present

## 2014-07-28 DIAGNOSIS — K219 Gastro-esophageal reflux disease without esophagitis: Secondary | ICD-10-CM | POA: Diagnosis not present

## 2014-07-29 ENCOUNTER — Ambulatory Visit
Admission: RE | Admit: 2014-07-29 | Discharge: 2014-07-29 | Disposition: A | Discharge status: Home / Self Care | Payer: Medicare Other | Source: Ambulatory Visit | Attending: Radiation Oncology | Admitting: Radiation Oncology

## 2014-07-29 ENCOUNTER — Encounter: Payer: Self-pay | Admitting: Radiation Oncology

## 2014-07-29 VITALS — BP 120/72 | HR 64 | Temp 97.4°F | Resp 16 | Ht 69.0 in | Wt 216.4 lb

## 2014-07-29 DIAGNOSIS — Z96651 Presence of right artificial knee joint: Secondary | ICD-10-CM | POA: Diagnosis not present

## 2014-07-29 DIAGNOSIS — Z79899 Other long term (current) drug therapy: Secondary | ICD-10-CM | POA: Diagnosis not present

## 2014-07-29 DIAGNOSIS — C9000 Multiple myeloma not having achieved remission: Secondary | ICD-10-CM | POA: Diagnosis not present

## 2014-07-29 DIAGNOSIS — K219 Gastro-esophageal reflux disease without esophagitis: Secondary | ICD-10-CM | POA: Diagnosis not present

## 2014-07-29 DIAGNOSIS — Z510 Encounter for antineoplastic radiation therapy: Secondary | ICD-10-CM | POA: Diagnosis not present

## 2014-07-29 DIAGNOSIS — Z86718 Personal history of other venous thrombosis and embolism: Secondary | ICD-10-CM | POA: Diagnosis not present

## 2014-07-29 DIAGNOSIS — Z7901 Long term (current) use of anticoagulants: Secondary | ICD-10-CM | POA: Diagnosis not present

## 2014-07-29 DIAGNOSIS — Z87891 Personal history of nicotine dependence: Secondary | ICD-10-CM | POA: Diagnosis not present

## 2014-07-29 DIAGNOSIS — Z86711 Personal history of pulmonary embolism: Secondary | ICD-10-CM | POA: Diagnosis not present

## 2014-07-29 NOTE — Progress Notes (Signed)
[name] has completed 7 fractions to his skull.  He reports his pain is much better and is rating it at a 3/10.  He is taking tylenol and ibuprofen.  He took 1 norco last night and it kept him up.  He reports having headaches and blurred vision occasionally.  He reports having occasional nausea and takes Zofran as needed.  He reports fatigue in the evenings.  His skin is slightly pink on his left scalp.  He is using biafine.

## 2014-07-29 NOTE — Progress Notes (Signed)
  Radiation Oncology         [phone] ________________________________  Name: [name] MRN: [id number]  Date: 07/29/2014  DOB: 1940/10/05  Weekly Radiation Therapy Management   ICD-9-CM ICD-10-CM    1. Multiple myeloma 203.00 C90.00     DIAGNOSIS: Stage II A multiple myeloma with skull pain   Current Dose: 17.5 Gy     Planned Dose:  25 Gy  Narrative . . . . . . . . The patient presents for routine under treatment assessment.                                   The patient has noticed good improvement in his skull pain at this time. He denies any scalp itching or irritation. He denies any hair loss. Patient has occasional mild headaches and mild blurring of his vision.                                 Set-up films were reviewed.                                 The chart was checked. Physical Findings. . .  height is $RemoveB'5\' 9"'GNbTcvYB$  (1.753 m) and weight is 216 lb 6.4 oz (98.158 kg). His oral temperature is 97.4 F (36.3 C). His blood pressure is 120/72 and his pulse is 64. His respiration is 16. . Weight essentially stable.  No significant changes. Scalp is free of any significant erythema Impression . . . . . . . The patient is tolerating radiation. Plan . . . . . . . . . . . . Continue treatment as planned.  ________________________________   Blair Promise, PhD, MD

## 2014-07-30 ENCOUNTER — Ambulatory Visit
Admission: RE | Admit: 2014-07-30 | Discharge: 2014-07-30 | Disposition: A | Discharge status: Home / Self Care | Payer: Medicare Other | Source: Ambulatory Visit | Attending: Radiation Oncology | Admitting: Radiation Oncology

## 2014-07-30 DIAGNOSIS — Z86711 Personal history of pulmonary embolism: Secondary | ICD-10-CM | POA: Diagnosis not present

## 2014-07-30 DIAGNOSIS — Z86718 Personal history of other venous thrombosis and embolism: Secondary | ICD-10-CM | POA: Diagnosis not present

## 2014-07-30 DIAGNOSIS — Z7901 Long term (current) use of anticoagulants: Secondary | ICD-10-CM | POA: Diagnosis not present

## 2014-07-30 DIAGNOSIS — K219 Gastro-esophageal reflux disease without esophagitis: Secondary | ICD-10-CM | POA: Diagnosis not present

## 2014-07-30 DIAGNOSIS — Z96651 Presence of right artificial knee joint: Secondary | ICD-10-CM | POA: Diagnosis not present

## 2014-07-30 DIAGNOSIS — Z510 Encounter for antineoplastic radiation therapy: Secondary | ICD-10-CM | POA: Diagnosis not present

## 2014-07-30 DIAGNOSIS — Z87891 Personal history of nicotine dependence: Secondary | ICD-10-CM | POA: Diagnosis not present

## 2014-07-30 DIAGNOSIS — Z79899 Other long term (current) drug therapy: Secondary | ICD-10-CM | POA: Diagnosis not present

## 2014-07-30 DIAGNOSIS — C9000 Multiple myeloma not having achieved remission: Secondary | ICD-10-CM | POA: Diagnosis not present

## 2014-07-31 ENCOUNTER — Ambulatory Visit
Admission: RE | Admit: 2014-07-31 | Discharge: 2014-07-31 | Disposition: A | Discharge status: Home / Self Care | Payer: Medicare Other | Source: Ambulatory Visit | Attending: Radiation Oncology | Admitting: Radiation Oncology

## 2014-07-31 DIAGNOSIS — Z86711 Personal history of pulmonary embolism: Secondary | ICD-10-CM | POA: Diagnosis not present

## 2014-07-31 DIAGNOSIS — Z79899 Other long term (current) drug therapy: Secondary | ICD-10-CM | POA: Diagnosis not present

## 2014-07-31 DIAGNOSIS — Z96651 Presence of right artificial knee joint: Secondary | ICD-10-CM | POA: Diagnosis not present

## 2014-07-31 DIAGNOSIS — C9000 Multiple myeloma not having achieved remission: Secondary | ICD-10-CM | POA: Diagnosis not present

## 2014-07-31 DIAGNOSIS — Z87891 Personal history of nicotine dependence: Secondary | ICD-10-CM | POA: Diagnosis not present

## 2014-07-31 DIAGNOSIS — Z7901 Long term (current) use of anticoagulants: Secondary | ICD-10-CM | POA: Diagnosis not present

## 2014-07-31 DIAGNOSIS — Z86718 Personal history of other venous thrombosis and embolism: Secondary | ICD-10-CM | POA: Diagnosis not present

## 2014-07-31 DIAGNOSIS — Z510 Encounter for antineoplastic radiation therapy: Secondary | ICD-10-CM | POA: Diagnosis not present

## 2014-07-31 DIAGNOSIS — K219 Gastro-esophageal reflux disease without esophagitis: Secondary | ICD-10-CM | POA: Diagnosis not present

## 2014-08-01 ENCOUNTER — Ambulatory Visit: Payer: Medicare Other

## 2014-08-04 ENCOUNTER — Ambulatory Visit: Payer: Medicare Other

## 2014-08-05 ENCOUNTER — Ambulatory Visit
Admission: RE | Admit: 2014-08-05 | Discharge: 2014-08-05 | Disposition: A | Discharge status: Home / Self Care | Payer: Medicare Other | Source: Ambulatory Visit | Attending: Radiation Oncology | Admitting: Radiation Oncology

## 2014-08-05 ENCOUNTER — Encounter: Payer: Self-pay | Admitting: Radiation Oncology

## 2014-08-05 VITALS — BP 130/78 | HR 76 | Temp 97.8°F | Resp 12 | Ht 69.0 in | Wt 219.1 lb

## 2014-08-05 DIAGNOSIS — C9000 Multiple myeloma not having achieved remission: Secondary | ICD-10-CM

## 2014-08-05 DIAGNOSIS — Z79899 Other long term (current) drug therapy: Secondary | ICD-10-CM | POA: Diagnosis not present

## 2014-08-05 DIAGNOSIS — K219 Gastro-esophageal reflux disease without esophagitis: Secondary | ICD-10-CM | POA: Diagnosis not present

## 2014-08-05 DIAGNOSIS — Z510 Encounter for antineoplastic radiation therapy: Secondary | ICD-10-CM | POA: Diagnosis not present

## 2014-08-05 DIAGNOSIS — Z86711 Personal history of pulmonary embolism: Secondary | ICD-10-CM | POA: Diagnosis not present

## 2014-08-05 DIAGNOSIS — Z87891 Personal history of nicotine dependence: Secondary | ICD-10-CM | POA: Diagnosis not present

## 2014-08-05 DIAGNOSIS — Z86718 Personal history of other venous thrombosis and embolism: Secondary | ICD-10-CM | POA: Diagnosis not present

## 2014-08-05 DIAGNOSIS — Z96651 Presence of right artificial knee joint: Secondary | ICD-10-CM | POA: Diagnosis not present

## 2014-08-05 DIAGNOSIS — Z7901 Long term (current) use of anticoagulants: Secondary | ICD-10-CM | POA: Diagnosis not present

## 2014-08-05 NOTE — Progress Notes (Signed)
  Radiation Oncology         [phone] ________________________________  Name: [name] MRN: [id number]  Date: 08/05/2014  DOB: [birth year]  Weekly Radiation Therapy Management  DIAGNOSIS: Stage II A multiple myeloma with skull pain  Current Dose: 25 Gy     Planned Dose:  25 Gy  Narrative . . . . . . . . The patient presents for routine under treatment assessment.                                   The patient has had significant improvement in his skull pain. He has some mild soreness along the scalp area but no hair loss or itching                                 Set-up films were reviewed.                                 The chart was checked. Physical Findings. . .  height is $RemoveB'5\' 9"'bBwBBpwC$  (1.753 m) and weight is 219 lb 1.6 oz (99.383 kg). His oral temperature is 97.8 F (36.6 C). His blood pressure is 130/78 and his pulse is 76. His respiration is 12 and oxygen saturation is 97%. . The left posterior parietal scalp area shows mild erythema, no hair loss Impression . . . . . . . The patient is tolerating radiation. Plan . . . . . . . . . . . . Routine follow-up in one month  ________________________________   Blair Promise, PhD, MD

## 2014-08-05 NOTE — Progress Notes (Signed)
[name] has completed treatment with 10 fractions to his posterior skull.  He reports his pain is almost gone in his left posterior skull.  He reports occasional nausea and is taking zofran. He reports fatigue.  His skin is pink on his scalp.  He is using biafine.  He has been given a one month follow up card.

## 2014-08-06 ENCOUNTER — Ambulatory Visit (INDEPENDENT_AMBULATORY_CARE_PROVIDER_SITE_OTHER): Payer: Medicare Other | Admitting: Family Medicine

## 2014-08-06 ENCOUNTER — Encounter: Payer: Self-pay | Admitting: Family Medicine

## 2014-08-06 VITALS — BP 138/82 | HR 68 | Ht 69.0 in | Wt 210.0 lb

## 2014-08-06 DIAGNOSIS — Z23 Encounter for immunization: Secondary | ICD-10-CM

## 2014-08-06 DIAGNOSIS — Z125 Encounter for screening for malignant neoplasm of prostate: Secondary | ICD-10-CM

## 2014-08-06 DIAGNOSIS — Z13220 Encounter for screening for lipoid disorders: Secondary | ICD-10-CM | POA: Diagnosis not present

## 2014-08-06 DIAGNOSIS — C9000 Multiple myeloma not having achieved remission: Secondary | ICD-10-CM

## 2014-08-06 DIAGNOSIS — Z1329 Encounter for screening for other suspected endocrine disorder: Secondary | ICD-10-CM

## 2014-08-06 DIAGNOSIS — Z131 Encounter for screening for diabetes mellitus: Secondary | ICD-10-CM | POA: Diagnosis not present

## 2014-08-06 NOTE — Patient Instructions (Signed)
Check with [facility] re: when pneumonia vaccine is needed (?2 year mark may be right when you are returning for your physical). Verify if they prefer pneumovax (PPSV23) vs Prevnar-13.

## 2014-08-06 NOTE — Progress Notes (Signed)
Chief Complaint  Patient presents with  . Follow-up    follow up from cancer center. Need immunizations. Should he get MMR in 6 months? Form states give only to patients not on immunosuppressive therapy.    Patient presents for additional immunizations (per protocol given at [facility]).  He currently denies any complaints.   Since his last visit here, he went on vacation to [place], where he then developed severe headache. He saw his oncologist upon his return, had CT scan which showed lytic lesions consistent with multiple myeloma.  He finished his course of 10 radiation treatments yesterday.  He only has minimal headache now.  PMH, PSH, SH, x-rays and labs reviewed.  Meds and allergies reviewed (unchanged).  ROS:  No fevers, chills, URI symptoms, chest pain, leg swelling, GI complaints, depression/anxiety or any other concerns except as per HPI.  PHYSICAL EXAM: BP 138/82 mmHg  Pulse 68  Ht $R'5\' 9"'[place]$  (1.753 m)  Wt 210 lb (95.255 kg)  BMI 31.00 kg/m2 Well developed, pleasant male in good spirits. Neuro: alert and oriented.  Cranial nerves intact, normal strength, gait Psych: normal mood, affect, hygiene and grooming  ASSESSMENT/PLAN:  Need for pneumococcal vaccine - Plan: Pneumococcal conjugate vaccine 13-valent  Need for vaccination with Pediarix - Plan: DTaP HepB IPV combined vaccine IM  Need for Hib and hepatitis B vaccination - Plan: HiB PRP-OMP conjugate vaccine 3 dose IM  Multiple myeloma - likely in early relapse as head CT showed subtle lucent areas within the skull suggestive of progressive multiple myeloma. s/p radiation; HA improved   Return in 6 months for CPE--fasting labs prior (glucose, lipid, TSH, PSA; has CBC and c-mets done regularly through oncologist)  Check with [facility] re: when pneumonia vaccine is needed (?2 year mark may be right when you are returning for your physical). It should be pneumovax at that time, since he has just received Prevnar  series. Will need to see if he is on immunosuppressant meds at that time (and if so, not a candidate for live virus vaccine such as MMR or varicella)

## 2014-08-07 ENCOUNTER — Encounter: Payer: Self-pay | Admitting: Radiation Oncology

## 2014-08-07 NOTE — Progress Notes (Signed)
  Radiation Oncology         [phone] ________________________________  Name: [name] MRN: [id number]  Date: 08/07/2014  DOB: 1941-06-07  End of Treatment Note  Diagnosis: Stage II A multiple myeloma with skull pain      Indication for treatment:  Left parietal occipital skull pain       Radiation treatment dates:   January 11 through January 26  Site/dose:   Left occipital parietal skull, 25 gray in an fractions  Beams/energy:   2 field photon beam arrangement, 6 MV photons  Narrative: The patient tolerated radiation treatment relatively well.   Towards the end of his treatment he did have significant improvement in his skull pain.  Plan: The patient has completed radiation treatment. The patient will return to radiation oncology clinic for routine followup in one month. I advised them to call or return sooner if they have any questions or concerns related to their recovery or treatment.  -----------------------------------  Blair Promise, PhD, MD

## 2014-08-11 ENCOUNTER — Telehealth: Payer: Self-pay | Admitting: Oncology

## 2014-08-11 NOTE — Telephone Encounter (Signed)
[name] called and said he starting to loose hair on the left side of his head and also that the skin is red and burns a little.  He is wondering if it is OK to apply biafine to this area.  Advised him that it is fine to use biafine as it will help to sooth the burning. Also advised him that it should start getting better in about a week.  [name] verbalized understanding.

## 2014-08-12 ENCOUNTER — Other Ambulatory Visit: Payer: Self-pay | Admitting: Medical Oncology

## 2014-08-12 DIAGNOSIS — C9000 Multiple myeloma not having achieved remission: Secondary | ICD-10-CM

## 2014-08-12 MED ORDER — LENALIDOMIDE 15 MG PO CAPS: 15.0000 mg | ORAL_CAPSULE | Freq: Every day | ORAL | Status: DC

## 2014-08-13 ENCOUNTER — Ambulatory Visit (HOSPITAL_BASED_OUTPATIENT_CLINIC_OR_DEPARTMENT_OTHER): Payer: Medicare Other

## 2014-08-13 ENCOUNTER — Telehealth: Payer: Self-pay | Admitting: Internal Medicine

## 2014-08-13 ENCOUNTER — Other Ambulatory Visit (HOSPITAL_BASED_OUTPATIENT_CLINIC_OR_DEPARTMENT_OTHER): Payer: Medicare Other

## 2014-08-13 ENCOUNTER — Encounter: Payer: Self-pay | Admitting: Physician Assistant

## 2014-08-13 ENCOUNTER — Ambulatory Visit (HOSPITAL_BASED_OUTPATIENT_CLINIC_OR_DEPARTMENT_OTHER): Payer: Medicare Other | Admitting: Physician Assistant

## 2014-08-13 ENCOUNTER — Ambulatory Visit: Payer: Medicare Other

## 2014-08-13 VITALS — BP 128/70 | HR 61 | Temp 98.2°F | Resp 18 | Ht 69.0 in | Wt 217.7 lb

## 2014-08-13 DIAGNOSIS — C9000 Multiple myeloma not having achieved remission: Secondary | ICD-10-CM | POA: Diagnosis not present

## 2014-08-13 DIAGNOSIS — I2699 Other pulmonary embolism without acute cor pulmonale: Secondary | ICD-10-CM | POA: Diagnosis not present

## 2014-08-13 LAB — CBC WITH DIFFERENTIAL/PLATELET
BASO%: 6 % — AB (ref 0.0–2.0)
BASOS ABS: 0.2 10*3/uL — AB (ref 0.0–0.1)
EOS ABS: 0.3 10*3/uL (ref 0.0–0.5)
EOS%: 10.7 % — ABNORMAL HIGH (ref 0.0–7.0)
HEMATOCRIT: 38.9 % (ref 38.4–49.9)
HGB: 13.2 g/dL (ref 13.0–17.1)
LYMPH%: 22.3 % (ref 14.0–49.0)
MCH: 32 pg (ref 27.2–33.4)
MCHC: 33.9 g/dL (ref 32.0–36.0)
MCV: 94.2 fL (ref 79.3–98.0)
MONO#: 0.3 10*3/uL (ref 0.1–0.9)
MONO%: 10.4 % (ref 0.0–14.0)
NEUT#: 1.6 10*3/uL (ref 1.5–6.5)
NEUT%: 50.6 % (ref 39.0–75.0)
PLATELETS: 133 10*3/uL — AB (ref 140–400)
RBC: 4.13 10*6/uL — AB (ref 4.20–5.82)
RDW: 14.8 % — ABNORMAL HIGH (ref 11.0–14.6)
WBC: 3.2 10*3/uL — AB (ref 4.0–10.3)
lymph#: 0.7 10*3/uL — ABNORMAL LOW (ref 0.9–3.3)
nRBC: 0 % (ref 0–0)

## 2014-08-13 LAB — COMPREHENSIVE METABOLIC PANEL (CC13)
ALK PHOS: 51 U/L (ref 40–150)
ALT: 21 U/L (ref 0–55)
AST: 18 U/L (ref 5–34)
Albumin: 3.4 g/dL — ABNORMAL LOW (ref 3.5–5.0)
Anion Gap: 8 mEq/L (ref 3–11)
BILIRUBIN TOTAL: 0.53 mg/dL (ref 0.20–1.20)
BUN: 12.1 mg/dL (ref 7.0–26.0)
CALCIUM: 8.3 mg/dL — AB (ref 8.4–10.4)
CO2: 24 mEq/L (ref 22–29)
Chloride: 108 mEq/L (ref 98–109)
Creatinine: 0.9 mg/dL (ref 0.7–1.3)
EGFR: 83 mL/min/{1.73_m2} — AB (ref >90)
GLUCOSE: 104 mg/dL (ref 70–140)
Potassium: 3.8 mEq/L (ref 3.5–5.1)
Sodium: 140 mEq/L (ref 136–145)
TOTAL PROTEIN: 6.2 g/dL — AB (ref 6.4–8.3)

## 2014-08-13 LAB — LACTATE DEHYDROGENASE (CC13): LDH: 121 U/L — AB (ref 125–245)

## 2014-08-13 MED ORDER — SODIUM CHLORIDE 0.9 % IJ SOLN
3.0000 mL | Freq: Once | INTRAMUSCULAR | Status: DC | PRN
  Filled 2014-08-13: qty 10

## 2014-08-13 MED ORDER — ZOLEDRONIC ACID 4 MG/100ML IV SOLN
4.0000 mg | Freq: Once | INTRAVENOUS | Status: AC
  Administered 2014-08-13: 4 mg via INTRAVENOUS
  Filled 2014-08-13: qty 100

## 2014-08-13 MED ORDER — HYDROCODONE-ACETAMINOPHEN 7.5-325 MG PO TABS: 1.0000 | ORAL_TABLET | Freq: Four times a day (QID) | ORAL | Status: DC | PRN

## 2014-08-13 MED ORDER — TEMAZEPAM 15 MG PO CAPS: 15.0000 mg | ORAL_CAPSULE | Freq: Every evening | ORAL | Status: DC | PRN

## 2014-08-13 MED ORDER — HEPARIN SOD (PORK) LOCK FLUSH 100 UNIT/ML IV SOLN
250.0000 [IU] | Freq: Once | INTRAVENOUS | Status: DC | PRN
  Filled 2014-08-13: qty 5

## 2014-08-13 NOTE — Patient Instructions (Signed)
Proceed with her Zometa infusion today as scheduled Continue Revlimid at the current dose Follow-up in one month Follow-up with Dr. Sondra Come as previously scheduled

## 2014-08-13 NOTE — Telephone Encounter (Signed)
Pt confirmed labs/ov per 02/03 POF, gave pt AVS.... KJ °

## 2014-08-13 NOTE — Progress Notes (Addendum)
Gas [phone]   Fax:([phone] [place], [place] [address]  DIAGNOSIS: Stage IIA multiple myeloma diagnosed in July of 2013.   PRIOR THERAPY:  1) Systemic chemotherapy with subcutaneous Velcade 1.3 mg/M2 on days 1, 4, 8 and 11 every 3 weeks in addition to Revlimid 25 mg by mouth daily for 14 days every 3 weeks and dexamethasone 40 mg on a weekly basis. Status post 4 planned cycles.  2) Systemic chemotherapy with subcutaneous Velcade 1.3 mg/M2 on days 1, 4, 8 and 11 every 3 weeks in addition to Revlimid 25 mg by mouth daily for 14 days every 3 weeks and dexamethasone 40 mg on a weekly basis. 4 more cycles are planned and he is status post 4 cycles of the second set of 4 cycles, now status post a total of 10 cycles. 3) Systemic chemotherapy with Carfilzomib 36 mg/M2 on days 1, 2, 8, 9, 15 and 16 in addition to cyclophosphamide 300 mg/M2 IV on days 1, 8 and 15 as well as dexamethasone on days 1, 8, 15 and 22 every 4 weeks. Status post 4 cycles. 4) Zometa 4 mg IV every month. 5) autologous peripheral blood stem cell transplant on 03/20/2013 at [facility]. 6) Consolidation chemotherapy with Carfilzomib 36 mg/M2 on days 1, 2, 8, 9, 15 and 16 every 4 weeks in addition to cyclophosphamide 300 mg/M2 on days 1, 8 and 15 as well as dexamethasone 40 mg on a weekly basis every 4 weeks. Status post 2 cycles. First cycle 06/11/2013. 7) Maintenance therapy with Revlimid 10 mg by mouth daily status post 3 months of treatment. 8) palliative radiotherapy to the lytic lesion in the skull under the care of Dr. Sondra Come   CURRENT THERAPY:  1) Maintenance therapy with Revlimid 15 mg by mouth daily started 11/09/2013. 2) Zometa 4 mg IV every 2 months. 3) Xarelto 20 mg by mouth daily for pulmonary embolism.    INTERVAL HISTORY: [name] 74 y.o. male returns to the clinic today for a follow-up visit.  He reports that he completed his palliative radiation to the lytic lesions of the skull in the care of Dr. Randa Ngo last Tuesday. He continues to have a mild headache as well as tingling of his scalp. He did have some hair loss in that area. He requests refill for his Norco for pain management as well as his Restoril for his intermittent insomnia. He is due for his serum beta infusion today. He otherwise is doing well. He voiced no other specific complaints other than fatigue. He denied having any fever or chills. He has no nausea or vomiting.  He denied having any significant chest pain, shortness of breath, cough or hemoptysis.   MEDICAL HISTORY: Past Medical History  Diagnosis Date  . GERD (gastroesophageal reflux disease)   . Adenomatous polyp of colon 1996  . Arthritis     Osteoarthritis right knee, spine, wrist  . Benign prostatic hypertrophy     (Dr. Karsten Ro)  . History of chronic prostatitis   . History of melanoma     Back (Dr. Wilhemina Bonito); early melanoma R arm 03/2011  . Diverticulosis   . Arthritis of hand, right     Right thumb (Dr. Daylene Katayama)  . Degenerative disc disease   . Hemorrhoids   . Erosive esophagitis   . Back ache     r/o Multiple Myeloma  . Multiple myeloma 2013  skull lytic lesions--treated with radiation 07/2014  . DVT (deep venous thrombosis) 01/2014    R calf  . Pulmonary embolism 01/2014    ALLERGIES:  is allergic to penicillins.  MEDICATIONS:  Current Outpatient Prescriptions  Medication Sig Dispense Refill  . acetaminophen (TYLENOL) 500 MG tablet Take 1,000 mg by mouth every 8 (eight) hours as needed.    Marland Kitchen ascorbic acid (VITAMIN C) 500 MG tablet Take 500 mg by mouth daily.    . B Complex-C (B-COMPLEX WITH VITAMIN C) tablet Take 1 tablet by mouth daily.    . cholecalciferol (VITAMIN D) 1000 UNITS tablet Take 1,000 Units by mouth daily.    Marland Kitchen dicyclomine (BENTYL) 10 MG capsule Take 1 tab every 6 hours as needed for cramping, bloating, diarrhea. 90 capsule 1    . emollient (BIAFINE) cream Apply topically as needed.    . fish oil-omega-3 fatty acids 1000 MG capsule Take 1 g by mouth daily.     Marland Kitchen HYDROcodone-acetaminophen (NORCO) 7.5-325 MG per tablet Take 1 tablet by mouth every 6 (six) hours as needed for moderate pain. 30 tablet 0  . lenalidomide (REVLIMID) 15 MG capsule Take 1 capsule (15 mg total) by mouth daily. 28 capsule 0  . LORazepam (ATIVAN) 1 MG tablet Take 1 tablet (1 mg total) by mouth every 8 (eight) hours as needed for anxiety. 30 tablet 0  . methocarbamol (ROBAXIN) 500 MG tablet Take 1-2 tablets (500-1,000 mg total) by mouth every 6 (six) hours as needed for muscle spasms. 60 tablet 0  . Multiple Vitamin (MULTIVITAMIN WITH MINERALS) TABS Take 1 tablet by mouth daily.    Marland Kitchen oxyCODONE-acetaminophen (PERCOCET) 10-325 MG per tablet Take 1 tablet by mouth every 6 (six) hours as needed for pain. 60 tablet 0  . pantoprazole (PROTONIX) 40 MG tablet Take 1 tablet (40 mg total) by mouth 2 (two) times daily. Take 1 tablet (40 mg total) by mouth Two (2) times a day. 60 tablet 5  . Probiotic Product (ALIGN) 4 MG CAPS Take 1 capsule by mouth daily.    . prochlorperazine (COMPAZINE) 10 MG tablet Take 1 tablet (10 mg total) by mouth every 6 (six) hours as needed. 30 tablet 1  . temazepam (RESTORIL) 15 MG capsule Take 1 capsule (15 mg total) by mouth at bedtime as needed. 30 capsule 0  . XARELTO 20 MG TABS tablet TAKE 1 TABLET BY MOUTH DAILY WITH SUPPER (Patient taking differently: TAKE 1 TABLET BY MOUTH DAILY) 90 tablet 0  . zolendronic acid (ZOMETA) 4 MG/5ML injection Inject 4 mg into the vein every 28 (twenty-eight) days.    . ondansetron (ZOFRAN) 4 MG tablet Take 1 tab every 6 hours as needed for nausea (Patient not taking: Reported on 08/13/2014) 40 tablet 1   No current facility-administered medications for this visit.   Facility-Administered Medications Ordered in Other Visits  Medication Dose Route Frequency Provider Last Rate Last Dose  .  ondansetron (ZOFRAN) tablet 8 mg  8 mg Oral Once Si Gaul, MD      . sodium chloride 0.9 % injection 10 mL  10 mL Intracatheter PRN Si Gaul, MD   10 mL at 06/11/13 1616    SURGICAL HISTORY:  Past Surgical History  Procedure Laterality Date  . Total knee replaced  2000    Left knee  . Appendectomy    . Colonoscopy  2009    adenomatous polyp  . Esophagogastroduodenoscopy  2009    mild inflammation at esophagogastric junction  . Excision  of melanoma      back ( x3)  . Great toe surgery      bilateral  . Inguinal hernia repair      left, x 2  . Total knee arthroplasty  06/08/2011    Procedure: TOTAL KNEE ARTHROPLASTY;  Surgeon: Ninetta Lights, MD;  Location: [facility];  Service: Orthopedics;  Laterality: Right;  osteonics  . [place]  10/2012  . Bone marrow transplant  03/2013    Stem Cell    REVIEW OF SYSTEMS:  Constitutional: positive for fatigue Eyes: negative Ears, nose, mouth, throat, and face: negative Respiratory: negative Cardiovascular: negative Gastrointestinal: negative Genitourinary:negative Integument/breast: negative Hematologic/lymphatic: negative Musculoskeletal:positive for bone pain Neurological: positive for headaches and Scalp tenderness in the region where he received radiation therapy Behavioral/Psych: negative Endocrine: negative Allergic/Immunologic: negative   PHYSICAL EXAMINATION: General appearance: alert, cooperative and no distress Head: Normocephalic, without obvious abnormality, Erythema and thinning hair in the left parietal occipital region Neck: no adenopathy, no JVD, supple, symmetrical, trachea midline and thyroid not enlarged, symmetric, no tenderness/mass/nodules Lymph nodes: Cervical, supraclavicular, and axillary nodes normal. Resp: clear to auscultation bilaterally Back: symmetric, no curvature. ROM normal. No CVA tenderness. Cardio: regular rate and rhythm, S1, S2 normal, no murmur, click, rub or gallop GI: soft,  non-tender; bowel sounds normal; no masses,  no organomegaly Extremities: extremities normal, atraumatic, no cyanosis or edema Neurologic: Alert and oriented X 3, normal strength and tone. Normal symmetric reflexes. Normal coordination and gait  ECOG PERFORMANCE STATUS: 1 - Symptomatic but completely ambulatory  Blood pressure 128/70, pulse 61, temperature 98.2 F (36.8 C), temperature source Oral, resp. rate 18, height $RemoveBe'5\' 9"'pVoRaqjgM$  (1.753 m), weight 217 lb 11.2 oz (98.748 kg), SpO2 98 %.  LABORATORY DATA: Lab Results  Component Value Date   WBC 3.2* 08/13/2014   HGB 13.2 08/13/2014   HCT 38.9 08/13/2014   MCV 94.2 08/13/2014   PLT 133* 08/13/2014      Chemistry      Component Value Date/Time   NA 140 08/13/2014 0803   NA 139 04/22/2014 1417   K 3.8 08/13/2014 0803   K 3.9 04/22/2014 1417   CL 107 04/22/2014 1417   CL 109* 12/31/2012 0759   CO2 24 08/13/2014 0803   CO2 22 04/22/2014 1417   BUN 12.1 08/13/2014 0803   BUN 10 04/22/2014 1417   CREATININE 0.9 08/13/2014 0803   CREATININE 1.1 04/22/2014 1417   CREATININE 0.97 11/18/2013 1045      Component Value Date/Time   CALCIUM 8.3* 08/13/2014 0803   CALCIUM 8.8 04/22/2014 1417   ALKPHOS 51 08/13/2014 0803   ALKPHOS 47 04/22/2014 1417   AST 18 08/13/2014 0803   AST 18 04/22/2014 1417   ALT 21 08/13/2014 0803   ALT 22 04/22/2014 1417   BILITOT 0.53 08/13/2014 0803   BILITOT 0.8 04/22/2014 1417     Myeloma panel: Beta-2 microglobulin 2.83, free kappa light chain 3.12, free lambda light chain 2.65 and kappa/lambda ratio 1.18. IgG 1090, IgA 180 and IgM 30.  RADIOGRAPHIC STUDIES: No results found.  ASSESSMENT AND PLAN: This is a very pleasant 74 years old white male with history of multiple myeloma status post induction chemotherapy with Velcade, Revlimid and dexamethasone followed by treatment with Carfilzomib, cyclophosphamide and dexamethasone with significant improvement in his disease followed by autologous peripheral  blood stem cell transplant at [facility] on 03/20/2013. This was followed by 2 cycles of consolidation chemotherapy with Carfilzomib, Cytoxan and dexamethasone, and he  tolerated it fairly well. He completed maintenance Revlimid 10 mg by mouth daily for 3 months and tolerating it fairly well. He started maintenance Revlimid with 15 mg by mouth daily in May 2015 and is tolerating it well. His recent myeloma panel showed no evidence for disease progression. Patient was discussed with and also seen by Dr. [name]. He will continue his current treatment with maintenance Revlimid. He completed the palliative radiation to the lytic lesions under the care of Dr. Sondra Come He will continue treatment with Zometa every 2 months. He will receive Zometa today For the pulmonary embolism, he will continue on Xarelto 20 mg by mouth daily as scheduled. He would come back for follow-up visit in one month's for reevaluation with repeat blood work. He was given refill prescriptions for his Norco and Restoril. He was advised to call immediately if he has any concerning symptoms in the interval. The patient voices understanding of current disease status and treatment options and is in agreement with the current care plan.  All questions were answered. The patient knows to call the clinic with any problems, questions or concerns. We can certainly see the patient much sooner if necessary.  Carlton Adam, PA-C 08/13/2014  ADDENDUM: Hematology/Oncology Attending: I had a face to face encounter with the patient today. I recommended his care plan. This is a very pleasant 74 years old white male with multiple myeloma currently on maintenance chemotherapy with Revlimid 15 mg by mouth daily and tolerating it fairly well. His most recent myeloma panel showed no evidence for disease progression. The patient recently completed a course of palliative radiotherapy to the lytic lesion on the left side of his skull. The  headache and pain in that area had significantly improved. I recommended for him to continue with his current maintenance chemotherapy as scheduled. He would come back for follow-up visit in one month for evaluation. He will continue his current treatment with Zometa and Xarelto as a scheduled. The patient was advised to call immediately if he has any concerning symptoms in the interval.  Disclaimer: This note was dictated with voice recognition software. Similar sounding words can inadvertently be transcribed and may not be corrected upon review. Eilleen Kempf., MD 08/13/2014

## 2014-08-13 NOTE — Patient Instructions (Signed)

## 2014-08-14 ENCOUNTER — Telehealth: Payer: Self-pay | Admitting: Medical Oncology

## 2014-08-14 NOTE — Telephone Encounter (Signed)
Faxed revlimid to [facility]

## 2014-08-19 ENCOUNTER — Other Ambulatory Visit: Payer: Self-pay | Admitting: Pharmacist

## 2014-09-04 ENCOUNTER — Other Ambulatory Visit: Payer: Self-pay | Admitting: Medical Oncology

## 2014-09-04 ENCOUNTER — Encounter: Payer: Self-pay | Admitting: Gastroenterology

## 2014-09-04 DIAGNOSIS — C9000 Multiple myeloma not having achieved remission: Secondary | ICD-10-CM

## 2014-09-04 MED ORDER — LENALIDOMIDE 15 MG PO CAPS: 15.0000 mg | ORAL_CAPSULE | Freq: Every day | ORAL | Status: DC

## 2014-09-08 ENCOUNTER — Encounter: Payer: Self-pay | Admitting: Oncology

## 2014-09-08 ENCOUNTER — Ambulatory Visit
Admission: RE | Admit: 2014-09-08 | Discharge: 2014-09-08 | Disposition: A | Discharge status: Home / Self Care | Payer: Medicare Other | Source: Ambulatory Visit | Attending: Radiation Oncology | Admitting: Radiation Oncology

## 2014-09-08 VITALS — BP 126/71 | HR 65 | Temp 98.1°F | Resp 16 | Ht 69.0 in | Wt 215.9 lb

## 2014-09-08 DIAGNOSIS — C9000 Multiple myeloma not having achieved remission: Secondary | ICD-10-CM

## 2014-09-08 HISTORY — DX: Reserved for concepts with insufficient information to code with codable children: IMO0002

## 2014-09-08 HISTORY — DX: Reserved for inherently not codable concepts without codable children: IMO0001

## 2014-09-08 NOTE — Progress Notes (Signed)
[name] here for follow up after treatment to his left occipital parietal skull.  He reports occasional pain on the upper back portion of his left skull.  He takes tylenol twice a day and periodically, hydrocodone.  He continues to take revlimid.  He denies vision changes, memory issues and balance issues.  He reports a good appetite and just got back from an 11 day cruise.  His energy level is back to normal and he is walking an hour a day.  He has lost the hair on his left skull.  His skin is intact.  BP 126/71 mmHg  Pulse 65  Temp(Src) 98.1 F (36.7 C) (Oral)  Resp 16  Ht 5\' 9"  (1.753 m)  Wt 215 lb 14.4 oz (97.932 kg)  BMI 31.87 kg/m2  SpO2 97%

## 2014-09-08 NOTE — Progress Notes (Signed)
Radiation Oncology         [phone]) [phone] ________________________________  Name: [name] MRN: [id number]  Date: 09/08/2014  DOB: Nov 29, 1940  Follow-Up Visit Note  CC: [name], MD  Curt Bears, MD    ICD-9-CM ICD-10-CM   1. Multiple myeloma 203.00 C90.00     Diagnosis:   Stage II A multiple myeloma with skull pain   Interval Since Last Radiation:  1  months  Narrative:  The patient returns today for routine follow-up.  He is doing well at this time. He occasionally will notice a twinge of pain in his left occipital scalp but no consistent pain at this time. Patient recently returned from a 11 day cruise and enjoyed this trip. Patient is on Revlimid for management of his multiple myeloma and tolerating well                              ALLERGIES:  is allergic to penicillins.  Meds: Current Outpatient Prescriptions  Medication Sig Dispense Refill  . acetaminophen (TYLENOL) 500 MG tablet Take 1,000 mg by mouth every 8 (eight) hours as needed.    Marland Kitchen ascorbic acid (VITAMIN C) 500 MG tablet Take 500 mg by mouth daily.    . B Complex-C (B-COMPLEX WITH VITAMIN C) tablet Take 1 tablet by mouth daily.    . cholecalciferol (VITAMIN D) 1000 UNITS tablet Take 1,000 Units by mouth daily.    Marland Kitchen dicyclomine (BENTYL) 10 MG capsule Take 1 tab every 6 hours as needed for cramping, bloating, diarrhea. 90 capsule 1  . fish oil-omega-3 fatty acids 1000 MG capsule Take 1 g by mouth daily.     Marland Kitchen HYDROcodone-acetaminophen (NORCO) 7.5-325 MG per tablet Take 1 tablet by mouth every 6 (six) hours as needed for moderate pain. 30 tablet 0  . lenalidomide (REVLIMID) 15 MG capsule Take 1 capsule (15 mg total) by mouth daily. 28 capsule 0  . LORazepam (ATIVAN) 1 MG tablet Take 1 tablet (1 mg total) by mouth every 8 (eight) hours as needed for anxiety. 30 tablet 0  . methocarbamol (ROBAXIN) 500 MG tablet Take 1-2 tablets (500-1,000 mg total) by mouth every 6 (six) hours as needed for muscle spasms.  60 tablet 0  . Multiple Vitamin (MULTIVITAMIN WITH MINERALS) TABS Take 1 tablet by mouth daily.    . pantoprazole (PROTONIX) 40 MG tablet Take 1 tablet (40 mg total) by mouth 2 (two) times daily. Take 1 tablet (40 mg total) by mouth Two (2) times a day. 60 tablet 5  . Probiotic Product (ALIGN) 4 MG CAPS Take 1 capsule by mouth daily.    . prochlorperazine (COMPAZINE) 10 MG tablet Take 1 tablet (10 mg total) by mouth every 6 (six) hours as needed. 30 tablet 1  . temazepam (RESTORIL) 15 MG capsule Take 1 capsule (15 mg total) by mouth at bedtime as needed. 30 capsule 0  . XARELTO 20 MG TABS tablet TAKE 1 TABLET BY MOUTH DAILY WITH SUPPER (Patient taking differently: TAKE 1 TABLET BY MOUTH DAILY) 90 tablet 0  . zolendronic acid (ZOMETA) 4 MG/5ML injection Inject 4 mg into the vein every 28 (twenty-eight) days.    Marland Kitchen emollient (BIAFINE) cream Apply topically as needed.    . ondansetron (ZOFRAN) 4 MG tablet Take 1 tab every 6 hours as needed for nausea (Patient not taking: Reported on 08/13/2014) 40 tablet 1  . oxyCODONE-acetaminophen (PERCOCET) 10-325 MG per tablet Take 1 tablet  by mouth every 6 (six) hours as needed for pain. (Patient not taking: Reported on 09/08/2014) 60 tablet 0   No current facility-administered medications for this encounter.   Facility-Administered Medications Ordered in Other Encounters  Medication Dose Route Frequency Provider Last Rate Last Dose  . ondansetron (ZOFRAN) tablet 8 mg  8 mg Oral Once Curt Bears, MD        Physical Findings: The patient is in no acute distress. Patient is alert and oriented.  height is $RemoveB'5\' 9"'aTexynzX$  (1.753 m) and weight is 215 lb 14.4 oz (97.932 kg). His oral temperature is 98.1 F (36.7 C). His blood pressure is 126/71 and his pulse is 65. His respiration is 16 and oxygen saturation is 97%. .  The patient has alopecia in the radiation portal of the left parietal occipital scalp region,  no significant erythema or skin breakdown.  Lab  Findings: Lab Results  Component Value Date   WBC 3.2* 08/13/2014   HGB 13.2 08/13/2014   HCT 38.9 08/13/2014   MCV 94.2 08/13/2014   PLT 133* 08/13/2014    Radiographic Findings: No results found.  Impression:  Multiple myeloma. The patient has received significant improvement in his scalp/skull pain.  Plan:  When necessary follow-up in radiation oncology. The patient will continue close follow-up in medical oncology with ongoing treatment  ____________________________________ Blair Promise, MD

## 2014-09-10 ENCOUNTER — Telehealth: Payer: Self-pay | Admitting: Internal Medicine

## 2014-09-10 ENCOUNTER — Other Ambulatory Visit: Payer: Self-pay | Admitting: *Deleted

## 2014-09-10 ENCOUNTER — Ambulatory Visit (HOSPITAL_BASED_OUTPATIENT_CLINIC_OR_DEPARTMENT_OTHER): Payer: Medicare Other | Admitting: Internal Medicine

## 2014-09-10 ENCOUNTER — Other Ambulatory Visit: Payer: Medicare Other

## 2014-09-10 ENCOUNTER — Encounter: Payer: Self-pay | Admitting: Internal Medicine

## 2014-09-10 ENCOUNTER — Telehealth: Payer: Self-pay | Admitting: *Deleted

## 2014-09-10 VITALS — BP 121/67 | HR 72 | Temp 97.6°F | Resp 18 | Ht 69.0 in | Wt 214.1 lb

## 2014-09-10 DIAGNOSIS — C9000 Multiple myeloma not having achieved remission: Secondary | ICD-10-CM

## 2014-09-10 DIAGNOSIS — J209 Acute bronchitis, unspecified: Secondary | ICD-10-CM

## 2014-09-10 DIAGNOSIS — Z9484 Stem cells transplant status: Secondary | ICD-10-CM

## 2014-09-10 DIAGNOSIS — I2699 Other pulmonary embolism without acute cor pulmonale: Secondary | ICD-10-CM | POA: Diagnosis not present

## 2014-09-10 DIAGNOSIS — R21 Rash and other nonspecific skin eruption: Secondary | ICD-10-CM

## 2014-09-10 LAB — CBC WITH DIFFERENTIAL/PLATELET
BASO%: 4.1 % — AB (ref 0.0–2.0)
Basophils Absolute: 0.1 10*3/uL (ref 0.0–0.1)
EOS%: 4.7 % (ref 0.0–7.0)
Eosinophils Absolute: 0.2 10*3/uL (ref 0.0–0.5)
HCT: 40.6 % (ref 38.4–49.9)
HGB: 13.6 g/dL (ref 13.0–17.1)
LYMPH%: 17.7 % (ref 14.0–49.0)
MCH: 31.7 pg (ref 27.2–33.4)
MCHC: 33.5 g/dL (ref 32.0–36.0)
MCV: 94.6 fL (ref 79.3–98.0)
MONO#: 0.4 10*3/uL (ref 0.1–0.9)
MONO%: 12.2 % (ref 0.0–14.0)
NEUT#: 2.1 10*3/uL (ref 1.5–6.5)
NEUT%: 61.3 % (ref 39.0–75.0)
PLATELETS: 106 10*3/uL — AB (ref 140–400)
RBC: 4.29 10*6/uL (ref 4.20–5.82)
RDW: 14.3 % (ref 11.0–14.6)
WBC: 3.4 10*3/uL — ABNORMAL LOW (ref 4.0–10.3)
lymph#: 0.6 10*3/uL — ABNORMAL LOW (ref 0.9–3.3)

## 2014-09-10 LAB — COMPREHENSIVE METABOLIC PANEL (CC13)
ALT: 48 U/L (ref 0–55)
AST: 50 U/L — AB (ref 5–34)
Albumin: 3.3 g/dL — ABNORMAL LOW (ref 3.5–5.0)
Alkaline Phosphatase: 65 U/L (ref 40–150)
Anion Gap: 10 mEq/L (ref 3–11)
BILIRUBIN TOTAL: 0.85 mg/dL (ref 0.20–1.20)
BUN: 10.5 mg/dL (ref 7.0–26.0)
CHLORIDE: 107 meq/L (ref 98–109)
CO2: 23 mEq/L (ref 22–29)
CREATININE: 1 mg/dL (ref 0.7–1.3)
Calcium: 9.1 mg/dL (ref 8.4–10.4)
EGFR: 71 mL/min/{1.73_m2} — ABNORMAL LOW (ref >90)
Glucose: 102 mg/dl (ref 70–140)
Potassium: 3.6 mEq/L (ref 3.5–5.1)
Sodium: 140 mEq/L (ref 136–145)
Total Protein: 6.4 g/dL (ref 6.4–8.3)

## 2014-09-10 LAB — LACTATE DEHYDROGENASE (CC13): LDH: 150 U/L (ref 125–245)

## 2014-09-10 MED ORDER — METHYLPREDNISOLONE (PAK) 4 MG PO TABS: ORAL_TABLET | ORAL | Status: DC

## 2014-09-10 MED ORDER — AZITHROMYCIN 250 MG PO TABS: ORAL_TABLET | ORAL | Status: DC

## 2014-09-10 NOTE — Telephone Encounter (Signed)
gv and printed appt sched and avs for pt for March and April

## 2014-09-10 NOTE — Progress Notes (Signed)
[facility] [phone]) [phone]   Fax:([phone]) [place], [place] [address]  DIAGNOSIS: Stage IIA multiple myeloma diagnosed in July of 2013.   PRIOR THERAPY:  1) Systemic chemotherapy with subcutaneous Velcade 1.3 mg/M2 on days 1, 4, 8 and 11 every 3 weeks in addition to Revlimid 25 mg by mouth daily for 14 days every 3 weeks and dexamethasone 40 mg on a weekly basis. Status post 4 planned cycles.  2) Systemic chemotherapy with subcutaneous Velcade 1.3 mg/M2 on days 1, 4, 8 and 11 every 3 weeks in addition to Revlimid 25 mg by mouth daily for 14 days every 3 weeks and dexamethasone 40 mg on a weekly basis. 4 more cycles are planned and he is status post 4 cycles of the second set of 4 cycles, now status post a total of 10 cycles. 3) Systemic chemotherapy with Carfilzomib 36 mg/M2 on days 1, 2, 8, 9, 15 and 16 in addition to cyclophosphamide 300 mg/M2 IV on days 1, 8 and 15 as well as dexamethasone on days 1, 8, 15 and 22 every 4 weeks. Status post 4 cycles. 4) Zometa 4 mg IV every month. 5) autologous peripheral blood stem cell transplant on 03/20/2013 at [facility]. 6) Consolidation chemotherapy with Carfilzomib 36 mg/M2 on days 1, 2, 8, 9, 15 and 16 every 4 weeks in addition to cyclophosphamide 300 mg/M2 on days 1, 8 and 15 as well as dexamethasone 40 mg on a weekly basis every 4 weeks. Status post 2 cycles. First cycle 06/11/2013. 7) Maintenance therapy with Revlimid 10 mg by mouth daily status post 3 months of treatment.   CURRENT THERAPY:  1) Maintenance therapy with Revlimid 15 mg by mouth daily started 11/09/2013. 2) Zometa 4 mg IV every 2 months. 3) Xarelto 20 mg by mouth daily for pulmonary embolism. 4) palliative radiotherapy to the lytic lesion in the skull under the care of Dr. Sondra Come   INTERVAL HISTORY: [name] 74 y.o. male returns to the clinic today for routine followup  visit. The patient just came back from a vacation and close to the [place]. His feeling fine except for chest congestion started few days ago with mild cough. He also has some skin rash during his vacation improved with Medrol Dosepak. He denied having any significant fatigue or weakness. He denied having any fever or chills. He has no nausea or vomiting.  He denied having any significant chest pain, shortness of breath, cough or hemoptysis. He is tolerating his treatment with Revlimid fairly well.  MEDICAL HISTORY: Past Medical History  Diagnosis Date  . GERD (gastroesophageal reflux disease)   . Adenomatous polyp of colon 1996  . Arthritis     Osteoarthritis right knee, spine, wrist  . Benign prostatic hypertrophy     (Dr. Karsten Ro)  . History of chronic prostatitis   . History of melanoma     Back (Dr. Wilhemina Bonito); early melanoma R arm 03/2011  . Diverticulosis   . Arthritis of hand, right     Right thumb (Dr. Daylene Katayama)  . Degenerative disc disease   . Hemorrhoids   . Erosive esophagitis   . Back ache     r/o Multiple Myeloma  . Multiple myeloma 2013    skull lytic lesions--treated with radiation 07/2014  . DVT (deep venous thrombosis) 01/2014    R calf  . Pulmonary embolism 01/2014  . Radiation 07/21/14-08/05/14  left occipital parietal skull 25 gray    ALLERGIES:  is allergic to penicillins.  MEDICATIONS:  Current Outpatient Prescriptions  Medication Sig Dispense Refill  . acetaminophen (TYLENOL) 500 MG tablet Take 1,000 mg by mouth every 8 (eight) hours as needed.    Marland Kitchen ascorbic acid (VITAMIN C) 500 MG tablet Take 500 mg by mouth daily.    . B Complex-C (B-COMPLEX WITH VITAMIN C) tablet Take 1 tablet by mouth daily.    . cholecalciferol (VITAMIN D) 1000 UNITS tablet Take 1,000 Units by mouth daily.    Marland Kitchen dicyclomine (BENTYL) 10 MG capsule Take 1 tab every 6 hours as needed for cramping, bloating, diarrhea. 90 capsule 1  . emollient (BIAFINE) cream Apply topically as needed.     . fish oil-omega-3 fatty acids 1000 MG capsule Take 1 g by mouth daily.     Marland Kitchen HYDROcodone-acetaminophen (NORCO) 7.5-325 MG per tablet Take 1 tablet by mouth every 6 (six) hours as needed for moderate pain. 30 tablet 0  . lenalidomide (REVLIMID) 15 MG capsule Take 1 capsule (15 mg total) by mouth daily. 28 capsule 0  . LORazepam (ATIVAN) 1 MG tablet Take 1 tablet (1 mg total) by mouth every 8 (eight) hours as needed for anxiety. 30 tablet 0  . methocarbamol (ROBAXIN) 500 MG tablet Take 1-2 tablets (500-1,000 mg total) by mouth every 6 (six) hours as needed for muscle spasms. 60 tablet 0  . Multiple Vitamin (MULTIVITAMIN WITH MINERALS) TABS Take 1 tablet by mouth daily.    . ondansetron (ZOFRAN) 4 MG tablet Take 1 tab every 6 hours as needed for nausea (Patient not taking: Reported on 08/13/2014) 40 tablet 1  . oxyCODONE-acetaminophen (PERCOCET) 10-325 MG per tablet Take 1 tablet by mouth every 6 (six) hours as needed for pain. (Patient not taking: Reported on 09/08/2014) 60 tablet 0  . pantoprazole (PROTONIX) 40 MG tablet Take 1 tablet (40 mg total) by mouth 2 (two) times daily. Take 1 tablet (40 mg total) by mouth Two (2) times a day. 60 tablet 5  . Probiotic Product (ALIGN) 4 MG CAPS Take 1 capsule by mouth daily.    . prochlorperazine (COMPAZINE) 10 MG tablet Take 1 tablet (10 mg total) by mouth every 6 (six) hours as needed. 30 tablet 1  . temazepam (RESTORIL) 15 MG capsule Take 1 capsule (15 mg total) by mouth at bedtime as needed. 30 capsule 0  . XARELTO 20 MG TABS tablet TAKE 1 TABLET BY MOUTH DAILY WITH SUPPER (Patient taking differently: TAKE 1 TABLET BY MOUTH DAILY) 90 tablet 0  . zolendronic acid (ZOMETA) 4 MG/5ML injection Inject 4 mg into the vein every 28 (twenty-eight) days.     No current facility-administered medications for this visit.   Facility-Administered Medications Ordered in Other Visits  Medication Dose Route Frequency Provider Last Rate Last Dose  . ondansetron (ZOFRAN)  tablet 8 mg  8 mg Oral Once Curt Bears, MD        SURGICAL HISTORY:  Past Surgical History  Procedure Laterality Date  . Total knee replaced  2000    Left knee  . Appendectomy    . Colonoscopy  2009    adenomatous polyp  . Esophagogastroduodenoscopy  2009    mild inflammation at esophagogastric junction  . Excision of melanoma      back ( x3)  . Great toe surgery      bilateral  . Inguinal hernia repair      left, x 2  .  Total knee arthroplasty  06/08/2011    Procedure: TOTAL KNEE ARTHROPLASTY;  Surgeon: Ninetta Lights, MD;  Location: [facility];  Service: Orthopedics;  Laterality: Right;  osteonics  . [place]  10/2012  . Bone marrow transplant  03/2013    Stem Cell    REVIEW OF SYSTEMS:  A comprehensive review of systems was negative except for: Constitutional: positive for fatigue Respiratory: positive for cough and sputum   PHYSICAL EXAMINATION: General appearance: alert, cooperative and no distress Head: Normocephalic, without obvious abnormality, atraumatic Neck: no adenopathy, no JVD, supple, symmetrical, trachea midline and thyroid not enlarged, symmetric, no tenderness/mass/nodules Lymph nodes: Cervical, supraclavicular, and axillary nodes normal. Resp: clear to auscultation bilaterally Back: symmetric, no curvature. ROM normal. No CVA tenderness. Cardio: regular rate and rhythm, S1, S2 normal, no murmur, click, rub or gallop GI: soft, non-tender; bowel sounds normal; no masses,  no organomegaly Extremities: extremities normal, atraumatic, no cyanosis or edema Neurologic: Alert and oriented X 3, normal strength and tone. Normal symmetric reflexes. Normal coordination and gait  ECOG PERFORMANCE STATUS: 1 - Symptomatic but completely ambulatory  Blood pressure 121/67, pulse 72, temperature 97.6 F (36.4 C), temperature source Oral, resp. rate 18, height _0  (1.753 m), weight 214 lb 1.6 oz (97.115 kg).  LABORATORY DATA: Lab Results  Component Value Date    WBC 3.4* 09/10/2014   HGB 13.6 09/10/2014   HCT 40.6 09/10/2014   MCV 94.6 09/10/2014   PLT 106* 09/10/2014      Chemistry      Component Value Date/Time   NA 140 08/13/2014 0803   NA 139 04/22/2014 1417   K 3.8 08/13/2014 0803   K 3.9 04/22/2014 1417   CL 107 04/22/2014 1417   CL 109* 12/31/2012 0759   CO2 24 08/13/2014 0803   CO2 22 04/22/2014 1417   BUN 12.1 08/13/2014 0803   BUN 10 04/22/2014 1417   CREATININE 0.9 08/13/2014 0803   CREATININE 1.1 04/22/2014 1417   CREATININE 0.97 11/18/2013 1045      Component Value Date/Time   CALCIUM 8.3* 08/13/2014 0803   CALCIUM 8.8 04/22/2014 1417   ALKPHOS 51 08/13/2014 0803   ALKPHOS 47 04/22/2014 1417   AST 18 08/13/2014 0803   AST 18 04/22/2014 1417   ALT 21 08/13/2014 0803   ALT 22 04/22/2014 1417   BILITOT 0.53 08/13/2014 0803   BILITOT 0.8 04/22/2014 1417     Myeloma panel: Beta-2 microglobulin 2.83, free kappa light chain 3.12, free lambda light chain 2.65 and kappa/lambda ratio 1.18. IgG 1090, IgA 180 and IgM 30.  RADIOGRAPHIC STUDIES: No results found.  ASSESSMENT AND PLAN: This is a very pleasant 73 years old white male with history of multiple myeloma status post induction chemotherapy with Velcade, Revlimid and dexamethasone followed by treatment with Carfilzomib, cyclophosphamide and dexamethasone with significant improvement in his disease followed by autologous peripheral blood stem cell transplant at [name] on 03/20/2013. This was followed by 2 cycles of consolidation chemotherapy with Carfilzomib, Cytoxan and dexamethasone, and he tolerated it fairly well. He completed maintenance Revlimid 10 mg by mouth daily for 3 months and tolerating it fairly well. He started maintenance Revlimid with 15 mg by mouth daily in May 2015 and tolerating it well. The patient is feeling fine today except for the chest congestion. I recommended for him to continue his current treatment with maintenance Revlimid. He would  come back for follow-up visit in one month's after repeating myeloma panel. For the acute  bronchitis, I will start the patient on Z-Pak. He was also given prescription for Medrol Dosepak to use if he develop any skin rash. He will continue treatment with Zometa every 2 months.  For the pulmonary embolism, he will continue on Xarelto 20 mg by mouth daily as scheduled. He would come back for follow-up visit in one month's for reevaluation with repeat blood work. He was advised to call immediately if he has any concerning symptoms in the interval. The patient voices understanding of current disease status and treatment options and is in agreement with the current care plan.  All questions were answered. The patient knows to call the clinic with any problems, questions or concerns. We can certainly see the patient much sooner if necessary.  Disclaimer: This note was dictated with voice recognition software. Similar sounding words can inadvertently be transcribed and may not be corrected upon review.

## 2014-09-10 NOTE — Telephone Encounter (Signed)
Called mail order pharmacy to cancel order for Z-pack and methylprednisolone.

## 2014-09-13 DIAGNOSIS — J209 Acute bronchitis, unspecified: Secondary | ICD-10-CM | POA: Diagnosis not present

## 2014-09-29 ENCOUNTER — Other Ambulatory Visit: Payer: Self-pay | Admitting: *Deleted

## 2014-09-29 DIAGNOSIS — C9000 Multiple myeloma not having achieved remission: Secondary | ICD-10-CM

## 2014-09-29 MED ORDER — LENALIDOMIDE 15 MG PO CAPS: 15.0000 mg | ORAL_CAPSULE | Freq: Every day | ORAL | Status: DC

## 2014-09-29 NOTE — Addendum Note (Signed)
Addended by: Wyonia Hough on: 09/29/2014 03:05 PM   Modules accepted: Orders

## 2014-09-29 NOTE — Telephone Encounter (Signed)
THIS REFILL REQUEST FOR REVLIMID WAS PLACED ON DR. Worthy Flank DESK.

## 2014-10-06 ENCOUNTER — Other Ambulatory Visit: Payer: Self-pay | Admitting: Medical Oncology

## 2014-10-06 ENCOUNTER — Encounter: Payer: Self-pay | Admitting: Family Medicine

## 2014-10-06 ENCOUNTER — Ambulatory Visit (INDEPENDENT_AMBULATORY_CARE_PROVIDER_SITE_OTHER): Payer: Medicare Other | Admitting: Family Medicine

## 2014-10-06 ENCOUNTER — Telehealth: Payer: Self-pay | Admitting: *Deleted

## 2014-10-06 ENCOUNTER — Telehealth: Payer: Self-pay | Admitting: Internal Medicine

## 2014-10-06 VITALS — BP 116/68 | HR 64 | Temp 97.5°F | Wt 215.8 lb

## 2014-10-06 DIAGNOSIS — C9000 Multiple myeloma not having achieved remission: Secondary | ICD-10-CM

## 2014-10-06 DIAGNOSIS — G518 Other disorders of facial nerve: Secondary | ICD-10-CM | POA: Diagnosis not present

## 2014-10-06 MED ORDER — VALACYCLOVIR HCL 1 G PO TABS: 1000.0000 mg | ORAL_TABLET | Freq: Three times a day (TID) | ORAL | Status: DC

## 2014-10-06 MED ORDER — HYDROCODONE-ACETAMINOPHEN 7.5-325 MG PO TABS: 1.0000 | ORAL_TABLET | Freq: Four times a day (QID) | ORAL | Status: DC | PRN

## 2014-10-06 NOTE — Patient Instructions (Signed)
There is no evidence of any sinus problems. Your pain being so severe and out of proportion to any physical findings, and being superficial in the skin, makes me suspect shingles. We will treat as if shingles is present, despite there being no rash (yet). If you develop significant blisters and swelling, start the medrol dosepak you have at home (otherwise hold off).  Take the hydrocodone as needed for pain. Call us with an update towards the end of the week.

## 2014-10-06 NOTE — Telephone Encounter (Signed)
Per staff message and POF I have scheduled appts. Advised scheduler of appts. JMW  

## 2014-10-06 NOTE — Progress Notes (Signed)
Chief Complaint  Patient presents with  . sinus infection    sinus infection coming back from [facility], severe sinus pain left side, HA, no other symtpoms   3 weeks ago while at his house at [facility], he was diagnosed with acute bronchitis.  He was seen in UC and rx'd cefdinir, prednisone pack, and a cough syrup and hydrocodone-APAP 7.5/500 (just #11).  He had seen Dr. Earlie Server a few days earlier (3/2) and was prescribed a z-pak, but when he didn't get relief, he went to UC and ABX was changed. (didn't take the medrol dosepak from Dr. Earlie Server, just the prednisone pack from UC).  After about 2.5 weeks, he finally got better.   He was in [place] last week.  He woke up 3/24 with left facial numbness. 3/25 he woke up with severe throbbing pain on the left side of his face.  Started at left forehead, into the temple, and radiates down into the left cheek.  He took Tylenol Severe Pain.  Hydrocodone didn't touch the pain.  It feels swollen, puffy and hurts to touch.  It is described as a throbbing pain. Pain is constant. No worse with eating/chewing.  This feels different than the pain from the myeloma scalp lesions. He is in a volunteer shingles watch study.  He is ineligible for the vaccine due to his cancer treatments.  He had an outbreak of shingles during chemo on his chest (before the stem cell transplant).  PMH, PSH, SH and meds were reviewed.  Allergies  Allergen Reactions  . Penicillins     REACTION: rash   ROS:  Denies fevers, chills, head congestion, drainage, sore throat, cough, shortness of breath, chest pain. +associated nausea, no vomiting. No light sensitivity.  No bleeding, bruising, swelling, rash or other complaints.  PHYSICAL EXAM: BP 116/68 mmHg  Pulse 64  Temp(Src) 97.5 F (36.4 C) (Oral)  Wt 215 lb 12.8 oz (97.886 kg) Well developed, pleasant male, who appears not to be in signficant distress HEENT: PERRL, EOMI, conjunctiva is clear. Normal nasal mucosa, without  drainage or swelling. OP is normal without erythema or lesions. Moist mucus membranes. There is no temporal artery tenderness. Tender to light palpation over the areas of pain (left frontal area, temporalis muscle but no pain with contraction of muscle or deep palpation, and tender at L cheek down to upper jaw).  No pain with with opening/closing mouth No visible swelling, no rash or lesions  ASSESSENT/PLAN:  Neuralgic facial pain - suspect shingles, vs PHN. no evidence of sinus infection - Plan: valACYclovir (VALTREX) 1000 MG tablet  Multiple myeloma - Plan: HYDROcodone-acetaminophen (NORCO) 7.5-325 MG per tablet  Pain is severe, not well controlled by pain meds, with minimal physical findings. Suspect neuropathic pain--suspect shingles, prior to onset of rash. Given lack of physical findings, will not treat with steroids (he just recently took) Consider PHN  Treat with Valtrex.  Consider neuro eval if not improving with no other changes. Consider checking ESR if not improving this week

## 2014-10-06 NOTE — Telephone Encounter (Signed)
Patient called regarding appointment on 4/16, needs a Zometa appointment.  Diane Gloriann Loan notified and will contact patient.

## 2014-10-08 ENCOUNTER — Other Ambulatory Visit (HOSPITAL_BASED_OUTPATIENT_CLINIC_OR_DEPARTMENT_OTHER): Payer: Medicare Other

## 2014-10-08 DIAGNOSIS — C9000 Multiple myeloma not having achieved remission: Secondary | ICD-10-CM

## 2014-10-08 LAB — CBC WITH DIFFERENTIAL/PLATELET
BASO%: 5.4 % — AB (ref 0.0–2.0)
Basophils Absolute: 0.1 10*3/uL (ref 0.0–0.1)
EOS ABS: 0.4 10*3/uL (ref 0.0–0.5)
EOS%: 14.4 % — ABNORMAL HIGH (ref 0.0–7.0)
HCT: 38.6 % (ref 38.4–49.9)
HGB: 13 g/dL (ref 13.0–17.1)
LYMPH%: 24.1 % (ref 14.0–49.0)
MCH: 31.9 pg (ref 27.2–33.4)
MCHC: 33.7 g/dL (ref 32.0–36.0)
MCV: 94.6 fL (ref 79.3–98.0)
MONO#: 0.3 10*3/uL (ref 0.1–0.9)
MONO%: 12.5 % (ref 0.0–14.0)
NEUT#: 1.1 10*3/uL — ABNORMAL LOW (ref 1.5–6.5)
NEUT%: 43.6 % (ref 39.0–75.0)
NRBC: 0 % (ref 0–0)
Platelets: 120 10*3/uL — ABNORMAL LOW (ref 140–400)
RBC: 4.08 10*6/uL — ABNORMAL LOW (ref 4.20–5.82)
RDW: 14.9 % — ABNORMAL HIGH (ref 11.0–14.6)
WBC: 2.6 10*3/uL — AB (ref 4.0–10.3)
lymph#: 0.6 10*3/uL — ABNORMAL LOW (ref 0.9–3.3)

## 2014-10-08 LAB — COMPREHENSIVE METABOLIC PANEL (CC13)
ALT: 30 U/L (ref 0–55)
AST: 24 U/L (ref 5–34)
Albumin: 3.3 g/dL — ABNORMAL LOW (ref 3.5–5.0)
Alkaline Phosphatase: 45 U/L (ref 40–150)
Anion Gap: 5 mEq/L (ref 3–11)
BUN: 9.5 mg/dL (ref 7.0–26.0)
CO2: 26 meq/L (ref 22–29)
Calcium: 9.2 mg/dL (ref 8.4–10.4)
Chloride: 107 mEq/L (ref 98–109)
Creatinine: 1.1 mg/dL (ref 0.7–1.3)
EGFR: 70 mL/min/{1.73_m2} — AB (ref >90)
Glucose: 99 mg/dl (ref 70–140)
Potassium: 3.9 mEq/L (ref 3.5–5.1)
Sodium: 138 mEq/L (ref 136–145)
Total Bilirubin: 0.93 mg/dL (ref 0.20–1.20)
Total Protein: 6 g/dL — ABNORMAL LOW (ref 6.4–8.3)

## 2014-10-08 LAB — LACTATE DEHYDROGENASE (CC13): LDH: 138 U/L (ref 125–245)

## 2014-10-10 LAB — BETA 2 MICROGLOBULIN, SERUM: Beta-2 Microglobulin: 2.4 mg/L (ref <=2.51)

## 2014-10-10 LAB — KAPPA/LAMBDA LIGHT CHAINS
KAPPA FREE LGHT CHN: 4.39 mg/dL — AB (ref 0.33–1.94)
KAPPA LAMBDA RATIO: 1.55 (ref 0.26–1.65)
Lambda Free Lght Chn: 2.83 mg/dL — ABNORMAL HIGH (ref 0.57–2.63)

## 2014-10-10 LAB — IGG, IGA, IGM
IGM, SERUM: 30 mg/dL — AB (ref 41–251)
IgA: 158 mg/dL (ref 68–379)
IgG (Immunoglobin G), Serum: 1070 mg/dL (ref 650–1600)

## 2014-10-11 ENCOUNTER — Encounter: Payer: Self-pay | Admitting: Family Medicine

## 2014-10-13 ENCOUNTER — Ambulatory Visit (INDEPENDENT_AMBULATORY_CARE_PROVIDER_SITE_OTHER): Payer: Medicare Other | Admitting: Family Medicine

## 2014-10-13 ENCOUNTER — Encounter: Payer: Self-pay | Admitting: Family Medicine

## 2014-10-13 VITALS — BP 112/80 | HR 64 | Ht 69.0 in | Wt 214.4 lb

## 2014-10-13 DIAGNOSIS — H9202 Otalgia, left ear: Secondary | ICD-10-CM

## 2014-10-13 DIAGNOSIS — B029 Zoster without complications: Secondary | ICD-10-CM

## 2014-10-13 MED ORDER — TRAMADOL HCL 50 MG PO TABS: 50.0000 mg | ORAL_TABLET | Freq: Four times a day (QID) | ORAL | Status: DC | PRN

## 2014-10-13 NOTE — Patient Instructions (Signed)
Stop the hydrocodone. Use tylenol (acetaminophen) as needed for pain--you may continue the severe cold product, but when your sinus symptoms improve, you can switch back to plain tylenol. Use the tramadol as needed for severe pain.  This doesn't have acetaminophen in it. Add claritin and take once daily.  If you develop more congestion and sinus pain despite the decongestant and claritin, consider adding flonase nasal spray. If you develop thick mucus or cough, add mucinex. Finish out the steroid pack.

## 2014-10-13 NOTE — Progress Notes (Signed)
Chief Complaint  Patient presents with  . Follow-up    follow up on shingles.    Patient presents with complaint of left ear pressure, pain with swallowing on the left side, pain in the upper teeth/lower jaw/TMJ area.  He denies fever, chills, runny nose, cough.  Pain in the ear with swallowing and slight sore throat.  No swollen glands or fevers.  He has been taking Tylenol Severe Cold without much benefit.  He was treated for presumed shingles when seen with severe pain in the face. He ultimately had 2-3 bumps pop up, one at his temple, and one on the left tip of his nose. He took his last Valtrex yesterday.  He has a lot of pressure behind the left eye, but no eye pain itself--no change in vision, tearing, light sensitivity. He has appointment with optometrist at 2pm. He started the medrol dosepak that he had from Dr. Earlie Server 3 days ago (when he started with the rash and felt like the jaw was swelling). The pain has improved, but still taking hydrocodone twice daily.  Trying to cut back on the hydrocodone use.  He is in a shingles study through [facility] (had injections after transplant).  PMH, PSH, SH reviewed.  Outpatient Encounter Prescriptions as of 10/13/2014  Medication Sig Note  . acetaminophen (TYLENOL) 500 MG tablet Take 1,000 mg by mouth every 8 (eight) hours as needed.   Marland Kitchen ascorbic acid (VITAMIN C) 500 MG tablet Take 500 mg by mouth daily.   . B Complex-C (B-COMPLEX WITH VITAMIN C) tablet Take 1 tablet by mouth daily.   . cholecalciferol (VITAMIN D) 1000 UNITS tablet Take 1,000 Units by mouth daily.   . fish oil-omega-3 fatty acids 1000 MG capsule Take 1 g by mouth daily.    Marland Kitchen HYDROcodone-acetaminophen (NORCO) 7.5-325 MG per tablet Take 1 tablet by mouth every 6 (six) hours as needed for moderate pain. 10/13/2014: Has been taking 3x/day  . lenalidomide (REVLIMID) 15 MG capsule Take 1 capsule (15 mg total) by mouth daily.   Marland Kitchen LORazepam (ATIVAN) 1 MG tablet Take 1 tablet (1 mg  total) by mouth every 8 (eight) hours as needed for anxiety.   . methocarbamol (ROBAXIN) 500 MG tablet Take 1-2 tablets (500-1,000 mg total) by mouth every 6 (six) hours as needed for muscle spasms.   . Multiple Vitamin (MULTIVITAMIN WITH MINERALS) TABS Take 1 tablet by mouth daily.   . ondansetron (ZOFRAN) 4 MG tablet Take 1 tab every 6 hours as needed for nausea 06/18/2014: No nausea  . oxyCODONE-acetaminophen (PERCOCET) 10-325 MG per tablet Take 1 tablet by mouth every 6 (six) hours as needed for pain.   . pantoprazole (PROTONIX) 40 MG tablet Take 1 tablet (40 mg total) by mouth 2 (two) times daily. Take 1 tablet (40 mg total) by mouth Two (2) times a day.   . predniSONE (STERAPRED UNI-PAK) 10 MG tablet Please follow the daily instructions that come with the medication package. 10/13/2014: He is NOT taking this--taking medrol dosepak that was prescribed by Dr. Earlie Server  . Probiotic Product (ALIGN) 4 MG CAPS Take 1 capsule by mouth daily.   . prochlorperazine (COMPAZINE) 10 MG tablet Take 1 tablet (10 mg total) by mouth every 6 (six) hours as needed.   . rivaroxaban (XARELTO) 20 MG TABS tablet Take by mouth. 10/13/2014: Received from: [facility]  . temazepam (RESTORIL) 15 MG capsule Take 1 capsule (15 mg total) by mouth at bedtime as needed.   Alveda Reasons 20  MG TABS tablet TAKE 1 TABLET BY MOUTH DAILY WITH SUPPER (Patient taking differently: TAKE 1 TABLET BY MOUTH DAILY)   . zolendronic acid (ZOMETA) 4 MG/5ML injection Inject 4 mg into the vein every 28 (twenty-eight) days. 10/06/2014: Getting it about every other month  . [DISCONTINUED] HYDROcodone-acetaminophen (LORTAB) 7.5-500 MG per tablet Take by mouth. 10/13/2014: Received from: [facility]  . [DISCONTINUED] dicyclomine (BENTYL) 10 MG capsule Take 1 tab every 6 hours as needed for cramping, bloating, diarrhea. (Patient not taking: Reported on 10/06/2014)   . [DISCONTINUED] emollient (BIAFINE) cream Apply topically as needed.   . [DISCONTINUED]  Lenalidomide 20 MG CAPS Take by mouth. 10/13/2014: Received from: [facility]  . [DISCONTINUED] methylPREDNIsolone (MEDROL DOSPACK) 4 MG tablet  10/13/2014: Received from: External Pharmacy  . [DISCONTINUED] valACYclovir (VALTREX) 1000 MG tablet Take 1 tablet (1,000 mg total) by mouth 3 (three) times daily. (Patient not taking: Reported on 10/13/2014) 10/13/2014: Completed 7 day course   Allergies  Allergen Reactions  . Penicillins     REACTION: rash    ROS:  No fevers, chills, dizziness, vision changes, chest pain, palpitations.  Headache is improving.  +ear pain, sore throat, pressure behind left eye as per HPI.  No bleeding, bruising, no rash except as noted in HPI.    PHYSICAL EXAM: BP 112/80 mmHg  Pulse 64  Ht 5\' 9"  (1.753 m)  Wt 214 lb 6.4 oz (97.251 kg)  BMI 31.65 kg/m2 Well developed, pleasant male, in no distress Skin on face: Papule at tip of nose, on the left side.  It is slightly pink.  Not vesicular.  He also has one drying lesion at his left temple.  Remainder of skin is clear. No erythematous base. HEENT: PERRL, EOMI, conjunctiva clear.  TM's and EAC's are normal.  Nasal mucosa is pale, moderately edematous on the left with clear mucus.  He is periodically clearing his throat during the visit.  OP is notable for some asymmetry in soft palate elevation (rises less on the left than the right), but no mucosal abnormalities are noted. Neck: no lymphadenopathy or mass Heart: regular rate and rhythm Lungs: clear bilaterally Extremities: no edema Neuro: alert and oriented.  Cranial nerves intact, except for palate asymmetry as noted above.  Normal strength, gait Psych: normal mood, affect, hygiene and grooming  ASSESSMENT/PLAN:  Shingles - pain diminished, s/p course of Valtrex. change from hydrocodone to tramadol - Plan: traMADol (ULTRAM) 50 MG tablet  Left ear pain - suspect allergies with some ETD. continue decongestant, add antihistamine   Current symptoms may partly be due  to shingles, however it appears that he also has a component of allergies, PND and ETD.  Add Claritin.  Continue decongestant (phenylephrine). F/u with ophtho as scheduled today. Discussed pain medications. Discussed risks of getting acetaminophen from multiple sources.  He has been trying to cut back on hydrocodone and using tylenol (currently the sinus one) instead.  Still sometimes the pain isn't well controlled, and needing hydrocodone.  Doesn't want to stay on this.  Change to tramadol to use just prn severe pain, not controlled with acetaminophen. Discussed if pain persists at this level through next week, adding another med for nerve pain (PHN) such as lyrica or neurontin or cymbalta.

## 2014-10-15 ENCOUNTER — Ambulatory Visit (HOSPITAL_BASED_OUTPATIENT_CLINIC_OR_DEPARTMENT_OTHER): Payer: Medicare Other

## 2014-10-15 ENCOUNTER — Ambulatory Visit (HOSPITAL_BASED_OUTPATIENT_CLINIC_OR_DEPARTMENT_OTHER): Payer: Medicare Other | Admitting: Internal Medicine

## 2014-10-15 ENCOUNTER — Telehealth: Payer: Self-pay | Admitting: Internal Medicine

## 2014-10-15 ENCOUNTER — Encounter: Payer: Self-pay | Admitting: Internal Medicine

## 2014-10-15 VITALS — BP 136/75 | HR 57 | Temp 98.5°F | Resp 18 | Ht 69.0 in | Wt 213.0 lb

## 2014-10-15 DIAGNOSIS — C9000 Multiple myeloma not having achieved remission: Secondary | ICD-10-CM

## 2014-10-15 DIAGNOSIS — I2699 Other pulmonary embolism without acute cor pulmonale: Secondary | ICD-10-CM | POA: Diagnosis not present

## 2014-10-15 MED ORDER — HEPARIN SOD (PORK) LOCK FLUSH 100 UNIT/ML IV SOLN
500.0000 [IU] | Freq: Once | INTRAVENOUS | Status: AC
  Administered 2014-10-15: 500 [IU] via INTRAVENOUS
  Filled 2014-10-15: qty 5

## 2014-10-15 MED ORDER — SODIUM CHLORIDE 0.9 % IJ SOLN
3.0000 mL | Freq: Once | INTRAMUSCULAR | Status: DC | PRN
  Filled 2014-10-15: qty 10

## 2014-10-15 MED ORDER — TEMAZEPAM 15 MG PO CAPS: 15.0000 mg | ORAL_CAPSULE | Freq: Every evening | ORAL | Status: DC | PRN

## 2014-10-15 MED ORDER — SODIUM CHLORIDE 0.9 % IV SOLN
Freq: Once | INTRAVENOUS | Status: AC
  Administered 2014-10-15: 09:00:00 via INTRAVENOUS

## 2014-10-15 MED ORDER — SODIUM CHLORIDE 0.9 % IJ SOLN
10.0000 mL | INTRAMUSCULAR | Status: DC | PRN
  Administered 2014-10-15: 10 mL via INTRAVENOUS
  Filled 2014-10-15: qty 10

## 2014-10-15 MED ORDER — ZOLEDRONIC ACID 4 MG/100ML IV SOLN
4.0000 mg | Freq: Once | INTRAVENOUS | Status: AC
  Administered 2014-10-15: 4 mg via INTRAVENOUS
  Filled 2014-10-15: qty 100

## 2014-10-15 MED ORDER — HEPARIN SOD (PORK) LOCK FLUSH 100 UNIT/ML IV SOLN
250.0000 [IU] | Freq: Once | INTRAVENOUS | Status: DC | PRN
Start: 2014-10-15 — End: 2014-10-15
  Filled 2014-10-15: qty 5

## 2014-10-15 NOTE — Telephone Encounter (Signed)
Gave and printed appt sched and avs fo rpt for May °

## 2014-10-15 NOTE — Patient Instructions (Signed)
Zoledronic Acid injection (Hypercalcemia, Oncology) (Zometa) What is this medicine? ZOLEDRONIC ACID (ZOE le dron ik AS id) lowers the amount of calcium loss from bone. It is used to treat too much calcium in your blood from cancer. It is also used to prevent complications of cancer that has spread to the bone. This medicine may be used for other purposes; ask your health care provider or pharmacist if you have questions. COMMON BRAND NAME(S): Zometa What should I tell my health care provider before I take this medicine? They need to know if you have any of these conditions: -aspirin-sensitive asthma -cancer, especially if you are receiving medicines used to treat cancer -dental disease or wear dentures -infection -kidney disease -receiving corticosteroids like dexamethasone or prednisone -an unusual or allergic reaction to zoledronic acid, other medicines, foods, dyes, or preservatives -pregnant or trying to get pregnant -breast-feeding How should I use this medicine? This medicine is for infusion into a vein. It is given by a health care professional in a hospital or clinic setting. Talk to your pediatrician regarding the use of this medicine in children. Special care may be needed. Overdosage: If you think you have taken too much of this medicine contact a poison control center or emergency room at once. NOTE: This medicine is only for you. Do not share this medicine with others. What if I miss a dose? It is important not to miss your dose. Call your doctor or health care professional if you are unable to keep an appointment. What may interact with this medicine? -certain antibiotics given by injection -NSAIDs, medicines for pain and inflammation, like ibuprofen or naproxen -some diuretics like bumetanide, furosemide -teriparatide -thalidomide This list may not describe all possible interactions. Give your health care provider a list of all the medicines, herbs, non-prescription drugs,  or dietary supplements you use. Also tell them if you smoke, drink alcohol, or use illegal drugs. Some items may interact with your medicine. What should I watch for while using this medicine? Visit your doctor or health care professional for regular checkups. It may be some time before you see the benefit from this medicine. Do not stop taking your medicine unless your doctor tells you to. Your doctor may order blood tests or other tests to see how you are doing. Women should inform their doctor if they wish to become pregnant or think they might be pregnant. There is a potential for serious side effects to an unborn child. Talk to your health care professional or pharmacist for more information. You should make sure that you get enough calcium and vitamin D while you are taking this medicine. Discuss the foods you eat and the vitamins you take with your health care professional. Some people who take this medicine have severe bone, joint, and/or muscle pain. This medicine may also increase your risk for jaw problems or a broken thigh bone. Tell your doctor right away if you have severe pain in your jaw, bones, joints, or muscles. Tell your doctor if you have any pain that does not go away or that gets worse. Tell your dentist and dental surgeon that you are taking this medicine. You should not have major dental surgery while on this medicine. See your dentist to have a dental exam and fix any dental problems before starting this medicine. Take good care of your teeth while on this medicine. Make sure you see your dentist for regular follow-up appointments. What side effects may I notice from receiving this medicine? Side effects  that you should report to your doctor or health care professional as soon as possible: -allergic reactions like skin rash, itching or hives, swelling of the face, lips, or tongue -anxiety, confusion, or depression -breathing problems -changes in vision -eye pain -feeling faint  or lightheaded, falls -jaw pain, especially after dental work -mouth sores -muscle cramps, stiffness, or weakness -trouble passing urine or change in the amount of urine Side effects that usually do not require medical attention (report to your doctor or health care professional if they continue or are bothersome): -bone, joint, or muscle pain -constipation -diarrhea -fever -hair loss -irritation at site where injected -loss of appetite -nausea, vomiting -stomach upset -trouble sleeping -trouble swallowing -weak or tired This list may not describe all possible side effects. Call your doctor for medical advice about side effects. You may report side effects to FDA at 1-800-FDA-1088. Where should I keep my medicine? This drug is given in a hospital or clinic and will not be stored at home. NOTE: This sheet is a summary. It may not cover all possible information. If you have questions about this medicine, talk to your doctor, pharmacist, or health care provider.  2015, Elsevier/Gold Standard. (2012-12-06 13:03:13)

## 2014-10-15 NOTE — Progress Notes (Signed)
[facility] Telephone:([phone]) [phone]   Fax:([phone]) [place], [place] [address]  DIAGNOSIS: Stage IIA multiple myeloma diagnosed in July of 2013.   PRIOR THERAPY:  1) Systemic chemotherapy with subcutaneous Velcade 1.3 mg/M2 on days 1, 4, 8 and 11 every 3 weeks in addition to Revlimid 25 mg by mouth daily for 14 days every 3 weeks and dexamethasone 40 mg on a weekly basis. Status post 4 planned cycles.  2) Systemic chemotherapy with subcutaneous Velcade 1.3 mg/M2 on days 1, 4, 8 and 11 every 3 weeks in addition to Revlimid 25 mg by mouth daily for 14 days every 3 weeks and dexamethasone 40 mg on a weekly basis. 4 more cycles are planned and he is status post 4 cycles of the second set of 4 cycles, now status post a total of 10 cycles. 3) Systemic chemotherapy with Carfilzomib 36 mg/M2 on days 1, 2, 8, 9, 15 and 16 in addition to cyclophosphamide 300 mg/M2 IV on days 1, 8 and 15 as well as dexamethasone on days 1, 8, 15 and 22 every 4 weeks. Status post 4 cycles. 4) Zometa 4 mg IV every month. 5) autologous peripheral blood stem cell transplant on 03/20/2013 at [facility]. 6) Consolidation chemotherapy with Carfilzomib 36 mg/M2 on days 1, 2, 8, 9, 15 and 16 every 4 weeks in addition to cyclophosphamide 300 mg/M2 on days 1, 8 and 15 as well as dexamethasone 40 mg on a weekly basis every 4 weeks. Status post 2 cycles. First cycle 06/11/2013. 7) Maintenance therapy with Revlimid 10 mg by mouth daily status post 3 months of treatment.   CURRENT THERAPY:  1) Maintenance therapy with Revlimid 15 mg by mouth daily started 11/09/2013. 2) Zometa 4 mg IV every 2 months. 3) Xarelto 20 mg by mouth daily for pulmonary embolism. 4) palliative radiotherapy to the lytic lesion in the skull under the care of Dr. Sondra Come   INTERVAL HISTORY: [name] 74 y.o. male returns to the clinic today for routine followup  visit. The patient is feeling fine today with no specific complaints. He was recently treated with a course of Valtrex for shingles on the right side of his face. He is very active with no current limitations. He denied having any significant fatigue or weakness. He denied having any fever or chills. He has no nausea or vomiting.  He denied having any significant chest pain, shortness of breath, cough or hemoptysis. He is tolerating his treatment with Revlimid fairly well. He had repeat myeloma panel performed recently and he is here for evaluation and discussion of his lab results.  MEDICAL HISTORY: Past Medical History  Diagnosis Date  . GERD (gastroesophageal reflux disease)   . Adenomatous polyp of colon 1996  . Arthritis     Osteoarthritis right knee, spine, wrist  . Benign prostatic hypertrophy     (Dr. Karsten Ro)  . History of chronic prostatitis   . History of melanoma     Back (Dr. Wilhemina Bonito); early melanoma R arm 03/2011  . Diverticulosis   . Arthritis of hand, right     Right thumb (Dr. Daylene Katayama)  . Degenerative disc disease   . Hemorrhoids   . Erosive esophagitis   . Back ache     r/o Multiple Myeloma  . Multiple myeloma 2013    skull lytic lesions--treated with radiation 07/2014  . DVT (deep venous thrombosis) 01/2014  R calf  . Pulmonary embolism 01/2014  . Radiation 07/21/14-08/05/14    left occipital parietal skull 25 gray    ALLERGIES:  is allergic to penicillins.  MEDICATIONS:  Current Outpatient Prescriptions  Medication Sig Dispense Refill  . acetaminophen (TYLENOL) 500 MG tablet Take 1,000 mg by mouth every 8 (eight) hours as needed.    Marland Kitchen ascorbic acid (VITAMIN C) 500 MG tablet Take 500 mg by mouth daily.    . B Complex-C (B-COMPLEX WITH VITAMIN C) tablet Take 1 tablet by mouth daily.    . cefdinir (OMNICEF) 300 MG capsule     . cholecalciferol (VITAMIN D) 1000 UNITS tablet Take 1,000 Units by mouth daily.    . fish oil-omega-3 fatty acids 1000 MG capsule Take 1  g by mouth daily.     Marland Kitchen lenalidomide (REVLIMID) 15 MG capsule Take 1 capsule (15 mg total) by mouth daily. 28 capsule 0  . LORazepam (ATIVAN) 1 MG tablet Take 1 tablet (1 mg total) by mouth every 8 (eight) hours as needed for anxiety. 30 tablet 0  . methocarbamol (ROBAXIN) 500 MG tablet Take 1-2 tablets (500-1,000 mg total) by mouth every 6 (six) hours as needed for muscle spasms. 60 tablet 0  . Multiple Vitamin (MULTIVITAMIN WITH MINERALS) TABS Take 1 tablet by mouth daily.    . pantoprazole (PROTONIX) 40 MG tablet Take 1 tablet (40 mg total) by mouth 2 (two) times daily. Take 1 tablet (40 mg total) by mouth Two (2) times a day. 60 tablet 5  . PEDVAX HIB 7.5 MCG/0.5ML SUSP injection     . predniSONE (STERAPRED UNI-PAK) 10 MG tablet Please follow the daily instructions that come with the medication package.    . Probiotic Product (ALIGN) 4 MG CAPS Take 1 capsule by mouth daily.    . prochlorperazine (COMPAZINE) 10 MG tablet Take 1 tablet (10 mg total) by mouth every 6 (six) hours as needed. 30 tablet 1  . rivaroxaban (XARELTO) 20 MG TABS tablet Take by mouth.    . temazepam (RESTORIL) 15 MG capsule Take 1 capsule (15 mg total) by mouth at bedtime as needed. 30 capsule 0  . traMADol (ULTRAM) 50 MG tablet Take 1-2 tablets (50-100 mg total) by mouth every 6 (six) hours as needed for severe pain. 30 tablet 1  . valACYclovir (VALTREX) 1000 MG tablet   0  . XARELTO 20 MG TABS tablet TAKE 1 TABLET BY MOUTH DAILY WITH SUPPER (Patient taking differently: TAKE 1 TABLET BY MOUTH DAILY) 90 tablet 0  . zolendronic acid (ZOMETA) 4 MG/5ML injection Inject 4 mg into the vein every 28 (twenty-eight) days.    Marland Kitchen HYDROcodone-acetaminophen (NORCO) 7.5-325 MG per tablet Take 1 tablet by mouth every 6 (six) hours as needed for moderate pain. (Patient not taking: Reported on 10/15/2014) 30 tablet 0  . ondansetron (ZOFRAN) 4 MG tablet Take 1 tab every 6 hours as needed for nausea (Patient not taking: Reported on 10/15/2014) 40  tablet 1  . oxyCODONE-acetaminophen (PERCOCET) 10-325 MG per tablet Take 1 tablet by mouth every 6 (six) hours as needed for pain. (Patient not taking: Reported on 10/15/2014) 60 tablet 0   No current facility-administered medications for this visit.   Facility-Administered Medications Ordered in Other Visits  Medication Dose Route Frequency Provider Last Rate Last Dose  . ondansetron (ZOFRAN) tablet 8 mg  8 mg Oral Once Curt Bears, MD        SURGICAL HISTORY:  Past Surgical History  Procedure Laterality Date  .  Total knee replaced  2000    Left knee  . Appendectomy    . Colonoscopy  2009    adenomatous polyp  . Esophagogastroduodenoscopy  2009    mild inflammation at esophagogastric junction  . Excision of melanoma      back ( x3)  . Great toe surgery      bilateral  . Inguinal hernia repair      left, x 2  . Total knee arthroplasty  06/08/2011    Procedure: TOTAL KNEE ARTHROPLASTY;  Surgeon: Ninetta Lights, MD;  Location: [facility];  Service: Orthopedics;  Laterality: Right;  osteonics  . [place]  10/2012  . Bone marrow transplant  03/2013    Stem Cell    REVIEW OF SYSTEMS:  Constitutional: negative Eyes: negative Ears, nose, mouth, throat, and face: negative Respiratory: negative Cardiovascular: negative Gastrointestinal: negative Genitourinary:negative Integument/breast: negative Hematologic/lymphatic: negative Musculoskeletal:negative Neurological: negative Behavioral/Psych: negative Endocrine: negative Allergic/Immunologic: negative   PHYSICAL EXAMINATION: General appearance: alert, cooperative and no distress Head: Normocephalic, without obvious abnormality, atraumatic Neck: no adenopathy, no JVD, supple, symmetrical, trachea midline and thyroid not enlarged, symmetric, no tenderness/mass/nodules Lymph nodes: Cervical, supraclavicular, and axillary nodes normal. Resp: clear to auscultation bilaterally Back: symmetric, no curvature. ROM normal. No CVA  tenderness. Cardio: regular rate and rhythm, S1, S2 normal, no murmur, click, rub or gallop GI: soft, non-tender; bowel sounds normal; no masses,  no organomegaly Extremities: extremities normal, atraumatic, no cyanosis or edema Neurologic: Alert and oriented X 3, normal strength and tone. Normal symmetric reflexes. Normal coordination and gait  ECOG PERFORMANCE STATUS: 1 - Symptomatic but completely ambulatory  Blood pressure 136/75, pulse 57, temperature 98.5 F (36.9 C), temperature source Oral, resp. rate 18, height $RemoveBe'5\' 9"'[id number]$  (1.753 m), weight 213 lb (96.616 kg), SpO2 100 %.  LABORATORY DATA: Lab Results  Component Value Date   WBC 2.6* 10/08/2014   HGB 13.0 10/08/2014   HCT 38.6 10/08/2014   MCV 94.6 10/08/2014   PLT 120* 10/08/2014      Chemistry      Component Value Date/Time   NA 138 10/08/2014 0759   NA 139 04/22/2014 1417   K 3.9 10/08/2014 0759   K 3.9 04/22/2014 1417   CL 107 04/22/2014 1417   CL 109* 12/31/2012 0759   CO2 26 10/08/2014 0759   CO2 22 04/22/2014 1417   BUN 9.5 10/08/2014 0759   BUN 10 04/22/2014 1417   CREATININE 1.1 10/08/2014 0759   CREATININE 1.1 04/22/2014 1417   CREATININE 0.97 11/18/2013 1045      Component Value Date/Time   CALCIUM 9.2 10/08/2014 0759   CALCIUM 8.8 04/22/2014 1417   ALKPHOS 45 10/08/2014 0759   ALKPHOS 47 04/22/2014 1417   AST 24 10/08/2014 0759   AST 18 04/22/2014 1417   ALT 30 10/08/2014 0759   ALT 22 04/22/2014 1417   BILITOT 0.93 10/08/2014 0759   BILITOT 0.8 04/22/2014 1417     Myeloma panel: Beta-2 microglobulin 2.83, free kappa light chain 3.12, free lambda light chain 2.65 and kappa/lambda ratio 1.18. IgG 1090, IgA 180 and IgM 30.  RADIOGRAPHIC STUDIES: No results found.  ASSESSMENT AND PLAN: This is a very pleasant 74 years old white male with history of multiple myeloma status post induction chemotherapy with Velcade, Revlimid and dexamethasone followed by treatment with Carfilzomib, cyclophosphamide and  dexamethasone with significant improvement in his disease followed by autologous peripheral blood stem cell transplant at [facility] on 03/20/2013. This was followed by  2 cycles of consolidation chemotherapy with Carfilzomib, Cytoxan and dexamethasone, and he tolerated it fairly well. He completed maintenance Revlimid 10 mg by mouth daily for 3 months and tolerating it fairly well. He started maintenance Revlimid with 15 mg by mouth daily in May 2015 and tolerating it well. The recent myeloma panel showed no evidence for disease progression. I discussed the lab result with the patient today. He will continue treatment with Zometa every 2 months.  For the pulmonary embolism, he will continue on Xarelto 20 mg by mouth daily as scheduled. I advised the patient that he can hold his treatment with Xarelto for 2-3 days if needed to undergo his screening colonoscopy. He would come back for follow-up visit in one month for reevaluation with repeat blood work. He was advised to call immediately if he has any concerning symptoms in the interval. The patient voices understanding of current disease status and treatment options and is in agreement with the current care plan.  All questions were answered. The patient knows to call the clinic with any problems, questions or concerns. We can certainly see the patient much sooner if necessary.  Disclaimer: This note was dictated with voice recognition software. Similar sounding words can inadvertently be transcribed and may not be corrected upon review.

## 2014-10-23 ENCOUNTER — Encounter: Payer: Self-pay | Admitting: Family Medicine

## 2014-10-27 ENCOUNTER — Ambulatory Visit (INDEPENDENT_AMBULATORY_CARE_PROVIDER_SITE_OTHER): Payer: Medicare Other | Admitting: Family Medicine

## 2014-10-27 ENCOUNTER — Encounter: Payer: Self-pay | Admitting: Family Medicine

## 2014-10-27 ENCOUNTER — Other Ambulatory Visit: Payer: Self-pay | Admitting: *Deleted

## 2014-10-27 VITALS — BP 104/64 | HR 62 | Wt 216.0 lb

## 2014-10-27 DIAGNOSIS — C9000 Multiple myeloma not having achieved remission: Secondary | ICD-10-CM | POA: Diagnosis not present

## 2014-10-27 DIAGNOSIS — N529 Male erectile dysfunction, unspecified: Secondary | ICD-10-CM

## 2014-10-27 DIAGNOSIS — H6122 Impacted cerumen, left ear: Secondary | ICD-10-CM

## 2014-10-27 DIAGNOSIS — Z8619 Personal history of other infectious and parasitic diseases: Secondary | ICD-10-CM

## 2014-10-27 MED ORDER — LENALIDOMIDE 15 MG PO CAPS: 15.0000 mg | ORAL_CAPSULE | Freq: Every day | ORAL | Status: DC

## 2014-10-27 MED ORDER — TADALAFIL 20 MG PO TABS: 20.0000 mg | ORAL_TABLET | Freq: Every day | ORAL | Status: DC | PRN

## 2014-10-27 NOTE — Telephone Encounter (Signed)
Revlimid rx refilled Authorization# [id number] e-scribed and faxed to [facility]

## 2014-10-27 NOTE — Progress Notes (Signed)
Subjective:    Patient ID: Jovani S Hoos, male    DOB: 12/28/1940, 73 y.o.   MRN: [id number]  HPI He is here for consult concerning a feeling of fullness in his left ear. He has a previous history of multiple myeloma and recently suffered from left facial shingles. He does have some residual numbness. He also is complaining of slight headache and does use tramadol with good results. He's had no fever, chills, sore throat or  Earache.he also states that over the last several months he has had intermittent difficulty with getting and maintaining an erection. His medications were reviewed. He does not smoke or drink. He is on no meds.   Review of Systems     Objective:   Physical Exam Alert and in no distress. Residual hair loss is noted in the very distinct pattern from radiation. The left canal did have cerumen which was easily removed. The canal and TM are normal. Right canal and TM normal. Neck is supple without adenopathy.       Assessment & Plan:  Multiple myeloma  History of shingles  Erectile dysfunction, unspecified erectile dysfunction type - Plan: tadalafil (CIALIS) 20 MG tablet  Cerumen impaction, left reassured him that I did not finding nothing wrong with his ears. He did hear much better after the cerumen was removed. Discussed use of Cialis in terms of efficacy and possible side effects. Explained that this is like prior to the pump and once we get the blood flow again, he will not necessarily needs Cialis for each sexual encounter. He expressed understanding of this.  

## 2014-10-31 ENCOUNTER — Telehealth: Payer: Self-pay | Admitting: *Deleted

## 2014-10-31 NOTE — Telephone Encounter (Signed)
Call received in [place] from pt stating he has noticed bilateral " redness" on my lower legs"  No swelling or areas of tenderness " just kinda red and equal on both legs "  Pt is inquiring " could it be the Revlimid and the Xarelto ?"  Per discussion pt will hold the Revlimid over the weekend and call on Monday with update.  This RN did advise pt if redness worsens, he develops swelling or pain or bruising he is to contact the on call over the weekend.  Pt verbalized understanding.  This note will be sent to MD and RN at desk for communication.

## 2014-10-31 NOTE — Telephone Encounter (Signed)
See other note

## 2014-11-02 ENCOUNTER — Other Ambulatory Visit: Payer: Self-pay | Admitting: Physician Assistant

## 2014-11-03 ENCOUNTER — Telehealth: Payer: Self-pay | Admitting: Medical Oncology

## 2014-11-03 NOTE — Telephone Encounter (Signed)
Lower extremity burning and now swelling . Was told to stop revlimid by on call MD .  Appt given for smc for tomorrow

## 2014-11-03 NOTE — Telephone Encounter (Signed)
See note from Friday 4/19.  Per call today - pt states he is still experiancing " burning ".  States he left a message earlier today per above with inquiry if he needs to continue off Revlimid. Pt is scheduled to see Dr MM next week.  ( per call Friday with above information pt held Revlimid over the weekend ).  At present pt will hold his Revlimid until concerns reviewed by MD for appropriate recommendation.  Best number to reach pt is [phone].

## 2014-11-04 ENCOUNTER — Ambulatory Visit (HOSPITAL_BASED_OUTPATIENT_CLINIC_OR_DEPARTMENT_OTHER): Payer: Medicare Other | Admitting: Nurse Practitioner

## 2014-11-04 ENCOUNTER — Encounter: Payer: Self-pay | Admitting: Nurse Practitioner

## 2014-11-04 VITALS — BP 131/76 | HR 58 | Temp 97.9°F | Resp 18 | Wt 215.9 lb

## 2014-11-04 DIAGNOSIS — R609 Edema, unspecified: Secondary | ICD-10-CM | POA: Diagnosis not present

## 2014-11-04 DIAGNOSIS — M25562 Pain in left knee: Secondary | ICD-10-CM | POA: Diagnosis not present

## 2014-11-04 DIAGNOSIS — C9000 Multiple myeloma not having achieved remission: Secondary | ICD-10-CM

## 2014-11-04 DIAGNOSIS — R600 Localized edema: Secondary | ICD-10-CM

## 2014-11-04 DIAGNOSIS — Z7901 Long term (current) use of anticoagulants: Secondary | ICD-10-CM | POA: Insufficient documentation

## 2014-11-04 DIAGNOSIS — M25561 Pain in right knee: Secondary | ICD-10-CM | POA: Diagnosis not present

## 2014-11-04 MED ORDER — FUROSEMIDE 20 MG PO TABS: 20.0000 mg | ORAL_TABLET | Freq: Every day | ORAL | Status: DC

## 2014-11-04 NOTE — Assessment & Plan Note (Signed)
Patient has a history of bilateral knee replacements in the past.  Patient states that his bilateral knees have been bothering him for the past several weeks.  He is complaining of increased pain to his bilateral knees; as well as some edema to his knee areas.  He denies any known injury or trauma.  On exam-it does appear that there is some trace edema surrounding his bilateral knee joints.  He continues to ambulate with no difficulty whatsoever.  Will continue to monitor closely.

## 2014-11-04 NOTE — Assessment & Plan Note (Signed)
Patient has been diagnosed in the past with both a DVT and a pulmonary embolism.  He continues to take Xarelto as previously directed.

## 2014-11-04 NOTE — Assessment & Plan Note (Signed)
Patient complaining of bilateral lower extremity burning and stinging from the knees down to the ankles for the past few weeks.  He has also noted some increased edema to his bilateral lower extremities as well.  He has a history of bilateral knee replacements; and he also reports that his knees have had some increased pain and edema as well.  He denies any known injury or trauma to his lower extremities.  On exam-patient does have trace +1 peripheral edema to his bilateral lower legs.  Patient was advised to elevate his legs above the level of his heart whenever possible.  Patient states he has no interest in trying compression hose; since he remained so active outside on a daily basis.  Patient was prescribed Lasix 20 mg on a daily basis to take only when necessary for peripheral edema.  Also, patient was advised to call/return or go directly to the emergency for any worsening symptoms whatsoever.

## 2014-11-04 NOTE — Assessment & Plan Note (Signed)
Patient was instructed to hold his Revlimid over the weekend for complaint of increased burning and tingling to his bilateral lower extremities from the knee to the ankle only.  Patient states that he did not take his Revlimid on Saturday or Sunday; but did take the Revlimid on Monday, 11/03/2014.  Patient also received his last Zometa infusion on 10/15/2014.  The decision has been made to only receive the Zometa on an every two-month basis.  He will next be due for his Zometa on 12/15/2014.  While Revlimid can cause some increased neuropathy and peripheral edema-patient was advised per Dr. Earlie Server to continue with the Revlimid as previously directed.  Will order diuretic on a when necessary basis to see if that helps.  Patient is scheduled to return on 11/12/2014 for labs and a follow-up visit.

## 2014-11-04 NOTE — Progress Notes (Signed)
SYMPTOM MANAGEMENT CLINIC   HPI: [name] 74 y.o. male diagnosed with multiple myeloma.  Patient is status post auto largest stem cell transplant on 03/20/2013.  Currently undergoing Revlimid oral therapy and Zometa infusions.   Patient was instructed to hold his Revlimid over the weekend for complaint of increased burning and tingling to his bilateral lower extremities from the knee to the ankle only.  Patient states that he did not take his Revlimid on Saturday or Sunday; but did take the Revlimid on Monday, 11/03/2014.  Patient also received his last Zometa infusion on 10/15/2014.  The decision has been made to only receive the Zometa on an every two-month basis.  He will next be due for his Zometa on 12/15/2014.  Patient denies any known injury or trauma to his lower extremities.  He also denies any chest pain, chest pressure, shortness breath, or pain with inspiration.  He denies any recent fevers or chills.  HPI  ROS  Past Medical History  Diagnosis Date  . GERD (gastroesophageal reflux disease)   . Adenomatous polyp of colon 1996  . Arthritis     Osteoarthritis right knee, spine, wrist  . Benign prostatic hypertrophy     (Dr. Karsten Ro)  . History of chronic prostatitis   . History of melanoma     Back (Dr. Wilhemina Bonito); early melanoma R arm 03/2011  . Diverticulosis   . Arthritis of hand, right     Right thumb (Dr. Daylene Katayama)  . Degenerative disc disease   . Hemorrhoids   . Erosive esophagitis   . Back ache     r/o Multiple Myeloma  . Multiple myeloma 2013    skull lytic lesions--treated with radiation 07/2014  . DVT (deep venous thrombosis) 01/2014    R calf  . Pulmonary embolism 01/2014  . Radiation 07/21/14-08/05/14    left occipital parietal skull 25 gray    Past Surgical History  Procedure Laterality Date  . Total knee replaced  2000    Left knee  . Appendectomy    . Colonoscopy  2009    adenomatous polyp  . Esophagogastroduodenoscopy  2009    mild  inflammation at esophagogastric junction  . Excision of melanoma      back ( x3)  . Great toe surgery      bilateral  . Inguinal hernia repair      left, x 2  . Total knee arthroplasty  06/08/2011    Procedure: TOTAL KNEE ARTHROPLASTY;  Surgeon: Ninetta Lights, MD;  Location: [facility];  Service: Orthopedics;  Laterality: Right;  osteonics  . [place]  10/2012  . Bone marrow transplant  03/2013    Stem Cell    has GERD; IRRITABLE BOWEL SYNDROME; ABDOMINAL PAIN-RUQ; PERSONAL HX COLONIC POLYPS; Dyspnea; Preop cardiovascular exam; Hyponatremia; Anemia; Multiple myeloma; DVT of leg (deep venous thrombosis); Acute pulmonary embolism; Acute deep vein thrombosis (DVT) of tibial vein of right lower extremity; Abdominal aortic atherosclerosis; Headache; Long term current use of anticoagulant therapy; Peripheral edema; and Knee pain, bilateral on his problem list.    is allergic to penicillins.    Medication List       This list is accurate as of: 11/04/14 11:13 AM.  Always use your most recent med list.               acetaminophen 500 MG tablet  Commonly known as:  TYLENOL  Take 1,000 mg by mouth every 8 (eight) hours as needed.  ALIGN 4 MG Caps  Take 1 capsule by mouth daily.     ascorbic acid 500 MG tablet  Commonly known as:  VITAMIN C  Take 500 mg by mouth daily.     B-complex with vitamin C tablet  Take 1 tablet by mouth daily.     cholecalciferol 1000 UNITS tablet  Commonly known as:  VITAMIN D  Take 1,000 Units by mouth daily.     fish oil-omega-3 fatty acids 1000 MG capsule  Take 1 g by mouth daily.     furosemide 20 MG tablet  Commonly known as:  LASIX  Take 1 tablet (20 mg total) by mouth daily.     HYDROcodone-acetaminophen 7.5-325 MG per tablet  Commonly known as:  NORCO  Take 1 tablet by mouth every 6 (six) hours as needed for moderate pain.     lenalidomide 15 MG capsule  Commonly known as:  REVLIMID  Take 1 capsule (15 mg total) by mouth daily.      LORazepam 1 MG tablet  Commonly known as:  ATIVAN  Take 1 tablet (1 mg total) by mouth every 8 (eight) hours as needed for anxiety.     methocarbamol 500 MG tablet  Commonly known as:  ROBAXIN  Take 1-2 tablets (500-1,000 mg total) by mouth every 6 (six) hours as needed for muscle spasms.     multivitamin with minerals Tabs tablet  Take 1 tablet by mouth daily.     ondansetron 4 MG tablet  Commonly known as:  ZOFRAN  Take 1 tab every 6 hours as needed for nausea     oxyCODONE-acetaminophen 10-325 MG per tablet  Commonly known as:  PERCOCET  Take 1 tablet by mouth every 6 (six) hours as needed for pain.     pantoprazole 40 MG tablet  Commonly known as:  PROTONIX  TAKE 1 TABLET BY MOUTH TWICE DAILY     PEDVAX HIB 7.5 MCG/0.5 ML Susp injection  Generic drug:  haemophilus B conjugate vaccine     predniSONE 10 MG tablet  Commonly known as:  STERAPRED UNI-PAK  Please follow the daily instructions that come with the medication package.     prochlorperazine 10 MG tablet  Commonly known as:  COMPAZINE  Take 1 tablet (10 mg total) by mouth every 6 (six) hours as needed.     rivaroxaban 20 MG Tabs tablet  Commonly known as:  XARELTO  Take by mouth.     tadalafil 20 MG tablet  Commonly known as:  CIALIS  Take 1 tablet (20 mg total) by mouth daily as needed for erectile dysfunction.     temazepam 15 MG capsule  Commonly known as:  RESTORIL  Take 1 capsule (15 mg total) by mouth at bedtime as needed.     traMADol 50 MG tablet  Commonly known as:  ULTRAM  Take 1-2 tablets (50-100 mg total) by mouth every 6 (six) hours as needed for severe pain.     ZOMETA 4 MG/5ML injection  Generic drug:  zolendronic acid  Inject 4 mg into the vein every 28 (twenty-eight) days.         PHYSICAL EXAMINATION  Oncology Vitals 11/04/2014 10/27/2014 10/15/2014 10/15/2014 10/13/2014 10/06/2014 09/10/2014  Height - - - 175 cm 175 cm - 175 cm  Weight 97.932 kg 97.977 kg - 96.616 kg 97.251 kg 97.886 kg  97.115 kg  Weight (lbs) 215 lbs 14 oz 216 lbs - 213 lbs 214 lbs 6 oz 215 lbs 13 oz 214  lbs 2 oz  BMI (kg/m2) - - - 31.45 kg/m2 31.66 kg/m2 - 31.62 kg/m2  Temp 97.9 - 98 98.5 - 97.5 97.6  Pulse 58 62 59 57 64 64 72  Resp 18 - - 18 - - 18  Resp (Historical as of 02/09/12) - - - - - - -  SpO2 98 96 - 100 - - -  BSA (m2) - - - 2.17 m2 2.18 m2 - 2.17 m2   BP Readings from Last 3 Encounters:  11/04/14 131/76  10/27/14 104/64  10/15/14 122/81    Physical Exam  Constitutional: He is oriented to person, place, and time and well-developed, well-nourished, and in no distress.  HENT:  Head: Normocephalic and atraumatic.  Eyes: Conjunctivae and EOM are normal. Pupils are equal, round, and reactive to light. Right eye exhibits no discharge. Left eye exhibits no discharge. No scleral icterus.  Neck: Normal range of motion. Neck supple.  Pulmonary/Chest: Effort normal. No respiratory distress.  Musculoskeletal: Normal range of motion. He exhibits edema. He exhibits no tenderness.  Bilateral knees with trace edema; but no obvious tenderness with palpation.  There is no erythema, warmth, or red streaks to knees.  Also, trace +1 peripheral edema to bilateral lower extremities from the knees down to the ankles only.  There is no erythema, warmth, tenderness, or red streaks to the sites.    Patient observed with full range of motion and ambulating with no difficulty.  Neurological: He is alert and oriented to person, place, and time. Gait normal.  Skin: Skin is warm and dry. No rash noted. No erythema. No pallor.  Psychiatric: Affect normal.  Nursing note and vitals reviewed.   LABORATORY DATA:. No visits with results within 3 Day(s) from this visit. Latest known visit with results is:  Appointment on 10/08/2014  Component Date Value Ref Range Status  . WBC 10/08/2014 2.6* 4.0 - 10.3 10e3/uL Final  . NEUT# 10/08/2014 1.1* 1.5 - 6.5 10e3/uL Final  . HGB 10/08/2014 13.0  13.0 - 17.1 g/dL Final  .  HCT 10/08/2014 38.6  38.4 - 49.9 % Final  . Platelets 10/08/2014 120* 140 - 400 10e3/uL Final  . MCV 10/08/2014 94.6  79.3 - 98.0 fL Final  . MCH 10/08/2014 31.9  27.2 - 33.4 pg Final  . MCHC 10/08/2014 33.7  32.0 - 36.0 g/dL Final  . RBC 10/08/2014 4.08* 4.20 - 5.82 10e6/uL Final  . RDW 10/08/2014 14.9* 11.0 - 14.6 % Final  . lymph# 10/08/2014 0.6* 0.9 - 3.3 10e3/uL Final  . MONO# 10/08/2014 0.3  0.1 - 0.9 10e3/uL Final  . Eosinophils Absolute 10/08/2014 0.4  0.0 - 0.5 10e3/uL Final  . Basophils Absolute 10/08/2014 0.1  0.0 - 0.1 10e3/uL Final  . NEUT% 10/08/2014 43.6  39.0 - 75.0 % Final  . LYMPH% 10/08/2014 24.1  14.0 - 49.0 % Final  . MONO% 10/08/2014 12.5  0.0 - 14.0 % Final  . EOS% 10/08/2014 14.4* 0.0 - 7.0 % Final  . BASO% 10/08/2014 5.4* 0.0 - 2.0 % Final  . nRBC 10/08/2014 0  0 - 0 % Final  . Sodium 10/08/2014 138  136 - 145 mEq/L Final  . Potassium 10/08/2014 3.9  3.5 - 5.1 mEq/L Final  . Chloride 10/08/2014 107  98 - 109 mEq/L Final  . CO2 10/08/2014 26  22 - 29 mEq/L Final  . Glucose 10/08/2014 99  70 - 140 mg/dl Final  . BUN 10/08/2014 9.5  7.0 - 26.0 mg/dL Final  .  Creatinine 10/08/2014 1.1  0.7 - 1.3 mg/dL Final  . Total Bilirubin 10/08/2014 0.93  0.20 - 1.20 mg/dL Final  . Alkaline Phosphatase 10/08/2014 45  40 - 150 U/L Final  . AST 10/08/2014 24  5 - 34 U/L Final  . ALT 10/08/2014 30  0 - 55 U/L Final  . Total Protein 10/08/2014 6.0* 6.4 - 8.3 g/dL Final  . Albumin 10/08/2014 3.3* 3.5 - 5.0 g/dL Final  . Calcium 10/08/2014 9.2  8.4 - 10.4 mg/dL Final  . Anion Gap 10/08/2014 5  3 - 11 mEq/L Final  . EGFR 10/08/2014 70* >90 ml/min/1.73 m2 Final   eGFR is calculated using the CKD-EPI Creatinine Equation (2009)  . LDH 10/08/2014 138  125 - 245 U/L Final  . Beta-2 Microglobulin 10/08/2014 2.40  <=2.51 mg/L Final  . Kappa free light chain 10/08/2014 4.39* 0.33 - 1.94 mg/dL Final  . Lambda Free Lght Chn 10/08/2014 2.83* 0.57 - 2.63 mg/dL Final  . Kappa:Lambda Ratio  10/08/2014 1.55  0.26 - 1.65 Final  . IgG (Immunoglobin G), Serum 10/08/2014 1070  650 - 1600 mg/dL Final  . IgA 10/08/2014 158  68 - 379 mg/dL Final  . IgM, Serum 10/08/2014 30* 41 - 251 mg/dL Final     RADIOGRAPHIC STUDIES: No results found.  ASSESSMENT/PLAN:    Multiple myeloma Patient was instructed to hold his Revlimid over the weekend for complaint of increased burning and tingling to his bilateral lower extremities from the knee to the ankle only.  Patient states that he did not take his Revlimid on Saturday or Sunday; but did take the Revlimid on Monday, 11/03/2014.  Patient also received his last Zometa infusion on 10/15/2014.  The decision has been made to only receive the Zometa on an every two-month basis.  He will next be due for his Zometa on 12/15/2014.  While Revlimid can cause some increased neuropathy and peripheral edema-patient was advised per Dr. Earlie Server to continue with the Revlimid as previously directed.  Will order diuretic on a when necessary basis to see if that helps.  Patient is scheduled to return on 11/12/2014 for labs and a follow-up visit.   Long term current use of anticoagulant therapy Patient has been diagnosed in the past with both a DVT and a pulmonary embolism.  He continues to take Xarelto as previously directed.     Peripheral edema Patient complaining of bilateral lower extremity burning and stinging from the knees down to the ankles for the past few weeks.  He has also noted some increased edema to his bilateral lower extremities as well.  He has a history of bilateral knee replacements; and he also reports that his knees have had some increased pain and edema as well.  He denies any known injury or trauma to his lower extremities.  On exam-patient does have trace +1 peripheral edema to his bilateral lower legs.  Patient was advised to elevate his legs above the level of his heart whenever possible.  Patient states he has no interest in trying  compression hose; since he remained so active outside on a daily basis.  Patient was prescribed Lasix 20 mg on a daily basis to take only when necessary for peripheral edema.  Also, patient was advised to call/return or go directly to the emergency for any worsening symptoms whatsoever.   Knee pain, bilateral Patient has a history of bilateral knee replacements in the past.  Patient states that his bilateral knees have been bothering him for the  past several weeks.  He is complaining of increased pain to his bilateral knees; as well as some edema to his knee areas.  He denies any known injury or trauma.  On exam-it does appear that there is some trace edema surrounding his bilateral knee joints.  He continues to ambulate with no difficulty whatsoever.  Will continue to monitor closely.   Patient stated understanding of all instructions; and was in agreement with this plan of care. The patient knows to call the clinic with any problems, questions or concerns.   This was a shared visit with Dr. [name] today.  Total time spent with patient was 25 minutes;  with greater than 75 percent of that time spent in face to face counseling regarding patient's symptoms,  and coordination of care and follow up.  Disclaimer: This note was dictated with voice recognition software. Similar sounding words can inadvertently be transcribed and may not be corrected upon review.   Drue Second, NP 11/04/2014   ADDENDUM:  Hematology/Oncology Attending:  I had a face to face encounter with the patient. I recommended his care plan. This is a very pleasant 74 years old white male with history of multiple myeloma currently on maintenance treatment with Revlimid and tolerating his treatment fairly well with no significant adverse effects. Over the last 1-2 weeks The patient has been complaining of increasing pain in his knees as well as a swelling of the lower extremities which on exam today was less than 1+  edema. I recommended for the patient to start treatment with Lasix 20 mg by mouth daily as needed for the swelling. He will continue with his current treatment with Revlimid as scheduled. He would come back for follow-up visit as previously scheduled for reevaluation and management of any adverse effect of his treatment. He was advised to call immediately if he has any concerning symptoms in the interval.  Disclaimer: This note was dictated with voice recognition software. Similar sounding words can inadvertently be transcribed and may be missed upon review. Eilleen Kempf., MD 11/08/2014

## 2014-11-05 ENCOUNTER — Telehealth: Payer: Self-pay | Admitting: Oncology

## 2014-11-05 ENCOUNTER — Telehealth: Payer: Self-pay | Admitting: *Deleted

## 2014-11-05 NOTE — Telephone Encounter (Signed)
Left a message for [name] regarding his hair loss from radiation.  Requested a return call.

## 2014-11-05 NOTE — Telephone Encounter (Signed)
Called Mr. Christmas back and advised him that per Dr. Sondra Come, his hair may grow back in 3-4 months.  Mr. Likes verbalized understanding and was very happy.

## 2014-11-05 NOTE — Telephone Encounter (Signed)
LM for pt to rtn call- calling to check on his swelling and check his status.

## 2014-11-06 ENCOUNTER — Telehealth: Payer: Self-pay | Admitting: *Deleted

## 2014-11-06 NOTE — Telephone Encounter (Signed)
Patient called and stated,"Yes,I am feeling much better today. Please pass this message on. No need to return my phone call."

## 2014-11-11 DIAGNOSIS — B029 Zoster without complications: Secondary | ICD-10-CM | POA: Diagnosis not present

## 2014-11-12 ENCOUNTER — Ambulatory Visit: Payer: Medicare Other

## 2014-11-12 ENCOUNTER — Encounter: Payer: Self-pay | Admitting: Internal Medicine

## 2014-11-12 ENCOUNTER — Telehealth: Payer: Self-pay | Admitting: Internal Medicine

## 2014-11-12 ENCOUNTER — Ambulatory Visit (HOSPITAL_BASED_OUTPATIENT_CLINIC_OR_DEPARTMENT_OTHER): Payer: Medicare Other | Admitting: Internal Medicine

## 2014-11-12 ENCOUNTER — Other Ambulatory Visit (HOSPITAL_BASED_OUTPATIENT_CLINIC_OR_DEPARTMENT_OTHER): Payer: Medicare Other

## 2014-11-12 VITALS — BP 118/80 | HR 62 | Temp 97.8°F | Resp 20 | Ht 69.0 in | Wt 215.4 lb

## 2014-11-12 DIAGNOSIS — I2699 Other pulmonary embolism without acute cor pulmonale: Secondary | ICD-10-CM | POA: Diagnosis not present

## 2014-11-12 DIAGNOSIS — C9000 Multiple myeloma not having achieved remission: Secondary | ICD-10-CM

## 2014-11-12 LAB — COMPREHENSIVE METABOLIC PANEL (CC13)
ALK PHOS: 49 U/L (ref 40–150)
ALT: 27 U/L (ref 0–55)
AST: 23 U/L (ref 5–34)
Albumin: 3.6 g/dL (ref 3.5–5.0)
Anion Gap: 10 mEq/L (ref 3–11)
BILIRUBIN TOTAL: 0.85 mg/dL (ref 0.20–1.20)
BUN: 12.1 mg/dL (ref 7.0–26.0)
CO2: 25 mEq/L (ref 22–29)
CREATININE: 1 mg/dL (ref 0.7–1.3)
Calcium: 8.7 mg/dL (ref 8.4–10.4)
Chloride: 107 mEq/L (ref 98–109)
EGFR: 75 mL/min/{1.73_m2} — ABNORMAL LOW (ref >90)
Glucose: 83 mg/dl (ref 70–140)
Potassium: 3.6 mEq/L (ref 3.5–5.1)
Sodium: 142 mEq/L (ref 136–145)
Total Protein: 6.3 g/dL — ABNORMAL LOW (ref 6.4–8.3)

## 2014-11-12 LAB — CBC WITH DIFFERENTIAL/PLATELET
BASO%: 7 % — AB (ref 0.0–2.0)
Basophils Absolute: 0.2 10*3/uL — ABNORMAL HIGH (ref 0.0–0.1)
EOS%: 12.9 % — ABNORMAL HIGH (ref 0.0–7.0)
Eosinophils Absolute: 0.4 10*3/uL (ref 0.0–0.5)
HCT: 39.7 % (ref 38.4–49.9)
HGB: 13.5 g/dL (ref 13.0–17.1)
LYMPH%: 25.8 % (ref 14.0–49.0)
MCH: 32.1 pg (ref 27.2–33.4)
MCHC: 34 g/dL (ref 32.0–36.0)
MCV: 94.5 fL (ref 79.3–98.0)
MONO#: 0.3 10*3/uL (ref 0.1–0.9)
MONO%: 12.5 % (ref 0.0–14.0)
NEUT#: 1.1 10*3/uL — ABNORMAL LOW (ref 1.5–6.5)
NEUT%: 41.8 % (ref 39.0–75.0)
NRBC: 0 % (ref 0–0)
PLATELETS: 137 10*3/uL — AB (ref 140–400)
RBC: 4.2 10*6/uL (ref 4.20–5.82)
RDW: 15.3 % — ABNORMAL HIGH (ref 11.0–14.6)
WBC: 2.7 10*3/uL — AB (ref 4.0–10.3)
lymph#: 0.7 10*3/uL — ABNORMAL LOW (ref 0.9–3.3)

## 2014-11-12 LAB — LACTATE DEHYDROGENASE (CC13): LDH: 140 U/L (ref 125–245)

## 2014-11-12 NOTE — Progress Notes (Signed)
[facility] Telephone:([phone]) [phone]   Fax:([phone]) [place], [place] [address]  DIAGNOSIS: Stage IIA multiple myeloma diagnosed in July of 2013.   PRIOR THERAPY:  1) Systemic chemotherapy with subcutaneous Velcade 1.3 mg/M2 on days 1, 4, 8 and 11 every 3 weeks in addition to Revlimid 25 mg by mouth daily for 14 days every 3 weeks and dexamethasone 40 mg on a weekly basis. Status post 4 planned cycles.  2) Systemic chemotherapy with subcutaneous Velcade 1.3 mg/M2 on days 1, 4, 8 and 11 every 3 weeks in addition to Revlimid 25 mg by mouth daily for 14 days every 3 weeks and dexamethasone 40 mg on a weekly basis. 4 more cycles are planned and he is status post 4 cycles of the second set of 4 cycles, now status post a total of 10 cycles. 3) Systemic chemotherapy with Carfilzomib 36 mg/M2 on days 1, 2, 8, 9, 15 and 16 in addition to cyclophosphamide 300 mg/M2 IV on days 1, 8 and 15 as well as dexamethasone on days 1, 8, 15 and 22 every 4 weeks. Status post 4 cycles. 4) Zometa 4 mg IV every month. 5) autologous peripheral blood stem cell transplant on 03/20/2013 at [facility]. 6) Consolidation chemotherapy with Carfilzomib 36 mg/M2 on days 1, 2, 8, 9, 15 and 16 every 4 weeks in addition to cyclophosphamide 300 mg/M2 on days 1, 8 and 15 as well as dexamethasone 40 mg on a weekly basis every 4 weeks. Status post 2 cycles. First cycle 06/11/2013. 7) Maintenance therapy with Revlimid 10 mg by mouth daily status post 3 months of treatment.   CURRENT THERAPY:  1) Maintenance therapy with Revlimid 15 mg by mouth daily started 11/09/2013. 2) Zometa 4 mg IV every 2 months. 3) Xarelto 20 mg by mouth daily for pulmonary embolism. 4) palliative radiotherapy to the lytic lesion in the skull under the care of Dr. Sondra Come   INTERVAL HISTORY: [name] 74 y.o. male returns to the clinic today for routine monthly  followup visit. The patient is feeling fine today with no specific complaints. He had swelling of the lower extremity more than a week ago and the patient was started on Lasix 20 mg by mouth daily as needed. The swelling has improved and the aching pain in the lower extremity also improved. He denied having any significant fatigue or weakness. He denied having any fever or chills. He has no nausea or vomiting.  He denied having any significant chest pain, shortness of breath, cough or hemoptysis. He is tolerating his treatment with Revlimid fairly well. He had repeat  and he is here for evaluation and discussion of his lab results.   MEDICAL HISTORY: Past Medical History  Diagnosis Date  . GERD (gastroesophageal reflux disease)   . Adenomatous polyp of colon 1996  . Arthritis     Osteoarthritis right knee, spine, wrist  . Benign prostatic hypertrophy     (Dr. Karsten Ro)  . History of chronic prostatitis   . History of melanoma     Back (Dr. Wilhemina Bonito); early melanoma R arm 03/2011  . Diverticulosis   . Arthritis of hand, right     Right thumb (Dr. Daylene Katayama)  . Degenerative disc disease   . Hemorrhoids   . Erosive esophagitis   . Back ache     r/o Multiple Myeloma  . Multiple myeloma 2013    skull  lytic lesions--treated with radiation 07/2014  . DVT (deep venous thrombosis) 01/2014    R calf  . Pulmonary embolism 01/2014  . Radiation 07/21/14-08/05/14    left occipital parietal skull 25 gray    ALLERGIES:  is allergic to penicillins.  MEDICATIONS:  Current Outpatient Prescriptions  Medication Sig Dispense Refill  . acetaminophen (TYLENOL) 500 MG tablet Take 1,000 mg by mouth every 8 (eight) hours as needed.    Marland Kitchen ascorbic acid (VITAMIN C) 500 MG tablet Take 500 mg by mouth daily.    . B Complex-C (B-COMPLEX WITH VITAMIN C) tablet Take 1 tablet by mouth daily.    . cholecalciferol (VITAMIN D) 1000 UNITS tablet Take 1,000 Units by mouth daily.    . fish oil-omega-3 fatty acids 1000 MG  capsule Take 1 g by mouth daily.     . furosemide (LASIX) 20 MG tablet Take 1 tablet (20 mg total) by mouth daily. 14 tablet 1  . HYDROcodone-acetaminophen (NORCO) 7.5-325 MG per tablet Take 1 tablet by mouth every 6 (six) hours as needed for moderate pain. 30 tablet 0  . lenalidomide (REVLIMID) 15 MG capsule Take 1 capsule (15 mg total) by mouth daily. 28 capsule 0  . LORazepam (ATIVAN) 1 MG tablet Take 1 tablet (1 mg total) by mouth every 8 (eight) hours as needed for anxiety. 30 tablet 0  . methocarbamol (ROBAXIN) 500 MG tablet Take 1-2 tablets (500-1,000 mg total) by mouth every 6 (six) hours as needed for muscle spasms. 60 tablet 0  . Multiple Vitamin (MULTIVITAMIN WITH MINERALS) TABS Take 1 tablet by mouth daily.    . ondansetron (ZOFRAN) 4 MG tablet Take 1 tab every 6 hours as needed for nausea 40 tablet 1  . oxyCODONE-acetaminophen (PERCOCET) 10-325 MG per tablet Take 1 tablet by mouth every 6 (six) hours as needed for pain. 60 tablet 0  . pantoprazole (PROTONIX) 40 MG tablet TAKE 1 TABLET BY MOUTH TWICE DAILY 60 tablet 0  . PEDVAX HIB 7.5 MCG/0.5ML SUSP injection     . predniSONE (STERAPRED UNI-PAK) 10 MG tablet Please follow the daily instructions that come with the medication package.    . Probiotic Product (ALIGN) 4 MG CAPS Take 1 capsule by mouth daily.    . prochlorperazine (COMPAZINE) 10 MG tablet Take 1 tablet (10 mg total) by mouth every 6 (six) hours as needed. 30 tablet 1  . rivaroxaban (XARELTO) 20 MG TABS tablet Take by mouth.    . tadalafil (CIALIS) 20 MG tablet Take 1 tablet (20 mg total) by mouth daily as needed for erectile dysfunction. 3 tablet 0  . temazepam (RESTORIL) 15 MG capsule Take 1 capsule (15 mg total) by mouth at bedtime as needed. 30 capsule 0  . traMADol (ULTRAM) 50 MG tablet Take 1-2 tablets (50-100 mg total) by mouth every 6 (six) hours as needed for severe pain. 30 tablet 1  . zolendronic acid (ZOMETA) 4 MG/5ML injection Inject 4 mg into the vein every 28  (twenty-eight) days.     No current facility-administered medications for this visit.   Facility-Administered Medications Ordered in Other Visits  Medication Dose Route Frequency Provider Last Rate Last Dose  . ondansetron (ZOFRAN) tablet 8 mg  8 mg Oral Once Curt Bears, MD        SURGICAL HISTORY:  Past Surgical History  Procedure Laterality Date  . Total knee replaced  2000    Left knee  . Appendectomy    . Colonoscopy  2009  adenomatous polyp  . Esophagogastroduodenoscopy  2009    mild inflammation at esophagogastric junction  . Excision of melanoma      back ( x3)  . Great toe surgery      bilateral  . Inguinal hernia repair      left, x 2  . Total knee arthroplasty  06/08/2011    Procedure: TOTAL KNEE ARTHROPLASTY;  Surgeon: Ninetta Lights, MD;  Location: [facility];  Service: Orthopedics;  Laterality: Right;  osteonics  . [place]  10/2012  . Bone marrow transplant  03/2013    Stem Cell    REVIEW OF SYSTEMS:  Constitutional: negative Eyes: negative Ears, nose, mouth, throat, and face: negative Respiratory: negative Cardiovascular: negative Gastrointestinal: negative Genitourinary:negative Integument/breast: negative Hematologic/lymphatic: negative Musculoskeletal:negative Neurological: negative Behavioral/Psych: negative Endocrine: negative Allergic/Immunologic: negative   PHYSICAL EXAMINATION: General appearance: alert, cooperative and no distress Head: Normocephalic, without obvious abnormality, atraumatic Neck: no adenopathy, no JVD, supple, symmetrical, trachea midline and thyroid not enlarged, symmetric, no tenderness/mass/nodules Lymph nodes: Cervical, supraclavicular, and axillary nodes normal. Resp: clear to auscultation bilaterally Back: symmetric, no curvature. ROM normal. No CVA tenderness. Cardio: regular rate and rhythm, S1, S2 normal, no murmur, click, rub or gallop GI: soft, non-tender; bowel sounds normal; no masses,  no  organomegaly Extremities: extremities normal, atraumatic, no cyanosis or edema Neurologic: Alert and oriented X 3, normal strength and tone. Normal symmetric reflexes. Normal coordination and gait  ECOG PERFORMANCE STATUS: 1 - Symptomatic but completely ambulatory  Blood pressure 118/80, pulse 62, temperature 97.8 F (36.6 C), temperature source Oral, resp. rate 20, height $RemoveBe'5\' 9"'HEOSZDpav$  (1.753 m), weight 215 lb 6.4 oz (97.705 kg), SpO2 98 %.  LABORATORY DATA: Lab Results  Component Value Date   WBC 2.7* 11/12/2014   HGB 13.5 11/12/2014   HCT 39.7 11/12/2014   MCV 94.5 11/12/2014   PLT 137* 11/12/2014      Chemistry      Component Value Date/Time   NA 138 10/08/2014 0759   NA 139 04/22/2014 1417   K 3.9 10/08/2014 0759   K 3.9 04/22/2014 1417   CL 107 04/22/2014 1417   CL 109* 12/31/2012 0759   CO2 26 10/08/2014 0759   CO2 22 04/22/2014 1417   BUN 9.5 10/08/2014 0759   BUN 10 04/22/2014 1417   CREATININE 1.1 10/08/2014 0759   CREATININE 1.1 04/22/2014 1417   CREATININE 0.97 11/18/2013 1045      Component Value Date/Time   CALCIUM 9.2 10/08/2014 0759   CALCIUM 8.8 04/22/2014 1417   ALKPHOS 45 10/08/2014 0759   ALKPHOS 47 04/22/2014 1417   AST 24 10/08/2014 0759   AST 18 04/22/2014 1417   ALT 30 10/08/2014 0759   ALT 22 04/22/2014 1417   BILITOT 0.93 10/08/2014 0759   BILITOT 0.8 04/22/2014 1417     Myeloma panel: Beta-2 microglobulin 2.83, free kappa light chain 3.12, free lambda light chain 2.65 and kappa/lambda ratio 1.18. IgG 1090, IgA 180 and IgM 30.  RADIOGRAPHIC STUDIES: No results found.  ASSESSMENT AND PLAN: This is a very pleasant 74 years old white male with history of multiple myeloma status post induction chemotherapy with Velcade, Revlimid and dexamethasone followed by treatment with Carfilzomib, cyclophosphamide and dexamethasone with significant improvement in his disease followed by autologous peripheral blood stem cell transplant at [facility] on  03/20/2013. This was followed by 2 cycles of consolidation chemotherapy with Carfilzomib, Cytoxan and dexamethasone, and he tolerated it fairly well. He completed maintenance Revlimid 10  mg by mouth daily for 3 months and tolerating it fairly well. He started maintenance Revlimid with 15 mg by mouth daily in May 2015 and tolerating it well. The recent myeloma panel showed no evidence for disease progression. I discussed the lab result with the patient today. He will continue treatment with Zometa every 2 months.  For the pulmonary embolism, he will continue on Xarelto 20 mg by mouth daily as scheduled.  He would come back for follow-up visit in one month for reevaluation with repeat blood work. He was advised to call immediately if he has any concerning symptoms in the interval. The patient voices understanding of current disease status and treatment options and is in agreement with the current care plan.  All questions were answered. The patient knows to call the clinic with any problems, questions or concerns. We can certainly see the patient much sooner if necessary.  Disclaimer: This note was dictated with voice recognition software. Similar sounding words can inadvertently be transcribed and may not be corrected upon review.

## 2014-11-12 NOTE — Telephone Encounter (Signed)
Gave patient avs report and appointments for June.  °

## 2014-11-13 ENCOUNTER — Telehealth: Payer: Self-pay | Admitting: Internal Medicine

## 2014-11-13 NOTE — Telephone Encounter (Signed)
pt called to r/s done...pt ok and aware

## 2014-11-21 ENCOUNTER — Telehealth: Payer: Self-pay

## 2014-11-21 ENCOUNTER — Other Ambulatory Visit: Payer: Self-pay | Admitting: *Deleted

## 2014-11-21 DIAGNOSIS — C9000 Multiple myeloma not having achieved remission: Secondary | ICD-10-CM

## 2014-11-21 MED ORDER — LENALIDOMIDE 15 MG PO CAPS
15.0000 mg | ORAL_CAPSULE | Freq: Every day | ORAL | Status: DC
Start: 2014-11-21 — End: 2014-12-17

## 2014-11-21 NOTE — Telephone Encounter (Signed)
revlimid refill request to Dr [name]

## 2014-11-21 NOTE — Telephone Encounter (Signed)
Revlimid '15mg'$  refilled Rx e-scribed and  faxed to [facility] [phone] Authorization# [id number]

## 2014-12-05 ENCOUNTER — Other Ambulatory Visit (HOSPITAL_BASED_OUTPATIENT_CLINIC_OR_DEPARTMENT_OTHER): Payer: Medicare Other

## 2014-12-05 ENCOUNTER — Ambulatory Visit (HOSPITAL_BASED_OUTPATIENT_CLINIC_OR_DEPARTMENT_OTHER): Payer: Medicare Other | Admitting: Physician Assistant

## 2014-12-05 ENCOUNTER — Ambulatory Visit (HOSPITAL_COMMUNITY)
Admission: RE | Admit: 2014-12-05 | Discharge: 2014-12-05 | Disposition: A | Discharge status: Home / Self Care | Payer: Medicare Other | Source: Ambulatory Visit | Attending: Physician Assistant | Admitting: Physician Assistant

## 2014-12-05 ENCOUNTER — Encounter: Payer: Self-pay | Admitting: Physician Assistant

## 2014-12-05 ENCOUNTER — Ambulatory Visit (HOSPITAL_BASED_OUTPATIENT_CLINIC_OR_DEPARTMENT_OTHER): Payer: Medicare Other

## 2014-12-05 ENCOUNTER — Telehealth: Payer: Self-pay | Admitting: *Deleted

## 2014-12-05 ENCOUNTER — Telehealth: Payer: Self-pay | Admitting: Internal Medicine

## 2014-12-05 VITALS — BP 115/68 | HR 75 | Temp 99.4°F | Resp 18 | Ht 69.0 in | Wt 214.6 lb

## 2014-12-05 DIAGNOSIS — R918 Other nonspecific abnormal finding of lung field: Secondary | ICD-10-CM | POA: Diagnosis not present

## 2014-12-05 DIAGNOSIS — R5383 Other fatigue: Secondary | ICD-10-CM

## 2014-12-05 DIAGNOSIS — R350 Frequency of micturition: Secondary | ICD-10-CM

## 2014-12-05 DIAGNOSIS — R509 Fever, unspecified: Secondary | ICD-10-CM | POA: Diagnosis not present

## 2014-12-05 DIAGNOSIS — C9000 Multiple myeloma not having achieved remission: Secondary | ICD-10-CM | POA: Diagnosis not present

## 2014-12-05 DIAGNOSIS — R0600 Dyspnea, unspecified: Secondary | ICD-10-CM | POA: Diagnosis not present

## 2014-12-05 DIAGNOSIS — R0609 Other forms of dyspnea: Secondary | ICD-10-CM | POA: Insufficient documentation

## 2014-12-05 DIAGNOSIS — R52 Pain, unspecified: Secondary | ICD-10-CM

## 2014-12-05 LAB — URINALYSIS, MICROSCOPIC - CHCC
Bilirubin (Urine): NEGATIVE
GLUCOSE UR CHCC: NEGATIVE mg/dL
Ketones: NEGATIVE mg/dL
Leukocyte Esterase: NEGATIVE
Nitrite: NEGATIVE
Protein: <30 mg/dL
Specific Gravity, Urine: 1.01 (ref 1.003–1.035)
Urobilinogen, UR: 0.2 mg/dL (ref 0.2–1)
pH: 6 (ref 4.6–8.0)

## 2014-12-05 LAB — COMPREHENSIVE METABOLIC PANEL (CC13)
ALT: 54 U/L (ref 0–55)
ANION GAP: 11 meq/L (ref 3–11)
AST: 55 U/L — ABNORMAL HIGH (ref 5–34)
Albumin: 3.3 g/dL — ABNORMAL LOW (ref 3.5–5.0)
Alkaline Phosphatase: 71 U/L (ref 40–150)
BUN: 12.1 mg/dL (ref 7.0–26.0)
CALCIUM: 8.5 mg/dL (ref 8.4–10.4)
CHLORIDE: 102 meq/L (ref 98–109)
CO2: 25 meq/L (ref 22–29)
CREATININE: 1.1 mg/dL (ref 0.7–1.3)
EGFR: 65 mL/min/{1.73_m2} — AB (ref >90)
GLUCOSE: 97 mg/dL (ref 70–140)
POTASSIUM: 3.4 meq/L — AB (ref 3.5–5.1)
SODIUM: 139 meq/L (ref 136–145)
Total Bilirubin: 1.59 mg/dL — ABNORMAL HIGH (ref 0.20–1.20)
Total Protein: 6.4 g/dL (ref 6.4–8.3)

## 2014-12-05 LAB — CBC WITH DIFFERENTIAL/PLATELET
BASO%: 3.4 % — AB (ref 0.0–2.0)
Basophils Absolute: 0.1 10*3/uL (ref 0.0–0.1)
EOS ABS: 0.1 10*3/uL (ref 0.0–0.5)
EOS%: 2.4 % (ref 0.0–7.0)
HCT: 37.6 % — ABNORMAL LOW (ref 38.4–49.9)
HGB: 12.7 g/dL — ABNORMAL LOW (ref 13.0–17.1)
LYMPH%: 15.9 % (ref 14.0–49.0)
MCH: 32.1 pg (ref 27.2–33.4)
MCHC: 33.7 g/dL (ref 32.0–36.0)
MCV: 95.2 fL (ref 79.3–98.0)
MONO#: 0.7 10*3/uL (ref 0.1–0.9)
MONO%: 16.6 % — ABNORMAL HIGH (ref 0.0–14.0)
NEUT%: 61.7 % (ref 39.0–75.0)
NEUTROS ABS: 2.5 10*3/uL (ref 1.5–6.5)
PLATELETS: 133 10*3/uL — AB (ref 140–400)
RBC: 3.95 10*6/uL — ABNORMAL LOW (ref 4.20–5.82)
RDW: 15.4 % — AB (ref 11.0–14.6)
WBC: 4.1 10*3/uL (ref 4.0–10.3)
lymph#: 0.7 10*3/uL — ABNORMAL LOW (ref 0.9–3.3)

## 2014-12-05 MED ORDER — CIPROFLOXACIN HCL 500 MG PO TABS: 500.0000 mg | ORAL_TABLET | Freq: Two times a day (BID) | ORAL | Status: DC

## 2014-12-05 MED ORDER — HYDROCODONE-ACETAMINOPHEN 7.5-325 MG PO TABS: 1.0000 | ORAL_TABLET | Freq: Four times a day (QID) | ORAL | Status: DC | PRN

## 2014-12-05 NOTE — Telephone Encounter (Addendum)
"  Last night I was freezing.  Having cold seats, weakness and fatigue.  Dr. [name] told me I need to watch my white blood cells because I'm on Revlimid.  I'm in [place] and come in anytime after 3:00 for lab and see [employer].  I walked today and had half the strength I usually have.  [facility] Sabin knows me as well and usually when this happens they tell me to forego the revlimid but I don't want to do that independently.  I don't have a fever."  Return number is [phone]

## 2014-12-05 NOTE — Telephone Encounter (Signed)
Verbal order received and read back from [facility] for lab and visit this afternoon.  Called Mr. [name] with appointment time for lab at 3:15 pm and see [name] after lab draw.  Urgent P.O.F. produced.

## 2014-12-05 NOTE — Telephone Encounter (Signed)
Added appt per pof...per orders pt aware

## 2014-12-05 NOTE — Progress Notes (Addendum)
[facility] [phone]   [phone]  OFFICE PROGRESS NOTE  Lavonda Jumbo, MD [address] [place] [address]  DIAGNOSIS: Stage IIA multiple myeloma diagnosed in July of 2013.   PRIOR THERAPY:  1) Systemic chemotherapy with subcutaneous Velcade 1.3 mg/M2 on days 1, 4, 8 and 11 every 3 weeks in addition to Revlimid 25 mg by mouth daily for 14 days every 3 weeks and dexamethasone 40 mg on a weekly basis. Status post 4 planned cycles.  2) Systemic chemotherapy with subcutaneous Velcade 1.3 mg/M2 on days 1, 4, 8 and 11 every 3 weeks in addition to Revlimid 25 mg by mouth daily for 14 days every 3 weeks and dexamethasone 40 mg on a weekly basis. 4 more cycles are planned and he is status post 4 cycles of the second set of 4 cycles, now status post a total of 10 cycles. 3) Systemic chemotherapy with Carfilzomib 36 mg/M2 on days 1, 2, 8, 9, 15 and 16 in addition to cyclophosphamide 300 mg/M2 IV on days 1, 8 and 15 as well as dexamethasone on days 1, 8, 15 and 22 every 4 weeks. Status post 4 cycles. 4) Zometa 4 mg IV every month. 5) autologous peripheral blood stem cell transplant on 03/20/2013 at [facility]. 6) Consolidation chemotherapy with Carfilzomib 36 mg/M2 on days 1, 2, 8, 9, 15 and 16 every 4 weeks in addition to cyclophosphamide 300 mg/M2 on days 1, 8 and 15 as well as dexamethasone 40 mg on a weekly basis every 4 weeks. Status post 2 cycles. First cycle 06/11/2013. 7) Maintenance therapy with Revlimid 10 mg by mouth daily status post 3 months of treatment.   CURRENT THERAPY:  1) Maintenance therapy with Revlimid 15 mg by mouth daily started 11/09/2013. 2) Zometa 4 mg IV every 2 months. 3) Xarelto 20 mg by mouth daily for pulmonary embolism. 4) palliative radiotherapy to the lytic lesion in the skull under the care of Dr. Roselind Messier   INTERVAL HISTORY: [name] 74 y.o. male returns to the clinic today for a work in visit  complaining of chills, night sweats, myalgias and arthalgias over the past 2 days. He also complains of having decreased stamina and increased shortness of breath.with exertion. He has no nausea or vomiting.  He denied having any significant chest pain, shortness of breath, cough or hemoptysis. He is tolerating his treatment with Revlimid fairly well.   MEDICAL HISTORY: Past Medical History  Diagnosis Date  . GERD (gastroesophageal reflux disease)   . Adenomatous polyp of colon 1996  . Arthritis     Osteoarthritis right knee, spine, wrist  . Benign prostatic hypertrophy     (Dr. Vernie Ammons)  . History of chronic prostatitis   . History of melanoma     Back (Dr. Karlyn Agee); early melanoma R arm 03/2011  . Diverticulosis   . Arthritis of hand, right     Right thumb (Dr. Teressa Senter)  . Degenerative disc disease   . Hemorrhoids   . Erosive esophagitis   . Back ache     r/o Multiple Myeloma  . Multiple myeloma 2013    skull lytic lesions--treated with radiation 07/2014  . DVT (deep venous thrombosis) 01/2014    R calf  . Pulmonary embolism 01/2014  . Radiation 07/21/14-08/05/14    left occipital parietal skull 25 gray    ALLERGIES:  is allergic to penicillins.  MEDICATIONS:  Current Outpatient Prescriptions  Medication Sig Dispense Refill  .  acetaminophen (TYLENOL) 500 MG tablet Take 1,000 mg by mouth every 8 (eight) hours as needed.    Marland Kitchen ascorbic acid (VITAMIN C) 500 MG tablet Take 500 mg by mouth daily.    . B Complex-C (B-COMPLEX WITH VITAMIN C) tablet Take 1 tablet by mouth daily.    . cholecalciferol (VITAMIN D) 1000 UNITS tablet Take 1,000 Units by mouth daily.    . fish oil-omega-3 fatty acids 1000 MG capsule Take 1 g by mouth daily.     . furosemide (LASIX) 20 MG tablet Take 1 tablet (20 mg total) by mouth daily. 14 tablet 1  . HYDROcodone-acetaminophen (NORCO) 7.5-325 MG per tablet Take 1 tablet by mouth every 6 (six) hours as needed for moderate pain. 30 tablet 0  . lenalidomide  (REVLIMID) 15 MG capsule Take 1 capsule (15 mg total) by mouth daily. 28 capsule 0  . LORazepam (ATIVAN) 1 MG tablet Take 1 tablet (1 mg total) by mouth every 8 (eight) hours as needed for anxiety. 30 tablet 0  . methocarbamol (ROBAXIN) 500 MG tablet Take 1-2 tablets (500-1,000 mg total) by mouth every 6 (six) hours as needed for muscle spasms. 60 tablet 0  . Multiple Vitamin (MULTIVITAMIN WITH MINERALS) TABS Take 1 tablet by mouth daily.    . ondansetron (ZOFRAN) 4 MG tablet Take 1 tab every 6 hours as needed for nausea 40 tablet 1  . oxyCODONE-acetaminophen (PERCOCET) 10-325 MG per tablet Take 1 tablet by mouth every 6 (six) hours as needed for pain. 60 tablet 0  . pantoprazole (PROTONIX) 40 MG tablet TAKE 1 TABLET BY MOUTH TWICE DAILY 60 tablet 0  . PEDVAX HIB 7.5 MCG/0.5ML SUSP injection     . predniSONE (STERAPRED UNI-PAK) 10 MG tablet Please follow the daily instructions that come with the medication package.    . Probiotic Product (ALIGN) 4 MG CAPS Take 1 capsule by mouth daily.    . prochlorperazine (COMPAZINE) 10 MG tablet Take 1 tablet (10 mg total) by mouth every 6 (six) hours as needed. 30 tablet 1  . rivaroxaban (XARELTO) 20 MG TABS tablet Take by mouth.    . tadalafil (CIALIS) 20 MG tablet Take 1 tablet (20 mg total) by mouth daily as needed for erectile dysfunction. 3 tablet 0  . temazepam (RESTORIL) 15 MG capsule Take 1 capsule (15 mg total) by mouth at bedtime as needed. 30 capsule 0  . traMADol (ULTRAM) 50 MG tablet Take 1-2 tablets (50-100 mg total) by mouth every 6 (six) hours as needed for severe pain. 30 tablet 1  . zolendronic acid (ZOMETA) 4 MG/5ML injection Inject 4 mg into the vein every 28 (twenty-eight) days.     No current facility-administered medications for this visit.   Facility-Administered Medications Ordered in Other Visits  Medication Dose Route Frequency Provider Last Rate Last Dose  . ondansetron (ZOFRAN) tablet 8 mg  8 mg Oral Once Curt Bears, MD          SURGICAL HISTORY:  Past Surgical History  Procedure Laterality Date  . Total knee replaced  2000    Left knee  . Appendectomy    . Colonoscopy  2009    adenomatous polyp  . Esophagogastroduodenoscopy  2009    mild inflammation at esophagogastric junction  . Excision of melanoma      back ( x3)  . Great toe surgery      bilateral  . Inguinal hernia repair      left, x 2  . Total knee  arthroplasty  06/08/2011    Procedure: TOTAL KNEE ARTHROPLASTY;  Surgeon: Ninetta Lights, MD;  Location: [place];  Service: Orthopedics;  Laterality: Right;  osteonics  . [place]  10/2012  . Bone marrow transplant  03/2013    Stem Cell    REVIEW OF SYSTEMS:  Constitutional: positive for chills, fevers, malaise and night sweats Eyes: negative Ears, nose, mouth, throat, and face: negative Respiratory: positive for dyspnea on exertion and increased recently Cardiovascular: negative Gastrointestinal: negative Genitourinary:negative Integument/breast: negative Hematologic/lymphatic: negative Musculoskeletal:negative Neurological: negative Behavioral/Psych: negative Endocrine: negative Allergic/Immunologic: negative   PHYSICAL EXAMINATION: General appearance: alert, cooperative, flushed and no distress Head: Normocephalic, without obvious abnormality, atraumatic Neck: no adenopathy, no JVD, supple, symmetrical, trachea midline and thyroid not enlarged, symmetric, no tenderness/mass/nodules Lymph nodes: Cervical, supraclavicular, and axillary nodes normal. Resp: clear to auscultation bilaterally Back: symmetric, no curvature. ROM normal. No CVA tenderness. Cardio: regular rate and rhythm, S1, S2 normal, no murmur, click, rub or gallop GI: soft, non-tender; bowel sounds normal; no masses,  no organomegaly Extremities: extremities normal, atraumatic, no cyanosis or edema Neurologic: Alert and oriented X 3, normal strength and tone. Normal symmetric reflexes. Normal coordination and  gait  ECOG PERFORMANCE STATUS: 1 - Symptomatic but completely ambulatory  Blood pressure 115/68, pulse 75, temperature 99.4 F (37.4 C), temperature source Oral, resp. rate 18, height $RemoveBe'5\' 9"'oPNNHqRxm$  (1.753 m), weight 214 lb 9.6 oz (97.342 kg), SpO2 96 %.  LABORATORY DATA: Lab Results  Component Value Date   WBC 4.1 12/05/2014   HGB 12.7* 12/05/2014   HCT 37.6* 12/05/2014   MCV 95.2 12/05/2014   PLT 133* 12/05/2014      Chemistry      Component Value Date/Time   NA 139 12/05/2014 1445   NA 139 04/22/2014 1417   K 3.4* 12/05/2014 1445   K 3.9 04/22/2014 1417   CL 107 04/22/2014 1417   CL 109* 12/31/2012 0759   CO2 25 12/05/2014 1445   CO2 22 04/22/2014 1417   BUN 12.1 12/05/2014 1445   BUN 10 04/22/2014 1417   CREATININE 1.1 12/05/2014 1445   CREATININE 1.1 04/22/2014 1417   CREATININE 0.97 11/18/2013 1045      Component Value Date/Time   CALCIUM 8.5 12/05/2014 1445   CALCIUM 8.8 04/22/2014 1417   ALKPHOS 71 12/05/2014 1445   ALKPHOS 47 04/22/2014 1417   AST 55* 12/05/2014 1445   AST 18 04/22/2014 1417   ALT 54 12/05/2014 1445   ALT 22 04/22/2014 1417   BILITOT 1.59* 12/05/2014 1445   BILITOT 0.8 04/22/2014 1417     Myeloma panel: Beta-2 microglobulin 2.83, free kappa light chain 3.12, free lambda light chain 2.65 and kappa/lambda ratio 1.18. IgG 1090, IgA 180 and IgM 30.  RADIOGRAPHIC STUDIES: Dg Chest 2 View  12/05/2014   CLINICAL DATA:  Increased dyspnea on exertion.  History of smoking.  EXAM: CHEST  2 VIEW  COMPARISON:  10/01/2013  FINDINGS: Cardiac silhouette normal in size and configuration. The aorta is uncoiled. No mediastinal or hilar masses or evidence of adenopathy.  Mild reticular opacity at the lung bases likely combination of subsegmental atelectasis and scarring. This is similar to the prior exam. No lung consolidation or edema. No pleural effusion or pneumothorax.  Right anterior chest wall power Port-A-Cath is stable with its catheter tip near the caval  atrial junction.  Bony thorax is demineralized but grossly intact.  IMPRESSION: No acute cardiopulmonary disease.   Electronically Signed   By: Lajean Manes  M.D.   On: 12/05/2014 16:07    ASSESSMENT AND PLAN: This is a very pleasant 74 years old white male with history of multiple myeloma status post induction chemotherapy with Velcade, Revlimid and dexamethasone followed by treatment with Carfilzomib, cyclophosphamide and dexamethasone with significant improvement in his disease followed by autologous peripheral blood stem cell transplant at [facility] on 03/20/2013. This was followed by 2 cycles of consolidation chemotherapy with Carfilzomib, Cytoxan and dexamethasone, and he tolerated it fairly well. He completed maintenance Revlimid 10 mg by mouth daily for 3 months and tolerating it fairly well. He started maintenance Revlimid with 15 mg by mouth daily in May 2015 and tolerating it well. The recent myeloma panel showed no evidence for disease progression.  He will continue treatment with Zometa every 2 months.  For the pulmonary embolism, he will continue on Xarelto 20 mg by mouth daily as scheduled.   His current symptom complex is concerning and he currently is presenting with a low grade fever. CBC differential and CMET are within normal ranges. I will check a urinalysis and urine C&S as well as a chest x-ray.  The patient was discussed with and also seen by Dr. [name]. We will empirically place him on a course of Cipro. He is to follow up as previously scheduled or sooner if needed.  He was advised to call immediately if he has any concerning symptoms in the interval. The patient voices understanding of current disease status and treatment options and is in agreement with the current care plan.  All questions were answered. The patient knows to call the clinic with any problems, questions or concerns. We can certainly see the patient much sooner if necessary.  Carlton Adam,  PA-C 12/05/2014   ADDENDUM: Hematology/Oncology Attending: I had a face to face encounter with the patient. I recommended his care plan. This is a very pleasant 74 years old white male with history of multiple myeloma who is currently on maintenance treatment with Revlimid and tolerating his treatment fairly well. The patient came to the clinic today complaining of increasing fatigue and weakness as well as aching pain all over his body in addition to mild shortness of breath and chills and low-grade fever. He is concerned about recurrence of his multiple myeloma. I ordered several studies on the patient today including urinalysis as well as chest x-ray. We'll start the patient empirically on Cipro 500 mg by mouth twice a day. His last myeloma panel was unremarkable for any disease progression. I assured the patient and we will continue to monitor him closely and he is scheduled for repeat myeloma panel next month.  He was advised to call immediately if he has any other concerning symptoms in the interval.   Disclaimer: This note was dictated with voice recognition software. Similar sounding words can inadvertently be transcribed and may be missed upon review. Eilleen Kempf., MD 12/09/2014

## 2014-12-07 LAB — URINE CULTURE

## 2014-12-08 NOTE — Patient Instructions (Signed)
Take the antibiotics as prescribed Follow up as previously scheduled or sooner if needed

## 2014-12-10 ENCOUNTER — Other Ambulatory Visit: Payer: Self-pay | Admitting: Internal Medicine

## 2014-12-11 ENCOUNTER — Ambulatory Visit: Payer: Medicare Other | Admitting: Physician Assistant

## 2014-12-11 ENCOUNTER — Other Ambulatory Visit: Payer: Medicare Other

## 2014-12-17 ENCOUNTER — Telehealth: Payer: Self-pay | Admitting: *Deleted

## 2014-12-17 ENCOUNTER — Other Ambulatory Visit: Payer: Self-pay | Admitting: Medical Oncology

## 2014-12-17 DIAGNOSIS — C9000 Multiple myeloma not having achieved remission: Secondary | ICD-10-CM

## 2014-12-17 MED ORDER — LENALIDOMIDE 15 MG PO CAPS: 15.0000 mg | ORAL_CAPSULE | Freq: Every day | ORAL | Status: DC

## 2014-12-17 NOTE — Telephone Encounter (Signed)
[facility] faxed Revlimid refill request.  Request to provider's desk/in-basket for review.

## 2014-12-21 ENCOUNTER — Encounter: Payer: Self-pay | Admitting: Family Medicine

## 2014-12-22 ENCOUNTER — Other Ambulatory Visit: Payer: Self-pay | Admitting: Family Medicine

## 2014-12-22 DIAGNOSIS — N529 Male erectile dysfunction, unspecified: Secondary | ICD-10-CM

## 2014-12-22 MED ORDER — TADALAFIL 20 MG PO TABS: 20.0000 mg | ORAL_TABLET | Freq: Every day | ORAL | Status: DC | PRN

## 2014-12-23 ENCOUNTER — Encounter: Payer: Self-pay | Admitting: Physician Assistant

## 2014-12-23 ENCOUNTER — Other Ambulatory Visit (HOSPITAL_BASED_OUTPATIENT_CLINIC_OR_DEPARTMENT_OTHER): Payer: Medicare Other

## 2014-12-23 ENCOUNTER — Telehealth: Payer: Self-pay | Admitting: Internal Medicine

## 2014-12-23 ENCOUNTER — Ambulatory Visit (HOSPITAL_BASED_OUTPATIENT_CLINIC_OR_DEPARTMENT_OTHER): Payer: Medicare Other | Admitting: Physician Assistant

## 2014-12-23 VITALS — BP 102/70 | HR 60 | Temp 98.6°F | Resp 18 | Ht 69.0 in | Wt 213.0 lb

## 2014-12-23 DIAGNOSIS — R197 Diarrhea, unspecified: Secondary | ICD-10-CM | POA: Diagnosis not present

## 2014-12-23 DIAGNOSIS — C9000 Multiple myeloma not having achieved remission: Secondary | ICD-10-CM | POA: Diagnosis not present

## 2014-12-23 DIAGNOSIS — Z86711 Personal history of pulmonary embolism: Secondary | ICD-10-CM | POA: Diagnosis not present

## 2014-12-23 LAB — CBC WITH DIFFERENTIAL/PLATELET
BASO%: 9.1 % — ABNORMAL HIGH (ref 0.0–2.0)
Basophils Absolute: 0.3 10*3/uL — ABNORMAL HIGH (ref 0.0–0.1)
EOS ABS: 0.3 10*3/uL (ref 0.0–0.5)
EOS%: 10.7 % — ABNORMAL HIGH (ref 0.0–7.0)
HEMATOCRIT: 39.1 % (ref 38.4–49.9)
HGB: 13.1 g/dL (ref 13.0–17.1)
LYMPH%: 24.6 % (ref 14.0–49.0)
MCH: 31.9 pg (ref 27.2–33.4)
MCHC: 33.5 g/dL (ref 32.0–36.0)
MCV: 95 fL (ref 79.3–98.0)
MONO#: 0.3 10*3/uL (ref 0.1–0.9)
MONO%: 10.7 % (ref 0.0–14.0)
NEUT%: 44.9 % (ref 39.0–75.0)
NEUTROS ABS: 1.3 10*3/uL — AB (ref 1.5–6.5)
PLATELETS: 175 10*3/uL (ref 140–400)
RBC: 4.11 10*6/uL — ABNORMAL LOW (ref 4.20–5.82)
RDW: 15.4 % — ABNORMAL HIGH (ref 11.0–14.6)
WBC: 2.8 10*3/uL — AB (ref 4.0–10.3)
lymph#: 0.7 10*3/uL — ABNORMAL LOW (ref 0.9–3.3)

## 2014-12-23 LAB — COMPREHENSIVE METABOLIC PANEL (CC13)
ALK PHOS: 55 U/L (ref 40–150)
ALT: 22 U/L (ref 0–55)
ANION GAP: 10 meq/L (ref 3–11)
AST: 19 U/L (ref 5–34)
Albumin: 3.3 g/dL — ABNORMAL LOW (ref 3.5–5.0)
BUN: 11.9 mg/dL (ref 7.0–26.0)
CO2: 27 mEq/L (ref 22–29)
Calcium: 8.7 mg/dL (ref 8.4–10.4)
Chloride: 104 mEq/L (ref 98–109)
Creatinine: 1 mg/dL (ref 0.7–1.3)
EGFR: 76 mL/min/{1.73_m2} — AB (ref >90)
GLUCOSE: 109 mg/dL (ref 70–140)
Potassium: 3.7 mEq/L (ref 3.5–5.1)
SODIUM: 141 meq/L (ref 136–145)
TOTAL PROTEIN: 6.3 g/dL — AB (ref 6.4–8.3)
Total Bilirubin: 0.61 mg/dL (ref 0.20–1.20)

## 2014-12-23 LAB — LACTATE DEHYDROGENASE (CC13): LDH: 125 U/L (ref 125–245)

## 2014-12-23 MED ORDER — TEMAZEPAM 15 MG PO CAPS: 15.0000 mg | ORAL_CAPSULE | Freq: Every evening | ORAL | Status: DC | PRN

## 2014-12-23 NOTE — Progress Notes (Addendum)
[facility] [phone]   Fax:([phone]) [place], [place] [address]  DIAGNOSIS: Stage IIA multiple myeloma diagnosed in July of 2013.   PRIOR THERAPY:  1) Systemic chemotherapy with subcutaneous Velcade 1.3 mg/M2 on days 1, 4, 8 and 11 every 3 weeks in addition to Revlimid 25 mg by mouth daily for 14 days every 3 weeks and dexamethasone 40 mg on a weekly basis. Status post 4 planned cycles.  2) Systemic chemotherapy with subcutaneous Velcade 1.3 mg/M2 on days 1, 4, 8 and 11 every 3 weeks in addition to Revlimid 25 mg by mouth daily for 14 days every 3 weeks and dexamethasone 40 mg on a weekly basis. 4 more cycles are planned and he is status post 4 cycles of the second set of 4 cycles, now status post a total of 10 cycles. 3) Systemic chemotherapy with Carfilzomib 36 mg/M2 on days 1, 2, 8, 9, 15 and 16 in addition to cyclophosphamide 300 mg/M2 IV on days 1, 8 and 15 as well as dexamethasone on days 1, 8, 15 and 22 every 4 weeks. Status post 4 cycles. 4) Zometa 4 mg IV every month. 5) Autologous peripheral blood stem cell transplant on 03/20/2013 at [facility]. 6) Consolidation chemotherapy with Carfilzomib 36 mg/M2 on days 1, 2, 8, 9, 15 and 16 every 4 weeks in addition to cyclophosphamide 300 mg/M2 on days 1, 8 and 15 as well as dexamethasone 40 mg on a weekly basis every 4 weeks. Status post 2 cycles. First cycle 06/11/2013. 7) Maintenance therapy with Revlimid 10 mg by mouth daily status post 3 months of treatment. 8) Palliative radiotherapy to the lytic lesion in the skull under the care of Dr. Sondra Come   CURRENT THERAPY:  1) Maintenance therapy with Revlimid 15 mg by mouth daily started 11/09/2013. 2) Zometa 4 mg IV every 2 months. 3) Xarelto 20 mg by mouth daily for pulmonary embolism.     INTERVAL HISTORY: [name] 74 y.o. male returns to the clinic today for a scheduled  follow-up visit. He reports that he completed his course of antibody aches for his illness. He states that he felt bad for approximately 4 days. His chest x-ray and his urinalysis and urine culture  were all negative. He's had no residual symptomatology. He reports that he has just had his 74th birthday and today is his and his wife's 53rd wedding anniversary. He continues to tolerate his Revlimid without difficulty. He does report recent issues with erectile dysfunction. He has discussed this with his primary care physician and has a prescription for Cialis. Today he reports diarrhea. He is having approximately 3 episodes per day this is sometimes alternating with constipation. He has not noted any blood in his stool or the diarrhea. He has no nausea or vomiting. Today he feels well and denies any episodes of diarrhea. He denied having any significant chest pain, shortness of breath, cough or hemoptysis. His last protein studies were done in March 2016 and he last received Zometa in April 2016.  MEDICAL HISTORY: Past Medical History  Diagnosis Date  . GERD (gastroesophageal reflux disease)   . Adenomatous polyp of colon 1996  . Arthritis     Osteoarthritis right knee, spine, wrist  . Benign prostatic hypertrophy     (Dr. Karsten Ro)  . History of chronic prostatitis   . History of melanoma     Back (Dr. Linna Hoff  Jones); early melanoma R arm 03/2011  . Diverticulosis   . Arthritis of hand, right     Right thumb (Dr. Daylene Katayama)  . Degenerative disc disease   . Hemorrhoids   . Erosive esophagitis   . Back ache     r/o Multiple Myeloma  . Multiple myeloma 2013    skull lytic lesions--treated with radiation 07/2014  . DVT (deep venous thrombosis) 01/2014    R calf  . Pulmonary embolism 01/2014  . Radiation 07/21/14-08/05/14    left occipital parietal skull 25 gray    ALLERGIES:  is allergic to penicillins.  MEDICATIONS:  Current Outpatient Prescriptions  Medication Sig Dispense Refill  . acetaminophen  (TYLENOL) 500 MG tablet Take 1,000 mg by mouth every 8 (eight) hours as needed.    Marland Kitchen ascorbic acid (VITAMIN C) 500 MG tablet Take 500 mg by mouth daily.    . B Complex-C (B-COMPLEX WITH VITAMIN C) tablet Take 1 tablet by mouth daily.    . cholecalciferol (VITAMIN D) 1000 UNITS tablet Take 1,000 Units by mouth daily.    . fish oil-omega-3 fatty acids 1000 MG capsule Take 1 g by mouth daily.     . furosemide (LASIX) 20 MG tablet Take 1 tablet (20 mg total) by mouth daily. 14 tablet 1  . HYDROcodone-acetaminophen (NORCO) 7.5-325 MG per tablet Take 1 tablet by mouth every 6 (six) hours as needed for moderate pain. 30 tablet 0  . lenalidomide (REVLIMID) 15 MG capsule Take 1 capsule (15 mg total) by mouth daily. 28 capsule 0  . LORazepam (ATIVAN) 1 MG tablet Take 1 tablet (1 mg total) by mouth every 8 (eight) hours as needed for anxiety. 30 tablet 0  . methocarbamol (ROBAXIN) 500 MG tablet Take 1-2 tablets (500-1,000 mg total) by mouth every 6 (six) hours as needed for muscle spasms. 60 tablet 0  . Multiple Vitamin (MULTIVITAMIN WITH MINERALS) TABS Take 1 tablet by mouth daily.    . ondansetron (ZOFRAN) 4 MG tablet Take 1 tab every 6 hours as needed for nausea 40 tablet 1  . oxyCODONE-acetaminophen (PERCOCET) 10-325 MG per tablet Take 1 tablet by mouth every 6 (six) hours as needed for pain. 60 tablet 0  . pantoprazole (PROTONIX) 40 MG tablet TAKE 1 TABLET BY MOUTH TWICE DAILY 60 tablet 0  . Probiotic Product (ALIGN) 4 MG CAPS Take 1 capsule by mouth daily.    . prochlorperazine (COMPAZINE) 10 MG tablet Take 1 tablet (10 mg total) by mouth every 6 (six) hours as needed. 30 tablet 1  . rivaroxaban (XARELTO) 20 MG TABS tablet Take by mouth.    . tadalafil (CIALIS) 20 MG tablet Take 1 tablet (20 mg total) by mouth daily as needed for erectile dysfunction. 6 tablet 11  . temazepam (RESTORIL) 15 MG capsule Take 1 capsule (15 mg total) by mouth at bedtime as needed. 30 capsule 0  . traMADol (ULTRAM) 50 MG  tablet Take 1-2 tablets (50-100 mg total) by mouth every 6 (six) hours as needed for severe pain. 30 tablet 1  . zolendronic acid (ZOMETA) 4 MG/5ML injection Inject 4 mg into the vein every 28 (twenty-eight) days.    Marland Kitchen PEDVAX HIB 7.5 MCG/0.5ML SUSP injection     . predniSONE (STERAPRED UNI-PAK) 10 MG tablet Please follow the daily instructions that come with the medication package.     No current facility-administered medications for this visit.   Facility-Administered Medications Ordered in Other Visits  Medication Dose Route Frequency Provider Last Rate  Last Dose  . ondansetron (ZOFRAN) tablet 8 mg  8 mg Oral Once Curt Bears, MD        SURGICAL HISTORY:  Past Surgical History  Procedure Laterality Date  . Total knee replaced  2000    Left knee  . Appendectomy    . Colonoscopy  2009    adenomatous polyp  . Esophagogastroduodenoscopy  2009    mild inflammation at esophagogastric junction  . Excision of melanoma      back ( x3)  . Great toe surgery      bilateral  . Inguinal hernia repair      left, x 2  . Total knee arthroplasty  06/08/2011    Procedure: TOTAL KNEE ARTHROPLASTY;  Surgeon: Ninetta Lights, MD;  Location: [facility];  Service: Orthopedics;  Laterality: Right;  osteonics  . [place]  10/2012  . Bone marrow transplant  03/2013    Stem Cell    REVIEW OF SYSTEMS:  Constitutional: negative Eyes: negative Ears, nose, mouth, throat, and face: negative Respiratory: negative Cardiovascular: negative Gastrointestinal: negative Genitourinary:positive for sexual problems Integument/breast: negative Hematologic/lymphatic: negative Musculoskeletal:negative Neurological: negative Behavioral/Psych: negative Endocrine: negative Allergic/Immunologic: negative   PHYSICAL EXAMINATION: General appearance: alert, cooperative, flushed and no distress Head: Normocephalic, without obvious abnormality, atraumatic Neck: no adenopathy, no JVD, supple, symmetrical, trachea  midline and thyroid not enlarged, symmetric, no tenderness/mass/nodules Lymph nodes: Cervical, supraclavicular, and axillary nodes normal. Resp: clear to auscultation bilaterally Back: symmetric, no curvature. ROM normal. No CVA tenderness. Cardio: regular rate and rhythm, S1, S2 normal, no murmur, click, rub or gallop GI: soft, non-tender; bowel sounds normal; no masses,  no organomegaly Extremities: extremities normal, atraumatic, no cyanosis or edema Neurologic: Alert and oriented X 3, normal strength and tone. Normal symmetric reflexes. Normal coordination and gait  ECOG PERFORMANCE STATUS: 1 - Symptomatic but completely ambulatory  Blood pressure 102/70, pulse 60, temperature 98.6 F (37 C), temperature source Oral, resp. rate 18, height $RemoveBe'5\' 9"'yZjlwLTly$  (1.753 m), weight 213 lb (96.616 kg), SpO2 98 %.  LABORATORY DATA: Lab Results  Component Value Date   WBC 2.8* 12/23/2014   HGB 13.1 12/23/2014   HCT 39.1 12/23/2014   MCV 95.0 12/23/2014   PLT 175 12/23/2014      Chemistry      Component Value Date/Time   NA 141 12/23/2014 0828   NA 139 04/22/2014 1417   K 3.7 12/23/2014 0828   K 3.9 04/22/2014 1417   CL 107 04/22/2014 1417   CL 109* 12/31/2012 0759   CO2 27 12/23/2014 0828   CO2 22 04/22/2014 1417   BUN 11.9 12/23/2014 0828   BUN 10 04/22/2014 1417   CREATININE 1.0 12/23/2014 0828   CREATININE 1.1 04/22/2014 1417   CREATININE 0.97 11/18/2013 1045      Component Value Date/Time   CALCIUM 8.7 12/23/2014 0828   CALCIUM 8.8 04/22/2014 1417   ALKPHOS 55 12/23/2014 0828   ALKPHOS 47 04/22/2014 1417   AST 19 12/23/2014 0828   AST 18 04/22/2014 1417   ALT 22 12/23/2014 0828   ALT 22 04/22/2014 1417   BILITOT 0.61 12/23/2014 0828   BILITOT 0.8 04/22/2014 1417     Myeloma panel: Beta-2 microglobulin 2.83, free kappa light chain 3.12, free lambda light chain 2.65 and kappa/lambda ratio 1.18. IgG 1090, IgA 180 and IgM 30.  RADIOGRAPHIC STUDIES: Dg Chest 2 View  12/05/2014    CLINICAL DATA:  Increased dyspnea on exertion.  History of smoking.  EXAM: CHEST  2 VIEW  COMPARISON:  10/01/2013  FINDINGS: Cardiac silhouette normal in size and configuration. The aorta is uncoiled. No mediastinal or hilar masses or evidence of adenopathy.  Mild reticular opacity at the lung bases likely combination of subsegmental atelectasis and scarring. This is similar to the prior exam. No lung consolidation or edema. No pleural effusion or pneumothorax.  Right anterior chest wall power Port-A-Cath is stable with its catheter tip near the caval atrial junction.  Bony thorax is demineralized but grossly intact.  IMPRESSION: No acute cardiopulmonary disease.   Electronically Signed   By: Lajean Manes M.D.   On: 12/05/2014 16:07    ASSESSMENT AND PLAN: This is a very pleasant 74 years old white male with history of multiple myeloma status post induction chemotherapy with Velcade, Revlimid and dexamethasone followed by treatment with Carfilzomib, cyclophosphamide and dexamethasone with significant improvement in his disease followed by autologous peripheral blood stem cell transplant at [facility] on 03/20/2013. This was followed by 2 cycles of consolidation chemotherapy with Carfilzomib, Cytoxan and dexamethasone, and he tolerated it fairly well. He completed maintenance Revlimid 10 mg by mouth daily for 3 months and tolerating it fairly well. He started maintenance Revlimid with 15 mg by mouth daily in May 2015 and is tolerating it well. The last myeloma panel showed no evidence for disease progression. Patient was discussed with and also seen by Dr. [name]. He will continue on maintenance Revlimid at 15 mg by mouth daily. The episodes of diarrhea are self-limiting or are well managed with Imodium. He will follow-up in one month with repeat protein studies to reevaluate his disease. He will also receive receive Zometa infusion when he returns in one month. This will again 3 months since his last  Zometa infusion.   For the pulmonary embolism, he will continue on Xarelto 20 mg by mouth daily as scheduled.   He was advised to call immediately if he has any concerning symptoms in the interval. The patient voices understanding of current disease status and treatment options and is in agreement with the current care plan.  All questions were answered. The patient knows to call the clinic with any problems, questions or concerns. We can certainly see the patient much sooner if necessary.  Carlton Adam, PA-C 12/23/2014  ADDENDUM: Hematology/Oncology Attending: I had a face to face encounter with the patient. I recommended his care plan. This is a very pleasant 74 years old white male with history of multiple myeloma and currently on maintenance treatment with Revlimid 15 mg by mouth daily and tolerating his treatment fairly well with no significant adverse effects except for occasional diarrhea recently. He was advised to use Imodium for diarrhea as needed. I recommended for the patient to continue his treatment as scheduled. The patient will also continue his treatment with Zometa every 3 months as a scheduled. For the history of pulmonary embolism he will continue on Xarelto 20 mg by mouth daily. I will see the patient back for follow-up visit in one month's for reevaluation after repeating myeloma panel. He was advised to call immediately if he has any concerning symptoms in the interval.  Disclaimer: This note was dictated with voice recognition software. Similar sounding words can inadvertently be transcribed and may not be corrected upon review. Eilleen Kempf., MD 12/24/2014

## 2014-12-23 NOTE — Telephone Encounter (Signed)
Gave and printed appt sched and avs for pt for JUly

## 2014-12-24 NOTE — Patient Instructions (Signed)
Continue Revlimid at the current dose Follow-up in 1 month for repeat protein studies to reevaluate your disease. He will receive Zometa infusion when you return in one month

## 2014-12-29 ENCOUNTER — Telehealth: Payer: Self-pay

## 2014-12-29 NOTE — Telephone Encounter (Signed)
Incoming fax requesting clarification of strength of revlimid to Dr [employer] desk

## 2015-01-12 ENCOUNTER — Other Ambulatory Visit: Payer: Self-pay | Admitting: Internal Medicine

## 2015-01-13 ENCOUNTER — Telehealth: Payer: Self-pay | Admitting: *Deleted

## 2015-01-13 NOTE — Telephone Encounter (Signed)
Received triage VM from [facility] regarding reduction in revlimid from '15mg'$  to '10mg'$ ; per Dr. [name] and Armanda Magic RN, this dose is correct; prescription to be filled as ordered at '10mg'$ .

## 2015-01-14 NOTE — Telephone Encounter (Signed)
"  I received Revlimid 10 mg today.  I have 39 tablets of Revlimid 15 mg.  Do I take the 15 mg and then start the 10 mg?  Has the Xarelto dose been decreased as well?"  Will notify Dr. [name].  return number: [phone].

## 2015-01-14 NOTE — Telephone Encounter (Signed)
Finish the 15 mg before switching to the 10 mg.

## 2015-01-14 NOTE — Telephone Encounter (Signed)
Called Mr. [name] with this information.  He will complete the 15 mg before switching to the Revlimid 10 mg.

## 2015-01-20 ENCOUNTER — Other Ambulatory Visit (HOSPITAL_BASED_OUTPATIENT_CLINIC_OR_DEPARTMENT_OTHER): Payer: Medicare Other

## 2015-01-20 DIAGNOSIS — C9000 Multiple myeloma not having achieved remission: Secondary | ICD-10-CM | POA: Diagnosis not present

## 2015-01-20 LAB — CBC WITH DIFFERENTIAL/PLATELET
BASO%: 7.1 % — ABNORMAL HIGH (ref 0.0–2.0)
Basophils Absolute: 0.2 10*3/uL — ABNORMAL HIGH (ref 0.0–0.1)
EOS%: 11.2 % — ABNORMAL HIGH (ref 0.0–7.0)
Eosinophils Absolute: 0.3 10*3/uL (ref 0.0–0.5)
HEMATOCRIT: 39.4 % (ref 38.4–49.9)
HGB: 13.5 g/dL (ref 13.0–17.1)
LYMPH#: 0.7 10*3/uL — AB (ref 0.9–3.3)
LYMPH%: 26.2 % (ref 14.0–49.0)
MCH: 32.3 pg (ref 27.2–33.4)
MCHC: 34.3 g/dL (ref 32.0–36.0)
MCV: 94.3 fL (ref 79.3–98.0)
MONO#: 0.3 10*3/uL (ref 0.1–0.9)
MONO%: 10.9 % (ref 0.0–14.0)
NEUT%: 44.6 % (ref 39.0–75.0)
NEUTROS ABS: 1.2 10*3/uL — AB (ref 1.5–6.5)
Platelets: 124 10*3/uL — ABNORMAL LOW (ref 140–400)
RBC: 4.18 10*6/uL — ABNORMAL LOW (ref 4.20–5.82)
RDW: 14.6 % (ref 11.0–14.6)
WBC: 2.7 10*3/uL — ABNORMAL LOW (ref 4.0–10.3)

## 2015-01-20 LAB — COMPREHENSIVE METABOLIC PANEL (CC13)
ALBUMIN: 3.5 g/dL (ref 3.5–5.0)
ALK PHOS: 47 U/L (ref 40–150)
ALT: 23 U/L (ref 0–55)
ANION GAP: 7 meq/L (ref 3–11)
AST: 22 U/L (ref 5–34)
BILIRUBIN TOTAL: 1.04 mg/dL (ref 0.20–1.20)
BUN: 13 mg/dL (ref 7.0–26.0)
CO2: 25 meq/L (ref 22–29)
CREATININE: 1 mg/dL (ref 0.7–1.3)
Calcium: 9.2 mg/dL (ref 8.4–10.4)
Chloride: 108 mEq/L (ref 98–109)
EGFR: 71 mL/min/{1.73_m2} — ABNORMAL LOW (ref >90)
GLUCOSE: 94 mg/dL (ref 70–140)
POTASSIUM: 3.7 meq/L (ref 3.5–5.1)
SODIUM: 141 meq/L (ref 136–145)
TOTAL PROTEIN: 6.3 g/dL — AB (ref 6.4–8.3)

## 2015-01-20 LAB — LACTATE DEHYDROGENASE (CC13): LDH: 141 U/L (ref 125–245)

## 2015-01-22 LAB — SPEP & IFE WITH QIG
ABNORMAL PROTEIN BAND1: 0.3 g/dL
ALBUMIN ELP: 3.6 g/dL — AB (ref 3.8–4.8)
ALPHA-2-GLOBULIN: 0.6 g/dL (ref 0.5–0.9)
Alpha-1-Globulin: 0.3 g/dL (ref 0.2–0.3)
BETA 2: 0.3 g/dL (ref 0.2–0.5)
Beta Globulin: 0.4 g/dL (ref 0.4–0.6)
Gamma Globulin: 1.1 g/dL (ref 0.8–1.7)
IGA: 180 mg/dL (ref 68–379)
IgG (Immunoglobin G), Serum: 1040 mg/dL (ref 650–1600)
IgM, Serum: 36 mg/dL — ABNORMAL LOW (ref 41–251)
Total Protein, Serum Electrophoresis: 6.2 g/dL (ref 6.1–8.1)

## 2015-01-22 LAB — KAPPA/LAMBDA LIGHT CHAINS
KAPPA LAMBDA RATIO: 1.57 (ref 0.26–1.65)
Kappa free light chain: 4.95 mg/dL — ABNORMAL HIGH (ref 0.33–1.94)
Lambda Free Lght Chn: 3.15 mg/dL — ABNORMAL HIGH (ref 0.57–2.63)

## 2015-01-22 LAB — BETA 2 MICROGLOBULIN, SERUM: BETA 2 MICROGLOBULIN: 2.21 mg/L (ref <=2.51)

## 2015-01-26 ENCOUNTER — Telehealth: Payer: Self-pay | Admitting: Internal Medicine

## 2015-01-26 NOTE — Telephone Encounter (Signed)
returned call adn s.w pt and confirm appts....pt okand aware

## 2015-01-27 ENCOUNTER — Telehealth: Payer: Self-pay | Admitting: Internal Medicine

## 2015-01-27 ENCOUNTER — Ambulatory Visit (HOSPITAL_BASED_OUTPATIENT_CLINIC_OR_DEPARTMENT_OTHER): Payer: Medicare Other | Admitting: Internal Medicine

## 2015-01-27 ENCOUNTER — Ambulatory Visit (HOSPITAL_BASED_OUTPATIENT_CLINIC_OR_DEPARTMENT_OTHER): Payer: Medicare Other

## 2015-01-27 ENCOUNTER — Encounter: Payer: Self-pay | Admitting: Internal Medicine

## 2015-01-27 ENCOUNTER — Other Ambulatory Visit: Payer: Self-pay | Admitting: Medical Oncology

## 2015-01-27 DIAGNOSIS — Z86711 Personal history of pulmonary embolism: Secondary | ICD-10-CM | POA: Diagnosis not present

## 2015-01-27 DIAGNOSIS — C9000 Multiple myeloma not having achieved remission: Secondary | ICD-10-CM | POA: Diagnosis not present

## 2015-01-27 DIAGNOSIS — G4700 Insomnia, unspecified: Secondary | ICD-10-CM | POA: Diagnosis not present

## 2015-01-27 MED ORDER — ZOLEDRONIC ACID 4 MG/100ML IV SOLN
4.0000 mg | Freq: Once | INTRAVENOUS | Status: AC
  Administered 2015-01-27: 4 mg via INTRAVENOUS
  Filled 2015-01-27: qty 100

## 2015-01-27 MED ORDER — RIVAROXABAN 20 MG PO TABS: 20.0000 mg | ORAL_TABLET | Freq: Every day | ORAL | Status: DC

## 2015-01-27 MED ORDER — TEMAZEPAM 15 MG PO CAPS: 15.0000 mg | ORAL_CAPSULE | Freq: Every evening | ORAL | Status: DC | PRN

## 2015-01-27 NOTE — Telephone Encounter (Signed)
Gave and printed appt sched for Aug and OCT.Marland KitchenMarland Kitchen

## 2015-01-27 NOTE — Progress Notes (Signed)
[place] Telephone:([phone]) [phone]   Fax:([phone]) [place], MD [place] [address]  DIAGNOSIS: Stage IIA multiple myeloma diagnosed in July of 2013.   PRIOR THERAPY:  1) Systemic chemotherapy with subcutaneous Velcade 1.3 mg/M2 on days 1, 4, 8 and 11 every 3 weeks in addition to Revlimid 25 mg by mouth daily for 14 days every 3 weeks and dexamethasone 40 mg on a weekly basis. Status post 4 planned cycles.  2) Systemic chemotherapy with subcutaneous Velcade 1.3 mg/M2 on days 1, 4, 8 and 11 every 3 weeks in addition to Revlimid 25 mg by mouth daily for 14 days every 3 weeks and dexamethasone 40 mg on a weekly basis. 4 more cycles are planned and he is status post 4 cycles of the second set of 4 cycles, now status post a total of 10 cycles. 3) Systemic chemotherapy with Carfilzomib 36 mg/M2 on days 1, 2, 8, 9, 15 and 16 in addition to cyclophosphamide 300 mg/M2 IV on days 1, 8 and 15 as well as dexamethasone on days 1, 8, 15 and 22 every 4 weeks. Status post 4 cycles. 4) Zometa 4 mg IV every month. 5) autologous peripheral blood stem cell transplant on 03/20/2013 at [facility]. 6) Consolidation chemotherapy with Carfilzomib 36 mg/M2 on days 1, 2, 8, 9, 15 and 16 every 4 weeks in addition to cyclophosphamide 300 mg/M2 on days 1, 8 and 15 as well as dexamethasone 40 mg on a weekly basis every 4 weeks. Status post 2 cycles. First cycle 06/11/2013. 7) Maintenance therapy with Revlimid 10 mg by mouth daily status post 3 months of treatment.   CURRENT THERAPY:  1) Maintenance therapy with Revlimid 15 mg by mouth daily started 11/09/2013. 2) Zometa 4 mg IV every 2 months. 3) Xarelto 20 mg by mouth daily for pulmonary embolism. 4) palliative radiotherapy to the lytic lesion in the skull under the care of Dr. Sondra Come   INTERVAL HISTORY: [name] 74 y.o. male returns to the clinic today for routine monthly  followup visit. The patient is feeling fine today with no specific complaints. He denied having any significant fatigue or weakness. He denied having any fever or chills. He has no nausea or vomiting.  He denied having any significant chest pain, shortness of breath, cough or hemoptysis. He is tolerating his treatment with Revlimid fairly well except for the leukocytopenia and mild neutropenia. He had repeat  myeloma panel and he is here for evaluation and discussion of his lab results.   MEDICAL HISTORY: Past Medical History  Diagnosis Date  . GERD (gastroesophageal reflux disease)   . Adenomatous polyp of colon 1996  . Arthritis     Osteoarthritis right knee, spine, wrist  . Benign prostatic hypertrophy     (Dr. Karsten Ro)  . History of chronic prostatitis   . History of melanoma     Back (Dr. Wilhemina Bonito); early melanoma R arm 03/2011  . Diverticulosis   . Arthritis of hand, right     Right thumb (Dr. Daylene Katayama)  . Degenerative disc disease   . Hemorrhoids   . Erosive esophagitis   . Back ache     r/o Multiple Myeloma  . Multiple myeloma 2013    skull lytic lesions--treated with radiation 07/2014  . DVT (deep venous thrombosis) 01/2014    R calf  . Pulmonary embolism 01/2014  . Radiation 07/21/14-08/05/14    left occipital  parietal skull 25 gray    ALLERGIES:  is allergic to penicillins.  MEDICATIONS:  Current Outpatient Prescriptions  Medication Sig Dispense Refill  . zolendronic acid (ZOMETA) 4 MG/5ML injection Inject 4 mg into the vein every 28 (twenty-eight) days.    Marland Kitchen acetaminophen (TYLENOL) 500 MG tablet Take 1,000 mg by mouth every 8 (eight) hours as needed.    Marland Kitchen ascorbic acid (VITAMIN C) 500 MG tablet Take 500 mg by mouth daily.    . B Complex-C (B-COMPLEX WITH VITAMIN C) tablet Take 1 tablet by mouth daily.    . cholecalciferol (VITAMIN D) 1000 UNITS tablet Take 1,000 Units by mouth daily.    . fish oil-omega-3 fatty acids 1000 MG capsule Take 1 g by mouth daily.     .  furosemide (LASIX) 20 MG tablet Take 1 tablet (20 mg total) by mouth daily. 14 tablet 1  . HYDROcodone-acetaminophen (NORCO) 7.5-325 MG per tablet Take 1 tablet by mouth every 6 (six) hours as needed for moderate pain. 30 tablet 0  . lenalidomide (REVLIMID) 15 MG capsule Take 1 capsule (15 mg total) by mouth daily. 28 capsule 0  . LORazepam (ATIVAN) 1 MG tablet Take 1 tablet (1 mg total) by mouth every 8 (eight) hours as needed for anxiety. 30 tablet 0  . methocarbamol (ROBAXIN) 500 MG tablet Take 1-2 tablets (500-1,000 mg total) by mouth every 6 (six) hours as needed for muscle spasms. 60 tablet 0  . Multiple Vitamin (MULTIVITAMIN WITH MINERALS) TABS Take 1 tablet by mouth daily.    . ondansetron (ZOFRAN) 4 MG tablet Take 1 tab every 6 hours as needed for nausea 40 tablet 1  . oxyCODONE-acetaminophen (PERCOCET) 10-325 MG per tablet Take 1 tablet by mouth every 6 (six) hours as needed for pain. 60 tablet 0  . pantoprazole (PROTONIX) 40 MG tablet TAKE 1 TABLET BY MOUTH TWICE DAILY 60 tablet 0  . PEDVAX HIB 7.5 MCG/0.5ML SUSP injection     . Probiotic Product (ALIGN) 4 MG CAPS Take 1 capsule by mouth daily.    . prochlorperazine (COMPAZINE) 10 MG tablet Take 1 tablet (10 mg total) by mouth every 6 (six) hours as needed. 30 tablet 1  . REVLIMID 10 MG capsule Take 10 mg by mouth daily.    . rivaroxaban (XARELTO) 20 MG TABS tablet Take 1 tablet (20 mg total) by mouth daily with supper. 30 tablet 1  . tadalafil (CIALIS) 20 MG tablet Take 1 tablet (20 mg total) by mouth daily as needed for erectile dysfunction. 6 tablet 11  . temazepam (RESTORIL) 15 MG capsule Take 1 capsule (15 mg total) by mouth at bedtime as needed. 30 capsule 0  . traMADol (ULTRAM) 50 MG tablet Take 1-2 tablets (50-100 mg total) by mouth every 6 (six) hours as needed for severe pain. 30 tablet 1   No current facility-administered medications for this visit.   Facility-Administered Medications Ordered in Other Visits  Medication  Dose Route Frequency Provider Last Rate Last Dose  . ondansetron (ZOFRAN) tablet 8 mg  8 mg Oral Once Curt Bears, MD        SURGICAL HISTORY:  Past Surgical History  Procedure Laterality Date  . Total knee replaced  2000    Left knee  . Appendectomy    . Colonoscopy  2009    adenomatous polyp  . Esophagogastroduodenoscopy  2009    mild inflammation at esophagogastric junction  . Excision of melanoma      back ( x3)  .  Great toe surgery      bilateral  . Inguinal hernia repair      left, x 2  . Total knee arthroplasty  06/08/2011    Procedure: TOTAL KNEE ARTHROPLASTY;  Surgeon: Ninetta Lights, MD;  Location: [facility];  Service: Orthopedics;  Laterality: Right;  osteonics  . [place]  10/2012  . Bone marrow transplant  03/2013    Stem Cell    REVIEW OF SYSTEMS:  Constitutional: negative Eyes: negative Ears, nose, mouth, throat, and face: negative Respiratory: negative Cardiovascular: negative Gastrointestinal: negative Genitourinary:negative Integument/breast: negative Hematologic/lymphatic: negative Musculoskeletal:negative Neurological: negative Behavioral/Psych: negative Endocrine: negative Allergic/Immunologic: negative   PHYSICAL EXAMINATION: General appearance: alert, cooperative and no distress Head: Normocephalic, without obvious abnormality, atraumatic Neck: no adenopathy, no JVD, supple, symmetrical, trachea midline and thyroid not enlarged, symmetric, no tenderness/mass/nodules Lymph nodes: Cervical, supraclavicular, and axillary nodes normal. Resp: clear to auscultation bilaterally Back: symmetric, no curvature. ROM normal. No CVA tenderness. Cardio: regular rate and rhythm, S1, S2 normal, no murmur, click, rub or gallop GI: soft, non-tender; bowel sounds normal; no masses,  no organomegaly Extremities: extremities normal, atraumatic, no cyanosis or edema Neurologic: Alert and oriented X 3, normal strength and tone. Normal symmetric reflexes. Normal  coordination and gait  ECOG PERFORMANCE STATUS: 1 - Symptomatic but completely ambulatory  There were no vitals taken for this visit.  LABORATORY DATA: Lab Results  Component Value Date   WBC 2.7* 01/20/2015   HGB 13.5 01/20/2015   HCT 39.4 01/20/2015   MCV 94.3 01/20/2015   PLT 124* 01/20/2015      Chemistry      Component Value Date/Time   NA 141 01/20/2015 0834   NA 139 04/22/2014 1417   K 3.7 01/20/2015 0834   K 3.9 04/22/2014 1417   CL 107 04/22/2014 1417   CL 109* 12/31/2012 0759   CO2 25 01/20/2015 0834   CO2 22 04/22/2014 1417   BUN 13.0 01/20/2015 0834   BUN 10 04/22/2014 1417   CREATININE 1.0 01/20/2015 0834   CREATININE 1.1 04/22/2014 1417   CREATININE 0.97 11/18/2013 1045      Component Value Date/Time   CALCIUM 9.2 01/20/2015 0834   CALCIUM 8.8 04/22/2014 1417   ALKPHOS 47 01/20/2015 0834   ALKPHOS 47 04/22/2014 1417   AST 22 01/20/2015 0834   AST 18 04/22/2014 1417   ALT 23 01/20/2015 0834   ALT 22 04/22/2014 1417   BILITOT 1.04 01/20/2015 0834   BILITOT 0.8 04/22/2014 1417     Myeloma panel: Beta-2 microglobulin 2.21, free kappa light chain 4.95, free lambda light chain 3.15 and kappa/lambda ratio 1.57. IgG 1040, IgA 180 and IgM 36.  RADIOGRAPHIC STUDIES: No results found.  ASSESSMENT AND PLAN: This is a very pleasant 74 years old white male with history of multiple myeloma status post induction chemotherapy with Velcade, Revlimid and dexamethasone followed by treatment with Carfilzomib, cyclophosphamide and dexamethasone with significant improvement in his disease followed by autologous peripheral blood stem cell transplant at [facility] on 03/20/2013. This was followed by 2 cycles of consolidation chemotherapy with Carfilzomib, Cytoxan and dexamethasone, and he tolerated it fairly well. He completed maintenance Revlimid 10 mg by mouth daily for 3 months and tolerating it fairly well. He started maintenance Revlimid with 15 mg by mouth daily  in May 2015 and tolerating it well. He has 1 more month of the 15 mg supply and after that he was switched to a 10 mg by mouth daily because  of the neutropenia and leukocytopenia. The recent myeloma panel showed no evidence for disease progression. I discussed the lab result with the patient today. He will continue treatment with Zometa every 2 months.  For the pulmonary embolism, he will continue on Xarelto 20 mg by mouth daily as scheduled.  For insomnia was giving a refill of Restoril. He would come back for follow-up visit in one month for reevaluation with repeat blood work. He was advised to call immediately if he has any concerning symptoms in the interval. The patient voices understanding of current disease status and treatment options and is in agreement with the current care plan.  All questions were answered. The patient knows to call the clinic with any problems, questions or concerns. We can certainly see the patient much sooner if necessary.  Disclaimer: This note was dictated with voice recognition software. Similar sounding words can inadvertently be transcribed and may not be corrected upon review.

## 2015-01-27 NOTE — Patient Instructions (Signed)

## 2015-01-27 NOTE — Progress Notes (Signed)
Called in refills for xarelto and restoril

## 2015-01-28 ENCOUNTER — Encounter: Payer: Self-pay | Admitting: Family Medicine

## 2015-01-28 ENCOUNTER — Other Ambulatory Visit: Payer: Self-pay | Admitting: Medical Oncology

## 2015-01-28 ENCOUNTER — Telehealth: Payer: Self-pay | Admitting: Medical Oncology

## 2015-01-28 ENCOUNTER — Ambulatory Visit (INDEPENDENT_AMBULATORY_CARE_PROVIDER_SITE_OTHER): Payer: Medicare Other | Admitting: Family Medicine

## 2015-01-28 VITALS — BP 110/70 | HR 60 | Temp 97.1°F | Ht 69.0 in | Wt 215.8 lb

## 2015-01-28 DIAGNOSIS — R300 Dysuria: Secondary | ICD-10-CM

## 2015-01-28 DIAGNOSIS — R1032 Left lower quadrant pain: Secondary | ICD-10-CM | POA: Diagnosis not present

## 2015-01-28 DIAGNOSIS — K5732 Diverticulitis of large intestine without perforation or abscess without bleeding: Secondary | ICD-10-CM | POA: Diagnosis not present

## 2015-01-28 DIAGNOSIS — D692 Other nonthrombocytopenic purpura: Secondary | ICD-10-CM | POA: Diagnosis not present

## 2015-01-28 DIAGNOSIS — H61001 Unspecified perichondritis of right external ear: Secondary | ICD-10-CM | POA: Diagnosis not present

## 2015-01-28 LAB — POCT URINALYSIS DIPSTICK
Bilirubin, UA: NEGATIVE
Blood, UA: NEGATIVE
Glucose, UA: NEGATIVE
Ketones, UA: NEGATIVE
Leukocytes, UA: NEGATIVE
NITRITE UA: NEGATIVE
Protein, UA: NEGATIVE
SPEC GRAV UA: 1.01
UROBILINOGEN UA: NEGATIVE
pH, UA: 6

## 2015-01-28 MED ORDER — METRONIDAZOLE 500 MG PO TABS: 500.0000 mg | ORAL_TABLET | Freq: Three times a day (TID) | ORAL | Status: DC

## 2015-01-28 MED ORDER — CIPROFLOXACIN HCL 500 MG PO TABS: 500.0000 mg | ORAL_TABLET | Freq: Two times a day (BID) | ORAL | Status: DC

## 2015-01-28 NOTE — Progress Notes (Signed)
Chief Complaint  Patient presents with  . Groin Pain    yesterday while walking felt an intense right sided groin pain. Also has diarrhea-was constipated took stool softener-took one Monday night. Also has some urinary burning and frequency.    Last weekend he walked 5 miles in the [place], then 4 miles the next day. He did not have any pain or problems. Yesterday, while walking at the park he had an acute onset of left groin pain.  He also had the urgency to void, and while voiding, he had the urgency to also have a bowel movement.  Stool was loose. He had slight decrease in pain after having bowel movement.  He has continued to have loose stools, last night and this morning. He did notice some blood when wiping.  He has h/o hemorrhoids.  He is feeling bloated.  Mild nausea, chronic, unchanged Left sided pain persists, along with frequent urination. Denies dysuria. He saw Dr. Earlie Server yesterday and everything was fine.  He does admit that he has had some mild discomfort over the last week.  Stamina is back, hair is back, and has been feeling good overall, until the onset of pain yesterday.  He is going to the lake at [place] today.  He has history of adenomatous and hyperplastic polyps as well as diverticulosis. Last colonoscopy was done in 2009 and he is due for followup (wasn't done related to immunosuppression).   He has h/o bilateral hernia repairs, believes the left was reparied twice.  Feels somewhat similar, but he really thinks this is a "bug", thought it was a UTI, not related to hernia or any kind of muscle strain.   PMH, PSH, SH reviewed.   Current Outpatient Prescriptions on File Prior to Visit  Medication Sig Dispense Refill  . acetaminophen (TYLENOL) 500 MG tablet Take 1,000 mg by mouth every 8 (eight) hours as needed.    Marland Kitchen ascorbic acid (VITAMIN C) 500 MG tablet Take 500 mg by mouth daily.    . B Complex-C (B-COMPLEX WITH VITAMIN C) tablet Take 1 tablet by mouth daily.    .  cholecalciferol (VITAMIN D) 1000 UNITS tablet Take 1,000 Units by mouth daily.    . fish oil-omega-3 fatty acids 1000 MG capsule Take 1 g by mouth daily.     Marland Kitchen lenalidomide (REVLIMID) 15 MG capsule Take 1 capsule (15 mg total) by mouth daily. 28 capsule 0  . Multiple Vitamin (MULTIVITAMIN WITH MINERALS) TABS Take 1 tablet by mouth daily.    . pantoprazole (PROTONIX) 40 MG tablet TAKE 1 TABLET BY MOUTH TWICE DAILY 60 tablet 0  . Probiotic Product (ALIGN) 4 MG CAPS Take 1 capsule by mouth daily.    . rivaroxaban (XARELTO) 20 MG TABS tablet Take 1 tablet (20 mg total) by mouth daily with supper. 30 tablet 1  . tadalafil (CIALIS) 20 MG tablet Take 1 tablet (20 mg total) by mouth daily as needed for erectile dysfunction. 6 tablet 11  . temazepam (RESTORIL) 15 MG capsule Take 1 capsule (15 mg total) by mouth at bedtime as needed. 30 capsule 0  . zolendronic acid (ZOMETA) 4 MG/5ML injection Inject 4 mg into the vein every 28 (twenty-eight) days.    . furosemide (LASIX) 20 MG tablet Take 1 tablet (20 mg total) by mouth daily. (Patient not taking: Reported on 01/28/2015) 14 tablet 1  . HYDROcodone-acetaminophen (NORCO) 7.5-325 MG per tablet Take 1 tablet by mouth every 6 (six) hours as needed for moderate pain. (Patient not  taking: Reported on 01/27/2015) 30 tablet 0  . LORazepam (ATIVAN) 1 MG tablet Take 1 tablet (1 mg total) by mouth every 8 (eight) hours as needed for anxiety. (Patient not taking: Reported on 01/27/2015) 30 tablet 0  . methocarbamol (ROBAXIN) 500 MG tablet Take 1-2 tablets (500-1,000 mg total) by mouth every 6 (six) hours as needed for muscle spasms. (Patient not taking: Reported on 01/27/2015) 60 tablet 0  . ondansetron (ZOFRAN) 4 MG tablet Take 1 tab every 6 hours as needed for nausea (Patient not taking: Reported on 01/27/2015) 40 tablet 1  . oxyCODONE-acetaminophen (PERCOCET) 10-325 MG per tablet Take 1 tablet by mouth every 6 (six) hours as needed for pain. (Patient not taking: Reported on  01/27/2015) 60 tablet 0  . PEDVAX HIB 7.5 MCG/0.5ML SUSP injection     . prochlorperazine (COMPAZINE) 10 MG tablet Take 1 tablet (10 mg total) by mouth every 6 (six) hours as needed. (Patient not taking: Reported on 01/27/2015) 30 tablet 1  . REVLIMID 10 MG capsule Take 10 mg by mouth daily.    . traMADol (ULTRAM) 50 MG tablet Take 1-2 tablets (50-100 mg total) by mouth every 6 (six) hours as needed for severe pain. (Patient not taking: Reported on 01/27/2015) 30 tablet 1   Current Facility-Administered Medications on File Prior to Visit  Medication Dose Route Frequency Provider Last Rate Last Dose  . ondansetron (ZOFRAN) tablet 8 mg  8 mg Oral Once Curt Bears, MD       Dose of revlimid being decreased to '10mg'$  once he finishes his current '15mg'$  supply Not needing any anti-emetics or pain medications. Last zometa infusion was yesterday   Allergies  Allergen Reactions  . Penicillins     REACTION: rash   ROS:  No fever, chills, headache, dizziness, chest pain, shortness of breath, URI symptoms, edema, bleeding, bruising.  +nausea, abdominal pain, urinary frequency, left sided groin pain and loose stools as per HPI. No other joint pains or other concerns.  PHYSICAL EXAM: BP 110/70 mmHg  Pulse 60  Temp(Src) 97.1 F (36.2 C) (Tympanic)  Ht '5\' 9"'$  (1.753 m)  Wt 215 lb 12.8 oz (97.886 kg)  BMI 31.85 kg/m2  Well appearing, pleasant male, in no distress, except during exam.  He appears comfortable at rest. HEENT: PERRL, EOMI, conjunctiva clear.  OP is clear, moist mucus membranes Neck: no lymphadenopathy or mass Heart: regular rate and rhythm Lungs: clear bilaterally Back: No CVA tenderness Abdomen: active bowel sounds.  Tender only slightly suprapubically. Very tender in LLQ, extending into the groin. He is tender in the left inguinal canal, but no bulging is present (no hernia palpable with cough). Extremities: no edema Skin: Senile purpura noted on forearms. No other rash or skin  lesions   ASSESSMENT/PLAN:  LLQ pain - suspect diverticulitis; can't r/o viral illness. possible MSK component  Burning with urination - Plan: POCT Urinalysis Dipstick  Diverticulitis of colon - Plan: metroNIDAZOLE (FLAGYL) 500 MG tablet, ciprofloxacin (CIPRO) 500 MG tablet   LLQ abdominal pain, and tenderness in the groin and inguinal canal, along with diarrhea. H/o diverticulosis.  Treat presumptively for diverticulosis, especially given mild immunosuppression. Has f/u next week for CPE  Bland diet (clear, advance to bland diet as tolerated). Cannot r/o musculoskeletal component--cannot take NSAIDs due to anticoagulation.  Rest and warm compresses to the left groin  Last CBC Lab Results  Component Value Date   WBC 2.7* 01/20/2015   HGB 13.5 01/20/2015   HCT 39.4 01/20/2015  MCV 94.3 01/20/2015   PLT 124* 01/20/2015   Flagyl '500mg'$  TID x 10 days cipro 500 BID x 10 days  Risks/side effects reviewed. Sx of worsening illness reviewed, for which he needs to seek medical care.  F/u next week at [facility] as scheduled.

## 2015-01-28 NOTE — Telephone Encounter (Signed)
done

## 2015-01-28 NOTE — Patient Instructions (Addendum)
We are treating you presumptively for diverticulitis. Take the antibiotics as directed, and NO ALCOHOL. I can't rule out a musculoskeletal component to the groin pain, so go ahead and rest (no extensive walking), and try warm compresses/heating pad. Return as scheduled next week for recheck.  Seek help sooner if worsening pain, vomiting, fever, blood in the stool, or other concerns develop.  Diverticulitis Diverticulitis is inflammation or infection of small pouches in your colon that form when you have a condition called diverticulosis. The pouches in your colon are called diverticula. Your colon, or large intestine, is where water is absorbed and stool is formed. Complications of diverticulitis can include:  Bleeding.  Severe infection.  Severe pain.  Perforation of your colon.  Obstruction of your colon. CAUSES  Diverticulitis is caused by bacteria. Diverticulitis happens when stool becomes trapped in diverticula. This allows bacteria to grow in the diverticula, which can lead to inflammation and infection. RISK FACTORS People with diverticulosis are at risk for diverticulitis. Eating a diet that does not include enough fiber from fruits and vegetables may make diverticulitis more likely to develop. SYMPTOMS  Symptoms of diverticulitis may include:  Abdominal pain and tenderness. The pain is normally located on the left side of the abdomen, but may occur in other areas.  Fever and chills.  Bloating.  Cramping.  Nausea.  Vomiting.  Constipation.  Diarrhea.  Blood in your stool. DIAGNOSIS  Your health care provider will ask you about your medical history and do a physical exam. You may need to have tests done because many medical conditions can cause the same symptoms as diverticulitis. Tests may include:  Blood tests.  Urine tests.  Imaging tests of the abdomen, including X-rays and CT scans. When your condition is under control, your health care provider may  recommend that you have a colonoscopy. A colonoscopy can show how severe your diverticula are and whether something else is causing your symptoms. TREATMENT  Most cases of diverticulitis are mild and can be treated at home. Treatment may include:  Taking over-the-counter pain medicines.  Following a clear liquid diet.  Taking antibiotic medicines by mouth for 7-10 days. More severe cases may be treated at a hospital. Treatment may include:  Not eating or drinking.  Taking prescription pain medicine.  Receiving antibiotic medicines through an IV tube.  Receiving fluids and nutrition through an IV tube.  Surgery. HOME CARE INSTRUCTIONS   Follow your health care provider's instructions carefully.  Follow a full liquid diet or other diet as directed by your health care provider. After your symptoms improve, your health care provider may tell you to change your diet. He or she may recommend you eat a high-fiber diet. Fruits and vegetables are good sources of fiber. Fiber makes it easier to pass stool.  Take fiber supplements or probiotics as directed by your health care provider.  Only take medicines as directed by your health care provider.  Keep all your follow-up appointments. SEEK MEDICAL CARE IF:   Your pain does not improve.  You have a hard time eating food.  Your bowel movements do not return to normal. SEEK IMMEDIATE MEDICAL CARE IF:   Your pain becomes worse.  Your symptoms do not get better.  Your symptoms suddenly get worse.  You have a fever.  You have repeated vomiting.  You have bloody or black, tarry stools. MAKE SURE YOU:   Understand these instructions.  Will watch your condition.  Will get help right away if you are  not doing well or get worse. Document Released: 04/06/2005 Document Revised: 07/02/2013 Document Reviewed: 05/22/2013 [facility] Patient Information 2015 [place], [place]. This information is not intended to replace advice given to you  by your health care provider. Make sure you discuss any questions you have with your health care provider.

## 2015-02-02 ENCOUNTER — Encounter: Payer: Self-pay | Admitting: Family Medicine

## 2015-02-04 ENCOUNTER — Other Ambulatory Visit: Payer: Medicare Other

## 2015-02-04 DIAGNOSIS — Z125 Encounter for screening for malignant neoplasm of prostate: Secondary | ICD-10-CM

## 2015-02-04 DIAGNOSIS — Z1329 Encounter for screening for other suspected endocrine disorder: Secondary | ICD-10-CM

## 2015-02-04 DIAGNOSIS — Z131 Encounter for screening for diabetes mellitus: Secondary | ICD-10-CM

## 2015-02-04 DIAGNOSIS — R5383 Other fatigue: Secondary | ICD-10-CM | POA: Diagnosis not present

## 2015-02-04 DIAGNOSIS — Z13220 Encounter for screening for lipoid disorders: Secondary | ICD-10-CM

## 2015-02-05 ENCOUNTER — Encounter: Payer: Self-pay | Admitting: Medical Oncology

## 2015-02-05 ENCOUNTER — Ambulatory Visit (INDEPENDENT_AMBULATORY_CARE_PROVIDER_SITE_OTHER): Payer: Medicare Other | Admitting: Family Medicine

## 2015-02-05 ENCOUNTER — Encounter: Payer: Self-pay | Admitting: Family Medicine

## 2015-02-05 ENCOUNTER — Telehealth: Payer: Self-pay | Admitting: Internal Medicine

## 2015-02-05 ENCOUNTER — Encounter: Payer: Medicare Other | Admitting: Family Medicine

## 2015-02-05 ENCOUNTER — Other Ambulatory Visit: Payer: Self-pay | Admitting: *Deleted

## 2015-02-05 VITALS — BP 108/68 | HR 52 | Ht 69.0 in | Wt 213.8 lb

## 2015-02-05 DIAGNOSIS — R1032 Left lower quadrant pain: Secondary | ICD-10-CM | POA: Diagnosis not present

## 2015-02-05 DIAGNOSIS — Z125 Encounter for screening for malignant neoplasm of prostate: Secondary | ICD-10-CM

## 2015-02-05 DIAGNOSIS — C9000 Multiple myeloma not having achieved remission: Secondary | ICD-10-CM

## 2015-02-05 DIAGNOSIS — Z7901 Long term (current) use of anticoagulants: Secondary | ICD-10-CM | POA: Diagnosis not present

## 2015-02-05 DIAGNOSIS — Z0000 Encounter for general adult medical examination without abnormal findings: Secondary | ICD-10-CM | POA: Diagnosis not present

## 2015-02-05 DIAGNOSIS — Z86010 Personal history of colonic polyps: Secondary | ICD-10-CM

## 2015-02-05 LAB — TSH: TSH: 1.573 u[IU]/mL (ref 0.350–4.500)

## 2015-02-05 LAB — LIPID PANEL
Cholesterol: 91 mg/dL — ABNORMAL LOW (ref 125–200)
HDL: 60 mg/dL (ref >=40)
LDL CALC: 19 mg/dL (ref <130)
Total CHOL/HDL Ratio: 1.5 Ratio (ref <=5.0)
Triglycerides: 62 mg/dL (ref <150)
VLDL: 12 mg/dL (ref <30)

## 2015-02-05 LAB — POCT URINALYSIS DIPSTICK
Bilirubin, UA: NEGATIVE
Blood, UA: NEGATIVE
Glucose, UA: NEGATIVE
Ketones, UA: NEGATIVE
Leukocytes, UA: NEGATIVE
NITRITE UA: NEGATIVE
Protein, UA: NEGATIVE
SPEC GRAV UA: 1.01
Urobilinogen, UA: NEGATIVE
pH, UA: 6

## 2015-02-05 LAB — PSA: PSA: 3.09 ng/mL (ref <=4.00)

## 2015-02-05 LAB — GLUCOSE, RANDOM: Glucose, Bld: 93 mg/dL (ref 65–99)

## 2015-02-05 NOTE — Patient Instructions (Signed)
  HEALTH MAINTENANCE RECOMMENDATIONS:  It is recommended that you get at least 30 minutes of aerobic exercise at least 5 days/week (for weight loss, you may need as much as 60-90 minutes). This can be any activity that gets your heart rate up. This can be divided in 10-15 minute intervals if needed, but try and build up your endurance at least once a week.  Weight bearing exercise is also recommended twice weekly.  Eat a healthy diet with lots of vegetables, fruits and fiber.  "Colorful" foods have a lot of vitamins (ie green vegetables, tomatoes, red peppers, etc).  Limit sweet tea, regular sodas and alcoholic beverages, all of which has a lot of calories and sugar.  Up to 2 alcoholic drinks daily may be beneficial for men (unless trying to lose weight, watch sugars).  Drink a lot of water.  Sunscreen of at least SPF 30 should be used on all sun-exposed parts of the skin when outside between the hours of 10 am and 4 pm (not just when at beach or pool, but even with exercise, golf, tennis, and yard work!)  Use a sunscreen that says "broad spectrum" so it covers both UVA and UVB rays, and make sure to reapply every 1-2 hours.  Remember to change the batteries in your smoke detectors when changing your clock times in the spring and fall.  Use your seat belt every time you are in a car, and please drive safely and not be distracted with cell phones and texting while driving.   [name] , Thank you for taking time to come for your Medicare Wellness Visit. I appreciate your ongoing commitment to your health goals. Please review the following plan we discussed and let me know if I can assist you in the future.   These are the goals we discussed: Goals    None      This is a list of the screening recommended for you and due dates:  Health Maintenance  Topic Date Due  . Colon Cancer Screening  12/01/2012  . Flu Shot  02/09/2015  . Tetanus Vaccine  01/04/2022  . Shingles Vaccine  Completed  .  Pneumonia vaccines  Completed    We are referring you back to Dr. Fuller Plan for discussion of colon cancer screening.

## 2015-02-05 NOTE — Progress Notes (Signed)
Chief Complaint  Patient presents with  . Annual Exam    nonfasting annual exam, labs already done.    [name] is a 74 y.o. male who presents for a complete physical, as well as annual wellness visit and follow-up on chronic medical conditions.  He has the following concerns:  He was seen last week with LLQ pain.  He was treated presumptively for diverticulitis.  After not noticing much relief from the antibiotics, he started taking anti-spasmodics that he had, which helped with the pain.  He has been taking it at night, and maybe once during the day, and pain is "settling down".  Has bad taste in the mouth from the flagyl, but otherwise tolerating the antibiotics well.  He is stable in his multiple myeloma treatments.  He is s/p stem cell transplant, and has completed the follow-up immunization regimen. He plans to check with [facility] if/when Zostavax is again acceptable to get (when counts okay, not immunocompromised).  His hair has grown back, no longer having any headaches or bone pain (s/p radiation for skull lesions).  Immunization History  Administered Date(s) Administered  . DTaP 06/04/2014  . DTaP / Hep B / IPV 03/04/2014, 06/04/2014, 08/06/2014  . HiB (PRP-OMP) 03/04/2014, 06/04/2014, 08/06/2014  . Influenza Split 04/05/2011, 04/16/2012  . Influenza,inj,Quad PF,36+ Mos 04/16/2014  . Pneumococcal Conjugate-13 03/04/2014, 06/04/2014, 08/06/2014  . Pneumococcal Polysaccharide-23 12/09/2005, 09/02/2010  . Td 06/11/2003  . Tdap 01/05/2012  . Zoster 09/02/2010   Last colonoscopy:  Dr. Fuller Plan 2009 adenomatous and hyperplastic polyps as well as diverticulosis Last PSA: 3 years ago, and with recent labs Dentist: twice yearly Ophtho: yearly  Exercise:  Walks an hour every day, plus free weights in his garage about 4x/week  Other doctors caring for patient include: Oncologist: Dr. Earlie Server Oncologist at [facility] Dr. Ok Edwards (did stem cell transplant) GI: Dr. Fuller Plan Ophtho:  Dr. Sherlean Foot Dentist: Dr. Philipp Ovens Dermatologist: Dr. Wilhemina Bonito Podiatrist:  Dr. Paulla Dolly  Depression, functional screen and fall screen are all documented in epic and entirely negative  End of Life Discussion:  Patient has a living will and medical power of attorney  Past Medical History  Diagnosis Date  . GERD (gastroesophageal reflux disease)   . Adenomatous polyp of colon 1996  . Arthritis     Osteoarthritis right knee, spine, wrist  . Benign prostatic hypertrophy     (Dr. Karsten Ro in past)  . History of chronic prostatitis   . History of melanoma     Back (Dr. Wilhemina Bonito); early melanoma R arm 03/2011  . Diverticulosis   . Arthritis of hand, right     Right thumb (Dr. Daylene Katayama)  . Degenerative disc disease   . Hemorrhoids   . Erosive esophagitis   . Back ache     r/o Multiple Myeloma  . Multiple myeloma 2013    skull lytic lesions--treated with radiation 07/2014  . DVT (deep venous thrombosis) 01/2014    R calf  . Pulmonary embolism 01/2014  . Radiation 07/21/14-08/05/14    left occipital parietal skull 25 gray    Past Surgical History  Procedure Laterality Date  . Total knee replaced  2000    Left knee  . Appendectomy    . Colonoscopy  2009    adenomatous polyp  . Esophagogastroduodenoscopy  2009    mild inflammation at esophagogastric junction  . Excision of melanoma      back ( x3)  . Great toe surgery  bilateral  . Inguinal hernia repair      left, x 2  . Total knee arthroplasty  06/08/2011    Procedure: TOTAL KNEE ARTHROPLASTY;  Surgeon: Ninetta Lights, MD;  Location: [facility];  Service: Orthopedics;  Laterality: Right;  osteonics  . [place]  10/2012  . Bone marrow transplant  03/2013    Stem Cell    History   Social History  . Marital Status: Married    Spouse Name: N/A  . Number of Children: 2  . Years of Education: N/A   Occupational History  . 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 Smoking status: Former  Smoker -- 3.00 packs/day for 10 years    Types: Cigarettes    Quit date: 07/11/1976  . Smokeless tobacco: Never Used     Comment: 3 PPD x 6 years  . Alcohol Use: 1.5 oz/week    3 Standard drinks or equivalent per week     Comment: 2 glasses of wine daily  . Drug Use: No  . Sexual Activity:    Partners: Female   Other Topics Concern  . Not on file   Social History Narrative   Married.  2 daughters, both in [place], 4 granddaughters    Family History  Problem Relation Age of Onset  . Dementia Mother   . Stroke Father     brainstem stroke  . Hypertension Father   . Diabetes Father   . Prostate cancer Maternal Grandfather   . Anesthesia problems Neg Hx   . Depression Daughter     Outpatient Encounter Prescriptions as of 02/05/2015  Medication Sig Note  . ascorbic acid (VITAMIN C) 500 MG tablet Take 500 mg by mouth daily.   . B Complex-C (B-COMPLEX WITH VITAMIN C) tablet Take 1 tablet by mouth daily.   . cholecalciferol (VITAMIN D) 1000 UNITS tablet Take 1,000 Units by mouth daily.   . ciprofloxacin (CIPRO) 500 MG tablet Take 1 tablet (500 mg total) by mouth 2 (two) times daily.   Marland Kitchen dicyclomine (BENTYL) 10 MG capsule Take 10 mg by mouth every 1 hour x 6 doses.  02/05/2015: Using recently, twice daily for LLQ pain (rx'd by Dr. Fuller Plan for IBS)   . fish oil-omega-3 fatty acids 1000 MG capsule Take 1 g by mouth daily.    Marland Kitchen lenalidomide (REVLIMID) 15 MG capsule Take 1 capsule (15 mg total) by mouth daily. 01/27/2015: Has 15 left  . metroNIDAZOLE (FLAGYL) 500 MG tablet Take 1 tablet (500 mg total) by mouth 3 (three) times daily.   . Multiple Vitamin (MULTIVITAMIN WITH MINERALS) TABS Take 1 tablet by mouth daily.   . pantoprazole (PROTONIX) 40 MG tablet TAKE 1 TABLET BY MOUTH TWICE DAILY   . Probiotic Product (ALIGN) 4 MG CAPS Take 1 capsule by mouth daily.   . rivaroxaban (XARELTO) 20 MG TABS tablet Take 1 tablet (20 mg total) by mouth daily with supper.   . tadalafil (CIALIS) 20 MG tablet  Take 1 tablet (20 mg total) by mouth daily as needed for erectile dysfunction.   . temazepam (RESTORIL) 15 MG capsule Take 1 capsule (15 mg total) by mouth at bedtime as needed. 02/05/2015: 2-3 times/week, prn  . zolendronic acid (ZOMETA) 4 MG/5ML injection Inject 4 mg into the vein every 28 (twenty-eight) days. 01/28/2015: Got last infusion yesterday  . acetaminophen (TYLENOL) 500 MG tablet Take 1,000 mg by mouth every 8 (eight) hours as needed.   Marland Kitchen  furosemide (LASIX) 20 MG tablet Take 1 tablet (20 mg total) by mouth daily. (Patient not taking: Reported on 01/28/2015) 02/05/2015: Used prn in past for ankle swelling; NOT needing  . HYDROcodone-acetaminophen (NORCO) 7.5-325 MG per tablet Take 1 tablet by mouth every 6 (six) hours as needed for moderate pain. (Patient not taking: Reported on 01/27/2015)   . LORazepam (ATIVAN) 1 MG tablet Take 1 tablet (1 mg total) by mouth every 8 (eight) hours as needed for anxiety. (Patient not taking: Reported on 01/27/2015) 01/28/2015: Uses prn  . methocarbamol (ROBAXIN) 500 MG tablet Take 1-2 tablets (500-1,000 mg total) by mouth every 6 (six) hours as needed for muscle spasms. (Patient not taking: Reported on 01/27/2015) 02/05/2015: Uses prn muscle spasms (hands)  . ondansetron (ZOFRAN) 4 MG tablet Take 1 tab every 6 hours as needed for nausea (Patient not taking: Reported on 01/27/2015) 06/18/2014: No nausea  . oxyCODONE-acetaminophen (PERCOCET) 10-325 MG per tablet Take 1 tablet by mouth every 6 (six) hours as needed for pain. (Patient not taking: Reported on 01/27/2015)   . prochlorperazine (COMPAZINE) 10 MG tablet Take 1 tablet (10 mg total) by mouth every 6 (six) hours as needed. (Patient not taking: Reported on 01/27/2015)   . REVLIMID 10 MG capsule Take 10 mg by mouth daily. 01/27/2015: Has not started  . traMADol (ULTRAM) 50 MG tablet Take 1-2 tablets (50-100 mg total) by mouth every 6 (six) hours as needed for severe pain. (Patient not taking: Reported on 01/27/2015)   .  [DISCONTINUED] PEDVAX HIB 7.5 MCG/0.5ML SUSP injection  10/15/2014: Received from: External Pharmacy   Facility-Administered Encounter Medications as of 02/05/2015  Medication  . ondansetron (ZOFRAN) tablet 8 mg  NOT taking any pain medications, not needing.  Allergies  Allergen Reactions  . Penicillins     REACTION: rash   ROS:  The patient denies anorexia, fever, weight changes, headaches,  vision loss, decreased hearing, ear pain, hoarseness, chest pain, palpitations, dizziness, syncope, dyspnea on exertion, cough, swelling, nausea, vomiting, constipation,  melena, hematochezia, indigestion/heartburn, hematuria, incontinence, erectile dysfunction, nocturia, weakened urine stream, dysuria, genital lesions, joint pains, numbness, tingling, weakness, tremor, suspicious skin lesions, depression, anxiety, abnormal bleeding/bruising, or enlarged lymph nodes. His hair has grown back. +LLQ improving.  He continues to have diarrhea/loose stools, over the last week. Intermittent hand spasms. No joint pain. Some easy bruising (mainly on his hands) from blood thinners. Intermittent insomnia (uses restoril prn a couple of times/week).  PHYSICAL EXAM:  BP 108/68 mmHg  Pulse 52  Ht $R'5\' 9"'gO$  (1.753 m)  Wt 213 lb 12.8 oz (96.979 kg)  BMI 31.56 kg/m2  General Appearance:   Alert, cooperative, no distress, appears stated age  Head:   Normocephalic, without obvious abnormality, atraumatic  Eyes:   PERRL, conjunctiva/corneas clear, EOM's intact, fundi   benign  Ears:   Normal TM's and external ear canals  Nose:  Nares normal, mucosa normal, no drainage or sinus tenderness  Throat:  Lips, mucosa, and tongue normal; teeth and gums normal  Neck:  Supple, no lymphadenopathy; thyroid: no enlargement/tenderness/nodules; no carotid  bruit or JVD  Back:  Spine nontender, no curvature, ROM normal, no CVAtenderness.  Lungs:   Clear to auscultation bilaterally without wheezes,  rales or ronchi; respirations unlabored  Chest Wall:   No tenderness. Has deformity of inferior ribs on L (h/o traumatic injury)  Heart:   Regular rate and rhythm, S1 and S2 normal, no murmur, rub  or gallop  Breast Exam:   No chest wall  tenderness, masses or gynecomastia  Abdomen:   Soft, non-tender, nondistended, normoactive bowel sounds,   no masses, no hepatosplenomegaly.  Only minimal left sided tenderness today  Genitalia:   Normal male external genitalia without lesions. Testicles without masses, smaller on the left than the right. No inguinal hernias.  Rectal:   Normal sphincter tone, no masses or tenderness; guaiac negative stool. Prostate smooth, no nodules, mild-moderately enlarged.  Extremities:  No clubbing, cyanosis or edema  Pulses:  2+ and symmetric all extremities  Skin:  Skin color, texture, turgor normal, no rashes or lesions. Many angiomas and other benign lesions.  There is a rough patch over the right ear (c/w AK but pt states that his derm said it was just his cartilage, didn't need treatment)  Lymph nodes:  Cervical, supraclavicular, and axillary nodes normal  Neurologic:  CNII-XII intact, normal strength, sensation and gait; reflexes 2+ and symmetric throughout  Psych: Normal mood, affect, hygiene and grooming.        Urine dip: normal.  Lab Results  Component Value Date   TSH 1.573 02/04/2015   Fasting glucose 93  Lab Results  Component Value Date   CHOL 91* 02/04/2015   HDL 60 02/04/2015   LDLCALC 19 02/04/2015   TRIG 62 02/04/2015   CHOLHDL 1.5 02/04/2015   Lab Results  Component Value Date   PSA 3.09 02/04/2015   PSA 1.74 01/05/2012     Chemistry      Component Value Date/Time   NA 141 01/20/2015 0834   NA 139 04/22/2014 1417   K 3.7 01/20/2015 0834   K 3.9 04/22/2014 1417   CL 107 04/22/2014 1417   CL 109* 12/31/2012 0759   CO2 25 01/20/2015 0834    CO2 22 04/22/2014 1417   BUN 13.0 01/20/2015 0834   BUN 10 04/22/2014 1417   CREATININE 1.0 01/20/2015 0834   CREATININE 1.1 04/22/2014 1417   CREATININE 0.97 11/18/2013 1045      Component Value Date/Time   CALCIUM 9.2 01/20/2015 0834   CALCIUM 8.8 04/22/2014 1417   ALKPHOS 47 01/20/2015 0834   ALKPHOS 47 04/22/2014 1417   AST 22 01/20/2015 0834   AST 18 04/22/2014 1417   ALT 23 01/20/2015 0834   ALT 22 04/22/2014 1417   BILITOT 1.04 01/20/2015 0834   BILITOT 0.8 04/22/2014 1417     Lab Results  Component Value Date   WBC 2.7* 01/20/2015   HGB 13.5 01/20/2015   HCT 39.4 01/20/2015   MCV 94.3 01/20/2015   PLT 124* 01/20/2015     ASSESSMENT/PLAN:  Annual physical exam - Plan: POCT Urinalysis Dipstick  LLQ pain - improving. On ABX for presumptive diverticulitis, vs IBS. Continue anti-spasmodic prn and complete ABX  Multiple myeloma - stable  Current use of long term anticoagulation - On Xarelto for h/o calf DVT. no complications  Medicare annual wellness visit, initial  History of colon polyps - counseled re: colon cancer screening options (colonoscopy, cologard); f/u with Dr. Fuller Plan to further decide  Lab results reviewed in detail.  Discussed PSA screening (risks/benefits). He had a significant rise in PSA, but over a 3 year period, so velocity is actually WNL. He would like to continue with yearly screening.  Recommended at least 30 minutes of aerobic activity at least 5 days/week, weight-bearing exercising 2x/week; proper sunscreen use reviewed; healthy diet and alcohol recommendations (less than or equal to 2 drinks/day) reviewed; regular seatbelt use; changing batteries in smoke detectors. Self-testicular exams. Immunization recommendations discussed--yearly high  dose flu shots in the Fall. Colonoscopy recommendations reviewed--Referred back to Dr. Fuller Plan.to discuss colon cancer screening options (colonoscopy vs cologard in patient with h/o adenomatous polyps, on  Xarelto and Revlimid for his multiple myleoma) as well as f/u on IBS/LLQ pain if not resolved.   F/u 1 year, sooner prn. PSA, TSH, fasting glucose prior (lipids not needed again; remainder of labs monitored by oncologist)  Medicare Attestation I have personally reviewed: The patient's medical and social history Their use of alcohol, tobacco or illicit drugs Their current medications and supplements The patient's functional ability including ADLs,fall risks, home safety risks, cognitive, and hearing and visual impairment Diet and physical activities Evidence for depression or mood disorders  The patient's weight, height, BMI, and visual acuity have been recorded in the chart.  I have made referrals, counseling, and provided education to the patient based on review of the above and I have provided the patient with a written personalized care plan for preventive services.     Zainah Steven A, MD   02/05/2015

## 2015-02-05 NOTE — Telephone Encounter (Signed)
Orders entered

## 2015-02-05 NOTE — Telephone Encounter (Signed)
Pt has scheduled his appt for cpe July 31st 2017 but would like to come in for lab work Friday July 28th. Please put in future orders

## 2015-02-09 ENCOUNTER — Other Ambulatory Visit: Payer: Self-pay | Admitting: Internal Medicine

## 2015-02-17 DIAGNOSIS — Z7901 Long term (current) use of anticoagulants: Secondary | ICD-10-CM | POA: Diagnosis not present

## 2015-02-17 DIAGNOSIS — H269 Unspecified cataract: Secondary | ICD-10-CM | POA: Diagnosis not present

## 2015-02-17 DIAGNOSIS — Z86711 Personal history of pulmonary embolism: Secondary | ICD-10-CM | POA: Diagnosis not present

## 2015-02-17 DIAGNOSIS — Z23 Encounter for immunization: Secondary | ICD-10-CM | POA: Diagnosis not present

## 2015-02-17 DIAGNOSIS — F419 Anxiety disorder, unspecified: Secondary | ICD-10-CM | POA: Diagnosis not present

## 2015-02-17 DIAGNOSIS — Z86718 Personal history of other venous thrombosis and embolism: Secondary | ICD-10-CM | POA: Diagnosis not present

## 2015-02-17 DIAGNOSIS — M1990 Unspecified osteoarthritis, unspecified site: Secondary | ICD-10-CM | POA: Diagnosis not present

## 2015-02-17 DIAGNOSIS — Z48298 Encounter for aftercare following other organ transplant: Secondary | ICD-10-CM | POA: Diagnosis not present

## 2015-02-17 DIAGNOSIS — Z880 Allergy status to penicillin: Secondary | ICD-10-CM | POA: Diagnosis not present

## 2015-02-17 DIAGNOSIS — Z79899 Other long term (current) drug therapy: Secondary | ICD-10-CM | POA: Diagnosis not present

## 2015-02-17 DIAGNOSIS — Z9484 Stem cells transplant status: Secondary | ICD-10-CM | POA: Diagnosis not present

## 2015-02-17 DIAGNOSIS — C9000 Multiple myeloma not having achieved remission: Secondary | ICD-10-CM | POA: Diagnosis not present

## 2015-02-23 ENCOUNTER — Other Ambulatory Visit: Payer: Self-pay | Admitting: Medical Oncology

## 2015-02-23 DIAGNOSIS — C9000 Multiple myeloma not having achieved remission: Secondary | ICD-10-CM

## 2015-02-24 ENCOUNTER — Ambulatory Visit (HOSPITAL_BASED_OUTPATIENT_CLINIC_OR_DEPARTMENT_OTHER): Payer: Medicare Other | Admitting: Internal Medicine

## 2015-02-24 ENCOUNTER — Encounter: Payer: Self-pay | Admitting: Internal Medicine

## 2015-02-24 ENCOUNTER — Telehealth: Payer: Self-pay | Admitting: Internal Medicine

## 2015-02-24 ENCOUNTER — Other Ambulatory Visit (HOSPITAL_BASED_OUTPATIENT_CLINIC_OR_DEPARTMENT_OTHER): Payer: Medicare Other

## 2015-02-24 VITALS — BP 94/78 | HR 54 | Temp 97.9°F | Resp 18 | Ht 69.0 in | Wt 214.2 lb

## 2015-02-24 DIAGNOSIS — G4700 Insomnia, unspecified: Secondary | ICD-10-CM | POA: Diagnosis not present

## 2015-02-24 DIAGNOSIS — Z86711 Personal history of pulmonary embolism: Secondary | ICD-10-CM

## 2015-02-24 DIAGNOSIS — C9000 Multiple myeloma not having achieved remission: Secondary | ICD-10-CM | POA: Diagnosis not present

## 2015-02-24 LAB — COMPREHENSIVE METABOLIC PANEL (CC13)
ALT: 24 U/L (ref 0–55)
ANION GAP: 8 meq/L (ref 3–11)
AST: 20 U/L (ref 5–34)
Albumin: 3.5 g/dL (ref 3.5–5.0)
Alkaline Phosphatase: 45 U/L (ref 40–150)
BUN: 11.6 mg/dL (ref 7.0–26.0)
CALCIUM: 9.2 mg/dL (ref 8.4–10.4)
CHLORIDE: 108 meq/L (ref 98–109)
CO2: 24 mEq/L (ref 22–29)
CREATININE: 1 mg/dL (ref 0.7–1.3)
EGFR: 73 mL/min/{1.73_m2} — AB (ref >90)
Glucose: 100 mg/dl (ref 70–140)
POTASSIUM: 4 meq/L (ref 3.5–5.1)
Sodium: 140 mEq/L (ref 136–145)
Total Bilirubin: 0.68 mg/dL (ref 0.20–1.20)
Total Protein: 6.3 g/dL — ABNORMAL LOW (ref 6.4–8.3)

## 2015-02-24 LAB — CBC WITH DIFFERENTIAL/PLATELET
BASO%: 7.2 % — AB (ref 0.0–2.0)
Basophils Absolute: 0.2 10*3/uL — ABNORMAL HIGH (ref 0.0–0.1)
EOS ABS: 0.3 10*3/uL (ref 0.0–0.5)
EOS%: 12.4 % — AB (ref 0.0–7.0)
HCT: 40.4 % (ref 38.4–49.9)
HEMOGLOBIN: 13.3 g/dL (ref 13.0–17.1)
LYMPH%: 28.7 % (ref 14.0–49.0)
MCH: 31.4 pg (ref 27.2–33.4)
MCHC: 33 g/dL (ref 32.0–36.0)
MCV: 95.1 fL (ref 79.3–98.0)
MONO#: 0.3 10*3/uL (ref 0.1–0.9)
MONO%: 11.7 % (ref 0.0–14.0)
NEUT%: 40 % (ref 39.0–75.0)
NEUTROS ABS: 1 10*3/uL — AB (ref 1.5–6.5)
Platelets: 132 10*3/uL — ABNORMAL LOW (ref 140–400)
RBC: 4.24 10*6/uL (ref 4.20–5.82)
RDW: 15.1 % — AB (ref 11.0–14.6)
WBC: 2.5 10*3/uL — AB (ref 4.0–10.3)
lymph#: 0.7 10*3/uL — ABNORMAL LOW (ref 0.9–3.3)

## 2015-02-24 NOTE — Progress Notes (Signed)
[name] [phone]   Fax:([phone]) [place], MD [place] [address]  DIAGNOSIS: Stage IIA multiple myeloma diagnosed in July of 2013.   PRIOR THERAPY:  1) Systemic chemotherapy with subcutaneous Velcade 1.3 mg/M2 on days 1, 4, 8 and 11 every 3 weeks in addition to Revlimid 25 mg by mouth daily for 14 days every 3 weeks and dexamethasone 40 mg on a weekly basis. Status post 4 planned cycles.  2) Systemic chemotherapy with subcutaneous Velcade 1.3 mg/M2 on days 1, 4, 8 and 11 every 3 weeks in addition to Revlimid 25 mg by mouth daily for 14 days every 3 weeks and dexamethasone 40 mg on a weekly basis. 4 more cycles are planned and he is status post 4 cycles of the second set of 4 cycles, now status post a total of 10 cycles. 3) Systemic chemotherapy with Carfilzomib 36 mg/M2 on days 1, 2, 8, 9, 15 and 16 in addition to cyclophosphamide 300 mg/M2 IV on days 1, 8 and 15 as well as dexamethasone on days 1, 8, 15 and 22 every 4 weeks. Status post 4 cycles. 4) Zometa 4 mg IV every month. 5) autologous peripheral blood stem cell transplant on 03/20/2013 at [facility]. 6) Consolidation chemotherapy with Carfilzomib 36 mg/M2 on days 1, 2, 8, 9, 15 and 16 every 4 weeks in addition to cyclophosphamide 300 mg/M2 on days 1, 8 and 15 as well as dexamethasone 40 mg on a weekly basis every 4 weeks. Status post 2 cycles. First cycle 06/11/2013. 7) Maintenance therapy with Revlimid 10 mg by mouth daily status post 3 months of treatment. 8) palliative radiotherapy to the lytic lesion in the skull under the care of Dr. Sondra Come 9) Maintenance therapy with Revlimid 15 mg by mouth daily started 11/09/2013 and discontinued 02/18/2015 secondary to persistent neutropenia.   CURRENT THERAPY:  1) Maintenance therapy with Revlimid 10 mg by mouth daily started 02/19/2015. 2) Zometa 4 mg IV every 2 months. 3) Xarelto 20 mg by  mouth daily for pulmonary embolism.   INTERVAL HISTORY: [name] 74 y.o. male returns to the clinic today for routine monthly followup visit. The patient is feeling fine today with no specific complaints. He was seen recently by Dr. she at [facility] and repeat protein study showed no evidence for progression. He will see him back for follow-up visit in one year. He denied having any significant fatigue or weakness. He denied having any fever or chills. He has no nausea or vomiting.  He denied having any significant chest pain, shortness of breath, cough or hemoptysis. He is tolerating his treatment with Revlimid fairly well except for the leukocytopenia and mild neutropenia. He started the reduced dose of Revlimid 10 mg by mouth daily 3 days ago. He had repeat CBC, comprehensive metabolic panel and LDH performed earlier today and he is here for evaluation and discussion of his lab results.  MEDICAL HISTORY: Past Medical History  Diagnosis Date  . GERD (gastroesophageal reflux disease)   . Adenomatous polyp of colon 1996  . Arthritis     Osteoarthritis right knee, spine, wrist  . Benign prostatic hypertrophy     (Dr. Karsten Ro in past)  . History of chronic prostatitis   . History of melanoma     Back (Dr. Wilhemina Bonito); early melanoma R arm 03/2011  . Diverticulosis   . Arthritis of hand, right  Right thumb (Dr. Daylene Katayama)  . Degenerative disc disease   . Hemorrhoids   . Erosive esophagitis   . Back ache     r/o Multiple Myeloma  . Multiple myeloma 2013    skull lytic lesions--treated with radiation 07/2014  . DVT (deep venous thrombosis) 01/2014    R calf  . Pulmonary embolism 01/2014  . Radiation 07/21/14-08/05/14    left occipital parietal skull 25 gray    ALLERGIES:  is allergic to penicillins.  MEDICATIONS:  Current Outpatient Prescriptions  Medication Sig Dispense Refill  . acetaminophen (TYLENOL) 500 MG tablet Take 1,000 mg by mouth every 8 (eight) hours as needed.     Marland Kitchen ascorbic acid (VITAMIN C) 500 MG tablet Take 500 mg by mouth daily.    . B Complex-C (B-COMPLEX WITH VITAMIN C) tablet Take 1 tablet by mouth daily.    . cholecalciferol (VITAMIN D) 1000 UNITS tablet Take 1,000 Units by mouth daily.    . ciprofloxacin (CIPRO) 500 MG tablet Take 1 tablet (500 mg total) by mouth 2 (two) times daily. 20 tablet 0  . dicyclomine (BENTYL) 10 MG capsule Take 10 mg by mouth every 1 hour x 6 doses.   1  . fish oil-omega-3 fatty acids 1000 MG capsule Take 1 g by mouth daily.     . furosemide (LASIX) 20 MG tablet Take 1 tablet (20 mg total) by mouth daily. (Patient not taking: Reported on 01/28/2015) 14 tablet 1  . HYDROcodone-acetaminophen (NORCO) 7.5-325 MG per tablet Take 1 tablet by mouth every 6 (six) hours as needed for moderate pain. (Patient not taking: Reported on 01/27/2015) 30 tablet 0  . lenalidomide (REVLIMID) 15 MG capsule Take 1 capsule (15 mg total) by mouth daily. 28 capsule 0  . LORazepam (ATIVAN) 1 MG tablet Take 1 tablet (1 mg total) by mouth every 8 (eight) hours as needed for anxiety. (Patient not taking: Reported on 01/27/2015) 30 tablet 0  . methocarbamol (ROBAXIN) 500 MG tablet Take 1-2 tablets (500-1,000 mg total) by mouth every 6 (six) hours as needed for muscle spasms. (Patient not taking: Reported on 01/27/2015) 60 tablet 0  . metroNIDAZOLE (FLAGYL) 500 MG tablet Take 1 tablet (500 mg total) by mouth 3 (three) times daily. 30 tablet 0  . Multiple Vitamin (MULTIVITAMIN WITH MINERALS) TABS Take 1 tablet by mouth daily.    . ondansetron (ZOFRAN) 4 MG tablet Take 1 tab every 6 hours as needed for nausea (Patient not taking: Reported on 01/27/2015) 40 tablet 1  . oxyCODONE-acetaminophen (PERCOCET) 10-325 MG per tablet Take 1 tablet by mouth every 6 (six) hours as needed for pain. (Patient not taking: Reported on 01/27/2015) 60 tablet 0  . pantoprazole (PROTONIX) 40 MG tablet TAKE 1 TABLET BY MOUTH TWICE DAILY 60 tablet 0  . Probiotic Product (ALIGN) 4 MG  CAPS Take 1 capsule by mouth daily.    . prochlorperazine (COMPAZINE) 10 MG tablet Take 1 tablet (10 mg total) by mouth every 6 (six) hours as needed. (Patient not taking: Reported on 01/27/2015) 30 tablet 1  . REVLIMID 10 MG capsule Take 10 mg by mouth daily.    . rivaroxaban (XARELTO) 20 MG TABS tablet Take 1 tablet (20 mg total) by mouth daily with supper. 30 tablet 1  . tadalafil (CIALIS) 20 MG tablet Take 1 tablet (20 mg total) by mouth daily as needed for erectile dysfunction. 6 tablet 11  . temazepam (RESTORIL) 15 MG capsule Take 1 capsule (15 mg total) by mouth at  bedtime as needed. 30 capsule 0  . traMADol (ULTRAM) 50 MG tablet Take 1-2 tablets (50-100 mg total) by mouth every 6 (six) hours as needed for severe pain. (Patient not taking: Reported on 01/27/2015) 30 tablet 1  . zolendronic acid (ZOMETA) 4 MG/5ML injection Inject 4 mg into the vein every 28 (twenty-eight) days.     No current facility-administered medications for this visit.   Facility-Administered Medications Ordered in Other Visits  Medication Dose Route Frequency Provider Last Rate Last Dose  . ondansetron (ZOFRAN) tablet 8 mg  8 mg Oral Once Curt Bears, MD        SURGICAL HISTORY:  Past Surgical History  Procedure Laterality Date  . Total knee replaced  2000    Left knee  . Appendectomy    . Colonoscopy  2009    adenomatous polyp  . Esophagogastroduodenoscopy  2009    mild inflammation at esophagogastric junction  . Excision of melanoma      back ( x3)  . Great toe surgery      bilateral  . Inguinal hernia repair      left, x 2  . Total knee arthroplasty  06/08/2011    Procedure: TOTAL KNEE ARTHROPLASTY;  Surgeon: Ninetta Lights, MD;  Location: [facility];  Service: Orthopedics;  Laterality: Right;  osteonics  . [place]  10/2012  . Bone marrow transplant  03/2013    Stem Cell    REVIEW OF SYSTEMS:  Constitutional: negative Eyes: negative Ears, nose, mouth, throat, and face: negative Respiratory:  negative Cardiovascular: negative Gastrointestinal: negative Genitourinary:negative Integument/breast: negative Hematologic/lymphatic: negative Musculoskeletal:negative Neurological: negative Behavioral/Psych: negative Endocrine: negative Allergic/Immunologic: negative   PHYSICAL EXAMINATION: General appearance: alert, cooperative and no distress Head: Normocephalic, without obvious abnormality, atraumatic Neck: no adenopathy, no JVD, supple, symmetrical, trachea midline and thyroid not enlarged, symmetric, no tenderness/mass/nodules Lymph nodes: Cervical, supraclavicular, and axillary nodes normal. Resp: clear to auscultation bilaterally Back: symmetric, no curvature. ROM normal. No CVA tenderness. Cardio: regular rate and rhythm, S1, S2 normal, no murmur, click, rub or gallop GI: soft, non-tender; bowel sounds normal; no masses,  no organomegaly Extremities: extremities normal, atraumatic, no cyanosis or edema Neurologic: Alert and oriented X 3, normal strength and tone. Normal symmetric reflexes. Normal coordination and gait  ECOG PERFORMANCE STATUS: 1 - Symptomatic but completely ambulatory  Blood pressure 94/78, pulse 54, temperature 97.9 F (36.6 C), temperature source Oral, resp. rate 18, height $RemoveBe'5\' 9"'[id number]$  (1.753 m), weight 214 lb 3.2 oz (97.16 kg), SpO2 99 %.  LABORATORY DATA: Lab Results  Component Value Date   WBC 2.5* 02/24/2015   HGB 13.3 02/24/2015   HCT 40.4 02/24/2015   MCV 95.1 02/24/2015   PLT 132* 02/24/2015      Chemistry      Component Value Date/Time   NA 141 01/20/2015 0834   NA 139 04/22/2014 1417   K 3.7 01/20/2015 0834   K 3.9 04/22/2014 1417   CL 107 04/22/2014 1417   CL 109* 12/31/2012 0759   CO2 25 01/20/2015 0834   CO2 22 04/22/2014 1417   BUN 13.0 01/20/2015 0834   BUN 10 04/22/2014 1417   CREATININE 1.0 01/20/2015 0834   CREATININE 1.1 04/22/2014 1417   CREATININE 0.97 11/18/2013 1045      Component Value Date/Time   CALCIUM 9.2  01/20/2015 0834   CALCIUM 8.8 04/22/2014 1417   ALKPHOS 47 01/20/2015 0834   ALKPHOS 47 04/22/2014 1417   AST 22 01/20/2015 0834   AST 18  04/22/2014 1417   ALT 23 01/20/2015 0834   ALT 22 04/22/2014 1417   BILITOT 1.04 01/20/2015 0834   BILITOT 0.8 04/22/2014 1417      RADIOGRAPHIC STUDIES: No results found.  ASSESSMENT AND PLAN: This is a very pleasant 74 years old white male with history of multiple myeloma status post induction chemotherapy with Velcade, Revlimid and dexamethasone followed by treatment with Carfilzomib, cyclophosphamide and dexamethasone with significant improvement in his disease followed by autologous peripheral blood stem cell transplant at [facility] on 03/20/2013. This was followed by 2 cycles of consolidation chemotherapy with Carfilzomib, Cytoxan and dexamethasone, and he tolerated it fairly well. He completed maintenance Revlimid 10 mg by mouth daily for 3 months and tolerating it fairly well. He started maintenance Revlimid with 15 mg by mouth daily in May 2015 and tolerating it well except for the persistent neutropenia.  He started the reduced dose of Revlimid 10 mg by mouth daily on 02/19/2015. I discussed the lab result with the patient today. He will continue treatment with Zometa every 2 months.  For the pulmonary embolism, he will continue on Xarelto 20 mg by mouth daily as scheduled.  For insomnia was giving a refill of Restoril. He would come back for follow-up visit in one month for reevaluation with repeat blood work. He was advised to call immediately if he has any concerning symptoms in the interval. The patient voices understanding of current disease status and treatment options and is in agreement with the current care plan.  All questions were answered. The patient knows to call the clinic with any problems, questions or concerns. We can certainly see the patient much sooner if necessary.  Disclaimer: This note was dictated with voice  recognition software. Similar sounding words can inadvertently be transcribed and may not be corrected upon review.

## 2015-02-24 NOTE — Telephone Encounter (Signed)
per pof to sch pt appt-gave pt copy of avs °

## 2015-02-26 ENCOUNTER — Other Ambulatory Visit: Payer: Self-pay | Admitting: Medical Oncology

## 2015-02-26 NOTE — Progress Notes (Signed)
erroneous

## 2015-02-27 ENCOUNTER — Encounter: Payer: Self-pay | Admitting: Internal Medicine

## 2015-02-27 NOTE — Progress Notes (Signed)
Faxed xarelto pa form to [facility] °

## 2015-02-28 ENCOUNTER — Encounter: Payer: Self-pay | Admitting: Family Medicine

## 2015-02-28 DIAGNOSIS — N529 Male erectile dysfunction, unspecified: Secondary | ICD-10-CM

## 2015-03-02 ENCOUNTER — Encounter: Payer: Self-pay | Admitting: Internal Medicine

## 2015-03-02 NOTE — Progress Notes (Signed)
[facility] approved xarelto '20mg'$  from 02/27/15-02/27/16

## 2015-03-02 NOTE — Progress Notes (Signed)
Faxed request for Xarelto authorization received from [facility] on [address] has already sent form to [facility].

## 2015-03-03 MED ORDER — TADALAFIL 20 MG PO TABS: 20.0000 mg | ORAL_TABLET | Freq: Every day | ORAL | Status: DC | PRN

## 2015-03-06 ENCOUNTER — Other Ambulatory Visit: Payer: Self-pay | Admitting: Internal Medicine

## 2015-03-10 ENCOUNTER — Other Ambulatory Visit: Payer: Self-pay | Admitting: Medical Oncology

## 2015-03-10 DIAGNOSIS — C9000 Multiple myeloma not having achieved remission: Secondary | ICD-10-CM

## 2015-03-10 MED ORDER — REVLIMID 10 MG PO CAPS: 10.0000 mg | ORAL_CAPSULE | Freq: Every day | ORAL | Status: DC

## 2015-03-10 NOTE — Progress Notes (Signed)
revlimid faxed to new pharmacy [facility]

## 2015-03-24 ENCOUNTER — Ambulatory Visit (INDEPENDENT_AMBULATORY_CARE_PROVIDER_SITE_OTHER): Payer: Medicare Other | Admitting: Gastroenterology

## 2015-03-24 ENCOUNTER — Encounter: Payer: Self-pay | Admitting: Gastroenterology

## 2015-03-24 VITALS — BP 106/72 | HR 60 | Ht 69.5 in | Wt 212.5 lb

## 2015-03-24 DIAGNOSIS — K2100 Gastro-esophageal reflux disease with esophagitis, without bleeding: Secondary | ICD-10-CM

## 2015-03-24 DIAGNOSIS — Z7901 Long term (current) use of anticoagulants: Secondary | ICD-10-CM

## 2015-03-24 DIAGNOSIS — K589 Irritable bowel syndrome without diarrhea: Secondary | ICD-10-CM | POA: Diagnosis not present

## 2015-03-24 DIAGNOSIS — Z86010 Personal history of colonic polyps: Secondary | ICD-10-CM | POA: Diagnosis not present

## 2015-03-24 DIAGNOSIS — R1032 Left lower quadrant pain: Secondary | ICD-10-CM | POA: Diagnosis not present

## 2015-03-24 MED ORDER — DICYCLOMINE HCL 10 MG PO CAPS
10.0000 mg | ORAL_CAPSULE | Freq: Three times a day (TID) | ORAL | Status: DC
Start: 2015-03-24 — End: 2015-04-30

## 2015-03-24 MED ORDER — PANTOPRAZOLE SODIUM 40 MG PO TBEC: 40.0000 mg | DELAYED_RELEASE_TABLET | Freq: Two times a day (BID) | ORAL | Status: DC

## 2015-03-24 NOTE — Progress Notes (Signed)
    History of Present Illness: This is a 74 year old male complaining of intermittent left lower quadrant pain. The symptoms have been intermittently bothersome for over one year. They respond to dicyclomine. He underwent a CT scan of the abdomen/pelvis in October 2015 which was negative for GI findings. His reflux symptoms are well controlled on pantoprazole. He is not interested in undergoing colonoscopy at this time as he is very concerned about stopping anticoagulation and the risk of thromboembolic events.  Current Medications, Allergies, Past Medical History, Past Surgical History, Family History and Social History were reviewed in [employer] record.  Physical Exam: General: Well developed , well nourished, no acute distress Head: Normocephalic and atraumatic Eyes:  sclerae anicteric, EOMI Ears: Normal auditory acuity Mouth: No deformity or lesions Lungs: Clear throughout to auscultation Heart: Regular rate and rhythm; no murmurs, rubs or bruits Abdomen: Soft, non tender and non distended. No masses, hepatosplenomegaly or hernias noted. Normal Bowel sounds Musculoskeletal: Symmetrical with no gross deformities  Pulses:  Normal pulses noted Extremities: No clubbing, cyanosis, edema or deformities noted Neurological: Alert oriented x 4, grossly nonfocal Psychological:  Alert and cooperative. Normal mood and affect  Assessment and Recommendations:  1. GERD with LA class B erosive esophagitis. Maintain all standard antireflux measures and continue pantoprazole 40 mg twice daily.  2. Left lower quadrant pain, intermittent. Symptoms likely related to IBS. Continue dicyclomine 10 mg 3 times a day as needed.  3. Personal history of adenomatous colon polyps. He is 2 years overdue for polyp surveillance. He declines colonoscopy. Given his history of adenomatous polyps polyps he is not appropriate candidate for Cologuard. Schedule air contrast barium enema.  4.  Chronic anticoagulation for history of DVT and PE in July 2015.

## 2015-03-24 NOTE — Patient Instructions (Signed)
We have sent the following medications to your pharmacy for you to pick up at your convenience:Bentyl and pantoprazole.  You are scheduled for a Barium enema x-ray examination on 03/27/15 at 10:30am at [facility].   Be prepared to spend 2 to 2 1/2 hours at the Radiology Department  It is very important that you follow the instructions below.  Failure to follow these instructions may result in a study that is less than optimal and may lead to the necessity of repeating the examination.   Barium Enema Prep  Please purchase the following items from the laxative section of your drug store 10 oz.  Magnesium Citrate Bottle 4 Bisacodyl Tablets-Enteric coated 1 Bisacodyl Suppository   Day before exam  12:00 Lunch. This meal may include clear broth, white chicken meat sandwich (no butter, lettuce or other additives), or two hard boiled eggs, strained fruit juices, jello or gelatin ( not containing fruits or nuts).  Coffee or tea (without milk or cream), carbonated beverages.  1:00 pm Drink one full glass or more of water   3:00 pm Drink one full glass or more of water  5:00 pm  Supper.  It is very important that supper be limited to the items described here Broth, Clear jello/gelatin with no whole foods added (no red), soft drinks, gatorade, water black coffee or tea (no milk or cream), popsicles.   No red jello or popsicles.   7:00 pm drink one full glass of water  8:00 pm Drink the bottle of Magnesium Citrate (drink cold if preferred)  10:00 pm  Take three (3)Dulcolax tablets with one full glass or more of water.   Day of exam  NO breakfast, you may have coffee, tea (without milk or cream), or strained fruit juice 7:00 am unwrap the foil wrapper from bisacodyl suppository and discard the foil.  While lying on your side with thigh elevated, insert the suppository into the rectum and gently push in as far as possible.  Retain the suppository for at least 15 minutes, if possible,  before evacuating, even if the urge is strong. Bowel evacuation usually occurs within 15 to 60 minutes.  Patients requiring assistance should have a bed pan, commode or help readily available.   Report to the radiology department at [facility] on 03/27/15 at 10:30 am.

## 2015-03-25 ENCOUNTER — Telehealth: Payer: Self-pay | Admitting: Internal Medicine

## 2015-03-25 ENCOUNTER — Other Ambulatory Visit (HOSPITAL_BASED_OUTPATIENT_CLINIC_OR_DEPARTMENT_OTHER): Payer: Medicare Other

## 2015-03-25 ENCOUNTER — Encounter: Payer: Self-pay | Admitting: Internal Medicine

## 2015-03-25 ENCOUNTER — Ambulatory Visit (HOSPITAL_BASED_OUTPATIENT_CLINIC_OR_DEPARTMENT_OTHER): Payer: Medicare Other | Admitting: Internal Medicine

## 2015-03-25 ENCOUNTER — Ambulatory Visit (HOSPITAL_BASED_OUTPATIENT_CLINIC_OR_DEPARTMENT_OTHER): Payer: Medicare Other

## 2015-03-25 ENCOUNTER — Telehealth: Payer: Self-pay | Admitting: *Deleted

## 2015-03-25 VITALS — BP 116/66 | HR 60 | Temp 97.7°F | Resp 18 | Ht 69.5 in | Wt 213.1 lb

## 2015-03-25 DIAGNOSIS — C9000 Multiple myeloma not having achieved remission: Secondary | ICD-10-CM

## 2015-03-25 DIAGNOSIS — Z23 Encounter for immunization: Secondary | ICD-10-CM

## 2015-03-25 LAB — COMPREHENSIVE METABOLIC PANEL (CC13)
ALT: 25 U/L (ref 0–55)
ANION GAP: 6 meq/L (ref 3–11)
AST: 26 U/L (ref 5–34)
Albumin: 3.4 g/dL — ABNORMAL LOW (ref 3.5–5.0)
Alkaline Phosphatase: 45 U/L (ref 40–150)
BUN: 13.6 mg/dL (ref 7.0–26.0)
CHLORIDE: 109 meq/L (ref 98–109)
CO2: 27 meq/L (ref 22–29)
CREATININE: 1.2 mg/dL (ref 0.7–1.3)
Calcium: 9 mg/dL (ref 8.4–10.4)
EGFR: 62 mL/min/{1.73_m2} — ABNORMAL LOW (ref >90)
Glucose: 92 mg/dl (ref 70–140)
POTASSIUM: 3.9 meq/L (ref 3.5–5.1)
Sodium: 141 mEq/L (ref 136–145)
Total Bilirubin: 0.72 mg/dL (ref 0.20–1.20)
Total Protein: 6.1 g/dL — ABNORMAL LOW (ref 6.4–8.3)

## 2015-03-25 LAB — CBC WITH DIFFERENTIAL/PLATELET
BASO%: 5.9 % — AB (ref 0.0–2.0)
Basophils Absolute: 0.2 10*3/uL — ABNORMAL HIGH (ref 0.0–0.1)
EOS%: 8.6 % — AB (ref 0.0–7.0)
Eosinophils Absolute: 0.3 10*3/uL (ref 0.0–0.5)
HCT: 39.1 % (ref 38.4–49.9)
HGB: 12.9 g/dL — ABNORMAL LOW (ref 13.0–17.1)
LYMPH%: 20.3 % (ref 14.0–49.0)
MCH: 31.4 pg (ref 27.2–33.4)
MCHC: 33.1 g/dL (ref 32.0–36.0)
MCV: 94.7 fL (ref 79.3–98.0)
MONO#: 0.4 10*3/uL (ref 0.1–0.9)
MONO%: 11 % (ref 0.0–14.0)
NEUT#: 2.1 10*3/uL (ref 1.5–6.5)
NEUT%: 54.2 % (ref 39.0–75.0)
Platelets: 149 10*3/uL (ref 140–400)
RBC: 4.13 10*6/uL — AB (ref 4.20–5.82)
RDW: 15.8 % — ABNORMAL HIGH (ref 11.0–14.6)
WBC: 3.8 10*3/uL — ABNORMAL LOW (ref 4.0–10.3)
lymph#: 0.8 10*3/uL — ABNORMAL LOW (ref 0.9–3.3)

## 2015-03-25 LAB — LACTATE DEHYDROGENASE (CC13): LDH: 138 U/L (ref 125–245)

## 2015-03-25 MED ORDER — INFLUENZA VAC SPLIT QUAD 0.5 ML IM SUSY
0.5000 mL | PREFILLED_SYRINGE | Freq: Once | INTRAMUSCULAR | Status: AC
  Administered 2015-03-25: 0.5 mL via INTRAMUSCULAR
  Filled 2015-03-25: qty 0.5

## 2015-03-25 MED ORDER — METHYLPREDNISOLONE 4 MG PO TBPK: ORAL_TABLET | ORAL | Status: DC

## 2015-03-25 MED ORDER — OXYCODONE-ACETAMINOPHEN 5-325 MG PO TABS: 1.0000 | ORAL_TABLET | ORAL | Status: DC | PRN

## 2015-03-25 MED ORDER — TEMAZEPAM 15 MG PO CAPS: 15.0000 mg | ORAL_CAPSULE | Freq: Every evening | ORAL | Status: DC | PRN

## 2015-03-25 NOTE — Telephone Encounter (Signed)
Patient called reporting he is going out of town tomorrow morning.  "Can you have Dr. [name] send the order for the steroid to [facility] on [address] to I can get this this evening."

## 2015-03-25 NOTE — Telephone Encounter (Signed)
Pt confirmed labs/ov per 09/14 POF.... Gave pt AVS and Calendar... KJ

## 2015-03-25 NOTE — Progress Notes (Signed)
[facility] [phone]   Fax:([phone]) [place], [place] [address]  DIAGNOSIS: Stage IIA multiple myeloma diagnosed in July of 2013.   PRIOR THERAPY:  1) Systemic chemotherapy with subcutaneous Velcade 1.3 mg/M2 on days 1, 4, 8 and 11 every 3 weeks in addition to Revlimid 25 mg by mouth daily for 14 days every 3 weeks and dexamethasone 40 mg on a weekly basis. Status post 4 planned cycles.  2) Systemic chemotherapy with subcutaneous Velcade 1.3 mg/M2 on days 1, 4, 8 and 11 every 3 weeks in addition to Revlimid 25 mg by mouth daily for 14 days every 3 weeks and dexamethasone 40 mg on a weekly basis. 4 more cycles are planned and he is status post 4 cycles of the second set of 4 cycles, now status post a total of 10 cycles. 3) Systemic chemotherapy with Carfilzomib 36 mg/M2 on days 1, 2, 8, 9, 15 and 16 in addition to cyclophosphamide 300 mg/M2 IV on days 1, 8 and 15 as well as dexamethasone on days 1, 8, 15 and 22 every 4 weeks. Status post 4 cycles. 4) Zometa 4 mg IV every month. 5) autologous peripheral blood stem cell transplant on 03/20/2013 at [facility]. 6) Consolidation chemotherapy with Carfilzomib 36 mg/M2 on days 1, 2, 8, 9, 15 and 16 every 4 weeks in addition to cyclophosphamide 300 mg/M2 on days 1, 8 and 15 as well as dexamethasone 40 mg on a weekly basis every 4 weeks. Status post 2 cycles. First cycle 06/11/2013. 7) Maintenance therapy with Revlimid 10 mg by mouth daily status post 3 months of treatment. 8) palliative radiotherapy to the lytic lesion in the skull under the care of Dr. Sondra Come 9) Maintenance therapy with Revlimid 15 mg by mouth daily started 11/09/2013 and discontinued 02/18/2015 secondary to persistent neutropenia.   CURRENT THERAPY:  1) Maintenance therapy with Revlimid 10 mg by mouth daily started 02/19/2015. 2) Zometa 4 mg IV every 2 months. 3) Xarelto 20 mg by  mouth daily for pulmonary embolism.   INTERVAL HISTORY: [name] 74 y.o. male returns to the clinic today for routine monthly followup visit. The patient is feeling fine today with no specific complaints. He is tolerating his current treatment with Revlimid 10 mg by mouth daily much better. He complains of low back pain as well as pain on the skull area where he received radiotherapy before. He is requesting pain medication. He denied having any significant fatigue or weakness. He denied having any fever or chills. He has no nausea or vomiting.  He denied having any significant chest pain, shortness of breath, cough or hemoptysis. He had repeat CBC, comprehensive metabolic panel and LDH performed earlier today and he is here for evaluation and discussion of his lab results.  MEDICAL HISTORY: Past Medical History  Diagnosis Date  . GERD (gastroesophageal reflux disease)   . Adenomatous polyp of colon 1996  . Arthritis     Osteoarthritis right knee, spine, wrist  . Benign prostatic hypertrophy     (Dr. Karsten Ro in past)  . History of chronic prostatitis   . History of melanoma     Back (Dr. Wilhemina Bonito); early melanoma R arm 03/2011  . Diverticulosis   . Arthritis of hand, right     Right thumb (Dr. Daylene Katayama)  . Degenerative disc disease   . Hemorrhoids   . Erosive esophagitis   .  Back ache     r/o Multiple Myeloma  . Multiple myeloma 2013    skull lytic lesions--treated with radiation 07/2014  . DVT (deep venous thrombosis) 01/2014    R calf  . Pulmonary embolism 01/2014  . Radiation 07/21/14-08/05/14    left occipital parietal skull 25 gray  . Hiatal hernia     ALLERGIES:  is allergic to penicillins.  MEDICATIONS:  Current Outpatient Prescriptions  Medication Sig Dispense Refill  . acetaminophen (TYLENOL) 500 MG tablet Take 1,000 mg by mouth every 8 (eight) hours as needed.    Marland Kitchen ascorbic acid (VITAMIN C) 500 MG tablet Take 500 mg by mouth daily.    . B Complex-C (B-COMPLEX  WITH VITAMIN C) tablet Take 1 tablet by mouth daily.    . cholecalciferol (VITAMIN D) 1000 UNITS tablet Take 1,000 Units by mouth daily.    Marland Kitchen dicyclomine (BENTYL) 10 MG capsule Take 1 capsule (10 mg total) by mouth 3 (three) times daily before meals. 90 capsule 11  . fish oil-omega-3 fatty acids 1000 MG capsule Take 1 g by mouth daily.     . furosemide (LASIX) 20 MG tablet Take 1 tablet (20 mg total) by mouth daily. 14 tablet 1  . LORazepam (ATIVAN) 1 MG tablet Take 1 tablet (1 mg total) by mouth every 8 (eight) hours as needed for anxiety. 30 tablet 0  . methocarbamol (ROBAXIN) 500 MG tablet Take 1-2 tablets (500-1,000 mg total) by mouth every 6 (six) hours as needed for muscle spasms. 60 tablet 0  . Multiple Vitamin (MULTIVITAMIN WITH MINERALS) TABS Take 1 tablet by mouth daily.    . ondansetron (ZOFRAN) 4 MG tablet Take 1 tab every 6 hours as needed for nausea 40 tablet 1  . oxyCODONE-acetaminophen (PERCOCET) 10-325 MG per tablet Take 1 tablet by mouth every 6 (six) hours as needed for pain. 60 tablet 0  . pantoprazole (PROTONIX) 40 MG tablet Take 1 tablet (40 mg total) by mouth 2 (two) times daily. 60 tablet 11  . Probiotic Product (ALIGN) 4 MG CAPS Take 1 capsule by mouth daily.    . prochlorperazine (COMPAZINE) 10 MG tablet Take 1 tablet (10 mg total) by mouth every 6 (six) hours as needed. 30 tablet 1  . REVLIMID 10 MG capsule Take 1 capsule (10 mg total) by mouth daily. 28 capsule 0  . rivaroxaban (XARELTO) 20 MG TABS tablet Take 1 tablet (20 mg total) by mouth daily with supper. 30 tablet 1  . tadalafil (CIALIS) 20 MG tablet Take 1 tablet (20 mg total) by mouth daily as needed for erectile dysfunction. 30 tablet 5  . temazepam (RESTORIL) 15 MG capsule Take 1 capsule (15 mg total) by mouth at bedtime as needed. 30 capsule 0  . traMADol (ULTRAM) 50 MG tablet Take 1-2 tablets (50-100 mg total) by mouth every 6 (six) hours as needed for severe pain. 30 tablet 1  . zolendronic acid (ZOMETA) 4  MG/5ML injection Inject 4 mg into the vein every 28 (twenty-eight) days.     No current facility-administered medications for this visit.   Facility-Administered Medications Ordered in Other Visits  Medication Dose Route Frequency Provider Last Rate Last Dose  . ondansetron (ZOFRAN) tablet 8 mg  8 mg Oral Once Curt Bears, MD        SURGICAL HISTORY:  Past Surgical History  Procedure Laterality Date  . Total knee replaced  2000    Left knee  . Appendectomy    . Colonoscopy  2009  adenomatous polyp  . Esophagogastroduodenoscopy  2009    mild inflammation at esophagogastric junction  . Excision of melanoma      back ( x3)  . Great toe surgery      bilateral  . Inguinal hernia repair      left, x 2  . Total knee arthroplasty  06/08/2011    Procedure: TOTAL KNEE ARTHROPLASTY;  Surgeon: Ninetta Lights, MD;  Location: [facility];  Service: Orthopedics;  Laterality: Right;  osteonics  . [place]  10/2012  . Bone marrow transplant  03/2013    Stem Cell    REVIEW OF SYSTEMS:  Constitutional: negative Eyes: negative Ears, nose, mouth, throat, and face: negative Respiratory: negative Cardiovascular: negative Gastrointestinal: negative Genitourinary:negative Integument/breast: negative Hematologic/lymphatic: negative Musculoskeletal:positive for back pain Neurological: negative Behavioral/Psych: negative Endocrine: negative Allergic/Immunologic: negative   PHYSICAL EXAMINATION: General appearance: alert, cooperative and no distress Head: Normocephalic, without obvious abnormality, atraumatic Neck: no adenopathy, no JVD, supple, symmetrical, trachea midline and thyroid not enlarged, symmetric, no tenderness/mass/nodules Lymph nodes: Cervical, supraclavicular, and axillary nodes normal. Resp: clear to auscultation bilaterally Back: symmetric, no curvature. ROM normal. No CVA tenderness. Cardio: regular rate and rhythm, S1, S2 normal, no murmur, click, rub or gallop GI:  soft, non-tender; bowel sounds normal; no masses,  no organomegaly Extremities: extremities normal, atraumatic, no cyanosis or edema Neurologic: Alert and oriented X 3, normal strength and tone. Normal symmetric reflexes. Normal coordination and gait  ECOG PERFORMANCE STATUS: 1 - Symptomatic but completely ambulatory  Blood pressure 116/66, pulse 60, temperature 97.7 F (36.5 C), temperature source Oral, resp. rate 18, height 5' 9.5" (1.765 m), weight 213 lb 1.6 oz (96.662 kg), SpO2 98 %.  LABORATORY DATA: Lab Results  Component Value Date   WBC 3.8* 03/25/2015   HGB 12.9* 03/25/2015   HCT 39.1 03/25/2015   MCV 94.7 03/25/2015   PLT 149 03/25/2015      Chemistry      Component Value Date/Time   NA 140 02/24/2015 0758   NA 139 04/22/2014 1417   K 4.0 02/24/2015 0758   K 3.9 04/22/2014 1417   CL 107 04/22/2014 1417   CL 109* 12/31/2012 0759   CO2 24 02/24/2015 0758   CO2 22 04/22/2014 1417   BUN 11.6 02/24/2015 0758   BUN 10 04/22/2014 1417   CREATININE 1.0 02/24/2015 0758   CREATININE 1.1 04/22/2014 1417   CREATININE 0.97 11/18/2013 1045      Component Value Date/Time   CALCIUM 9.2 02/24/2015 0758   CALCIUM 8.8 04/22/2014 1417   ALKPHOS 45 02/24/2015 0758   ALKPHOS 47 04/22/2014 1417   AST 20 02/24/2015 0758   AST 18 04/22/2014 1417   ALT 24 02/24/2015 0758   ALT 22 04/22/2014 1417   BILITOT 0.68 02/24/2015 0758   BILITOT 0.8 04/22/2014 1417      RADIOGRAPHIC STUDIES: No results found.  ASSESSMENT AND PLAN: This is a very pleasant 74 years old white male with history of multiple myeloma status post induction chemotherapy with Velcade, Revlimid and dexamethasone followed by treatment with Carfilzomib, cyclophosphamide and dexamethasone with significant improvement in his disease followed by autologous peripheral blood stem cell transplant at [facility] on 03/20/2013. This was followed by 2 cycles of consolidation chemotherapy with Carfilzomib, Cytoxan and  dexamethasone, and he tolerated it fairly well. He completed maintenance Revlimid 10 mg by mouth daily for 3 months and tolerating it fairly well. He started maintenance Revlimid with 15 mg by mouth daily in  May 2015 and tolerating it well except for the persistent neutropenia.  He started the reduced dose of Revlimid 10 mg by mouth daily on 02/19/2015. He is tolerating this treatment much better and his CBC today showed improvement of his total white blood count. I discussed the lab result with the patient today. I recommended for him to continue his current treatment with Revlimid 10 mg by mouth daily. For the low back pain as well as the pain in the skull area, I will arrange for the patient to have repeat skeletal bone survey. I also started him on Medrol Dosepak. The patient was also given prescription for Percocet 5/325 mg by mouth every 6 hours as needed for pain. He will continue treatment with Zometa every 2 months.  For the pulmonary embolism, he will continue on Xarelto 20 mg by mouth daily as scheduled.  For insomnia he was giving a refill of Restoril. The patient also received flu vaccine today. He would come back for follow-up visit in one month for reevaluation with repeat blood work. He was advised to call immediately if he has any concerning symptoms in the interval. The patient voices understanding of current disease status and treatment options and is in agreement with the current care plan.  All questions were answered. The patient knows to call the clinic with any problems, questions or concerns. We can certainly see the patient much sooner if necessary.  Disclaimer: This note was dictated with voice recognition software. Similar sounding words can inadvertently be transcribed and may not be corrected upon review.

## 2015-03-26 ENCOUNTER — Other Ambulatory Visit: Payer: Self-pay | Admitting: Family Medicine

## 2015-03-26 NOTE — Telephone Encounter (Signed)
Patient reports he is out of town in [place], [place] and he forgot to bring his methocarbamol.  There is no refill available to transfer, so asking for refill to the local [facility] at [phone].  Review of chart shows it was last filled #60 on 03/18/14 (1 year ago).  Refill done

## 2015-03-27 ENCOUNTER — Ambulatory Visit (HOSPITAL_COMMUNITY): Payer: Medicare Other

## 2015-03-29 ENCOUNTER — Other Ambulatory Visit: Payer: Self-pay | Admitting: Internal Medicine

## 2015-03-31 ENCOUNTER — Ambulatory Visit (HOSPITAL_COMMUNITY): Payer: Medicare Other

## 2015-03-31 ENCOUNTER — Ambulatory Visit (HOSPITAL_COMMUNITY)
Admission: RE | Admit: 2015-03-31 | Discharge: 2015-03-31 | Disposition: A | Discharge status: Home / Self Care | Payer: Medicare Other | Source: Ambulatory Visit | Attending: Internal Medicine | Admitting: Internal Medicine

## 2015-03-31 DIAGNOSIS — C9000 Multiple myeloma not having achieved remission: Secondary | ICD-10-CM

## 2015-03-31 DIAGNOSIS — R51 Headache: Secondary | ICD-10-CM | POA: Diagnosis not present

## 2015-03-31 DIAGNOSIS — M549 Dorsalgia, unspecified: Secondary | ICD-10-CM | POA: Diagnosis not present

## 2015-04-02 ENCOUNTER — Telehealth: Payer: Self-pay | Admitting: *Deleted

## 2015-04-02 ENCOUNTER — Ambulatory Visit (HOSPITAL_COMMUNITY): Payer: Medicare Other

## 2015-04-02 NOTE — Telephone Encounter (Signed)
Message received and sent to [facility]

## 2015-04-02 NOTE — Telephone Encounter (Signed)
PT. WOULD LIKE DR.[name]'S INPUT CONCERNING BONE SURVEY.

## 2015-04-02 NOTE — Telephone Encounter (Signed)
Ok to do it 

## 2015-04-03 ENCOUNTER — Telehealth: Payer: Self-pay | Admitting: *Deleted

## 2015-04-03 ENCOUNTER — Other Ambulatory Visit: Payer: Self-pay | Admitting: Medical Oncology

## 2015-04-03 NOTE — Telephone Encounter (Signed)
Per [name] scan is stable. Pt notified of bone scan results.

## 2015-04-03 NOTE — Telephone Encounter (Signed)
Patient would like to know the results of his recent bone scan. Message sent to MD [name]/RN Diane

## 2015-04-06 ENCOUNTER — Telehealth: Payer: Self-pay | Admitting: *Deleted

## 2015-04-06 ENCOUNTER — Other Ambulatory Visit: Payer: Self-pay | Admitting: *Deleted

## 2015-04-06 DIAGNOSIS — C9000 Multiple myeloma not having achieved remission: Secondary | ICD-10-CM

## 2015-04-06 MED ORDER — PANTOPRAZOLE SODIUM 40 MG PO TBEC: 40.0000 mg | DELAYED_RELEASE_TABLET | Freq: Two times a day (BID) | ORAL | Status: DC

## 2015-04-06 MED ORDER — LENALIDOMIDE 10 MG PO CAPS: 10.0000 mg | ORAL_CAPSULE | Freq: Every day | ORAL | Status: DC

## 2015-04-06 MED ORDER — REVLIMID 10 MG PO CAPS: 10.0000 mg | ORAL_CAPSULE | Freq: Every day | ORAL | Status: DC

## 2015-04-06 NOTE — Telephone Encounter (Signed)
Voicemail from patient forwarded to collaborative nurse.  "Revlimid sent to [facility].  Needs to be sent to [facility]."

## 2015-04-06 NOTE — Telephone Encounter (Signed)
Revlimid order sent eRx for faster service through [facility] with request for electronic signature to provider.

## 2015-04-06 NOTE — Telephone Encounter (Signed)
VOICE MAIL AT 2:39PM PT. CALLED TO CHECK TO SEE IF HIS REVLIMID PRESCRIPTION WAS SENT TO [facility]. AT 4:25PM LEFT PT. A MESSAGE THAT HIS REVLIMID PRESCRIPTION HAS BEEN SENT TO [facility].

## 2015-04-16 ENCOUNTER — Other Ambulatory Visit (HOSPITAL_COMMUNITY): Payer: Self-pay | Admitting: Nurse Practitioner

## 2015-04-16 ENCOUNTER — Encounter (HOSPITAL_COMMUNITY): Payer: Self-pay

## 2015-04-16 ENCOUNTER — Ambulatory Visit (HOSPITAL_COMMUNITY)
Admission: RE | Admit: 2015-04-16 | Discharge: 2015-04-16 | Disposition: A | Discharge status: Home / Self Care | Payer: Medicare Other | Source: Ambulatory Visit | Attending: Nurse Practitioner | Admitting: Nurse Practitioner

## 2015-04-16 DIAGNOSIS — S61401A Unspecified open wound of right hand, initial encounter: Secondary | ICD-10-CM | POA: Diagnosis not present

## 2015-04-16 DIAGNOSIS — X58XXXA Exposure to other specified factors, initial encounter: Secondary | ICD-10-CM | POA: Insufficient documentation

## 2015-04-16 DIAGNOSIS — S61411A Laceration without foreign body of right hand, initial encounter: Secondary | ICD-10-CM | POA: Diagnosis not present

## 2015-04-16 DIAGNOSIS — S0990XA Unspecified injury of head, initial encounter: Secondary | ICD-10-CM | POA: Insufficient documentation

## 2015-04-16 DIAGNOSIS — S01111A Laceration without foreign body of right eyelid and periocular area, initial encounter: Secondary | ICD-10-CM | POA: Diagnosis not present

## 2015-04-16 DIAGNOSIS — S0011XA Contusion of right eyelid and periocular area, initial encounter: Secondary | ICD-10-CM | POA: Diagnosis not present

## 2015-04-21 ENCOUNTER — Ambulatory Visit: Payer: Medicare Other

## 2015-04-21 DIAGNOSIS — S0083XD Contusion of other part of head, subsequent encounter: Secondary | ICD-10-CM | POA: Diagnosis not present

## 2015-04-21 DIAGNOSIS — S0181XD Laceration without foreign body of other part of head, subsequent encounter: Secondary | ICD-10-CM | POA: Diagnosis not present

## 2015-04-21 DIAGNOSIS — L988 Other specified disorders of the skin and subcutaneous tissue: Secondary | ICD-10-CM | POA: Diagnosis not present

## 2015-04-28 ENCOUNTER — Other Ambulatory Visit (HOSPITAL_BASED_OUTPATIENT_CLINIC_OR_DEPARTMENT_OTHER): Payer: Medicare Other

## 2015-04-28 ENCOUNTER — Ambulatory Visit (HOSPITAL_BASED_OUTPATIENT_CLINIC_OR_DEPARTMENT_OTHER): Payer: Medicare Other | Admitting: Internal Medicine

## 2015-04-28 ENCOUNTER — Ambulatory Visit (HOSPITAL_COMMUNITY)
Admission: RE | Admit: 2015-04-28 | Discharge: 2015-04-28 | Disposition: A | Discharge status: Home / Self Care | Payer: Medicare Other | Source: Ambulatory Visit | Attending: Internal Medicine | Admitting: Internal Medicine

## 2015-04-28 ENCOUNTER — Telehealth: Payer: Self-pay | Admitting: Medical Oncology

## 2015-04-28 ENCOUNTER — Encounter: Payer: Self-pay | Admitting: Internal Medicine

## 2015-04-28 ENCOUNTER — Telehealth: Payer: Self-pay | Admitting: Internal Medicine

## 2015-04-28 ENCOUNTER — Ambulatory Visit (HOSPITAL_BASED_OUTPATIENT_CLINIC_OR_DEPARTMENT_OTHER): Payer: Medicare Other

## 2015-04-28 VITALS — BP 112/74 | HR 62 | Temp 98.0°F | Resp 20 | Ht 69.5 in | Wt 215.0 lb

## 2015-04-28 DIAGNOSIS — G4700 Insomnia, unspecified: Secondary | ICD-10-CM

## 2015-04-28 DIAGNOSIS — R0789 Other chest pain: Secondary | ICD-10-CM

## 2015-04-28 DIAGNOSIS — R918 Other nonspecific abnormal finding of lung field: Secondary | ICD-10-CM | POA: Diagnosis not present

## 2015-04-28 DIAGNOSIS — I825Z9 Chronic embolism and thrombosis of unspecified deep veins of unspecified distal lower extremity: Secondary | ICD-10-CM

## 2015-04-28 DIAGNOSIS — C9000 Multiple myeloma not having achieved remission: Secondary | ICD-10-CM

## 2015-04-28 DIAGNOSIS — R079 Chest pain, unspecified: Secondary | ICD-10-CM | POA: Insufficient documentation

## 2015-04-28 DIAGNOSIS — Z86711 Personal history of pulmonary embolism: Secondary | ICD-10-CM | POA: Diagnosis not present

## 2015-04-28 LAB — COMPREHENSIVE METABOLIC PANEL (CC13)
ALBUMIN: 3.3 g/dL — AB (ref 3.5–5.0)
ALK PHOS: 53 U/L (ref 40–150)
ALT: 24 U/L (ref 0–55)
ANION GAP: 7 meq/L (ref 3–11)
AST: 19 U/L (ref 5–34)
BILIRUBIN TOTAL: 0.6 mg/dL (ref 0.20–1.20)
BUN: 12.8 mg/dL (ref 7.0–26.0)
CALCIUM: 9.5 mg/dL (ref 8.4–10.4)
CO2: 24 mEq/L (ref 22–29)
Chloride: 108 mEq/L (ref 98–109)
Creatinine: 1.1 mg/dL (ref 0.7–1.3)
EGFR: 69 mL/min/{1.73_m2} — AB (ref >90)
Glucose: 123 mg/dl (ref 70–140)
POTASSIUM: 3.9 meq/L (ref 3.5–5.1)
SODIUM: 140 meq/L (ref 136–145)
TOTAL PROTEIN: 6.3 g/dL — AB (ref 6.4–8.3)

## 2015-04-28 LAB — CBC WITH DIFFERENTIAL/PLATELET
BASO%: 6.4 % — ABNORMAL HIGH (ref 0.0–2.0)
BASOS ABS: 0.2 10*3/uL — AB (ref 0.0–0.1)
EOS ABS: 0.3 10*3/uL (ref 0.0–0.5)
EOS%: 11.3 % — AB (ref 0.0–7.0)
HEMATOCRIT: 39.5 % (ref 38.4–49.9)
HEMOGLOBIN: 13.4 g/dL (ref 13.0–17.1)
LYMPH#: 0.7 10*3/uL — AB (ref 0.9–3.3)
LYMPH%: 25.7 % (ref 14.0–49.0)
MCH: 31.8 pg (ref 27.2–33.4)
MCHC: 33.9 g/dL (ref 32.0–36.0)
MCV: 93.6 fL (ref 79.3–98.0)
MONO#: 0.3 10*3/uL (ref 0.1–0.9)
MONO%: 10.2 % (ref 0.0–14.0)
NEUT#: 1.2 10*3/uL — ABNORMAL LOW (ref 1.5–6.5)
NEUT%: 46.4 % (ref 39.0–75.0)
NRBC: 0 % (ref 0–0)
PLATELETS: 166 10*3/uL (ref 140–400)
RBC: 4.22 10*6/uL (ref 4.20–5.82)
RDW: 14.8 % — AB (ref 11.0–14.6)
WBC: 2.7 10*3/uL — ABNORMAL LOW (ref 4.0–10.3)

## 2015-04-28 LAB — LACTATE DEHYDROGENASE (CC13): LDH: 136 U/L (ref 125–245)

## 2015-04-28 MED ORDER — OXYCODONE-ACETAMINOPHEN 5-325 MG PO TABS: 1.0000 | ORAL_TABLET | ORAL | Status: DC | PRN

## 2015-04-28 MED ORDER — ZOLEDRONIC ACID 4 MG/100ML IV SOLN
4.0000 mg | Freq: Once | INTRAVENOUS | Status: AC
  Administered 2015-04-28: 4 mg via INTRAVENOUS
  Filled 2015-04-28: qty 100

## 2015-04-28 MED ORDER — RIVAROXABAN 20 MG PO TABS: 20.0000 mg | ORAL_TABLET | Freq: Every day | ORAL | Status: DC

## 2015-04-28 NOTE — Patient Instructions (Signed)

## 2015-04-28 NOTE — Telephone Encounter (Signed)
Gave and pritned appt sched and avs for pt for NOV °

## 2015-04-28 NOTE — Telephone Encounter (Signed)
Called in [place] #[phone] to [facility]

## 2015-04-28 NOTE — Progress Notes (Signed)
    [facility] [phone]   [phone]  OFFICE PROGRESS NOTE  [name],[name], [name] [address] [place] [address]  DIAGNOSIS: Stage IIA multiple myeloma diagnosed in July of 2013.   PRIOR THERAPY:  1) Systemic chemotherapy with subcutaneous Velcade 1.3 mg/M2 on days 1, 4, 8 and 11 every 3 weeks in addition to Revlimid 25 mg by mouth daily for 14 days every 3 weeks and dexamethasone 40 mg on [name] weekly basis. Status post 4 planned cycles.  2) Systemic chemotherapy with subcutaneous Velcade 1.3 mg/M2 on days 1, 4, 8 and 11 every 3 weeks in addition to Revlimid 25 mg by mouth daily for 14 days every 3 weeks and dexamethasone 40 mg on [name] weekly basis. 4 more cycles are planned and he is status post 4 cycles of the second set of 4 cycles, now status post [name] total of 10 cycles. 3) Systemic chemotherapy with Carfilzomib 36 mg/M2 on days 1, 2, 8, 9, 15 and 16 in addition to cyclophosphamide 300 mg/M2 IV on days 1, 8 and 15 as well as dexamethasone on days 1, 8, 15 and 22 every 4 weeks. Status post 4 cycles. 4) Zometa 4 mg IV every month. 5) autologous peripheral blood stem cell transplant on 03/20/2013 at [facility]. 6) Consolidation chemotherapy with Carfilzomib 36 mg/M2 on days 1, 2, 8, 9, 15 and 16 every 4 weeks in addition to cyclophosphamide 300 mg/M2 on days 1, 8 and 15 as well as dexamethasone 40 mg on [name] weekly basis every 4 weeks. Status post 2 cycles. First cycle 06/11/2013. 7) Maintenance therapy with Revlimid 10 mg by mouth daily status post 3 months of treatment. 8) palliative radiotherapy to the lytic lesion in the skull under the care of Dr. Kinard 9) Maintenance therapy with Revlimid 15 mg by mouth daily started 11/09/2013 and discontinued 02/18/2015 secondary to persistent neutropenia.   CURRENT THERAPY:  1) Maintenance therapy with Revlimid 10 mg by mouth daily started 02/19/2015. 2) Zometa 4 mg IV every 2 months. 3) Xarelto 20 mg by  mouth daily for pulmonary embolism.   INTERVAL HISTORY: [name] 74 y.o. male returns to the clinic today for routine monthly followup visit. The patient is feeling fine today with no specific complaints except for ecchymosis above the right eye after [name] fall on 04/16/2015. He was seen at the emergency department had CT scan of the head performed that showed no concerning findings. The patient also has pain on the chest but he did not have chest x-ray at that time. He is tolerating his current treatment with Revlimid 10 mg by mouth daily much better. He is requesting pain medication. He denied having any significant fatigue or weakness. He denied having any fever or chills. He has no nausea or vomiting.  He denied having any significant chest pain, shortness of breath, cough or hemoptysis. He had repeat myeloma panel performed earlier today and he is here for evaluation and discussion of his lab results.  MEDICAL HISTORY: Past Medical History  Diagnosis Date  . GERD (gastroesophageal reflux disease)   . Adenomatous polyp of colon 1996  . Arthritis     Osteoarthritis right knee, spine, wrist  . Benign prostatic hypertrophy     (Dr. Ottelin in past)  . History of chronic prostatitis   . History of melanoma     Back (Dr. Dan Jones); early melanoma R arm 03/2011  . Diverticulosis   . Arthritis of hand,   right     Right thumb (Dr. Sypher)  . Degenerative disc disease   . Hemorrhoids   . Erosive esophagitis   . Back ache     r/o Multiple Myeloma  . Multiple myeloma (HCC) 2013    skull lytic lesions--treated with radiation 07/2014  . DVT (deep venous thrombosis) (HCC) 01/2014    R calf  . Pulmonary embolism (HCC) 01/2014  . Radiation 07/21/14-08/05/14    left occipital parietal skull 25 gray  . Hiatal hernia     ALLERGIES:  is allergic to penicillins.  MEDICATIONS:  Current Outpatient Prescriptions  Medication Sig Dispense Refill  . acetaminophen (TYLENOL) 500 MG tablet Take 1,000  mg by mouth every 8 (eight) hours as needed.    . ascorbic acid (VITAMIN C) 500 MG tablet Take 500 mg by mouth daily.    . B Complex-C (B-COMPLEX WITH VITAMIN C) tablet Take 1 tablet by mouth daily.    . cholecalciferol (VITAMIN D) 1000 UNITS tablet Take 1,000 Units by mouth daily.    . dicyclomine (BENTYL) 10 MG capsule Take 1 capsule (10 mg total) by mouth 3 (three) times daily before meals. 90 capsule 11  . fish oil-omega-3 fatty acids 1000 MG capsule Take 1 g by mouth daily.     . furosemide (LASIX) 20 MG tablet Take 1 tablet (20 mg total) by mouth daily. (Patient not taking: Reported on 03/25/2015) 14 tablet 1  . lenalidomide (REVLIMID) 10 MG capsule Take 1 capsule (10 mg total) by mouth daily. 28 capsule 0  . LORazepam (ATIVAN) 1 MG tablet Take 1 tablet (1 mg total) by mouth every 8 (eight) hours as needed for anxiety. 30 tablet 0  . methocarbamol (ROBAXIN) 500 MG tablet TAKE 1-2 TABLETS BY MOUTH EVERY 6 HOURS AS NEEDED FOR MUSCLE SPASMS 60 tablet 0  . methylPREDNISolone (MEDROL DOSEPAK) 4 MG TBPK tablet Use as instructed 21 tablet 0  . Multiple Vitamin (MULTIVITAMIN WITH MINERALS) TABS Take 1 tablet by mouth daily.    . ondansetron (ZOFRAN) 4 MG tablet Take 1 tab every 6 hours as needed for nausea (Patient not taking: Reported on 03/25/2015) 40 tablet 1  . oxyCODONE-acetaminophen (PERCOCET/ROXICET) 5-325 MG per tablet Take 1 tablet by mouth every 4 (four) hours as needed for severe pain. 30 tablet 0  . pantoprazole (PROTONIX) 40 MG tablet Take 1 tablet (40 mg total) by mouth 2 (two) times daily. 60 tablet 11  . Probiotic Product (ALIGN) 4 MG CAPS Take 1 capsule by mouth daily.    . prochlorperazine (COMPAZINE) 10 MG tablet Take 1 tablet (10 mg total) by mouth every 6 (six) hours as needed. (Patient not taking: Reported on 03/25/2015) 30 tablet 1  . tadalafil (CIALIS) 20 MG tablet Take 1 tablet (20 mg total) by mouth daily as needed for erectile dysfunction. 30 tablet 5  . temazepam (RESTORIL)  15 MG capsule Take 1 capsule (15 mg total) by mouth at bedtime as needed. 30 capsule 0  . traMADol (ULTRAM) 50 MG tablet Take 1-2 tablets (50-100 mg total) by mouth every 6 (six) hours as needed for severe pain. (Patient not taking: Reported on 03/25/2015) 30 tablet 1  . XARELTO 20 MG TABS tablet TAKE 1 TABLET(20 MG) BY MOUTH DAILY WITH DINNER 30 tablet 0  . zolendronic acid (ZOMETA) 4 MG/5ML injection Inject 4 mg into the vein every 28 (twenty-eight) days.     No current facility-administered medications for this visit.   Facility-Administered Medications Ordered in Other Visits    Medication Dose Route Frequency Provider Last Rate Last Dose  . ondansetron (ZOFRAN) tablet 8 mg  8 mg Oral Once [name] [name], [name]        SURGICAL HISTORY:  Past Surgical History  Procedure Laterality Date  . Total knee replaced  2000    Left knee  . Appendectomy    . Colonoscopy  2009    adenomatous polyp  . Esophagogastroduodenoscopy  2009    mild inflammation at esophagogastric junction  . Excision of melanoma      back ( x3)  . Great toe surgery      bilateral  . Inguinal hernia repair      left, x 2  . Total knee arthroplasty  06/08/2011    Procedure: TOTAL KNEE ARTHROPLASTY;  Surgeon: Daniel F Murphy, [name];  Location: [facility] OR;  Service: Orthopedics;  Laterality: Right;  osteonics  . [place]  10/2012  . Bone marrow transplant  03/2013    Stem Cell    REVIEW OF SYSTEMS:  Constitutional: negative Eyes: negative Ears, nose, mouth, throat, and face: negative Respiratory: positive for pleurisy/chest pain Cardiovascular: negative Gastrointestinal: negative Genitourinary:negative Integument/breast: negative Hematologic/lymphatic: negative Musculoskeletal:positive for back pain Neurological: negative Behavioral/Psych: negative Endocrine: negative Allergic/Immunologic: negative   PHYSICAL EXAMINATION: General appearance: alert, cooperative and no distress Head: Normocephalic, without obvious  abnormality, atraumatic, Ecchymosis at the right frontal area of the face. Neck: no adenopathy, no JVD, supple, symmetrical, trachea midline and thyroid not enlarged, symmetric, no tenderness/mass/nodules Lymph nodes: Cervical, supraclavicular, and axillary nodes normal. Resp: clear to auscultation bilaterally Back: symmetric, no curvature. ROM normal. No CVA tenderness. Cardio: regular rate and rhythm, S1, S2 normal, no murmur, click, rub or gallop GI: soft, non-tender; bowel sounds normal; no masses,  no organomegaly Extremities: extremities normal, atraumatic, no cyanosis or edema Neurologic: Alert and oriented X 3, normal strength and tone. Normal symmetric reflexes. Normal coordination and gait  ECOG PERFORMANCE STATUS: 1 - Symptomatic but completely ambulatory  Blood pressure 112/74, pulse 62, temperature 98 F (36.7 C), temperature source Oral, resp. rate 20, height 5' 9.5" (1.765 m), weight 215 lb (97.523 kg), SpO2 97 %.  LABORATORY DATA: Lab Results  Component Value Date   WBC 3.8* 03/25/2015   HGB 12.9* 03/25/2015   HCT 39.1 03/25/2015   MCV 94.7 03/25/2015   PLT 149 03/25/2015      Chemistry      Component Value Date/Time   NA 141 03/25/2015 1425   NA 139 04/22/2014 1417   K 3.9 03/25/2015 1425   K 3.9 04/22/2014 1417   CL 107 04/22/2014 1417   CL 109* 12/31/2012 0759   CO2 27 03/25/2015 1425   CO2 22 04/22/2014 1417   BUN 13.6 03/25/2015 1425   BUN 10 04/22/2014 1417   CREATININE 1.2 03/25/2015 1425   CREATININE 1.1 04/22/2014 1417   CREATININE 0.97 11/18/2013 1045      Component Value Date/Time   CALCIUM 9.0 03/25/2015 1425   CALCIUM 8.8 04/22/2014 1417   ALKPHOS 45 03/25/2015 1425   ALKPHOS 47 04/22/2014 1417   AST 26 03/25/2015 1425   AST 18 04/22/2014 1417   ALT 25 03/25/2015 1425   ALT 22 04/22/2014 1417   BILITOT 0.72 03/25/2015 1425   BILITOT 0.8 04/22/2014 1417      RADIOGRAPHIC STUDIES: No results found.  ASSESSMENT AND PLAN: This is [name]  very pleasant 74 years old white male with history of multiple myeloma status post induction chemotherapy with Velcade, Revlimid and dexamethasone   followed by treatment with Carfilzomib, cyclophosphamide and dexamethasone with significant improvement in his disease followed by autologous peripheral blood stem cell transplant at [facility] on 03/20/2013. This was followed by 2 cycles of consolidation chemotherapy with Carfilzomib, Cytoxan and dexamethasone, and he tolerated it fairly well. He completed maintenance Revlimid 10 mg by mouth daily for 3 months and tolerating it fairly well. He started maintenance Revlimid with 15 mg by mouth daily in May 2015 and tolerating it well except for the persistent neutropenia.  He started the reduced dose of Revlimid 10 mg by mouth daily on 02/19/2015. He is tolerating this treatment much better. His myeloma panel today is still pending. I recommended for him to continue his current treatment with Revlimid 10 mg by mouth daily. For the new chest pain after the fall, I will order [name] chest x-ray today to rule out any fractures. The patient was also given prescription for Percocet 5/325 mg by mouth every 6 hours as needed for pain. He will continue treatment with Zometa every 2 months.  For the pulmonary embolism, he will continue on Xarelto 20 mg by mouth daily as scheduled.  For insomnia he was giving [name] refill of Restoril. He would come back for follow-up visit in one month for reevaluation with repeat blood work. He was advised to call immediately if he has any concerning symptoms in the interval. The patient voices understanding of current disease status and treatment options and is in agreement with the current care plan.  All questions were answered. The patient knows to call the clinic with any problems, questions or concerns. We can certainly see the patient much sooner if necessary.  Disclaimer: This note was dictated with voice recognition software.  Similar sounding words can inadvertently be transcribed and may not be corrected upon review.      

## 2015-04-30 ENCOUNTER — Other Ambulatory Visit: Payer: Self-pay | Admitting: Medical Oncology

## 2015-04-30 ENCOUNTER — Other Ambulatory Visit: Payer: Self-pay

## 2015-04-30 DIAGNOSIS — K219 Gastro-esophageal reflux disease without esophagitis: Secondary | ICD-10-CM

## 2015-04-30 LAB — BETA 2 MICROGLOBULIN, SERUM: Beta-2 Microglobulin: 2.56 mg/L — ABNORMAL HIGH (ref <=2.51)

## 2015-04-30 LAB — IGG, IGA, IGM
IGA: 159 mg/dL (ref 68–379)
IGG (IMMUNOGLOBIN G), SERUM: 1150 mg/dL (ref 650–1600)
IgM, Serum: 33 mg/dL — ABNORMAL LOW (ref 41–251)

## 2015-04-30 LAB — KAPPA/LAMBDA LIGHT CHAINS
Kappa free light chain: 4.24 mg/dL — ABNORMAL HIGH (ref 0.33–1.94)
Kappa:Lambda Ratio: 1.53 (ref 0.26–1.65)
Lambda Free Lght Chn: 2.78 mg/dL — ABNORMAL HIGH (ref 0.57–2.63)

## 2015-04-30 MED ORDER — PANTOPRAZOLE SODIUM 40 MG PO TBEC: 40.0000 mg | DELAYED_RELEASE_TABLET | Freq: Two times a day (BID) | ORAL | Status: DC

## 2015-04-30 MED ORDER — DICYCLOMINE HCL 10 MG PO CAPS: 10.0000 mg | ORAL_CAPSULE | Freq: Three times a day (TID) | ORAL | Status: DC

## 2015-05-01 ENCOUNTER — Ambulatory Visit (HOSPITAL_COMMUNITY)
Admission: RE | Admit: 2015-05-01 | Discharge: 2015-05-01 | Disposition: A | Discharge status: Home / Self Care | Payer: Medicare Other | Source: Ambulatory Visit | Attending: Gastroenterology | Admitting: Gastroenterology

## 2015-05-01 DIAGNOSIS — C9000 Multiple myeloma not having achieved remission: Secondary | ICD-10-CM | POA: Insufficient documentation

## 2015-05-01 DIAGNOSIS — Z86010 Personal history of colonic polyps: Secondary | ICD-10-CM | POA: Diagnosis not present

## 2015-05-01 DIAGNOSIS — K5790 Diverticulosis of intestine, part unspecified, without perforation or abscess without bleeding: Secondary | ICD-10-CM | POA: Insufficient documentation

## 2015-05-01 DIAGNOSIS — K5730 Diverticulosis of large intestine without perforation or abscess without bleeding: Secondary | ICD-10-CM | POA: Diagnosis not present

## 2015-05-01 DIAGNOSIS — Z1211 Encounter for screening for malignant neoplasm of colon: Secondary | ICD-10-CM | POA: Diagnosis not present

## 2015-05-06 ENCOUNTER — Other Ambulatory Visit: Payer: Self-pay | Admitting: *Deleted

## 2015-05-06 DIAGNOSIS — C9000 Multiple myeloma not having achieved remission: Secondary | ICD-10-CM

## 2015-05-06 MED ORDER — LENALIDOMIDE 10 MG PO CAPS: 10.0000 mg | ORAL_CAPSULE | Freq: Every day | ORAL | Status: DC

## 2015-05-07 NOTE — Telephone Encounter (Signed)
Revlimid rx refill faxed to [facility]  05/06/15

## 2015-05-12 DIAGNOSIS — R351 Nocturia: Secondary | ICD-10-CM | POA: Diagnosis not present

## 2015-05-12 DIAGNOSIS — N138 Other obstructive and reflux uropathy: Secondary | ICD-10-CM | POA: Diagnosis not present

## 2015-05-12 DIAGNOSIS — N5201 Erectile dysfunction due to arterial insufficiency: Secondary | ICD-10-CM | POA: Diagnosis not present

## 2015-05-12 DIAGNOSIS — N401 Enlarged prostate with lower urinary tract symptoms: Secondary | ICD-10-CM | POA: Diagnosis not present

## 2015-06-02 ENCOUNTER — Telehealth: Payer: Self-pay | Admitting: Internal Medicine

## 2015-06-02 ENCOUNTER — Ambulatory Visit: Payer: Medicare Other

## 2015-06-02 ENCOUNTER — Encounter: Payer: Self-pay | Admitting: Internal Medicine

## 2015-06-02 ENCOUNTER — Other Ambulatory Visit (HOSPITAL_BASED_OUTPATIENT_CLINIC_OR_DEPARTMENT_OTHER): Payer: Medicare Other

## 2015-06-02 ENCOUNTER — Ambulatory Visit (HOSPITAL_BASED_OUTPATIENT_CLINIC_OR_DEPARTMENT_OTHER): Payer: Medicare Other | Admitting: Internal Medicine

## 2015-06-02 VITALS — BP 109/55 | HR 62 | Temp 97.9°F | Resp 18 | Ht 69.5 in | Wt 213.2 lb

## 2015-06-02 DIAGNOSIS — Z86711 Personal history of pulmonary embolism: Secondary | ICD-10-CM

## 2015-06-02 DIAGNOSIS — Z9484 Stem cells transplant status: Secondary | ICD-10-CM | POA: Diagnosis not present

## 2015-06-02 DIAGNOSIS — C9000 Multiple myeloma not having achieved remission: Secondary | ICD-10-CM

## 2015-06-02 DIAGNOSIS — G4700 Insomnia, unspecified: Secondary | ICD-10-CM | POA: Diagnosis not present

## 2015-06-02 LAB — COMPREHENSIVE METABOLIC PANEL (CC13)
ALT: 17 U/L (ref 0–55)
ANION GAP: 6 meq/L (ref 3–11)
AST: 19 U/L (ref 5–34)
Albumin: 3.5 g/dL (ref 3.5–5.0)
Alkaline Phosphatase: 52 U/L (ref 40–150)
BUN: 8.5 mg/dL (ref 7.0–26.0)
CALCIUM: 9.2 mg/dL (ref 8.4–10.4)
CHLORIDE: 106 meq/L (ref 98–109)
CO2: 26 mEq/L (ref 22–29)
Creatinine: 1.1 mg/dL (ref 0.7–1.3)
EGFR: 67 mL/min/{1.73_m2} — AB (ref >90)
Glucose: 95 mg/dl (ref 70–140)
POTASSIUM: 3.8 meq/L (ref 3.5–5.1)
Sodium: 138 mEq/L (ref 136–145)
Total Bilirubin: 0.87 mg/dL (ref 0.20–1.20)
Total Protein: 6.4 g/dL (ref 6.4–8.3)

## 2015-06-02 LAB — CBC WITH DIFFERENTIAL/PLATELET
BASO%: 7.5 % — ABNORMAL HIGH (ref 0.0–2.0)
Basophils Absolute: 0.2 10*3/uL — ABNORMAL HIGH (ref 0.0–0.1)
EOS%: 10.1 % — AB (ref 0.0–7.0)
Eosinophils Absolute: 0.3 10*3/uL (ref 0.0–0.5)
HEMATOCRIT: 41 % (ref 38.4–49.9)
HGB: 13.6 g/dL (ref 13.0–17.1)
LYMPH#: 0.8 10*3/uL — AB (ref 0.9–3.3)
LYMPH%: 23.4 % (ref 14.0–49.0)
MCH: 31.2 pg (ref 27.2–33.4)
MCHC: 33.2 g/dL (ref 32.0–36.0)
MCV: 93.9 fL (ref 79.3–98.0)
MONO#: 0.3 10*3/uL (ref 0.1–0.9)
MONO%: 9.7 % (ref 0.0–14.0)
NEUT#: 1.6 10*3/uL (ref 1.5–6.5)
NEUT%: 49.3 % (ref 39.0–75.0)
PLATELETS: 152 10*3/uL (ref 140–400)
RBC: 4.37 10*6/uL (ref 4.20–5.82)
RDW: 15.3 % — ABNORMAL HIGH (ref 11.0–14.6)
WBC: 3.2 10*3/uL — ABNORMAL LOW (ref 4.0–10.3)

## 2015-06-02 LAB — LACTATE DEHYDROGENASE (CC13): LDH: 132 U/L (ref 125–245)

## 2015-06-02 NOTE — Progress Notes (Signed)
[facility] [phone]   Fax:([phone]) [place], [place] [address]  DIAGNOSIS: Stage IIA multiple myeloma diagnosed in July of 2013.   PRIOR THERAPY:  1) Systemic chemotherapy with subcutaneous Velcade 1.3 mg/M2 on days 1, 4, 8 and 11 every 3 weeks in addition to Revlimid 25 mg by mouth daily for 14 days every 3 weeks and dexamethasone 40 mg on a weekly basis. Status post 4 planned cycles.  2) Systemic chemotherapy with subcutaneous Velcade 1.3 mg/M2 on days 1, 4, 8 and 11 every 3 weeks in addition to Revlimid 25 mg by mouth daily for 14 days every 3 weeks and dexamethasone 40 mg on a weekly basis. 4 more cycles are planned and he is status post 4 cycles of the second set of 4 cycles, now status post a total of 10 cycles. 3) Systemic chemotherapy with Carfilzomib 36 mg/M2 on days 1, 2, 8, 9, 15 and 16 in addition to cyclophosphamide 300 mg/M2 IV on days 1, 8 and 15 as well as dexamethasone on days 1, 8, 15 and 22 every 4 weeks. Status post 4 cycles. 4) Zometa 4 mg IV every month. 5) autologous peripheral blood stem cell transplant on 03/20/2013 at [facility]. 6) Consolidation chemotherapy with Carfilzomib 36 mg/M2 on days 1, 2, 8, 9, 15 and 16 every 4 weeks in addition to cyclophosphamide 300 mg/M2 on days 1, 8 and 15 as well as dexamethasone 40 mg on a weekly basis every 4 weeks. Status post 2 cycles. First cycle 06/11/2013. 7) Maintenance therapy with Revlimid 10 mg by mouth daily status post 3 months of treatment. 8) palliative radiotherapy to the lytic lesion in the skull under the care of Dr. Sondra Come 9) Maintenance therapy with Revlimid 15 mg by mouth daily started 11/09/2013 and discontinued 02/18/2015 secondary to persistent neutropenia.   CURRENT THERAPY:  1) Maintenance therapy with Revlimid 10 mg by mouth daily started 02/19/2015. 2) Zometa 4 mg IV every 2 months. 3) Xarelto 20 mg by  mouth daily for pulmonary embolism.   INTERVAL HISTORY: [name] 74 y.o. male returns to the clinic today for routine monthly followup visit. He is tolerating his current treatment with Revlimid 10 mg by mouth daily much better. He denied having any significant fatigue or weakness. He denied having any fever or chills. He has no nausea or vomiting.  He denied having any significant chest pain, shortness of breath, cough or hemoptysis. He has repeat CBC on a complaints metabolic panel and LDH performed earlier today and he is here for evaluation and discussion of his lab results.  MEDICAL HISTORY: Past Medical History  Diagnosis Date  . GERD (gastroesophageal reflux disease)   . Adenomatous polyp of colon 1996  . Arthritis     Osteoarthritis right knee, spine, wrist  . Benign prostatic hypertrophy     (Dr. Karsten Ro in past)  . History of chronic prostatitis   . History of melanoma     Back (Dr. Wilhemina Bonito); early melanoma R arm 03/2011  . Diverticulosis   . Arthritis of hand, right     Right thumb (Dr. Daylene Katayama)  . Degenerative disc disease   . Hemorrhoids   . Erosive esophagitis   . Back ache     r/o Multiple Myeloma  . Multiple myeloma ([facility]) 2013    skull lytic lesions--treated with radiation 07/2014  . DVT (deep venous thrombosis) ([facility]) 01/2014  R calf  . Pulmonary embolism ([facility]) 01/2014  . Radiation 07/21/14-08/05/14    left occipital parietal skull 25 gray  . Hiatal hernia     ALLERGIES:  is allergic to penicillins.  MEDICATIONS:  Current Outpatient Prescriptions  Medication Sig Dispense Refill  . acetaminophen (TYLENOL) 500 MG tablet Take 1,000 mg by mouth every 8 (eight) hours as needed.    Marland Kitchen ascorbic acid (VITAMIN C) 500 MG tablet Take 500 mg by mouth daily.    . B Complex-C (B-COMPLEX WITH VITAMIN C) tablet Take 1 tablet by mouth daily.    . cholecalciferol (VITAMIN D) 1000 UNITS tablet Take 1,000 Units by mouth daily.    Marland Kitchen dicyclomine (BENTYL) 10 MG capsule Take  1 capsule (10 mg total) by mouth 3 (three) times daily before meals. 270 capsule 3  . fish oil-omega-3 fatty acids 1000 MG capsule Take 1 g by mouth daily.     . furosemide (LASIX) 20 MG tablet Take 1 tablet (20 mg total) by mouth daily. 14 tablet 1  . lenalidomide (REVLIMID) 10 MG capsule Take 1 capsule (10 mg total) by mouth daily. 28 capsule 0  . LORazepam (ATIVAN) 1 MG tablet Take 1 tablet (1 mg total) by mouth every 8 (eight) hours as needed for anxiety. 30 tablet 0  . methocarbamol (ROBAXIN) 500 MG tablet TAKE 1-2 TABLETS BY MOUTH EVERY 6 HOURS AS NEEDED FOR MUSCLE SPASMS 60 tablet 0  . Multiple Vitamin (MULTIVITAMIN WITH MINERALS) TABS Take 1 tablet by mouth daily.    . ondansetron (ZOFRAN) 4 MG tablet Take 1 tab every 6 hours as needed for nausea 40 tablet 1  . oxyCODONE-acetaminophen (PERCOCET/ROXICET) 5-325 MG tablet Take 1 tablet by mouth every 4 (four) hours as needed for severe pain. 30 tablet 0  . pantoprazole (PROTONIX) 40 MG tablet Take 1 tablet (40 mg total) by mouth 2 (two) times daily. 120 tablet 1  . Probiotic Product (ALIGN) 4 MG CAPS Take 1 capsule by mouth daily.    . prochlorperazine (COMPAZINE) 10 MG tablet Take 1 tablet (10 mg total) by mouth every 6 (six) hours as needed. 30 tablet 1  . rivaroxaban (XARELTO) 20 MG TABS tablet Take 1 tablet (20 mg total) by mouth daily with supper. 90 tablet 0  . tadalafil (CIALIS) 20 MG tablet Take 1 tablet (20 mg total) by mouth daily as needed for erectile dysfunction. 30 tablet 5  . temazepam (RESTORIL) 15 MG capsule Take 1 capsule (15 mg total) by mouth at bedtime as needed. 30 capsule 0  . traMADol (ULTRAM) 50 MG tablet Take 1-2 tablets (50-100 mg total) by mouth every 6 (six) hours as needed for severe pain. 30 tablet 1  . zolendronic acid (ZOMETA) 4 MG/5ML injection Inject 4 mg into the vein every 28 (twenty-eight) days.     No current facility-administered medications for this visit.   Facility-Administered Medications Ordered  in Other Visits  Medication Dose Route Frequency Provider Last Rate Last Dose  . ondansetron (ZOFRAN) tablet 8 mg  8 mg Oral Once Curt Bears, MD        SURGICAL HISTORY:  Past Surgical History  Procedure Laterality Date  . Total knee replaced  2000    Left knee  . Appendectomy    . Colonoscopy  2009    adenomatous polyp  . Esophagogastroduodenoscopy  2009    mild inflammation at esophagogastric junction  . Excision of melanoma      back ( x3)  . Great toe  surgery      bilateral  . Inguinal hernia repair      left, x 2  . Total knee arthroplasty  06/08/2011    Procedure: TOTAL KNEE ARTHROPLASTY;  Surgeon: Ninetta Lights, MD;  Location: [facility];  Service: Orthopedics;  Laterality: Right;  osteonics  . [place]  10/2012  . Bone marrow transplant  03/2013    Stem Cell    REVIEW OF SYSTEMS:  A comprehensive review of systems was negative.   PHYSICAL EXAMINATION: General appearance: alert, cooperative and no distress Head: Normocephalic, without obvious abnormality, atraumatic, Ecchymosis at the right frontal area of the face. Neck: no adenopathy, no JVD, supple, symmetrical, trachea midline and thyroid not enlarged, symmetric, no tenderness/mass/nodules Lymph nodes: Cervical, supraclavicular, and axillary nodes normal. Resp: clear to auscultation bilaterally Back: symmetric, no curvature. ROM normal. No CVA tenderness. Cardio: regular rate and rhythm, S1, S2 normal, no murmur, click, rub or gallop GI: soft, non-tender; bowel sounds normal; no masses,  no organomegaly Extremities: extremities normal, atraumatic, no cyanosis or edema Neurologic: Alert and oriented X 3, normal strength and tone. Normal symmetric reflexes. Normal coordination and gait  ECOG PERFORMANCE STATUS: 1 - Symptomatic but completely ambulatory  Blood pressure 109/55, pulse 62, temperature 97.9 F (36.6 C), temperature source Oral, resp. rate 18, height 5' 9.5" (1.765 m), weight 213 lb 3.2 oz (96.707  kg), SpO2 98 %.  LABORATORY DATA: Lab Results  Component Value Date   WBC 3.2* 06/02/2015   HGB 13.6 06/02/2015   HCT 41.0 06/02/2015   MCV 93.9 06/02/2015   PLT 152 06/02/2015      Chemistry      Component Value Date/Time   NA 138 06/02/2015 0848   NA 139 04/22/2014 1417   K 3.8 06/02/2015 0848   K 3.9 04/22/2014 1417   CL 107 04/22/2014 1417   CL 109* 12/31/2012 0759   CO2 26 06/02/2015 0848   CO2 22 04/22/2014 1417   BUN 8.5 06/02/2015 0848   BUN 10 04/22/2014 1417   CREATININE 1.1 06/02/2015 0848   CREATININE 1.1 04/22/2014 1417   CREATININE 0.97 11/18/2013 1045      Component Value Date/Time   CALCIUM 9.2 06/02/2015 0848   CALCIUM 8.8 04/22/2014 1417   ALKPHOS 52 06/02/2015 0848   ALKPHOS 47 04/22/2014 1417   AST 19 06/02/2015 0848   AST 18 04/22/2014 1417   ALT 17 06/02/2015 0848   ALT 22 04/22/2014 1417   BILITOT 0.87 06/02/2015 0848   BILITOT 0.8 04/22/2014 1417      RADIOGRAPHIC STUDIES: No results found.  ASSESSMENT AND PLAN: This is a very pleasant 74 years old white male with history of multiple myeloma status post induction chemotherapy with Velcade, Revlimid and dexamethasone followed by treatment with Carfilzomib, cyclophosphamide and dexamethasone with significant improvement in his disease followed by autologous peripheral blood stem cell transplant [facility] on 03/20/2013. This was followed by 2 cycles of consolidation chemotherapy with Carfilzomib, Cytoxan and dexamethasone, and he tolerated it fairly well. He completed maintenance Revlimid 10 mg by mouth daily for 3 months and tolerating it fairly well. He started maintenance Revlimid with 15 mg by mouth daily in May 2015 and tolerating it well except for the persistent neutropenia.  He started the reduced dose of Revlimid 10 mg by mouth daily on 02/19/2015. He is tolerating this treatment much better. His last myeloma panel showed stable disease. I recommended for him to continue his  current treatment with  Revlimid 10 mg by mouth daily. He will continue treatment with Zometa every 2 months.  For the pulmonary embolism, he will continue on Xarelto 20 mg by mouth daily as scheduled.  For insomnia he will continue on Restoril. He would come back for follow-up visit in one month for reevaluation with repeat blood work. He was advised to call immediately if he has any concerning symptoms in the interval. The patient voices understanding of current disease status and treatment options and is in agreement with the current care plan.  All questions were answered. The patient knows to call the clinic with any problems, questions or concerns. We can certainly see the patient much sooner if necessary.  Disclaimer: This note was dictated with voice recognition software. Similar sounding words can inadvertently be transcribed and may not be corrected upon review.

## 2015-06-02 NOTE — Telephone Encounter (Signed)
Gave and printed appts ched and avs for pt for DEC  °

## 2015-06-05 ENCOUNTER — Other Ambulatory Visit: Payer: Self-pay | Admitting: Medical Oncology

## 2015-06-05 DIAGNOSIS — C9000 Multiple myeloma not having achieved remission: Secondary | ICD-10-CM

## 2015-06-05 MED ORDER — LENALIDOMIDE 10 MG PO CAPS: 10.0000 mg | ORAL_CAPSULE | Freq: Every day | ORAL | Status: DC

## 2015-06-17 ENCOUNTER — Other Ambulatory Visit: Payer: Self-pay | Admitting: Internal Medicine

## 2015-06-18 ENCOUNTER — Other Ambulatory Visit: Payer: Self-pay | Admitting: Internal Medicine

## 2015-06-23 ENCOUNTER — Other Ambulatory Visit: Payer: Self-pay

## 2015-06-23 ENCOUNTER — Ambulatory Visit: Payer: Self-pay | Admitting: Internal Medicine

## 2015-06-25 ENCOUNTER — Other Ambulatory Visit: Payer: Self-pay | Admitting: *Deleted

## 2015-06-25 ENCOUNTER — Other Ambulatory Visit: Payer: Self-pay | Admitting: Family Medicine

## 2015-06-25 MED ORDER — DICYCLOMINE HCL 10 MG PO CAPS: 10.0000 mg | ORAL_CAPSULE | Freq: Three times a day (TID) | ORAL | Status: DC

## 2015-06-25 NOTE — Telephone Encounter (Signed)
Dr.[name] is okaying refill on Bentyl, patient states that he is having pain associated with IBS-will schedule appt if not better soon.

## 2015-06-25 NOTE — Telephone Encounter (Signed)
Is this okay?

## 2015-06-25 NOTE — Telephone Encounter (Signed)
This was written for shingles pain. Not sure what he is requesting this for

## 2015-06-30 ENCOUNTER — Telehealth: Payer: Self-pay

## 2015-06-30 DIAGNOSIS — C9000 Multiple myeloma not having achieved remission: Secondary | ICD-10-CM

## 2015-06-30 MED ORDER — LENALIDOMIDE 10 MG PO CAPS: 10.0000 mg | ORAL_CAPSULE | Freq: Every day | ORAL | Status: DC

## 2015-06-30 NOTE — Telephone Encounter (Signed)
Ok to refill 

## 2015-06-30 NOTE — Telephone Encounter (Signed)
Rx for Revlimid escribed to [facility], Authorization # [id number]

## 2015-06-30 NOTE — Addendum Note (Signed)
Addended by: Lucile Crater on: 06/30/2015 04:00 PM   Modules accepted: Orders

## 2015-06-30 NOTE — Telephone Encounter (Signed)
Pt called for revlimid refill to be sent to [facility], he will run out early next week. Concerned with holiday schedules.

## 2015-07-01 ENCOUNTER — Other Ambulatory Visit (HOSPITAL_BASED_OUTPATIENT_CLINIC_OR_DEPARTMENT_OTHER): Payer: Medicare Other

## 2015-07-01 ENCOUNTER — Ambulatory Visit (HOSPITAL_BASED_OUTPATIENT_CLINIC_OR_DEPARTMENT_OTHER): Payer: Medicare Other

## 2015-07-01 ENCOUNTER — Ambulatory Visit (HOSPITAL_BASED_OUTPATIENT_CLINIC_OR_DEPARTMENT_OTHER): Payer: Medicare Other | Admitting: Internal Medicine

## 2015-07-01 ENCOUNTER — Telehealth: Payer: Self-pay | Admitting: Internal Medicine

## 2015-07-01 ENCOUNTER — Encounter: Payer: Self-pay | Admitting: Internal Medicine

## 2015-07-01 VITALS — BP 114/67 | HR 72 | Temp 97.8°F | Resp 18 | Ht 69.5 in | Wt 214.1 lb

## 2015-07-01 DIAGNOSIS — C9000 Multiple myeloma not having achieved remission: Secondary | ICD-10-CM

## 2015-07-01 DIAGNOSIS — G4700 Insomnia, unspecified: Secondary | ICD-10-CM

## 2015-07-01 DIAGNOSIS — Z452 Encounter for adjustment and management of vascular access device: Secondary | ICD-10-CM | POA: Diagnosis not present

## 2015-07-01 DIAGNOSIS — Z86711 Personal history of pulmonary embolism: Secondary | ICD-10-CM

## 2015-07-01 LAB — COMPREHENSIVE METABOLIC PANEL
ALBUMIN: 3.6 g/dL (ref 3.5–5.0)
ALK PHOS: 54 U/L (ref 40–150)
ALT: 24 U/L (ref 0–55)
ANION GAP: 8 meq/L (ref 3–11)
AST: 23 U/L (ref 5–34)
BILIRUBIN TOTAL: 0.87 mg/dL (ref 0.20–1.20)
BUN: 10.5 mg/dL (ref 7.0–26.0)
CALCIUM: 9.6 mg/dL (ref 8.4–10.4)
CO2: 25 mEq/L (ref 22–29)
CREATININE: 1.1 mg/dL (ref 0.7–1.3)
Chloride: 109 mEq/L (ref 98–109)
EGFR: 66 mL/min/{1.73_m2} — ABNORMAL LOW (ref >90)
Glucose: 103 mg/dl (ref 70–140)
Potassium: 3.8 mEq/L (ref 3.5–5.1)
Sodium: 142 mEq/L (ref 136–145)
TOTAL PROTEIN: 6.8 g/dL (ref 6.4–8.3)

## 2015-07-01 LAB — CBC WITH DIFFERENTIAL/PLATELET
BASO%: 7.4 % — ABNORMAL HIGH (ref 0.0–2.0)
BASOS ABS: 0.2 10*3/uL — AB (ref 0.0–0.1)
EOS ABS: 0.3 10*3/uL (ref 0.0–0.5)
EOS%: 10.2 % — ABNORMAL HIGH (ref 0.0–7.0)
HEMATOCRIT: 42.2 % (ref 38.4–49.9)
HEMOGLOBIN: 14.4 g/dL (ref 13.0–17.1)
LYMPH%: 21.5 % (ref 14.0–49.0)
MCH: 31.4 pg (ref 27.2–33.4)
MCHC: 34.1 g/dL (ref 32.0–36.0)
MCV: 91.9 fL (ref 79.3–98.0)
MONO#: 0.3 10*3/uL (ref 0.1–0.9)
MONO%: 8.9 % (ref 0.0–14.0)
NEUT#: 1.7 10*3/uL (ref 1.5–6.5)
NEUT%: 52 % (ref 39.0–75.0)
PLATELETS: 162 10*3/uL (ref 140–400)
RBC: 4.59 10*6/uL (ref 4.20–5.82)
RDW: 14.7 % — ABNORMAL HIGH (ref 11.0–14.6)
WBC: 3.3 10*3/uL — ABNORMAL LOW (ref 4.0–10.3)
lymph#: 0.7 10*3/uL — ABNORMAL LOW (ref 0.9–3.3)
nRBC: 0 % (ref 0–0)

## 2015-07-01 LAB — LACTATE DEHYDROGENASE: LDH: 140 U/L (ref 125–245)

## 2015-07-01 MED ORDER — HEPARIN SOD (PORK) LOCK FLUSH 100 UNIT/ML IV SOLN
500.0000 [IU] | Freq: Once | INTRAVENOUS | Status: AC
  Administered 2015-07-01: 500 [IU] via INTRAVENOUS
  Filled 2015-07-01: qty 5

## 2015-07-01 MED ORDER — ZOLEDRONIC ACID 4 MG/100ML IV SOLN
4.0000 mg | Freq: Once | INTRAVENOUS | Status: AC
  Administered 2015-07-01: 4 mg via INTRAVENOUS
  Filled 2015-07-01: qty 100

## 2015-07-01 MED ORDER — SODIUM CHLORIDE 0.9 % IJ SOLN
10.0000 mL | INTRAMUSCULAR | Status: DC | PRN
  Administered 2015-07-01: 10 mL via INTRAVENOUS
  Filled 2015-07-01: qty 10

## 2015-07-01 MED ORDER — ALTEPLASE 2 MG IJ SOLR
2.0000 mg | Freq: Once | INTRAMUSCULAR | Status: AC | PRN
  Administered 2015-07-01: 2 mg
  Filled 2015-07-01: qty 2

## 2015-07-01 NOTE — Patient Instructions (Signed)

## 2015-07-01 NOTE — Telephone Encounter (Signed)
Gave pt appts for 1/9, 1/16, and 09/30/15.

## 2015-07-01 NOTE — Progress Notes (Signed)
Clarified orders with Diane RN (Dr. Worthy Flank nurse). Pt is to receive Zometa only at this time.

## 2015-07-01 NOTE — Telephone Encounter (Signed)
I am testing how to use quicknote in epic

## 2015-07-01 NOTE — Progress Notes (Signed)
[place] [phone]   Fax:(336) [place], MD [place] [address]  DIAGNOSIS: Stage IIA multiple myeloma diagnosed in July of 2013.   PRIOR THERAPY:  1) Systemic chemotherapy with subcutaneous Velcade 1.3 mg/M2 on days 1, 4, 8 and 11 every 3 weeks in addition to Revlimid 25 mg by mouth daily for 14 days every 3 weeks and dexamethasone 40 mg on a weekly basis. Status post 4 planned cycles.  2) Systemic chemotherapy with subcutaneous Velcade 1.3 mg/M2 on days 1, 4, 8 and 11 every 3 weeks in addition to Revlimid 25 mg by mouth daily for 14 days every 3 weeks and dexamethasone 40 mg on a weekly basis. 4 more cycles are planned and he is status post 4 cycles of the second set of 4 cycles, now status post a total of 10 cycles. 3) Systemic chemotherapy with Carfilzomib 36 mg/M2 on days 1, 2, 8, 9, 15 and 16 in addition to cyclophosphamide 300 mg/M2 IV on days 1, 8 and 15 as well as dexamethasone on days 1, 8, 15 and 22 every 4 weeks. Status post 4 cycles. 4) Zometa 4 mg IV every month. 5) autologous peripheral blood stem cell transplant on 03/20/2013 at [facility]. 6) Consolidation chemotherapy with Carfilzomib 36 mg/M2 on days 1, 2, 8, 9, 15 and 16 every 4 weeks in addition to cyclophosphamide 300 mg/M2 on days 1, 8 and 15 as well as dexamethasone 40 mg on a weekly basis every 4 weeks. Status post 2 cycles. First cycle 06/11/2013. 7) Maintenance therapy with Revlimid 10 mg by mouth daily status post 3 months of treatment. 8) palliative radiotherapy to the lytic lesion in the skull under the care of Dr. Sondra Come 9) Maintenance therapy with Revlimid 15 mg by mouth daily started 11/09/2013 and discontinued 02/18/2015 secondary to persistent neutropenia.   CURRENT THERAPY:  1) Maintenance therapy with Revlimid 10 mg by mouth daily started 02/19/2015. 2) Zometa 4 mg IV every 3 months. 3) Xarelto 20 mg by  mouth daily for pulmonary embolism.   INTERVAL HISTORY: [name] 74 y.o. male returns to the clinic today for routine monthly followup visit. He is tolerating his current treatment with Revlimid 10 mg by mouth daily much better. He denied having any significant fatigue or weakness. He denied having any fever or chills. He has no nausea or vomiting.  He denied having any significant chest pain, shortness of breath, cough or hemoptysis. He has repeat CBC on a complaints metabolic panel and LDH performed earlier today and he is here for evaluation and discussion of his lab results. He is also here for his Zometa infusion.  MEDICAL HISTORY: Past Medical History  Diagnosis Date  . GERD (gastroesophageal reflux disease)   . Adenomatous polyp of colon 1996  . Arthritis     Osteoarthritis right knee, spine, wrist  . Benign prostatic hypertrophy     (Dr. Karsten Ro in past)  . History of chronic prostatitis   . History of melanoma     Back (Dr. Wilhemina Bonito); early melanoma R arm 03/2011  . Diverticulosis   . Arthritis of hand, right     Right thumb (Dr. Daylene Katayama)  . Degenerative disc disease   . Hemorrhoids   . Erosive esophagitis   . Back ache     r/o Multiple Myeloma  . Multiple myeloma ([place]) 2013    skull lytic lesions--treated with radiation 07/2014  .  DVT (deep venous thrombosis) ([place]) 01/2014    R calf  . Pulmonary embolism ([place]) 01/2014  . Radiation 07/21/14-08/05/14    left occipital parietal skull 25 gray  . Hiatal hernia     ALLERGIES:  is allergic to penicillins.  MEDICATIONS:  Current Outpatient Prescriptions  Medication Sig Dispense Refill  . acetaminophen (TYLENOL) 500 MG tablet Take 1,000 mg by mouth every 8 (eight) hours as needed.    Marland Kitchen ascorbic acid (VITAMIN C) 500 MG tablet Take 500 mg by mouth daily.    . B Complex-C (B-COMPLEX WITH VITAMIN C) tablet Take 1 tablet by mouth daily.    . cholecalciferol (VITAMIN D) 1000 UNITS tablet Take 1,000 Units by mouth daily.    Marland Kitchen  dicyclomine (BENTYL) 10 MG capsule Take 1 capsule (10 mg total) by mouth 3 (three) times daily before meals. 60 capsule 0  . fish oil-omega-3 fatty acids 1000 MG capsule Take 1 g by mouth daily.     Marland Kitchen lenalidomide (REVLIMID) 10 MG capsule Take 1 capsule (10 mg total) by mouth daily. 28 capsule 0  . methocarbamol (ROBAXIN) 500 MG tablet TAKE 1-2 TABLETS BY MOUTH EVERY 6 HOURS AS NEEDED FOR MUSCLE SPASMS 60 tablet 0  . Multiple Vitamin (MULTIVITAMIN WITH MINERALS) TABS Take 1 tablet by mouth daily.    . ondansetron (ZOFRAN) 4 MG tablet Take 1 tab every 6 hours as needed for nausea 40 tablet 1  . oxyCODONE-acetaminophen (PERCOCET/ROXICET) 5-325 MG tablet Take 1 tablet by mouth every 4 (four) hours as needed for severe pain. 30 tablet 0  . pantoprazole (PROTONIX) 40 MG tablet Take 1 tablet by mouth two  times daily 180 tablet 0  . Probiotic Product (ALIGN) 4 MG CAPS Take 1 capsule by mouth daily.    . prochlorperazine (COMPAZINE) 10 MG tablet Take 1 tablet (10 mg total) by mouth every 6 (six) hours as needed. 30 tablet 1  . temazepam (RESTORIL) 15 MG capsule Take 1 capsule (15 mg total) by mouth at bedtime as needed. 30 capsule 0  . traMADol (ULTRAM) 50 MG tablet Take 1-2 tablets (50-100 mg total) by mouth every 6 (six) hours as needed for severe pain. 30 tablet 1  . XARELTO 20 MG TABS tablet Take 1 tablet by mouth  daily 90 tablet 0  . zolendronic acid (ZOMETA) 4 MG/5ML injection Inject 4 mg into the vein every 28 (twenty-eight) days.    . furosemide (LASIX) 20 MG tablet Take 1 tablet (20 mg total) by mouth daily. (Patient not taking: Reported on 07/01/2015) 14 tablet 1  . LORazepam (ATIVAN) 1 MG tablet Take 1 tablet (1 mg total) by mouth every 8 (eight) hours as needed for anxiety. (Patient not taking: Reported on 07/01/2015) 30 tablet 0  . tadalafil (CIALIS) 20 MG tablet Take 1 tablet (20 mg total) by mouth daily as needed for erectile dysfunction. 30 tablet 5   No current facility-administered  medications for this visit.   Facility-Administered Medications Ordered in Other Visits  Medication Dose Route Frequency Provider Last Rate Last Dose  . ondansetron (ZOFRAN) tablet 8 mg  8 mg Oral Once Curt Bears, MD        SURGICAL HISTORY:  Past Surgical History  Procedure Laterality Date  . Total knee replaced  2000    Left knee  . Appendectomy    . Colonoscopy  2009    adenomatous polyp  . Esophagogastroduodenoscopy  2009    mild inflammation at esophagogastric junction  . Excision of  melanoma      back ( x3)  . Great toe surgery      bilateral  . Inguinal hernia repair      left, x 2  . Total knee arthroplasty  06/08/2011    Procedure: TOTAL KNEE ARTHROPLASTY;  Surgeon: Loreta Ave, MD;  Location: [facility] OR;  Service: Orthopedics;  Laterality: Right;  osteonics  . [place]  10/2012  . Bone marrow transplant  03/2013    Stem Cell    REVIEW OF SYSTEMS:  A comprehensive review of systems was negative.   PHYSICAL EXAMINATION: General appearance: alert, cooperative and no distress Head: Normocephalic, without obvious abnormality, atraumatic, Ecchymosis at the right frontal area of the face. Neck: no adenopathy, no JVD, supple, symmetrical, trachea midline and thyroid not enlarged, symmetric, no tenderness/mass/nodules Lymph nodes: Cervical, supraclavicular, and axillary nodes normal. Resp: clear to auscultation bilaterally Back: symmetric, no curvature. ROM normal. No CVA tenderness. Cardio: regular rate and rhythm, S1, S2 normal, no murmur, click, rub or gallop GI: soft, non-tender; bowel sounds normal; no masses,  no organomegaly Extremities: extremities normal, atraumatic, no cyanosis or edema Neurologic: Alert and oriented X 3, normal strength and tone. Normal symmetric reflexes. Normal coordination and gait  ECOG PERFORMANCE STATUS: 1 - Symptomatic but completely ambulatory  Blood pressure 114/67, pulse 72, temperature 97.8 F (36.6 C), temperature source  Oral, resp. rate 18, height 5' 9.5" (1.765 m), weight 214 lb 1.6 oz (97.115 kg), SpO2 98 %.  LABORATORY DATA: Lab Results  Component Value Date   WBC 3.3* 07/01/2015   HGB 14.4 07/01/2015   HCT 42.2 07/01/2015   MCV 91.9 07/01/2015   PLT 162 07/01/2015      Chemistry      Component Value Date/Time   NA 142 07/01/2015 1125   NA 139 04/22/2014 1417   K 3.8 07/01/2015 1125   K 3.9 04/22/2014 1417   CL 107 04/22/2014 1417   CL 109* 12/31/2012 0759   CO2 25 07/01/2015 1125   CO2 22 04/22/2014 1417   BUN 10.5 07/01/2015 1125   BUN 10 04/22/2014 1417   CREATININE 1.1 07/01/2015 1125   CREATININE 1.1 04/22/2014 1417   CREATININE 0.97 11/18/2013 1045      Component Value Date/Time   CALCIUM 9.6 07/01/2015 1125   CALCIUM 8.8 04/22/2014 1417   ALKPHOS 54 07/01/2015 1125   ALKPHOS 47 04/22/2014 1417   AST 23 07/01/2015 1125   AST 18 04/22/2014 1417   ALT 24 07/01/2015 1125   ALT 22 04/22/2014 1417   BILITOT 0.87 07/01/2015 1125   BILITOT 0.8 04/22/2014 1417      RADIOGRAPHIC STUDIES: No results found.  ASSESSMENT AND PLAN: This is a very pleasant 74 years old white male with history of multiple myeloma status post induction chemotherapy with Velcade, Revlimid and dexamethasone followed by treatment with Carfilzomib, cyclophosphamide and dexamethasone with significant improvement in his disease followed by autologous peripheral blood stem cell transplant at [facility] on 03/20/2013. This was followed by 2 cycles of consolidation chemotherapy with Carfilzomib, Cytoxan and dexamethasone, and he tolerated it fairly well. He completed maintenance Revlimid 10 mg by mouth daily for 3 months and tolerating it fairly well. He started maintenance Revlimid with 15 mg by mouth daily in May 2015 and tolerating it well except for the persistent neutropenia.  He started the reduced dose of Revlimid 10 mg by mouth daily on 02/19/2015. He is tolerating this treatment much better. I  recommended for him  to continue his current treatment with Revlimid 10 mg by mouth daily. He will continue treatment with Zometa every 3 months.  For the pulmonary embolism, he will continue on Xarelto 20 mg by mouth daily as scheduled.  For insomnia he will continue on Restoril. He would come back for follow-up visit in one month for reevaluation with repeat myeloma panel for reevaluation of his disease. He was advised to call immediately if he has any concerning symptoms in the interval. The patient voices understanding of current disease status and treatment options and is in agreement with the current care plan.  All questions were answered. The patient knows to call the clinic with any problems, questions or concerns. We can certainly see the patient much sooner if necessary.  Disclaimer: This note was dictated with voice recognition software. Similar sounding words can inadvertently be transcribed and may not be corrected upon review.

## 2015-07-01 NOTE — Progress Notes (Signed)
1410: Blood return noted from port after TPA dwell. Order sent to schedulers for port flush in 6 weeks. Pt stated he will check MyChart for appt.

## 2015-07-06 DIAGNOSIS — J209 Acute bronchitis, unspecified: Secondary | ICD-10-CM | POA: Diagnosis not present

## 2015-07-20 ENCOUNTER — Other Ambulatory Visit (HOSPITAL_BASED_OUTPATIENT_CLINIC_OR_DEPARTMENT_OTHER): Payer: Medicare Other

## 2015-07-20 DIAGNOSIS — C9000 Multiple myeloma not having achieved remission: Secondary | ICD-10-CM

## 2015-07-20 DIAGNOSIS — C9001 Multiple myeloma in remission: Secondary | ICD-10-CM | POA: Diagnosis not present

## 2015-07-20 LAB — COMPREHENSIVE METABOLIC PANEL
ALT: 27 U/L (ref 0–55)
ANION GAP: 7 meq/L (ref 3–11)
AST: 22 U/L (ref 5–34)
Albumin: 3.4 g/dL — ABNORMAL LOW (ref 3.5–5.0)
Alkaline Phosphatase: 48 U/L (ref 40–150)
BILIRUBIN TOTAL: 0.48 mg/dL (ref 0.20–1.20)
BUN: 11.1 mg/dL (ref 7.0–26.0)
CALCIUM: 8.9 mg/dL (ref 8.4–10.4)
CO2: 27 mEq/L (ref 22–29)
CREATININE: 0.9 mg/dL (ref 0.7–1.3)
Chloride: 107 mEq/L (ref 98–109)
EGFR: 79 mL/min/{1.73_m2} — AB (ref >90)
Glucose: 91 mg/dl (ref 70–140)
Potassium: 3.5 mEq/L (ref 3.5–5.1)
Sodium: 141 mEq/L (ref 136–145)
TOTAL PROTEIN: 6.5 g/dL (ref 6.4–8.3)

## 2015-07-20 LAB — CBC WITH DIFFERENTIAL/PLATELET
BASO%: 3.7 % — ABNORMAL HIGH (ref 0.0–2.0)
BASOS ABS: 0.1 10*3/uL (ref 0.0–0.1)
EOS ABS: 0.2 10*3/uL (ref 0.0–0.5)
EOS%: 9.4 % — ABNORMAL HIGH (ref 0.0–7.0)
HEMATOCRIT: 40.3 % (ref 38.4–49.9)
HGB: 13.6 g/dL (ref 13.0–17.1)
LYMPH%: 26.1 % (ref 14.0–49.0)
MCH: 31.2 pg (ref 27.2–33.4)
MCHC: 33.7 g/dL (ref 32.0–36.0)
MCV: 92.4 fL (ref 79.3–98.0)
MONO#: 0.2 10*3/uL (ref 0.1–0.9)
MONO%: 7.8 % (ref 0.0–14.0)
NEUT%: 53 % (ref 39.0–75.0)
NEUTROS ABS: 1.3 10*3/uL — AB (ref 1.5–6.5)
NRBC: 0 % (ref 0–0)
PLATELETS: 164 10*3/uL (ref 140–400)
RBC: 4.36 10*6/uL (ref 4.20–5.82)
RDW: 14.4 % (ref 11.0–14.6)
WBC: 2.5 10*3/uL — AB (ref 4.0–10.3)
lymph#: 0.6 10*3/uL — ABNORMAL LOW (ref 0.9–3.3)

## 2015-07-20 LAB — LACTATE DEHYDROGENASE: LDH: 126 U/L (ref 125–245)

## 2015-07-22 LAB — KAPPA/LAMBDA LIGHT CHAINS
Ig Kappa Free Light Chain: 46.72 mg/L — ABNORMAL HIGH (ref 3.30–19.40)
Ig Lambda Free Light Chain: 31.87 mg/L — ABNORMAL HIGH (ref 5.71–26.30)
Kappa/Lambda FluidC Ratio: 1.47 (ref 0.26–1.65)

## 2015-07-22 LAB — MULTIPLE MYELOMA PANEL, SERUM
ALBUMIN/GLOB SERPL: 1.3 (ref 0.7–1.7)
ALPHA 1: 0.2 g/dL (ref 0.0–0.4)
ALPHA2 GLOB SERPL ELPH-MCNC: 0.6 g/dL (ref 0.4–1.0)
Albumin SerPl Elph-Mcnc: 3.4 g/dL (ref 2.9–4.4)
B-Globulin SerPl Elph-Mcnc: 0.9 g/dL (ref 0.7–1.3)
GAMMA GLOB SERPL ELPH-MCNC: 1 g/dL (ref 0.4–1.8)
GLOBULIN, TOTAL: 2.7 g/dL (ref 2.2–3.9)
IGA/IMMUNOGLOBULIN A, SERUM: 158 mg/dL (ref 61–437)
IGM (IMMUNOGLOBIN M), SRM: 22 mg/dL (ref 15–143)
Total Protein: 6.1 g/dL (ref 6.0–8.5)

## 2015-07-22 LAB — BETA 2 MICROGLOBULIN, SERUM: BETA 2: 2.5 mg/L — AB (ref 0.6–2.4)

## 2015-07-27 ENCOUNTER — Encounter: Payer: Self-pay | Admitting: Internal Medicine

## 2015-07-27 ENCOUNTER — Telehealth: Payer: Self-pay | Admitting: Internal Medicine

## 2015-07-27 ENCOUNTER — Ambulatory Visit (HOSPITAL_BASED_OUTPATIENT_CLINIC_OR_DEPARTMENT_OTHER): Payer: Medicare Other | Admitting: Internal Medicine

## 2015-07-27 ENCOUNTER — Other Ambulatory Visit: Payer: Self-pay | Admitting: Medical Oncology

## 2015-07-27 VITALS — BP 117/77 | HR 79 | Temp 98.8°F | Resp 18 | Ht 69.5 in | Wt 215.9 lb

## 2015-07-27 DIAGNOSIS — C9000 Multiple myeloma not having achieved remission: Secondary | ICD-10-CM

## 2015-07-27 DIAGNOSIS — Z86711 Personal history of pulmonary embolism: Secondary | ICD-10-CM | POA: Diagnosis not present

## 2015-07-27 DIAGNOSIS — G4700 Insomnia, unspecified: Secondary | ICD-10-CM | POA: Diagnosis not present

## 2015-07-27 MED ORDER — ONDANSETRON HCL 8 MG PO TABS
8.0000 mg | ORAL_TABLET | Freq: Once | ORAL | Status: DC
Start: 2015-07-27 — End: 2015-07-27

## 2015-07-27 MED ORDER — OXYCODONE-ACETAMINOPHEN 5-325 MG PO TABS: 1.0000 | ORAL_TABLET | ORAL | Status: DC | PRN

## 2015-07-27 MED ORDER — LENALIDOMIDE 10 MG PO CAPS: 10.0000 mg | ORAL_CAPSULE | Freq: Every day | ORAL | Status: DC

## 2015-07-27 MED ORDER — RIVAROXABAN 20 MG PO TABS: 20.0000 mg | ORAL_TABLET | Freq: Every day | ORAL | Status: DC

## 2015-07-27 MED ORDER — TEMAZEPAM 15 MG PO CAPS: 15.0000 mg | ORAL_CAPSULE | Freq: Every evening | ORAL | Status: DC | PRN

## 2015-07-27 MED FILL — XARELTO 20 MG TABLET: 20 | 90 days supply | Qty: 90 | Fill #0

## 2015-07-27 MED FILL — TEMAZEPAM 15 MG CAPSULE: 15 | 30 days supply | Qty: 30 | Fill #0

## 2015-07-27 MED FILL — OXYCODONE/APAP 5/325MG: 5-325 | 10 days supply | Qty: 60 | Fill #0

## 2015-07-27 NOTE — Progress Notes (Signed)
[place] Telephone:(336) [phone]   Fax:(336) [place], MD [place] [address]  DIAGNOSIS: Stage IIA multiple myeloma diagnosed in July of 2013.   PRIOR THERAPY:  1) Systemic chemotherapy with subcutaneous Velcade 1.3 mg/M2 on days 1, 4, 8 and 11 every 3 weeks in addition to Revlimid 25 mg by mouth daily for 14 days every 3 weeks and dexamethasone 40 mg on a weekly basis. Status post 4 planned cycles.  2) Systemic chemotherapy with subcutaneous Velcade 1.3 mg/M2 on days 1, 4, 8 and 11 every 3 weeks in addition to Revlimid 25 mg by mouth daily for 14 days every 3 weeks and dexamethasone 40 mg on a weekly basis. 4 more cycles are planned and he is status post 4 cycles of the second set of 4 cycles, now status post a total of 10 cycles. 3) Systemic chemotherapy with Carfilzomib 36 mg/M2 on days 1, 2, 8, 9, 15 and 16 in addition to cyclophosphamide 300 mg/M2 IV on days 1, 8 and 15 as well as dexamethasone on days 1, 8, 15 and 22 every 4 weeks. Status post 4 cycles. 4) Zometa 4 mg IV every month. 5) autologous peripheral blood stem cell transplant on 03/20/2013 at [facility]. 6) Consolidation chemotherapy with Carfilzomib 36 mg/M2 on days 1, 2, 8, 9, 15 and 16 every 4 weeks in addition to cyclophosphamide 300 mg/M2 on days 1, 8 and 15 as well as dexamethasone 40 mg on a weekly basis every 4 weeks. Status post 2 cycles. First cycle 06/11/2013. 7) Maintenance therapy with Revlimid 10 mg by mouth daily status post 3 months of treatment. 8) palliative radiotherapy to the lytic lesion in the skull under the care of Dr. Sondra Come 9) Maintenance therapy with Revlimid 15 mg by mouth daily started 11/09/2013 and discontinued 02/18/2015 secondary to persistent neutropenia.   CURRENT THERAPY:  1) Maintenance therapy with Revlimid 10 mg by mouth daily started 02/19/2015. 2) Zometa 4 mg IV every 3 months. 3) Xarelto 20 mg by  mouth daily for pulmonary embolism.   INTERVAL HISTORY: [name] 75 y.o. male returns to the clinic today for routine monthly followup visit. He is tolerating his current treatment with Revlimid 10 mg by mouth daily much better. The patient mentions that he had diarrhea for several days and he took Imodium which resulted in constipation. He self treated with laxatives and has one episode of diarrhea with some bloody stool but this is completely resolved. He is feeling much better today. He denied having any significant fatigue or weakness. He denied having any fever or chills. He has no nausea or vomiting.  He denied having any significant chest pain, shortness of breath, cough or hemoptysis. He has occasional pain in the skull at the same area where he received treatment for lytic lesion but it's not persistent. He has repeat myeloma panel performed recently and he is here for evaluation and discussion of his lab results.  MEDICAL HISTORY: Past Medical History  Diagnosis Date  . GERD (gastroesophageal reflux disease)   . Adenomatous polyp of colon 1996  . Arthritis     Osteoarthritis right knee, spine, wrist  . Benign prostatic hypertrophy     (Dr. Karsten Ro in past)  . History of chronic prostatitis   . History of melanoma     Back (Dr. Wilhemina Bonito); early melanoma R arm 03/2011  . Diverticulosis   . Arthritis of  hand, right     Right thumb (Dr. Daylene Katayama)  . Degenerative disc disease   . Hemorrhoids   . Erosive esophagitis   . Back ache     r/o Multiple Myeloma  . Multiple myeloma ([place]) 2013    skull lytic lesions--treated with radiation 07/2014  . DVT (deep venous thrombosis) ([place]) 01/2014    R calf  . Pulmonary embolism ([place]) 01/2014  . Radiation 07/21/14-08/05/14    left occipital parietal skull 25 gray  . Hiatal hernia     ALLERGIES:  is allergic to penicillins.  MEDICATIONS:  Current Outpatient Prescriptions  Medication Sig Dispense Refill  . acetaminophen (TYLENOL) 500 MG  tablet Take 1,000 mg by mouth every 8 (eight) hours as needed.    Marland Kitchen ascorbic acid (VITAMIN C) 500 MG tablet Take 500 mg by mouth daily.    . B Complex-C (B-COMPLEX WITH VITAMIN C) tablet Take 1 tablet by mouth daily.    . cholecalciferol (VITAMIN D) 1000 UNITS tablet Take 1,000 Units by mouth daily.    Marland Kitchen dicyclomine (BENTYL) 10 MG capsule Take 1 capsule (10 mg total) by mouth 3 (three) times daily before meals. 60 capsule 0  . fish oil-omega-3 fatty acids 1000 MG capsule Take 1 g by mouth daily.     . furosemide (LASIX) 20 MG tablet Take 1 tablet (20 mg total) by mouth daily. (Patient not taking: Reported on 07/01/2015) 14 tablet 1  . lenalidomide (REVLIMID) 10 MG capsule Take 1 capsule (10 mg total) by mouth daily. 28 capsule 0  . LORazepam (ATIVAN) 1 MG tablet Take 1 tablet (1 mg total) by mouth every 8 (eight) hours as needed for anxiety. (Patient not taking: Reported on 07/01/2015) 30 tablet 0  . methocarbamol (ROBAXIN) 500 MG tablet TAKE 1-2 TABLETS BY MOUTH EVERY 6 HOURS AS NEEDED FOR MUSCLE SPASMS 60 tablet 0  . Multiple Vitamin (MULTIVITAMIN WITH MINERALS) TABS Take 1 tablet by mouth daily.    . ondansetron (ZOFRAN) 4 MG tablet Take 1 tab every 6 hours as needed for nausea 40 tablet 1  . oxyCODONE-acetaminophen (PERCOCET/ROXICET) 5-325 MG tablet Take 1 tablet by mouth every 4 (four) hours as needed for severe pain. 30 tablet 0  . pantoprazole (PROTONIX) 40 MG tablet Take 1 tablet by mouth two  times daily 180 tablet 0  . Probiotic Product (ALIGN) 4 MG CAPS Take 1 capsule by mouth daily.    . prochlorperazine (COMPAZINE) 10 MG tablet Take 1 tablet (10 mg total) by mouth every 6 (six) hours as needed. 30 tablet 1  . tadalafil (CIALIS) 20 MG tablet Take 1 tablet (20 mg total) by mouth daily as needed for erectile dysfunction. 30 tablet 5  . temazepam (RESTORIL) 15 MG capsule Take 1 capsule (15 mg total) by mouth at bedtime as needed. 30 capsule 0  . traMADol (ULTRAM) 50 MG tablet Take 1-2  tablets (50-100 mg total) by mouth every 6 (six) hours as needed for severe pain. 30 tablet 1  . XARELTO 20 MG TABS tablet Take 1 tablet by mouth  daily 90 tablet 0  . zolendronic acid (ZOMETA) 4 MG/5ML injection Inject 4 mg into the vein every 28 (twenty-eight) days.     No current facility-administered medications for this visit.   Facility-Administered Medications Ordered in Other Visits  Medication Dose Route Frequency Provider Last Rate Last Dose  . ondansetron (ZOFRAN) tablet 8 mg  8 mg Oral Once Curt Bears, MD        SURGICAL  HISTORY:  Past Surgical History  Procedure Laterality Date  . Total knee replaced  2000    Left knee  . Appendectomy    . Colonoscopy  2009    adenomatous polyp  . Esophagogastroduodenoscopy  2009    mild inflammation at esophagogastric junction  . Excision of melanoma      back ( x3)  . Great toe surgery      bilateral  . Inguinal hernia repair      left, x 2  . Total knee arthroplasty  06/08/2011    Procedure: TOTAL KNEE ARTHROPLASTY;  Surgeon: Loreta Ave, MD;  Location: [facility] OR;  Service: Orthopedics;  Laterality: Right;  osteonics  . [place]  10/2012  . Bone marrow transplant  03/2013    Stem Cell    REVIEW OF SYSTEMS:  Constitutional: negative Eyes: negative Ears, nose, mouth, throat, and face: negative Respiratory: negative Cardiovascular: negative Gastrointestinal: positive for constipation Genitourinary:negative Integument/breast: negative Hematologic/lymphatic: negative Musculoskeletal:positive for bone pain Neurological: negative Behavioral/Psych: negative Endocrine: negative Allergic/Immunologic: negative   PHYSICAL EXAMINATION: General appearance: alert, cooperative and no distress Head: Normocephalic, without obvious abnormality, atraumatic, Ecchymosis at the right frontal area of the face. Neck: no adenopathy, no JVD, supple, symmetrical, trachea midline and thyroid not enlarged, symmetric, no  tenderness/mass/nodules Lymph nodes: Cervical, supraclavicular, and axillary nodes normal. Resp: clear to auscultation bilaterally Back: symmetric, no curvature. ROM normal. No CVA tenderness. Cardio: regular rate and rhythm, S1, S2 normal, no murmur, click, rub or gallop GI: soft, non-tender; bowel sounds normal; no masses,  no organomegaly Extremities: extremities normal, atraumatic, no cyanosis or edema Neurologic: Alert and oriented X 3, normal strength and tone. Normal symmetric reflexes. Normal coordination and gait  ECOG PERFORMANCE STATUS: 1 - Symptomatic but completely ambulatory  Blood pressure 117/77, pulse 79, temperature 98.8 F (37.1 C), temperature source Oral, resp. rate 18, height 5' 9.5" (1.765 m), weight 215 lb 14.4 oz (97.932 kg), SpO2 97 %.  LABORATORY DATA: Lab Results  Component Value Date   WBC 2.5* 07/20/2015   HGB 13.6 07/20/2015   HCT 40.3 07/20/2015   MCV 92.4 07/20/2015   PLT 164 07/20/2015      Chemistry      Component Value Date/Time   NA 141 07/20/2015 0901   NA 139 04/22/2014 1417   K 3.5 07/20/2015 0901   K 3.9 04/22/2014 1417   CL 107 04/22/2014 1417   CL 109* 12/31/2012 0759   CO2 27 07/20/2015 0901   CO2 22 04/22/2014 1417   BUN 11.1 07/20/2015 0901   BUN 10 04/22/2014 1417   CREATININE 0.9 07/20/2015 0901   CREATININE 1.1 04/22/2014 1417   CREATININE 0.97 11/18/2013 1045      Component Value Date/Time   CALCIUM 8.9 07/20/2015 0901   CALCIUM 8.8 04/22/2014 1417   ALKPHOS 48 07/20/2015 0901   ALKPHOS 47 04/22/2014 1417   AST 22 07/20/2015 0901   AST 18 04/22/2014 1417   ALT 27 07/20/2015 0901   ALT 22 04/22/2014 1417   BILITOT 0.48 07/20/2015 0901   BILITOT 0.8 04/22/2014 1417      RADIOGRAPHIC STUDIES: No results found.  ASSESSMENT AND PLAN: This is a very pleasant 75 years old white male with history of multiple myeloma status post induction chemotherapy with Velcade, Revlimid and dexamethasone followed by treatment with  Carfilzomib, cyclophosphamide and dexamethasone with significant improvement in his disease followed by autologous peripheral blood stem cell transplant at [facility] on 03/20/2013. This was followed  by 2 cycles of consolidation chemotherapy with Carfilzomib, Cytoxan and dexamethasone, and he tolerated it fairly well. He completed maintenance Revlimid 10 mg by mouth daily for 3 months and tolerating it fairly well. He started maintenance Revlimid with 15 mg by mouth daily in May 2015 and tolerating it well except for the persistent neutropenia.  He started the reduced dose of Revlimid 10 mg by mouth daily on 02/19/2015. He is tolerating this treatment much better. He continues to have mild neutropenia. The recent myeloma panel showed no evidence for disease progression. I discussed the lab result with the patient today. I recommended for him to continue his current treatment with Revlimid 10 mg by mouth daily. He will continue treatment with Zometa every 3 months.  For the pulmonary embolism, he will continue on Xarelto 20 mg by mouth daily as scheduled.  For insomnia he will continue on Restoril. I gave the patient a refill for Xarelto, Zofran as well as Percocet. He would come back for follow-up visit in 6 weeks for reevaluation with repeat myeloma panel for reevaluation of his disease. He will receive his Zometa infusion at that time. He was advised to call immediately if he has any concerning symptoms in the interval. The patient voices understanding of current disease status and treatment options and is in agreement with the current care plan.  All questions were answered. The patient knows to call the clinic with any problems, questions or concerns. We can certainly see the patient much sooner if necessary.  Disclaimer: This note was dictated with voice recognition software. Similar sounding words can inadvertently be transcribed and may not be corrected upon review.

## 2015-07-27 NOTE — Progress Notes (Signed)
revlimid faxed to [facility]

## 2015-07-27 NOTE — Telephone Encounter (Signed)
per pof to sch pt appt-gave pt copy of avs-sent MW email to sch trmt

## 2015-08-04 DIAGNOSIS — D0421 Carcinoma in situ of skin of right ear and external auricular canal: Secondary | ICD-10-CM | POA: Diagnosis not present

## 2015-08-04 DIAGNOSIS — D225 Melanocytic nevi of trunk: Secondary | ICD-10-CM | POA: Diagnosis not present

## 2015-08-04 DIAGNOSIS — Z85828 Personal history of other malignant neoplasm of skin: Secondary | ICD-10-CM | POA: Diagnosis not present

## 2015-08-04 DIAGNOSIS — Z85820 Personal history of malignant melanoma of skin: Secondary | ICD-10-CM | POA: Diagnosis not present

## 2015-08-04 DIAGNOSIS — D1801 Hemangioma of skin and subcutaneous tissue: Secondary | ICD-10-CM | POA: Diagnosis not present

## 2015-08-04 DIAGNOSIS — D0439 Carcinoma in situ of skin of other parts of face: Secondary | ICD-10-CM | POA: Diagnosis not present

## 2015-08-04 DIAGNOSIS — D485 Neoplasm of uncertain behavior of skin: Secondary | ICD-10-CM | POA: Diagnosis not present

## 2015-08-04 DIAGNOSIS — C4492 Squamous cell carcinoma of skin, unspecified: Secondary | ICD-10-CM

## 2015-08-04 DIAGNOSIS — L570 Actinic keratosis: Secondary | ICD-10-CM | POA: Diagnosis not present

## 2015-08-04 HISTORY — DX: Squamous cell carcinoma of skin, unspecified: C44.92

## 2015-08-12 ENCOUNTER — Encounter: Payer: Self-pay | Admitting: *Deleted

## 2015-08-26 ENCOUNTER — Other Ambulatory Visit: Payer: Self-pay | Admitting: Medical Oncology

## 2015-08-26 DIAGNOSIS — C9000 Multiple myeloma not having achieved remission: Secondary | ICD-10-CM

## 2015-08-26 MED ORDER — LENALIDOMIDE 10 MG PO CAPS: 10.0000 mg | ORAL_CAPSULE | Freq: Every day | ORAL | Status: DC

## 2015-08-26 NOTE — Progress Notes (Signed)
Faxed revlimid to [facility]

## 2015-09-02 ENCOUNTER — Telehealth: Payer: Self-pay

## 2015-09-02 DIAGNOSIS — R600 Localized edema: Secondary | ICD-10-CM

## 2015-09-02 MED ORDER — FUROSEMIDE 20 MG PO TABS: 20.0000 mg | ORAL_TABLET | Freq: Every day | ORAL | Status: DC

## 2015-09-02 NOTE — Telephone Encounter (Signed)
Swelling in legs and feet up to knees, legs were swelling since last Wed. while he was in [place]. He flew back on Saturday. He has been using a diuretic that is not helping. He is taking xarelto. Does have neuropathy. Legs not black and blue, not ugly red. Skin feels like on fire when laying down.  No fever. Has diarrhea d/t revlimid but there is no change in that. No N/V. Pt is questioning stopping revlimid until appt 09/08/15. [phone].

## 2015-09-02 NOTE — Addendum Note (Signed)
Addended by: Ardeen Garland on: 09/02/2015 04:25 PM   Modules accepted: Orders, Medications

## 2015-09-02 NOTE — Telephone Encounter (Signed)
I called pt . He is taking an otc Diurex. Per [name] lasix called to pharmacy in [facility] and pt told to hold revlimid until next ov.

## 2015-09-08 ENCOUNTER — Other Ambulatory Visit (HOSPITAL_BASED_OUTPATIENT_CLINIC_OR_DEPARTMENT_OTHER): Payer: Medicare Other

## 2015-09-08 ENCOUNTER — Ambulatory Visit: Payer: Medicare Other

## 2015-09-08 ENCOUNTER — Ambulatory Visit (HOSPITAL_BASED_OUTPATIENT_CLINIC_OR_DEPARTMENT_OTHER): Payer: Medicare Other

## 2015-09-08 ENCOUNTER — Ambulatory Visit (HOSPITAL_BASED_OUTPATIENT_CLINIC_OR_DEPARTMENT_OTHER): Payer: Medicare Other | Admitting: Internal Medicine

## 2015-09-08 ENCOUNTER — Telehealth: Payer: Self-pay | Admitting: Internal Medicine

## 2015-09-08 ENCOUNTER — Encounter: Payer: Self-pay | Admitting: Internal Medicine

## 2015-09-08 VITALS — BP 122/69 | HR 62 | Temp 97.6°F | Resp 18 | Ht 69.5 in | Wt 218.1 lb

## 2015-09-08 DIAGNOSIS — Z7901 Long term (current) use of anticoagulants: Secondary | ICD-10-CM

## 2015-09-08 DIAGNOSIS — C9000 Multiple myeloma not having achieved remission: Secondary | ICD-10-CM

## 2015-09-08 DIAGNOSIS — Z961 Presence of intraocular lens: Secondary | ICD-10-CM | POA: Diagnosis not present

## 2015-09-08 DIAGNOSIS — Z95828 Presence of other vascular implants and grafts: Secondary | ICD-10-CM

## 2015-09-08 DIAGNOSIS — G620 Drug-induced polyneuropathy: Secondary | ICD-10-CM | POA: Diagnosis not present

## 2015-09-08 DIAGNOSIS — R609 Edema, unspecified: Secondary | ICD-10-CM | POA: Diagnosis not present

## 2015-09-08 DIAGNOSIS — G4700 Insomnia, unspecified: Secondary | ICD-10-CM

## 2015-09-08 DIAGNOSIS — G893 Neoplasm related pain (acute) (chronic): Secondary | ICD-10-CM

## 2015-09-08 DIAGNOSIS — Z86711 Personal history of pulmonary embolism: Secondary | ICD-10-CM

## 2015-09-08 DIAGNOSIS — I82441 Acute embolism and thrombosis of right tibial vein: Secondary | ICD-10-CM

## 2015-09-08 DIAGNOSIS — R600 Localized edema: Secondary | ICD-10-CM

## 2015-09-08 LAB — COMPREHENSIVE METABOLIC PANEL
ALK PHOS: 55 U/L (ref 40–150)
ALT: 29 U/L (ref 0–55)
AST: 24 U/L (ref 5–34)
Albumin: 3.4 g/dL — ABNORMAL LOW (ref 3.5–5.0)
Anion Gap: 8 mEq/L (ref 3–11)
BUN: 18.2 mg/dL (ref 7.0–26.0)
CHLORIDE: 105 meq/L (ref 98–109)
CO2: 25 meq/L (ref 22–29)
Calcium: 9.6 mg/dL (ref 8.4–10.4)
Creatinine: 1.1 mg/dL (ref 0.7–1.3)
EGFR: 65 mL/min/{1.73_m2} — AB (ref >90)
GLUCOSE: 101 mg/dL (ref 70–140)
POTASSIUM: 3.9 meq/L (ref 3.5–5.1)
SODIUM: 138 meq/L (ref 136–145)
Total Bilirubin: 0.74 mg/dL (ref 0.20–1.20)
Total Protein: 6.6 g/dL (ref 6.4–8.3)

## 2015-09-08 LAB — CBC WITH DIFFERENTIAL/PLATELET
BASO%: 0.3 % (ref 0.0–2.0)
BASOS ABS: 0 10*3/uL (ref 0.0–0.1)
EOS ABS: 0.2 10*3/uL (ref 0.0–0.5)
EOS%: 4.8 % (ref 0.0–7.0)
HCT: 41.2 % (ref 38.4–49.9)
HGB: 13.7 g/dL (ref 13.0–17.1)
LYMPH%: 22.4 % (ref 14.0–49.0)
MCH: 31.1 pg (ref 27.2–33.4)
MCHC: 33.2 g/dL (ref 32.0–36.0)
MCV: 93.7 fL (ref 79.3–98.0)
MONO#: 0.4 10*3/uL (ref 0.1–0.9)
MONO%: 10.8 % (ref 0.0–14.0)
NEUT#: 2.3 10*3/uL (ref 1.5–6.5)
NEUT%: 61.7 % (ref 39.0–75.0)
Platelets: 163 10*3/uL (ref 140–400)
RBC: 4.39 10*6/uL (ref 4.20–5.82)
RDW: 16.2 % — ABNORMAL HIGH (ref 11.0–14.6)
WBC: 3.7 10*3/uL — AB (ref 4.0–10.3)
lymph#: 0.8 10*3/uL — ABNORMAL LOW (ref 0.9–3.3)

## 2015-09-08 LAB — LACTATE DEHYDROGENASE: LDH: 146 U/L (ref 125–245)

## 2015-09-08 MED ORDER — SODIUM CHLORIDE 0.9% FLUSH
10.0000 mL | INTRAVENOUS | Status: DC | PRN
  Administered 2015-09-08: 10 mL via INTRAVENOUS
  Filled 2015-09-08: qty 10

## 2015-09-08 MED ORDER — ZOLEDRONIC ACID 4 MG/100ML IV SOLN
4.0000 mg | Freq: Once | INTRAVENOUS | Status: AC
Start: 2015-09-08 — End: 2015-09-08
  Administered 2015-09-08: 4 mg via INTRAVENOUS
  Filled 2015-09-08: qty 100

## 2015-09-08 MED ORDER — FUROSEMIDE 20 MG PO TABS: 20.0000 mg | ORAL_TABLET | Freq: Every day | ORAL | Status: DC

## 2015-09-08 MED ORDER — HEPARIN SOD (PORK) LOCK FLUSH 100 UNIT/ML IV SOLN
500.0000 [IU] | Freq: Once | INTRAVENOUS | Status: AC
  Administered 2015-09-08: 500 [IU] via INTRAVENOUS
  Filled 2015-09-08: qty 5

## 2015-09-08 MED ORDER — METHYLPREDNISOLONE 4 MG PO TBPK: ORAL_TABLET | ORAL | Status: DC

## 2015-09-08 MED ORDER — SODIUM CHLORIDE 0.9 % IV SOLN
Freq: Once | INTRAVENOUS | Status: AC
  Administered 2015-09-08: 11:00:00 via INTRAVENOUS

## 2015-09-08 MED ORDER — GABAPENTIN 100 MG PO CAPS: 100.0000 mg | ORAL_CAPSULE | Freq: Three times a day (TID) | ORAL | Status: DC

## 2015-09-08 NOTE — Progress Notes (Signed)
[facility] [phone]   [phone] [place], MD [place] [address]  DIAGNOSIS: Stage IIA multiple myeloma diagnosed in July of 2013.   PRIOR THERAPY:  1) Systemic chemotherapy with subcutaneous Velcade 1.3 mg/M2 on days 1, 4, 8 and 11 every 3 weeks in addition to Revlimid 25 mg by mouth daily for 14 days every 3 weeks and dexamethasone 40 mg on a weekly basis. Status post 4 planned cycles.  2) Systemic chemotherapy with subcutaneous Velcade 1.3 mg/M2 on days 1, 4, 8 and 11 every 3 weeks in addition to Revlimid 25 mg by mouth daily for 14 days every 3 weeks and dexamethasone 40 mg on a weekly basis. 4 more cycles are planned and he is status post 4 cycles of the second set of 4 cycles, now status post a total of 10 cycles. 3) Systemic chemotherapy with Carfilzomib 36 mg/M2 on days 1, 2, 8, 9, 15 and 16 in addition to cyclophosphamide 300 mg/M2 IV on days 1, 8 and 15 as well as dexamethasone on days 1, 8, 15 and 22 every 4 weeks. Status post 4 cycles. 4) Zometa 4 mg IV every month. 5) autologous peripheral blood stem cell transplant on 03/20/2013 at [facility]. 6) Consolidation chemotherapy with Carfilzomib 36 mg/M2 on days 1, 2, 8, 9, 15 and 16 every 4 weeks in addition to cyclophosphamide 300 mg/M2 on days 1, 8 and 15 as well as dexamethasone 40 mg on a weekly basis every 4 weeks. Status post 2 cycles. First cycle 06/11/2013. 7) Maintenance therapy with Revlimid 10 mg by mouth daily status post 3 months of treatment. 8) palliative radiotherapy to the lytic lesion in the skull under the care of Dr. Sondra Come 9) Maintenance therapy with Revlimid 15 mg by mouth daily started 11/09/2013 and discontinued 02/18/2015 secondary to persistent neutropenia.   CURRENT THERAPY:  1) Maintenance therapy with Revlimid 10 mg by mouth daily started 02/19/2015. 2) Zometa 4 mg IV every 3 months. 3) Xarelto 20 mg by  mouth daily for pulmonary embolism.   INTERVAL HISTORY: [name] 75 y.o. male returns to the clinic today for routine monthly followup visit. The patient is feeling fine today with no specific complaints. He just came back from a cruise to the [place] and enjoyed his times a day. He had swelling of the lower extremities and was started on Lasix 20 mg by mouth daily with significant improvement in the swelling after 5 doses. He would like to keep some Lasix on hand as needed. He also continues to have peripheral neuropathy in the toes. He has been off treatment with Revlimid for the last 7 days. He denied having any significant fatigue or weakness. He denied having any fever or chills. He has no nausea or vomiting.  He denied having any significant chest pain, shortness of breath, cough or hemoptysis. He also continues to have occasional pain in the skull at the same area where he received treatment for lytic lesion. He had repeat CBC and comprehensive metabolic panel performed earlier today and he is here for evaluation and discussion of his lab results.  MEDICAL HISTORY: Past Medical History  Diagnosis Date  . GERD (gastroesophageal reflux disease)   . Adenomatous polyp of colon 1996  . Arthritis     Osteoarthritis right knee, spine, wrist  . Benign prostatic hypertrophy     (Dr. Karsten Ro in past)  . History of  chronic prostatitis   . History of melanoma     Back (Dr. Karlyn Agee); early melanoma R arm 03/2011  . Diverticulosis   . Arthritis of hand, right     Right thumb (Dr. Teressa Senter)  . Degenerative disc disease   . Hemorrhoids   . Erosive esophagitis   . Back ache     r/o Multiple Myeloma  . Multiple myeloma (HCC) 2013    skull lytic lesions--treated with radiation 07/2014  . DVT (deep venous thrombosis) (HCC) 01/2014    R calf  . Pulmonary embolism (HCC) 01/2014  . Radiation 07/21/14-08/05/14    left occipital parietal skull 25 gray  . Hiatal hernia   . SCC (squamous cell  carcinoma) 08/04/15    right ear;dysplastic nevus with severe atypia-chest    ALLERGIES:  is allergic to penicillins.  MEDICATIONS:  Current Outpatient Prescriptions  Medication Sig Dispense Refill  . acetaminophen (TYLENOL) 500 MG tablet Take 1,000 mg by mouth every 8 (eight) hours as needed.    Marland Kitchen ascorbic acid (VITAMIN C) 500 MG tablet Take 500 mg by mouth daily.    . B Complex-C (B-COMPLEX WITH VITAMIN C) tablet Take 1 tablet by mouth daily.    . bisacodyl (DULCOLAX) 5 MG EC tablet Take 5 mg by mouth daily as needed for moderate constipation.    . cholecalciferol (VITAMIN D) 1000 UNITS tablet Take 1,000 Units by mouth daily.    Marland Kitchen dicyclomine (BENTYL) 10 MG capsule Take 1 capsule (10 mg total) by mouth 3 (three) times daily before meals. 60 capsule 0  . fish oil-omega-3 fatty acids 1000 MG capsule Take 1 g by mouth daily.     . furosemide (LASIX) 20 MG tablet Take 1 tablet (20 mg total) by mouth daily. 7 tablet 0  . lenalidomide (REVLIMID) 10 MG capsule Take 1 capsule (10 mg total) by mouth daily. 28 capsule 0  . LORazepam (ATIVAN) 1 MG tablet Take 1 tablet (1 mg total) by mouth every 8 (eight) hours as needed for anxiety. (Patient not taking: Reported on 07/01/2015) 30 tablet 0  . methocarbamol (ROBAXIN) 500 MG tablet TAKE 1-2 TABLETS BY MOUTH EVERY 6 HOURS AS NEEDED FOR MUSCLE SPASMS 60 tablet 0  . Multiple Vitamin (MULTIVITAMIN WITH MINERALS) TABS Take 1 tablet by mouth daily.    Marland Kitchen oxyCODONE-acetaminophen (PERCOCET/ROXICET) 5-325 MG tablet Take 1 tablet by mouth every 4 (four) hours as needed for severe pain. 60 tablet 0  . pantoprazole (PROTONIX) 40 MG tablet Take 1 tablet by mouth two  times daily 180 tablet 0  . Probiotic Product (ALIGN) 4 MG CAPS Take 1 capsule by mouth daily.    . prochlorperazine (COMPAZINE) 10 MG tablet Take 1 tablet (10 mg total) by mouth every 6 (six) hours as needed. 30 tablet 1  . rivaroxaban (XARELTO) 20 MG TABS tablet Take 1 tablet (20 mg total) by mouth  daily with supper. 90 tablet 0  . tadalafil (CIALIS) 20 MG tablet Take 1 tablet (20 mg total) by mouth daily as needed for erectile dysfunction. 30 tablet 5  . temazepam (RESTORIL) 15 MG capsule Take 1 capsule (15 mg total) by mouth at bedtime as needed. 30 capsule 0  . traMADol (ULTRAM) 50 MG tablet Take 1-2 tablets (50-100 mg total) by mouth every 6 (six) hours as needed for severe pain. 30 tablet 1  . zolendronic acid (ZOMETA) 4 MG/5ML injection Inject 4 mg into the vein every 28 (twenty-eight) days.     No current facility-administered  medications for this visit.   Facility-Administered Medications Ordered in Other Visits  Medication Dose Route Frequency Provider Last Rate Last Dose  . ondansetron (ZOFRAN) tablet 8 mg  8 mg Oral Once Si Gaul, MD      . sodium chloride flush (NS) 0.9 % injection 10 mL  10 mL Intravenous PRN Si Gaul, MD   10 mL at 09/08/15 0845    SURGICAL HISTORY:  Past Surgical History  Procedure Laterality Date  . Total knee replaced  2000    Left knee  . Appendectomy    . Colonoscopy  2009    adenomatous polyp  . Esophagogastroduodenoscopy  2009    mild inflammation at esophagogastric junction  . Excision of melanoma      back ( x3)  . Great toe surgery      bilateral  . Inguinal hernia repair      left, x 2  . Total knee arthroplasty  06/08/2011    Procedure: TOTAL KNEE ARTHROPLASTY;  Surgeon: Loreta Ave, MD;  Location: [facility] OR;  Service: Orthopedics;  Laterality: Right;  osteonics  . [place]  10/2012  . Bone marrow transplant  03/2013    Stem Cell    REVIEW OF SYSTEMS:  Constitutional: negative Eyes: negative Ears, nose, mouth, throat, and face: negative Respiratory: negative Cardiovascular: negative Gastrointestinal: negative Genitourinary:negative Integument/breast: negative Hematologic/lymphatic: negative Musculoskeletal:positive for bone pain Neurological: positive for paresthesia Behavioral/Psych:  negative Endocrine: negative Allergic/Immunologic: negative   PHYSICAL EXAMINATION: General appearance: alert, cooperative and no distress Head: Normocephalic, without obvious abnormality, atraumatic, Ecchymosis at the right frontal area of the face. Neck: no adenopathy, no JVD, supple, symmetrical, trachea midline and thyroid not enlarged, symmetric, no tenderness/mass/nodules Lymph nodes: Cervical, supraclavicular, and axillary nodes normal. Resp: clear to auscultation bilaterally Back: symmetric, no curvature. ROM normal. No CVA tenderness. Cardio: regular rate and rhythm, S1, S2 normal, no murmur, click, rub or gallop GI: soft, non-tender; bowel sounds normal; no masses,  no organomegaly Extremities: extremities normal, atraumatic, no cyanosis or edema Neurologic: Alert and oriented X 3, normal strength and tone. Normal symmetric reflexes. Normal coordination and gait  ECOG PERFORMANCE STATUS: 1 - Symptomatic but completely ambulatory  There were no vitals taken for this visit.  LABORATORY DATA: Lab Results  Component Value Date   WBC 2.5* 07/20/2015   HGB 13.6 07/20/2015   HCT 40.3 07/20/2015   MCV 92.4 07/20/2015   PLT 164 07/20/2015      Chemistry      Component Value Date/Time   NA 141 07/20/2015 0901   NA 139 04/22/2014 1417   K 3.5 07/20/2015 0901   K 3.9 04/22/2014 1417   CL 107 04/22/2014 1417   CL 109* 12/31/2012 0759   CO2 27 07/20/2015 0901   CO2 22 04/22/2014 1417   BUN 11.1 07/20/2015 0901   BUN 10 04/22/2014 1417   CREATININE 0.9 07/20/2015 0901   CREATININE 1.1 04/22/2014 1417   CREATININE 0.97 11/18/2013 1045      Component Value Date/Time   CALCIUM 8.9 07/20/2015 0901   CALCIUM 8.8 04/22/2014 1417   ALKPHOS 48 07/20/2015 0901   ALKPHOS 47 04/22/2014 1417   AST 22 07/20/2015 0901   AST 18 04/22/2014 1417   ALT 27 07/20/2015 0901   ALT 22 04/22/2014 1417   BILITOT 0.48 07/20/2015 0901   BILITOT 0.8 04/22/2014 1417      RADIOGRAPHIC  STUDIES: No results found.  ASSESSMENT AND PLAN: This is a very pleasant 74 years  old white male with history of multiple myeloma status post induction chemotherapy with Velcade, Revlimid and dexamethasone followed by treatment with Carfilzomib, cyclophosphamide and dexamethasone with significant improvement in his disease followed by autologous peripheral blood stem cell transplant at [facility] on 03/20/2013. This was followed by 2 cycles of consolidation chemotherapy with Carfilzomib, Cytoxan and dexamethasone, and he tolerated it fairly well. He completed maintenance Revlimid 10 mg by mouth daily for 3 months and tolerating it fairly well. He started maintenance Revlimid with 15 mg by mouth daily in May 2015 and tolerating it well except for the persistent neutropenia.  He started the reduced dose of Revlimid 10 mg by mouth daily on 02/19/2015. He is tolerating this treatment much better.  I recommended for him to continue his current treatment with Revlimid 10 mg by mouth daily. He will continue treatment with Zometa every 3 months. He will receive his infusion today For the pulmonary embolism, he will continue on Xarelto 20 mg by mouth daily as scheduled.  For insomnia he will continue on Restoril. For the bone pain in the skull, I started the patient on Medrol Dosepak. For the peripheral neuropathy, I started the patient on Neurontin 100 mg by mouth 3 times a day. He would come back for follow-up visit in one month for reevaluation with repeat CBC and comprehensive metabolic panel.  He was advised to call immediately if he has any concerning symptoms in the interval. The patient voices understanding of current disease status and treatment options and is in agreement with the current care plan.  All questions were answered. The patient knows to call the clinic with any problems, questions or concerns. We can certainly see the patient much sooner if necessary.  Disclaimer: This note was  dictated with voice recognition software. Similar sounding words can inadvertently be transcribed and may not be corrected upon review.

## 2015-09-08 NOTE — Patient Instructions (Signed)

## 2015-09-08 NOTE — Patient Instructions (Signed)

## 2015-09-08 NOTE — Telephone Encounter (Signed)
per pof to sch pt appt-sent MW email to move zometa to coordinate w/MD

## 2015-09-09 ENCOUNTER — Telehealth: Payer: Self-pay | Admitting: *Deleted

## 2015-09-09 NOTE — Telephone Encounter (Signed)
I have adjusted 5/30 appt

## 2015-09-10 ENCOUNTER — Telehealth: Payer: Self-pay

## 2015-09-10 ENCOUNTER — Encounter (HOSPITAL_COMMUNITY): Payer: Self-pay | Admitting: Emergency Medicine

## 2015-09-10 ENCOUNTER — Emergency Department (HOSPITAL_COMMUNITY)
Admission: EM | Admit: 2015-09-10 | Discharge: 2015-09-10 | Disposition: A | Discharge status: Home / Self Care | Payer: Medicare Other | Attending: Emergency Medicine | Admitting: Emergency Medicine

## 2015-09-10 ENCOUNTER — Other Ambulatory Visit: Payer: Self-pay

## 2015-09-10 ENCOUNTER — Emergency Department (HOSPITAL_COMMUNITY): Payer: Medicare Other

## 2015-09-10 DIAGNOSIS — Z87891 Personal history of nicotine dependence: Secondary | ICD-10-CM | POA: Diagnosis not present

## 2015-09-10 DIAGNOSIS — Z880 Allergy status to penicillin: Secondary | ICD-10-CM | POA: Insufficient documentation

## 2015-09-10 DIAGNOSIS — Z86718 Personal history of other venous thrombosis and embolism: Secondary | ICD-10-CM | POA: Insufficient documentation

## 2015-09-10 DIAGNOSIS — Z86010 Personal history of colonic polyps: Secondary | ICD-10-CM | POA: Diagnosis not present

## 2015-09-10 DIAGNOSIS — Z85828 Personal history of other malignant neoplasm of skin: Secondary | ICD-10-CM | POA: Diagnosis not present

## 2015-09-10 DIAGNOSIS — R0789 Other chest pain: Secondary | ICD-10-CM | POA: Diagnosis not present

## 2015-09-10 DIAGNOSIS — K219 Gastro-esophageal reflux disease without esophagitis: Secondary | ICD-10-CM | POA: Insufficient documentation

## 2015-09-10 DIAGNOSIS — Z79899 Other long term (current) drug therapy: Secondary | ICD-10-CM | POA: Insufficient documentation

## 2015-09-10 DIAGNOSIS — R079 Chest pain, unspecified: Secondary | ICD-10-CM | POA: Insufficient documentation

## 2015-09-10 DIAGNOSIS — Z7901 Long term (current) use of anticoagulants: Secondary | ICD-10-CM | POA: Insufficient documentation

## 2015-09-10 DIAGNOSIS — N400 Enlarged prostate without lower urinary tract symptoms: Secondary | ICD-10-CM | POA: Diagnosis not present

## 2015-09-10 DIAGNOSIS — Z85820 Personal history of malignant melanoma of skin: Secondary | ICD-10-CM | POA: Insufficient documentation

## 2015-09-10 DIAGNOSIS — Z86711 Personal history of pulmonary embolism: Secondary | ICD-10-CM | POA: Diagnosis not present

## 2015-09-10 DIAGNOSIS — M158 Other polyosteoarthritis: Secondary | ICD-10-CM | POA: Insufficient documentation

## 2015-09-10 LAB — BASIC METABOLIC PANEL
Anion gap: 8 (ref 5–15)
BUN: 17 mg/dL (ref 6–20)
CHLORIDE: 108 mmol/L (ref 101–111)
CO2: 26 mmol/L (ref 22–32)
CREATININE: 0.93 mg/dL (ref 0.61–1.24)
Calcium: 9.2 mg/dL (ref 8.9–10.3)
GFR calc Af Amer: >60 mL/min (ref >60)
GFR calc non Af Amer: >60 mL/min (ref >60)
Glucose, Bld: 116 mg/dL — ABNORMAL HIGH (ref 65–99)
POTASSIUM: 3.8 mmol/L (ref 3.5–5.1)
Sodium: 142 mmol/L (ref 135–145)

## 2015-09-10 LAB — CBC
HEMATOCRIT: 39.6 % (ref 39.0–52.0)
Hemoglobin: 12.9 g/dL — ABNORMAL LOW (ref 13.0–17.0)
MCH: 31.1 pg (ref 26.0–34.0)
MCHC: 32.6 g/dL (ref 30.0–36.0)
MCV: 95.4 fL (ref 78.0–100.0)
PLATELETS: 171 10*3/uL (ref 150–400)
RBC: 4.15 MIL/uL — ABNORMAL LOW (ref 4.22–5.81)
RDW: 15.3 % (ref 11.5–15.5)
WBC: 5.3 10*3/uL (ref 4.0–10.5)

## 2015-09-10 LAB — I-STAT TROPONIN, ED: Troponin i, poc: 0 ng/mL (ref 0.00–0.08)

## 2015-09-10 MED ORDER — HEPARIN SOD (PORK) LOCK FLUSH 100 UNIT/ML IV SOLN
500.0000 [IU] | Freq: Once | INTRAVENOUS | Status: AC
  Administered 2015-09-10: 500 [IU]
  Filled 2015-09-10: qty 5

## 2015-09-10 NOTE — Discharge Instructions (Signed)
Nonspecific Chest Pain  °Chest pain can be caused by many different conditions. There is always a chance that your pain could be related to something serious, such as a heart attack or a blood clot in your lungs. Chest pain can also be caused by conditions that are not life-threatening. If you have chest pain, it is very important to follow up with your health care provider. °CAUSES  °Chest pain can be caused by: °· Heartburn. °· Pneumonia or bronchitis. °· Anxiety or stress. °· Inflammation around your heart (pericarditis) or lung (pleuritis or pleurisy). °· A blood clot in your lung. °· A collapsed lung (pneumothorax). It can develop suddenly on its own (spontaneous pneumothorax) or from trauma to the chest. °· Shingles infection (varicella-zoster virus). °· Heart attack. °· Damage to the bones, muscles, and cartilage that make up your chest wall. This can include: °¨ Bruised bones due to injury. °¨ Strained muscles or cartilage due to frequent or repeated coughing or overwork. °¨ Fracture to one or more ribs. °¨ Sore cartilage due to inflammation (costochondritis). °RISK FACTORS  °Risk factors for chest pain may include: °· Activities that increase your risk for trauma or injury to your chest. °· Respiratory infections or conditions that cause frequent coughing. °· Medical conditions or overeating that can cause heartburn. °· Heart disease or family history of heart disease. °· Conditions or health behaviors that increase your risk of developing a blood clot. °· Having had chicken pox (varicella zoster). °SIGNS AND SYMPTOMS °Chest pain can feel like: °· Burning or tingling on the surface of your chest or deep in your chest. °· Crushing, pressure, aching, or squeezing pain. °· Dull or sharp pain that is worse when you move, cough, or take a deep breath. °· Pain that is also felt in your back, neck, shoulder, or arm, or pain that spreads to any of these areas. °Your chest pain may come and go, or it may stay  constant. °DIAGNOSIS °Lab tests or other studies may be needed to find the cause of your pain. Your health care provider may have you take a test called an ambulatory ECG (electrocardiogram). An ECG records your heartbeat patterns at the time the test is performed. You may also have other tests, such as: °· Transthoracic echocardiogram (TTE). During echocardiography, sound waves are used to create a picture of all of the heart structures and to look at how blood flows through your heart. °· Transesophageal echocardiogram (TEE). This is a more advanced imaging test that obtains images from inside your body. It allows your health care provider to see your heart in finer detail. °· Cardiac monitoring. This allows your health care provider to monitor your heart rate and rhythm in real time. °· Holter monitor. This is a portable device that records your heartbeat and can help to diagnose abnormal heartbeats. It allows your health care provider to track your heart activity for several days, if needed. °· Stress tests. These can be done through exercise or by taking medicine that makes your heart beat more quickly. °· Blood tests. °· Imaging tests. °TREATMENT  °Your treatment depends on what is causing your chest pain. Treatment may include: °· Medicines. These may include: °¨ Acid blockers for heartburn. °¨ Anti-inflammatory medicine. °¨ Pain medicine for inflammatory conditions. °¨ Antibiotic medicine, if an infection is present. °¨ Medicines to dissolve blood clots. °¨ Medicines to treat coronary artery disease. °· Supportive care for conditions that do not require medicines. This may include: °¨ Resting. °¨ Applying heat   or cold packs to injured areas. °¨ Limiting activities until pain decreases. °HOME CARE INSTRUCTIONS °· If you were prescribed an antibiotic medicine, finish it all even if you start to feel better. °· Avoid any activities that bring on chest pain. °· Do not use any tobacco products, including  cigarettes, chewing tobacco, or electronic cigarettes. If you need help quitting, ask your health care provider. °· Do not drink alcohol. °· Take medicines only as directed by your health care provider. °· Keep all follow-up visits as directed by your health care provider. This is important. This includes any further testing if your chest pain does not go away. °· If heartburn is the cause for your chest pain, you may be told to keep your head raised (elevated) while sleeping. This reduces the chance that acid will go from your stomach into your esophagus. °· Make lifestyle changes as directed by your health care provider. These may include: °¨ Getting regular exercise. Ask your health care provider to suggest some activities that are safe for you. °¨ Eating a heart-healthy diet. A registered dietitian can help you to learn healthy eating options. °¨ Maintaining a healthy weight. °¨ Managing diabetes, if necessary. °¨ Reducing stress. °SEEK MEDICAL CARE IF: °· Your chest pain does not go away after treatment. °· You have a rash with blisters on your chest. °· You have a fever. °SEEK IMMEDIATE MEDICAL CARE IF:  °· Your chest pain is worse. °· You have an increasing cough, or you cough up blood. °· You have severe abdominal pain. °· You have severe weakness. °· You faint. °· You have chills. °· You have sudden, unexplained chest discomfort. °· You have sudden, unexplained discomfort in your arms, back, neck, or jaw. °· You have shortness of breath at any time. °· You suddenly start to sweat, or your skin gets clammy. °· You feel nauseous or you vomit. °· You suddenly feel light-headed or dizzy. °· Your heart begins to beat quickly, or it feels like it is skipping beats. °These symptoms may represent a serious problem that is an emergency. Do not wait to see if the symptoms will go away. Get medical help right away. Call your local emergency services (911 in the [place] Do not drive yourself to the hospital. °  °This  information is not intended to replace advice given to you by your health care provider. Make sure you discuss any questions you have with your health care provider. °  °Document Released: 04/06/2005 Document Revised: 07/18/2014 Document Reviewed: 01/31/2014 °Elsevier Interactive Patient Education ©2016 [employer] ° °

## 2015-09-10 NOTE — ED Notes (Signed)
Pt with hx multiple myeloma c/o SOB, chest tightness. Pt takes daily remezda chemo and had IV zoneda chemo on Tuesday.

## 2015-09-10 NOTE — ED Provider Notes (Signed)
CSN: [id number]     Arrival date & time 09/10/15  0910 History   First MD Initiated Contact with Patient 09/10/15 0920     Chief Complaint  Patient presents with  . Chest Pain     (Consider location/radiation/quality/duration/timing/severity/associated sxs/prior Treatment) HPI   75 year old male with chest pain. Patient describes chest tightness in the center to the right side of his chest which began while laying down in bed. Since been pretty constant since then. Does not radiate. No appreciable exacerbating relieving factors. Denies any associated symptoms such as dyspnea, diaphoresis, nausea or palpitations. He does have a past history of DVT, currently nonsteroidal. He denies any unusual leg pain or swelling. He also has a history multiple myeloma actively being treated. No known CAD. No fevers or chills. No cough.  Past Medical History  Diagnosis Date  . GERD (gastroesophageal reflux disease)   . Adenomatous polyp of colon 1996  . Arthritis     Osteoarthritis right knee, spine, wrist  . Benign prostatic hypertrophy     (Dr. Karsten Ro in past)  . History of chronic prostatitis   . History of melanoma     Back (Dr. Wilhemina Bonito); early melanoma R arm 03/2011  . Diverticulosis   . Arthritis of hand, right     Right thumb (Dr. Daylene Katayama)  . Degenerative disc disease   . Hemorrhoids   . Erosive esophagitis   . Back ache     r/o Multiple Myeloma  . Multiple myeloma ([place]) 2013    skull lytic lesions--treated with radiation 07/2014  . DVT (deep venous thrombosis) ([place]) 01/2014    R calf  . Pulmonary embolism ([place]) 01/2014  . Radiation 07/21/14-08/05/14    left occipital parietal skull 25 gray  . Hiatal hernia   . SCC (squamous cell carcinoma) 08/04/15    right ear;dysplastic nevus with severe atypia-chest   Past Surgical History  Procedure Laterality Date  . Total knee replaced  2000    Left knee  . Appendectomy    . Colonoscopy  2009    adenomatous polyp  .  Esophagogastroduodenoscopy  2009    mild inflammation at esophagogastric junction  . Excision of melanoma      back ( x3)  . Great toe surgery      bilateral  . Inguinal hernia repair      left, x 2  . Total knee arthroplasty  06/08/2011    Procedure: TOTAL KNEE ARTHROPLASTY;  Surgeon: Ninetta Lights, MD;  Location: [facility];  Service: Orthopedics;  Laterality: Right;  osteonics  . [place]  10/2012  . Bone marrow transplant  03/2013    Stem Cell   Family History  Problem Relation Age of Onset  . Dementia Mother   . Stroke Father     brainstem stroke  . Hypertension Father   . Diabetes Father   . Prostate cancer Maternal Grandfather   . Anesthesia problems Neg Hx   . Depression Daughter    Social History  Substance Use Topics  . Smoking status: Former Smoker -- 3.00 packs/day for 10 years    Types: Cigarettes    Quit date: 07/11/1976  . Smokeless tobacco: Never Used     Comment: 3 PPD x 6 years  . Alcohol Use: 1.5 oz/week    3 Standard drinks or equivalent per week     Comment: 2 glasses of wine daily    Review of Systems  All systems reviewed and negative, other than as noted  in HPI.   Allergies  Penicillins  Home Medications   Prior to Admission medications   Medication Sig Start Date End Date Taking? Authorizing Provider  ascorbic acid (VITAMIN C) 500 MG tablet Take 500 mg by mouth 2 (two) times daily.    Yes Historical Provider, MD  bisacodyl (DULCOLAX) 5 MG EC tablet Take 5 mg by mouth daily as needed for moderate constipation.   Yes Historical Provider, MD  cholecalciferol (VITAMIN D) 1000 UNITS tablet Take 1,000 Units by mouth daily.   Yes Historical Provider, MD  dicyclomine (BENTYL) 10 MG capsule Take 1 capsule (10 mg total) by mouth 3 (three) times daily before meals. Patient taking differently: Take 10 mg by mouth 3 (three) times daily as needed for spasms.  06/25/15  Yes Rita Ohara, MD  fish oil-omega-3 fatty acids 1000 MG capsule Take 1 g by mouth  daily.    Yes Historical Provider, MD  furosemide (LASIX) 20 MG tablet Take 1 tablet (20 mg total) by mouth daily. Patient taking differently: Take 20 mg by mouth daily as needed for fluid.  09/08/15  Yes Curt Bears, MD  gabapentin (NEURONTIN) 100 MG capsule Take 1 capsule (100 mg total) by mouth 3 (three) times daily. 09/08/15  Yes Curt Bears, MD  lenalidomide (REVLIMID) 10 MG capsule Take 1 capsule (10 mg total) by mouth daily. 08/26/15  Yes Curt Bears, MD  methocarbamol (ROBAXIN) 500 MG tablet TAKE 1-2 TABLETS BY MOUTH EVERY 6 HOURS AS NEEDED FOR MUSCLE SPASMS 03/26/15  Yes Rita Ohara, MD  methylPREDNISolone (MEDROL DOSEPAK) 4 MG TBPK tablet Use as instructed 09/08/15  Yes Curt Bears, MD  Multiple Vitamin (MULTIVITAMIN WITH MINERALS) TABS Take 1 tablet by mouth daily.   Yes Historical Provider, MD  oxyCODONE-acetaminophen (PERCOCET/ROXICET) 5-325 MG tablet Take 1 tablet by mouth every 4 (four) hours as needed for severe pain. 07/27/15  Yes Curt Bears, MD  pantoprazole (PROTONIX) 40 MG tablet Take 1 tablet by mouth two  times daily 06/18/15  Yes Curt Bears, MD  Probiotic Product (ALIGN) 4 MG CAPS Take 1 capsule by mouth daily. Reported on 09/10/2015   Yes Historical Provider, MD  prochlorperazine (COMPAZINE) 10 MG tablet Take 1 tablet (10 mg total) by mouth every 6 (six) hours as needed. Patient taking differently: Take 10 mg by mouth every 6 (six) hours as needed for nausea.  04/16/14  Yes Curt Bears, MD  rivaroxaban (XARELTO) 20 MG TABS tablet Take 1 tablet (20 mg total) by mouth daily with supper. 07/27/15  Yes Curt Bears, MD  tadalafil (CIALIS) 20 MG tablet Take 1 tablet (20 mg total) by mouth daily as needed for erectile dysfunction. 03/03/15  Yes Denita Lung, MD  temazepam (RESTORIL) 15 MG capsule Take 1 capsule (15 mg total) by mouth at bedtime as needed. Patient taking differently: Take 15 mg by mouth at bedtime as needed for sleep.  07/27/15  Yes Curt Bears, MD  zolendronic acid (ZOMETA) 4 MG/5ML injection Inject 4 mg into the vein every 28 (twenty-eight) days.   Yes Historical Provider, MD  LORazepam (ATIVAN) 1 MG tablet Take 1 tablet (1 mg total) by mouth every 8 (eight) hours as needed for anxiety. Patient not taking: Reported on 09/10/2015 07/16/14   Gery Pray, MD   BP 126/90 mmHg  Pulse 61  Temp(Src) 98.7 F (37.1 C) (Oral)  Resp 16  SpO2 96% Physical Exam  Constitutional: He appears well-developed and well-nourished. No distress.  HENT:  Head: Normocephalic and atraumatic.  Eyes: Conjunctivae are normal. Right eye exhibits no discharge. Left eye exhibits no discharge.  Neck: Neck supple.  Cardiovascular: Normal rate, regular rhythm and normal heart sounds.  Exam reveals no gallop and no friction rub.   No murmur heard. Pulmonary/Chest: Effort normal and breath sounds normal. No respiratory distress.    Slight prominence of the chest wall in the pictured area. Nontender. No overlying skin changes.  Abdominal: Soft. He exhibits no distension. There is no tenderness.  Musculoskeletal: He exhibits no edema or tenderness.  Lower extremities symmetric as compared to each other. No calf tenderness. Negative Homan's. No palpable cords.   Neurological: He is alert.  Skin: Skin is warm and dry.  Psychiatric: He has a normal mood and affect. His behavior is normal. Thought content normal.  Nursing note and vitals reviewed.   ED Course  Procedures (including critical care time) Labs Review Labs Reviewed  BASIC METABOLIC PANEL - Abnormal; Notable for the following:    Glucose, Bld 116 (*)    All other components within normal limits  CBC - Abnormal; Notable for the following:    RBC 4.15 (*)    Hemoglobin 12.9 (*)    All other components within normal limits  I-STAT TROPOININ, ED    Imaging Review Dg Chest 2 View  09/10/2015  CLINICAL DATA:  No onset moderate to severe mid chest pain since 3 a.m. History of multiple  myeloma. EXAM: CHEST  2 VIEW COMPARISON:  Chest x-ray dated 04/28/2015. FINDINGS: Heart size is upper normal, unchanged. Overall cardiomediastinal silhouette is stable in size and configuration. Right chest wall Port-A-Cath remains well positioned. Probable mild scarring/atelectasis at each lung base. Stable nodular density at the right lung base is compatible with nipple shadows, as previously supposed. No new lung findings. No evidence of pneumonia. No pleural effusions seen. Degenerative changes are again seen throughout the thoracic spine with associated osseous spurring, stable compared to previous studies. No acute-appearing osseous abnormality. IMPRESSION: Stable chest x-ray. No evidence of acute cardiopulmonary abnormality. Electronically Signed   By: Franki Cabot M.D.   On: 09/10/2015 10:29   I have personally reviewed and evaluated these images and lab results as part of my medical decision-making.   EKG Interpretation   Date/Time:  Thursday September 10 2015 09:24:25 EST Ventricular Rate:  64 PR Interval:  235 QRS Duration: 126 QT Interval:  454 QTC Calculation: 468 R Axis:   13 Text Interpretation:  Sinus rhythm Multiple ventricular premature  complexes Prolonged PR interval Consider left atrial enlargement Confirmed  by Wilson Singer  MD, Venba Zenner ([id number]) on 09/10/2015 10:47:53 AM      MDM   Final diagnoses:  Chest pain, unspecified chest pain type    74yM with chest pain. Fairly atypical for ACS. Constant pain since around 0300 today and normal troponin drawn 7 hours later. His EKG is similar to previous from 01/2013 aside from PVCs on current. Hx of PE, but on xarelto. Not pleuritic. No respiratory complaints. He is not tachycardic or hypoxic. CXR w/o acute abnormality. He feels like he has some mild swelling/mass parasternally on R side. On palpation it feels like normal chest wall although I agree it is somewhat asymmetric as compared to L. He is mildly tender is this area. With his  history of multiple myeloma, he needs to discuss this further with his oncologist if this persists. He questions wether it may be side effect of zometa. I'm not familiar enough with typical side effects to give guidance in this  regard but, if so, I'm not sure why he would develop this now after taking it for several years. Regardless, I have a low suspicion for an emergent process at this point. Return precautions were discussed. Outpatient follow-up otherwise with oncology and/or PCP.     Virgel Manifold, MD 09/14/15 [phone]

## 2015-09-10 NOTE — Telephone Encounter (Signed)
Chest Xray reviewed with MD, notified pt Chest Xray stable. Prednisone and gabapentin not related to the Shortness of breath or chest pain. Pt verbalized understanding. No further concerns.

## 2015-09-10 NOTE — ED Notes (Signed)
Pt reports"I had a blood clot in my leg last year and it went to my lung.  That's why I am on Xarelto."  Pt recently travelled to the [place].

## 2015-09-10 NOTE — Telephone Encounter (Signed)
Patient called to inform Dr. [name] that he was in [facility] ED this morning with chest pain and SOB.  Patient wondering if taking prednisone and gabapentin together might cause this problem.  Patient would like a call back regarding this situation.

## 2015-09-16 DIAGNOSIS — D485 Neoplasm of uncertain behavior of skin: Secondary | ICD-10-CM | POA: Diagnosis not present

## 2015-09-16 DIAGNOSIS — L988 Other specified disorders of the skin and subcutaneous tissue: Secondary | ICD-10-CM | POA: Diagnosis not present

## 2015-09-16 DIAGNOSIS — L570 Actinic keratosis: Secondary | ICD-10-CM | POA: Diagnosis not present

## 2015-09-23 ENCOUNTER — Other Ambulatory Visit: Payer: Self-pay | Admitting: Medical Oncology

## 2015-09-23 DIAGNOSIS — C9000 Multiple myeloma not having achieved remission: Secondary | ICD-10-CM

## 2015-09-23 MED ORDER — LENALIDOMIDE 10 MG PO CAPS: 10.0000 mg | ORAL_CAPSULE | Freq: Every day | ORAL | Status: DC

## 2015-09-24 ENCOUNTER — Encounter: Payer: Self-pay | Admitting: Family Medicine

## 2015-09-28 ENCOUNTER — Other Ambulatory Visit: Payer: Self-pay | Admitting: Internal Medicine

## 2015-09-28 DIAGNOSIS — C9000 Multiple myeloma not having achieved remission: Secondary | ICD-10-CM

## 2015-09-30 ENCOUNTER — Ambulatory Visit: Payer: Self-pay

## 2015-10-13 ENCOUNTER — Telehealth: Payer: Self-pay | Admitting: Family Medicine

## 2015-10-13 MED ORDER — OSELTAMIVIR PHOSPHATE 75 MG PO CAPS: 75.0000 mg | ORAL_CAPSULE | Freq: Two times a day (BID) | ORAL | Status: DC

## 2015-10-13 NOTE — Telephone Encounter (Signed)
Patient texted me, reporting that he had just returned from a two day road trip to [place].  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  Pt is having some symptoms--ear ache, eyes burn and cough. He is requesting rx for tamiflu given his low immune system, to [place].

## 2015-10-20 ENCOUNTER — Telehealth: Payer: Self-pay | Admitting: Internal Medicine

## 2015-10-20 ENCOUNTER — Ambulatory Visit: Payer: Self-pay

## 2015-10-20 ENCOUNTER — Ambulatory Visit (HOSPITAL_BASED_OUTPATIENT_CLINIC_OR_DEPARTMENT_OTHER): Payer: Medicare Other | Admitting: Internal Medicine

## 2015-10-20 ENCOUNTER — Ambulatory Visit (HOSPITAL_BASED_OUTPATIENT_CLINIC_OR_DEPARTMENT_OTHER): Payer: Medicare Other

## 2015-10-20 ENCOUNTER — Other Ambulatory Visit (HOSPITAL_BASED_OUTPATIENT_CLINIC_OR_DEPARTMENT_OTHER): Payer: Medicare Other

## 2015-10-20 ENCOUNTER — Encounter: Payer: Self-pay | Admitting: Internal Medicine

## 2015-10-20 VITALS — BP 133/69 | HR 70 | Temp 98.1°F | Resp 18 | Wt 213.2 lb

## 2015-10-20 DIAGNOSIS — Z86711 Personal history of pulmonary embolism: Secondary | ICD-10-CM

## 2015-10-20 DIAGNOSIS — G893 Neoplasm related pain (acute) (chronic): Secondary | ICD-10-CM | POA: Diagnosis not present

## 2015-10-20 DIAGNOSIS — I2699 Other pulmonary embolism without acute cor pulmonale: Secondary | ICD-10-CM

## 2015-10-20 DIAGNOSIS — Z95828 Presence of other vascular implants and grafts: Secondary | ICD-10-CM

## 2015-10-20 DIAGNOSIS — C9000 Multiple myeloma not having achieved remission: Secondary | ICD-10-CM

## 2015-10-20 DIAGNOSIS — G620 Drug-induced polyneuropathy: Secondary | ICD-10-CM | POA: Diagnosis not present

## 2015-10-20 DIAGNOSIS — Z5111 Encounter for antineoplastic chemotherapy: Secondary | ICD-10-CM

## 2015-10-20 DIAGNOSIS — I82441 Acute embolism and thrombosis of right tibial vein: Secondary | ICD-10-CM

## 2015-10-20 DIAGNOSIS — R609 Edema, unspecified: Secondary | ICD-10-CM

## 2015-10-20 DIAGNOSIS — Z7901 Long term (current) use of anticoagulants: Secondary | ICD-10-CM

## 2015-10-20 DIAGNOSIS — G4700 Insomnia, unspecified: Secondary | ICD-10-CM | POA: Diagnosis not present

## 2015-10-20 HISTORY — DX: Encounter for antineoplastic chemotherapy: Z51.11

## 2015-10-20 LAB — CBC WITH DIFFERENTIAL/PLATELET
BASO%: 4.6 % — ABNORMAL HIGH (ref 0.0–2.0)
Basophils Absolute: 0.2 10*3/uL — ABNORMAL HIGH (ref 0.0–0.1)
EOS%: 5.1 % (ref 0.0–7.0)
Eosinophils Absolute: 0.2 10*3/uL (ref 0.0–0.5)
HCT: 40.5 % (ref 38.4–49.9)
HGB: 13.5 g/dL (ref 13.0–17.1)
LYMPH%: 18.5 % (ref 14.0–49.0)
MCH: 31 pg (ref 27.2–33.4)
MCHC: 33.3 g/dL (ref 32.0–36.0)
MCV: 93.3 fL (ref 79.3–98.0)
MONO#: 0.4 10*3/uL (ref 0.1–0.9)
MONO%: 11.4 % (ref 0.0–14.0)
NEUT#: 2.3 10*3/uL (ref 1.5–6.5)
NEUT%: 60.4 % (ref 39.0–75.0)
Platelets: 124 10*3/uL — ABNORMAL LOW (ref 140–400)
RBC: 4.34 10*6/uL (ref 4.20–5.82)
RDW: 15.1 % — ABNORMAL HIGH (ref 11.0–14.6)
WBC: 3.8 10*3/uL — ABNORMAL LOW (ref 4.0–10.3)
lymph#: 0.7 10*3/uL — ABNORMAL LOW (ref 0.9–3.3)

## 2015-10-20 LAB — COMPREHENSIVE METABOLIC PANEL
ALT: 22 U/L (ref 0–55)
ANION GAP: 8 meq/L (ref 3–11)
AST: 20 U/L (ref 5–34)
Albumin: 3.2 g/dL — ABNORMAL LOW (ref 3.5–5.0)
Alkaline Phosphatase: 52 U/L (ref 40–150)
BILIRUBIN TOTAL: 0.64 mg/dL (ref 0.20–1.20)
BUN: 11.8 mg/dL (ref 7.0–26.0)
CALCIUM: 9 mg/dL (ref 8.4–10.4)
CO2: 26 mEq/L (ref 22–29)
CREATININE: 0.9 mg/dL (ref 0.7–1.3)
Chloride: 106 mEq/L (ref 98–109)
EGFR: 80 mL/min/{1.73_m2} — ABNORMAL LOW (ref >90)
Glucose: 99 mg/dl (ref 70–140)
Potassium: 3.4 mEq/L — ABNORMAL LOW (ref 3.5–5.1)
Sodium: 140 mEq/L (ref 136–145)
TOTAL PROTEIN: 6.4 g/dL (ref 6.4–8.3)

## 2015-10-20 LAB — LACTATE DEHYDROGENASE: LDH: 164 U/L (ref 125–245)

## 2015-10-20 MED ORDER — HEPARIN SOD (PORK) LOCK FLUSH 100 UNIT/ML IV SOLN
500.0000 [IU] | Freq: Once | INTRAVENOUS | Status: AC
  Administered 2015-10-20: 500 [IU] via INTRAVENOUS
  Filled 2015-10-20: qty 5

## 2015-10-20 MED ORDER — SODIUM CHLORIDE 0.9% FLUSH
10.0000 mL | INTRAVENOUS | Status: DC | PRN
  Administered 2015-10-20: 10 mL via INTRAVENOUS
  Filled 2015-10-20: qty 10

## 2015-10-20 NOTE — Telephone Encounter (Signed)
Gave and printed appt sched and avs fo rpt for May °

## 2015-10-20 NOTE — Progress Notes (Signed)
[facility] [phone]   Fax:([phone]) [place], [place] [address]  DIAGNOSIS: Stage IIA multiple myeloma diagnosed in July of 2013.   PRIOR THERAPY:  1) Systemic chemotherapy with subcutaneous Velcade 1.3 mg/M2 on days 1, 4, 8 and 11 every 3 weeks in addition to Revlimid 25 mg by mouth daily for 14 days every 3 weeks and dexamethasone 40 mg on a weekly basis. Status post 4 planned cycles.  2) Systemic chemotherapy with subcutaneous Velcade 1.3 mg/M2 on days 1, 4, 8 and 11 every 3 weeks in addition to Revlimid 25 mg by mouth daily for 14 days every 3 weeks and dexamethasone 40 mg on a weekly basis. 4 more cycles are planned and he is status post 4 cycles of the second set of 4 cycles, now status post a total of 10 cycles. 3) Systemic chemotherapy with Carfilzomib 36 mg/M2 on days 1, 2, 8, 9, 15 and 16 in addition to cyclophosphamide 300 mg/M2 IV on days 1, 8 and 15 as well as dexamethasone on days 1, 8, 15 and 22 every 4 weeks. Status post 4 cycles. 4) Zometa 4 mg IV every month. 5) autologous peripheral blood stem cell transplant on 03/20/2013 at [facility]. 6) Consolidation chemotherapy with Carfilzomib 36 mg/M2 on days 1, 2, 8, 9, 15 and 16 every 4 weeks in addition to cyclophosphamide 300 mg/M2 on days 1, 8 and 15 as well as dexamethasone 40 mg on a weekly basis every 4 weeks. Status post 2 cycles. First cycle 06/11/2013. 7) Maintenance therapy with Revlimid 10 mg by mouth daily status post 3 months of treatment. 8) palliative radiotherapy to the lytic lesion in the skull under the care of Dr. Sondra Come 9) Maintenance therapy with Revlimid 15 mg by mouth daily started 11/09/2013 and discontinued 02/18/2015 secondary to persistent neutropenia.   CURRENT THERAPY:  1) Maintenance therapy with Revlimid 10 mg by mouth daily started 02/19/2015. 2) Zometa 4 mg IV every 3 months. 3) Xarelto 20 mg by  mouth daily for pulmonary embolism.   INTERVAL HISTORY: CHE RACHAL 75 y.o. male returns to the clinic today for routine monthly followup visit. The patient is feeling fine today with no specific complaints. He denied having any significant fatigue or weakness. He denied having any fever or chills. He has no nausea or vomiting.  He denied having any significant chest pain, shortness of breath, cough or hemoptysis. He has no significant weight loss or night sweats. He had repeat CBC and comprehensive metabolic panel performed earlier today and he is here for evaluation and discussion of his lab results.  MEDICAL HISTORY: Past Medical History  Diagnosis Date  . GERD (gastroesophageal reflux disease)   . Adenomatous polyp of colon 1996  . Arthritis     Osteoarthritis right knee, spine, wrist  . Benign prostatic hypertrophy     (Dr. Karsten Ro in past)  . History of chronic prostatitis   . History of melanoma     Back (Dr. Wilhemina Bonito); early melanoma R arm 03/2011  . Diverticulosis   . Arthritis of hand, right     Right thumb (Dr. Daylene Katayama)  . Degenerative disc disease   . Hemorrhoids   . Erosive esophagitis   . Back ache     r/o Multiple Myeloma  . Multiple myeloma ([place]) 2013    skull lytic lesions--treated with radiation 07/2014  . DVT (deep venous thrombosis) ([place])  01/2014    R calf  . Pulmonary embolism ([place]) 01/2014  . Radiation 07/21/14-08/05/14    left occipital parietal skull 25 gray  . Hiatal hernia   . SCC (squamous cell carcinoma) 08/04/15    right ear;dysplastic nevus with severe atypia-chest    ALLERGIES:  is allergic to penicillins.  MEDICATIONS:  Current Outpatient Prescriptions  Medication Sig Dispense Refill  . ascorbic acid (VITAMIN C) 500 MG tablet Take 500 mg by mouth 2 (two) times daily.     . bisacodyl (DULCOLAX) 5 MG EC tablet Take 5 mg by mouth daily as needed for moderate constipation.    . cholecalciferol (VITAMIN D) 1000 UNITS tablet Take 1,000 Units by  mouth daily.    Marland Kitchen dicyclomine (BENTYL) 10 MG capsule Take 1 capsule (10 mg total) by mouth 3 (three) times daily before meals. (Patient taking differently: Take 10 mg by mouth 3 (three) times daily as needed for spasms. ) 60 capsule 0  . fish oil-omega-3 fatty acids 1000 MG capsule Take 1 g by mouth daily.     . furosemide (LASIX) 20 MG tablet Take 1 tablet (20 mg total) by mouth daily. (Patient taking differently: Take 20 mg by mouth daily as needed for fluid. ) 20 tablet 0  . gabapentin (NEURONTIN) 100 MG capsule Take 1 capsule (100 mg total) by mouth 3 (three) times daily. 90 capsule 2  . lenalidomide (REVLIMID) 10 MG capsule Take 1 capsule (10 mg total) by mouth daily. 28 capsule 0  . LORazepam (ATIVAN) 1 MG tablet Take 1 tablet (1 mg total) by mouth every 8 (eight) hours as needed for anxiety. 30 tablet 0  . methocarbamol (ROBAXIN) 500 MG tablet TAKE 1-2 TABLETS BY MOUTH EVERY 6 HOURS AS NEEDED FOR MUSCLE SPASMS 60 tablet 0  . methylPREDNISolone (MEDROL DOSEPAK) 4 MG TBPK tablet Use as instructed 21 tablet 0  . Multiple Vitamin (MULTIVITAMIN WITH MINERALS) TABS Take 1 tablet by mouth daily.    Marland Kitchen oseltamivir (TAMIFLU) 75 MG capsule Take 1 capsule (75 mg total) by mouth 2 (two) times daily. 10 capsule 0  . oxyCODONE-acetaminophen (PERCOCET/ROXICET) 5-325 MG tablet Take 1 tablet by mouth every 4 (four) hours as needed for severe pain. 60 tablet 0  . pantoprazole (PROTONIX) 40 MG tablet Take 1 tablet by mouth two  times daily 180 tablet 0  . Probiotic Product (ALIGN) 4 MG CAPS Take 1 capsule by mouth daily. Reported on 09/10/2015    . prochlorperazine (COMPAZINE) 10 MG tablet Take 1 tablet (10 mg total) by mouth every 6 (six) hours as needed. (Patient taking differently: Take 10 mg by mouth every 6 (six) hours as needed for nausea. ) 30 tablet 1  . rivaroxaban (XARELTO) 20 MG TABS tablet Take 1 tablet (20 mg total) by mouth daily with supper. 90 tablet 0  . tadalafil (CIALIS) 20 MG tablet Take 1  tablet (20 mg total) by mouth daily as needed for erectile dysfunction. 30 tablet 5  . temazepam (RESTORIL) 15 MG capsule Take 1 capsule (15 mg total) by mouth at bedtime as needed. (Patient taking differently: Take 15 mg by mouth at bedtime as needed for sleep. ) 30 capsule 0  . zolendronic acid (ZOMETA) 4 MG/5ML injection Inject 4 mg into the vein every 28 (twenty-eight) days.     No current facility-administered medications for this visit.   Facility-Administered Medications Ordered in Other Visits  Medication Dose Route Frequency Provider Last Rate Last Dose  . ondansetron (ZOFRAN) tablet 8  mg  8 mg Oral Once Si Gaul, MD        SURGICAL HISTORY:  Past Surgical History  Procedure Laterality Date  . Total knee replaced  2000    Left knee  . Appendectomy    . Colonoscopy  2009    adenomatous polyp  . Esophagogastroduodenoscopy  2009    mild inflammation at esophagogastric junction  . Excision of melanoma      back ( x3)  . Great toe surgery      bilateral  . Inguinal hernia repair      left, x 2  . Total knee arthroplasty  06/08/2011    Procedure: TOTAL KNEE ARTHROPLASTY;  Surgeon: Loreta Ave, MD;  Location: [facility] OR;  Service: Orthopedics;  Laterality: Right;  osteonics  . [place]  10/2012  . Bone marrow transplant  03/2013    Stem Cell    REVIEW OF SYSTEMS:  A comprehensive review of systems was negative.   PHYSICAL EXAMINATION: General appearance: alert, cooperative and no distress Head: Normocephalic, without obvious abnormality, atraumatic, Ecchymosis at the right frontal area of the face. Neck: no adenopathy, no JVD, supple, symmetrical, trachea midline and thyroid not enlarged, symmetric, no tenderness/mass/nodules Lymph nodes: Cervical, supraclavicular, and axillary nodes normal. Resp: clear to auscultation bilaterally Back: symmetric, no curvature. ROM normal. No CVA tenderness. Cardio: regular rate and rhythm, S1, S2 normal, no murmur, click, rub or  gallop GI: soft, non-tender; bowel sounds normal; no masses,  no organomegaly Extremities: extremities normal, atraumatic, no cyanosis or edema Neurologic: Alert and oriented X 3, normal strength and tone. Normal symmetric reflexes. Normal coordination and gait  ECOG PERFORMANCE STATUS: 1 - Symptomatic but completely ambulatory  Blood pressure 133/69, pulse 70, temperature 98.1 F (36.7 C), temperature source Oral, resp. rate 18, weight 213 lb 3.2 oz (96.707 kg), SpO2 99 %.  LABORATORY DATA: Lab Results  Component Value Date   WBC 3.8* 10/20/2015   HGB 13.5 10/20/2015   HCT 40.5 10/20/2015   MCV 93.3 10/20/2015   PLT 124* 10/20/2015      Chemistry      Component Value Date/Time   NA 140 10/20/2015 0826   NA 142 09/10/2015 0954   K 3.4* 10/20/2015 0826   K 3.8 09/10/2015 0954   CL 108 09/10/2015 0954   CL 109* 12/31/2012 0759   CO2 26 10/20/2015 0826   CO2 26 09/10/2015 0954   BUN 11.8 10/20/2015 0826   BUN 17 09/10/2015 0954   CREATININE 0.9 10/20/2015 0826   CREATININE 0.93 09/10/2015 0954   CREATININE 0.97 11/18/2013 1045      Component Value Date/Time   CALCIUM 9.0 10/20/2015 0826   CALCIUM 9.2 09/10/2015 0954   ALKPHOS 52 10/20/2015 0826   ALKPHOS 47 04/22/2014 1417   AST 20 10/20/2015 0826   AST 18 04/22/2014 1417   ALT 22 10/20/2015 0826   ALT 22 04/22/2014 1417   BILITOT 0.64 10/20/2015 0826   BILITOT 0.8 04/22/2014 1417      RADIOGRAPHIC STUDIES: No results found.  ASSESSMENT AND PLAN: This is a very pleasant 75 years old white male with history of multiple myeloma status post induction chemotherapy with Velcade, Revlimid and dexamethasone followed by treatment with Carfilzomib, cyclophosphamide and dexamethasone with significant improvement in his disease followed by autologous peripheral blood stem cell transplant at [facility] on 03/20/2013. This was followed by 2 cycles of consolidation chemotherapy with Carfilzomib, Cytoxan and dexamethasone,  and he tolerated it fairly well.  He completed maintenance Revlimid 10 mg by mouth daily for 3 months and tolerating it fairly well. He started maintenance Revlimid with 15 mg by mouth daily in May 2015 and tolerating it well except for the persistent neutropenia.  He started the reduced dose of Revlimid 10 mg by mouth daily on 02/19/2015. He is tolerating this treatment much better.  I recommended for him to continue his current treatment with Revlimid 10 mg by mouth daily. He will continue treatment with Zometa every 3 months.  For the pulmonary embolism, he will continue on Xarelto 20 mg by mouth daily as scheduled.  For insomnia he will continue on Restoril. For the bone pain in the skull, I started the patient on Medrol Dosepak. For the peripheral neuropathy, I started the patient on Neurontin 100 mg by mouth 3 times a day. He would come back for follow-up visit in 6 weeks for reevaluation with repeat myeloma panel. He was advised to call immediately if he has any concerning symptoms in the interval. The patient voices understanding of current disease status and treatment options and is in agreement with the current care plan.  All questions were answered. The patient knows to call the clinic with any problems, questions or concerns. We can certainly see the patient much sooner if necessary.  Disclaimer: This note was dictated with voice recognition software. Similar sounding words can inadvertently be transcribed and may not be corrected upon review.

## 2015-10-27 DIAGNOSIS — J069 Acute upper respiratory infection, unspecified: Secondary | ICD-10-CM | POA: Diagnosis not present

## 2015-10-29 ENCOUNTER — Encounter: Payer: Self-pay | Admitting: Family Medicine

## 2015-10-29 ENCOUNTER — Other Ambulatory Visit: Payer: Self-pay | Admitting: Medical Oncology

## 2015-10-29 DIAGNOSIS — C9000 Multiple myeloma not having achieved remission: Secondary | ICD-10-CM

## 2015-10-29 MED ORDER — LENALIDOMIDE 10 MG PO CAPS: 10.0000 mg | ORAL_CAPSULE | Freq: Every day | ORAL | Status: DC

## 2015-11-10 DIAGNOSIS — Z0000 Encounter for general adult medical examination without abnormal findings: Secondary | ICD-10-CM | POA: Diagnosis not present

## 2015-11-10 DIAGNOSIS — N401 Enlarged prostate with lower urinary tract symptoms: Secondary | ICD-10-CM | POA: Diagnosis not present

## 2015-11-10 DIAGNOSIS — Z125 Encounter for screening for malignant neoplasm of prostate: Secondary | ICD-10-CM | POA: Diagnosis not present

## 2015-11-10 DIAGNOSIS — N5201 Erectile dysfunction due to arterial insufficiency: Secondary | ICD-10-CM | POA: Diagnosis not present

## 2015-11-10 DIAGNOSIS — N138 Other obstructive and reflux uropathy: Secondary | ICD-10-CM | POA: Diagnosis not present

## 2015-11-10 DIAGNOSIS — N486 Induration penis plastica: Secondary | ICD-10-CM | POA: Diagnosis not present

## 2015-11-22 ENCOUNTER — Other Ambulatory Visit: Payer: Self-pay | Admitting: Internal Medicine

## 2015-11-27 ENCOUNTER — Other Ambulatory Visit: Payer: Self-pay | Admitting: Medical Oncology

## 2015-11-27 DIAGNOSIS — C9000 Multiple myeloma not having achieved remission: Secondary | ICD-10-CM

## 2015-11-27 MED ORDER — LENALIDOMIDE 10 MG PO CAPS: 10.0000 mg | ORAL_CAPSULE | Freq: Every day | ORAL | Status: DC

## 2015-11-30 ENCOUNTER — Other Ambulatory Visit: Payer: Self-pay | Admitting: Internal Medicine

## 2015-12-01 ENCOUNTER — Other Ambulatory Visit (HOSPITAL_BASED_OUTPATIENT_CLINIC_OR_DEPARTMENT_OTHER): Payer: Medicare Other

## 2015-12-01 DIAGNOSIS — C9000 Multiple myeloma not having achieved remission: Secondary | ICD-10-CM | POA: Diagnosis not present

## 2015-12-01 DIAGNOSIS — Z7901 Long term (current) use of anticoagulants: Secondary | ICD-10-CM | POA: Diagnosis not present

## 2015-12-01 DIAGNOSIS — Z5111 Encounter for antineoplastic chemotherapy: Secondary | ICD-10-CM | POA: Diagnosis not present

## 2015-12-01 DIAGNOSIS — C44529 Squamous cell carcinoma of skin of other part of trunk: Secondary | ICD-10-CM | POA: Diagnosis not present

## 2015-12-01 DIAGNOSIS — I2699 Other pulmonary embolism without acute cor pulmonale: Secondary | ICD-10-CM

## 2015-12-01 LAB — CBC WITH DIFFERENTIAL/PLATELET
BASO%: 5.8 % — ABNORMAL HIGH (ref 0.0–2.0)
BASOS ABS: 0.2 10*3/uL — AB (ref 0.0–0.1)
EOS ABS: 0.3 10*3/uL (ref 0.0–0.5)
EOS%: 8.2 % — ABNORMAL HIGH (ref 0.0–7.0)
HCT: 39.5 % (ref 38.4–49.9)
HGB: 13.5 g/dL (ref 13.0–17.1)
LYMPH#: 0.7 10*3/uL — AB (ref 0.9–3.3)
LYMPH%: 21 % (ref 14.0–49.0)
MCH: 31.8 pg (ref 27.2–33.4)
MCHC: 34.2 g/dL (ref 32.0–36.0)
MCV: 93.2 fL (ref 79.3–98.0)
MONO#: 0.4 10*3/uL (ref 0.1–0.9)
MONO%: 11.2 % (ref 0.0–14.0)
NEUT#: 1.8 10*3/uL (ref 1.5–6.5)
NEUT%: 53.8 % (ref 39.0–75.0)
NRBC: 0 % (ref 0–0)
PLATELETS: 146 10*3/uL (ref 140–400)
RBC: 4.24 10*6/uL (ref 4.20–5.82)
RDW: 15.1 % — AB (ref 11.0–14.6)
WBC: 3.3 10*3/uL — ABNORMAL LOW (ref 4.0–10.3)

## 2015-12-01 LAB — COMPREHENSIVE METABOLIC PANEL
ALBUMIN: 3.5 g/dL (ref 3.5–5.0)
ALK PHOS: 45 U/L (ref 40–150)
ALT: 24 U/L (ref 0–55)
ANION GAP: 7 meq/L (ref 3–11)
AST: 21 U/L (ref 5–34)
BILIRUBIN TOTAL: 0.68 mg/dL (ref 0.20–1.20)
BUN: 9 mg/dL (ref 7.0–26.0)
CO2: 24 meq/L (ref 22–29)
Calcium: 9.4 mg/dL (ref 8.4–10.4)
Chloride: 108 mEq/L (ref 98–109)
Creatinine: 0.9 mg/dL (ref 0.7–1.3)
EGFR: 82 mL/min/{1.73_m2} — AB (ref >90)
Glucose: 103 mg/dl (ref 70–140)
Potassium: 3.7 mEq/L (ref 3.5–5.1)
Sodium: 139 mEq/L (ref 136–145)
TOTAL PROTEIN: 6.7 g/dL (ref 6.4–8.3)

## 2015-12-01 LAB — LACTATE DEHYDROGENASE: LDH: 162 U/L (ref 125–245)

## 2015-12-02 LAB — KAPPA/LAMBDA LIGHT CHAINS
IG KAPPA FREE LIGHT CHAIN: 43.19 mg/L — AB (ref 3.30–19.40)
IG LAMBDA FREE LIGHT CHAIN: 29.69 mg/L — AB (ref 5.71–26.30)
KAPPA/LAMBDA FLC RATIO: 1.45 (ref 0.26–1.65)

## 2015-12-02 LAB — IGG, IGA, IGM
IGA/IMMUNOGLOBULIN A, SERUM: 166 mg/dL (ref 61–437)
IGM (IMMUNOGLOBIN M), SRM: 26 mg/dL (ref 15–143)

## 2015-12-02 LAB — BETA 2 MICROGLOBULIN, SERUM: Beta-2: 1.7 mg/L (ref 0.6–2.4)

## 2015-12-08 ENCOUNTER — Encounter: Payer: Self-pay | Admitting: Internal Medicine

## 2015-12-08 ENCOUNTER — Ambulatory Visit (HOSPITAL_BASED_OUTPATIENT_CLINIC_OR_DEPARTMENT_OTHER): Payer: Medicare Other

## 2015-12-08 ENCOUNTER — Ambulatory Visit: Payer: Medicare Other

## 2015-12-08 ENCOUNTER — Other Ambulatory Visit (HOSPITAL_BASED_OUTPATIENT_CLINIC_OR_DEPARTMENT_OTHER): Payer: Medicare Other

## 2015-12-08 ENCOUNTER — Telehealth: Payer: Self-pay | Admitting: Internal Medicine

## 2015-12-08 ENCOUNTER — Ambulatory Visit (HOSPITAL_BASED_OUTPATIENT_CLINIC_OR_DEPARTMENT_OTHER): Payer: Medicare Other | Admitting: Internal Medicine

## 2015-12-08 VITALS — BP 124/51 | HR 57 | Temp 98.1°F | Resp 18 | Ht 69.5 in | Wt 215.4 lb

## 2015-12-08 DIAGNOSIS — C9000 Multiple myeloma not having achieved remission: Secondary | ICD-10-CM

## 2015-12-08 DIAGNOSIS — G4700 Insomnia, unspecified: Secondary | ICD-10-CM

## 2015-12-08 DIAGNOSIS — Z7901 Long term (current) use of anticoagulants: Secondary | ICD-10-CM | POA: Diagnosis not present

## 2015-12-08 DIAGNOSIS — Z95828 Presence of other vascular implants and grafts: Secondary | ICD-10-CM

## 2015-12-08 DIAGNOSIS — G620 Drug-induced polyneuropathy: Secondary | ICD-10-CM | POA: Diagnosis not present

## 2015-12-08 DIAGNOSIS — Z86711 Personal history of pulmonary embolism: Secondary | ICD-10-CM

## 2015-12-08 LAB — BASIC METABOLIC PANEL
ANION GAP: 7 meq/L (ref 3–11)
BUN: 10.8 mg/dL (ref 7.0–26.0)
CO2: 25 mEq/L (ref 22–29)
Calcium: 9 mg/dL (ref 8.4–10.4)
Chloride: 107 mEq/L (ref 98–109)
Creatinine: 0.9 mg/dL (ref 0.7–1.3)
EGFR: 79 mL/min/{1.73_m2} — AB (ref >90)
GLUCOSE: 91 mg/dL (ref 70–140)
POTASSIUM: 3.8 meq/L (ref 3.5–5.1)
SODIUM: 139 meq/L (ref 136–145)

## 2015-12-08 MED ORDER — SODIUM CHLORIDE 0.9% FLUSH
10.0000 mL | INTRAVENOUS | Status: DC | PRN
  Administered 2015-12-08: 10 mL via INTRAVENOUS
  Filled 2015-12-08: qty 10

## 2015-12-08 MED ORDER — LORAZEPAM 1 MG PO TABS: 1.0000 mg | ORAL_TABLET | Freq: Three times a day (TID) | ORAL | Status: DC | PRN

## 2015-12-08 MED ORDER — OXYCODONE-ACETAMINOPHEN 5-325 MG PO TABS: 1.0000 | ORAL_TABLET | ORAL | Status: DC | PRN

## 2015-12-08 MED ORDER — ZOLEDRONIC ACID 4 MG/100ML IV SOLN
4.0000 mg | Freq: Once | INTRAVENOUS | Status: AC
  Administered 2015-12-08: 4 mg via INTRAVENOUS
  Filled 2015-12-08: qty 100

## 2015-12-08 MED ORDER — TEMAZEPAM 15 MG PO CAPS: 15.0000 mg | ORAL_CAPSULE | Freq: Every evening | ORAL | Status: DC | PRN

## 2015-12-08 MED ORDER — SODIUM CHLORIDE 0.9 % IV SOLN
Freq: Once | INTRAVENOUS | Status: AC
  Administered 2015-12-08: 10:00:00 via INTRAVENOUS

## 2015-12-08 MED ORDER — LENALIDOMIDE 10 MG PO CAPS: 10.0000 mg | ORAL_CAPSULE | Freq: Every day | ORAL | Status: DC

## 2015-12-08 MED ORDER — HEPARIN SOD (PORK) LOCK FLUSH 100 UNIT/ML IV SOLN
500.0000 [IU] | Freq: Once | INTRAVENOUS | Status: AC | PRN
  Administered 2015-12-08: 500 [IU]
  Filled 2015-12-08: qty 5

## 2015-12-08 MED ORDER — SODIUM CHLORIDE 0.9 % IJ SOLN
10.0000 mL | INTRAMUSCULAR | Status: DC | PRN
  Administered 2015-12-08: 10 mL
  Filled 2015-12-08: qty 10

## 2015-12-08 NOTE — Patient Instructions (Signed)

## 2015-12-08 NOTE — Patient Instructions (Signed)

## 2015-12-08 NOTE — Progress Notes (Signed)
[facility] [phone]   [phone] Helen, MD [place] [address]  DIAGNOSIS: Stage IIA multiple myeloma diagnosed in July of 2013.   PRIOR THERAPY:  1) Systemic chemotherapy with subcutaneous Velcade 1.3 mg/M2 on days 1, 4, 8 and 11 every 3 weeks in addition to Revlimid 25 mg by mouth daily for 14 days every 3 weeks and dexamethasone 40 mg on a weekly basis. Status post 4 planned cycles.  2) Systemic chemotherapy with subcutaneous Velcade 1.3 mg/M2 on days 1, 4, 8 and 11 every 3 weeks in addition to Revlimid 25 mg by mouth daily for 14 days every 3 weeks and dexamethasone 40 mg on a weekly basis. 4 more cycles are planned and he is status post 4 cycles of the second set of 4 cycles, now status post a total of 10 cycles. 3) Systemic chemotherapy with Carfilzomib 36 mg/M2 on days 1, 2, 8, 9, 15 and 16 in addition to cyclophosphamide 300 mg/M2 IV on days 1, 8 and 15 as well as dexamethasone on days 1, 8, 15 and 22 every 4 weeks. Status post 4 cycles. 4) Zometa 4 mg IV every month. 5) autologous peripheral blood stem cell transplant on 03/20/2013 at [facility]. 6) Consolidation chemotherapy with Carfilzomib 36 mg/M2 on days 1, 2, 8, 9, 15 and 16 every 4 weeks in addition to cyclophosphamide 300 mg/M2 on days 1, 8 and 15 as well as dexamethasone 40 mg on a weekly basis every 4 weeks. Status post 2 cycles. First cycle 06/11/2013. 7) Maintenance therapy with Revlimid 10 mg by mouth daily status post 3 months of treatment. 8) palliative radiotherapy to the lytic lesion in the skull under the care of Dr. Sondra Come 9) Maintenance therapy with Revlimid 15 mg by mouth daily started 11/09/2013 and discontinued 02/18/2015 secondary to persistent neutropenia.   CURRENT THERAPY:  1) Maintenance therapy with Revlimid 10 mg by mouth daily started 02/19/2015. 2) Zometa 4 mg IV every 3 months. 3) Xarelto 20 mg by  mouth daily for pulmonary embolism.   INTERVAL HISTORY: [name] 75 y.o. male returns to the clinic today for routine monthly followup visit. The patient is feeling fine today with no specific complaints. He denied having any significant fatigue or weakness. He denied having any fever or chills. He has no nausea or vomiting.  He denied having any significant chest pain, shortness of breath, cough or hemoptysis. He has no significant weight loss or night sweats. He is planning a long trip to [place] on July 9 and requested a refill of his medications for 2 months. He had repeat myeloma panel performed recently and he is here for evaluation and discussion of his lab results.  MEDICAL HISTORY: Past Medical History  Diagnosis Date  . GERD (gastroesophageal reflux disease)   . Adenomatous polyp of colon 1996  . Arthritis     Osteoarthritis right knee, spine, wrist  . Benign prostatic hypertrophy     (Dr. Karsten Ro in past)  . History of chronic prostatitis   . History of melanoma     Back (Dr. Wilhemina Bonito); early melanoma R arm 03/2011  . Diverticulosis   . Arthritis of hand, right     Right thumb (Dr. Daylene Katayama)  . Degenerative disc disease   . Hemorrhoids   . Erosive esophagitis   . Back ache     r/o Multiple Myeloma  . Multiple myeloma ([place])  2013    skull lytic lesions--treated with radiation 07/2014  . DVT (deep venous thrombosis) ([place]) 01/2014    R calf  . Pulmonary embolism ([place]) 01/2014  . Radiation 07/21/14-08/05/14    left occipital parietal skull 25 gray  . Hiatal hernia   . SCC (squamous cell carcinoma) 08/04/15    right ear;dysplastic nevus with severe atypia-chest  . Encounter for antineoplastic chemotherapy 10/20/2015    ALLERGIES:  is allergic to penicillins.  MEDICATIONS:  Current Outpatient Prescriptions  Medication Sig Dispense Refill  . ascorbic acid (VITAMIN C) 500 MG tablet Take 500 mg by mouth 2 (two) times daily.     . bisacodyl (DULCOLAX) 5 MG EC tablet Take 5  mg by mouth daily as needed for moderate constipation.    . cholecalciferol (VITAMIN D) 1000 UNITS tablet Take 1,000 Units by mouth daily.    Marland Kitchen dicyclomine (BENTYL) 10 MG capsule Take 1 capsule (10 mg total) by mouth 3 (three) times daily before meals. (Patient taking differently: Take 10 mg by mouth 3 (three) times daily as needed for spasms. ) 60 capsule 0  . fish oil-omega-3 fatty acids 1000 MG capsule Take 1 g by mouth daily.     . furosemide (LASIX) 20 MG tablet Take 1 tablet (20 mg total) by mouth daily. (Patient taking differently: Take 20 mg by mouth daily as needed for fluid. ) 20 tablet 0  . gabapentin (NEURONTIN) 100 MG capsule Take 1 capsule (100 mg total) by mouth 3 (three) times daily. 90 capsule 2  . lenalidomide (REVLIMID) 10 MG capsule Take 1 capsule (10 mg total) by mouth daily. 28 capsule 0  . LORazepam (ATIVAN) 1 MG tablet Take 1 tablet (1 mg total) by mouth every 8 (eight) hours as needed for anxiety. 30 tablet 0  . methocarbamol (ROBAXIN) 500 MG tablet TAKE 1-2 TABLETS BY MOUTH EVERY 6 HOURS AS NEEDED FOR MUSCLE SPASMS 60 tablet 0  . methylPREDNISolone (MEDROL DOSEPAK) 4 MG TBPK tablet Use as instructed 21 tablet 0  . Multiple Vitamin (MULTIVITAMIN WITH MINERALS) TABS Take 1 tablet by mouth daily.    Marland Kitchen oseltamivir (TAMIFLU) 75 MG capsule Take 1 capsule (75 mg total) by mouth 2 (two) times daily. 10 capsule 0  . oxyCODONE-acetaminophen (PERCOCET/ROXICET) 5-325 MG tablet Take 1 tablet by mouth every 4 (four) hours as needed for severe pain. 60 tablet 0  . pantoprazole (PROTONIX) 40 MG tablet Take 1 tablet by mouth two  times daily 180 tablet 1  . Probiotic Product (ALIGN) 4 MG CAPS Take 1 capsule by mouth daily. Reported on 09/10/2015    . prochlorperazine (COMPAZINE) 10 MG tablet Take 1 tablet (10 mg total) by mouth every 6 (six) hours as needed. (Patient taking differently: Take 10 mg by mouth every 6 (six) hours as needed for nausea. ) 30 tablet 1  . tadalafil (CIALIS) 20 MG  tablet Take 1 tablet (20 mg total) by mouth daily as needed for erectile dysfunction. 30 tablet 5  . temazepam (RESTORIL) 15 MG capsule Take 1 capsule (15 mg total) by mouth at bedtime as needed. (Patient taking differently: Take 15 mg by mouth at bedtime as needed for sleep. ) 30 capsule 0  . XARELTO 20 MG TABS tablet Take 1 tablet by mouth  daily 90 tablet 0  . zolendronic acid (ZOMETA) 4 MG/5ML injection Inject 4 mg into the vein every 28 (twenty-eight) days.     No current facility-administered medications for this visit.   Facility-Administered Medications Ordered  in Other Visits  Medication Dose Route Frequency Provider Last Rate Last Dose  . ondansetron (ZOFRAN) tablet 8 mg  8 mg Oral Once Curt Bears, MD        SURGICAL HISTORY:  Past Surgical History  Procedure Laterality Date  . Total knee replaced  2000    Left knee  . Appendectomy    . Colonoscopy  2009    adenomatous polyp  . Esophagogastroduodenoscopy  2009    mild inflammation at esophagogastric junction  . Excision of melanoma      back ( x3)  . Great toe surgery      bilateral  . Inguinal hernia repair      left, x 2  . Total knee arthroplasty  06/08/2011    Procedure: TOTAL KNEE ARTHROPLASTY;  Surgeon: Ninetta Lights, MD;  Location: [facility];  Service: Orthopedics;  Laterality: Right;  osteonics  . [place]  10/2012  . Bone marrow transplant  03/2013    Stem Cell    REVIEW OF SYSTEMS:  Constitutional: negative Eyes: negative Ears, nose, mouth, throat, and face: negative Respiratory: negative Cardiovascular: negative Gastrointestinal: negative Genitourinary:negative Integument/breast: negative Hematologic/lymphatic: negative Musculoskeletal:negative Neurological: negative Behavioral/Psych: negative Endocrine: negative Allergic/Immunologic: negative   PHYSICAL EXAMINATION: General appearance: alert, cooperative and no distress Head: Normocephalic, without obvious abnormality, atraumatic,  Ecchymosis at the right frontal area of the face. Neck: no adenopathy, no JVD, supple, symmetrical, trachea midline and thyroid not enlarged, symmetric, no tenderness/mass/nodules Lymph nodes: Cervical, supraclavicular, and axillary nodes normal. Resp: clear to auscultation bilaterally Back: symmetric, no curvature. ROM normal. No CVA tenderness. Cardio: regular rate and rhythm, S1, S2 normal, no murmur, click, rub or gallop GI: soft, non-tender; bowel sounds normal; no masses,  no organomegaly Extremities: extremities normal, atraumatic, no cyanosis or edema Neurologic: Alert and oriented X 3, normal strength and tone. Normal symmetric reflexes. Normal coordination and gait  ECOG PERFORMANCE STATUS: 1 - Symptomatic but completely ambulatory  Blood pressure 124/51, pulse 57, temperature 98.1 F (36.7 C), temperature source Oral, resp. rate 18, height 5' 9.5" (1.765 m), weight 215 lb 6.4 oz (97.705 kg), SpO2 98 %.  LABORATORY DATA: Lab Results  Component Value Date   WBC 3.3* 12/01/2015   HGB 13.5 12/01/2015   HCT 39.5 12/01/2015   MCV 93.2 12/01/2015   PLT 146 12/01/2015      Chemistry      Component Value Date/Time   NA 139 12/01/2015 0829   NA 142 09/10/2015 0954   K 3.7 12/01/2015 0829   K 3.8 09/10/2015 0954   CL 108 09/10/2015 0954   CL 109* 12/31/2012 0759   CO2 24 12/01/2015 0829   CO2 26 09/10/2015 0954   BUN 9.0 12/01/2015 0829   BUN 17 09/10/2015 0954   CREATININE 0.9 12/01/2015 0829   CREATININE 0.93 09/10/2015 0954   CREATININE 0.97 11/18/2013 1045      Component Value Date/Time   CALCIUM 9.4 12/01/2015 0829   CALCIUM 9.2 09/10/2015 0954   ALKPHOS 45 12/01/2015 0829   ALKPHOS 47 04/22/2014 1417   AST 21 12/01/2015 0829   AST 18 04/22/2014 1417   ALT 24 12/01/2015 0829   ALT 22 04/22/2014 1417   BILITOT 0.68 12/01/2015 0829   BILITOT 0.8 04/22/2014 1417      RADIOGRAPHIC STUDIES: No results found.  ASSESSMENT AND PLAN: This is a very pleasant 75  years old white male with history of multiple myeloma status post induction chemotherapy with Velcade, Revlimid and dexamethasone  followed by treatment with Carfilzomib, cyclophosphamide and dexamethasone with significant improvement in his disease followed by autologous peripheral blood stem cell transplant at [facility] on 03/20/2013. This was followed by 2 cycles of consolidation chemotherapy with Carfilzomib, Cytoxan and dexamethasone, and he tolerated it fairly well. He completed maintenance Revlimid 10 mg by mouth daily for 3 months and tolerating it fairly well. He started maintenance Revlimid with 15 mg by mouth daily in May 2015 and tolerating it well except for the persistent neutropenia.  He started the reduced dose of Revlimid 10 mg by mouth daily on 02/19/2015. He is tolerating this treatment much better.  The recent myeloma panel showed no evidence for disease progression. I discussed the lab results and give reports to the patient today. I recommended for him to continue his current treatment with Revlimid 10 mg by mouth daily. He will continue treatment with Zometa every 3 months.  For the pulmonary embolism, he will continue on Xarelto 20 mg by mouth daily as scheduled.  For insomnia he will continue on Restoril. For the peripheral neuropathy, I started the patient on Neurontin 100 mg by mouth 3 times a day. I gave the patient referral for two-month supply of Ativan, Restoril and Percocet as well as Revlimid today. He would come back for follow-up visit in 2 months for reevaluation with repeat blood work after his return from [place]. He was advised to call immediately if he has any concerning symptoms in the interval. The patient voices understanding of current disease status and treatment options and is in agreement with the current care plan.  All questions were answered. The patient knows to call the clinic with any problems, questions or concerns. We can certainly see the  patient much sooner if necessary.  Disclaimer: This note was dictated with voice recognition software. Similar sounding words can inadvertently be transcribed and may not be corrected upon review.

## 2015-12-08 NOTE — Telephone Encounter (Signed)
Gave adn printed appt sched and avs for pt for July

## 2015-12-09 ENCOUNTER — Other Ambulatory Visit: Payer: Self-pay | Admitting: Family Medicine

## 2015-12-09 NOTE — Telephone Encounter (Signed)
Is this okay to refill? 

## 2015-12-22 ENCOUNTER — Other Ambulatory Visit: Payer: Self-pay | Admitting: Medical Oncology

## 2015-12-22 DIAGNOSIS — C9000 Multiple myeloma not having achieved remission: Secondary | ICD-10-CM

## 2015-12-22 MED ORDER — LENALIDOMIDE 10 MG PO CAPS: 10.0000 mg | ORAL_CAPSULE | Freq: Every day | ORAL | Status: DC

## 2015-12-22 NOTE — Progress Notes (Signed)
Faxed revlimid with new auth with #[id number] ( [place] going [place] ) to [facility]

## 2015-12-25 ENCOUNTER — Telehealth: Payer: Self-pay | Admitting: *Deleted

## 2015-12-25 NOTE — Telephone Encounter (Signed)
Called [facility] with request to d/c revlimid rx as this is to be filled by [facility]. Called [facility] who advised rx is been run by insurance and should be contacting pt by Monday. Requested pt be contacted today to set up delivery as he will be going out of town. Called pt advised above information, pt verbalized he will call today to speak with [facility] about delivery. No further concerns.

## 2016-01-05 ENCOUNTER — Other Ambulatory Visit: Payer: Self-pay | Admitting: Nurse Practitioner

## 2016-02-02 ENCOUNTER — Telehealth: Payer: Self-pay | Admitting: Internal Medicine

## 2016-02-02 NOTE — Telephone Encounter (Signed)
[facility] - moved 7/31 appointments to 8/3. Left message for patient and mailed schedule.

## 2016-02-05 ENCOUNTER — Other Ambulatory Visit: Payer: Medicare Other

## 2016-02-08 ENCOUNTER — Ambulatory Visit: Payer: Self-pay

## 2016-02-08 ENCOUNTER — Encounter: Payer: Self-pay | Admitting: Family Medicine

## 2016-02-08 ENCOUNTER — Other Ambulatory Visit: Payer: Self-pay

## 2016-02-08 ENCOUNTER — Ambulatory Visit: Payer: Self-pay | Admitting: Internal Medicine

## 2016-02-08 ENCOUNTER — Encounter: Payer: Medicare Other | Admitting: Family Medicine

## 2016-02-08 ENCOUNTER — Telehealth: Payer: Self-pay

## 2016-02-08 ENCOUNTER — Ambulatory Visit (INDEPENDENT_AMBULATORY_CARE_PROVIDER_SITE_OTHER): Payer: Medicare Other | Admitting: Family Medicine

## 2016-02-08 VITALS — BP 112/72 | HR 68 | Temp 98.1°F | Ht 69.5 in | Wt 218.4 lb

## 2016-02-08 DIAGNOSIS — T451X5A Adverse effect of antineoplastic and immunosuppressive drugs, initial encounter: Secondary | ICD-10-CM

## 2016-02-08 DIAGNOSIS — R600 Localized edema: Secondary | ICD-10-CM

## 2016-02-08 DIAGNOSIS — Z7901 Long term (current) use of anticoagulants: Secondary | ICD-10-CM

## 2016-02-08 DIAGNOSIS — G620 Drug-induced polyneuropathy: Secondary | ICD-10-CM

## 2016-02-08 DIAGNOSIS — C9000 Multiple myeloma not having achieved remission: Secondary | ICD-10-CM | POA: Diagnosis not present

## 2016-02-08 DIAGNOSIS — R609 Edema, unspecified: Secondary | ICD-10-CM | POA: Diagnosis not present

## 2016-02-08 DIAGNOSIS — M79605 Pain in left leg: Secondary | ICD-10-CM

## 2016-02-08 DIAGNOSIS — N486 Induration penis plastica: Secondary | ICD-10-CM

## 2016-02-08 DIAGNOSIS — R350 Frequency of micturition: Secondary | ICD-10-CM | POA: Diagnosis not present

## 2016-02-08 LAB — POCT URINALYSIS DIPSTICK
BILIRUBIN UA: NEGATIVE
Blood, UA: NEGATIVE
Glucose, UA: NEGATIVE
Ketones, UA: NEGATIVE
NITRITE UA: NEGATIVE
PH UA: 6
Protein, UA: NEGATIVE
Spec Grav, UA: 1.015
Urobilinogen, UA: NEGATIVE

## 2016-02-08 NOTE — Patient Instructions (Addendum)
Use antibacterial ointment to the wound on the left leg until healed. Keep the legs elevated (when sitting for prolonged periods) to help decrease the swelling.  Continue walking.  Compression socks/stockings will also help (use these in the future on long trips to prevent the swelling). Try and limit the sodium/salt in your diet.  You may use the furosemide if needed for worsening swelling, but be sure to take potassium rich foods (ie banana) or a potassium supplement along with it. I suspect the cramping you had was related to low potassium caused by the diuretic.  Contact us if you develop fever, increasing redness, swelling, streak up the leg or any drainage develops from the wound.

## 2016-02-08 NOTE — Telephone Encounter (Signed)
Placed in your folder for review. /RLB 

## 2016-02-08 NOTE — Telephone Encounter (Signed)
[facility] notes from 11/2015 received. Peyronie's symptoms only started 4 weeks prior to visit. F/u 6 mos for recheck. rx'd sildenafil for ED PSA was normal at 3.30.  Advised that it was slightly elevated, and it should be rechecked in 6 months when he returns for f/u  Per records, it was 3.09 in 01/2015.

## 2016-02-08 NOTE — Progress Notes (Signed)
Chief Complaint  Patient presents with  . Wound Infection    hit his left leg on a cruise 2 weeks ago, still hurting and sore. Feet are also swelling. Dr. Earlie Server gave him lasix '20mg'$ , and its causing muscle spams.   . Urinary Tract Infection    has been having urinary burning and frequency.    Left medial calf--hit it against something while on his trip 8 days ago, started bleeding.  It bled a lot but he was able to get it to stop.  He has a persistent sore and some redness, and he is worried about infection.  It has been tender since the injury 8 days ago. He reports that the redness has gradually been decreasing; pain has not been worsening.  Denies drainage, fever.  He had some swelling in his feet/ankles while on his vacation. He had diuretic from Dr. Earlie Server that he took, which helped with the swelling some, but caused spasms of his hands.  He reports his hands would get stuck in a fist, unable to open.  He also had a spasm in his chest area with flying.  He took robaxin while on the flight, which helped some. He stopped taking the lasix because it was causing spasms, and he felt like it was dehydrating him. Last dose of furosemide was 2 days ago (the day of his travel home). He has some persistent swelling in the feet, L>R ankle.  Denies calf pain, shortness of breath.  He continues on Xarelto (h/o DVT and PE). Other than with this recent calf injury, he denies any bleeding.  Patient is doing well with regards to Multiple Myeloma, on maintenance therapy with Revlimid and Zometa.  He last saw Dr. Earlie Server 12/08/15 and panel showed no evidence for disease progression. He was started on Neurontin '100mg'$  TID for peripheral neuropathy at his last visit. He "didn't like it" so stopped taking it. In his mind, it was due to the "seizure thing", doesn't recall a side effect. He only took 2 tablets. He has neuropathy from the Revlimid.  He reports numbness in the toes, and some burning at the ankles.  He  recently was diagnosed with Peyronie's--can't have intercourse due to the curvature.  It isn't painful, but mentally it is bothering him a lot. He saw Dr. Karsten Ro about 6 weeks ago, and is asking my opinion.  He also reports that his PSA was "a little elevated" and recommended it to be repeated.  He wants it repeated when he returns for his physical next month.  Records from the visit haven't been received.  URI symptoms since his trip--some ear plugging and nasal congestion, slight cough.  Using coricidin with good results. No purulent drainage, fevers, shortness of breath  PMH, PSH, SH reviewed.  Outpatient Encounter Prescriptions as of 02/08/2016  Medication Sig Note  . ascorbic acid (VITAMIN C) 500 MG tablet Take 500 mg by mouth 2 (two) times daily.    . cholecalciferol (VITAMIN D) 1000 UNITS tablet Take 1,000 Units by mouth daily.   Marland Kitchen lenalidomide (REVLIMID) 10 MG capsule Take 1 capsule (10 mg total) by mouth daily.   Marland Kitchen LORazepam (ATIVAN) 1 MG tablet Take 1 tablet (1 mg total) by mouth every 8 (eight) hours as needed for anxiety.   . methocarbamol (ROBAXIN) 500 MG tablet TAKE 1 TO 2 TABLETS BY MOUTH EVERY 6 HOURS AS NEEDED FOR MUSCLE SPASMS   . Multiple Vitamin (MULTIVITAMIN WITH MINERALS) TABS Take 1 tablet by mouth daily.   Marland Kitchen  pantoprazole (PROTONIX) 40 MG tablet Take 1 tablet by mouth two  times daily   . Probiotic Product (ALIGN) 4 MG CAPS Take 1 capsule by mouth daily. Reported on 09/10/2015   . tadalafil (CIALIS) 20 MG tablet Take 1 tablet (20 mg total) by mouth daily as needed for erectile dysfunction.   . temazepam (RESTORIL) 15 MG capsule Take 1 capsule (15 mg total) by mouth at bedtime as needed.   Alveda Reasons 20 MG TABS tablet Take 1 tablet by mouth  daily   . zolendronic acid (ZOMETA) 4 MG/5ML injection Inject 4 mg into the vein every 28 (twenty-eight) days. 09/10/2015: .  . dicyclomine (BENTYL) 10 MG capsule Take 1 capsule (10 mg total) by mouth 3 (three) times daily before meals.  (Patient taking differently: Take 10 mg by mouth 3 (three) times daily as needed for spasms. )   . fish oil-omega-3 fatty acids 1000 MG capsule Take 1 g by mouth daily.    . furosemide (LASIX) 20 MG tablet Take 1 tablet (20 mg total) by mouth daily. (Patient not taking: Reported on 02/08/2016)   . prochlorperazine (COMPAZINE) 10 MG tablet Take 1 tablet (10 mg total) by mouth every 6 (six) hours as needed. (Patient not taking: Reported on 02/08/2016) 07/01/2015: periodically  . [DISCONTINUED] bisacodyl (DULCOLAX) 5 MG EC tablet Take 5 mg by mouth daily as needed for moderate constipation.   . [DISCONTINUED] chlorpheniramine-HYDROcodone (TUSSIONEX) 10-8 MG/5ML SUER TK 1 TEA PO BID PRF COUGH 12/08/2015: Received from: External Pharmacy  . [DISCONTINUED] gabapentin (NEURONTIN) 100 MG capsule Take 1 capsule (100 mg total) by mouth 3 (three) times daily.   . [DISCONTINUED] methylPREDNISolone (MEDROL DOSEPAK) 4 MG TBPK tablet Use as instructed   . [DISCONTINUED] oseltamivir (TAMIFLU) 75 MG capsule Take 1 capsule (75 mg total) by mouth 2 (two) times daily.   . [DISCONTINUED] oxyCODONE-acetaminophen (PERCOCET/ROXICET) 5-325 MG tablet Take 1 tablet by mouth every 4 (four) hours as needed for severe pain.    Facility-Administered Encounter Medications as of 02/08/2016  Medication  . ondansetron (ZOFRAN) tablet 8 mg   And coricidin.  Allergies  Allergen Reactions  . Amoxicillin     REACTION: rash  . Penicillins Rash    Has patient had a PCN reaction causing immediate rash, facial/tongue/throat swelling, SOB or lightheadedness with hypotension: unknown Has patient had a PCN reaction causing severe rash involving mucus membranes or skin necrosis: unknown Has patient had a PCN reaction that required hospitalization: no  Has patient had a PCN reaction occurring within the last 10 years: no  If all of the above answers are "NO", then may proceed with Cephalosporin use.    ROS: no fever, chills, ear pain,  chest pain, shortness of breath, palpitations, nausea, vomiting, diarrhea, bleeding, bruising, rash, depression.  +curvature to penis which interferes with intercourse. +leg swelling as per HPI. Slight burning with urination.  Some frequency, but has been trying to drink more water to rehydrate from trip/travel/diuretics.  PHYSICAL EXAM: BP 112/72   Pulse 68   Temp 98.1 F (36.7 C) (Tympanic)   Ht 5' 9.5" (1.765 m)   Wt 218 lb 6.4 oz (99.1 kg)   BMI 31.79 kg/m  Well developed, well appearing male in no distress HEENT: PERRL conjunctiva and sclera are clear Heart: regular rate and rhythm without murmur Lungs: clear bilaterally Extremities:  Left medial calf-- 0.5 x 1 cm central scab 4 x 3.5 cm surrounding area of very minimal erythema (color is different from rest of skin,  not particularly red/pink) Trace edema bilaterally at ankles, slightly more on the left than the right, and a little more tender to palpation when pressing for edema on the left. Psych: normal mood, affect, hygiene and grooming Neuro: alert and oriented. Normal cranial nerves, strength, gait.   Urine dip:  1+ leuks, otherwise negative  ASSESSMENT/PLAN:  Leg pain, left - contusion, with healing wound. no evidence of cellulitis--s/sx reviewed  Urinary frequency - Plan: POCT Urinalysis Dipstick  Multiple myeloma not having achieved remission (HCC)  Peripheral edema - leg elevation, compression socks/stockings, lasix prn (with potassium)  Peripheral neuropathy due to chemotherapy ([place]) - discussed gabapentin in detail, will consider restarting to treat burning in ankles  Long term current use of anticoagulant therapy  Peyronie disease - discussed in more detail, and if very bothersome, f/u with Dr. Karsten Ro    ROR for [facility] to send records for my review prior to his next annual visit

## 2016-02-10 ENCOUNTER — Other Ambulatory Visit: Payer: Self-pay | Admitting: Medical Oncology

## 2016-02-10 ENCOUNTER — Telehealth: Payer: Self-pay | Admitting: Internal Medicine

## 2016-02-10 NOTE — Telephone Encounter (Signed)
spoke w/. pt confirmed 8/22 apt time

## 2016-02-11 ENCOUNTER — Telehealth: Payer: Self-pay | Admitting: *Deleted

## 2016-02-11 ENCOUNTER — Ambulatory Visit: Payer: Self-pay

## 2016-02-11 ENCOUNTER — Encounter: Payer: Self-pay | Admitting: Internal Medicine

## 2016-02-11 ENCOUNTER — Ambulatory Visit (HOSPITAL_BASED_OUTPATIENT_CLINIC_OR_DEPARTMENT_OTHER): Payer: Medicare Other | Admitting: Internal Medicine

## 2016-02-11 ENCOUNTER — Other Ambulatory Visit (HOSPITAL_BASED_OUTPATIENT_CLINIC_OR_DEPARTMENT_OTHER): Payer: Medicare Other

## 2016-02-11 ENCOUNTER — Telehealth: Payer: Self-pay | Admitting: Internal Medicine

## 2016-02-11 ENCOUNTER — Ambulatory Visit (HOSPITAL_BASED_OUTPATIENT_CLINIC_OR_DEPARTMENT_OTHER): Payer: Medicare Other

## 2016-02-11 VITALS — BP 127/73 | HR 61 | Temp 98.1°F | Resp 17 | Ht 69.5 in | Wt 217.5 lb

## 2016-02-11 DIAGNOSIS — C9000 Multiple myeloma not having achieved remission: Secondary | ICD-10-CM

## 2016-02-11 DIAGNOSIS — D709 Neutropenia, unspecified: Secondary | ICD-10-CM

## 2016-02-11 DIAGNOSIS — Z7901 Long term (current) use of anticoagulants: Secondary | ICD-10-CM

## 2016-02-11 DIAGNOSIS — G4700 Insomnia, unspecified: Secondary | ICD-10-CM

## 2016-02-11 DIAGNOSIS — G620 Drug-induced polyneuropathy: Secondary | ICD-10-CM | POA: Diagnosis not present

## 2016-02-11 DIAGNOSIS — I82441 Acute embolism and thrombosis of right tibial vein: Secondary | ICD-10-CM

## 2016-02-11 DIAGNOSIS — Z5111 Encounter for antineoplastic chemotherapy: Secondary | ICD-10-CM

## 2016-02-11 DIAGNOSIS — Z86711 Personal history of pulmonary embolism: Secondary | ICD-10-CM

## 2016-02-11 DIAGNOSIS — Z95828 Presence of other vascular implants and grafts: Secondary | ICD-10-CM

## 2016-02-11 LAB — CBC WITH DIFFERENTIAL/PLATELET
BASO%: 7.7 % — ABNORMAL HIGH (ref 0.0–2.0)
Basophils Absolute: 0.2 10*3/uL — ABNORMAL HIGH (ref 0.0–0.1)
EOS%: 9.5 % — AB (ref 0.0–7.0)
Eosinophils Absolute: 0.3 10*3/uL (ref 0.0–0.5)
HCT: 41.4 % (ref 38.4–49.9)
HEMOGLOBIN: 13.7 g/dL (ref 13.0–17.1)
LYMPH%: 23.7 % (ref 14.0–49.0)
MCH: 31.5 pg (ref 27.2–33.4)
MCHC: 33.1 g/dL (ref 32.0–36.0)
MCV: 95.1 fL (ref 79.3–98.0)
MONO#: 0.4 10*3/uL (ref 0.1–0.9)
MONO%: 13.9 % (ref 0.0–14.0)
NEUT%: 45.2 % (ref 39.0–75.0)
NEUTROS ABS: 1.4 10*3/uL — AB (ref 1.5–6.5)
Platelets: 161 10*3/uL (ref 140–400)
RBC: 4.35 10*6/uL (ref 4.20–5.82)
RDW: 15.2 % — AB (ref 11.0–14.6)
WBC: 3.1 10*3/uL — AB (ref 4.0–10.3)
lymph#: 0.7 10*3/uL — ABNORMAL LOW (ref 0.9–3.3)

## 2016-02-11 LAB — COMPREHENSIVE METABOLIC PANEL
ALBUMIN: 3.4 g/dL — AB (ref 3.5–5.0)
ALK PHOS: 63 U/L (ref 40–150)
ALT: 36 U/L (ref 0–55)
AST: 27 U/L (ref 5–34)
Anion Gap: 9 mEq/L (ref 3–11)
BILIRUBIN TOTAL: 0.6 mg/dL (ref 0.20–1.20)
BUN: 8 mg/dL (ref 7.0–26.0)
CO2: 24 meq/L (ref 22–29)
Calcium: 9.6 mg/dL (ref 8.4–10.4)
Chloride: 108 mEq/L (ref 98–109)
Creatinine: 1 mg/dL (ref 0.7–1.3)
EGFR: 77 mL/min/{1.73_m2} — ABNORMAL LOW (ref >90)
GLUCOSE: 89 mg/dL (ref 70–140)
POTASSIUM: 3.8 meq/L (ref 3.5–5.1)
SODIUM: 141 meq/L (ref 136–145)
TOTAL PROTEIN: 6.8 g/dL (ref 6.4–8.3)

## 2016-02-11 LAB — LACTATE DEHYDROGENASE: LDH: 217 U/L (ref 125–245)

## 2016-02-11 MED ORDER — LENALIDOMIDE 10 MG PO CAPS: 10.0000 mg | ORAL_CAPSULE | Freq: Every day | ORAL | 0 refills | Status: DC

## 2016-02-11 MED ORDER — HEPARIN SOD (PORK) LOCK FLUSH 100 UNIT/ML IV SOLN
500.0000 [IU] | Freq: Once | INTRAVENOUS | Status: AC
  Administered 2016-02-11: 500 [IU] via INTRAVENOUS
  Filled 2016-02-11: qty 5

## 2016-02-11 MED ORDER — SODIUM CHLORIDE 0.9% FLUSH
10.0000 mL | INTRAVENOUS | Status: DC | PRN
  Administered 2016-02-11: 10 mL via INTRAVENOUS
  Filled 2016-02-11: qty 10

## 2016-02-11 NOTE — Progress Notes (Signed)
[place] [phone]   [phone] [place], [place] [address]  DIAGNOSIS: Stage IIA multiple myeloma diagnosed in July of 2013.   PRIOR THERAPY:  1) Systemic chemotherapy with subcutaneous Velcade 1.3 mg/M2 on days 1, 4, 8 and 11 every 3 weeks in addition to Revlimid 25 mg by mouth daily for 14 days every 3 weeks and dexamethasone 40 mg on a weekly basis. Status post 4 planned cycles.  2) Systemic chemotherapy with subcutaneous Velcade 1.3 mg/M2 on days 1, 4, 8 and 11 every 3 weeks in addition to Revlimid 25 mg by mouth daily for 14 days every 3 weeks and dexamethasone 40 mg on a weekly basis. 4 more cycles are planned and he is status post 4 cycles of the second set of 4 cycles, now status post a total of 10 cycles. 3) Systemic chemotherapy with Carfilzomib 36 mg/M2 on days 1, 2, 8, 9, 15 and 16 in addition to cyclophosphamide 300 mg/M2 IV on days 1, 8 and 15 as well as dexamethasone on days 1, 8, 15 and 22 every 4 weeks. Status post 4 cycles. 4) Zometa 4 mg IV every month. 5) autologous peripheral blood stem cell transplant on 03/20/2013 at [facility]. 6) Consolidation chemotherapy with Carfilzomib 36 mg/M2 on days 1, 2, 8, 9, 15 and 16 every 4 weeks in addition to cyclophosphamide 300 mg/M2 on days 1, 8 and 15 as well as dexamethasone 40 mg on a weekly basis every 4 weeks. Status post 2 cycles. First cycle 06/11/2013. 7) Maintenance therapy with Revlimid 10 mg by mouth daily status post 3 months of treatment. 8) palliative radiotherapy to the lytic lesion in the skull under the care of Dr. Sondra Come 9) Maintenance therapy with Revlimid 15 mg by mouth daily started 11/09/2013 and discontinued 02/18/2015 secondary to persistent neutropenia.   CURRENT THERAPY:  1) Maintenance therapy with Revlimid 10 mg by mouth daily started 02/19/2015. 2) Zometa 4 mg IV every 3 months. 3) Xarelto 20 mg by  mouth daily for pulmonary embolism.   INTERVAL HISTORY: [name] 75 y.o. male returns to the clinic today for routine monthly followup visit. The patient is feeling fine today with no specific complaints. He just came back from a long trip to [place]. He has no concerning complaints during his overseas travel except for mild swelling in his lower extremities from walking too much and eating salty food. He denied having any significant fatigue or weakness. He denied having any fever or chills. He has no nausea or vomiting.  He denied having any significant chest pain, shortness of breath, cough or hemoptysis. He has no significant weight loss or night sweats. He is here today for evaluation and repeat blood work.  MEDICAL HISTORY: Past Medical History:  Diagnosis Date  . Adenomatous polyp of colon 1996  . Arthritis    Osteoarthritis right knee, spine, wrist  . Arthritis of hand, right    Right thumb (Dr. Daylene Katayama)  . Back ache    r/o Multiple Myeloma  . Benign prostatic hypertrophy    (Dr. Karsten Ro in past)  . Degenerative disc disease   . Diverticulosis   . DVT (deep venous thrombosis) ([place]) 01/2014   R calf  . Encounter for antineoplastic chemotherapy 10/20/2015  . Erosive esophagitis   . GERD (gastroesophageal reflux disease)   . Hemorrhoids   . Hiatal hernia   . History of chronic  prostatitis   . History of melanoma    Back (Dr. Wilhemina Bonito); early melanoma R arm 03/2011  . Multiple myeloma ([place]) 2013   skull lytic lesions--treated with radiation 07/2014  . Pulmonary embolism ([place]) 01/2014  . Radiation 07/21/14-08/05/14   left occipital parietal skull 25 gray  . SCC (squamous cell carcinoma) 08/04/15   right ear;dysplastic nevus with severe atypia-chest    ALLERGIES:  is allergic to amoxicillin and penicillins.  MEDICATIONS:  Current Outpatient Prescriptions  Medication Sig Dispense Refill  . ascorbic acid (VITAMIN C) 500 MG tablet Take 500 mg by mouth 2 (two) times daily.      . cholecalciferol (VITAMIN D) 1000 UNITS tablet Take 1,000 Units by mouth daily.    Marland Kitchen dicyclomine (BENTYL) 10 MG capsule Take 1 capsule (10 mg total) by mouth 3 (three) times daily before meals. (Patient taking differently: Take 10 mg by mouth 3 (three) times daily as needed for spasms. ) 60 capsule 0  . fish oil-omega-3 fatty acids 1000 MG capsule Take 1 g by mouth daily.     Marland Kitchen lenalidomide (REVLIMID) 10 MG capsule Take 1 capsule (10 mg total) by mouth daily. 56 capsule 0  . LORazepam (ATIVAN) 1 MG tablet Take 1 tablet (1 mg total) by mouth every 8 (eight) hours as needed for anxiety. 30 tablet 0  . methocarbamol (ROBAXIN) 500 MG tablet TAKE 1 TO 2 TABLETS BY MOUTH EVERY 6 HOURS AS NEEDED FOR MUSCLE SPASMS 60 tablet 0  . Multiple Vitamin (MULTIVITAMIN WITH MINERALS) TABS Take 1 tablet by mouth daily.    Marland Kitchen oxyCODONE-acetaminophen (PERCOCET/ROXICET) 5-325 MG tablet Take 1 tablet by mouth every 4 (four) hours as needed.  0  . pantoprazole (PROTONIX) 40 MG tablet Take 1 tablet by mouth two  times daily 180 tablet 1  . Probiotic Product (ALIGN) 4 MG CAPS Take 1 capsule by mouth daily. Reported on 09/10/2015    . prochlorperazine (COMPAZINE) 10 MG tablet Take 1 tablet (10 mg total) by mouth every 6 (six) hours as needed. 30 tablet 1  . temazepam (RESTORIL) 15 MG capsule Take 1 capsule (15 mg total) by mouth at bedtime as needed. 60 capsule 0  . XARELTO 20 MG TABS tablet Take 1 tablet by mouth  daily 90 tablet 0  . zolendronic acid (ZOMETA) 4 MG/5ML injection Inject 4 mg into the vein every 28 (twenty-eight) days.    . furosemide (LASIX) 20 MG tablet Take 1 tablet (20 mg total) by mouth daily. (Patient not taking: Reported on 02/08/2016) 20 tablet 0  . tadalafil (CIALIS) 20 MG tablet Take 1 tablet (20 mg total) by mouth daily as needed for erectile dysfunction. 30 tablet 5   No current facility-administered medications for this visit.    Facility-Administered Medications Ordered in Other Visits    Medication Dose Route Frequency Provider Last Rate Last Dose  . ondansetron (ZOFRAN) tablet 8 mg  8 mg Oral Once Curt Bears, MD        SURGICAL HISTORY:  Past Surgical History:  Procedure Laterality Date  . APPENDECTOMY    . BONE MARROW TRANSPLANT  03/2013   Stem Cell  . COLONOSCOPY  2009   adenomatous polyp  . ESOPHAGOGASTRODUODENOSCOPY  2009   mild inflammation at esophagogastric junction  . excision of melanoma     back ( x3)  . Great toe surgery     bilateral  . INGUINAL HERNIA REPAIR     left, x 2  . port placemet  10/2012  .  TOTAL KNEE ARTHROPLASTY  06/08/2011   Procedure: TOTAL KNEE ARTHROPLASTY;  Surgeon: Loreta Ave, MD;  Location: [facility];  Service: Orthopedics;  Laterality: Right;  osteonics  . total knee replaced  2000   Left knee    REVIEW OF SYSTEMS:  Constitutional: negative Eyes: negative Ears, nose, mouth, throat, and face: negative Respiratory: negative Cardiovascular: negative Gastrointestinal: negative Genitourinary:negative Integument/breast: negative Hematologic/lymphatic: negative Musculoskeletal:negative Neurological: negative Behavioral/Psych: negative Endocrine: negative Allergic/Immunologic: negative   PHYSICAL EXAMINATION: General appearance: alert, cooperative and no distress Head: Normocephalic, without obvious abnormality, atraumatic, Ecchymosis at the right frontal area of the face. Neck: no adenopathy, no JVD, supple, symmetrical, trachea midline and thyroid not enlarged, symmetric, no tenderness/mass/nodules Lymph nodes: Cervical, supraclavicular, and axillary nodes normal. Resp: clear to auscultation bilaterally Back: symmetric, no curvature. ROM normal. No CVA tenderness. Cardio: regular rate and rhythm, S1, S2 normal, no murmur, click, rub or gallop GI: soft, non-tender; bowel sounds normal; no masses,  no organomegaly Extremities: extremities normal, atraumatic, no cyanosis or edema Neurologic: Alert and oriented X 3,  normal strength and tone. Normal symmetric reflexes. Normal coordination and gait  ECOG PERFORMANCE STATUS: 1 - Symptomatic but completely ambulatory  Blood pressure 127/73, pulse 61, temperature 98.1 F (36.7 C), temperature source Oral, resp. rate 17, height 5' 9.5" (1.765 m), weight 217 lb 8 oz (98.7 kg), SpO2 97 %.  LABORATORY DATA: Lab Results  Component Value Date   WBC 3.1 (L) 02/11/2016   HGB 13.7 02/11/2016   HCT 41.4 02/11/2016   MCV 95.1 02/11/2016   PLT 161 02/11/2016      Chemistry      Component Value Date/Time   NA 141 02/11/2016 0849   K 3.8 02/11/2016 0849   CL 108 09/10/2015 0954   CL 109 (H) 12/31/2012 0759   CO2 24 02/11/2016 0849   BUN 8.0 02/11/2016 0849   CREATININE 1.0 02/11/2016 0849      Component Value Date/Time   CALCIUM 9.6 02/11/2016 0849   ALKPHOS 63 02/11/2016 0849   AST 27 02/11/2016 0849   ALT 36 02/11/2016 0849   BILITOT 0.60 02/11/2016 0849      RADIOGRAPHIC STUDIES: No results found.  ASSESSMENT AND PLAN: This is a very pleasant 75 years old white male with history of multiple myeloma status post induction chemotherapy with Velcade, Revlimid and dexamethasone followed by treatment with Carfilzomib, cyclophosphamide and dexamethasone with significant improvement in his disease followed by autologous peripheral blood stem cell transplant at [facility] on 03/20/2013. This was followed by 2 cycles of consolidation chemotherapy with Carfilzomib, Cytoxan and dexamethasone, and he tolerated it fairly well. He completed maintenance Revlimid 10 mg by mouth daily for 3 months and tolerating it fairly well. He started maintenance Revlimid with 15 mg by mouth daily in May 2015 and tolerating it well except for the persistent neutropenia.  He started the reduced dose of Revlimid 10 mg by mouth daily on 02/19/2015. He is tolerating this treatment much better.  His blood work today is unremarkable except for mild neutropenia. I recommended for  him to continue his current treatment with Revlimid 10 mg by mouth daily. He will continue treatment with Zometa every 3 months.  For the pulmonary embolism, he will continue on Xarelto 20 mg by mouth daily as scheduled.  For insomnia he will continue on Restoril. For the peripheral neuropathy, I started the patient on Neurontin 100 mg by mouth 3 times a day. I will refill his Revlimid today. He would  come back for follow-up visit in 6 weeks for reevaluation with repeat myeloma panel. He was advised to call immediately if he has any concerning symptoms in the interval. The patient voices understanding of current disease status and treatment options and is in agreement with the current care plan.  All questions were answered. The patient knows to call the clinic with any problems, questions or concerns. We can certainly see the patient much sooner if necessary.  Disclaimer: This note was dictated with voice recognition software. Similar sounding words can inadvertently be transcribed and may not be corrected upon review.

## 2016-02-11 NOTE — Progress Notes (Signed)
Pt reported to flush room to have labs drawn from port. Port flushes without resistance. Minimal blood return. Pt noted this happens once in a while and they have to use that special medication. Pt escorted back to lab for peripheral lab drawn. Dr [name] made aware.

## 2016-02-11 NOTE — Telephone Encounter (Signed)
VM message received from patient regarding his revlimid refill. Apparently it was sent to his local [facility], though it needs to go to [facility]. Necessary changes done and prescription escribed to [facility]. Pt requesting 28 tablets , not 56.  TC back to patient and made him aware of this refill correction.

## 2016-02-11 NOTE — Telephone Encounter (Signed)
spoke w/ pt confirmed sept apt times

## 2016-02-12 ENCOUNTER — Telehealth: Payer: Self-pay | Admitting: Internal Medicine

## 2016-02-12 NOTE — Telephone Encounter (Signed)
Patient called to reschedule 03/22/16 appt to 03/29/16. Appointment scheduled. Merleen Nicely.

## 2016-02-15 ENCOUNTER — Other Ambulatory Visit: Payer: Self-pay | Admitting: Internal Medicine

## 2016-02-24 ENCOUNTER — Other Ambulatory Visit: Payer: Medicare Other

## 2016-02-24 DIAGNOSIS — Z79899 Other long term (current) drug therapy: Secondary | ICD-10-CM | POA: Diagnosis not present

## 2016-02-24 DIAGNOSIS — Z125 Encounter for screening for malignant neoplasm of prostate: Secondary | ICD-10-CM | POA: Diagnosis not present

## 2016-02-24 DIAGNOSIS — Z0000 Encounter for general adult medical examination without abnormal findings: Secondary | ICD-10-CM

## 2016-02-25 LAB — GLUCOSE, RANDOM: Glucose, Bld: 91 mg/dL (ref 65–99)

## 2016-02-25 LAB — TSH: TSH: 1.7 m[IU]/L (ref 0.40–4.50)

## 2016-02-25 LAB — PSA: PSA: 3.6 ng/mL (ref <=4.0)

## 2016-02-26 ENCOUNTER — Other Ambulatory Visit: Payer: Medicare Other

## 2016-02-28 NOTE — Progress Notes (Signed)
Chief Complaint  Patient presents with  . Medicare Wellness    nonfasting annual exam/AMV. Did not do exam, said he sees his own eye doctor.    [name] is a 75 y.o. male who presents for annual physical, Medicare wellness visit and follow-up on chronic medical conditions.  He has no complaints today.  Wound at L medial calf (injured on his trip last month) has completely healed. F/u edema--seen last month with complaint of LE swelling after traveling. Swelling resolved, but returned with another long trip (flying to [facility] to drop off granddaughter at college).  He is requesting refill on his furosemide.  He tried a compression sock, but it was too tight ("cut off the circulation")--his daughter is getting him a [employer] brand one to try.  Multiple Myeloma:  Under the care of Dr. [name], stable.  He is on maintenance therapy with Revlimid 10 mg daily and Zometa 4 mg IV every 3 months.  He is s/p stem cell transplant, and has completed the follow-up immunization regimen. He checked with [facility] and was told he should not have Zostavax again.  He continues to have some scalp pain, uses hydrocodone prn (about every 3 days) for this pain. He was started on Neurontin 164m TID for peripheral neuropathy at his last visit with Dr. MJulien [name] He "didn't like it" so stopped taking it. In his mind, it was due to the "seizure thing", doesn't recall a side effect. He took only a few tablets, never regularly. He has neuropathy from the Revlimid.  He reports numbness in the toes, and some burning at the ankles. This isn't particularly bothersome to him.  He recently was diagnosed with Peyronie's by Dr. OKarsten Ro  He can't have intercourse due to the curvature.  It isn't painful (just periodically), but mentally it is bothering him a lot. At his last visit, he also mentioned that his PSA was "a little elevated" and recommended to be repeated.  He expressed the desire to have it repeated when he returned for his  physical.  Since then, records were received.  His PSA in 11/2015 was 3.3. It was recommended to be rechecked in 6 months at his next follow-up. Unfortunately, future orders for this physical had been placed last year, and so was repeated with his labs done prior to today's visit.  H/o DVT and PE:  He continues on Xarelto 20 mg daily.  He denies any bleeding or complications.  He last saw Dr. SFuller Planin 03/2015.  He has GERD with h/o erosive esophagitis and is maintained on pantoprazole 40 mg twice daily. He suffer from intermittent LLQ pain, felt to be IBS, and uses dicyclomine prn with good results.  H/o  adenomatous colon polyps. He is past due for polyp surveillance. He has declined colonoscopy (due to concern of stopping anticoagulation). Given his history of adenomatous polyps polyps he is not appropriate candidate for Cologuard. He had air contrast barium enema in 04/2015 which was normal (mild diverticulosis, no polyps seen).  Immunization History  Administered Date(s) Administered  . DTaP 06/04/2014  . DTaP / Hep B / IPV 03/04/2014, 06/04/2014, 08/06/2014  . HiB (PRP-OMP) 03/04/2014, 06/04/2014, 08/06/2014  . Influenza Split 04/05/2011, 04/16/2012  . Influenza,inj,Quad PF,36+ Mos 04/16/2014, 03/25/2015  . Pneumococcal Conjugate-13 03/04/2014, 06/04/2014, 08/06/2014  . Pneumococcal Polysaccharide-23 12/09/2005, 09/02/2010  . Td 06/11/2003  . Tdap 01/05/2012  . Zoster 09/02/2010   Last colonoscopy:  Dr. SFuller Plan2009 adenomatous and hyperplastic polyps as well as diverticulosis.  [facility], community  contrast BE done 04/2015 Last PSA: 3.3 in 11/2015 (Dr. Karsten Ro) Dentist: twice yearly Ophtho: yearly  Exercise:  Walks an hour every day, plus works with trainer at the [employer] 2x/week   Other doctors caring for patient include: Oncologist: Dr. Earlie Server Oncologist at [facility]: Dr. Ok Edwards (did stem cell transplant) GI: Dr. Fuller Plan Ophtho: Dr. Sherlean Foot Dentist: Dr. Philipp Ovens Dermatologist: Dr. Wilhemina Bonito Podiatrist:  Dr. Paulla Dolly Urologist: Dr. Karsten Ro  Depression, functional screen and fall screen are all documented in epic and entirely negative  End of Life Discussion:  Patient has a living will and medical power of attorney  Past Medical History:  Diagnosis Date  . Adenomatous polyp of colon 1996  . Arthritis    Osteoarthritis right knee, spine, wrist  . Arthritis of hand, right    Right thumb (Dr. Daylene Katayama)  . Back ache    r/o Multiple Myeloma  . Benign prostatic hypertrophy    (Dr. Karsten Ro in past)  . Degenerative disc disease   . Diverticulosis   . DVT (deep venous thrombosis) ([place]) 01/2014   R calf  . Encounter for antineoplastic chemotherapy 10/20/2015  . Erosive esophagitis   . GERD (gastroesophageal reflux disease)   . Hemorrhoids   . Hiatal hernia   . History of chronic prostatitis   . History of melanoma    Back (Dr. Wilhemina Bonito); early melanoma R arm 03/2011  . Multiple myeloma ([place]) 2013   skull lytic lesions--treated with radiation 07/2014  . Pulmonary embolism ([place]) 01/2014  . Radiation 07/21/14-08/05/14   left occipital parietal skull 25 gray  . SCC (squamous cell carcinoma) 08/04/15   right ear;dysplastic nevus with severe atypia-chest    Past Surgical History:  Procedure Laterality Date  . APPENDECTOMY    . BONE MARROW TRANSPLANT  03/2013   Stem Cell  . COLONOSCOPY  2009   adenomatous polyp  . ESOPHAGOGASTRODUODENOSCOPY  2009   mild inflammation at esophagogastric junction  . excision of melanoma     back ( x3)  . Great toe surgery     bilateral  . INGUINAL HERNIA REPAIR     left, x 2  . port placemet  10/2012  . TOTAL KNEE ARTHROPLASTY  06/08/2011   Procedure: TOTAL KNEE ARTHROPLASTY;  Surgeon: Ninetta Lights, MD;  Location: [facility];  Service: Orthopedics;  Laterality: Right;  osteonics  . total knee replaced  2000   Left knee    Social History   Social History  . Marital status: Married    Spouse name: N/A  . Number of children: 2  .  Years of education: N/A   Occupational History  . 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 Smoking status: Former Smoker    Packs/day: 3.00    Years: 10.00    Types: Cigarettes    Quit date: 07/11/1976  . Smokeless tobacco: Never Used     Comment: 3 PPD x 6 years  . Alcohol use 1.5 oz/week    3 Standard drinks or equivalent per week     Comment: 2 glasses of wine daily  . Drug use: No  . Sexual activity: Yes    Partners: Female   Other Topics Concern  . Not on file   Social History Narrative   Married.  2 daughters, both in [place], 4 granddaughters    Family History  Problem Relation Age of Onset  . Dementia Mother   . Stroke Father  brainstem stroke  . Hypertension Father   . Diabetes Father   . Prostate cancer Maternal Grandfather   . Depression Daughter   . Anesthesia problems Neg Hx     Outpatient Encounter Prescriptions as of 02/29/2016  Medication Sig Note  . ascorbic acid (VITAMIN C) 500 MG tablet Take 500 mg by mouth 2 (two) times daily.    . cholecalciferol (VITAMIN D) 1000 UNITS tablet Take 1,000 Units by mouth daily.   . fish oil-omega-3 fatty acids 1000 MG capsule Take 1 g by mouth daily.    Marland Kitchen lenalidomide (REVLIMID) 10 MG capsule Take 1 capsule (10 mg total) by mouth daily.   Marland Kitchen LORazepam (ATIVAN) 1 MG tablet Take 1 tablet (1 mg total) by mouth every 8 (eight) hours as needed for anxiety.   . Multiple Vitamin (MULTIVITAMIN WITH MINERALS) TABS Take 1 tablet by mouth daily.   . pantoprazole (PROTONIX) 40 MG tablet Take 1 tablet by mouth two  times daily   . Probiotic Product (ALIGN) 4 MG CAPS Take 1 capsule by mouth daily. Reported on 09/10/2015   . temazepam (RESTORIL) 15 MG capsule Take 1 capsule (15 mg total) by mouth at bedtime as needed.   Alveda Reasons 20 MG TABS tablet Take 1 tablet by mouth  daily   . zolendronic acid (ZOMETA) 4 MG/5ML injection Inject 4 mg into the vein every 28 (twenty-eight) days.  09/10/2015: .  Marland Kitchen [DISCONTINUED] tadalafil (CIALIS) 20 MG tablet Take 1 tablet (20 mg total) by mouth daily as needed for erectile dysfunction.   . dicyclomine (BENTYL) 10 MG capsule Take 1 capsule (10 mg total) by mouth 3 (three) times daily before meals. (Patient not taking: Reported on 02/29/2016)   . furosemide (LASIX) 20 MG tablet Take 1 tablet (20 mg total) by mouth daily. (Patient not taking: Reported on 02/08/2016)   . methocarbamol (ROBAXIN) 500 MG tablet TAKE 1 TO 2 TABLETS BY MOUTH EVERY 6 HOURS AS NEEDED FOR MUSCLE SPASMS (Patient not taking: Reported on 02/29/2016)   . oxyCODONE-acetaminophen (PERCOCET/ROXICET) 5-325 MG tablet Take 1 tablet by mouth every 4 (four) hours as needed. 02/11/2016: Received from: External Pharmacy Received Sig: TK 1 T PO  Q 4 H PRF SEVERE PAIN.  Marland Kitchen prochlorperazine (COMPAZINE) 10 MG tablet Take 1 tablet (10 mg total) by mouth every 6 (six) hours as needed. (Patient not taking: Reported on 02/29/2016) 07/01/2015: periodically   Facility-Administered Encounter Medications as of 02/29/2016  Medication  . ondansetron (ZOFRAN) tablet 8 mg    Allergies  Allergen Reactions  . Amoxicillin     REACTION: rash  . Penicillins Rash    Has patient had a PCN reaction causing immediate rash, facial/tongue/throat swelling, SOB or lightheadedness with hypotension: unknown Has patient had a PCN reaction causing severe rash involving mucus membranes or skin necrosis: unknown Has patient had a PCN reaction that required hospitalization: no  Has patient had a PCN reaction occurring within the last 10 years: no  If all of the above answers are "NO", then may proceed with Cephalosporin use.     ROS:  The patient denies anorexia, fever, weight changes, headaches (just the skull pain related to MM bony lesions),  vision loss, decreased hearing, ear pain, hoarseness, chest pain, palpitations, dizziness, syncope, dyspnea on exertion, cough, swelling, nausea, vomiting, constipation,   melena, hematochezia, indigestion/heartburn, hematuria, incontinence, nocturia, weakened urine stream, dysuria, genital lesions, joint pains, numbness, tingling, weakness, tremor, suspicious skin lesions, depression, anxiety, abnormal bleeding/bruising, or enlarged lymph nodes.  +LLQ  pain intermittently. Intermittent hand spasms. No joint pain. Some easy bruising (mainly on his hands) from blood thinners. Intermittent insomnia (uses restoril prn a couple of times/week, usually when he takes pain medication for scalp pain). Numbness in toes, burning at ankles (neuropathy from chemo). +ED and difficulty with intercourse due to curvature from Peyronie's   PHYSICAL EXAM:  BP 108/64   Pulse 64   Ht 5' 9.25" (1.759 m)   Wt 216 lb 12.8 oz (98.3 kg)   BMI 31.79 kg/m    General Appearance:   Alert, cooperative, no distress, appears stated age  Head:   Normocephalic, without obvious abnormality, atraumatic  Eyes:   PERRL, conjunctiva/corneas clear, EOM's intact, fundi   benign  Ears:   Normal TM's and external ear canals  Nose:  Nares normal, mucosa normal, no drainage or sinus tenderness  Throat:  Lips, mucosa, and tongue normal; teeth and gums normal  Neck:  Supple, no lymphadenopathy; thyroid: no enlargement/tenderness/nodules; no carotid  bruit or JVD  Back:  Spine nontender, no curvature, ROM normal, no CVAtenderness.  Lungs:   Clear to auscultation bilaterally without wheezes, rales or ronchi; respirations unlabored  Chest Wall:   No tenderness. Has deformity of inferior ribs on L (h/o traumatic injury)  Heart:   Regular rate and rhythm, S1 and S2 normal, no murmur, rub  or gallop. Occasional skipped beat  Breast Exam:   No chest wall tenderness, masses or gynecomastia  Abdomen:   Soft, non-tender, nondistended, normoactive bowel sounds,   no masses, no hepatosplenomegaly.    Genitalia:   deferred to urologist.  Rectal:   deferred to urologist  Extremities:  No clubbing, cyanosis or edema  Pulses:  2+ and symmetric all extremities  Skin:  Skin color, texture, turgor normal, no rashes or lesions. Many angiomas and other benign lesions. Some bruising and purpura on hands  Lymph nodes:  Cervical, supraclavicular, and axillary nodes normal  Neurologic:  CNII-XII intact, normal strength, sensation and gait; reflexes 2+ and symmetric throughout  Psych: Normal mood, affect, hygiene and grooming   Lab Results  Component Value Date   TSH 1.70 02/24/2016   Lab Results  Component Value Date   PSA 3.6 02/24/2016   PSA 3.09 02/04/2015   PSA 1.74 01/05/2012   Fasting glucose 91 (02/24/16)    ASSESSMENT/PLAN:  Annual physical exam - Plan: POCT Urinalysis Dipstick  Multiple myeloma not having achieved remission ([place]) - stable on current regimen per oncology; continue  Long term current use of anticoagulant therapy  Peripheral neuropathy due to chemotherapy ([place]) - symptoms are mild/tolerable, and he isn't interested in medications (never really took gabapentin, just took for a few days)  Senile purpura ([place])  Erectile dysfunction, unspecified erectile dysfunction type  Medicare annual wellness visit, subsequent  Peripheral edema - Plan: furosemide (LASIX) 20 MG tablet   I recommend pneumovax booster (has been 5 years from the last, which was prior to his transplant)--he will ask [facility] and return for [place] if they are in agreement.  He will get high dose flu shot either here or at oncologist (if available for his appt next week).  Recommended at least 30 minutes of aerobic activity at least 5 days/week, weight-bearing exercising 2x/week; proper sunscreen use reviewed; healthy diet and alcohol recommendations (less than or equal to 2 drinks/day) reviewed; regular seatbelt use; changing batteries in smoke detectors. Self-testicular exams. Immunization recommendations  discussed--yearly high dose flu shots in the Fall not yet available in our clinic); will check  with docs at [facility] about pneumovax booster (I recommend).. Colonoscopy recommendations reviewed--screening UTD, had BE last year (in place of colonoscopy, which would require him off anticoagulation), per Dr. Fuller Plan. Prostate cancer screening and PSA from Dr. Karsten Ro  Full Code, Full Care  Medicare Attestation I have personally reviewed: The patient's medical and social history Their use of alcohol, tobacco or illicit drugs Their current medications and supplements The patient's functional ability including ADLs,fall risks, home safety risks, cognitive, and hearing and visual impairment Diet and physical activities Evidence for depression or mood disorders  The patient's weight, height, and BMI have been recorded in the chart.  I have made referrals, counseling, and provided education to the patient based on review of the above and I have provided the patient with a written personalized care plan for preventive services.     Daivik Overley A, MD   02/28/2016

## 2016-02-29 ENCOUNTER — Encounter: Payer: Self-pay | Admitting: Family Medicine

## 2016-02-29 ENCOUNTER — Ambulatory Visit (INDEPENDENT_AMBULATORY_CARE_PROVIDER_SITE_OTHER): Payer: Medicare Other | Admitting: Family Medicine

## 2016-02-29 VITALS — BP 108/64 | HR 64 | Ht 69.25 in | Wt 216.8 lb

## 2016-02-29 DIAGNOSIS — R600 Localized edema: Secondary | ICD-10-CM

## 2016-02-29 DIAGNOSIS — D692 Other nonthrombocytopenic purpura: Secondary | ICD-10-CM | POA: Diagnosis not present

## 2016-02-29 DIAGNOSIS — T451X5A Adverse effect of antineoplastic and immunosuppressive drugs, initial encounter: Secondary | ICD-10-CM

## 2016-02-29 DIAGNOSIS — Z0000 Encounter for general adult medical examination without abnormal findings: Secondary | ICD-10-CM | POA: Diagnosis not present

## 2016-02-29 DIAGNOSIS — R609 Edema, unspecified: Secondary | ICD-10-CM | POA: Diagnosis not present

## 2016-02-29 DIAGNOSIS — G620 Drug-induced polyneuropathy: Secondary | ICD-10-CM | POA: Diagnosis not present

## 2016-02-29 DIAGNOSIS — Z7901 Long term (current) use of anticoagulants: Secondary | ICD-10-CM | POA: Diagnosis not present

## 2016-02-29 DIAGNOSIS — N529 Male erectile dysfunction, unspecified: Secondary | ICD-10-CM

## 2016-02-29 DIAGNOSIS — C9000 Multiple myeloma not having achieved remission: Secondary | ICD-10-CM | POA: Diagnosis not present

## 2016-02-29 LAB — POCT URINALYSIS DIPSTICK
BILIRUBIN UA: NEGATIVE
GLUCOSE UA: NEGATIVE
Ketones, UA: NEGATIVE
LEUKOCYTES UA: NEGATIVE
NITRITE UA: NEGATIVE
Protein, UA: NEGATIVE
Spec Grav, UA: 1.02
Urobilinogen, UA: NEGATIVE
pH, UA: 6

## 2016-02-29 MED ORDER — FUROSEMIDE 20 MG PO TABS: 20.0000 mg | ORAL_TABLET | Freq: Every day | ORAL | 0 refills | Status: AC

## 2016-02-29 NOTE — Patient Instructions (Signed)
  HEALTH MAINTENANCE RECOMMENDATIONS:  It is recommended that you get at least 30 minutes of aerobic exercise at least 5 days/week (for weight loss, you may need as much as 60-90 minutes). This can be any activity that gets your heart rate up. This can be divided in 10-15 minute intervals if needed, but try and build up your endurance at least once a week.  Weight bearing exercise is also recommended twice weekly.  Eat a healthy diet with lots of vegetables, fruits and fiber.  "Colorful" foods have a lot of vitamins (ie green vegetables, tomatoes, red peppers, etc).  Limit sweet tea, regular sodas and alcoholic beverages, all of which has a lot of calories and sugar.  Up to 2 alcoholic drinks daily may be beneficial for men (unless trying to lose weight, watch sugars).  Drink a lot of water.  Sunscreen of at least SPF 30 should be used on all sun-exposed parts of the skin when outside between the hours of 10 am and 4 pm (not just when at beach or pool, but even with exercise, golf, tennis, and yard work!)  Use a sunscreen that says "broad spectrum" so it covers both UVA and UVB rays, and make sure to reapply every 1-2 hours.  Remember to change the batteries in your smoke detectors when changing your clock times in the spring and fall.  Use your seat belt every time you are in a car, and please drive safely and not be distracted with cell phones and texting while driving.   Mr. Ellithorpe , Thank you for taking time to come for your Medicare Wellness Visit. I appreciate your ongoing commitment to your health goals. Please review the following plan we discussed and let me know if I can assist you in the future.   These are the goals we discussed: Goals    None      This is a list of the screening recommended for you and due dates:  Health Maintenance  Topic Date Due  . Flu Shot  03/13/2016*  . Colon Cancer Screening  12/01/2022*  . Tetanus Vaccine  01/04/2022  . Shingles Vaccine  Completed  .  Pneumonia vaccines  Completed  *Topic was postponed. The date shown is not the original due date.   Check with [facility]. Ok Edwards at [facility] to see you if need a pneumovax booster (your last was given in 2012, prior to your transplant).

## 2016-03-01 ENCOUNTER — Ambulatory Visit (HOSPITAL_BASED_OUTPATIENT_CLINIC_OR_DEPARTMENT_OTHER): Payer: Medicare Other

## 2016-03-01 ENCOUNTER — Encounter: Payer: Self-pay | Admitting: Family Medicine

## 2016-03-01 VITALS — BP 112/62 | HR 58 | Temp 98.6°F | Resp 18

## 2016-03-01 DIAGNOSIS — Z452 Encounter for adjustment and management of vascular access device: Secondary | ICD-10-CM

## 2016-03-01 DIAGNOSIS — C9000 Multiple myeloma not having achieved remission: Secondary | ICD-10-CM | POA: Diagnosis not present

## 2016-03-01 MED ORDER — ZOLEDRONIC ACID 4 MG/100ML IV SOLN
4.0000 mg | Freq: Once | INTRAVENOUS | Status: AC
  Administered 2016-03-01: 4 mg via INTRAVENOUS
  Filled 2016-03-01: qty 100

## 2016-03-01 MED ORDER — ALTEPLASE 2 MG IJ SOLR
2.0000 mg | Freq: Once | INTRAMUSCULAR | Status: AC | PRN
  Administered 2016-03-01: 2 mg
  Filled 2016-03-01: qty 2

## 2016-03-01 MED ORDER — HEPARIN SOD (PORK) LOCK FLUSH 100 UNIT/ML IV SOLN
250.0000 [IU] | Freq: Once | INTRAVENOUS | Status: AC | PRN
  Administered 2016-03-01: 250 [IU]
  Filled 2016-03-01: qty 5

## 2016-03-01 MED ORDER — HEPARIN SOD (PORK) LOCK FLUSH 100 UNIT/ML IV SOLN
500.0000 [IU] | Freq: Once | INTRAVENOUS | Status: AC | PRN
  Administered 2016-03-01: 500 [IU]
  Filled 2016-03-01: qty 5

## 2016-03-01 MED ORDER — SODIUM CHLORIDE 0.9 % IV SOLN
Freq: Once | INTRAVENOUS | Status: AC
  Administered 2016-03-01: 09:00:00 via INTRAVENOUS

## 2016-03-01 NOTE — Progress Notes (Signed)
Pt inquired about TPA because port did not produce blood return at last appt 8/3. Upon flush today 0850 no blood return noted. Attempted exercises to produce blood return with no success. Pt agreeable for PIV start to run zometa. TPA performed for half hour 0900-0930. Blood return noted at end of therapy. 70m wasted, 218mNS flushed, 1059meparin flushed, and port deaccessed.

## 2016-03-01 NOTE — Patient Instructions (Signed)

## 2016-03-09 DIAGNOSIS — N5201 Erectile dysfunction due to arterial insufficiency: Secondary | ICD-10-CM | POA: Diagnosis not present

## 2016-03-09 DIAGNOSIS — N486 Induration penis plastica: Secondary | ICD-10-CM | POA: Diagnosis not present

## 2016-03-15 ENCOUNTER — Other Ambulatory Visit: Payer: Self-pay | Admitting: *Deleted

## 2016-03-15 ENCOUNTER — Other Ambulatory Visit (INDEPENDENT_AMBULATORY_CARE_PROVIDER_SITE_OTHER): Payer: Medicare Other

## 2016-03-15 ENCOUNTER — Other Ambulatory Visit (HOSPITAL_BASED_OUTPATIENT_CLINIC_OR_DEPARTMENT_OTHER): Payer: Medicare Other

## 2016-03-15 ENCOUNTER — Ambulatory Visit (HOSPITAL_BASED_OUTPATIENT_CLINIC_OR_DEPARTMENT_OTHER): Payer: Medicare Other

## 2016-03-15 DIAGNOSIS — I82441 Acute embolism and thrombosis of right tibial vein: Secondary | ICD-10-CM

## 2016-03-15 DIAGNOSIS — C9000 Multiple myeloma not having achieved remission: Secondary | ICD-10-CM

## 2016-03-15 DIAGNOSIS — Z5111 Encounter for antineoplastic chemotherapy: Secondary | ICD-10-CM | POA: Diagnosis not present

## 2016-03-15 DIAGNOSIS — Z23 Encounter for immunization: Secondary | ICD-10-CM | POA: Diagnosis not present

## 2016-03-15 LAB — COMPREHENSIVE METABOLIC PANEL
ALT: 18 U/L (ref 0–55)
AST: 19 U/L (ref 5–34)
Albumin: 3.2 g/dL — ABNORMAL LOW (ref 3.5–5.0)
Alkaline Phosphatase: 51 U/L (ref 40–150)
Anion Gap: 9 mEq/L (ref 3–11)
BILIRUBIN TOTAL: 0.97 mg/dL (ref 0.20–1.20)
BUN: 10.8 mg/dL (ref 7.0–26.0)
CHLORIDE: 111 meq/L — AB (ref 98–109)
CO2: 24 meq/L (ref 22–29)
Calcium: 9.3 mg/dL (ref 8.4–10.4)
Creatinine: 0.9 mg/dL (ref 0.7–1.3)
EGFR: 82 mL/min/{1.73_m2} — AB (ref >90)
GLUCOSE: 98 mg/dL (ref 70–140)
Potassium: 3.7 mEq/L (ref 3.5–5.1)
SODIUM: 144 meq/L (ref 136–145)
TOTAL PROTEIN: 6.6 g/dL (ref 6.4–8.3)

## 2016-03-15 LAB — CBC WITH DIFFERENTIAL/PLATELET
BASO%: 4.7 % — AB (ref 0.0–2.0)
Basophils Absolute: 0.2 10*3/uL — ABNORMAL HIGH (ref 0.0–0.1)
EOS%: 10.3 % — ABNORMAL HIGH (ref 0.0–7.0)
Eosinophils Absolute: 0.4 10*3/uL (ref 0.0–0.5)
HCT: 39.1 % (ref 38.4–49.9)
HGB: 13.2 g/dL (ref 13.0–17.1)
LYMPH%: 21.1 % (ref 14.0–49.0)
MCH: 31.6 pg (ref 27.2–33.4)
MCHC: 33.8 g/dL (ref 32.0–36.0)
MCV: 93.5 fL (ref 79.3–98.0)
MONO#: 0.4 10*3/uL (ref 0.1–0.9)
MONO%: 10.3 % (ref 0.0–14.0)
NEUT%: 53.6 % (ref 39.0–75.0)
NEUTROS ABS: 1.9 10*3/uL (ref 1.5–6.5)
Platelets: 156 10*3/uL (ref 140–400)
RBC: 4.18 10*6/uL — AB (ref 4.20–5.82)
RDW: 14.6 % (ref 11.0–14.6)
WBC: 3.6 10*3/uL — AB (ref 4.0–10.3)
lymph#: 0.8 10*3/uL — ABNORMAL LOW (ref 0.9–3.3)

## 2016-03-15 LAB — LACTATE DEHYDROGENASE: LDH: 158 U/L (ref 125–245)

## 2016-03-15 MED ORDER — HEPARIN SOD (PORK) LOCK FLUSH 100 UNIT/ML IV SOLN
500.0000 [IU] | Freq: Once | INTRAVENOUS | Status: AC | PRN
  Administered 2016-03-15: 500 [IU]
  Filled 2016-03-15: qty 5

## 2016-03-15 MED ORDER — SODIUM CHLORIDE 0.9 % IJ SOLN
10.0000 mL | INTRAMUSCULAR | Status: DC | PRN
  Administered 2016-03-15: 10 mL
  Filled 2016-03-15: qty 10

## 2016-03-15 NOTE — Telephone Encounter (Signed)
Message from pr requesting Revlimid refill to be sent to [facility]. He has called pharmacy and they need a prescription sent. Message forwarded to Dr. Worthy Flank RN.

## 2016-03-16 ENCOUNTER — Other Ambulatory Visit: Payer: Self-pay | Admitting: Medical Oncology

## 2016-03-16 DIAGNOSIS — C9000 Multiple myeloma not having achieved remission: Secondary | ICD-10-CM

## 2016-03-16 LAB — BETA 2 MICROGLOBULIN, SERUM: BETA 2: 1.7 mg/L (ref 0.6–2.4)

## 2016-03-16 LAB — KAPPA/LAMBDA LIGHT CHAINS
Ig Kappa Free Light Chain: 46.5 mg/L — ABNORMAL HIGH (ref 3.3–19.4)
Ig Lambda Free Light Chain: 33 mg/L — ABNORMAL HIGH (ref 5.7–26.3)
KAPPA/LAMBDA FLC RATIO: 1.41 (ref 0.26–1.65)

## 2016-03-16 LAB — IGG, IGA, IGM
IGM (IMMUNOGLOBIN M), SRM: 27 mg/dL (ref 15–143)
IgA, Qn, Serum: 166 mg/dL (ref 61–437)
IgG, Qn, Serum: 1195 mg/dL (ref 700–1600)

## 2016-03-16 MED ORDER — LENALIDOMIDE 10 MG PO CAPS: 10.0000 mg | ORAL_CAPSULE | Freq: Every day | ORAL | 0 refills | Status: DC

## 2016-03-16 NOTE — Telephone Encounter (Signed)
Refill faxed

## 2016-03-22 ENCOUNTER — Ambulatory Visit: Payer: Self-pay | Admitting: Internal Medicine

## 2016-03-29 ENCOUNTER — Encounter: Payer: Self-pay | Admitting: Internal Medicine

## 2016-03-29 ENCOUNTER — Ambulatory Visit (HOSPITAL_BASED_OUTPATIENT_CLINIC_OR_DEPARTMENT_OTHER): Payer: Medicare Other | Admitting: Internal Medicine

## 2016-03-29 DIAGNOSIS — G620 Drug-induced polyneuropathy: Secondary | ICD-10-CM | POA: Diagnosis not present

## 2016-03-29 DIAGNOSIS — G4700 Insomnia, unspecified: Secondary | ICD-10-CM

## 2016-03-29 DIAGNOSIS — I82441 Acute embolism and thrombosis of right tibial vein: Secondary | ICD-10-CM

## 2016-03-29 DIAGNOSIS — Z5111 Encounter for antineoplastic chemotherapy: Secondary | ICD-10-CM

## 2016-03-29 DIAGNOSIS — C9000 Multiple myeloma not having achieved remission: Secondary | ICD-10-CM

## 2016-03-29 DIAGNOSIS — M549 Dorsalgia, unspecified: Secondary | ICD-10-CM | POA: Diagnosis not present

## 2016-03-29 DIAGNOSIS — Z7901 Long term (current) use of anticoagulants: Secondary | ICD-10-CM

## 2016-03-29 DIAGNOSIS — Z86711 Personal history of pulmonary embolism: Secondary | ICD-10-CM

## 2016-03-29 MED ORDER — OXYCODONE-ACETAMINOPHEN 5-325 MG PO TABS: 1.0000 | ORAL_TABLET | ORAL | 0 refills | Status: DC | PRN

## 2016-03-29 NOTE — Progress Notes (Signed)
[place] [phone]   [phone] [place], MD [place] [address]  DIAGNOSIS: Stage IIA multiple myeloma diagnosed in July of 2013.   PRIOR THERAPY:  1) Systemic chemotherapy with subcutaneous Velcade 1.3 mg/M2 on days 1, 4, 8 and 11 every 3 weeks in addition to Revlimid 25 mg by mouth daily for 14 days every 3 weeks and dexamethasone 40 mg on a weekly basis. Status post 4 planned cycles.  2) Systemic chemotherapy with subcutaneous Velcade 1.3 mg/M2 on days 1, 4, 8 and 11 every 3 weeks in addition to Revlimid 25 mg by mouth daily for 14 days every 3 weeks and dexamethasone 40 mg on a weekly basis. 4 more cycles are planned and he is status post 4 cycles of the second set of 4 cycles, now status post a total of 10 cycles. 3) Systemic chemotherapy with Carfilzomib 36 mg/M2 on days 1, 2, 8, 9, 15 and 16 in addition to cyclophosphamide 300 mg/M2 IV on days 1, 8 and 15 as well as dexamethasone on days 1, 8, 15 and 22 every 4 weeks. Status post 4 cycles. 4) Zometa 4 mg IV every month. 5) autologous peripheral blood stem cell transplant on 03/20/2013 at [facility]. 6) Consolidation chemotherapy with Carfilzomib 36 mg/M2 on days 1, 2, 8, 9, 15 and 16 every 4 weeks in addition to cyclophosphamide 300 mg/M2 on days 1, 8 and 15 as well as dexamethasone 40 mg on a weekly basis every 4 weeks. Status post 2 cycles. First cycle 06/11/2013. 7) Maintenance therapy with Revlimid 10 mg by mouth daily status post 3 months of treatment. 8) palliative radiotherapy to the lytic lesion in the skull under the care of Dr. Sondra Come 9) Maintenance therapy with Revlimid 15 mg by mouth daily started 11/09/2013 and discontinued 02/18/2015 secondary to persistent neutropenia.   CURRENT THERAPY:  1) Maintenance therapy with Revlimid 10 mg by mouth daily started 02/19/2015. 2) Zometa 4 mg IV every 3 months. 3) Xarelto 20 mg by  mouth daily for pulmonary embolism.   INTERVAL HISTORY: [name] 75 y.o. male returns to the clinic today for routine monthly followup visit. The patient is feeling fine today with no specific complaints except left flank pain after a regular exercise at the [facility]. He also noticed increased urination recently. He is followed by Dr. Karsten Ro, his urologist. He has no concerning complaints during his overseas travel except for mild swelling in his lower extremities from walking too much and eating salty food. He denied having any significant fatigue or weakness. He denied having any fever or chills. He has no nausea or vomiting.  He denied having any significant chest pain, shortness of breath, cough or hemoptysis. He is tolerating his maintenance treatment with Revlimid fairly well. He had repeat myeloma panel performed recently and he is here for evaluation and discussion of his lab results.  MEDICAL HISTORY: Past Medical History:  Diagnosis Date  . Adenomatous polyp of colon 1996  . Arthritis    Osteoarthritis right knee, spine, wrist  . Arthritis of hand, right    Right thumb (Dr. Daylene Katayama)  . Back ache    r/o Multiple Myeloma  . Benign prostatic hypertrophy    (Dr. Karsten Ro in past)  . Degenerative disc disease   . Diverticulosis   . DVT (deep venous thrombosis) ([place]) 01/2014   R calf  . Encounter for antineoplastic chemotherapy 10/20/2015  .  Erosive esophagitis   . GERD (gastroesophageal reflux disease)   . Hemorrhoids   . Hiatal hernia   . History of chronic prostatitis   . History of melanoma    Back (Dr. Wilhemina Bonito); early melanoma R arm 03/2011  . Multiple myeloma ([place]) 2013   skull lytic lesions--treated with radiation 07/2014  . Pulmonary embolism ([place]) 01/2014  . Radiation 07/21/14-08/05/14   left occipital parietal skull 25 gray  . SCC (squamous cell carcinoma) 08/04/15   right ear;dysplastic nevus with severe atypia-chest    ALLERGIES:  is allergic to amoxicillin and  penicillins.  MEDICATIONS:  Current Outpatient Prescriptions  Medication Sig Dispense Refill  . ascorbic acid (VITAMIN C) 500 MG tablet Take 500 mg by mouth 2 (two) times daily.     . cholecalciferol (VITAMIN D) 1000 UNITS tablet Take 1,000 Units by mouth daily.    Marland Kitchen dicyclomine (BENTYL) 10 MG capsule Take 1 capsule (10 mg total) by mouth 3 (three) times daily before meals. (Patient not taking: Reported on 02/29/2016) 60 capsule 0  . fish oil-omega-3 fatty acids 1000 MG capsule Take 1 g by mouth daily.     . furosemide (LASIX) 20 MG tablet Take 1 tablet (20 mg total) by mouth daily. 30 tablet 0  . lenalidomide (REVLIMID) 10 MG capsule Take 1 capsule (10 mg total) by mouth daily. 28 capsule 0  . LORazepam (ATIVAN) 1 MG tablet Take 1 tablet (1 mg total) by mouth every 8 (eight) hours as needed for anxiety. (Patient not taking: Reported on 02/29/2016) 30 tablet 0  . methocarbamol (ROBAXIN) 500 MG tablet TAKE 1 TO 2 TABLETS BY MOUTH EVERY 6 HOURS AS NEEDED FOR MUSCLE SPASMS (Patient not taking: Reported on 02/29/2016) 60 tablet 0  . Multiple Vitamin (MULTIVITAMIN WITH MINERALS) TABS Take 1 tablet by mouth daily.    Marland Kitchen oxyCODONE-acetaminophen (PERCOCET/ROXICET) 5-325 MG tablet Take 1 tablet by mouth every 4 (four) hours as needed.  0  . pantoprazole (PROTONIX) 40 MG tablet Take 1 tablet by mouth two  times daily 180 tablet 1  . Probiotic Product (ALIGN) 4 MG CAPS Take 1 capsule by mouth daily. Reported on 09/10/2015    . prochlorperazine (COMPAZINE) 10 MG tablet Take 1 tablet (10 mg total) by mouth every 6 (six) hours as needed. (Patient not taking: Reported on 02/29/2016) 30 tablet 1  . temazepam (RESTORIL) 15 MG capsule Take 1 capsule (15 mg total) by mouth at bedtime as needed. 60 capsule 0  . XARELTO 20 MG TABS tablet Take 1 tablet by mouth  daily 90 tablet 0  . zolendronic acid (ZOMETA) 4 MG/5ML injection Inject 4 mg into the vein every 28 (twenty-eight) days.     No current facility-administered  medications for this visit.    Facility-Administered Medications Ordered in Other Visits  Medication Dose Route Frequency Provider Last Rate Last Dose  . ondansetron (ZOFRAN) tablet 8 mg  8 mg Oral Once Curt Bears, MD        SURGICAL HISTORY:  Past Surgical History:  Procedure Laterality Date  . APPENDECTOMY    . BONE MARROW TRANSPLANT  03/2013   Stem Cell  . COLONOSCOPY  2009   adenomatous polyp  . ESOPHAGOGASTRODUODENOSCOPY  2009   mild inflammation at esophagogastric junction  . excision of melanoma     back ( x3)  . Great toe surgery     bilateral  . INGUINAL HERNIA REPAIR     left, x 2  . port placemet  10/2012  .  TOTAL KNEE ARTHROPLASTY  06/08/2011   Procedure: TOTAL KNEE ARTHROPLASTY;  Surgeon: Ninetta Lights, MD;  Location: [facility];  Service: Orthopedics;  Laterality: Right;  osteonics  . total knee replaced  2000   Left knee    REVIEW OF SYSTEMS:  Constitutional: negative Eyes: negative Ears, nose, mouth, throat, and face: negative Respiratory: negative Cardiovascular: negative Gastrointestinal: negative Genitourinary:positive for frequency Integument/breast: negative Hematologic/lymphatic: negative Musculoskeletal:positive for back pain Neurological: negative Behavioral/Psych: negative Endocrine: negative Allergic/Immunologic: negative   PHYSICAL EXAMINATION: General appearance: alert, cooperative and no distress Head: Normocephalic, without obvious abnormality, atraumatic, Ecchymosis at the right frontal area of the face. Neck: no adenopathy, no JVD, supple, symmetrical, trachea midline and thyroid not enlarged, symmetric, no tenderness/mass/nodules Lymph nodes: Cervical, supraclavicular, and axillary nodes normal. Resp: clear to auscultation bilaterally Back: symmetric, no curvature. ROM normal. No CVA tenderness. Cardio: regular rate and rhythm, S1, S2 normal, no murmur, click, rub or gallop GI: soft, non-tender; bowel sounds normal; no masses,  no  organomegaly Extremities: extremities normal, atraumatic, no cyanosis or edema Neurologic: Alert and oriented X 3, normal strength and tone. Normal symmetric reflexes. Normal coordination and gait  ECOG PERFORMANCE STATUS: 1 - Symptomatic but completely ambulatory  Blood pressure 116/77, pulse 90, temperature 97.9 F (36.6 C), temperature source Oral, resp. rate 18, height 5' 9.25" (1.759 m), weight 214 lb 3.2 oz (97.2 kg), SpO2 99 %.  LABORATORY DATA: Lab Results  Component Value Date   WBC 3.6 (L) 03/15/2016   HGB 13.2 03/15/2016   HCT 39.1 03/15/2016   MCV 93.5 03/15/2016   PLT 156 03/15/2016      Chemistry      Component Value Date/Time   NA 144 03/15/2016 0912   K 3.7 03/15/2016 0912   CL 108 09/10/2015 0954   CL 109 (H) 12/31/2012 0759   CO2 24 03/15/2016 0912   BUN 10.8 03/15/2016 0912   CREATININE 0.9 03/15/2016 0912      Component Value Date/Time   CALCIUM 9.3 03/15/2016 0912   ALKPHOS 51 03/15/2016 0912   AST 19 03/15/2016 0912   ALT 18 03/15/2016 0912   BILITOT 0.97 03/15/2016 0912      RADIOGRAPHIC STUDIES: No results found.  ASSESSMENT AND PLAN: This is a very pleasant 75 years old white male with history of multiple myeloma status post induction chemotherapy with Velcade, Revlimid and dexamethasone followed by treatment with Carfilzomib, cyclophosphamide and dexamethasone with significant improvement in his disease followed by autologous peripheral blood stem cell transplant at [facility] on 03/20/2013. This was followed by 2 cycles of consolidation chemotherapy with Carfilzomib, Cytoxan and dexamethasone, and he tolerated it fairly well. He completed maintenance Revlimid 10 mg by mouth daily for 3 months and tolerating it fairly well. He started maintenance Revlimid with 15 mg by mouth daily in May 2015 and tolerating it well except for the persistent neutropenia.  He started the reduced dose of Revlimid 10 mg by mouth daily on 02/19/2015. He is  tolerating this treatment much better.  The recent myeloma panel showed no evidence for disease progression. I discussed the lab result with the patient today. I recommended for him to continue his current treatment with Revlimid 10 mg by mouth daily. He will continue treatment with Zometa every 3 months.  For the pulmonary embolism, he will continue on Xarelto 20 mg by mouth daily as scheduled.  For insomnia he will continue on Restoril. For the peripheral neuropathy, I started the patient on Neurontin 100 mg  by mouth 3 times a day. For the back pain, he was given a refill of Percocet. He would come back for follow-up visit in 6 weeks for reevaluation with repeat blood work. For the urine frequency, I recommended for the patient to contact his urologist for further evaluation. He was advised to call immediately if he has any concerning symptoms in the interval. The patient voices understanding of current disease status and treatment options and is in agreement with the current care plan.  All questions were answered. The patient knows to call the clinic with any problems, questions or concerns. We can certainly see the patient much sooner if necessary.  Disclaimer: This note was dictated with voice recognition software. Similar sounding words can inadvertently be transcribed and may not be corrected upon review.

## 2016-03-31 ENCOUNTER — Telehealth: Payer: Self-pay | Admitting: Internal Medicine

## 2016-03-31 ENCOUNTER — Telehealth: Payer: Self-pay | Admitting: *Deleted

## 2016-03-31 NOTE — Telephone Encounter (Signed)
Per LOS I have scheduled appts and notified the scheduler 

## 2016-03-31 NOTE — Telephone Encounter (Signed)
10/30 appointments scheduled per 09/19 LOS. Patient requested AM appointments and 10/26 Infusion appointment to be the same day.

## 2016-04-01 ENCOUNTER — Other Ambulatory Visit: Payer: Self-pay | Admitting: Family Medicine

## 2016-04-01 DIAGNOSIS — R609 Edema, unspecified: Secondary | ICD-10-CM

## 2016-04-01 NOTE — Telephone Encounter (Signed)
This might be auto-refill, please check with pt.  He uses this prn for edema, NOT daily, and was last refilled by me 8/21

## 2016-04-01 NOTE — Telephone Encounter (Signed)
Is this ok to refill?  

## 2016-04-05 DIAGNOSIS — Z7901 Long term (current) use of anticoagulants: Secondary | ICD-10-CM | POA: Diagnosis not present

## 2016-04-05 DIAGNOSIS — Z9484 Stem cells transplant status: Secondary | ICD-10-CM | POA: Diagnosis not present

## 2016-04-05 DIAGNOSIS — K219 Gastro-esophageal reflux disease without esophagitis: Secondary | ICD-10-CM | POA: Diagnosis not present

## 2016-04-05 DIAGNOSIS — Z86010 Personal history of colonic polyps: Secondary | ICD-10-CM | POA: Diagnosis not present

## 2016-04-05 DIAGNOSIS — C9001 Multiple myeloma in remission: Secondary | ICD-10-CM | POA: Diagnosis not present

## 2016-04-05 DIAGNOSIS — I82409 Acute embolism and thrombosis of unspecified deep veins of unspecified lower extremity: Secondary | ICD-10-CM | POA: Diagnosis not present

## 2016-04-05 DIAGNOSIS — Z7983 Long term (current) use of bisphosphonates: Secondary | ICD-10-CM | POA: Diagnosis not present

## 2016-04-05 DIAGNOSIS — Z9221 Personal history of antineoplastic chemotherapy: Secondary | ICD-10-CM | POA: Diagnosis not present

## 2016-04-08 ENCOUNTER — Other Ambulatory Visit: Payer: Self-pay | Admitting: Medical Oncology

## 2016-04-08 DIAGNOSIS — C9000 Multiple myeloma not having achieved remission: Secondary | ICD-10-CM

## 2016-04-08 MED ORDER — TEMAZEPAM 15 MG PO CAPS: 15.0000 mg | ORAL_CAPSULE | Freq: Every evening | ORAL | 0 refills | Status: AC | PRN

## 2016-04-12 DIAGNOSIS — L570 Actinic keratosis: Secondary | ICD-10-CM | POA: Diagnosis not present

## 2016-04-12 DIAGNOSIS — Z85828 Personal history of other malignant neoplasm of skin: Secondary | ICD-10-CM | POA: Diagnosis not present

## 2016-04-12 DIAGNOSIS — D225 Melanocytic nevi of trunk: Secondary | ICD-10-CM | POA: Diagnosis not present

## 2016-04-12 DIAGNOSIS — L821 Other seborrheic keratosis: Secondary | ICD-10-CM | POA: Diagnosis not present

## 2016-04-12 DIAGNOSIS — D2261 Melanocytic nevi of right upper limb, including shoulder: Secondary | ICD-10-CM | POA: Diagnosis not present

## 2016-04-12 DIAGNOSIS — C4441 Basal cell carcinoma of skin of scalp and neck: Secondary | ICD-10-CM | POA: Diagnosis not present

## 2016-04-12 DIAGNOSIS — Z85820 Personal history of malignant melanoma of skin: Secondary | ICD-10-CM | POA: Diagnosis not present

## 2016-04-13 ENCOUNTER — Telehealth: Payer: Self-pay | Admitting: *Deleted

## 2016-04-13 DIAGNOSIS — C9000 Multiple myeloma not having achieved remission: Secondary | ICD-10-CM

## 2016-04-13 MED ORDER — LENALIDOMIDE 10 MG PO CAPS: 10.0000 mg | ORAL_CAPSULE | Freq: Every day | ORAL | 0 refills | Status: DC

## 2016-04-13 NOTE — Telephone Encounter (Signed)
"  I am down to six pills of Revlimid and need Dr. [name] to send this in to [facility].  I'll call them to set up delivery."

## 2016-04-18 ENCOUNTER — Telehealth: Payer: Self-pay | Admitting: Family Medicine

## 2016-04-18 NOTE — Telephone Encounter (Signed)
Patient sent a text stating that he was out of town.  He wants to be seen early Thursday morning.  He thinks he has developed diabetes--having extreme thirst and excessive urination.  Please contact the patient and schedule him for 30 min visit Thursday morning (11am, if still available).  Thanks  (pt was advised to be sure not to take lasix, which might contribute to his symptoms).

## 2016-04-19 ENCOUNTER — Telehealth: Payer: Self-pay | Admitting: *Deleted

## 2016-04-19 NOTE — Telephone Encounter (Signed)
Patient called and left a voicemail message regarding headaches. He stated,"Dr. [name] wanted me to let him know if I was still having headaches and I am. I am out of town this week, but can see him any day and time next week."

## 2016-04-19 NOTE — Telephone Encounter (Signed)
Pt made the appt

## 2016-04-20 ENCOUNTER — Telehealth: Payer: Self-pay | Admitting: *Deleted

## 2016-04-20 NOTE — Telephone Encounter (Signed)
Patient called and left a message on my VM-he has an appt tomorrow at 11:00 to discuss possible diabetes with you. Has been having excessive urination, etc. He wants to know if he should fast for this appt and wants me to call him back and leave a message on his cell phone. Thanks.

## 2016-04-20 NOTE — Telephone Encounter (Signed)
We may need to repeat CT head.

## 2016-04-20 NOTE — Telephone Encounter (Signed)
Pt also texted me--addressed. He will fast

## 2016-04-21 ENCOUNTER — Other Ambulatory Visit: Payer: Self-pay | Admitting: Medical Oncology

## 2016-04-21 ENCOUNTER — Encounter: Payer: Self-pay | Admitting: Family Medicine

## 2016-04-21 ENCOUNTER — Ambulatory Visit (INDEPENDENT_AMBULATORY_CARE_PROVIDER_SITE_OTHER): Payer: Medicare Other | Admitting: Family Medicine

## 2016-04-21 ENCOUNTER — Telehealth: Payer: Self-pay | Admitting: Medical Oncology

## 2016-04-21 VITALS — BP 110/70 | HR 64 | Temp 97.3°F | Ht 69.25 in | Wt 212.8 lb

## 2016-04-21 DIAGNOSIS — R631 Polydipsia: Secondary | ICD-10-CM

## 2016-04-21 DIAGNOSIS — G4452 New daily persistent headache (NDPH): Secondary | ICD-10-CM

## 2016-04-21 DIAGNOSIS — R350 Frequency of micturition: Secondary | ICD-10-CM | POA: Diagnosis not present

## 2016-04-21 DIAGNOSIS — R358 Other polyuria: Secondary | ICD-10-CM | POA: Diagnosis not present

## 2016-04-21 DIAGNOSIS — C9000 Multiple myeloma not having achieved remission: Secondary | ICD-10-CM

## 2016-04-21 DIAGNOSIS — R519 Headache, unspecified: Secondary | ICD-10-CM

## 2016-04-21 DIAGNOSIS — R51 Headache: Secondary | ICD-10-CM | POA: Diagnosis not present

## 2016-04-21 DIAGNOSIS — R3589 Other polyuria: Secondary | ICD-10-CM

## 2016-04-21 LAB — POCT URINALYSIS DIPSTICK
Bilirubin, UA: NEGATIVE
Blood, UA: NEGATIVE
GLUCOSE UA: NEGATIVE
KETONES UA: NEGATIVE
LEUKOCYTES UA: NEGATIVE
NITRITE UA: NEGATIVE
Protein, UA: NEGATIVE
SPEC GRAV UA: 1.01
Urobilinogen, UA: NEGATIVE
pH, UA: 6.5

## 2016-04-21 LAB — CBC WITH DIFFERENTIAL/PLATELET
BASOS ABS: 185 {cells}/uL (ref 0–200)
BASOS PCT: 5 %
Eosinophils Absolute: 407 cells/uL (ref 15–500)
Eosinophils Relative: 11 %
HEMATOCRIT: 41.9 % (ref 38.5–50.0)
Hemoglobin: 14.1 g/dL (ref 13.2–17.1)
LYMPHS ABS: 777 {cells}/uL — AB (ref 850–3900)
LYMPHS PCT: 21 %
MCH: 32 pg (ref 27.0–33.0)
MCHC: 33.7 g/dL (ref 32.0–36.0)
MCV: 95.2 fL (ref 80.0–100.0)
MONOS PCT: 11 %
MPV: 9.6 fL (ref 7.5–12.5)
Monocytes Absolute: 407 cells/uL (ref 200–950)
NEUTROS ABS: 1924 {cells}/uL (ref 1500–7800)
Neutrophils Relative %: 52 %
PLATELETS: 169 10*3/uL (ref 140–400)
RBC: 4.4 MIL/uL (ref 4.20–5.80)
RDW: 15.5 % — AB (ref 11.0–15.0)
WBC: 3.7 10*3/uL — AB (ref 4.0–10.5)

## 2016-04-21 LAB — COMPREHENSIVE METABOLIC PANEL
ALBUMIN: 3.9 g/dL (ref 3.6–5.1)
ALT: 24 U/L (ref 9–46)
AST: 24 U/L (ref 10–35)
Alkaline Phosphatase: 53 U/L (ref 40–115)
BUN: 9 mg/dL (ref 7–25)
CALCIUM: 8.9 mg/dL (ref 8.6–10.3)
CHLORIDE: 112 mmol/L — AB (ref 98–110)
CO2: 27 mmol/L (ref 20–31)
Creat: 0.94 mg/dL (ref 0.70–1.18)
Glucose, Bld: 89 mg/dL (ref 65–99)
POTASSIUM: 3.8 mmol/L (ref 3.5–5.3)
Sodium: 146 mmol/L (ref 135–146)
TOTAL PROTEIN: 6.8 g/dL (ref 6.1–8.1)
Total Bilirubin: 0.6 mg/dL (ref 0.2–1.2)

## 2016-04-21 LAB — LACTATE DEHYDROGENASE: LDH: 131 U/L (ref 120–250)

## 2016-04-21 MED ORDER — MIRABEGRON ER 25 MG PO TB24: 25.0000 mg | ORAL_TABLET | Freq: Every day | ORAL | 0 refills | Status: DC

## 2016-04-21 MED ORDER — TRAMADOL HCL 50 MG PO TABS: 50.0000 mg | ORAL_TABLET | Freq: Three times a day (TID) | ORAL | 0 refills | Status: DC | PRN

## 2016-04-21 NOTE — Patient Instructions (Signed)
We will be in touch with the test results soon. I agree with recommendation for the CT scan, to evaluate the pain you are having at your forehead. Try the tramadol to see if it helps with your pain (do not use with hydrocodone; may make you sleepy--use with caution).  I have given you a sample of myrbetriq which is a medication for overactive bladder.  Something you can try for your upcoming trip, will not help if the underlying cause of your symptoms is something else, but worth a try.  You do NOT have diabetes mellitus.  Your fasting sugar was a little elevated at 107 (mild impaired fasting glucose, which is pre-diabetic).

## 2016-04-21 NOTE — Telephone Encounter (Signed)
Pt contacted that head ct will be ordered and scheduled for next week.

## 2016-04-21 NOTE — Progress Notes (Signed)
Chief Complaint  Patient presents with  . Urinary Frequency    gets up 5 times per night, excessive thirst and dry mouth. Headache x 1 week.    2 weeks ago he started with excessive urination and extreme thirst.  Drinking a quart of water when getting up at night.  Getting up every 2 hours to void. Urinary frequency is the same during the day as at night (every 2 hours).  He has NOT taken any furosemide recently. He has tried some compression socks (from [employer], where his daughter works), which has been helpful in preventing any swelling.  Denies dysuria, hematuria.  Sometimes stream is immediate, sometimes there is hesitancy.  Sometimes has some urgency shortly after voiding, and can void again immediately after. This is not new for him. He last saw Dr. Karsten Ro last month.  He has a bad headache for the last week, across his whole forehead, extends in front of the right ear, behind right eye. He has some runny nose just first thing in the morning, denies congestion or cold symptoms. Denies sore throat cough.  He was in [place], [place]000 [place] from 10[place]6-11.  His headache started a little prior to leaving.  Pain is throbbing.  Tylenol hasn't been helpful.  Oxycodone hasn't helped with the pain very much (caused a lot of constipation).  He is on blood thinners and can't take NSAIDs.  He is asking for pain medication.  Denies chest pain, shortness of breath.  He is getting a CT scheduled (by Dr. Lew Dawes office) due to recurrent posterior skull pain, concern for multiple myeloma (had radiation to treat posterior skull pain in the past).  They are working on getting authorization and getting it scheduled.  He is worried he might have diabetes as cause for his excessive thirst and frequent urination.  He denies vision changes, weight loss, numbness, tingling.  He is fasting today.  PMH, PSH, SH reviewed  Outpatient Encounter Prescriptions as of 04/21/2016  Medication Sig Note  . ascorbic acid  (VITAMIN C) 500 MG tablet Take 500 mg by mouth 2 (two) times daily.    . cholecalciferol (VITAMIN D) 1000 UNITS tablet Take 1,000 Units by mouth daily.   . fish oil-omega-3 fatty acids 1000 MG capsule Take 1 g by mouth daily.    Marland Kitchen lenalidomide (REVLIMID) 10 MG capsule Take 1 capsule (10 mg total) by mouth daily.   . Multiple Vitamin (MULTIVITAMIN WITH MINERALS) TABS Take 1 tablet by mouth daily.   . pantoprazole (PROTONIX) 40 MG tablet Take 1 tablet by mouth two  times daily   . Probiotic Product (ALIGN) 4 MG CAPS Take 1 capsule by mouth daily. Reported on 09/10/2015   . XARELTO 20 MG TABS tablet Take 1 tablet by mouth  daily   . zolendronic acid (ZOMETA) 4 MG/5ML injection Inject 4 mg into the vein every 28 (twenty-eight) days. 09/10/2015: .  . dicyclomine (BENTYL) 10 MG capsule Take 1 capsule (10 mg total) by mouth 3 (three) times daily before meals. (Patient not taking: Reported on 04/21/2016) 04/21/2016: Uses prn IBS symptoms, not recently  . furosemide (LASIX) 20 MG tablet Take 1 tablet (20 mg total) by mouth daily. (Patient not taking: Reported on 04/21/2016) 04/21/2016: Hasn't taken any in about a month  . LORazepam (ATIVAN) 1 MG tablet Take 1 tablet (1 mg total) by mouth every 8 (eight) hours as needed for anxiety. (Patient not taking: Reported on 04/21/2016) 04/21/2016: Uses prn for sleep, usually only when he  takes oxycodone  . methocarbamol (ROBAXIN) 500 MG tablet TAKE 1 TO 2 TABLETS BY MOUTH EVERY 6 HOURS AS NEEDED FOR MUSCLE SPASMS (Patient not taking: Reported on 04/21/2016)   . mirabegron ER (MYRBETRIQ) 25 MG TB24 tablet Take 1 tablet (25 mg total) by mouth daily.   Marland Kitchen oxyCODONE-acetaminophen (PERCOCET/ROXICET) 5-325 MG tablet Take 1 tablet by mouth every 4 (four) hours as needed. (Patient not taking: Reported on 04/21/2016)   . prochlorperazine (COMPAZINE) 10 MG tablet Take 1 tablet (10 mg total) by mouth every 6 (six) hours as needed. (Patient not taking: Reported on 04/21/2016)  07/01/2015: periodically  . temazepam (RESTORIL) 15 MG capsule Take 1 capsule (15 mg total) by mouth at bedtime as needed. (Patient not taking: Reported on 04/21/2016)   . traMADol (ULTRAM) 50 MG tablet Take 1-2 tablets (50-100 mg total) by mouth every 8 (eight) hours as needed for severe pain.    Facility-Administered Encounter Medications as of 04/21/2016  Medication  . ondansetron (ZOFRAN) tablet 8 mg   Allergies  Allergen Reactions  . Amoxicillin     REACTION: rash  . Penicillins Rash    Has patient had a PCN reaction causing immediate rash, facial/tongue/throat swelling, SOB or lightheadedness with hypotension: unknown Has patient had a PCN reaction causing severe rash involving mucus membranes or skin necrosis: unknown Has patient had a PCN reaction that required hospitalization: no  Has patient had a PCN reaction occurring within the last 10 years: no  If all of the above answers are "NO", then may proceed with Cephalosporin use.    ROS:  No fever, chills, URI symptoms (just some am congestion as per HPI).  Denies cough, shortness of breath, chest pain, significant edema, nausea, vomiting, diarrhea, bleeding, rash, significant joint pains.  See HPI for detailed concerns.   PHYSICAL EXAM:  BP 110/70 (BP Location: Left Arm, Patient Position: Sitting, Cuff Size: Normal)   Pulse 64   Temp 97.3 F (36.3 C)   Ht 5' 9.25" (1.759 m)   Wt 212 lb 12.8 oz (96.5 kg)   BMI 31.20 kg/m   Well appearing, pleasant male, in good spirits, in no distress HEENT: PERRL, EOMI, conjunctiva and sclera are clear. Tender at right temple, fairly diffusely over the bone and temporalis muscle. nontender on the left. Nasal mucosa is mildly edematous with slight clear drainage. Sinuses are nontender TM's and EAC's normal. OP is clear with moist mucus membranes Neck: no lymphadenopathy or mass Heart; regular rate and rhythm without murmur Lungs: clear Extremities: no edema Abdomen: soft,  nontender. Extremities: no edema Neuro: alert and oriented, cranial nerves intact, normal strength, gait Psych: normal mood, affect, hygiene and grooming   Urine dip normal, SG 1.010; neg glu Fasting glu 107. A1c normal (5.2)  ASSESSMENT/PLAN:  Polydipsia - pt reassured no evidence of DM; concern is for DI - Plan: Comprehensive metabolic panel, Osmolality, urine, Osmolality  Frequent urination - Plan: POCT Urinalysis Dipstick, HgB A1c  Excessive thirst - Plan: HgB A1c  Acute intractable headache, unspecified headache type - concern for lytic lesion from MM--agree with CT rec. no evidence of sinus infection. Altitude could have contributed to HA. Try Tramadol for pain relief - Plan: CBC with Differential/Platelet, Sedimentation Rate, traMADol (ULTRAM) 50 MG tablet  Multiple myeloma not having achieved remission ([place]) - Plan: Comprehensive metabolic panel, CBC with Differential/Platelet, Lactate Dehydrogenase  Polyuria - eval for diabetes insipidus.  He is going OOT and would like trial of med for OAB to see if symptoms are  reduced. Myrbetriq sample given - Plan: Comprehensive metabolic panel, Osmolality, urine, Osmolality   c-met, urine and serum osmolality ESR, LDH  claritin prn (may have slight allergy/sinus component to headache) Tramadol prn pain Agree with CT  Sample OAB med--going OOT, driving to [facility] Side effects reviewed.

## 2016-04-22 LAB — PATHOLOGIST SMEAR REVIEW

## 2016-04-22 LAB — OSMOLALITY: OSMOLALITY: 299 mosm/kg (ref 278–305)

## 2016-04-22 LAB — SEDIMENTATION RATE: Sed Rate: 10 mm/hr (ref 0–20)

## 2016-04-22 LAB — OSMOLALITY, URINE: OSMOLALITY UR: 140 mosm/kg (ref 50–1200)

## 2016-04-23 ENCOUNTER — Encounter: Payer: Self-pay | Admitting: Family Medicine

## 2016-04-25 ENCOUNTER — Other Ambulatory Visit: Payer: Self-pay | Admitting: *Deleted

## 2016-04-25 ENCOUNTER — Other Ambulatory Visit: Payer: Medicare Other

## 2016-04-25 DIAGNOSIS — E232 Diabetes insipidus: Secondary | ICD-10-CM

## 2016-04-25 LAB — POCT URINALYSIS DIPSTICK
Bilirubin, UA: NEGATIVE
Blood, UA: NEGATIVE
Glucose, UA: NEGATIVE
Ketones, UA: NEGATIVE
Leukocytes, UA: NEGATIVE
NITRITE UA: NEGATIVE
PROTEIN UA: NEGATIVE
SPEC GRAV UA: 1.015
UROBILINOGEN UA: NEGATIVE
pH, UA: 6

## 2016-04-25 LAB — POCT GLYCOSYLATED HEMOGLOBIN (HGB A1C): Hemoglobin A1C: 5.2

## 2016-04-25 MED ORDER — DESMOPRESSIN ACETATE 0.1 MG PO TABS: 0.1000 mg | ORAL_TABLET | Freq: Every day | ORAL | 0 refills | Status: AC

## 2016-04-26 ENCOUNTER — Encounter: Payer: Self-pay | Admitting: Family Medicine

## 2016-04-26 LAB — BASIC METABOLIC PANEL
BUN: 11 mg/dL (ref 7–25)
CHLORIDE: 108 mmol/L (ref 98–110)
CO2: 24 mmol/L (ref 20–31)
CREATININE: 1.11 mg/dL (ref 0.70–1.18)
Calcium: 8.7 mg/dL (ref 8.6–10.3)
GLUCOSE: 96 mg/dL (ref 65–99)
POTASSIUM: 3.7 mmol/L (ref 3.5–5.3)
Sodium: 141 mmol/L (ref 135–146)

## 2016-04-27 ENCOUNTER — Other Ambulatory Visit: Payer: Self-pay

## 2016-04-27 ENCOUNTER — Encounter (HOSPITAL_COMMUNITY): Payer: Self-pay | Admitting: Emergency Medicine

## 2016-04-27 ENCOUNTER — Telehealth: Payer: Self-pay | Admitting: *Deleted

## 2016-04-27 ENCOUNTER — Emergency Department (HOSPITAL_COMMUNITY): Payer: Medicare Other

## 2016-04-27 ENCOUNTER — Ambulatory Visit (HOSPITAL_COMMUNITY)
Admission: RE | Admit: 2016-04-27 | Discharge: 2016-04-27 | Disposition: A | Discharge status: Home / Self Care | Payer: Medicare Other | Source: Ambulatory Visit | Attending: Internal Medicine | Admitting: Internal Medicine

## 2016-04-27 ENCOUNTER — Emergency Department (HOSPITAL_COMMUNITY)
Admission: EM | Admit: 2016-04-27 | Discharge: 2016-04-27 | Disposition: A | Discharge status: Home / Self Care | Payer: Medicare Other | Attending: Emergency Medicine | Admitting: Emergency Medicine

## 2016-04-27 ENCOUNTER — Encounter (HOSPITAL_COMMUNITY): Payer: Self-pay

## 2016-04-27 DIAGNOSIS — G939 Disorder of brain, unspecified: Secondary | ICD-10-CM

## 2016-04-27 DIAGNOSIS — Z79899 Other long term (current) drug therapy: Secondary | ICD-10-CM | POA: Diagnosis not present

## 2016-04-27 DIAGNOSIS — G936 Cerebral edema: Secondary | ICD-10-CM

## 2016-04-27 DIAGNOSIS — C7931 Secondary malignant neoplasm of brain: Secondary | ICD-10-CM | POA: Diagnosis not present

## 2016-04-27 DIAGNOSIS — Z96651 Presence of right artificial knee joint: Secondary | ICD-10-CM | POA: Insufficient documentation

## 2016-04-27 DIAGNOSIS — Z7901 Long term (current) use of anticoagulants: Secondary | ICD-10-CM | POA: Insufficient documentation

## 2016-04-27 DIAGNOSIS — Z87891 Personal history of nicotine dependence: Secondary | ICD-10-CM | POA: Diagnosis not present

## 2016-04-27 DIAGNOSIS — C9000 Multiple myeloma not having achieved remission: Secondary | ICD-10-CM | POA: Insufficient documentation

## 2016-04-27 DIAGNOSIS — Z85820 Personal history of malignant melanoma of skin: Secondary | ICD-10-CM | POA: Insufficient documentation

## 2016-04-27 DIAGNOSIS — G4452 New daily persistent headache (NDPH): Secondary | ICD-10-CM | POA: Insufficient documentation

## 2016-04-27 DIAGNOSIS — Z96652 Presence of left artificial knee joint: Secondary | ICD-10-CM | POA: Diagnosis not present

## 2016-04-27 DIAGNOSIS — R42 Dizziness and giddiness: Secondary | ICD-10-CM | POA: Diagnosis not present

## 2016-04-27 DIAGNOSIS — Z923 Personal history of irradiation: Secondary | ICD-10-CM

## 2016-04-27 DIAGNOSIS — R51 Headache: Secondary | ICD-10-CM | POA: Diagnosis not present

## 2016-04-27 LAB — COMPREHENSIVE METABOLIC PANEL
ALK PHOS: 47 U/L (ref 38–126)
ALT: 31 U/L (ref 17–63)
ANION GAP: 5 (ref 5–15)
AST: 30 U/L (ref 15–41)
Albumin: 3.1 g/dL — ABNORMAL LOW (ref 3.5–5.0)
BILIRUBIN TOTAL: 0.4 mg/dL (ref 0.3–1.2)
BUN: 10 mg/dL (ref 6–20)
CALCIUM: 8.4 mg/dL — AB (ref 8.9–10.3)
CO2: 24 mmol/L (ref 22–32)
Chloride: 105 mmol/L (ref 101–111)
Creatinine, Ser: 0.98 mg/dL (ref 0.61–1.24)
Glucose, Bld: 93 mg/dL (ref 65–99)
Potassium: 3.7 mmol/L (ref 3.5–5.1)
Sodium: 134 mmol/L — ABNORMAL LOW (ref 135–145)
TOTAL PROTEIN: 6 g/dL — AB (ref 6.5–8.1)

## 2016-04-27 LAB — CBC
HEMATOCRIT: 37.6 % — AB (ref 39.0–52.0)
HEMOGLOBIN: 12.8 g/dL — AB (ref 13.0–17.0)
MCH: 31.3 pg (ref 26.0–34.0)
MCHC: 34 g/dL (ref 30.0–36.0)
MCV: 91.9 fL (ref 78.0–100.0)
Platelets: 150 10*3/uL (ref 150–400)
RBC: 4.09 MIL/uL — AB (ref 4.22–5.81)
RDW: 14.5 % (ref 11.5–15.5)
WBC: 3.6 10*3/uL — ABNORMAL LOW (ref 4.0–10.5)

## 2016-04-27 LAB — I-STAT CHEM 8, ED
BUN: 11 mg/dL (ref 6–20)
CREATININE: 1 mg/dL (ref 0.61–1.24)
Calcium, Ion: 1.1 mmol/L — ABNORMAL LOW (ref 1.15–1.40)
Chloride: 100 mmol/L — ABNORMAL LOW (ref 101–111)
GLUCOSE: 93 mg/dL (ref 65–99)
HEMATOCRIT: 39 % (ref 39.0–52.0)
Hemoglobin: 13.3 g/dL (ref 13.0–17.0)
Potassium: 3.8 mmol/L (ref 3.5–5.1)
Sodium: 136 mmol/L (ref 135–145)
TCO2: 26 mmol/L (ref 0–100)

## 2016-04-27 LAB — DIFFERENTIAL
BAND NEUTROPHILS: 0 %
BLASTS: 0 %
Basophils Absolute: 0.1 10*3/uL (ref 0.0–0.1)
Basophils Relative: 3 %
EOS ABS: 0.1 10*3/uL (ref 0.0–0.7)
EOS PCT: 4 %
LYMPHS ABS: 0.3 10*3/uL — AB (ref 0.7–4.0)
Lymphocytes Relative: 9 %
METAMYELOCYTES PCT: 0 %
MONOS PCT: 6 %
MYELOCYTES: 0 %
Monocytes Absolute: 0.2 10*3/uL (ref 0.1–1.0)
NEUTROS ABS: 2.9 10*3/uL (ref 1.7–7.7)
NEUTROS PCT: 78 %
OTHER: 0 %
Promyelocytes Absolute: 0 %
nRBC: 0 /100 WBC

## 2016-04-27 LAB — I-STAT TROPONIN, ED: TROPONIN I, POC: 0.01 ng/mL (ref 0.00–0.08)

## 2016-04-27 LAB — URINALYSIS, ROUTINE W REFLEX MICROSCOPIC
Bilirubin Urine: NEGATIVE
Glucose, UA: NEGATIVE mg/dL
Hgb urine dipstick: NEGATIVE
KETONES UR: NEGATIVE mg/dL
LEUKOCYTES UA: NEGATIVE
NITRITE: NEGATIVE
PH: 5.5 (ref 5.0–8.0)
Protein, ur: NEGATIVE mg/dL
SPECIFIC GRAVITY, URINE: 1.02 (ref 1.005–1.030)

## 2016-04-27 LAB — RAPID URINE DRUG SCREEN, HOSP PERFORMED
Amphetamines: NOT DETECTED
Barbiturates: NOT DETECTED
Benzodiazepines: POSITIVE — AB
Cocaine: NOT DETECTED
OPIATES: POSITIVE — AB
TETRAHYDROCANNABINOL: NOT DETECTED

## 2016-04-27 LAB — APTT: aPTT: 40 seconds — ABNORMAL HIGH (ref 24–36)

## 2016-04-27 LAB — PROTIME-INR
INR: 1.1
Prothrombin Time: 14.3 seconds (ref 11.4–15.2)

## 2016-04-27 LAB — ETHANOL: Alcohol, Ethyl (B): <5 mg/dL (ref <5)

## 2016-04-27 MED ORDER — DEXAMETHASONE 4 MG PO TABS: 4.0000 mg | ORAL_TABLET | Freq: Four times a day (QID) | ORAL | 0 refills | Status: DC

## 2016-04-27 MED ORDER — GADOBENATE DIMEGLUMINE 529 MG/ML IV SOLN
19.0000 mL | Freq: Once | INTRAVENOUS | Status: AC | PRN
  Administered 2016-04-27: 19 mL via INTRAVENOUS

## 2016-04-27 MED ORDER — MORPHINE SULFATE (PF) 4 MG/ML IV SOLN
4.0000 mg | Freq: Once | INTRAVENOUS | Status: AC
  Administered 2016-04-27: 4 mg via INTRAVENOUS
  Filled 2016-04-27: qty 1

## 2016-04-27 MED ORDER — IOPAMIDOL (ISOVUE-300) INJECTION 61%
75.0000 mL | Freq: Once | INTRAVENOUS | Status: AC | PRN
  Administered 2016-04-27: 75 mL via INTRAVENOUS

## 2016-04-27 MED ORDER — LORAZEPAM 2 MG/ML IJ SOLN
0.5000 mg | Freq: Once | INTRAMUSCULAR | Status: AC
  Administered 2016-04-27: 0.5 mg via INTRAVENOUS
  Filled 2016-04-27: qty 1

## 2016-04-27 MED ORDER — DEXAMETHASONE SODIUM PHOSPHATE 10 MG/ML IJ SOLN
10.0000 mg | Freq: Once | INTRAMUSCULAR | Status: AC
  Administered 2016-04-27: 10 mg via INTRAVENOUS
  Filled 2016-04-27: qty 1

## 2016-04-27 MED ORDER — OXYCODONE HCL 5 MG PO TABS
10.0000 mg | ORAL_TABLET | Freq: Once | ORAL | Status: AC
Start: 2016-04-27 — End: 2016-04-27
  Administered 2016-04-27: 10 mg via ORAL
  Filled 2016-04-27: qty 2

## 2016-04-27 MED ORDER — OXYCODONE HCL 5 MG PO TABS: 5.0000 mg | ORAL_TABLET | ORAL | 0 refills | Status: DC | PRN

## 2016-04-27 MED ORDER — IOPAMIDOL (ISOVUE-300) INJECTION 61%
INTRAVENOUS | Status: AC
  Filled 2016-04-27: qty 100

## 2016-04-27 NOTE — Telephone Encounter (Signed)
CT Head results reviewed by MD, Per MD, pt instructed to go to ED for evaluation of questionable stroke, stat MRI will be needed and Neuro consult. Called s/w intake RN at [facility] ED to advise pt will be coming in the next 15-20 minutes.

## 2016-04-27 NOTE — ED Provider Notes (Signed)
[facility] DEPT Provider Note   CSN: [id number] Arrival date & time: 04/27/16  1109     History   Chief Complaint Chief Complaint  Patient presents with  . Dizziness  . Headache    HPI [name] is a 75 y.o. male.Complains of frontal headache gradual onset 2 weeks ago. Patient also reports intermittent blurred vision in both eyes. He states his vision was blurred immediately upon checking in at triage area here today however presently his vision is normal. Patient had CT scan of brain earlier today, which showed enhancing lesions in right superior cerebellum and right occipital lobe. MRI scan with and without contrast suggested. No other associated symptoms. Patient presently asymptomatic except for frontal headache. No treatment prior to coming here. Nothing makes symptoms better or worse.  HPI  Past Medical History:  Diagnosis Date  . Adenomatous polyp of colon 1996  . Arthritis    Osteoarthritis right knee, spine, wrist  . Arthritis of hand, right    Right thumb (Dr. Daylene Katayama)  . Back ache    r/o Multiple Myeloma  . Benign prostatic hypertrophy    (Dr. Karsten Ro in past)  . Degenerative disc disease   . Diverticulosis   . DVT (deep venous thrombosis) ([place]) 01/2014   R calf  . Encounter for antineoplastic chemotherapy 10/20/2015  . Erosive esophagitis   . GERD (gastroesophageal reflux disease)   . Hemorrhoids   . Hiatal hernia   . History of chronic prostatitis   . History of melanoma    Back (Dr. Wilhemina Bonito); early melanoma R arm 03/2011  . Multiple myeloma ([place]) 2013   skull lytic lesions--treated with radiation 07/2014  . Pulmonary embolism ([place]) 01/2014  . Radiation 07/21/14-08/05/14   left occipital parietal skull 25 gray  . SCC (squamous cell carcinoma) 08/04/15   right ear;dysplastic nevus with severe atypia-chest    Patient Active Problem List   Diagnosis Date Noted  . Peripheral neuropathy due to chemotherapy ([place]) 02/08/2016  . Multiple myeloma not  having achieved remission ([place]) 10/20/2015  . Encounter for antineoplastic chemotherapy 10/20/2015  . Long term current use of anticoagulant therapy 11/04/2014  . Peripheral edema 11/04/2014  . Knee pain, bilateral 11/04/2014  . Headache 07/14/2014  . Abdominal aortic atherosclerosis ([place]) 04/28/2014  . Acute pulmonary embolism ([place]) 02/07/2014  . Acute deep vein thrombosis (DVT) of tibial vein of right lower extremity ([place]) 02/07/2014  . DVT of leg (deep venous thrombosis) ([place]) 02/06/2014  . Multiple myeloma ([place]) 01/27/2012  . Hyponatremia 01/16/2012  . Anemia 01/16/2012  . Dyspnea 04/18/2011  . Preop cardiovascular exam 04/18/2011  . ABDOMINAL PAIN-RUQ 04/29/2010  . IRRITABLE BOWEL SYNDROME 02/19/2009  . GERD 01/03/2008  . PERSONAL HX COLONIC POLYPS 01/03/2008    Past Surgical History:  Procedure Laterality Date  . APPENDECTOMY    . BONE MARROW TRANSPLANT  03/2013   Stem Cell  . COLONOSCOPY  2009   adenomatous polyp  . ESOPHAGOGASTRODUODENOSCOPY  2009   mild inflammation at esophagogastric junction  . excision of melanoma     back ( x3)  . Great toe surgery     bilateral  . INGUINAL HERNIA REPAIR     left, x 2  . port placemet  10/2012  . TOTAL KNEE ARTHROPLASTY  06/08/2011   Procedure: TOTAL KNEE ARTHROPLASTY;  Surgeon: Ninetta Lights, MD;  Location: [facility];  Service: Orthopedics;  Laterality: Right;  osteonics  . total knee replaced  2000   Left knee  Home Medications    Prior to Admission medications   Medication Sig Start Date End Date Taking? Authorizing Provider  ascorbic acid (VITAMIN C) 500 MG tablet Take 500 mg by mouth 2 (two) times daily.    Yes Historical Provider, MD  cholecalciferol (VITAMIN D) 1000 UNITS tablet Take 1,000 Units by mouth daily.   Yes Historical Provider, MD  desmopressin (DDAVP) 0.1 MG tablet Take 1 tablet (0.1 mg total) by mouth at bedtime. 04/25/16  Yes Rita Ohara, MD  dicyclomine (BENTYL) 10 MG capsule Take 1 capsule (10 mg  total) by mouth 3 (three) times daily before meals. Patient taking differently: Take 10 mg by mouth daily as needed for spasms.  06/25/15  Yes Rita Ohara, MD  fish oil-omega-3 fatty acids 1000 MG capsule Take 1 g by mouth daily.    Yes Historical Provider, MD  furosemide (LASIX) 20 MG tablet Take 1 tablet (20 mg total) by mouth daily. Patient taking differently: Take 20 mg by mouth daily as needed for fluid.  02/29/16  Yes Rita Ohara, MD  lenalidomide (REVLIMID) 10 MG capsule Take 1 capsule (10 mg total) by mouth daily. 04/13/16  Yes Curt Bears, MD  LORazepam (ATIVAN) 1 MG tablet Take 1 tablet (1 mg total) by mouth every 8 (eight) hours as needed for anxiety. 12/08/15  Yes Curt Bears, MD  methocarbamol (ROBAXIN) 500 MG tablet TAKE 1 TO 2 TABLETS BY MOUTH EVERY 6 HOURS AS NEEDED FOR MUSCLE SPASMS 12/09/15  Yes Rita Ohara, MD  Multiple Vitamin (MULTIVITAMIN WITH MINERALS) TABS Take 1 tablet by mouth daily.   Yes Historical Provider, MD  oxyCODONE-acetaminophen (PERCOCET/ROXICET) 5-325 MG tablet Take 1 tablet by mouth every 4 (four) hours as needed. 03/29/16  Yes Curt Bears, MD  pantoprazole (PROTONIX) 40 MG tablet Take 1 tablet by mouth two  times daily 11/30/15  Yes Curt Bears, MD  Probiotic Product (ALIGN) 4 MG CAPS Take 1 capsule by mouth daily. Reported on 09/10/2015   Yes Historical Provider, MD  temazepam (RESTORIL) 15 MG capsule Take 1 capsule (15 mg total) by mouth at bedtime as needed. 04/08/16  Yes Curt Bears, MD  traMADol (ULTRAM) 50 MG tablet Take 1-2 tablets (50-100 mg total) by mouth every 8 (eight) hours as needed for severe pain. 04/21/16  Yes Rita Ohara, MD  XARELTO 20 MG TABS tablet Take 1 tablet by mouth  daily 02/15/16  Yes Curt Bears, MD  zolendronic acid (ZOMETA) 4 MG/5ML injection Inject 4 mg into the vein every 28 (twenty-eight) days.   Yes Historical Provider, MD  mirabegron ER (MYRBETRIQ) 25 MG TB24 tablet Take 1 tablet (25 mg total) by mouth daily. Patient not  taking: Reported on 04/27/2016 04/21/16   Rita Ohara, MD  prochlorperazine (COMPAZINE) 10 MG tablet Take 1 tablet (10 mg total) by mouth every 6 (six) hours as needed. Patient not taking: Reported on 04/27/2016 04/16/14   Curt Bears, MD    Family History Family History  Problem Relation Age of Onset  . Dementia Mother   . Stroke Father     brainstem stroke  . Hypertension Father   . Diabetes Father   . Prostate cancer Maternal Grandfather   . Depression Daughter   . Anesthesia problems Neg Hx     Social History Social History  Substance Use Topics  . Smoking status: Former Smoker    Packs/day: 3.00    Years: 10.00    Types: Cigarettes    Quit date: 07/11/1976  . Smokeless tobacco: Never  Used     Comment: 3 PPD x 6 years  . Alcohol use 1.5 oz/week    3 Standard drinks or equivalent per week     Comment: 2 glasses of wine daily     Allergies   Amoxicillin and Penicillins   Review of Systems Review of Systems  Constitutional: Negative.   Eyes: Positive for visual disturbance.  Respiratory: Negative.   Cardiovascular: Negative.   Gastrointestinal: Negative.   Musculoskeletal: Negative.   Skin: Negative.   Allergic/Immunologic: Positive for immunocompromised state.       Currently treated for multiple myeloma  Neurological: Positive for dizziness and headaches.       Mild lightheadedness  Psychiatric/Behavioral: Negative.   All other systems reviewed and are negative.    Physical Exam Updated Vital Signs BP 129/82 (BP Location: Left Arm)   Pulse 62   Temp 98.1 F (36.7 C) (Oral)   Resp 16   SpO2 100%   Physical Exam  Constitutional: He is oriented to person, place, and time. He appears well-developed and well-nourished.  HENT:  Head: Normocephalic and atraumatic.  Eyes: Conjunctivae are normal. Pupils are equal, round, and reactive to light.  Fundi not well visualized  Neck: Neck supple. No tracheal deviation present. No thyromegaly present.    Cardiovascular: Normal rate and regular rhythm.   No murmur heard. Pulmonary/Chest: Effort normal and breath sounds normal.  Abdominal: Soft. Bowel sounds are normal. He exhibits no distension. There is no tenderness.  Musculoskeletal: Normal range of motion. He exhibits no edema or tenderness.  Neurological: He is alert and oriented to person, place, and time. He has normal reflexes. A cranial nerve deficit is present. Coordination normal.  Gait normal Romberg normal pronator drift normal DTR symmetric bilaterally at knee jerk ankle jerk and biceps toes down going bilaterally  Skin: Skin is warm and dry. No rash noted.  Psychiatric: He has a normal mood and affect.  Nursing note and vitals reviewed.    ED Treatments / Results  Labs (all labs ordered are listed, but only abnormal results are displayed) Labs Reviewed  ETHANOL  PROTIME-INR  APTT  CBC  DIFFERENTIAL  COMPREHENSIVE METABOLIC PANEL  RAPID URINE DRUG SCREEN, HOSP PERFORMED  URINALYSIS, ROUTINE W REFLEX MICROSCOPIC (NOT AT [facility])  I-STAT CHEM 8, ED  I-STAT TROPOININ, ED   Results for orders placed or performed during the hospital encounter of 04/27/16  Ethanol  Result Value Ref Range   Alcohol, Ethyl (B) <5 <5 mg/dL  Protime-INR  Result Value Ref Range   Prothrombin Time 14.3 11.4 - 15.2 seconds   INR 1.10   APTT  Result Value Ref Range   aPTT 40 (H) 24 - 36 seconds  CBC  Result Value Ref Range   WBC 3.6 (L) 4.0 - 10.5 K/uL   RBC 4.09 (L) 4.22 - 5.81 MIL/uL   Hemoglobin 12.8 (L) 13.0 - 17.0 g/dL   HCT 37.6 (L) 39.0 - 52.0 %   MCV 91.9 78.0 - 100.0 fL   MCH 31.3 26.0 - 34.0 pg   MCHC 34.0 30.0 - 36.0 g/dL   RDW 14.5 11.5 - 15.5 %   Platelets 150 150 - 400 K/uL  Differential  Result Value Ref Range   Neutrophils Relative % 78 %   Lymphocytes Relative 9 %   Monocytes Relative 6 %   Eosinophils Relative 4 %   Basophils Relative 3 %   Band Neutrophils 0 %   Metamyelocytes Relative 0 %   Myelocytes  0 %    Promyelocytes Absolute 0 %   Blasts 0 %   nRBC 0 0 /100 WBC   Other 0 %   Neutro Abs 2.9 1.7 - 7.7 K/uL   Lymphs Abs 0.3 (L) 0.7 - 4.0 K/uL   Monocytes Absolute 0.2 0.1 - 1.0 K/uL   Eosinophils Absolute 0.1 0.0 - 0.7 K/uL   Basophils Absolute 0.1 0.0 - 0.1 K/uL   Smear Review MORPHOLOGY UNREMARKABLE   Comprehensive metabolic panel  Result Value Ref Range   Sodium 134 (L) 135 - 145 mmol/L   Potassium 3.7 3.5 - 5.1 mmol/L   Chloride 105 101 - 111 mmol/L   CO2 24 22 - 32 mmol/L   Glucose, Bld 93 65 - 99 mg/dL   BUN 10 6 - 20 mg/dL   Creatinine, Ser 0.98 0.61 - 1.24 mg/dL   Calcium 8.4 (L) 8.9 - 10.3 mg/dL   Total Protein 6.0 (L) 6.5 - 8.1 g/dL   Albumin 3.1 (L) 3.5 - 5.0 g/dL   AST 30 15 - 41 U/L   ALT 31 17 - 63 U/L   Alkaline Phosphatase 47 38 - 126 U/L   Total Bilirubin 0.4 0.3 - 1.2 mg/dL   GFR calc non Af Amer >60 >60 mL/min   GFR calc Af Amer >60 >60 mL/min   Anion gap 5 5 - 15  Urine rapid drug screen (hosp performed)not at [facility]  Result Value Ref Range   Opiates POSITIVE (A) NONE DETECTED   Cocaine NONE DETECTED NONE DETECTED   Benzodiazepines POSITIVE (A) NONE DETECTED   Amphetamines NONE DETECTED NONE DETECTED   Tetrahydrocannabinol NONE DETECTED NONE DETECTED   Barbiturates NONE DETECTED NONE DETECTED  Urinalysis, Routine w reflex microscopic (not at [facility])  Result Value Ref Range   Color, Urine YELLOW YELLOW   APPearance CLEAR CLEAR   Specific Gravity, Urine 1.020 1.005 - 1.030   pH 5.5 5.0 - 8.0   Glucose, UA NEGATIVE NEGATIVE mg/dL   Hgb urine dipstick NEGATIVE NEGATIVE   Bilirubin Urine NEGATIVE NEGATIVE   Ketones, ur NEGATIVE NEGATIVE mg/dL   Protein, ur NEGATIVE NEGATIVE mg/dL   Nitrite NEGATIVE NEGATIVE   Leukocytes, UA NEGATIVE NEGATIVE  I-Stat Chem 8, ED  (not at [facility], [facility])  Result Value Ref Range   Sodium 136 135 - 145 mmol/L   Potassium 3.8 3.5 - 5.1 mmol/L   Chloride 100 (L) 101 - 111 mmol/L   BUN 11 6 - 20 mg/dL   Creatinine, Ser 1.00 0.61  - 1.24 mg/dL   Glucose, Bld 93 65 - 99 mg/dL   Calcium, Ion 1.10 (L) 1.15 - 1.40 mmol/L   TCO2 26 0 - 100 mmol/L   Hemoglobin 13.3 13.0 - 17.0 g/dL   HCT 39.0 39.0 - 52.0 %  I-stat troponin, ED (not at [facility], [facility])  Result Value Ref Range   Troponin i, poc 0.01 0.00 - 0.08 ng/mL   Comment 3           *Note: Due to a large number of results and/or encounters for the requested time period, some results have not been displayed. A complete set of results can be found in Results Review.   Ct Head W Wo Contrast  Result Date: 04/27/2016 CLINICAL DATA:  Daily headaches. History of lytic skull lesion and posterior calvarial radiation 2016. History of stem cell transplant in 2014. EXAM: CT HEAD WITHOUT AND WITH CONTRAST TECHNIQUE: Contiguous axial images were obtained from the base of the skull  through the vertex without and with intravenous contrast CONTRAST:  1m ISOVUE-300 IOPAMIDOL intravenous COMPARISON:  04/16/2015 FINDINGS: Brain: Ovoid area of ring enhancement superior right cerebellum measuring up to 16 mm. There is a 6 mm focus of nodular enhancement in the parasagittal right occipital lobe. No evidence of acute infarct, hemorrhage, or hydrocephalus. Vascular: Major vessels are patent. Skull: Stable.  No progressed lucency.  No table erosion. Sinuses/Orbits: Bilateral cataract resection. Other: These results will be called to the ordering clinician or representative by the Radiologist Assistant, and communication documented in the PACS or zVision Dashboard. IMPRESSION: 1. Enhancing lesions in the right superior cerebellum and right occipital lobe, measuring up to 16 mm in the cerebellum. Metastasis (especially from patient's history of melanoma), subacute infarct, or radiation necrosis (history of posterior skull radiotherapy) are differential considerations. If no contraindication to MRI, recommend enhanced MRI follow-up. If subacute infarct is a clinical possibility, delaying the scan for 6 weeks  would allow infarct related enhancement to subside. 2. Stable calvarium. Electronically Signed   By: JMonte FantasiaM.D.   On: 04/27/2016 10:27   Mr BJeri CosAnd Wo Contrast  Result Date: 04/27/2016 CLINICAL DATA:  History of multiple myeloma. Dizziness. Abnormal CT head EXAM: MRI HEAD WITHOUT AND WITH CONTRAST TECHNIQUE: Multiplanar, multiecho pulse sequences of the brain and surrounding structures were obtained without and with intravenous contrast. CONTRAST:  165mMULTIHANCE GADOBENATE DIMEGLUMINE 529 MG/ML IV SOLN COMPARISON:  CT head 04/27/2016, MRI 07/31/2013 FINDINGS: Brain: Multiple enhancing lesions are present in the brain compatible with metastatic disease. The largest lesion in the right superior cerebellum measures 16 x 12 mm with surrounding edema. Right parietal operculum lesion measures 8 x 9 mm. Right occipital lobe lesion measures 5 mm Small bilateral cerebellar enhancing lesions 2 mm each. Abnormal pituitary. Pituitary is enlarged and shows heterogeneous enhancement. There is also thickening and enhancement of the infundibulum diffusely. These findings were not present on the prior MRI 07/31/2013. Currently the pituitary measures 10 mm and height. Cavernous sinus normal. No compression of the optic chiasm. Generalized atrophy. Negative for hydrocephalus. Negative for acute infarct. No significant chronic ischemic change. No intracranial hemorrhage. Vascular: Normal arterial flow void. Skull and upper cervical spine: Negative Sinuses/Orbits: Mucosal edema in the paranasal sinuses. No orbital mass. Bilateral lens replacement. Other: None IMPRESSION: Multiple enhancing mass lesions in the brain most consistent with metastatic disease. Mild edema in the right cerebellum and right parietal operculum lesion. No shift of the midline structures. Abnormal pituitary which shows heterogeneous enhancement and is mildly enlarged measuring 10 mm. Thickening and enhancement of the infundibulum. Differential  includes metastatic disease to the pituitary. Other possibilities include lymphocytic hypophysitis, sarcoid, pituitary adenoma. Electronically Signed   By: ChFranchot Gallo.D.   On: 04/27/2016 15:27   EKG  EKG Interpretation  Date/Time:  Wednesday April 27 2016 12:30:03 EDT Ventricular Rate:  54 PR Interval:    QRS Duration: 125 QT Interval:  502 QTC Calculation: 476 R Axis:   8 Text Interpretation:  Sinus rhythm Prolonged PR interval Probable left atrial enlargement IVCD, consider atypical RBBB No significant change since last tracing Confirmed by JAWinfred LeedsMD, Marlyss Cissell ([phone] 04/27/2016 12:45:47 PM       Radiology Ct Head W Wo Contrast  Result Date: 04/27/2016 CLINICAL DATA:  Daily headaches. History of lytic skull lesion and posterior calvarial radiation 2016. History of stem cell transplant in 2014. EXAM: CT HEAD WITHOUT AND WITH CONTRAST TECHNIQUE: Contiguous axial images were obtained from the base  of the skull through the vertex without and with intravenous contrast CONTRAST:  19m ISOVUE-300 IOPAMIDOL intravenous COMPARISON:  04/16/2015 FINDINGS: Brain: Ovoid area of ring enhancement superior right cerebellum measuring up to 16 mm. There is a 6 mm focus of nodular enhancement in the parasagittal right occipital lobe. No evidence of acute infarct, hemorrhage, or hydrocephalus. Vascular: Major vessels are patent. Skull: Stable.  No progressed lucency.  No table erosion. Sinuses/Orbits: Bilateral cataract resection. Other: These results will be called to the ordering clinician or representative by the Radiologist Assistant, and communication documented in the PACS or zVision Dashboard. IMPRESSION: 1. Enhancing lesions in the right superior cerebellum and right occipital lobe, measuring up to 16 mm in the cerebellum. Metastasis (especially from patient's history of melanoma), subacute infarct, or radiation necrosis (history of posterior skull radiotherapy) are differential considerations.  If no contraindication to MRI, recommend enhanced MRI follow-up. If subacute infarct is a clinical possibility, delaying the scan for 6 weeks would allow infarct related enhancement to subside. 2. Stable calvarium. Electronically Signed   By: JMonte FantasiaM.D.   On: 04/27/2016 10:27    Procedures Procedures (including critical care time)  Medications Ordered in ED Medications  morphine 4 MG/ML injection 4 mg (not administered)  LORazepam (ATIVAN) injection 0.5 mg (not administered)   Results for orders placed or performed during the hospital encounter of 04/27/16  Ethanol  Result Value Ref Range   Alcohol, Ethyl (B) <5 <5 mg/dL  Protime-INR  Result Value Ref Range   Prothrombin Time 14.3 11.4 - 15.2 seconds   INR 1.10   APTT  Result Value Ref Range   aPTT 40 (H) 24 - 36 seconds  CBC  Result Value Ref Range   WBC 3.6 (L) 4.0 - 10.5 K/uL   RBC 4.09 (L) 4.22 - 5.81 MIL/uL   Hemoglobin 12.8 (L) 13.0 - 17.0 g/dL   HCT 37.6 (L) 39.0 - 52.0 %   MCV 91.9 78.0 - 100.0 fL   MCH 31.3 26.0 - 34.0 pg   MCHC 34.0 30.0 - 36.0 g/dL   RDW 14.5 11.5 - 15.5 %   Platelets 150 150 - 400 K/uL  Differential  Result Value Ref Range   Neutrophils Relative % 78 %   Lymphocytes Relative 9 %   Monocytes Relative 6 %   Eosinophils Relative 4 %   Basophils Relative 3 %   Band Neutrophils 0 %   Metamyelocytes Relative 0 %   Myelocytes 0 %   Promyelocytes Absolute 0 %   Blasts 0 %   nRBC 0 0 /100 WBC   Other 0 %   Neutro Abs 2.9 1.7 - 7.7 K/uL   Lymphs Abs 0.3 (L) 0.7 - 4.0 K/uL   Monocytes Absolute 0.2 0.1 - 1.0 K/uL   Eosinophils Absolute 0.1 0.0 - 0.7 K/uL   Basophils Absolute 0.1 0.0 - 0.1 K/uL   Smear Review MORPHOLOGY UNREMARKABLE   Comprehensive metabolic panel  Result Value Ref Range   Sodium 134 (L) 135 - 145 mmol/L   Potassium 3.7 3.5 - 5.1 mmol/L   Chloride 105 101 - 111 mmol/L   CO2 24 22 - 32 mmol/L   Glucose, Bld 93 65 - 99 mg/dL   BUN 10 6 - 20 mg/dL   Creatinine, Ser  0.98 0.61 - 1.24 mg/dL   Calcium 8.4 (L) 8.9 - 10.3 mg/dL   Total Protein 6.0 (L) 6.5 - 8.1 g/dL   Albumin 3.1 (L) 3.5 - 5.0 g/dL  AST 30 15 - 41 U/L   ALT 31 17 - 63 U/L   Alkaline Phosphatase 47 38 - 126 U/L   Total Bilirubin 0.4 0.3 - 1.2 mg/dL   GFR calc non Af Amer >60 >60 mL/min   GFR calc Af Amer >60 >60 mL/min   Anion gap 5 5 - 15  Urine rapid drug screen (hosp performed)not at [facility]  Result Value Ref Range   Opiates POSITIVE (A) NONE DETECTED   Cocaine NONE DETECTED NONE DETECTED   Benzodiazepines POSITIVE (A) NONE DETECTED   Amphetamines NONE DETECTED NONE DETECTED   Tetrahydrocannabinol NONE DETECTED NONE DETECTED   Barbiturates NONE DETECTED NONE DETECTED  Urinalysis, Routine w reflex microscopic (not at [facility])  Result Value Ref Range   Color, Urine YELLOW YELLOW   APPearance CLEAR CLEAR   Specific Gravity, Urine 1.020 1.005 - 1.030   pH 5.5 5.0 - 8.0   Glucose, UA NEGATIVE NEGATIVE mg/dL   Hgb urine dipstick NEGATIVE NEGATIVE   Bilirubin Urine NEGATIVE NEGATIVE   Ketones, ur NEGATIVE NEGATIVE mg/dL   Protein, ur NEGATIVE NEGATIVE mg/dL   Nitrite NEGATIVE NEGATIVE   Leukocytes, UA NEGATIVE NEGATIVE  I-Stat Chem 8, ED  (not at [facility], [facility])  Result Value Ref Range   Sodium 136 135 - 145 mmol/L   Potassium 3.8 3.5 - 5.1 mmol/L   Chloride 100 (L) 101 - 111 mmol/L   BUN 11 6 - 20 mg/dL   Creatinine, Ser 1.00 0.61 - 1.24 mg/dL   Glucose, Bld 93 65 - 99 mg/dL   Calcium, Ion 1.10 (L) 1.15 - 1.40 mmol/L   TCO2 26 0 - 100 mmol/L   Hemoglobin 13.3 13.0 - 17.0 g/dL   HCT 39.0 39.0 - 52.0 %  I-stat troponin, ED (not at [facility], [facility])  Result Value Ref Range   Troponin i, poc 0.01 0.00 - 0.08 ng/mL   Comment 3           *Note: Due to a large number of results and/or encounters for the requested time period, some results have not been displayed. A complete set of results can be found in Results Review.   Ct Head W Wo Contrast  Result Date: 04/27/2016 CLINICAL DATA:  Daily  headaches. History of lytic skull lesion and posterior calvarial radiation 2016. History of stem cell transplant in 2014. EXAM: CT HEAD WITHOUT AND WITH CONTRAST TECHNIQUE: Contiguous axial images were obtained from the base of the skull through the vertex without and with intravenous contrast CONTRAST:  80m ISOVUE-300 IOPAMIDOL intravenous COMPARISON:  04/16/2015 FINDINGS: Brain: Ovoid area of ring enhancement superior right cerebellum measuring up to 16 mm. There is a 6 mm focus of nodular enhancement in the parasagittal right occipital lobe. No evidence of acute infarct, hemorrhage, or hydrocephalus. Vascular: Major vessels are patent. Skull: Stable.  No progressed lucency.  No table erosion. Sinuses/Orbits: Bilateral cataract resection. Other: These results will be called to the ordering clinician or representative by the Radiologist Assistant, and communication documented in the PACS or zVision Dashboard. IMPRESSION: 1. Enhancing lesions in the right superior cerebellum and right occipital lobe, measuring up to 16 mm in the cerebellum. Metastasis (especially from patient's history of melanoma), subacute infarct, or radiation necrosis (history of posterior skull radiotherapy) are differential considerations. If no contraindication to MRI, recommend enhanced MRI follow-up. If subacute infarct is a clinical possibility, delaying the scan for 6 weeks would allow infarct related enhancement to subside. 2. Stable calvarium. Electronically Signed   By:  Monte Fantasia M.D.   On: 04/27/2016 10:27   Mr Jeri Cos And Wo Contrast  Result Date: 04/27/2016 CLINICAL DATA:  History of multiple myeloma. Dizziness. Abnormal CT head EXAM: MRI HEAD WITHOUT AND WITH CONTRAST TECHNIQUE: Multiplanar, multiecho pulse sequences of the brain and surrounding structures were obtained without and with intravenous contrast. CONTRAST:  39m MULTIHANCE GADOBENATE DIMEGLUMINE 529 MG/ML IV SOLN COMPARISON:  CT head 04/27/2016, MRI  07/31/2013 FINDINGS: Brain: Multiple enhancing lesions are present in the brain compatible with metastatic disease. The largest lesion in the right superior cerebellum measures 16 x 12 mm with surrounding edema. Right parietal operculum lesion measures 8 x 9 mm. Right occipital lobe lesion measures 5 mm Small bilateral cerebellar enhancing lesions 2 mm each. Abnormal pituitary. Pituitary is enlarged and shows heterogeneous enhancement. There is also thickening and enhancement of the infundibulum diffusely. These findings were not present on the prior MRI 07/31/2013. Currently the pituitary measures 10 mm and height. Cavernous sinus normal. No compression of the optic chiasm. Generalized atrophy. Negative for hydrocephalus. Negative for acute infarct. No significant chronic ischemic change. No intracranial hemorrhage. Vascular: Normal arterial flow void. Skull and upper cervical spine: Negative Sinuses/Orbits: Mucosal edema in the paranasal sinuses. No orbital mass. Bilateral lens replacement. Other: None IMPRESSION: Multiple enhancing mass lesions in the brain most consistent with metastatic disease. Mild edema in the right cerebellum and right parietal operculum lesion. No shift of the midline structures. Abnormal pituitary which shows heterogeneous enhancement and is mildly enlarged measuring 10 mm. Thickening and enhancement of the infundibulum. Differential includes metastatic disease to the pituitary. Other possibilities include lymphocytic hypophysitis, sarcoid, pituitary adenoma. Electronically Signed   By: CFranchot GalloM.D.   On: 04/27/2016 15:27    Initial Impression / Assessment and Plan / ED Course  I have reviewed the triage vital signs and the nursing notes.  Pertinent labs & imaging results that were available during my care of the patient were reviewed by me and considered in my medical decision making (see chart for details).  Clinical Course    I spoke with Dr.Botero who reviewed the  patient's head CT scan and patient does not have an operative lesion. I also consulted Dr.Muhamad, oncologist bytelephone who agrees with Decadron 10 mg IV for brain edema and requests prescription for Decadron 4 mg every 6 hours for one week. He will follow up with patient within a week. Patientsigned out to Dr IEllender Hose520 pm Patient will require further imaging to determine if metastatic lesion. Final Clinical Impressions(s) / ED Diagnoses  Diagnosis #1 headache  Final diagnoses:  None    New Prescriptions New Prescriptions   No medications on file     SOrlie Dakin MD 04/27/16 1730

## 2016-04-27 NOTE — ED Notes (Signed)
Pt back from CT - unable to have scans done d/t receiving contrast earlier this morning. MD made aware and to update pt.

## 2016-04-27 NOTE — ED Notes (Signed)
Pt verbalized understanding discharge instructions and denies any further needs or questions at this time. VS stable, ambulatory and steady gait.   

## 2016-04-27 NOTE — ED Notes (Signed)
Pt transported to CT ?

## 2016-04-27 NOTE — ED Notes (Signed)
Pt leaving for MRI.  

## 2016-04-27 NOTE — ED Notes (Signed)
Pt just told me he has had numbness on the edge of pinky finger of his right hand. Per pt "been there a couple days"

## 2016-04-27 NOTE — ED Triage Notes (Signed)
On going dizziness  Off and on since yesterday, and really bad h/a x 2 weeks, had ct scan today and was called by his ca dr  And  told to come to er to rule out lesions or stroke, having blurred vison in the last hour

## 2016-04-27 NOTE — ED Notes (Signed)
MD at bedside. 

## 2016-04-27 NOTE — ED Notes (Signed)
Meal given

## 2016-04-27 NOTE — ED Provider Notes (Signed)
Assumed care from Dr. Winfred Leeds at 5 PM. Briefly, the patient is a 75 yo male with PMHx of multiple myeloma here with HA. Found to have multiple brain mets with vasogenic edema. AOx4 with normal neuro exam. Decadron given. Dr. [name], pt's oncologist, aware. Initial plan at time of sign out was to obtain ct c/a/p for cancer eval but pt unable to get second dose of contrast per Radiology recommendations. Discussed with Dr. [name], who recommends d/c with decadron 4 mg q6hr and he will f/u in clinic for further work-up. Pt notified and in agreement. He is aware of CT/MRI findings. Will d/c home.   Clinical Impression: 1. Brain metastases ([facility])   2. Cerebral edema ([facility])     Disposition: Discharge  Condition: Good  I have discussed the results, Dx and Tx plan with the pt(& family if present). He/she/they expressed understanding and agree(s) with the plan. Discharge instructions discussed at great length. Strict return precautions discussed and pt &/or family have verbalized understanding of the instructions. No further questions at time of discharge.    Discharge Medication List as of 04/27/2016  6:14 PM    START taking these medications   Details  dexamethasone (DECADRON) 4 MG tablet Take 1 tablet (4 mg total) by mouth every 6 (six) hours., Starting Wed 04/27/2016, Until Wed 05/04/2016, Print    oxyCODONE (ROXICODONE) 5 MG immediate release tablet Take 1-3 tablets (5-15 mg total) by mouth every 4 (four) hours as needed for severe pain (Take 5 mg for mild pain, 10 mg for moderate pain, 15 mg for severe pain)., Starting Wed 04/27/2016, Print        Follow Up: Rita Ohara, MD [place] [address], [place] [address] [phone]   The office will call you to set up an appointment this Friday     Duffy Bruce, MD 04/28/16 0157

## 2016-04-28 ENCOUNTER — Other Ambulatory Visit: Payer: Self-pay | Admitting: *Deleted

## 2016-04-28 ENCOUNTER — Telehealth: Payer: Self-pay | Admitting: *Deleted

## 2016-04-28 ENCOUNTER — Ambulatory Visit (HOSPITAL_COMMUNITY)
Admission: RE | Admit: 2016-04-28 | Discharge: 2016-04-28 | Disposition: A | Discharge status: Home / Self Care | Payer: Medicare Other | Source: Ambulatory Visit | Attending: Internal Medicine | Admitting: Internal Medicine

## 2016-04-28 DIAGNOSIS — C710 Malignant neoplasm of cerebrum, except lobes and ventricles: Secondary | ICD-10-CM | POA: Diagnosis not present

## 2016-04-28 DIAGNOSIS — Z8579 Personal history of other malignant neoplasms of lymphoid, hematopoietic and related tissues: Secondary | ICD-10-CM | POA: Diagnosis not present

## 2016-04-28 DIAGNOSIS — C799 Secondary malignant neoplasm of unspecified site: Secondary | ICD-10-CM

## 2016-04-28 DIAGNOSIS — I700 Atherosclerosis of aorta: Secondary | ICD-10-CM | POA: Diagnosis not present

## 2016-04-28 DIAGNOSIS — C7931 Secondary malignant neoplasm of brain: Secondary | ICD-10-CM | POA: Diagnosis not present

## 2016-04-28 MED ORDER — IOPAMIDOL (ISOVUE-370) INJECTION 76%: 100.0000 mL | Freq: Once | INTRAVENOUS | Status: DC | PRN

## 2016-04-28 MED ORDER — IOPAMIDOL (ISOVUE-300) INJECTION 61%
30.0000 mL | Freq: Once | INTRAVENOUS | Status: AC | PRN
  Administered 2016-04-28: 30 mL via ORAL

## 2016-04-28 MED ORDER — IOPAMIDOL (ISOVUE-300) INJECTION 61%
100.0000 mL | Freq: Once | INTRAVENOUS | Status: AC | PRN
  Administered 2016-04-28: 100 mL via INTRAVENOUS

## 2016-04-28 NOTE — Telephone Encounter (Signed)
Per MD, stat CT CAP ordered, scheduled for 3pm, scan has been authorized, pt notified will arrive to radiology at 1pm to drink contrast. Pt understands nothing to eat drink after 11am. Discussed with pt he will be notified with results.

## 2016-04-29 ENCOUNTER — Telehealth: Payer: Self-pay | Admitting: Internal Medicine

## 2016-04-29 ENCOUNTER — Telehealth: Payer: Self-pay | Admitting: *Deleted

## 2016-04-29 ENCOUNTER — Ambulatory Visit (HOSPITAL_BASED_OUTPATIENT_CLINIC_OR_DEPARTMENT_OTHER): Payer: Medicare Other | Admitting: Internal Medicine

## 2016-04-29 ENCOUNTER — Other Ambulatory Visit: Payer: Self-pay | Admitting: *Deleted

## 2016-04-29 ENCOUNTER — Encounter: Payer: Self-pay | Admitting: *Deleted

## 2016-04-29 ENCOUNTER — Encounter: Payer: Self-pay | Admitting: Internal Medicine

## 2016-04-29 VITALS — BP 130/76 | HR 65 | Temp 98.0°F | Resp 18 | Ht 69.25 in | Wt 216.6 lb

## 2016-04-29 DIAGNOSIS — M549 Dorsalgia, unspecified: Secondary | ICD-10-CM | POA: Diagnosis not present

## 2016-04-29 DIAGNOSIS — C7931 Secondary malignant neoplasm of brain: Secondary | ICD-10-CM

## 2016-04-29 DIAGNOSIS — G620 Drug-induced polyneuropathy: Secondary | ICD-10-CM | POA: Diagnosis not present

## 2016-04-29 DIAGNOSIS — C801 Malignant (primary) neoplasm, unspecified: Secondary | ICD-10-CM

## 2016-04-29 DIAGNOSIS — C9000 Multiple myeloma not having achieved remission: Secondary | ICD-10-CM

## 2016-04-29 DIAGNOSIS — G4700 Insomnia, unspecified: Secondary | ICD-10-CM

## 2016-04-29 DIAGNOSIS — R350 Frequency of micturition: Secondary | ICD-10-CM

## 2016-04-29 MED ORDER — PROCHLORPERAZINE MALEATE 10 MG PO TABS: 10.0000 mg | ORAL_TABLET | Freq: Four times a day (QID) | ORAL | 1 refills | Status: AC | PRN

## 2016-04-29 MED ORDER — DEXAMETHASONE 4 MG PO TABS: 4.0000 mg | ORAL_TABLET | Freq: Four times a day (QID) | ORAL | 0 refills | Status: AC

## 2016-04-29 NOTE — Progress Notes (Signed)
Oncology Nurse Navigator Documentation  Oncology Nurse Navigator Flowsheets 04/29/2016  Navigator Encounter Type Clinic/MDC  Patient Visit Type MedOnc/I met [name] and his wife today.  He has new imaging that shows concern for possible lung cancer.  Per Dr. Worthy Flank request, he would like patient to have PET scan and referral to T surgery for tissue DX.  I have contacted managed care for per authorization on PET scan.  I will make referral to T surgery.   Treatment Phase Abnormal Scans  Barriers/Navigation Needs Coordination of Care  Interventions Coordination of Care  Coordination of Care Appts;Other  Acuity Level 2  Acuity Level 2 Assistance expediting appointments;Other  Time Spent with Patient 30

## 2016-04-29 NOTE — Telephone Encounter (Signed)
I called Mr. [name] and gave him appt for his PET scan and pre-procedure instruction.  He verbalized understanding of appt time and place.

## 2016-04-29 NOTE — Progress Notes (Signed)
[name] [phone]   [phone]  OFFICE PROGRESS NOTE  [place], [place] [address] 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 2) Stage IIA multiple myeloma diagnosed in July of 2013.   PRIOR THERAPY:  1) Systemic chemotherapy with subcutaneous Velcade 1.3 mg/M2 on days 1, 4, 8 and 11 every 3 weeks in addition to Revlimid 25 mg by mouth daily for 14 days every 3 weeks and dexamethasone 40 mg on a weekly basis. Status post 4 planned cycles.  2) Systemic chemotherapy with subcutaneous Velcade 1.3 mg/M2 on days 1, 4, 8 and 11 every 3 weeks in addition to Revlimid 25 mg by mouth daily for 14 days every 3 weeks and dexamethasone 40 mg on a weekly basis. 4 more cycles are planned and he is status post 4 cycles of the second set of 4 cycles, now status post a total of 10 cycles. 3) Systemic chemotherapy with Carfilzomib 36 mg/M2 on days 1, 2, 8, 9, 15 and 16 in addition to cyclophosphamide 300 mg/M2 IV on days 1, 8 and 15 as well as dexamethasone on days 1, 8, 15 and 22 every 4 weeks. Status post 4 cycles. 4) Zometa 4 mg IV every month. 5) autologous peripheral blood stem cell transplant on 03/20/2013 at [facility]. 6) Consolidation chemotherapy with Carfilzomib 36 mg/M2 on days 1, 2, 8, 9, 15 and 16 every 4 weeks in addition to cyclophosphamide 300 mg/M2 on days 1, 8 and 15 as well as dexamethasone 40 mg on a weekly basis every 4 weeks. Status post 2 cycles. First cycle 06/11/2013. 7) Maintenance therapy with Revlimid 10 mg by mouth daily status post 3 months of treatment. 8) palliative radiotherapy to the lytic lesion in the skull under the care of Dr. Sondra Come 9) Maintenance therapy with Revlimid 15 mg by mouth daily started 11/09/2013 and discontinued 02/18/2015 secondary to persistent neutropenia.   CURRENT  THERAPY:  1) Maintenance therapy with Revlimid 10 mg by mouth daily started 02/19/2015. currently on hold starting today.  2) Zometa 4 mg IV every 3 months. 3) Xarelto 20 mg by mouth daily for pulmonary embolism.   INTERVAL HISTORY: [name] 75 y.o. male returns to the clinic today for routine monthly followup visit. Over the last week or so the patient has been complaining of increasing headache as well as polydipsia and polyuria. He was seen by his primary care physician. For the persistent headache her ordered CT scan of the head which was performed on 04/27/2016 and it showed enhancing lesions in the right superior cerebellum and right occipital lobe measuring up to 16 mm in the cerebellum. Metastasis, subacute infarct or radiation necrosis were considerations. The patient was seen at the emergency Department and MRI of the brain was performed on the same day and it showed multiple enhancing mass lesions and the pain most consistent with metastatic disease. There was mild edema in the right cerebellum and right parietal operculum lesion but no shift of the midline structures. There was also abnormal pituitary which shows heterogeneous enhancement and mildly enlarged measuring 1.0 cm. I ordered CT scan of the chest, abdomen and pelvis which were performed on 04/28/2016 and it showed right paratracheal lymph node measuring 1.2 cm, right hilar node measuring 1.8 cm, subcarinal node measuring 1.5 cm and right hilar lymph node measuring 1.7 cm. There was also small nodule  in the left upper lobe measuring 5 mm. The patient came to the clinic today for further evaluation and recommendation regarding these abnormalities. He was started on Decadron 4 mg by mouth every 6 hours and feeling a little bit better with less headache. He denied having any fever or chills. He has no significant weight loss or night sweats. He denied having any significant chest pain, shortness of breath, cough or  hemoptysis.   MEDICAL HISTORY: Past Medical History:  Diagnosis Date  . Adenomatous polyp of colon 1996  . Arthritis    Osteoarthritis right knee, spine, wrist  . Arthritis of hand, right    Right thumb (Dr. Daylene Katayama)  . Back ache    r/o Multiple Myeloma  . Benign prostatic hypertrophy    (Dr. Karsten Ro in past)  . Degenerative disc disease   . Diverticulosis   . DVT (deep venous thrombosis) ([facility]) 01/2014   R calf  . Encounter for antineoplastic chemotherapy 10/20/2015  . Erosive esophagitis   . GERD (gastroesophageal reflux disease)   . Hemorrhoids   . Hiatal hernia   . History of chronic prostatitis   . History of melanoma    Back (Dr. Wilhemina Bonito); early melanoma R arm 03/2011  . Multiple myeloma ([facility]) 2013   skull lytic lesions--treated with radiation 07/2014  . Pulmonary embolism ([facility]) 01/2014  . Radiation 07/21/14-08/05/14   left occipital parietal skull 25 gray  . SCC (squamous cell carcinoma) 08/04/15   right ear;dysplastic nevus with severe atypia-chest    ALLERGIES:  is allergic to amoxicillin and penicillins.  MEDICATIONS:  Current Outpatient Prescriptions  Medication Sig Dispense Refill  . ascorbic acid (VITAMIN C) 500 MG tablet Take 500 mg by mouth 2 (two) times daily.     . cholecalciferol (VITAMIN D) 1000 UNITS tablet Take 1,000 Units by mouth daily.    Marland Kitchen desmopressin (DDAVP) 0.1 MG tablet Take 1 tablet (0.1 mg total) by mouth at bedtime. 30 tablet 0  . dexamethasone (DECADRON) 4 MG tablet Take 1 tablet (4 mg total) by mouth every 6 (six) hours. 28 tablet 0  . dicyclomine (BENTYL) 10 MG capsule Take 1 capsule (10 mg total) by mouth 3 (three) times daily before meals. (Patient taking differently: Take 10 mg by mouth daily as needed for spasms. ) 60 capsule 0  . fish oil-omega-3 fatty acids 1000 MG capsule Take 1 g by mouth daily.     . furosemide (LASIX) 20 MG tablet Take 1 tablet (20 mg total) by mouth daily. (Patient taking differently: Take 20 mg by mouth daily as  needed for fluid. ) 30 tablet 0  . lenalidomide (REVLIMID) 10 MG capsule Take 1 capsule (10 mg total) by mouth daily. 28 capsule 0  . LORazepam (ATIVAN) 1 MG tablet Take 1 tablet (1 mg total) by mouth every 8 (eight) hours as needed for anxiety. 30 tablet 0  . methocarbamol (ROBAXIN) 500 MG tablet TAKE 1 TO 2 TABLETS BY MOUTH EVERY 6 HOURS AS NEEDED FOR MUSCLE SPASMS 60 tablet 0  . mirabegron ER (MYRBETRIQ) 25 MG TB24 tablet Take 1 tablet (25 mg total) by mouth daily. 7 tablet 0  . Multiple Vitamin (MULTIVITAMIN WITH MINERALS) TABS Take 1 tablet by mouth daily.    Marland Kitchen oxyCODONE (ROXICODONE) 5 MG immediate release tablet Take 1-3 tablets (5-15 mg total) by mouth every 4 (four) hours as needed for severe pain (Take 5 mg for mild pain, 10 mg for moderate pain, 15 mg for severe pain). 20 tablet  0  . oxyCODONE-acetaminophen (PERCOCET/ROXICET) 5-325 MG tablet Take 1 tablet by mouth every 4 (four) hours as needed. 30 tablet 0  . pantoprazole (PROTONIX) 40 MG tablet Take 1 tablet by mouth two  times daily 180 tablet 1  . Probiotic Product (ALIGN) 4 MG CAPS Take 1 capsule by mouth daily. Reported on 09/10/2015    . prochlorperazine (COMPAZINE) 10 MG tablet Take 1 tablet (10 mg total) by mouth every 6 (six) hours as needed. 30 tablet 1  . temazepam (RESTORIL) 15 MG capsule Take 1 capsule (15 mg total) by mouth at bedtime as needed. 60 capsule 0  . traMADol (ULTRAM) 50 MG tablet Take 1-2 tablets (50-100 mg total) by mouth every 8 (eight) hours as needed for severe pain. 30 tablet 0  . XARELTO 20 MG TABS tablet Take 1 tablet by mouth  daily 90 tablet 0  . zolendronic acid (ZOMETA) 4 MG/5ML injection Inject 4 mg into the vein every 28 (twenty-eight) days.     No current facility-administered medications for this visit.    Facility-Administered Medications Ordered in Other Visits  Medication Dose Route Frequency Provider Last Rate Last Dose  . ondansetron (ZOFRAN) tablet 8 mg  8 mg Oral Once Curt Bears, MD         SURGICAL HISTORY:  Past Surgical History:  Procedure Laterality Date  . APPENDECTOMY    . BONE MARROW TRANSPLANT  03/2013   Stem Cell  . COLONOSCOPY  2009   adenomatous polyp  . ESOPHAGOGASTRODUODENOSCOPY  2009   mild inflammation at esophagogastric junction  . excision of melanoma     back ( x3)  . Great toe surgery     bilateral  . INGUINAL HERNIA REPAIR     left, x 2  . port placemet  10/2012  . TOTAL KNEE ARTHROPLASTY  06/08/2011   Procedure: TOTAL KNEE ARTHROPLASTY;  Surgeon: Ninetta Lights, MD;  Location: [facility];  Service: Orthopedics;  Laterality: Right;  osteonics  . total knee replaced  2000   Left knee    REVIEW OF SYSTEMS:  Constitutional: positive for fatigue Eyes: negative Ears, nose, mouth, throat, and face: negative Respiratory: negative Cardiovascular: negative Gastrointestinal: positive for nausea Genitourinary:positive for frequency Integument/breast: negative Hematologic/lymphatic: negative Musculoskeletal:positive for arthralgias Neurological: positive for headaches Behavioral/Psych: negative Endocrine: negative Allergic/Immunologic: negative   PHYSICAL EXAMINATION: General appearance: alert, cooperative and no distress Head: Normocephalic, without obvious abnormality, atraumatic, Ecchymosis at the right frontal area of the face. Neck: no adenopathy, no JVD, supple, symmetrical, trachea midline and thyroid not enlarged, symmetric, no tenderness/mass/nodules Lymph nodes: Cervical, supraclavicular, and axillary nodes normal. Resp: clear to auscultation bilaterally Back: symmetric, no curvature. ROM normal. No CVA tenderness. Cardio: regular rate and rhythm, S1, S2 normal, no murmur, click, rub or gallop GI: soft, non-tender; bowel sounds normal; no masses,  no organomegaly Extremities: extremities normal, atraumatic, no cyanosis or edema Neurologic: Alert and oriented X 3, normal strength and tone. Normal symmetric reflexes. Normal coordination  and gait  ECOG PERFORMANCE STATUS: 1 - Symptomatic but completely ambulatory  Blood pressure 130/76, pulse 65, temperature 98 F (36.7 C), temperature source Oral, resp. rate 18, height 5' 9.25" (1.759 m), weight 216 lb 9.6 oz (98.2 kg), SpO2 98 %.  LABORATORY DATA: Lab Results  Component Value Date   WBC 3.6 (L) 04/27/2016   HGB 13.3 04/27/2016   HCT 39.0 04/27/2016   MCV 91.9 04/27/2016   PLT 150 04/27/2016      Chemistry  Component Value Date/Time   NA 136 04/27/2016 1223   NA 144 03/15/2016 0912   K 3.8 04/27/2016 1223   K 3.7 03/15/2016 0912   CL 100 (L) 04/27/2016 1223   CL 109 (H) 12/31/2012 0759   CO2 24 04/27/2016 1210   CO2 24 03/15/2016 0912   BUN 11 04/27/2016 1223   BUN 10.8 03/15/2016 0912   CREATININE 1.00 04/27/2016 1223   CREATININE 1.11 04/25/2016 1514   CREATININE 0.9 03/15/2016 0912      Component Value Date/Time   CALCIUM 8.4 (L) 04/27/2016 1210   CALCIUM 9.3 03/15/2016 0912   ALKPHOS 47 04/27/2016 1210   ALKPHOS 51 03/15/2016 0912   AST 30 04/27/2016 1210   AST 19 03/15/2016 0912   ALT 31 04/27/2016 1210   ALT 18 03/15/2016 0912   BILITOT 0.4 04/27/2016 1210   BILITOT 0.97 03/15/2016 0912      RADIOGRAPHIC STUDIES: No results found.  ASSESSMENT AND PLAN: This is a very pleasant 75 years old white male with   1) history of multiple myeloma status post induction chemotherapy with Velcade, Revlimid and dexamethasone followed by treatment with Carfilzomib, cyclophosphamide and dexamethasone with significant improvement in his disease followed by autologous peripheral blood stem cell transplant at [facility] on 03/20/2013. This was followed by 2 cycles of consolidation chemotherapy with Carfilzomib, Cytoxan and dexamethasone, and he tolerated it fairly well. He completed maintenance Revlimid 10 mg by mouth daily for 3 months and tolerating it fairly well. He started maintenance Revlimid with 15 mg by mouth daily in May 2015 and  tolerating it well except for the persistent neutropenia.  He started the reduced dose of Revlimid 10 mg by mouth daily on 02/19/2015. He is tolerating this treatment much better.  The recent myeloma panel showed no evidence for disease progression. I discussed the lab result with the patient today. I recommended for the patient to discontinue his current treatment with Revlimid for now because of the concern about Revlimid induced second malignancy. 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 These findings could be a new malignancy probably lung cancer or other primary. It is always a possibility of secondary malignancy secondary to prolonged treatment with Revlimid. I advised him to discontinue Revlimid at this point. I am a PET scan for further evaluation of his disease.  I also referred the patient to cardiothoracic surgery for consideration of bronchoscopy with endobronchial ultrasound and biopsy for tissue diagnosis. For the metastatic brain lesions I referred the patient to Dr. Sondra Come for evaluation and recommendation regarding treatment of his brain metastasis. He was advised to continue his Decadron 4 mg by mouth every 6 hours and we will taper the dose gradually over the next few weeks. I also strongly advised the patient not to drive for now until treatment of his metastatic brain lesions.  He will continue treatment with Zometa every 3 months.  For the pulmonary embolism, he will continue on Xarelto 20 mg by mouth daily as scheduled but take an established 2-3 days before the biopsy and resume it again after the biopsy.  For insomnia he will continue on Restoril. For the peripheral neuropathy, I started the patient on Neurontin 100 mg by mouth 3 times a day. For the back pain, he was given a refill of Percocet. The patient would come back for follow-up visit after his PET scan and biopsy  for more detailed  discussion of his treatment options. For the urine frequency, I recommended for the patient to contact his urologist for further evaluation. He was advised to call immediately if he has any concerning symptoms in the interval. The patient voices understanding of current disease status and treatment options and is in agreement with the current care plan.  All questions were answered. The patient knows to call the clinic with any problems, questions or concerns. We can certainly see the patient much sooner if necessary.  Disclaimer: This note was dictated with voice recognition software. Similar sounding words can inadvertently be transcribed and may not be corrected upon review.

## 2016-04-29 NOTE — Telephone Encounter (Signed)
Rx for dexadron called into pt pharmacy

## 2016-04-29 NOTE — Telephone Encounter (Signed)
Informed patient about PET scan. Patient refused AVS

## 2016-05-02 ENCOUNTER — Telehealth: Payer: Self-pay | Admitting: Medical Oncology

## 2016-05-02 LAB — ARGININE VASOPRESSIN HORMONE: Arginine Vasopressin: <1 pg/mL — ABNORMAL LOW

## 2016-05-02 NOTE — Telephone Encounter (Signed)
I told pt per [name] to hold Zometa for now.

## 2016-05-02 NOTE — Progress Notes (Signed)
  Location/Histology of Brain Tumor: Right Superior Cerebellum and Right Occipital Lobe   Patient presented with symptoms of:  Persistent headache -  CT scan of the head which was performed on 04/27/2016 and it showed enhancing lesions in the right superior cerebellum and right occipital lobe measuring up to 16 mm in the cerebellum. Metastasis, subacute infarct or radiation necrosis were considerations  MRI of the brain was performed on the same day and it showed multiple enhancing mass lesions and the pain most consistent with metastatic disease. There was mild edema in the right cerebellum and right parietal operculum lesion but no shift of the midline structures. Mild edema in the right cerebellum and right parietal operculum lesion, but no midline shift.  Past or anticipated interventions, if any, per neurosurgery: Unknown  Past or anticipated interventions, if any, per medical oncology: Chemotherapy for Multiple Myeloma 2014 - 02/19/2015  Dose of Decadron, if applicable: 4 mg PO Q6 hours, started on 04/29/16    Recent neurologic symptoms, if any:   Seizures: No  Headaches: Increasing, persistent headache reported on 04/29/16 - Mild headache today in the right frontal region  Nausea: None  Dizziness/ataxia: Ataxia when getting up from lying or sitting position. Walked at [employer] today  Difficulty with hand coordination: None  Focal numbness/weakness: Numbness in the right lateral hand  Visual deficits/changes: 2 weeks ago blurred vision when in [place] - none today  Confusion/Memory deficits:None  Painful bone metastases at present, if any:  SAFETY ISSUES: Prior radiation? Site/dose:   Left occipital parietal skull, 25 gray in an fractions- January 11 through January 26  Pacemaker/ICD? No  Possible current pregnancy? N/A  Is the patient on methotrexate? No  Additional Complaints / other details:  CT Scan of Chest 04/28/16 Shows right paratracheal lymph Node 1.2cm,  right hilar node 1.8cm, subcarinal nodule 1.5cm and right hilar node 1.7cm. Small nodule in LUL 27m Pulmonary Embolism - Xarelto Polyuria/Polydipsia as of 04/29/16 Stage IIA multiple myeloma diagnosed in July of 2013.

## 2016-05-03 ENCOUNTER — Other Ambulatory Visit: Payer: Self-pay | Admitting: *Deleted

## 2016-05-03 ENCOUNTER — Encounter: Payer: Self-pay | Admitting: Family Medicine

## 2016-05-03 ENCOUNTER — Institutional Professional Consult (permissible substitution) (INDEPENDENT_AMBULATORY_CARE_PROVIDER_SITE_OTHER): Payer: Medicare Other | Admitting: Cardiothoracic Surgery

## 2016-05-03 ENCOUNTER — Encounter: Payer: Self-pay | Admitting: Cardiothoracic Surgery

## 2016-05-03 VITALS — BP 111/81 | HR 68 | Resp 20 | Ht 69.25 in | Wt 212.0 lb

## 2016-05-03 DIAGNOSIS — R599 Enlarged lymph nodes, unspecified: Secondary | ICD-10-CM

## 2016-05-03 DIAGNOSIS — R590 Localized enlarged lymph nodes: Secondary | ICD-10-CM | POA: Diagnosis not present

## 2016-05-03 DIAGNOSIS — R591 Generalized enlarged lymph nodes: Secondary | ICD-10-CM

## 2016-05-03 DIAGNOSIS — Z8579 Personal history of other malignant neoplasms of lymphoid, hematopoietic and related tissues: Secondary | ICD-10-CM

## 2016-05-03 NOTE — Progress Notes (Signed)
Dr. Tomi Bamberger your note to me requests that I get tiering excepting started for Kombiglyze, but I don't see that this pt is on Bart[place]? Please advise

## 2016-05-03 NOTE — Progress Notes (Signed)
[address]       [place] [address]             [phone]                    Phenix S [place] [place] Medical Record #[id number] Date of Birth: 1940-07-21  Referring: Curt Bears, MD Primary Care: [name], MD  Chief Complaint:    Chief Complaint  Patient presents with  . Adenopathy    Surgical eval, needs tissue BX, Chest CT 04/28/16, MR Brain 04/27/16, PET Scan pending 05/05/16    History of Present Illness:    [name] 75 y.o. male is seen in the office  today for questionable metastatic carcinoma with multiple brain metastasis as well as mediastinal lymphadenopathy. Patient has history of Stage IIA multiple myeloma diagnosed in July of 2013. The patient had noted increasing headaches with some slight blurred vision but no obvious field cuts. Because of his increasing headaches a CT scan and MRI of the brain were done by Dr. Inda Merlin demonstrating evidence of brain metastasis, CT of the chest suggested possible lung primary. The patient has a very distant history of smoking but quit more than 30 years ago. He denies any hemoptysis      Multiple myeloma ([place])   Staging form: Multiple Myeloma, AJCC 6th Edition   - Clinical: Stage IIA - Signed by Curt Bears, MD on 02/02/2012   CURRENT THERAPY:  1) Maintenance therapy with Revlimid 10 mg by mouth daily started 02/19/2015. currently on hold starting today.  2) Zometa 4 mg IV every 3 months. 3) Xarelto 20 mg by mouth daily for pulmonary embolism.Current Activity/ Functional Status: PRIOR THERAPY:  1) Systemic chemotherapy with subcutaneous Velcade 1.3 mg/M2 on days 1, 4, 8 and 11 every 3 weeks in addition to Revlimid 25 mg by mouth daily for 14 days every 3 weeks and dexamethasone 40 mg on a weekly basis. Status post 4 planned cycles.  2) Systemic chemotherapy with subcutaneous Velcade 1.3 mg/M2 on days 1, 4, 8 and 11 every 3 weeks in addition to Revlimid 25 mg by mouth daily for 14  days every 3 weeks and dexamethasone 40 mg on a weekly basis. 4 more cycles are planned and he is status post 4 cycles of the second set of 4 cycles, now status post a total of 10 cycles. 3) Systemic chemotherapy with Carfilzomib 36 mg/M2 on days 1, 2, 8, 9, 15 and 16 in addition to cyclophosphamide 300 mg/M2 IV on days 1, 8 and 15 as well as dexamethasone on days 1, 8, 15 and 22 every 4 weeks. Status post 4 cycles. 4) Zometa 4 mg IV every month. 5) autologous peripheral blood stem cell transplant on 03/20/2013 at [facility]. 6) Consolidation chemotherapy with Carfilzomib 36 mg/M2 on days 1, 2, 8, 9, 15 and 16 every 4 weeks in addition to cyclophosphamide 300 mg/M2 on days 1, 8 and 15 as well as dexamethasone 40 mg on a weekly basis every 4 weeks. Status post 2 cycles. First cycle 06/11/2013. 7) Maintenance therapy with Revlimid 10 mg by mouth daily status post 3 months of treatment. 8) palliative radiotherapy to the lytic lesion in the skull under the care of Dr. Sondra Come 9) Maintenance therapy with Revlimid 15 mg by mouth daily started 11/09/2013 and discontinued 02/18/2015 secondary to persistent neutropenia.    Patient is independent with mobility/ambulation, transfers, ADL's, IADL's.   Zubrod Score: At the  time of surgery this patient's most appropriate activity status/level should be described as: _0     0    Normal activity, no symptoms _1     1    Restricted in physical strenuous activity but ambulatory, able to do out light work _2     2    Ambulatory and capable of self care, unable to do work activities, up and about               >50 % of waking hours                              _3     3    Only limited self care, in bed greater than 50% of waking hours _4     4    Completely disabled, no self care, confined to bed or chair _5     5    Moribund   Past Medical History:  Diagnosis Date  . Adenomatous polyp of colon 1996  . Arthritis    Osteoarthritis right knee, spine, wrist  .  Arthritis of hand, right    Right thumb (Dr. Daylene Katayama)  . Back ache    r/o Multiple Myeloma  . Benign prostatic hypertrophy    (Dr. Karsten Ro in past)  . Degenerative disc disease   . Diverticulosis   . DVT (deep venous thrombosis) ([place]) 01/2014   R calf  . Encounter for antineoplastic chemotherapy 10/20/2015  . Erosive esophagitis   . GERD (gastroesophageal reflux disease)   . Hemorrhoids   . Hiatal hernia   . History of chronic prostatitis   . History of melanoma    Back (Dr. Wilhemina Bonito); early melanoma R arm 03/2011  . Multiple myeloma ([place]) 2013   skull lytic lesions--treated with radiation 07/2014  . Pulmonary embolism ([place]) 01/2014  . Radiation 07/21/14-08/05/14   left occipital parietal skull 25 gray  . SCC (squamous cell carcinoma) 08/04/15   right ear;dysplastic nevus with severe atypia-chest    Past Surgical History:  Procedure Laterality Date  . APPENDECTOMY    . BONE MARROW TRANSPLANT  03/2013   Stem Cell  . COLONOSCOPY  2009   adenomatous polyp  . ESOPHAGOGASTRODUODENOSCOPY  2009   mild inflammation at esophagogastric junction  . excision of melanoma     back ( x3)  . Great toe surgery     bilateral  . INGUINAL HERNIA REPAIR     left, x 2  . port placemet  10/2012  . TOTAL KNEE ARTHROPLASTY  06/08/2011   Procedure: TOTAL KNEE ARTHROPLASTY;  Surgeon: Ninetta Lights, MD;  Location: [facility];  Service: Orthopedics;  Laterality: Right;  osteonics  . total knee replaced  2000   Left knee    Family History  Problem Relation Age of Onset  . Dementia Mother   . Stroke Father     brainstem stroke  . Hypertension Father   . Diabetes Father   . Prostate cancer Maternal Grandfather   . Depression Daughter   . Anesthesia problems Neg Hx     Social History   Social History  . Marital status: Married    Spouse name: N/A  . Number of children: 2  . Years of education: N/A   Occupational History  . 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 Smoking status: Former Smoker    Packs/day: 3.00    Years: 10.00  Types: Cigarettes    Quit date: 07/11/1976  . Smokeless tobacco: Never Used     Comment: 3 PPD x 6 years  . Alcohol use 1.5 oz/week    3 Standard drinks or equivalent per week     Comment: 2 glasses of wine daily  . Drug use: No  . Sexual activity: Yes    Partners: Female   Other Topics Concern  . Not on file   Social History Narrative   Married.  2 daughters, both in [place], 4 granddaughters    History  Smoking Status  . Former Smoker  . Packs/day: 3.00  . Years: 10.00  . Types: Cigarettes  . Quit date: 07/11/1976  Smokeless Tobacco  . Never Used    Comment: 3 PPD x 6 years    History  Alcohol Use  . 1.5 oz/week  . 3 Standard drinks or equivalent per week    Comment: 2 glasses of wine daily     Allergies  Allergen Reactions  . Amoxicillin     REACTION: rash  . Penicillins Rash    Has patient had a PCN reaction causing immediate rash, facial/tongue/throat swelling, SOB or lightheadedness with hypotension: unknown Has patient had a PCN reaction causing severe rash involving mucus membranes or skin necrosis: unknown Has patient had a PCN reaction that required hospitalization: no  Has patient had a PCN reaction occurring within the last 10 years: no  If all of the above answers are "NO", then may proceed with Cephalosporin use.     Current Outpatient Prescriptions  Medication Sig Dispense Refill  . ascorbic acid (VITAMIN C) 500 MG tablet Take 500 mg by mouth 2 (two) times daily.     . cholecalciferol (VITAMIN D) 1000 UNITS tablet Take 1,000 Units by mouth daily.    Marland Kitchen desmopressin (DDAVP) 0.1 MG tablet Take 1 tablet (0.1 mg total) by mouth at bedtime. 30 tablet 0  . dexamethasone (DECADRON) 4 MG tablet Take 1 tablet (4 mg total) by mouth every 6 (six) hours. 40 tablet 0  . dicyclomine (BENTYL) 10 MG capsule Take 1 capsule (10 mg total) by mouth 3 (three) times daily  before meals. (Patient taking differently: Take 10 mg by mouth daily as needed for spasms. ) 60 capsule 0  . fish oil-omega-3 fatty acids 1000 MG capsule Take 1 g by mouth daily.     . furosemide (LASIX) 20 MG tablet Take 1 tablet (20 mg total) by mouth daily. (Patient taking differently: Take 20 mg by mouth daily as needed for fluid. ) 30 tablet 0  . LORazepam (ATIVAN) 1 MG tablet Take 1 tablet (1 mg total) by mouth every 8 (eight) hours as needed for anxiety. 30 tablet 0  . methocarbamol (ROBAXIN) 500 MG tablet TAKE 1 TO 2 TABLETS BY MOUTH EVERY 6 HOURS AS NEEDED FOR MUSCLE SPASMS 60 tablet 0  . mirabegron ER (MYRBETRIQ) 25 MG TB24 tablet Take 1 tablet (25 mg total) by mouth daily. 7 tablet 0  . Multiple Vitamin (MULTIVITAMIN WITH MINERALS) TABS Take 1 tablet by mouth daily.    Marland Kitchen oxyCODONE (ROXICODONE) 5 MG immediate release tablet Take 1-3 tablets (5-15 mg total) by mouth every 4 (four) hours as needed for severe pain (Take 5 mg for mild pain, 10 mg for moderate pain, 15 mg for severe pain). 20 tablet 0  . oxyCODONE-acetaminophen (PERCOCET/ROXICET) 5-325 MG tablet Take 1 tablet by mouth every 4 (four) hours as needed. 30 tablet 0  . pantoprazole (PROTONIX) 40  MG tablet Take 1 tablet by mouth two  times daily 180 tablet 1  . Probiotic Product (ALIGN) 4 MG CAPS Take 1 capsule by mouth daily. Reported on 09/10/2015    . prochlorperazine (COMPAZINE) 10 MG tablet Take 1 tablet (10 mg total) by mouth every 6 (six) hours as needed. 30 tablet 1  . temazepam (RESTORIL) 15 MG capsule Take 1 capsule (15 mg total) by mouth at bedtime as needed. 60 capsule 0  . traMADol (ULTRAM) 50 MG tablet Take 1-2 tablets (50-100 mg total) by mouth every 8 (eight) hours as needed for severe pain. 30 tablet 0  . XARELTO 20 MG TABS tablet Take 1 tablet by mouth  daily 90 tablet 0  . zolendronic acid (ZOMETA) 4 MG/5ML injection Inject 4 mg into the vein every 28 (twenty-eight) days.     No current facility-administered  medications for this visit.    Facility-Administered Medications Ordered in Other Visits  Medication Dose Route Frequency Provider Last Rate Last Dose  . ondansetron (ZOFRAN) tablet 8 mg  8 mg Oral Once Curt Bears, MD          Review of Systems:     Cardiac Review of Systems: Y or N  Chest Pain [ n   ]  Resting SOB [ n  ] Exertional SOB  [n  ]  Vertell Limber Florencio.Farrier  ]   Pedal Edema [ n  ]    Palpitations [ n ] Syncope  [  ]   Presyncope [  n ]  General Review of Systems: [Y] = yes [  ]=non Constitional: recent weight change [  ];  Wt loss over the last 3 months [   ] anorexia [  ]; fatigue [  ]; nausea [  ]; night sweats [  ]; fever [  ]; or chills [  ];          Dental: poor dentition[  ]; Last Dentist visit:   Eye : blurred vision [  ]; diplopia [   ]; vision changes [  ];  Amaurosis fugax[  ]; Resp: cough [  ];  wheezing[  ];  hemoptysis[  ]; shortness of breath[  ]; paroxysmal nocturnal dyspnea[  ]; dyspnea on exertion[  ]; or orthopnea[  ];  GI:  gallstones[  ], vomiting[  ];  dysphagia[  ]; melena[  ];  hematochezia [  ]; heartburn[  ];   Hx of  Colonoscopy[  ]; GU: kidney stones [  ]; hematuria[  ];   dysuria [  ];  nocturia[  ];  history of     obstruction [  ]; urinary frequency [  ]             Skin: rash, swelling[  ];, hair loss[  ];  peripheral edema[  ];  or itching[  ]; Musculosketetal: myalgias[  ];  joint swelling[  ];  joint erythema[  ];  joint pain[  ];  back pain[  ];  Heme/Lymph: bruising[  ];  bleeding[  ];  anemia[  ];  Neuro: TIA[  ];  headaches[y  ];  stroke[  ];  vertigo[  ];  seizures[n  ];   paresthesias[ n ];  difficulty walking[  ];  Psych:depression[  ]; anxiety[  ];  Endocrine: diabetes[  ];  thyroid dysfunction[  ];  Immunizations: Flu up to date [  ]; Pneumococcal up to date [  ];  Other:  Physical Exam: BP 111/81 (BP Location:  Left Arm, Patient Position: Sitting, Cuff Size: Normal)   Pulse 68   Resp 20   Ht 5' 9.25" (1.759 m)   Wt 212 lb (96.2 kg)    BMI 31.08 kg/m   PHYSICAL EXAMINATION: General appearance: alert, cooperative and no distress Head: Normocephalic, without obvious abnormality, atraumatic Neck: no adenopathy, no carotid bruit, no JVD, supple, symmetrical, trachea midline and thyroid not enlarged, symmetric, no tenderness/mass/nodules Lymph nodes: Cervical, supraclavicular, and axillary nodes normal. Resp: clear to auscultation bilaterally Back: symmetric, no curvature. ROM normal. No CVA tenderness. Cardio: regular rate and rhythm, S1, S2 normal, no murmur, click, rub or gallop GI: soft, non-tender; bowel sounds normal; no masses,  no organomegaly Extremities: extremities normal, atraumatic, no cyanosis or edema Neurologic: Grossly normal  Diagnostic Studies & Laboratory data:     Recent Radiology Findings:   Ct Head W Wo Contrast  Result Date: 04/27/2016 CLINICAL DATA:  Daily headaches. History of lytic skull lesion and posterior calvarial radiation 2016. History of stem cell transplant in 2014. EXAM: CT HEAD WITHOUT AND WITH CONTRAST TECHNIQUE: Contiguous axial images were obtained from the base of the skull through the vertex without and with intravenous contrast CONTRAST:  36m ISOVUE-300 IOPAMIDOL intravenous COMPARISON:  04/16/2015 FINDINGS: Brain: Ovoid area of ring enhancement superior right cerebellum measuring up to 16 mm. There is a 6 mm focus of nodular enhancement in the parasagittal right occipital lobe. No evidence of acute infarct, hemorrhage, or hydrocephalus. Vascular: Major vessels are patent. Skull: Stable.  No progressed lucency.  No table erosion. Sinuses/Orbits: Bilateral cataract resection. Other: These results will be called to the ordering clinician or representative by the Radiologist Assistant, and communication documented in the PACS or zVision Dashboard. IMPRESSION: 1. Enhancing lesions in the right superior cerebellum and right occipital lobe, measuring up to 16 mm in the cerebellum. Metastasis  (especially from patient's history of melanoma), subacute infarct, or radiation necrosis (history of posterior skull radiotherapy) are differential considerations. If no contraindication to MRI, recommend enhanced MRI follow-up. If subacute infarct is a clinical possibility, delaying the scan for 6 weeks would allow infarct related enhancement to subside. 2. Stable calvarium. Electronically Signed   By: JMonte FantasiaM.D.   On: 04/27/2016 10:27   Ct Chest W Contrast  Result Date: 04/28/2016 CLINICAL DATA:  History of multiple myeloma. Newly diagnosed brain metastases. EXAM: CT CHEST, ABDOMEN, AND PELVIS WITH CONTRAST TECHNIQUE: Multidetector CT imaging of the chest, abdomen and pelvis was performed following the standard protocol during bolus administration of intravenous contrast. CONTRAST:  328mISOVUE-300 IOPAMIDOL (ISOVUE-300) INJECTION 61%, 10062mSOVUE-300 IOPAMIDOL (ISOVUE-300) INJECTION 61% COMPARISON:  02/07/2014 and abdomen and pelvis from 04/28/2014. FINDINGS: CT CHEST FINDINGS Cardiovascular: The heart size appears normal. There is no pericardial effusion. Mediastinum/Nodes: The trachea appears patent and is midline. Normal appearance of the esophagus. Right paratracheal lymph node measures 1.2 cm, image number 23 of series 2. There is a right hilar node which measures 1.8 cm, image 29 of series 2. Previously 0.7 cm. Sub- carinal lymph node measures 1.5 cm, image 30 of series 2. Previously 0.7 cm. Right hilar lymph node measures 1.7 cm, image 35 of series 2. Previously 1.0 cm. Lungs/Pleura: No pleural effusion. Small nodule in the left upper lobe is new from previous exam measuring 5 mm, image number 26 of series 4. Musculoskeletal: Spondylosis is identified within the thoracic spine. The bones appear osteopenic. CT ABDOMEN PELVIS FINDINGS Hepatobiliary: No focal liver abnormality is seen. No gallstones, gallbladder wall thickening, or biliary  dilatation.The gallbladder is normal. No biliary  dilatation Pancreas: Unremarkable. No pancreatic ductal dilatation or surrounding inflammatory changes. Spleen: Normal in size without focal abnormality. Adrenals/Urinary Tract: The adrenal glands are normal. There is a normal appearance of the right kidney. Scarring involving the upper pole the left kidney noted. The urinary bladder appears normal. Stomach/Bowel: The stomach is normal. The small bowel loops have a normal course and caliber. No pathologic dilatation of the colon. Vascular/Lymphatic: Calcified atherosclerotic disease involves the abdominal aorta. No aneurysm. No enlarged upper abdominal lymph nodes identified. No pelvic or inguinal adenopathy identified. Reproductive: Prostate is unremarkable. Other: No abdominal wall hernia or abnormality. No abdominopelvic ascites. Musculoskeletal: The bones appear diffusely osteopenic. There is degenerative disc disease and scoliosis within the lumbar spine. IMPRESSION: 1. Interval development of mediastinal and hilar adenopathy. Findings may reflect primary bronchogenic carcinoma, metastatic adenopathy or lymphoproliferative disorder. In light of the patient's new brain metastasis primary bronchogenic carcinoma is favored. Suggest further workup with PET-CT and tissue sampling. Lymph nodes should be easily amendable to biopsy via bronchoscopy. 2. Aortic atherosclerosis. Electronically Signed   By: Kerby Moors M.D.   On: 04/28/2016 16:27   Mr Jeri Cos And Wo Contrast  Result Date: 04/27/2016 CLINICAL DATA:  History of multiple myeloma. Dizziness. Abnormal CT head EXAM: MRI HEAD WITHOUT AND WITH CONTRAST TECHNIQUE: Multiplanar, multiecho pulse sequences of the brain and surrounding structures were obtained without and with intravenous contrast. CONTRAST:  25m MULTIHANCE GADOBENATE DIMEGLUMINE 529 MG/ML IV SOLN COMPARISON:  CT head 04/27/2016, MRI 07/31/2013 FINDINGS: Brain: Multiple enhancing lesions are present in the brain compatible with metastatic  disease. The largest lesion in the right superior cerebellum measures 16 x 12 mm with surrounding edema. Right parietal operculum lesion measures 8 x 9 mm. Right occipital lobe lesion measures 5 mm Small bilateral cerebellar enhancing lesions 2 mm each. Abnormal pituitary. Pituitary is enlarged and shows heterogeneous enhancement. There is also thickening and enhancement of the infundibulum diffusely. These findings were not present on the prior MRI 07/31/2013. Currently the pituitary measures 10 mm and height. Cavernous sinus normal. No compression of the optic chiasm. Generalized atrophy. Negative for hydrocephalus. Negative for acute infarct. No significant chronic ischemic change. No intracranial hemorrhage. Vascular: Normal arterial flow void. Skull and upper cervical spine: Negative Sinuses/Orbits: Mucosal edema in the paranasal sinuses. No orbital mass. Bilateral lens replacement. Other: None IMPRESSION: Multiple enhancing mass lesions in the brain most consistent with metastatic disease. Mild edema in the right cerebellum and right parietal operculum lesion. No shift of the midline structures. Abnormal pituitary which shows heterogeneous enhancement and is mildly enlarged measuring 10 mm. Thickening and enhancement of the infundibulum. Differential includes metastatic disease to the pituitary. Other possibilities include lymphocytic hypophysitis, sarcoid, pituitary adenoma. Electronically Signed   By: CFranchot GalloM.D.   On: 04/27/2016 15:27   Ct Abdomen Pelvis W Contrast  Result Date: 04/28/2016 CLINICAL DATA:  History of multiple myeloma. Newly diagnosed brain metastases. EXAM: CT CHEST, ABDOMEN, AND PELVIS WITH CONTRAST TECHNIQUE: Multidetector CT imaging of the chest, abdomen and pelvis was performed following the standard protocol during bolus administration of intravenous contrast. CONTRAST:  324mISOVUE-300 IOPAMIDOL (ISOVUE-300) INJECTION 61%, 10086mSOVUE-300 IOPAMIDOL (ISOVUE-300) INJECTION  61% COMPARISON:  02/07/2014 and abdomen and pelvis from 04/28/2014. FINDINGS: CT CHEST FINDINGS Cardiovascular: The heart size appears normal. There is no pericardial effusion. Mediastinum/Nodes: The trachea appears patent and is midline. Normal appearance of the esophagus. Right paratracheal lymph node measures 1.2 cm, image number 23 of  series 2. There is a right hilar node which measures 1.8 cm, image 29 of series 2. Previously 0.7 cm. Sub- carinal lymph node measures 1.5 cm, image 30 of series 2. Previously 0.7 cm. Right hilar lymph node measures 1.7 cm, image 35 of series 2. Previously 1.0 cm. Lungs/Pleura: No pleural effusion. Small nodule in the left upper lobe is new from previous exam measuring 5 mm, image number 26 of series 4. Musculoskeletal: Spondylosis is identified within the thoracic spine. The bones appear osteopenic. CT ABDOMEN PELVIS FINDINGS Hepatobiliary: No focal liver abnormality is seen. No gallstones, gallbladder wall thickening, or biliary dilatation.The gallbladder is normal. No biliary dilatation Pancreas: Unremarkable. No pancreatic ductal dilatation or surrounding inflammatory changes. Spleen: Normal in size without focal abnormality. Adrenals/Urinary Tract: The adrenal glands are normal. There is a normal appearance of the right kidney. Scarring involving the upper pole the left kidney noted. The urinary bladder appears normal. Stomach/Bowel: The stomach is normal. The small bowel loops have a normal course and caliber. No pathologic dilatation of the colon. Vascular/Lymphatic: Calcified atherosclerotic disease involves the abdominal aorta. No aneurysm. No enlarged upper abdominal lymph nodes identified. No pelvic or inguinal adenopathy identified. Reproductive: Prostate is unremarkable. Other: No abdominal wall hernia or abnormality. No abdominopelvic ascites. Musculoskeletal: The bones appear diffusely osteopenic. There is degenerative disc disease and scoliosis within the lumbar  spine. IMPRESSION: 1. Interval development of mediastinal and hilar adenopathy. Findings may reflect primary bronchogenic carcinoma, metastatic adenopathy or lymphoproliferative disorder. In light of the patient's new brain metastasis primary bronchogenic carcinoma is favored. Suggest further workup with PET-CT and tissue sampling. Lymph nodes should be easily amendable to biopsy via bronchoscopy. 2. Aortic atherosclerosis. Electronically Signed   By: Kerby Moors M.D.   On: 04/28/2016 16:27     I have independently reviewed the above radiologic studies.  Recent Lab Findings: Lab Results  Component Value Date   WBC 3.6 (L) 04/27/2016   HGB 13.3 04/27/2016   HCT 39.0 04/27/2016   PLT 150 04/27/2016   GLUCOSE 93 04/27/2016   CHOL 91 (L) 02/04/2015   TRIG 62 02/04/2015   HDL 60 02/04/2015   LDLCALC 19 02/04/2015   ALT 31 04/27/2016   AST 30 04/27/2016   NA 136 04/27/2016   K 3.8 04/27/2016   CL 100 (L) 04/27/2016   CREATININE 1.00 04/27/2016   BUN 11 04/27/2016   CO2 24 04/27/2016   TSH 1.70 02/24/2016   INR 1.10 04/27/2016   HGBA1C 5.2 04/25/2016      Assessment / Plan:   Patient with previous history of multiple myeloma and also early stage melanoma. Now presents with headache and found to have brain metastasis and increasing mediastinal adenopathy consistent with possible primary lung malignancy with brain metastasis. I reviewed the radiographic findings with the patient and recommended that we proceed with bronchoscopy and ebus with biopsy under general anesthesia. We'll have him stop his xarelto 3 days or to the biopsy and will resume postop. Per Dr. Lew Dawes note he did not need bridging.     A PET scan is to be done on Thursday afternoon, we tentatively have biopsy scheduled on Tuesday pending the PET scan results.  I confirmed with him what Dr. [name] and told him that he should avoid driving, I added that he should not operate a boat in addition.   I  spent 40  minutes counseling the patient face to face and 50% or more the  time was spent in counseling and coordination  of care. The total time spent in the appointment was 60 minutes.  Grace Isaac MD      [address] [place] [address] Office [phone]   Beeper [phone]  05/03/2016 1:25 PM

## 2016-05-03 NOTE — Progress Notes (Signed)
Yes I did receive a request for a different patient for Kom[facility]ze

## 2016-05-04 ENCOUNTER — Encounter: Payer: Self-pay | Admitting: Radiation Oncology

## 2016-05-04 ENCOUNTER — Other Ambulatory Visit: Payer: Self-pay | Admitting: Medical Oncology

## 2016-05-04 ENCOUNTER — Other Ambulatory Visit: Payer: Self-pay | Admitting: Radiation Therapy

## 2016-05-04 ENCOUNTER — Ambulatory Visit
Admission: RE | Admit: 2016-05-04 | Discharge: 2016-05-04 | Disposition: A | Discharge status: Home / Self Care | Payer: Medicare Other | Source: Ambulatory Visit | Attending: Radiation Oncology | Admitting: Radiation Oncology

## 2016-05-04 ENCOUNTER — Other Ambulatory Visit: Payer: Self-pay | Admitting: Radiation Oncology

## 2016-05-04 VITALS — BP 142/81 | HR 73 | Temp 97.8°F | Resp 20 | Ht 69.25 in | Wt 215.4 lb

## 2016-05-04 DIAGNOSIS — C7931 Secondary malignant neoplasm of brain: Secondary | ICD-10-CM

## 2016-05-04 DIAGNOSIS — Z8042 Family history of malignant neoplasm of prostate: Secondary | ICD-10-CM | POA: Diagnosis not present

## 2016-05-04 DIAGNOSIS — Z8249 Family history of ischemic heart disease and other diseases of the circulatory system: Secondary | ICD-10-CM | POA: Insufficient documentation

## 2016-05-04 DIAGNOSIS — R591 Generalized enlarged lymph nodes: Secondary | ICD-10-CM

## 2016-05-04 DIAGNOSIS — Z923 Personal history of irradiation: Secondary | ICD-10-CM | POA: Diagnosis not present

## 2016-05-04 DIAGNOSIS — Z9889 Other specified postprocedural states: Secondary | ICD-10-CM | POA: Diagnosis not present

## 2016-05-04 DIAGNOSIS — Z87891 Personal history of nicotine dependence: Secondary | ICD-10-CM | POA: Insufficient documentation

## 2016-05-04 DIAGNOSIS — Z510 Encounter for antineoplastic radiation therapy: Secondary | ICD-10-CM | POA: Diagnosis not present

## 2016-05-04 DIAGNOSIS — C9000 Multiple myeloma not having achieved remission: Secondary | ICD-10-CM

## 2016-05-04 DIAGNOSIS — Z79899 Other long term (current) drug therapy: Secondary | ICD-10-CM | POA: Insufficient documentation

## 2016-05-04 DIAGNOSIS — Z833 Family history of diabetes mellitus: Secondary | ICD-10-CM | POA: Insufficient documentation

## 2016-05-04 DIAGNOSIS — C7949 Secondary malignant neoplasm of other parts of nervous system: Principal | ICD-10-CM

## 2016-05-04 DIAGNOSIS — Z85820 Personal history of malignant melanoma of skin: Secondary | ICD-10-CM | POA: Insufficient documentation

## 2016-05-04 DIAGNOSIS — Z85858 Personal history of malignant neoplasm of other endocrine glands: Secondary | ICD-10-CM | POA: Diagnosis not present

## 2016-05-04 DIAGNOSIS — Z818 Family history of other mental and behavioral disorders: Secondary | ICD-10-CM | POA: Insufficient documentation

## 2016-05-04 DIAGNOSIS — C3491 Malignant neoplasm of unspecified part of right bronchus or lung: Secondary | ICD-10-CM | POA: Insufficient documentation

## 2016-05-04 DIAGNOSIS — R590 Localized enlarged lymph nodes: Secondary | ICD-10-CM | POA: Insufficient documentation

## 2016-05-04 DIAGNOSIS — Z823 Family history of stroke: Secondary | ICD-10-CM | POA: Insufficient documentation

## 2016-05-04 MED ORDER — LORAZEPAM 0.5 MG PO TABS: ORAL_TABLET | ORAL | 0 refills | Status: DC

## 2016-05-04 NOTE — Progress Notes (Signed)
Radiation Oncology         [phone]) [phone] ________________________________  Name: [name] MRN: [id number]  Date: 05/04/2016  DOB: July 03, 1941  QV:ZDGLO,VFI A, MD  Gery Pray, MD     REFERRING PHYSICIAN: Gery Pray, MD   DIAGNOSIS: The primary encounter diagnosis was Multiple myeloma, remission status unspecified ([place]). Diagnoses of Brain metastases ([place]) and Mediastinal adenopathy were also pertinent to this visit.   HISTORY OF PRESENT ILLNESS: [name] is a 75 y.o. male seen at the request of Dr. [name] for a new diagnosis of metastatic brain lesions. He has a remote history of melanoma of his back over 10 years ago resected by dermatology with negative margins and no recurrences. He had two others also resected around this time. He was diagnosed with multiple myeloma in 2013, he has been on revelmid and in 2014 underwent stem cell transplant in 2014. In January 2016 he was seen with Dr. Sondra Come for treatment of a left occipital/parietal lytic skull lesion and he received 25 Gy to this  He has been since in remission and off consolidation treatment. He has been followed expectantly but in the last two weeks developed regular headaches, and unsteady gait. He was sent for CT of the brain on 04/27/16 which revealed enhancing lesions in the right cerebellum and right occipital bone. MRI of the brain that day revealed small bilateral cerebellar lesions measuring 2 mm, a right occipital lobe lesion measuring 5 mm, right parietal operculum lesion measuring 8 x 9 mm, a rigt superior cerebellum lesion with surrounding edema measuring 16 x 12 mm. His pituitary gland was enlarged at 10 mm with heterogeneous enhancement with diffuse thickening of the infundibulum. He was started on Dexamethasone 4 mg po q 6 hours. He underwent CT c/a/p and was found to have mediastinal adenopathy including a 1.2 cm right paratracheal node, a right hilar node measuring 1.8 cm, a subcarinal node measuring 1.5  cm, and another right hilar node measurin 1.7 cm. A small nodule in the left upper lobe was noted also measuring 51m. In the midst of this work up, the patient was being evaluated for polyuria and polydypsia by his PCP. He was started on desmopressin for his symptoms, and his case was reviewed with Dr. ELoanne Drilling No other intervention has been made. He comes today to discuss his metastatic disease to the brain. He will undergo PET tomorrow, and is scheduled for bronchoscopy next week to evaluate the mediastinal adenopathy.    PREVIOUS RADIATION THERAPY: Yes   07/21/14-08/05/14: 25 Gy to the left skull   PAST MEDICAL HISTORY:  Past Medical History:  Diagnosis Date  . Adenomatous polyp of colon 1996  . Arthritis    Osteoarthritis right knee, spine, wrist  . Arthritis of hand, right    Right thumb (Dr. SDaylene Katayama  . Back ache    r/o Multiple Myeloma  . Benign prostatic hypertrophy    (Dr. OKarsten Roin past)  . Degenerative disc disease   . Diverticulosis   . D[place]) ([place]   R calf  . Encounter for antineoplastic chemotherapy 10/20/2015  . Erosive esophagitis   . GERD (gastroesophageal reflux disease)   . Hemorrhoids   . Hiatal hernia   . History of chronic prostatitis   . History of melanoma    Back (Dr. DWilhemina Bonito; early melanoma R arm 03/2011  . Multiple myeloma ([facility] 2013   skull lytic lesions--treated with radiation 07/2014  . [place] [place]  .  Radiation 07/21/14-08/05/14   left occipital parietal skull 25 gray  . SCC (squamous cell carcinoma) 08/04/15   right ear;dysplastic nevus with severe atypia-chest       PAST SURGICAL HISTORY: Past Surgical History:  Procedure Laterality Date  . APPENDECTOMY    . BONE MARROW TRANSPLANT  03/2013   Stem Cell  . COLONOSCOPY  2009   adenomatous polyp  . ESOPHAGOGASTRODUODENOSCOPY  2009   mild inflammation at esophagogastric junction  . excision of melanoma     back ( x3)  . Great toe surgery      bilateral  . INGUINAL HERNIA REPAIR     left, x 2  . port placemet  10/2012  . TOTAL KNEE ARTHROPLASTY  06/08/2011   Procedure: TOTAL KNEE ARTHROPLASTY;  Surgeon: Ninetta Lights, MD;  Location: [facility];  Service: Orthopedics;  Laterality: Right;  osteonics  . total knee replaced  2000   Left knee     FAMILY HISTORY:  Family History  Problem Relation Age of Onset  . Dementia Mother   . Stroke Father     brainstem stroke  . Hypertension Father   . Diabetes Father   . Prostate cancer Maternal Grandfather   . Depression Daughter   . Anesthesia problems Neg Hx      SOCIAL HISTORY:  reports that he quit smoking about 39 years ago. His smoking use included Cigarettes. He has a 30.00 pack-year smoking history. He has never used smokeless tobacco. He reports that he drinks about 1.5 oz of alcohol per week . He reports that he does not use drugs. The patient is married and accompanied by his wife. He lives in [place] and has worked in [employer] for many years.    ALLERGIES: Amoxicillin and Penicillins   MEDICATIONS:  Current Outpatient Prescriptions  Medication Sig Dispense Refill  . acetaminophen (TYLENOL) 500 MG tablet Take 500 mg by mouth every 6 (six) hours as needed for moderate pain or headache.    Marland Kitchen ascorbic acid (VITAMIN C) 500 MG tablet Take 500 mg by mouth 2 (two) times daily.     . cholecalciferol (VITAMIN D) 1000 UNITS tablet Take 1,000 Units by mouth daily.    Marland Kitchen desmopressin (DDAVP) 0.1 MG tablet Take 1 tablet (0.1 mg total) by mouth at bedtime. 30 tablet 0  . dexamethasone (DECADRON) 4 MG tablet Take 1 tablet (4 mg total) by mouth every 6 (six) hours. (Patient taking differently: Take 4 mg by mouth every 6 (six) hours as needed (swelling). ) 40 tablet 0  . dicyclomine (BENTYL) 10 MG capsule Take 1 capsule (10 mg total) by mouth 3 (three) times daily before meals. (Patient taking differently: Take 10 mg by mouth daily as needed for spasms. ) 60 capsule 0  .  fish oil-omega-3 fatty acids 1000 MG capsule Take 1 g by mouth daily.     . furosemide (LASIX) 20 MG tablet Take 1 tablet (20 mg total) by mouth daily. (Patient taking differently: Take 20 mg by mouth daily as needed for fluid. ) 30 tablet 0  . LORazepam (ATIVAN) 0.5 MG tablet 1 tablet po 30 minutes prior to radiation or MRI 30 tablet 0  . methocarbamol (ROBAXIN) 500 MG tablet TAKE 1 TO 2 TABLETS BY MOUTH EVERY 6 HOURS AS NEEDED FOR MUSCLE SPASMS 60 tablet 0  . mirabegron ER (MYRBETRIQ) 25 MG TB24 tablet Take 1 tablet (25 mg total) by mouth daily. (Patient taking differently: Take 25 mg by mouth daily as  needed (bladder spasms). ) 7 tablet 0  . Multiple Vitamin (MULTIVITAMIN WITH MINERALS) TABS Take 1 tablet by mouth daily.    Marland Kitchen oxyCODONE (ROXICODONE) 5 MG immediate release tablet Take 1-3 tablets (5-15 mg total) by mouth every 4 (four) hours as needed for severe pain (Take 5 mg for mild pain, 10 mg for moderate pain, 15 mg for severe pain). 20 tablet 0  . oxyCODONE-acetaminophen (PERCOCET/ROXICET) 5-325 MG tablet Take 1 tablet by mouth every 4 (four) hours as needed. 30 tablet 0  . pantoprazole (PROTONIX) 40 MG tablet Take 1 tablet by mouth two  times daily 180 tablet 1  . Probiotic Product (ALIGN) 4 MG CAPS Take 1 capsule by mouth daily. Reported on 09/10/2015    . prochlorperazine (COMPAZINE) 10 MG tablet Take 1 tablet (10 mg total) by mouth every 6 (six) hours as needed. 30 tablet 1  . temazepam (RESTORIL) 15 MG capsule Take 1 capsule (15 mg total) by mouth at bedtime as needed. 60 capsule 0  . traMADol (ULTRAM) 50 MG tablet Take 1-2 tablets (50-100 mg total) by mouth every 8 (eight) hours as needed for severe pain. 30 tablet 0  . XARELTO 20 MG TABS tablet Take 1 tablet by mouth  daily 90 tablet 0  . zolendronic acid (ZOMETA) 4 MG/5ML injection Inject 4 mg into the vein every 3 (three) months.      No current facility-administered medications for this encounter.    Facility-Administered  Medications Ordered in Other Encounters  Medication Dose Route Frequency Provider Last Rate Last Dose  . ondansetron (ZOFRAN) tablet 8 mg  8 mg Oral Once Curt Bears, MD         REVIEW OF SYSTEMS: On review of systems, the patient reports that he is doing well overall. He reports soreness over the left scalp since his treatment in 2016. He reports unsteady feeling when standing and has noticed this in the last few months. This does not keep him from being active and he and his wife walk more than a mile per day. He denies any chest pain, shortness of breath, cough, fevers, chills, night sweats, unintended weight changes. He denies any bowel or bladder disturbances, and denies abdominal pain, nausea or vomiting. He denies any new musculoskeletal or joint aches or pains. A complete review of systems is obtained and is otherwise negative.     PHYSICAL EXAM:  Wt Readings from Last 3 Encounters:  05/04/16 215 lb 6.4 oz (97.7 kg)  05/03/16 212 lb (96.2 kg)  04/29/16 216 lb 9.6 oz (98.2 kg)   Temp Readings from Last 3 Encounters:  05/04/16 97.8 F (36.6 C) (Oral)  04/29/16 98 F (36.7 C) (Oral)  04/27/16 98.1 F (36.7 C) (Oral)   BP Readings from Last 3 Encounters:  05/04/16 (!) 142/81  05/03/16 111/81  04/29/16 130/76   Pulse Readings from Last 3 Encounters:  05/04/16 73  05/03/16 68  04/29/16 65     Pain scale 0/10 In general this is a well appearing Caucasian male in no acute distress. He is alert and oriented x4 and appropriate throughout the examination. HEENT reveals that the patient is normocephalic, atraumatic. EOMs are intact. PERRLA. Skin is intact without any evidence of gross lesions. Cardiovascular exam reveals a regular rate and rhythm, no clicks rubs or murmurs are auscultated. Chest is clear to auscultation bilaterally. Lymphatic assessment is performed and does not reveal any adenopathy in the cervical, supraclavicular, axillary, or inguinal chains. Abdomen has  active bowel sounds  in all quadrants and is intact. The abdomen is soft, non tender, non distended. Lower extremities are negative for pretibial pitting edema, deep calf tenderness, cyanosis or clubbing. Strength in the upper and lower extremities reveals equal strength that is 5/5. He has intact perception of light touch in upper and lower extremities bilaterally. Cranial nerves II-XII are grossly intact. No gait disturbances are noted, and no pronator drift is noted.   ECOG = 1  0 - Asymptomatic (Fully active, able to carry on all predisease activities without restriction)  1 - Symptomatic but completely ambulatory (Restricted in physically strenuous activity but ambulatory and able to carry out work of a light or sedentary nature. For example, light housework, office work)  2 - Symptomatic, <50% in bed during the day (Ambulatory and capable of all self care but unable to carry out any work activities. Up and about more than 50% of waking hours)  3 - Symptomatic, >50% in bed, but not bedbound (Capable of only limited self-care, confined to bed or chair 50% or more of waking hours)  4 - Bedbound (Completely disabled. Cannot carry on any self-care. Totally confined to bed or chair)  5 - Death   Eustace Pen MM, Creech RH, Tormey DC, et al. [id number]). "Toxicity and response criteria of the [facility]". [place] Oncol. 5 (6): 649-55    LABORATORY DATA:  Lab Results  Component Value Date   WBC 3.6 (L) 04/27/2016   HGB 13.3 04/27/2016   HCT 39.0 04/27/2016   MCV 91.9 04/27/2016   PLT 150 04/27/2016   Lab Results  Component Value Date   NA 136 04/27/2016   K 3.8 04/27/2016   CL 100 (L) 04/27/2016   CO2 24 04/27/2016   Lab Results  Component Value Date   ALT 31 04/27/2016   AST 30 04/27/2016   ALKPHOS 47 04/27/2016   BILITOT 0.4 04/27/2016      RADIOGRAPHY: Ct Head W Wo Contrast  Result Date: 04/27/2016 CLINICAL DATA:  Daily headaches. History of lytic  skull lesion and posterior calvarial radiation 2016. History of stem cell transplant in 2014. EXAM: CT HEAD WITHOUT AND WITH CONTRAST TECHNIQUE: Contiguous axial images were obtained from the base of the skull through the vertex without and with intravenous contrast CONTRAST:  62m ISOVUE-300 IOPAMIDOL intravenous COMPARISON:  04/16/2015 FINDINGS: Brain: Ovoid area of ring enhancement superior right cerebellum measuring up to 16 mm. There is a 6 mm focus of nodular enhancement in the parasagittal right occipital lobe. No evidence of acute infarct, hemorrhage, or hydrocephalus. Vascular: Major vessels are patent. Skull: Stable.  No progressed lucency.  No table erosion. Sinuses/Orbits: Bilateral cataract resection. Other: These results will be called to the ordering clinician or representative by the Radiologist Assistant, and communication documented in the PACS or zVision Dashboard. IMPRESSION: 1. Enhancing lesions in the right superior cerebellum and right occipital lobe, measuring up to 16 mm in the cerebellum. Metastasis (especially from patient's history of melanoma), subacute infarct, or radiation necrosis (history of posterior skull radiotherapy) are differential considerations. If no contraindication to MRI, recommend enhanced MRI follow-up. If subacute infarct is a clinical possibility, delaying the scan for 6 weeks would allow infarct related enhancement to subside. 2. Stable calvarium. Electronically Signed   By: JMonte FantasiaM.D.   On: 04/27/2016 10:27   Ct Chest W Contrast  Result Date: 04/28/2016 CLINICAL DATA:  History of multiple myeloma. Newly diagnosed brain metastases. EXAM: CT CHEST, ABDOMEN, AND PELVIS WITH CONTRAST TECHNIQUE:  Multidetector CT imaging of the chest, abdomen and pelvis was performed following the standard protocol during bolus administration of intravenous contrast. CONTRAST:  67m ISOVUE-300 IOPAMIDOL (ISOVUE-300) INJECTION 61%, 1046mISOVUE-300 IOPAMIDOL (ISOVUE-300)  INJECTION 61% COMPARISON:  02/07/2014 and abdomen and pelvis from 04/28/2014. FINDINGS: CT CHEST FINDINGS Cardiovascular: The heart size appears normal. There is no pericardial effusion. Mediastinum/Nodes: The trachea appears patent and is midline. Normal appearance of the esophagus. Right paratracheal lymph node measures 1.2 cm, image number 23 of series 2. There is a right hilar node which measures 1.8 cm, image 29 of series 2. Previously 0.7 cm. Sub- carinal lymph node measures 1.5 cm, image 30 of series 2. Previously 0.7 cm. Right hilar lymph node measures 1.7 cm, image 35 of series 2. Previously 1.0 cm. Lungs/Pleura: No pleural effusion. Small nodule in the left upper lobe is new from previous exam measuring 5 mm, image number 26 of series 4. Musculoskeletal: Spondylosis is identified within the thoracic spine. The bones appear osteopenic. CT ABDOMEN PELVIS FINDINGS Hepatobiliary: No focal liver abnormality is seen. No gallstones, gallbladder wall thickening, or biliary dilatation.The gallbladder is normal. No biliary dilatation Pancreas: Unremarkable. No pancreatic ductal dilatation or surrounding inflammatory changes. Spleen: Normal in size without focal abnormality. Adrenals/Urinary Tract: The adrenal glands are normal. There is a normal appearance of the right kidney. Scarring involving the upper pole the left kidney noted. The urinary bladder appears normal. Stomach/Bowel: The stomach is normal. The small bowel loops have a normal course and caliber. No pathologic dilatation of the colon. Vascular/Lymphatic: Calcified atherosclerotic disease involves the abdominal aorta. No aneurysm. No enlarged upper abdominal lymph nodes identified. No pelvic or inguinal adenopathy identified. Reproductive: Prostate is unremarkable. Other: No abdominal wall hernia or abnormality. No abdominopelvic ascites. Musculoskeletal: The bones appear diffusely osteopenic. There is degenerative disc disease and scoliosis within the  lumbar spine. IMPRESSION: 1. Interval development of mediastinal and hilar adenopathy. Findings may reflect primary bronchogenic carcinoma, metastatic adenopathy or lymphoproliferative disorder. In light of the patient's new brain metastasis primary bronchogenic carcinoma is favored. Suggest further workup with PET-CT and tissue sampling. Lymph nodes should be easily amendable to biopsy via bronchoscopy. 2. Aortic atherosclerosis. Electronically Signed   By: TaKerby Moors.D.   On: 04/28/2016 16:27   Mr BrJeri Cosnd Wo Contrast  Result Date: 04/27/2016 CLINICAL DATA:  History of multiple myeloma. Dizziness. Abnormal CT head EXAM: MRI HEAD WITHOUT AND WITH CONTRAST TECHNIQUE: Multiplanar, multiecho pulse sequences of the brain and surrounding structures were obtained without and with intravenous contrast. CONTRAST:  1912mULTIHANCE GADOBENATE DIMEGLUMINE 529 MG/ML IV SOLN COMPARISON:  CT head 04/27/2016, MRI 07/31/2013 FINDINGS: Brain: Multiple enhancing lesions are present in the brain compatible with metastatic disease. The largest lesion in the right superior cerebellum measures 16 x 12 mm with surrounding edema. Right parietal operculum lesion measures 8 x 9 mm. Right occipital lobe lesion measures 5 mm Small bilateral cerebellar enhancing lesions 2 mm each. Abnormal pituitary. Pituitary is enlarged and shows heterogeneous enhancement. There is also thickening and enhancement of the infundibulum diffusely. These findings were not present on the prior MRI 07/31/2013. Currently the pituitary measures 10 mm and height. Cavernous sinus normal. No compression of the optic chiasm. Generalized atrophy. Negative for hydrocephalus. Negative for acute infarct. No significant chronic ischemic change. No intracranial hemorrhage. Vascular: Normal arterial flow void. Skull and upper cervical spine: Negative Sinuses/Orbits: Mucosal edema in the paranasal sinuses. No orbital mass. Bilateral lens replacement. Other: None  IMPRESSION: Multiple enhancing mass  lesions in the brain most consistent with metastatic disease. Mild edema in the right cerebellum and right parietal operculum lesion. No shift of the midline structures. Abnormal pituitary which shows heterogeneous enhancement and is mildly enlarged measuring 10 mm. Thickening and enhancement of the infundibulum. Differential includes metastatic disease to the pituitary. Other possibilities include lymphocytic hypophysitis, sarcoid, pituitary adenoma. Electronically Signed   By: Franchot Gallo M.D.   On: 04/27/2016 15:27   Ct Abdomen Pelvis W Contrast  Result Date: 04/28/2016 CLINICAL DATA:  History of multiple myeloma. Newly diagnosed brain metastases. EXAM: CT CHEST, ABDOMEN, AND PELVIS WITH CONTRAST TECHNIQUE: Multidetector CT imaging of the chest, abdomen and pelvis was performed following the standard protocol during bolus administration of intravenous contrast. CONTRAST:  21m ISOVUE-300 IOPAMIDOL (ISOVUE-300) INJECTION 61%, 1066mISOVUE-300 IOPAMIDOL (ISOVUE-300) INJECTION 61% COMPARISON:  02/07/2014 and abdomen and pelvis from 04/28/2014. FINDINGS: CT CHEST FINDINGS Cardiovascular: The heart size appears normal. There is no pericardial effusion. Mediastinum/Nodes: The trachea appears patent and is midline. Normal appearance of the esophagus. Right paratracheal lymph node measures 1.2 cm, image number 23 of series 2. There is a right hilar node which measures 1.8 cm, image 29 of series 2. Previously 0.7 cm. Sub- carinal lymph node measures 1.5 cm, image 30 of series 2. Previously 0.7 cm. Right hilar lymph node measures 1.7 cm, image 35 of series 2. Previously 1.0 cm. Lungs/Pleura: No pleural effusion. Small nodule in the left upper lobe is new from previous exam measuring 5 mm, image number 26 of series 4. Musculoskeletal: Spondylosis is identified within the thoracic spine. The bones appear osteopenic. CT ABDOMEN PELVIS FINDINGS Hepatobiliary: No focal liver  abnormality is seen. No gallstones, gallbladder wall thickening, or biliary dilatation.The gallbladder is normal. No biliary dilatation Pancreas: Unremarkable. No pancreatic ductal dilatation or surrounding inflammatory changes. Spleen: Normal in size without focal abnormality. Adrenals/Urinary Tract: The adrenal glands are normal. There is a normal appearance of the right kidney. Scarring involving the upper pole the left kidney noted. The urinary bladder appears normal. Stomach/Bowel: The stomach is normal. The small bowel loops have a normal course and caliber. No pathologic dilatation of the colon. Vascular/Lymphatic: Calcified atherosclerotic disease involves the abdominal aorta. No aneurysm. No enlarged upper abdominal lymph nodes identified. No pelvic or inguinal adenopathy identified. Reproductive: Prostate is unremarkable. Other: No abdominal wall hernia or abnormality. No abdominopelvic ascites. Musculoskeletal: The bones appear diffusely osteopenic. There is degenerative disc disease and scoliosis within the lumbar spine. IMPRESSION: 1. Interval development of mediastinal and hilar adenopathy. Findings may reflect primary bronchogenic carcinoma, metastatic adenopathy or lymphoproliferative disorder. In light of the patient's new brain metastasis primary bronchogenic carcinoma is favored. Suggest further workup with PET-CT and tissue sampling. Lymph nodes should be easily amendable to biopsy via bronchoscopy. 2. Aortic atherosclerosis. Electronically Signed   By: TaKerby Moors.D.   On: 04/28/2016 16:27       IMPRESSION/PLAN: 1. Metastatic brain lesions with a history of myeloma, and mediastinal adenopathy. The patient also has a history of melanoma as well in the past. He is in the process of continued work up of the medistinal adenopathy with bronchoscopy under the care of Dr. GeServando Snaren 05/10/16. He will also have  PET scan tomorrow, and 3T MRI scan of the brain on 05/12/16. Systemic therapy will  be directed based on the information gathered from his continued work up. With regard to the brain metastases, Dr. MoLisbeth Renshawas recommended obtaining tissue confirmation of the primary, and offers the patient 3T  MRI and likely SRS treatment. We discussed the risks, benefits, short and long term effects as well as delivery and logistics of treatment. He is interested in proceeding. We will continue to discuss his imaging and pathology findings as we determine eligibility for SRS. A prescription is given for Ativan po prior to MRI or radiotherapy treatments as he admits to feeling claustrophobic during MRI scans. The medication side effects and administration are reviewed and a new prescription is called to his pharmacy. We also discussed beginning to taper his dexamethasone. He will change to 34m dexamethasone QID, to TID starting this Friday. He will keep uKoreainformed of any questions or concerns.  2. Polyuria/Polydipsia with concerns for diabetes insipidus. The patient has been worked up by his PCP and Dr. ELoanne Drillinghas weighed in on his case as well. We will defer continued work up and role for desmopressin to Dr. KTomi Bambergerand Dr. NSherwood Gamblerfor management of this.    The above documentation reflects my direct findings during this shared patient visit. Please see the separate note by Dr. MLisbeth Renshawon this date for the remainder of the patient's plan of care.    ACarola Rhine PAC

## 2016-05-05 ENCOUNTER — Telehealth: Payer: Self-pay | Admitting: Radiation Oncology

## 2016-05-05 ENCOUNTER — Encounter (HOSPITAL_COMMUNITY)
Admission: RE | Admit: 2016-05-05 | Discharge: 2016-05-05 | Disposition: A | Discharge status: Home / Self Care | Payer: Medicare Other | Source: Ambulatory Visit | Attending: Internal Medicine | Admitting: Internal Medicine

## 2016-05-05 ENCOUNTER — Encounter (HOSPITAL_COMMUNITY): Payer: Medicare Other

## 2016-05-05 ENCOUNTER — Ambulatory Visit: Payer: Self-pay

## 2016-05-05 DIAGNOSIS — R591 Generalized enlarged lymph nodes: Secondary | ICD-10-CM | POA: Insufficient documentation

## 2016-05-05 DIAGNOSIS — E237 Disorder of pituitary gland, unspecified: Secondary | ICD-10-CM

## 2016-05-05 DIAGNOSIS — C9000 Multiple myeloma not having achieved remission: Secondary | ICD-10-CM | POA: Diagnosis not present

## 2016-05-05 LAB — GLUCOSE, CAPILLARY: GLUCOSE-CAPILLARY: 103 mg/dL — AB (ref 65–99)

## 2016-05-05 MED ORDER — FLUDEOXYGLUCOSE F - 18 (FDG) INJECTION
11.2000 | Freq: Once | INTRAVENOUS | Status: AC | PRN
  Administered 2016-05-05: 11.2 via INTRAVENOUS

## 2016-05-05 NOTE — Telephone Encounter (Signed)
I spoke with the patient to let him know Dr. Sherwood Gambler would like some additional blood work and some of it repeated given the findings of the thickening of the pituitary stalk on MRI. The patient will have these drawn next Monday when he comes for his appointment with Dr. [name].

## 2016-05-06 ENCOUNTER — Telehealth: Payer: Self-pay | Admitting: *Deleted

## 2016-05-06 NOTE — Telephone Encounter (Signed)
CALLED PATIENT TO INFORM THAT HE NEEDS TO FAST FOR LABS ON 05-09-16, NPO AFTER MIDNIGHT, LVM FOR A RETURN CALL

## 2016-05-09 ENCOUNTER — Ambulatory Visit (HOSPITAL_BASED_OUTPATIENT_CLINIC_OR_DEPARTMENT_OTHER): Payer: Medicare Other | Admitting: Internal Medicine

## 2016-05-09 ENCOUNTER — Ambulatory Visit (HOSPITAL_COMMUNITY)
Admission: RE | Admit: 2016-05-09 | Discharge: 2016-05-09 | Disposition: A | Discharge status: Home / Self Care | Payer: Medicare Other | Source: Ambulatory Visit | Attending: Cardiothoracic Surgery | Admitting: Cardiothoracic Surgery

## 2016-05-09 ENCOUNTER — Encounter (HOSPITAL_COMMUNITY): Payer: Self-pay

## 2016-05-09 ENCOUNTER — Encounter: Payer: Self-pay | Admitting: Internal Medicine

## 2016-05-09 ENCOUNTER — Other Ambulatory Visit: Payer: Self-pay | Admitting: Medical Oncology

## 2016-05-09 ENCOUNTER — Other Ambulatory Visit: Payer: Medicare Other

## 2016-05-09 ENCOUNTER — Encounter: Payer: Self-pay | Admitting: *Deleted

## 2016-05-09 ENCOUNTER — Other Ambulatory Visit: Payer: Self-pay

## 2016-05-09 ENCOUNTER — Encounter (HOSPITAL_COMMUNITY)
Admission: RE | Admit: 2016-05-09 | Discharge: 2016-05-09 | Disposition: A | Discharge status: Home / Self Care | Payer: Medicare Other | Source: Ambulatory Visit | Attending: Cardiothoracic Surgery | Admitting: Cardiothoracic Surgery

## 2016-05-09 ENCOUNTER — Ambulatory Visit: Payer: Medicare Other

## 2016-05-09 VITALS — BP 122/94 | HR 93 | Temp 98.4°F | Resp 18 | Ht 69.0 in | Wt 203.3 lb

## 2016-05-09 DIAGNOSIS — E237 Disorder of pituitary gland, unspecified: Secondary | ICD-10-CM | POA: Diagnosis not present

## 2016-05-09 DIAGNOSIS — C9000 Multiple myeloma not having achieved remission: Secondary | ICD-10-CM

## 2016-05-09 DIAGNOSIS — C801 Malignant (primary) neoplasm, unspecified: Secondary | ICD-10-CM

## 2016-05-09 DIAGNOSIS — Z86711 Personal history of pulmonary embolism: Secondary | ICD-10-CM

## 2016-05-09 DIAGNOSIS — I82441 Acute embolism and thrombosis of right tibial vein: Secondary | ICD-10-CM

## 2016-05-09 DIAGNOSIS — C787 Secondary malignant neoplasm of liver and intrahepatic bile duct: Secondary | ICD-10-CM

## 2016-05-09 DIAGNOSIS — M549 Dorsalgia, unspecified: Secondary | ICD-10-CM | POA: Diagnosis not present

## 2016-05-09 DIAGNOSIS — Z7901 Long term (current) use of anticoagulants: Secondary | ICD-10-CM

## 2016-05-09 DIAGNOSIS — Z5111 Encounter for antineoplastic chemotherapy: Secondary | ICD-10-CM

## 2016-05-09 DIAGNOSIS — R591 Generalized enlarged lymph nodes: Secondary | ICD-10-CM

## 2016-05-09 DIAGNOSIS — C7931 Secondary malignant neoplasm of brain: Secondary | ICD-10-CM

## 2016-05-09 DIAGNOSIS — G620 Drug-induced polyneuropathy: Secondary | ICD-10-CM

## 2016-05-09 DIAGNOSIS — G4700 Insomnia, unspecified: Secondary | ICD-10-CM

## 2016-05-09 DIAGNOSIS — Z01812 Encounter for preprocedural laboratory examination: Secondary | ICD-10-CM | POA: Insufficient documentation

## 2016-05-09 DIAGNOSIS — R599 Enlarged lymph nodes, unspecified: Secondary | ICD-10-CM

## 2016-05-09 DIAGNOSIS — Z01818 Encounter for other preprocedural examination: Secondary | ICD-10-CM | POA: Diagnosis not present

## 2016-05-09 LAB — COMPREHENSIVE METABOLIC PANEL
ALT: 37 U/L (ref 17–63)
AST: 23 U/L (ref 15–41)
Albumin: 3.6 g/dL (ref 3.5–5.0)
Alkaline Phosphatase: 48 U/L (ref 38–126)
Anion gap: 8 (ref 5–15)
BUN: 22 mg/dL — ABNORMAL HIGH (ref 6–20)
CO2: 25 mmol/L (ref 22–32)
Calcium: 9.6 mg/dL (ref 8.9–10.3)
Chloride: 115 mmol/L — ABNORMAL HIGH (ref 101–111)
Creatinine, Ser: 1.03 mg/dL (ref 0.61–1.24)
GFR calc Af Amer: >60 mL/min (ref >60)
GFR calc non Af Amer: >60 mL/min (ref >60)
Glucose, Bld: 133 mg/dL — ABNORMAL HIGH (ref 65–99)
Potassium: 4 mmol/L (ref 3.5–5.1)
Sodium: 148 mmol/L — ABNORMAL HIGH (ref 135–145)
Total Bilirubin: 1.1 mg/dL (ref 0.3–1.2)
Total Protein: 6.5 g/dL (ref 6.5–8.1)

## 2016-05-09 LAB — CBC
HCT: 46.3 % (ref 39.0–52.0)
Hemoglobin: 15.3 g/dL (ref 13.0–17.0)
MCH: 31.9 pg (ref 26.0–34.0)
MCHC: 33 g/dL (ref 30.0–36.0)
MCV: 96.5 fL (ref 78.0–100.0)
Platelets: 174 10*3/uL (ref 150–400)
RBC: 4.8 MIL/uL (ref 4.22–5.81)
RDW: 16.1 % — ABNORMAL HIGH (ref 11.5–15.5)
WBC: 6.2 10*3/uL (ref 4.0–10.5)

## 2016-05-09 LAB — LACTATE DEHYDROGENASE: LDH: 180 U/L (ref 125–245)

## 2016-05-09 LAB — URINALYSIS, MICROSCOPIC - CHCC
BLOOD: NEGATIVE
Bilirubin (Urine): NEGATIVE
GLUCOSE UR CHCC: NEGATIVE mg/dL
Ketones: NEGATIVE mg/dL
Leukocyte Esterase: NEGATIVE
NITRITE: NEGATIVE
PH: 5 (ref 4.6–8.0)
Protein: NEGATIVE mg/dL
Specific Gravity, Urine: 1.005 (ref 1.003–1.035)
UROBILINOGEN UR: 0.2 mg/dL (ref 0.2–1)
WBC, UA: NEGATIVE (ref 0–2)

## 2016-05-09 LAB — PROTIME-INR
INR: 0.92
Prothrombin Time: 12.3 seconds (ref 11.4–15.2)

## 2016-05-09 LAB — APTT: aPTT: 22 seconds — ABNORMAL LOW (ref 24–36)

## 2016-05-09 NOTE — Progress Notes (Signed)
Oncology Nurse Navigator Documentation  Oncology Nurse Navigator Flowsheets 05/09/2016  Navigator Location [facility]  Navigator Encounter Type Clinic/MDC/spoke with patient today at clinic. He is doing well. He is waiting to have tissue DX.  He is set up for bronchoscopy on 05/11/16.  Updated him on follow up after procedure.    Patient Visit Type MedOnc  Treatment Phase Abnormal Scans  Barriers/Navigation Needs Education  Education Other  Interventions Education  Education Method Verbal  Acuity Level 1  Acuity Level 1 Minimal follow up required  Time Spent with Patient 30

## 2016-05-09 NOTE — Addendum Note (Signed)
Encounter addended by: Benn Moulder, RN on: 05/09/2016  6:22 PM<BR>    Actions taken: Charge Capture section accepted

## 2016-05-09 NOTE — Progress Notes (Signed)
[place] [phone]   [phone]  OFFICE PROGRESS NOTE  D[place], MD [address] [place] [address]  DIAGNOSIS:  1) questionable metastatic carcinoma with multiple brain metastasis as well as mediastinal lymphadenopathy, pending tissue diagnosis. 2) Stage IIA multiple myeloma diagnosed in July of 2013.   PRIOR THERAPY:  1) Systemic chemotherapy with subcutaneous Velcade 1.3 mg/M2 on days 1, 4, 8 and 11 every 3 weeks in addition to Revlimid 25 mg by mouth daily for 14 days every 3 weeks and dexamethasone 40 mg on a weekly basis. Status post 4 planned cycles.  2) Systemic chemotherapy with subcutaneous Velcade 1.3 mg/M2 on days 1, 4, 8 and 11 every 3 weeks in addition to Revlimid 25 mg by mouth daily for 14 days every 3 weeks and dexamethasone 40 mg on a weekly basis. 4 more cycles are planned and he is status post 4 cycles of the second set of 4 cycles, now status post a total of 10 cycles. 3) Systemic chemotherapy with Carfilzomib 36 mg/M2 on days 1, 2, 8, 9, 15 and 16 in addition to cyclophosphamide 300 mg/M2 IV on days 1, 8 and 15 as well as dexamethasone on days 1, 8, 15 and 22 every 4 weeks. Status post 4 cycles. 4) Zometa 4 mg IV every month. 5) autologous peripheral blood stem cell transplant on 03/20/2013 at [facility]. 6) Consolidation chemotherapy with Carfilzomib 36 mg/M2 on days 1, 2, 8, 9, 15 and 16 every 4 weeks in addition to cyclophosphamide 300 mg/M2 on days 1, 8 and 15 as well as dexamethasone 40 mg on a weekly basis every 4 weeks. Status post 2 cycles. First cycle 06/11/2013. 7) Maintenance therapy with Revlimid 10 mg by mouth daily status post 3 months of treatment. 8) palliative radiotherapy to the lytic lesion in the skull under the care of Dr. Sondra Come 9) Maintenance therapy with Revlimid 15 mg by mouth daily started 11/09/2013 and discontinued 02/18/2015 secondary to persistent neutropenia.   CURRENT  THERAPY:  1) Maintenance therapy with Revlimid 10 mg by mouth daily started 02/19/2015. currently on hold starting today.  2) Zometa 4 mg IV every 3 months. 3) Xarelto 20 mg by mouth daily for pulmonary embolism.   INTERVAL HISTORY: [name] 75 y.o. male returns to the clinic today for routine monthly followup visit. The patient is feeling fine today except for heartburn from the steroids. He denied having any chest pain, shortness of breath, cough or hemoptysis. He notices some protrusion of the sternum. He denied having any significant weight loss or night sweats. He has no nausea or vomiting. He was seen by Dr. Lisbeth Renshaw and expected to have stereotactic radiotherapy to the metastatic brain lesion soon. He was also seen by Dr. Madolyn Frieze and expected to undergo excisional biopsy of right supraclavicular lymph node or bronchoscopy with endobronchial ultrasound for tissue diagnosis on 05/11/2016. The patient had a PET scan performed recently and he is here for evaluation and discussion of his scan results.  MEDICAL HISTORY: Past Medical History:  Diagnosis Date  . Adenomatous polyp of colon 1996  . Arthritis    Osteoarthritis right knee, spine, wrist  . Arthritis of hand, right    Right thumb (Dr. Daylene Katayama)  . Back ache    r/o Multiple Myeloma  . Benign prostatic hypertrophy    (Dr. Karsten Ro in past)  . Degenerative disc disease   . Diverticulosis   . DVT (deep venous thrombosis) ([place])  01/2014   R calf  . Encounter for antineoplastic chemotherapy 10/20/2015  . Erosive esophagitis   . GERD (gastroesophageal reflux disease)   . Hemorrhoids   . Hiatal hernia   . History of chronic prostatitis   . History of melanoma    Back (Dr. Wilhemina Bonito); early melanoma R arm 03/2011  . Multiple myeloma ([place]) 2013   skull lytic lesions--treated with radiation 07/2014  . Pulmonary embolism ([place]) 01/2014  . Radiation 07/21/14-08/05/14   left occipital parietal skull 25 gray  . SCC (squamous cell carcinoma)  08/04/15   right ear;dysplastic nevus with severe atypia-chest    ALLERGIES:  is allergic to amoxicillin and penicillins.  MEDICATIONS:  Current Outpatient Prescriptions  Medication Sig Dispense Refill  . acetaminophen (TYLENOL) 500 MG tablet Take 500 mg by mouth every 6 (six) hours as needed for moderate pain or headache.    Marland Kitchen ascorbic acid (VITAMIN C) 500 MG tablet Take 500 mg by mouth 2 (two) times daily.     . cholecalciferol (VITAMIN D) 1000 UNITS tablet Take 1,000 Units by mouth daily.    Marland Kitchen desmopressin (DDAVP) 0.1 MG tablet Take 1 tablet (0.1 mg total) by mouth at bedtime. 30 tablet 0  . dicyclomine (BENTYL) 10 MG capsule Take 1 capsule (10 mg total) by mouth 3 (three) times daily before meals. (Patient taking differently: Take 10 mg by mouth daily as needed for spasms. ) 60 capsule 0  . fish oil-omega-3 fatty acids 1000 MG capsule Take 1 g by mouth daily.     . furosemide (LASIX) 20 MG tablet Take 1 tablet (20 mg total) by mouth daily. (Patient taking differently: Take 20 mg by mouth daily as needed for fluid. ) 30 tablet 0  . LORazepam (ATIVAN) 0.5 MG tablet 1 tablet po 30 minutes prior to radiation or MRI 30 tablet 0  . methocarbamol (ROBAXIN) 500 MG tablet TAKE 1 TO 2 TABLETS BY MOUTH EVERY 6 HOURS AS NEEDED FOR MUSCLE SPASMS 60 tablet 0  . mirabegron ER (MYRBETRIQ) 25 MG TB24 tablet Take 1 tablet (25 mg total) by mouth daily. (Patient taking differently: Take 25 mg by mouth daily as needed (bladder spasms). ) 7 tablet 0  . Multiple Vitamin (MULTIVITAMIN WITH MINERALS) TABS Take 1 tablet by mouth daily.    Marland Kitchen oxyCODONE (ROXICODONE) 5 MG immediate release tablet Take 1-3 tablets (5-15 mg total) by mouth every 4 (four) hours as needed for severe pain (Take 5 mg for mild pain, 10 mg for moderate pain, 15 mg for severe pain). 20 tablet 0  . oxyCODONE-acetaminophen (PERCOCET/ROXICET) 5-325 MG tablet Take 1 tablet by mouth every 4 (four) hours as needed. 30 tablet 0  . pantoprazole  (PROTONIX) 40 MG tablet Take 1 tablet by mouth two  times daily 180 tablet 1  . Probiotic Product (ALIGN) 4 MG CAPS Take 1 capsule by mouth daily. Reported on 09/10/2015    . prochlorperazine (COMPAZINE) 10 MG tablet Take 1 tablet (10 mg total) by mouth every 6 (six) hours as needed. 30 tablet 1  . temazepam (RESTORIL) 15 MG capsule Take 1 capsule (15 mg total) by mouth at bedtime as needed. 60 capsule 0  . traMADol (ULTRAM) 50 MG tablet Take 1-2 tablets (50-100 mg total) by mouth every 8 (eight) hours as needed for severe pain. 30 tablet 0  . XARELTO 20 MG TABS tablet Take 1 tablet by mouth  daily 90 tablet 0  . zolendronic acid (ZOMETA) 4 MG/5ML injection Inject 4 mg  into the vein every 3 (three) months.      No current facility-administered medications for this visit.    Facility-Administered Medications Ordered in Other Visits  Medication Dose Route Frequency Provider Last Rate Last Dose  . ondansetron (ZOFRAN) tablet 8 mg  8 mg Oral Once Si Gaul, MD        SURGICAL HISTORY:  Past Surgical History:  Procedure Laterality Date  . APPENDECTOMY    . BONE MARROW TRANSPLANT  03/2013   Stem Cell  . COLONOSCOPY  2009   adenomatous polyp  . ESOPHAGOGASTRODUODENOSCOPY  2009   mild inflammation at esophagogastric junction  . excision of melanoma     back ( x3)  . Great toe surgery     bilateral  . INGUINAL HERNIA REPAIR     left, x 2  . port placemet  10/2012  . TOTAL KNEE ARTHROPLASTY  06/08/2011   Procedure: TOTAL KNEE ARTHROPLASTY;  Surgeon: Loreta Ave, MD;  Location: [facility] OR;  Service: Orthopedics;  Laterality: Right;  osteonics  . total knee replaced  2000   Left knee    REVIEW OF SYSTEMS:  Constitutional: positive for fatigue Eyes: negative Ears, nose, mouth, throat, and face: negative Respiratory: negative Cardiovascular: negative Gastrointestinal: positive for nausea Genitourinary:positive for frequency Integument/breast: negative Hematologic/lymphatic:  negative Musculoskeletal:positive for arthralgias Neurological: positive for headaches Behavioral/Psych: negative Endocrine: negative Allergic/Immunologic: negative   PHYSICAL EXAMINATION: General appearance: alert, cooperative and no distress Head: Normocephalic, without obvious abnormality, atraumatic, Ecchymosis at the right frontal area of the face. Neck: no adenopathy, no JVD, supple, symmetrical, trachea midline and thyroid not enlarged, symmetric, no tenderness/mass/nodules Lymph nodes: Cervical, supraclavicular, and axillary nodes normal. Resp: clear to auscultation bilaterally Back: symmetric, no curvature. ROM normal. No CVA tenderness. Cardio: regular rate and rhythm, S1, S2 normal, no murmur, click, rub or gallop GI: soft, non-tender; bowel sounds normal; no masses,  no organomegaly Extremities: extremities normal, atraumatic, no cyanosis or edema Neurologic: Alert and oriented X 3, normal strength and tone. Normal symmetric reflexes. Normal coordination and gait  ECOG PERFORMANCE STATUS: 1 - Symptomatic but completely ambulatory  Blood pressure (!) 122/94, pulse 93, temperature 98.4 F (36.9 C), temperature source Oral, resp. rate 18, height 5\' 9"  (1.753 m), weight 203 lb 4.8 oz (92.2 kg), SpO2 98 %.  LABORATORY DATA: Lab Results  Component Value Date   WBC 6.2 05/09/2016   HGB 15.3 05/09/2016   HCT 46.3 05/09/2016   MCV 96.5 05/09/2016   PLT 174 05/09/2016      Chemistry      Component Value Date/Time   NA 148 (H) 05/09/2016 0828   NA 144 03/15/2016 0912   K 4.0 05/09/2016 0828   K 3.7 03/15/2016 0912   CL 115 (H) 05/09/2016 0828   CL 109 (H) 12/31/2012 0759   CO2 25 05/09/2016 0828   CO2 24 03/15/2016 0912   BUN 22 (H) 05/09/2016 0828   BUN 10.8 03/15/2016 0912   CREATININE 1.03 05/09/2016 0828   CREATININE 1.11 04/25/2016 1514   CREATININE 0.9 03/15/2016 0912      Component Value Date/Time   CALCIUM 9.6 05/09/2016 0828   CALCIUM 9.3 03/15/2016 0912    ALKPHOS 48 05/09/2016 0828   ALKPHOS 51 03/15/2016 0912   AST 23 05/09/2016 0828   AST 19 03/15/2016 0912   ALT 37 05/09/2016 0828   ALT 18 03/15/2016 0912   BILITOT 1.1 05/09/2016 0828   BILITOT 0.97 03/15/2016 0912      RADIOGRAPHIC  STUDIES: Dg Chest 2 View  Result Date: 05/09/2016 CLINICAL DATA:  Preop chest x-ray EXAM: CHEST  2 VIEW COMPARISON:  05/05/2016 FINDINGS: Normal heart size and stable aortic tortuosity. Thoracic adenopathy on previous CT are subtle. Porta catheter on the right with tip at the SVC level. There is no edema, consolidation, effusion, or pneumothorax. Spondylosis. No acute osseous finding. IMPRESSION: No active cardiopulmonary disease. Electronically Signed   By: Monte Fantasia M.D.   On: 05/09/2016 08:43   Ct Head W Wo Contrast  Result Date: 04/27/2016 CLINICAL DATA:  Daily headaches. History of lytic skull lesion and posterior calvarial radiation 2016. History of stem cell transplant in 2014. EXAM: CT HEAD WITHOUT AND WITH CONTRAST TECHNIQUE: Contiguous axial images were obtained from the base of the skull through the vertex without and with intravenous contrast CONTRAST:  44m ISOVUE-300 IOPAMIDOL intravenous COMPARISON:  04/16/2015 FINDINGS: Brain: Ovoid area of ring enhancement superior right cerebellum measuring up to 16 mm. There is a 6 mm focus of nodular enhancement in the parasagittal right occipital lobe. No evidence of acute infarct, hemorrhage, or hydrocephalus. Vascular: Major vessels are patent. Skull: Stable.  No progressed lucency.  No table erosion. Sinuses/Orbits: Bilateral cataract resection. Other: These results will be called to the ordering clinician or representative by the Radiologist Assistant, and communication documented in the PACS or zVision Dashboard. IMPRESSION: 1. Enhancing lesions in the right superior cerebellum and right occipital lobe, measuring up to 16 mm in the cerebellum. Metastasis (especially from patient's history of  melanoma), subacute infarct, or radiation necrosis (history of posterior skull radiotherapy) are differential considerations. If no contraindication to MRI, recommend enhanced MRI follow-up. If subacute infarct is a clinical possibility, delaying the scan for 6 weeks would allow infarct related enhancement to subside. 2. Stable calvarium. Electronically Signed   By: JMonte FantasiaM.D.   On: 04/27/2016 10:27   Ct Chest W Contrast  Result Date: 04/28/2016 CLINICAL DATA:  History of multiple myeloma. Newly diagnosed brain metastases. EXAM: CT CHEST, ABDOMEN, AND PELVIS WITH CONTRAST TECHNIQUE: Multidetector CT imaging of the chest, abdomen and pelvis was performed following the standard protocol during bolus administration of intravenous contrast. CONTRAST:  358mISOVUE-300 IOPAMIDOL (ISOVUE-300) INJECTION 61%, 10066mSOVUE-300 IOPAMIDOL (ISOVUE-300) INJECTION 61% COMPARISON:  02/07/2014 and abdomen and pelvis from 04/28/2014. FINDINGS: CT CHEST FINDINGS Cardiovascular: The heart size appears normal. There is no pericardial effusion. Mediastinum/Nodes: The trachea appears patent and is midline. Normal appearance of the esophagus. Right paratracheal lymph node measures 1.2 cm, image number 23 of series 2. There is a right hilar node which measures 1.8 cm, image 29 of series 2. Previously 0.7 cm. Sub- carinal lymph node measures 1.5 cm, image 30 of series 2. Previously 0.7 cm. Right hilar lymph node measures 1.7 cm, image 35 of series 2. Previously 1.0 cm. Lungs/Pleura: No pleural effusion. Small nodule in the left upper lobe is new from previous exam measuring 5 mm, image number 26 of series 4. Musculoskeletal: Spondylosis is identified within the thoracic spine. The bones appear osteopenic. CT ABDOMEN PELVIS FINDINGS Hepatobiliary: No focal liver abnormality is seen. No gallstones, gallbladder wall thickening, or biliary dilatation.The gallbladder is normal. No biliary dilatation Pancreas: Unremarkable. No  pancreatic ductal dilatation or surrounding inflammatory changes. Spleen: Normal in size without focal abnormality. Adrenals/Urinary Tract: The adrenal glands are normal. There is a normal appearance of the right kidney. Scarring involving the upper pole the left kidney noted. The urinary bladder appears normal. Stomach/Bowel: The stomach is normal. The small  bowel loops have a normal course and caliber. No pathologic dilatation of the colon. Vascular/Lymphatic: Calcified atherosclerotic disease involves the abdominal aorta. No aneurysm. No enlarged upper abdominal lymph nodes identified. No pelvic or inguinal adenopathy identified. Reproductive: Prostate is unremarkable. Other: No abdominal wall hernia or abnormality. No abdominopelvic ascites. Musculoskeletal: The bones appear diffusely osteopenic. There is degenerative disc disease and scoliosis within the lumbar spine. IMPRESSION: 1. Interval development of mediastinal and hilar adenopathy. Findings may reflect primary bronchogenic carcinoma, metastatic adenopathy or lymphoproliferative disorder. In light of the patient's new brain metastasis primary bronchogenic carcinoma is favored. Suggest further workup with PET-CT and tissue sampling. Lymph nodes should be easily amendable to biopsy via bronchoscopy. 2. Aortic atherosclerosis. Electronically Signed   By: Kerby Moors M.D.   On: 04/28/2016 16:27   Mr Jeri Cos And Wo Contrast  Result Date: 04/27/2016 CLINICAL DATA:  History of multiple myeloma. Dizziness. Abnormal CT head EXAM: MRI HEAD WITHOUT AND WITH CONTRAST TECHNIQUE: Multiplanar, multiecho pulse sequences of the brain and surrounding structures were obtained without and with intravenous contrast. CONTRAST:  79m MULTIHANCE GADOBENATE DIMEGLUMINE 529 MG/ML IV SOLN COMPARISON:  CT head 04/27/2016, MRI 07/31/2013 FINDINGS: Brain: Multiple enhancing lesions are present in the brain compatible with metastatic disease. The largest lesion in the right  superior cerebellum measures 16 x 12 mm with surrounding edema. Right parietal operculum lesion measures 8 x 9 mm. Right occipital lobe lesion measures 5 mm Small bilateral cerebellar enhancing lesions 2 mm each. Abnormal pituitary. Pituitary is enlarged and shows heterogeneous enhancement. There is also thickening and enhancement of the infundibulum diffusely. These findings were not present on the prior MRI 07/31/2013. Currently the pituitary measures 10 mm and height. Cavernous sinus normal. No compression of the optic chiasm. Generalized atrophy. Negative for hydrocephalus. Negative for acute infarct. No significant chronic ischemic change. No intracranial hemorrhage. Vascular: Normal arterial flow void. Skull and upper cervical spine: Negative Sinuses/Orbits: Mucosal edema in the paranasal sinuses. No orbital mass. Bilateral lens replacement. Other: None IMPRESSION: Multiple enhancing mass lesions in the brain most consistent with metastatic disease. Mild edema in the right cerebellum and right parietal operculum lesion. No shift of the midline structures. Abnormal pituitary which shows heterogeneous enhancement and is mildly enlarged measuring 10 mm. Thickening and enhancement of the infundibulum. Differential includes metastatic disease to the pituitary. Other possibilities include lymphocytic hypophysitis, sarcoid, pituitary adenoma. Electronically Signed   By: CFranchot GalloM.D.   On: 04/27/2016 15:27   Ct Abdomen Pelvis W Contrast  Result Date: 04/28/2016 CLINICAL DATA:  History of multiple myeloma. Newly diagnosed brain metastases. EXAM: CT CHEST, ABDOMEN, AND PELVIS WITH CONTRAST TECHNIQUE: Multidetector CT imaging of the chest, abdomen and pelvis was performed following the standard protocol during bolus administration of intravenous contrast. CONTRAST:  342mISOVUE-300 IOPAMIDOL (ISOVUE-300) INJECTION 61%, 10050mSOVUE-300 IOPAMIDOL (ISOVUE-300) INJECTION 61% COMPARISON:  02/07/2014 and abdomen  and pelvis from 04/28/2014. FINDINGS: CT CHEST FINDINGS Cardiovascular: The heart size appears normal. There is no pericardial effusion. Mediastinum/Nodes: The trachea appears patent and is midline. Normal appearance of the esophagus. Right paratracheal lymph node measures 1.2 cm, image number 23 of series 2. There is a right hilar node which measures 1.8 cm, image 29 of series 2. Previously 0.7 cm. Sub- carinal lymph node measures 1.5 cm, image 30 of series 2. Previously 0.7 cm. Right hilar lymph node measures 1.7 cm, image 35 of series 2. Previously 1.0 cm. Lungs/Pleura: No pleural effusion. Small nodule in the left upper lobe is  new from previous exam measuring 5 mm, image number 26 of series 4. Musculoskeletal: Spondylosis is identified within the thoracic spine. The bones appear osteopenic. CT ABDOMEN PELVIS FINDINGS Hepatobiliary: No focal liver abnormality is seen. No gallstones, gallbladder wall thickening, or biliary dilatation.The gallbladder is normal. No biliary dilatation Pancreas: Unremarkable. No pancreatic ductal dilatation or surrounding inflammatory changes. Spleen: Normal in size without focal abnormality. Adrenals/Urinary Tract: The adrenal glands are normal. There is a normal appearance of the right kidney. Scarring involving the upper pole the left kidney noted. The urinary bladder appears normal. Stomach/Bowel: The stomach is normal. The small bowel loops have a normal course and caliber. No pathologic dilatation of the colon. Vascular/Lymphatic: Calcified atherosclerotic disease involves the abdominal aorta. No aneurysm. No enlarged upper abdominal lymph nodes identified. No pelvic or inguinal adenopathy identified. Reproductive: Prostate is unremarkable. Other: No abdominal wall hernia or abnormality. No abdominopelvic ascites. Musculoskeletal: The bones appear diffusely osteopenic. There is degenerative disc disease and scoliosis within the lumbar spine. IMPRESSION: 1. Interval development  of mediastinal and hilar adenopathy. Findings may reflect primary bronchogenic carcinoma, metastatic adenopathy or lymphoproliferative disorder. In light of the patient's new brain metastasis primary bronchogenic carcinoma is favored. Suggest further workup with PET-CT and tissue sampling. Lymph nodes should be easily amendable to biopsy via bronchoscopy. 2. Aortic atherosclerosis. Electronically Signed   By: Kerby Moors M.D.   On: 04/28/2016 16:27   Nm Pet Image Initial (pi) Whole Body  Result Date: 05/05/2016 CLINICAL DATA:  Initial treatment strategy for multiple myeloma. Mediastinal lymphadenopathy. EXAM: NUCLEAR MEDICINE PET WHOLE BODY TECHNIQUE: 11.2 mCi F-18 FDG was injected intravenously. Full-ring PET imaging was performed from the vertex to the feet after the radiotracer. CT data was obtained and used for attenuation correction and anatomic localization. FASTING BLOOD GLUCOSE:  Value: 103 mg/dl COMPARISON:  04/28/2016 FINDINGS: NECK Hypermetabolic LEFT and RIGHT supraclavicular lymph nodes. Larger lymph nodes on the RIGHT measure up to 13 mm short axis. Lymph nodes are intensely hypermetabolic SUV max equal 9.7. Hypermetabolic paratracheal lymph nodes. For example 13 mm short axis RIGHT lower paratracheal lymph node with SUV max equal 16.6. There are hypermetabolic RIGHT and LEFT lower paratracheal lymph node and subcarinal lymph nodes. Bilateral hypermetabolic hilar lymph nodes with SUV max equal 13.1. No suspicious pulmonary nodularity. There is no subpleural nodularity. No central para bronchovascular thickening. CHEST No hypermetabolic mediastinal or hilar nodes. No suspicious pulmonary nodules on the CT scan. ABDOMEN/PELVIS No abnormal metabolic activity the liver or spleen. Spleen is normal volume. Normal adrenal glands. Kidneys are normal. SKELETON Hypermetabolic lesion in the L4 vertebral body within the LEFT pedicle with SUV max equal 12.1. There is sclerosis in the pedicles and a linear  linear lucency additionally. No additional abnormal uptake within the bones. Extremities: No focal scattered uptake within the LEFT knee associated with the prosthetic. This is likely inflammatory metabolic activity. IMPRESSION: 1. Symmetric mediastinal, hilar and supraclavicular adenopathy which is intensely metabolic for size. In patient brain metastasis lesions are concerning for nodal metastasis. Supraclavicular lymph nodes may be assessable for biopsy. 2. No focal pulmonary lesion identified. 3. Single focus of intense linear metabolic activity within the LEFT pedicle/facet of the L4 vertebral body. There is a linear lucency at this level additionally. Favor remote fracture of this vertebral body rather than myeloma lesion or metastatic lesion. No additional evidence of myeloma or plasmacytoma. 4. Metabolic activity associated with the knee prosthetics, greater on the LEFT is favored inflammatory. Electronically Signed   By: Nicole Kindred  Leonia Reeves M.D.   On: 05/05/2016 18:27    ASSESSMENT AND PLAN: This is a very pleasant 75 years old white male with   1) history of multiple myeloma status post induction chemotherapy with Velcade, Revlimid and dexamethasone followed by treatment with Carfilzomib, cyclophosphamide and dexamethasone with significant improvement in his disease followed by autologous peripheral blood stem cell transplant at [facility] on 03/20/2013. This was followed by 2 cycles of consolidation chemotherapy with Carfilzomib, Cytoxan and dexamethasone, and he tolerated it fairly well. He completed maintenance Revlimid 10 mg by mouth daily for 3 months and tolerating it fairly well. He started maintenance Revlimid with 15 mg by mouth daily in May 2015 and tolerating it well except for the persistent neutropenia.  He started the reduced dose of Revlimid 10 mg by mouth daily on 02/19/2015. He is tolerating this treatment much better.  The recent myeloma panel showed no evidence for disease  progression. I discussed the lab result with the patient today. I recommended for the patient to discontinue his current treatment with Revlimid for now because of the concern about Revlimid induced second malignancy. 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 These findings could be a new malignancy probably lung cancer or other primary. It is always a possibility of secondary malignancy secondary to prolonged treatment with Revlimid. I advised him to discontinue Revlimid at this point. The PET scan showed hypermetabolic lymphadenopathy in the right supraclavicular as well as mediastinum. No other evidence of metastatic disease outside the chest except for the multiple brain metastases He is scheduled for tissue diagnosis on 05/11/2016 For the metastatic brain metastasis, he is expected to undergo stereotactic radiotherapy under the care of Dr. Lisbeth Renshaw. I recommended for the patient to decrease the dose of dexamethasone to 4 mg by mouth twice a day because of intolerance. I also strongly advised the patient not to drive for now until treatment of his metastatic brain lesions. He will continue treatment with Zometa every 3 months. It is currently on hold for now. For the pulmonary embolism, he will continue on Xarelto 20 mg by mouth daily as scheduled but take an established 2-3 days before the biopsy and resume it again after the biopsy.  For insomnia he will continue on Restoril. For the peripheral neuropathy, I started the patient on Neurontin 100 mg by mouth 3 times a day. For the back pain, he was given a refill of Percocet. I will see the patient back for follow-up visit in one week after the tissue diagnosis becomes available. He was advised to call immediately if he has any concerning symptoms in the interval. The patient voices understanding of current disease status and treatment options and is  in agreement with the current care plan.  All questions were answered. The patient knows to call the clinic with any problems, questions or concerns. We can certainly see the patient much sooner if necessary.  Disclaimer: This note was dictated with voice recognition software. Similar sounding words can inadvertently be transcribed and may not be corrected upon review.

## 2016-05-10 ENCOUNTER — Telehealth: Payer: Self-pay | Admitting: Medical Oncology

## 2016-05-10 LAB — LUTEINIZING HORMONE: LH: 11.9 m[IU]/mL — AB (ref 1.7–8.6)

## 2016-05-10 LAB — ACTH: ACTH: 5.2 pg/mL — ABNORMAL LOW (ref 7.2–63.3)

## 2016-05-10 LAB — INSULIN-LIKE GROWTH FACTOR: INSULIN LIKE GF 1: 162 ng/mL (ref 41–179)

## 2016-05-10 LAB — TESTOSTERONE: Testosterone, Serum: 108 ng/dL — ABNORMAL LOW (ref 264–916)

## 2016-05-10 LAB — FOLLICLE STIMULATING HORMONE: FSH: 22.6 m[IU]/mL — ABNORMAL HIGH (ref 1.5–12.4)

## 2016-05-10 NOTE — Telephone Encounter (Signed)
I called [name] and told them that dr [name] discontinued revlimid.

## 2016-05-10 NOTE — Anesthesia Preprocedure Evaluation (Addendum)
Anesthesia Evaluation  Patient identified by MRN, date of birth, ID band Patient awake    Reviewed: Allergy & Precautions, H&P , NPO status , Patient's Chart, lab work & pertinent test results  Airway Mallampati: II  TM Distance: >3 FB Neck ROM: Full    Dental no notable dental hx. (+) Teeth Intact, Dental Advisory Given   Pulmonary neg pulmonary ROS, former smoker, PE   Pulmonary exam normal breath sounds clear to auscultation       Cardiovascular + Peripheral Vascular Disease   Rhythm:Regular Rate:Normal     Neuro/Psych  Headaches, negative psych ROS   GI/Hepatic Neg liver ROS, hiatal hernia, PUD, GERD  Medicated and Controlled,  Endo/Other  negative endocrine ROS  Renal/GU negative Renal ROS  negative genitourinary   Musculoskeletal  (+) Arthritis , Osteoarthritis,    Abdominal   Peds  Hematology negative hematology ROS (+) anemia , Multiple myeloma   Anesthesia Other Findings   Reproductive/Obstetrics negative OB ROS                            Anesthesia Physical Anesthesia Plan  ASA: III  Anesthesia Plan: General   Post-op Pain Management:    Induction: Intravenous  Airway Management Planned: Oral ETT  Additional Equipment:   Intra-op Plan:   Post-operative Plan: Extubation in OR  Informed Consent: I have reviewed the patients History and Physical, chart, labs and discussed the procedure including the risks, benefits and alternatives for the proposed anesthesia with the patient or authorized representative who has indicated his/her understanding and acceptance.   Dental advisory given  Plan Discussed with: CRNA  Anesthesia Plan Comments:         Anesthesia Quick Evaluation

## 2016-05-10 NOTE — Telephone Encounter (Signed)
He is going to take his lasix 20 mg daily for fluid retention. Is it ok? Note to [place].

## 2016-05-11 ENCOUNTER — Ambulatory Visit (HOSPITAL_COMMUNITY): Payer: Medicare Other | Admitting: Anesthesiology

## 2016-05-11 ENCOUNTER — Encounter (HOSPITAL_COMMUNITY)
Admission: RE | Disposition: A | Discharge status: Home / Self Care | Payer: Self-pay | Source: Ambulatory Visit | Attending: Cardiothoracic Surgery

## 2016-05-11 ENCOUNTER — Telehealth: Payer: Self-pay | Admitting: *Deleted

## 2016-05-11 ENCOUNTER — Ambulatory Visit (HOSPITAL_COMMUNITY)
Admission: RE | Admit: 2016-05-11 | Discharge: 2016-05-11 | Disposition: A | Discharge status: Home / Self Care | Payer: Medicare Other | Source: Ambulatory Visit | Attending: Cardiothoracic Surgery | Admitting: Cardiothoracic Surgery

## 2016-05-11 DIAGNOSIS — Z8249 Family history of ischemic heart disease and other diseases of the circulatory system: Secondary | ICD-10-CM | POA: Insufficient documentation

## 2016-05-11 DIAGNOSIS — C7931 Secondary malignant neoplasm of brain: Secondary | ICD-10-CM | POA: Diagnosis not present

## 2016-05-11 DIAGNOSIS — I2699 Other pulmonary embolism without acute cor pulmonale: Secondary | ICD-10-CM | POA: Diagnosis not present

## 2016-05-11 DIAGNOSIS — M1990 Unspecified osteoarthritis, unspecified site: Secondary | ICD-10-CM | POA: Insufficient documentation

## 2016-05-11 DIAGNOSIS — Z823 Family history of stroke: Secondary | ICD-10-CM | POA: Insufficient documentation

## 2016-05-11 DIAGNOSIS — Z85820 Personal history of malignant melanoma of skin: Secondary | ICD-10-CM | POA: Insufficient documentation

## 2016-05-11 DIAGNOSIS — K449 Diaphragmatic hernia without obstruction or gangrene: Secondary | ICD-10-CM | POA: Insufficient documentation

## 2016-05-11 DIAGNOSIS — D630 Anemia in neoplastic disease: Secondary | ICD-10-CM | POA: Insufficient documentation

## 2016-05-11 DIAGNOSIS — K219 Gastro-esophageal reflux disease without esophagitis: Secondary | ICD-10-CM | POA: Insufficient documentation

## 2016-05-11 DIAGNOSIS — Z86711 Personal history of pulmonary embolism: Secondary | ICD-10-CM | POA: Diagnosis not present

## 2016-05-11 DIAGNOSIS — Z7901 Long term (current) use of anticoagulants: Secondary | ICD-10-CM | POA: Diagnosis not present

## 2016-05-11 DIAGNOSIS — Z87891 Personal history of nicotine dependence: Secondary | ICD-10-CM | POA: Insufficient documentation

## 2016-05-11 DIAGNOSIS — C770 Secondary and unspecified malignant neoplasm of lymph nodes of head, face and neck: Secondary | ICD-10-CM | POA: Insufficient documentation

## 2016-05-11 DIAGNOSIS — Z833 Family history of diabetes mellitus: Secondary | ICD-10-CM | POA: Insufficient documentation

## 2016-05-11 DIAGNOSIS — M4186 Other forms of scoliosis, lumbar region: Secondary | ICD-10-CM | POA: Diagnosis not present

## 2016-05-11 DIAGNOSIS — Z9484 Stem cells transplant status: Secondary | ICD-10-CM | POA: Diagnosis not present

## 2016-05-11 DIAGNOSIS — Z885 Allergy status to narcotic agent status: Secondary | ICD-10-CM | POA: Diagnosis not present

## 2016-05-11 DIAGNOSIS — K279 Peptic ulcer, site unspecified, unspecified as acute or chronic, without hemorrhage or perforation: Secondary | ICD-10-CM | POA: Insufficient documentation

## 2016-05-11 DIAGNOSIS — Z96653 Presence of artificial knee joint, bilateral: Secondary | ICD-10-CM | POA: Insufficient documentation

## 2016-05-11 DIAGNOSIS — I700 Atherosclerosis of aorta: Secondary | ICD-10-CM | POA: Insufficient documentation

## 2016-05-11 DIAGNOSIS — C801 Malignant (primary) neoplasm, unspecified: Secondary | ICD-10-CM | POA: Diagnosis not present

## 2016-05-11 DIAGNOSIS — Z9221 Personal history of antineoplastic chemotherapy: Secondary | ICD-10-CM | POA: Insufficient documentation

## 2016-05-11 DIAGNOSIS — R1011 Right upper quadrant pain: Secondary | ICD-10-CM | POA: Diagnosis not present

## 2016-05-11 DIAGNOSIS — I739 Peripheral vascular disease, unspecified: Secondary | ICD-10-CM | POA: Diagnosis not present

## 2016-05-11 DIAGNOSIS — Z923 Personal history of irradiation: Secondary | ICD-10-CM | POA: Diagnosis not present

## 2016-05-11 DIAGNOSIS — Z86718 Personal history of other venous thrombosis and embolism: Secondary | ICD-10-CM | POA: Diagnosis not present

## 2016-05-11 DIAGNOSIS — M5136 Other intervertebral disc degeneration, lumbar region: Secondary | ICD-10-CM | POA: Insufficient documentation

## 2016-05-11 DIAGNOSIS — Z880 Allergy status to penicillin: Secondary | ICD-10-CM | POA: Insufficient documentation

## 2016-05-11 DIAGNOSIS — R590 Localized enlarged lymph nodes: Secondary | ICD-10-CM | POA: Diagnosis not present

## 2016-05-11 HISTORY — PX: SCALENE NODE BIOPSY: SHX5446

## 2016-05-11 SURGERY — BIOPSY, LYMPH NODE, SCALENE
Anesthesia: General | Laterality: Right

## 2016-05-11 MED ORDER — PROPOFOL 10 MG/ML IV BOLUS
INTRAVENOUS | Status: AC
  Filled 2016-05-11: qty 20

## 2016-05-11 MED ORDER — PROPOFOL 500 MG/50ML IV EMUL
INTRAVENOUS | Status: DC | PRN
  Administered 2016-05-11: 75 ug/kg/min via INTRAVENOUS

## 2016-05-11 MED ORDER — FENTANYL CITRATE (PF) 100 MCG/2ML IJ SOLN
INTRAMUSCULAR | Status: DC | PRN
  Administered 2016-05-11: 75 ug via INTRAVENOUS
  Administered 2016-05-11 (×2): 25 ug via INTRAVENOUS
  Administered 2016-05-11: 50 ug via INTRAVENOUS
  Administered 2016-05-11: 25 ug via INTRAVENOUS

## 2016-05-11 MED ORDER — LACTATED RINGERS IV SOLN
INTRAVENOUS | Status: DC | PRN
  Administered 2016-05-11 (×2): via INTRAVENOUS

## 2016-05-11 MED ORDER — EPHEDRINE SULFATE 50 MG/ML IJ SOLN
INTRAMUSCULAR | Status: DC | PRN
  Administered 2016-05-11: 5 mg via INTRAVENOUS

## 2016-05-11 MED ORDER — FENTANYL CITRATE (PF) 250 MCG/5ML IJ SOLN
INTRAMUSCULAR | Status: AC
  Filled 2016-05-11: qty 5

## 2016-05-11 MED ORDER — PROPOFOL 1000 MG/100ML IV EMUL
INTRAVENOUS | Status: AC
  Filled 2016-05-11: qty 100

## 2016-05-11 MED ORDER — LIDOCAINE HCL (PF) 1 % IJ SOLN
INTRAMUSCULAR | Status: DC | PRN
  Administered 2016-05-11: 2 mL

## 2016-05-11 MED ORDER — MIDAZOLAM HCL 5 MG/5ML IJ SOLN
INTRAMUSCULAR | Status: DC | PRN
  Administered 2016-05-11: 2 mg via INTRAVENOUS

## 2016-05-11 MED ORDER — HYDROMORPHONE HCL 1 MG/ML IJ SOLN
INTRAMUSCULAR | Status: AC
  Filled 2016-05-11: qty 0.5

## 2016-05-11 MED ORDER — HYDROMORPHONE HCL 1 MG/ML IJ SOLN
0.2500 mg | INTRAMUSCULAR | Status: DC | PRN
  Administered 2016-05-11: 0.5 mg via INTRAVENOUS

## 2016-05-11 MED ORDER — PHENYLEPHRINE HCL 10 MG/ML IJ SOLN
INTRAMUSCULAR | Status: DC | PRN
  Administered 2016-05-11 (×3): 80 ug via INTRAVENOUS

## 2016-05-11 MED ORDER — LIDOCAINE HCL (PF) 1 % IJ SOLN
INTRAMUSCULAR | Status: AC
  Filled 2016-05-11: qty 30

## 2016-05-11 MED ORDER — SUCCINYLCHOLINE CHLORIDE 200 MG/10ML IV SOSY
PREFILLED_SYRINGE | INTRAVENOUS | Status: AC
  Filled 2016-05-11: qty 10

## 2016-05-11 MED ORDER — 0.9 % SODIUM CHLORIDE (POUR BTL) OPTIME
TOPICAL | Status: DC | PRN
  Administered 2016-05-11: 1000 mL

## 2016-05-11 MED ORDER — SUGAMMADEX SODIUM 200 MG/2ML IV SOLN
INTRAVENOUS | Status: AC
  Filled 2016-05-11: qty 2

## 2016-05-11 MED ORDER — MIDAZOLAM HCL 2 MG/2ML IJ SOLN
INTRAMUSCULAR | Status: AC
  Filled 2016-05-11: qty 2

## 2016-05-11 MED ORDER — CEFAZOLIN SODIUM 1 G IJ SOLR
INTRAMUSCULAR | Status: AC
  Filled 2016-05-11: qty 20

## 2016-05-11 MED ORDER — LIDOCAINE HCL (CARDIAC) 20 MG/ML IV SOLN
INTRAVENOUS | Status: DC | PRN
  Administered 2016-05-11: 8 mg via INTRAVENOUS

## 2016-05-11 MED ORDER — ONDANSETRON HCL 4 MG/2ML IJ SOLN
INTRAMUSCULAR | Status: DC | PRN
  Administered 2016-05-11: 4 mg via INTRAVENOUS

## 2016-05-11 MED ORDER — ROCURONIUM BROMIDE 10 MG/ML (PF) SYRINGE
PREFILLED_SYRINGE | INTRAVENOUS | Status: AC
  Filled 2016-05-11: qty 10

## 2016-05-11 MED ORDER — CEFAZOLIN SODIUM-DEXTROSE 2-3 GM-% IV SOLR
INTRAVENOUS | Status: DC | PRN
  Administered 2016-05-11: 2 g via INTRAVENOUS

## 2016-05-11 MED ORDER — EPINEPHRINE PF 1 MG/ML IJ SOLN
INTRAMUSCULAR | Status: AC
  Filled 2016-05-11: qty 1

## 2016-05-11 MED ORDER — ONDANSETRON HCL 4 MG/2ML IJ SOLN
INTRAMUSCULAR | Status: AC
Start: 2016-05-11 — End: 2016-05-11
  Filled 2016-05-11: qty 2

## 2016-05-11 MED ORDER — LIDOCAINE 2% (20 MG/ML) 5 ML SYRINGE
INTRAMUSCULAR | Status: AC
Start: 2016-05-11 — End: 2016-05-11
  Filled 2016-05-11: qty 10

## 2016-05-11 SURGICAL SUPPLY — 39 items
ADH SKN CLS APL DERMABOND .7 (GAUZE/BANDAGES/DRESSINGS) ×2
BRUSH CYTOL CELLEBRITY 1.5X140 (MISCELLANEOUS) IMPLANT
CANISTER SUCTION 2500CC (MISCELLANEOUS) ×3 IMPLANT
CLIP TI MEDIUM 6 (CLIP) ×2 IMPLANT
CLIP TI WIDE RED SMALL 6 (CLIP) ×2 IMPLANT
CONT SPEC 4OZ CLIKSEAL STRL BL (MISCELLANEOUS) ×7 IMPLANT
COVER DOME SNAP 22 D (MISCELLANEOUS) ×1 IMPLANT
COVER SURGICAL LIGHT HANDLE (MISCELLANEOUS) ×2 IMPLANT
COVER TABLE BACK 60X90 (DRAPES) ×1 IMPLANT
DERMABOND ADVANCED (GAUZE/BANDAGES/DRESSINGS) ×1
DERMABOND ADVANCED .7 DNX12 (GAUZE/BANDAGES/DRESSINGS) ×1 IMPLANT
ELECT CAUTERY BLADE 6.4 (BLADE) ×2 IMPLANT
FORCEPS BIOP RJ4 1.8 (CUTTING FORCEPS) IMPLANT
GAUZE SPONGE 4X4 12PLY STRL (GAUZE/BANDAGES/DRESSINGS) ×3 IMPLANT
GLOVE BIO SURGEON STRL SZ 6.5 (GLOVE) ×3 IMPLANT
KIT CLEAN ENDO COMPLIANCE (KITS) ×3 IMPLANT
KIT ROOM TURNOVER OR (KITS) ×3 IMPLANT
MARKER SKIN DUAL TIP RULER LAB (MISCELLANEOUS) ×1 IMPLANT
NDL BIOPSY TRANSBRONCH 21G (NEEDLE) IMPLANT
NDL BLUNT 18X1 FOR OR ONLY (NEEDLE) IMPLANT
NDL EBUS SONO TIP PENTAX (NEEDLE) ×1 IMPLANT
NEEDLE 22X1 1/2 (OR ONLY) (NEEDLE) ×2 IMPLANT
NEEDLE BIOPSY TRANSBRONCH 21G (NEEDLE) IMPLANT
NEEDLE BLUNT 18X1 FOR OR ONLY (NEEDLE) IMPLANT
NEEDLE EBUS SONO TIP PENTAX (NEEDLE) ×3 IMPLANT
NS IRRIG 1000ML POUR BTL (IV SOLUTION) ×1 IMPLANT
OIL SILICONE PENTAX (PARTS (SERVICE/REPAIRS)) ×1 IMPLANT
PAD ARMBOARD 7.5X6 YLW CONV (MISCELLANEOUS) ×6 IMPLANT
SUT VIC AB 3-0 SH 8-18 (SUTURE) ×2 IMPLANT
SUT VIC AB 3-0 X1 27 (SUTURE) ×2 IMPLANT
SUT VICRYL 4-0 PS2 18IN ABS (SUTURE) ×2 IMPLANT
SYR 20CC LL (SYRINGE) ×1 IMPLANT
SYR 20ML ECCENTRIC (SYRINGE) ×1 IMPLANT
SYR CONTROL 10ML LL (SYRINGE) ×2 IMPLANT
TOWEL OR 17X24 6PK STRL BLUE (TOWEL DISPOSABLE) ×3 IMPLANT
TOWEL OR 17X26 10 PK STRL BLUE (TOWEL DISPOSABLE) IMPLANT
TRAP SPECIMEN MUCOUS 40CC (MISCELLANEOUS) ×1 IMPLANT
TUBE CONNECTING 20X1/4 (TUBING) ×1 IMPLANT
WATER STERILE IRR 1000ML POUR (IV SOLUTION) ×1 IMPLANT

## 2016-05-11 NOTE — Telephone Encounter (Signed)
Oncology Nurse Navigator Documentation  Oncology Nurse Navigator Flowsheets 05/11/2016  Navigator Location [facility]  Navigator Encounter Type Telephone  Telephone Outgoing Call/Mr. Mcwhirter called and left me a vm message to call.  I called him back.  He states the biopsy is lung cancer and needs a follow up with Dr. [name].  Dr. [name] is out of the office today.  I will update him to ask about follow up appt.  I update patient   Treatment Phase Pre-Tx/Tx Discussion  Barriers/Navigation Needs Coordination of Care  Interventions Coordination of Care  Coordination of Care Appts  Acuity Level 2  Acuity Level 2 Assistance expediting appointments  Time Spent with Patient 15

## 2016-05-11 NOTE — Addendum Note (Signed)
Addendum  created 05/11/16 1401 by Izora Gala, CRNA   Anesthesia Event edited

## 2016-05-11 NOTE — Anesthesia Procedure Notes (Signed)
Procedure Name: MAC Date/Time: 05/11/2016 8:36 AM Performed by: Izora Gala Pre-anesthesia Checklist: Patient identified, Emergency Drugs available, Suction available and Patient being monitored Patient Re-evaluated:Patient Re-evaluated prior to inductionOxygen Delivery Method: Circle system utilized Preoxygenation: Pre-oxygenation with 100% oxygen Intubation Type: IV induction Placement Confirmation: CO2 detector

## 2016-05-11 NOTE — H&P (Signed)
[address]       [name], [id number]             [phone]                    [name] [facility] Medical Record #[id number] Date of Birth: 16-Dec-1940  Referring: No ref. provider found Primary Care: [name],[name], MD  Chief Complaint:    No chief complaint on file.   History of Present Illness:    [name] 75 y.o. male is seen in the office  today for questionable metastatic carcinoma with multiple brain metastasis as well as mediastinal lymphadenopathy. Patient has history of Stage IIA multiple myeloma diagnosed in July of 2013. The patient had noted increasing headaches with some slight blurred vision but no obvious field cuts. Because of his increasing headaches a CT scan and MRI of the brain were done by Dr. Inda Merlin demonstrating evidence of brain metastasis, CT of the chest suggested possible lung primary. The patient has a very distant history of smoking but quit more than 30 years ago. He denies any hemoptysis      Multiple myeloma ([place])   Staging form: Multiple Myeloma, AJCC 6th Edition   - Clinical: Stage IIA - Signed by Curt Bears, MD on 02/02/2012   CURRENT THERAPY:  1) Maintenance therapy with Revlimid 10 mg by mouth daily started 02/19/2015. currently on hold starting today.  2) Zometa 4 mg IV every 3 months. 3) Xarelto 20 mg by mouth daily for pulmonary embolism.Current Activity/ Functional Status: PRIOR THERAPY:  1) Systemic chemotherapy with subcutaneous Velcade 1.3 mg/M2 on days 1, 4, 8 and 11 every 3 weeks in addition to Revlimid 25 mg by mouth daily for 14 days every 3 weeks and dexamethasone 40 mg on a weekly basis. Status post 4 planned cycles.  2) Systemic chemotherapy with subcutaneous Velcade 1.3 mg/M2 on days 1, 4, 8 and 11 every 3 weeks in addition to Revlimid 25 mg by mouth daily for 14 days every 3 weeks and dexamethasone 40 mg on a weekly basis. 4 more cycles are planned and he is status post 4 cycles of the  second set of 4 cycles, now status post a total of 10 cycles. 3) Systemic chemotherapy with Carfilzomib 36 mg/M2 on days 1, 2, 8, 9, 15 and 16 in addition to cyclophosphamide 300 mg/M2 IV on days 1, 8 and 15 as well as dexamethasone on days 1, 8, 15 and 22 every 4 weeks. Status post 4 cycles. 4) Zometa 4 mg IV every month. 5) autologous peripheral blood stem cell transplant on 03/20/2013 at [facility]. 6) Consolidation chemotherapy with Carfilzomib 36 mg/M2 on days 1, 2, 8, 9, 15 and 16 every 4 weeks in addition to cyclophosphamide 300 mg/M2 on days 1, 8 and 15 as well as dexamethasone 40 mg on a weekly basis every 4 weeks. Status post 2 cycles. First cycle 06/11/2013. 7) Maintenance therapy with Revlimid 10 mg by mouth daily status post 3 months of treatment. 8) palliative radiotherapy to the lytic lesion in the skull under the care of Dr. Sondra Come 9) Maintenance therapy with Revlimid 15 mg by mouth daily started 11/09/2013 and discontinued 02/18/2015 secondary to persistent neutropenia.    Patient is independent with mobility/ambulation, transfers, ADL's, IADL's.   Zubrod Score: At the time of surgery this patient's most appropriate activity status/level should be described as: '[]'$     0    Normal  activity, no symptoms '[x]'$     1    Restricted in physical strenuous activity but ambulatory, able to do out light work '[]'$     2    Ambulatory and capable of self care, unable to do work activities, up and about               >50 % of waking hours                              '[]'$     3    Only limited self care, in bed greater than 50% of waking hours '[]'$     4    Completely disabled, no self care, confined to bed or chair '[]'$     5    Moribund   Past Medical History:  Diagnosis Date  . Adenomatous polyp of colon 1996  . Arthritis    Osteoarthritis right knee, spine, wrist  . Arthritis of hand, right    Right thumb (Dr. Daylene Katayama)  . Back ache    r/o Multiple Myeloma  . Benign prostatic hypertrophy      (Dr. Karsten Ro in past)  . Degenerative disc disease   . Diverticulosis   . DVT (deep venous thrombosis) ([place]) 01/2014   R calf  . Encounter for antineoplastic chemotherapy 10/20/2015  . Erosive esophagitis   . GERD (gastroesophageal reflux disease)   . Hemorrhoids   . Hiatal hernia   . History of chronic prostatitis   . History of melanoma    Back (Dr. Wilhemina Bonito); early melanoma R arm 03/2011  . Multiple myeloma ([place]) 2013   skull lytic lesions--treated with radiation 07/2014  . Pulmonary embolism ([place]) 01/2014  . Radiation 07/21/14-08/05/14   left occipital parietal skull 25 gray  . SCC (squamous cell carcinoma) 08/04/15   right ear;dysplastic nevus with severe atypia-chest    Past Surgical History:  Procedure Laterality Date  . APPENDECTOMY    . BONE MARROW TRANSPLANT  03/2013   Stem Cell  . COLONOSCOPY  2009   adenomatous polyp  . ESOPHAGOGASTRODUODENOSCOPY  2009   mild inflammation at esophagogastric junction  . excision of melanoma     back ( x3)  . Great toe surgery     bilateral  . INGUINAL HERNIA REPAIR     left, x 2  . port placemet  10/2012  . TOTAL KNEE ARTHROPLASTY  06/08/2011   Procedure: TOTAL KNEE ARTHROPLASTY;  Surgeon: Ninetta Lights, MD;  Location: [place];  Service: Orthopedics;  Laterality: Right;  osteonics  . total knee replaced  2000   Left knee    Family History  Problem Relation Age of Onset  . Dementia Mother   . Stroke Father     brainstem stroke  . Hypertension Father   . Diabetes Father   . Prostate cancer Maternal Grandfather   . Depression Daughter   . Anesthesia problems Neg Hx     Social History   Social History  . Marital status: Married    Spouse name: N/A  . Number of children: 2  . Years of education: N/A   Occupational History  . 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 Smoking status: Former Smoker    Packs/day: 3.00    Years: 10.00    Types: Cigarettes    Quit  date: 07/11/1976  . Smokeless tobacco: Never Used     Comment: 3  PPD x 6 years  . Alcohol use 1.5 oz/week    3 Standard drinks or equivalent per week     Comment: 2 glasses of wine daily  . Drug use: No  . Sexual activity: Yes    Partners: Female   Other Topics Concern  . Not on file   Social History Narrative   Married.  2 daughters, both in [place], 4 granddaughters    History  Smoking Status  . Former Smoker  . Packs/day: 3.00  . Years: 10.00  . Types: Cigarettes  . Quit date: 07/11/1976  Smokeless Tobacco  . Never Used    Comment: 3 PPD x 6 years    History  Alcohol Use  . 1.5 oz/week  . 3 Standard drinks or equivalent per week    Comment: 2 glasses of wine daily     Allergies  Allergen Reactions  . Tramadol Palpitations    "Made my chest feel tight"  . Amoxicillin     REACTION: rash  . Penicillins Rash    Has patient had a PCN reaction causing immediate rash, facial/tongue/throat swelling, SOB or lightheadedness with hypotension: unknown Has patient had a PCN reaction causing severe rash involving mucus membranes or skin necrosis: unknown Has patient had a PCN reaction that required hospitalization: no  Has patient had a PCN reaction occurring within the last 10 years: no  If all of the above answers are "NO", then may proceed with Cephalosporin use.     No current facility-administered medications for this encounter.    Facility-Administered Medications Ordered in Other Encounters  Medication Dose Route Frequency Provider Last Rate Last Dose  . ondansetron (ZOFRAN) tablet 8 mg  8 mg Oral Once Curt Bears, MD          Review of Systems:     Cardiac Review of Systems: Y or N  Chest Pain [ n   ]  Resting SOB [ n  ] Exertional SOB  [n  ]  Vertell Limber Florencio.Farrier  ]   Pedal Edema [ n  ]    Palpitations [ n ] Syncope  [  ]   Presyncope [  n ]  General Review of Systems: [Y] = yes [  ]=non Constitional: recent weight change [  ];  Wt loss over the last 3 months [   ]  anorexia [  ]; fatigue [  ]; nausea [  ]; night sweats [  ]; fever [  ]; or chills [  ];          Dental: poor dentition[  ]; Last Dentist visit:   Eye : blurred vision [  ]; diplopia [   ]; vision changes [  ];  Amaurosis fugax[  ]; Resp: cough [  ];  wheezing[  ];  hemoptysis[  ]; shortness of breath[  ]; paroxysmal nocturnal dyspnea[  ]; dyspnea on exertion[  ]; or orthopnea[  ];  GI:  gallstones[  ], vomiting[  ];  dysphagia[  ]; melena[  ];  hematochezia [  ]; heartburn[  ];   Hx of  Colonoscopy[  ]; GU: kidney stones [  ]; hematuria[  ];   dysuria [  ];  nocturia[  ];  history of     obstruction [  ]; urinary frequency [  ]             Skin: rash, swelling[  ];, hair loss[  ];  peripheral edema[  ];  or itching[  ];  Musculosketetal: myalgias[  ];  joint swelling[  ];  joint erythema[  ];  joint pain[  ];  back pain[  ];  Heme/Lymph: bruising[  ];  bleeding[  ];  anemia[  ];  Neuro: TIA[  ];  headaches[y  ];  stroke[  ];  vertigo[  ];  seizures[n  ];   paresthesias[ n ];  difficulty walking[  ];  Psych:depression[  ]; anxiety[  ];  Endocrine: diabetes[  ];  thyroid dysfunction[  ];  Immunizations: Flu up to date [  ]; Pneumococcal up to date [  ];  Other:  Physical Exam: BP (!) 130/94   Pulse 69   Temp 98.3 F (36.8 C)   Resp 18   SpO2 95%   PHYSICAL EXAMINATION: General appearance: alert, cooperative and no distress Head: Normocephalic, without obvious abnormality, atraumatic Neck: no adenopathy, no carotid bruit, no JVD, supple, symmetrical, trachea midline and thyroid not enlarged, symmetric, no tenderness/mass/nodules Lymph nodes: Cervical, supraclavicular, and axillary nodes normal. Resp: clear to auscultation bilaterally Back: symmetric, no curvature. ROM normal. No CVA tenderness. Cardio: regular rate and rhythm, S1, S2 normal, no murmur, click, rub or gallop GI: soft, non-tender; bowel sounds normal; no masses,  no organomegaly Extremities: extremities normal, atraumatic,  no cyanosis or edema Neurologic: Grossly normal  Diagnostic Studies & Laboratory data:     Recent Radiology Findings:   Ct Head W Wo Contrast  Result Date: 04/27/2016 CLINICAL DATA:  Daily headaches. History of lytic skull lesion and posterior calvarial radiation 2016. History of stem cell transplant in 2014. EXAM: CT HEAD WITHOUT AND WITH CONTRAST TECHNIQUE: Contiguous axial images were obtained from the base of the skull through the vertex without and with intravenous contrast CONTRAST:  22mL ISOVUE-300 IOPAMIDOL intravenous COMPARISON:  04/16/2015 FINDINGS: Brain: Ovoid area of ring enhancement superior right cerebellum measuring up to 16 mm. There is a 6 mm focus of nodular enhancement in the parasagittal right occipital lobe. No evidence of acute infarct, hemorrhage, or hydrocephalus. Vascular: Major vessels are patent. Skull: Stable.  No progressed lucency.  No table erosion. Sinuses/Orbits: Bilateral cataract resection. Other: These results will be called to the ordering clinician or representative by the Radiologist Assistant, and communication documented in the PACS or zVision Dashboard. IMPRESSION: 1. Enhancing lesions in the right superior cerebellum and right occipital lobe, measuring up to 16 mm in the cerebellum. Metastasis (especially from patient's history of melanoma), subacute infarct, or radiation necrosis (history of posterior skull radiotherapy) are differential considerations. If no contraindication to MRI, recommend enhanced MRI follow-up. If subacute infarct is a clinical possibility, delaying the scan for 6 weeks would allow infarct related enhancement to subside. 2. Stable calvarium. Electronically Signed   By: Marnee Spring M.D.   On: 04/27/2016 10:27   Ct Chest W Contrast  Result Date: 04/28/2016 CLINICAL DATA:  History of multiple myeloma. Newly diagnosed brain metastases. EXAM: CT CHEST, ABDOMEN, AND PELVIS WITH CONTRAST TECHNIQUE: Multidetector CT imaging of the chest,  abdomen and pelvis was performed following the standard protocol during bolus administration of intravenous contrast. CONTRAST:  80mL ISOVUE-300 IOPAMIDOL (ISOVUE-300) INJECTION 61%, ISOVUE-300 IOPAMIDOL (ISOVUE-300) INJECTION 61% COMPARISON:  02/07/2014 and abdomen and pelvis from 04/28/2014. FINDINGS: CT CHEST FINDINGS Cardiovascular: The heart size appears normal. There is no pericardial effusion. Mediastinum/Nodes: The trachea appears patent and is midline. Normal appearance of the esophagus. Right paratracheal lymph node measures 1.2 cm, image number 23 of series 2. There is a right hilar node which measures 1.8 cm,  image 29 of series 2. Previously 0.7 cm. Sub- carinal lymph node measures 1.5 cm, image 30 of series 2. Previously 0.7 cm. Right hilar lymph node measures 1.7 cm, image 35 of series 2. Previously 1.0 cm. Lungs/Pleura: No pleural effusion. Small nodule in the left upper lobe is new from previous exam measuring 5 mm, image number 26 of series 4. Musculoskeletal: Spondylosis is identified within the thoracic spine. The bones appear osteopenic. CT ABDOMEN PELVIS FINDINGS Hepatobiliary: No focal liver abnormality is seen. No gallstones, gallbladder wall thickening, or biliary dilatation.The gallbladder is normal. No biliary dilatation Pancreas: Unremarkable. No pancreatic ductal dilatation or surrounding inflammatory changes. Spleen: Normal in size without focal abnormality. Adrenals/Urinary Tract: The adrenal glands are normal. There is a normal appearance of the right kidney. Scarring involving the upper pole the left kidney noted. The urinary bladder appears normal. Stomach/Bowel: The stomach is normal. The small bowel loops have a normal course and caliber. No pathologic dilatation of the colon. Vascular/Lymphatic: Calcified atherosclerotic disease involves the abdominal aorta. No aneurysm. No enlarged upper abdominal lymph nodes identified. No pelvic or inguinal adenopathy identified.  Reproductive: Prostate is unremarkable. Other: No abdominal wall hernia or abnormality. No abdominopelvic ascites. Musculoskeletal: The bones appear diffusely osteopenic. There is degenerative disc disease and scoliosis within the lumbar spine. IMPRESSION: 1. Interval development of mediastinal and hilar adenopathy. Findings may reflect primary bronchogenic carcinoma, metastatic adenopathy or lymphoproliferative disorder. In light of the patient's new brain metastasis primary bronchogenic carcinoma is favored. Suggest further workup with PET-CT and tissue sampling. Lymph nodes should be easily amendable to biopsy via bronchoscopy. 2. Aortic atherosclerosis. Electronically Signed   By: Kerby Moors M.D.   On: 04/28/2016 16:27   Mr Jeri Cos And Wo Contrast  Result Date: 04/27/2016 CLINICAL DATA:  History of multiple myeloma. Dizziness. Abnormal CT head EXAM: MRI HEAD WITHOUT AND WITH CONTRAST TECHNIQUE: Multiplanar, multiecho pulse sequences of the brain and surrounding structures were obtained without and with intravenous contrast. CONTRAST:  22m MULTIHANCE GADOBENATE DIMEGLUMINE 529 MG/ML IV SOLN COMPARISON:  CT head 04/27/2016, MRI 07/31/2013 FINDINGS: Brain: Multiple enhancing lesions are present in the brain compatible with metastatic disease. The largest lesion in the right superior cerebellum measures 16 x 12 mm with surrounding edema. Right parietal operculum lesion measures 8 x 9 mm. Right occipital lobe lesion measures 5 mm Small bilateral cerebellar enhancing lesions 2 mm each. Abnormal pituitary. Pituitary is enlarged and shows heterogeneous enhancement. There is also thickening and enhancement of the infundibulum diffusely. These findings were not present on the prior MRI 07/31/2013. Currently the pituitary measures 10 mm and height. Cavernous sinus normal. No compression of the optic chiasm. Generalized atrophy. Negative for hydrocephalus. Negative for acute infarct. No significant chronic  ischemic change. No intracranial hemorrhage. Vascular: Normal arterial flow void. Skull and upper cervical spine: Negative Sinuses/Orbits: Mucosal edema in the paranasal sinuses. No orbital mass. Bilateral lens replacement. Other: None IMPRESSION: Multiple enhancing mass lesions in the brain most consistent with metastatic disease. Mild edema in the right cerebellum and right parietal operculum lesion. No shift of the midline structures. Abnormal pituitary which shows heterogeneous enhancement and is mildly enlarged measuring 10 mm. Thickening and enhancement of the infundibulum. Differential includes metastatic disease to the pituitary. Other possibilities include lymphocytic hypophysitis, sarcoid, pituitary adenoma. Electronically Signed   By: CFranchot GalloM.D.   On: 04/27/2016 15:27   Ct Abdomen Pelvis W Contrast  Result Date: 04/28/2016 CLINICAL DATA:  History of multiple myeloma. Newly diagnosed brain metastases.  EXAM: CT CHEST, ABDOMEN, AND PELVIS WITH CONTRAST TECHNIQUE: Multidetector CT imaging of the chest, abdomen and pelvis was performed following the standard protocol during bolus administration of intravenous contrast. CONTRAST:  65mL ISOVUE-300 IOPAMIDOL (ISOVUE-300) INJECTION 61%, ISOVUE-300 IOPAMIDOL (ISOVUE-300) INJECTION 61% COMPARISON:  02/07/2014 and abdomen and pelvis from 04/28/2014. FINDINGS: CT CHEST FINDINGS Cardiovascular: The heart size appears normal. There is no pericardial effusion. Mediastinum/Nodes: The trachea appears patent and is midline. Normal appearance of the esophagus. Right paratracheal lymph node measures 1.2 cm, image number 23 of series 2. There is a right hilar node which measures 1.8 cm, image 29 of series 2. Previously 0.7 cm. Sub- carinal lymph node measures 1.5 cm, image 30 of series 2. Previously 0.7 cm. Right hilar lymph node measures 1.7 cm, image 35 of series 2. Previously 1.0 cm. Lungs/Pleura: No pleural effusion. Small nodule in the left upper lobe  is new from previous exam measuring 5 mm, image number 26 of series 4. Musculoskeletal: Spondylosis is identified within the thoracic spine. The bones appear osteopenic. CT ABDOMEN PELVIS FINDINGS Hepatobiliary: No focal liver abnormality is seen. No gallstones, gallbladder wall thickening, or biliary dilatation.The gallbladder is normal. No biliary dilatation Pancreas: Unremarkable. No pancreatic ductal dilatation or surrounding inflammatory changes. Spleen: Normal in size without focal abnormality. Adrenals/Urinary Tract: The adrenal glands are normal. There is a normal appearance of the right kidney. Scarring involving the upper pole the left kidney noted. The urinary bladder appears normal. Stomach/Bowel: The stomach is normal. The small bowel loops have a normal course and caliber. No pathologic dilatation of the colon. Vascular/Lymphatic: Calcified atherosclerotic disease involves the abdominal aorta. No aneurysm. No enlarged upper abdominal lymph nodes identified. No pelvic or inguinal adenopathy identified. Reproductive: Prostate is unremarkable. Other: No abdominal wall hernia or abnormality. No abdominopelvic ascites. Musculoskeletal: The bones appear diffusely osteopenic. There is degenerative disc disease and scoliosis within the lumbar spine. IMPRESSION: 1. Interval development of mediastinal and hilar adenopathy. Findings may reflect primary bronchogenic carcinoma, metastatic adenopathy or lymphoproliferative disorder. In light of the patient's new brain metastasis primary bronchogenic carcinoma is favored. Suggest further workup with PET-CT and tissue sampling. Lymph nodes should be easily amendable to biopsy via bronchoscopy. 2. Aortic atherosclerosis. Electronically Signed   By: Signa Kell M.D.   On: 04/28/2016 16:27   EXAM: NUCLEAR MEDICINE PET WHOLE BODY  TECHNIQUE: 11.2 mCi F-18 FDG was injected intravenously. Full-ring PET imaging was performed from the vertex to the feet after the  radiotracer. CT data was obtained and used for attenuation correction and anatomic localization.  FASTING BLOOD GLUCOSE:  Value: 103 mg/dl  COMPARISON:  16/04/9603  FINDINGS: NECK  Hypermetabolic LEFT and RIGHT supraclavicular lymph nodes. Larger lymph nodes on the RIGHT measure up to 13 mm short axis. Lymph nodes are intensely hypermetabolic SUV max equal 9.7.  Hypermetabolic paratracheal lymph nodes. For example 13 mm short axis RIGHT lower paratracheal lymph node with SUV max equal 16.6. There are hypermetabolic RIGHT and LEFT lower paratracheal lymph node and subcarinal lymph nodes. Bilateral hypermetabolic hilar lymph nodes with SUV max equal 13.1.  No suspicious pulmonary nodularity. There is no subpleural nodularity. No central para bronchovascular thickening.  CHEST  No hypermetabolic mediastinal or hilar nodes. No suspicious pulmonary nodules on the CT scan.  ABDOMEN/PELVIS  No abnormal metabolic activity the liver or spleen. Spleen is normal volume. Normal adrenal glands. Kidneys are normal.  SKELETON  Hypermetabolic lesion in the L4 vertebral body within the LEFT pedicle with SUV max equal  12.1. There is sclerosis in the pedicles and a linear linear lucency additionally. No additional abnormal uptake within the bones.  Extremities: No focal scattered uptake within the LEFT knee associated with the prosthetic. This is likely inflammatory metabolic activity.  IMPRESSION: 1. Symmetric mediastinal, hilar and supraclavicular adenopathy which is intensely metabolic for size. In patient brain metastasis lesions are concerning for nodal metastasis. Supraclavicular lymph nodes may be assessable for biopsy. 2. No focal pulmonary lesion identified. 3. Single focus of intense linear metabolic activity within the LEFT pedicle/facet of the L4 vertebral body. There is a linear lucency at this level additionally. Favor remote fracture of this  vertebral body rather than myeloma lesion or metastatic lesion. No additional evidence of myeloma or plasmacytoma. 4. Metabolic activity associated with the knee prosthetics, greater on the LEFT is favored inflammatory.   Electronically Signed   By: Suzy Bouchard M.D.   On: 05/05/2016 18:27 I have independently reviewed the above radiologic studies.    Recent Lab Findings: Lab Results  Component Value Date   WBC 6.2 05/09/2016   HGB 15.3 05/09/2016   HCT 46.3 05/09/2016   PLT 174 05/09/2016   GLUCOSE 133 (H) 05/09/2016   CHOL 91 (L) 02/04/2015   TRIG 62 02/04/2015   HDL 60 02/04/2015   LDLCALC 19 02/04/2015   ALT 37 05/09/2016   AST 23 05/09/2016   NA 148 (H) 05/09/2016   K 4.0 05/09/2016   CL 115 (H) 05/09/2016   CREATININE 1.03 05/09/2016   BUN 22 (H) 05/09/2016   CO2 25 05/09/2016   TSH 1.70 02/24/2016   INR 0.92 05/09/2016   HGBA1C 5.2 04/25/2016      Assessment / Plan:   Patient with previous history of multiple myeloma and also early stage melanoma. Now presents with headache and found to have brain metastasis and increasing mediastinal adenopathy consistent with possible primary lung malignancy with brain metastasis. I reviewed the radiographic findings with the patient and recommended that we proceed with  . We'll have him stop his xarelto 3 days or to the biopsy and will resume postop. Per Dr. Lew Dawes note he did not need bridging.      I confirmed with him what Dr. [name] and told him that he should avoid driving, I added that he should not operate a boat in addition.  The goals risks and alternatives of the planned surgical procedure  right scanlene node biopsy possible  bronchoscopy and ebus with biopsy have been discussed with the patient in detail. The risks of the procedure including death, infection, stroke, myocardial infarction, bleeding, blood transfusion have all been discussed specifically.  I have quoted Carolin [place] a 2% of  perioperative mortality and a complication rate as high as 10 %. The patient's questions have been answered.KOLDEN DUPEE is willing  to proceed with the planned procedure.    Grace Isaac MD      [address] [place] [address] Office [phone]   Beeper [phone]  05/11/2016 8:21 AM

## 2016-05-11 NOTE — Brief Op Note (Signed)
.       [address]       [place] [address]             [phone]      05/11/2016  9:55 AM  PATIENT:  [name]  75 y.o. male  PRE-OPERATIVE DIAGNOSIS:  ADENOPATHY  POST-OPERATIVE DIAGNOSIS:  ADENOPATHY/ likely non small cell lung cancer- final path pending   PROCEDURE:  Procedure(s): BIOPSY RIGHT SCALENE NODE (Right)  SURGEON:  Surgeon(s) and Role:    * Grace Isaac, MD - Primary   ANESTHESIA:   general  EBL:  Total I/O In: 1100 [I.V.:1100] Out: 10 [Blood:10]  BLOOD ADMINISTERED:none  DRAINS: none   LOCAL MEDICATIONS USED:  LIDOCAINE  and Amount: 3 ml  SPECIMEN:  Source of Specimen:  right scalene node biopsy   DISPOSITION OF SPECIMEN:  PATHOLOGY  COUNTS:  YES   DICTATION: .Dragon Dictation  PLAN OF CARE: Discharge to home after PACU  PATIENT DISPOSITION:  PACU - hemodynamically stable.   Delay start of Pharmacological VTE agent (>24hrs) due to surgical blood loss or risk of bleeding: yes

## 2016-05-11 NOTE — Discharge Instructions (Signed)
Resume Xarelto  tomorrow   May shower tomorrow   Call if any drainage or redness

## 2016-05-11 NOTE — Anesthesia Postprocedure Evaluation (Signed)
Anesthesia Post Note  Patient: [name]  Procedure(s) Performed: Procedure(s) (LRB): BIOPSY RIGHT SCALENE NODE (Right)  Patient location during evaluation: PACU Anesthesia Type: MAC Level of consciousness: awake and alert Pain management: pain level controlled Vital Signs Assessment: post-procedure vital signs reviewed and stable Respiratory status: spontaneous breathing, nonlabored ventilation and respiratory function stable Cardiovascular status: stable and blood pressure returned to baseline Anesthetic complications: no    Last Vitals:  Vitals:   05/11/16 1017 05/11/16 1026  BP: 134/86   Pulse: 67   Resp: 11   Temp:  36.4 C    Last Pain:  Vitals:   05/11/16 1028  PainSc: 2                  Tashema Tiller,W. EDMOND

## 2016-05-11 NOTE — Transfer of Care (Signed)
Immediate Anesthesia Transfer of Care Note  Patient: [name]  Procedure(s) Performed: Procedure(s): BIOPSY RIGHT SCALENE NODE (Right)  Patient Location: PACU  Anesthesia Type:General  Level of Consciousness: awake, alert  and patient cooperative  Airway & Oxygen Therapy: Patient connected to nasal cannula oxygen  Post-op Assessment: Report given to RN, Post -op Vital signs reviewed and stable and Patient moving all extremities  Post vital signs: Reviewed and stable  Last Vitals:  Vitals:   05/11/16 0644  BP: (!) 130/94  Pulse: 69  Resp: 18  Temp: 36.8 C    Last Pain: There were no vitals filed for this visit.    Patients Stated Pain Goal: 3 (32/44/01 0272)  Complications: No apparent anesthesia complications

## 2016-05-12 ENCOUNTER — Ambulatory Visit
Admission: RE | Admit: 2016-05-12 | Discharge: 2016-05-12 | Disposition: A | Discharge status: Home / Self Care | Payer: Medicare Other | Source: Ambulatory Visit | Attending: Radiation Oncology | Admitting: Radiation Oncology

## 2016-05-12 ENCOUNTER — Encounter (HOSPITAL_COMMUNITY): Payer: Self-pay | Admitting: Cardiothoracic Surgery

## 2016-05-12 DIAGNOSIS — C7931 Secondary malignant neoplasm of brain: Secondary | ICD-10-CM | POA: Diagnosis not present

## 2016-05-12 DIAGNOSIS — C7949 Secondary malignant neoplasm of other parts of nervous system: Principal | ICD-10-CM

## 2016-05-12 DIAGNOSIS — C3490 Malignant neoplasm of unspecified part of unspecified bronchus or lung: Secondary | ICD-10-CM | POA: Diagnosis not present

## 2016-05-12 MED ORDER — GADOBENATE DIMEGLUMINE 529 MG/ML IV SOLN
13.0000 mL | Freq: Once | INTRAVENOUS | Status: AC | PRN
  Administered 2016-05-12: 13 mL via INTRAVENOUS

## 2016-05-12 NOTE — Op Note (Signed)
NAMEABRHAM, MASLOWSKI NO.:  [id number]  MEDICAL RECORD NO.:  [id number]  LOCATION:  [facility]                         FACILITY:  [facility]  PHYSICIAN:  Lanelle Bal, MD    DATE OF BIRTH:  1940-08-28  DATE OF PROCEDURE:  05/11/2016                               OPERATIVE REPORT   PREOPERATIVE DIAGNOSIS:  Mediastinal and right supraclavicular adenopathy, hypermetabolic.  POSTOPERATIVE DIAGNOSIS:  Mediastinal and right supraclavicular adenopathy, hypermetabolic.  Probable non-small cell lung cancer, followup pathology pending.  SURGICAL PROCEDURE:  Right scalene node biopsy.  SURGEON:  Lanelle Bal, M.D.  BRIEF HISTORY:  The patient is a 75 year old male with previous history of multiple myeloma who now presents with increasing cough and headache. He underwent evaluation with imaging suggestive of advanced stage lung cancer.  PET scan revealed mediastinal adenopathy and right scalene adenopathy.  The patient was seen in the office and was recommended proceeding with scalene node biopsy, possible bronchoscopy, and mediastinoscopy if adequate tissue was not obtained from the scalene node.  The patient agreed and signed informed consent.  DESCRIPTION OF PROCEDURE:  The patient placed in the OR table in supine position.  The previously marked right neck was prepped with Betadine and draped in sterile manner.  The patient was under MAC anesthesia. Appropriate time-out was performed.  We then infiltrated 1% lidocaine 2- 3 mL in the right supraclavicular area.  This was near the insertion site of his Port-A-Cath but care was taken to avoid any injury to the Port-A-Cath itself.  The platysma was divided and dissection carried down to a firm area of nodal tissue in the supraclavicular area.  This was biopsied.  Initial frozen section was consistent with non-small cell lung cancer.  Additional tissue was obtained and after review with Dr. Lyndon Code there was sufficient tissue  both for molecular testing and final path.  With the operative field hemostatic, interrupted 3-0 Vicryls were used to re-approximate the platysma and a 4-0 subcuticular stitch in the skin edges.  Dermabond was applied and the patient was transferred to the recovery room for further postoperative observation with plan to discharge home following recovery.     Lanelle Bal, MD     EG/MEDQ  D:  05/11/2016  T:  05/12/2016  Job:  [id number]

## 2016-05-13 ENCOUNTER — Ambulatory Visit
Admission: RE | Admit: 2016-05-13 | Discharge: 2016-05-13 | Disposition: A | Discharge status: Home / Self Care | Payer: Medicare Other | Source: Ambulatory Visit | Attending: Radiation Oncology | Admitting: Radiation Oncology

## 2016-05-13 VITALS — BP 131/87 | HR 79 | Temp 98.7°F | Resp 12 | Wt 208.2 lb

## 2016-05-13 DIAGNOSIS — C3491 Malignant neoplasm of unspecified part of right bronchus or lung: Secondary | ICD-10-CM | POA: Diagnosis not present

## 2016-05-13 DIAGNOSIS — Z85820 Personal history of malignant melanoma of skin: Secondary | ICD-10-CM | POA: Diagnosis not present

## 2016-05-13 DIAGNOSIS — C7931 Secondary malignant neoplasm of brain: Secondary | ICD-10-CM

## 2016-05-13 DIAGNOSIS — Z8042 Family history of malignant neoplasm of prostate: Secondary | ICD-10-CM | POA: Diagnosis not present

## 2016-05-13 DIAGNOSIS — C9000 Multiple myeloma not having achieved remission: Secondary | ICD-10-CM | POA: Diagnosis not present

## 2016-05-13 DIAGNOSIS — Z9889 Other specified postprocedural states: Secondary | ICD-10-CM | POA: Diagnosis not present

## 2016-05-13 DIAGNOSIS — Z8249 Family history of ischemic heart disease and other diseases of the circulatory system: Secondary | ICD-10-CM | POA: Diagnosis not present

## 2016-05-13 DIAGNOSIS — Z510 Encounter for antineoplastic radiation therapy: Secondary | ICD-10-CM | POA: Diagnosis not present

## 2016-05-13 DIAGNOSIS — Z87891 Personal history of nicotine dependence: Secondary | ICD-10-CM | POA: Diagnosis not present

## 2016-05-13 DIAGNOSIS — Z79899 Other long term (current) drug therapy: Secondary | ICD-10-CM | POA: Diagnosis not present

## 2016-05-13 DIAGNOSIS — Z833 Family history of diabetes mellitus: Secondary | ICD-10-CM | POA: Diagnosis not present

## 2016-05-13 DIAGNOSIS — Z823 Family history of stroke: Secondary | ICD-10-CM | POA: Diagnosis not present

## 2016-05-13 DIAGNOSIS — Z85858 Personal history of malignant neoplasm of other endocrine glands: Secondary | ICD-10-CM | POA: Diagnosis not present

## 2016-05-13 MED ORDER — LORAZEPAM 0.5 MG PO TABS
0.5000 mg | ORAL_TABLET | Freq: Once | ORAL | Status: AC
  Administered 2016-05-13: 0.5 mg via SUBLINGUAL
  Filled 2016-05-13: qty 1

## 2016-05-13 MED ORDER — SODIUM CHLORIDE 0.9% FLUSH
10.0000 mL | INTRAVENOUS | Status: DC | PRN
  Administered 2016-05-13: 10 mL via INTRAVENOUS

## 2016-05-13 NOTE — Op Note (Signed)
NAMEDEARIES, MEIKLE NO.:  [id number]  MEDICAL RECORD NO.:  [id number]  LOCATION:  [facility]                         FACILITY:  [facility]  PHYSICIAN:  Lanelle Bal, MD    DATE OF BIRTH:  [birth year]  DATE OF PROCEDURE:  05/11/2016                               OPERATIVE REPORT   PREOPERATIVE DIAGNOSIS:  Mediastinal adenopathy and cervical adenopathy suggestive of metastatic carcinoma of the lung.  POSTOPERATIVE DIAGNOSIS:  Mediastinal adenopathy and cervical adenopathy suggestive of metastatic carcinoma of the lung.  FINAL PATH:  Pending.  PROCEDURE PERFORMED:  Right scalene node biopsy with MAC anesthesia.  BRIEF HISTORY:  The patient is a 75 year old male, who presents with increasing severe headache.  He has had a previous history of multiple myeloma.  An evaluation of headache symptoms was rapidly undertaken with MRI of the brain, suggestive of brain metastasis on CT scan and PET scan of the chest confirmed hypermetabolic mediastinal adenopathy and right supraclavicular adenopathy.  The patient was initially seen in the office before the PET scan was done and we discussed proceeding with bronchoscopy and EBUS with a PET scan suggestive of metabolically active nodes in the low neck.  We decided to proceed with scalene node biopsy on the right and if this did not reveal adequate diagnostic material, proceed with intubation and bronchoscopy with EBUS to biopsy mediastinal nodes.  This was discussed with the patient in detail including the risks and options, and he was willing to proceed to obtain a tissue diagnosis.  DESCRIPTION OF PROCEDURE:  The patient placed in supine position, previously marked right neck was prepped with Betadine and draped in usual sterile manner.  Time-out was called.  The right neck was prepped, 1% was infiltrated in the right lower neck.  The patient's Port-A-Cath had been placed in the right jugular vein.  Care was taken not  to disrupt this and we divided through the platysma and found area that was firm and consistent with abnormal lymphoid tissue.  Portions of this node were excised initial.  Frozen section confirmed probable non-small cell cancer, insufficient tissue for metabolic testing.  The operative field was hemostatic.  We reapproximated the platysmas with interrupted 3-0 Vicryl's and 4-0 subcuticular stitch and skin edges.  Sponge and needle count was reported as correct at completion.  Blood loss was minimal.  The patient tolerated the procedure without complication and with adequate tissue to obtain the diagnosis.  The bronchoscopy and EBUS were not performed.    Lanelle Bal, MD    EG/MEDQ  D:  05/12/2016  T:  05/13/2016  Job:  [id number]

## 2016-05-13 NOTE — Progress Notes (Signed)
Does patient have an allergy to IV contrast dye?: No.   Has patient ever received premedication for IV contrast dye?: No   Does patient take metformin?: No.  If patient does take metformin when was the last dose: N/A  Date of lab work: May 09, 2016 BUN: 22 CR: 1.03  IV site: antecubital right, condition patent and no redness  BP 131/87   Pulse 79   Temp 98.7 F (37.1 C) (Oral)   Resp 12   Wt 208 lb 3.2 oz (94.4 kg)   SpO2 95%   BMI 30.75 kg/m

## 2016-05-13 NOTE — Progress Notes (Signed)
Gave Mr. Aronoff Ativan 0.5 mg sl per Dr. Lisbeth Renshaw.  Will continue to monitor.

## 2016-05-16 ENCOUNTER — Encounter: Payer: Self-pay | Admitting: Medical Oncology

## 2016-05-16 ENCOUNTER — Encounter: Payer: Self-pay | Admitting: *Deleted

## 2016-05-16 ENCOUNTER — Telehealth: Payer: Self-pay | Admitting: *Deleted

## 2016-05-16 DIAGNOSIS — R590 Localized enlarged lymph nodes: Secondary | ICD-10-CM

## 2016-05-16 DIAGNOSIS — C7931 Secondary malignant neoplasm of brain: Secondary | ICD-10-CM

## 2016-05-16 NOTE — Telephone Encounter (Signed)
Oncology Nurse Navigator Documentation  Oncology Nurse Navigator Flowsheets 05/16/2016  Navigator Location [facility]  Navigator Encounter Type Telephone;Other/I spoke with Dr. [name] regarding patient's next steps.  Dr. [name] would like tissue to be sent to [facility] and [facility].  I will double check with pathology dept to see if this has been sent if not, to send.  I called patient to update on next steps and we are waiting for molecular test.  I also updated him on his lab appt on 05/20/16 at 3:00.  He verbalized understanding   Telephone Outgoing Call  Treatment Phase Pre-Tx/Tx Discussion  Barriers/Navigation Needs Coordination of Care;Education  Education Other  Interventions Coordination of Care;Education  Coordination of Care Appts;Other  Education Method Verbal  Acuity Level 2  Acuity Level 2 Other;Assistance expediting appointments;Educational needs  Time Spent with Patient 30

## 2016-05-17 ENCOUNTER — Ambulatory Visit
Admission: RE | Admit: 2016-05-17 | Payer: Medicare Other | Source: Ambulatory Visit | Attending: Radiation Oncology | Admitting: Radiation Oncology

## 2016-05-17 ENCOUNTER — Telehealth: Payer: Self-pay | Admitting: Medical Oncology

## 2016-05-17 ENCOUNTER — Telehealth: Payer: Self-pay | Admitting: *Deleted

## 2016-05-17 ENCOUNTER — Other Ambulatory Visit: Payer: Self-pay | Admitting: Radiation Oncology

## 2016-05-17 DIAGNOSIS — E232 Diabetes insipidus: Secondary | ICD-10-CM | POA: Diagnosis not present

## 2016-05-17 DIAGNOSIS — C7931 Secondary malignant neoplasm of brain: Secondary | ICD-10-CM

## 2016-05-17 DIAGNOSIS — D497 Neoplasm of unspecified behavior of endocrine glands and other parts of nervous system: Secondary | ICD-10-CM | POA: Diagnosis not present

## 2016-05-17 NOTE — Progress Notes (Signed)
I called the patient to let him know that two of his lab tests did not result, and need to be re-drawn. He will come tomorrow to have TSH/T4, and AM Cortisol drawn. These results will be communicated with Dr. Sherwood Gambler.

## 2016-05-17 NOTE — Telephone Encounter (Signed)
CALLED PATIENT TO INFORM THAT HE NEEDS TO BE HERE @ 7:30 AM FOR LAB ON 05-18-16, SPOKE WITH PATIENT AND HE IS AWARE OF THIS APPT.

## 2016-05-17 NOTE — Telephone Encounter (Signed)
Returned call and per [name] I told pt to continue on xarelto due to hx of blood clot . Pt states he is adjusting his decadron dose the patient stated Dr Rita Ohara said he can drive.

## 2016-05-18 ENCOUNTER — Encounter: Payer: Self-pay | Admitting: Nurse Practitioner

## 2016-05-18 ENCOUNTER — Ambulatory Visit
Admission: RE | Admit: 2016-05-18 | Discharge: 2016-05-18 | Disposition: A | Discharge status: Home / Self Care | Payer: Medicare Other | Source: Ambulatory Visit | Attending: Radiation Oncology | Admitting: Radiation Oncology

## 2016-05-18 ENCOUNTER — Encounter: Payer: Self-pay | Admitting: *Deleted

## 2016-05-18 ENCOUNTER — Ambulatory Visit (HOSPITAL_BASED_OUTPATIENT_CLINIC_OR_DEPARTMENT_OTHER): Payer: Medicare Other | Admitting: Nurse Practitioner

## 2016-05-18 ENCOUNTER — Ambulatory Visit: Payer: Medicare Other | Admitting: Radiation Oncology

## 2016-05-18 ENCOUNTER — Telehealth: Payer: Self-pay | Admitting: Medical Oncology

## 2016-05-18 VITALS — BP 111/87 | HR 94 | Temp 97.5°F | Ht 69.0 in | Wt 211.0 lb

## 2016-05-18 DIAGNOSIS — C787 Secondary malignant neoplasm of liver and intrahepatic bile duct: Secondary | ICD-10-CM

## 2016-05-18 DIAGNOSIS — E860 Dehydration: Secondary | ICD-10-CM | POA: Diagnosis not present

## 2016-05-18 DIAGNOSIS — C3490 Malignant neoplasm of unspecified part of unspecified bronchus or lung: Secondary | ICD-10-CM | POA: Diagnosis not present

## 2016-05-18 DIAGNOSIS — C7931 Secondary malignant neoplasm of brain: Secondary | ICD-10-CM

## 2016-05-18 DIAGNOSIS — E237 Disorder of pituitary gland, unspecified: Secondary | ICD-10-CM | POA: Diagnosis not present

## 2016-05-18 DIAGNOSIS — R112 Nausea with vomiting, unspecified: Secondary | ICD-10-CM

## 2016-05-18 DIAGNOSIS — C9000 Multiple myeloma not having achieved remission: Secondary | ICD-10-CM

## 2016-05-18 DIAGNOSIS — R590 Localized enlarged lymph nodes: Secondary | ICD-10-CM

## 2016-05-18 LAB — TSH: TSH: 2.911 m(IU)/L (ref 0.320–4.118)

## 2016-05-18 MED ORDER — OXYCODONE HCL 5 MG PO TABS: ORAL_TABLET | ORAL | 0 refills | Status: DC

## 2016-05-18 MED ORDER — ONDANSETRON HCL 4 MG/2ML IJ SOLN
INTRAMUSCULAR | Status: AC
  Filled 2016-05-18: qty 4

## 2016-05-18 MED ORDER — MORPHINE SULFATE 4 MG/ML IJ SOLN
2.0000 mg | Freq: Once | INTRAMUSCULAR | Status: AC
Start: 2016-05-18 — End: 2016-05-18
  Administered 2016-05-18: 2 mg via INTRAVENOUS
  Filled 2016-05-18: qty 1

## 2016-05-18 MED ORDER — ONDANSETRON HCL 4 MG/2ML IJ SOLN
8.0000 mg | Freq: Once | INTRAMUSCULAR | Status: AC
  Administered 2016-05-18: 8 mg via INTRAVENOUS

## 2016-05-18 MED ORDER — MORPHINE SULFATE (PF) 4 MG/ML IV SOLN
INTRAVENOUS | Status: AC
  Filled 2016-05-18: qty 1

## 2016-05-18 MED ORDER — LORAZEPAM 0.5 MG PO TABS: ORAL_TABLET | ORAL | 0 refills | Status: DC

## 2016-05-18 MED ORDER — SODIUM CHLORIDE 0.9% FLUSH
10.0000 mL | Freq: Once | INTRAVENOUS | Status: AC
  Administered 2016-05-18: 10 mL via INTRAVENOUS
  Filled 2016-05-18: qty 10

## 2016-05-18 MED ORDER — ONDANSETRON HCL 8 MG PO TABS: 8.0000 mg | ORAL_TABLET | Freq: Three times a day (TID) | ORAL | 1 refills | Status: DC | PRN

## 2016-05-18 MED ORDER — HEPARIN SOD (PORK) LOCK FLUSH 100 UNIT/ML IV SOLN
500.0000 [IU] | Freq: Once | INTRAVENOUS | Status: AC
  Administered 2016-05-18: 500 [IU] via INTRAVENOUS
  Filled 2016-05-18: qty 5

## 2016-05-18 MED ORDER — SODIUM CHLORIDE 0.9 % IV SOLN: Freq: Once | INTRAVENOUS | Status: DC

## 2016-05-18 MED ORDER — SODIUM CHLORIDE 0.9 % IV SOLN
INTRAVENOUS | Status: AC
  Administered 2016-05-18: 15:00:00 via INTRAVENOUS

## 2016-05-18 NOTE — Telephone Encounter (Signed)
Returned call . Pt has been vomiting all night long and fasting for labs today. Pt stated [place] instructed him to decrease decadron to 1/2 tablet  twice a day -he decreased it yesterday. Pt also stated he was instructed not to take compazine but to take another medication for nausea.  I called his pharmacy and pt was prescribed "Hydroxyzine 50 mg qid for nausea and decadron 4 mg -take 1/2 to 1 tablet po bid." Per [facility] appt today and  IVF-appt given to pt for 1300 today-reported to [facility] Rn and xrt

## 2016-05-18 NOTE — Patient Instructions (Signed)

## 2016-05-18 NOTE — Progress Notes (Signed)
SYMPTOM MANAGEMENT CLINIC    Chief Complaint: Nausea, vomiting, headache, dehydration  HPI:  [name] 75 y.o. male diagnosed with both multiple myeloma and lung cancer; with brain metastasis.  Currently undergoing observation only; while awaiting test results.    No history exists.    Review of Systems  Constitutional: Positive for malaise/fatigue.  Gastrointestinal: Positive for nausea and vomiting.  Neurological: Positive for headaches.  All other systems reviewed and are negative.   Past Medical History:  Diagnosis Date  . Adenomatous polyp of colon 1996  . Arthritis    Osteoarthritis right knee, spine, wrist  . Arthritis of hand, right    Right thumb (Dr. Daylene Katayama)  . Back ache    r/o Multiple Myeloma  . Benign prostatic hypertrophy    (Dr. Karsten Ro in past)  . Degenerative disc disease   . Diverticulosis   . DVT (deep venous thrombosis) ([place]) 01/2014   R calf  . Encounter for antineoplastic chemotherapy 10/20/2015  . Erosive esophagitis   . GERD (gastroesophageal reflux disease)   . Hemorrhoids   . Hiatal hernia   . History of chronic prostatitis   . History of melanoma    Back (Dr. Wilhemina Bonito); early melanoma R arm 03/2011  . Multiple myeloma ([place]) 2013   skull lytic lesions--treated with radiation 07/2014  . Pulmonary embolism ([place]) 01/2014  . Radiation 07/21/14-08/05/14   left occipital parietal skull 25 gray  . SCC (squamous cell carcinoma) 08/04/15   right ear;dysplastic nevus with severe atypia-chest    Past Surgical History:  Procedure Laterality Date  . APPENDECTOMY    . BONE MARROW TRANSPLANT  03/2013   Stem Cell  . COLONOSCOPY  2009   adenomatous polyp  . ESOPHAGOGASTRODUODENOSCOPY  2009   mild inflammation at esophagogastric junction  . excision of melanoma     back ( x3)  . Great toe surgery     bilateral  . INGUINAL HERNIA REPAIR     left, x 2  . port placemet  10/2012  . SCALENE NODE BIOPSY Right 05/11/2016   Procedure: BIOPSY RIGHT  SCALENE NODE;  Surgeon: Grace Isaac, MD;  Location: [facility];  Service: Thoracic;  Laterality: Right;  . TOTAL KNEE ARTHROPLASTY  06/08/2011   Procedure: TOTAL KNEE ARTHROPLASTY;  Surgeon: Ninetta Lights, MD;  Location: [facility];  Service: Orthopedics;  Laterality: Right;  osteonics  . total knee replaced  2000   Left knee    has GERD; IRRITABLE BOWEL SYNDROME; ABDOMINAL PAIN-RUQ; PERSONAL HX COLONIC POLYPS; Dyspnea; Preop cardiovascular exam; Hyponatremia; Anemia; Multiple myeloma ([place]); DVT of leg (deep venous thrombosis) ([place]); Acute pulmonary embolism ([place]); Acute deep vein thrombosis (DVT) of tibial vein of right lower extremity ([place]); Abdominal aortic atherosclerosis ([place]); Headache; Long term current use of anticoagulant therapy; Peripheral edema; Knee pain, bilateral; Peripheral neuropathy due to chemotherapy ([place]); Brain metastases ([place]); Mediastinal adenopathy; Nausea with vomiting; and Dehydration on his problem list.    is allergic to tramadol; amoxicillin; and penicillins.    Medication List       Accurate as of 05/18/16  5:18 PM. Always use your most recent med list.          acetaminophen 500 MG tablet Commonly known as:  TYLENOL Take 500 mg by mouth every 6 (six) hours as needed for moderate pain or headache.   ALIGN 4 MG Caps Take 1 capsule by mouth daily. Reported on 09/10/2015   ascorbic acid 500 MG tablet Commonly known as:  VITAMIN C Take 500 mg by mouth 2 (two) times daily.   cholecalciferol 1000 units tablet Commonly known as:  VITAMIN D Take 1,000 Units by mouth daily.   desmopressin 0.1 MG tablet Commonly known as:  DDAVP Take 1 tablet (0.1 mg total) by mouth at bedtime.   dexamethasone 4 MG tablet Commonly known as:  DECADRON Take 4 mg by mouth every 6 (six) hours.   dicyclomine 10 MG capsule Commonly known as:  BENTYL Take 1 capsule (10 mg total) by mouth 3 (three) times daily before meals.   fish oil-omega-3 fatty acids 1000 MG capsule Take 1  g by mouth daily.   furosemide 20 MG tablet Commonly known as:  LASIX Take 1 tablet (20 mg total) by mouth daily.   LORazepam 0.5 MG tablet Commonly known as:  ATIVAN Take 1 tab PO Q 6 hours PRN nausea or anxiety   methocarbamol 500 MG tablet Commonly known as:  ROBAXIN TAKE 1 TO 2 TABLETS BY MOUTH EVERY 6 HOURS AS NEEDED FOR MUSCLE SPASMS   mirabegron ER 25 MG Tb24 tablet Commonly known as:  MYRBETRIQ Take 1 tablet (25 mg total) by mouth daily.   multivitamin with minerals Tabs tablet Take 1 tablet by mouth daily.   ondansetron 8 MG tablet Commonly known as:  ZOFRAN Take 1 tablet (8 mg total) by mouth every 8 (eight) hours as needed for nausea or vomiting.   oxyCODONE 5 MG immediate release tablet Commonly known as:  ROXICODONE Take 1-2 tabs PO Q 6 hours PRN pain.   oxyCODONE-acetaminophen 5-325 MG tablet Commonly known as:  PERCOCET/ROXICET Take 1 tablet by mouth every 4 (four) hours as needed.   pantoprazole 40 MG tablet Commonly known as:  PROTONIX Take 1 tablet by mouth two  times daily   prochlorperazine 10 MG tablet Commonly known as:  COMPAZINE Take 1 tablet (10 mg total) by mouth every 6 (six) hours as needed.   temazepam 15 MG capsule Commonly known as:  RESTORIL Take 1 capsule (15 mg total) by mouth at bedtime as needed.   XARELTO 20 MG Tabs tablet Generic drug:  rivaroxaban Take 1 tablet by mouth  daily   ZOMETA 4 MG/5ML injection Generic drug:  zolendronic acid Inject 4 mg into the vein every 3 (three) months.        PHYSICAL EXAMINATION  Oncology Vitals 05/18/2016 05/13/2016  Height 175 cm -  Weight 95.709 kg 94.439 kg  Weight (lbs) 211 lbs 208 lbs 3 oz  BMI (kg/m2) 31.16 kg/m2 30.75 kg/m2  Temp 97.5 98.7  Pulse 94 79  Resp - 12  Resp (Historical as of 02/09/12) - -  SpO2 99 95  BSA (m2) 2.16 m2 2.14 m2   BP Readings from Last 2 Encounters:  05/18/16 111/87  05/13/16 131/87    Physical Exam  Constitutional: He is oriented to  person, place, and time and well-developed, well-nourished, and in no distress.  HENT:  Head: Normocephalic and atraumatic.  Eyes: Conjunctivae and EOM are normal. Pupils are equal, round, and reactive to light.  Neck: Normal range of motion.  Pulmonary/Chest: Effort normal. No stridor. No respiratory distress.  Musculoskeletal: Normal range of motion.  Neurological: He is alert and oriented to person, place, and time. Gait normal.  Skin: Skin is warm and dry.  Psychiatric: Affect normal.  Nursing note and vitals reviewed.   LABORATORY DATA:. Hospital Outpatient Visit on 05/18/2016  Component Date Value Ref Range Status  . TSH 05/18/2016 2.911  0.320 -  4.118 m(IU)/L Final    RADIOGRAPHIC STUDIES: No results found.  ASSESSMENT/PLAN:    Nausea with vomiting Patient presented to the [facility] today with complaint of nausea/vomiting all through the last night.  He states that he took dexamethasone this morning and Atarax as directed with minimal effectiveness.  He then took a Compazine, which greatly helped with his nausea issues.  He feels dehydrated today.  Patient will receive IV fluid rehydration while at the cancer Center today.  He was also given Zofran 8 mg IV as well.  Dr. [name] in; and reviewed all with both patient and his wife today.  He recommended that patient continue with the dexamethasone as previously directed.  Also, patient will be prescribed both Zofran and Ativan to take as needed/directed for nausea.  Patient was advised to only take the Compazine is the other anti--nausea medications do not work.  Patient was also instructed to continue with the dexamethasone.  Patient stated that he felt much better after receiving IV fluid rehydration today; was also observed drinking ginger ale with no issues.  Patient was advised to call/return of electrical to the emergency department for any worsening symptoms whatsoever.  Multiple myeloma Patient is currently  holding his Revlimid oral therapy for multiple myeloma.  While he awaits further test results to determine plan of care.  Dehydration Patient presented to the [facility] today with complaint of nausea/vomiting all through the last night.  He states that he took dexamethasone this morning and Atarax as directed with minimal effectiveness.  He then took a Compazine, which greatly helped with his nausea issues.  He feels dehydrated today.  Patient will receive IV fluid rehydration while at the cancer Center today.  He was also given Zofran 8 mg IV as well.  Dr. [name] in; and reviewed all with both patient and his wife today.  He recommended that patient continue with the dexamethasone as previously directed.  Also, patient will be prescribed both Zofran and Ativan to take as needed/directed for nausea.  Patient was advised to only take the Compazine is the other anti--nausea medications do not work.  Patient was also instructed to continue with the dexamethasone.  Patient stated that he felt much better after receiving IV fluid rehydration today; was also observed drinking ginger ale with no issues.  Patient was advised to call/return of electrical to the emergency department for any worsening symptoms whatsoever.  Brain metastases [facility]) Patient has recently been diagnosed with lung cancer; with multiple brain metastasis.  He has seen Dr. Sherwood Gambler neurosurgeon recently as well.  Patient was given Zofran IV; as well as morphine 2 mg IV for complaint of headache today.  Also, patient requested and was given a refill of the oxycodone for treatment of his headache as well.  Patient is scheduled for labs and his initial brain radiation on Friday, 05/20/2016.  He is scheduled for labs and a follow-up visit with Dr. [name] on 05/27/2016.   Patient stated understanding of all instructions; and was in agreement with this plan of care. The patient knows to call the clinic with any problems, questions  or concerns.   Total time spent with patient was 25 minutes;  with greater than 75 percent of that time spent in face to face counseling regarding patient's symptoms,  and coordination of care and follow up.  Disclaimer:This dictation was prepared with Dragon/digital dictation along with [employer]. Any transcriptional errors that result from this process are unintentional.  Drue Second, NP 05/18/2016  ADDENDUM: Hematology/Oncology Attending: I had a face to face encounter with the patient today. I recommended his care plan. This is a very pleasant 75 years old white male with history of multiple myeloma status post several regimens and was on maintenance treatment with Revlimid until recently when he was found to have metastatic brain lesions and further workup including CT scan of the chest, abdomen and pelvis and PET scan showed findings concerning for metastatic lung cancer. The recent biopsy from the mediastinal lymph node was consistent with metastatic lung adenocarcinoma. The tissue block was sent for molecular studies as well as PDL 1 expression. The patient is expected to have stereotactic radiotherapy to the multiple metastatic brain lesion on 05/20/2016. Over the last few days he has been complaining of worsening headache as well as nausea and vomiting last night. He was advised by the neurosurgeon to decrease the dose of Decadron and his symptoms got worse after the dose was decreased dose to 2 mg by mouth twice a day. He was also advised by neurosurgery not to take Compazine which she has been using for years for his nausea during his treatment for the multiple myeloma for concern of extrapyramidal side effects and increased risk of seizure. I had a lengthy discussion with the patient today about his current condition and further treatment options. I recommended for the patient to increase his dose of Decadron to 4 mg by mouth twice a day again until completion of the  stereotactic radiotherapy and then will taper his dose. Decadron is also important for treatment of his multiple myeloma. For the nausea I will consider the patient for treatment with Zofran and he will receive IV hydration as well as IV morphine for the headache today. Regarding his metastatic lung cancer, I will send a blood sample for EGFR mutation is studies. We will also rule out for the final molecular studies and PDL 1 expression before starting the patient on any systemic therapy. I will see him back for follow-up visit in one week for reevaluation. He was advised to call immediately if he has any concerning symptoms in the interval.  Disclaimer: This note was dictated with voice recognition software. Similar sounding words can inadvertently be transcribed and may be missed upon review. Eilleen Kempf., MD 05/18/16

## 2016-05-18 NOTE — Assessment & Plan Note (Signed)
Patient presented to the [facility] today with complaint of nausea/vomiting all through the last night.  He states that he took dexamethasone this morning and Atarax as directed with minimal effectiveness.  He then took a Compazine, which greatly helped with his nausea issues.  He feels dehydrated today.  Patient will receive IV fluid rehydration while at the cancer Center today.  He was also given Zofran 8 mg IV as well.  Dr. [name] in; and reviewed all with both patient and his wife today.  He recommended that patient continue with the dexamethasone as previously directed.  Also, patient will be prescribed both Zofran and Ativan to take as needed/directed for nausea.  Patient was advised to only take the Compazine is the other anti--nausea medications do not work.  Patient was also instructed to continue with the dexamethasone.  Patient stated that he felt much better after receiving IV fluid rehydration today; was also observed drinking ginger ale with no issues.  Patient was advised to call/return of electrical to the emergency department for any worsening symptoms whatsoever.

## 2016-05-18 NOTE — Assessment & Plan Note (Signed)
Patient is currently holding his Revlimid oral therapy for multiple myeloma.  While he awaits further test results to determine plan of care. 

## 2016-05-18 NOTE — Assessment & Plan Note (Signed)
Patient presented to the [facility] today with complaint of nausea/vomiting all through the last night.  He states that he took dexamethasone this morning and Atarax as directed with minimal effectiveness.  He then took a Compazine, which greatly helped with his nausea issues.  He feels dehydrated today.  Patient will receive IV fluid rehydration while at the cancer Center today.  He was also given Zofran 8 mg IV as well.  Dr. [name] in; and reviewed all with both patient and his wife today.  He recommended that patient continue with the dexamethasone as previously directed.  Also, patient will be prescribed both Zofran and Ativan to take as needed/directed for nausea.  Patient was advised to only take the Compazine is the other anti--nausea medications do not work.  Patient was also instructed to continue with the dexamethasone.  Patient stated that he felt much better after receiving IV fluid rehydration today; was also observed drinking ginger ale with no issues.  Patient was advised to call/return of electrical to the emergency department for any worsening symptoms whatsoever.

## 2016-05-18 NOTE — Assessment & Plan Note (Addendum)
Patient has recently been diagnosed with lung cancer; with multiple brain metastasis.  He has seen Dr. Sherwood Gambler neurosurgeon recently as well.  Patient was given Zofran IV; as well as morphine 2 mg IV for complaint of headache today.  Also, patient requested and was given a refill of the oxycodone for treatment of his headache as well.  Patient is scheduled for labs and his initial brain radiation on Friday, 05/20/2016.  He is scheduled for labs and a follow-up visit with Dr. [name] on 05/27/2016.

## 2016-05-19 ENCOUNTER — Encounter: Payer: Self-pay | Admitting: *Deleted

## 2016-05-19 DIAGNOSIS — E349 Endocrine disorder, unspecified: Secondary | ICD-10-CM | POA: Diagnosis not present

## 2016-05-19 DIAGNOSIS — E232 Diabetes insipidus: Secondary | ICD-10-CM | POA: Diagnosis not present

## 2016-05-19 LAB — T4, FREE: T4,Free(Direct): 1.24 ng/dL (ref 0.82–1.77)

## 2016-05-19 LAB — T4: T4 TOTAL: 6.1 ug/dL (ref 4.5–12.0)

## 2016-05-19 LAB — T3 UPTAKE
Free Thyroxine Index: 1.7 (ref 1.2–4.9)
T3 UPTAKE RATIO: 28 % (ref 24–39)

## 2016-05-19 LAB — CORTISOL: Cortisol: 15.3 ug/dL

## 2016-05-19 NOTE — Progress Notes (Signed)
Can you send this over to Dr. Donnella Bi office with neurosurgery?

## 2016-05-19 NOTE — Progress Notes (Signed)
[place] results to Dr. Caprice Kluver office TSH,T4,cortisol, T3 uptake,T4 Free and Thyroid panel with TSH at fax number [phone]

## 2016-05-20 ENCOUNTER — Other Ambulatory Visit: Payer: Self-pay

## 2016-05-20 ENCOUNTER — Encounter: Payer: Self-pay | Admitting: *Deleted

## 2016-05-20 ENCOUNTER — Ambulatory Visit
Admission: RE | Admit: 2016-05-20 | Discharge: 2016-05-20 | Disposition: A | Discharge status: Home / Self Care | Payer: Medicare Other | Source: Ambulatory Visit | Attending: Radiation Oncology | Admitting: Radiation Oncology

## 2016-05-20 ENCOUNTER — Encounter: Payer: Self-pay | Admitting: Radiation Oncology

## 2016-05-20 VITALS — BP 123/75 | HR 70 | Temp 97.6°F | Resp 20

## 2016-05-20 DIAGNOSIS — C7931 Secondary malignant neoplasm of brain: Secondary | ICD-10-CM

## 2016-05-20 DIAGNOSIS — Z833 Family history of diabetes mellitus: Secondary | ICD-10-CM | POA: Diagnosis not present

## 2016-05-20 DIAGNOSIS — Z79899 Other long term (current) drug therapy: Secondary | ICD-10-CM | POA: Diagnosis not present

## 2016-05-20 DIAGNOSIS — C9000 Multiple myeloma not having achieved remission: Secondary | ICD-10-CM | POA: Diagnosis not present

## 2016-05-20 DIAGNOSIS — C3491 Malignant neoplasm of unspecified part of right bronchus or lung: Secondary | ICD-10-CM | POA: Diagnosis not present

## 2016-05-20 DIAGNOSIS — Z87891 Personal history of nicotine dependence: Secondary | ICD-10-CM | POA: Diagnosis not present

## 2016-05-20 DIAGNOSIS — Z823 Family history of stroke: Secondary | ICD-10-CM | POA: Diagnosis not present

## 2016-05-20 DIAGNOSIS — Z85820 Personal history of malignant melanoma of skin: Secondary | ICD-10-CM | POA: Diagnosis not present

## 2016-05-20 DIAGNOSIS — Z85858 Personal history of malignant neoplasm of other endocrine glands: Secondary | ICD-10-CM | POA: Diagnosis not present

## 2016-05-20 DIAGNOSIS — R590 Localized enlarged lymph nodes: Secondary | ICD-10-CM

## 2016-05-20 DIAGNOSIS — Z9889 Other specified postprocedural states: Secondary | ICD-10-CM | POA: Diagnosis not present

## 2016-05-20 DIAGNOSIS — Z8042 Family history of malignant neoplasm of prostate: Secondary | ICD-10-CM | POA: Diagnosis not present

## 2016-05-20 DIAGNOSIS — E237 Disorder of pituitary gland, unspecified: Secondary | ICD-10-CM | POA: Diagnosis not present

## 2016-05-20 DIAGNOSIS — Z8249 Family history of ischemic heart disease and other diseases of the circulatory system: Secondary | ICD-10-CM | POA: Diagnosis not present

## 2016-05-20 DIAGNOSIS — Z510 Encounter for antineoplastic radiation therapy: Secondary | ICD-10-CM | POA: Diagnosis not present

## 2016-05-20 NOTE — Op Note (Signed)
Stereotactic Radiosurgery Operative Note  Name: [name] MRN: [id number]  Date: 05/20/2016  DOB: 05-28-1941  Op Note  Pre Operative Diagnosis:  Lung cancer with multiple brain metastases including to the pituitary gland and pituitary stalk (which directly abuts the optic chiasm and tracts), as well as multiple (8) cerebellar and cerebral lesions  Post Operative Diagnois:  Lung cancer with multiple brain metastases including to the pituitary gland and pituitary stalk (which directly abuts the optic chiasm and tracts), as well as multiple (8) cerebellar and cerebral lesions  3D TREATMENT PLANNING AND DOSIMETRY:  The patient's radiation plan was reviewed and approved by myself (neurosurgery) and Dr. Kyung Rudd (radiation oncology) prior to treatment.  It showed 3-dimensional radiation distributions overlaid onto the planning CT/MRI image set.  The [facility] for the target structures as well as the organs at risk were reviewed. The documentation of the 3D plan and dosimetry are filed in the radiation oncology EMR.  NARRATIVE:  [name] was brought to the True[employer]am stereotactic radiation treatment machine and placed supine on the CT couch. The head frame was applied, and the patient was set up for stereotactic radiosurgery.  I was present for the set-up and delivery.  SIMULATION VERIFICATION:  In the couch zero-angle position, the patient underwent Exactrac imaging using the Brainlab system with orthogonal KV images.  These were carefully aligned and repeated to confirm treatment position for each of the isocenters.  The Exactrac snap film verification was repeated at each couch angle.  SPECIAL TREATMENT PROCEDURE: [name] received stereotactic radiosurgery to the following targets:   Complex treatment (because of immediate proximity of the optic chiasm and tracts) to the pituitary gland and pituitary stalk target was treated using one of the single isocenter, multitarget plans.   Today's treatment, to a dose of 5 Gy, was the first of 5 fractions to a planned prescription dose of 25 Gy.  ExacTrac registration was performed for each couch angle.  The 100% isodose line was prescribed.  3 simple cerebellar and 5 simple cerebral targets were treated using 2 single isocenter, multitarget plans to a prescription dose of 20 Gy for each lesion.  ExacTrac registration was performed for each couch angle.  The 100% isodose line was prescribed.  STEREOTACTIC TREATMENT MANAGEMENT:  Following delivery, the patient was transported to nursing in stable condition and monitored for possible acute effects. The patient tolerated treatment without significant acute effects, and was discharged to home in stable condition.    PLAN: Follow-up in one month.

## 2016-05-20 NOTE — Progress Notes (Signed)
Oncology Nurse Navigator Documentation  Oncology Nurse Navigator Flowsheets 05/20/2016  Navigator Location [facility]  Navigator Encounter Type Other/placed verbal order for EGFR blood draw for today.   Treatment Phase Pre-Tx/Tx Discussion  Barriers/Navigation Needs Coordination of Care  Education Other  Interventions Coordination of Care  Coordination of Care Other  Acuity Level 1  Acuity Level 1 Minimal follow up required  Time Spent with Patient 15

## 2016-05-20 NOTE — Progress Notes (Signed)
Patient arrived ambulatory steadyu gait s/p 1/5 SRS brain, no c/o head ache  pain,nausea, dizzy ness, or blurred vision, no driving emphasized, monitor 30 minutes  ,will d/c with instructions to call on call MD for any symptoms not normal for you this evening, nausea, vomiting, uncontrolled head ache, verbal understanding, wife at side, she stated"I'm driving' .4:53 PM

## 2016-05-21 ENCOUNTER — Other Ambulatory Visit: Payer: Self-pay | Admitting: Internal Medicine

## 2016-05-23 ENCOUNTER — Ambulatory Visit
Admission: RE | Admit: 2016-05-23 | Discharge: 2016-05-23 | Disposition: A | Discharge status: Home / Self Care | Payer: Medicare Other | Source: Ambulatory Visit | Attending: Radiation Oncology | Admitting: Radiation Oncology

## 2016-05-23 ENCOUNTER — Encounter: Payer: Self-pay | Admitting: Radiation Oncology

## 2016-05-23 VITALS — BP 108/84 | HR 71 | Temp 97.9°F | Resp 20

## 2016-05-23 DIAGNOSIS — C7931 Secondary malignant neoplasm of brain: Secondary | ICD-10-CM

## 2016-05-23 DIAGNOSIS — Z79899 Other long term (current) drug therapy: Secondary | ICD-10-CM | POA: Diagnosis not present

## 2016-05-23 DIAGNOSIS — Z823 Family history of stroke: Secondary | ICD-10-CM | POA: Diagnosis not present

## 2016-05-23 DIAGNOSIS — Z85858 Personal history of malignant neoplasm of other endocrine glands: Secondary | ICD-10-CM | POA: Diagnosis not present

## 2016-05-23 DIAGNOSIS — Z8042 Family history of malignant neoplasm of prostate: Secondary | ICD-10-CM | POA: Diagnosis not present

## 2016-05-23 DIAGNOSIS — Z85820 Personal history of malignant melanoma of skin: Secondary | ICD-10-CM | POA: Diagnosis not present

## 2016-05-23 DIAGNOSIS — Z510 Encounter for antineoplastic radiation therapy: Secondary | ICD-10-CM | POA: Diagnosis not present

## 2016-05-23 DIAGNOSIS — Z9889 Other specified postprocedural states: Secondary | ICD-10-CM | POA: Diagnosis not present

## 2016-05-23 DIAGNOSIS — Z833 Family history of diabetes mellitus: Secondary | ICD-10-CM | POA: Diagnosis not present

## 2016-05-23 DIAGNOSIS — C3491 Malignant neoplasm of unspecified part of right bronchus or lung: Secondary | ICD-10-CM | POA: Diagnosis not present

## 2016-05-23 DIAGNOSIS — Z87891 Personal history of nicotine dependence: Secondary | ICD-10-CM | POA: Diagnosis not present

## 2016-05-23 DIAGNOSIS — Z8249 Family history of ischemic heart disease and other diseases of the circulatory system: Secondary | ICD-10-CM | POA: Diagnosis not present

## 2016-05-23 NOTE — Progress Notes (Addendum)
BP 108/84 (BP Location: Right Arm, Patient Position: Sitting, Cuff Size: Large)   Pulse 71   Temp 97.9 F (36.6 C) (Oral)   Resp 20 S/p SRS #2/5 brain completed, ambulatory, no c/o pain, nausea, dizzy ness today or blurred vision, had nausea Saturday morning until non on Sunday, offered graham crackers & p nut butter, TV on, call bell within reach, vitals stable, monitor 15 minutes, patient  d/c home 145pm with instructions to call us for increased nausea, blurred vision, or any other symp toms not normal for him ./

## 2016-05-24 ENCOUNTER — Telehealth: Payer: Self-pay | Admitting: Radiation Therapy

## 2016-05-24 ENCOUNTER — Other Ambulatory Visit: Payer: Self-pay | Admitting: Medical Oncology

## 2016-05-24 ENCOUNTER — Ambulatory Visit (HOSPITAL_BASED_OUTPATIENT_CLINIC_OR_DEPARTMENT_OTHER): Payer: Medicare Other

## 2016-05-24 ENCOUNTER — Ambulatory Visit: Payer: Self-pay

## 2016-05-24 ENCOUNTER — Telehealth: Payer: Self-pay | Admitting: Medical Oncology

## 2016-05-24 ENCOUNTER — Encounter: Payer: Self-pay | Admitting: *Deleted

## 2016-05-24 VITALS — BP 106/76 | HR 67 | Temp 98.2°F | Resp 16

## 2016-05-24 DIAGNOSIS — R112 Nausea with vomiting, unspecified: Secondary | ICD-10-CM

## 2016-05-24 DIAGNOSIS — C9000 Multiple myeloma not having achieved remission: Secondary | ICD-10-CM | POA: Diagnosis not present

## 2016-05-24 MED ORDER — SODIUM CHLORIDE 0.9 % IV SOLN
INTRAVENOUS | Status: DC
  Administered 2016-05-24: 15:00:00 via INTRAVENOUS

## 2016-05-24 MED ORDER — SODIUM CHLORIDE 0.9 % IV SOLN: Freq: Once | INTRAVENOUS | Status: DC

## 2016-05-24 MED ORDER — HEPARIN SOD (PORK) LOCK FLUSH 100 UNIT/ML IV SOLN
500.0000 [IU] | Freq: Once | INTRAVENOUS | Status: AC
  Administered 2016-05-24: 500 [IU] via INTRAVENOUS
  Filled 2016-05-24: qty 5

## 2016-05-24 MED ORDER — ONDANSETRON HCL 4 MG/2ML IJ SOLN
INTRAMUSCULAR | Status: AC
  Filled 2016-05-24: qty 4

## 2016-05-24 MED ORDER — SODIUM CHLORIDE 0.9% FLUSH
10.0000 mL | INTRAVENOUS | Status: DC | PRN
  Administered 2016-05-24: 10 mL via INTRAVENOUS
  Filled 2016-05-24: qty 10

## 2016-05-24 MED ORDER — ONDANSETRON HCL 4 MG/2ML IJ SOLN
8.0000 mg | Freq: Once | INTRAMUSCULAR | Status: AC
  Administered 2016-05-24: 8 mg via INTRAVENOUS

## 2016-05-24 NOTE — Telephone Encounter (Signed)
[name] is still struggling with nausea and vomiting. I received a message from [place] that he was up all night again and unable to hold anything down. He was given an RX for Zofran on 11/13 by Dr. Lisbeth Renshaw, but did not take this until the middle of the night when the compazine was not working.   Dr. Lisbeth Renshaw has requested that he start taking the Zofran every 8 hours and then supplement with the compazine as needed. He feels that using the Zofran as his base med, and keeping it in his system, will better control the nausea and vomiting that [name] is experiencing. The patient understands these instructions and will give [place] feedback on how this helps his symptoms. He also informed me that he is scheduled for IV fluids this afternoon at 3:00. This should also help him to feel better.  Mont Dutton R.T.(R)(T) Special Procedures Navigator

## 2016-05-24 NOTE — Patient Instructions (Signed)
Dehydration, Adult Dehydration is a condition in which there is not enough fluid or water in the body. This happens when you lose more fluids than you take in. Important organs, such as the kidneys, brain, and heart, cannot function without a proper amount of fluids. Any loss of fluids from the body can lead to dehydration. Dehydration can range from mild to severe. This condition should be treated right away to prevent it from becoming severe. What are the causes? This condition may be caused by:  Vomiting.  Diarrhea.  Excessive sweating, such as from heat exposure or exercise.  Not drinking enough fluid, especially:  When ill.  While doing activity that requires a lot of energy.  Excessive urination.  Fever.  Infection.  Certain medicines, such as medicines that cause the body to lose excess fluid (diuretics).  Inability to access safe drinking water.  Reduced physical ability to get adequate water and food. What increases the risk? This condition is more likely to develop in people:  Who have a poorly controlled long-term (chronic) illness, such as diabetes, heart disease, or kidney disease.  Who are age 65 or older.  Who are disabled.  Who live in a place with high altitude.  Who play endurance sports. What are the signs or symptoms? Symptoms of mild dehydration may include:   Thirst.  Dry lips.  Slightly dry mouth.  Dry, warm skin.  Dizziness. Symptoms of moderate dehydration may include:   Very dry mouth.  Muscle cramps.  Dark urine. Urine may be the color of tea.  Decreased urine production.  Decreased tear production.  Heartbeat that is irregular or faster than normal (palpitations).  Headache.  Light-headedness, especially when you stand up from a sitting position.  Fainting (syncope). Symptoms of severe dehydration may include:   Changes in skin, such as:  Cold and clammy skin.  Blotchy (mottled) or pale skin.  Skin that does  not quickly return to normal after being lightly pinched and released (poor skin turgor).  Changes in body fluids, such as:  Extreme thirst.  No tear production.  Inability to sweat when body temperature is high, such as in hot weather.  Very little urine production.  Changes in vital signs, such as:  Weak pulse.  Pulse that is more than 100 beats a minute when sitting still.  Rapid breathing.  Low blood pressure.  Other changes, such as:  Sunken eyes.  Cold hands and feet.  Confusion.  Lack of energy (lethargy).  Difficulty waking up from sleep.  Short-term weight loss.  Unconsciousness. How is this diagnosed? This condition is diagnosed based on your symptoms and a physical exam. Blood and urine tests may be done to help confirm the diagnosis. How is this treated? Treatment for this condition depends on the severity. Mild or moderate dehydration can often be treated at home. Treatment should be started right away. Do not wait until dehydration becomes severe. Severe dehydration is an emergency and it needs to be treated in a hospital. Treatment for mild dehydration may include:   Drinking more fluids.  Replacing salts and minerals in your blood (electrolytes) that you may have lost. Treatment for moderate dehydration may include:   Drinking an oral rehydration solution (ORS). This is a drink that helps you replace fluids and electrolytes (rehydrate). It can be found at pharmacies and retail stores. Treatment for severe dehydration may include:   Receiving fluids through an IV tube.  Receiving an electrolyte solution through a feeding tube that is   passed through your nose and into your stomach (nasogastric tube, or NG tube).  Correcting any abnormalities in electrolytes.  Treating the underlying cause of dehydration. Follow these instructions at home:  If directed by your health care provider, drink an ORS:  Make an ORS by following instructions on the  package.  Start by drinking small amounts, about  cup (120 mL) every 5-10 minutes.  Slowly increase how much you drink until you have taken the amount recommended by your health care provider.  Drink enough clear fluid to keep your urine clear or pale yellow. If you were told to drink an ORS, finish the ORS first, then start slowly drinking other clear fluids. Drink fluids such as:  Water. Do not drink only water. Doing that can lead to having too little salt (sodium) in the body (hyponatremia).  Ice chips.  Fruit juice that you have added water to (diluted fruit juice).  Low-calorie sports drinks.  Avoid:  Alcohol.  Drinks that contain a lot of sugar. These include high-calorie sports drinks, fruit juice that is not diluted, and soda.  Caffeine.  Foods that are greasy or contain a lot of fat or sugar.  Take over-the-counter and prescription medicines only as told by your health care provider.  Do not take sodium tablets. This can lead to having too much sodium in the body (hypernatremia).  Eat foods that contain a healthy balance of electrolytes, such as bananas, oranges, potatoes, tomatoes, and spinach.  Keep all follow-up visits as told by your health care provider. This is important. Contact a health care provider if:  You have abdominal pain that:  Gets worse.  Stays in one area (localizes).  You have a rash.  You have a stiff neck.  You are more irritable than usual.  You are sleepier or more difficult to wake up than usual.  You feel weak or dizzy.  You feel very thirsty.  You have urinated only a small amount of very dark urine over 6-8 hours. Get help right away if:  You have symptoms of severe dehydration.  You cannot drink fluids without vomiting.  Your symptoms get worse with treatment.  You have a fever.  You have a severe headache.  You have vomiting or diarrhea that:  Gets worse.  Does not go away.  You have blood or green matter  (bile) in your vomit.  You have blood in your stool. This may cause stool to look black and tarry.  You have not urinated in 6-8 hours.  You faint.  Your heart rate while sitting still is over 100 beats a minute.  You have trouble breathing. This information is not intended to replace advice given to you by your health care provider. Make sure you discuss any questions you have with your health care provider. Document Released: 06/27/2005 Document Revised: 01/22/2016 Document Reviewed: 08/21/2015 Elsevier Interactive Patient Education  2017 [employer]  

## 2016-05-24 NOTE — Telephone Encounter (Signed)
Up all night vomiting. None of his antiemetics are working. Can't sleep -cc Dr Lisbeth Renshaw and xrt staff.

## 2016-05-24 NOTE — Telephone Encounter (Signed)
PEr [name] -pt coming in today at 2pm for IVF , antiemetic

## 2016-05-24 NOTE — Progress Notes (Signed)
Oncology Nurse Navigator Documentation  Oncology Nurse Navigator Flowsheets 05/24/2016  Navigator Location [facility]  Navigator Encounter Type Treatment/spoke with patient and wife today.  He was to receive chemo today but he received iv fluids.  He is having a lot of side effects of brain radiation therapy. I offered support and ask that he call if needed.   Treatment Phase Treatment  Barriers/Navigation Needs (No Data)  Acuity Level 1  Time Spent with Patient 15

## 2016-05-25 ENCOUNTER — Ambulatory Visit (HOSPITAL_COMMUNITY)
Admission: RE | Admit: 2016-05-25 | Discharge: 2016-05-25 | Disposition: A | Discharge status: Home / Self Care | Payer: Medicare Other | Source: Ambulatory Visit | Attending: Radiation Oncology | Admitting: Radiation Oncology

## 2016-05-25 ENCOUNTER — Other Ambulatory Visit: Payer: Self-pay | Admitting: *Deleted

## 2016-05-25 ENCOUNTER — Ambulatory Visit
Admission: RE | Admit: 2016-05-25 | Discharge: 2016-05-25 | Disposition: A | Discharge status: Home / Self Care | Payer: Medicare Other | Source: Ambulatory Visit | Attending: Radiation Oncology | Admitting: Radiation Oncology

## 2016-05-25 ENCOUNTER — Encounter: Payer: Self-pay | Admitting: Radiation Oncology

## 2016-05-25 ENCOUNTER — Other Ambulatory Visit: Payer: Self-pay | Admitting: Radiation Oncology

## 2016-05-25 ENCOUNTER — Encounter (HOSPITAL_COMMUNITY): Payer: Self-pay

## 2016-05-25 ENCOUNTER — Ambulatory Visit (HOSPITAL_BASED_OUTPATIENT_CLINIC_OR_DEPARTMENT_OTHER): Payer: Medicare Other

## 2016-05-25 VITALS — BP 96/75 | HR 92 | Temp 97.7°F | Resp 20

## 2016-05-25 DIAGNOSIS — Z85858 Personal history of malignant neoplasm of other endocrine glands: Secondary | ICD-10-CM | POA: Diagnosis not present

## 2016-05-25 DIAGNOSIS — Z79899 Other long term (current) drug therapy: Secondary | ICD-10-CM | POA: Diagnosis not present

## 2016-05-25 DIAGNOSIS — R1114 Bilious vomiting: Secondary | ICD-10-CM

## 2016-05-25 DIAGNOSIS — C7931 Secondary malignant neoplasm of brain: Secondary | ICD-10-CM

## 2016-05-25 DIAGNOSIS — Z85820 Personal history of malignant melanoma of skin: Secondary | ICD-10-CM | POA: Diagnosis not present

## 2016-05-25 DIAGNOSIS — R112 Nausea with vomiting, unspecified: Secondary | ICD-10-CM | POA: Diagnosis not present

## 2016-05-25 DIAGNOSIS — R1113 Vomiting of fecal matter: Secondary | ICD-10-CM

## 2016-05-25 DIAGNOSIS — Z87891 Personal history of nicotine dependence: Secondary | ICD-10-CM | POA: Diagnosis not present

## 2016-05-25 DIAGNOSIS — C7949 Secondary malignant neoplasm of other parts of nervous system: Principal | ICD-10-CM

## 2016-05-25 DIAGNOSIS — Z510 Encounter for antineoplastic radiation therapy: Secondary | ICD-10-CM | POA: Diagnosis not present

## 2016-05-25 DIAGNOSIS — Z8249 Family history of ischemic heart disease and other diseases of the circulatory system: Secondary | ICD-10-CM | POA: Diagnosis not present

## 2016-05-25 DIAGNOSIS — C9000 Multiple myeloma not having achieved remission: Secondary | ICD-10-CM

## 2016-05-25 DIAGNOSIS — C3491 Malignant neoplasm of unspecified part of right bronchus or lung: Secondary | ICD-10-CM | POA: Diagnosis not present

## 2016-05-25 DIAGNOSIS — Z9889 Other specified postprocedural states: Secondary | ICD-10-CM | POA: Diagnosis not present

## 2016-05-25 DIAGNOSIS — Z8042 Family history of malignant neoplasm of prostate: Secondary | ICD-10-CM | POA: Diagnosis not present

## 2016-05-25 DIAGNOSIS — Z823 Family history of stroke: Secondary | ICD-10-CM | POA: Diagnosis not present

## 2016-05-25 DIAGNOSIS — Z833 Family history of diabetes mellitus: Secondary | ICD-10-CM | POA: Diagnosis not present

## 2016-05-25 LAB — CBC WITH DIFFERENTIAL/PLATELET
BASO%: 0 % (ref 0.0–2.0)
Basophils Absolute: 0 10*3/uL (ref 0.0–0.1)
EOS%: 0.2 % (ref 0.0–7.0)
Eosinophils Absolute: 0 10*3/uL (ref 0.0–0.5)
HCT: 38.3 % — ABNORMAL LOW (ref 38.4–49.9)
HGB: 13.3 g/dL (ref 13.0–17.1)
LYMPH#: 0.4 10*3/uL — AB (ref 0.9–3.3)
LYMPH%: 6.5 % — ABNORMAL LOW (ref 14.0–49.0)
MCH: 32.2 pg (ref 27.2–33.4)
MCHC: 34.7 g/dL (ref 32.0–36.0)
MCV: 92.7 fL (ref 79.3–98.0)
MONO#: 0.3 10*3/uL (ref 0.1–0.9)
MONO%: 4.9 % (ref 0.0–14.0)
NEUT%: 88.4 % — AB (ref 39.0–75.0)
NEUTROS ABS: 5.4 10*3/uL (ref 1.5–6.5)
PLATELETS: 174 10*3/uL (ref 140–400)
RBC: 4.13 10*6/uL — AB (ref 4.20–5.82)
RDW: 15.6 % — AB (ref 11.0–14.6)
WBC: 6.1 10*3/uL (ref 4.0–10.3)

## 2016-05-25 LAB — BASIC METABOLIC PANEL
ANION GAP: 9 meq/L (ref 3–11)
BUN: 10.7 mg/dL (ref 7.0–26.0)
CALCIUM: 9.6 mg/dL (ref 8.4–10.4)
CO2: 25 mEq/L (ref 22–29)
Chloride: 102 mEq/L (ref 98–109)
Creatinine: 0.9 mg/dL (ref 0.7–1.3)
EGFR: 79 mL/min/{1.73_m2} — AB (ref >90)
Glucose: 118 mg/dl (ref 70–140)
POTASSIUM: 3.6 meq/L (ref 3.5–5.1)
SODIUM: 137 meq/L (ref 136–145)

## 2016-05-25 LAB — PROTIME-INR
INR: 1.3 — ABNORMAL LOW (ref 2.00–3.50)
PROTIME: 15.6 s — AB (ref 10.6–13.4)

## 2016-05-25 MED ORDER — ONDANSETRON HCL 4 MG/2ML IJ SOLN
8.0000 mg | Freq: Once | INTRAMUSCULAR | Status: AC
  Administered 2016-05-25: 8 mg via INTRAVENOUS

## 2016-05-25 MED ORDER — SODIUM CHLORIDE 0.9 % IV SOLN
Freq: Once | INTRAVENOUS | Status: AC
  Administered 2016-05-25: 16:00:00 via INTRAVENOUS

## 2016-05-25 MED ORDER — SODIUM CHLORIDE 0.9% FLUSH
10.0000 mL | INTRAVENOUS | Status: DC | PRN
  Administered 2016-05-25: 10 mL via INTRAVENOUS
  Filled 2016-05-25: qty 10

## 2016-05-25 MED ORDER — ONDANSETRON HCL 4 MG/2ML IJ SOLN
INTRAMUSCULAR | Status: AC
  Filled 2016-05-25: qty 4

## 2016-05-25 MED ORDER — SODIUM CHLORIDE 0.9 % IV SOLN: Freq: Once | INTRAVENOUS | Status: DC

## 2016-05-25 MED ORDER — HEPARIN SOD (PORK) LOCK FLUSH 100 UNIT/ML IV SOLN
500.0000 [IU] | Freq: Once | INTRAVENOUS | Status: AC
  Administered 2016-05-25: 500 [IU] via INTRAVENOUS
  Filled 2016-05-25: qty 5

## 2016-05-25 NOTE — Progress Notes (Addendum)
Patient ambulated to nursing room 1 from [place] #!,steady gait, is taking Zofran  Every 8 hours, but still throws up at night stated, and has had 1-2 episodes of diarrhea stated, still slight nausea,  Offered sprite and graham crackers, ,T.V. Turned on, call bell with in reach, monitor patient for 15 minutes, and MD Dr. Lisbeth Renshaw to see the patient before discharge 1:24 PM  BP 96/75 (BP Location: Right Arm, Patient Position: Sitting, Cuff Size: Normal)   Pulse 92   Temp 97.7 F (36.5 C) (Oral)   Resp 20   Wt Readings from Last 3 Encounters:  05/18/16 211 lb (95.7 kg)  05/13/16 208 lb 3.2 oz (94.4 kg)  05/09/16 203 lb 4.8 oz (92.2 kg)

## 2016-05-25 NOTE — Patient Instructions (Signed)
Dehydration, Adult Dehydration is a condition in which there is not enough fluid or water in the body. This happens when you lose more fluids than you take in. Important organs, such as the kidneys, brain, and heart, cannot function without a proper amount of fluids. Any loss of fluids from the body can lead to dehydration. Dehydration can range from mild to severe. This condition should be treated right away to prevent it from becoming severe. What are the causes? This condition may be caused by:  Vomiting.  Diarrhea.  Excessive sweating, such as from heat exposure or exercise.  Not drinking enough fluid, especially:  When ill.  While doing activity that requires a lot of energy.  Excessive urination.  Fever.  Infection.  Certain medicines, such as medicines that cause the body to lose excess fluid (diuretics).  Inability to access safe drinking water.  Reduced physical ability to get adequate water and food. What increases the risk? This condition is more likely to develop in people:  Who have a poorly controlled long-term (chronic) illness, such as diabetes, heart disease, or kidney disease.  Who are age 65 or older.  Who are disabled.  Who live in a place with high altitude.  Who play endurance sports. What are the signs or symptoms? Symptoms of mild dehydration may include:   Thirst.  Dry lips.  Slightly dry mouth.  Dry, warm skin.  Dizziness. Symptoms of moderate dehydration may include:   Very dry mouth.  Muscle cramps.  Dark urine. Urine may be the color of tea.  Decreased urine production.  Decreased tear production.  Heartbeat that is irregular or faster than normal (palpitations).  Headache.  Light-headedness, especially when you stand up from a sitting position.  Fainting (syncope). Symptoms of severe dehydration may include:   Changes in skin, such as:  Cold and clammy skin.  Blotchy (mottled) or pale skin.  Skin that does  not quickly return to normal after being lightly pinched and released (poor skin turgor).  Changes in body fluids, such as:  Extreme thirst.  No tear production.  Inability to sweat when body temperature is high, such as in hot weather.  Very little urine production.  Changes in vital signs, such as:  Weak pulse.  Pulse that is more than 100 beats a minute when sitting still.  Rapid breathing.  Low blood pressure.  Other changes, such as:  Sunken eyes.  Cold hands and feet.  Confusion.  Lack of energy (lethargy).  Difficulty waking up from sleep.  Short-term weight loss.  Unconsciousness. How is this diagnosed? This condition is diagnosed based on your symptoms and a physical exam. Blood and urine tests may be done to help confirm the diagnosis. How is this treated? Treatment for this condition depends on the severity. Mild or moderate dehydration can often be treated at home. Treatment should be started right away. Do not wait until dehydration becomes severe. Severe dehydration is an emergency and it needs to be treated in a hospital. Treatment for mild dehydration may include:   Drinking more fluids.  Replacing salts and minerals in your blood (electrolytes) that you may have lost. Treatment for moderate dehydration may include:   Drinking an oral rehydration solution (ORS). This is a drink that helps you replace fluids and electrolytes (rehydrate). It can be found at pharmacies and retail stores. Treatment for severe dehydration may include:   Receiving fluids through an IV tube.  Receiving an electrolyte solution through a feeding tube that is   passed through your nose and into your stomach (nasogastric tube, or NG tube).  Correcting any abnormalities in electrolytes.  Treating the underlying cause of dehydration. Follow these instructions at home:  If directed by your health care provider, drink an ORS:  Make an ORS by following instructions on the  package.  Start by drinking small amounts, about  cup (120 mL) every 5-10 minutes.  Slowly increase how much you drink until you have taken the amount recommended by your health care provider.  Drink enough clear fluid to keep your urine clear or pale yellow. If you were told to drink an ORS, finish the ORS first, then start slowly drinking other clear fluids. Drink fluids such as:  Water. Do not drink only water. Doing that can lead to having too little salt (sodium) in the body (hyponatremia).  Ice chips.  Fruit juice that you have added water to (diluted fruit juice).  Low-calorie sports drinks.  Avoid:  Alcohol.  Drinks that contain a lot of sugar. These include high-calorie sports drinks, fruit juice that is not diluted, and soda.  Caffeine.  Foods that are greasy or contain a lot of fat or sugar.  Take over-the-counter and prescription medicines only as told by your health care provider.  Do not take sodium tablets. This can lead to having too much sodium in the body (hypernatremia).  Eat foods that contain a healthy balance of electrolytes, such as bananas, oranges, potatoes, tomatoes, and spinach.  Keep all follow-up visits as told by your health care provider. This is important. Contact a health care provider if:  You have abdominal pain that:  Gets worse.  Stays in one area (localizes).  You have a rash.  You have a stiff neck.  You are more irritable than usual.  You are sleepier or more difficult to wake up than usual.  You feel weak or dizzy.  You feel very thirsty.  You have urinated only a small amount of very dark urine over 6-8 hours. Get help right away if:  You have symptoms of severe dehydration.  You cannot drink fluids without vomiting.  Your symptoms get worse with treatment.  You have a fever.  You have a severe headache.  You have vomiting or diarrhea that:  Gets worse.  Does not go away.  You have blood or green matter  (bile) in your vomit.  You have blood in your stool. This may cause stool to look black and tarry.  You have not urinated in 6-8 hours.  You faint.  Your heart rate while sitting still is over 100 beats a minute.  You have trouble breathing. This information is not intended to replace advice given to you by your health care provider. Make sure you discuss any questions you have with your health care provider. Document Released: 06/27/2005 Document Revised: 01/22/2016 Document Reviewed: 08/21/2015 Elsevier Interactive Patient Education  2017 [employer]  

## 2016-05-26 LAB — EPIDERMAL GROWTH FACTOR RECEPTOR (EGFR) MUTATION ANALYSIS

## 2016-05-27 ENCOUNTER — Ambulatory Visit (HOSPITAL_BASED_OUTPATIENT_CLINIC_OR_DEPARTMENT_OTHER): Payer: Medicare Other | Admitting: Internal Medicine

## 2016-05-27 ENCOUNTER — Other Ambulatory Visit (HOSPITAL_BASED_OUTPATIENT_CLINIC_OR_DEPARTMENT_OTHER): Payer: Medicare Other

## 2016-05-27 ENCOUNTER — Telehealth: Payer: Self-pay | Admitting: *Deleted

## 2016-05-27 ENCOUNTER — Telehealth: Payer: Self-pay | Admitting: Internal Medicine

## 2016-05-27 ENCOUNTER — Ambulatory Visit: Payer: Medicare Other | Admitting: Radiation Oncology

## 2016-05-27 ENCOUNTER — Encounter: Payer: Self-pay | Admitting: Internal Medicine

## 2016-05-27 ENCOUNTER — Ambulatory Visit
Admission: RE | Admit: 2016-05-27 | Discharge: 2016-05-27 | Disposition: A | Discharge status: Home / Self Care | Payer: Medicare Other | Source: Ambulatory Visit | Attending: Radiation Oncology | Admitting: Radiation Oncology

## 2016-05-27 DIAGNOSIS — C9000 Multiple myeloma not having achieved remission: Secondary | ICD-10-CM

## 2016-05-27 DIAGNOSIS — C3491 Malignant neoplasm of unspecified part of right bronchus or lung: Secondary | ICD-10-CM | POA: Diagnosis not present

## 2016-05-27 DIAGNOSIS — Z79899 Other long term (current) drug therapy: Secondary | ICD-10-CM | POA: Diagnosis not present

## 2016-05-27 DIAGNOSIS — Z86711 Personal history of pulmonary embolism: Secondary | ICD-10-CM

## 2016-05-27 DIAGNOSIS — Z8249 Family history of ischemic heart disease and other diseases of the circulatory system: Secondary | ICD-10-CM | POA: Diagnosis not present

## 2016-05-27 DIAGNOSIS — G629 Polyneuropathy, unspecified: Secondary | ICD-10-CM | POA: Diagnosis not present

## 2016-05-27 DIAGNOSIS — Z7901 Long term (current) use of anticoagulants: Secondary | ICD-10-CM

## 2016-05-27 DIAGNOSIS — C7931 Secondary malignant neoplasm of brain: Secondary | ICD-10-CM

## 2016-05-27 DIAGNOSIS — Z823 Family history of stroke: Secondary | ICD-10-CM | POA: Diagnosis not present

## 2016-05-27 DIAGNOSIS — Z85820 Personal history of malignant melanoma of skin: Secondary | ICD-10-CM | POA: Diagnosis not present

## 2016-05-27 DIAGNOSIS — G4700 Insomnia, unspecified: Secondary | ICD-10-CM

## 2016-05-27 DIAGNOSIS — Z85858 Personal history of malignant neoplasm of other endocrine glands: Secondary | ICD-10-CM | POA: Diagnosis not present

## 2016-05-27 DIAGNOSIS — R590 Localized enlarged lymph nodes: Secondary | ICD-10-CM

## 2016-05-27 DIAGNOSIS — Z510 Encounter for antineoplastic radiation therapy: Secondary | ICD-10-CM | POA: Diagnosis not present

## 2016-05-27 DIAGNOSIS — Z9889 Other specified postprocedural states: Secondary | ICD-10-CM | POA: Diagnosis not present

## 2016-05-27 DIAGNOSIS — Z87891 Personal history of nicotine dependence: Secondary | ICD-10-CM | POA: Diagnosis not present

## 2016-05-27 DIAGNOSIS — M549 Dorsalgia, unspecified: Secondary | ICD-10-CM

## 2016-05-27 DIAGNOSIS — Z8042 Family history of malignant neoplasm of prostate: Secondary | ICD-10-CM | POA: Diagnosis not present

## 2016-05-27 DIAGNOSIS — Z833 Family history of diabetes mellitus: Secondary | ICD-10-CM | POA: Diagnosis not present

## 2016-05-27 HISTORY — DX: Malignant neoplasm of unspecified part of right bronchus or lung: C34.91

## 2016-05-27 NOTE — Progress Notes (Signed)
START ON PATHWAY REGIMEN - Non-Small Cell Lung  [id number]: Pembrolizumab 200 mg q21 Days Until Disease Progression, Unacceptable Toxicity, or up to 24 Months   A cycle is 21 days:     Pembrolizumab [facility])) 200 mg flat dose in 50 mL NS IV over 30 minutes every 21 days.  Inline filter required (low-protein binding) Dose Mod: None Additional Orders: Severe immune-mediated reactions can occur. See prescribing information for more details and required immediate management with steroids. Monitor thyroid, renal, liver function tests, glucose, and sodium at baseline and before each  dose of pembrolizumab. Ref: Keytruda(R) (pembrolizumab) prescribing information, 2016.  **Always confirm dose/schedule in your pharmacy ordering system**    Patient Characteristics: Stage IV Metastatic, Non Squamous, Initial Chemotherapy/Immunotherapy, PS = 0, 1, PD-L1 Expression Positive  >= 50% (TPS) Check here if patient was staged using an edition prior to AJCC Staging - 8th Edition (i.e., prior to July 11, 2016)? false AJCC T Category: T1b Current Disease Status: Distant Metastases AJCC N Category: N2 AJCC M Category: M1c AJCC 8 Stage Grouping: IVB Histology: Non Squamous Cell ROS1 Rearrangement Status: Awaiting Test Results [id number] Mutation Status: Not Applicable - EGFR Mutation Negative/Unknown Other Mutations/Biomarkers: No Other Actionable Mutations PD-L1 Expression Status: PD-L1 Positive >= 50% (TPS) Chemotherapy/Immunotherapy LOT: Initial Chemotherapy/Immunotherapy Molecular Targeted Therapy: Not Appropriate ALK Translocation Status: Awaiting Test Results Would you be surprised if this patient died  in the next year? I would NOT be surprised if this patient died in the next year EGFR Mutation Status: Awaiting Test Results BRAF V600E Mutation Status: Awaiting Test Results Performance Status: PS = 0, 1  Intent of Therapy: Non-Curative / Palliative Intent, Discussed with Patient

## 2016-05-27 NOTE — Telephone Encounter (Signed)
Per LOS I have scheduled appts and notified the scheduler 

## 2016-05-27 NOTE — Telephone Encounter (Signed)
Message sent to chemo scheduler to be added per 05/27/16 los. Appointments scheduled, per 05/27/16 los.Appointments confirmed with patient. 05/27/16

## 2016-05-27 NOTE — Progress Notes (Signed)
[place] Telephone:([phone]) [phone]   [phone]  OFFICE PROGRESS NOTE  [name],[name], MD [address] [place] [address]  DIAGNOSIS:  1) stage IVB (T1b, N2, M1c) non-small cell lung cancer, adenocarcinoma with positive PDL1 expression (80%) presented with right hilar lesion in addition to mediastinal lymphadenopathy and multiple metastatic brain lesions diagnosed in October 2017. Molecular studies are still pending but blood test for EGFR mutation is negative  2) Stage IIA multiple myeloma diagnosed in July of 2013.   PRIOR THERAPY:  1) Systemic chemotherapy with subcutaneous Velcade 1.3 mg/M2 on days 1, 4, 8 and 11 every 3 weeks in addition to Revlimid 25 mg by mouth daily for 14 days every 3 weeks and dexamethasone 40 mg on a weekly basis. Status post 4 planned cycles.  2) Systemic chemotherapy with subcutaneous Velcade 1.3 mg/M2 on days 1, 4, 8 and 11 every 3 weeks in addition to Revlimid 25 mg by mouth daily for 14 days every 3 weeks and dexamethasone 40 mg on a weekly basis. 4 more cycles are planned and he is status post 4 cycles of the second set of 4 cycles, now status post a total of 10 cycles. 3) Systemic chemotherapy with Carfilzomib 36 mg/M2 on days 1, 2, 8, 9, 15 and 16 in addition to cyclophosphamide 300 mg/M2 IV on days 1, 8 and 15 as well as dexamethasone on days 1, 8, 15 and 22 every 4 weeks. Status post 4 cycles. 4) Zometa 4 mg IV every month. 5) autologous peripheral blood stem cell transplant on 03/20/2013 at [facility]. 6) Consolidation chemotherapy with Carfilzomib 36 mg/M2 on days 1, 2, 8, 9, 15 and 16 every 4 weeks in addition to cyclophosphamide 300 mg/M2 on days 1, 8 and 15 as well as dexamethasone 40 mg on a weekly basis every 4 weeks. Status post 2 cycles. First cycle 06/11/2013. 7) Maintenance therapy with Revlimid 10 mg by mouth daily status post 3 months of treatment. 8) palliative radiotherapy to the lytic lesion in  the skull under the care of Dr. Sondra Come 9) Maintenance therapy with Revlimid 15 mg by mouth daily started 11/09/2013 and discontinued 02/18/2015 secondary to persistent neutropenia. 10) Maintenance therapy with Revlimid 10 mg by mouth daily started 02/19/2015. currently on hold.  11) stereotactic radiotherapy to multiple brain lesions under the care of Dr. Lisbeth Renshaw.   CURRENT THERAPY:  1) first-line treatment with immunotherapy with Ketruda (pembrolizumab) 200 mg IV every 3 weeks. First dose 06/07/2016.  2) Zometa 4 mg IV every 3 months. 3) Xarelto 20 mg by mouth daily for pulmonary embolism.   INTERVAL HISTORY: [name] 75 y.o. male returns to the clinic today for routine monthly followup visit accompanied by his wife. The patient is feeling much better today. Over the last 7-10 days he has been complaining of several episodes of nausea and vomiting as well as dehydration and headache. He is currently on Decadron 4 mg by mouth twice a day. He underwent stereotactic radiotherapy to multiple brain lesions and distended have few more lesions for treatment next week. The patient denied having any current fever or chills. He has no nausea or vomiting. He denied having any significant chest pain, shortness of breath, cough or hemoptysis. He has no significant weight loss or night sweats. He continues to have occasional headache. The patient underwent bronchoscopy with endobronchial ultrasound and biopsies and the final surgery was consistent with adenocarcinoma of lung primary. The tissue block was  sent for molecular studies as well as PDL 1 expression. The molecular studies are still pending but PDL 1 expression was 80%. He is here today for evaluation and discussion of his treatment options.   MEDICAL HISTORY: Past Medical History:  Diagnosis Date  . Adenomatous polyp of colon 1996  . Arthritis    Osteoarthritis right knee, spine, wrist  . Arthritis of hand, right    Right thumb (Dr. Daylene Katayama)    . Back ache    r/o Multiple Myeloma  . Benign prostatic hypertrophy    (Dr. Karsten Ro in past)  . Degenerative disc disease   . Diverticulosis   . DVT (deep venous thrombosis) ([place]) 01/2014   R calf  . Encounter for antineoplastic chemotherapy 10/20/2015  . Erosive esophagitis   . GERD (gastroesophageal reflux disease)   . Hemorrhoids   . Hiatal hernia   . History of chronic prostatitis   . History of melanoma    Back (Dr. Wilhemina Bonito); early melanoma R arm 03/2011  . Multiple myeloma ([place]) 2013   skull lytic lesions--treated with radiation 07/2014  . Pulmonary embolism ([place]) 01/2014  . Radiation 07/21/14-08/05/14   left occipital parietal skull 25 gray  . SCC (squamous cell carcinoma) 08/04/15   right ear;dysplastic nevus with severe atypia-chest    ALLERGIES:  is allergic to tramadol; amoxicillin; and penicillins.  MEDICATIONS:  Current Outpatient Prescriptions  Medication Sig Dispense Refill  . acetaminophen (TYLENOL) 500 MG tablet Take 500 mg by mouth every 6 (six) hours as needed for moderate pain or headache.    Marland Kitchen ascorbic acid (VITAMIN C) 500 MG tablet Take 500 mg by mouth 2 (two) times daily.     . cholecalciferol (VITAMIN D) 1000 UNITS tablet Take 1,000 Units by mouth daily.    Marland Kitchen desmopressin (DDAVP) 0.1 MG tablet Take 1 tablet (0.1 mg total) by mouth at bedtime. 30 tablet 0  . dexamethasone (DECADRON) 4 MG tablet Take 4 mg by mouth every 6 (six) hours.    Marland Kitchen dicyclomine (BENTYL) 10 MG capsule Take 1 capsule (10 mg total) by mouth 3 (three) times daily before meals. 60 capsule 0  . fish oil-omega-3 fatty acids 1000 MG capsule Take 1 g by mouth daily.     . furosemide (LASIX) 20 MG tablet Take 1 tablet (20 mg total) by mouth daily. (Patient taking differently: Take 20 mg by mouth daily as needed for fluid. ) 30 tablet 0  . hydrOXYzine (VISTARIL) 50 MG capsule TK 1 C PO QID PRF NAUSEA  0  . LORazepam (ATIVAN) 0.5 MG tablet Take 1 tab PO Q 6 hours PRN nausea or anxiety 30 tablet 0   . methocarbamol (ROBAXIN) 500 MG tablet TAKE 1 TO 2 TABLETS BY MOUTH EVERY 6 HOURS AS NEEDED FOR MUSCLE SPASMS 60 tablet 0  . mirabegron ER (MYRBETRIQ) 25 MG TB24 tablet Take 1 tablet (25 mg total) by mouth daily. 7 tablet 0  . Multiple Vitamin (MULTIVITAMIN WITH MINERALS) TABS Take 1 tablet by mouth daily.    . ondansetron (ZOFRAN) 8 MG tablet Take 1 tablet (8 mg total) by mouth every 8 (eight) hours as needed for nausea or vomiting. 30 tablet 1  . oxyCODONE (ROXICODONE) 5 MG immediate release tablet Take 1-2 tabs PO Q 6 hours PRN pain. 30 tablet 0  . oxyCODONE-acetaminophen (PERCOCET/ROXICET) 5-325 MG tablet Take 1 tablet by mouth every 4 (four) hours as needed. 30 tablet 0  . pantoprazole (PROTONIX) 40 MG tablet TAKE 1 TABLET BY MOUTH  TWO  TIMES DAILY 180 tablet 2  . Probiotic Product (ALIGN) 4 MG CAPS Take 1 capsule by mouth daily. Reported on 09/10/2015    . prochlorperazine (COMPAZINE) 10 MG tablet Take 1 tablet (10 mg total) by mouth every 6 (six) hours as needed. 30 tablet 1  . temazepam (RESTORIL) 15 MG capsule Take 1 capsule (15 mg total) by mouth at bedtime as needed. 60 capsule 0  . XARELTO 20 MG TABS tablet TAKE 1 TABLET BY MOUTH  DAILY 90 tablet 1  . zolendronic acid (ZOMETA) 4 MG/5ML injection Inject 4 mg into the vein every 3 (three) months.      No current facility-administered medications for this visit.    Facility-Administered Medications Ordered in Other Visits  Medication Dose Route Frequency Provider Last Rate Last Dose  . ondansetron (ZOFRAN) tablet 8 mg  8 mg Oral Once Curt Bears, MD        SURGICAL HISTORY:  Past Surgical History:  Procedure Laterality Date  . APPENDECTOMY    . BONE MARROW TRANSPLANT  03/2013   Stem Cell  . COLONOSCOPY  2009   adenomatous polyp  . ESOPHAGOGASTRODUODENOSCOPY  2009   mild inflammation at esophagogastric junction  . excision of melanoma     back ( x3)  . Great toe surgery     bilateral  . INGUINAL HERNIA REPAIR     left,  x 2  . port placemet  10/2012  . SCALENE NODE BIOPSY Right 05/11/2016   Procedure: BIOPSY RIGHT SCALENE NODE;  Surgeon: Grace Isaac, MD;  Location: [facility];  Service: Thoracic;  Laterality: Right;  . TOTAL KNEE ARTHROPLASTY  06/08/2011   Procedure: TOTAL KNEE ARTHROPLASTY;  Surgeon: Ninetta Lights, MD;  Location: [facility];  Service: Orthopedics;  Laterality: Right;  osteonics  . total knee replaced  2000   Left knee    REVIEW OF SYSTEMS:  Constitutional: positive for fatigue Eyes: negative Ears, nose, mouth, throat, and face: negative Respiratory: negative Cardiovascular: negative Gastrointestinal: positive for nausea Genitourinary:negative Integument/breast: negative Hematologic/lymphatic: negative Musculoskeletal:positive for arthralgias Neurological: positive for headaches Behavioral/Psych: negative Endocrine: negative Allergic/Immunologic: negative   PHYSICAL EXAMINATION: General appearance: alert, cooperative and no distress Head: Normocephalic, without obvious abnormality, atraumatic, Ecchymosis at the right frontal area of the face. Neck: no adenopathy, no JVD, supple, symmetrical, trachea midline and thyroid not enlarged, symmetric, no tenderness/mass/nodules Lymph nodes: Cervical, supraclavicular, and axillary nodes normal. Resp: clear to auscultation bilaterally Back: symmetric, no curvature. ROM normal. No CVA tenderness. Cardio: regular rate and rhythm, S1, S2 normal, no murmur, click, rub or gallop GI: soft, non-tender; bowel sounds normal; no masses,  no organomegaly Extremities: extremities normal, atraumatic, no cyanosis or edema Neurologic: Alert and oriented X 3, normal strength and tone. Normal symmetric reflexes. Normal coordination and gait  ECOG PERFORMANCE STATUS: 1 - Symptomatic but completely ambulatory  Blood pressure 113/80, pulse 72, temperature 97.5 F (36.4 C), temperature source Oral, resp. rate 18, height _0  (1.753 m), weight 208 lb 11.2 oz  (94.7 kg), SpO2 100 %.  LABORATORY DATA: Lab Results  Component Value Date   WBC 6.1 05/25/2016   HGB 13.3 05/25/2016   HCT 38.3 (L) 05/25/2016   MCV 92.7 05/25/2016   PLT 174 05/25/2016      Chemistry      Component Value Date/Time   NA 137 05/25/2016 1457   K 3.6 05/25/2016 1457   CL 115 (H) 05/09/2016 0828   CL 109 (H) 12/31/2012 0759  CO2 25 05/25/2016 1457   BUN 10.7 05/25/2016 1457   CREATININE 0.9 05/25/2016 1457      Component Value Date/Time   CALCIUM 9.6 05/25/2016 1457   ALKPHOS 48 05/09/2016 0828   ALKPHOS 51 03/15/2016 0912   AST 23 05/09/2016 0828   AST 19 03/15/2016 0912   ALT 37 05/09/2016 0828   ALT 18 03/15/2016 0912   BILITOT 1.1 05/09/2016 0828   BILITOT 0.97 03/15/2016 0912      RADIOGRAPHIC STUDIES: Dg Chest 2 View  Result Date: 05/09/2016 CLINICAL DATA:  Preop chest x-ray EXAM: CHEST  2 VIEW COMPARISON:  05/05/2016 FINDINGS: Normal heart size and stable aortic tortuosity. Thoracic adenopathy on previous CT are subtle. Porta catheter on the right with tip at the SVC level. There is no edema, consolidation, effusion, or pneumothorax. Spondylosis. No acute osseous finding. IMPRESSION: No active cardiopulmonary disease. Electronically Signed   By: Monte Fantasia M.D.   On: 05/09/2016 08:43   Ct Chest W Contrast  Result Date: 04/28/2016 CLINICAL DATA:  History of multiple myeloma. Newly diagnosed brain metastases. EXAM: CT CHEST, ABDOMEN, AND PELVIS WITH CONTRAST TECHNIQUE: Multidetector CT imaging of the chest, abdomen and pelvis was performed following the standard protocol during bolus administration of intravenous contrast. CONTRAST:  22m ISOVUE-300 IOPAMIDOL (ISOVUE-300) INJECTION 61%, 1016mISOVUE-300 IOPAMIDOL (ISOVUE-300) INJECTION 61% COMPARISON:  02/07/2014 and abdomen and pelvis from 04/28/2014. FINDINGS: CT CHEST FINDINGS Cardiovascular: The heart size appears normal. There is no pericardial effusion. Mediastinum/Nodes: The trachea appears  patent and is midline. Normal appearance of the esophagus. Right paratracheal lymph node measures 1.2 cm, image number 23 of series 2. There is a right hilar node which measures 1.8 cm, image 29 of series 2. Previously 0.7 cm. Sub- carinal lymph node measures 1.5 cm, image 30 of series 2. Previously 0.7 cm. Right hilar lymph node measures 1.7 cm, image 35 of series 2. Previously 1.0 cm. Lungs/Pleura: No pleural effusion. Small nodule in the left upper lobe is new from previous exam measuring 5 mm, image number 26 of series 4. Musculoskeletal: Spondylosis is identified within the thoracic spine. The bones appear osteopenic. CT ABDOMEN PELVIS FINDINGS Hepatobiliary: No focal liver abnormality is seen. No gallstones, gallbladder wall thickening, or biliary dilatation.The gallbladder is normal. No biliary dilatation Pancreas: Unremarkable. No pancreatic ductal dilatation or surrounding inflammatory changes. Spleen: Normal in size without focal abnormality. Adrenals/Urinary Tract: The adrenal glands are normal. There is a normal appearance of the right kidney. Scarring involving the upper pole the left kidney noted. The urinary bladder appears normal. Stomach/Bowel: The stomach is normal. The small bowel loops have a normal course and caliber. No pathologic dilatation of the colon. Vascular/Lymphatic: Calcified atherosclerotic disease involves the abdominal aorta. No aneurysm. No enlarged upper abdominal lymph nodes identified. No pelvic or inguinal adenopathy identified. Reproductive: Prostate is unremarkable. Other: No abdominal wall hernia or abnormality. No abdominopelvic ascites. Musculoskeletal: The bones appear diffusely osteopenic. There is degenerative disc disease and scoliosis within the lumbar spine. IMPRESSION: 1. Interval development of mediastinal and hilar adenopathy. Findings may reflect primary bronchogenic carcinoma, metastatic adenopathy or lymphoproliferative disorder. In light of the patient's new  brain metastasis primary bronchogenic carcinoma is favored. Suggest further workup with PET-CT and tissue sampling. Lymph nodes should be easily amendable to biopsy via bronchoscopy. 2. Aortic atherosclerosis. Electronically Signed   By: TaKerby Moors.D.   On: 04/28/2016 16:27   Mr BrJeri CosoGYontrast  Result Date: 05/12/2016 CLINICAL DATA:  [age]ear old male with history  of multiple myeloma. Abnormal pituitary gland. Lung cancer post radiation 2015. Intracranial metastatic disease. Subsequent encounter. EXAM: MRI HEAD WITHOUT AND WITH CONTRAST TECHNIQUE: Multiplanar, multiecho pulse sequences of the brain and surrounding structures were obtained without and with intravenous contrast. CONTRAST:  67m MULTIHANCE GADOBENATE DIMEGLUMINE 529 MG/ML IV SOLN COMPARISON:  04/27/2016 and 07/31/2013 brain MR. 04/27/2016 head CT. FINDINGS: Brain: Enlarged pituitary gland. On dynamic phase imaging, 10 x 7 x 6 mm mid to right aspect pituitary hypodensity noted. Majority of enlargement of the pituitary gland is mid to posterior aspect and there is abnormal enhancement of the infundibulum raising the possibly of metastatic disease. Pituitary adenoma or or non malignant process (lymphocytic apophysitis or inflammatory process such as sarcoidosis) felt to be secondary less likely considerations. Thickened infundibulum just below the optic nerve and optic chiasm without compression. Intracranial enhancing lesions suggesting metastatic disease including (series 21): Image 128, 2 mm lesion right frontal lobe. Image 103, questionable 1 mm left parietal lesion and image 100 questionable left periatrial lesion. Image 101, 8.6 mm frontal opercular lesion. Decrease in surrounding vasogenic edema. Image 92, questionable tiny left frontal opercular lesion. Image 87, 2.5 mm left parietal-occipital lesion. Image 78, medial left occipital gyriform enhancement suggestive of leptomeningeal spread of tumor. Image 75, 6 mm medial right  occipital lesion. Image 73, 3.4 mm right posterior temporal lesion. Image 40, 1.5 cm superior right cerebellar lesion with mild surrounding vasogenic edema which has improved from the prior exam. Image 30, 4 mm right cerebellar lesion and Image 25, 3 mm left cerebellar lesion. Nonspecific restricted motion medial aspect posterior left hippocampus without other findings to suggest acute infarct or encephalitis. In proper clinical setting this could reflect changes of seizure activity. Global atrophy without hydrocephalus. Chronic microvascular changes. Vascular: Major intracranial vascular structures are patent. Skull and upper cervical spine: Erosion of the dens. Fusion C2-3. Spinal stenosis C3-4 incompletely assessed. Sinuses/Orbits: Post lens replacement without acute orbital abnormality. Polypoid opacification the left maxillary sinus. Minimal mucosal thickening ethmoid sinus air cells. Other: Slightly heterogeneous calvarium may reflect changes of myeloma. IMPRESSION: Several intracranial metastatic lesion suspected as detailed above. 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 Decrease surrounding vasogenic edema may represent result of treatment with steroids since recent MR. Abnormal appearance of the pituitary gland which in the present clinical setting may represent result of metastatic disease. Other considerations felt to be secondary. Nonspecific restricted motion medial aspect posterior left hippocampus without other findings to suggest acute infarct or encephalitis. In proper clinical setting this could reflect changes of seizure activity. Electronically Signed   By: SGenia DelM.D.   On: 05/12/2016 14:34   Mr BJeri CosAnd Wo Contrast  Result Date: 04/27/2016 CLINICAL DATA:  History of multiple myeloma. Dizziness. Abnormal CT head EXAM: MRI HEAD WITHOUT AND WITH CONTRAST TECHNIQUE: Multiplanar, multiecho pulse sequences of the brain and surrounding structures were  obtained without and with intravenous contrast. CONTRAST:  182mMULTIHANCE GADOBENATE DIMEGLUMINE 529 MG/ML IV SOLN COMPARISON:  CT head 04/27/2016, MRI 07/31/2013 FINDINGS: Brain: Multiple enhancing lesions are present in the brain compatible with metastatic disease. The largest lesion in the right superior cerebellum measures 16 x 12 mm with surrounding edema. Right parietal operculum lesion measures 8 x 9 mm. Right occipital lobe lesion measures 5 mm Small bilateral cerebellar enhancing lesions 2 mm each. Abnormal pituitary. Pituitary is enlarged and shows heterogeneous enhancement. There is also thickening and enhancement of the infundibulum diffusely. These findings were not present on the prior  MRI 07/31/2013. Currently the pituitary measures 10 mm and height. Cavernous sinus normal. No compression of the optic chiasm. Generalized atrophy. Negative for hydrocephalus. Negative for acute infarct. No significant chronic ischemic change. No intracranial hemorrhage. Vascular: Normal arterial flow void. Skull and upper cervical spine: Negative Sinuses/Orbits: Mucosal edema in the paranasal sinuses. No orbital mass. Bilateral lens replacement. Other: None IMPRESSION: Multiple enhancing mass lesions in the brain most consistent with metastatic disease. Mild edema in the right cerebellum and right parietal operculum lesion. No shift of the midline structures. Abnormal pituitary which shows heterogeneous enhancement and is mildly enlarged measuring 10 mm. Thickening and enhancement of the infundibulum. Differential includes metastatic disease to the pituitary. Other possibilities include lymphocytic hypophysitis, sarcoid, pituitary adenoma. Electronically Signed   By: Franchot Gallo M.D.   On: 04/27/2016 15:27   Ct Abdomen Pelvis W Contrast  Result Date: 04/28/2016 CLINICAL DATA:  History of multiple myeloma. Newly diagnosed brain metastases. EXAM: CT CHEST, ABDOMEN, AND PELVIS WITH CONTRAST TECHNIQUE:  Multidetector CT imaging of the chest, abdomen and pelvis was performed following the standard protocol during bolus administration of intravenous contrast. CONTRAST:  61m ISOVUE-300 IOPAMIDOL (ISOVUE-300) INJECTION 61%, 1054mISOVUE-300 IOPAMIDOL (ISOVUE-300) INJECTION 61% COMPARISON:  02/07/2014 and abdomen and pelvis from 04/28/2014. FINDINGS: CT CHEST FINDINGS Cardiovascular: The heart size appears normal. There is no pericardial effusion. Mediastinum/Nodes: The trachea appears patent and is midline. Normal appearance of the esophagus. Right paratracheal lymph node measures 1.2 cm, image number 23 of series 2. There is a right hilar node which measures 1.8 cm, image 29 of series 2. Previously 0.7 cm. Sub- carinal lymph node measures 1.5 cm, image 30 of series 2. Previously 0.7 cm. Right hilar lymph node measures 1.7 cm, image 35 of series 2. Previously 1.0 cm. Lungs/Pleura: No pleural effusion. Small nodule in the left upper lobe is new from previous exam measuring 5 mm, image number 26 of series 4. Musculoskeletal: Spondylosis is identified within the thoracic spine. The bones appear osteopenic. CT ABDOMEN PELVIS FINDINGS Hepatobiliary: No focal liver abnormality is seen. No gallstones, gallbladder wall thickening, or biliary dilatation.The gallbladder is normal. No biliary dilatation Pancreas: Unremarkable. No pancreatic ductal dilatation or surrounding inflammatory changes. Spleen: Normal in size without focal abnormality. Adrenals/Urinary Tract: The adrenal glands are normal. There is a normal appearance of the right kidney. Scarring involving the upper pole the left kidney noted. The urinary bladder appears normal. Stomach/Bowel: The stomach is normal. The small bowel loops have a normal course and caliber. No pathologic dilatation of the colon. Vascular/Lymphatic: Calcified atherosclerotic disease involves the abdominal aorta. No aneurysm. No enlarged upper abdominal lymph nodes identified. No pelvic or  inguinal adenopathy identified. Reproductive: Prostate is unremarkable. Other: No abdominal wall hernia or abnormality. No abdominopelvic ascites. Musculoskeletal: The bones appear diffusely osteopenic. There is degenerative disc disease and scoliosis within the lumbar spine. IMPRESSION: 1. Interval development of mediastinal and hilar adenopathy. Findings may reflect primary bronchogenic carcinoma, metastatic adenopathy or lymphoproliferative disorder. In light of the patient's new brain metastasis primary bronchogenic carcinoma is favored. Suggest further workup with PET-CT and tissue sampling. Lymph nodes should be easily amendable to biopsy via bronchoscopy. 2. Aortic atherosclerosis. Electronically Signed   By: TaKerby Moors.D.   On: 04/28/2016 16:27   Nm Pet Image Initial (pi) Whole Body  Result Date: 05/05/2016 CLINICAL DATA:  Initial treatment strategy for multiple myeloma. Mediastinal lymphadenopathy. EXAM: NUCLEAR MEDICINE PET WHOLE BODY TECHNIQUE: 11.2 mCi F-18 FDG was injected intravenously. Full-ring PET imaging was  performed from the vertex to the feet after the radiotracer. CT data was obtained and used for attenuation correction and anatomic localization. FASTING BLOOD GLUCOSE:  Value: 103 mg/dl COMPARISON:  04/28/2016 FINDINGS: NECK Hypermetabolic LEFT and RIGHT supraclavicular lymph nodes. Larger lymph nodes on the RIGHT measure up to 13 mm short axis. Lymph nodes are intensely hypermetabolic SUV max equal 9.7. Hypermetabolic paratracheal lymph nodes. For example 13 mm short axis RIGHT lower paratracheal lymph node with SUV max equal 16.6. There are hypermetabolic RIGHT and LEFT lower paratracheal lymph node and subcarinal lymph nodes. Bilateral hypermetabolic hilar lymph nodes with SUV max equal 13.1. No suspicious pulmonary nodularity. There is no subpleural nodularity. No central para bronchovascular thickening. CHEST No hypermetabolic mediastinal or hilar nodes. No suspicious pulmonary  nodules on the CT scan. ABDOMEN/PELVIS No abnormal metabolic activity the liver or spleen. Spleen is normal volume. Normal adrenal glands. Kidneys are normal. SKELETON Hypermetabolic lesion in the L4 vertebral body within the LEFT pedicle with SUV max equal 12.1. There is sclerosis in the pedicles and a linear linear lucency additionally. No additional abnormal uptake within the bones. Extremities: No focal scattered uptake within the LEFT knee associated with the prosthetic. This is likely inflammatory metabolic activity. IMPRESSION: 1. Symmetric mediastinal, hilar and supraclavicular adenopathy which is intensely metabolic for size. In patient brain metastasis lesions are concerning for nodal metastasis. Supraclavicular lymph nodes may be assessable for biopsy. 2. No focal pulmonary lesion identified. 3. Single focus of intense linear metabolic activity within the LEFT pedicle/facet of the L4 vertebral body. There is a linear lucency at this level additionally. Favor remote fracture of this vertebral body rather than myeloma lesion or metastatic lesion. No additional evidence of myeloma or plasmacytoma. 4. Metabolic activity associated with the knee prosthetics, greater on the LEFT is favored inflammatory. Electronically Signed   By: Suzy Bouchard M.D.   On: 05/05/2016 18:27   Dg Abd Acute W/chest  Result Date: 05/25/2016 CLINICAL DATA:  Nausea and vomiting for 2 weeks. History of multiple myeloma. EXAM: DG ABDOMEN ACUTE W/ 1V CHEST COMPARISON:  PA and lateral chest 05/09/2016 09/10/2015. CT chest, abdomen and pelvis 04/28/2016. FINDINGS: Single-view of the chest again shows a Port-A-Cath in place. Subsegmental atelectasis is present in the bases. No consolidative process, pneumothorax or effusion. Heart size is normal. Aortic atherosclerosis is noted. Two views of the abdomen show no free intraperitoneal air. The bowel gas pattern is nonobstructive. Convex right lumbar scoliosis and multilevel spondylosis  are identified. IMPRESSION: No acute abnormality chest or abdomen. Electronically Signed   By: Inge Rise M.D.   On: 05/25/2016 14:53    ASSESSMENT AND PLAN: This is a very pleasant 75 years old white male with   1) history of multiple myeloma status post induction chemotherapy with Velcade, Revlimid and dexamethasone followed by treatment with Carfilzomib, cyclophosphamide and dexamethasone with significant improvement in his disease followed by autologous peripheral blood stem cell transplant at [facility] on 03/20/2013. This was followed by 2 cycles of consolidation chemotherapy with Carfilzomib, Cytoxan and dexamethasone, and he tolerated it fairly well. He completed maintenance Revlimid 10 mg by mouth daily for 3 months and tolerating it fairly well. He started maintenance Revlimid with 15 mg by mouth daily in May 2015 and tolerating it well except for the persistent neutropenia.  He started the reduced dose of Revlimid 10 mg by mouth daily on 02/19/2015. He is tolerating this treatment much better.  The recent myeloma panel showed no evidence for disease  progression. I discussed the lab result with the patient today. I recommended for the patient to discontinue his current treatment with Revlimid for now because of the concern about Revlimid induced second malignancy. 2) stage IV non-small cell lung cancer, adenocarcinoma with multiple brain metastases (PDL1 80%):  Unfortunately the recent biopsy confirmed the diagnosis of primary lung adenocarcinoma. The tissue block was sent to [facility] one for molecular studies and the results are still pending but PDL 1 was 80%. Blood tests for EGFR mutation was negative. I had a lengthy discussion with the patient and his wife today about his current disease stage, prognosis and treatment options. I recommended for the patient to complete the stereotactic radiotherapy to the brain lesions as prescribed by Dr. Lisbeth Renshaw. I discussed with the  patient his systemic treatment options including palliative care versus treatment with immunotherapy with Ketruda (pembrolizumab) as a single agent or in addition to systemic chemotherapy with carboplatin and Alimta. The patient is interested in proceeding with treatment with single agent Ketruda (pembrolizumab) for now. He is expected to start the first dose of this treatment on 06/07/2016. I discussed with the patient adverse effect of this treatment including but not limited to immune mediated skin rash, diarrhea, or inflammation of the lung, liver, kidney in addition to thyroid or other endocrine dysfunction. He'll continue on Decadron 4 mg by mouth twice a day until next week but his does need to be reduced to 2 mg or less when he started his systemic immunotherapy. He will continue treatment with Zometa every 3 months. It is currently on hold for now. For the pulmonary embolism, he will continue on Xarelto 20 mg by mouth daily as scheduled. For insomnia he will continue on Restoril. For the peripheral neuropathy, I started the patient on Neurontin 100 mg by mouth 3 times a day. For the back pain, he will continue on Percocet. I will see the patient back for follow-up visit in 4 weeks with the start of cycle #2.Marland Kitchen He was advised to call immediately if he has any concerning symptoms in the interval. The patient voices understanding of current disease status and treatment options and is in agreement with the current care plan.  All questions were answered. The patient knows to call the clinic with any problems, questions or concerns. We can certainly see the patient much sooner if necessary.  Disclaimer: This note was dictated with voice recognition software. Similar sounding words can inadvertently be transcribed and may not be corrected upon review.

## 2016-05-29 NOTE — Progress Notes (Signed)
  Radiation Oncology         [phone]) [phone] ________________________________  Name: [name] MRN: [id number]  Date: 05/25/2016  DOB: Dec 03, 1940   SPECIAL TREATMENT PROCEDURE   3D TREATMENT PLANNING AND DOSIMETRY: The patient's radiation plan was reviewed and approved by Dr. Saintclair Halsted from neurosurgery and radiation oncology prior to treatment. It showed 3-dimensional radiation distributions overlaid onto the planning CT/MRI image set. The [facility] for the target structures as well as the organs at risk were reviewed. The documentation of the 3D plan and dosimetry are filed in the radiation oncology EMR.   NARRATIVE: The patient was brought to the TrueBeam stereotactic radiation treatment machine and placed supine on the CT couch. The head frame was applied, and the patient was set up for stereotactic radiosurgery. Neurosurgery was present for the set-up and delivery   SIMULATION VERIFICATION: In the couch zero-angle position, the patient underwent Exactrac imaging using the Brainlab system with orthogonal KV images. These were carefully aligned and repeated to confirm treatment position for each of the isocenters. The Exactrac snap film verification was repeated at each couch angle.   SPECIAL TREATMENT PROCEDURE: The patient received stereotactic radiosurgery to the following target:  Pituitary metastasis target was treated using 5 Arcs to a prescription dose of 5 Gy. ExacTrac Snap verification was performed for each couch angle.  This represents fraction #3 out of a total of 5 treatments to the pituitary metastasis  STEREOTACTIC TREATMENT MANAGEMENT: Following delivery, the patient was transported to nursing in stable condition and monitored for possible acute effects. Vital signs were recorded . The patient tolerated treatment without significant acute effects, and was discharged to home in stable condition.  PLAN: The patient will continue with his fourth fraction to this site later this  week..   ------------------------------------------------  [name], MD, PhD

## 2016-05-29 NOTE — Progress Notes (Signed)
  Radiation Oncology         [phone]) [phone] ________________________________  Name: [name] MRN: [id number]  Date: 05/23/2016  DOB: 01-18-1941   SPECIAL TREATMENT PROCEDURE   3D TREATMENT PLANNING AND DOSIMETRY: The patient's radiation plan was reviewed and approved by Dr. Saintclair Halsted from neurosurgery and radiation oncology prior to treatment. It showed 3-dimensional radiation distributions overlaid onto the planning CT/MRI image set. The [facility] for the target structures as well as the organs at risk were reviewed. The documentation of the 3D plan and dosimetry are filed in the radiation oncology EMR.   NARRATIVE: The patient was brought to the TrueBeam stereotactic radiation treatment machine and placed supine on the CT couch. The head frame was applied, and the patient was set up for stereotactic radiosurgery. Neurosurgery was present for the set-up and delivery   SIMULATION VERIFICATION: In the couch zero-angle position, the patient underwent Exactrac imaging using the Brainlab system with orthogonal KV images. These were carefully aligned and repeated to confirm treatment position for each of the isocenters. The Exactrac snap film verification was repeated at each couch angle.   SPECIAL TREATMENT PROCEDURE: The patient received stereotactic radiosurgery to the following target:  Pituitary metastasis target target was treated using 5 Arcs to a prescription dose of 5 Gy. ExacTrac Snap verification was performed for each couch angle.  This was the second of 5 total fractions to the pituitary metastasis.  STEREOTACTIC TREATMENT MANAGEMENT: Following delivery, the patient was transported to nursing in stable condition and monitored for possible acute effects. Vital signs were recorded . The patient tolerated treatment without significant acute effects, and was discharged to home in stable condition.  PLAN: The patient will continue later this week with his third treatment to the pituitary  metastasis   ------------------------------------------------  [name], MD, PhD

## 2016-05-29 NOTE — Progress Notes (Signed)
  Radiation Oncology         [phone]) [phone] ________________________________  Name: [name] MRN: [id number]  Date: 05/27/2016  DOB: 1940/09/01   SPECIAL TREATMENT PROCEDURE   3D TREATMENT PLANNING AND DOSIMETRY: The patient's radiation plan was reviewed and approved by Dr. Saintclair Halsted from neurosurgery and radiation oncology prior to treatment. It showed 3-dimensional radiation distributions overlaid onto the planning CT/MRI image set. The [facility] for the target structures as well as the organs at risk were reviewed. The documentation of the 3D plan and dosimetry are filed in the radiation oncology EMR.   NARRATIVE: The patient was brought to the TrueBeam stereotactic radiation treatment machine and placed supine on the CT couch. The head frame was applied, and the patient was set up for stereotactic radiosurgery. Neurosurgery was present for the set-up and delivery   SIMULATION VERIFICATION: In the couch zero-angle position, the patient underwent Exactrac imaging using the Brainlab system with orthogonal KV images. These were carefully aligned and repeated to confirm treatment position for each of the isocenters. The Exactrac snap film verification was repeated at each couch angle.   SPECIAL TREATMENT PROCEDURE: The patient received stereotactic radiosurgery to the following target:  Pituitary metastasis target was treated using 5 Arcs to a prescription dose of 5 Gy. ExacTrac Snap verification was performed for each couch angle.   STEREOTACTIC TREATMENT MANAGEMENT: Following delivery, the patient was transported to nursing in stable condition and monitored for possible acute effects. Vital signs were recorded . The patient tolerated treatment without significant acute effects, and was discharged to home in stable condition.  PLAN: The patient will continue with his final fraction early next week. He will then follow-up in our clinic in 1  month.   ------------------------------------------------  [name], MD, PhD

## 2016-05-30 ENCOUNTER — Encounter: Payer: Self-pay | Admitting: Radiation Oncology

## 2016-05-30 ENCOUNTER — Ambulatory Visit
Admission: RE | Admit: 2016-05-30 | Discharge: 2016-05-30 | Disposition: A | Discharge status: Home / Self Care | Payer: Medicare Other | Source: Ambulatory Visit | Attending: Radiation Oncology | Admitting: Radiation Oncology

## 2016-05-30 ENCOUNTER — Other Ambulatory Visit: Payer: Self-pay | Admitting: Radiation Oncology

## 2016-05-30 VITALS — BP 106/79 | HR 78 | Temp 97.6°F | Resp 20

## 2016-05-30 DIAGNOSIS — B370 Candidal stomatitis: Secondary | ICD-10-CM

## 2016-05-30 DIAGNOSIS — C7931 Secondary malignant neoplasm of brain: Secondary | ICD-10-CM | POA: Diagnosis not present

## 2016-05-30 DIAGNOSIS — Z510 Encounter for antineoplastic radiation therapy: Secondary | ICD-10-CM | POA: Diagnosis not present

## 2016-05-30 DIAGNOSIS — Z87891 Personal history of nicotine dependence: Secondary | ICD-10-CM | POA: Diagnosis not present

## 2016-05-30 DIAGNOSIS — Z823 Family history of stroke: Secondary | ICD-10-CM | POA: Diagnosis not present

## 2016-05-30 DIAGNOSIS — Z85858 Personal history of malignant neoplasm of other endocrine glands: Secondary | ICD-10-CM | POA: Diagnosis not present

## 2016-05-30 DIAGNOSIS — Z8042 Family history of malignant neoplasm of prostate: Secondary | ICD-10-CM | POA: Diagnosis not present

## 2016-05-30 DIAGNOSIS — Z85820 Personal history of malignant melanoma of skin: Secondary | ICD-10-CM | POA: Diagnosis not present

## 2016-05-30 DIAGNOSIS — Z833 Family history of diabetes mellitus: Secondary | ICD-10-CM | POA: Diagnosis not present

## 2016-05-30 DIAGNOSIS — Z9889 Other specified postprocedural states: Secondary | ICD-10-CM | POA: Diagnosis not present

## 2016-05-30 DIAGNOSIS — C3491 Malignant neoplasm of unspecified part of right bronchus or lung: Secondary | ICD-10-CM | POA: Diagnosis not present

## 2016-05-30 DIAGNOSIS — Z79899 Other long term (current) drug therapy: Secondary | ICD-10-CM | POA: Diagnosis not present

## 2016-05-30 DIAGNOSIS — Z8249 Family history of ischemic heart disease and other diseases of the circulatory system: Secondary | ICD-10-CM | POA: Diagnosis not present

## 2016-05-30 MED ORDER — FIRST-DUKES MOUTHWASH MT SUSP: OROMUCOSAL | 0 refills | Status: DC

## 2016-05-30 NOTE — Progress Notes (Addendum)
S/p SRS brain 5/5, completed, still has nausea,  took compazine 1 hour ago ,stated he self induced 2x last night, stated he stuck his  Finger down his throat to induce vomiting,  stomach rumbling and felt bad, takes, zofran, then compazine , then ativan alternately for nuasea, no blurred vision, no pain, or dizzy ness, does have thrush on tongue, no appetite, monitor 15 minutes, no driving,  Today, , informed Shona Simpson, PA,  3:49 PM BP 106/79 (BP Location: Right Arm, Patient Position: Sitting, Cuff Size: Normal)   Pulse 78   Temp 97.6 F (36.4 C) (Oral)   Resp 20   SpO2 97% Comment: room air

## 2016-05-30 NOTE — Progress Notes (Signed)
  Radiation Oncology         [phone]) [phone] ________________________________  Name: [name] MRN: [id number]  Date: 05/30/2016  DOB: Oct 20, 1940   SPECIAL TREATMENT PROCEDURE   3D TREATMENT PLANNING AND DOSIMETRY: The patient's radiation plan was reviewed and approved by Dr. Saintclair Halsted from neurosurgery and radiation oncology prior to initial treatment. It showed 3-dimensional radiation distributions overlaid onto the planning CT/MRI image set. The [facility] for the target structures as well as the organs at risk were reviewed. The documentation of the 3D plan and dosimetry are filed in the radiation oncology EMR.   NARRATIVE: The patient was brought to the TrueBeam stereotactic radiation treatment machine and placed supine on the CT couch. The head frame was applied, and the patient was set up for stereotactic radiosurgery. Neurosurgery was present for the set-up and delivery   SIMULATION VERIFICATION: In the couch zero-angle position, the patient underwent Exactrac imaging using the Brainlab system with orthogonal KV images. These were carefully aligned and repeated to confirm treatment position for each of the isocenters. The Exactrac snap film verification was repeated at each couch angle.   SPECIAL TREATMENT PROCEDURE: The patient received stereotactic radiosurgery to the following target:  Pituitary metastasis target was treated using 5 Arcs to a prescription dose of 5 Gy. ExacTrac Snap verification was performed for each couch angle.   STEREOTACTIC TREATMENT MANAGEMENT: Following delivery, the patient was transported to nursing in stable condition and monitored for possible acute effects. Vital signs were recorded . The patient tolerated treatment without significant acute effects, and was discharged to home in stable condition.  PLAN: The patient will follow-up in our clinic in with Shona Simpson PA 1 month.  -----------------------------------  Eppie Gibson, MD

## 2016-05-31 ENCOUNTER — Other Ambulatory Visit: Payer: Self-pay | Admitting: Radiation Oncology

## 2016-05-31 ENCOUNTER — Telehealth: Payer: Self-pay | Admitting: Radiation Therapy

## 2016-05-31 DIAGNOSIS — B370 Candidal stomatitis: Secondary | ICD-10-CM

## 2016-05-31 DIAGNOSIS — H53432 Sector or arcuate defects, left eye: Secondary | ICD-10-CM | POA: Diagnosis not present

## 2016-05-31 MED ORDER — NYSTATIN 100000 UNIT/ML MT SUSP
5.0000 mL | Freq: Four times a day (QID) | OROMUCOSAL | 1 refills | Status: DC
Start: 2016-05-31 — End: 2016-06-13

## 2016-05-31 NOTE — Telephone Encounter (Signed)
[name] is scheduled to see Dr. Buddy Duty Monday 11/27 @ 1:20. This is to evaluate his ongoing symptoms of nausea, vomiting and diarrhea. If Dr. Buddy Duty does not feel that these symptoms could be caused by his pituitary met, then he is to see his GI doctor, Dr. Fuller Plan for evaluation. [name] is comfortable with this plan and will continue his steroid dose of 2 mg BID until after further recommendations from Dr. Buddy Duty on Monday.   Mont Dutton R.T.(R)(T) Special Procedures Navigator

## 2016-06-01 ENCOUNTER — Other Ambulatory Visit: Payer: Self-pay | Admitting: Family Medicine

## 2016-06-01 ENCOUNTER — Encounter (HOSPITAL_COMMUNITY): Payer: Self-pay

## 2016-06-01 ENCOUNTER — Telehealth: Payer: Self-pay | Admitting: *Deleted

## 2016-06-01 LAB — EPIDERMAL GROWTH FACTOR RECEPTOR (EGFR) MUTATION ANALYSIS

## 2016-06-01 MED ORDER — DICYCLOMINE HCL 10 MG PO CAPS: 10.0000 mg | ORAL_CAPSULE | Freq: Three times a day (TID) | ORAL | 0 refills | Status: DC

## 2016-06-01 NOTE — Telephone Encounter (Signed)
Oncology Nurse Navigator Documentation  Oncology Nurse Navigator Flowsheets 06/01/2016  Navigator Location [facility]  Navigator Encounter Type Treatment/I called [name] today to see how he was feeling.  He states he is feeling better.  He sounded strong on the phone.  I listened as he explained how he was feeling.  I offered support.    Treatment Phase Treatment  Barriers/Navigation Needs (No Data)/support  Acuity Level 1  Time Spent with Patient 15

## 2016-06-03 ENCOUNTER — Telehealth: Payer: Self-pay | Admitting: Medical Oncology

## 2016-06-03 NOTE — Telephone Encounter (Signed)
I returned pt call. He is having "irritable bowel syndrome symptoms of bloating and diarrhea" . Over the years it flares up and he takes Dicyclomine and he is has been taking it for the last 3 days. Please lt [name] know because I am scheduled for [facility] Tuesday.

## 2016-06-05 ENCOUNTER — Encounter (HOSPITAL_COMMUNITY): Payer: Self-pay | Admitting: Emergency Medicine

## 2016-06-05 ENCOUNTER — Emergency Department (HOSPITAL_COMMUNITY): Payer: Medicare Other

## 2016-06-05 ENCOUNTER — Inpatient Hospital Stay (HOSPITAL_COMMUNITY)
Admission: EM | Admit: 2016-06-05 | Discharge: 2016-06-13 | DRG: 388 | Disposition: A | Discharge status: Home / Self Care | Payer: Medicare Other | Attending: Internal Medicine | Admitting: Internal Medicine

## 2016-06-05 DIAGNOSIS — N179 Acute kidney failure, unspecified: Secondary | ICD-10-CM | POA: Diagnosis not present

## 2016-06-05 DIAGNOSIS — E860 Dehydration: Secondary | ICD-10-CM | POA: Diagnosis present

## 2016-06-05 DIAGNOSIS — Z86711 Personal history of pulmonary embolism: Secondary | ICD-10-CM | POA: Diagnosis not present

## 2016-06-05 DIAGNOSIS — Z09 Encounter for follow-up examination after completed treatment for conditions other than malignant neoplasm: Secondary | ICD-10-CM

## 2016-06-05 DIAGNOSIS — C7931 Secondary malignant neoplasm of brain: Secondary | ICD-10-CM | POA: Diagnosis present

## 2016-06-05 DIAGNOSIS — Z9049 Acquired absence of other specified parts of digestive tract: Secondary | ICD-10-CM

## 2016-06-05 DIAGNOSIS — K56609 Unspecified intestinal obstruction, unspecified as to partial versus complete obstruction: Secondary | ICD-10-CM | POA: Diagnosis present

## 2016-06-05 DIAGNOSIS — K6389 Other specified diseases of intestine: Secondary | ICD-10-CM | POA: Diagnosis not present

## 2016-06-05 DIAGNOSIS — E232 Diabetes insipidus: Secondary | ICD-10-CM | POA: Diagnosis not present

## 2016-06-05 DIAGNOSIS — R634 Abnormal weight loss: Secondary | ICD-10-CM | POA: Diagnosis present

## 2016-06-05 DIAGNOSIS — C9000 Multiple myeloma not having achieved remission: Secondary | ICD-10-CM | POA: Diagnosis present

## 2016-06-05 DIAGNOSIS — Z880 Allergy status to penicillin: Secondary | ICD-10-CM

## 2016-06-05 DIAGNOSIS — Z85820 Personal history of malignant melanoma of skin: Secondary | ICD-10-CM

## 2016-06-05 DIAGNOSIS — C3491 Malignant neoplasm of unspecified part of right bronchus or lung: Secondary | ICD-10-CM | POA: Diagnosis not present

## 2016-06-05 DIAGNOSIS — Z885 Allergy status to narcotic agent status: Secondary | ICD-10-CM

## 2016-06-05 DIAGNOSIS — E876 Hypokalemia: Secondary | ICD-10-CM | POA: Diagnosis not present

## 2016-06-05 DIAGNOSIS — E871 Hypo-osmolality and hyponatremia: Secondary | ICD-10-CM | POA: Diagnosis not present

## 2016-06-05 DIAGNOSIS — Z8249 Family history of ischemic heart disease and other diseases of the circulatory system: Secondary | ICD-10-CM

## 2016-06-05 DIAGNOSIS — K529 Noninfective gastroenteritis and colitis, unspecified: Secondary | ICD-10-CM

## 2016-06-05 DIAGNOSIS — K5669 Other partial intestinal obstruction: Principal | ICD-10-CM | POA: Diagnosis present

## 2016-06-05 DIAGNOSIS — R933 Abnormal findings on diagnostic imaging of other parts of digestive tract: Secondary | ICD-10-CM

## 2016-06-05 DIAGNOSIS — K2970 Gastritis, unspecified, without bleeding: Secondary | ICD-10-CM | POA: Diagnosis not present

## 2016-06-05 DIAGNOSIS — Z7901 Long term (current) use of anticoagulants: Secondary | ICD-10-CM

## 2016-06-05 DIAGNOSIS — R109 Unspecified abdominal pain: Secondary | ICD-10-CM | POA: Diagnosis not present

## 2016-06-05 DIAGNOSIS — M1711 Unilateral primary osteoarthritis, right knee: Secondary | ICD-10-CM | POA: Diagnosis present

## 2016-06-05 DIAGNOSIS — R112 Nausea with vomiting, unspecified: Secondary | ICD-10-CM | POA: Diagnosis not present

## 2016-06-05 DIAGNOSIS — R338 Other retention of urine: Secondary | ICD-10-CM | POA: Diagnosis not present

## 2016-06-05 DIAGNOSIS — Z9484 Stem cells transplant status: Secondary | ICD-10-CM

## 2016-06-05 DIAGNOSIS — G9341 Metabolic encephalopathy: Secondary | ICD-10-CM

## 2016-06-05 DIAGNOSIS — Z87891 Personal history of nicotine dependence: Secondary | ICD-10-CM

## 2016-06-05 DIAGNOSIS — K219 Gastro-esophageal reflux disease without esophagitis: Secondary | ICD-10-CM | POA: Diagnosis not present

## 2016-06-05 DIAGNOSIS — Z7952 Long term (current) use of systemic steroids: Secondary | ICD-10-CM

## 2016-06-05 DIAGNOSIS — N401 Enlarged prostate with lower urinary tract symptoms: Secondary | ICD-10-CM | POA: Diagnosis present

## 2016-06-05 DIAGNOSIS — Z923 Personal history of irradiation: Secondary | ICD-10-CM | POA: Diagnosis not present

## 2016-06-05 DIAGNOSIS — E870 Hyperosmolality and hypernatremia: Secondary | ICD-10-CM | POA: Diagnosis not present

## 2016-06-05 DIAGNOSIS — Z86718 Personal history of other venous thrombosis and embolism: Secondary | ICD-10-CM | POA: Diagnosis not present

## 2016-06-05 DIAGNOSIS — N411 Chronic prostatitis: Secondary | ICD-10-CM | POA: Diagnosis present

## 2016-06-05 DIAGNOSIS — D696 Thrombocytopenia, unspecified: Secondary | ICD-10-CM | POA: Diagnosis not present

## 2016-06-05 DIAGNOSIS — Z8042 Family history of malignant neoplasm of prostate: Secondary | ICD-10-CM

## 2016-06-05 DIAGNOSIS — Z833 Family history of diabetes mellitus: Secondary | ICD-10-CM

## 2016-06-05 DIAGNOSIS — Z79899 Other long term (current) drug therapy: Secondary | ICD-10-CM

## 2016-06-05 DIAGNOSIS — Z8719 Personal history of other diseases of the digestive system: Secondary | ICD-10-CM

## 2016-06-05 DIAGNOSIS — Z86010 Personal history of colonic polyps: Secondary | ICD-10-CM

## 2016-06-05 DIAGNOSIS — Z823 Family history of stroke: Secondary | ICD-10-CM

## 2016-06-05 NOTE — ED Triage Notes (Signed)
Patient from home, brought in via [place]. Patient C/O n/v/d and abdominal pain x5 days. Patient reported to EMS that tonight felt different. Patient reported feeling dizzy and light headed. No syncopal episode. Patient received 50 of iv fentanyl, 4 mg zofran and 250 ml NS in rout to hospital. Patients being seen at cancer center, last chemo treatment Monday, 05/30/16

## 2016-06-05 NOTE — ED Notes (Signed)
Bed: QQ59 Expected date:  Expected time:  Means of arrival:  Comments: 75 yo M/ [place] pt Abd Pain

## 2016-06-05 NOTE — ED Provider Notes (Signed)
[facility] DEPT Provider Note   CSN: [id number] Arrival date & time: 06/05/16  2310  By signing my name below, I, [name], attest that this documentation has been prepared under the direction and in the presence of Hector Greek, MD. Electronically signed, [name], ED Scribe. 06/06/16. 11:28 PM.    History    The history is provided by the patient and the spouse. No language interpreter was used.  HPI Comments: [name] is a 75 y.o. male who presents to the Emergency Department complaining of waxing waning, gradual worsening abdominal cramping x 2-3 days. Pt notes PMHx of brain and lung cancer and diabetes. He states that he has had episodic nausea following radiation treatments for brain cancer for the past 3 weeks. Per spouse, he was improving over the past week, and his symptoms began to worsen 3 days ago.  He reports associated SOB, leg swelling and episodic nausea and vomiting. Pt reports that episodes of nausea begin with stomach cramping and occasionally end in emesis. He states that he has had multiple imaging scans of the abdomen done over the past week. He further reports being prescribed morphine 3 weeks ago and that his oncologist is Dr. Rogue Jury. Pt denies cough, congestion, and Hx of abdominal surgeries.       Past Medical History:  Diagnosis Date  . Adenocarcinoma of right lung, stage 4 ([place]) 05/27/2016  . Adenomatous polyp of colon 1996  . Arthritis    Osteoarthritis right knee, spine, wrist  . Arthritis of hand, right    Right thumb (Dr. Daylene Katayama)  . Back ache    r/o Multiple Myeloma  . Benign prostatic hypertrophy    (Dr. Karsten Ro in past)  . Degenerative disc disease   . Diverticulosis   . DVT (deep venous thrombosis) ([place]) 01/2014   R calf  . Encounter for antineoplastic chemotherapy 10/20/2015  . Erosive esophagitis   . GERD (gastroesophageal reflux disease)   . Hemorrhoids   . Hiatal hernia   . History of chronic prostatitis    . History of melanoma    Back (Dr. Wilhemina Bonito); early melanoma R arm 03/2011  . Multiple myeloma ([place]) 2013   skull lytic lesions--treated with radiation 07/2014  . Pulmonary embolism ([place]) 01/2014  . Radiation 07/21/14-08/05/14   left occipital parietal skull 25 gray  . SCC (squamous cell carcinoma) 08/04/15   right ear;dysplastic nevus with severe atypia-chest    Patient Active Problem List   Diagnosis Date Noted  . Adenocarcinoma of right lung, stage 4 ([place]) 05/27/2016  . Nausea with vomiting 05/18/2016  . Dehydration 05/18/2016  . Brain metastases ([place]) 05/04/2016  . Mediastinal adenopathy 05/04/2016  . Peripheral neuropathy due to chemotherapy ([place]) 02/08/2016  . Long term current use of anticoagulant therapy 11/04/2014  . Peripheral edema 11/04/2014  . Knee pain, bilateral 11/04/2014  . Headache 07/14/2014  . Abdominal aortic atherosclerosis ([place]) 04/28/2014  . Acute pulmonary embolism ([place]) 02/07/2014  . Acute deep vein thrombosis (DVT) of tibial vein of right lower extremity ([place]) 02/07/2014  . DVT of leg (deep venous thrombosis) ([place]) 02/06/2014  . Multiple myeloma ([place]) 01/27/2012  . Hyponatremia 01/16/2012  . Anemia 01/16/2012  . Dyspnea 04/18/2011  . Preop cardiovascular exam 04/18/2011  . ABDOMINAL PAIN-RUQ 04/29/2010  . IRRITABLE BOWEL SYNDROME 02/19/2009  . GERD 01/03/2008  . PERSONAL HX COLONIC POLYPS 01/03/2008    Past Surgical History:  Procedure Laterality Date  . APPENDECTOMY    . BONE MARROW TRANSPLANT  03/2013  Stem Cell  . COLONOSCOPY  2009   adenomatous polyp  . ESOPHAGOGASTRODUODENOSCOPY  2009   mild inflammation at esophagogastric junction  . excision of melanoma     back ( x3)  . Great toe surgery     bilateral  . INGUINAL HERNIA REPAIR     left, x 2  . port placemet  10/2012  . SCALENE NODE BIOPSY Right 05/11/2016   Procedure: BIOPSY RIGHT SCALENE NODE;  Surgeon: Grace Isaac, MD;  Location: [facility];  Service: Thoracic;  Laterality:  Right;  . TOTAL KNEE ARTHROPLASTY  06/08/2011   Procedure: TOTAL KNEE ARTHROPLASTY;  Surgeon: Ninetta Lights, MD;  Location: [facility];  Service: Orthopedics;  Laterality: Right;  osteonics  . total knee replaced  2000   Left knee       Home Medications    Prior to Admission medications   Medication Sig Start Date End Date Taking? Authorizing Provider  acetaminophen (TYLENOL) 500 MG tablet Take 500 mg by mouth every 6 (six) hours as needed for moderate pain or headache.   Yes Historical Provider, MD  ascorbic acid (VITAMIN C) 500 MG tablet Take 500 mg by mouth 2 (two) times daily.    Yes Historical Provider, MD  cholecalciferol (VITAMIN D) 1000 UNITS tablet Take 1,000 Units by mouth daily.   Yes Historical Provider, MD  desmopressin (DDAVP) 0.1 MG tablet Take 1 tablet (0.1 mg total) by mouth at bedtime. 04/25/16  Yes Rita Ohara, MD  dexamethasone (DECADRON) 4 MG tablet Take 4 mg by mouth every 6 (six) hours.   Yes Historical Provider, MD  dicyclomine (BENTYL) 10 MG capsule Take 1 capsule (10 mg total) by mouth 3 (three) times daily before meals. 06/01/16  Yes Rita Ohara, MD  Diphenhyd-Hydrocort-Nystatin (FIRST-DUKES MOUTHWASH) SUSP 15 cc swish and spit QID until thrush resolves 05/30/16  Yes Hayden Pedro, PA-C  fish oil-omega-3 fatty acids 1000 MG capsule Take 1 g by mouth daily.    Yes Historical Provider, MD  furosemide (LASIX) 20 MG tablet Take 1 tablet (20 mg total) by mouth daily. Patient taking differently: Take 20 mg by mouth daily as needed for fluid.  02/29/16  Yes Rita Ohara, MD  hydrOXYzine (VISTARIL) 50 MG capsule TK 1 C PO QID PRF NAUSEA 05/17/16  Yes Historical Provider, MD  LORazepam (ATIVAN) 0.5 MG tablet Take 1 tab PO Q 6 hours PRN nausea or anxiety 05/18/16  Yes Susanne Borders, NP  methocarbamol (ROBAXIN) 500 MG tablet TAKE 1 TO 2 TABLETS BY MOUTH EVERY 6 HOURS AS NEEDED FOR MUSCLE SPASMS 12/09/15  Yes Rita Ohara, MD  Multiple Vitamin (MULTIVITAMIN WITH MINERALS) TABS Take  1 tablet by mouth daily.   Yes Historical Provider, MD  nystatin (MYCOSTATIN) 100000 UNIT/ML suspension Take 5 mLs (500,000 Units total) by mouth 4 (four) times daily. Until thrush resolves 05/31/16  Yes Hayden Pedro, PA-C  ondansetron (ZOFRAN) 8 MG tablet Take 1 tablet (8 mg total) by mouth every 8 (eight) hours as needed for nausea or vomiting. 05/18/16  Yes Susanne Borders, NP  oxyCODONE (ROXICODONE) 5 MG immediate release tablet Take 1-2 tabs PO Q 6 hours PRN pain. 05/18/16  Yes Susanne Borders, NP  oxyCODONE-acetaminophen (PERCOCET/ROXICET) 5-325 MG tablet Take 1 tablet by mouth every 4 (four) hours as needed. 03/29/16  Yes Curt Bears, MD  prochlorperazine (COMPAZINE) 10 MG tablet Take 1 tablet (10 mg total) by mouth every 6 (six) hours as needed. Patient taking differently: Take 10  mg by mouth every 6 (six) hours as needed for nausea or vomiting.  04/29/16  Yes Curt Bears, MD  temazepam (RESTORIL) 15 MG capsule Take 1 capsule (15 mg total) by mouth at bedtime as needed. Patient taking differently: Take 15 mg by mouth at bedtime as needed for sleep.  04/08/16  Yes Curt Bears, MD  XARELTO 20 MG TABS tablet TAKE 1 TABLET BY MOUTH  DAILY 05/21/16  Yes Curt Bears, MD  mirabegron ER (MYRBETRIQ) 25 MG TB24 tablet Take 1 tablet (25 mg total) by mouth daily. Patient not taking: Reported on 06/06/2016 04/21/16   Rita Ohara, MD  pantoprazole (PROTONIX) 40 MG tablet TAKE 1 TABLET BY MOUTH TWO  TIMES DAILY 05/21/16   Curt Bears, MD  Probiotic Product (ALIGN) 4 MG CAPS Take 1 capsule by mouth daily. Reported on 09/10/2015    Historical Provider, MD  zolendronic acid (ZOMETA) 4 MG/5ML injection Inject 4 mg into the vein every 3 (three) months.     Historical Provider, MD    Family History Family History  Problem Relation Age of Onset  . Dementia Mother   . Stroke Father     brainstem stroke  . Hypertension Father   . Diabetes Father   . Prostate cancer Maternal Grandfather    . Depression Daughter   . Anesthesia problems Neg Hx     Social History Social History  Substance Use Topics  . Smoking status: Former Smoker    Packs/day: 3.00    Years: 10.00    Types: Cigarettes    Quit date: 07/11/1976  . Smokeless tobacco: Never Used     Comment: 3 PPD x 6 years  . Alcohol use 1.5 oz/week    3 Standard drinks or equivalent per week     Comment: 2 glasses of wine daily     Allergies   Tramadol; Amoxicillin; and Penicillins   Review of Systems Review of Systems  HENT: Negative for congestion.   Respiratory: Positive for shortness of breath. Negative for cough.   Cardiovascular: Positive for leg swelling.  Gastrointestinal: Positive for abdominal distention, abdominal pain, nausea and vomiting.     Physical Exam Updated Vital Signs BP 101/75 (BP Location: Right Arm)   Pulse 90   Temp 97.5 F (36.4 C) (Oral)   Resp 12   SpO2 96%   Physical Exam  Constitutional: He is oriented to person, place, and time. He appears well-developed and well-nourished. No distress.  HENT:  Head: Normocephalic and atraumatic.  Right Ear: Hearing normal.  Left Ear: Hearing normal.  Nose: Nose normal.  Mouth/Throat: Oropharynx is clear and moist and mucous membranes are normal.  Eyes: Conjunctivae and EOM are normal. Pupils are equal, round, and reactive to light.  Neck: Normal range of motion. Neck supple.  Cardiovascular: Regular rhythm, S1 normal and S2 normal.  Exam reveals no gallop and no friction rub.   No murmur heard. Pulmonary/Chest: Effort normal and breath sounds normal. No respiratory distress. He exhibits no tenderness.  Abdominal: Normal appearance. He exhibits distension. Bowel sounds are decreased. There is no hepatosplenomegaly. There is tenderness. There is no rebound, no guarding, no tenderness at McBurney's point and negative Murphy's sign. No hernia.  Epigastric and periumbilical tenderness noted.  Musculoskeletal: Normal range of motion.    Neurological: He is alert and oriented to person, place, and time. He has normal strength. No cranial nerve deficit or sensory deficit. Coordination normal. GCS eye subscore is 4. GCS verbal subscore is 5. GCS motor  subscore is 6.  Skin: Skin is warm, dry and intact. No rash noted. No cyanosis.  Psychiatric: He has a normal mood and affect. His speech is normal and behavior is normal. Thought content normal.  Nursing note and vitals reviewed.    ED Treatments / Results  DIAGNOSTIC STUDIES: Oxygen Saturation is 93% on RA, low by my interpretation.    COORDINATION OF CARE: 5:55 AM Discussed treatment plan with pt at bedside and pt agreed to plan.  Labs (all labs ordered are listed, but only abnormal results are displayed) Labs Reviewed  CBC WITH DIFFERENTIAL/PLATELET - Abnormal; Notable for the following:       Result Value   RDW 16.4 (*)    Neutro Abs 8.5 (*)    Lymphs Abs 0.5 (*)    All other components within normal limits  COMPREHENSIVE METABOLIC PANEL - Abnormal; Notable for the following:    Potassium 2.9 (*)    Chloride 100 (*)    Glucose, Bld 119 (*)    Creatinine, Ser 1.28 (*)    GFR calc non Af Amer 53 (*)    All other components within normal limits  C DIFFICILE QUICK SCREEN W PCR REFLEX  GASTROINTESTINAL PANEL BY PCR, STOOL (REPLACES STOOL CULTURE)  LIPASE, BLOOD  TROPONIN I  BRAIN NATRIURETIC PEPTIDE  URINALYSIS, ROUTINE W REFLEX MICROSCOPIC (NOT AT [facility])    EKG  EKG Interpretation  Date/Time:  Sunday June 05 2016 23:56:21 EST Ventricular Rate:  94 PR Interval:    QRS Duration: 127 QT Interval:  360 QTC Calculation: 451 R Axis:   30 Text Interpretation:  Sinus rhythm Prolonged PR interval Left atrial enlargement Right bundle branch block Confirmed by Hazle Coca [id number]) on 06/05/2016 11:58:57 PM       Radiology Ct Abdomen Pelvis W Contrast  Result Date: 06/06/2016 CLINICAL DATA:  Increasing abdominal pain for 3 days. Nausea and vomiting. White  cell count 9.6. History of lung cancer, melanoma, multiple myeloma, diabetes. EXAM: CT ABDOMEN AND PELVIS WITH CONTRAST TECHNIQUE: Multidetector CT imaging of the abdomen and pelvis was performed using the standard protocol following bolus administration of intravenous contrast. CONTRAST:  100 mL Isovue-300 COMPARISON:  04/28/2016 FINDINGS: Lower chest: Atelectasis in the lung bases. Mildly distended fluid-filled esophagus may indicate reflux. Prominent lymph nodes noted in the inferior right hilum measuring up to 1.4 cm. These are smaller than on the previous study. Hepatobiliary: No focal liver abnormality is seen. No gallstones, gallbladder wall thickening, or biliary dilatation. Pancreas: Unremarkable. No pancreatic ductal dilatation or surrounding inflammatory changes. Spleen: Normal in size without focal abnormality. Adrenals/Urinary Tract: No adrenal gland nodules. Focal scarring in the left kidney. Renal nephrograms are symmetrical. No hydronephrosis or hydroureter. Bladder wall is not thickened. Stomach/Bowel: Moderately fluid distended stomach without wall thickening. Fluid-filled distended small bowel with transition zone in the right lower quadrant representing mid to distal ileum. At the transition zone, there is focal wall thickening and edema suggesting an inflammatory process. Fluid-filled nondistended colon. Vascular/Lymphatic: Aortic atherosclerosis. No enlarged abdominal or pelvic lymph nodes. Reproductive: Prostate gland is mildly enlarged, measuring 4.3 cm diameter. Other: No abdominal wall hernia or abnormality. No abdominopelvic ascites. Musculoskeletal: Degenerative changes throughout the lumbar spine. Diffuse bone demineralization. No destructive bone lesions. IMPRESSION: Small bowel obstruction with transition zone in the right lower quadrant, likely due to inflammatory stricture. Probable gastroesophageal reflux. Fluid-filled nondistended colon. Right hilar lymphadenopathy is decreased  since prior study. Electronically Signed   By: Lucienne Capers M.D.   On:  06/06/2016 04:30   Dg Abd Acute W/chest  Result Date: 06/06/2016 CLINICAL DATA:  Nausea, vomiting, diarrhea, and all over abdominal pain for 5 days. Dizziness and lightheadedness. History of lung and brain cancer, last chemotherapy on Monday. Former smoker. EXAM: DG ABDOMEN ACUTE W/ 1V CHEST COMPARISON:  05/25/2016 FINDINGS: Power port type central venous catheter on the right with tip over the cavoatrial junction region. No pneumothorax. Linear scarring in the lung bases with low lung volumes. Calcified and tortuous aorta. The gaseous distention of mid abdominal small bowel with air-fluid levels. Appearance suggests partial obstruction. Scattered stool seen in the colon. No colonic distention. No free intra-abdominal air. No radiopaque stones. Degenerative changes throughout the spine and in the hips and shoulders. IMPRESSION: Linear scarring in the lung bases similar to previous study. Gas-filled distended small bowel loops with air-fluid levels suggesting small bowel obstruction. No free air. Electronically Signed   By: Lucienne Capers M.D.   On: 06/06/2016 00:59    Procedures Procedures (including critical care time)  Medications Ordered in ED Medications  iopamidol (ISOVUE-300) 61 % injection (not administered)  sodium chloride 0.9 % injection (not administered)  ondansetron (ZOFRAN) injection 4 mg (4 mg Intravenous Given 06/06/16 0015)  HYDROmorphone (DILAUDID) injection 1 mg (1 mg Intravenous Given 06/06/16 0015)  sodium chloride 0.9 % bolus 1,000 mL (0 mLs Intravenous Stopped 06/06/16 0506)  iopamidol (ISOVUE-300) 61 % injection 100 mL (100 mLs Intravenous Contrast Given 06/06/16 0359)  HYDROmorphone (DILAUDID) injection 1 mg (1 mg Intravenous Given 06/06/16 0347)  ondansetron (ZOFRAN) injection 4 mg (4 mg Intravenous Given 06/06/16 0347)  potassium chloride SA (K-DUR,KLOR-CON) CR tablet 40 mEq (40 mEq Oral Given  06/06/16 0504)     Initial Impression / Assessment and Plan / ED Course  I have reviewed the triage vital signs and the nursing notes.  Pertinent labs & imaging results that were available during my care of the patient were reviewed by me and considered in my medical decision making (see chart for details).  Clinical Course     Patient presents with complaints of Abdominal pain with nausea and vomiting. Patient reports that symptoms have been ongoing intermittently for 3 weeks. His workup thus far has been negative, no reason for the findings. It has been attributed to his brain radiation.  Examination revealed mildly distended and tympanitic abdomen with diffuse tenderness, no guarding or rebound. Plain film x-rays suggested obstruction. CT scan performed and does confirm transition zone.  Patient will be admitted to medicine. NG tube will be placed.  Final Clinical Impressions(s) / ED Diagnoses   Final diagnoses:  SBO (small bowel obstruction)    New Prescriptions New Prescriptions   No medications on file  I personally performed the services described in this documentation, which was scribed in my presence. The recorded information has been reviewed and is accurate.    Hector Greek, MD 06/06/16 [phone]

## 2016-06-05 NOTE — ED Notes (Signed)
EKG given to EDP,Rees,MD., for review. 

## 2016-06-06 ENCOUNTER — Telehealth: Payer: Self-pay

## 2016-06-06 ENCOUNTER — Encounter (HOSPITAL_COMMUNITY): Payer: Self-pay

## 2016-06-06 ENCOUNTER — Emergency Department (HOSPITAL_COMMUNITY): Payer: Medicare Other

## 2016-06-06 DIAGNOSIS — E860 Dehydration: Secondary | ICD-10-CM | POA: Diagnosis present

## 2016-06-06 DIAGNOSIS — K56699 Other intestinal obstruction unspecified as to partial versus complete obstruction: Secondary | ICD-10-CM | POA: Diagnosis not present

## 2016-06-06 DIAGNOSIS — N401 Enlarged prostate with lower urinary tract symptoms: Secondary | ICD-10-CM | POA: Diagnosis not present

## 2016-06-06 DIAGNOSIS — Z86711 Personal history of pulmonary embolism: Secondary | ICD-10-CM | POA: Diagnosis not present

## 2016-06-06 DIAGNOSIS — E876 Hypokalemia: Secondary | ICD-10-CM | POA: Diagnosis not present

## 2016-06-06 DIAGNOSIS — R933 Abnormal findings on diagnostic imaging of other parts of digestive tract: Secondary | ICD-10-CM | POA: Diagnosis not present

## 2016-06-06 DIAGNOSIS — M1711 Unilateral primary osteoarthritis, right knee: Secondary | ICD-10-CM | POA: Diagnosis present

## 2016-06-06 DIAGNOSIS — Z85820 Personal history of malignant melanoma of skin: Secondary | ICD-10-CM | POA: Diagnosis not present

## 2016-06-06 DIAGNOSIS — G9341 Metabolic encephalopathy: Secondary | ICD-10-CM | POA: Diagnosis not present

## 2016-06-06 DIAGNOSIS — K6389 Other specified diseases of intestine: Secondary | ICD-10-CM | POA: Diagnosis not present

## 2016-06-06 DIAGNOSIS — Z923 Personal history of irradiation: Secondary | ICD-10-CM | POA: Diagnosis not present

## 2016-06-06 DIAGNOSIS — R634 Abnormal weight loss: Secondary | ICD-10-CM | POA: Diagnosis present

## 2016-06-06 DIAGNOSIS — Z86718 Personal history of other venous thrombosis and embolism: Secondary | ICD-10-CM | POA: Diagnosis not present

## 2016-06-06 DIAGNOSIS — E232 Diabetes insipidus: Secondary | ICD-10-CM | POA: Diagnosis not present

## 2016-06-06 DIAGNOSIS — Z9484 Stem cells transplant status: Secondary | ICD-10-CM | POA: Diagnosis not present

## 2016-06-06 DIAGNOSIS — K5669 Other partial intestinal obstruction: Secondary | ICD-10-CM | POA: Diagnosis not present

## 2016-06-06 DIAGNOSIS — N411 Chronic prostatitis: Secondary | ICD-10-CM | POA: Diagnosis present

## 2016-06-06 DIAGNOSIS — R1084 Generalized abdominal pain: Secondary | ICD-10-CM | POA: Diagnosis not present

## 2016-06-06 DIAGNOSIS — Z9049 Acquired absence of other specified parts of digestive tract: Secondary | ICD-10-CM | POA: Diagnosis not present

## 2016-06-06 DIAGNOSIS — E870 Hyperosmolality and hypernatremia: Secondary | ICD-10-CM | POA: Diagnosis not present

## 2016-06-06 DIAGNOSIS — K56609 Unspecified intestinal obstruction, unspecified as to partial versus complete obstruction: Secondary | ICD-10-CM | POA: Diagnosis present

## 2016-06-06 DIAGNOSIS — C3491 Malignant neoplasm of unspecified part of right bronchus or lung: Secondary | ICD-10-CM | POA: Diagnosis not present

## 2016-06-06 DIAGNOSIS — K219 Gastro-esophageal reflux disease without esophagitis: Secondary | ICD-10-CM | POA: Diagnosis present

## 2016-06-06 DIAGNOSIS — R338 Other retention of urine: Secondary | ICD-10-CM | POA: Diagnosis not present

## 2016-06-06 DIAGNOSIS — D696 Thrombocytopenia, unspecified: Secondary | ICD-10-CM | POA: Diagnosis not present

## 2016-06-06 DIAGNOSIS — R935 Abnormal findings on diagnostic imaging of other abdominal regions, including retroperitoneum: Secondary | ICD-10-CM | POA: Diagnosis not present

## 2016-06-06 DIAGNOSIS — R109 Unspecified abdominal pain: Secondary | ICD-10-CM | POA: Diagnosis present

## 2016-06-06 DIAGNOSIS — C9000 Multiple myeloma not having achieved remission: Secondary | ICD-10-CM | POA: Diagnosis present

## 2016-06-06 DIAGNOSIS — C7931 Secondary malignant neoplasm of brain: Secondary | ICD-10-CM | POA: Diagnosis not present

## 2016-06-06 DIAGNOSIS — N179 Acute kidney failure, unspecified: Secondary | ICD-10-CM | POA: Diagnosis not present

## 2016-06-06 DIAGNOSIS — R112 Nausea with vomiting, unspecified: Secondary | ICD-10-CM | POA: Diagnosis not present

## 2016-06-06 DIAGNOSIS — E871 Hypo-osmolality and hyponatremia: Secondary | ICD-10-CM | POA: Diagnosis not present

## 2016-06-06 DIAGNOSIS — K529 Noninfective gastroenteritis and colitis, unspecified: Secondary | ICD-10-CM | POA: Diagnosis present

## 2016-06-06 LAB — COMPREHENSIVE METABOLIC PANEL
ALK PHOS: 58 U/L (ref 38–126)
ALT: 34 U/L (ref 17–63)
ANION GAP: 10 (ref 5–15)
AST: 22 U/L (ref 15–41)
Albumin: 4.1 g/dL (ref 3.5–5.0)
BUN: 11 mg/dL (ref 6–20)
CALCIUM: 10.1 mg/dL (ref 8.9–10.3)
CO2: 25 mmol/L (ref 22–32)
Chloride: 100 mmol/L — ABNORMAL LOW (ref 101–111)
Creatinine, Ser: 1.28 mg/dL — ABNORMAL HIGH (ref 0.61–1.24)
GFR calc non Af Amer: 53 mL/min — ABNORMAL LOW (ref >60)
Glucose, Bld: 119 mg/dL — ABNORMAL HIGH (ref 65–99)
POTASSIUM: 2.9 mmol/L — AB (ref 3.5–5.1)
SODIUM: 135 mmol/L (ref 135–145)
TOTAL PROTEIN: 7 g/dL (ref 6.5–8.1)
Total Bilirubin: 0.7 mg/dL (ref 0.3–1.2)

## 2016-06-06 LAB — GLUCOSE, CAPILLARY
GLUCOSE-CAPILLARY: 136 mg/dL — AB (ref 65–99)
GLUCOSE-CAPILLARY: 135 mg/dL — AB (ref 65–99)

## 2016-06-06 LAB — CBC WITH DIFFERENTIAL/PLATELET
Basophils Absolute: 0 10*3/uL (ref 0.0–0.1)
Basophils Relative: 0 %
EOS ABS: 0 10*3/uL (ref 0.0–0.7)
EOS PCT: 0 %
HCT: 41.1 % (ref 39.0–52.0)
HEMOGLOBIN: 14.7 g/dL (ref 13.0–17.0)
LYMPHS ABS: 0.5 10*3/uL — AB (ref 0.7–4.0)
Lymphocytes Relative: 5 %
MCH: 32.5 pg (ref 26.0–34.0)
MCHC: 35.8 g/dL (ref 30.0–36.0)
MCV: 90.9 fL (ref 78.0–100.0)
MONO ABS: 0.6 10*3/uL (ref 0.1–1.0)
MONOS PCT: 6 %
NEUTROS PCT: 89 %
Neutro Abs: 8.5 10*3/uL — ABNORMAL HIGH (ref 1.7–7.7)
Platelets: 181 10*3/uL (ref 150–400)
RBC: 4.52 MIL/uL (ref 4.22–5.81)
RDW: 16.4 % — AB (ref 11.5–15.5)
WBC: 9.6 10*3/uL (ref 4.0–10.5)

## 2016-06-06 LAB — URINE MICROSCOPIC-ADD ON

## 2016-06-06 LAB — BASIC METABOLIC PANEL
ANION GAP: 10 (ref 5–15)
BUN: 17 mg/dL (ref 6–20)
CALCIUM: 9.3 mg/dL (ref 8.9–10.3)
CHLORIDE: 102 mmol/L (ref 101–111)
CO2: 24 mmol/L (ref 22–32)
CREATININE: 1.58 mg/dL — AB (ref 0.61–1.24)
GFR calc non Af Amer: 41 mL/min — ABNORMAL LOW (ref >60)
GFR, EST AFRICAN AMERICAN: 48 mL/min — AB (ref >60)
Glucose, Bld: 114 mg/dL — ABNORMAL HIGH (ref 65–99)
Potassium: 3.1 mmol/L — ABNORMAL LOW (ref 3.5–5.1)
SODIUM: 136 mmol/L (ref 135–145)

## 2016-06-06 LAB — GASTROINTESTINAL PANEL BY PCR, STOOL (REPLACES STOOL CULTURE)

## 2016-06-06 LAB — URINALYSIS, ROUTINE W REFLEX MICROSCOPIC
Bilirubin Urine: NEGATIVE
Glucose, UA: NEGATIVE mg/dL
KETONES UR: NEGATIVE mg/dL
NITRITE: NEGATIVE
PH: 6.5 (ref 5.0–8.0)
PROTEIN: NEGATIVE mg/dL
Specific Gravity, Urine: 1.021 (ref 1.005–1.030)

## 2016-06-06 LAB — C DIFFICILE QUICK SCREEN W PCR REFLEX
C Diff antigen: NEGATIVE
C Diff interpretation: NOT DETECTED
C Diff toxin: NEGATIVE

## 2016-06-06 LAB — LIPASE, BLOOD: Lipase: 42 U/L (ref 11–51)

## 2016-06-06 LAB — MAGNESIUM: MAGNESIUM: 1.6 mg/dL — AB (ref 1.7–2.4)

## 2016-06-06 LAB — BRAIN NATRIURETIC PEPTIDE: B NATRIURETIC PEPTIDE 5: 45.8 pg/mL (ref 0.0–100.0)

## 2016-06-06 LAB — TROPONIN I

## 2016-06-06 MED ORDER — IOPAMIDOL (ISOVUE-300) INJECTION 61%
100.0000 mL | Freq: Once | INTRAVENOUS | Status: AC | PRN
  Administered 2016-06-06: 100 mL via INTRAVENOUS

## 2016-06-06 MED ORDER — MAGNESIUM SULFATE 2 GM/50ML IV SOLN
2.0000 g | Freq: Once | INTRAVENOUS | Status: AC
  Administered 2016-06-06: 2 g via INTRAVENOUS
  Filled 2016-06-06: qty 50

## 2016-06-06 MED ORDER — LIDOCAINE-PRILOCAINE 2.5-2.5 % EX CREA
TOPICAL_CREAM | Freq: Every day | CUTANEOUS | Status: DC | PRN
  Filled 2016-06-06: qty 5

## 2016-06-06 MED ORDER — HYDROMORPHONE HCL 1 MG/ML IJ SOLN
1.0000 mg | Freq: Once | INTRAMUSCULAR | Status: AC
  Administered 2016-06-06: 1 mg via INTRAVENOUS
  Filled 2016-06-06: qty 1

## 2016-06-06 MED ORDER — FAMOTIDINE IN NACL 20-0.9 MG/50ML-% IV SOLN: 20.0000 mg | Freq: Two times a day (BID) | INTRAVENOUS | Status: DC

## 2016-06-06 MED ORDER — ONDANSETRON HCL 4 MG/2ML IJ SOLN
4.0000 mg | Freq: Once | INTRAMUSCULAR | Status: AC
  Administered 2016-06-06: 4 mg via INTRAVENOUS
  Filled 2016-06-06: qty 2

## 2016-06-06 MED ORDER — ONDANSETRON HCL 4 MG/2ML IJ SOLN
4.0000 mg | Freq: Four times a day (QID) | INTRAMUSCULAR | Status: DC | PRN
  Administered 2016-06-08 – 2016-06-10 (×2): 4 mg via INTRAVENOUS
  Filled 2016-06-06 (×2): qty 2

## 2016-06-06 MED ORDER — SODIUM CHLORIDE 0.9 % IV BOLUS (SEPSIS)
1000.0000 mL | Freq: Once | INTRAVENOUS | Status: AC
  Administered 2016-06-06: 1000 mL via INTRAVENOUS

## 2016-06-06 MED ORDER — SODIUM CHLORIDE 0.9% FLUSH
10.0000 mL | INTRAVENOUS | Status: DC | PRN
  Administered 2016-06-07 – 2016-06-13 (×5): 10 mL
  Filled 2016-06-06 (×5): qty 40

## 2016-06-06 MED ORDER — SODIUM CHLORIDE 0.9% FLUSH
10.0000 mL | INTRAVENOUS | Status: DC | PRN
  Administered 2016-06-11: 10 mL
  Filled 2016-06-06: qty 40

## 2016-06-06 MED ORDER — RIVAROXABAN 20 MG PO TABS: 20.0000 mg | ORAL_TABLET | Freq: Every day | ORAL | Status: DC

## 2016-06-06 MED ORDER — HYDROMORPHONE HCL 1 MG/ML IJ SOLN
1.0000 mg | INTRAMUSCULAR | Status: DC | PRN
  Administered 2016-06-06: 1 mg via INTRAVENOUS
  Filled 2016-06-06: qty 1

## 2016-06-06 MED ORDER — POTASSIUM CHLORIDE CRYS ER 20 MEQ PO TBCR
40.0000 meq | EXTENDED_RELEASE_TABLET | Freq: Once | ORAL | Status: AC
Start: 2016-06-06 — End: 2016-06-06
  Administered 2016-06-06: 40 meq via ORAL
  Filled 2016-06-06: qty 2

## 2016-06-06 MED ORDER — SODIUM CHLORIDE 0.9 % IJ SOLN
INTRAMUSCULAR | Status: AC
  Filled 2016-06-06: qty 50

## 2016-06-06 MED ORDER — HYDROMORPHONE HCL 1 MG/ML IJ SOLN
1.0000 mg | INTRAMUSCULAR | Status: DC | PRN
  Administered 2016-06-06 (×2): 1 mg via INTRAVENOUS
  Filled 2016-06-06 (×3): qty 1

## 2016-06-06 MED ORDER — DEXAMETHASONE SODIUM PHOSPHATE 4 MG/ML IJ SOLN
4.0000 mg | Freq: Four times a day (QID) | INTRAMUSCULAR | Status: DC
  Administered 2016-06-06 – 2016-06-11 (×19): 4 mg via INTRAVENOUS
  Filled 2016-06-06 (×22): qty 1

## 2016-06-06 MED ORDER — ONDANSETRON HCL 4 MG PO TABS: 4.0000 mg | ORAL_TABLET | Freq: Four times a day (QID) | ORAL | Status: DC | PRN

## 2016-06-06 MED ORDER — LIDOCAINE 4 % EX CREA
TOPICAL_CREAM | Freq: Every day | CUTANEOUS | Status: DC | PRN
  Filled 2016-06-06: qty 5

## 2016-06-06 MED ORDER — POTASSIUM CHLORIDE IN NACL 40-0.9 MEQ/L-% IV SOLN
INTRAVENOUS | Status: DC
Start: 2016-06-06 — End: 2016-06-07
  Administered 2016-06-06 – 2016-06-07 (×2): 125 mL/h via INTRAVENOUS
  Filled 2016-06-06 (×3): qty 1000

## 2016-06-06 MED ORDER — HYDROMORPHONE HCL 1 MG/ML IJ SOLN
1.0000 mg | INTRAMUSCULAR | Status: DC | PRN
  Administered 2016-06-06 – 2016-06-09 (×13): 1 mg via INTRAVENOUS
  Filled 2016-06-06 (×12): qty 1

## 2016-06-06 MED ORDER — IOPAMIDOL (ISOVUE-300) INJECTION 61%
INTRAVENOUS | Status: AC
  Filled 2016-06-06: qty 100

## 2016-06-06 MED ORDER — PANTOPRAZOLE SODIUM 40 MG IV SOLR
40.0000 mg | INTRAVENOUS | Status: DC
  Administered 2016-06-06 – 2016-06-12 (×7): 40 mg via INTRAVENOUS
  Filled 2016-06-06 (×7): qty 40

## 2016-06-06 NOTE — ED Notes (Signed)
Patient unable to tolerate ng tube placement.

## 2016-06-06 NOTE — Progress Notes (Signed)
CHART NOTE  The patient is seen today. His wife was at the bedside. This is a very pleasant 75 years old white male with history of multiple myeloma status post several chemotherapy regimens, stem cell transplant as well as maintenance Revlimid. He was recently diagnosed with stage IV non-small cell lung cancer, adenocarcinoma with positive PDL1 expression of 80%. His marker study showed no actual mutations. The patient was scheduled to start treatment with Ketruda (pembrolizumab) on 06/07/2016 but unfortunately he was admitted to the hospital with questionable small bowel obstruction. He is feeling a little bit better today. He also has urinary retention requiring catheterization. I will delay his immunotherapy for later this week or early next week until improvement of his condition. Thank you for taking good care of [name], please call if you have any questions.

## 2016-06-06 NOTE — ED Notes (Signed)
Patient up to restroom, reports having large amounts of diarrhea.

## 2016-06-06 NOTE — ED Notes (Signed)
Dr.Pollina made aware of lab values and verbal order for 46mq Potassium PO given.

## 2016-06-06 NOTE — Progress Notes (Signed)
   06/06/16 1000  Clinical Encounter Type  Visited With Family  Referral From Nurse  Consult/Referral To Nurse  Spiritual Encounters  Spiritual Needs Emotional  Stress Factors  Patient Stress Factors Health changes  Family Stress Factors Exhausted  CHP spoke with patient's wife.  Provided presence, support if needed.  CHP will check back as schedule allows. Roe Coombs 06/06/16

## 2016-06-06 NOTE — Telephone Encounter (Signed)
Pt called stating he was admitted to [facility] for SBO. He will be there for several days and wants to r/s infusion appt scheduled for tomorrow.

## 2016-06-06 NOTE — H&P (Signed)
History and Physical  Patient Name: [name]     ZOX:[id number]    DOB: 02-19-1941    DOA: 06/05/2016 PCP: [name], MD   Patient coming from: Home  Chief Complaint: Abdominal pain  HPI: [name] is a 75 y.o. male with a past medical history significant for metastatic lung cancer, PE on Xarelto who presents with intermittent abdominal pain and vomiting.  The patient was in his usual state of health until a few weeks ago when he started to have intermittent nausea and vomiting, and intermittent distended and firm abdomen. He actually discussed this with his primary oncologist, who ordered an outpatient flat radiograph of the abdomen that showed a nonobstructive bowel gas pattern.  Then this weekend on Saturday, he vomited several times during the day.  In the evening he went out to dinner and afterwards vomited several times that night.  Sunday, he went to church, but in the afternoon his abdomen got "hard as a rock", developed a severe, generalized abdominal pain, and he couldn't keep anything down, so he came to the ER.  ED course: -Afebrile, heart rate 95, respirations and pulse ox normal, blood pressure 98/86 -Na 135, K 2.9, Cr 1.28 (baseline 0.9), WBC 9.6 K, Hgb 14.7 -LFTs normal, lipase normal -Flat radiograph of the abdomen showed distended air-filled loops of small bowel with fluid levels -CT of the abdomen showed small bowel obstruction with transition point near an area of "inflammatory process" -Placement of NG tube failed, and TRH were asked to evaluate for admission for SBO    The patient was first diagnosed with cancer in 2013, with multiple myeloma. He underwent 2 rounds of Velcade, Revlimid, carfilzomib then had a BMT at [facility].  Then in 2017 he was diagnosed with brain metastases, and found to have a lung primary.  He has undergone skull radiation and is about to start Prue this Tuesday.        ROS: Review of Systems  Constitutional: Negative for  chills and fever.  Gastrointestinal: Positive for abdominal pain, nausea and vomiting. Negative for blood in stool, constipation, diarrhea and melena.  All other systems reviewed and are negative.         Past Medical History:  Diagnosis Date  . Adenocarcinoma of right lung, stage 4 ([place]) 05/27/2016  . Adenomatous polyp of colon 1996  . Arthritis    Osteoarthritis right knee, spine, wrist  . Arthritis of hand, right    Right thumb (Dr. Daylene Katayama)  . Back ache    r/o Multiple Myeloma  . Benign prostatic hypertrophy    (Dr. Karsten Ro in past)  . Degenerative disc disease   . Diverticulosis   . DVT (deep venous thrombosis) ([place]) 01/2014   R calf  . Encounter for antineoplastic chemotherapy 10/20/2015  . Erosive esophagitis   . GERD (gastroesophageal reflux disease)   . Hemorrhoids   . Hiatal hernia   . History of chronic prostatitis   . History of melanoma    Back (Dr. Wilhemina Bonito); early melanoma R arm 03/2011  . Multiple myeloma ([place]) 2013   skull lytic lesions--treated with radiation 07/2014  . Pulmonary embolism ([place]) 01/2014  . Radiation 07/21/14-08/05/14   left occipital parietal skull 25 gray  . SCC (squamous cell carcinoma) 08/04/15   right ear;dysplastic nevus with severe atypia-chest    Past Surgical History:  Procedure Laterality Date  . APPENDECTOMY    . BONE MARROW TRANSPLANT  03/2013   Stem Cell  . COLONOSCOPY  2009   adenomatous polyp  . ESOPHAGOGASTRODUODENOSCOPY  2009   mild inflammation at esophagogastric junction  . excision of melanoma     back ( x3)  . Great toe surgery     bilateral  . INGUINAL HERNIA REPAIR     left, x 2  . port placemet  10/2012  . SCALENE NODE BIOPSY Right 05/11/2016   Procedure: BIOPSY RIGHT SCALENE NODE;  Surgeon: Grace Isaac, MD;  Location: [facility];  Service: Thoracic;  Laterality: Right;  . TOTAL KNEE ARTHROPLASTY  06/08/2011   Procedure: TOTAL KNEE ARTHROPLASTY;  Surgeon: Ninetta Lights, MD;  Location: [facility];  Service:  Orthopedics;  Laterality: Right;  osteonics  . total knee replaced  2000   Left knee    Social History: Patient lives with his wife of >50 years.  The patient walks unassited.  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  Has two daughters and four grandchildren, all in [place].  Remote former smoker.    Allergies  Allergen Reactions  . Tramadol Palpitations    "Made my chest feel tight"  . Amoxicillin     REACTION: rash  . Penicillins Rash    Has patient had a PCN reaction causing immediate rash, facial/tongue/throat swelling, SOB or lightheadedness with hypotension: unknown Has patient had a PCN reaction causing severe rash involving mucus membranes or skin necrosis: unknown Has patient had a PCN reaction that required hospitalization: no  Has patient had a PCN reaction occurring within the last 10 years: no  If all of the above answers are "NO", then may proceed with Cephalosporin use.     Family history: family history includes Dementia in his mother; Depression in his daughter; Diabetes in his father; Hypertension in his father; Prostate cancer in his maternal grandfather; Stroke in his father.  Prior to Admission medications   Medication Sig Start Date End Date Taking? Authorizing Provider  acetaminophen (TYLENOL) 500 MG tablet Take 500 mg by mouth every 6 (six) hours as needed for moderate pain or headache.   Yes Historical Provider, MD  ascorbic acid (VITAMIN C) 500 MG tablet Take 500 mg by mouth 2 (two) times daily.    Yes Historical Provider, MD  cholecalciferol (VITAMIN D) 1000 UNITS tablet Take 1,000 Units by mouth daily.   Yes Historical Provider, MD  desmopressin (DDAVP) 0.1 MG tablet Take 1 tablet (0.1 mg total) by mouth at bedtime. 04/25/16  Yes Rita Ohara, MD  dexamethasone (DECADRON) 4 MG tablet Take 4 mg by mouth every 6 (six) hours.   Yes Historical Provider, MD  dicyclomine (BENTYL) 10 MG capsule Take 1 capsule (10 mg total) by  mouth 3 (three) times daily before meals. 06/01/16  Yes Rita Ohara, MD  Diphenhyd-Hydrocort-Nystatin (FIRST-DUKES MOUTHWASH) SUSP 15 cc swish and spit QID until thrush resolves 05/30/16  Yes Hayden Pedro, PA-C  fish oil-omega-3 fatty acids 1000 MG capsule Take 1 g by mouth daily.    Yes Historical Provider, MD  furosemide (LASIX) 20 MG tablet Take 1 tablet (20 mg total) by mouth daily. Patient taking differently: Take 20 mg by mouth daily as needed for fluid.  02/29/16  Yes Rita Ohara, MD  hydrOXYzine (VISTARIL) 50 MG capsule TK 1 C PO QID PRF NAUSEA 05/17/16  Yes Historical Provider, MD  LORazepam (ATIVAN) 0.5 MG tablet Take 1 tab PO Q 6 hours PRN nausea or anxiety 05/18/16  Yes Susanne Borders, NP  methocarbamol (ROBAXIN) 500 MG  tablet TAKE 1 TO 2 TABLETS BY MOUTH EVERY 6 HOURS AS NEEDED FOR MUSCLE SPASMS 12/09/15  Yes Rita Ohara, MD  Multiple Vitamin (MULTIVITAMIN WITH MINERALS) TABS Take 1 tablet by mouth daily.   Yes Historical Provider, MD  nystatin (MYCOSTATIN) 100000 UNIT/ML suspension Take 5 mLs (500,000 Units total) by mouth 4 (four) times daily. Until thrush resolves 05/31/16  Yes Hayden Pedro, PA-C  ondansetron (ZOFRAN) 8 MG tablet Take 1 tablet (8 mg total) by mouth every 8 (eight) hours as needed for nausea or vomiting. 05/18/16  Yes Susanne Borders, NP  oxyCODONE (ROXICODONE) 5 MG immediate release tablet Take 1-2 tabs PO Q 6 hours PRN pain. 05/18/16  Yes Susanne Borders, NP  oxyCODONE-acetaminophen (PERCOCET/ROXICET) 5-325 MG tablet Take 1 tablet by mouth every 4 (four) hours as needed. 03/29/16  Yes Curt Bears, MD  prochlorperazine (COMPAZINE) 10 MG tablet Take 1 tablet (10 mg total) by mouth every 6 (six) hours as needed. Patient taking differently: Take 10 mg by mouth every 6 (six) hours as needed for nausea or vomiting.  04/29/16  Yes Curt Bears, MD  temazepam (RESTORIL) 15 MG capsule Take 1 capsule (15 mg total) by mouth at bedtime as needed. Patient taking  differently: Take 15 mg by mouth at bedtime as needed for sleep.  04/08/16  Yes Curt Bears, MD  XARELTO 20 MG TABS tablet TAKE 1 TABLET BY MOUTH  DAILY 05/21/16  Yes Curt Bears, MD  mirabegron ER (MYRBETRIQ) 25 MG TB24 tablet Take 1 tablet (25 mg total) by mouth daily. Patient not taking: Reported on 06/06/2016 04/21/16   Rita Ohara, MD  pantoprazole (PROTONIX) 40 MG tablet TAKE 1 TABLET BY MOUTH TWO  TIMES DAILY 05/21/16   Curt Bears, MD  Probiotic Product (ALIGN) 4 MG CAPS Take 1 capsule by mouth daily. Reported on 09/10/2015    Historical Provider, MD  zolendronic acid (ZOMETA) 4 MG/5ML injection Inject 4 mg into the vein every 3 (three) months.     Historical Provider, MD       Physical Exam: BP 101/75 (BP Location: Right Arm)   Pulse 90   Temp 97.5 F (36.4 C) (Oral)   Resp 12   SpO2 96%  General appearance: Well-developed, elderly adult male, alert and in no acute distress.   Eyes: Anicteric, conjunctiva pink, lids and lashes normal. PERRL.    ENT: No nasal deformity, discharge, epistaxis.  Hearing normal. OP moist without lesions.   Neck: No neck masses.  Trachea midline.  No thyromegaly/tenderness. Lymph: No cervical or supraclavicular lymphadenopathy. Skin: Warm and dry.  No jaundice.  No suspicious rashes or lesions. Cardiac: RRR, nl S1-S2, no murmurs appreciated.  Capillary refill is brisk.  JVP normal.  No LE edema.  Radial and DP pulses 2+ and symmetric. Respiratory: Normal respiratory rate and rhythm.  CTAB without rales or wheezes. Abdomen: Abdomen firm, with voluntary or involuntary guarding.  No tympany.  Mild distension.  TTP diffusely. MSK: No deformities or effusions.  No cyanosis or clubbing. Neuro: Cranial nerves normal.  Sensation intact to light touch. Speech is fluent.  Muscle strength normal.    Psych: Sensorium intact and responding to questions, rattention normal.  Behavior appropriate.  Affect normal.  Judgment and insight appear  normal.     Labs on Admission:  I have personally reviewed following labs and imaging studies: CBC:  Recent Labs Lab 06/05/16 2353  WBC 9.6  NEUTROABS 8.5*  HGB 14.7  HCT 41.1  MCV 90.9  PLT 702   Basic Metabolic Panel:  Recent Labs Lab 06/05/16 2353  NA 135  K 2.9*  CL 100*  CO2 25  GLUCOSE 119*  BUN 11  CREATININE 1.28*  CALCIUM 10.1   GFR: Estimated Creatinine Clearance: 56.6 mL/min (by C-G formula based on SCr of 1.28 mg/dL (H)).  Liver Function Tests:  Recent Labs Lab 06/05/16 2353  AST 22  ALT 34  ALKPHOS 58  BILITOT 0.7  PROT 7.0  ALBUMIN 4.1    Recent Labs Lab 06/05/16 2353  LIPASE 42   No results for input(s): AMMONIA in the last 168 hours. Coagulation Profile: No results for input(s): INR, PROTIME in the last 168 hours. Cardiac Enzymes:  Recent Labs Lab 06/05/16 2353  TROPONINI <0.03   BNP (last 3 results) No results for input(s): PROBNP in the last 8760 hours. HbA1C: No results for input(s): HGBA1C in the last 72 hours. CBG: No results for input(s): GLUCAP in the last 168 hours. Lipid Profile: No results for input(s): CHOL, HDL, LDLCALC, TRIG, CHOLHDL, LDLDIRECT in the last 72 hours. Thyroid Function Tests: No results for input(s): TSH, T4TOTAL, FREET4, T3FREE, THYROIDAB in the last 72 hours. Anemia Panel: No results for input(s): VITAMINB12, FOLATE, FERRITIN, TIBC, IRON, RETICCTPCT in the last 72 hours. Sepsis Labs: Invalid input(s): PROCALCITONIN, LACTICIDVEN Recent Results (from the past 240 hour(s))  C difficile quick scan w PCR reflex     Status: None   Collection Time: 06/06/16  4:07 AM  Result Value Ref Range Status   C Diff antigen NEGATIVE NEGATIVE Final   C Diff toxin NEGATIVE NEGATIVE Final   C Diff interpretation No C. difficile detected.  Final         Radiological Exams on Admission: Personally reviewed CXR and abdomen x-ray personally reviewed, shows no pneumonia but air fluid levels in a SBO patter.  CT  report reviewed: Ct Abdomen Pelvis W Contrast  Result Date: 06/06/2016 CLINICAL DATA:  Increasing abdominal pain for 3 days. Nausea and vomiting. White cell count 9.6. History of lung cancer, melanoma, multiple myeloma, diabetes. EXAM: CT ABDOMEN AND PELVIS WITH CONTRAST TECHNIQUE: Multidetector CT imaging of the abdomen and pelvis was performed using the standard protocol following bolus administration of intravenous contrast. CONTRAST:  100 mL Isovue-300 COMPARISON:  04/28/2016 FINDINGS: Lower chest: Atelectasis in the lung bases. Mildly distended fluid-filled esophagus may indicate reflux. Prominent lymph nodes noted in the inferior right hilum measuring up to 1.4 cm. These are smaller than on the previous study. Hepatobiliary: No focal liver abnormality is seen. No gallstones, gallbladder wall thickening, or biliary dilatation. Pancreas: Unremarkable. No pancreatic ductal dilatation or surrounding inflammatory changes. Spleen: Normal in size without focal abnormality. Adrenals/Urinary Tract: No adrenal gland nodules. Focal scarring in the left kidney. Renal nephrograms are symmetrical. No hydronephrosis or hydroureter. Bladder wall is not thickened. Stomach/Bowel: Moderately fluid distended stomach without wall thickening. Fluid-filled distended small bowel with transition zone in the right lower quadrant representing mid to distal ileum. At the transition zone, there is focal wall thickening and edema suggesting an inflammatory process. Fluid-filled nondistended colon. Vascular/Lymphatic: Aortic atherosclerosis. No enlarged abdominal or pelvic lymph nodes. Reproductive: Prostate gland is mildly enlarged, measuring 4.3 cm diameter. Other: No abdominal wall hernia or abnormality. No abdominopelvic ascites. Musculoskeletal: Degenerative changes throughout the lumbar spine. Diffuse bone demineralization. No destructive bone lesions. IMPRESSION: Small bowel obstruction with transition zone in the right lower  quadrant, likely due to inflammatory stricture. Probable gastroesophageal reflux. Fluid-filled nondistended colon. Right hilar lymphadenopathy  is decreased since prior study. Electronically Signed   By: Lucienne Capers M.D.   On: 06/06/2016 04:30   Dg Abd Acute W/chest  Result Date: 06/06/2016 CLINICAL DATA:  Nausea, vomiting, diarrhea, and all over abdominal pain for 5 days. Dizziness and lightheadedness. History of lung and brain cancer, last chemotherapy on Monday. Former smoker. EXAM: DG ABDOMEN ACUTE W/ 1V CHEST COMPARISON:  05/25/2016 FINDINGS: Power port type central venous catheter on the right with tip over the cavoatrial junction region. No pneumothorax. Linear scarring in the lung bases with low lung volumes. Calcified and tortuous aorta. The gaseous distention of mid abdominal small bowel with air-fluid levels. Appearance suggests partial obstruction. Scattered stool seen in the colon. No colonic distention. No free intra-abdominal air. No radiopaque stones. Degenerative changes throughout the spine and in the hips and shoulders. IMPRESSION: Linear scarring in the lung bases similar to previous study. Gas-filled distended small bowel loops with air-fluid levels suggesting small bowel obstruction. No free air. Electronically Signed   By: Lucienne Capers M.D.   On: 06/06/2016 00:59    EKG: Independently reviewed. Rate 94, QTc 451, sinus rhythm.    Assessment/Plan  1. SBO:    -Serial abdominal exams -NPO -MIVF -Ondansetron for nausea, IV hydromorphone for pain -Attempt NG tube again if worsening vomiting   2. Hypokalemia:  -Check mag -Replete in IVF -Repeat BMP   3. Mildly elevated creatinine:  Suspect dehydration. -IVF -Repeat BMP  4. History of PE:  -Continue Xarelto -If NG tube placed, will have to consider Lovenox bridge  5. Metastatic lung cancer:  -Hold dexamethasone for now, if NPO for extended period, will restart or start IV alternative  6. Other  medications:  Hold until taking PO: -Hold DDAVP  -Hold furosemide, lorazepam, Robaxin PRNs -Hold PPI        DVT prophylaxis: N/A on rivaroxaban  Code Status: FULL  Family Communication: None present  Disposition Plan: Anticipate IV fluids and conservative mgmt of SBO.  If worsening or fails to resolve, will discuss with General Surgery. Consults called: None Admission status: INPATIENT, med surg    Medical decision making: Patient seen at 6:00 AM on 06/06/2016.  The patient was discussed with Dr. Betsey Holiday.  What exists of the patient's chart was reviewed in depth and summarized above.  Clinical condition: stable.        Edwin Dada [facility] Pager [phone]     At the time of admission, it appears that the appropriate admission status for this patient is INPATIENT. This is judged to be reasonable and necessary in order to provide the required intensity of service to ensure the patient's safety given the presenting symptoms, physical exam findings, and initial radiographic and laboratory data in the context of their chronic comorbidities.  Together, these circumstances are felt to place her/him at high risk for further clinical deterioration threatening life, limb, or organ.  I certify that at the point of admission it is my clinical judgment that the patient will require inpatient hospital care spanning beyond 2 midnights from the point of admission and that early discharge would result in unnecessary risk of decompensation and readmission or threat to life, limb or bodily function.

## 2016-06-06 NOTE — Progress Notes (Signed)
PROGRESS NOTE    [name]  XBD:[id number] DOB: 1940-12-16 DOA: 06/05/2016 PCP: [name], MD   Brief Narrative: 75 y.o. male with a past medical history significant for metastatic lung cancer, PE on Xarelto who presents with intermittent abdominal pain and vomiting.CT of the abdomen showed small bowel obstruction with transition point near an area of "inflammatory process".Placement of NG tube failed, and TRH were asked to evaluate for admission for SBO.  (The patient was first diagnosed with cancer in 2013, with multiple myeloma. He underwent 2 rounds of Velcade, Revlimid, carfilzomib then had a BMT at [facility].  Then in 2017 he was diagnosed with brain metastases, and found to have a lung primary.  He has undergone skull radiation and is about to start [place] this Tuesday.)  Assessment & Plan:   # Small bowel obstruction: -keep NPO, pepcid IV -surgery consult called -failed NG tube placement as per H&P, pt declined NG tube at this time. She is not actively vomiting. -on IV dilaudid and IV zofran -continue supportive care.   # Hypokalemia/hypomagnesemia: Replete potassium and magnesium. Monitor labs.   #Acute kidney injury likely due to dehydration: Continue IV fluid. Monitor BMP. Patient denied dysuria, urgency or frequency. As per RN, patient was not able to void, we will get bladder scan to see if patient has urinary retention. He may need insertion of Foley catheter. Discussed with the nurse.  #  History of pulmonary embolism: xarelto on hold now because of possible need of surgery. Continue SCD. Need to resume xarelto when able.   #  Brain metastases ([place])   Adenocarcinoma of right lung, stage 4 ([place]): changed decadron to Iv. On PPI. Discussed with pharmacist.     DVT prophylaxis:SCD Code Status:full Family Communication:no Disposition Plan:likely dc home in 1-2 days depending on clinical improvement.  Consultants:   Surgery  team  Procedures:none Antimicrobials:none  Subjective: The patient was seen and examined at bedside this morning. He reported feeling better with the pain medication. Denied nausea vomiting, chest pain or shortness of breath. Feels weak. No headache or dizziness. He does not want NG tube.   Objective: Vitals:   06/05/16 2338 06/06/16 0352 06/06/16 0823 06/06/16 0900  BP: 100/79 101/75 101/72   Pulse:  90 86   Resp:  12 12   Temp:   98.2 F (36.8 C)   TempSrc:   Oral   SpO2:  96% 98%   Weight:    94.6 kg (208 lb 8.9 oz)  Height:    5' 9" (1.753 m)    Intake/Output Summary (Last 24 hours) at 06/06/16 1359 Last data filed at 06/06/16 1000  Gross per 24 hour  Intake             1000 ml  Output                0 ml  Net             1000 ml   Filed Weights   06/06/16 0900  Weight: 94.6 kg (208 lb 8.9 oz)    Examination:  General exam: Appears calm and comfortable  Respiratory system: Clear to auscultation. Respiratory effort normal. No wheezing or crackle Cardiovascular system: S1 & S2 heard, RRR.  No pedal edema. Gastrointestinal system: Abdomen is nondistended, soft and diffuse tenderness. Sluggish bowel sounds heard. Central nervous system: Alert and oriented. No focal neurological deficits. Extremities: Symmetric 5 x 5 power. Skin: No rashes, lesions or ulcers Psychiatry: Judgement and insight appear  normal. Mood & affect appropriate.     Data Reviewed: I have personally reviewed following labs and imaging studies  CBC:  Recent Labs Lab 06/05/16 2353  WBC 9.6  NEUTROABS 8.5*  HGB 14.7  HCT 41.1  MCV 90.9  PLT 419   Basic Metabolic Panel:  Recent Labs Lab 06/05/16 2353 06/06/16 0833  NA 135 136  K 2.9* 3.1*  CL 100* 102  CO2 25 24  GLUCOSE 119* 114*  BUN 11 17  CREATININE 1.28* 1.58*  CALCIUM 10.1 9.3  MG  --  1.6*   GFR: Estimated Creatinine Clearance: 45.9 mL/min (by C-G formula based on SCr of 1.58 mg/dL (H)). Liver Function  Tests:  Recent Labs Lab 06/05/16 2353  AST 22  ALT 34  ALKPHOS 58  BILITOT 0.7  PROT 7.0  ALBUMIN 4.1    Recent Labs Lab 06/05/16 2353  LIPASE 42   No results for input(s): AMMONIA in the last 168 hours. Coagulation Profile: No results for input(s): INR, PROTIME in the last 168 hours. Cardiac Enzymes:  Recent Labs Lab 06/05/16 2353  TROPONINI <0.03   BNP (last 3 results) No results for input(s): PROBNP in the last 8760 hours. HbA1C: No results for input(s): HGBA1C in the last 72 hours. CBG: No results for input(s): GLUCAP in the last 168 hours. Lipid Profile: No results for input(s): CHOL, HDL, LDLCALC, TRIG, CHOLHDL, LDLDIRECT in the last 72 hours. Thyroid Function Tests: No results for input(s): TSH, T4TOTAL, FREET4, T3FREE, THYROIDAB in the last 72 hours. Anemia Panel: No results for input(s): VITAMINB12, FOLATE, FERRITIN, TIBC, IRON, RETICCTPCT in the last 72 hours. Sepsis Labs: No results for input(s): PROCALCITON, LATICACIDVEN in the last 168 hours.  Recent Results (from the past 240 hour(s))  C difficile quick scan w PCR reflex     Status: None   Collection Time: 06/06/16  4:07 AM  Result Value Ref Range Status   C Diff antigen NEGATIVE NEGATIVE Final   C Diff toxin NEGATIVE NEGATIVE Final   C Diff interpretation No C. difficile detected.  Final  Gastrointestinal Panel by PCR , Stool     Status: None   Collection Time: 06/06/16  4:07 AM  Result Value Ref Range Status   Campylobacter species NOT DETECTED NOT DETECTED Final   Plesimonas shigelloides NOT DETECTED NOT DETECTED Final   Salmonella species NOT DETECTED NOT DETECTED Final   Yersinia enterocolitica NOT DETECTED NOT DETECTED Final   Vibrio species NOT DETECTED NOT DETECTED Final   Vibrio cholerae NOT DETECTED NOT DETECTED Final   Enteroaggregative E coli (EAEC) NOT DETECTED NOT DETECTED Final   Enteropathogenic E coli (EPEC) NOT DETECTED NOT DETECTED Final   Enterotoxigenic E coli (ETEC) NOT  DETECTED NOT DETECTED Final   Shiga like toxin producing E coli (STEC) NOT DETECTED NOT DETECTED Final   Shigella/Enteroinvasive E coli (EIEC) NOT DETECTED NOT DETECTED Final   Cryptosporidium NOT DETECTED NOT DETECTED Final   Cyclospora cayetanensis NOT DETECTED NOT DETECTED Final   Entamoeba histolytica NOT DETECTED NOT DETECTED Final   Giardia lamblia NOT DETECTED NOT DETECTED Final   Adenovirus F40/41 NOT DETECTED NOT DETECTED Final   Astrovirus NOT DETECTED NOT DETECTED Final   Norovirus GI/GII NOT DETECTED NOT DETECTED Final   Rotavirus A NOT DETECTED NOT DETECTED Final   Sapovirus (I, II, IV, and V) NOT DETECTED NOT DETECTED Final         Radiology Studies: Ct Abdomen Pelvis W Contrast  Result Date: 06/06/2016 CLINICAL DATA:  Increasing abdominal pain for 3 days. Nausea and vomiting. White cell count 9.6. History of lung cancer, melanoma, multiple myeloma, diabetes. EXAM: CT ABDOMEN AND PELVIS WITH CONTRAST TECHNIQUE: Multidetector CT imaging of the abdomen and pelvis was performed using the standard protocol following bolus administration of intravenous contrast. CONTRAST:  100 mL Isovue-300 COMPARISON:  04/28/2016 FINDINGS: Lower chest: Atelectasis in the lung bases. Mildly distended fluid-filled esophagus may indicate reflux. Prominent lymph nodes noted in the inferior right hilum measuring up to 1.4 cm. These are smaller than on the previous study. Hepatobiliary: No focal liver abnormality is seen. No gallstones, gallbladder wall thickening, or biliary dilatation. Pancreas: Unremarkable. No pancreatic ductal dilatation or surrounding inflammatory changes. Spleen: Normal in size without focal abnormality. Adrenals/Urinary Tract: No adrenal gland nodules. Focal scarring in the left kidney. Renal nephrograms are symmetrical. No hydronephrosis or hydroureter. Bladder wall is not thickened. Stomach/Bowel: Moderately fluid distended stomach without wall thickening. Fluid-filled distended  small bowel with transition zone in the right lower quadrant representing mid to distal ileum. At the transition zone, there is focal wall thickening and edema suggesting an inflammatory process. Fluid-filled nondistended colon. Vascular/Lymphatic: Aortic atherosclerosis. No enlarged abdominal or pelvic lymph nodes. Reproductive: Prostate gland is mildly enlarged, measuring 4.3 cm diameter. Other: No abdominal wall hernia or abnormality. No abdominopelvic ascites. Musculoskeletal: Degenerative changes throughout the lumbar spine. Diffuse bone demineralization. No destructive bone lesions. IMPRESSION: Small bowel obstruction with transition zone in the right lower quadrant, likely due to inflammatory stricture. Probable gastroesophageal reflux. Fluid-filled nondistended colon. Right hilar lymphadenopathy is decreased since prior study. Electronically Signed   By: Lucienne Capers M.D.   On: 06/06/2016 04:30   Dg Abd Acute W/chest  Result Date: 06/06/2016 CLINICAL DATA:  Nausea, vomiting, diarrhea, and all over abdominal pain for 5 days. Dizziness and lightheadedness. History of lung and brain cancer, last chemotherapy on Monday. Former smoker. EXAM: DG ABDOMEN ACUTE W/ 1V CHEST COMPARISON:  05/25/2016 FINDINGS: Power port type central venous catheter on the right with tip over the cavoatrial junction region. No pneumothorax. Linear scarring in the lung bases with low lung volumes. Calcified and tortuous aorta. The gaseous distention of mid abdominal small bowel with air-fluid levels. Appearance suggests partial obstruction. Scattered stool seen in the colon. No colonic distention. No free intra-abdominal air. No radiopaque stones. Degenerative changes throughout the spine and in the hips and shoulders. IMPRESSION: Linear scarring in the lung bases similar to previous study. Gas-filled distended small bowel loops with air-fluid levels suggesting small bowel obstruction. No free air. Electronically Signed   By:  Lucienne Capers M.D.   On: 06/06/2016 00:59        Scheduled Meds: . dexamethasone  4 mg Intravenous Q6H  . iopamidol      . pantoprazole (PROTONIX) IV  40 mg Intravenous Q24H  . sodium chloride       Continuous Infusions: . 0.9 % NaCl with KCl 40 mEq / L 125 mL/hr (06/06/16 0930)     LOS: 0 days   Dron Tanna Furry, MD [facility] Pager [phone]  If 7PM-7AM, please contact night-coverage [email or web address] Password [email or web address] 06/06/2016, 1:59 PM

## 2016-06-06 NOTE — ED Notes (Signed)
Pt. Made aware for the need of urine. 

## 2016-06-06 NOTE — Consult Note (Signed)
Reason for Consult:SBO Referring Physician: Dr. Katheran James  [name] is an 75 y.o. male.  HPI: Pt presents with several weeks of intermittent nausea, vomiting and distension. He says it comes and goes, with episodes lasting about 20 minutes then followed with diarrhea. Then symptoms seem to get better.  He was diagnosed with IBS by Dr. Fuller Plan in the past and treated with Bentyl.  This helped in the past, but does not seem to have any effect now.    He has lost about 8 lbs over the last 2 weeks secondary to this.  It sounds like when he eats symptoms are worse.  His oncologist had seen him and obtained CT scans, 04/28/16,  that were non obstructive. He was found to have brain metastasis at that point.  He has had 5 treatments with radiation therapy and the last was 05/30/16.  He is to start immunotherapy this coming week.    He presented with 5 days of ongoing symptoms on 06/05/16 to the ED via EMS.  Films in ED,suggesting SBO, no free air. CT scan shows: Moderately fluid distended stomach without wall thickening. Fluid-filled distended small bowel with transition zone in the right lower quadrant representing mid to distal ileum. At the transition zone, there is focal wall thickening and edema suggesting an inflammatory process. Fluid-filled nondistended colon. Impression was: Small bowel obstruction with transition zone in the right lower quadrant, likely due to inflammatory stricture. Probable gastroesophageal reflux. Fluid-filled nondistended colon. Right hilar lymphadenopathy is decreased since prior study. He was sen and admitted by Medicine.  He had radiation therapy for his brain mestatasis on 05/30/16 for his metastatic lung cancer.  Labs in ED last PM shows renal insuffiencey, with creatinine up to 1.28, normal WBC.  He also has multiple myeloma and a history of skin Melanoma with previous resection.  He has undergone chemotherapy and is on Xarelto for PE/DVT diagnosed on 02/07/14. His only  abdominal surgeries were appendectomy and bilateral inguinal hernia repairs.   C diff and stool panel are negative so far.  We are ask to see.  Past Medical History:  Diagnosis Date  . Adenocarcinoma of right lung, stage 4 ([place]) 05/27/2016  . Adenomatous polyp of colon 1996  . Arthritis    Osteoarthritis right knee, spine, wrist  . Arthritis of hand, right    Right thumb (Dr. Daylene Katayama)  . Back ache    r/o Multiple Myeloma  . Benign prostatic hypertrophy    (Dr. Karsten Ro in past)  . Degenerative disc disease   . Diverticulosis   . DVT (deep venous thrombosis) ([place]) 01/2014   R calf  . Encounter for antineoplastic chemotherapy 10/20/2015  . Erosive esophagitis   . GERD (gastroesophageal reflux disease)   . Hemorrhoids   . Hiatal hernia   . History of chronic prostatitis   . History of melanoma    Back (Dr. Wilhemina Bonito); early melanoma R arm 03/2011  . Multiple myeloma ([place]) 2013   skull lytic lesions--treated with radiation 07/2014  . Pulmonary embolism ([place]) 01/2014  . Radiation 07/21/14-08/05/14   left occipital parietal skull 25 gray  . SCC (squamous cell carcinoma) 08/04/15   right ear;dysplastic nevus with severe atypia-chest    Past Surgical History:  Procedure Laterality Date  . APPENDECTOMY    . BONE MARROW TRANSPLANT  03/2013   Stem Cell  . COLONOSCOPY  2009   adenomatous polyp  . ESOPHAGOGASTRODUODENOSCOPY  2009   mild inflammation at esophagogastric junction  . excision  of melanoma     back ( x3)  . Great toe surgery     bilateral  . INGUINAL HERNIA REPAIR     left, x 2  . port placemet  10/2012  . SCALENE NODE BIOPSY Right 05/11/2016   Procedure: BIOPSY RIGHT SCALENE NODE;  Surgeon: Delight Ovens, MD;  Location: [facility] OR;  Service: Thoracic;  Laterality: Right;  . TOTAL KNEE ARTHROPLASTY  06/08/2011   Procedure: TOTAL KNEE ARTHROPLASTY;  Surgeon: Loreta Ave, MD;  Location: [facility] OR;  Service: Orthopedics;  Laterality: Right;  osteonics  . total knee replaced  2000    Left knee    Family History  Problem Relation Age of Onset  . Dementia Mother   . Stroke Father     brainstem stroke  . Hypertension Father   . Diabetes Father   . Prostate cancer Maternal Grandfather   . Depression Daughter   . Anesthesia problems Neg Hx     Social History:  reports that he quit smoking about 39 years ago. His smoking use included Cigarettes. He has a 30.00 pack-year smoking history. He has never used smokeless tobacco. He reports that he drinks about 1.5 oz of alcohol per week . He reports that he does not use drugs.  Allergies:  Allergies  Allergen Reactions  . Tramadol Palpitations    "Made my chest feel tight"  . Amoxicillin     REACTION: rash  . Penicillins Rash    Has patient had a PCN reaction causing immediate rash, facial/tongue/throat swelling, SOB or lightheadedness with hypotension: unknown Has patient had a PCN reaction causing severe rash involving mucus membranes or skin necrosis: unknown Has patient had a PCN reaction that required hospitalization: no  Has patient had a PCN reaction occurring within the last 10 years: no  If all of the above answers are "NO", then may proceed with Cephalosporin use.     Medications:  Prior to Admission:  Prescriptions Prior to Admission  Medication Sig Dispense Refill Last Dose  . acetaminophen (TYLENOL) 500 MG tablet Take 500 mg by mouth every 6 (six) hours as needed for moderate pain or headache.   unknown  . ascorbic acid (VITAMIN C) 500 MG tablet Take 500 mg by mouth 2 (two) times daily.    3 weeks at Unknown time  . cholecalciferol (VITAMIN D) 1000 UNITS tablet Take 1,000 Units by mouth daily.   3 weeks  . desmopressin (DDAVP) 0.1 MG tablet Take 1 tablet (0.1 mg total) by mouth at bedtime. 30 tablet 0 06/05/2016 at Unknown time  . dexamethasone (DECADRON) 4 MG tablet Take 4 mg by mouth every 6 (six) hours.   06/05/2016 at Unknown time  . dicyclomine (BENTYL) 10 MG capsule Take 1 capsule (10 mg total)  by mouth 3 (three) times daily before meals. 60 capsule 0 06/05/2016 at Unknown time  . Diphenhyd-Hydrocort-Nystatin (FIRST-DUKES MOUTHWASH) SUSP 15 cc swish and spit QID until thrush resolves 237 mL 0 06/05/2016 at Unknown time  . fish oil-omega-3 fatty acids 1000 MG capsule Take 1 g by mouth daily.    3 weeks  . furosemide (LASIX) 20 MG tablet Take 1 tablet (20 mg total) by mouth daily. (Patient taking differently: Take 20 mg by mouth daily as needed for fluid. ) 30 tablet 0 unknown  . hydrOXYzine (VISTARIL) 50 MG capsule TK 1 C PO QID PRF NAUSEA  0 unknown  . LORazepam (ATIVAN) 0.5 MG tablet Take 1 tab PO Q 6 hours PRN  nausea or anxiety 30 tablet 0 06/05/2016 at Unknown time  . methocarbamol (ROBAXIN) 500 MG tablet TAKE 1 TO 2 TABLETS BY MOUTH EVERY 6 HOURS AS NEEDED FOR MUSCLE SPASMS 60 tablet 0 06/05/2016 at Unknown time  . Multiple Vitamin (MULTIVITAMIN WITH MINERALS) TABS Take 1 tablet by mouth daily.   3 weeks  . nystatin (MYCOSTATIN) 100000 UNIT/ML suspension Take 5 mLs (500,000 Units total) by mouth 4 (four) times daily. Until thrush resolves 60 mL 1 06/05/2016 at Unknown time  . ondansetron (ZOFRAN) 8 MG tablet Take 1 tablet (8 mg total) by mouth every 8 (eight) hours as needed for nausea or vomiting. 30 tablet 1 unknown  . oxyCODONE (ROXICODONE) 5 MG immediate release tablet Take 1-2 tabs PO Q 6 hours PRN pain. 30 tablet 0 a week  . oxyCODONE-acetaminophen (PERCOCET/ROXICET) 5-325 MG tablet Take 1 tablet by mouth every 4 (four) hours as needed. 30 tablet 0 a week  . prochlorperazine (COMPAZINE) 10 MG tablet Take 1 tablet (10 mg total) by mouth every 6 (six) hours as needed. (Patient taking differently: Take 10 mg by mouth every 6 (six) hours as needed for nausea or vomiting. ) 30 tablet 1 unknown  . temazepam (RESTORIL) 15 MG capsule Take 1 capsule (15 mg total) by mouth at bedtime as needed. (Patient taking differently: Take 15 mg by mouth at bedtime as needed for sleep. ) 60 capsule 0  06/05/2016 at Unknown time  . XARELTO 20 MG TABS tablet TAKE 1 TABLET BY MOUTH  DAILY 90 tablet 1 06/05/2016 at 0830  . pantoprazole (PROTONIX) 40 MG tablet TAKE 1 TABLET BY MOUTH TWO  TIMES DAILY 180 tablet 2 Taking  . Probiotic Product (ALIGN) 4 MG CAPS Take 1 capsule by mouth daily. Reported on 09/10/2015   3 weeks  . zolendronic acid (ZOMETA) 4 MG/5ML injection Inject 4 mg into the vein every 3 (three) months.    September    Results for orders placed or performed during the hospital encounter of 06/05/16 (from the past 48 hour(s))  CBC with Differential/Platelet     Status: Abnormal   Collection Time: 06/05/16 11:53 PM  Result Value Ref Range   WBC 9.6 4.0 - 10.5 K/uL   RBC 4.52 4.22 - 5.81 MIL/uL   Hemoglobin 14.7 13.0 - 17.0 g/dL   HCT 69.9 13.9 - 21.9 %   MCV 90.9 78.0 - 100.0 fL   MCH 32.5 26.0 - 34.0 pg   MCHC 35.8 30.0 - 36.0 g/dL   RDW 26.8 (H) 96.9 - 35.2 %   Platelets 181 150 - 400 K/uL   Neutrophils Relative % 89 %   Neutro Abs 8.5 (H) 1.7 - 7.7 K/uL   Lymphocytes Relative 5 %   Lymphs Abs 0.5 (L) 0.7 - 4.0 K/uL   Monocytes Relative 6 %   Monocytes Absolute 0.6 0.1 - 1.0 K/uL   Eosinophils Relative 0 %   Eosinophils Absolute 0.0 0.0 - 0.7 K/uL   Basophils Relative 0 %   Basophils Absolute 0.0 0.0 - 0.1 K/uL  Comprehensive metabolic panel     Status: Abnormal   Collection Time: 06/05/16 11:53 PM  Result Value Ref Range   Sodium 135 135 - 145 mmol/L   Potassium 2.9 (L) 3.5 - 5.1 mmol/L   Chloride 100 (L) 101 - 111 mmol/L   CO2 25 22 - 32 mmol/L   Glucose, Bld 119 (H) 65 - 99 mg/dL   BUN 11 6 - 20 mg/dL   Creatinine,  Ser 1.28 (H) 0.61 - 1.24 mg/dL   Calcium 10.1 8.9 - 10.3 mg/dL   Total Protein 7.0 6.5 - 8.1 g/dL   Albumin 4.1 3.5 - 5.0 g/dL   AST 22 15 - 41 U/L   ALT 34 17 - 63 U/L   Alkaline Phosphatase 58 38 - 126 U/L   Total Bilirubin 0.7 0.3 - 1.2 mg/dL   GFR calc non Af Amer 53 (L) >60 mL/min   GFR calc Af Amer >60 >60 mL/min    Comment: (NOTE) The  eGFR has been calculated using the CKD EPI equation. This calculation has not been validated in all clinical situations. eGFR's persistently <60 mL/min signify possible Chronic Kidney Disease.    Anion gap 10 5 - 15  Lipase, blood     Status: None   Collection Time: 06/05/16 11:53 PM  Result Value Ref Range   Lipase 42 11 - 51 U/L  Troponin I     Status: None   Collection Time: 06/05/16 11:53 PM  Result Value Ref Range   Troponin I <0.03 <0.03 ng/mL  Brain natriuretic peptide     Status: None   Collection Time: 06/05/16 11:54 PM  Result Value Ref Range   B Natriuretic Peptide 45.8 0.0 - 100.0 pg/mL  C difficile quick scan w PCR reflex     Status: None   Collection Time: 06/06/16  4:07 AM  Result Value Ref Range   C Diff antigen NEGATIVE NEGATIVE   C Diff toxin NEGATIVE NEGATIVE   C Diff interpretation No C. difficile detected.   Gastrointestinal Panel by PCR , Stool     Status: None   Collection Time: 06/06/16  4:07 AM  Result Value Ref Range   Campylobacter species NOT DETECTED NOT DETECTED   Plesimonas shigelloides NOT DETECTED NOT DETECTED   Salmonella species NOT DETECTED NOT DETECTED   Yersinia enterocolitica NOT DETECTED NOT DETECTED   Vibrio species NOT DETECTED NOT DETECTED   Vibrio cholerae NOT DETECTED NOT DETECTED   Enteroaggregative E coli (EAEC) NOT DETECTED NOT DETECTED   Enteropathogenic E coli (EPEC) NOT DETECTED NOT DETECTED   Enterotoxigenic E coli (ETEC) NOT DETECTED NOT DETECTED   Shiga like toxin producing E coli (STEC) NOT DETECTED NOT DETECTED   Shigella/Enteroinvasive E coli (EIEC) NOT DETECTED NOT DETECTED   Cryptosporidium NOT DETECTED NOT DETECTED   Cyclospora cayetanensis NOT DETECTED NOT DETECTED   Entamoeba histolytica NOT DETECTED NOT DETECTED   Giardia lamblia NOT DETECTED NOT DETECTED   Adenovirus F40/41 NOT DETECTED NOT DETECTED   Astrovirus NOT DETECTED NOT DETECTED   Norovirus GI/GII NOT DETECTED NOT DETECTED   Rotavirus A NOT DETECTED  NOT DETECTED   Sapovirus (I, II, IV, and V) NOT DETECTED NOT DETECTED  Urinalysis, Routine w reflex microscopic (not at [facility])     Status: Abnormal   Collection Time: 06/06/16  6:39 AM  Result Value Ref Range   Color, Urine YELLOW YELLOW   APPearance CLOUDY (A) CLEAR   Specific Gravity, Urine 1.021 1.005 - 1.030   pH 6.5 5.0 - 8.0   Glucose, UA NEGATIVE NEGATIVE mg/dL   Hgb urine dipstick TRACE (A) NEGATIVE   Bilirubin Urine NEGATIVE NEGATIVE   Ketones, ur NEGATIVE NEGATIVE mg/dL   Protein, ur NEGATIVE NEGATIVE mg/dL   Nitrite NEGATIVE NEGATIVE   Leukocytes, UA TRACE (A) NEGATIVE  Urine microscopic-add on     Status: Abnormal   Collection Time: 06/06/16  6:39 AM  Result Value Ref Range  Squamous Epithelial / LPF 0-5 (A) NONE SEEN   WBC, UA 0-5 0 - 5 WBC/hpf   RBC / HPF 0-5 0 - 5 RBC/hpf   Bacteria, UA RARE (A) NONE SEEN  Basic metabolic panel     Status: Abnormal   Collection Time: 06/06/16  8:33 AM  Result Value Ref Range   Sodium 136 135 - 145 mmol/L   Potassium 3.1 (L) 3.5 - 5.1 mmol/L   Chloride 102 101 - 111 mmol/L   CO2 24 22 - 32 mmol/L   Glucose, Bld 114 (H) 65 - 99 mg/dL   BUN 17 6 - 20 mg/dL   Creatinine, Ser 4.43 (H) 0.61 - 1.24 mg/dL   Calcium 9.3 8.9 - 66.7 mg/dL   GFR calc non Af Amer 41 (L) >60 mL/min   GFR calc Af Amer 48 (L) >60 mL/min    Comment: (NOTE) The eGFR has been calculated using the CKD EPI equation. This calculation has not been validated in all clinical situations. eGFR's persistently <60 mL/min signify possible Chronic Kidney Disease.    Anion gap 10 5 - 15  Magnesium     Status: Abnormal   Collection Time: 06/06/16  8:33 AM  Result Value Ref Range   Magnesium 1.6 (L) 1.7 - 2.4 mg/dL   *Note: Due to a large number of results and/or encounters for the requested time period, some results have not been displayed. A complete set of results can be found in Results Review.    Ct Abdomen Pelvis W Contrast  Result Date: 06/06/2016 CLINICAL  DATA:  Increasing abdominal pain for 3 days. Nausea and vomiting. White cell count 9.6. History of lung cancer, melanoma, multiple myeloma, diabetes. EXAM: CT ABDOMEN AND PELVIS WITH CONTRAST TECHNIQUE: Multidetector CT imaging of the abdomen and pelvis was performed using the standard protocol following bolus administration of intravenous contrast. CONTRAST:  100 mL Isovue-300 COMPARISON:  04/28/2016 FINDINGS: Lower chest: Atelectasis in the lung bases. Mildly distended fluid-filled esophagus may indicate reflux. Prominent lymph nodes noted in the inferior right hilum measuring up to 1.4 cm. These are smaller than on the previous study. Hepatobiliary: No focal liver abnormality is seen. No gallstones, gallbladder wall thickening, or biliary dilatation. Pancreas: Unremarkable. No pancreatic ductal dilatation or surrounding inflammatory changes. Spleen: Normal in size without focal abnormality. Adrenals/Urinary Tract: No adrenal gland nodules. Focal scarring in the left kidney. Renal nephrograms are symmetrical. No hydronephrosis or hydroureter. Bladder wall is not thickened. Stomach/Bowel: Moderately fluid distended stomach without wall thickening. Fluid-filled distended small bowel with transition zone in the right lower quadrant representing mid to distal ileum. At the transition zone, there is focal wall thickening and edema suggesting an inflammatory process. Fluid-filled nondistended colon. Vascular/Lymphatic: Aortic atherosclerosis. No enlarged abdominal or pelvic lymph nodes. Reproductive: Prostate gland is mildly enlarged, measuring 4.3 cm diameter. Other: No abdominal wall hernia or abnormality. No abdominopelvic ascites. Musculoskeletal: Degenerative changes throughout the lumbar spine. Diffuse bone demineralization. No destructive bone lesions. IMPRESSION: Small bowel obstruction with transition zone in the right lower quadrant, likely due to inflammatory stricture. Probable gastroesophageal reflux.  Fluid-filled nondistended colon. Right hilar lymphadenopathy is decreased since prior study. Electronically Signed   By: Burman Nieves M.D.   On: 06/06/2016 04:30   Dg Abd Acute W/chest  Result Date: 06/06/2016 CLINICAL DATA:  Nausea, vomiting, diarrhea, and all over abdominal pain for 5 days. Dizziness and lightheadedness. History of lung and brain cancer, last chemotherapy on Monday. Former smoker. EXAM: DG ABDOMEN ACUTE W/ 1V  CHEST COMPARISON:  05/25/2016 FINDINGS: Power port type central venous catheter on the right with tip over the cavoatrial junction region. No pneumothorax. Linear scarring in the lung bases with low lung volumes. Calcified and tortuous aorta. The gaseous distention of mid abdominal small bowel with air-fluid levels. Appearance suggests partial obstruction. Scattered stool seen in the colon. No colonic distention. No free intra-abdominal air. No radiopaque stones. Degenerative changes throughout the spine and in the hips and shoulders. IMPRESSION: Linear scarring in the lung bases similar to previous study. Gas-filled distended small bowel loops with air-fluid levels suggesting small bowel obstruction. No free air. Electronically Signed   By: Lucienne Capers M.D.   On: 06/06/2016 00:59    Review of Systems  Constitutional: Positive for weight loss. Negative for chills, diaphoresis, fever and malaise/fatigue.  HENT: Negative.   Eyes: Negative.   Respiratory: Negative.   Cardiovascular: Negative.   Gastrointestinal: Positive for abdominal pain, diarrhea, nausea and vomiting.  Genitourinary: Negative.   Musculoskeletal: Negative.   Skin: Negative.   Neurological: Positive for weakness and headaches (with radiation Rx.).  Endo/Heme/Allergies: Bruises/bleeds easily (on Xarelto last 2 years).  Psychiatric/Behavioral: Negative.    Blood pressure 101/72, pulse 86, temperature 98.2 F (36.8 C), temperature source Oral, resp. rate 12, height '5\' 9"'$  (1.753 m), weight 94.6 kg  (208 lb 8.9 oz), SpO2 98 %. Physical Exam  Constitutional: He is oriented to person, place, and time. He appears well-developed. No distress.  8 lb weight loss reported last 2 weeks  HENT:  Head: Normocephalic and atraumatic.  Prior fx right side of nose, with some deformity, unable to get the NG down.  Eyes: Right eye exhibits no discharge. Left eye exhibits no discharge. No scleral icterus.  Neck: Normal range of motion. Neck supple. No JVD present. No tracheal deviation present. No thyromegaly present.  Cardiovascular: Normal rate, regular rhythm, normal heart sounds and intact distal pulses.   No murmur heard. Respiratory: Effort normal and breath sounds normal. No respiratory distress. He has no wheezes. He has no rales. He exhibits no tenderness.  GI: Soft. He exhibits no distension and no mass. There is no tenderness. There is no rebound and no guarding.  He has some Bowel sounds  Genitourinary:  Genitourinary Comments: Urinary retention with relief with foley placement  Musculoskeletal: He exhibits no edema, tenderness or deformity.  Lymphadenopathy:    He has no cervical adenopathy.  Neurological: He is alert and oriented to person, place, and time. No cranial nerve deficit.  Skin: Skin is warm and dry. No rash noted. He is not diaphoretic. No erythema. No pallor.  Psychiatric: He has a normal mood and affect. His behavior is normal. Judgment and thought content normal.    Assessment/Plan: PSBO with inflammatory changes with thickening and edema distal ileum Negative C diff and GI panel today Hx of IBS, previously improved with Bentyl - currently ineffective  Urinary retention Mild renal insuffiencey Hypokalemia/hypomagnasemia Prior appendectomy and bilateral inguinal hernia repairs Stage IV B, non small cell lung cancer - mediastinal and brain metastasis - on steroids Stage IIA multiple myeloma  Hx of skin Melanoma Hx of PE/DVT - on anticoagulation since 02/07/14 -  Xarelto, last dose 06/05/16, 10:30 AM Weight loss FEN:  NPO ID: No antibiotics DVT:  Xarelto, last dose 06/05/16 10:30 AM   Plan:  Having episodes of nausea, vomiting and distension, followed by diarrhea for several weeks, worse last 5 days.  Will review with Dr. Excell Seltzer and recheck films and labs in  AM. If he vomits again suggest I you send him to Radiology and see if they can place NG. IV replacement of electrolytes.  Ask GI to see for recommendations.  This is some type of enteritis by CT.    Yue Glasheen 06/06/2016, 2:51 PM

## 2016-06-06 NOTE — Progress Notes (Signed)
Patient bladder scanned for 999 cc.  Foley placed per MD order

## 2016-06-07 ENCOUNTER — Inpatient Hospital Stay (HOSPITAL_COMMUNITY): Payer: Medicare Other

## 2016-06-07 ENCOUNTER — Ambulatory Visit: Payer: Self-pay

## 2016-06-07 ENCOUNTER — Other Ambulatory Visit: Payer: Self-pay

## 2016-06-07 DIAGNOSIS — R1084 Generalized abdominal pain: Secondary | ICD-10-CM

## 2016-06-07 DIAGNOSIS — E870 Hyperosmolality and hypernatremia: Secondary | ICD-10-CM

## 2016-06-07 DIAGNOSIS — R935 Abnormal findings on diagnostic imaging of other abdominal regions, including retroperitoneum: Secondary | ICD-10-CM

## 2016-06-07 LAB — BASIC METABOLIC PANEL
ANION GAP: 5 (ref 5–15)
ANION GAP: 6 (ref 5–15)
ANION GAP: 5 (ref 5–15)
BUN: 14 mg/dL (ref 6–20)
BUN: 16 mg/dL (ref 6–20)
BUN: 18 mg/dL (ref 6–20)
CALCIUM: 9.1 mg/dL (ref 8.9–10.3)
CALCIUM: 9.1 mg/dL (ref 8.9–10.3)
CALCIUM: 8.8 mg/dL — AB (ref 8.9–10.3)
CHLORIDE: 124 mmol/L — AB (ref 101–111)
CHLORIDE: 120 mmol/L — AB (ref 101–111)
CO2: 25 mmol/L (ref 22–32)
CO2: 24 mmol/L (ref 22–32)
CO2: 24 mmol/L (ref 22–32)
CREATININE: 1.18 mg/dL (ref 0.61–1.24)
CREATININE: 1.29 mg/dL — AB (ref 0.61–1.24)
CREATININE: 1.28 mg/dL — AB (ref 0.61–1.24)
Chloride: 121 mmol/L — ABNORMAL HIGH (ref 101–111)
GFR calc non Af Amer: 59 mL/min — ABNORMAL LOW (ref >60)
GFR calc non Af Amer: 53 mL/min — ABNORMAL LOW (ref >60)
GFR, EST NON AFRICAN AMERICAN: 53 mL/min — AB (ref >60)
Glucose, Bld: 138 mg/dL — ABNORMAL HIGH (ref 65–99)
Glucose, Bld: 133 mg/dL — ABNORMAL HIGH (ref 65–99)
Glucose, Bld: 158 mg/dL — ABNORMAL HIGH (ref 65–99)
Potassium: 4.7 mmol/L (ref 3.5–5.1)
Potassium: 4.3 mmol/L (ref 3.5–5.1)
Potassium: 4.4 mmol/L (ref 3.5–5.1)
SODIUM: 154 mmol/L — AB (ref 135–145)
SODIUM: 151 mmol/L — AB (ref 135–145)
SODIUM: 149 mmol/L — AB (ref 135–145)

## 2016-06-07 LAB — CBC
HCT: 38.5 % — ABNORMAL LOW (ref 39.0–52.0)
Hemoglobin: 12.9 g/dL — ABNORMAL LOW (ref 13.0–17.0)
MCH: 32.3 pg (ref 26.0–34.0)
MCHC: 33.5 g/dL (ref 30.0–36.0)
MCV: 96.3 fL (ref 78.0–100.0)
PLATELETS: 149 10*3/uL — AB (ref 150–400)
RBC: 4 MIL/uL — AB (ref 4.22–5.81)
RDW: 18.1 % — AB (ref 11.5–15.5)
WBC: 5.9 10*3/uL (ref 4.0–10.5)

## 2016-06-07 LAB — GLUCOSE, CAPILLARY
GLUCOSE-CAPILLARY: 161 mg/dL — AB (ref 65–99)
GLUCOSE-CAPILLARY: 138 mg/dL — AB (ref 65–99)
Glucose-Capillary: 105 mg/dL — ABNORMAL HIGH (ref 65–99)
Glucose-Capillary: 132 mg/dL — ABNORMAL HIGH (ref 65–99)

## 2016-06-07 LAB — HEPARIN LEVEL (UNFRACTIONATED)
HEPARIN UNFRACTIONATED: 0.79 [IU]/mL — AB (ref 0.30–0.70)
Heparin Unfractionated: <0.1 IU/mL — ABNORMAL LOW (ref 0.30–0.70)

## 2016-06-07 LAB — APTT: APTT: 22 s — AB (ref 24–36)

## 2016-06-07 LAB — MAGNESIUM: MAGNESIUM: 2.4 mg/dL (ref 1.7–2.4)

## 2016-06-07 LAB — PROTIME-INR
INR: 1.13
Prothrombin Time: 14.6 seconds (ref 11.4–15.2)

## 2016-06-07 MED ORDER — HEPARIN BOLUS VIA INFUSION
4000.0000 [IU] | Freq: Once | INTRAVENOUS | Status: AC
  Administered 2016-06-07: 4000 [IU] via INTRAVENOUS
  Filled 2016-06-07: qty 4000

## 2016-06-07 MED ORDER — HEPARIN (PORCINE) IN NACL 100-0.45 UNIT/ML-% IJ SOLN
1250.0000 [IU]/h | INTRAMUSCULAR | Status: DC
  Administered 2016-06-07: 1400 [IU]/h via INTRAVENOUS
  Administered 2016-06-08: 1250 [IU]/h via INTRAVENOUS
  Filled 2016-06-07 (×2): qty 250

## 2016-06-07 MED ORDER — HEPARIN (PORCINE) IN NACL 100-0.45 UNIT/ML-% IJ SOLN
1550.0000 [IU]/h | INTRAMUSCULAR | Status: DC
  Administered 2016-06-07: 1550 [IU]/h via INTRAVENOUS
  Filled 2016-06-07 (×2): qty 250

## 2016-06-07 MED ORDER — DESMOPRESSIN ACETATE 0.2 MG PO TABS: 0.2000 mg | ORAL_TABLET | Freq: Once | ORAL | Status: DC

## 2016-06-07 MED ORDER — DESMOPRESSIN ACETATE 4 MCG/ML IJ SOLN
0.5000 ug | Freq: Every day | INTRAMUSCULAR | Status: DC
  Administered 2016-06-08 – 2016-06-10 (×3): 0.52 ug via SUBCUTANEOUS
  Filled 2016-06-07 (×7): qty 0.13

## 2016-06-07 MED ORDER — DEXTROSE 5 % IV SOLN
INTRAVENOUS | Status: DC
  Administered 2016-06-07 – 2016-06-09 (×5): via INTRAVENOUS
  Administered 2016-06-09: 1000 mL via INTRAVENOUS
  Administered 2016-06-10: 07:00:00 via INTRAVENOUS

## 2016-06-07 MED ORDER — DESMOPRESSIN ACETATE 4 MCG/ML IJ SOLN
1.0000 ug | INTRAMUSCULAR | Status: AC
  Administered 2016-06-07: 1 ug via SUBCUTANEOUS
  Filled 2016-06-07: qty 0.25

## 2016-06-07 MED ORDER — LORAZEPAM 2 MG/ML IJ SOLN
0.5000 mg | Freq: Every evening | INTRAMUSCULAR | Status: DC | PRN
  Administered 2016-06-07 – 2016-06-09 (×3): 0.5 mg via INTRAVENOUS
  Filled 2016-06-07 (×3): qty 1

## 2016-06-07 NOTE — Progress Notes (Signed)
Patient stated that he had a grey watery bowel movement unobserved by this nurse.  Family at bedside stated patient had movement

## 2016-06-07 NOTE — Progress Notes (Signed)
ANTICOAGULATION CONSULT NOTE - Initial Consult  Pharmacy Consult for Heparin Indication: History of PE/DVT, Xarelto on hold  Allergies  Allergen Reactions  . Tramadol Palpitations    "Made my chest feel tight"  . Amoxicillin     REACTION: rash  . Penicillins Rash    Has patient had a PCN reaction causing immediate rash, facial/tongue/throat swelling, SOB or lightheadedness with hypotension: unknown Has patient had a PCN reaction causing severe rash involving mucus membranes or skin necrosis: unknown Has patient had a PCN reaction that required hospitalization: no  Has patient had a PCN reaction occurring within the last 10 years: no  If all of the above answers are "NO", then may proceed with Cephalosporin use.     Patient Measurements: Height: '5\' 9"'$  (175.3 cm) Weight: 208 lb 8.9 oz (94.6 kg) IBW/kg (Calculated) : 70.7 Heparin Dosing Weight: 90kg  Vital Signs: Temp: 97.6 F (36.4 C) (11/28 0559) Temp Source: Axillary (11/28 0559) BP: 104/82 (11/28 0559) Pulse Rate: 85 (11/28 0559)  Labs:  Recent Labs  06/05/16 2353 06/06/16 0833 06/07/16 0418  HGB 14.7  --  12.9*  HCT 41.1  --  38.5*  PLT 181  --  149*  CREATININE 1.28* 1.58* 1.18  TROPONINI <0.03  --   --     Estimated Creatinine Clearance: 61.4 mL/min (by C-G formula based on SCr of 1.18 mg/dL).   Medical History: Past Medical History:  Diagnosis Date  . Adenocarcinoma of right lung, stage 4 ([place]) 05/27/2016  . Adenomatous polyp of colon 1996  . Arthritis    Osteoarthritis right knee, spine, wrist  . Arthritis of hand, right    Right thumb (Dr. Daylene Katayama)  . Back ache    r/o Multiple Myeloma  . Benign prostatic hypertrophy    (Dr. Karsten Ro in past)  . Degenerative disc disease   . Diverticulosis   . DVT (deep venous thrombosis) ([place]) 01/2014   R calf  . Encounter for antineoplastic chemotherapy 10/20/2015  . Erosive esophagitis   . GERD (gastroesophageal reflux disease)   . Hemorrhoids   . Hiatal  hernia   . History of chronic prostatitis   . History of melanoma    Back (Dr. Wilhemina Bonito); early melanoma R arm 03/2011  . Multiple myeloma ([place]) 2013   skull lytic lesions--treated with radiation 07/2014  . Pulmonary embolism ([place]) 01/2014  . Radiation 07/21/14-08/05/14   left occipital parietal skull 25 gray  . SCC (squamous cell carcinoma) 08/04/15   right ear;dysplastic nevus with severe atypia-chest    Medications:  Scheduled:  . dexamethasone  4 mg Intravenous Q6H  . pantoprazole (PROTONIX) IV  40 mg Intravenous Q24H   Infusions:  . dextrose 100 mL/hr at 06/07/16 8588    Assessment: 68 yoM admitted on 11/27 with suspected SBO.  PMH includes chronic Xarelto anticoagulation for history of PE/DVT.  Xarelto was held on admission for NPO status (NG tube to suction) and possible surgical procedures.  Pharmacy is now consulted to dose Heparin IV.  PTA rivaroxaban '20mg'$  PO daily - last dose on 11/26 at 08:30.  Today, 06/07/2016:  Baseline coags pending: aPTT, INR, HL (> 48 hrs since last dose)  CBC: Hgb 12.9 is decreased, Plt 149 are decreased.  No bleeding or complications reported.  SCr is improving (1.28 > 1.58 > 1.18) with CrCl ~ 61 ml/min  Goal of Therapy:  Heparin level 0.3-0.7 units/ml aPTT 66-102 seconds Monitor platelets by anticoagulation protocol: Yes   Plan:   Baseline PTT, PT/INR,  heparin level  Heparin bolus 4000 units IV once  Start heparin IV infusion at 1550 units/hr  Heparin level 8 hours after starting  Daily heparin level and CBC  Continue to monitor H&H and platelets   Gretta Arab PharmD, BCPS Pager [phone] 06/07/2016 10:27 AM

## 2016-06-07 NOTE — Progress Notes (Signed)
ANTICOAGULATION CONSULT NOTE - Initial Consult  Pharmacy Consult for Heparin Indication: History of PE/DVT, Xarelto on hold  Allergies  Allergen Reactions  . Tramadol Palpitations    "Made my chest feel tight"  . Amoxicillin     REACTION: rash  . Penicillins Rash    Has patient had a PCN reaction causing immediate rash, facial/tongue/throat swelling, SOB or lightheadedness with hypotension: unknown Has patient had a PCN reaction causing severe rash involving mucus membranes or skin necrosis: unknown Has patient had a PCN reaction that required hospitalization: no  Has patient had a PCN reaction occurring within the last 10 years: no  If all of the above answers are "NO", then may proceed with Cephalosporin use.     Patient Measurements: Height: _0  (175.3 cm) Weight: 208 lb 8.9 oz (94.6 kg) IBW/kg (Calculated) : 70.7 Heparin Dosing Weight: 90kg  Vital Signs: Temp: 98.6 F (37 C) (11/28 1400) Temp Source: Oral (11/28 1000) BP: 126/82 (11/28 1400) Pulse Rate: 101 (11/28 1000)  Labs:  Recent Labs  06/05/16 2353  06/07/16 0418 06/07/16 1100 06/07/16 1255 06/07/16 1800 06/07/16 2130  HGB 14.7  --  12.9*  --   --   --   --   HCT 41.1  --  38.5*  --   --   --   --   PLT 181  --  149*  --   --   --   --   APTT  --   --   --  22*  --   --   --   LABPROT  --   --   --  14.6  --   --   --   INR  --   --   --  1.13  --   --   --   HEPARINUNFRC  --   --   --  <0.10*  --   --  0.79*  CREATININE 1.28*  < > 1.18  --  1.29* 1.28*  --   TROPONINI <0.03  --   --   --   --   --   --   < > = values in this interval not displayed.  Estimated Creatinine Clearance: 56.6 mL/min (by C-G formula based on SCr of 1.28 mg/dL (H)).   Medical History: Past Medical History:  Diagnosis Date  . Adenocarcinoma of right lung, stage 4 ([place]) 05/27/2016  . Adenomatous polyp of colon 1996  . Arthritis    Osteoarthritis right knee, spine, wrist  . Arthritis of hand, right    Right thumb (Dr.  Daylene Katayama)  . Back ache    r/o Multiple Myeloma  . Benign prostatic hypertrophy    (Dr. Karsten Ro in past)  . Degenerative disc disease   . Diverticulosis   . DVT (deep venous thrombosis) ([place]) 01/2014   R calf  . Encounter for antineoplastic chemotherapy 10/20/2015  . Erosive esophagitis   . GERD (gastroesophageal reflux disease)   . Hemorrhoids   . Hiatal hernia   . History of chronic prostatitis   . History of melanoma    Back (Dr. Wilhemina Bonito); early melanoma R arm 03/2011  . Multiple myeloma ([place]) 2013   skull lytic lesions--treated with radiation 07/2014  . Pulmonary embolism ([place]) 01/2014  . Radiation 07/21/14-08/05/14   left occipital parietal skull 25 gray  . SCC (squamous cell carcinoma) 08/04/15   right ear;dysplastic nevus with severe atypia-chest    Medications:  Scheduled:  . [START ON 06/08/2016] desmopressin  0.52 mcg Subcutaneous Daily  . dexamethasone  4 mg Intravenous Q6H  . pantoprazole (PROTONIX) IV  40 mg Intravenous Q24H   Infusions:  . dextrose 100 mL/hr at 06/07/16 0929  . heparin 1,550 Units/hr (06/07/16 1319)    Assessment: 75 yoM admitted on 11/27 with suspected SBO.  PMH includes chronic Xarelto anticoagulation for history of PE/DVT.  Xarelto was held on admission for NPO status (NG tube to suction) and possible surgical procedures.  Pharmacy is now consulted to dose Heparin IV.  PTA rivaroxaban 80m PO daily - last dose on 11/26 at 08:30.  Today, 06/07/2016:  Baseline coags pending: aPTT, INR, HL (> 48 hrs since last dose)  CBC: Hgb 12.9 is decreased, Plt 149 are decreased.  No bleeding or complications reported.  SCr is improving (1.28 > 1.58 > 1.18) with CrCl ~ 61 ml/min  PM Update: Heparin level = 0.79 with heparin infusing @ 1550 units/hr No complications noted  Goal of Therapy:  Heparin level 0.3-0.7 units/ml aPTT 66-102 seconds Monitor platelets by anticoagulation protocol: Yes   Plan:   Decrease heparin infusion to 1400  units/hr  Heparin level 8 hours after reducing dose  Daily heparin level and CBC  Continue to monitor H&H and platelets   LLeone Haven PharmD 06/07/2016 9:59 PM

## 2016-06-07 NOTE — Progress Notes (Signed)
PROGRESS NOTE    [name]  TOI:[id number] DOB: 1941-05-04 DOA: 06/05/2016 PCP: [name], MD   Brief Narrative: 75 y.o. male with a past medical history significant for metastatic lung cancer, PE on Xarelto who presents with intermittent abdominal pain and vomiting.CT of the abdomen showed small bowel obstruction with transition point near an area of "inflammatory process".Placement of NG tube failed, and TRH were asked to evaluate for admission for SBO.  (The patient was first diagnosed with cancer in 2013, with multiple myeloma. He underwent 2 rounds of Velcade, Revlimid, carfilzomib then had a BMT at [facility].  Then in 2017 he was diagnosed with brain metastases, and found to have a lung primary.  He has undergone skull radiation and is about to start [place] this Tuesday.)  Assessment & Plan:   # Small bowel obstruction: -Currently has NG tube for decompression, continue nothing by mouth, Pepcid IV. General surgery consult appreciated. -GI consult was called by surgery team. -Repeat abdominal x-ray of abdomen showed increased distention of the small bowel, slight worsening in small bowel obstruction compared to prior study. -Continue IV dilaudid and IV zofran -continue supportive care.  -Follow-up with Gen. surgery and GI for further plan and recommendation.  # Hypokalemia/hypomagnesemia: Improved. Monitor labs.   #hypernatremia: Patient reported that he was diagnosed with diabetes insipidus and takes DDAVP at night. He missed few doses and currently nothing by mouth, and this is probably the cause of hypernatremia. Discontinue normal saline and change to D5W. Ordered a dose of DDAVP this morning. Continue with daily dose of DDAVP in injectable home. I discussed the pharmacy. Repeat lab in the evening.  #Acute kidney injury likely due to dehydration and urinary retention: Patient had urinary retention yesterday with more than a liter of urine in bladder scan. Status post Foley  catheter insertion with increased urinary output. Continue IV fluid. Monitor BMP. Patient denied dysuria, urgency or frequency.  -Serum creatinine level 1.2 today.  #  History of pulmonary embolism: We will start IV heparin until final plan by surgery and GI. Holding xarelto.  #  Brain metastases ([name])   Adenocarcinoma of right lung, stage 4 ([place]): on decadron IV ,  PPI. Discussed with pharmacist.     DVT prophylaxis:SCD Code Status:full Family Communication:no Disposition Plan:likely dc home in 2-3 days depending on clinical improvement.  Consultants:   Surgery team  Procedures:none Antimicrobials:none  Subjective: The patient was seen and examined at bedside this morning.  He now has NG tube. Still has nausea and intermittent abdominal pain. No vomiting. Denied fever, chills, headache, dizziness, chest pain or shortness of breath.  Objective: Vitals:   06/06/16 1400 06/06/16 2202 06/07/16 0559 06/07/16 1000  BP: 100/64 115/64 104/82 (!) 129/99  Pulse: 91 87 85 (!) 101  Resp: '12 16 16 18  '$ Temp: 98.1 F (36.7 C) 98.3 F (36.8 C) 97.6 F (36.4 C) 98.6 F (37 C)  TempSrc: Oral Axillary Axillary Oral  SpO2: 98% 98% 95% 95%  Weight:      Height:        Intake/Output Summary (Last 24 hours) at 06/07/16 1350 Last data filed at 06/07/16 1100  Gross per 24 hour  Intake             2740 ml  Output             6375 ml  Net            -3635 ml   Filed Weights   06/06/16 0900  Weight: 94.6 kg (208 lb 8.9 oz)    Examination:  General exam: Pleasant male sitting comfortably, has NG tube with greenish secretion. Respiratory system: Clear bilateral, no wheezing or crackle Cardiovascular system: Regular rate rhythm, S1 and S2 normal. No pedal edema Gastrointestinal system: Abdomen soft, diffuse tenderness and mildly distended. Bowel sound sluggish. Central nervous system: Alert awake, oriented. No focal neurological deficit Extremities: Symmetric 5 x 5 power. Skin: No  rashes, lesions or ulcers Psychiatry: Judgement and insight appear normal. Mood & affect appropriate.     Data Reviewed: I have personally reviewed following labs and imaging studies  CBC:  Recent Labs Lab 06/05/16 2353 06/07/16 0418  WBC 9.6 5.9  NEUTROABS 8.5*  --   HGB 14.7 12.9*  HCT 41.1 38.5*  MCV 90.9 96.3  PLT 181 761*   Basic Metabolic Panel:  Recent Labs Lab 06/05/16 2353 06/06/16 0833 06/07/16 0418 06/07/16 1255  NA 135 136 154* 151*  K 2.9* 3.1* 4.7 4.3  CL 100* 102 124* 121*  CO2 '25 24 25 24  '$ GLUCOSE 119* 114* 138* 133*  BUN '11 17 14 16  '$ CREATININE 1.28* 1.58* 1.18 1.29*  CALCIUM 10.1 9.3 9.1 9.1  MG  --  1.6* 2.4  --    GFR: Estimated Creatinine Clearance: 56.2 mL/min (by C-G formula based on SCr of 1.29 mg/dL (H)). Liver Function Tests:  Recent Labs Lab 06/05/16 2353  AST 22  ALT 34  ALKPHOS 58  BILITOT 0.7  PROT 7.0  ALBUMIN 4.1    Recent Labs Lab 06/05/16 2353  LIPASE 42   No results for input(s): AMMONIA in the last 168 hours. Coagulation Profile:  Recent Labs Lab 06/07/16 1100  INR 1.13   Cardiac Enzymes:  Recent Labs Lab 06/05/16 2353  TROPONINI <0.03   BNP (last 3 results) No results for input(s): PROBNP in the last 8760 hours. HbA1C: No results for input(s): HGBA1C in the last 72 hours. CBG:  Recent Labs Lab 06/06/16 1756 06/06/16 2158 06/07/16 0743 06/07/16 1147  GLUCAP 136* 135* 105* 132*   Lipid Profile: No results for input(s): CHOL, HDL, LDLCALC, TRIG, CHOLHDL, LDLDIRECT in the last 72 hours. Thyroid Function Tests: No results for input(s): TSH, T4TOTAL, FREET4, T3FREE, THYROIDAB in the last 72 hours. Anemia Panel: No results for input(s): VITAMINB12, FOLATE, FERRITIN, TIBC, IRON, RETICCTPCT in the last 72 hours. Sepsis Labs: No results for input(s): PROCALCITON, LATICACIDVEN in the last 168 hours.  Recent Results (from the past 240 hour(s))  C difficile quick scan w PCR reflex     Status: None    Collection Time: 06/06/16  4:07 AM  Result Value Ref Range Status   C Diff antigen NEGATIVE NEGATIVE Final   C Diff toxin NEGATIVE NEGATIVE Final   C Diff interpretation No C. difficile detected.  Final  Gastrointestinal Panel by PCR , Stool     Status: None   Collection Time: 06/06/16  4:07 AM  Result Value Ref Range Status   Campylobacter species NOT DETECTED NOT DETECTED Final   Plesimonas shigelloides NOT DETECTED NOT DETECTED Final   Salmonella species NOT DETECTED NOT DETECTED Final   Yersinia enterocolitica NOT DETECTED NOT DETECTED Final   Vibrio species NOT DETECTED NOT DETECTED Final   Vibrio cholerae NOT DETECTED NOT DETECTED Final   Enteroaggregative E coli (EAEC) NOT DETECTED NOT DETECTED Final   Enteropathogenic E coli (EPEC) NOT DETECTED NOT DETECTED Final   Enterotoxigenic E coli (ETEC) NOT DETECTED NOT DETECTED Final   Shiga  like toxin producing E coli (STEC) NOT DETECTED NOT DETECTED Final   Shigella/Enteroinvasive E coli (EIEC) NOT DETECTED NOT DETECTED Final   Cryptosporidium NOT DETECTED NOT DETECTED Final   Cyclospora cayetanensis NOT DETECTED NOT DETECTED Final   Entamoeba histolytica NOT DETECTED NOT DETECTED Final   Giardia lamblia NOT DETECTED NOT DETECTED Final   Adenovirus F40/41 NOT DETECTED NOT DETECTED Final   Astrovirus NOT DETECTED NOT DETECTED Final   Norovirus GI/GII NOT DETECTED NOT DETECTED Final   Rotavirus A NOT DETECTED NOT DETECTED Final   Sapovirus (I, II, IV, and V) NOT DETECTED NOT DETECTED Final         Radiology Studies: Ct Abdomen Pelvis W Contrast  Result Date: 06/06/2016 CLINICAL DATA:  Increasing abdominal pain for 3 days. Nausea and vomiting. White cell count 9.6. History of lung cancer, melanoma, multiple myeloma, diabetes. EXAM: CT ABDOMEN AND PELVIS WITH CONTRAST TECHNIQUE: Multidetector CT imaging of the abdomen and pelvis was performed using the standard protocol following bolus administration of intravenous contrast.  CONTRAST:  100 mL Isovue-300 COMPARISON:  04/28/2016 FINDINGS: Lower chest: Atelectasis in the lung bases. Mildly distended fluid-filled esophagus may indicate reflux. Prominent lymph nodes noted in the inferior right hilum measuring up to 1.4 cm. These are smaller than on the previous study. Hepatobiliary: No focal liver abnormality is seen. No gallstones, gallbladder wall thickening, or biliary dilatation. Pancreas: Unremarkable. No pancreatic ductal dilatation or surrounding inflammatory changes. Spleen: Normal in size without focal abnormality. Adrenals/Urinary Tract: No adrenal gland nodules. Focal scarring in the left kidney. Renal nephrograms are symmetrical. No hydronephrosis or hydroureter. Bladder wall is not thickened. Stomach/Bowel: Moderately fluid distended stomach without wall thickening. Fluid-filled distended small bowel with transition zone in the right lower quadrant representing mid to distal ileum. At the transition zone, there is focal wall thickening and edema suggesting an inflammatory process. Fluid-filled nondistended colon. Vascular/Lymphatic: Aortic atherosclerosis. No enlarged abdominal or pelvic lymph nodes. Reproductive: Prostate gland is mildly enlarged, measuring 4.3 cm diameter. Other: No abdominal wall hernia or abnormality. No abdominopelvic ascites. Musculoskeletal: Degenerative changes throughout the lumbar spine. Diffuse bone demineralization. No destructive bone lesions. IMPRESSION: Small bowel obstruction with transition zone in the right lower quadrant, likely due to inflammatory stricture. Probable gastroesophageal reflux. Fluid-filled nondistended colon. Right hilar lymphadenopathy is decreased since prior study. Electronically Signed   By: Lucienne Capers M.D.   On: 06/06/2016 04:30   Dg Abd 2 Views  Result Date: 06/07/2016 CLINICAL DATA:  Abdominal distention. Follow-up small bowel obstruction EXAM: ABDOMEN - 2 VIEW COMPARISON:  06/06/2016 FINDINGS: Increasing  small bowel dilatation since prior study. Findings concerning for worsening small bowel obstruction. Gas also noted within the colon. No free air organomegaly. NG tube tip is in the proximal stomach with the side port in the distal esophagus. IMPRESSION: Increasing gaseous distention of small bowel loops with scattered air-fluid levels. Findings compatible with slight worsening in small bowel obstruction since prior study. Electronically Signed   By: Rolm Baptise M.D.   On: 06/07/2016 11:26   Dg Abd Acute W/chest  Result Date: 06/06/2016 CLINICAL DATA:  Nausea, vomiting, diarrhea, and all over abdominal pain for 5 days. Dizziness and lightheadedness. History of lung and brain cancer, last chemotherapy on Monday. Former smoker. EXAM: DG ABDOMEN ACUTE W/ 1V CHEST COMPARISON:  05/25/2016 FINDINGS: Power port type central venous catheter on the right with tip over the cavoatrial junction region. No pneumothorax. Linear scarring in the lung bases with low lung volumes. Calcified and tortuous aorta.  The gaseous distention of mid abdominal small bowel with air-fluid levels. Appearance suggests partial obstruction. Scattered stool seen in the colon. No colonic distention. No free intra-abdominal air. No radiopaque stones. Degenerative changes throughout the spine and in the hips and shoulders. IMPRESSION: Linear scarring in the lung bases similar to previous study. Gas-filled distended small bowel loops with air-fluid levels suggesting small bowel obstruction. No free air. Electronically Signed   By: Lucienne Capers M.D.   On: 06/06/2016 00:59        Scheduled Meds: . [START ON 06/08/2016] desmopressin  0.52 mcg Subcutaneous Daily  . dexamethasone  4 mg Intravenous Q6H  . pantoprazole (PROTONIX) IV  40 mg Intravenous Q24H   Continuous Infusions: . dextrose 100 mL/hr at 06/07/16 0929  . heparin 1,550 Units/hr (06/07/16 1319)     LOS: 1 day   Oswin Johal Tanna Furry, MD [facility] Pager  [phone]  If 7PM-7AM, please contact night-coverage [email or web address] Password [email or web address] 06/07/2016, 1:50 PM

## 2016-06-07 NOTE — Consult Note (Signed)
Referring Provider: [facility] Surgery Primary Care Physician:  [name], MD Primary Gastroenterologist:  Lucio Edward, MD  Reason for Consultation:  Ileitis / SBO  HPI: [name] is a 75 y.o. male known to [place] for a history of GERD, IBS and adenomatous colon polyps.  He has a history of multiple myeloma, s/p stem cell transplant, chemotherapy. He was recently diagnosed with stage IV non-small cell lung cancer with brain mets.. He has been on high dose steroids and undergoing stereotactic radiotherapy to brain lesions. Patient followed by Dr. [name], he was to start [name] today . Patient admitted through ED yesterday with nausea, vomiting, abdominal pain and distention. CTscan c/w SBO with transition point at in RLQ where there is area of focal inflammation  Patient gets diarrhea with IBS but Bentyl usually helps. He was started on Decadron mid October, around time brain mets found. Patient states he began having severe diarrhea, diffuse abdominal pain and vomiting around the time he started steroids and began brain radiation.  Dicyclomine hasn't helped. The diarrhea is watery, numerous times a day. Symptoms have been almost daily since onset over a month ago. He was recently diagnosed with diabetes insipidus  Past Medical History:  Diagnosis Date  . Adenocarcinoma of right lung, stage 4 ([place]) 05/27/2016  . Adenomatous polyp of colon 1996  . Arthritis    Osteoarthritis right knee, spine, wrist  . Arthritis of hand, right    Right thumb (Dr. Daylene Katayama)  . Back ache    r/o Multiple Myeloma  . Benign prostatic hypertrophy    (Dr. Karsten Ro in past)  . Degenerative disc disease   . Diverticulosis   . DVT (deep venous thrombosis) ([place]) 01/2014   R calf  . Encounter for antineoplastic chemotherapy 10/20/2015  . Erosive esophagitis   . GERD (gastroesophageal reflux disease)   . Hemorrhoids   . Hiatal hernia   . History of chronic prostatitis   . History of melanoma    Back  (Dr. Wilhemina Bonito); early melanoma R arm 03/2011  . Multiple myeloma ([place]) 2013   skull lytic lesions--treated with radiation 07/2014  . Pulmonary embolism ([place]) 01/2014  . Radiation 07/21/14-08/05/14   left occipital parietal skull 25 gray  . SCC (squamous cell carcinoma) 08/04/15   right ear;dysplastic nevus with severe atypia-chest    Past Surgical History:  Procedure Laterality Date  . APPENDECTOMY    . BONE MARROW TRANSPLANT  03/2013   Stem Cell  . COLONOSCOPY  2009   adenomatous polyp  . ESOPHAGOGASTRODUODENOSCOPY  2009   mild inflammation at esophagogastric junction  . excision of melanoma     back ( x3)  . Great toe surgery     bilateral  . INGUINAL HERNIA REPAIR     left, x 2  . port placemet  10/2012  . SCALENE NODE BIOPSY Right 05/11/2016   Procedure: BIOPSY RIGHT SCALENE NODE;  Surgeon: Grace Isaac, MD;  Location: [place];  Service: Thoracic;  Laterality: Right;  . TOTAL KNEE ARTHROPLASTY  06/08/2011   Procedure: TOTAL KNEE ARTHROPLASTY;  Surgeon: Ninetta Lights, MD;  Location: [place];  Service: Orthopedics;  Laterality: Right;  osteonics  . total knee replaced  2000   Left knee    Prior to Admission medications   Medication Sig Start Date End Date Taking? Authorizing Provider  acetaminophen (TYLENOL) 500 MG tablet Take 500 mg by mouth every 6 (six) hours as needed for moderate pain or headache.   Yes Historical  Provider, MD  ascorbic acid (VITAMIN C) 500 MG tablet Take 500 mg by mouth 2 (two) times daily.    Yes Historical Provider, MD  cholecalciferol (VITAMIN D) 1000 UNITS tablet Take 1,000 Units by mouth daily.   Yes Historical Provider, MD  desmopressin (DDAVP) 0.1 MG tablet Take 1 tablet (0.1 mg total) by mouth at bedtime. 04/25/16  Yes Rita Ohara, MD  dexamethasone (DECADRON) 4 MG tablet Take 4 mg by mouth every 6 (six) hours.   Yes Historical Provider, MD  dicyclomine (BENTYL) 10 MG capsule Take 1 capsule (10 mg total) by mouth 3 (three) times daily before meals.  06/01/16  Yes Rita Ohara, MD  Diphenhyd-Hydrocort-Nystatin (FIRST-DUKES MOUTHWASH) SUSP 15 cc swish and spit QID until thrush resolves 05/30/16  Yes Hayden Pedro, PA-C  fish oil-omega-3 fatty acids 1000 MG capsule Take 1 g by mouth daily.    Yes Historical Provider, MD  furosemide (LASIX) 20 MG tablet Take 1 tablet (20 mg total) by mouth daily. Patient taking differently: Take 20 mg by mouth daily as needed for fluid.  02/29/16  Yes Rita Ohara, MD  hydrOXYzine (VISTARIL) 50 MG capsule TK 1 C PO QID PRF NAUSEA 05/17/16  Yes Historical Provider, MD  LORazepam (ATIVAN) 0.5 MG tablet Take 1 tab PO Q 6 hours PRN nausea or anxiety 05/18/16  Yes Susanne Borders, NP  methocarbamol (ROBAXIN) 500 MG tablet TAKE 1 TO 2 TABLETS BY MOUTH EVERY 6 HOURS AS NEEDED FOR MUSCLE SPASMS 12/09/15  Yes Rita Ohara, MD  Multiple Vitamin (MULTIVITAMIN WITH MINERALS) TABS Take 1 tablet by mouth daily.   Yes Historical Provider, MD  nystatin (MYCOSTATIN) 100000 UNIT/ML suspension Take 5 mLs (500,000 Units total) by mouth 4 (four) times daily. Until thrush resolves 05/31/16  Yes Hayden Pedro, PA-C  ondansetron (ZOFRAN) 8 MG tablet Take 1 tablet (8 mg total) by mouth every 8 (eight) hours as needed for nausea or vomiting. 05/18/16  Yes Susanne Borders, NP  oxyCODONE (ROXICODONE) 5 MG immediate release tablet Take 1-2 tabs PO Q 6 hours PRN pain. 05/18/16  Yes Susanne Borders, NP  oxyCODONE-acetaminophen (PERCOCET/ROXICET) 5-325 MG tablet Take 1 tablet by mouth every 4 (four) hours as needed. 03/29/16  Yes Curt Bears, MD  prochlorperazine (COMPAZINE) 10 MG tablet Take 1 tablet (10 mg total) by mouth every 6 (six) hours as needed. Patient taking differently: Take 10 mg by mouth every 6 (six) hours as needed for nausea or vomiting.  04/29/16  Yes Curt Bears, MD  temazepam (RESTORIL) 15 MG capsule Take 1 capsule (15 mg total) by mouth at bedtime as needed. Patient taking differently: Take 15 mg by mouth at bedtime as  needed for sleep.  04/08/16  Yes Curt Bears, MD  XARELTO 20 MG TABS tablet TAKE 1 TABLET BY MOUTH  DAILY 05/21/16  Yes Curt Bears, MD  pantoprazole (PROTONIX) 40 MG tablet TAKE 1 TABLET BY MOUTH TWO  TIMES DAILY 05/21/16   Curt Bears, MD  Probiotic Product (ALIGN) 4 MG CAPS Take 1 capsule by mouth daily. Reported on 09/10/2015    Historical Provider, MD  zolendronic acid (ZOMETA) 4 MG/5ML injection Inject 4 mg into the vein every 3 (three) months.     Historical Provider, MD    Current Facility-Administered Medications  Medication Dose Route Frequency Provider Last Rate Last Dose  . [START ON 06/08/2016] desmopressin (DDAVP) injection 0.52 mcg  0.52 mcg Subcutaneous Daily Dron Tanna Furry, MD      . dexamethasone (  DECADRON) injection 4 mg  4 mg Intravenous Q6H Dron Tanna Furry, MD   4 mg at 06/07/16 1156  . dextrose 5 % solution   Intravenous Continuous Dron Tanna Furry, MD 100 mL/hr at 06/07/16 0929    . heparin ADULT infusion 100 units/mL (25000 units/28m sodium chloride 0.45%)  1,550 Units/hr Intravenous Continuous Christine E Shade, RPH      . heparin bolus via infusion 4,000 Units  4,000 Units Intravenous Once CRanda Spike RPH      . HYDROmorphone (DILAUDID) injection 1 mg  1 mg Intravenous Q1H PRN PVolanda Napoleon MD   1 mg at 06/07/16 1124  . lidocaine-prilocaine (EMLA) cream   Topical Daily PRN Dron PTanna Furry MD      . LORazepam (ATIVAN) injection 0.5 mg  0.5 mg Intravenous QHS PRN Dron PTanna Furry MD      . ondansetron ([facility] tablet 4 mg  4 mg Oral Q6H PRN CEdwin Dada MD       Or  . ondansetron (ZOFRAN) injection 4 mg  4 mg Intravenous Q6H PRN CEdwin Dada MD      . pantoprazole (PROTONIX) injection 40 mg  40 mg Intravenous Q24H Dron PTanna Furry MD   40 mg at 06/07/16 1156  . sodium chloride flush (NS) 0.9 % injection 10-40 mL  10-40 mL Intracatheter PRN Dron PTanna Furry MD   10 mL at 06/07/16 0428  . sodium  chloride flush (NS) 0.9 % injection 10-40 mL  10-40 mL Intracatheter PRN Caren TLuella Cook MD       Facility-Administered Medications Ordered in Other Encounters  Medication Dose Route Frequency Provider Last Rate Last Dose  . ondansetron (ZOFRAN) tablet 8 mg  8 mg Oral Once MCurt Bears MD        Allergies as of 06/05/2016 - Review Complete 06/05/2016  Allergen Reaction Noted  . Tramadol Palpitations 05/09/2016  . Amoxicillin  12/14/2012  . Penicillins Rash 11/09/2007    Family History  Problem Relation Age of Onset  . Dementia Mother   . Stroke Father     brainstem stroke  . Hypertension Father   . Diabetes Father   . Prostate cancer Maternal Grandfather   . Depression Daughter   . Anesthesia problems Neg Hx     Social History   Social History  . Marital status: Married    Spouse name: N/A  . Number of children: 2  . Years of education: N/A   Occupational History  . 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 Smoking status: Former Smoker    Packs/day: 3.00    Years: 10.00    Types: Cigarettes    Quit date: 07/11/1976  . Smokeless tobacco: Never Used     Comment: 3 PPD x 6 years  . Alcohol use 1.5 oz/week    3 Standard drinks or equivalent per week     Comment: 2 glasses of wine daily  . Drug use: No  . Sexual activity: Yes    Partners: Female   Other Topics Concern  . Not on file   Social History Narrative   Married.  2 [place] 4 granddaughters    Review of Systems: All systems reviewed and negative except where noted in HPI.  Physical Exam: Vital signs in last 24 hours: Temp:  [97.6 F (36.4 C)-98.6 F (37 C)] 98.6 F (37 C) (11/28 1000) Pulse Rate:  [85-101] 101 (11/28 1000)  Resp:  [12-18] 18 (11/28 1000) BP: (100-129)/(64-99) 129/99 (11/28 1000) SpO2:  [95 %-98 %] 95 % (11/28 1000) Last BM Date: 06/06/16 General:   Alert,  Well-developed, well-nourished, pleasant and  cooperative white male in NAD Head:  Normocephalic and atraumatic. Eyes:  Sclera clear, no icterus.   Conjunctiva pink. Ears:  Normal auditory acuity. Nose:  No deformity, discharge,  or lesions. Mouth:  No deformity or lesions.   Neck:  Supple; no masses Lungs:  Clear throughout to auscultation.   No wheezes, crackles, or rhonchi.  Heart:  Regular rate and rhythm; no murmurs, clicks, rubs,  or gallops. Abdomen:  Soft, moderately distended, mild diffuse tenderness. NGT suction sounds.  Rectal:  Deferred  Msk:  Symmetrical without gross deformities. . Pulses:  Normal pulses noted. Extremities:  Without clubbing or edema. Neurologic:  Alert and  oriented x4;  grossly normal neurologically. Skin:  Intact without significant lesions or rashes.. Psych:  Alert and cooperative. Normal mood and affect.  Intake/Output from previous day: 11/27 0701 - 11/28 0700 In: 2562.5 [I.V.:2562.5] Out: 7902 [Urine:5150] Intake/Output this shift: Total I/O In: 240 [P.O.:240] Out: 1225 [Urine:625; Emesis/NG output:600]  Lab Results:  Recent Labs  06/05/16 2353 06/07/16 0418  WBC 9.6 5.9  HGB 14.7 12.9*  HCT 41.1 38.5*  PLT 181 149*   BMET  Recent Labs  06/05/16 2353 06/06/16 0833 06/07/16 0418  NA 135 136 154*  K 2.9* 3.1* 4.7  CL 100* 102 124*  CO2 '25 24 25  '$ GLUCOSE 119* 114* 138*  BUN '11 17 14  '$ CREATININE 1.28* 1.58* 1.18  CALCIUM 10.1 9.3 9.1   LFT  Recent Labs  06/05/16 2353  PROT 7.0  ALBUMIN 4.1  AST 22  ALT 34  ALKPHOS 58  BILITOT 0.7   PT/INR No results for input(s): LABPROT, INR in the last 72 hours. Hepatitis Panel No results for input(s): HEPBSAG, HCVAB, HEPAIGM, HEPBIGM in the last 72 hours.    Studies/Results: Ct Abdomen Pelvis W Contrast  Result Date: 06/06/2016 CLINICAL DATA:  Increasing abdominal pain for 3 days. Nausea and vomiting. White cell count 9.6. History of lung cancer, melanoma, multiple myeloma, diabetes. EXAM: CT ABDOMEN AND PELVIS WITH  CONTRAST TECHNIQUE: Multidetector CT imaging of the abdomen and pelvis was performed using the standard protocol following bolus administration of intravenous contrast. CONTRAST:  100 mL Isovue-300 COMPARISON:  04/28/2016 FINDINGS: Lower chest: Atelectasis in the lung bases. Mildly distended fluid-filled esophagus may indicate reflux. Prominent lymph nodes noted in the inferior right hilum measuring up to 1.4 cm. These are smaller than on the previous study. Hepatobiliary: No focal liver abnormality is seen. No gallstones, gallbladder wall thickening, or biliary dilatation. Pancreas: Unremarkable. No pancreatic ductal dilatation or surrounding inflammatory changes. Spleen: Normal in size without focal abnormality. Adrenals/Urinary Tract: No adrenal gland nodules. Focal scarring in the left kidney. Renal nephrograms are symmetrical. No hydronephrosis or hydroureter. Bladder wall is not thickened. Stomach/Bowel: Moderately fluid distended stomach without wall thickening. Fluid-filled distended small bowel with transition zone in the right lower quadrant representing mid to distal ileum. At the transition zone, there is focal wall thickening and edema suggesting an inflammatory process. Fluid-filled nondistended colon. Vascular/Lymphatic: Aortic atherosclerosis. No enlarged abdominal or pelvic lymph nodes. Reproductive: Prostate gland is mildly enlarged, measuring 4.3 cm diameter. Other: No abdominal wall hernia or abnormality. No abdominopelvic ascites. Musculoskeletal: Degenerative changes throughout the lumbar spine. Diffuse bone demineralization. No destructive bone lesions. IMPRESSION: Small bowel obstruction with transition zone in the right lower  quadrant, likely due to inflammatory stricture. Probable gastroesophageal reflux. Fluid-filled nondistended colon. Right hilar lymphadenopathy is decreased since prior study. Electronically Signed   By: Lucienne Capers M.D.   On: 06/06/2016 04:30   Dg Abd 2  Views  Result Date: 06/07/2016 CLINICAL DATA:  Abdominal distention. Follow-up small bowel obstruction EXAM: ABDOMEN - 2 VIEW COMPARISON:  06/06/2016 FINDINGS: Increasing small bowel dilatation since prior study. Findings concerning for worsening small bowel obstruction. Gas also noted within the colon. No free air organomegaly. NG tube tip is in the proximal stomach with the side port in the distal esophagus. IMPRESSION: Increasing gaseous distention of small bowel loops with scattered air-fluid levels. Findings compatible with slight worsening in small bowel obstruction since prior study. Electronically Signed   By: Rolm Baptise M.D.   On: 06/07/2016 11:26   Dg Abd Acute W/chest  Result Date: 06/06/2016 CLINICAL DATA:  Nausea, vomiting, diarrhea, and all over abdominal pain for 5 days. Dizziness and lightheadedness. History of lung and brain cancer, last chemotherapy on Monday. Former smoker. EXAM: DG ABDOMEN ACUTE W/ 1V CHEST COMPARISON:  05/25/2016 FINDINGS: Power port type central venous catheter on the right with tip over the cavoatrial junction region. No pneumothorax. Linear scarring in the lung bases with low lung volumes. Calcified and tortuous aorta. The gaseous distention of mid abdominal small bowel with air-fluid levels. Appearance suggests partial obstruction. Scattered stool seen in the colon. No colonic distention. No free intra-abdominal air. No radiopaque stones. Degenerative changes throughout the spine and in the hips and shoulders. IMPRESSION: Linear scarring in the lung bases similar to previous study. Gas-filled distended small bowel loops with air-fluid levels suggesting small bowel obstruction. No free air. Electronically Signed   By: Lucienne Capers M.D.   On: 06/06/2016 00:59    IMPRESSION / PLAN:   42. 75 year old male with severe diarrhea, nausea, vomiting and abdominal pain with CTscan suggesting partial SBO / focal ileitis. Today's 2 view films showed worsening SBO.   Onset of symptoms not clear but patient states it has been ongoing for several weeks. He is immunosuppressed but wbc normal and focal nature makes typhlitis unlikely. Negative GI pathogen panel.  Hx of appendectomy, ? Could this be adhesions.   -continue supportive care for now, repeat 2 view film in am.  -Dr. Henrene Pastor to review films with Radiologist  2. Stage IV non-small cell lung cancer. He has been undergoing stereotactic radiation to brain lesions. Was supposed to start [name] today but given acute issues that is on hold.   3. DVT, Xarelto on hold. Currently on heparin gtt.   4. History of adenomatous colon polyps (last occurrence was with last colonoscopy in 2009). Patient didn't get recommended surveillance colonoscopy as he was diagnosed with a DVT and concerned about interupting anticoagulation. Seen in 2016 for IBS type symptoms, declined colonoscopy at that time as well.  5. AKI, getting maintenance IVF  6. Diabetes insipidus, recently started on desmopressin  Tye Savoy  06/07/2016, 12:07 PM Pager number [phone]  GI ATTENDING  History, laboratories, x-rays reviewed. Patient personally seen and examined. Agree with comprehensive consultation note as outlined above. The patient presents with symptomatic small bowel obstruction. I reviewed the x-rays with Dr. Jobe Igo. As well, recent CT and CT PET. The most recent CT scan shows normal-appearing most distal ileum at the ileocecal valve. Above that is an area of decompressed bowel in the distal ileum that was said to be "thickened". Dr. Jobe Igo feels that this is under  distended and not pathologically thickened. No surrounding inflammatory changes present. The bowel above this transition point is dilated. On balance, the picture is most consistent with mechanical small bowel obstruction, possibly adhesions. Unlikely to be cancer or inflammatory bowel disease based on history and recent imaging. Not characteristic of infection but if  so would expect improvement with conservative measures. Distention is somewhat worse on on x-ray today. Patient remains distended and uncomfortable with NG tube in, but stable. This may be adhesions or some other mechanical process. Agree with NG suction, IV fluids, pain control, and close follow-up. Will need exploratory laparoscopy/laparotomy if he worsens clinically or the problem does not resolve. Continue to monitor closely clinically as well as radiographically. Discussed with patient, his family, and Gen. surgery Dr. Excell Seltzer.  Docia Chuck. Geri Seminole., M.D. [facility] Division of Gastroenterology

## 2016-06-07 NOTE — Progress Notes (Signed)
Subjective: NG placed and at least 1.5 canisters filled from the NG.  Unfortunately we do not have that recorded.  He is dying to have some ice chips or something, NG drainage is green. He does feel better this Am  Objective: Vital signs in last 24 hours: Temp:  [97.6 F (36.4 C)-98.3 F (36.8 C)] 97.6 F (36.4 C) (11/28 0559) Pulse Rate:  [85-91] 85 (11/28 0559) Resp:  [12-16] 16 (11/28 0559) BP: (100-115)/(64-82) 104/82 (11/28 0559) SpO2:  [95 %-98 %] 95 % (11/28 0559) Last BM Date: 06/06/16 NPO 2600 IV 5150 urine Negative 2.5 liters on I/O  Na 154 this Am Afebrile, VSS  Na 154 creatinine is better with hydration K+ is better, WBC is 5.9  Intake/Output from previous day: 11/27 0701 - 11/28 0700 In: 2562.5 [I.V.:2562.5] Out: 0539 [Urine:5150] Intake/Output this shift: No intake/output data recorded.  General appearance: alert, cooperative and no distress GI: soft, BS hyperactive, no abdominal discomfort, sitting up to bathroom sink currently.  Lab Results:   Recent Labs  06/05/16 2353 06/07/16 0418  WBC 9.6 5.9  HGB 14.7 12.9*  HCT 41.1 38.5*  PLT 181 149*    BMET  Recent Labs  06/06/16 0833 06/07/16 0418  NA 136 154*  K 3.1* 4.7  CL 102 124*  CO2 24 25  GLUCOSE 114* 138*  BUN 17 14  CREATININE 1.58* 1.18  CALCIUM 9.3 9.1   PT/INR No results for input(s): LABPROT, INR in the last 72 hours.   Recent Labs Lab 06/05/16 2353  AST 22  ALT 34  ALKPHOS 58  BILITOT 0.7  PROT 7.0  ALBUMIN 4.1     Lipase     Component Value Date/Time   LIPASE 42 06/05/2016 2353     Studies/Results: Ct Abdomen Pelvis W Contrast  Result Date: 06/06/2016 CLINICAL DATA:  Increasing abdominal pain for 3 days. Nausea and vomiting. White cell count 9.6. History of lung cancer, melanoma, multiple myeloma, diabetes. EXAM: CT ABDOMEN AND PELVIS WITH CONTRAST TECHNIQUE: Multidetector CT imaging of the abdomen and pelvis was performed using the standard protocol  following bolus administration of intravenous contrast. CONTRAST:  100 mL Isovue-300 COMPARISON:  04/28/2016 FINDINGS: Lower chest: Atelectasis in the lung bases. Mildly distended fluid-filled esophagus may indicate reflux. Prominent lymph nodes noted in the inferior right hilum measuring up to 1.4 cm. These are smaller than on the previous study. Hepatobiliary: No focal liver abnormality is seen. No gallstones, gallbladder wall thickening, or biliary dilatation. Pancreas: Unremarkable. No pancreatic ductal dilatation or surrounding inflammatory changes. Spleen: Normal in size without focal abnormality. Adrenals/Urinary Tract: No adrenal gland nodules. Focal scarring in the left kidney. Renal nephrograms are symmetrical. No hydronephrosis or hydroureter. Bladder wall is not thickened. Stomach/Bowel: Moderately fluid distended stomach without wall thickening. Fluid-filled distended small bowel with transition zone in the right lower quadrant representing mid to distal ileum. At the transition zone, there is focal wall thickening and edema suggesting an inflammatory process. Fluid-filled nondistended colon. Vascular/Lymphatic: Aortic atherosclerosis. No enlarged abdominal or pelvic lymph nodes. Reproductive: Prostate gland is mildly enlarged, measuring 4.3 cm diameter. Other: No abdominal wall hernia or abnormality. No abdominopelvic ascites. Musculoskeletal: Degenerative changes throughout the lumbar spine. Diffuse bone demineralization. No destructive bone lesions. IMPRESSION: Small bowel obstruction with transition zone in the right lower quadrant, likely due to inflammatory stricture. Probable gastroesophageal reflux. Fluid-filled nondistended colon. Right hilar lymphadenopathy is decreased since prior study. Electronically Signed   By: Lucienne Capers M.D.   On: 06/06/2016 04:30  Dg Abd Acute W/chest  Result Date: 06/06/2016 CLINICAL DATA:  Nausea, vomiting, diarrhea, and all over abdominal pain for 5  days. Dizziness and lightheadedness. History of lung and brain cancer, last chemotherapy on Monday. Former smoker. EXAM: DG ABDOMEN ACUTE W/ 1V CHEST COMPARISON:  05/25/2016 FINDINGS: Power port type central venous catheter on the right with tip over the cavoatrial junction region. No pneumothorax. Linear scarring in the lung bases with low lung volumes. Calcified and tortuous aorta. The gaseous distention of mid abdominal small bowel with air-fluid levels. Appearance suggests partial obstruction. Scattered stool seen in the colon. No colonic distention. No free intra-abdominal air. No radiopaque stones. Degenerative changes throughout the spine and in the hips and shoulders. IMPRESSION: Linear scarring in the lung bases similar to previous study. Gas-filled distended small bowel loops with air-fluid levels suggesting small bowel obstruction. No free air. Electronically Signed   By: Lucienne Capers M.D.   On: 06/06/2016 00:59    Medications: . desmopressin  1 mcg Subcutaneous NOW  . dexamethasone  4 mg Intravenous Q6H  . pantoprazole (PROTONIX) IV  40 mg Intravenous Q24H   . dextrose      Assessment/Plan PSBO with inflammatory changes with thickening and edema distal ileum Negative C diff and GI panel today Hx of IBS, previously improved with Bentyl - currently ineffective  Urinary retention Diabetes inspisdus Mild renal insuffiencey Hypokalemia/hypomagnasemia Prior appendectomy and bilateral inguinal hernia repairs Stage IV B, non small cell lung cancer - mediastinal and brain metastasis - on steroids Stage IIA multiple myeloma  Hx of skin Melanoma Hx of PE/DVT - on anticoagulation since 02/07/14 - Xarelto, last dose 06/05/16, 10:30 AM Weight loss FEN:  NPO ID: No antibiotics DVT:  Xarelto, last dose 06/05/16 10:30 AM    Plan:  We have called GI to see about the Enteritis.  Medicine is working with the DI and he is on Desmopressin for this.  With a normal WBC, we will wait on GI  evaluation.  Film is pending for this AM. I will let him have ice chips and sips with functional NG.  LOS: 1 day    [name] 06/07/2016 [phone]

## 2016-06-08 ENCOUNTER — Inpatient Hospital Stay (HOSPITAL_COMMUNITY): Payer: Medicare Other

## 2016-06-08 DIAGNOSIS — C7931 Secondary malignant neoplasm of brain: Secondary | ICD-10-CM

## 2016-06-08 DIAGNOSIS — Z86711 Personal history of pulmonary embolism: Secondary | ICD-10-CM

## 2016-06-08 DIAGNOSIS — E232 Diabetes insipidus: Secondary | ICD-10-CM

## 2016-06-08 DIAGNOSIS — E876 Hypokalemia: Secondary | ICD-10-CM

## 2016-06-08 DIAGNOSIS — C3491 Malignant neoplasm of unspecified part of right bronchus or lung: Secondary | ICD-10-CM

## 2016-06-08 DIAGNOSIS — K56609 Unspecified intestinal obstruction, unspecified as to partial versus complete obstruction: Secondary | ICD-10-CM

## 2016-06-08 DIAGNOSIS — E870 Hyperosmolality and hypernatremia: Secondary | ICD-10-CM

## 2016-06-08 LAB — GLUCOSE, CAPILLARY
GLUCOSE-CAPILLARY: 127 mg/dL — AB (ref 65–99)
GLUCOSE-CAPILLARY: 115 mg/dL — AB (ref 65–99)
GLUCOSE-CAPILLARY: 134 mg/dL — AB (ref 65–99)
Glucose-Capillary: 132 mg/dL — ABNORMAL HIGH (ref 65–99)

## 2016-06-08 LAB — HEPARIN LEVEL (UNFRACTIONATED)
Heparin Unfractionated: 0.75 IU/mL — ABNORMAL HIGH (ref 0.30–0.70)
Heparin Unfractionated: 1.09 IU/mL — ABNORMAL HIGH (ref 0.30–0.70)

## 2016-06-08 LAB — BASIC METABOLIC PANEL
Anion gap: 7 (ref 5–15)
BUN: 19 mg/dL (ref 6–20)
CALCIUM: 8.2 mg/dL — AB (ref 8.9–10.3)
CO2: 23 mmol/L (ref 22–32)
CREATININE: 1.16 mg/dL (ref 0.61–1.24)
Chloride: 113 mmol/L — ABNORMAL HIGH (ref 101–111)
GFR calc Af Amer: >60 mL/min (ref >60)
GFR calc non Af Amer: 60 mL/min — ABNORMAL LOW (ref >60)
GLUCOSE: 151 mg/dL — AB (ref 65–99)
Potassium: 3.9 mmol/L (ref 3.5–5.1)
Sodium: 143 mmol/L (ref 135–145)

## 2016-06-08 LAB — CBC
HEMATOCRIT: 34.8 % — AB (ref 39.0–52.0)
Hemoglobin: 11.5 g/dL — ABNORMAL LOW (ref 13.0–17.0)
MCH: 31.5 pg (ref 26.0–34.0)
MCHC: 33 g/dL (ref 30.0–36.0)
MCV: 95.3 fL (ref 78.0–100.0)
PLATELETS: 120 10*3/uL — AB (ref 150–400)
RBC: 3.65 MIL/uL — ABNORMAL LOW (ref 4.22–5.81)
RDW: 18.3 % — AB (ref 11.5–15.5)
WBC: 7.4 10*3/uL (ref 4.0–10.5)

## 2016-06-08 MED ORDER — PHENOL 1.4 % MT LIQD
1.0000 | OROMUCOSAL | Status: DC | PRN
  Administered 2016-06-08: 1 via OROMUCOSAL
  Filled 2016-06-08: qty 177

## 2016-06-08 MED ORDER — HEPARIN (PORCINE) IN NACL 100-0.45 UNIT/ML-% IJ SOLN
900.0000 [IU]/h | INTRAMUSCULAR | Status: DC
  Administered 2016-06-08: 1100 [IU]/h via INTRAVENOUS
  Filled 2016-06-08: qty 250

## 2016-06-08 NOTE — Progress Notes (Addendum)
ANTICOAGULATION CONSULT NOTE - Initial Consult  Pharmacy Consult for Heparin Indication: History of PE/DVT, Xarelto on hold  Allergies  Allergen Reactions  . Tramadol Palpitations    "Made my chest feel tight"  . Amoxicillin     REACTION: rash  . Penicillins Rash    Has patient had a PCN reaction causing immediate rash, facial/tongue/throat swelling, SOB or lightheadedness with hypotension: unknown Has patient had a PCN reaction causing severe rash involving mucus membranes or skin necrosis: unknown Has patient had a PCN reaction that required hospitalization: no  Has patient had a PCN reaction occurring within the last 10 years: no  If all of the above answers are "NO", then may proceed with Cephalosporin use.     Patient Measurements: Height: '5\' 9"'$  (175.3 cm) Weight: 208 lb 8.9 oz (94.6 kg) IBW/kg (Calculated) : 70.7 Heparin Dosing Weight: 90kg  Vital Signs: Temp: 97.7 F (36.5 C) (11/29 1400) Temp Source: Oral (11/29 1400) BP: 112/85 (11/29 1400) Pulse Rate: 83 (11/29 1400)  Labs:  Recent Labs  06/05/16 2353  06/07/16 0418  06/07/16 1100 06/07/16 1255 06/07/16 1800 06/07/16 2130 06/08/16 0627 06/08/16 1800  HGB 14.7  --  12.9*  --   --   --   --   --  11.5*  --   HCT 41.1  --  38.5*  --   --   --   --   --  34.8*  --   PLT 181  --  149*  --   --   --   --   --  120*  --   APTT  --   --   --   --  22*  --   --   --   --   --   LABPROT  --   --   --   --  14.6  --   --   --   --   --   INR  --   --   --   --  1.13  --   --   --   --   --   HEPARINUNFRC  --   --   --   < > <0.10*  --   --  0.79* 0.75* 1.09*  CREATININE 1.28*  < > 1.18  --   --  1.29* 1.28*  --  1.16  --   TROPONINI <0.03  --   --   --   --   --   --   --   --   --   < > = values in this interval not displayed.  Estimated Creatinine Clearance: 62.5 mL/min (by C-G formula based on SCr of 1.16 mg/dL).   Medical History: Past Medical History:  Diagnosis Date  . Adenocarcinoma of right lung,  stage 4 ([place]) 05/27/2016  . Adenomatous polyp of colon 1996  . Arthritis    Osteoarthritis right knee, spine, wrist  . Arthritis of hand, right    Right thumb (Dr. Daylene Katayama)  . Back ache    r/o Multiple Myeloma  . Benign prostatic hypertrophy    (Dr. Karsten Ro in past)  . Degenerative disc disease   . Diverticulosis   . DVT (deep venous thrombosis) ([place]) 01/2014   R calf  . Encounter for antineoplastic chemotherapy 10/20/2015  . Erosive esophagitis   . GERD (gastroesophageal reflux disease)   . Hemorrhoids   . Hiatal hernia   . History of chronic prostatitis   . History  of melanoma    Back (Dr. Wilhemina Bonito); early melanoma R arm 03/2011  . Multiple myeloma ([place]) 2013   skull lytic lesions--treated with radiation 07/2014  . Pulmonary embolism ([place]) 01/2014  . Radiation 07/21/14-08/05/14   left occipital parietal skull 25 gray  . SCC (squamous cell carcinoma) 08/04/15   right ear;dysplastic nevus with severe atypia-chest    Medications:  Scheduled:  . desmopressin  0.52 mcg Subcutaneous Daily  . dexamethasone  4 mg Intravenous Q6H  . pantoprazole (PROTONIX) IV  40 mg Intravenous Q24H   Infusions:  . dextrose 100 mL/hr at 06/08/16 1445  . heparin 1,250 Units/hr (06/08/16 1904)    Assessment: 17 yoM admitted on 11/27 with suspected SBO.  PMH includes chronic Xarelto anticoagulation for history of PE/DVT.  Xarelto was held on admission for NPO status (NG tube to suction) and possible surgical procedures.  Pharmacy is now consulted to dose Heparin IV.  PTA rivaroxaban 3m PO daily - last dose on 11/26 at 08:30.  Today, 06/08/2016:  Heparin level elevated (1.09) however, this was drawn through implanted port which RN reports IV heparin is infusing so is likely inaccurate.  CBC: Hgb 11.5 is decreased, Plt 120 are decreased.  No bleeding reported per discussion with RN.  SCr is improving (1.28 > 1.58 > 1.18) with CrCl ~ 62 ml/min   Goal of Therapy:  Heparin level 0.3-0.7  units/ml aPTT 66-102 seconds Monitor platelets by anticoagulation protocol: Yes   Plan:   Hold heparin infusion x 30 minutes, then decrease rate to 1100 units/hr  Recheck heparin level in AM - obtain as a peripheral draw if possible  Heparin to be stopped tomorrow at 7AM for exploratory laparoscopy/laparotomy   Continue to monitor H&H and platelets   EPeggyann Juba PharmD, BCPS Pager: [phone]/29/2017 7:34 PM    Addendum: Informed by night shift RN that IV heparin is infusing peripherally, therefore, the heparin level that was drawn through the port is accurate.  Heparin has been on hold for 30 minutes, will resume at a lower rate of 900 units/hr and recheck in the morning.   EPeggyann Juba PharmD, BCPS 06/08/2016 8:26 PM

## 2016-06-08 NOTE — Progress Notes (Signed)
Phone call from pharmacy, heparin needed to be turned off for 30 minutes. Heparin drip was stopped from 2040 to 2115.  Will continue to monitor.  Roselind Rily

## 2016-06-08 NOTE — Progress Notes (Signed)
ANTICOAGULATION CONSULT NOTE - Follow Up Consult  Pharmacy Consult for Heparin Indication: History of PE/DVT, Xarelto on hold   Allergies  Allergen Reactions  . Tramadol Palpitations    "Made my chest feel tight"  . Amoxicillin     REACTION: rash  . Penicillins Rash    Has patient had a PCN reaction causing immediate rash, facial/tongue/throat swelling, SOB or lightheadedness with hypotension: unknown Has patient had a PCN reaction causing severe rash involving mucus membranes or skin necrosis: unknown Has patient had a PCN reaction that required hospitalization: no  Has patient had a PCN reaction occurring within the last 10 years: no  If all of the above answers are "NO", then may proceed with Cephalosporin use.     Patient Measurements: Height: '5\' 9"'$  (175.3 cm) Weight: 208 lb 8.9 oz (94.6 kg) IBW/kg (Calculated) : 70.7 Heparin Dosing Weight:   Vital Signs: Temp: 97.6 F (36.4 C) (11/29 0505) Temp Source: Axillary (11/29 0505) BP: 125/80 (11/29 0505) Pulse Rate: 86 (11/29 0505)  Labs:  Recent Labs  06/05/16 2353  06/07/16 0418 06/07/16 1100 06/07/16 1255 06/07/16 1800 06/07/16 2130 06/08/16 0627  HGB 14.7  --  12.9*  --   --   --   --  11.5*  HCT 41.1  --  38.5*  --   --   --   --  34.8*  PLT 181  --  149*  --   --   --   --  120*  APTT  --   --   --  22*  --   --   --   --   LABPROT  --   --   --  14.6  --   --   --   --   INR  --   --   --  1.13  --   --   --   --   HEPARINUNFRC  --   --   --  <0.10*  --   --  0.79* 0.75*  CREATININE 1.28*  < > 1.18  --  1.29* 1.28*  --   --   TROPONINI <0.03  --   --   --   --   --   --   --   < > = values in this interval not displayed.  Estimated Creatinine Clearance: 56.6 mL/min (by C-G formula based on SCr of 1.28 mg/dL (H)).   Medications:  Infusions:  . dextrose 100 mL/hr at 06/08/16 0438  . heparin 1,250 Units/hr (06/08/16 0705)    Assessment: Patient with high heparin level.  No heparin issues per  RN.  Goal of Therapy:  Heparin level 0.3-0.7 units/ml Monitor platelets by anticoagulation protocol: Yes   Plan:  Decrease heparin to 1250 units/hr Recheck level at [place] 06/08/2016,7:04 AM

## 2016-06-08 NOTE — Progress Notes (Signed)
     [facility] Progress Note  Chief Complaint:   Diarrhea, bowel obstruction Subjective: Passed very scant watery stool. No flatus. No significant abdominal pain.   Objective:  Vital signs in last 24 hours: Temp:  [97.6 F (36.4 C)-98.6 F (37 C)] 97.6 F (36.4 C) (11/29 0505) Pulse Rate:  [86-101] 86 (11/29 0505) Resp:  [18] 18 (11/29 0505) BP: (118-129)/(80-99) 125/80 (11/29 0505) SpO2:  [93 %-96 %] 95 % (11/29 0505) Last BM Date: 06/06/16 General:   Alert, well-developed, white male in NAD EENT:  Normal hearing, non icteric sclera, conjunctive pink.  Heart:  Regular rate and rhythm; no murmurs,.trace BLE edema Pulm: Normal respiratory effort, lungs CTA bilaterally without wheezes or crackles. Abdomen:  Soft, mild-moderately distended. NGT clamped (going for xray). A few obstructive bowel sounds .    Neurologic:  Alert and  oriented x4;  grossly normal neurologically. Psych:  Alert and cooperative. Normal mood and affect.  Intake/Output from previous day: 11/28 0701 - 11/29 0700 In: 3032.3 [P.O.:970; I.V.:2062.3] Out: 3090 [Urine:1215; Emesis/NG ZOXWRU:0454] Intake/Output this shift: No intake/output data recorded.  Lab Results:  Recent Labs  06/05/16 2353 06/07/16 0418 06/08/16 0627  WBC 9.6 5.9 7.4  HGB 14.7 12.9* 11.5*  HCT 41.1 38.5* 34.8*  PLT 181 149* 120*   BMET  Recent Labs  06/07/16 1255 06/07/16 1800 06/08/16 0627  NA 151* 149* 143  K 4.3 4.4 3.9  CL 121* 120* 113*  CO2 '24 24 23  '$ GLUCOSE 133* 158* 151*  BUN '16 18 19  '$ CREATININE 1.29* 1.28* 1.16  CALCIUM 9.1 8.8* 8.2*   LFT  Recent Labs  06/05/16 2353  PROT 7.0  ALBUMIN 4.1  AST 22  ALT 34  ALKPHOS 58  BILITOT 0.7   PT/INR  Recent Labs  06/07/16 1100  LABPROT 14.6  INR 1.13    Assessment / Plan:  1. SBO.  Dr. Henrene Pastor reviewed films with Radiologist last evening, please refer to consult note from yesterday. Basically, SBO most likely mechanical, possibly from  adhesions. NGT output nearly 2,044m yesterday (but may have gotten ice chips) and not passing any flatus. Surgery is planning for  Exp lap with possible SB resection tomorrow if not improving. Patient headed down now for repeat abdominal films.  2. Stage IV non small cell lung cancer with brain mets. On steroids   3. Hx of PE,  Xarelto on hold. On heparin gtt.   4. AKI, resolved   LOS: 2 days   PTye Savoy 06/08/2016, 9:30 AM Pager number [phone] GI ATTENDING  Interval history data reviewed. Patient personally seen and examined. Agree with interval progress note. Patient is clinically unchanged. No flatus or bowel movement. Abdominal discomfort no worse. X-rays modestly improved. Continue with IV fluids, NG suction, and repeat films in a.m. Surgery following closely. Discussed with patient.  JDocia Chuck PGeri Semin[place], M.D. LWoodridge Behavioral CenterDivision of Gastroenterology

## 2016-06-08 NOTE — Progress Notes (Signed)
PROGRESS NOTE    [name]  RRN:[id number] DOB: [birth year] DOA: 06/05/2016 PCP: [name], MD   Brief Narrative: 75 y.o. male with a past medical history significant for metastatic lung cancer, PE on Xarelto who presents with intermittent abdominal pain and vomiting.CT of the abdomen showed small bowel obstruction with transition point near an area of "inflammatory process".Placement of NG tube failed, and TRH were asked to evaluate for admission for SBO.  (The patient was first diagnosed with cancer in 2013, with multiple myeloma. He underwent 2 rounds of Velcade, Revlimid, carfilzomib then had a BMT at [facility].  Then in 2017 he was diagnosed with brain metastases, and found to have a lung primary.  He has undergone skull radiation and is about to start [place] this Tuesday.)  Assessment & Plan:   # Small bowel obstruction: -Currently has NG tube for decompression, continue nothing by mouth, Pepcid IV. General surgery consult appreciated. -GI consult was called by surgery team. -Repeat abdominal x-ray of abdomen showed increased distention of the small bowel, slight worsening in small bowel obstruction compared to prior study. -Continue IV dilaudid and IV zofran -continue supportive care.  -Follow-up with Gen. surgery and GI for further plan and recommendation. -if next x-ray (to be done 11/30) remains abnormal plan is to proceed with surgery.  # Hypokalemia/hypomagnesemia: Improved.  -most likely associated with NGT -will monitor and replete as needed   #hypernatremia: Patient reported that he was diagnosed with diabetes insipidus and takes DDAVP at night.  -with adjustment to IVF's and initiation of DDAVP electrolytes back to normal range -will monitor  #Acute kidney injury likely due to dehydration and urinary retention: -Patient had urinary retention yesterday with more than a liter of urine in bladder scan. Status post Foley catheter insertion with increased urinary output.    -Continue IV fluid and follow renal function trend -Serum creatinine level 1.16 today.  #  History of pulmonary embolism: We will start IV heparin until final plan by surgery and GI. Holding xarelto for now  #  Brain metastases [facility])   Adenocarcinoma of right lung, stage 4 ([place]): on decadron IV and   PPI. Discussed with pharmacist.     DVT prophylaxis:SCD Code Status:full Family Communication:no Disposition Plan:to be determine. Patient has failed conservative management and will most likely need lysis of adhesion/surgery for his SBO.   Consultants:   Surgery team  GI  Procedures:none Antimicrobials:none  Subjective:  Complaining of abd pain, he is hungry and biggest frustration is dry mouth and sore throat from NGT. He wil like to proceed with surgery.  Objective: Vitals:   06/08/16 0505 06/08/16 0900 06/08/16 1400 06/08/16 2034  BP: 125/80 127/68 112/85 119/67  Pulse: 86 79 83 83  Resp: '18 16 18 18  '$ Temp: 97.6 F (36.4 C) 98.3 F (36.8 C) 97.7 F (36.5 C) 97.7 F (36.5 C)  TempSrc: Axillary Oral Oral Oral  SpO2: 95% 97% 94% 99%  Weight:      Height:        Intake/Output Summary (Last 24 hours) at 06/08/16 2218 Last data filed at 06/08/16 1801  Gross per 24 hour  Intake          2419.16 ml  Output             1295 ml  Net          1124.16 ml   Filed Weights   06/06/16 0900  Weight: 94.6 kg (208 lb 8.9 oz)    Examination:  General exam: Pleasant male sitting comfortably on bed, has NG tube with brown-greenish secretion. No fever and complains of dry mouth and sore throat. Respiratory system: Clear bilateral, no wheezing or crackle Cardiovascular system: Regular rate rhythm, S1 and S2 normal. No pedal edema Gastrointestinal system: Abdomen soft, diffuse tenderness and mildly distended. Bowel sound sluggish to absent. Central nervous system: Alert awake, oriented. No focal neurological deficit Extremities: Symmetric 5 x 5 power. Skin: No rashes, lesions  or ulcers Psychiatry: Judgement and insight appear normal. Mood & affect appropriate.     Data Reviewed: I have personally reviewed following labs and imaging studies  CBC:  Recent Labs Lab 06/05/16 2353 06/07/16 0418 06/08/16 0627  WBC 9.6 5.9 7.4  NEUTROABS 8.5*  --   --   HGB 14.7 12.9* 11.5*  HCT 41.1 38.5* 34.8*  MCV 90.9 96.3 95.3  PLT 181 149* 007*   Basic Metabolic Panel:  Recent Labs Lab 06/06/16 0833 06/07/16 0418 06/07/16 1255 06/07/16 1800 06/08/16 0627  NA 136 154* 151* 149* 143  K 3.1* 4.7 4.3 4.4 3.9  CL 102 124* 121* 120* 113*  CO2 '24 25 24 24 23  '$ GLUCOSE 114* 138* 133* 158* 151*  BUN '17 14 16 18 19  '$ CREATININE 1.58* 1.18 1.29* 1.28* 1.16  CALCIUM 9.3 9.1 9.1 8.8* 8.2*  MG 1.6* 2.4  --   --   --    GFR: Estimated Creatinine Clearance: 62.5 mL/min (by C-G formula based on SCr of 1.16 mg/dL). Liver Function Tests:  Recent Labs Lab 06/05/16 2353  AST 22  ALT 34  ALKPHOS 58  BILITOT 0.7  PROT 7.0  ALBUMIN 4.1    Recent Labs Lab 06/05/16 2353  LIPASE 42   Coagulation Profile:  Recent Labs Lab 06/07/16 1100  INR 1.13   Cardiac Enzymes:  Recent Labs Lab 06/05/16 2353  TROPONINI <0.03   CBG:  Recent Labs Lab 06/07/16 2204 06/08/16 0810 06/08/16 1201 06/08/16 1643 06/08/16 2033  GLUCAP 138* 127* 115* 132* 134*    Recent Results (from the past 240 hour(s))  C difficile quick scan w PCR reflex     Status: None   Collection Time: 06/06/16  4:07 AM  Result Value Ref Range Status   C Diff antigen NEGATIVE NEGATIVE Final   C Diff toxin NEGATIVE NEGATIVE Final   C Diff interpretation No C. difficile detected.  Final  Gastrointestinal Panel by PCR , Stool     Status: None   Collection Time: 06/06/16  4:07 AM  Result Value Ref Range Status   Campylobacter species NOT DETECTED NOT DETECTED Final   Plesimonas shigelloides NOT DETECTED NOT DETECTED Final   Salmonella species NOT DETECTED NOT DETECTED Final   Yersinia  enterocolitica NOT DETECTED NOT DETECTED Final   Vibrio species NOT DETECTED NOT DETECTED Final   Vibrio cholerae NOT DETECTED NOT DETECTED Final   Enteroaggregative E coli (EAEC) NOT DETECTED NOT DETECTED Final   Enteropathogenic E coli (EPEC) NOT DETECTED NOT DETECTED Final   Enterotoxigenic E coli (ETEC) NOT DETECTED NOT DETECTED Final   Shiga like toxin producing E coli (STEC) NOT DETECTED NOT DETECTED Final   Shigella/Enteroinvasive E coli (EIEC) NOT DETECTED NOT DETECTED Final   Cryptosporidium NOT DETECTED NOT DETECTED Final   Cyclospora cayetanensis NOT DETECTED NOT DETECTED Final   Entamoeba histolytica NOT DETECTED NOT DETECTED Final   Giardia lamblia NOT DETECTED NOT DETECTED Final   Adenovirus F40/41 NOT DETECTED NOT DETECTED Final   Astrovirus NOT DETECTED NOT  DETECTED Final   Norovirus GI/GII NOT DETECTED NOT DETECTED Final   Rotavirus A NOT DETECTED NOT DETECTED Final   Sapovirus (I, II, IV, and V) NOT DETECTED NOT DETECTED Final     Radiology Studies: Dg Abd 2 Views  Result Date: 06/08/2016 CLINICAL DATA:  Followup small bowel obstruction. EXAM: ABDOMEN - 2 VIEW COMPARISON:  06/07/2016 FINDINGS: Nasogastric tube remains in place, tip in the stomach in side hole above the diaphragm. There is less dilated small intestine but not complete resolution. There is gas within the colon. No sign of free air. IMPRESSION: Persistent but improved small bowel obstruction pattern. Electronically Signed   By: Nelson Chimes M.D.   On: 06/08/2016 10:28   Dg Abd 2 Views  Result Date: 06/07/2016 CLINICAL DATA:  Abdominal distention. Follow-up small bowel obstruction EXAM: ABDOMEN - 2 VIEW COMPARISON:  06/06/2016 FINDINGS: Increasing small bowel dilatation since prior study. Findings concerning for worsening small bowel obstruction. Gas also noted within the colon. No free air organomegaly. NG tube tip is in the proximal stomach with the side port in the distal esophagus. IMPRESSION: Increasing  gaseous distention of small bowel loops with scattered air-fluid levels. Findings compatible with slight worsening in small bowel obstruction since prior study. Electronically Signed   By: Rolm Baptise M.D.   On: 06/07/2016 11:26    Scheduled Meds: . desmopressin  0.52 mcg Subcutaneous Daily  . dexamethasone  4 mg Intravenous Q6H  . pantoprazole (PROTONIX) IV  40 mg Intravenous Q24H   Continuous Infusions: . dextrose 100 mL/hr at 06/08/16 1445  . heparin 900 Units/hr (06/08/16 2114)     LOS: 2 days   Barton Dubois, MD [facility] Pager [phone]  If 7PM-7AM, please contact night-coverage [email or web address] Password [email or web address] 06/08/2016, 10:18 PM

## 2016-06-08 NOTE — Progress Notes (Signed)
Subjective: No real change, reports some sleep with IV ativan. About 1/2 of his NG drainage is from PO intake.  He was pretty confused yesterday afternoon, per nursing but seems better this clearer this AM.  Objective: Vital signs in last 24 hours: Temp:  [97.6 F (36.4 C)-98.6 F (37 C)] 97.6 F (36.4 C) (11/29 0505) Pulse Rate:  [86-101] 86 (11/29 0505) Resp:  [18] 18 (11/29 0505) BP: (118-129)/(80-99) 125/80 (11/29 0505) SpO2:  [93 %-96 %] 95 % (11/29 0505) Last BM Date: 06/06/16 970 PO 2062 IV 1215 urine 1875 nNG Afebrile, VSS NA down to 143 this AM Labs show the glucose is up some, but other labs are better Film for this AM is pending   Intake/Output from previous day: 11/28 0701 - 11/29 0700 In: 3032.3 [P.O.:970; I.V.:2062.3] Out: 3090 [Urine:1215; Emesis/NG XAJOIN:8676] Intake/Output this shift: No intake/output data recorded.  General appearance: alert, cooperative and no distress Resp: clear to auscultation bilaterally GI: soft, somewhat distended, in sitting position, few hyperactive BS he is not tender  Lab Results:   Recent Labs  06/07/16 0418 06/08/16 0627  WBC 5.9 7.4  HGB 12.9* 11.5*  HCT 38.5* 34.8*  PLT 149* 120*    BMET  Recent Labs  06/07/16 1800 06/08/16 0627  NA 149* 143  K 4.4 3.9  CL 120* 113*  CO2 24 23  GLUCOSE 158* 151*  BUN 18 19  CREATININE 1.28* 1.16  CALCIUM 8.8* 8.2*   PT/INR  Recent Labs  06/07/16 1100  LABPROT 14.6  INR 1.13     Recent Labs Lab 06/05/16 2353  AST 22  ALT 34  ALKPHOS 58  BILITOT 0.7  PROT 7.0  ALBUMIN 4.1     Lipase     Component Value Date/Time   LIPASE 42 06/05/2016 2353     Studies/Results: Dg Abd 2 Views  Result Date: 06/07/2016 CLINICAL DATA:  Abdominal distention. Follow-up small bowel obstruction EXAM: ABDOMEN - 2 VIEW COMPARISON:  06/06/2016 FINDINGS: Increasing small bowel dilatation since prior study. Findings concerning for worsening small bowel obstruction. Gas  also noted within the colon. No free air organomegaly. NG tube tip is in the proximal stomach with the side port in the distal esophagus. IMPRESSION: Increasing gaseous distention of small bowel loops with scattered air-fluid levels. Findings compatible with slight worsening in small bowel obstruction since prior study. Electronically Signed   By: Rolm Baptise M.D.   On: 06/07/2016 11:26    Medications: . desmopressin  0.52 mcg Subcutaneous Daily  . dexamethasone  4 mg Intravenous Q6H  . pantoprazole (PROTONIX) IV  40 mg Intravenous Q24H   . dextrose 100 mL/hr at 06/08/16 0438  . heparin 1,250 Units/hr (06/08/16 0705)    Assessment/Plan PSBO with inflammatory changes with thickening and edema distal ileum Negative C diff and GI panel today Hx of IBS, previously improved with Bentyl - currently ineffective  Urinary retention Diabetes inspisdus Mild renal insuffiencey - Improving Hypernatremia - improving Prior appendectomy and bilateral inguinal hernia repairs Stage IV B, non small cell lung cancer - mediastinal and brain metastasis - on steroids Stage IIA multiple myeloma  Hx of skin Melanoma Hx of PE/DVT - on anticoagulation since 02/07/14 - Xarelto, last dose 06/05/16, 10:30 AM - Heparin drip 06/08/16 Weight loss FEN: NPO ID: No antibiotics DVT: Xarelto, last dose 06/05/16 10:30 AM    Plan:  If no better by tomorrow, we plan exploratory laparoscopy/laparotomy with possible SB resection.  I will ask pharmacy to hold heparin  at 7 AM till we make a decision on surgery.  Recheck labs in AM with type and screen.  LOS: 2 days    [name] 06/08/2016 [phone]

## 2016-06-09 ENCOUNTER — Inpatient Hospital Stay (HOSPITAL_COMMUNITY): Payer: Medicare Other | Admitting: Anesthesiology

## 2016-06-09 ENCOUNTER — Inpatient Hospital Stay (HOSPITAL_COMMUNITY): Payer: Medicare Other

## 2016-06-09 ENCOUNTER — Encounter (HOSPITAL_COMMUNITY)
Admission: EM | Disposition: A | Discharge status: Home / Self Care | Payer: Self-pay | Source: Home / Self Care | Attending: Internal Medicine

## 2016-06-09 LAB — CBC
HCT: 31.2 % — ABNORMAL LOW (ref 39.0–52.0)
Hemoglobin: 10.5 g/dL — ABNORMAL LOW (ref 13.0–17.0)
MCH: 32.3 pg (ref 26.0–34.0)
MCHC: 33.7 g/dL (ref 30.0–36.0)
MCV: 96 fL (ref 78.0–100.0)
PLATELETS: 94 10*3/uL — AB (ref 150–400)
RBC: 3.25 MIL/uL — AB (ref 4.22–5.81)
RDW: 17.2 % — AB (ref 11.5–15.5)
WBC: 6.4 10*3/uL (ref 4.0–10.5)

## 2016-06-09 LAB — HEPARIN LEVEL (UNFRACTIONATED)
HEPARIN UNFRACTIONATED: 0.36 [IU]/mL (ref 0.30–0.70)
HEPARIN UNFRACTIONATED: 0.19 [IU]/mL — AB (ref 0.30–0.70)

## 2016-06-09 LAB — SURGICAL PCR SCREEN
MRSA, PCR: NEGATIVE
Staphylococcus aureus: NEGATIVE

## 2016-06-09 LAB — BASIC METABOLIC PANEL
ANION GAP: 7 (ref 5–15)
BUN: 15 mg/dL (ref 6–20)
CALCIUM: 7.5 mg/dL — AB (ref 8.9–10.3)
CO2: 23 mmol/L (ref 22–32)
Chloride: 106 mmol/L (ref 101–111)
Creatinine, Ser: 0.92 mg/dL (ref 0.61–1.24)
GFR calc Af Amer: >60 mL/min (ref >60)
GLUCOSE: 141 mg/dL — AB (ref 65–99)
POTASSIUM: 3.6 mmol/L (ref 3.5–5.1)
SODIUM: 136 mmol/L (ref 135–145)

## 2016-06-09 LAB — GLUCOSE, CAPILLARY
GLUCOSE-CAPILLARY: 142 mg/dL — AB (ref 65–99)
GLUCOSE-CAPILLARY: 141 mg/dL — AB (ref 65–99)
GLUCOSE-CAPILLARY: 140 mg/dL — AB (ref 65–99)
Glucose-Capillary: 129 mg/dL — ABNORMAL HIGH (ref 65–99)

## 2016-06-09 LAB — TYPE AND SCREEN
ABO/RH(D): O POS
Antibody Screen: NEGATIVE

## 2016-06-09 LAB — ABO/RH: ABO/RH(D): O POS

## 2016-06-09 SURGERY — LAPAROTOMY, EXPLORATORY
Anesthesia: General

## 2016-06-09 MED ORDER — CHLORHEXIDINE GLUCONATE CLOTH 2 % EX PADS
6.0000 | MEDICATED_PAD | Freq: Once | CUTANEOUS | Status: AC
  Administered 2016-06-09: 6 via TOPICAL

## 2016-06-09 MED ORDER — CLINDAMYCIN PHOSPHATE 900 MG/50ML IV SOLN: 900.0000 mg | INTRAVENOUS | Status: DC

## 2016-06-09 MED ORDER — HYDROMORPHONE HCL 4 MG/ML IJ SOLN
1.0000 mg | INTRAMUSCULAR | Status: DC | PRN
  Administered 2016-06-09 – 2016-06-10 (×4): 1 mg via INTRAVENOUS
  Filled 2016-06-09 (×4): qty 1

## 2016-06-09 MED ORDER — GENTAMICIN SULFATE 40 MG/ML IJ SOLN
5.0000 mg/kg | INTRAVENOUS | Status: DC
  Filled 2016-06-09: qty 10

## 2016-06-09 MED ORDER — CHLORHEXIDINE GLUCONATE CLOTH 2 % EX PADS
6.0000 | MEDICATED_PAD | Freq: Once | CUTANEOUS | Status: DC
Start: 2016-06-09 — End: 2016-06-09

## 2016-06-09 MED ORDER — HEPARIN (PORCINE) IN NACL 100-0.45 UNIT/ML-% IJ SOLN
900.0000 [IU]/h | INTRAMUSCULAR | Status: DC
  Filled 2016-06-09: qty 250

## 2016-06-09 NOTE — Progress Notes (Addendum)
ANTICOAGULATION CONSULT NOTE - Follow up [facility] for Heparin Indication: History of PE/DVT, Xarelto on hold  Patient Measurements: Height: '5\' 9"'$  (175.3 cm) Weight: 208 lb 8.9 oz (94.6 kg) IBW/kg (Calculated) : 70.7 Heparin Dosing Weight: 90kg  Vital Signs: Temp: 98 F (36.7 C) (11/30 0602) Temp Source: Oral (11/30 0602) BP: 128/70 (11/30 0602) Pulse Rate: 70 (11/30 0602)  Labs:  Recent Labs  06/07/16 0418 06/07/16 1100  06/07/16 1800  06/08/16 0627 06/08/16 1800 06/09/16 0610  HGB 12.9*  --   --   --   --  11.5*  --  10.5*  HCT 38.5*  --   --   --   --  34.8*  --  31.2*  PLT 149*  --   --   --   --  120*  --  94*  APTT  --  22*  --   --   --   --   --   --   LABPROT  --  14.6  --   --   --   --   --   --   INR  --  1.13  --   --   --   --   --   --   HEPARINUNFRC  --  <0.10*  --   --   < > 0.75* 1.09* 0.36  CREATININE 1.18  --   < > 1.28*  --  1.16  --  0.92  < > = values in this interval not displayed.  Estimated Creatinine Clearance: 78.8 mL/min (by C-G formula based on SCr of 0.92 mg/dL).  Medications:  Scheduled:  . desmopressin  0.52 mcg Subcutaneous Daily  . dexamethasone  4 mg Intravenous Q6H  . pantoprazole (PROTONIX) IV  40 mg Intravenous Q24H   Infusions:  . dextrose 100 mL/hr at 06/09/16 0200    Assessment: 38 yoM admitted on 11/27 with suspected SBO.  PMH includes chronic Xarelto anticoagulation for history of PE/DVT.  Xarelto was held on admission for NPO status (NG tube to suction) and possible surgical procedures.  Pharmacy is now consulted to dose Heparin IV.  PTA rivaroxaban '20mg'$  PO daily - last dose on 11/26 at 08:30.  Today, 06/09/2016:  Heparin level this morning was therapeutic at 0.36 on reduced rate infusion of 900 units/hr  Previous levels elevated:  Most recently HL 1.09 on 11/29 PM despite decreasing infusion rates. RN reports IV heparin is infusing peripherally, therefore, the heparin level that was drawn through  the port is accurate.  CBC: Hgb continues to decrease (14.7 > 12.9 > 11.5 > 10.5), Plt decreased to 94  No bleeding reported per discussion with RN, but she reports that he has some old bruising, nothing acute.  SCr is improving (1.28 > 1.58 > 1.18 > 0.92) with CrCl ~ 62 ml/min   Goal of Therapy:  Heparin level 0.3-0.7 units/ml aPTT 66-102 seconds Monitor platelets by anticoagulation protocol: Yes   Plan:   Heparin was stopped today at 7AM for planned exploratory laparoscopy/laparotomy   Pharmacy to f/u plans for surgery and when to resume anticoagulation.  Follow up CBC, monitor for s/s bleeding or complications.  Gretta Arab PharmD, BCPS Pager [phone] 06/09/2016 7:07 AM     Addendum:   Dr. Excell Seltzer has examined patient, and does not think he needs surgery at this time. Pharmacy is consulted to resume Heparin IV. Plan:  Resume heparin IV infusion at 900 units/hr  Heparin level 8 hours after starting  Daily heparin level and CBC  Continue to closely monitor H&H and platelets, s/s bleeding   Gretta Arab PharmD, BCPS Pager [phone] 06/09/2016 2:25 PM

## 2016-06-09 NOTE — Progress Notes (Signed)
PROGRESS NOTE    [name]  TWS:[id number] DOB: Oct 01, 1940 DOA: 06/05/2016 PCP: [name], MD   Brief Narrative: 75 y.o. male with a past medical history significant for metastatic lung cancer, PE on Xarelto who presents with intermittent abdominal pain and vomiting.CT of the abdomen showed small bowel obstruction with transition point near an area of "inflammatory process".Placement of NG tube failed, and TRH were asked to evaluate for admission for SBO.  (The patient was first diagnosed with cancer in 2013, with multiple myeloma. He underwent 2 rounds of Velcade, Revlimid, carfilzomib then had a BMT at [facility].  Then in 2017 he was diagnosed with brain metastases, and found to have a lung primary.  He has undergone skull radiation and is about to start [place] this Tuesday.)  Assessment & Plan: # Small bowel obstruction: -Currently has NG tube for decompression, continue nothing by mouth, Pepcid IV. General surgery consult appreciated. -GI consult was called by surgery team. -Repeat abdominal x-ray of abdomen demonstrated resolution of obstruction; holding surgery now. -Continue IV dilaudid and IV zofran -continue supportive care.  -Follow-up with Gen. surgery and GI for further plan and recommendation. -improvement in x-ray and apparently resolution of obstruction. NGT in clamping trial. Potential CT enterography and if needed maybe if next colonoscopy by GI.  # Hypokalemia/hypomagnesemia: Improved.  -most likely associated with NGT -will monitor and replete as needed   #hypernatremia: Patient reported that he was diagnosed with diabetes insipidus and takes DDAVP at night.  -with adjustment to IVF's and initiation of DDAVP electrolytes back to normal range -will monitor  #Acute kidney injury likely due to dehydration and urinary retention: -Patient had urinary retention yesterday with more than a liter of urine in bladder scan. Status post Foley catheter insertion with increased  urinary output.  -Continue IV fluid and follow renal function trend -Serum creatinine level 0.92 today.  #  History of pulmonary embolism: We will start IV heparin until final plan by surgery and GI. Holding xarelto for now  #  Brain metastases [facility])   Adenocarcinoma of right lung, stage 4 ([place]): on decadron IV and   PPI. Discussed with pharmacist.     DVT prophylaxis:SCD Code Status:full Family Communication:no Disposition Plan:to be determine. Patient has failed conservative management and will most likely need lysis of adhesion/surgery for his SBO.   Consultants:   Surgery team  GI  Procedures:none Antimicrobials:none  Subjective:  Complaining of some abd pain, no fever no CP, no SOB. Reports some HA and is complaining of sore throat.  Objective: Vitals:   06/08/16 1400 06/08/16 2034 06/09/16 0602 06/09/16 1420  BP: 112/85 119/67 128/70 108/71  Pulse: 83 83 70 68  Resp: _0 Temp: 97.7 F (36.5 C) 97.7 F (36.5 C) 98 F (36.7 C) 98.4 F (36.9 C)  TempSrc: Oral Oral Oral Oral  SpO2: 94% 99% 100% 96%  Weight:      Height:        Intake/Output Summary (Last 24 hours) at 06/09/16 1926 Last data filed at 06/09/16 1420  Gross per 24 hour  Intake          1874.27 ml  Output              800 ml  Net          1074.27 ml   Filed Weights   06/06/16 0900  Weight: 94.6 kg (208 lb 8.9 oz)    Examination:  General exam: Pleasant male sitting comfortably on  bed, has NG tube with clamping trial; No fever and still complaining of dry mouth and sore throat. Respiratory system: Clear bilateral, no wheezing or crackles Cardiovascular system: Regular rate rhythm, S1 and S2 normal. No pedal edema Gastrointestinal system: Abdomen soft, less distended. Bowel sound sluggish, but increase. Central nervous system: Alert awake, oriented. No focal neurological deficit Extremities: Symmetric 5 x 5 power. Skin: No rashes, lesions or ulcers Psychiatry: Judgement and insight  appear normal. Mood & affect appropriate.    Data Reviewed: I have personally reviewed following labs and imaging studies  CBC:  Recent Labs Lab 06/05/16 2353 06/07/16 0418 06/08/16 0627 06/09/16 0610  WBC 9.6 5.9 7.4 6.4  NEUTROABS 8.5*  --   --   --   HGB 14.7 12.9* 11.5* 10.5*  HCT 41.1 38.5* 34.8* 31.2*  MCV 90.9 96.3 95.3 96.0  PLT 181 149* 120* 94*   Basic Metabolic Panel:  Recent Labs Lab 06/06/16 0833 06/07/16 0418 06/07/16 1255 06/07/16 1800 06/08/16 0627 06/09/16 0610  NA 136 154* 151* 149* 143 136  K 3.1* 4.7 4.3 4.4 3.9 3.6  CL 102 124* 121* 120* 113* 106  CO2 _0 GLUCOSE 114* 138* 133* 158* 151* 141*  BUN _1 CREATININE 1.58* 1.18 1.29* 1.28* 1.16 0.92  CALCIUM 9.3 9.1 9.1 8.8* 8.2* 7.5*  MG 1.6* 2.4  --   --   --   --    GFR: Estimated Creatinine Clearance: 78.8 mL/min (by C-G formula based on SCr of 0.92 mg/dL).   Liver Function Tests:  Recent Labs Lab 06/05/16 2353  AST 22  ALT 34  ALKPHOS 58  BILITOT 0.7  PROT 7.0  ALBUMIN 4.1    Recent Labs Lab 06/05/16 2353  LIPASE 42   Coagulation Profile:  Recent Labs Lab 06/07/16 1100  INR 1.13   Cardiac Enzymes:  Recent Labs Lab 06/05/16 2353  TROPONINI <0.03   CBG:  Recent Labs Lab 06/08/16 1643 06/08/16 2033 06/09/16 0805 06/09/16 1156 06/09/16 1644  GLUCAP 132* 134* 142* 141* 140*    Recent Results (from the past 240 hour(s))  C difficile quick scan w PCR reflex     Status: None   Collection Time: 06/06/16  4:07 AM  Result Value Ref Range Status   C Diff antigen NEGATIVE NEGATIVE Final   C Diff toxin NEGATIVE NEGATIVE Final   C Diff interpretation No C. difficile detected.  Final  Gastrointestinal Panel by PCR , Stool     Status: None   Collection Time: 06/06/16  4:07 AM  Result Value Ref Range Status   Campylobacter species NOT DETECTED NOT DETECTED Final   Plesimonas shigelloides NOT DETECTED NOT DETECTED Final   Salmonella  species NOT DETECTED NOT DETECTED Final   Yersinia enterocolitica NOT DETECTED NOT DETECTED Final   Vibrio species NOT DETECTED NOT DETECTED Final   Vibrio cholerae NOT DETECTED NOT DETECTED Final   Enteroaggregative E coli (EAEC) NOT DETECTED NOT DETECTED Final   Enteropathogenic E coli (EPEC) NOT DETECTED NOT DETECTED Final   Enterotoxigenic E coli (ETEC) NOT DETECTED NOT DETECTED Final   Shiga like toxin producing E coli (STEC) NOT DETECTED NOT DETECTED Final   Shigella/Enteroinvasive E coli (EIEC) NOT DETECTED NOT DETECTED Final   Cryptosporidium NOT DETECTED NOT DETECTED Final   Cyclospora cayetanensis NOT DETECTED NOT DETECTED Final   Entamoeba histolytica NOT DETECTED NOT DETECTED Final   Giardia lamblia NOT DETECTED NOT DETECTED Final  Adenovirus F40/41 NOT DETECTED NOT DETECTED Final   Astrovirus NOT DETECTED NOT DETECTED Final   Norovirus GI/GII NOT DETECTED NOT DETECTED Final   Rotavirus A NOT DETECTED NOT DETECTED Final   Sapovirus (I, II, IV, and V) NOT DETECTED NOT DETECTED Final  Surgical PCR screen     Status: None   Collection Time: 06/09/16  7:54 AM  Result Value Ref Range Status   MRSA, PCR NEGATIVE NEGATIVE Final   Staphylococcus aureus NEGATIVE NEGATIVE Final    Comment:        The Xpert SA Assay (FDA approved for NASAL specimens in patients over 41 years of age), is one component of a comprehensive surveillance program.  Test performance has been validated by [facility] for patients greater than or equal to 39 year old. It is not intended to diagnose infection nor to guide or monitor treatment.      Radiology Studies: Dg Abd 1 View  Result Date: 06/09/2016 CLINICAL DATA:  Followup Small-bowel obstruction EXAM: ABDOMEN - 1 VIEW COMPARISON:  06/08/2016 FINDINGS: Scattered large and small bowel gas is seen. The small bowel dilatation noted on the prior exam has resolved. No free air is seen. Stable degenerative change of the lumbar spine is noted.  Nasogastric catheter is noted within the stomach. IMPRESSION: Resolution of previously seen small bowel obstruction. Electronically Signed   By: Inez Catalina M.D.   On: 06/09/2016 07:29   Dg Abd 2 Views  Result Date: 06/08/2016 CLINICAL DATA:  Followup small bowel obstruction. EXAM: ABDOMEN - 2 VIEW COMPARISON:  06/07/2016 FINDINGS: Nasogastric tube remains in place, tip in the stomach in side hole above the diaphragm. There is less dilated small intestine but not complete resolution. There is gas within the colon. No sign of free air. IMPRESSION: Persistent but improved small bowel obstruction pattern. Electronically Signed   By: Nelson Chimes M.D.   On: 06/08/2016 10:28    Scheduled Meds: . clindamycin (CLEOCIN) IV  900 mg Intravenous On Call to OR   And  . gentamicin  5 mg/kg (Adjusted) Intravenous On Call to OR  . desmopressin  0.52 mcg Subcutaneous Daily  . dexamethasone  4 mg Intravenous Q6H  . pantoprazole (PROTONIX) IV  40 mg Intravenous Q24H   Continuous Infusions: . dextrose 100 mL/hr at 06/09/16 1044  . heparin 900 Units/hr (06/09/16 1510)     LOS: 3 days   Barton Dubois, MD [facility] Pager [phone]  If 7PM-7AM, please contact night-coverage [email or web address] Password [email or web address] 06/09/2016, 7:26 PM

## 2016-06-09 NOTE — Progress Notes (Signed)
Day of Surgery  Subjective: He says he feels no better, no change in abdominal distension unchanged.    Objective: Vital signs in last 24 hours: Temp:  [97.7 F (36.5 C)-98 F (36.7 C)] 98 F (36.7 C) (11/30 0602) Pulse Rate:  [70-83] 70 (11/30 0602) Resp:  [18-20] 20 (11/30 0602) BP: (112-128)/(67-85) 128/70 (11/30 0602) SpO2:  [94 %-100 %] 100 % (11/30 0602) Last BM Date: 06/06/16 PO 500 yesterday 3000 IV Urine 740 604 from NG recorded - 1 shift Afebrile, VSS No labs this AM Film this OI:NOMVEHMCNO of previously seen small bowel obstruction.  Intake/Output from previous day: 11/29 0701 - 11/30 0700 In: 3493.4 [P.O.:500; I.V.:2753.4] Out: 1345 [Urine:740; Emesis/NG output:605] Intake/Output this shift: No intake/output data recorded.  General appearance: alert, cooperative and no distress Resp: clear to auscultation bilaterally GI: soft, not very distened to my exam.  + BS.    Lab Results:   Recent Labs  06/08/16 0627 06/09/16 0610  WBC 7.4 6.4  HGB 11.5* 10.5*  HCT 34.8* 31.2*  PLT 120* 94*    BMET  Recent Labs  06/08/16 0627 06/09/16 0610  NA 143 136  K 3.9 3.6  CL 113* 106  CO2 23 23  GLUCOSE 151* 141*  BUN 19 15  CREATININE 1.16 0.92  CALCIUM 8.2* 7.5*   PT/INR  Recent Labs  06/07/16 1100  LABPROT 14.6  INR 1.13     Recent Labs Lab 06/05/16 2353  AST 22  ALT 34  ALKPHOS 58  BILITOT 0.7  PROT 7.0  ALBUMIN 4.1     Lipase     Component Value Date/Time   LIPASE 42 06/05/2016 2353     Studies/Results: Dg Abd 1 View  Result Date: 06/09/2016 CLINICAL DATA:  Followup Small-bowel obstruction EXAM: ABDOMEN - 1 VIEW COMPARISON:  06/08/2016 FINDINGS: Scattered large and small bowel gas is seen. The small bowel dilatation noted on the prior exam has resolved. No free air is seen. Stable degenerative change of the lumbar spine is noted. Nasogastric catheter is noted within the stomach. IMPRESSION: Resolution of previously seen small  bowel obstruction. Electronically Signed   By: Inez Catalina M.D.   On: 06/09/2016 07:29   Dg Abd 2 Views  Result Date: 06/08/2016 CLINICAL DATA:  Followup small bowel obstruction. EXAM: ABDOMEN - 2 VIEW COMPARISON:  06/07/2016 FINDINGS: Nasogastric tube remains in place, tip in the stomach in side hole above the diaphragm. There is less dilated small intestine but not complete resolution. There is gas within the colon. No sign of free air. IMPRESSION: Persistent but improved small bowel obstruction pattern. Electronically Signed   By: Nelson Chimes M.D.   On: 06/08/2016 10:28   Dg Abd 2 Views  Result Date: 06/07/2016 CLINICAL DATA:  Abdominal distention. Follow-up small bowel obstruction EXAM: ABDOMEN - 2 VIEW COMPARISON:  06/06/2016 FINDINGS: Increasing small bowel dilatation since prior study. Findings concerning for worsening small bowel obstruction. Gas also noted within the colon. No free air organomegaly. NG tube tip is in the proximal stomach with the side port in the distal esophagus. IMPRESSION: Increasing gaseous distention of small bowel loops with scattered air-fluid levels. Findings compatible with slight worsening in small bowel obstruction since prior study. Electronically Signed   By: Rolm Baptise M.D.   On: 06/07/2016 11:26    Medications: . clindamycin (CLEOCIN) IV  900 mg Intravenous On Call to OR   And  . gentamicin  5 mg/kg (Adjusted) Intravenous On Call to OR  .  desmopressin  0.52 mcg Subcutaneous Daily  . dexamethasone  4 mg Intravenous Q6H  . pantoprazole (PROTONIX) IV  40 mg Intravenous Q24H   . dextrose 100 mL/hr at 06/09/16 0700   Assessment/Plan PSBO with inflammatory changes with thickening and edema distal ileum Negative C diff and GI panel today Hx of IBS, previously improved with Bentyl - currently ineffective  Urinary retention Diabetes inspisdus Mild renal insuffiencey - Improving Hypernatremia - improving Prior appendectomy and bilateral inguinal  hernia repairs Stage IV B, non small cell lung cancer - mediastinal and brain metastasis - on steroids Stage IIA multiple myeloma  Hx of skin Melanoma Hx of PE/DVT - on anticoagulation since 02/07/14 - Xarelto, last dose 06/05/16, 10:30 AM - Heparin drip 06/08/16 Weight loss FEN: NPO ID: No antibiotics DVT: Xarelto, last dose 06/05/16 10:30 AM/heparin drip on hold    Plan:  Reviewing films with Dr. Excell Seltzer.  He has examined patient, and does not think he needs surgery at this time.  His biggest complaint is the NG.  We will pull NG and try him on sips of clears.  Ask GI to review and consider colonoscopy and distal ileum evaluation.     LOS: 3 days    Coraline Talwar 06/09/2016 [phone]

## 2016-06-09 NOTE — Anesthesia Preprocedure Evaluation (Deleted)
Anesthesia Evaluation  Patient identified by MRN, date of birth, ID band Patient awake    Reviewed: Allergy & Precautions, H&P , NPO status , Patient's Chart, lab work & pertinent test results  Airway Mallampati: II  TM Distance: >3 FB Neck ROM: Full    Dental no notable dental hx. (+) Teeth Intact, Dental Advisory Given   Pulmonary neg pulmonary ROS, former smoker, PE   Pulmonary exam normal breath sounds clear to auscultation       Cardiovascular + Peripheral Vascular Disease   Rhythm:Regular Rate:Normal     Neuro/Psych  Headaches, negative psych ROS   GI/Hepatic Neg liver ROS, hiatal hernia, PUD, GERD  Medicated and Controlled,  Endo/Other  negative endocrine ROS  Renal/GU negative Renal ROS  negative genitourinary   Musculoskeletal  (+) Arthritis , Osteoarthritis,    Abdominal   Peds  Hematology negative hematology ROS (+) anemia , Multiple myeloma   Anesthesia Other Findings   Reproductive/Obstetrics negative OB ROS                             Anesthesia Physical  Anesthesia Plan  ASA: III and emergent  Anesthesia Plan: General   Post-op Pain Management:    Induction: Intravenous, Rapid sequence and Cricoid pressure planned  Airway Management Planned: Oral ETT  Additional Equipment:   Intra-op Plan:   Post-operative Plan: Extubation in OR  Informed Consent: I have reviewed the patients History and Physical, chart, labs and discussed the procedure including the risks, benefits and alternatives for the proposed anesthesia with the patient or authorized representative who has indicated his/her understanding and acceptance.   Dental advisory given  Plan Discussed with: CRNA  Anesthesia Plan Comments:         Anesthesia Quick Evaluation

## 2016-06-09 NOTE — Progress Notes (Addendum)
[facility] Progress Note  Chief Complaint:   Bowel obstruction  Subjective: States he feels unchanged, no flatus / BMs. Thinks he needs surgery  Objective:  Vital signs in last 24 hours: Temp:  [97.7 F (36.5 C)-98 F (36.7 C)] 98 F (36.7 C) (11/30 0602) Pulse Rate:  [70-83] 70 (11/30 0602) Resp:  [18-20] 20 (11/30 0602) BP: (112-128)/(67-85) 128/70 (11/30 0602) SpO2:  [94 %-100 %] 100 % (11/30 0602) Last BM Date: 06/06/16 General:   Alert, well-developed, white male in NAD EENT:  Normal hearing, non icteric sclera, conjunctive pink.  Heart:  Regular rate and rhythm; no lower extremity edema Pulm: Normal respiratory effort. Abdomen:  Soft, minimal distention, nontender. NGT suction sounds.     Neurologic:  Alert and  oriented x4;  grossly normal neurologically. Psych:  Alert and cooperative. Normal mood and affect.   Intake/Output from previous day: 11/29 0701 - 11/30 0700 In: 3493.4 [P.O.:500; I.V.:2753.4] Out: 1345 [Urine:740; Emesis/NG output:605] Intake/Output this shift: No intake/output data recorded.  Lab Results:  Recent Labs  06/07/16 0418 06/08/16 0627 06/09/16 0610  WBC 5.9 7.4 6.4  HGB 12.9* 11.5* 10.5*  HCT 38.5* 34.8* 31.2*  PLT 149* 120* 94*   BMET  Recent Labs  06/07/16 1800 06/08/16 0627 06/09/16 0610  NA 149* 143 136  K 4.4 3.9 3.6  CL 120* 113* 106  CO2 '24 23 23  '$ GLUCOSE 158* 151* 141*  BUN '18 19 15  '$ CREATININE 1.28* 1.16 0.92  CALCIUM 8.8* 8.2* 7.5*   PT/INR  Recent Labs  06/07/16 1100  LABPROT 14.6  INR 1.13   Dg Abd 1 View  Result Date: 06/09/2016 CLINICAL DATA:  Followup Small-bowel obstruction EXAM: ABDOMEN - 1 VIEW COMPARISON:  06/08/2016 FINDINGS: Scattered large and small bowel gas is seen. The small bowel dilatation noted on the prior exam has resolved. No free air is seen. Stable degenerative change of the lumbar spine is noted. Nasogastric catheter is noted within the stomach. IMPRESSION:  Resolution of previously seen small bowel obstruction. Electronically Signed   By: Inez Catalina M.D.   On: 06/09/2016 07:29    Assessment / Plan:  1. SBO. Today's films show resolution of obstruction. Only 666m NGT output yesterday but patient states he feels the same and no flatus.  Surgery not planning to operate. Clamping NGT and giving clears. Gi asked to consider colonoscopy with TI evaluation. Dr. PHenrene Pastorreviewed images with Radiologist a couple of days ago and the distal ileum appeared normal. Recommend clamping NGT, see how he does with sips and obtain repeat films in am. He may need CT enterography, will decide on that in the am  2. Stage IV non small cell lung cancer with brain mets  3. Hx of PE, xarelto on hold. On heparin gtt (currently on hold)  4. Thrombocytopenia, progressive. Platelets 94 today. Has been on heparin.   LOS: 3 days   PTye Savoy 06/09/2016, 11:42 AM Pager number [phone] GI ATTENDING  Interval history data reviewed. Patient seen and examined personally. Agree with interval progress note. Obstructive pattern resolved on x-ray. NG tube clamped. Planning to see how he tolerates this. Diet as well as. If he does well with no evidence of recurrent bowel obstruction, I agree with small bowel evaluation. The transition point in the ileum appeared to be proximal to the most distal ileum. Thus, unreliably assessed by colonoscopy. The best way to comprehensively assess his small bowel would be CT enterography. I would  recommend performing CT enterography after we are certain that his obstruction is resolved. We'll continue to follow.  Docia Chuck. Geri Seminole., M.D. [facility] Division of Gastroenterology

## 2016-06-10 ENCOUNTER — Inpatient Hospital Stay (HOSPITAL_COMMUNITY): Payer: Medicare Other

## 2016-06-10 ENCOUNTER — Encounter (HOSPITAL_COMMUNITY): Payer: Self-pay

## 2016-06-10 DIAGNOSIS — D696 Thrombocytopenia, unspecified: Secondary | ICD-10-CM

## 2016-06-10 DIAGNOSIS — G9341 Metabolic encephalopathy: Secondary | ICD-10-CM

## 2016-06-10 LAB — BASIC METABOLIC PANEL
ANION GAP: 7 (ref 5–15)
BUN: 11 mg/dL (ref 6–20)
CALCIUM: 7.5 mg/dL — AB (ref 8.9–10.3)
CO2: 24 mmol/L (ref 22–32)
CREATININE: 0.67 mg/dL (ref 0.61–1.24)
Chloride: 94 mmol/L — ABNORMAL LOW (ref 101–111)
GFR calc Af Amer: >60 mL/min (ref >60)
GFR calc non Af Amer: >60 mL/min (ref >60)
GLUCOSE: 125 mg/dL — AB (ref 65–99)
Potassium: 3.4 mmol/L — ABNORMAL LOW (ref 3.5–5.1)
Sodium: 125 mmol/L — ABNORMAL LOW (ref 135–145)

## 2016-06-10 LAB — URINE MICROSCOPIC-ADD ON

## 2016-06-10 LAB — CBC
HCT: 26.9 % — ABNORMAL LOW (ref 39.0–52.0)
Hemoglobin: 9.5 g/dL — ABNORMAL LOW (ref 13.0–17.0)
MCH: 32.6 pg (ref 26.0–34.0)
MCHC: 35.3 g/dL (ref 30.0–36.0)
MCV: 92.4 fL (ref 78.0–100.0)
PLATELETS: 72 10*3/uL — AB (ref 150–400)
RBC: 2.91 MIL/uL — ABNORMAL LOW (ref 4.22–5.81)
RDW: 15.8 % — AB (ref 11.5–15.5)
WBC: 4.9 10*3/uL (ref 4.0–10.5)

## 2016-06-10 LAB — URINALYSIS, ROUTINE W REFLEX MICROSCOPIC
Bilirubin Urine: NEGATIVE
GLUCOSE, UA: NEGATIVE mg/dL
KETONES UR: NEGATIVE mg/dL
LEUKOCYTES UA: NEGATIVE
Nitrite: NEGATIVE
PH: 6.5 (ref 5.0–8.0)
Protein, ur: NEGATIVE mg/dL
Specific Gravity, Urine: 1.016 (ref 1.005–1.030)

## 2016-06-10 LAB — GLUCOSE, CAPILLARY
Glucose-Capillary: 138 mg/dL — ABNORMAL HIGH (ref 65–99)
Glucose-Capillary: 133 mg/dL — ABNORMAL HIGH (ref 65–99)
Glucose-Capillary: 130 mg/dL — ABNORMAL HIGH (ref 65–99)
Glucose-Capillary: 140 mg/dL — ABNORMAL HIGH (ref 65–99)

## 2016-06-10 LAB — HEPARIN LEVEL (UNFRACTIONATED): HEPARIN UNFRACTIONATED: 0.28 [IU]/mL — AB (ref 0.30–0.70)

## 2016-06-10 MED ORDER — IOPAMIDOL (ISOVUE-300) INJECTION 61%
100.0000 mL | Freq: Once | INTRAVENOUS | Status: AC | PRN
  Administered 2016-06-10: 100 mL via INTRAVENOUS

## 2016-06-10 MED ORDER — HEPARIN (PORCINE) IN NACL 100-0.45 UNIT/ML-% IJ SOLN
1000.0000 [IU]/h | INTRAMUSCULAR | Status: DC
  Administered 2016-06-10: 1000 [IU]/h via INTRAVENOUS
  Filled 2016-06-10: qty 250

## 2016-06-10 MED ORDER — KCL IN DEXTROSE-NACL 20-5-0.9 MEQ/L-%-% IV SOLN
INTRAVENOUS | Status: DC
  Administered 2016-06-10: 17:00:00 via INTRAVENOUS
  Filled 2016-06-10 (×3): qty 1000

## 2016-06-10 NOTE — Progress Notes (Signed)
ANTICOAGULATION CONSULT NOTE - Follow up [place] for Heparin Indication: History of PE/DVT, Xarelto on hold  Patient Measurements: Height: '5\' 9"'$  (175.3 cm) Weight: 208 lb 8.9 oz (94.6 kg) IBW/kg (Calculated) : 70.7 Heparin Dosing Weight: 90kg  Vital Signs: Temp: 98.6 F (37 C) (12/01 0450) Temp Source: Oral (12/01 0450) BP: 102/41 (12/01 0450) Pulse Rate: 61 (12/01 0450)  Labs:  Recent Labs  06/07/16 1100  06/07/16 1800  06/08/16 0627 06/08/16 1800 06/09/16 0610 06/09/16 2250 06/10/16 0601  HGB  --   --   --   < > 11.5*  --  10.5*  --  9.5*  HCT  --   --   --   --  34.8*  --  31.2*  --  26.9*  PLT  --   --   --   --  120*  --  94*  --  72*  APTT 22*  --   --   --   --   --   --   --   --   LABPROT 14.6  --   --   --   --   --   --   --   --   INR 1.13  --   --   --   --   --   --   --   --   HEPARINUNFRC <0.10*  --   --   < > 0.75* 1.09* 0.36 0.19*  --   CREATININE  --   < > 1.28*  --  1.16  --  0.92  --   --   < > = values in this interval not displayed.  Estimated Creatinine Clearance: 78.8 mL/min (by C-G formula based on SCr of 0.92 mg/dL).  Medications:  Scheduled:  . desmopressin  0.52 mcg Subcutaneous Daily  . dexamethasone  4 mg Intravenous Q6H  . pantoprazole (PROTONIX) IV  40 mg Intravenous Q24H   Infusions:  . dextrose 1,000 mL (06/09/16 2106)  . heparin 1,000 Units/hr (06/10/16 0011)    Assessment: 44 yoM admitted on 11/27 with suspected SBO.  PMH includes chronic Xarelto anticoagulation for history of PE/DVT.  Xarelto was held on admission for NPO status (NG tube to suction) and possible surgical procedures.  Pharmacy is now consulted to dose Heparin IV.  PTA rivaroxaban '20mg'$  PO daily - last dose on 11/26 at 08:30.  Significant events: 11/30 Heparin was held at 07:00 for possible surgery.  Dr. Excell Seltzer has examined patient, and does not think he needs surgery at this time.  Pharmacy is consulted to resume Heparin IV at 14:00.   Heparin was off for ~ 7 hours.  Today, 06/10/2016:  Heparin level this morning canceled and moved to this afternoon.  Previous rate increase for low heparin level was not changed at midnight as ordered, but was changed on 12/1 about 07:00.  Note previous levels elevated:  HL 1.09 on Heparin at 1250 units/hr on 11/29 PM despite decreasing infusion rates. RN reports IV heparin is infusing peripherally, therefore, the heparin level that was drawn through the port is accurate.  CBC: Hgb continues to decrease (14.7 > 12.9 > 11.5 > 10.5 > 9.5), Plt decreased to 72 (baseline Plt 120-170's)  Discussed Hgb and Plt decrease with Dr. Dyann Kief.  No bleeding, bruising, or s/s bleeding noted.  Vital signs stable.  We will continue to monitor closely and f/u CT scan on 12/1.  No bleeding reported per discussion with RN  SCr is improved with CrCl ~ 62 ml/min   Goal of Therapy:  Heparin level 0.3-0.7 units/ml aPTT 66-102 seconds Monitor platelets by anticoagulation protocol: Yes   Plan:   Continue heparin IV infusion at 1000 units/hr  Heparin level in 8 hours, next due at 1500  Daily heparin level and CBC  Continue to closely monitor H&H and platelets, s/s bleeding   Gretta Arab PharmD, BCPS Pager [phone] 06/10/2016 7:10 AM

## 2016-06-10 NOTE — Progress Notes (Signed)
PROGRESS NOTE    [name]  VOH:[id number] DOB: March 10, 1941 DOA: 06/05/2016 PCP: [name], MD   Brief Narrative: 75 y.o. male with a past medical history significant for metastatic lung cancer, PE on Xarelto who presents with intermittent abdominal pain and vomiting.CT of the abdomen showed small bowel obstruction with transition point near an area of "inflammatory process".Placement of NG tube failed, and TRH were asked to evaluate for admission for SBO.  (The patient was first diagnosed with cancer in 2013, with multiple myeloma. He underwent 2 rounds of Velcade, Revlimid, carfilzomib then had a BMT at [facility].  Then in 2017 he was diagnosed with brain metastases, and found to have a lung primary.  He has undergone skull radiation and is about to start [place] this Tuesday.)  Assessment & Plan: # Small bowel obstruction: -will advance diet to CLD -Repeat abdominal x-ray of abdomen demonstrated resolution of obstruction; holding surgery now. -Continue IV dilaudid if severe pain present and IV zofran -continue supportive care.  -Follow-up with Gen. surgery and GI for further plan and recommendation. -improvement in x-ray and apparently resolution of obstruction.  -NGT removed and well tolerated -CT enterography with confirmation on resolution of SBO; demonstrating some area of inflammation/narrowing around ileum; will follow GI rec's.  # Hypokalemia/hypomagnesemia:   -most likely associated with NGT -will monitor and replete as needed   #hypernatremia: Patient reported that he was diagnosed with diabetes insipidus and takes DDAVP at night.  -resolved and in fact now with mild hyponatremia -will adjust IVF's  #Acute kidney injury likely due to dehydration and urinary retention: -Patient had urinary retention yesterday with more than a liter of urine in bladder scan. Status post Foley catheter insertion with increased urinary output.  -Continue IV fluid and follow renal function  trend -Serum creatinine level back to normal range after fluids resuscitation.  #  History of pulmonary embolism:  -will hold for now due to drift on Hgb and platelets  #thombocytopenia -no signs of bleeding -but now in 70,000 range -will hold heparin drip  #  Brain metastases (HCC) -Adenocarcinoma of right lung, stage 4 ([place]): on decadron IV and PPI.  -will continue   # Acute urinary retention -will replace foley -will check UAa nd urine cx  # Metabolic encephalopathy -will r/o UTI -replete electrolytes -minimize narcotics and benzos -follow clinical response -possible hospital acquired delirium; discussed with family about constant reorientation     DVT prophylaxis:SCD Code Status:full Family Communication:no Disposition Plan:to be determine. Patient feeling better, no CP, no SOB and denies abd pain. Per x-ray and CT enterography no SBO seen. Patient with focal narrowing around ileum. Will advance diet and work up his confusion.  Consultants:   Surgery team  GI  Procedures:  see below for x-ray reports   Antimicrobials:   none  Subjective:  No fever, no CP, no SOB. Patient with some confusion today, s/p NGT removal and no complaining of abd pain  Objective: Vitals:   06/09/16 2110 06/10/16 0450 06/10/16 1359 06/10/16 1638  BP: 114/75 (!) 102/41 123/73 116/82  Pulse: 70 61 65 (!) 57  Resp: _0 Temp: 97.5 F (36.4 C) 98.6 F (37 C) 98.8 F (37.1 C) 97.8 F (36.6 C)  TempSrc: Oral Oral Oral Oral  SpO2: 99% 100% 98% 96%  Weight:      Height:        Intake/Output Summary (Last 24 hours) at 06/10/16 1720 Last data filed at 06/10/16 1358  Gross per  24 hour  Intake          3183.67 ml  Output             1220 ml  Net          1963.67 ml   Filed Weights   06/06/16 0900  Weight: 94.6 kg (208 lb 8.9 oz)    Examination:  General exam: Pleasant male laying on bed and confused; no fever. Denies Ha, nausea, vomtiing or any pain currently. NGT  removed. With some issues urinating. Passing gas, but no BM's yet Respiratory system: Clear bilateral, no wheezing or crackles Cardiovascular system: Regular rate rhythm, S1 and S2 normal. No pedal edema Gastrointestinal system: Abdomen soft, ND, no tenderness. Bowel sound positive. Central nervous system: Alert awake, oriented. No focal neurological deficit Extremities: Symmetric 5 x 5 power. Skin: No rashes, lesions or ulcers Psychiatry: Judgement and insight appear normal. Mood & affect appropriate.    Data Reviewed: I have personally reviewed following labs and imaging studies  CBC:  Recent Labs Lab 06/05/16 2353 06/07/16 0418 06/08/16 0627 06/09/16 0610 06/10/16 0601  WBC 9.6 5.9 7.4 6.4 4.9  NEUTROABS 8.5*  --   --   --   --   HGB 14.7 12.9* 11.5* 10.5* 9.5*  HCT 41.1 38.5* 34.8* 31.2* 26.9*  MCV 90.9 96.3 95.3 96.0 92.4  PLT 181 149* 120* 94* 72*   Basic Metabolic Panel:  Recent Labs Lab 06/06/16 0833 06/07/16 0418 06/07/16 1255 06/07/16 1800 06/08/16 0627 06/09/16 0610 06/10/16 1202  NA 136 154* 151* 149* 143 136 125*  K 3.1* 4.7 4.3 4.4 3.9 3.6 3.4*  CL 102 124* 121* 120* 113* 106 94*  CO2 _0 GLUCOSE 114* 138* 133* 158* 151* 141* 125*  BUN _1 CREATININE 1.58* 1.18 1.29* 1.28* 1.16 0.92 0.67  CALCIUM 9.3 9.1 9.1 8.8* 8.2* 7.5* 7.5*  MG 1.6* 2.4  --   --   --   --   --    GFR: Estimated Creatinine Clearance: 90.6 mL/min (by C-G formula based on SCr of 0.67 mg/dL).   Liver Function Tests:  Recent Labs Lab 06/05/16 2353  AST 22  ALT 34  ALKPHOS 58  BILITOT 0.7  PROT 7.0  ALBUMIN 4.1    Recent Labs Lab 06/05/16 2353  LIPASE 42   Coagulation Profile:  Recent Labs Lab 06/07/16 1100  INR 1.13   Cardiac Enzymes:  Recent Labs Lab 06/05/16 2353  TROPONINI <0.03   CBG:  Recent Labs Lab 06/09/16 1644 06/09/16 2228 06/10/16 0836 06/10/16 1217 06/10/16 1657  GLUCAP 140* 129* 138* 133* 130*     Recent Results (from the past 240 hour(s))  C difficile quick scan w PCR reflex     Status: None   Collection Time: 06/06/16  4:07 AM  Result Value Ref Range Status   C Diff antigen NEGATIVE NEGATIVE Final   C Diff toxin NEGATIVE NEGATIVE Final   C Diff interpretation No C. difficile detected.  Final  Gastrointestinal Panel by PCR , Stool     Status: None   Collection Time: 06/06/16  4:07 AM  Result Value Ref Range Status   Campylobacter species NOT DETECTED NOT DETECTED Final   Plesimonas shigelloides NOT DETECTED NOT DETECTED Final   Salmonella species NOT DETECTED NOT DETECTED Final   Yersinia enterocolitica NOT DETECTED NOT DETECTED Final   Vibrio species NOT DETECTED NOT DETECTED Final  Vibrio cholerae NOT DETECTED NOT DETECTED Final   Enteroaggregative E coli (EAEC) NOT DETECTED NOT DETECTED Final   Enteropathogenic E coli (EPEC) NOT DETECTED NOT DETECTED Final   Enterotoxigenic E coli (ETEC) NOT DETECTED NOT DETECTED Final   Shiga like toxin producing E coli (STEC) NOT DETECTED NOT DETECTED Final   Shigella/Enteroinvasive E coli (EIEC) NOT DETECTED NOT DETECTED Final   Cryptosporidium NOT DETECTED NOT DETECTED Final   Cyclospora cayetanensis NOT DETECTED NOT DETECTED Final   Entamoeba histolytica NOT DETECTED NOT DETECTED Final   Giardia lamblia NOT DETECTED NOT DETECTED Final   Adenovirus F40/41 NOT DETECTED NOT DETECTED Final   Astrovirus NOT DETECTED NOT DETECTED Final   Norovirus GI/GII NOT DETECTED NOT DETECTED Final   Rotavirus A NOT DETECTED NOT DETECTED Final   Sapovirus (I, II, IV, and V) NOT DETECTED NOT DETECTED Final  Surgical PCR screen     Status: None   Collection Time: 06/09/16  7:54 AM  Result Value Ref Range Status   MRSA, PCR NEGATIVE NEGATIVE Final   Staphylococcus aureus NEGATIVE NEGATIVE Final    Comment:        The Xpert SA Assay (FDA approved for NASAL specimens in patients over 54 years of age), is one component of a comprehensive  surveillance program.  Test performance has been validated by [facility] for patients greater than or equal to 34 year old. It is not intended to diagnose infection nor to guide or monitor treatment.      Radiology Studies: Dg Abd 1 View  Result Date: 06/09/2016 CLINICAL DATA:  Followup Small-bowel obstruction EXAM: ABDOMEN - 1 VIEW COMPARISON:  06/08/2016 FINDINGS: Scattered large and small bowel gas is seen. The small bowel dilatation noted on the prior exam has resolved. No free air is seen. Stable degenerative change of the lumbar spine is noted. Nasogastric catheter is noted within the stomach. IMPRESSION: Resolution of previously seen small bowel obstruction. Electronically Signed   By: Inez Catalina M.D.   On: 06/09/2016 07:29   Ct Entero Abd/pelvis W Contast  Result Date: 06/10/2016 CLINICAL DATA:  History of small-bowel obstruction. EXAM: CT ABDOMEN AND PELVIS WITH CONTRAST (ENTEROGRAPHY) TECHNIQUE: Multidetector CT of the abdomen and pelvis during bolus administration of intravenous contrast. Negative oral contrast was given. CONTRAST:  145m ISOVUE-300 IOPAMIDOL (ISOVUE-300) INJECTION 61% COMPARISON:  CT scan 06/06/2016 FINDINGS: Lower chest: Small right pleural effusion and minimal bibasilar atelectasis. No pericardial effusion. The distal esophagus is grossly normal. Hepatobiliary: No focal hepatic lesions or intrahepatic biliary dilatation. There is a fundal nodule associate with the gallbladder. This was also present on a prior CT scan from 2014. This most likely a small fundal adenomyoma. High attenuation material layering in the gallbladder could be sludge or contrast from vicarious excretion. Pancreas: No mass, inflammation or ductal dilatation. Spleen: Normal size.  No focal lesions. Adrenals/Urinary Tract: The adrenal glands are unremarkable and stable. The kidneys are unremarkable. Stable scarring changes involving the left kidney. No hydronephrosis. Stomach/Bowel: The stomach  and duodenum are unremarkable. No findings for small bowel obstruction or free air. In the right lower quadrant/ right pelvis there is an area of focal bowel wall thickening and slight constriction and enhancement(axial images 68 an 69 and coronal image 47). This is the same area identified on the prior CT scan. It could be a focal area of enteritis or strictured narrowing related to prior inflammatory process. No current small bowel obstruction is identified however. Vascular/Lymphatic: No pathologically enlarged lymph nodes. No evidence  of abdominal aortic aneurysm Reproductive: Mildly enlarged prostate gland. Other: No pelvic mass or adenopathy. No free pelvic fluid collections. The bladder is decompressed by Foley catheter. No inguinal mass or adenopathy. Musculoskeletal: No significant bony findings. Diffuse osteoporosis and degenerative changes again noted. IMPRESSION: Focal area of narrowing, wall thickening and enhancement in the distal ileum in the right pelvis correlating with the prior CT scan. This could be a small focus of active inflammation or area of constriction from prior inflammatory process. No current small bowel obstruction. Electronically Signed   By: Marijo Sanes M.D.   On: 06/10/2016 17:00   Dg Abd 2 Views  Result Date: 06/10/2016 CLINICAL DATA:  Small-bowel obstruction.  Follow-up exam . EXAM: ABDOMEN - 2 VIEW COMPARISON:  06/09/2016. FINDINGS: Soft tissue structures are unremarkable. Several loops of distended small bowel are noted on today's exam. Colonic gas pattern is normal. These findings are consistent with partial small bowel obstruction. No free air. PowerPort catheter noted with tip projected over right atrium. Diffuse degenerative changes thoracolumbar spine. Multiple stable mild compression fractures. Degenerative changes both hips. IMPRESSION: Several slightly distended loops small bowel noted on today's exam. Colonic gas pattern is normal. Findings suggest partial small  bowel obstruction. No free air. Electronically Signed   By: [place]   On: 06/10/2016 08:37    Scheduled Meds: . desmopressin  0.52 mcg Subcutaneous Daily  . dexamethasone  4 mg Intravenous Q6H  . pantoprazole (PROTONIX) IV  40 mg Intravenous Q24H   Continuous Infusions: . dextrose 5 % and 0.9 % NaCl with KCl 20 mEq/L 75 mL/hr at 06/10/16 1647     LOS: 4 days   Barton Dubois, MD [facility] Pager [phone]  If 7PM-7AM, please contact night-coverage [email or web address] Password [email or web address] 06/10/2016, 5:20 PM

## 2016-06-10 NOTE — Care Management Important Message (Signed)
Important Message  Patient Details  Name: [name] MRN: [id number] Date of Birth: 09-22-1940   Medicare Important Message Given:  Yes    Camillo Flaming 06/10/2016, 12:22 [place] Message  Patient Details  Name: [name] MRN: [id number] Date of Birth: 08-04-1940   Medicare Important Message Given:  Yes    Camillo Flaming 06/10/2016, 12:22 PM

## 2016-06-10 NOTE — Progress Notes (Signed)
Patient failed voiding trial, bladder scan was and shows 550 cc of urine. In and out  catheter was performed and 400 cc of urine was taken out. Patient tolerated the procedure and we will continue to monitor.

## 2016-06-10 NOTE — Progress Notes (Signed)
     [facility] Progress Note  Chief Complaint:    Bowel obstruction  Subjective: feels like a million bucks. No abdominal pain. No nausea, no BMs but some flatus.   Objective:  Vital signs in last 24 hours: Temp:  [97.5 F (36.4 C)-98.6 F (37 C)] 98.6 F (37 C) (12/01 0450) Pulse Rate:  [61-70] 61 (12/01 0450) Resp:  [18] 18 (12/01 0450) BP: (102-114)/(41-75) 102/41 (12/01 0450) SpO2:  [96 %-100 %] 100 % (12/01 0450) Last BM Date: 06/06/16 General:   Alert, well-developed,white male  in NAD EENT:  Normal hearing, non icteric sclera, conjunctive pink.  Heart:  Regular rate and rhythm, no lower extremity edema Abdomen:  Soft, nondistended, nontender.  Normal bowel sounds    Neurologic:  Alert and  oriented x4;  grossly normal neurologically. Psych:  Alert and cooperative. Normal mood and affect. Skin:   Intake/Output from previous day: 11/30 0701 - 12/01 0700 In: 2943.7 [P.O.:540; I.V.:2403.7] Out: 900 [Urine:900] Intake/Output this shift: Total I/O In: 240 [P.O.:240] Out: 0   Lab Results:  Recent Labs  06/08/16 0627 06/09/16 0610 06/10/16 0601  WBC 7.4 6.4 4.9  HGB 11.5* 10.5* 9.5*  HCT 34.8* 31.2* 26.9*  PLT 120* 94* 72*   BMET  Recent Labs  06/07/16 1800 06/08/16 0627 06/09/16 0610  NA 149* 143 136  K 4.4 3.9 3.6  CL 120* 113* 106  CO2 '24 23 23  '$ GLUCOSE 158* 151* 141*  BUN '18 19 15  '$ CREATININE 1.28* 1.16 0.92  CALCIUM 8.8* 8.2* 7.5*   PT/INR  Recent Labs  06/07/16 1100  LABPROT 14.6  INR 1.13     Dg Abd 2 Views  Result Date: 06/10/2016 CLINICAL DATA:  Small-bowel obstruction.  Follow-up exam . EXAM: ABDOMEN - 2 VIEW COMPARISON:  06/09/2016. FINDINGS: Soft tissue structures are unremarkable. Several loops of distended small bowel are noted on today's exam. Colonic gas pattern is normal. These findings are consistent with partial small bowel obstruction. No free air. PowerPort catheter noted with tip projected over right  atrium. Diffuse degenerative changes thoracolumbar spine. Multiple stable mild compression fractures. Degenerative changes both hips. IMPRESSION: Several slightly distended loops small bowel noted on today's exam. Colonic gas pattern is normal. Findings suggest partial small bowel obstruction. No free air. Electronically Signed   By: Marcello Moores  Register   On: 06/10/2016 08:37   Assessment / Plan:  1. SBO, resolving. Mildly dilated loops of small bowel on today's films but he looks good and feels "like a million bucks today".  Discussed today's xrays with Surgery (Dr. Excell Seltzer) who is underwhelmed at findings. Will proceed with CTenterography.   2. Stage IV non small cell lung cancer with brain mets  3. Thrombocytopenia, platelets continue to fall, now at 72. Etiology? He is getting IV heparin    LOS: 4 days   Tye Savoy  06/10/2016, 10:21 AM Pager number [phone]  GI ATTENDING  Interval history data reviewed. Patient seen and examined. Family in room. Had some transient confusion earlier. Improving now. Abdomen feeling better this morning. Patient is for CT enterography (now completed but not interpreted). If CT negative, hopefully can advance diet. GI will follow-up tomorrow.  Docia Chuck. Geri Seminole., M.D. [facility] Division of Gastroenterology

## 2016-06-10 NOTE — Progress Notes (Signed)
Pharmacy - IV heparin  Assessment:    Please see note from Gretta Arab, PharmD earlier today for full details.  Briefly, 75 y.o. male on IV heparin for Hx PE/DVT on Xarelto; bridging periprocedurally. Rate adjusted this AM. Note patient has history of heparin sensitivity in the past, and with Hgb/Plt slowly trending down   Heparin level slightly SUBtherapeutic at 0.28 after increasing to 1000 units/hr  Plan:   Although this does not appear to be an obvious case of HIT, MD would like to hold heparin for now and f/u CBC in the AM.  Reuel Boom, PharmD, BCPS Pager: [phone] 06/10/2016, 3:32 PM

## 2016-06-10 NOTE — Progress Notes (Signed)
ANTICOAGULATION CONSULT NOTE - Follow up [place] for Heparin Indication: History of PE/DVT, Xarelto on hold  Patient Measurements: Height: '5\' 9"'$  (175.3 cm) Weight: 208 lb 8.9 oz (94.6 kg) IBW/kg (Calculated) : 70.7 Heparin Dosing Weight: 90kg  Vital Signs: Temp: 97.5 F (36.4 C) (11/30 2110) Temp Source: Oral (11/30 2110) BP: 114/75 (11/30 2110) Pulse Rate: 70 (11/30 2110)  Labs:  Recent Labs  06/07/16 0418 06/07/16 1100  06/07/16 1800  06/08/16 0627 06/08/16 1800 06/09/16 0610 06/09/16 2250  HGB 12.9*  --   --   --   --  11.5*  --  10.5*  --   HCT 38.5*  --   --   --   --  34.8*  --  31.2*  --   PLT 149*  --   --   --   --  120*  --  94*  --   APTT  --  22*  --   --   --   --   --   --   --   LABPROT  --  14.6  --   --   --   --   --   --   --   INR  --  1.13  --   --   --   --   --   --   --   HEPARINUNFRC  --  <0.10*  --   --   < > 0.75* 1.09* 0.36 0.19*  CREATININE 1.18  --   < > 1.28*  --  1.16  --  0.92  --   < > = values in this interval not displayed.  Estimated Creatinine Clearance: 78.8 mL/min (by C-G formula based on SCr of 0.92 mg/dL).  Medications:  Scheduled:  . clindamycin (CLEOCIN) IV  900 mg Intravenous On Call to OR   And  . gentamicin  5 mg/kg (Adjusted) Intravenous On Call to OR  . desmopressin  0.52 mcg Subcutaneous Daily  . dexamethasone  4 mg Intravenous Q6H  . pantoprazole (PROTONIX) IV  40 mg Intravenous Q24H   Infusions:  . dextrose 1,000 mL (06/09/16 2106)  . heparin      Assessment: 41 yoM admitted on 11/27 with suspected SBO.  PMH includes chronic Xarelto anticoagulation for history of PE/DVT.  Xarelto was held on admission for NPO status (NG tube to suction) and possible surgical procedures.  Pharmacy is now consulted to dose Heparin IV.  PTA rivaroxaban '20mg'$  PO daily - last dose on 11/26 at 08:30.  11/30  Heparin level this morning was therapeutic at 0.36 on reduced rate infusion of 900 units/hr  Previous  levels elevated:  Most recently HL 1.09 on 11/29 PM despite decreasing infusion rates. RN reports IV heparin is infusing peripherally, therefore, the heparin level that was drawn through the port is accurate.  CBC: Hgb continues to decrease (14.7 > 12.9 > 11.5 > 10.5), Plt decreased to 94  No bleeding reported per discussion with RN, but she reports that he has some old bruising, nothing acute.  SCr is improving (1.28 > 1.58 > 1.18 > 0.92) with CrCl ~ 62 ml/min Today, 12/1  2250 HL=0.19 , no infusion problems or bleeding per RN, below goal -Will only increase by 100 units/hr, previous HL was supratherapeutic at 1100 units/hr?  Goal of Therapy:  Heparin level 0.3-0.7 units/ml aPTT 66-102 seconds Monitor platelets by anticoagulation protocol: Yes   Plan:   Increase heparin drip to 1000 units/hr  Recheck HL in 8 hours  Daily heparin level and CBC  Continue to closely monitor H&H and platelets, s/s bleeding  Dorrene German 06/10/2016 12:00 AM

## 2016-06-10 NOTE — Progress Notes (Signed)
Patient ID: [name], male   DOB: 09/05/1940, 75 y.o.   MRN: [id number]   Subjective: He says he feels great today. No nausea or abdominal pain.  Objective: Vital signs in last 24 hours: Temp:  [97.5 F (36.4 C)-98.6 F (37 C)] 98.6 F (37 C) (12/01 0450) Pulse Rate:  [61-70] 61 (12/01 0450) Resp:  [18] 18 (12/01 0450) BP: (102-114)/(41-75) 102/41 (12/01 0450) SpO2:  [96 %-100 %] 100 % (12/01 0450) Last BM Date: 06/06/16  Intake/Output from previous day: 11/30 0701 - 12/01 0700 In: 2943.7 [P.O.:540; I.V.:2403.7] Out: 900 [Urine:900] Intake/Output this shift: Total I/O In: 240 [P.O.:240] Out: 0   General appearance: alert, cooperative and no distress GI: Soft and nontender and nondistended  Lab Results:   Recent Labs  06/09/16 0610 06/10/16 0601  WBC 6.4 4.9  HGB 10.5* 9.5*  HCT 31.2* 26.9*  PLT 94* 72*   BMET  Recent Labs  06/08/16 0627 06/09/16 0610  NA 143 136  K 3.9 3.6  CL 113* 106  CO2 23 23  GLUCOSE 151* 141*  BUN 19 15  CREATININE 1.16 0.92  CALCIUM 8.2* 7.5*     Studies/Results: Dg Abd 1 View  Result Date: 06/09/2016 CLINICAL DATA:  Followup Small-bowel obstruction EXAM: ABDOMEN - 1 VIEW COMPARISON:  06/08/2016 FINDINGS: Scattered large and small bowel gas is seen. The small bowel dilatation noted on the prior exam has resolved. No free air is seen. Stable degenerative change of the lumbar spine is noted. Nasogastric catheter is noted within the stomach. IMPRESSION: Resolution of previously seen small bowel obstruction. Electronically Signed   By: Inez Catalina M.D.   On: 06/09/2016 07:29   Dg Abd 2 Views  Result Date: 06/10/2016 CLINICAL DATA:  Small-bowel obstruction.  Follow-up exam . EXAM: ABDOMEN - 2 VIEW COMPARISON:  06/09/2016. FINDINGS: Soft tissue structures are unremarkable. Several loops of distended small bowel are noted on today's exam. Colonic gas pattern is normal. These findings are consistent with partial small bowel  obstruction. No free air. PowerPort catheter noted with tip projected over right atrium. Diffuse degenerative changes thoracolumbar spine. Multiple stable mild compression fractures. Degenerative changes both hips. IMPRESSION: Several slightly distended loops small bowel noted on today's exam. Colonic gas pattern is normal. Findings suggest partial small bowel obstruction. No free air. Electronically Signed   By: Marcello Moores  Register   On: 06/10/2016 08:37    Anti-infectives: Anti-infectives    Start     Dose/Rate Route Frequency Ordered Stop   06/09/16 1000  clindamycin (CLEOCIN) IVPB 900 mg  Status:  Discontinued     900 mg 100 mL/hr over 30 Minutes Intravenous On call to O.R. 06/09/16 0734 06/10/16 0735   06/09/16 1000  gentamicin (GARAMYCIN) 400 mg in dextrose 5 % 100 mL IVPB  Status:  Discontinued     5 mg/kg  80.3 kg (Adjusted) 110 mL/hr over 60 Minutes Intravenous On call to O.R. 06/09/16 0734 06/10/16 0735      Assessment/Plan: s/p Procedure(s): EXPLORATORY LAPAROTOMY He looks well today with no clinical evidence of obstruction despite very slight dilated small bowel loops on today's x-rays. I reviewed the films personally which do not seem impressive for obstruction. He has an abnormal section of terminal ileum of uncertain significance on his original CT. His obstruction appears to have clinically resolved. After discussion with GI we planned CT enterography to try to more fully evaluate that area. Further plans based on his clinical course and results of this x-ray. I discussed  with the patient and all questions were answered.   LOS: 4 days    Taaj Hurlbut T 06/10/2016

## 2016-06-11 DIAGNOSIS — E871 Hypo-osmolality and hyponatremia: Secondary | ICD-10-CM

## 2016-06-11 DIAGNOSIS — R933 Abnormal findings on diagnostic imaging of other parts of digestive tract: Secondary | ICD-10-CM

## 2016-06-11 LAB — CBC
HEMATOCRIT: 25.3 % — AB (ref 39.0–52.0)
Hemoglobin: 9.3 g/dL — ABNORMAL LOW (ref 13.0–17.0)
MCH: 32.3 pg (ref 26.0–34.0)
MCHC: 36.8 g/dL — ABNORMAL HIGH (ref 30.0–36.0)
MCV: 87.8 fL (ref 78.0–100.0)
PLATELETS: 72 10*3/uL — AB (ref 150–400)
RBC: 2.88 MIL/uL — AB (ref 4.22–5.81)
RDW: 15 % (ref 11.5–15.5)
WBC: 5.4 10*3/uL (ref 4.0–10.5)

## 2016-06-11 LAB — BASIC METABOLIC PANEL
ANION GAP: 7 (ref 5–15)
BUN: 10 mg/dL (ref 6–20)
CHLORIDE: 94 mmol/L — AB (ref 101–111)
CO2: 21 mmol/L — AB (ref 22–32)
Calcium: 7.2 mg/dL — ABNORMAL LOW (ref 8.9–10.3)
Creatinine, Ser: 0.66 mg/dL (ref 0.61–1.24)
GFR calc Af Amer: >60 mL/min (ref >60)
GLUCOSE: 94 mg/dL (ref 65–99)
POTASSIUM: 3.7 mmol/L (ref 3.5–5.1)
Sodium: 122 mmol/L — ABNORMAL LOW (ref 135–145)

## 2016-06-11 LAB — URINE CULTURE: CULTURE: NO GROWTH

## 2016-06-11 LAB — GLUCOSE, CAPILLARY
GLUCOSE-CAPILLARY: 186 mg/dL — AB (ref 65–99)
GLUCOSE-CAPILLARY: 99 mg/dL (ref 65–99)
Glucose-Capillary: 115 mg/dL — ABNORMAL HIGH (ref 65–99)
Glucose-Capillary: 113 mg/dL — ABNORMAL HIGH (ref 65–99)

## 2016-06-11 LAB — SODIUM
SODIUM: 126 mmol/L — AB (ref 135–145)
SODIUM: 136 mmol/L (ref 135–145)

## 2016-06-11 MED ORDER — SODIUM CHLORIDE 3 % IV SOLN
Freq: Once | INTRAVENOUS | Status: AC
  Administered 2016-06-11: 100 mL/h via INTRAVENOUS
  Filled 2016-06-11: qty 500

## 2016-06-11 MED ORDER — SODIUM CHLORIDE 3 % IV SOLN: INTRAVENOUS | Status: DC

## 2016-06-11 MED ORDER — DEXAMETHASONE SODIUM PHOSPHATE 4 MG/ML IJ SOLN
4.0000 mg | Freq: Two times a day (BID) | INTRAMUSCULAR | Status: DC
  Administered 2016-06-11 – 2016-06-12 (×2): 4 mg via INTRAVENOUS
  Filled 2016-06-11 (×3): qty 1

## 2016-06-11 MED ORDER — ALTEPLASE 2 MG IJ SOLR
2.0000 mg | Freq: Once | INTRAMUSCULAR | Status: AC
  Administered 2016-06-11: 2 mg
  Filled 2016-06-11: qty 2

## 2016-06-11 MED ORDER — ACETAMINOPHEN 10 MG/ML IV SOLN
1000.0000 mg | Freq: Once | INTRAVENOUS | Status: AC
  Administered 2016-06-11: 1000 mg via INTRAVENOUS
  Filled 2016-06-11: qty 100

## 2016-06-11 MED ORDER — SODIUM CHLORIDE 3 % IV SOLN
Freq: Once | INTRAVENOUS | Status: AC
  Administered 2016-06-11: 100 mL/h via INTRAVENOUS

## 2016-06-11 NOTE — Consult Note (Signed)
Subjective: I was asked to see [name] is consultation by Dr. Dyann Kief for urinary retention.  He was admitted on 11/26 for a small bowel obstruction and on 11/27 he was found to have a PVR of >9105m and a foley was placed.   He had been having a reduced stream but isn't sure when that started and he also was having nocturia but that was secondary to diabetes insipidus and resolved with treatment.   He is followed by Dr. OKarsten Rofor ED and was seen earlier this year.   His PSA then was up to 3.3 from about 1.5 2 years prior.   I have looks at several CT's from the last couple of years an he seems to have had an elevated PVR and an enlarged prostate with a middle lobe.  He has had no GU surgery and was not on therapy for the prostate.   He has a history of multiple myeloma diagnosed in 2013 and in October he was diagnosed with lung cancer with brain mets and is on decadron and received radiation therapy.  He has slowed mentation but denies other voiding complaints.   He has had MS changes since admission but his hyponatremic with a Na of 122.   His Na was 149 on 11/28.  ROS:  Review of Systems  Constitutional: Negative for chills and fever.  Gastrointestinal: Negative for abdominal pain and nausea.  Neurological: Negative for focal weakness.       He has slowed mentation.    All other systems reviewed and are negative.   Allergies  Allergen Reactions  . Tramadol Palpitations    "Made my chest feel tight"  . Amoxicillin     REACTION: rash  . Penicillins Rash    Has patient had a PCN reaction causing immediate rash, facial/tongue/throat swelling, SOB or lightheadedness with hypotension: unknown Has patient had a PCN reaction causing severe rash involving mucus membranes or skin necrosis: unknown Has patient had a PCN reaction that required hospitalization: no  Has patient had a PCN reaction occurring within the last 10 years: no  If all of the above answers are "NO", then may proceed with  Cephalosporin use.     Past Medical History:  Diagnosis Date  . Adenocarcinoma of right lung, stage 4 ([facility] 05/27/2016  . Adenomatous polyp of colon 1996  . Arthritis    Osteoarthritis right knee, spine, wrist  . Arthritis of hand, right    Right thumb (Dr. SDaylene Katayama  . Back ache    r/o Multiple Myeloma  . Benign prostatic hypertrophy    (Dr. OKarsten Roin past)  . Degenerative disc disease   . Diverticulosis   . DVT (deep venous thrombosis) ([facility] 01/2014   R calf  . Encounter for antineoplastic chemotherapy 10/20/2015  . Erosive esophagitis   . GERD (gastroesophageal reflux disease)   . Hemorrhoids   . Hiatal hernia   . History of chronic prostatitis   . History of melanoma    Back (Dr. DWilhemina Bonito; early melanoma R arm 03/2011  . Multiple myeloma ([facility] 2013   skull lytic lesions--treated with radiation 07/2014  . [place]monary embolism ([facility] 01/2014  . Radiation 07/21/14-08/05/14   left occipital parietal skull 25 gray  . SCC (squamous cell carcinoma) 08/04/15   right ear;dysplastic nevus with severe atypia-chest    Past Surgical History:  Procedure Laterality Date  . APPENDECTOMY    . BONE MARROW TRANSPLANT  03/2013   Stem Cell  . COLONOSCOPY  2009  adenomatous polyp  . ESOPHAGOGASTRODUODENOSCOPY  2009   mild inflammation at esophagogastric junction  . excision of melanoma     back ( x3)  . Great toe surgery     bilateral  . INGUINAL HERNIA REPAIR     left, x 2  . port placemet  10/2012  . SCALENE NODE BIOPSY Right 05/11/2016   Procedure: BIOPSY RIGHT SCALENE NODE;  Surgeon: Grace Isaac, MD;  Location: [facility];  Service: Thoracic;  Laterality: Right;  . TOTAL KNEE ARTHROPLASTY  06/08/2011   Procedure: TOTAL KNEE ARTHROPLASTY;  Surgeon: Ninetta Lights, MD;  Location: [facility];  Service: Orthopedics;  Laterality: Right;  osteonics  . total knee replaced  2000   Left knee    Social History   Social History  . Marital status: Married    Spouse name: N/A  . Number of  children: 2  . Years of education: N/A   Occupational History  . 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 Smoking status: Former Smoker    Packs/day: 3.00    Years: 10.00    Types: Cigarettes    Quit date: 07/11/1976  . Smokeless tobacco: Never Used     Comment: 3 PPD x 6 years  . Alcohol use 1.5 oz/week    3 Standard drinks or equivalent per week     Comment: 2 glasses of wine daily  . Drug use: No  . Sexual activity: Yes    Partners: Female   Other Topics Concern  . Not on file   Social History Narrative   Married.  2 daughters, both in [place], 4 granddaughters    Family History  Problem Relation Age of Onset  . Dementia Mother   . Stroke Father     brainstem stroke  . Hypertension Father   . Diabetes Father   . Prostate cancer Maternal Grandfather   . Depression Daughter   . Anesthesia problems Neg Hx     Anti-infectives: Anti-infectives    Start     Dose/Rate Route Frequency Ordered Stop   06/09/16 1000  clindamycin (CLEOCIN) IVPB 900 mg  Status:  Discontinued     900 mg 100 mL/hr over 30 Minutes Intravenous On call to O.R. 06/09/16 0734 06/10/16 0735   06/09/16 1000  gentamicin (GARAMYCIN) 400 mg in dextrose 5 % 100 mL IVPB  Status:  Discontinued     5 mg/kg  80.3 kg (Adjusted) 110 mL/hr over 60 Minutes Intravenous On call to O.R. 06/09/16 0734 06/10/16 0735      Current Facility-Administered Medications  Medication Dose Route Frequency Provider Last Rate Last Dose  . desmopressin (DDAVP) injection 0.52 mcg  0.52 mcg Subcutaneous Daily Dron Tanna Furry, MD   0.52 mcg at 06/10/16 1100  . dexamethasone (DECADRON) injection 4 mg  4 mg Intravenous Q6H Dron Tanna Furry, MD   4 mg at 06/11/16 [phone]  . dextrose 5 % and 0.9 % NaCl with KCl 20 mEq/L infusion   Intravenous Continuous Barton Dubois, MD 75 mL/hr at 06/10/16 1647    . HYDROmorphone (DILAUDID) injection 1 mg  1 mg Intravenous Q1H PRN Barton Dubois, MD   1 mg at 06/10/16 0736  . lidocaine-prilocaine (EMLA) cream   Topical Daily PRN Dron Tanna Furry, MD      . LORazepam (ATIVAN) injection 0.5 mg  0.5 mg Intravenous QHS PRN Dron Tanna Furry, MD   0.5 mg at 06/09/16 2317  . ondansetron (  ZOFRAN) tablet 4 mg  4 mg Oral Q6H PRN Edwin Dada, MD       Or  . ondansetron (ZOFRAN) injection 4 mg  4 mg Intravenous Q6H PRN Edwin Dada, MD   4 mg at 06/10/16 1532  . pantoprazole (PROTONIX) injection 40 mg  40 mg Intravenous Q24H Dron Tanna Furry, MD   40 mg at 06/10/16 1302  . phenol (CHLORASEPTIC) mouth spray 1 spray  1 spray Mouth/Throat PRN Barton Dubois, MD   1 spray at 06/08/16 0841  . sodium chloride flush (NS) 0.9 % injection 10-40 mL  10-40 mL Intracatheter PRN Dron Tanna Furry, MD   10 mL at 06/10/16 0446  . sodium chloride flush (NS) 0.9 % injection 10-40 mL  10-40 mL Intracatheter PRN Joseph Art, MD         Objective: Vital signs in last 24 hours: Temp:  [97.4 F (36.3 C)-98.8 F (37.1 C)] 97.4 F (36.3 C) (12/02 0520) Pulse Rate:  [51-65] 51 (12/02 0520) Resp:  [16-18] 18 (12/02 0520) BP: (106-130)/(72-82) 106/72 (12/02 0520) SpO2:  [96 %-98 %] 96 % (12/02 0520)  Intake/Output from previous day: 12/01 0701 - 12/02 0700 In: 1231.3 [P.O.:240; I.V.:991.3] Out: 2420 [Urine:2420] Intake/Output this shift: Total I/O In: -  Out: 1000 [Urine:1000]   Physical Exam  Constitutional: He is oriented to person, place, and time and well-developed, well-nourished, and in no distress.  HENT:  Head: Normocephalic and atraumatic.  Neck: Normal range of motion. Neck supple.  Cardiovascular: Normal rate, regular rhythm and normal heart sounds.   Pulmonary/Chest: Effort normal and breath sounds normal. No respiratory distress.  Abdominal: Soft. Bowel sounds are normal. He exhibits no distension and no mass. There is no tenderness.  Genitourinary: Penis normal.  Genitourinary Comments: Foley  indwelling draining clear urine.   Anus and perineum without abnormalities.  NST without mass.  Prostate is 3+ enlarged without nodules.   Musculoskeletal: Normal range of motion. He exhibits no edema or deformity.  Lymphadenopathy:    He has no cervical adenopathy.       Right: No inguinal and no supraclavicular adenopathy present.       Left: No inguinal and no supraclavicular adenopathy present.  Neurological: He is oriented to person, place, and time.  He has somewhat slowed mentation but has no focal motor or sensory defects.   Skin: Skin is warm and dry.  Psychiatric: Mood normal.  Affect is flat.   Vitals reviewed.   Lab Results:   Recent Labs  06/10/16 0601 06/11/16 0500  WBC 4.9 5.4  HGB 9.5* 9.3*  HCT 26.9* 25.3*  PLT 72* 72*   BMET  Recent Labs  06/10/16 1202 06/11/16 0500  NA 125* 122*  K 3.4* 3.7  CL 94* 94*  CO2 24 21*  GLUCOSE 125* 94  BUN 11 10  CREATININE 0.67 0.66  CALCIUM 7.5* 7.2*   PT/INR No results for input(s): LABPROT, INR in the last 72 hours. ABG No results for input(s): PHART, HCO3 in the last 72 hours.  Invalid input(s): PCO2, PO2  Studies/Results: Ct Entero Abd/pelvis W Contast  Result Date: 06/10/2016 CLINICAL DATA:  History of small-bowel obstruction. EXAM: CT ABDOMEN AND PELVIS WITH CONTRAST (ENTEROGRAPHY) TECHNIQUE: Multidetector CT of the abdomen and pelvis during bolus administration of intravenous contrast. Negative oral contrast was given. CONTRAST:  169m ISOVUE-300 IOPAMIDOL (ISOVUE-300) INJECTION 61% COMPARISON:  CT scan 06/06/2016 FINDINGS: Lower chest: Small right pleural effusion and minimal bibasilar atelectasis. No pericardial effusion.  The distal esophagus is grossly normal. Hepatobiliary: No focal hepatic lesions or intrahepatic biliary dilatation. There is a fundal nodule associate with the gallbladder. This was also present on a prior CT scan from 2014. This most likely a small fundal adenomyoma. High attenuation  material layering in the gallbladder could be sludge or contrast from vicarious excretion. Pancreas: No mass, inflammation or ductal dilatation. Spleen: Normal size.  No focal lesions. Adrenals/Urinary Tract: The adrenal glands are unremarkable and stable. The kidneys are unremarkable. Stable scarring changes involving the left kidney. No hydronephrosis. Stomach/Bowel: The stomach and duodenum are unremarkable. No findings for small bowel obstruction or free air. In the right lower quadrant/ right pelvis there is an area of focal bowel wall thickening and slight constriction and enhancement(axial images 68 an 69 and coronal image 47). This is the same area identified on the prior CT scan. It could be a focal area of enteritis or strictured narrowing related to prior inflammatory process. No current small bowel obstruction is identified however. Vascular/Lymphatic: No pathologically enlarged lymph nodes. No evidence of abdominal aortic aneurysm Reproductive: Mildly enlarged prostate gland. Other: No pelvic mass or adenopathy. No free pelvic fluid collections. The bladder is decompressed by Foley catheter. No inguinal mass or adenopathy. Musculoskeletal: No significant bony findings. Diffuse osteoporosis and degenerative changes again noted. IMPRESSION: Focal area of narrowing, wall thickening and enhancement in the distal ileum in the right pelvis correlating with the prior CT scan. This could be a small focus of active inflammation or area of constriction from prior inflammatory process. No current small bowel obstruction. Electronically Signed   By: Marijo Sanes M.D.   On: 06/10/2016 17:00   Dg Abd 2 Views  Result Date: 06/10/2016 CLINICAL DATA:  Small-bowel obstruction.  Follow-up exam . EXAM: ABDOMEN - 2 VIEW COMPARISON:  06/09/2016. FINDINGS: Soft tissue structures are unremarkable. Several loops of distended small bowel are noted on today's exam. Colonic gas pattern is normal. These findings are consistent  with partial small bowel obstruction. No free air. PowerPort catheter noted with tip projected over right atrium. Diffuse degenerative changes thoracolumbar spine. Multiple stable mild compression fractures. Degenerative changes both hips. IMPRESSION: Several slightly distended loops small bowel noted on today's exam. Colonic gas pattern is normal. Findings suggest partial small bowel obstruction. No free air. Electronically Signed   By: Marcello Moores  Register   On: 06/10/2016 08:37   I have reviewed the hospital notes and our prior office notes and discussed the case with Dr. Dyann Kief.  I have reviewed his CT imaging and reports and his labs.    Assessment: 1. Urinary retention with BPH.  The retention is acute but he may have had a chronically elevated PVR.   2. MS changes with hyponatremia but no focal deficits. 3. Lung CA with mets to the brain.   Rec: 1.  Consider tamsulosin 0.'4mg'$  daily but monitor for orthostasis. 2.  He will need f/u later this week with Dr. Karsten Ro for a voiding trial.  3.  With the high PVR that appears chronic, he may need prolonged catheter drainage and eventual urodynamics with possible TURP if he is medically able.   CC: Dr. Barton Dubois and Dr. Kathie Rhodes.      [name] J 06/11/2016 [id number]

## 2016-06-11 NOTE — Evaluation (Signed)
Physical Therapy Evaluation Patient Details Name: [name] MRN: [id number] DOB: [birth year] Today's Date: 06/11/2016   History of Present Illness  Pt admitted with SBO and underwent exp lap 06/09/16.  Pt with hx of multiple myeloma and lung CA  Clinical Impression  Pt admitted as above and presenting with functional mobility limitations 2* generalized weakness, ambulatory balance deficits, poor endurance and safety awareness.  Pt should progress to dc home with family assist.      Follow Up Recommendations No PT follow up    Equipment Recommendations  None recommended by PT    Recommendations for Other Services       Precautions / Restrictions Precautions Precautions: Fall Restrictions Weight Bearing Restrictions: No      Mobility  Bed Mobility Overal bed mobility: Needs Assistance Bed Mobility: Supine to Sit     Supine to sit: Mod assist     General bed mobility comments: cues for log roll and sequencing.   Transfers Overall transfer level: Needs assistance Equipment used: Rolling walker (2 wheeled) Transfers: Sit to/from Stand Sit to Stand: Min assist;Mod assist;+2 physical assistance;+2 safety/equipment         General transfer comment: cues for transition position and use of UEs to self assist.  Physical assist to bring wt up and fwd and to balance in initial standing  Ambulation/Gait Ambulation/Gait assistance: Min assist;Mod assist;+2 safety/equipment Ambulation Distance (Feet): 112 Feet Assistive device: Rolling walker (2 wheeled) Gait Pattern/deviations: Step-through pattern;Decreased step length - right;Decreased step length - left;Shuffle;Trunk flexed Gait velocity: decr Gait velocity interpretation: Below normal speed for age/gender General Gait Details: cues for posture, sequence, position from RW and safety awareness.  Assist for balance, support and to manage RW  Stairs            Wheelchair Mobility    Modified Rankin (Stroke  Patients Only)       Balance Overall balance assessment: Needs assistance Sitting-balance support: No upper extremity supported;Feet supported Sitting balance-Leahy Scale: Fair     Standing balance support: Bilateral upper extremity supported Standing balance-Leahy Scale: Poor                               Pertinent Vitals/Pain Pain Assessment: Faces Faces Pain Scale: Hurts little more Pain Location: headache Pain Descriptors / Indicators: Aching Pain Intervention(s): Limited activity within patient's tolerance;Monitored during session;Patient requesting pain meds-RN notified    Home Living Family/patient expects to be discharged to:: Private residence Living Arrangements: Spouse/significant other Available Help at Discharge: Family Type of Home: House Home Access: Stairs to enter   Technical brewer of Steps: 2 Home Layout: One level        Prior Function Level of Independence: Independent         Comments: Pt walked an hour a day prior to hospital admit     Hand Dominance        Extremity/Trunk Assessment   Upper Extremity Assessment: Generalized weakness           Lower Extremity Assessment: Generalized weakness         Communication   Communication: No difficulties  Cognition Arousal/Alertness: Awake/alert Behavior During Therapy: WFL for tasks assessed/performed Overall Cognitive Status: Impaired/Different from baseline Area of Impairment: Orientation;Memory;Safety/judgement;Problem solving Orientation Level: Time       Safety/Judgement: Decreased awareness of safety   Problem Solving: Slow processing General Comments: Physician in room and reports cognition possibley due to low Na+  General Comments      Exercises     Assessment/Plan    PT Assessment Patient needs continued PT services  PT Problem List Decreased strength;Decreased activity tolerance;Decreased balance;Decreased mobility;Decreased  cognition;Decreased knowledge of use of DME;Pain;Decreased safety awareness          PT Treatment Interventions DME instruction;Gait training;Stair training;Functional mobility training;Therapeutic activities;Therapeutic exercise;Patient/family education    PT Goals (Current goals can be found in the Care Plan section)  Acute Rehab PT Goals Patient Stated Goal: Home PT Goal Formulation: With patient Time For Goal Achievement: 06/25/16 Potential to Achieve Goals: Good    Frequency Min 3X/week   Barriers to discharge        Co-evaluation               End of Session   Activity Tolerance: Patient limited by fatigue Patient left: in chair;with call bell/phone within reach;with nursing/sitter in room Nurse Communication: Mobility status         Time: 1115-1150 PT Time Calculation (min) (ACUTE ONLY): 35 min   Charges:   PT Evaluation $PT Eval Low Complexity: 1 Procedure PT Treatments $Gait Training: 8-22 mins   PT G Codes:        Terrionna Bridwell 2016/06/16, 12:47 PM

## 2016-06-11 NOTE — Progress Notes (Signed)
General Surgery [facility], [name]  Assessment & Plan:  Small bowel obstruction  Clinically resolving - no nausea or emesis overnight - has not tried clear liquid diet yet  CT enterography with short segment narrowing likely related to inflammation  Would begin clear liquids today and advance if tolerated  Will follow with you        Earnstine Regal, MD, [facility]       Office: [phone]    Subjective: Patient in bed, no complaints.  Denies nausea or emesis.  States has not tried clear liquid diet yet.  Objective: Vital signs in last 24 hours: Temp:  [97.4 F (36.3 C)-98.8 F (37.1 C)] 97.4 F (36.3 C) (12/02 0520) Pulse Rate:  [51-65] 51 (12/02 0520) Resp:  [16-18] 18 (12/02 0520) BP: (106-130)/(72-82) 106/72 (12/02 0520) SpO2:  [96 %-98 %] 96 % (12/02 0520) Last BM Date: 06/06/16  Intake/Output from previous day: 12/01 0701 - 12/02 0700 In: 1231.3 [P.O.:240; I.V.:991.3] Out: 2420 [Urine:2420] Intake/Output this shift: Total I/O In: -  Out: 1000 [Urine:1000]  Physical Exam: HEENT - sclerae clear, mucous membranes moist Abdomen - soft without distension; BS present; non-tender Ext - no edema, non-tender  Lab Results:   Recent Labs  06/10/16 0601 06/11/16 0500  WBC 4.9 5.4  HGB 9.5* 9.3*  HCT 26.9* 25.3*  PLT 72* 72*   BMET  Recent Labs  06/10/16 1202 06/11/16 0500  NA 125* 122*  K 3.4* 3.7  CL 94* 94*  CO2 24 21*  GLUCOSE 125* 94  BUN 11 10  CREATININE 0.67 0.66  CALCIUM 7.5* 7.2*   PT/INR No results for input(s): LABPROT, INR in the last 72 hours. Comprehensive Metabolic Panel:    Component Value Date/Time   NA 122 (L) 06/11/2016 0500   NA 125 (L) 06/10/2016 1202   NA 137 05/25/2016 1457   NA 144 03/15/2016 0912   K 3.7 06/11/2016 0500   K 3.4 (L) 06/10/2016 1202   K 3.6 05/25/2016 1457   K 3.7 03/15/2016 0912   CL 94 (L) 06/11/2016 0500   CL 94 (L) 06/10/2016 1202   CL 109 (H)  12/31/2012 0759   CL 107 12/24/2012 0837   CO2 21 (L) 06/11/2016 0500   CO2 24 06/10/2016 1202   CO2 25 05/25/2016 1457   CO2 24 03/15/2016 0912   BUN 10 06/11/2016 0500   BUN 11 06/10/2016 1202   BUN 10.7 05/25/2016 1457   BUN 10.8 03/15/2016 0912   CREATININE 0.66 06/11/2016 0500   CREATININE 0.67 06/10/2016 1202   CREATININE 0.9 05/25/2016 1457   CREATININE 1.11 04/25/2016 1514   CREATININE 0.94 04/21/2016 1107   CREATININE 0.9 03/15/2016 0912   GLUCOSE 94 06/11/2016 0500   GLUCOSE 125 (H) 06/10/2016 1202   GLUCOSE 118 05/25/2016 1457   GLUCOSE 98 03/15/2016 0912   GLUCOSE 116 (H) 12/31/2012 0759   GLUCOSE 136 (H) 12/24/2012 0837   CALCIUM 7.2 (L) 06/11/2016 0500   CALCIUM 7.5 (L) 06/10/2016 1202   CALCIUM 9.6 05/25/2016 1457   CALCIUM 9.3 03/15/2016 0912   AST 22 06/05/2016 2353   AST 23 05/09/2016 0828   AST 19 03/15/2016 0912   AST 27 02/11/2016 0849   ALT 34 06/05/2016 2353   ALT 37 05/09/2016 0828   ALT 18 03/15/2016 0912   ALT 36 02/11/2016 0849   ALKPHOS 58 06/05/2016 2353   ALKPHOS 48 05/09/2016 0828  ALKPHOS 51 03/15/2016 0912   ALKPHOS 63 02/11/2016 0849   BILITOT 0.7 06/05/2016 2353   BILITOT 1.1 05/09/2016 0828   BILITOT 0.97 03/15/2016 0912   BILITOT 0.60 02/11/2016 0849   PROT 7.0 06/05/2016 2353   PROT 6.5 05/09/2016 0828   PROT 6.6 03/15/2016 0912   PROT 6.8 02/11/2016 0849   ALBUMIN 4.1 06/05/2016 2353   ALBUMIN 3.6 05/09/2016 0828   ALBUMIN 3.2 (L) 03/15/2016 0912   ALBUMIN 3.4 (L) 02/11/2016 0849    Studies/Results: Ct Entero Abd/pelvis W Contast  Result Date: 06/10/2016 CLINICAL DATA:  History of small-bowel obstruction. EXAM: CT ABDOMEN AND PELVIS WITH CONTRAST (ENTEROGRAPHY) TECHNIQUE: Multidetector CT of the abdomen and pelvis during bolus administration of intravenous contrast. Negative oral contrast was given. CONTRAST:  168m ISOVUE-300 IOPAMIDOL (ISOVUE-300) INJECTION 61% COMPARISON:  CT scan 06/06/2016 FINDINGS: Lower chest: Small  right pleural effusion and minimal bibasilar atelectasis. No pericardial effusion. The distal esophagus is grossly normal. Hepatobiliary: No focal hepatic lesions or intrahepatic biliary dilatation. There is a fundal nodule associate with the gallbladder. This was also present on a prior CT scan from 2014. This most likely a small fundal adenomyoma. High attenuation material layering in the gallbladder could be sludge or contrast from vicarious excretion. Pancreas: No mass, inflammation or ductal dilatation. Spleen: Normal size.  No focal lesions. Adrenals/Urinary Tract: The adrenal glands are unremarkable and stable. The kidneys are unremarkable. Stable scarring changes involving the left kidney. No hydronephrosis. Stomach/Bowel: The stomach and duodenum are unremarkable. No findings for small bowel obstruction or free air. In the right lower quadrant/ right pelvis there is an area of focal bowel wall thickening and slight constriction and enhancement(axial images 68 an 69 and coronal image 47). This is the same area identified on the prior CT scan. It could be a focal area of enteritis or strictured narrowing related to prior inflammatory process. No current small bowel obstruction is identified however. Vascular/Lymphatic: No pathologically enlarged lymph nodes. No evidence of abdominal aortic aneurysm Reproductive: Mildly enlarged prostate gland. Other: No pelvic mass or adenopathy. No free pelvic fluid collections. The bladder is decompressed by Foley catheter. No inguinal mass or adenopathy. Musculoskeletal: No significant bony findings. Diffuse osteoporosis and degenerative changes again noted. IMPRESSION: Focal area of narrowing, wall thickening and enhancement in the distal ileum in the right pelvis correlating with the prior CT scan. This could be a small focus of active inflammation or area of constriction from prior inflammatory process. No current small bowel obstruction. Electronically Signed   By: PMarijo SanesM.D.   On: 06/10/2016 17:00   Dg Abd 2 Views  Result Date: 06/10/2016 CLINICAL DATA:  Small-bowel obstruction.  Follow-up exam . EXAM: ABDOMEN - 2 VIEW COMPARISON:  06/09/2016. FINDINGS: Soft tissue structures are unremarkable. Several loops of distended small bowel are noted on today's exam. Colonic gas pattern is normal. These findings are consistent with partial small bowel obstruction. No free air. PowerPort catheter noted with tip projected over right atrium. Diffuse degenerative changes thoracolumbar spine. Multiple stable mild compression fractures. Degenerative changes both hips. IMPRESSION: Several slightly distended loops small bowel noted on today's exam. Colonic gas pattern is normal. Findings suggest partial small bowel obstruction. No free air. Electronically Signed   By: TMarcello Moores Register   On: 06/10/2016 08:37      GSchenectadyM 06/11/2016  Patient ID: [name] male   DOB: 606/20/1942 75y.o.   MRN: [id number]

## 2016-06-11 NOTE — Progress Notes (Signed)
PROGRESS NOTE    JOSON SAPP  CHE:[id number] DOB: 03-Apr-1941 DOA: 06/05/2016 PCP: [name], MD   Brief Narrative: 75 y.o. male with a past medical history significant for metastatic lung cancer, PE on Xarelto who presents with intermittent abdominal pain and vomiting.CT of the abdomen showed small bowel obstruction with transition point near an area of "inflammatory process".Placement of NG tube failed, and TRH were asked to evaluate for admission for SBO.  (The patient was first diagnosed with cancer in 2013, with multiple myeloma. He underwent 2 rounds of Velcade, Revlimid, carfilzomib then had a BMT at [facility].  Then in 2017 he was diagnosed with brain metastases, and found to have a lung primary.  He has undergone skull radiation and is about to start Golovin this Tuesday.)  Assessment & Plan: # Small bowel obstruction: -will advance diet to CLD -Repeat abdominal x-ray of abdomen demonstrated resolution of obstruction; holding surgery now. -Continue IV dilaudid if severe pain present only and IV zofran -continue supportive care.  -Follow-up with Gen. surgery and GI for further plan and recommendation. -improvement in x-ray and apparently resolution of obstruction.  -NGT removed and well tolerated -CT enterography with confirmation on resolution of SBO; demonstrating some area of inflammation/narrowing around ileum; will follow GI rec's.  # Hypokalemia/hypomagnesemia:   -most likely associated with NGT -will monitor and replete as needed   #hypernatremia: Patient reported that he was diagnosed with diabetes insipidus and takes DDAVP at night.  -resolved and in fact now with mild hyponatremia -will IVF's adjusted and given confusion, HA and balance off with give saline 3% for couple boluses -will follow clinical response and sodium level   # Acute kidney injury likely due to dehydration and urinary retention:  --Serum creatinine level back to normal range after fluids  resuscitation. -Continue IV fluid and follow renal function trend  #  History of pulmonary embolism:  -will hold heparin drip for now due to drift on Hgb and platelets -will resume oral anticoagulation when tolerating PO again and no surgery or intervention is plan ahead.  #thombocytopenia -no signs of bleeding -stable in 72,000 range -will continue holding heparin drip  #  Brain metastases (HCC) -Adenocarcinoma of right lung, stage 4 ([place]): on decadron IV and PPI.  -will continue   # Acute urinary retention -will keep foley in place If he tolerates diet will start flomax as recommended by urology -will check UA and urine cx  # Metabolic encephalopathy -will follow UA and urine cx -replete electrolytes, especially potassium that is low now -minimize narcotics and benzos -follow clinical response -possible hospital acquired delirium; discussed with family about constant reorientation     DVT prophylaxis:SCD Code Status:full Family Communication:no Disposition Plan:to be determine. Patient feeling better, no CP, no SOB and denies abd pain. Per x-ray and CT enterography no SBO seen. Patient with focal narrowing around ileum. Will advance diet and work up his confusion.  Consultants:   Surgery team  GI  Urology   Procedures:  see below for x-ray reports   Antimicrobials:   none  Subjective:  No fever, no CP, no SOB. Patient continue to be confuse and is complaining of HA. Family endorses that his balance is off.  Objective: Vitals:   06/10/16 1359 06/10/16 1638 06/10/16 2204 06/11/16 0520  BP: 123/73 116/82 130/79 106/72  Pulse: 65 (!) 57 (!) 56 (!) 51  Resp: '18 16 16 18  '$ Temp: 98.8 F (37.1 C) 97.8 F (36.6 C) 98.6 F (37 C) 97.4  F (36.3 C)  TempSrc: Oral Oral Oral Oral  SpO2: 98% 96% 98% 96%  Weight:      Height:        Intake/Output Summary (Last 24 hours) at 06/11/16 1241 Last data filed at 06/11/16 1100  Gross per 24 hour  Intake           1411.25 ml  Output             2920 ml  Net         -1508.75 ml   Filed Weights   06/06/16 0900  Weight: 94.6 kg (208 lb 8.9 oz)    Examination:  General exam: Pleasant male sitting on bed, slightly confused still (but better today overall). family reports increase somnolence, off balance and patient endorses HA. No nausea, vomtiing or abdominal pain currently. Foley in place. Passing gas, but no BM's yet Respiratory system: Clear bilateral, no wheezing or crackles Cardiovascular system: Regular rate rhythm, S1 and S2 normal. No pedal edema Gastrointestinal system: Abdomen soft, ND, no tenderness. Bowel sound positive. Central nervous system: Alert awake, oriented. No focal neurological deficit Extremities: Symmetric 5 x 5 power. Skin: No rashes, lesions or ulcers Psychiatry: Judgement and insight appear normal. Mood & affect appropriate.    Data Reviewed: I have personally reviewed following labs and imaging studies  CBC:  Recent Labs Lab 06/05/16 2353 06/07/16 0418 06/08/16 0627 06/09/16 0610 06/10/16 0601 06/11/16 0500  WBC 9.6 5.9 7.4 6.4 4.9 5.4  NEUTROABS 8.5*  --   --   --   --   --   HGB 14.7 12.9* 11.5* 10.5* 9.5* 9.3*  HCT 41.1 38.5* 34.8* 31.2* 26.9* 25.3*  MCV 90.9 96.3 95.3 96.0 92.4 87.8  PLT 181 149* 120* 94* 72* 72*   Basic Metabolic Panel:  Recent Labs Lab 06/06/16 0833 06/07/16 0418  06/07/16 1800 06/08/16 0627 06/09/16 0610 06/10/16 1202 06/11/16 0500  NA 136 154*  < > 149* 143 136 125* 122*  K 3.1* 4.7  < > 4.4 3.9 3.6 3.4* 3.7  CL 102 124*  < > 120* 113* 106 94* 94*  CO2 24 25  < > '24 23 23 24 '$ 21*  GLUCOSE 114* 138*  < > 158* 151* 141* 125* 94  BUN 17 14  < > '18 19 15 11 10  '$ CREATININE 1.58* 1.18  < > 1.28* 1.16 0.92 0.67 0.66  CALCIUM 9.3 9.1  < > 8.8* 8.2* 7.5* 7.5* 7.2*  MG 1.6* 2.4  --   --   --   --   --   --   < > = values in this interval not displayed. GFR: Estimated Creatinine Clearance: 90.6 mL/min (by C-G formula based on  SCr of 0.66 mg/dL).   Liver Function Tests:  Recent Labs Lab 06/05/16 2353  AST 22  ALT 34  ALKPHOS 58  BILITOT 0.7  PROT 7.0  ALBUMIN 4.1    Recent Labs Lab 06/05/16 2353  LIPASE 42   Coagulation Profile:  Recent Labs Lab 06/07/16 1100  INR 1.13   Cardiac Enzymes:  Recent Labs Lab 06/05/16 2353  TROPONINI <0.03   CBG:  Recent Labs Lab 06/10/16 1217 06/10/16 1657 06/10/16 2159 06/11/16 0745 06/11/16 1157  GLUCAP 133* 130* 140* 115* 186*    Recent Results (from the past 240 hour(s))  C difficile quick scan w PCR reflex     Status: None   Collection Time: 06/06/16  4:07 AM  Result Value Ref Range Status  C Diff antigen NEGATIVE NEGATIVE Final   C Diff toxin NEGATIVE NEGATIVE Final   C Diff interpretation No C. difficile detected.  Final  Gastrointestinal Panel by PCR , Stool     Status: None   Collection Time: 06/06/16  4:07 AM  Result Value Ref Range Status   Campylobacter species NOT DETECTED NOT DETECTED Final   Plesimonas shigelloides NOT DETECTED NOT DETECTED Final   Salmonella species NOT DETECTED NOT DETECTED Final   Yersinia enterocolitica NOT DETECTED NOT DETECTED Final   Vibrio species NOT DETECTED NOT DETECTED Final   Vibrio cholerae NOT DETECTED NOT DETECTED Final   Enteroaggregative E coli (EAEC) NOT DETECTED NOT DETECTED Final   Enteropathogenic E coli (EPEC) NOT DETECTED NOT DETECTED Final   Enterotoxigenic E coli (ETEC) NOT DETECTED NOT DETECTED Final   Shiga like toxin producing E coli (STEC) NOT DETECTED NOT DETECTED Final   Shigella/Enteroinvasive E coli (EIEC) NOT DETECTED NOT DETECTED Final   Cryptosporidium NOT DETECTED NOT DETECTED Final   Cyclospora cayetanensis NOT DETECTED NOT DETECTED Final   Entamoeba histolytica NOT DETECTED NOT DETECTED Final   Giardia lamblia NOT DETECTED NOT DETECTED Final   Adenovirus F40/41 NOT DETECTED NOT DETECTED Final   Astrovirus NOT DETECTED NOT DETECTED Final   Norovirus GI/GII NOT  DETECTED NOT DETECTED Final   Rotavirus A NOT DETECTED NOT DETECTED Final   Sapovirus (I, II, IV, and V) NOT DETECTED NOT DETECTED Final  Surgical PCR screen     Status: None   Collection Time: 06/09/16  7:54 AM  Result Value Ref Range Status   MRSA, PCR NEGATIVE NEGATIVE Final   Staphylococcus aureus NEGATIVE NEGATIVE Final    Comment:        The Xpert SA Assay (FDA approved for NASAL specimens in patients over 58 years of age), is one component of a comprehensive surveillance program.  Test performance has been validated by [facility] for patients greater than or equal to 4 year old. It is not intended to diagnose infection nor to guide or monitor treatment.   Culture, Urine     Status: None   Collection Time: 06/10/16 12:02 PM  Result Value Ref Range Status   Specimen Description URINE, CATHETERIZED  Final   Special Requests NONE  Final   Culture NO GROWTH Performed at [facility]   Final   Report Status 06/11/2016 FINAL  Final     Radiology Studies: Ct Entero Abd/pelvis W Contast  Result Date: 06/10/2016 CLINICAL DATA:  History of small-bowel obstruction. EXAM: CT ABDOMEN AND PELVIS WITH CONTRAST (ENTEROGRAPHY) TECHNIQUE: Multidetector CT of the abdomen and pelvis during bolus administration of intravenous contrast. Negative oral contrast was given. CONTRAST:  157m ISOVUE-300 IOPAMIDOL (ISOVUE-300) INJECTION 61% COMPARISON:  CT scan 06/06/2016 FINDINGS: Lower chest: Small right pleural effusion and minimal bibasilar atelectasis. No pericardial effusion. The distal esophagus is grossly normal. Hepatobiliary: No focal hepatic lesions or intrahepatic biliary dilatation. There is a fundal nodule associate with the gallbladder. This was also present on a prior CT scan from 2014. This most likely a small fundal adenomyoma. High attenuation material layering in the gallbladder could be sludge or contrast from vicarious excretion. Pancreas: No mass, inflammation or ductal  dilatation. Spleen: Normal size.  No focal lesions. Adrenals/Urinary Tract: The adrenal glands are unremarkable and stable. The kidneys are unremarkable. Stable scarring changes involving the left kidney. No hydronephrosis. Stomach/Bowel: The stomach and duodenum are unremarkable. No findings for small bowel obstruction or free air. In the  right lower quadrant/ right pelvis there is an area of focal bowel wall thickening and slight constriction and enhancement(axial images 68 an 69 and coronal image 47). This is the same area identified on the prior CT scan. It could be a focal area of enteritis or strictured narrowing related to prior inflammatory process. No current small bowel obstruction is identified however. Vascular/Lymphatic: No pathologically enlarged lymph nodes. No evidence of abdominal aortic aneurysm Reproductive: Mildly enlarged prostate gland. Other: No pelvic mass or adenopathy. No free pelvic fluid collections. The bladder is decompressed by Foley catheter. No inguinal mass or adenopathy. Musculoskeletal: No significant bony findings. Diffuse osteoporosis and degenerative changes again noted. IMPRESSION: Focal area of narrowing, wall thickening and enhancement in the distal ileum in the right pelvis correlating with the prior CT scan. This could be a small focus of active inflammation or area of constriction from prior inflammatory process. No current small bowel obstruction. Electronically Signed   By: Marijo Sanes M.D.   On: 06/10/2016 17:00   Dg Abd 2 Views  Result Date: 06/10/2016 CLINICAL DATA:  Small-bowel obstruction.  Follow-up exam . EXAM: ABDOMEN - 2 VIEW COMPARISON:  06/09/2016. FINDINGS: Soft tissue structures are unremarkable. Several loops of distended small bowel are noted on today's exam. Colonic gas pattern is normal. These findings are consistent with partial small bowel obstruction. No free air. PowerPort catheter noted with tip projected over right atrium. Diffuse  degenerative changes thoracolumbar spine. Multiple stable mild compression fractures. Degenerative changes both hips. IMPRESSION: Several slightly distended loops small bowel noted on today's exam. Colonic gas pattern is normal. Findings suggest partial small bowel obstruction. No free air. Electronically Signed   By: [place]   On: 06/10/2016 08:37    Scheduled Meds: . acetaminophen  1,000 mg Intravenous Once  . dexamethasone  4 mg Intravenous Q6H  . pantoprazole (PROTONIX) IV  40 mg Intravenous Q24H   Continuous Infusions:    LOS: 5 days   Barton Dubois, MD [facility] Pager [phone]  If 7PM-7AM, please contact night-coverage [email or web address] Password [facility] 06/11/2016, 12:41 PM

## 2016-06-11 NOTE — Progress Notes (Signed)
[facility] Progress Note  Chief Complaint:   Bowel obstruction  Subjective: mild nausea after jello and liquids. Has a headache  Objective:  Vital signs in last 24 hours: Temp:  [97.4 F (36.3 C)-98.8 F (37.1 C)] 97.4 F (36.3 C) (12/02 0520) Pulse Rate:  [51-65] 51 (12/02 0520) Resp:  [16-18] 18 (12/02 0520) BP: (106-130)/(72-82) 106/72 (12/02 0520) SpO2:  [96 %-98 %] 96 % (12/02 0520) Last BM Date: 06/06/16 General:   Alert, well-developed, white male  in NAD. Wife and daughter in room.  EENT:  Normal hearing, non icteric sclera, conjunctive pink.  Heart:  Regular rate and rhythm, no extremity edema Abdomen:  Soft, nondistended, nontender.  Normal bowel soundsy.    Neurologic:  Alert and  oriented x4;  grossly normal neurologically. Psych:  Alert and cooperative. Normal mood and affect.  Intake/Output from previous day: 12/01 0701 - 12/02 0700 In: 1231.3 [P.O.:240; I.V.:991.3] Out: 2420 [Urine:2420] Intake/Output this shift: Total I/O In: -  Out: 1000 [Urine:1000]  Lab Results:  Recent Labs  06/09/16 0610 06/10/16 0601 06/11/16 0500  WBC 6.4 4.9 5.4  HGB 10.5* 9.5* 9.3*  HCT 31.2* 26.9* 25.3*  PLT 94* 72* 72*   BMET  Recent Labs  06/09/16 0610 06/10/16 1202 06/11/16 0500  NA 136 125* 122*  K 3.6 3.4* 3.7  CL 106 94* 94*  CO2 23 24 21*  GLUCOSE 141* 125* 94  BUN '15 11 10  '$ CREATININE 0.92 0.67 0.66  CALCIUM 7.5* 7.5* 7.2*    Ct Entero Abd/pelvis W Contast  Result Date: 06/10/2016 CLINICAL DATA:  History of small-bowel obstruction. EXAM: CT ABDOMEN AND PELVIS WITH CONTRAST (ENTEROGRAPHY) TECHNIQUE: Multidetector CT of the abdomen and pelvis during bolus administration of intravenous contrast. Negative oral contrast was given. CONTRAST:  137m ISOVUE-300 IOPAMIDOL (ISOVUE-300) INJECTION 61% COMPARISON:  CT scan 06/06/2016 FINDINGS: Lower chest: Small right pleural effusion and minimal bibasilar atelectasis. No pericardial effusion.  The distal esophagus is grossly normal. Hepatobiliary: No focal hepatic lesions or intrahepatic biliary dilatation. There is a fundal nodule associate with the gallbladder. This was also present on a prior CT scan from 2014. This most likely a small fundal adenomyoma. High attenuation material layering in the gallbladder could be sludge or contrast from vicarious excretion. Pancreas: No mass, inflammation or ductal dilatation. Spleen: Normal size.  No focal lesions. Adrenals/Urinary Tract: The adrenal glands are unremarkable and stable. The kidneys are unremarkable. Stable scarring changes involving the left kidney. No hydronephrosis. Stomach/Bowel: The stomach and duodenum are unremarkable. No findings for small bowel obstruction or free air. In the right lower quadrant/ right pelvis there is an area of focal bowel wall thickening and slight constriction and enhancement(axial images 68 an 69 and coronal image 47). This is the same area identified on the prior CT scan. It could be a focal area of enteritis or strictured narrowing related to prior inflammatory process. No current small bowel obstruction is identified however. Vascular/Lymphatic: No pathologically enlarged lymph nodes. No evidence of abdominal aortic aneurysm Reproductive: Mildly enlarged prostate gland. Other: No pelvic mass or adenopathy. No free pelvic fluid collections. The bladder is decompressed by Foley catheter. No inguinal mass or adenopathy. Musculoskeletal: No significant bony findings. Diffuse osteoporosis and degenerative changes again noted. IMPRESSION: Focal area of narrowing, wall thickening and enhancement in the distal ileum in the right pelvis correlating with the prior CT scan. This could be a small focus of active inflammation or area of constriction from prior inflammatory process.  No current small bowel obstruction. Electronically Signed   By: Marijo Sanes M.D.   On: 06/10/2016 17:00    Assessment / Plan:  1. SBO,  resolving. First time with clear liquids an hour ago. Mild nausea but otherwise okay  CTenterography again shows focal area of narrowing  / wall thickening in distal ileum. Clinically, do okay, some flatus.   2. Stage IV non small cell lung cancer with brain mets  3. Thrombocytopenia, new. Platelets stabilized at 70. Was on IV heparin but apparently not felt to represent HIT. No longer on heparin  4. Hyponatremia.  Intermittent confusion yesterday afternoon, repeat labs at noon revealed Na+ drop from 143 to 125. Repeat Na+ today only 122. Primary team has ordered hypertonic saline infusion.   5. Urinary retention. Urology saw in consult yesterday. He has BPH, will need outpatient workup     LOS: 5 days   Tye Savoy  06/11/2016, 12:18 PM  Pager number [phone]     Attending physician's note   I have taken an interval history, reviewed the chart and examined the patient. I agree with the Advanced Practitioner's note, impression and recommendations. Resolving SBO. CTE reviewed with focal area of ileal narrowing without bowel obstruction. Tolerated clears. Trial of full liquids later today.   Lucio Edward, MD [facility] [phone] Mon-Fri 8a-5p [phone] after 5p, weekends, holidays

## 2016-06-12 LAB — BASIC METABOLIC PANEL
ANION GAP: 7 (ref 5–15)
BUN: 13 mg/dL (ref 6–20)
CALCIUM: 9.4 mg/dL (ref 8.9–10.3)
CO2: 24 mmol/L (ref 22–32)
Chloride: 109 mmol/L (ref 101–111)
Creatinine, Ser: 0.94 mg/dL (ref 0.61–1.24)
Glucose, Bld: 114 mg/dL — ABNORMAL HIGH (ref 65–99)
Potassium: 3.4 mmol/L — ABNORMAL LOW (ref 3.5–5.1)
Sodium: 140 mmol/L (ref 135–145)

## 2016-06-12 LAB — GLUCOSE, CAPILLARY
GLUCOSE-CAPILLARY: 140 mg/dL — AB (ref 65–99)
GLUCOSE-CAPILLARY: 129 mg/dL — AB (ref 65–99)
Glucose-Capillary: 109 mg/dL — ABNORMAL HIGH (ref 65–99)
Glucose-Capillary: 174 mg/dL — ABNORMAL HIGH (ref 65–99)

## 2016-06-12 LAB — CBC
HEMATOCRIT: 35.5 % — AB (ref 39.0–52.0)
Hemoglobin: 12.8 g/dL — ABNORMAL LOW (ref 13.0–17.0)
MCH: 32.4 pg (ref 26.0–34.0)
MCHC: 36.1 g/dL — ABNORMAL HIGH (ref 30.0–36.0)
MCV: 89.9 fL (ref 78.0–100.0)
PLATELETS: 84 10*3/uL — AB (ref 150–400)
RBC: 3.95 MIL/uL — AB (ref 4.22–5.81)
RDW: 15.8 % — AB (ref 11.5–15.5)
WBC: 6.4 10*3/uL (ref 4.0–10.5)

## 2016-06-12 MED ORDER — METHOCARBAMOL 500 MG PO TABS: 500.0000 mg | ORAL_TABLET | Freq: Three times a day (TID) | ORAL | Status: DC | PRN

## 2016-06-12 MED ORDER — LORAZEPAM 0.5 MG PO TABS: 0.5000 mg | ORAL_TABLET | Freq: Four times a day (QID) | ORAL | Status: DC | PRN

## 2016-06-12 MED ORDER — RIVAROXABAN 20 MG PO TABS
20.0000 mg | ORAL_TABLET | Freq: Every day | ORAL | Status: DC
  Administered 2016-06-12: 20 mg via ORAL
  Filled 2016-06-12: qty 1

## 2016-06-12 MED ORDER — DESMOPRESSIN ACETATE 0.1 MG PO TABS
0.1000 mg | ORAL_TABLET | Freq: Every day | ORAL | Status: DC
  Administered 2016-06-12: 0.1 mg via ORAL
  Filled 2016-06-12: qty 1

## 2016-06-12 MED ORDER — OXYCODONE HCL 5 MG PO TABS: 5.0000 mg | ORAL_TABLET | Freq: Four times a day (QID) | ORAL | Status: DC | PRN

## 2016-06-12 MED ORDER — TAMSULOSIN HCL 0.4 MG PO CAPS
0.4000 mg | ORAL_CAPSULE | Freq: Every day | ORAL | Status: DC
  Administered 2016-06-12: 0.4 mg via ORAL
  Filled 2016-06-12: qty 1

## 2016-06-12 MED ORDER — POTASSIUM CHLORIDE CRYS ER 20 MEQ PO TBCR
40.0000 meq | EXTENDED_RELEASE_TABLET | ORAL | Status: AC
  Administered 2016-06-12 (×2): 40 meq via ORAL
  Filled 2016-06-12 (×2): qty 2

## 2016-06-12 MED ORDER — DEXAMETHASONE 4 MG PO TABS
4.0000 mg | ORAL_TABLET | Freq: Two times a day (BID) | ORAL | Status: DC
  Administered 2016-06-12 – 2016-06-13 (×2): 4 mg via ORAL
  Filled 2016-06-12 (×3): qty 1

## 2016-06-12 MED ORDER — PANTOPRAZOLE SODIUM 40 MG PO TBEC
40.0000 mg | DELAYED_RELEASE_TABLET | Freq: Two times a day (BID) | ORAL | Status: DC
  Administered 2016-06-12 – 2016-06-13 (×2): 40 mg via ORAL
  Filled 2016-06-12 (×2): qty 1

## 2016-06-12 NOTE — Progress Notes (Signed)
[facility] Progress Note  Chief Complaint:   SBO  Subjective: feels fine, tolerating fulls, + bms and flatus  Objective:  Vital signs in last 24 hours: Temp:  [98.2 F (36.8 C)-98.8 F (37.1 C)] 98.8 F (37.1 C) (12/03 0500) Pulse Rate:  [85-86] 86 (12/03 0500) Resp:  [16-18] 18 (12/03 0500) BP: (96-105)/(61) 105/61 (12/03 0500) SpO2:  [94 %-95 %] 94 % (12/03 0500) Last BM Date: 06/12/16 (This AM) General:   Alert, well-developed,  White male  in NAD EENT:  Normal hearing, non icteric sclera, conjunctive pink.  Heart:  Regular rate and rhythm; no murmurs.  Abdomen:  Soft, nondistended, nontender.  Normal bowel sounds, no masses felt. No hepatomegaly.    Neurologic:  Alert and  oriented x4;  grossly normal neurologically. Psych:  Alert and cooperative. Normal mood and affect.   Intake/Output from previous day: 12/02 0701 - 12/03 0700 In: 1380 [P.O.:1380] Out: 7850 [Urine:7850] Intake/Output this shift: Total I/O In: 360 [P.O.:360] Out: 1200 [Urine:1200]  Lab Results:  Recent Labs  06/10/16 0601 06/11/16 0500 06/12/16 0454  WBC 4.9 5.4 6.4  HGB 9.5* 9.3* 12.8*  HCT 26.9* 25.3* 35.5*  PLT 72* 72* 84*   BMET  Recent Labs  06/10/16 1202 06/11/16 0500 06/11/16 1440 06/11/16 2307 06/12/16 0454  NA 125* 122* 126* 136 140  K 3.4* 3.7  --   --  3.4*  CL 94* 94*  --   --  109  CO2 24 21*  --   --  24  GLUCOSE 125* 94  --   --  114*  BUN 11 10  --   --  13  CREATININE 0.67 0.66  --   --  0.94  CALCIUM 7.5* 7.2*  --   --  9.4  Ct Entero Abd/pelvis W Contast  Result Date: 06/10/2016 CLINICAL DATA:  History of small-bowel obstruction. EXAM: CT ABDOMEN AND PELVIS WITH CONTRAST (ENTEROGRAPHY) TECHNIQUE: Multidetector CT of the abdomen and pelvis during bolus administration of intravenous contrast. Negative oral contrast was given. CONTRAST:  120m ISOVUE-300 IOPAMIDOL (ISOVUE-300) INJECTION 61% COMPARISON:  CT scan 06/06/2016 FINDINGS: Lower  chest: Small right pleural effusion and minimal bibasilar atelectasis. No pericardial effusion. The distal esophagus is grossly normal. Hepatobiliary: No focal hepatic lesions or intrahepatic biliary dilatation. There is a fundal nodule associate with the gallbladder. This was also present on a prior CT scan from 2014. This most likely a small fundal adenomyoma. High attenuation material layering in the gallbladder could be sludge or contrast from vicarious excretion. Pancreas: No mass, inflammation or ductal dilatation. Spleen: Normal size.  No focal lesions. Adrenals/Urinary Tract: The adrenal glands are unremarkable and stable. The kidneys are unremarkable. Stable scarring changes involving the left kidney. No hydronephrosis. Stomach/Bowel: The stomach and duodenum are unremarkable. No findings for small bowel obstruction or free air. In the right lower quadrant/ right pelvis there is an area of focal bowel wall thickening and slight constriction and enhancement(axial images 68 an 69 and coronal image 47). This is the same area identified on the prior CT scan. It could be a focal area of enteritis or strictured narrowing related to prior inflammatory process. No current small bowel obstruction is identified however. Vascular/Lymphatic: No pathologically enlarged lymph nodes. No evidence of abdominal aortic aneurysm Reproductive: Mildly enlarged prostate gland. Other: No pelvic mass or adenopathy. No free pelvic fluid collections. The bladder is decompressed by Foley catheter. No inguinal mass or adenopathy. Musculoskeletal: No significant bony findings. Diffuse  osteoporosis and degenerative changes again noted. IMPRESSION: Focal area of narrowing, wall thickening and enhancement in the distal ileum in the right pelvis correlating with the prior CT scan. This could be a small focus of active inflammation or area of constriction from prior inflammatory process. No current small bowel obstruction. Electronically  Signed   By: Marijo Sanes M.D.   On: 06/10/2016 17:00    Assessment / Plan:  SBO. Clinically improved. Tolerating full liquids, + flatus, liquid stools, no abdominal pain. He can advance to low residue diet which I briefly described to family in the room. GI will sign off, call for questions.   Thrombocytopenia, improving.   Hyponatremia, improved with hypertonic IVF.   Stage IV non small cell lung cancer with brain mets    LOS: 6 days   [name]  06/12/2016, 1:36 PM  Pager number [phone]      Attending physician's note   I have taken an interval history, reviewed the chart and examined the patient. I agree with the Advanced Practitioner's note, impression and recommendations. Resolving SBO.  Advance to low residue diet. No further GI evaluation at this time. No plans for outpatient GI follow up at this time. Available if needed. GI signing off.  Lucio Edward, MD [facility] [phone] Mon-Fri 8a-5p [phone] after 5p, weekends, holidays

## 2016-06-12 NOTE — Progress Notes (Signed)
General Surgery [facility]  Assessment & Plan:  Small bowel obstruction             Clinically resolving - no nausea or emesis overnight - tolerating clear liquid diet             Patient states he had two liquid BM's overnight  Will advance to full liquid diet this AM             Will follow with you        Earnstine Regal, MD, [facility]       Office: [phone]    Subjective: Patient in bed, pleasant, no complaints.  Reports two liquid BM's overnight.  Objective: Vital signs in last 24 hours: Temp:  [98.2 F (36.8 C)-98.8 F (37.1 C)] 98.8 F (37.1 C) (12/03 0500) Pulse Rate:  [71-86] 86 (12/03 0500) Resp:  [16-18] 18 (12/03 0500) BP: (96-105)/(61-72) 105/61 (12/03 0500) SpO2:  [94 %-96 %] 94 % (12/03 0500) Last BM Date: 06/12/16 (This AM)  Intake/Output from previous day: 12/02 0701 - 12/03 0700 In: 1380 [P.O.:1380] Out: 7850 [Urine:7850] Intake/Output this shift: Total I/O In: -  Out: 700 [Urine:700]  Physical Exam: HEENT - sclerae clear, mucous membranes moist Abdomen - soft without distension; no tenderness; no mass  Lab Results:   Recent Labs  06/11/16 0500 06/12/16 0454  WBC 5.4 6.4  HGB 9.3* 12.8*  HCT 25.3* 35.5*  PLT 72* 84*   BMET  Recent Labs  06/11/16 0500  06/11/16 2307 06/12/16 0454  NA 122*  < > 136 140  K 3.7  --   --  3.4*  CL 94*  --   --  109  CO2 21*  --   --  24  GLUCOSE 94  --   --  114*  BUN 10  --   --  13  CREATININE 0.66  --   --  0.94  CALCIUM 7.2*  --   --  9.4  < > = values in this interval not displayed. PT/INR No results for input(s): LABPROT, INR in the last 72 hours. Comprehensive Metabolic Panel:    Component Value Date/Time   NA 140 06/12/2016 0454   NA 136 06/11/2016 2307   NA 137 05/25/2016 1457   NA 144 03/15/2016 0912   K 3.4 (L) 06/12/2016 0454   K 3.7 06/11/2016 0500   K 3.6 05/25/2016 1457   K 3.7 03/15/2016 0912   CL 109 06/12/2016  0454   CL 94 (L) 06/11/2016 0500   CL 109 (H) 12/31/2012 0759   CL 107 12/24/2012 0837   CO2 24 06/12/2016 0454   CO2 21 (L) 06/11/2016 0500   CO2 25 05/25/2016 1457   CO2 24 03/15/2016 0912   BUN 13 06/12/2016 0454   BUN 10 06/11/2016 0500   BUN 10.7 05/25/2016 1457   BUN 10.8 03/15/2016 0912   CREATININE 0.94 06/12/2016 0454   CREATININE 0.66 06/11/2016 0500   CREATININE 0.9 05/25/2016 1457   CREATININE 1.11 04/25/2016 1514   CREATININE 0.94 04/21/2016 1107   CREATININE 0.9 03/15/2016 0912   GLUCOSE 114 (H) 06/12/2016 0454   GLUCOSE 94 06/11/2016 0500   GLUCOSE 118 05/25/2016 1457   GLUCOSE 98 03/15/2016 0912   GLUCOSE 116 (H) 12/31/2012 0759   GLUCOSE 136 (H) 12/24/2012 0837   CALCIUM 9.4 06/12/2016 0454   CALCIUM 7.2 (L) 06/11/2016 0500   CALCIUM  9.6 05/25/2016 1457   CALCIUM 9.3 03/15/2016 0912   AST 22 06/05/2016 2353   AST 23 05/09/2016 0828   AST 19 03/15/2016 0912   AST 27 02/11/2016 0849   ALT 34 06/05/2016 2353   ALT 37 05/09/2016 0828   ALT 18 03/15/2016 0912   ALT 36 02/11/2016 0849   ALKPHOS 58 06/05/2016 2353   ALKPHOS 48 05/09/2016 0828   ALKPHOS 51 03/15/2016 0912   ALKPHOS 63 02/11/2016 0849   BILITOT 0.7 06/05/2016 2353   BILITOT 1.1 05/09/2016 0828   BILITOT 0.97 03/15/2016 0912   BILITOT 0.60 02/11/2016 0849   PROT 7.0 06/05/2016 2353   PROT 6.5 05/09/2016 0828   PROT 6.6 03/15/2016 0912   PROT 6.8 02/11/2016 0849   ALBUMIN 4.1 06/05/2016 2353   ALBUMIN 3.6 05/09/2016 0828   ALBUMIN 3.2 (L) 03/15/2016 0912   ALBUMIN 3.4 (L) 02/11/2016 0849    Studies/Results: Ct Entero Abd/pelvis W Contast  Result Date: 06/10/2016 CLINICAL DATA:  History of small-bowel obstruction. EXAM: CT ABDOMEN AND PELVIS WITH CONTRAST (ENTEROGRAPHY) TECHNIQUE: Multidetector CT of the abdomen and pelvis during bolus administration of intravenous contrast. Negative oral contrast was given. CONTRAST:  159m ISOVUE-300 IOPAMIDOL (ISOVUE-300) INJECTION 61% COMPARISON:  CT  scan 06/06/2016 FINDINGS: Lower chest: Small right pleural effusion and minimal bibasilar atelectasis. No pericardial effusion. The distal esophagus is grossly normal. Hepatobiliary: No focal hepatic lesions or intrahepatic biliary dilatation. There is a fundal nodule associate with the gallbladder. This was also present on a prior CT scan from 2014. This most likely a small fundal adenomyoma. High attenuation material layering in the gallbladder could be sludge or contrast from vicarious excretion. Pancreas: No mass, inflammation or ductal dilatation. Spleen: Normal size.  No focal lesions. Adrenals/Urinary Tract: The adrenal glands are unremarkable and stable. The kidneys are unremarkable. Stable scarring changes involving the left kidney. No hydronephrosis. Stomach/Bowel: The stomach and duodenum are unremarkable. No findings for small bowel obstruction or free air. In the right lower quadrant/ right pelvis there is an area of focal bowel wall thickening and slight constriction and enhancement(axial images 68 an 69 and coronal image 47). This is the same area identified on the prior CT scan. It could be a focal area of enteritis or strictured narrowing related to prior inflammatory process. No current small bowel obstruction is identified however. Vascular/Lymphatic: No pathologically enlarged lymph nodes. No evidence of abdominal aortic aneurysm Reproductive: Mildly enlarged prostate gland. Other: No pelvic mass or adenopathy. No free pelvic fluid collections. The bladder is decompressed by Foley catheter. No inguinal mass or adenopathy. Musculoskeletal: No significant bony findings. Diffuse osteoporosis and degenerative changes again noted. IMPRESSION: Focal area of narrowing, wall thickening and enhancement in the distal ileum in the right pelvis correlating with the prior CT scan. This could be a small focus of active inflammation or area of constriction from prior inflammatory process. No current small bowel  obstruction. Electronically Signed   By: Hector SanesM.D.   On: 06/10/2016 17:00      Brenen [name] M 06/12/2016  Patient ID: [name] [name]   DOB: [id number] 75y.o.   MRN: [id number]

## 2016-06-12 NOTE — Progress Notes (Signed)
PROGRESS NOTE    [name]  VXY:[id number] DOB: [birth year] DOA: 06/05/2016 PCP: [name], MD   Brief Narrative: 75 y.o. male with a past medical history significant for metastatic lung cancer, PE on Xarelto who presents with intermittent abdominal pain and vomiting.CT of the abdomen showed small bowel obstruction with transition point near an area of "inflammatory process".Placement of NG tube failed, and TRH were asked to evaluate for admission for SBO.  (The patient was first diagnosed with cancer in 2013, with multiple myeloma. He underwent 2 rounds of Velcade, Revlimid, carfilzomib then had a BMT at [facility].  Then in 2017 he was diagnosed with brain metastases, and found to have a lung primary.  He has undergone skull radiation and is about to start [place] this Tuesday.)  Assessment & Plan: # Small bowel obstruction: -tolerated CLDa nd Full diet -Repeated abdominal x-ray of abdomen demonstrated resolution of obstruction; holding surgery now. -will avoid as much as possible IV narcotics -continue supportive care.  -Follow Gen. surgery and GI for further plan and recommendation. Appreciate assistance and inputs -CT enterography with confirmation on resolution of SBO; demonstrating some area of inflammation/narrowing around ileum; will follow GI rec's. -no further intervention planned -will continue advancing diet and will resume home medication regimen PO  # Hypokalemia/hypomagnesemia:   -most likely associated with GI losses through NGT and poor PO intake -will monitor and replete as needed   #hypernatremia/hyponatremia: Patient reported that he was diagnosed with diabetes insipidus and takes DDAVP at night.  -hypernatremia resolved; subsequently developed symptomatic hyponatremia  -after 2 boluses of saline 3%; neurologic symptoms resolved completely and patient sodium is 140 -will now resume home medication regimen    # Acute kidney injury likely due to dehydration and  urinary retention:  -Serum creatinine level back to normal range after fluids resuscitation. -Continue intermittent BMET to follow renal function trend -currently with foley in place and with good urine output  #  History of pulmonary embolism:  -will resume xarelto  #thombocytopenia -no signs of bleeding -stable and rising 84,000 range -will monitor -resuming his xarelto today (12/3)  #  Brain metastases ([place]) -Adenocarcinoma of right lung, stage 4 ([place]):  -will continue decadron as prior to admission indicated   # Acute urinary retention -will keep foley in place as recommended by urology service -will start patient on flomax -no signs of infection appreciated on UA  # Metabolic encephalopathy -no infection appreciated -resolved with stabilization of abnormal electrolytes (especially sodium level) -minimize narcotics and benzos -mentation back to normal  DVT prophylaxis:SCD Code Status:full Family Communication:no Disposition Plan:to be determine. Patient feeling better, no CP, no SOB and denies abd pain. Per x-ray and CT enterography no SBO seen. Patient with focal narrowing around ileum. Will advance diet and work up his confusion.  Consultants:   Surgery team  GI  Urology   Procedures:  see below for x-ray reports   Antimicrobials:   none  Subjective:  No fever, no CP, no SOB. Patient AAOX3; in no distress. Denies abd pain, nausea and vomiting. Reports BM overnight and is tolerating Full liquid diet   Objective: Vitals:   06/11/16 1318 06/11/16 2236 06/12/16 0500 06/12/16 1356  BP: 100/72 96/61 105/61 104/60  Pulse: 71 85 86 95  Resp: '16 16 18 16  '$ Temp: 98.3 F (36.8 C) 98.2 F (36.8 C) 98.8 F (37.1 C) 98.3 F (36.8 C)  TempSrc: Oral Oral Oral Oral  SpO2: 96% 95% 94% 97%  Weight:  Height:        Intake/Output Summary (Last 24 hours) at 06/12/16 1418 Last data filed at 06/12/16 1356  Gross per 24 hour  Intake             1080 ml    Output             7400 ml  Net            -6320 ml   Filed Weights   06/06/16 0900  Weight: 94.6 kg (208 lb 8.9 oz)    Examination:  General exam: Pleasant male sitting on bed, AAOX3 and conversant. Denies HA. No nausea, vomiting or abdominal pain currently. Foley in place. Passing gas and move his bowels overnight. Respiratory system: Clear bilateral, no wheezing or crackles Cardiovascular system: Regular rate rhythm, S1 and S2 normal. No pedal edema Gastrointestinal system: Abdomen soft, ND, no tenderness. Bowel sound positive. Central nervous system: Alert awake, oriented. No focal neurological deficit Extremities: Symmetric 5 x 5 power. Skin: No rashes, lesions or ulcers Psychiatry: Judgement and insight appear normal. Mood & affect appropriate.    Data Reviewed: I have personally reviewed following labs and imaging studies  CBC:  Recent Labs Lab 06/05/16 2353  06/08/16 0627 06/09/16 0610 06/10/16 0601 06/11/16 0500 06/12/16 0454  WBC 9.6  < > 7.4 6.4 4.9 5.4 6.4  NEUTROABS 8.5*  --   --   --   --   --   --   HGB 14.7  < > 11.5* 10.5* 9.5* 9.3* 12.8*  HCT 41.1  < > 34.8* 31.2* 26.9* 25.3* 35.5*  MCV 90.9  < > 95.3 96.0 92.4 87.8 89.9  PLT 181  < > 120* 94* 72* 72* 84*  < > = values in this interval not displayed. Basic Metabolic Panel:  Recent Labs Lab 06/06/16 0833 06/07/16 0418  06/08/16 0627 06/09/16 0610 06/10/16 1202 06/11/16 0500 06/11/16 1440 06/11/16 2307 06/12/16 0454  NA 136 154*  < > 143 136 125* 122* 126* 136 140  K 3.1* 4.7  < > 3.9 3.6 3.4* 3.7  --   --  3.4*  CL 102 124*  < > 113* 106 94* 94*  --   --  109  CO2 24 25  < > '23 23 24 '$ 21*  --   --  24  GLUCOSE 114* 138*  < > 151* 141* 125* 94  --   --  114*  BUN 17 14  < > '19 15 11 10  '$ --   --  13  CREATININE 1.58* 1.18  < > 1.16 0.92 0.67 0.66  --   --  0.94  CALCIUM 9.3 9.1  < > 8.2* 7.5* 7.5* 7.2*  --   --  9.4  MG 1.6* 2.4  --   --   --   --   --   --   --   --   < > = values in this  interval not displayed. GFR: Estimated Creatinine Clearance: 77.1 mL/min (by C-G formula based on SCr of 0.94 mg/dL).   Liver Function Tests:  Recent Labs Lab 06/05/16 2353  AST 22  ALT 34  ALKPHOS 58  BILITOT 0.7  PROT 7.0  ALBUMIN 4.1    Recent Labs Lab 06/05/16 2353  LIPASE 42   Coagulation Profile:  Recent Labs Lab 06/07/16 1100  INR 1.13   Cardiac Enzymes:  Recent Labs Lab 06/05/16 2353  TROPONINI <0.03   CBG:  Recent Labs Lab  06/11/16 1157 06/11/16 1649 06/11/16 2233 06/12/16 0725 06/12/16 1153  GLUCAP 186* 113* 99 140* 109*    Recent Results (from the past 240 hour(s))  C difficile quick scan w PCR reflex     Status: None   Collection Time: 06/06/16  4:07 AM  Result Value Ref Range Status   C Diff antigen NEGATIVE NEGATIVE Final   C Diff toxin NEGATIVE NEGATIVE Final   C Diff interpretation No C. difficile detected.  Final  Gastrointestinal Panel by PCR , Stool     Status: None   Collection Time: 06/06/16  4:07 AM  Result Value Ref Range Status   Campylobacter species NOT DETECTED NOT DETECTED Final   Plesimonas shigelloides NOT DETECTED NOT DETECTED Final   Salmonella species NOT DETECTED NOT DETECTED Final   Yersinia enterocolitica NOT DETECTED NOT DETECTED Final   Vibrio species NOT DETECTED NOT DETECTED Final   Vibrio cholerae NOT DETECTED NOT DETECTED Final   Enteroaggregative E coli (EAEC) NOT DETECTED NOT DETECTED Final   Enteropathogenic E coli (EPEC) NOT DETECTED NOT DETECTED Final   Enterotoxigenic E coli (ETEC) NOT DETECTED NOT DETECTED Final   Shiga like toxin producing E coli (STEC) NOT DETECTED NOT DETECTED Final   Shigella/Enteroinvasive E coli (EIEC) NOT DETECTED NOT DETECTED Final   Cryptosporidium NOT DETECTED NOT DETECTED Final   Cyclospora cayetanensis NOT DETECTED NOT DETECTED Final   Entamoeba histolytica NOT DETECTED NOT DETECTED Final   Giardia lamblia NOT DETECTED NOT DETECTED Final   Adenovirus F40/41 NOT  DETECTED NOT DETECTED Final   Astrovirus NOT DETECTED NOT DETECTED Final   Norovirus GI/GII NOT DETECTED NOT DETECTED Final   Rotavirus A NOT DETECTED NOT DETECTED Final   Sapovirus (I, II, IV, and V) NOT DETECTED NOT DETECTED Final  Surgical PCR screen     Status: None   Collection Time: 06/09/16  7:54 AM  Result Value Ref Range Status   MRSA, PCR NEGATIVE NEGATIVE Final   Staphylococcus aureus NEGATIVE NEGATIVE Final    Comment:        The Xpert SA Assay (FDA approved for NASAL specimens in patients over 35 years of age), is one component of a comprehensive surveillance program.  Test performance has been validated by [facility] for patients greater than or equal to 89 year old. It is not intended to diagnose infection nor to guide or monitor treatment.   Culture, Urine     Status: None   Collection Time: 06/10/16 12:02 PM  Result Value Ref Range Status   Specimen Description URINE, CATHETERIZED  Final   Special Requests NONE  Final   Culture NO GROWTH Performed at [facility]   Final   Report Status 06/11/2016 FINAL  Final     Radiology Studies: Ct Entero Abd/pelvis W Contast  Result Date: 06/10/2016 CLINICAL DATA:  History of small-bowel obstruction. EXAM: CT ABDOMEN AND PELVIS WITH CONTRAST (ENTEROGRAPHY) TECHNIQUE: Multidetector CT of the abdomen and pelvis during bolus administration of intravenous contrast. Negative oral contrast was given. CONTRAST:  114m ISOVUE-300 IOPAMIDOL (ISOVUE-300) INJECTION 61% COMPARISON:  CT scan 06/06/2016 FINDINGS: Lower chest: Small right pleural effusion and minimal bibasilar atelectasis. No pericardial effusion. The distal esophagus is grossly normal. Hepatobiliary: No focal hepatic lesions or intrahepatic biliary dilatation. There is a fundal nodule associate with the gallbladder. This was also present on a prior CT scan from 2014. This most likely a small fundal adenomyoma. High attenuation material layering in the gallbladder  could be sludge or contrast  from vicarious excretion. Pancreas: No mass, inflammation or ductal dilatation. Spleen: Normal size.  No focal lesions. Adrenals/Urinary Tract: The adrenal glands are unremarkable and stable. The kidneys are unremarkable. Stable scarring changes involving the left kidney. No hydronephrosis. Stomach/Bowel: The stomach and duodenum are unremarkable. No findings for small bowel obstruction or free air. In the right lower quadrant/ right pelvis there is an area of focal bowel wall thickening and slight constriction and enhancement(axial images 68 an 69 and coronal image 47). This is the same area identified on the prior CT scan. It could be a focal area of enteritis or strictured narrowing related to prior inflammatory process. No current small bowel obstruction is identified however. Vascular/Lymphatic: No pathologically enlarged lymph nodes. No evidence of abdominal aortic aneurysm Reproductive: Mildly enlarged prostate gland. Other: No pelvic mass or adenopathy. No free pelvic fluid collections. The bladder is decompressed by Foley catheter. No inguinal mass or adenopathy. Musculoskeletal: No significant bony findings. Diffuse osteoporosis and degenerative changes again noted. IMPRESSION: Focal area of narrowing, wall thickening and enhancement in the distal ileum in the right pelvis correlating with the prior CT scan. This could be a small focus of active inflammation or area of constriction from prior inflammatory process. No current small bowel obstruction. Electronically Signed   By: Marijo Sanes M.D.   On: 06/10/2016 17:00    Scheduled Meds: . desmopressin  0.1 mg Oral QHS  . dexamethasone  4 mg Oral Q12H  . pantoprazole  40 mg Oral BID  . rivaroxaban  20 mg Oral Q supper  . tamsulosin  0.4 mg Oral QPC supper   Continuous Infusions:    LOS: 6 days   Barton Dubois, MD [facility] Pager [phone]  If 7PM-7AM, please contact  night-coverage [email or web address] Password [email or web address] 06/12/2016, 2:18 PM

## 2016-06-13 ENCOUNTER — Other Ambulatory Visit: Payer: Self-pay | Admitting: Oncology

## 2016-06-13 ENCOUNTER — Encounter: Payer: Self-pay | Admitting: Radiation Oncology

## 2016-06-13 ENCOUNTER — Telehealth: Payer: Self-pay | Admitting: Family Medicine

## 2016-06-13 LAB — BASIC METABOLIC PANEL
Anion gap: 5 (ref 5–15)
BUN: 15 mg/dL (ref 6–20)
CO2: 26 mmol/L (ref 22–32)
Calcium: 9.4 mg/dL (ref 8.9–10.3)
Chloride: 113 mmol/L — ABNORMAL HIGH (ref 101–111)
Creatinine, Ser: 0.91 mg/dL (ref 0.61–1.24)
GFR calc Af Amer: >60 mL/min (ref >60)
GFR calc non Af Amer: >60 mL/min (ref >60)
Glucose, Bld: 142 mg/dL — ABNORMAL HIGH (ref 65–99)
POTASSIUM: 4.1 mmol/L (ref 3.5–5.1)
Sodium: 144 mmol/L (ref 135–145)

## 2016-06-13 LAB — CBC
HEMATOCRIT: 37.9 % — AB (ref 39.0–52.0)
Hemoglobin: 12.9 g/dL — ABNORMAL LOW (ref 13.0–17.0)
MCH: 32.5 pg (ref 26.0–34.0)
MCHC: 34 g/dL (ref 30.0–36.0)
MCV: 95.5 fL (ref 78.0–100.0)
PLATELETS: 68 10*3/uL — AB (ref 150–400)
RBC: 3.97 MIL/uL — ABNORMAL LOW (ref 4.22–5.81)
RDW: 17.5 % — AB (ref 11.5–15.5)
WBC: 9.6 10*3/uL (ref 4.0–10.5)

## 2016-06-13 LAB — GLUCOSE, CAPILLARY
GLUCOSE-CAPILLARY: 129 mg/dL — AB (ref 65–99)
GLUCOSE-CAPILLARY: 142 mg/dL — AB (ref 65–99)

## 2016-06-13 MED ORDER — HEPARIN SOD (PORK) LOCK FLUSH 100 UNIT/ML IV SOLN
500.0000 [IU] | INTRAVENOUS | Status: AC | PRN
  Administered 2016-06-13: 500 [IU]

## 2016-06-13 MED ORDER — POLYETHYLENE GLYCOL 3350 17 G PO PACK: 17.0000 g | PACK | Freq: Every day | ORAL | 1 refills | Status: DC | PRN

## 2016-06-13 MED ORDER — TAMSULOSIN HCL 0.4 MG PO CAPS: 0.8000 mg | ORAL_CAPSULE | ORAL | 11 refills | Status: DC

## 2016-06-13 MED ORDER — TAMSULOSIN HCL 0.4 MG PO CAPS: 0.8000 mg | ORAL_CAPSULE | Freq: Every day | ORAL | Status: DC

## 2016-06-13 MED ORDER — DEXAMETHASONE 4 MG PO TABS: 4.0000 mg | ORAL_TABLET | Freq: Two times a day (BID) | ORAL | Status: DC

## 2016-06-13 NOTE — Telephone Encounter (Signed)
Noted.  I'm aware of the urinary retention issues he has been having during his hospitalization.

## 2016-06-13 NOTE — Progress Notes (Signed)
[facility] Progress Note  4 Days Post-Op  Subjective: Denies abdominal pain. Eager for discharge. + flatus. Tolerating diet without nausea or vomiting. No BM today but had two early yesterday.  Objective: Vital signs in last 24 hours: Temp:  [97.4 F (36.3 C)-98.3 F (36.8 C)] 97.4 F (36.3 C) (12/04 0508) Pulse Rate:  [86-96] 86 (12/04 0508) Resp:  [16] 16 (12/04 0508) BP: (104-126)/(60-85) 126/85 (12/04 0508) SpO2:  [95 %-97 %] 97 % (12/04 0508) Last BM Date: 06/12/16  Intake/Output from previous day: 12/03 0701 - 12/04 0700 In: 840 [P.O.:840] Out: 4825 [Urine:4825] Intake/Output this shift: Total I/O In: 360 [P.O.:360] Out: -   PE: Gen:  Alert, NAD, pleasant Pulm:  CTA, no W/R/R Abd: Soft, NT/ND, +BS GU: foley in place  Lab Results:   Recent Labs  06/12/16 0454 06/13/16 0428  WBC 6.4 9.6  HGB 12.8* 12.9*  HCT 35.5* 37.9*  PLT 84* 68*   BMET  Recent Labs  06/12/16 0454 06/13/16 0428  NA 140 144  K 3.4* 4.1  CL 109 113*  CO2 24 26  GLUCOSE 114* 142*  BUN 13 15  CREATININE 0.94 0.91  CALCIUM 9.4 9.4   PT/INR No results for input(s): LABPROT, INR in the last 72 hours. CMP     Component Value Date/Time   NA 144 06/13/2016 0428   NA 137 05/25/2016 1457   K 4.1 06/13/2016 0428   K 3.6 05/25/2016 1457   CL 113 (H) 06/13/2016 0428   CL 109 (H) 12/31/2012 0759   CO2 26 06/13/2016 0428   CO2 25 05/25/2016 1457   GLUCOSE 142 (H) 06/13/2016 0428   GLUCOSE 118 05/25/2016 1457   GLUCOSE 116 (H) 12/31/2012 0759   BUN 15 06/13/2016 0428   BUN 10.7 05/25/2016 1457   CREATININE 0.91 06/13/2016 0428   CREATININE 0.9 05/25/2016 1457   CALCIUM 9.4 06/13/2016 0428   CALCIUM 9.6 05/25/2016 1457   PROT 7.0 06/05/2016 2353   PROT 6.6 03/15/2016 0912   ALBUMIN 4.1 06/05/2016 2353   ALBUMIN 3.2 (L) 03/15/2016 0912   AST 22 06/05/2016 2353   AST 19 03/15/2016 0912   ALT 34 06/05/2016 2353   ALT 18 03/15/2016 0912   ALKPHOS 58 06/05/2016  2353   ALKPHOS 51 03/15/2016 0912   BILITOT 0.7 06/05/2016 2353   BILITOT 0.97 03/15/2016 0912   GFRNONAA >60 06/13/2016 0428   GFRAA >60 06/13/2016 0428   Lipase     Component Value Date/Time   LIPASE 42 06/05/2016 2353       Studies/Results: No results found.  Anti-infectives: Anti-infectives    Start     Dose/Rate Route Frequency Ordered Stop   06/09/16 1000  clindamycin (CLEOCIN) IVPB 900 mg  Status:  Discontinued     900 mg 100 mL/hr over 30 Minutes Intravenous On call to O.R. 06/09/16 0734 06/10/16 0735   06/09/16 1000  gentamicin (GARAMYCIN) 400 mg in dextrose 5 % 100 mL IVPB  Status:  Discontinued     5 mg/kg  80.3 kg (Adjusted) 110 mL/hr over 60 Minutes Intravenous On call to O.R. 06/09/16 0734 06/10/16 0735      Assessment/Plan Small bowel obstruction Clinically resolving - no nausea or emesis overnight - tolerating diet Patient states he had two liquid BM's yesterday, + flatus             stable for discharge if tolerating soft diet   recommend maintaining low residue diet for at least one week  LOS: 7 days    [facility] 06/13/2016, 9:50 AM Pager: [phone] Consults: [phone] Mon-Fri 7:00 am-4:30 pm Sat-Sun 7:00 am-11:30 am

## 2016-06-13 NOTE — Progress Notes (Signed)
Discharge instructions discussed with patient and family, verbalized agreement and understanding.  Instructed patient and wife on use of leg bag, and foley catheter care, verbalized  Understanding and agreement

## 2016-06-13 NOTE — Progress Notes (Signed)
PT Cancellation Note  Patient Details Name: MARKEESE BOYAJIAN MRN: [id number] DOB: 04-Aug-1940   Cancelled Treatment:    Reason Eval/Treat Not Completed: PT screened, no needs identified, will sign off (spoke with pt and family. Both denied any further need for PT services. Will sign off at pt's request. )   Weston Anna, MPT Pager: [phone]

## 2016-06-13 NOTE — Discharge Summary (Signed)
Physician Discharge Summary  [name] GYJ:[id number] DOB: [birth year] DOA: 06/05/2016  PCP: [name], MD  Admit date: 06/05/2016 Discharge date: 06/13/2016  Time spent: 35 minutes  Recommendations for Outpatient Follow-up:  Repeat BMET to follow electrolytes and renal function. Repeat CBC to follow Hgb trend and platelets count. Follow patient BP and volume status. Assure follow up with urology for further evaluation and treatment of his acute urinary retention.  Discharge Diagnoses:  Principal Problem:   SBO (small bowel obstruction) Active Problems:   History of pulmonary embolism   Brain metastases (HCC)   Adenocarcinoma of right lung, stage 4 (HCC)   Hypokalemia   Hypernatremia   Diabetes insipidus secondary to vasopressin deficiency (HCC)   Encephalopathy, metabolic   Thrombocytopenia (HCC)   Abnormal CT scan, small bowel   Discharge Condition: stable and improved. Discharge home with instructions to follow up with PCP in 10 days, with urology on 06/15/16 and with oncology service (appointment to be arranged by ofice after discharge).  Diet recommendation: soft-low residue diet  Filed Weights   06/06/16 0900  Weight: 94.6 kg (208 lb 8.9 oz)    History of present illness:  As per Dr. Loleta Books H&P written on 06/05/16. 75 y.o. male with a past medical history significant for metastatic lung cancer, PE on Xarelto who presents with intermittent abdominal pain and vomiting.  The patient was in his usual state of health until a few weeks ago when he started to have intermittent nausea and vomiting, and intermittent distended and firm abdomen. He actually discussed this with his primary oncologist, who ordered an outpatient flat radiograph of the abdomen that showed a nonobstructive bowel gas pattern.  Then this weekend on Saturday, he vomited several times during the day.  In the evening he went out to dinner and afterwards vomited several times that night.  Sunday,  he went to church, but in the afternoon his abdomen got "hard as a rock", developed a severe, generalized abdominal pain, and he couldn't keep anything down, so he came to the ER.  Hospital Course:  # Small bowel obstruction: -tolerating soft/low residue diet at discharge -Repeated abdominal x-ray of abdomen and CT enterography confirmed resolution of obstruction. -patient advise to keep himself well hydrated -appreciate assistance and recommendations for general surgery and GI. -CT enterography also showed an area of inflammation/narrowing around ileum -no further intervention planned as an inpatient  # Hypokalemia/hypomagnesemia:   -most likely associated with GI losses through NGT and poor PO intake. -repleted and WNL at discharge  #hypernatremia/hyponatremia: Patient reported that he was diagnosed with diabetes insipidus and takes DDAVP at night.  -presented with hypernatremia initially, which resolved; subsequently developed symptomatic hyponatremia  -after 2 boluses of saline 3%; patient's neurologic symptoms and hyponatremia resolved completely -at discharge sodium was 144 -advise to maintain adequate hydration; home DDAVP regimen resumed at discharge  # Acute kidney injury likely due to dehydration and urinary retention:  -Serum creatinine level back to normal range after fluids resuscitation. -recommend to repeat BMET at follow up visit to reassess renal function trend. -currently with foley in place and with good urine output  #  History of pulmonary embolism:  -xarelto resumed at discharge -while inpatient patient was on heparin drip  # Thombocytopenia -no signs of bleeding -platelets stable and rising at discharge (84,000 range) -will recommend CBC at follow up visit, to monitor trend -xarelto resumed at discharge   #  Brain metastases ([place]) -Adenocarcinoma of right lung, stage 4 ([place]):  -will  continue decadron as prior to admission indicated   # Acute  urinary retention -will keep foley in place as recommended by urology service -patient started on flomax -will follow up with urology system at discharge -no signs of infection appreciated on UA  # Metabolic encephalopathy -no infection appreciated on UA -resolved with stabilization of abnormal electrolytes (especially sodium level) -mentation back to normal at time of discharge  Procedures:  See below for x-ray reports   Consultations:  GI  General surgery  Oncology   Discharge Exam: Vitals:   06/12/16 2145 06/13/16 0508  BP: 106/83 126/85  Pulse: 96 86  Resp: 16 16  Temp: 97.9 F (36.6 C) 97.4 F (36.3 C)   General exam: Pleasant male sitting on bed, AAOX3 and conversant. Denies HA. No nausea, vomiting or abdominal pain currently. Foley in place. Passing gas and has moved his bowels 2 times overnight. Respiratory system: Clear bilateral, no wheezing or crackles appreciated on exam. Cardiovascular system: Regular rate rhythm, S1 and S2 normal. No pedal edema Gastrointestinal system: Abdomen soft, ND, no tenderness. Bowel sound positive. Central nervous system: Alert awake, oriented. No focal neurological deficit Extremities: Symmetric 5 x 5 power. Skin: No rashes, lesions or ulcers Psychiatry: Judgement and insight appear normal. Mood & affect appropriate.   Discharge Instructions   Discharge Instructions    Diet - low sodium heart healthy    Complete by:  As directed    Discharge instructions    Complete by:  As directed    Follow up with PCP in 10 days Follow up with urology service as instructed Maintain yourself well hydrated Follow low residue diet   Increase activity slowly    Complete by:  As directed      Current Discharge Medication List    START taking these medications   Details  polyethylene glycol (MIRALAX) packet Take 17 g by mouth daily as needed for mild constipation. Qty: 28 each, Refills: 1    tamsulosin (FLOMAX) 0.4 MG CAPS capsule  Take 2 capsules (0.8 mg total) by mouth daily after supper. Qty: 60 capsule, Refills: 11      CONTINUE these medications which have CHANGED   Details  dexamethasone (DECADRON) 4 MG tablet Take 1 tablet (4 mg total) by mouth 2 (two) times daily.      CONTINUE these medications which have NOT CHANGED   Details  acetaminophen (TYLENOL) 500 MG tablet Take 500 mg by mouth every 6 (six) hours as needed for moderate pain or headache.    ascorbic acid (VITAMIN C) 500 MG tablet Take 500 mg by mouth 2 (two) times daily.     cholecalciferol (VITAMIN D) 1000 UNITS tablet Take 1,000 Units by mouth daily.    desmopressin (DDAVP) 0.1 MG tablet Take 1 tablet (0.1 mg total) by mouth at bedtime. Qty: 30 tablet, Refills: 0    fish oil-omega-3 fatty acids 1000 MG capsule Take 1 g by mouth daily.     furosemide (LASIX) 20 MG tablet Take 1 tablet (20 mg total) by mouth daily. Qty: 30 tablet, Refills: 0   Associated Diagnoses: Peripheral edema    LORazepam (ATIVAN) 0.5 MG tablet Take 1 tab PO Q 6 hours PRN nausea or anxiety Qty: 30 tablet, Refills: 0   Associated Diagnoses: Brain metastases (HCC)    methocarbamol (ROBAXIN) 500 MG tablet TAKE 1 TO 2 TABLETS BY MOUTH EVERY 6 HOURS AS NEEDED FOR MUSCLE SPASMS Qty: 60 tablet, Refills: 0    Multiple Vitamin (MULTIVITAMIN WITH MINERALS)  TABS Take 1 tablet by mouth daily.    ondansetron (ZOFRAN) 8 MG tablet Take 1 tablet (8 mg total) by mouth every 8 (eight) hours as needed for nausea or vomiting. Qty: 30 tablet, Refills: 1   Associated Diagnoses: Brain metastases (HCC)    oxyCODONE (ROXICODONE) 5 MG immediate release tablet Take 1-2 tabs PO Q 6 hours PRN pain. Qty: 30 tablet, Refills: 0   Associated Diagnoses: Brain metastases (HCC)    prochlorperazine (COMPAZINE) 10 MG tablet Take 1 tablet (10 mg total) by mouth every 6 (six) hours as needed. Qty: 30 tablet, Refills: 1    temazepam (RESTORIL) 15 MG capsule Take 1 capsule (15 mg total) by mouth at  bedtime as needed. Qty: 60 capsule, Refills: 0   Associated Diagnoses: Multiple myeloma not having achieved remission ([place]); Multiple myeloma, remission status unspecified (HCC)    XARELTO 20 MG TABS tablet TAKE 1 TABLET BY MOUTH  DAILY Qty: 90 tablet, Refills: 1    pantoprazole (PROTONIX) 40 MG tablet TAKE 1 TABLET BY MOUTH TWO  TIMES DAILY Qty: 180 tablet, Refills: 2    Probiotic Product (ALIGN) 4 MG CAPS Take 1 capsule by mouth daily. Reported on 09/10/2015    zolendronic acid (ZOMETA) 4 MG/5ML injection Inject 4 mg into the vein every 3 (three) months.       STOP taking these medications     dicyclomine (BENTYL) 10 MG capsule      Diphenhyd-Hydrocort-Nystatin (FIRST-DUKES MOUTHWASH) SUSP      hydrOXYzine (VISTARIL) 50 MG capsule      nystatin (MYCOSTATIN) 100000 UNIT/ML suspension      oxyCODONE-acetaminophen (PERCOCET/ROXICET) 5-325 MG tablet        Allergies  Allergen Reactions  . Tramadol Palpitations    "Made my chest feel tight"  . Amoxicillin     REACTION: rash  . Penicillins Rash    Has patient had a PCN reaction causing immediate rash, facial/tongue/throat swelling, SOB or lightheadedness with hypotension: unknown Has patient had a PCN reaction causing severe rash involving mucus membranes or skin necrosis: unknown Has patient had a PCN reaction that required hospitalization: no  Has patient had a PCN reaction occurring within the last 10 years: no  If all of the above answers are "NO", then may proceed with Cephalosporin use.    Follow-up Information    Call [facility]   Why:  For catheter removal on Wed. Contact information: [address]  [place] [address] [place] [address] [phone]        [name],[name], MD. Schedule an appointment as soon as possible for a visit in 2 week(s).   Specialty:  Family Medicine Contact information: [place] [address] [phone]           The results of significant  diagnostics from this hospitalization (including imaging, microbiology, ancillary and laboratory) are listed below for reference.    Significant Diagnostic Studies: Dg Abd 1 View  Result Date: 06/09/2016 CLINICAL DATA:  Followup Small-bowel obstruction EXAM: ABDOMEN - 1 VIEW COMPARISON:  06/08/2016 FINDINGS: Scattered large and small bowel gas is seen. The small bowel dilatation noted on the prior exam has resolved. No free air is seen. Stable degenerative change of the lumbar spine is noted. Nasogastric catheter is noted within the stomach. IMPRESSION: Resolution of previously seen small bowel obstruction. Electronically Signed   By: Inez Catalina M.D.   On: 06/09/2016 07:29   Ct Abdomen Pelvis W Contrast  Result Date: 06/06/2016 CLINICAL  DATA:  Increasing abdominal pain for 3 days. Nausea and vomiting. White cell count 9.6. History of lung cancer, melanoma, multiple myeloma, diabetes. EXAM: CT ABDOMEN AND PELVIS WITH CONTRAST TECHNIQUE: Multidetector CT imaging of the abdomen and pelvis was performed using the standard protocol following bolus administration of intravenous contrast. CONTRAST:  100 mL Isovue-300 COMPARISON:  04/28/2016 FINDINGS: Lower chest: Atelectasis in the lung bases. Mildly distended fluid-filled esophagus may indicate reflux. Prominent lymph nodes noted in the inferior right hilum measuring up to 1.4 cm. These are smaller than on the previous study. Hepatobiliary: No focal liver abnormality is seen. No gallstones, gallbladder wall thickening, or biliary dilatation. Pancreas: Unremarkable. No pancreatic ductal dilatation or surrounding inflammatory changes. Spleen: Normal in size without focal abnormality. Adrenals/Urinary Tract: No adrenal gland nodules. Focal scarring in the left kidney. Renal nephrograms are symmetrical. No hydronephrosis or hydroureter. Bladder wall is not thickened. Stomach/Bowel: Moderately fluid distended stomach without wall thickening. Fluid-filled distended  small bowel with transition zone in the right lower quadrant representing mid to distal ileum. At the transition zone, there is focal wall thickening and edema suggesting an inflammatory process. Fluid-filled nondistended colon. Vascular/Lymphatic: Aortic atherosclerosis. No enlarged abdominal or pelvic lymph nodes. Reproductive: Prostate gland is mildly enlarged, measuring 4.3 cm diameter. Other: No abdominal wall hernia or abnormality. No abdominopelvic ascites. Musculoskeletal: Degenerative changes throughout the lumbar spine. Diffuse bone demineralization. No destructive bone lesions. IMPRESSION: Small bowel obstruction with transition zone in the right lower quadrant, likely due to inflammatory stricture. Probable gastroesophageal reflux. Fluid-filled nondistended colon. Right hilar lymphadenopathy is decreased since prior study. Electronically Signed   By: Lucienne Capers M.D.   On: 06/06/2016 04:30   Ct Entero Abd/pelvis W Contast  Result Date: 06/10/2016 CLINICAL DATA:  History of small-bowel obstruction. EXAM: CT ABDOMEN AND PELVIS WITH CONTRAST (ENTEROGRAPHY) TECHNIQUE: Multidetector CT of the abdomen and pelvis during bolus administration of intravenous contrast. Negative oral contrast was given. CONTRAST:  116m ISOVUE-300 IOPAMIDOL (ISOVUE-300) INJECTION 61% COMPARISON:  CT scan 06/06/2016 FINDINGS: Lower chest: Small right pleural effusion and minimal bibasilar atelectasis. No pericardial effusion. The distal esophagus is grossly normal. Hepatobiliary: No focal hepatic lesions or intrahepatic biliary dilatation. There is a fundal nodule associate with the gallbladder. This was also present on a prior CT scan from 2014. This most likely a small fundal adenomyoma. High attenuation material layering in the gallbladder could be sludge or contrast from vicarious excretion. Pancreas: No mass, inflammation or ductal dilatation. Spleen: Normal size.  No focal lesions. Adrenals/Urinary Tract: The adrenal  glands are unremarkable and stable. The kidneys are unremarkable. Stable scarring changes involving the left kidney. No hydronephrosis. Stomach/Bowel: The stomach and duodenum are unremarkable. No findings for small bowel obstruction or free air. In the right lower quadrant/ right pelvis there is an area of focal bowel wall thickening and slight constriction and enhancement(axial images 68 an 69 and coronal image 47). This is the same area identified on the prior CT scan. It could be a focal area of enteritis or strictured narrowing related to prior inflammatory process. No current small bowel obstruction is identified however. Vascular/Lymphatic: No pathologically enlarged lymph nodes. No evidence of abdominal aortic aneurysm Reproductive: Mildly enlarged prostate gland. Other: No pelvic mass or adenopathy. No free pelvic fluid collections. The bladder is decompressed by Foley catheter. No inguinal mass or adenopathy. Musculoskeletal: No significant bony findings. Diffuse osteoporosis and degenerative changes again noted. IMPRESSION: Focal area of narrowing, wall thickening and enhancement in the distal ileum in the right  pelvis correlating with the prior CT scan. This could be a small focus of active inflammation or area of constriction from prior inflammatory process. No current small bowel obstruction. Electronically Signed   By: Marijo Sanes M.D.   On: 06/10/2016 17:00   Dg Abd 2 Views  Result Date: 06/10/2016 CLINICAL DATA:  Small-bowel obstruction.  Follow-up exam . EXAM: ABDOMEN - 2 VIEW COMPARISON:  06/09/2016. FINDINGS: Soft tissue structures are unremarkable. Several loops of distended small bowel are noted on today's exam. Colonic gas pattern is normal. These findings are consistent with partial small bowel obstruction. No free air. PowerPort catheter noted with tip projected over right atrium. Diffuse degenerative changes thoracolumbar spine. Multiple stable mild compression fractures. Degenerative  changes both hips. IMPRESSION: Several slightly distended loops small bowel noted on today's exam. Colonic gas pattern is normal. Findings suggest partial small bowel obstruction. No free air. Electronically Signed   By: Marcello Moores  Register   On: 06/10/2016 08:37   Dg Abd 2 Views  Result Date: 06/08/2016 CLINICAL DATA:  Followup small bowel obstruction. EXAM: ABDOMEN - 2 VIEW COMPARISON:  06/07/2016 FINDINGS: Nasogastric tube remains in place, tip in the stomach in side hole above the diaphragm. There is less dilated small intestine but not complete resolution. There is gas within the colon. No sign of free air. IMPRESSION: Persistent but improved small bowel obstruction pattern. Electronically Signed   By: Nelson Chimes M.D.   On: 06/08/2016 10:28   Dg Abd 2 Views  Result Date: 06/07/2016 CLINICAL DATA:  Abdominal distention. Follow-up small bowel obstruction EXAM: ABDOMEN - 2 VIEW COMPARISON:  06/06/2016 FINDINGS: Increasing small bowel dilatation since prior study. Findings concerning for worsening small bowel obstruction. Gas also noted within the colon. No free air organomegaly. NG tube tip is in the proximal stomach with the side port in the distal esophagus. IMPRESSION: Increasing gaseous distention of small bowel loops with scattered air-fluid levels. Findings compatible with slight worsening in small bowel obstruction since prior study. Electronically Signed   By: Rolm Baptise M.D.   On: 06/07/2016 11:26   Dg Abd Acute W/chest  Result Date: 06/06/2016 CLINICAL DATA:  Nausea, vomiting, diarrhea, and all over abdominal pain for 5 days. Dizziness and lightheadedness. History of lung and brain cancer, last chemotherapy on Monday. Former smoker. EXAM: DG ABDOMEN ACUTE W/ 1V CHEST COMPARISON:  05/25/2016 FINDINGS: Power port type central venous catheter on the right with tip over the cavoatrial junction region. No pneumothorax. Linear scarring in the lung bases with low lung volumes. Calcified and  tortuous aorta. The gaseous distention of mid abdominal small bowel with air-fluid levels. Appearance suggests partial obstruction. Scattered stool seen in the colon. No colonic distention. No free intra-abdominal air. No radiopaque stones. Degenerative changes throughout the spine and in the hips and shoulders. IMPRESSION: Linear scarring in the lung bases similar to previous study. Gas-filled distended small bowel loops with air-fluid levels suggesting small bowel obstruction. No free air. Electronically Signed   By: Lucienne Capers M.D.   On: 06/06/2016 00:59   Dg Abd Acute W/chest  Result Date: 05/25/2016 CLINICAL DATA:  Nausea and vomiting for 2 weeks. History of multiple myeloma. EXAM: DG ABDOMEN ACUTE W/ 1V CHEST COMPARISON:  PA and lateral chest 05/09/2016 09/10/2015. CT chest, abdomen and pelvis 04/28/2016. FINDINGS: Single-view of the chest again shows a Port-A-Cath in place. Subsegmental atelectasis is present in the bases. No consolidative process, pneumothorax or effusion. Heart size is normal. Aortic atherosclerosis is noted. Two views of  the abdomen show no free intraperitoneal air. The bowel gas pattern is nonobstructive. Convex right lumbar scoliosis and multilevel spondylosis are identified. IMPRESSION: No acute abnormality chest or abdomen. Electronically Signed   By: Inge Rise M.D.   On: 05/25/2016 14:53    Microbiology: Recent Results (from the past 240 hour(s))  C difficile quick scan w PCR reflex     Status: None   Collection Time: 06/06/16  4:07 AM  Result Value Ref Range Status   C Diff antigen NEGATIVE NEGATIVE Final   C Diff toxin NEGATIVE NEGATIVE Final   C Diff interpretation No C. difficile detected.  Final  Gastrointestinal Panel by PCR , Stool     Status: None   Collection Time: 06/06/16  4:07 AM  Result Value Ref Range Status   Campylobacter species NOT DETECTED NOT DETECTED Final   Plesimonas shigelloides NOT DETECTED NOT DETECTED Final   Salmonella  species NOT DETECTED NOT DETECTED Final   Yersinia enterocolitica NOT DETECTED NOT DETECTED Final   Vibrio species NOT DETECTED NOT DETECTED Final   Vibrio cholerae NOT DETECTED NOT DETECTED Final   Enteroaggregative E coli (EAEC) NOT DETECTED NOT DETECTED Final   Enteropathogenic E coli (EPEC) NOT DETECTED NOT DETECTED Final   Enterotoxigenic E coli (ETEC) NOT DETECTED NOT DETECTED Final   Shiga like toxin producing E coli (STEC) NOT DETECTED NOT DETECTED Final   Shigella/Enteroinvasive E coli (EIEC) NOT DETECTED NOT DETECTED Final   Cryptosporidium NOT DETECTED NOT DETECTED Final   Cyclospora cayetanensis NOT DETECTED NOT DETECTED Final   Entamoeba histolytica NOT DETECTED NOT DETECTED Final   Giardia lamblia NOT DETECTED NOT DETECTED Final   Adenovirus F40/41 NOT DETECTED NOT DETECTED Final   Astrovirus NOT DETECTED NOT DETECTED Final   Norovirus GI/GII NOT DETECTED NOT DETECTED Final   Rotavirus A NOT DETECTED NOT DETECTED Final   Sapovirus (I, II, IV, and V) NOT DETECTED NOT DETECTED Final  Surgical PCR screen     Status: None   Collection Time: 06/09/16  7:54 AM  Result Value Ref Range Status   MRSA, PCR NEGATIVE NEGATIVE Final   Staphylococcus aureus NEGATIVE NEGATIVE Final    Comment:        The Xpert SA Assay (FDA approved for NASAL specimens in patients over 46 years of age), is one component of a comprehensive surveillance program.  Test performance has been validated by [facility] for patients greater than or equal to 56 year old. It is not intended to diagnose infection nor to guide or monitor treatment.   Culture, Urine     Status: None   Collection Time: 06/10/16 12:02 PM  Result Value Ref Range Status   Specimen Description URINE, CATHETERIZED  Final   Special Requests NONE  Final   Culture NO GROWTH Performed at [facility]   Final   Report Status 06/11/2016 FINAL  Final     Labs: Basic Metabolic Panel:  Recent Labs Lab 06/07/16 0418   06/09/16 0610 06/10/16 1202 06/11/16 0500 06/11/16 1440 06/11/16 2307 06/12/16 0454 06/13/16 0428  NA 154*  < > 136 125* 122* 126* 136 140 144  K 4.7  < > 3.6 3.4* 3.7  --   --  3.4* 4.1  CL 124*  < > 106 94* 94*  --   --  109 113*  CO2 25  < > 23 24 21*  --   --  24 26  GLUCOSE 138*  < > 141* 125* 94  --   --  114* 142*  BUN 14  < > _0 --   --  13 15  CREATININE 1.18  < > 0.92 0.67 0.66  --   --  0.94 0.91  CALCIUM 9.1  < > 7.5* 7.5* 7.2*  --   --  9.4 9.4  MG 2.4  --   --   --   --   --   --   --   --   < > = values in this interval not displayed.  CBC:  Recent Labs Lab 06/09/16 0610 06/10/16 0601 06/11/16 0500 06/12/16 0454 06/13/16 0428  WBC 6.4 4.9 5.4 6.4 9.6  HGB 10.5* 9.5* 9.3* 12.8* 12.9*  HCT 31.2* 26.9* 25.3* 35.5* 37.9*  MCV 96.0 92.4 87.8 89.9 95.5  PLT 94* 72* 72* 84* 68*    BNP (last 3 results)  Recent Labs  06/05/16 2354  BNP 45.8   CBG:  Recent Labs Lab 06/12/16 1153 06/12/16 1658 06/12/16 2150 06/13/16 0726 06/13/16 1157  GLUCAP 109* 174* 129* 129* 142*    Signed:  Barton Dubois MD.  [facility] 06/13/2016, 12:06 PM

## 2016-06-13 NOTE — Progress Notes (Signed)
Radiation Oncology         [phone] ________________________________  Name: [name] MRN: [id number]  Date: 06/13/2016  DOB: 10/10/1940  End of Treatment Note  Diagnosis:   Multiple myeloma not having achieved remission     Indication for treatment:  Palliative       Radiation treatment dates:   05/20/16-05/30/16  Site/dose:   1) Pituitary/ 20 Gy in 4 fx   2) Brain SRS PTV 1-8 / 20 Gy in 1 fx   3) Brain SRS PTV 9 / 5 Gy in 1 fx  Beams/energy:   1) SBRT-SRT 3D / 6FFF    2) SBRT-SRT 3D /6FFF    3) SBRT-SRT 3D /6FFF  Narrative: The patient tolerated radiation treatment relatively well.    Plan: The patient has completed radiation treatment. The patient will return to radiation oncology clinic for routine followup in one month. I advised them to call or return sooner if they have any questions or concerns related to their recovery or treatment.  ------------------------------------------------  [name], MD, PhD  This document serves as a record of services personally performed by Kyung Rudd, MD. It was created on his behalf by Bethann Humble, a trained medical scribe. The creation of this record is based on the scribe's personal observations and the provider's statements to them. This document has been checked and approved by the attending provider.

## 2016-06-13 NOTE — Telephone Encounter (Signed)
Been in hospital for week with bowel obstruction and now has to see Dr. Karsten Ro for catheter on Wednesday Originally was asking for a referral but I advised him that his insurance did not require, so he just wanted me to make you aware of appointment

## 2016-06-13 NOTE — Final Consult Note (Signed)
Assessment: Acute urinary retention - he developed urinary retention and may have been in retention for some time with greater than 1000 mL in his bladder. We discussed the fact that it may take some time for his bladder to recover. What I have recommended is that his Foley catheter remain indwelling upon discharge. He will be maintained on tamsulosin 0.8 mg and follow-up in 48 hours in my office for a voiding trial. We did discuss the fact that if he is unable to urinate the catheter would need to be replaced or he could be taught intermittent self-catheterization.  Plan: 1. Tamsulosin 0.8 mg daily (prescription written and on the chart.) 2. Continue Foley catheter drainage upon discharge. 3. Follow-up in my office on Wednesday for a voiding trial.    Subjective: Patient reports no new urologic complaints today.  Objective: Vital signs in last 24 hours: Temp:  [97.4 F (36.3 C)-98.3 F (36.8 C)] 97.4 F (36.3 C) (12/04 0508) Pulse Rate:  [86-96] 86 (12/04 0508) Resp:  [16] 16 (12/04 0508) BP: (104-126)/(60-85) 126/85 (12/04 0508) SpO2:  [95 %-97 %] 97 % (12/04 0508)A  Intake/Output from previous day: 12/03 0701 - 12/04 0700 In: 840 [P.O.:840] Out: 4825 [Urine:4825] Intake/Output this shift: No intake/output data recorded.  Past Medical History:  Diagnosis Date  . Adenocarcinoma of right lung, stage 4 ([place]) 05/27/2016  . Adenomatous polyp of colon 1996  . Arthritis    Osteoarthritis right knee, spine, wrist  . Arthritis of hand, right    Right thumb (Dr. Daylene Katayama)  . Back ache    r/o Multiple Myeloma  . Benign prostatic hypertrophy    (Dr. Karsten Ro in past)  . Degenerative disc disease   . Diverticulosis   . DVT (deep venous thrombosis) ([place]) 01/2014   R calf  . Encounter for antineoplastic chemotherapy 10/20/2015  . Erosive esophagitis   . GERD (gastroesophageal reflux disease)   . Hemorrhoids   . Hiatal hernia   . History of chronic prostatitis   . History of  melanoma    Back (Dr. Wilhemina Bonito); early melanoma R arm 03/2011  . Multiple myeloma ([place]) 2013   skull lytic lesions--treated with radiation 07/2014  . Pulmonary embolism ([place]) 01/2014  . Radiation 07/21/14-08/05/14   left occipital parietal skull 25 gray  . SCC (squamous cell carcinoma) 08/04/15   right ear;dysplastic nevus with severe atypia-chest    Physical Exam:  Lungs - Normal respiratory effort, chest expands symmetrically.  Abdomen - Soft, non-tender & non-distended. Foley catheter is indwelling and draining clear urine.  Lab Results:  Recent Labs  06/11/16 0500 06/12/16 0454 06/13/16 0428  WBC 5.4 6.4 9.6  HGB 9.3* 12.8* 12.9*  HCT 25.3* 35.5* 37.9*   BMET  Recent Labs  06/12/16 0454 06/13/16 0428  NA 140 144  K 3.4* 4.1  CL 109 113*  CO2 24 26  GLUCOSE 114* 142*  BUN 13 15  CREATININE 0.94 0.91  CALCIUM 9.4 9.4   No results for input(s): LABURIN in the last 72 hours. Results for orders placed or performed during the hospital encounter of 06/05/16  C difficile quick scan w PCR reflex     Status: None   Collection Time: 06/06/16  4:07 AM  Result Value Ref Range Status   C Diff antigen NEGATIVE NEGATIVE Final   C Diff toxin NEGATIVE NEGATIVE Final   C Diff interpretation No C. difficile detected.  Final  Gastrointestinal Panel by PCR , Stool     Status: None  Collection Time: 06/06/16  4:07 AM  Result Value Ref Range Status   Campylobacter species NOT DETECTED NOT DETECTED Final   Plesimonas shigelloides NOT DETECTED NOT DETECTED Final   Salmonella species NOT DETECTED NOT DETECTED Final   Yersinia enterocolitica NOT DETECTED NOT DETECTED Final   Vibrio species NOT DETECTED NOT DETECTED Final   Vibrio cholerae NOT DETECTED NOT DETECTED Final   Enteroaggregative E coli (EAEC) NOT DETECTED NOT DETECTED Final   Enteropathogenic E coli (EPEC) NOT DETECTED NOT DETECTED Final   Enterotoxigenic E coli (ETEC) NOT DETECTED NOT DETECTED Final   Shiga like toxin  producing E coli (STEC) NOT DETECTED NOT DETECTED Final   Shigella/Enteroinvasive E coli (EIEC) NOT DETECTED NOT DETECTED Final   Cryptosporidium NOT DETECTED NOT DETECTED Final   Cyclospora cayetanensis NOT DETECTED NOT DETECTED Final   Entamoeba histolytica NOT DETECTED NOT DETECTED Final   Giardia lamblia NOT DETECTED NOT DETECTED Final   Adenovirus F40/41 NOT DETECTED NOT DETECTED Final   Astrovirus NOT DETECTED NOT DETECTED Final   Norovirus GI/GII NOT DETECTED NOT DETECTED Final   Rotavirus A NOT DETECTED NOT DETECTED Final   Sapovirus (I, II, IV, and V) NOT DETECTED NOT DETECTED Final  Surgical PCR screen     Status: None   Collection Time: 06/09/16  7:54 AM  Result Value Ref Range Status   MRSA, PCR NEGATIVE NEGATIVE Final   Staphylococcus aureus NEGATIVE NEGATIVE Final    Comment:        The Xpert SA Assay (FDA approved for NASAL specimens in patients over 24 years of age), is one component of a comprehensive surveillance program.  Test performance has been validated by [facility] for patients greater than or equal to 27 year old. It is not intended to diagnose infection nor to guide or monitor treatment.   Culture, Urine     Status: None   Collection Time: 06/10/16 12:02 PM  Result Value Ref Range Status   Specimen Description URINE, CATHETERIZED  Final   Special Requests NONE  Final   Culture NO GROWTH Performed at [facility]   Final   Report Status 06/11/2016 FINAL  Final   *Note: Due to a large number of results and/or encounters for the requested time period, some results have not been displayed. A complete set of results can be found in Results Review.    Studies/Results: No results found.    Chadric Kimberley C 06/13/2016, 8:00 AM

## 2016-06-15 ENCOUNTER — Other Ambulatory Visit (HOSPITAL_BASED_OUTPATIENT_CLINIC_OR_DEPARTMENT_OTHER): Payer: Medicare Other

## 2016-06-15 ENCOUNTER — Ambulatory Visit (HOSPITAL_BASED_OUTPATIENT_CLINIC_OR_DEPARTMENT_OTHER): Payer: Medicare Other

## 2016-06-15 ENCOUNTER — Other Ambulatory Visit: Payer: Self-pay | Admitting: *Deleted

## 2016-06-15 VITALS — BP 111/98 | HR 92 | Temp 97.9°F | Resp 18

## 2016-06-15 DIAGNOSIS — Z79899 Other long term (current) drug therapy: Secondary | ICD-10-CM | POA: Diagnosis not present

## 2016-06-15 DIAGNOSIS — R338 Other retention of urine: Secondary | ICD-10-CM | POA: Diagnosis not present

## 2016-06-15 DIAGNOSIS — C3491 Malignant neoplasm of unspecified part of right bronchus or lung: Secondary | ICD-10-CM

## 2016-06-15 DIAGNOSIS — Z5112 Encounter for antineoplastic immunotherapy: Secondary | ICD-10-CM

## 2016-06-15 DIAGNOSIS — C9000 Multiple myeloma not having achieved remission: Secondary | ICD-10-CM

## 2016-06-15 LAB — CBC WITH DIFFERENTIAL/PLATELET
BASO%: 0.4 % (ref 0.0–2.0)
BASOS ABS: 0 10*3/uL (ref 0.0–0.1)
EOS%: 0.2 % (ref 0.0–7.0)
Eosinophils Absolute: 0 10*3/uL (ref 0.0–0.5)
HCT: 36.2 % — ABNORMAL LOW (ref 38.4–49.9)
HGB: 12.3 g/dL — ABNORMAL LOW (ref 13.0–17.1)
LYMPH#: 0.4 10*3/uL — AB (ref 0.9–3.3)
LYMPH%: 5.3 % — AB (ref 14.0–49.0)
MCH: 32.2 pg (ref 27.2–33.4)
MCHC: 34.1 g/dL (ref 32.0–36.0)
MCV: 94.5 fL (ref 79.3–98.0)
MONO#: 0.3 10*3/uL (ref 0.1–0.9)
MONO%: 3.6 % (ref 0.0–14.0)
NEUT#: 7.7 10*3/uL — ABNORMAL HIGH (ref 1.5–6.5)
NEUT%: 90.5 % — AB (ref 39.0–75.0)
Platelets: 68 10*3/uL — ABNORMAL LOW (ref 140–400)
RBC: 3.83 10*6/uL — AB (ref 4.20–5.82)
RDW: 17.4 % — ABNORMAL HIGH (ref 11.0–14.6)
WBC: 8.5 10*3/uL (ref 4.0–10.3)

## 2016-06-15 LAB — COMPREHENSIVE METABOLIC PANEL
ALT: 27 U/L (ref 0–55)
AST: 10 U/L (ref 5–34)
Albumin: 2.8 g/dL — ABNORMAL LOW (ref 3.5–5.0)
Alkaline Phosphatase: 77 U/L (ref 40–150)
Anion Gap: 11 mEq/L (ref 3–11)
BUN: 19 mg/dL (ref 7.0–26.0)
CALCIUM: 8.5 mg/dL (ref 8.4–10.4)
CHLORIDE: 98 meq/L (ref 98–109)
CO2: 23 meq/L (ref 22–29)
CREATININE: 0.8 mg/dL (ref 0.7–1.3)
EGFR: 85 mL/min/{1.73_m2} — ABNORMAL LOW (ref >90)
GLUCOSE: 101 mg/dL (ref 70–140)
POTASSIUM: 4.1 meq/L (ref 3.5–5.1)
SODIUM: 132 meq/L — AB (ref 136–145)
Total Bilirubin: 0.99 mg/dL (ref 0.20–1.20)
Total Protein: 5.6 g/dL — ABNORMAL LOW (ref 6.4–8.3)

## 2016-06-15 LAB — TSH: TSH: 1.1 m[IU]/L (ref 0.320–4.118)

## 2016-06-15 MED ORDER — PEMBROLIZUMAB CHEMO INJECTION 100 MG/4ML
200.0000 mg | Freq: Once | INTRAVENOUS | Status: AC
  Administered 2016-06-15: 200 mg via INTRAVENOUS
  Filled 2016-06-15: qty 8

## 2016-06-15 MED ORDER — SODIUM CHLORIDE 0.9% FLUSH
10.0000 mL | INTRAVENOUS | Status: DC | PRN
  Administered 2016-06-15: 10 mL
  Filled 2016-06-15: qty 10

## 2016-06-15 MED ORDER — ONDANSETRON HCL 4 MG/2ML IJ SOLN
8.0000 mg | Freq: Once | INTRAMUSCULAR | Status: AC
  Administered 2016-06-15: 8 mg via INTRAVENOUS

## 2016-06-15 MED ORDER — ZOLEDRONIC ACID 4 MG/5ML IV CONC: 4.0000 mg | Freq: Once | INTRAVENOUS | Status: DC

## 2016-06-15 MED ORDER — ONDANSETRON HCL 4 MG/2ML IJ SOLN
INTRAMUSCULAR | Status: AC
  Filled 2016-06-15: qty 4

## 2016-06-15 MED ORDER — SODIUM CHLORIDE 0.9 % IV SOLN
Freq: Once | INTRAVENOUS | Status: AC
  Administered 2016-06-15: 10:00:00 via INTRAVENOUS

## 2016-06-15 MED ORDER — HEPARIN SOD (PORK) LOCK FLUSH 100 UNIT/ML IV SOLN
500.0000 [IU] | Freq: Once | INTRAVENOUS | Status: AC | PRN
  Administered 2016-06-15: 500 [IU]
  Filled 2016-06-15: qty 5

## 2016-06-15 NOTE — Progress Notes (Signed)
Per Dr. [name], it's OK to treat with a platelet count of 68 today.

## 2016-06-15 NOTE — Patient Instructions (Signed)
[facility] Discharge Instructions for Patients Receiving Chemotherapy  Today you received the following chemotherapy agents:  Keytruda.  To help prevent nausea and vomiting after your treatment, we encourage you to take your nausea medication as prescribed.   If you develop nausea and vomiting that is not controlled by your nausea medication, call the clinic.   BELOW ARE SYMPTOMS THAT SHOULD BE REPORTED IMMEDIATELY:  *FEVER GREATER THAN 100.5 F  *CHILLS WITH OR WITHOUT FEVER  NAUSEA AND VOMITING THAT IS NOT CONTROLLED WITH YOUR NAUSEA MEDICATION  *UNUSUAL SHORTNESS OF BREATH  *UNUSUAL BRUISING OR BLEEDING  TENDERNESS IN MOUTH AND THROAT WITH OR WITHOUT PRESENCE OF ULCERS  *URINARY PROBLEMS  *BOWEL PROBLEMS  UNUSUAL RASH Items with * indicate a potential emergency and should be followed up as soon as possible.  Feel free to call the clinic you have any questions or concerns. The clinic phone number is [phone].  Please show the CHEMO ALERT CARD at check-in to the Emergency Department and triage nurse.   

## 2016-06-19 ENCOUNTER — Emergency Department (HOSPITAL_COMMUNITY): Payer: Medicare Other

## 2016-06-19 ENCOUNTER — Other Ambulatory Visit (HOSPITAL_COMMUNITY): Payer: Self-pay

## 2016-06-19 ENCOUNTER — Other Ambulatory Visit: Payer: Self-pay

## 2016-06-19 ENCOUNTER — Encounter (HOSPITAL_COMMUNITY): Payer: Self-pay | Admitting: *Deleted

## 2016-06-19 ENCOUNTER — Telehealth: Payer: Self-pay | Admitting: Family Medicine

## 2016-06-19 ENCOUNTER — Inpatient Hospital Stay (HOSPITAL_COMMUNITY)
Admission: EM | Admit: 2016-06-19 | Discharge: 2016-06-22 | DRG: 176 | Disposition: A | Discharge status: Home / Self Care | Payer: Medicare Other | Attending: Family Medicine | Admitting: Family Medicine

## 2016-06-19 DIAGNOSIS — K219 Gastro-esophageal reflux disease without esophagitis: Secondary | ICD-10-CM | POA: Diagnosis present

## 2016-06-19 DIAGNOSIS — M19039 Primary osteoarthritis, unspecified wrist: Secondary | ICD-10-CM | POA: Diagnosis not present

## 2016-06-19 DIAGNOSIS — I2699 Other pulmonary embolism without acute cor pulmonale: Principal | ICD-10-CM | POA: Diagnosis present

## 2016-06-19 DIAGNOSIS — C3411 Malignant neoplasm of upper lobe, right bronchus or lung: Secondary | ICD-10-CM | POA: Diagnosis not present

## 2016-06-19 DIAGNOSIS — Z85828 Personal history of other malignant neoplasm of skin: Secondary | ICD-10-CM

## 2016-06-19 DIAGNOSIS — Z79899 Other long term (current) drug therapy: Secondary | ICD-10-CM | POA: Diagnosis not present

## 2016-06-19 DIAGNOSIS — Z86718 Personal history of other venous thrombosis and embolism: Secondary | ICD-10-CM | POA: Diagnosis not present

## 2016-06-19 DIAGNOSIS — M1711 Unilateral primary osteoarthritis, right knee: Secondary | ICD-10-CM | POA: Diagnosis not present

## 2016-06-19 DIAGNOSIS — R609 Edema, unspecified: Secondary | ICD-10-CM | POA: Diagnosis not present

## 2016-06-19 DIAGNOSIS — Z9221 Personal history of antineoplastic chemotherapy: Secondary | ICD-10-CM | POA: Diagnosis not present

## 2016-06-19 DIAGNOSIS — E876 Hypokalemia: Secondary | ICD-10-CM | POA: Diagnosis not present

## 2016-06-19 DIAGNOSIS — Z888 Allergy status to other drugs, medicaments and biological substances status: Secondary | ICD-10-CM | POA: Diagnosis not present

## 2016-06-19 DIAGNOSIS — E440 Moderate protein-calorie malnutrition: Secondary | ICD-10-CM | POA: Diagnosis present

## 2016-06-19 DIAGNOSIS — Z7901 Long term (current) use of anticoagulants: Secondary | ICD-10-CM

## 2016-06-19 DIAGNOSIS — Z87891 Personal history of nicotine dependence: Secondary | ICD-10-CM | POA: Diagnosis not present

## 2016-06-19 DIAGNOSIS — C7931 Secondary malignant neoplasm of brain: Secondary | ICD-10-CM | POA: Diagnosis not present

## 2016-06-19 DIAGNOSIS — E871 Hypo-osmolality and hyponatremia: Secondary | ICD-10-CM | POA: Diagnosis present

## 2016-06-19 DIAGNOSIS — D696 Thrombocytopenia, unspecified: Secondary | ICD-10-CM | POA: Diagnosis present

## 2016-06-19 DIAGNOSIS — C3490 Malignant neoplasm of unspecified part of unspecified bronchus or lung: Secondary | ICD-10-CM | POA: Diagnosis not present

## 2016-06-19 DIAGNOSIS — E46 Unspecified protein-calorie malnutrition: Secondary | ICD-10-CM | POA: Diagnosis not present

## 2016-06-19 DIAGNOSIS — R079 Chest pain, unspecified: Secondary | ICD-10-CM | POA: Diagnosis not present

## 2016-06-19 DIAGNOSIS — Z8579 Personal history of other malignant neoplasms of lymphoid, hematopoietic and related tissues: Secondary | ICD-10-CM

## 2016-06-19 DIAGNOSIS — Z96651 Presence of right artificial knee joint: Secondary | ICD-10-CM | POA: Diagnosis not present

## 2016-06-19 DIAGNOSIS — R0602 Shortness of breath: Secondary | ICD-10-CM | POA: Diagnosis not present

## 2016-06-19 DIAGNOSIS — Z880 Allergy status to penicillin: Secondary | ICD-10-CM

## 2016-06-19 DIAGNOSIS — I825Z9 Chronic embolism and thrombosis of unspecified deep veins of unspecified distal lower extremity: Secondary | ICD-10-CM | POA: Diagnosis not present

## 2016-06-19 DIAGNOSIS — M479 Spondylosis, unspecified: Secondary | ICD-10-CM | POA: Diagnosis present

## 2016-06-19 DIAGNOSIS — M19041 Primary osteoarthritis, right hand: Secondary | ICD-10-CM | POA: Diagnosis not present

## 2016-06-19 DIAGNOSIS — C9000 Multiple myeloma not having achieved remission: Secondary | ICD-10-CM | POA: Diagnosis not present

## 2016-06-19 DIAGNOSIS — T17908D Unspecified foreign body in respiratory tract, part unspecified causing other injury, subsequent encounter: Secondary | ICD-10-CM | POA: Diagnosis not present

## 2016-06-19 DIAGNOSIS — K56609 Unspecified intestinal obstruction, unspecified as to partial versus complete obstruction: Secondary | ICD-10-CM | POA: Diagnosis not present

## 2016-06-19 LAB — COMPREHENSIVE METABOLIC PANEL
ALBUMIN: 2.6 g/dL — AB (ref 3.5–5.0)
ALK PHOS: 65 U/L (ref 38–126)
ALT: 21 U/L (ref 17–63)
ALT: 21 U/L (ref 17–63)
ANION GAP: 7 (ref 5–15)
AST: 15 U/L (ref 15–41)
AST: 17 U/L (ref 15–41)
Albumin: 2.7 g/dL — ABNORMAL LOW (ref 3.5–5.0)
Alkaline Phosphatase: 70 U/L (ref 38–126)
Anion gap: 6 (ref 5–15)
BUN: 9 mg/dL (ref 6–20)
BUN: 9 mg/dL (ref 6–20)
CALCIUM: 7.9 mg/dL — AB (ref 8.9–10.3)
CHLORIDE: 97 mmol/L — AB (ref 101–111)
CO2: 26 mmol/L (ref 22–32)
CO2: 26 mmol/L (ref 22–32)
CREATININE: 0.82 mg/dL (ref 0.61–1.24)
CREATININE: 0.82 mg/dL (ref 0.61–1.24)
Calcium: 8 mg/dL — ABNORMAL LOW (ref 8.9–10.3)
Chloride: 93 mmol/L — ABNORMAL LOW (ref 101–111)
GFR calc Af Amer: >60 mL/min (ref >60)
GFR calc Af Amer: >60 mL/min (ref >60)
GFR calc non Af Amer: >60 mL/min (ref >60)
GFR calc non Af Amer: >60 mL/min (ref >60)
GLUCOSE: 93 mg/dL (ref 65–99)
Glucose, Bld: 145 mg/dL — ABNORMAL HIGH (ref 65–99)
Potassium: 3.3 mmol/L — ABNORMAL LOW (ref 3.5–5.1)
Potassium: 3.5 mmol/L (ref 3.5–5.1)
SODIUM: 126 mmol/L — AB (ref 135–145)
SODIUM: 129 mmol/L — AB (ref 135–145)
Total Bilirubin: 0.9 mg/dL (ref 0.3–1.2)
Total Bilirubin: 1 mg/dL (ref 0.3–1.2)
Total Protein: 5.2 g/dL — ABNORMAL LOW (ref 6.5–8.1)
Total Protein: 5.4 g/dL — ABNORMAL LOW (ref 6.5–8.1)

## 2016-06-19 LAB — I-STAT TROPONIN, ED: Troponin i, poc: 0 ng/mL (ref 0.00–0.08)

## 2016-06-19 LAB — CBC WITH DIFFERENTIAL/PLATELET
Basophils Absolute: 0 10*3/uL (ref 0.0–0.1)
Basophils Relative: 0 %
EOS ABS: 0 10*3/uL (ref 0.0–0.7)
Eosinophils Relative: 0 %
HCT: 27.9 % — ABNORMAL LOW (ref 39.0–52.0)
HEMOGLOBIN: 10.1 g/dL — AB (ref 13.0–17.0)
Lymphocytes Relative: 2 %
Lymphs Abs: 0.1 10*3/uL — ABNORMAL LOW (ref 0.7–4.0)
MCH: 32.4 pg (ref 26.0–34.0)
MCHC: 36.2 g/dL — ABNORMAL HIGH (ref 30.0–36.0)
MCV: 89.4 fL (ref 78.0–100.0)
Monocytes Absolute: 0.2 10*3/uL (ref 0.1–1.0)
Monocytes Relative: 4 %
NEUTROS PCT: 93 %
Neutro Abs: 5.3 10*3/uL (ref 1.7–7.7)
Platelets: 75 10*3/uL — ABNORMAL LOW (ref 150–400)
RBC: 3.12 MIL/uL — AB (ref 4.22–5.81)
RDW: 16 % — ABNORMAL HIGH (ref 11.5–15.5)
WBC: 5.7 10*3/uL (ref 4.0–10.5)

## 2016-06-19 LAB — DIC (DISSEMINATED INTRAVASCULAR COAGULATION)PANEL
D-Dimer, Quant: 8.08 ug/mL-FEU — ABNORMAL HIGH (ref 0.00–0.50)
Fibrinogen: 524 mg/dL — ABNORMAL HIGH (ref 210–475)
Prothrombin Time: 16.3 seconds — ABNORMAL HIGH (ref 11.4–15.2)
aPTT: 34 seconds (ref 24–36)

## 2016-06-19 LAB — URINALYSIS, ROUTINE W REFLEX MICROSCOPIC
BILIRUBIN URINE: NEGATIVE
GLUCOSE, UA: NEGATIVE mg/dL
HGB URINE DIPSTICK: NEGATIVE
Ketones, ur: NEGATIVE mg/dL
LEUKOCYTES UA: NEGATIVE
NITRITE: POSITIVE — AB
PH: 6 (ref 5.0–8.0)
Protein, ur: NEGATIVE mg/dL
SQUAMOUS EPITHELIAL / LPF: NONE SEEN
Specific Gravity, Urine: 1.016 (ref 1.005–1.030)

## 2016-06-19 LAB — DIC (DISSEMINATED INTRAVASCULAR COAGULATION) PANEL
INR: 1.31
PLATELETS: 79 10*3/uL — AB (ref 150–400)
SMEAR REVIEW: NONE SEEN

## 2016-06-19 LAB — BRAIN NATRIURETIC PEPTIDE: B NATRIURETIC PEPTIDE 5: 33.6 pg/mL (ref 0.0–100.0)

## 2016-06-19 LAB — MRSA PCR SCREENING: MRSA by PCR: NEGATIVE

## 2016-06-19 LAB — LACTATE DEHYDROGENASE: LDH: 190 U/L (ref 98–192)

## 2016-06-19 MED ORDER — HYDROMORPHONE HCL 1 MG/ML IJ SOLN
1.0000 mg | INTRAMUSCULAR | Status: DC | PRN
  Administered 2016-06-19 – 2016-06-20 (×5): 1 mg via INTRAVENOUS
  Filled 2016-06-19 (×5): qty 1

## 2016-06-19 MED ORDER — ACETAMINOPHEN 500 MG PO TABS: 500.0000 mg | ORAL_TABLET | Freq: Four times a day (QID) | ORAL | Status: DC | PRN

## 2016-06-19 MED ORDER — MORPHINE SULFATE (PF) 4 MG/ML IV SOLN
4.0000 mg | Freq: Once | INTRAVENOUS | Status: AC
  Administered 2016-06-19: 4 mg via INTRAVENOUS
  Filled 2016-06-19: qty 1

## 2016-06-19 MED ORDER — POLYETHYLENE GLYCOL 3350 17 G PO PACK: 17.0000 g | PACK | Freq: Every day | ORAL | Status: DC | PRN

## 2016-06-19 MED ORDER — DEXAMETHASONE 2 MG PO TABS
2.0000 mg | ORAL_TABLET | Freq: Every day | ORAL | Status: DC
  Administered 2016-06-20 – 2016-06-22 (×3): 2 mg via ORAL
  Filled 2016-06-19 (×3): qty 1

## 2016-06-19 MED ORDER — ONDANSETRON HCL 4 MG PO TABS: 4.0000 mg | ORAL_TABLET | Freq: Four times a day (QID) | ORAL | Status: DC | PRN

## 2016-06-19 MED ORDER — ONDANSETRON HCL 4 MG/2ML IJ SOLN
4.0000 mg | Freq: Once | INTRAMUSCULAR | Status: AC
  Administered 2016-06-19: 4 mg via INTRAVENOUS
  Filled 2016-06-19: qty 2

## 2016-06-19 MED ORDER — TEMAZEPAM 15 MG PO CAPS
15.0000 mg | ORAL_CAPSULE | Freq: Every evening | ORAL | Status: DC | PRN
  Administered 2016-06-19 – 2016-06-21 (×3): 15 mg via ORAL
  Filled 2016-06-19 (×3): qty 1

## 2016-06-19 MED ORDER — SODIUM CHLORIDE 0.9 % IV BOLUS (SEPSIS): 1000.0000 mL | Freq: Once | INTRAVENOUS | Status: DC

## 2016-06-19 MED ORDER — VITAMIN D3 25 MCG (1000 UNIT) PO TABS
1000.0000 [IU] | ORAL_TABLET | Freq: Every day | ORAL | Status: DC
  Administered 2016-06-20 – 2016-06-22 (×3): 1000 [IU] via ORAL
  Filled 2016-06-19 (×3): qty 1

## 2016-06-19 MED ORDER — ADULT MULTIVITAMIN W/MINERALS CH
1.0000 | ORAL_TABLET | Freq: Every day | ORAL | Status: DC
  Administered 2016-06-20 – 2016-06-22 (×3): 1 via ORAL
  Filled 2016-06-19 (×3): qty 1

## 2016-06-19 MED ORDER — SODIUM CHLORIDE 0.9 % IV BOLUS (SEPSIS)
1000.0000 mL | Freq: Once | INTRAVENOUS | Status: AC
  Administered 2016-06-19: 1000 mL via INTRAVENOUS

## 2016-06-19 MED ORDER — OXYCODONE HCL 5 MG PO TABS
5.0000 mg | ORAL_TABLET | ORAL | Status: DC | PRN
  Administered 2016-06-19 – 2016-06-22 (×8): 5 mg via ORAL
  Filled 2016-06-19 (×8): qty 1

## 2016-06-19 MED ORDER — VITAMIN C 500 MG PO TABS
500.0000 mg | ORAL_TABLET | Freq: Two times a day (BID) | ORAL | Status: DC
Start: 2016-06-19 — End: 2016-06-22
  Administered 2016-06-19 – 2016-06-22 (×6): 500 mg via ORAL
  Filled 2016-06-19 (×6): qty 1

## 2016-06-19 MED ORDER — ONDANSETRON HCL 4 MG/2ML IJ SOLN: 4.0000 mg | Freq: Four times a day (QID) | INTRAMUSCULAR | Status: DC | PRN

## 2016-06-19 MED ORDER — DESMOPRESSIN ACETATE 0.1 MG PO TABS
0.1000 mg | ORAL_TABLET | Freq: Every day | ORAL | Status: DC
  Administered 2016-06-19 – 2016-06-21 (×3): 0.1 mg via ORAL
  Filled 2016-06-19 (×3): qty 1

## 2016-06-19 MED ORDER — SODIUM CHLORIDE 0.9 % IJ SOLN
INTRAMUSCULAR | Status: AC
  Filled 2016-06-19: qty 50

## 2016-06-19 MED ORDER — ORAL CARE MOUTH RINSE
15.0000 mL | Freq: Two times a day (BID) | OROMUCOSAL | Status: DC
  Administered 2016-06-21: 15 mL via OROMUCOSAL

## 2016-06-19 MED ORDER — PANTOPRAZOLE SODIUM 40 MG PO TBEC
40.0000 mg | DELAYED_RELEASE_TABLET | Freq: Two times a day (BID) | ORAL | Status: DC
  Administered 2016-06-19 – 2016-06-22 (×6): 40 mg via ORAL
  Filled 2016-06-19 (×6): qty 1

## 2016-06-19 MED ORDER — LORAZEPAM 1 MG PO TABS: 1.0000 mg | ORAL_TABLET | Freq: Four times a day (QID) | ORAL | Status: DC | PRN

## 2016-06-19 MED ORDER — RISAQUAD PO CAPS
1.0000 | ORAL_CAPSULE | Freq: Every day | ORAL | Status: DC
  Administered 2016-06-20 – 2016-06-22 (×3): 1 via ORAL
  Filled 2016-06-19 (×3): qty 1

## 2016-06-19 MED ORDER — TAMSULOSIN HCL 0.4 MG PO CAPS
0.8000 mg | ORAL_CAPSULE | ORAL | Status: DC
  Administered 2016-06-19 – 2016-06-21 (×3): 0.8 mg via ORAL
  Filled 2016-06-19 (×3): qty 2

## 2016-06-19 MED ORDER — IOPAMIDOL (ISOVUE-370) INJECTION 76%
INTRAVENOUS | Status: AC
  Administered 2016-06-19: 62 mL
  Filled 2016-06-19: qty 100

## 2016-06-19 MED ORDER — ACETAMINOPHEN 650 MG RE SUPP: 650.0000 mg | Freq: Four times a day (QID) | RECTAL | Status: DC | PRN

## 2016-06-19 MED ORDER — OMEGA-3-ACID ETHYL ESTERS 1 G PO CAPS
1.0000 g | ORAL_CAPSULE | Freq: Every day | ORAL | Status: DC
  Administered 2016-06-20 – 2016-06-21 (×2): 1 g via ORAL
  Filled 2016-06-19 (×4): qty 1

## 2016-06-19 MED ORDER — ACETAMINOPHEN 325 MG PO TABS: 650.0000 mg | ORAL_TABLET | Freq: Four times a day (QID) | ORAL | Status: DC | PRN

## 2016-06-19 MED ORDER — METHOCARBAMOL 500 MG PO TABS: 500.0000 mg | ORAL_TABLET | Freq: Four times a day (QID) | ORAL | Status: DC | PRN

## 2016-06-19 MED ORDER — HYDROMORPHONE HCL 1 MG/ML IJ SOLN
1.0000 mg | Freq: Once | INTRAMUSCULAR | Status: AC
  Administered 2016-06-19: 1 mg via INTRAVENOUS
  Filled 2016-06-19: qty 1

## 2016-06-19 NOTE — Telephone Encounter (Signed)
Patient is complaining of right sided chest pain near his port since his Keytruda infusion on 12/6.  He is now developing shortness of breath. He texted me (I live near him) to see if I could come over. Eval around 9:55 found him on the phone with oncologist office.  He was able to speak in full sentences.  Had increased pain with deep breaths and movements.  He had normal respiratory rate and didn't appear to be in acute distress. He did appear moderately uncomfortable.  He had good air movement throughout his lungs without rales, ronchi. Port site was without erythema, but was very tender. He was planning to go to the beach tomorrow. Agree with oncology's recommendation of ER evaluation.  I spoke with nurses at [facility] ED to let them know he was coming

## 2016-06-19 NOTE — ED Provider Notes (Addendum)
[facility] DEPT Provider Note   CSN: [id number] Arrival date & time: 06/19/16  1036     History   Chief Complaint Chief Complaint  Patient presents with  . Shortness of Breath  . Dizziness    HPI CHORD TAKAHASHI is a 75 y.o. male.  HPI 75 year old male with history of adenocarcinoma of the right lung, currently on chemotherapy, as well as history of DVT and PE who presents with right chest pain. The patient was recently hospitalized for small bowel obstruction. During that admission, he was taken off of his Xarelto and placed on a heparin drip. He did not require surgery and was subsequently transitioned back to his Xarelto. Since then, the patient has had progressively worsening sharp, pleuritic, right-sided chest pain. The pain is worse with palpation and movement but particularly worse with deep inspiration. He has had associated shortness of breath. He has not had any diarrhea. Denies any fevers. Symptoms do feel similar to his previous PE.  Past Medical History:  Diagnosis Date  . Adenocarcinoma of right lung, stage 4 ([place]) 05/27/2016  . Adenomatous polyp of colon 1996  . Arthritis    Osteoarthritis right knee, spine, wrist  . Arthritis of hand, right    Right thumb (Dr. Daylene Katayama)  . Back ache    r/o Multiple Myeloma  . Benign prostatic hypertrophy    (Dr. Karsten Ro in past)  . Degenerative disc disease   . Diverticulosis   . DVT (deep venous thrombosis) ([place]) 01/2014   R calf  . Encounter for antineoplastic chemotherapy 10/20/2015  . Erosive esophagitis   . GERD (gastroesophageal reflux disease)   . Hemorrhoids   . Hiatal hernia   . History of chronic prostatitis   . History of melanoma    Back (Dr. Wilhemina Bonito); early melanoma R arm 03/2011  . Multiple myeloma ([place]) 2013   skull lytic lesions--treated with radiation 07/2014  . Pulmonary embolism ([place]) 01/2014  . Radiation 07/21/14-08/05/14   left occipital parietal skull 25 gray  . SCC (squamous cell carcinoma)  08/04/15   right ear;dysplastic nevus with severe atypia-chest    Patient Active Problem List   Diagnosis Date Noted  . Malignant neoplasm of upper lobe of right lung ([place])   . Pulmonary emboli ([place]) 06/19/2016  . Acute pulmonary embolism ([place]) 06/19/2016  . Abnormal CT scan, small bowel   . Encephalopathy, metabolic   . Thrombocytopenia ([place])   . Diabetes insipidus secondary to vasopressin deficiency ([place])   . Hypernatremia   . SBO (small bowel obstruction) 06/06/2016  . Hypokalemia 06/06/2016  . Adenocarcinoma of right lung, stage 4 ([place]) 05/27/2016  . Nausea with vomiting 05/18/2016  . Dehydration 05/18/2016  . Brain metastases ([place]) 05/04/2016  . Mediastinal adenopathy 05/04/2016  . Peripheral neuropathy due to chemotherapy ([place]) 02/08/2016  . Long term current use of anticoagulant therapy 11/04/2014  . Peripheral edema 11/04/2014  . Knee pain, bilateral 11/04/2014  . Headache 07/14/2014  . Abdominal aortic atherosclerosis ([place]) 04/28/2014  . History of pulmonary embolism 02/07/2014  . Acute deep vein thrombosis (DVT) of tibial vein of right lower extremity ([place]) 02/07/2014  . DVT of leg (deep venous thrombosis) ([place]) 02/06/2014  . Multiple myeloma ([place]) 01/27/2012  . Hyponatremia 01/16/2012  . Anemia 01/16/2012  . Dyspnea 04/18/2011  . Preop cardiovascular exam 04/18/2011  . ABDOMINAL PAIN-RUQ 04/29/2010  . IRRITABLE BOWEL SYNDROME 02/19/2009  . GERD 01/03/2008  . PERSONAL HX COLONIC POLYPS 01/03/2008    Past Surgical History:  Procedure Laterality  Date  . APPENDECTOMY    . BONE MARROW TRANSPLANT  03/2013   Stem Cell  . COLONOSCOPY  2009   adenomatous polyp  . ESOPHAGOGASTRODUODENOSCOPY  2009   mild inflammation at esophagogastric junction  . excision of melanoma     back ( x3)  . Great toe surgery     bilateral  . INGUINAL HERNIA REPAIR     left, x 2  . port placemet  10/2012  . SCALENE NODE BIOPSY Right 05/11/2016   Procedure: BIOPSY RIGHT SCALENE NODE;   Surgeon: Grace Isaac, MD;  Location: [facility];  Service: Thoracic;  Laterality: Right;  . TOTAL KNEE ARTHROPLASTY  06/08/2011   Procedure: TOTAL KNEE ARTHROPLASTY;  Surgeon: Ninetta Lights, MD;  Location: [facility];  Service: Orthopedics;  Laterality: Right;  osteonics  . total knee replaced  2000   Left knee       Home Medications    Prior to Admission medications   Medication Sig Start Date End Date Taking? Authorizing Provider  acetaminophen (TYLENOL) 500 MG tablet Take 500 mg by mouth every 6 (six) hours as needed for moderate pain or headache.   Yes Historical Provider, MD  ascorbic acid (VITAMIN C) 500 MG tablet Take 500 mg by mouth 2 (two) times daily.    Yes Historical Provider, MD  cholecalciferol (VITAMIN D) 1000 UNITS tablet Take 1,000 Units by mouth daily.   Yes Historical Provider, MD  desmopressin (DDAVP) 0.1 MG tablet Take 1 tablet (0.1 mg total) by mouth at bedtime. 04/25/16  Yes Rita Ohara, MD  dexamethasone (DECADRON) 4 MG tablet Take 1 tablet (4 mg total) by mouth 2 (two) times daily. Patient taking differently: Take 2 mg by mouth daily.  06/13/16  Yes Barton Dubois, MD  fish oil-omega-3 fatty acids 1000 MG capsule Take 1 g by mouth daily.    Yes Historical Provider, MD  furosemide (LASIX) 20 MG tablet Take 1 tablet (20 mg total) by mouth daily. Patient taking differently: Take 20 mg by mouth daily as needed for fluid.  02/29/16  Yes Rita Ohara, MD  LORazepam (ATIVAN) 0.5 MG tablet Take 1 tab PO Q 6 hours PRN nausea or anxiety 05/18/16  Yes Susanne Borders, NP  methocarbamol (ROBAXIN) 500 MG tablet TAKE 1 TO 2 TABLETS BY MOUTH EVERY 6 HOURS AS NEEDED FOR MUSCLE SPASMS 12/09/15  Yes Rita Ohara, MD  Multiple Vitamin (MULTIVITAMIN WITH MINERALS) TABS Take 1 tablet by mouth daily.   Yes Historical Provider, MD  ondansetron (ZOFRAN) 8 MG tablet Take 1 tablet (8 mg total) by mouth every 8 (eight) hours as needed for nausea or vomiting. 05/18/16  Yes Susanne Borders, NP  oxyCODONE  (ROXICODONE) 5 MG immediate release tablet Take 1-2 tabs PO Q 6 hours PRN pain. 05/18/16  Yes Susanne Borders, NP  pantoprazole (PROTONIX) 40 MG tablet TAKE 1 TABLET BY MOUTH TWO  TIMES DAILY 05/21/16  Yes Curt Bears, MD  polyethylene glycol [facility]) packet Take 17 g by mouth daily as needed for mild constipation. 06/13/16  Yes Barton Dubois, MD  Probiotic Product (ALIGN) 4 MG CAPS Take 1 capsule by mouth daily. Reported on 09/10/2015   Yes Historical Provider, MD  prochlorperazine (COMPAZINE) 10 MG tablet Take 1 tablet (10 mg total) by mouth every 6 (six) hours as needed. Patient taking differently: Take 10 mg by mouth every 6 (six) hours as needed for nausea or vomiting.  04/29/16  Yes Curt Bears, MD  tamsulosin [facility]) 0.4  MG CAPS capsule Take 2 capsules (0.8 mg total) by mouth daily after supper. 06/13/16  Yes Kathie Rhodes, MD  temazepam (RESTORIL) 15 MG capsule Take 1 capsule (15 mg total) by mouth at bedtime as needed. Patient taking differently: Take 15 mg by mouth at bedtime as needed for sleep.  04/08/16  Yes Curt Bears, MD  XARELTO 20 MG TABS tablet TAKE 1 TABLET BY MOUTH  DAILY 05/21/16  Yes Curt Bears, MD  zolendronic acid (ZOMETA) 4 MG/5ML injection Inject 4 mg into the vein every 3 (three) months.    Yes Historical Provider, MD    Family History Family History  Problem Relation Age of Onset  . Dementia Mother   . Stroke Father     brainstem stroke  . Hypertension Father   . Diabetes Father   . Prostate cancer Maternal Grandfather   . Depression Daughter   . Anesthesia problems Neg Hx     Social History Social History  Substance Use Topics  . Smoking status: Former Smoker    Packs/day: 3.00    Years: 10.00    Types: Cigarettes    Quit date: 07/11/1976  . Smokeless tobacco: Never Used     Comment: 3 PPD x 6 years  . Alcohol use 1.5 oz/week    3 Standard drinks or equivalent per week     Comment: 2 glasses of wine daily     Allergies   Tramadol;  Amoxicillin; and Penicillins   Review of Systems Review of Systems  Constitutional: Positive for fatigue. Negative for chills and fever.  HENT: Negative for congestion and rhinorrhea.   Eyes: Negative for visual disturbance.  Respiratory: Positive for chest tightness and shortness of breath. Negative for cough and wheezing.   Cardiovascular: Positive for chest pain. Negative for leg swelling.  Gastrointestinal: Negative for abdominal pain, diarrhea, nausea and vomiting.  Genitourinary: Negative for dysuria and flank pain.  Musculoskeletal: Negative for neck pain and neck stiffness.  Skin: Negative for rash and wound.  Allergic/Immunologic: Negative for immunocompromised state.  Neurological: Negative for syncope, weakness and headaches.  All other systems reviewed and are negative.    Physical Exam Updated Vital Signs BP 104/64   Pulse (!) 102   Temp 98.1 F (36.7 C) (Oral)   Resp 19   Ht _0  (1.753 m)   Wt 193 lb 12.6 oz (87.9 kg)   SpO2 92%   BMI 28.62 kg/m   Physical Exam  Constitutional: He is oriented to person, place, and time. He appears well-developed and well-nourished. No distress.  HENT:  Head: Normocephalic and atraumatic.  Eyes: Conjunctivae are normal.  Neck: Neck supple.  Cardiovascular: Normal rate, regular rhythm and normal heart sounds.  Exam reveals no friction rub.   No murmur heard. Pulmonary/Chest: Breath sounds normal. No respiratory distress. He has no wheezes. He has no rales.  Moderate tachypnea  Abdominal: He exhibits no distension.  Musculoskeletal: He exhibits no edema.  Neurological: He is alert and oriented to person, place, and time. He exhibits normal muscle tone.  Skin: Skin is warm. Capillary refill takes less than 2 seconds.  Psychiatric: He has a normal mood and affect.  Nursing note and vitals reviewed.    ED Treatments / Results  Labs (all labs ordered are listed, but only abnormal results are displayed) Labs Reviewed    CBC WITH DIFFERENTIAL/PLATELET - Abnormal; Notable for the following:       Result Value   RBC 3.12 (*)    Hemoglobin  10.1 (*)    HCT 27.9 (*)    MCHC 36.2 (*)    RDW 16.0 (*)    Platelets 75 (*)    Lymphs Abs 0.1 (*)    All other components within normal limits  COMPREHENSIVE METABOLIC PANEL - Abnormal; Notable for the following:    Sodium 126 (*)    Potassium 3.3 (*)    Chloride 93 (*)    Calcium 7.9 (*)    Total Protein 5.2 (*)    Albumin 2.6 (*)    All other components within normal limits  URINALYSIS, ROUTINE W REFLEX MICROSCOPIC - Abnormal; Notable for the following:    APPearance HAZY (*)    Nitrite POSITIVE (*)    Bacteria, UA RARE (*)    All other components within normal limits  DIC (DISSEMINATED INTRAVASCULAR COAGULATION) PANEL - Abnormal; Notable for the following:    Prothrombin Time 16.3 (*)    Fibrinogen 524 (*)    D-Dimer, Quant 8.08 (*)    Platelets 79 (*)    All other components within normal limits  COMPREHENSIVE METABOLIC PANEL - Abnormal; Notable for the following:    Sodium 129 (*)    Chloride 97 (*)    Glucose, Bld 145 (*)    Calcium 8.0 (*)    Total Protein 5.4 (*)    Albumin 2.7 (*)    All other components within normal limits  BASIC METABOLIC PANEL - Abnormal; Notable for the following:    Sodium 130 (*)    Potassium 3.4 (*)    Chloride 98 (*)    Glucose, Bld 136 (*)    Calcium 8.0 (*)    All other components within normal limits  CBC - Abnormal; Notable for the following:    RBC 3.00 (*)    Hemoglobin 9.8 (*)    HCT 28.1 (*)    RDW 16.6 (*)    Platelets 83 (*)    All other components within normal limits  MRSA PCR SCREENING  BRAIN NATRIURETIC PEPTIDE  LACTATE DEHYDROGENASE  APTT  PROTIME-INR  HEPARIN INDUCED PLATELET AB (HIT ANTIBODY)  SEROTONIN RELEASE ASSAY (SRA)  HAPTOGLOBIN  APTT  I-STAT TROPOININ, ED    EKG NOTE: Difficulty pulling EKG into muse. NSR, VR 99. Normal intervals. No new ST changes. No TWI in anterior leads  or signs of right heart strain. No STEMI.  Radiology Dg Chest 2 View  Result Date: 06/19/2016 CLINICAL DATA:  Shortness of breath. Right chest pain at the Port-A-Cath site. Lung cancer on immunotherapy. EXAM: CHEST  2 VIEW COMPARISON:  06/06/2016 chest radiograph. FINDINGS: Right internal jugular Port-A-Cath terminates at the cavoatrial junction. Stable cardiomediastinal silhouette with normal heart size. No pneumothorax. No pleural effusion. There is new patchy consolidation in the anterior right mid lung. Stable hazy bibasilar lung opacities. IMPRESSION: 1. New patchy anterior right midlung consolidation, differential includes infection or drug reaction. 2. Stable mild bibasilar hazy opacities, favor scarring or atelectasis. Electronically Signed   By: Ilona Sorrel M.D.   On: 06/19/2016 12:56   Ct Angio Chest Pe W And/or Wo Contrast  Result Date: 06/19/2016 CLINICAL DATA:  75 year old male with stage IV non-small-cell lung carcinoma diagnosed 05/11/2016, with ongoing immunotherapy, presenting with chest pain and dyspnea. Additional history of multiple myeloma and bone marrow transplant in 2014. EXAM: CT ANGIOGRAPHY CHEST WITH CONTRAST TECHNIQUE: Multidetector CT imaging of the chest was performed using the standard protocol during bolus administration of intravenous contrast. Multiplanar CT image reconstructions and MIPs were obtained  to evaluate the vascular anatomy. CONTRAST:  62 cc Isovue 370 IV. COMPARISON:  Chest radiograph from earlier today. 05/05/2016 PET-CT. 04/28/2016 CT chest, abdomen and pelvis. FINDINGS: Cardiovascular: The study is high quality for the evaluation of pulmonary embolism. There are multiple acute segmental and subsegmental pulmonary emboli in the right upper lobe and right lower lobe. Probable subsegmental acute pulmonary embolus in the left lower lobe. No central pulmonary emboli . Atherosclerotic nonaneurysmal thoracic aorta. Main pulmonary artery diameter 3.4 cm, stable  from 04/28/2016, mildly dilated. Normal heart size. No significant pericardial fluid/thickening. Left anterior descending coronary atherosclerosis. Right internal jugular MediPort terminates at the cavoatrial junction. Mediastinum/Nodes: Hypodense 1.0 cm left thyroid lobe nodule, not appreciably changed. Unremarkable esophagus. No axillary adenopathy. Right paratracheal adenopathy measuring up to 1.5 cm (series 4/ image 31), slightly increased from 1.2 cm on 04/28/2016. Subcarinal enlarged 1.2 cm node (series 4/ image 41), stable. Mildly enlarged 1.0 cm left paratracheal node (series 4/ image 33), stable. Right hilar adenopathy measuring up to 1.7 cm (series 4/ image 40), stable. Left hilar adenopathy measuring up to 1.2 cm (series 4/ image 43), previously 0.9 cm, slightly increased . Lungs/Pleura: No pneumothorax. Small right pleural effusion. No left pleural effusion. Central right upper lobe irregular 1.5 x 1.2 cm pulmonary nodule (series 11/ image 37), previously 1.5 x 1.1 cm on 04/28/2016, stable. New 3 mm solid right middle lobe pulmonary nodule (series 11/ image 52). New patchy consolidation and ground-glass attenuation throughout the basilar right upper lobe. New mild patchy peripheral consolidation and ground-glass attenuation in the left upper lobe and bilateral lower lobes. Mild compressive atelectasis in the dependent right lower lobe. Upper abdomen: Unremarkable. Musculoskeletal: No aggressive appearing focal osseous lesions. Marked thoracic spondylosis. Review of the MIP images confirms the above findings. IMPRESSION: 1. Acute segmental and subsegmental pulmonary embolism asymmetrically involving the right lung. 2. Stable chronic main pulmonary artery dilation, suggesting chronic pulmonary arterial hypertension. Normal heart size. 3. Central right upper lobe 1.5 cm irregular pulmonary nodule is stable and suspected to represent the primary bronchogenic malignancy. 4. Bilateral hilar and mediastinal  nodal metastases are stable to mildly increased. 5. Small right pleural effusion. 6. New patchy consolidation and ground-glass attenuation in both lungs, most prominent in the basilar right upper lobe, nonspecific, differential includes multifocal infection, drug reaction and/or pulmonary infarcts. 7. Aortic atherosclerosis.  One vessel coronary atherosclerosis. Critical Value/emergent results were called by telephone at the time of interpretation on 06/19/2016 at 2:02 pm to Dr. Duffy Bruce , who verbally acknowledged these results. Electronically Signed   By: Ilona Sorrel M.D.   On: 06/19/2016 14:03    Procedures .Critical Care Performed by: Duffy Bruce Authorized by: Duffy Bruce   Critical care provider statement:    Critical care time (minutes):  35   Critical care time was exclusive of:  Separately billable procedures and treating other patients   Critical care was necessary to treat or prevent imminent or life-threatening deterioration of the following conditions:  Cardiac failure, circulatory failure and respiratory failure   Critical care was time spent personally by me on the following activities:  Development of treatment plan with patient or surrogate, discussions with consultants, ordering and performing treatments and interventions, ordering and review of laboratory studies, ordering and review of radiographic studies, pulse oximetry, re-evaluation of patient's condition, review of old charts, evaluation of patient's response to treatment, examination of patient and obtaining history from patient or surrogate   I assumed direction of critical care for this patient  from another provider in my specialty: no      (including critical care time)  Medications Ordered in ED Medications  sodium chloride 0.9 % injection (not administered)  sodium chloride 0.9 % bolus 1,000 mL (1,000 mLs Intravenous Not Given 06/19/16 1500)  dexamethasone (DECADRON) tablet 2 mg (2 mg Oral Given  06/20/16 0911)  polyethylene glycol (MIRALAX / GLYCOLAX) packet 17 g (not administered)  tamsulosin (FLOMAX) capsule 0.8 mg (0.8 mg Oral Given 06/19/16 1743)  pantoprazole (PROTONIX) EC tablet 40 mg (40 mg Oral Given 06/20/16 0910)  LORazepam (ATIVAN) tablet 1 mg (not administered)  oxyCODONE (Oxy IR/ROXICODONE) immediate release tablet 5 mg (5 mg Oral Given 06/20/16 0625)  acetaminophen (TYLENOL) tablet 500 mg (not administered)  desmopressin (DDAVP) tablet 0.1 mg (0.1 mg Oral Given 06/19/16 2120)  temazepam (RESTORIL) capsule 15 mg (15 mg Oral Given 06/19/16 2340)  acidophilus (RISAQUAD) capsule 1 capsule (1 capsule Oral Given 06/20/16 0911)  vitamin C (ASCORBIC ACID) tablet 500 mg (500 mg Oral Given 06/20/16 0910)  omega-3 acid ethyl esters (LOVAZA) capsule 1 g (1 g Oral Given 06/20/16 0911)  cholecalciferol (VITAMIN D) tablet 1,000 Units (1,000 Units Oral Given 06/20/16 0911)  multivitamin with minerals tablet 1 tablet (1 tablet Oral Given 06/20/16 0911)  methocarbamol (ROBAXIN) tablet 500 mg (not administered)  acetaminophen (TYLENOL) tablet 650 mg (not administered)    Or  acetaminophen (TYLENOL) suppository 650 mg (not administered)  ondansetron (ZOFRAN) tablet 4 mg (not administered)    Or  ondansetron (ZOFRAN) injection 4 mg (not administered)  HYDROmorphone (DILAUDID) injection 1 mg (1 mg Intravenous Given 06/20/16 0911)  MEDLINE mouth rinse (15 mLs Mouth Rinse Not Given 06/20/16 1000)  argatroban 1 mg/mL infusion (0.8 mcg/kg/min  87.9 kg Intravenous New Bag/Given 06/20/16 0950)  sodium chloride 0.9 % bolus 1,000 mL (0 mLs Intravenous Stopped 06/19/16 1316)  morphine 4 MG/ML injection 4 mg (4 mg Intravenous Given 06/19/16 1211)  ondansetron (ZOFRAN) injection 4 mg (4 mg Intravenous Given 06/19/16 1212)  iopamidol (ISOVUE-370) 76 % injection (62 mLs  Contrast Given 06/19/16 1323)  HYDROmorphone (DILAUDID) injection 1 mg (1 mg Intravenous Given 06/19/16 1313)  potassium chloride  SA (K-DUR,KLOR-CON) CR tablet 40 mEq (40 mEq Oral Given 06/20/16 0911)     Initial Impression / Assessment and Plan / ED Course  I have reviewed the triage vital signs and the nursing notes.  Pertinent labs & imaging results that were available during my care of the patient were reviewed by me and considered in my medical decision making (see chart for details).  Clinical Course as of Jun 20 1105  Sun Jun 19, 2016  1658 ED EKG [CI]  1658 ED EKG [CI]    Clinical Course User Index [CI] Duffy Bruce, MD    75 yo M with PMHx as above here with sharp, right-sided pleuritic CP. On arrival, pt mildly hypotensive but o/w HDS. Initial DDx includes PE given h/o DVT/PE, PNA, PTX, pleurisy 2/2 malignant effusion, chest wall pain. Will check labs, CTA and re-assess.  Labs, imaging as above. CTA PE + for PE. No signs of right heart strain. BP improved with IVF. Concern for PE 2/2 anticoagulation failure versus 2/2 holding anticoagulation in setting of recent SBO versus possible HIT in setting of heparin use, thrombocytopenia during recent admission. Will admit to hospitalist with onc consult for further evaluation. Given moderate hypotension, will admit to SDU. Do NOT feel that hypotension is 2/2 sepsis nad pt has no evidence of infection. Will start on  argatroban drip.  Final Clinical Impressions(s) / ED Diagnoses   Final diagnoses:  Other acute pulmonary embolism without acute cor pulmonale [facility])    New Prescriptions Current Discharge Medication List       Duffy Bruce, MD 06/20/16 1106    Duffy Bruce, MD 06/20/16 1109

## 2016-06-19 NOTE — Progress Notes (Signed)
ANTICOAGULATION CONSULT NOTE - Initial Consult  Pharmacy Consult for Argatroban Indication: pulmonary embolus  Allergies  Allergen Reactions  . Tramadol Palpitations    "Made my chest feel tight"  . Amoxicillin     REACTION: rash  . Penicillins Rash    Has patient had a PCN reaction causing immediate rash, facial/tongue/throat swelling, SOB or lightheadedness with hypotension: unknown Has patient had a PCN reaction causing severe rash involving mucus membranes or skin necrosis: unknown Has patient had a PCN reaction that required hospitalization: no  Has patient had a PCN reaction occurring within the last 10 years: no  If all of the above answers are "NO", then may proceed with Cephalosporin use.     Patient Measurements: Weight: 188 lb (85.3 kg)  IBW: 70.7 kg   Vital Signs: Temp: 98.1 F (36.7 C) (12/10 1051) Temp Source: Oral (12/10 1051) BP: 96/62 (12/10 1316) Pulse Rate: 89 (12/10 1316)  Labs:  Recent Labs  06/19/16 1210  HGB 10.1*  HCT 27.9*  PLT 75*  CREATININE 0.82    Estimated Creatinine Clearance: 84.2 mL/min (by C-G formula based on SCr of 0.82 mg/dL).   Medical History: Past Medical History:  Diagnosis Date  . Adenocarcinoma of right lung, stage 4 ([place]) 05/27/2016  . Adenomatous polyp of colon 1996  . Arthritis    Osteoarthritis right knee, spine, wrist  . Arthritis of hand, right    Right thumb (Dr. Daylene Katayama)  . Back ache    r/o Multiple Myeloma  . Benign prostatic hypertrophy    (Dr. Karsten Ro in past)  . Degenerative disc disease   . Diverticulosis   . DVT (deep venous thrombosis) ([place]) 01/2014   R calf  . Encounter for antineoplastic chemotherapy 10/20/2015  . Erosive esophagitis   . GERD (gastroesophageal reflux disease)   . Hemorrhoids   . Hiatal hernia   . History of chronic prostatitis   . History of melanoma    Back (Dr. Wilhemina Bonito); early melanoma R arm 03/2011  . Multiple myeloma ([place]) 2013   skull lytic lesions--treated with  radiation 07/2014  . Pulmonary embolism ([place]) 01/2014  . Radiation 07/21/14-08/05/14   left occipital parietal skull 25 gray  . SCC (squamous cell carcinoma) 08/04/15   right ear;dysplastic nevus with severe atypia-chest    Medications:  Scheduled:  . sodium chloride       Infusions:    Assessment: 75 yoM admitted on 12/10 with PE and pharmacy is consulted to dose Heparin IV.  PMH is significant for metastatic lung cancer and chronic anticoagulation with Xarelto for history of PE/DVT.  Recent admission 11/26-12/4 for SBO.  During that admission, Xarelto was held on 11/26 for NPO status and in case of surgical intervention.  He was started on Heparin IV on 11/28, but during the course of heparin, his Hgb trended down (14.7 > 12.9 > 11.5 > 10.5 > 9.5), and Plt trended down (baseline Plt 120-170's, admission 181 > 149 > 120 > 94 > 72).  No bleeding or complications were noted.  No evidence of HIT, but HIT panel not ordered.  Heparin infusion was active from 11/28-12/1, but was held for low platelets on 12/1.  Xarelto was resumed on 12/3 and he was discharged on 12/4.  Today, 06/19/2016 Last dose of Xarelto '20mg'$  PO given on 12/10 10:00 AM. SCr 0.82, CrCl ~ 84 ml/min CBC:  Hgb has improved to 10.1, Plt remain low at 75k   Goal of Therapy:  aPTT 50-90 seconds Monitor platelets  by anticoagulation protocol: Yes   Plan:   HIT panel ordered by MD on 06/19/16 - f/u results  Labs:  CBC, CMET, aPTT, PT/INR on 12/11  Plan to initiate parenteral anticoagulant at least 24 hours after last rivaroxaban dose.  For ICU patient, initiate Argatroban at 0.5 mcg/kg/min using actual body weight.  Check aPTT 2 hours after starting, then every 2 hours until therapeutic levels, then q24h.  Gretta Arab PharmD, BCPS Pager [phone] 06/19/2016 2:28 PM

## 2016-06-19 NOTE — ED Notes (Signed)
Patient transported to X-ray 

## 2016-06-19 NOTE — H&P (Signed)
History and Physical        Hospital Admission Note Date: 06/19/2016  Patient name: [name] Medical record number: [id number] Date of birth: [birth year] Age: 75 y.o. Gender: male  PCP: [name],[name], MD   Referring physician: Dr Ellender Hose  Patient coming from: home    Chief Complaint:  Right-sided chest pain with  shortness of breath today  HPI: Patient is a 75 year old male with history of metastatic lung cancer, pulmonary embolism on xarelto who was recently discharged on 06/13/16 after treated for small bowel obstruction presented today to ED with right-sided chest pain and acute shortness of breath. History was obtained from the patient who reported that he woke up this morning feeling severe 10/10 sharp stabbing chest pain in his right side of the chest and shortness of breath, difficulty catching his breath today. He also felt dizzy. No coughing, fevers or chills, nausea or vomiting, diaphoresis. Per patient, he was admitted with small bowel obstruction and was on heparin drip while inpatient and had resumed xarelto during the hospitalization.  ED work-up/course:  Temperature 98.1 and respiratory rate 12 pulse 86 BP 96/75 O2 sats 96% on room air Sodium 126, potassium 3.3 CO2 26 BNP 33.6 troponin 0.0 WBC is 5.7 hemoglobin 10.1 hematocrit 27.9 platelets 75  platelet count 68K on 12/6   Review of Systems: Positives marked in 'bold' Constitutional: Denies fever, chills, diaphoresis, poor appetite and fatigue.  HEENT: Denies photophobia, eye pain, redness, hearing loss, ear pain, congestion, sore throat, rhinorrhea, sneezing, mouth sores, trouble swallowing, neck pain, neck stiffness and tinnitus.   Respiratory: Please see history of present illness Cardiovascular: Denies palpitations and leg swelling.  Gastrointestinal: Denies nausea, vomiting, abdominal pain, diarrhea,  constipation, blood in stool and abdominal distention.  Genitourinary: Denies dysuria, urgency, frequency, hematuria, flank pain and difficulty urinating.  Musculoskeletal: Denies myalgias, back pain, joint swelling, arthralgias and gait problem.  Skin: Denies pallor, rash and wound.  Neurological: Denies dizziness, seizures, syncope, weakness, light-headedness, numbness and headaches.  Hematological: Denies adenopathy. Easy bruising, personal or family bleeding history  Psychiatric/Behavioral: Denies suicidal ideation, mood changes, confusion, nervousness, sleep disturbance and agitation  Past Medical History: Past Medical History:  Diagnosis Date  . Adenocarcinoma of right lung, stage 4 ([place]) 05/27/2016  . Adenomatous polyp of colon 1996  . Arthritis    Osteoarthritis right knee, spine, wrist  . Arthritis of hand, right    Right thumb (Dr. Daylene Katayama)  . Back ache    r/o Multiple Myeloma  . Benign prostatic hypertrophy    (Dr. Karsten Ro in past)  . Degenerative disc disease   . Diverticulosis   . DVT (deep venous thrombosis) ([place]) 01/2014   R calf  . Encounter for antineoplastic chemotherapy 10/20/2015  . Erosive esophagitis   . GERD (gastroesophageal reflux disease)   . Hemorrhoids   . Hiatal hernia   . History of chronic prostatitis   . History of melanoma    Back (Dr. Wilhemina Bonito); early melanoma R arm 03/2011  . Multiple myeloma ([place]) 2013   skull lytic lesions--treated with radiation 07/2014  . Pulmonary embolism ([place]) 01/2014  . Radiation 07/21/14-08/05/14   left occipital parietal skull 25 gray  .  SCC (squamous cell carcinoma) 08/04/15   right ear;dysplastic nevus with severe atypia-chest    Past Surgical History:  Procedure Laterality Date  . APPENDECTOMY    . BONE MARROW TRANSPLANT  03/2013   Stem Cell  . COLONOSCOPY  2009   adenomatous polyp  . ESOPHAGOGASTRODUODENOSCOPY  2009   mild inflammation at esophagogastric junction  . excision of melanoma     back ( x3)  .  Great toe surgery     bilateral  . INGUINAL HERNIA REPAIR     left, x 2  . port placemet  10/2012  . SCALENE NODE BIOPSY Right 05/11/2016   Procedure: BIOPSY RIGHT SCALENE NODE;  Surgeon: Grace Isaac, MD;  Location: [facility];  Service: Thoracic;  Laterality: Right;  . TOTAL KNEE ARTHROPLASTY  06/08/2011   Procedure: TOTAL KNEE ARTHROPLASTY;  Surgeon: Ninetta Lights, MD;  Location: [facility];  Service: Orthopedics;  Laterality: Right;  osteonics  . total knee replaced  2000   Left knee    Medications: Prior to Admission medications   Medication Sig Start Date End Date Taking? Authorizing Provider  acetaminophen (TYLENOL) 500 MG tablet Take 500 mg by mouth every 6 (six) hours as needed for moderate pain or headache.   Yes Historical Provider, MD  ascorbic acid (VITAMIN C) 500 MG tablet Take 500 mg by mouth 2 (two) times daily.    Yes Historical Provider, MD  cholecalciferol (VITAMIN D) 1000 UNITS tablet Take 1,000 Units by mouth daily.   Yes Historical Provider, MD  desmopressin (DDAVP) 0.1 MG tablet Take 1 tablet (0.1 mg total) by mouth at bedtime. 04/25/16  Yes Rita Ohara, MD  dexamethasone (DECADRON) 4 MG tablet Take 1 tablet (4 mg total) by mouth 2 (two) times daily. Patient taking differently: Take 2 mg by mouth daily.  06/13/16  Yes Barton Dubois, MD  fish oil-omega-3 fatty acids 1000 MG capsule Take 1 g by mouth daily.    Yes Historical Provider, MD  furosemide (LASIX) 20 MG tablet Take 1 tablet (20 mg total) by mouth daily. Patient taking differently: Take 20 mg by mouth daily as needed for fluid.  02/29/16  Yes Rita Ohara, MD  LORazepam (ATIVAN) 0.5 MG tablet Take 1 tab PO Q 6 hours PRN nausea or anxiety 05/18/16  Yes Susanne Borders, NP  methocarbamol (ROBAXIN) 500 MG tablet TAKE 1 TO 2 TABLETS BY MOUTH EVERY 6 HOURS AS NEEDED FOR MUSCLE SPASMS 12/09/15  Yes Rita Ohara, MD  Multiple Vitamin (MULTIVITAMIN WITH MINERALS) TABS Take 1 tablet by mouth daily.   Yes Historical Provider, MD    ondansetron (ZOFRAN) 8 MG tablet Take 1 tablet (8 mg total) by mouth every 8 (eight) hours as needed for nausea or vomiting. 05/18/16  Yes Susanne Borders, NP  oxyCODONE (ROXICODONE) 5 MG immediate release tablet Take 1-2 tabs PO Q 6 hours PRN pain. 05/18/16  Yes Susanne Borders, NP  pantoprazole (PROTONIX) 40 MG tablet TAKE 1 TABLET BY MOUTH TWO  TIMES DAILY 05/21/16  Yes Curt Bears, MD  polyethylene glycol [facility]) packet Take 17 g by mouth daily as needed for mild constipation. 06/13/16  Yes Barton Dubois, MD  Probiotic Product (ALIGN) 4 MG CAPS Take 1 capsule by mouth daily. Reported on 09/10/2015   Yes Historical Provider, MD  prochlorperazine (COMPAZINE) 10 MG tablet Take 1 tablet (10 mg total) by mouth every 6 (six) hours as needed. Patient taking differently: Take 10 mg by mouth every 6 (six) hours as  needed for nausea or vomiting.  04/29/16  Yes Curt Bears, MD  tamsulosin (FLOMAX) 0.4 MG CAPS capsule Take 2 capsules (0.8 mg total) by mouth daily after supper. 06/13/16  Yes Kathie Rhodes, MD  temazepam (RESTORIL) 15 MG capsule Take 1 capsule (15 mg total) by mouth at bedtime as needed. Patient taking differently: Take 15 mg by mouth at bedtime as needed for sleep.  04/08/16  Yes Curt Bears, MD  XARELTO 20 MG TABS tablet TAKE 1 TABLET BY MOUTH  DAILY 05/21/16  Yes Curt Bears, MD  zolendronic acid (ZOMETA) 4 MG/5ML injection Inject 4 mg into the vein every 3 (three) months.    Yes Historical Provider, MD    Allergies:   Allergies  Allergen Reactions  . Tramadol Palpitations    "Made my chest feel tight"  . Amoxicillin     REACTION: rash  . Penicillins Rash    Has patient had a PCN reaction causing immediate rash, facial/tongue/throat swelling, SOB or lightheadedness with hypotension: unknown Has patient had a PCN reaction causing severe rash involving mucus membranes or skin necrosis: unknown Has patient had a PCN reaction that required hospitalization: no  Has patient  had a PCN reaction occurring within the last 10 years: no  If all of the above answers are "NO", then may proceed with Cephalosporin use.     Social History:  reports that he quit smoking about 39 years ago. His smoking use included Cigarettes. He has a 30.00 pack-year smoking history. He has never used smokeless tobacco. He reports that he drinks about 1.5 oz of alcohol per week . He reports that he does not use drugs.  Family History: Family History  Problem Relation Age of Onset  . Dementia Mother   . Stroke Father     brainstem stroke  . Hypertension Father   . Diabetes Father   . Prostate cancer Maternal Grandfather   . Depression Daughter   . Anesthesia problems Neg Hx     Physical Exam: Blood pressure 101/68, pulse 82, temperature 98.1 F (36.7 C), temperature source Oral, resp. rate 20, weight 85.3 kg (188 lb), SpO2 96 %. General: Alert, awake, oriented x3, in no acute distress. HEENT: normocephalic, atraumatic, anicteric sclera, pink conjunctiva, pupils equal and reactive to light and accomodation, oropharynx clear Neck: supple, no masses or lymphadenopathy, no goiter, no bruits  Heart: Regular rate and rhythm, without murmurs, rubs or gallops. Lungs: Clear to auscultation bilaterally, no wheezing, rales or rhonchi. Port in the right chest wall Abdomen: Soft, nontender, nondistended, positive bowel sounds, no masses. Extremities: No clubbing, cyanosis or edema with positive pedal pulses. Neuro: Grossly intact, no focal neurological deficits, strength 5/5 upper and lower extremities bilaterally Psych: alert and oriented x 3, normal mood and affect Skin: no rashes or lesions, warm and dry   LABS on Admission:  Basic Metabolic Panel:  Recent Labs Lab 06/13/16 0428 06/15/16 0852 06/19/16 1210  NA 144 132* 126*  K 4.1 4.1 3.3*  CL 113*  --  93*  CO2 _0 GLUCOSE 142* 101 93  BUN 15 19.0 9  CREATININE 0.91 0.8 0.82  CALCIUM 9.4 8.5 7.9*   Liver Function  Tests:  Recent Labs Lab 06/15/16 0852 06/19/16 1210  AST 10 15  ALT 27 21  ALKPHOS 77 65  BILITOT 0.99 0.9  PROT 5.6* 5.2*  ALBUMIN 2.8* 2.6*   No results for input(s): LIPASE, AMYLASE in the last 168 hours. No results for input(s): AMMONIA in  the last 168 hours. CBC:  Recent Labs Lab 06/15/16 0852 06/19/16 1210  WBC 8.5 5.7  NEUTROABS 7.7* 5.3  HGB 12.3* 10.1*  HCT 36.2* 27.9*  MCV 94.5 89.4  PLT 68* 75*   Cardiac Enzymes: No results for input(s): CKTOTAL, CKMB, CKMBINDEX, TROPONINI in the last 168 hours. BNP: Invalid input(s): POCBNP CBG:  Recent Labs Lab 06/13/16 0726 06/13/16 1157  GLUCAP 129* 142*    Radiological Exams on Admission:  No results found.  *I have personally reviewed the images above*  EKG: Independently reviewed. Rate 84, normal sinus rhythm   Assessment/Plan Principal Problem:  Acute right lung Pulmonary emboli ([place]): In the setting of thrombocytopenia, recent heparin use  - Patient was admitted with small bowel bowel obstruction during the previous admission and was placed on heparin drip, xarelto was held. On 11/26, PLT count was 181 K  and subsequently trended down to 94K on 11/30  and 68K on 12/6 when patient was discharged. No HIT panel available. - Sent HIT panel, SRA panel, DIC panel, LDH, haptoglobin, (2 g drop in hemoglobin from discharge 12.3 -> 10.1 on admission) - discussed with oncology, Dr Burr Medico, per recommendations  started on argatroban per pharmacy. Oncology will follow. - Follow 2-D echo    Active Problems:  Thrombocytopenia - As #1  Metastatic Lung cancer - Oncology will follow  Hyponatremia -Continue DDAVP  - Follow BMET in a.m.  Hypokalemia - We will replace  DVT prophylaxis: On argatroban per pharmacy.  CODE STATUS: full code   Consults called: oncology, Dr Burr Medico   Family Communication: Admission, patients condition and plan of care including tests being ordered have been discussed with the patient  and wife who indicates understanding and agree with the plan and Code Status  Admission status: SDU  Disposition plan: Further plan will depend as patient's clinical course evolves and further radiologic and laboratory data become available. Likely home When stable  At the time of admission, it appears that the appropriate admission status for this patient is INPATIENT . This is judged to be reasonable and necessary in order to provide the required intensity of service to ensure the patient's safety given the presenting symptoms, physical exam findings, and initial radiographic and laboratory data in the context of their chronic comorbidities.      Time Spent on Admission: 26mns   Kymberli Wiegand M.D. [facility] 06/19/2016, 3:36 PM Pager: [phone] If 7PM-7AM, please contact night-coverage [email or web address] Password [email or web address]

## 2016-06-19 NOTE — ED Triage Notes (Signed)
Pt complains of shortness of breath, pain around his port-a-cath in right chest, and dizziness since an infusion of a cancer treatment drug, Keytruda, on Wednesday. Pt has hx of lung cancer, states this is his first time being treated with the drug.

## 2016-06-20 ENCOUNTER — Other Ambulatory Visit: Payer: Self-pay | Admitting: Internal Medicine

## 2016-06-20 ENCOUNTER — Inpatient Hospital Stay (HOSPITAL_COMMUNITY): Payer: Medicare Other

## 2016-06-20 DIAGNOSIS — Z7901 Long term (current) use of anticoagulants: Secondary | ICD-10-CM

## 2016-06-20 DIAGNOSIS — I2699 Other pulmonary embolism without acute cor pulmonale: Principal | ICD-10-CM

## 2016-06-20 DIAGNOSIS — D696 Thrombocytopenia, unspecified: Secondary | ICD-10-CM

## 2016-06-20 DIAGNOSIS — C9000 Multiple myeloma not having achieved remission: Secondary | ICD-10-CM

## 2016-06-20 DIAGNOSIS — C7931 Secondary malignant neoplasm of brain: Secondary | ICD-10-CM

## 2016-06-20 DIAGNOSIS — C3490 Malignant neoplasm of unspecified part of unspecified bronchus or lung: Secondary | ICD-10-CM

## 2016-06-20 DIAGNOSIS — E440 Moderate protein-calorie malnutrition: Secondary | ICD-10-CM | POA: Insufficient documentation

## 2016-06-20 DIAGNOSIS — C3411 Malignant neoplasm of upper lobe, right bronchus or lung: Secondary | ICD-10-CM

## 2016-06-20 LAB — BASIC METABOLIC PANEL
Anion gap: 7 (ref 5–15)
BUN: 10 mg/dL (ref 6–20)
CHLORIDE: 98 mmol/L — AB (ref 101–111)
CO2: 25 mmol/L (ref 22–32)
CREATININE: 0.8 mg/dL (ref 0.61–1.24)
Calcium: 8 mg/dL — ABNORMAL LOW (ref 8.9–10.3)
GFR calc Af Amer: >60 mL/min (ref >60)
GFR calc non Af Amer: >60 mL/min (ref >60)
Glucose, Bld: 136 mg/dL — ABNORMAL HIGH (ref 65–99)
POTASSIUM: 3.4 mmol/L — AB (ref 3.5–5.1)
Sodium: 130 mmol/L — ABNORMAL LOW (ref 135–145)

## 2016-06-20 LAB — CBC
HEMATOCRIT: 28.1 % — AB (ref 39.0–52.0)
Hemoglobin: 9.8 g/dL — ABNORMAL LOW (ref 13.0–17.0)
MCH: 32.7 pg (ref 26.0–34.0)
MCHC: 34.9 g/dL (ref 30.0–36.0)
MCV: 93.7 fL (ref 78.0–100.0)
PLATELETS: 83 10*3/uL — AB (ref 150–400)
RBC: 3 MIL/uL — AB (ref 4.22–5.81)
RDW: 16.6 % — ABNORMAL HIGH (ref 11.5–15.5)
WBC: 5.6 10*3/uL (ref 4.0–10.5)

## 2016-06-20 LAB — APTT
APTT: 70 s — AB (ref 24–36)
aPTT: 27 seconds (ref 24–36)
aPTT: 52 seconds — ABNORMAL HIGH (ref 24–36)
aPTT: 74 seconds — ABNORMAL HIGH (ref 24–36)

## 2016-06-20 LAB — ECHOCARDIOGRAM COMPLETE
HEIGHTINCHES: 69 in
WEIGHTICAEL: 3100.55 [oz_av]

## 2016-06-20 LAB — PROTIME-INR
INR: 1.06
Prothrombin Time: 13.8 seconds (ref 11.4–15.2)

## 2016-06-20 MED ORDER — SODIUM CHLORIDE 0.9% FLUSH
10.0000 mL | INTRAVENOUS | Status: DC | PRN
Start: 2016-06-20 — End: 2016-06-22
  Administered 2016-06-21 – 2016-06-22 (×2): 10 mL
  Filled 2016-06-20 (×2): qty 40

## 2016-06-20 MED ORDER — HYDROMORPHONE HCL 1 MG/ML IJ SOLN
0.5000 mg | INTRAMUSCULAR | Status: DC | PRN
  Administered 2016-06-20 – 2016-06-21 (×2): 0.5 mg via INTRAVENOUS
  Filled 2016-06-20 (×2): qty 0.5

## 2016-06-20 MED ORDER — ARGATROBAN 50 MG/50ML IV SOLN
0.8000 ug/kg/min | INTRAVENOUS | Status: DC
  Administered 2016-06-20 – 2016-06-22 (×5): 0.8 ug/kg/min via INTRAVENOUS
  Filled 2016-06-20 (×8): qty 50

## 2016-06-20 MED ORDER — HYDROMORPHONE HCL 1 MG/ML IJ SOLN: 0.5000 mg | INTRAMUSCULAR | Status: DC | PRN

## 2016-06-20 MED ORDER — POTASSIUM CHLORIDE CRYS ER 20 MEQ PO TBCR
40.0000 meq | EXTENDED_RELEASE_TABLET | Freq: Once | ORAL | Status: AC
  Administered 2016-06-20: 40 meq via ORAL
  Filled 2016-06-20: qty 2

## 2016-06-20 NOTE — Progress Notes (Addendum)
ANTICOAGULATION CONSULT NOTE - follow-up Consult  Pharmacy Consult for Argatroban Indication: pulmonary embolus  Allergies  Allergen Reactions  . Tramadol Palpitations    "Made my chest feel tight"  . Amoxicillin     REACTION: rash  . Penicillins Rash    Has patient had a PCN reaction causing immediate rash, facial/tongue/throat swelling, SOB or lightheadedness with hypotension: unknown Has patient had a PCN reaction causing severe rash involving mucus membranes or skin necrosis: unknown Has patient had a PCN reaction that required hospitalization: no  Has patient had a PCN reaction occurring within the last 10 years: no  If all of the above answers are "NO", then may proceed with Cephalosporin use.     Patient Measurements: Height: '5\' 9"'$  (175.3 cm) Weight: 193 lb 12.6 oz (87.9 kg) IBW/kg (Calculated) : 70.7   Vital Signs: Temp: 97.6 F (36.4 C) (12/11 1300) Temp Source: Oral (12/11 1300) BP: 99/66 (12/11 1300) Pulse Rate: 93 (12/11 1300)  Labs:  Recent Labs  06/19/16 1210  06/19/16 1610 06/19/16 1615 06/20/16 0340 06/20/16 1205 06/20/16 1523  HGB 10.1*  --   --   --  9.8*  --   --   HCT 27.9*  --   --   --  28.1*  --   --   PLT 75*  --  79*  --  83*  --   --   APTT  --   < > 34  --  27 70* 52*  LABPROT  --   --  16.3*  --  13.8  --   --   INR  --   --  1.31  --  1.06  --   --   CREATININE 0.82  --   --  0.82 0.80  --   --   < > = values in this interval not displayed.  Estimated Creatinine Clearance: 87.6 mL/min (by C-G formula based on SCr of 0.8 mg/dL).   Assessment: 2 yoM admitted on 12/10 with PE and pharmacy is consulted to dose Heparin IV.  PMH is significant for metastatic lung cancer and chronic anticoagulation with Xarelto for history of PE/DVT.  Recent admission 11/26-12/4 for SBO.  During that admission, Xarelto was held on 11/26 for NPO status and in case of surgical intervention.  He was started on Heparin IV on 11/28, but during the course of  heparin, his Hgb trended down (14.7 > 12.9 > 11.5 > 10.5 > 9.5), and Plt trended down (baseline Plt 120-170's, admission 181 > 149 > 120 > 94 > 72).  No bleeding or complications were noted.  No evidence of HIT, but HIT panel not ordered.  Heparin infusion was active from 11/28-12/1, but was held for low platelets on 12/1.  Xarelto was resumed on 12/3 and he was discharged on 12/4.  Last dose of Xarelto '20mg'$  PO given on 12/10 10:00 AM.  Today, 06/20/2016  CBC: Hgb trending down but relatively stable from 12/10, Pltc slightly improved  Renal: SCr WNL  Hepatic: AST/ALT, Alk Phos, Tbili WNL (06/19/16)  aPTT (prior to start of argatroban) = 27sec  First aPTT (2h after start of argatroban) therapeutic at 70 seconds-drawn from port per nursing  Second aPTT at 1523 = 52 seconds (peripheral stick), remains therapeutic, but on lower end of goal range  Goal of Therapy:  aPTT 50-90 seconds Monitor platelets by anticoagulation protocol: Yes   Plan:   Continue argatroban at 0.8 mcg/kg/min (4.57m/hr), as second level is therapeutic  Re-check  aPTT in ~ 2h (1730) via peripheral stick to confirm aPTT remains therapeutic, given that the second level is at the lower end of therapeutic range. If within goal range, will check aPTT daily.   Daily CBC  F/u HIT panel and SRA results   Lindell Spar, PharmD, BCPS Pager: [phone] 06/20/2016 5:26 PM    Addendum: aPTT at 1831 = 74 seconds, remains within therapeutic range No bleeding or IV site issues per nursing   Plan:   Continue argatroban at 0.8 mcg/kg/min (4.2 ml/hr).  Daily aPTT (peripheral stick), CBC.  F/u HIT panel and SRA results.    Lindell Spar, PharmD, BCPS Pager: [phone] 06/20/2016 7:14 PM

## 2016-06-20 NOTE — Progress Notes (Signed)
ANTICOAGULATION CONSULT NOTE - follow-up Consult  Pharmacy Consult for Argatroban Indication: pulmonary embolus  Allergies  Allergen Reactions  . Tramadol Palpitations    "Made my chest feel tight"  . Amoxicillin     REACTION: rash  . Penicillins Rash    Has patient had a PCN reaction causing immediate rash, facial/tongue/throat swelling, SOB or lightheadedness with hypotension: unknown Has patient had a PCN reaction causing severe rash involving mucus membranes or skin necrosis: unknown Has patient had a PCN reaction that required hospitalization: no  Has patient had a PCN reaction occurring within the last 10 years: no  If all of the above answers are "NO", then may proceed with Cephalosporin use.     Patient Measurements: Height: '5\' 9"'$  (175.3 cm) Weight: 193 lb 12.6 oz (87.9 kg) IBW/kg (Calculated) : 70.7  IBW: 70.7 kg   Vital Signs: Temp: 98 F (36.7 C) (12/11 0336) Temp Source: Oral (12/11 0336) BP: 103/59 (12/11 0630) Pulse Rate: 88 (12/11 0600)  Labs:  Recent Labs  06/19/16 1210 06/19/16 1610 06/19/16 1615 06/20/16 0340  HGB 10.1*  --   --  9.8*  HCT 27.9*  --   --  28.1*  PLT 75* 79*  --  83*  APTT  --  34  --  27  LABPROT  --  16.3*  --  13.8  INR  --  1.31  --  1.06  CREATININE 0.82  --  0.82 0.80    Estimated Creatinine Clearance: 87.6 mL/min (by C-G formula based on SCr of 0.8 mg/dL).   Assessment: 47 yoM admitted on 12/10 with PE and pharmacy is consulted to dose Heparin IV.  PMH is significant for metastatic lung cancer and chronic anticoagulation with Xarelto for history of PE/DVT.  Recent admission 11/26-12/4 for SBO.  During that admission, Xarelto was held on 11/26 for NPO status and in case of surgical intervention.  He was started on Heparin IV on 11/28, but during the course of heparin, his Hgb trended down (14.7 > 12.9 > 11.5 > 10.5 > 9.5), and Plt trended down (baseline Plt 120-170's, admission 181 > 149 > 120 > 94 > 72).  No bleeding or  complications were noted.  No evidence of HIT, but HIT panel not ordered.  Heparin infusion was active from 11/28-12/1, but was held for low platelets on 12/1.  Xarelto was resumed on 12/3 and he was discharged on 12/4.  Last dose of Xarelto '20mg'$  PO given on 12/10 10:00 AM.  Today, 06/20/2016  CBC: Hgb trending down but relatively stable from 12/10, pltc slightly improved  Renal: SCr WNL  APTT (prior to start of argatroban) = 27sec  Goal of Therapy:  aPTT 50-90 seconds Monitor platelets by anticoagulation protocol: Yes   Plan:   At 10am, start argatroban at 0.8 mcg/kg/min (4.73m/hr)  Patient in step-down so does not require more conservative reduced dose (0.5 mcg/kg/min for ICU patients).    Dose was reduced from 176m/kg/min empirically  Check aPTT 2 hours after starting, then every 2 hours until therapeutic levels, then q24h.  F/u HIT panel and SRA results  DuDoreene ElandPharmD, BCPS.   Pager: [phone]/05/2016 7:37 AM

## 2016-06-20 NOTE — Progress Notes (Signed)
  Radiation Oncology         [phone] ________________________________  Name: [name] MRN: [id number]  Date: 05/13/2016  DOB: 03-Mar-1941  DIAGNOSIS:     ICD-9-CM ICD-10-CM   1. Brain metastases (HCC) 198.3 C79.31 LORazepam (ATIVAN) tablet 0.5 mg    NARRATIVE:  The patient was brought to the [facility].  Identity was confirmed.  All relevant records and images related to the planned course of therapy were reviewed.  The patient freely provided informed written consent to proceed with treatment after reviewing the details related to the planned course of therapy. The consent form was witnessed and verified by the simulation staff. Intravenous access was established for contrast administration. Then, the patient was set-up in a stable reproducible supine position for radiation therapy.  A relocatable thermoplastic stereotactic head frame was fabricated for precise immobilization.  CT images were obtained.  Surface markings were placed.  The CT images were loaded into the planning software and fused with the patient's targeting MRI scan.  Then the target and avoidance structures were contoured.  Treatment planning then occurred.  The radiation prescription was entered and confirmed.  I have requested 3D planning  I have requested a DVH of the following structures: Brain stem, brain, left eye, right eye, lenses, optic chiasm, target volumes, uninvolved brain, and normal tissue.    SPECIAL TREATMENT PROCEDURE:  The planned course of therapy using radiation constitutes a special treatment procedure. Special care is required in the management of this patient for the following reasons. This treatment constitutes a Special Treatment Procedure for the following reason: High dose per fraction requiring special monitoring for increased toxicities of treatment including daily imaging.  The special nature of the planned course of radiotherapy will require increased physician supervision  and oversight to ensure patient's safety with optimal treatment outcomes.  PLAN:  The patient will receive 20 Gy in 1 fraction to 8 intracranial lesions and he will also receive 25 gray in 5 fractions to a metastasis within the pituitary.   ------------------------------------------------  [name], MD, PhD

## 2016-06-20 NOTE — Care Management Note (Signed)
Case Management Note  Patient Details  Name: YUTO CAJUSTE MRN: [id number] Date of Birth: [birth year]  Subjective/Objective:    Pulmonary emboli by cxr                Action/Plan:  From house with spouse Date:  June 20, 2016 Chart reviewed for concurrent status and case management needs. Will continue to follow patient progress. Discharge Planning: following for needs Expected discharge date: [id number] Velva Harman, BSN, [place], [place]  Expected Discharge Date:  06/23/16               Expected Discharge Plan:  Home/Self Care  In-House Referral:     Discharge planning Services     Post Acute Care Choice:    Choice offered to:     DME Arranged:    DME Agency:     HH Arranged:    HH Agency:     Status of Service:  In process, will continue to follow  If discussed at Long Length of Stay Meetings, dates discussed:    Additional Comments:  Leeroy Cha, RN 06/20/2016, 8:52 AM

## 2016-06-20 NOTE — Progress Notes (Signed)
ANTICOAGULATION CONSULT NOTE - follow-up Consult  Pharmacy Consult for Argatroban Indication: pulmonary embolus  Allergies  Allergen Reactions  . Tramadol Palpitations    "Made my chest feel tight"  . Amoxicillin     REACTION: rash  . Penicillins Rash    Has patient had a PCN reaction causing immediate rash, facial/tongue/throat swelling, SOB or lightheadedness with hypotension: unknown Has patient had a PCN reaction causing severe rash involving mucus membranes or skin necrosis: unknown Has patient had a PCN reaction that required hospitalization: no  Has patient had a PCN reaction occurring within the last 10 years: no  If all of the above answers are "NO", then may proceed with Cephalosporin use.     Patient Measurements: Height: '5\' 9"'$  (175.3 cm) Weight: 193 lb 12.6 oz (87.9 kg) IBW/kg (Calculated) : 70.7  IBW: 70.7 kg   Vital Signs: Temp: 98.1 F (36.7 C) (12/11 0800) Temp Source: Oral (12/11 0800) BP: 102/57 (12/11 1200) Pulse Rate: 100 (12/11 1200)  Labs:  Recent Labs  06/19/16 1210 06/19/16 1610 06/19/16 1615 06/20/16 0340 06/20/16 1205  HGB 10.1*  --   --  9.8*  --   HCT 27.9*  --   --  28.1*  --   PLT 75* 79*  --  83*  --   APTT  --  34  --  27 70*  LABPROT  --  16.3*  --  13.8  --   INR  --  1.31  --  1.06  --   CREATININE 0.82  --  0.82 0.80  --     Estimated Creatinine Clearance: 87.6 mL/min (by C-G formula based on SCr of 0.8 mg/dL).   Assessment: 47 yoM admitted on 12/10 with PE and pharmacy is consulted to dose Heparin IV.  PMH is significant for metastatic lung cancer and chronic anticoagulation with Xarelto for history of PE/DVT.  Recent admission 11/26-12/4 for SBO.  During that admission, Xarelto was held on 11/26 for NPO status and in case of surgical intervention.  He was started on Heparin IV on 11/28, but during the course of heparin, his Hgb trended down (14.7 > 12.9 > 11.5 > 10.5 > 9.5), and Plt trended down (baseline Plt 120-170's,  admission 181 > 149 > 120 > 94 > 72).  No bleeding or complications were noted.  No evidence of HIT, but HIT panel not ordered.  Heparin infusion was active from 11/28-12/1, but was held for low platelets on 12/1.  Xarelto was resumed on 12/3 and he was discharged on 12/4.  Last dose of Xarelto '20mg'$  PO given on 12/10 10:00 AM.  Today, 06/20/2016  CBC: Hgb trending down but relatively stable from 12/10, pltc slightly improved  Renal: SCr WNL  APTT (prior to start of argatroban) = 27sec  First aPTT (2h after start of argatroban) therapeutic at 70sec  Goal of Therapy:  aPTT 50-90 seconds Monitor platelets by anticoagulation protocol: Yes   Plan:   Continue argatroban at 0.8 mcg/kg/min (4.48m/hr) as first level is therapeutic  Patient in step-down so does not require more conservative reduced dose (0.5 mcg/kg/min for ICU patients).  To transfer to telemetry floor  Dose was reduced from 181m/kg/min empirically  Check aPTT in ~ 2h to confirm therapeutic aPTT, If therapeutic then check daily.  Daily CBC  F/u HIT panel and SRA results  DuDoreene ElandPharmD, BCPS.   Pager: [phone]/05/2016 12:42 PM

## 2016-06-20 NOTE — Progress Notes (Signed)
  Radiation Oncology         [phone]) [phone] ________________________________  Name: [name] MRN: [id number]  Date: 05/20/2016  DOB: 1940-12-24   SPECIAL TREATMENT PROCEDURE   3D TREATMENT PLANNING AND DOSIMETRY: The patient's radiation plan was reviewed and approved by Dr. Sherwood Gambler from neurosurgery and radiation oncology prior to treatment. It showed 3-dimensional radiation distributions overlaid onto the planning CT/MRI image set. The [facility] for the target structures as well as the organs at risk were reviewed. The documentation of the 3D plan and dosimetry are filed in the radiation oncology EMR.   NARRATIVE: The patient was brought to the TrueBeam stereotactic radiation treatment machine and placed supine on the CT couch. The head frame was applied, and the patient was set up for stereotactic radiosurgery. Neurosurgery was present for the set-up and delivery   SIMULATION VERIFICATION: In the couch zero-angle position, the patient underwent Exactrac imaging using the Brainlab system with orthogonal KV images. These were carefully aligned and repeated to confirm treatment position for each of the isocenters. The Exactrac snap film verification was repeated at each couch angle.   SPECIAL TREATMENT PROCEDURE: The patient received stereotactic radiosurgery to the following target:  8 intracranial targets was treated using 35 Arcs to a prescription dose of 20 Gy. The pituitary target was simultaneously treated to a dose of 5 gray and this will receive an additional 20 gray in 4 additional fractions. (These treatments will be carried out using a planned with an additional 5 arcs.) ExacTrac Snap verification was performed for each couch angle.   STEREOTACTIC TREATMENT MANAGEMENT: Following delivery, the patient was transported to nursing in stable condition and monitored for possible acute effects. Vital signs were recorded . The patient tolerated treatment without significant acute effects, and  was discharged to home in stable condition.  PLAN: The patient will return on 05/23/2016 to continue treatment to the pituitary metastasis.   ------------------------------------------------  [name], MD, PhD

## 2016-06-20 NOTE — Progress Notes (Signed)
Initial Nutrition Assessment  DOCUMENTATION CODES:   Non-severe (moderate) malnutrition in context of acute illness/injury  INTERVENTION:  - Continue multivitamin with minerals. - Diet to be liberalized from Heart Healthy to Regular per discussion with Dr. Wyline Copas. - Continue to encourage PO intakes and slow incorporation of fiber-containing foods. - RD will continue to monitor for needs and will discuss oral nutrition supplements at follow-up.  NUTRITION DIAGNOSIS:   Increased nutrient needs related to catabolic illness, cancer and cancer related treatments as evidenced by estimated needs.  GOAL:   Patient will meet greater than or equal to 90% of their needs  MONITOR:   PO intake, Weight trends, Labs, I & O's  REASON FOR ASSESSMENT:   Malnutrition Screening Tool  ASSESSMENT:   75 year old male with history of metastatic lung cancer, pulmonary embolism on xarelto who was recently discharged on 06/13/16 after treated for small bowel obstruction presented today to ED with right-sided chest pain and acute shortness of breath. History was obtained from the patient who reported that he woke up this morning feeling severe 10/10 sharp stabbing chest pain in his right side of the chest and shortness of breath, difficulty catching his breath today. He also felt dizzy. No coughing, fevers or chills, nausea or vomiting, diaphoresis. Per patient, he was admitted with small bowel obstruction and was on heparin drip while inpatient and had resumed xarelto during the hospitalization.  Pt with hx of stage 4 NSCLC with brain mets s/p radiation. Pt seen for MST. BMI indicates overweight status. No intakes documented since admission but pt reports he was able to consume 100% of breakfast this AM without issue or discomfort. This meal consisted of 2 hamburger patties, 2 slices of tomato, an English muffin, and a blueberry muffin. Pt's wife is bringing him lunch and he asked her to bring him a grilled  chicken wrap. Ordered dinner for pt: pot roast, pinto beans, chocolate ice cream, and 2 cups of unsweetened iced tea with Sweet and Low.   Pt was recently hospitalized for SBO and was informed to remain on a soft (low fiber) diet x1 week after d/c. This one week period is over today or tomorrow. Encouraged pt to incorporate 1 fiber-containing food item at meal times today and tomorrow and to slowly continue adding fiber back into his diet. He states that he has not had a BM x2 days but that he has been passing gas during this time frame.   He reports that he receives treatment for caner 3 times per month. Per chart review, last treatment was on 06/15/16. Pt states that for the past 1 week he has had taste alteration (bland taste) and that this is new for him. He states that his appetite has been decreased for approximately 1 month and that he feels full quickly; states that if he eats too much at any meal he feels he will vomit.   Pt requests diet liberalization d/t decreased intakes; RD in agreement with this in addition to need for increased kcal and protein intake d/t cancer/cancer treatment but pt with decreased quantity of food intake throughout the day. Will discuss oral nutrition supplements at follow-up.   Physical assessment shows mild muscle and mild fat wasting to upper body. Lower body not assessed at this time. Per RN assessment, pt with moderate edema. Pt currently with hyponatremia and hypochloridemia. Per chart review, pt lost 15 lbs (7% body weight) from 11/27-12/8 which is significant for time frame. Unsure if this weight change is reflective  of muscle and fat wasting versus fluid weight changes given recent hospitalization for SBO; will continue to monitor weight trends closely.  Medications reviewed; 1 capsule Risaquad/day, 1000 units vitamin D/day, daily multivitamin with minerals, PRN Zofran, 40 mg oral Protonix BID, PRN Miralax, 40 mEq oral KCl x1 dose today, 500 mg oral ascorbic acid  BID.  Labs reviewed; Na: 130 mmol/L, Cl: 98 mmol/L, K: 3.4 mmol/L, Ca: 8 mg/dL.   Diet Order:  Diet Heart Room service appropriate? Yes; Fluid consistency: Thin  Skin:  Reviewed, no issues  Last BM:  12/9 (PTA)  Height:   Ht Readings from Last 1 Encounters:  06/19/16 '5\' 9"'$  (1.753 m)    Weight:   Wt Readings from Last 1 Encounters:  06/19/16 193 lb 12.6 oz (87.9 kg)    Ideal Body Weight:  72.73 kg  BMI:  Body mass index is 28.62 kg/m.  Estimated Nutritional Needs:   Kcal:  1093-2355 (25-27 kcal/kg)  Protein:  105-123 grams (1.2-1.4 grams/kg)  Fluid:  >/= 2.2 L/day (25 mL/kg/day)  EDUCATION NEEDS:   No education needs identified at this time    Jarome Matin, MS, RD, LDN, CNSC Inpatient Clinical Dietitian Pager # [phone] After hours/weekend pager # [phone]

## 2016-06-20 NOTE — Progress Notes (Signed)
Subjective: The patient is seen and examined today. He is a very pleasant 75 years old white male with history of multiple myeloma and recently diagnosed metastatic non-small cell lung cancer, adenocarcinoma with multiple brain metastasis. He is status post stereotactic radiotherapy to multiple brain lesions. The patient has been on Decadron for a long time because of his brain metastasis. He was also recently admitted to [facility] with small bowel obstruction and dispense around 10 days in the hospital. He presented to the emergency department yesterday complaining of severe right sided chest pain and shortness of breath. CT angiogram of the chest showed evidence for recurrent pulmonary embolism. The patient has been on treatment to Xarelto for several months except when he was in the hospital for the small bowel obstruction he was on intravenous heparin. His platelets count has been low for the last few weeks. He denied having any bleeding issues. He has no fever or chills. He feels a little bit better today.  Objective: Vital signs in last 24 hours: Temp:  [97.5 F (36.4 C)-98.9 F (37.2 C)] 98 F (36.7 C) (12/11 0336) Pulse Rate:  [82-132] 88 (12/11 0600) Resp:  [11-20] 13 (12/11 0600) BP: (83-108)/(52-81) 103/59 (12/11 0630) SpO2:  [88 %-98 %] 92 % (12/11 0600) Weight:  [188 lb (85.3 kg)-193 lb 12.6 oz (87.9 kg)] 193 lb 12.6 oz (87.9 kg) (12/10 1611)  Intake/Output from previous day: 12/10 0701 - 12/11 0700 In: 1530 [P.O.:520; I.V.:10; IV Piggyback:1000] Out: 850 [Urine:850] Intake/Output this shift: No intake/output data recorded.  General appearance: alert, cooperative, fatigued and no distress Resp: clear to auscultation bilaterally Cardio: regular rate and rhythm, S1, S2 normal, no murmur, click, rub or gallop GI: soft, non-tender; bowel sounds normal; no masses,  no organomegaly Extremities: extremities normal, atraumatic, no cyanosis or edema  Lab Results:   Recent  Labs  06/19/16 1210 06/19/16 1610 06/20/16 0340  WBC 5.7  --  5.6  HGB 10.1*  --  9.8*  HCT 27.9*  --  28.1*  PLT 75* 79* 83*   BMET  Recent Labs  06/19/16 1615 06/20/16 0340  NA 129* 130*  K 3.5 3.4*  CL 97* 98*  CO2 26 25  GLUCOSE 145* 136*  BUN 9 10  CREATININE 0.82 0.80  CALCIUM 8.0* 8.0*    Studies/Results: Dg Chest 2 View  Result Date: 06/19/2016 CLINICAL DATA:  Shortness of breath. Right chest pain at the Port-A-Cath site. Lung cancer on immunotherapy. EXAM: CHEST  2 VIEW COMPARISON:  06/06/2016 chest radiograph. FINDINGS: Right internal jugular Port-A-Cath terminates at the cavoatrial junction. Stable cardiomediastinal silhouette with normal heart size. No pneumothorax. No pleural effusion. There is new patchy consolidation in the anterior right mid lung. Stable hazy bibasilar lung opacities. IMPRESSION: 1. New patchy anterior right midlung consolidation, differential includes infection or drug reaction. 2. Stable mild bibasilar hazy opacities, favor scarring or atelectasis. Electronically Signed   By: Ilona Sorrel M.D.   On: 06/19/2016 12:56   Ct Angio Chest Pe W And/or Wo Contrast  Result Date: 06/19/2016 CLINICAL DATA:  75 year old male with stage IV non-small-cell lung carcinoma diagnosed 05/11/2016, with ongoing immunotherapy, presenting with chest pain and dyspnea. Additional history of multiple myeloma and bone marrow transplant in 2014. EXAM: CT ANGIOGRAPHY CHEST WITH CONTRAST TECHNIQUE: Multidetector CT imaging of the chest was performed using the standard protocol during bolus administration of intravenous contrast. Multiplanar CT image reconstructions and MIPs were obtained to evaluate the vascular anatomy. CONTRAST:  62 cc Isovue  370 IV. COMPARISON:  Chest radiograph from earlier today. 05/05/2016 PET-CT. 04/28/2016 CT chest, abdomen and pelvis. FINDINGS: Cardiovascular: The study is high quality for the evaluation of pulmonary embolism. There are multiple acute  segmental and subsegmental pulmonary emboli in the right upper lobe and right lower lobe. Probable subsegmental acute pulmonary embolus in the left lower lobe. No central pulmonary emboli . Atherosclerotic nonaneurysmal thoracic aorta. Main pulmonary artery diameter 3.4 cm, stable from 04/28/2016, mildly dilated. Normal heart size. No significant pericardial fluid/thickening. Left anterior descending coronary atherosclerosis. Right internal jugular MediPort terminates at the cavoatrial junction. Mediastinum/Nodes: Hypodense 1.0 cm left thyroid lobe nodule, not appreciably changed. Unremarkable esophagus. No axillary adenopathy. Right paratracheal adenopathy measuring up to 1.5 cm (series 4/ image 31), slightly increased from 1.2 cm on 04/28/2016. Subcarinal enlarged 1.2 cm node (series 4/ image 41), stable. Mildly enlarged 1.0 cm left paratracheal node (series 4/ image 33), stable. Right hilar adenopathy measuring up to 1.7 cm (series 4/ image 40), stable. Left hilar adenopathy measuring up to 1.2 cm (series 4/ image 43), previously 0.9 cm, slightly increased . Lungs/Pleura: No pneumothorax. Small right pleural effusion. No left pleural effusion. Central right upper lobe irregular 1.5 x 1.2 cm pulmonary nodule (series 11/ image 37), previously 1.5 x 1.1 cm on 04/28/2016, stable. New 3 mm solid right middle lobe pulmonary nodule (series 11/ image 52). New patchy consolidation and ground-glass attenuation throughout the basilar right upper lobe. New mild patchy peripheral consolidation and ground-glass attenuation in the left upper lobe and bilateral lower lobes. Mild compressive atelectasis in the dependent right lower lobe. Upper abdomen: Unremarkable. Musculoskeletal: No aggressive appearing focal osseous lesions. Marked thoracic spondylosis. Review of the MIP images confirms the above findings. IMPRESSION: 1. Acute segmental and subsegmental pulmonary embolism asymmetrically involving the right lung. 2. Stable  chronic main pulmonary artery dilation, suggesting chronic pulmonary arterial hypertension. Normal heart size. 3. Central right upper lobe 1.5 cm irregular pulmonary nodule is stable and suspected to represent the primary bronchogenic malignancy. 4. Bilateral hilar and mediastinal nodal metastases are stable to mildly increased. 5. Small right pleural effusion. 6. New patchy consolidation and ground-glass attenuation in both lungs, most prominent in the basilar right upper lobe, nonspecific, differential includes multifocal infection, drug reaction and/or pulmonary infarcts. 7. Aortic atherosclerosis.  One vessel coronary atherosclerosis. Critical Value/emergent results were called by telephone at the time of interpretation on 06/19/2016 at 2:02 pm to Dr. Duffy Bruce , who verbally acknowledged these results. Electronically Signed   By: Ilona Sorrel M.D.   On: 06/19/2016 14:03    Medications: I have reviewed the patient's current medications.  CODE STATUS: Full code  Assessment/Plan: 1) stage IV non-small cell lung cancer, adenocarcinoma with PDL 1 expression of 80%. The patient is started the first dose of treatment with immunotherapy with Nat Math (pembrolizumab) last week. He tolerated the treatment well with no significant adverse effects. He has no skin rash or diarrhea. He is expected to start the second cycle of his treatment in 2 weeks. 2) multiple brain metastasis: Status post stereotactic radiotherapy. Currently stable. We will continue to monitor and the patient will continue on 2 mg of Decadron for now. 3) history of multiple myeloma: Status post several chemotherapy regimens as well as peripheral blood autologous stem cell transplant. He was on maintenance Revlimid which was discontinued after his recent diagnosis of lung cancer. We will continue to monitor for now. 4) recurrent pulmonary embolism: This poor be happening during his hospitalization in a patient with  history of malignancy as  well as long term use of Decadron. Once rule out HIT. We would consider the patient for treatment either with Xarelto again or Eliquis. For now we will continue with argatroban. 5) small bowel obstruction: Resolved. 6) thrombocytopenia: Etiology is unclear. We will rule out heparin-induced thrombocytopenia. This could be also secondary to his multiple comorbidities. It is improving. We will monitor for now. Thank you so much for taking good care of Mr. Lovena Le. I will continue to follow up the patient with you and assist in his management.  LOS: 1 day    Jaydian Santana K. 06/20/2016

## 2016-06-20 NOTE — Progress Notes (Signed)
  Echocardiogram 2D Echocardiogram has been performed.  Tresa Res 06/20/2016, 12:26 PM

## 2016-06-20 NOTE — Progress Notes (Signed)
PROGRESS NOTE    [name]  KPT:[id number] DOB: [birth year] DOA: 06/19/2016 PCP: [name], MD    Brief Narrative:  75 year old male with history of metastatic lung cancer, pulmonary embolism on xarelto who was recently discharged on 06/13/16 after treated for small bowel obstruction presented today to ED with right-sided chest pain and acute shortness of breath. History was obtained from the patient who reported that he woke up this morning feeling severe 10/10 sharp stabbing chest pain in his right side of the chest and shortness of breath, difficulty catching his breath today. He also felt dizzy. No coughing, fevers or chills, nausea or vomiting, diaphoresis. Per patient, he was admitted with small bowel obstruction and was on heparin drip while inpatient and had resumed xarelto during the hospitalization.  Assessment & Plan:   Principal Problem:   Pulmonary emboli (HCC) Active Problems:   Acute pulmonary embolism (HCC)   Malignant neoplasm of upper lobe of right lung (HCC)    Acute right lung Pulmonary emboli ([place]): In the setting of thrombocytopenia, recent heparin use  - Patient was admitted with small bowel bowel obstruction during the previous admission and was placed on heparin drip, xarelto was held. On 11/26, PLT count was 181 K  and subsequently trended down to 94K on 11/30  and 68K on 12/6 when patient was discharged. No HIT panel available. - Sent HIT panel, SRA panel, DIC panel, LDH, haptoglobin, (2 g drop in hemoglobin from discharge 12.3 -> 10.1 on admission) - Patient is continued on argatroban per pharmacy. - Oncology is following. Discussed case personally with Dr. [name], who recommends transition to Xarelto when/if HIT panel is neg - 2d echo ordered, findings reviewed with evidence suggestive of possible right heart strain     - Vitals reviewed. Blood pressure appears to be near baseline. Not notably tachycardic  Thrombocytopenia - Plan per above - HIT  panel pending - Repeat CBC in AM  Metastatic stage 4 non-small cell Lung cancer - Oncology following - Plan to start second cycle of chemo in 2 weeks time  Hyponatremia -Continue DDAVP  - sodium slightly higher today. Labs reviewed - Repeat bmet in AM  Hypokalemia - Low this AM - Will replace  DVT prophylaxis: On therapeutic anticoagulation Code Status: full Family Communication: Pt in room Disposition Plan: Uncertain at this time  Consultants:   Oncology  Procedures:     Antimicrobials: Anti-infectives    None       Subjective: No complaints at this time. Denies sob  Objective: Vitals:   06/20/16 0800 06/20/16 0900 06/20/16 1100 06/20/16 1200  BP: 97/63 104/64 (!) 99/57 (!) 102/57  Pulse: 94 (!) 102 99 100  Resp: _0 Temp: 98.1 F (36.7 C)     TempSrc: Oral     SpO2: 93% 92% 91% 92%  Weight:      Height:        Intake/Output Summary (Last 24 hours) at 06/20/16 1456 Last data filed at 06/20/16 1249  Gross per 24 hour  Intake             1530 ml  Output             1200 ml  Net              330 ml   Filed Weights   06/19/16 1111 06/19/16 1611  Weight: 85.3 kg (188 lb) 87.9 kg (193 lb 12.6 oz)    Examination:  General exam: Appears  calm and comfortable  Respiratory system: Clear to auscultation. Respiratory effort normal. Cardiovascular system: S1 & S2 heard, RRR. Gastrointestinal system: Abdomen is nondistended, soft and nontender. No organomegaly or masses felt. Normal bowel sounds heard. Central nervous system: Alert and oriented. No focal neurological deficits. Extremities: Symmetric 5 x 5 power. Skin: No rashes, lesions Psychiatry: Judgement and insight appear normal. Mood & affect appropriate.   Data Reviewed: I have personally reviewed following labs and imaging studies  CBC:  Recent Labs Lab 06/15/16 0852 06/19/16 1210 06/19/16 1610 06/20/16 0340  WBC 8.5 5.7  --  5.6  NEUTROABS 7.7* 5.3  --   --   HGB 12.3*  10.1*  --  9.8*  HCT 36.2* 27.9*  --  28.1*  MCV 94.5 89.4  --  93.7  PLT 68* 75* 79* 83*   Basic Metabolic Panel:  Recent Labs Lab 06/15/16 0852 06/19/16 1210 06/19/16 1615 06/20/16 0340  NA 132* 126* 129* 130*  K 4.1 3.3* 3.5 3.4*  CL  --  93* 97* 98*  CO2 _0 GLUCOSE 101 93 145* 136*  BUN 19.0 _1 CREATININE 0.8 0.82 0.82 0.80  CALCIUM 8.5 7.9* 8.0* 8.0*   GFR: Estimated Creatinine Clearance: 87.6 mL/min (by C-G formula based on SCr of 0.8 mg/dL). Liver Function Tests:  Recent Labs Lab 06/15/16 0852 06/19/16 1210 06/19/16 1615  AST _2 ALT _3 ALKPHOS 77 65 70  BILITOT 0.99 0.9 1.0  PROT 5.6* 5.2* 5.4*  ALBUMIN 2.8* 2.6* 2.7*   No results for input(s): LIPASE, AMYLASE in the last 168 hours. No results for input(s): AMMONIA in the last 168 hours. Coagulation Profile:  Recent Labs Lab 06/19/16 1610 06/20/16 0340  INR 1.31 1.06   Cardiac Enzymes: No results for input(s): CKTOTAL, CKMB, CKMBINDEX, TROPONINI in the last 168 hours. BNP (last 3 results) No results for input(s): PROBNP in the last 8760 hours. HbA1C: No results for input(s): HGBA1C in the last 72 hours. CBG: No results for input(s): GLUCAP in the last 168 hours. Lipid Profile: No results for input(s): CHOL, HDL, LDLCALC, TRIG, CHOLHDL, LDLDIRECT in the last 72 hours. Thyroid Function Tests: No results for input(s): TSH, T4TOTAL, FREET4, T3FREE, THYROIDAB in the last 72 hours. Anemia Panel: No results for input(s): VITAMINB12, FOLATE, FERRITIN, TIBC, IRON, RETICCTPCT in the last 72 hours. Sepsis Labs: No results for input(s): PROCALCITON, LATICACIDVEN in the last 168 hours.  Recent Results (from the past 240 hour(s))  MRSA PCR Screening     Status: None   Collection Time: 06/19/16  4:10 PM  Result Value Ref Range Status   MRSA by PCR NEGATIVE NEGATIVE Final    Comment:        The GeneXpert MRSA Assay (FDA approved for NASAL specimens only), is one component of  a comprehensive MRSA colonization surveillance program. It is not intended to diagnose MRSA infection nor to guide or monitor treatment for MRSA infections.      Radiology Studies: Dg Chest 2 View  Result Date: 06/19/2016 CLINICAL DATA:  Shortness of breath. Right chest pain at the Port-A-Cath site. Lung cancer on immunotherapy. EXAM: CHEST  2 VIEW COMPARISON:  06/06/2016 chest radiograph. FINDINGS: Right internal jugular Port-A-Cath terminates at the cavoatrial junction. Stable cardiomediastinal silhouette with normal heart size. No pneumothorax. No pleural effusion. There is new patchy consolidation in the anterior right mid lung. Stable hazy bibasilar lung opacities. IMPRESSION: 1. New patchy anterior right midlung consolidation,  differential includes infection or drug reaction. 2. Stable mild bibasilar hazy opacities, favor scarring or atelectasis. Electronically Signed   By: Ilona Sorrel M.D.   On: 06/19/2016 12:56   Ct Angio Chest Pe W And/or Wo Contrast  Result Date: 06/19/2016 CLINICAL DATA:  75 year old male with stage IV non-small-cell lung carcinoma diagnosed 05/11/2016, with ongoing immunotherapy, presenting with chest pain and dyspnea. Additional history of multiple myeloma and bone marrow transplant in 2014. EXAM: CT ANGIOGRAPHY CHEST WITH CONTRAST TECHNIQUE: Multidetector CT imaging of the chest was performed using the standard protocol during bolus administration of intravenous contrast. Multiplanar CT image reconstructions and MIPs were obtained to evaluate the vascular anatomy. CONTRAST:  62 cc Isovue 370 IV. COMPARISON:  Chest radiograph from earlier today. 05/05/2016 PET-CT. 04/28/2016 CT chest, abdomen and pelvis. FINDINGS: Cardiovascular: The study is high quality for the evaluation of pulmonary embolism. There are multiple acute segmental and subsegmental pulmonary emboli in the right upper lobe and right lower lobe. Probable subsegmental acute pulmonary embolus in the left  lower lobe. No central pulmonary emboli . Atherosclerotic nonaneurysmal thoracic aorta. Main pulmonary artery diameter 3.4 cm, stable from 04/28/2016, mildly dilated. Normal heart size. No significant pericardial fluid/thickening. Left anterior descending coronary atherosclerosis. Right internal jugular MediPort terminates at the cavoatrial junction. Mediastinum/Nodes: Hypodense 1.0 cm left thyroid lobe nodule, not appreciably changed. Unremarkable esophagus. No axillary adenopathy. Right paratracheal adenopathy measuring up to 1.5 cm (series 4/ image 31), slightly increased from 1.2 cm on 04/28/2016. Subcarinal enlarged 1.2 cm node (series 4/ image 41), stable. Mildly enlarged 1.0 cm left paratracheal node (series 4/ image 33), stable. Right hilar adenopathy measuring up to 1.7 cm (series 4/ image 40), stable. Left hilar adenopathy measuring up to 1.2 cm (series 4/ image 43), previously 0.9 cm, slightly increased . Lungs/Pleura: No pneumothorax. Small right pleural effusion. No left pleural effusion. Central right upper lobe irregular 1.5 x 1.2 cm pulmonary nodule (series 11/ image 37), previously 1.5 x 1.1 cm on 04/28/2016, stable. New 3 mm solid right middle lobe pulmonary nodule (series 11/ image 52). New patchy consolidation and ground-glass attenuation throughout the basilar right upper lobe. New mild patchy peripheral consolidation and ground-glass attenuation in the left upper lobe and bilateral lower lobes. Mild compressive atelectasis in the dependent right lower lobe. Upper abdomen: Unremarkable. Musculoskeletal: No aggressive appearing focal osseous lesions. Marked thoracic spondylosis. Review of the MIP images confirms the above findings. IMPRESSION: 1. Acute segmental and subsegmental pulmonary embolism asymmetrically involving the right lung. 2. Stable chronic main pulmonary artery dilation, suggesting chronic pulmonary arterial hypertension. Normal heart size. 3. Central right upper lobe 1.5 cm  irregular pulmonary nodule is stable and suspected to represent the primary bronchogenic malignancy. 4. Bilateral hilar and mediastinal nodal metastases are stable to mildly increased. 5. Small right pleural effusion. 6. New patchy consolidation and ground-glass attenuation in both lungs, most prominent in the basilar right upper lobe, nonspecific, differential includes multifocal infection, drug reaction and/or pulmonary infarcts. 7. Aortic atherosclerosis.  One vessel coronary atherosclerosis. Critical Value/emergent results were called by telephone at the time of interpretation on 06/19/2016 at 2:02 pm to Dr. Duffy Bruce , who verbally acknowledged these results. Electronically Signed   By: Ilona Sorrel M.D.   On: 06/19/2016 14:03    Scheduled Meds: . acidophilus  1 capsule Oral Daily  . cholecalciferol  1,000 Units Oral Daily  . desmopressin  0.1 mg Oral QHS  . dexamethasone  2 mg Oral Daily  . mouth rinse  15  mL Mouth Rinse BID  . multivitamin with minerals  1 tablet Oral Daily  . omega-3 acid ethyl esters  1 g Oral Daily  . pantoprazole  40 mg Oral BID  . sodium chloride  1,000 mL Intravenous Once  . tamsulosin  0.8 mg Oral PC supper  . ascorbic acid  500 mg Oral BID   Continuous Infusions: . argatroban 0.8 mcg/kg/min (06/20/16 0950)     LOS: 1 day   CHIU, Orpah Melter, MD [facility] Pager [phone]  If 7PM-7AM, please contact night-coverage [email or web address] Password [email or web address] 06/20/2016, 2:56 PM

## 2016-06-21 DIAGNOSIS — E440 Moderate protein-calorie malnutrition: Secondary | ICD-10-CM

## 2016-06-21 LAB — CBC
HEMATOCRIT: 23.8 % — AB (ref 39.0–52.0)
HEMOGLOBIN: 8.5 g/dL — AB (ref 13.0–17.0)
MCH: 32.4 pg (ref 26.0–34.0)
MCHC: 35.7 g/dL (ref 30.0–36.0)
MCV: 90.8 fL (ref 78.0–100.0)
Platelets: 71 10*3/uL — ABNORMAL LOW (ref 150–400)
RBC: 2.62 MIL/uL — ABNORMAL LOW (ref 4.22–5.81)
RDW: 16.6 % — AB (ref 11.5–15.5)
WBC: 5.6 10*3/uL (ref 4.0–10.5)

## 2016-06-21 LAB — BASIC METABOLIC PANEL
ANION GAP: 5 (ref 5–15)
BUN: 12 mg/dL (ref 6–20)
CHLORIDE: 99 mmol/L — AB (ref 101–111)
CO2: 26 mmol/L (ref 22–32)
CREATININE: 0.69 mg/dL (ref 0.61–1.24)
Calcium: 7.6 mg/dL — ABNORMAL LOW (ref 8.9–10.3)
GFR calc Af Amer: >60 mL/min (ref >60)
GFR calc non Af Amer: >60 mL/min (ref >60)
GLUCOSE: 139 mg/dL — AB (ref 65–99)
Potassium: 3.6 mmol/L (ref 3.5–5.1)
Sodium: 130 mmol/L — ABNORMAL LOW (ref 135–145)

## 2016-06-21 LAB — HEPARIN INDUCED PLATELET AB (HIT ANTIBODY): HEPARIN INDUCED PLT AB: 0.578 {OD_unit} — AB (ref 0.000–0.400)

## 2016-06-21 LAB — HAPTOGLOBIN: Haptoglobin: 294 mg/dL — ABNORMAL HIGH (ref 34–200)

## 2016-06-21 LAB — APTT: aPTT: 62 seconds — ABNORMAL HIGH (ref 24–36)

## 2016-06-21 MED ORDER — KETOROLAC TROMETHAMINE 15 MG/ML IJ SOLN
15.0000 mg | Freq: Four times a day (QID) | INTRAMUSCULAR | Status: DC | PRN
  Administered 2016-06-21 – 2016-06-22 (×3): 15 mg via INTRAVENOUS
  Filled 2016-06-21 (×3): qty 1

## 2016-06-21 MED ORDER — GUAIFENESIN-DM 100-10 MG/5ML PO SYRP
5.0000 mL | ORAL_SOLUTION | ORAL | Status: DC | PRN
  Administered 2016-06-21 – 2016-06-22 (×2): 5 mL via ORAL
  Filled 2016-06-21 (×2): qty 10

## 2016-06-21 NOTE — Progress Notes (Signed)
ANTICOAGULATION CONSULT NOTE - follow-up Consult  Pharmacy Consult for Argatroban Indication: pulmonary embolus  Allergies  Allergen Reactions  . Tramadol Palpitations    "Made my chest feel tight"  . Amoxicillin     REACTION: rash  . Penicillins Rash    Has patient had a PCN reaction causing immediate rash, facial/tongue/throat swelling, SOB or lightheadedness with hypotension: unknown Has patient had a PCN reaction causing severe rash involving mucus membranes or skin necrosis: unknown Has patient had a PCN reaction that required hospitalization: no  Has patient had a PCN reaction occurring within the last 10 years: no  If all of the above answers are "NO", then may proceed with Cephalosporin use.     Patient Measurements: Height: '5\' 9"'$  (175.3 cm) Weight: 193 lb 12.6 oz (87.9 kg) IBW/kg (Calculated) : 70.7   Vital Signs: Temp: 98.1 F (36.7 C) (12/12 0515) Temp Source: Oral (12/12 0515) BP: 99/56 (12/12 0515) Pulse Rate: 78 (12/12 0515)  Labs:  Recent Labs  06/19/16 1210  06/19/16 1610 06/19/16 1615 06/20/16 0340 06/20/16 1205 06/20/16 1523 06/20/16 1831 06/21/16 0501  HGB 10.1*  --   --   --  9.8*  --   --   --  8.5*  HCT 27.9*  --   --   --  28.1*  --   --   --  23.8*  PLT 75*  --  79*  --  83*  --   --   --  71*  APTT  --   < > 34  --  27 70* 52* 74*  --   LABPROT  --   --  16.3*  --  13.8  --   --   --   --   INR  --   --  1.31  --  1.06  --   --   --   --   CREATININE 0.82  --   --  0.82 0.80  --   --   --  0.69  < > = values in this interval not displayed.  Estimated Creatinine Clearance: 87.6 mL/min (by C-G formula based on SCr of 0.69 mg/dL).   Assessment: 39 yoM admitted on 12/10 with PE and pharmacy is consulted to dose Heparin IV.  PMH is significant for metastatic lung cancer and chronic anticoagulation with Xarelto for history of PE/DVT.  Recent admission 11/26-12/4 for SBO.  During that admission, Xarelto was held on 11/26 for NPO status and  in case of surgical intervention.  He was started on Heparin IV on 11/28, but during the course of heparin, his Hgb trended down (14.7 > 12.9 > 11.5 > 10.5 > 9.5), and Plt trended down (baseline Plt 120-170's, admission 181 > 149 > 120 > 94 > 72).  No bleeding or complications were noted.  No evidence of HIT, but HIT panel not ordered.  Heparin infusion was active from 11/28-12/1, but was held for low platelets on 12/1.  Xarelto was resumed on 12/3 and he was discharged on 12/4.  Last dose of Xarelto '20mg'$  PO given on 12/10 10:00 AM.  Today, 06/21/2016  CBC: Hgb trending down to 8.5 today, pltc slightly improved yesterday but further decreased to 71 today.  Renal: SCr WNL  APTT 62 sec, remains therapeutic   Goal of Therapy:  aPTT 50-90 seconds Monitor platelets by anticoagulation protocol: Yes   Plan:   Continue argatroban at 0.8 mcg/kg/min (4.108m/hr)  Dose was reduced from 151m/kg/min empirically  Check aPTT daily.  Daily CBC  F/u HIT panel and SRA results  F/u long-term anticoagulation plans  Gretta Arab PharmD, BCPS Pager [phone] 06/21/2016 7:10 AM

## 2016-06-21 NOTE — Progress Notes (Addendum)
PROGRESS NOTE    [name]  UEA:[id number] DOB: 1941-02-23 DOA: 06/19/2016 PCP: [name], MD    Brief Narrative:  75 year old male with history of metastatic lung cancer, pulmonary embolism on xarelto who was recently discharged on 06/13/16 after treated for small bowel obstruction presented today to ED with right-sided chest pain and acute shortness of breath. History was obtained from the patient who reported that he woke up this morning feeling severe 10/10 sharp stabbing chest pain in his right side of the chest and shortness of breath, difficulty catching his breath today. He also felt dizzy. No coughing, fevers or chills, nausea or vomiting, diaphoresis. Per patient, he was admitted with small bowel obstruction and was on heparin drip while inpatient and had resumed xarelto during the hospitalization.  During this hospital course, patient was continued on argatroban. Oncology was consulted with recommendation to follow up on HIT panel. HIT ab was elevated. Discussed with Dr. [name] who will re-evaluate patient on 12/13  Assessment & Plan:   Principal Problem:   Pulmonary emboli [facility]) Active Problems:   Pulmonary embolus (HCC)   Malignant neoplasm of upper lobe of right lung (HCC)   Malnutrition of moderate degree    Acute right lung Pulmonary emboli ([place]): In the setting of thrombocytopenia, recent heparin use  - Patient was admitted with small bowel bowel obstruction during the previous admission and was placed on heparin drip, xarelto was held. On 11/26, PLT count was 181 K  and subsequently trended down to 94K on 11/30  and 68K on 12/6 when patient was discharged. No HIT panel available. - Sent HIT panel, SRA panel, DIC panel, LDH, haptoglobin, (2 g drop in hemoglobin from discharge 12.3 -> 10.1 on admission) - Patient is continued on argatroban per pharmacy. - Oncology is following. Discussed case personally with Dr. [name], who recommends transition to Xarelto when/if  HIT panel is neg - 2d echo ordered, findings reviewed with evidence suggestive of possible right heart strain     - Vitals remain stable at present - HIT ab still pending at the time of this dictation. Continue to follow  Thrombocytopenia - Plan per above - HIT panel pending - Repeat CBC in AM  Metastatic stage 4 non-small cell Lung cancer - Oncology following - Plan to start second cycle of chemo in 2 weeks time  Hyponatremia -Continue DDAVP  - stable. Labs reviewed  Hypokalemia - replaced  DVT prophylaxis: On therapeutic anticoagulation Code Status: full Family Communication: Pt in room Disposition Plan: Uncertain at this time  Consultants:   Oncology  Procedures:     Antimicrobials: Anti-infectives    None      Subjective: Eager to go home  Objective: Vitals:   06/20/16 1300 06/20/16 2205 06/21/16 0515 06/21/16 1345  BP: 99/66 104/68 (!) 99/56 95/62  Pulse: 93 89 78 95  Resp: '18 18 18 16  '$ Temp: 97.6 F (36.4 C) 98.5 F (36.9 C) 98.1 F (36.7 C) 98.5 F (36.9 C)  TempSrc: Oral Oral Oral Oral  SpO2: 98% 93% 93% 98%  Weight:      Height:        Intake/Output Summary (Last 24 hours) at 06/21/16 1430 Last data filed at 06/21/16 1002  Gross per 24 hour  Intake            814.7 ml  Output                0 ml  Net  814.7 ml   Filed Weights   06/19/16 1111 06/19/16 1611  Weight: 85.3 kg (188 lb) 87.9 kg (193 lb 12.6 oz)    Examination:  General exam: laying in bed, in nad Respiratory system: Clear to auscultation. Respiratory effort normal. Cardiovascular system: S1 & S2 heard, RRR. Gastrointestinal system: Abdomen is nondistended, soft and nontender. No organomegaly or masses felt. Normal bowel sounds heard. Central nervous system: Alert and oriented. No focal neurological deficits. Extremities: Symmetric 5 x 5 power. Skin: No rashes, lesions Psychiatry: Judgement and insight appear normal. Mood & affect appropriate.   Data  Reviewed: I have personally reviewed following labs and imaging studies  CBC:  Recent Labs Lab 06/15/16 0852 06/19/16 1210 06/19/16 1610 06/20/16 0340 06/21/16 0501  WBC 8.5 5.7  --  5.6 5.6  NEUTROABS 7.7* 5.3  --   --   --   HGB 12.3* 10.1*  --  9.8* 8.5*  HCT 36.2* 27.9*  --  28.1* 23.8*  MCV 94.5 89.4  --  93.7 90.8  PLT 68* 75* 79* 83* 71*   Basic Metabolic Panel:  Recent Labs Lab 06/15/16 0852 06/19/16 1210 06/19/16 1615 06/20/16 0340 06/21/16 0501  NA 132* 126* 129* 130* 130*  K 4.1 3.3* 3.5 3.4* 3.6  CL  --  93* 97* 98* 99*  CO2 '23 26 26 25 26  '$ GLUCOSE 101 93 145* 136* 139*  BUN 19.0 '9 9 10 12  '$ CREATININE 0.8 0.82 0.82 0.80 0.69  CALCIUM 8.5 7.9* 8.0* 8.0* 7.6*   GFR: Estimated Creatinine Clearance: 87.6 mL/min (by C-G formula based on SCr of 0.69 mg/dL). Liver Function Tests:  Recent Labs Lab 06/15/16 0852 06/19/16 1210 06/19/16 1615  AST '10 15 17  '$ ALT '27 21 21  '$ ALKPHOS 77 65 70  BILITOT 0.99 0.9 1.0  PROT 5.6* 5.2* 5.4*  ALBUMIN 2.8* 2.6* 2.7*   No results for input(s): LIPASE, AMYLASE in the last 168 hours. No results for input(s): AMMONIA in the last 168 hours. Coagulation Profile:  Recent Labs Lab 06/19/16 1610 06/20/16 0340  INR 1.31 1.06   Cardiac Enzymes: No results for input(s): CKTOTAL, CKMB, CKMBINDEX, TROPONINI in the last 168 hours. BNP (last 3 results) No results for input(s): PROBNP in the last 8760 hours. HbA1C: No results for input(s): HGBA1C in the last 72 hours. CBG: No results for input(s): GLUCAP in the last 168 hours. Lipid Profile: No results for input(s): CHOL, HDL, LDLCALC, TRIG, CHOLHDL, LDLDIRECT in the last 72 hours. Thyroid Function Tests: No results for input(s): TSH, T4TOTAL, FREET4, T3FREE, THYROIDAB in the last 72 hours. Anemia Panel: No results for input(s): VITAMINB12, FOLATE, FERRITIN, TIBC, IRON, RETICCTPCT in the last 72 hours. Sepsis Labs: No results for input(s): PROCALCITON, LATICACIDVEN in  the last 168 hours.  Recent Results (from the past 240 hour(s))  MRSA PCR Screening     Status: None   Collection Time: 06/19/16  4:10 PM  Result Value Ref Range Status   MRSA by PCR NEGATIVE NEGATIVE Final    Comment:        The GeneXpert MRSA Assay (FDA approved for NASAL specimens only), is one component of a comprehensive MRSA colonization surveillance program. It is not intended to diagnose MRSA infection nor to guide or monitor treatment for MRSA infections.      Radiology Studies: No results found.  Scheduled Meds: . acidophilus  1 capsule Oral Daily  . cholecalciferol  1,000 Units Oral Daily  . desmopressin  0.1 mg Oral QHS  .  dexamethasone  2 mg Oral Daily  . mouth rinse  15 mL Mouth Rinse BID  . multivitamin with minerals  1 tablet Oral Daily  . omega-3 acid ethyl esters  1 g Oral Daily  . pantoprazole  40 mg Oral BID  . sodium chloride  1,000 mL Intravenous Once  . tamsulosin  0.8 mg Oral PC supper  . ascorbic acid  500 mg Oral BID   Continuous Infusions: . argatroban 0.8 mcg/kg/min (06/21/16 0119)     LOS: 2 days   [name], Orpah Melter, MD [facility] Pager [phone]  If 7PM-7AM, please contact night-coverage [email or web address] Password [email or web address] 06/21/2016, 2:30 PM

## 2016-06-22 LAB — CBC
HEMATOCRIT: 22.5 % — AB (ref 39.0–52.0)
HEMOGLOBIN: 7.8 g/dL — AB (ref 13.0–17.0)
MCH: 32.6 pg (ref 26.0–34.0)
MCHC: 34.7 g/dL (ref 30.0–36.0)
MCV: 94.1 fL (ref 78.0–100.0)
Platelets: 70 10*3/uL — ABNORMAL LOW (ref 150–400)
RBC: 2.39 MIL/uL — AB (ref 4.22–5.81)
RDW: 16.9 % — ABNORMAL HIGH (ref 11.5–15.5)
WBC: 5 10*3/uL (ref 4.0–10.5)

## 2016-06-22 LAB — SEROTONIN RELEASE ASSAY (SRA)
SRA 100IU/mL UFH Ser-aCnc: 2 % (ref 0–20)
SRA, LOW DOSE HEPARIN: 2 % (ref 0–20)

## 2016-06-22 LAB — APTT: APTT: 75 s — AB (ref 24–36)

## 2016-06-22 MED ORDER — APIXABAN 5 MG PO TABS: ORAL_TABLET | ORAL | 0 refills | Status: DC

## 2016-06-22 MED ORDER — HEPARIN SOD (PORK) LOCK FLUSH 100 UNIT/ML IV SOLN
500.0000 [IU] | INTRAVENOUS | Status: AC | PRN
  Administered 2016-06-22: 500 [IU]

## 2016-06-22 MED ORDER — APIXABAN 5 MG PO TABS: 5.0000 mg | ORAL_TABLET | Freq: Two times a day (BID) | ORAL | Status: DC

## 2016-06-22 MED ORDER — APIXABAN 5 MG PO TABS
10.0000 mg | ORAL_TABLET | Freq: Two times a day (BID) | ORAL | Status: DC
  Administered 2016-06-22: 10 mg via ORAL
  Filled 2016-06-22: qty 2

## 2016-06-22 MED ORDER — LEVOFLOXACIN 500 MG PO TABS
500.0000 mg | ORAL_TABLET | Freq: Every day | ORAL | Status: DC
  Administered 2016-06-22: 500 mg via ORAL
  Filled 2016-06-22: qty 1

## 2016-06-22 MED ORDER — OXYCODONE HCL 5 MG PO TABS: 5.0000 mg | ORAL_TABLET | Freq: Four times a day (QID) | ORAL | 0 refills | Status: DC | PRN

## 2016-06-22 MED ORDER — LEVOFLOXACIN 500 MG PO TABS: 500.0000 mg | ORAL_TABLET | Freq: Every day | ORAL | 0 refills | Status: DC

## 2016-06-22 MED ORDER — HYDROMORPHONE HCL 2 MG/ML IJ SOLN
0.5000 mg | INTRAMUSCULAR | Status: DC | PRN
  Administered 2016-06-22 (×2): 0.5 mg via INTRAVENOUS
  Filled 2016-06-22 (×2): qty 1

## 2016-06-22 NOTE — Discharge Summary (Signed)
Physician Discharge Summary  DHAIRYA [name] RWE:[id number] DOB: May 10, 1941 DOA: 06/19/2016  PCP: [name], MD  Admit date: 06/19/2016 Discharge date: 06/22/2016  Admitted From: Home Disposition: Home   Recommendations for Outpatient Follow-up:  1. Follow up with Dr. [name] 12/19.  2. Please obtain CBC to monitor cell counts, platelets stable at 70 on 12/13.  3. Monitor respiratory function: No hypoxemia on discharge.  4. Pleuritic chest pain stable, gave oxycodone/APAP 5/3646m #15 at discharge.  5. Monitor bleeding, started eliquis for acute PE. Will transition 176m> 46m24mn on 12/20 (after 7 days).   Home Health: None Equipment/Devices: None Discharge Condition: Stable CODE STATUS: Full Diet recommendation: Regular  Brief/Interim Summary: [age]ar old male with history of metastatic lung cancer, pulmonary embolism on xarelto who was recently discharged on 06/13/16 after treated for small bowel obstruction presented today to ED with right-sided chest pain and acute shortness of breath. He had recently been admitted with SBO and was on heparin drip while inpatient and had resumed xarelto during the hospitalization. He was found to have acute right lung pulmonary emboli. During this hospital course, patient was continued on argatroban and oncology was consulted with recommendation to follow up on HIT panel. HIT ab was elevated but SRA negative (i.e. not HIT). Dr. MohJulien Nordmannaluated the patient and recommended starting eliquis. He is discharged on room air with follow up scheduled 12/19.   Discharge Diagnoses:  Principal Problem:   Pulmonary emboli (HCC) Active Problems:   Pulmonary embolus (HCC)   Malignant neoplasm of upper lobe of right lung (HCC)   Malnutrition of moderate degree  Acute right lungPulmonary emboli (HCCHectorIn the setting of thrombocytopenia, recent heparin use  - Patient was admitted with small bowel bowel obstruction during the previous admission and was placed on  heparin drip, xarelto was held. On 11/26, PLT count was 181K and subsequently trended down to 94K on 11/30 and 68K on 12/6 when patient was discharged. Discharge platelets = 70k.  - Oncology is following, Dr. MohJulien Nordmanncommends starting eliquis which was started on day of discharge per pharmacy dosing.  - 2d echo ordered, findings reviewed with evidence suggestive of possible right heart strain - Vitals remain stable at present  Thrombocytopenia: Stable.  - Plan per above - SRA negative - Repeat CBC at follow up  Metastatic stage 4 non-small cell Lung cancer - Oncology following - Plan to start second cycle of chemo in 2 weeks time  Hyponatremia -Continued DDAVP  - stable, at 130.   Hypokalemia - replaced and resolved  Discharge Instructions Discharge Instructions    Call MD for:  persistant nausea and vomiting    Complete by:  As directed    Discharge instructions    Complete by:  As directed    You were admitted for pulmonary emboli (blood clots in your lungs) that are being treated with blood thinner. You are stable for discharge with the following recommendations:  - Take eliquis as directed to treat blood clots. You will take 69m7mice daily for 7 days (including 12/13), then on 12/20 start taking 46mg 48mce daily until you follow up. This will be a long term medication for you.  - STOP xarelto - Take levaquin, an antibiotic, once daily for 7 days.  - Follow up with Dr. MohamJulien Nordmanncheduled or seek medical attention earlier if you notice any worsening symptoms, trouble breathing, or unusual bleeding or bruising.   Increase activity slowly    Complete by:  As directed  Medication List    STOP taking these medications   XARELTO 20 MG Tabs tablet Generic drug:  rivaroxaban     TAKE these medications   acetaminophen 500 MG tablet Commonly known as:  TYLENOL Take 500 mg by mouth every 6 (six) hours as needed for moderate pain or headache.   ALIGN 4 MG  Caps Take 1 capsule by mouth daily. Reported on 09/10/2015   apixaban 5 MG Tabs tablet Commonly known as:  ELIQUIS Take 2 tabs (46m) po BID for 7 days, then take 1 tab (552m po BID   ascorbic acid 500 MG tablet Commonly known as:  VITAMIN C Take 500 mg by mouth 2 (two) times daily.   cholecalciferol 1000 units tablet Commonly known as:  VITAMIN D Take 1,000 Units by mouth daily.   desmopressin 0.1 MG tablet Commonly known as:  DDAVP Take 1 tablet (0.1 mg total) by mouth at bedtime.   dexamethasone 4 MG tablet Commonly known as:  DECADRON Take 1 tablet (4 mg total) by mouth 2 (two) times daily. What changed:  how much to take  when to take this   fish oil-omega-3 fatty acids 1000 MG capsule Take 1 g by mouth daily.   furosemide 20 MG tablet Commonly known as:  LASIX Take 1 tablet (20 mg total) by mouth daily. What changed:  when to take this  reasons to take this   levofloxacin 500 MG tablet Commonly known as:  LEVAQUIN Take 1 tablet (500 mg total) by mouth daily. Start taking on:  06/23/2016   LORazepam 0.5 MG tablet Commonly known as:  ATIVAN Take 1 tab PO Q 6 hours PRN nausea or anxiety   methocarbamol 500 MG tablet Commonly known as:  ROBAXIN TAKE 1 TO 2 TABLETS BY MOUTH EVERY 6 HOURS AS NEEDED FOR MUSCLE SPASMS   multivitamin with minerals Tabs tablet Take 1 tablet by mouth daily.   ondansetron 8 MG tablet Commonly known as:  ZOFRAN Take 1 tablet (8 mg total) by mouth every 8 (eight) hours as needed for nausea or vomiting.   oxyCODONE 5 MG immediate release tablet Commonly known as:  ROXICODONE Take 1 tablet (5 mg total) by mouth every 6 (six) hours as needed for severe pain. What changed:  how much to take  how to take this  when to take this  reasons to take this  additional instructions   pantoprazole 40 MG tablet Commonly known as:  PROTONIX TAKE 1 TABLET BY MOUTH TWO  TIMES DAILY   polyethylene glycol packet Commonly known as:   MIRALAX Take 17 g by mouth daily as needed for mild constipation.   prochlorperazine 10 MG tablet Commonly known as:  COMPAZINE Take 1 tablet (10 mg total) by mouth every 6 (six) hours as needed. What changed:  reasons to take this   tamsulosin 0.4 MG Caps capsule Commonly known as:  FLOMAX Take 2 capsules (0.8 mg total) by mouth daily after supper.   temazepam 15 MG capsule Commonly known as:  RESTORIL Take 1 capsule (15 mg total) by mouth at bedtime as needed. What changed:  reasons to take this   ZOMETA 4 MG/5ML injection Generic drug:  zolendronic acid Inject 4 mg into the vein every 3 (three) months.       Allergies  Allergen Reactions  . Tramadol Palpitations    "Made my chest feel tight"  . Amoxicillin     REACTION: rash  . Penicillins Rash    Has patient had  a PCN reaction causing immediate rash, facial/tongue/throat swelling, SOB or lightheadedness with hypotension: unknown Has patient had a PCN reaction causing severe rash involving mucus membranes or skin necrosis: unknown Has patient had a PCN reaction that required hospitalization: no  Has patient had a PCN reaction occurring within the last 10 years: no  If all of the above answers are "NO", then may proceed with Cephalosporin use.     Consultations: - Hematology/oncology: Dr. [name]  Procedures/Studies: Dg Chest 2 View  Result Date: 06/19/2016 CLINICAL DATA:  Shortness of breath. Right chest pain at the Port-A-Cath site. Lung cancer on immunotherapy. EXAM: CHEST  2 VIEW COMPARISON:  06/06/2016 chest radiograph. FINDINGS: Right internal jugular Port-A-Cath terminates at the cavoatrial junction. Stable cardiomediastinal silhouette with normal heart size. No pneumothorax. No pleural effusion. There is new patchy consolidation in the anterior right mid lung. Stable hazy bibasilar lung opacities. IMPRESSION: 1. New patchy anterior right midlung consolidation, differential includes infection or drug reaction.  2. Stable mild bibasilar hazy opacities, favor scarring or atelectasis. Electronically Signed   By: Ilona Sorrel M.D.   On: 06/19/2016 12:56   Dg Abd 1 View  Result Date: 06/09/2016 CLINICAL DATA:  Followup Small-bowel obstruction EXAM: ABDOMEN - 1 VIEW COMPARISON:  06/08/2016 FINDINGS: Scattered large and small bowel gas is seen. The small bowel dilatation noted on the prior exam has resolved. No free air is seen. Stable degenerative change of the lumbar spine is noted. Nasogastric catheter is noted within the stomach. IMPRESSION: Resolution of previously seen small bowel obstruction. Electronically Signed   By: Inez Catalina M.D.   On: 06/09/2016 07:29   Ct Angio Chest Pe W And/or Wo Contrast  Result Date: 06/19/2016 CLINICAL DATA:  75 year old male with stage IV non-small-cell lung carcinoma diagnosed 05/11/2016, with ongoing immunotherapy, presenting with chest pain and dyspnea. Additional history of multiple myeloma and bone marrow transplant in 2014. EXAM: CT ANGIOGRAPHY CHEST WITH CONTRAST TECHNIQUE: Multidetector CT imaging of the chest was performed using the standard protocol during bolus administration of intravenous contrast. Multiplanar CT image reconstructions and MIPs were obtained to evaluate the vascular anatomy. CONTRAST:  62 cc Isovue 370 IV. COMPARISON:  Chest radiograph from earlier today. 05/05/2016 PET-CT. 04/28/2016 CT chest, abdomen and pelvis. FINDINGS: Cardiovascular: The study is high quality for the evaluation of pulmonary embolism. There are multiple acute segmental and subsegmental pulmonary emboli in the right upper lobe and right lower lobe. Probable subsegmental acute pulmonary embolus in the left lower lobe. No central pulmonary emboli . Atherosclerotic nonaneurysmal thoracic aorta. Main pulmonary artery diameter 3.4 cm, stable from 04/28/2016, mildly dilated. Normal heart size. No significant pericardial fluid/thickening. Left anterior descending coronary atherosclerosis.  Right internal jugular MediPort terminates at the cavoatrial junction. Mediastinum/Nodes: Hypodense 1.0 cm left thyroid lobe nodule, not appreciably changed. Unremarkable esophagus. No axillary adenopathy. Right paratracheal adenopathy measuring up to 1.5 cm (series 4/ image 31), slightly increased from 1.2 cm on 04/28/2016. Subcarinal enlarged 1.2 cm node (series 4/ image 41), stable. Mildly enlarged 1.0 cm left paratracheal node (series 4/ image 33), stable. Right hilar adenopathy measuring up to 1.7 cm (series 4/ image 40), stable. Left hilar adenopathy measuring up to 1.2 cm (series 4/ image 43), previously 0.9 cm, slightly increased . Lungs/Pleura: No pneumothorax. Small right pleural effusion. No left pleural effusion. Central right upper lobe irregular 1.5 x 1.2 cm pulmonary nodule (series 11/ image 37), previously 1.5 x 1.1 cm on 04/28/2016, stable. New 3 mm solid right middle lobe pulmonary nodule (  series 11/ image 52). New patchy consolidation and ground-glass attenuation throughout the basilar right upper lobe. New mild patchy peripheral consolidation and ground-glass attenuation in the left upper lobe and bilateral lower lobes. Mild compressive atelectasis in the dependent right lower lobe. Upper abdomen: Unremarkable. Musculoskeletal: No aggressive appearing focal osseous lesions. Marked thoracic spondylosis. Review of the MIP images confirms the above findings. IMPRESSION: 1. Acute segmental and subsegmental pulmonary embolism asymmetrically involving the right lung. 2. Stable chronic main pulmonary artery dilation, suggesting chronic pulmonary arterial hypertension. Normal heart size. 3. Central right upper lobe 1.5 cm irregular pulmonary nodule is stable and suspected to represent the primary bronchogenic malignancy. 4. Bilateral hilar and mediastinal nodal metastases are stable to mildly increased. 5. Small right pleural effusion. 6. New patchy consolidation and ground-glass attenuation in both  lungs, most prominent in the basilar right upper lobe, nonspecific, differential includes multifocal infection, drug reaction and/or pulmonary infarcts. 7. Aortic atherosclerosis.  One vessel coronary atherosclerosis. Critical Value/emergent results were called by telephone at the time of interpretation on 06/19/2016 at 2:02 pm to Dr. Duffy Bruce , who verbally acknowledged these results. Electronically Signed   By: Ilona Sorrel M.D.   On: 06/19/2016 14:03   Ct Abdomen Pelvis W Contrast  Result Date: 06/06/2016 CLINICAL DATA:  Increasing abdominal pain for 3 days. Nausea and vomiting. White cell count 9.6. History of lung cancer, melanoma, multiple myeloma, diabetes. EXAM: CT ABDOMEN AND PELVIS WITH CONTRAST TECHNIQUE: Multidetector CT imaging of the abdomen and pelvis was performed using the standard protocol following bolus administration of intravenous contrast. CONTRAST:  100 mL Isovue-300 COMPARISON:  04/28/2016 FINDINGS: Lower chest: Atelectasis in the lung bases. Mildly distended fluid-filled esophagus may indicate reflux. Prominent lymph nodes noted in the inferior right hilum measuring up to 1.4 cm. These are smaller than on the previous study. Hepatobiliary: No focal liver abnormality is seen. No gallstones, gallbladder wall thickening, or biliary dilatation. Pancreas: Unremarkable. No pancreatic ductal dilatation or surrounding inflammatory changes. Spleen: Normal in size without focal abnormality. Adrenals/Urinary Tract: No adrenal gland nodules. Focal scarring in the left kidney. Renal nephrograms are symmetrical. No hydronephrosis or hydroureter. Bladder wall is not thickened. Stomach/Bowel: Moderately fluid distended stomach without wall thickening. Fluid-filled distended small bowel with transition zone in the right lower quadrant representing mid to distal ileum. At the transition zone, there is focal wall thickening and edema suggesting an inflammatory process. Fluid-filled nondistended  colon. Vascular/Lymphatic: Aortic atherosclerosis. No enlarged abdominal or pelvic lymph nodes. Reproductive: Prostate gland is mildly enlarged, measuring 4.3 cm diameter. Other: No abdominal wall hernia or abnormality. No abdominopelvic ascites. Musculoskeletal: Degenerative changes throughout the lumbar spine. Diffuse bone demineralization. No destructive bone lesions. IMPRESSION: Small bowel obstruction with transition zone in the right lower quadrant, likely due to inflammatory stricture. Probable gastroesophageal reflux. Fluid-filled nondistended colon. Right hilar lymphadenopathy is decreased since prior study. Electronically Signed   By: Lucienne Capers M.D.   On: 06/06/2016 04:30   Ct Entero Abd/pelvis W Contast  Result Date: 06/10/2016 CLINICAL DATA:  History of small-bowel obstruction. EXAM: CT ABDOMEN AND PELVIS WITH CONTRAST (ENTEROGRAPHY) TECHNIQUE: Multidetector CT of the abdomen and pelvis during bolus administration of intravenous contrast. Negative oral contrast was given. CONTRAST:  157m ISOVUE-300 IOPAMIDOL (ISOVUE-300) INJECTION 61% COMPARISON:  CT scan 06/06/2016 FINDINGS: Lower chest: Small right pleural effusion and minimal bibasilar atelectasis. No pericardial effusion. The distal esophagus is grossly normal. Hepatobiliary: No focal hepatic lesions or intrahepatic biliary dilatation. There is a fundal nodule associate with the  gallbladder. This was also present on a prior CT scan from 2014. This most likely a small fundal adenomyoma. High attenuation material layering in the gallbladder could be sludge or contrast from vicarious excretion. Pancreas: No mass, inflammation or ductal dilatation. Spleen: Normal size.  No focal lesions. Adrenals/Urinary Tract: The adrenal glands are unremarkable and stable. The kidneys are unremarkable. Stable scarring changes involving the left kidney. No hydronephrosis. Stomach/Bowel: The stomach and duodenum are unremarkable. No findings for small bowel  obstruction or free air. In the right lower quadrant/ right pelvis there is an area of focal bowel wall thickening and slight constriction and enhancement(axial images 68 an 69 and coronal image 47). This is the same area identified on the prior CT scan. It could be a focal area of enteritis or strictured narrowing related to prior inflammatory process. No current small bowel obstruction is identified however. Vascular/Lymphatic: No pathologically enlarged lymph nodes. No evidence of abdominal aortic aneurysm Reproductive: Mildly enlarged prostate gland. Other: No pelvic mass or adenopathy. No free pelvic fluid collections. The bladder is decompressed by Foley catheter. No inguinal mass or adenopathy. Musculoskeletal: No significant bony findings. Diffuse osteoporosis and degenerative changes again noted. IMPRESSION: Focal area of narrowing, wall thickening and enhancement in the distal ileum in the right pelvis correlating with the prior CT scan. This could be a small focus of active inflammation or area of constriction from prior inflammatory process. No current small bowel obstruction. Electronically Signed   By: Marijo Sanes M.D.   On: 06/10/2016 17:00   Dg Abd 2 Views  Result Date: 06/10/2016 CLINICAL DATA:  Small-bowel obstruction.  Follow-up exam . EXAM: ABDOMEN - 2 VIEW COMPARISON:  06/09/2016. FINDINGS: Soft tissue structures are unremarkable. Several loops of distended small bowel are noted on today's exam. Colonic gas pattern is normal. These findings are consistent with partial small bowel obstruction. No free air. PowerPort catheter noted with tip projected over right atrium. Diffuse degenerative changes thoracolumbar spine. Multiple stable mild compression fractures. Degenerative changes both hips. IMPRESSION: Several slightly distended loops small bowel noted on today's exam. Colonic gas pattern is normal. Findings suggest partial small bowel obstruction. No free air. Electronically Signed   By:  Marcello Moores  Register   On: 06/10/2016 08:37   Dg Abd 2 Views  Result Date: 06/08/2016 CLINICAL DATA:  Followup small bowel obstruction. EXAM: ABDOMEN - 2 VIEW COMPARISON:  06/07/2016 FINDINGS: Nasogastric tube remains in place, tip in the stomach in side hole above the diaphragm. There is less dilated small intestine but not complete resolution. There is gas within the colon. No sign of free air. IMPRESSION: Persistent but improved small bowel obstruction pattern. Electronically Signed   By: Nelson Chimes M.D.   On: 06/08/2016 10:28   Dg Abd 2 Views  Result Date: 06/07/2016 CLINICAL DATA:  Abdominal distention. Follow-up small bowel obstruction EXAM: ABDOMEN - 2 VIEW COMPARISON:  06/06/2016 FINDINGS: Increasing small bowel dilatation since prior study. Findings concerning for worsening small bowel obstruction. Gas also noted within the colon. No free air organomegaly. NG tube tip is in the proximal stomach with the side port in the distal esophagus. IMPRESSION: Increasing gaseous distention of small bowel loops with scattered air-fluid levels. Findings compatible with slight worsening in small bowel obstruction since prior study. Electronically Signed   By: Rolm Baptise M.D.   On: 06/07/2016 11:26   Dg Abd Acute W/chest  Result Date: 06/06/2016 CLINICAL DATA:  Nausea, vomiting, diarrhea, and all over abdominal pain for 5 days. Dizziness  and lightheadedness. History of lung and brain cancer, last chemotherapy on Monday. Former smoker. EXAM: DG ABDOMEN ACUTE W/ 1V CHEST COMPARISON:  05/25/2016 FINDINGS: Power port type central venous catheter on the right with tip over the cavoatrial junction region. No pneumothorax. Linear scarring in the lung bases with low lung volumes. Calcified and tortuous aorta. The gaseous distention of mid abdominal small bowel with air-fluid levels. Appearance suggests partial obstruction. Scattered stool seen in the colon. No colonic distention. No free intra-abdominal air. No  radiopaque stones. Degenerative changes throughout the spine and in the hips and shoulders. IMPRESSION: Linear scarring in the lung bases similar to previous study. Gas-filled distended small bowel loops with air-fluid levels suggesting small bowel obstruction. No free air. Electronically Signed   By: Lucienne Capers M.D.   On: 06/06/2016 00:59   Dg Abd Acute W/chest  Result Date: 05/25/2016 CLINICAL DATA:  Nausea and vomiting for 2 weeks. History of multiple myeloma. EXAM: DG ABDOMEN ACUTE W/ 1V CHEST COMPARISON:  PA and lateral chest 05/09/2016 09/10/2015. CT chest, abdomen and pelvis 04/28/2016. FINDINGS: Single-view of the chest again shows a Port-A-Cath in place. Subsegmental atelectasis is present in the bases. No consolidative process, pneumothorax or effusion. Heart size is normal. Aortic atherosclerosis is noted. Two views of the abdomen show no free intraperitoneal air. The bowel gas pattern is nonobstructive. Convex right lumbar scoliosis and multilevel spondylosis are identified. IMPRESSION: No acute abnormality chest or abdomen. Electronically Signed   By: Inge Rise M.D.   On: 05/25/2016 14:53    Subjective: Pt feels much better. No trouble breathing. Chest pain on deep inspiration continues but managed with po medications. Still with dry irritating cough. No fever, chills, sputum.   Discharge Exam: Vitals:   06/21/16 2118 06/22/16 0501  BP: 90/64 (!) 100/56  Pulse: 85 72  Resp: 16 16  Temp: 97.7 F (36.5 C) 97.9 F (36.6 C)   Vitals:   06/21/16 0515 06/21/16 1345 06/21/16 2118 06/22/16 0501  BP: (!) _0 (!) 100/56  Pulse: 78 95 85 72  Resp: _1 Temp: 98.1 F (36.7 C) 98.5 F (36.9 C) 97.7 F (36.5 C) 97.9 F (36.6 C)  TempSrc: Oral Oral Oral Oral  SpO2: 93% 98% 96% 95%  Weight:      Height:       General: Pt is alert, awake, not in acute distress Cardiovascular: RRR, S1/S2 +, no rubs, no gallops Respiratory: CTA bilaterally, no  wheezing, no rhonchi Abdominal: Soft, NT, ND, bowel sounds + Extremities: no edema, no cyanosis  The results of significant diagnostics from this hospitalization (including imaging, microbiology, ancillary and laboratory) are listed below for reference.    Microbiology: Recent Results (from the past 240 hour(s))  MRSA PCR Screening     Status: None   Collection Time: 06/19/16  4:10 PM  Result Value Ref Range Status   MRSA by PCR NEGATIVE NEGATIVE Final    Comment:        The GeneXpert MRSA Assay (FDA approved for NASAL specimens only), is one component of a comprehensive MRSA colonization surveillance program. It is not intended to diagnose MRSA infection nor to guide or monitor treatment for MRSA infections.      Labs: BNP (last 3 results)  Recent Labs  06/05/16 2354 06/19/16 1210  BNP 45.8 25.0   Basic Metabolic Panel:  Recent Labs Lab 06/19/16 1210 06/19/16 1615 06/20/16 0340 06/21/16 0501  NA 126* 129* 130* 130*  K 3.3*  3.5 3.4* 3.6  CL 93* 97* 98* 99*  CO2 _0 GLUCOSE 93 145* 136* 139*  BUN _1 CREATININE 0.82 0.82 0.80 0.69  CALCIUM 7.9* 8.0* 8.0* 7.6*   Liver Function Tests:  Recent Labs Lab 06/19/16 1210 06/19/16 1615  AST 15 17  ALT 21 21  ALKPHOS 65 70  BILITOT 0.9 1.0  PROT 5.2* 5.4*  ALBUMIN 2.6* 2.7*   No results for input(s): LIPASE, AMYLASE in the last 168 hours. No results for input(s): AMMONIA in the last 168 hours. CBC:  Recent Labs Lab 06/19/16 1210 06/19/16 1610 06/20/16 0340 06/21/16 0501 06/22/16 0419  WBC 5.7  --  5.6 5.6 5.0  NEUTROABS 5.3  --   --   --   --   HGB 10.1*  --  9.8* 8.5* 7.8*  HCT 27.9*  --  28.1* 23.8* 22.5*  MCV 89.4  --  93.7 90.8 94.1  PLT 75* 79* 83* 71* 70*   Cardiac Enzymes: No results for input(s): CKTOTAL, CKMB, CKMBINDEX, TROPONINI in the last 168 hours. BNP: Invalid input(s): POCBNP CBG: No results for input(s): GLUCAP in the last 168 hours. D-Dimer  Recent Labs   06/19/16 1610  DDIMER 8.08*   Hgb A1c No results for input(s): HGBA1C in the last 72 hours. Lipid Profile No results for input(s): CHOL, HDL, LDLCALC, TRIG, CHOLHDL, LDLDIRECT in the last 72 hours. Thyroid function studies No results for input(s): TSH, T4TOTAL, T3FREE, THYROIDAB in the last 72 hours.  Invalid input(s): FREET3 Anemia work up No results for input(s): VITAMINB12, FOLATE, FERRITIN, TIBC, IRON, RETICCTPCT in the last 72 hours. Urinalysis    Component Value Date/Time   COLORURINE YELLOW 06/19/2016 1203   APPEARANCEUR HAZY (A) 06/19/2016 1203   LABSPEC 1.016 06/19/2016 1203   LABSPEC 1.005 05/09/2016 0937   PHURINE 6.0 06/19/2016 1203   GLUCOSEU NEGATIVE 06/19/2016 1203   GLUCOSEU Negative 05/09/2016 0937   HGBUR NEGATIVE 06/19/2016 [place] 06/19/2016 1203   BILIRUBINUR Negative 05/09/2016 [place] 06/19/2016 1203   PROTEINUR NEGATIVE 06/19/2016 1203   UROBILINOGEN 0.2 05/09/2016 0937   NITRITE POSITIVE (A) 06/19/2016 1203   LEUKOCYTESUR NEGATIVE 06/19/2016 1203   LEUKOCYTESUR Negative 05/09/2016 0937   Sepsis Labs Invalid input(s): PROCALCITONIN,  WBC,  LACTICIDVEN Microbiology Recent Results (from the past 240 hour(s))  MRSA PCR Screening     Status: None   Collection Time: 06/19/16  4:10 PM  Result Value Ref Range Status   MRSA by PCR NEGATIVE NEGATIVE Final    Comment:        The GeneXpert MRSA Assay (FDA approved for NASAL specimens only), is one component of a comprehensive MRSA colonization surveillance program. It is not intended to diagnose MRSA infection nor to guide or monitor treatment for MRSA infections.     Time coordinating discharge: Over 30 minutes  Vance Gather, MD  [facility] 06/22/2016, 2:05 PM Pager [phone]  If 7PM-7AM, please contact night-coverage [email or web address] Password [email or web address]

## 2016-06-22 NOTE — Discharge Instructions (Signed)
Information on my medicine - ELIQUIS (apixaban)  This medication education was reviewed with me or my healthcare representative as part of my discharge preparation.    Why was Eliquis prescribed for you? Eliquis was prescribed to treat blood clots that may have been found in the veins of your legs (deep vein thrombosis) or in your lungs (pulmonary embolism) and to reduce the risk of them occurring again.  What do You need to know about Eliquis ? The starting dose is 10 mg (two 5 mg tablets) taken TWICE daily for the FIRST SEVEN (7) DAYS, then on 06/29/16  the dose is reduced to ONE 5 mg tablet taken TWICE daily.  Eliquis may be taken with or without food.   Try to take the dose about the same time in the morning and in the evening. If you have difficulty swallowing the tablet whole please discuss with your pharmacist how to take the medication safely.  Take Eliquis exactly as prescribed and DO NOT stop taking Eliquis without talking to the doctor who prescribed the medication.  Stopping may increase your risk of developing a new blood clot.  Refill your prescription before you run out.  After discharge, you should have regular check-up appointments with your healthcare provider that is prescribing your Eliquis.    What do you do if you miss a dose? If a dose of ELIQUIS is not taken at the scheduled time, take it as soon as possible on the same day and twice-daily administration should be resumed. The dose should not be doubled to make up for a missed dose.  Important Safety Information A possible side effect of Eliquis is bleeding. You should call your healthcare provider right away if you experience any of the following: ? Bleeding from an injury or your nose that does not stop. ? Unusual colored urine (red or dark brown) or unusual colored stools (red or black). ? Unusual bruising for unknown reasons. ? A serious fall or if you hit your head (even if there is no bleeding).  Some  medicines may interact with Eliquis and might increase your risk of bleeding or clotting while on Eliquis. To help avoid this, consult your healthcare provider or pharmacist prior to using any new prescription or non-prescription medications, including herbals, vitamins, non-steroidal anti-inflammatory drugs (NSAIDs) and supplements.  This website has more information on Eliquis (apixaban): http://[email or web address].com/eliquis/home

## 2016-06-22 NOTE — Progress Notes (Signed)
Subjective: The patient is seen and examined today. He is feeling much better except for the mild cough and shortness breath with exertion. He denied having any current fever or chills. He has no nausea or vomiting. 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  Objective: Vital signs in last 24 hours: Temp:  [97.7 F (36.5 C)-98.5 F (36.9 C)] 97.9 F (36.6 C) (12/13 0501) Pulse Rate:  [72-95] 72 (12/13 0501) Resp:  [16] 16 (12/13 0501) BP: (90-100)/(56-64) 100/56 (12/13 0501) SpO2:  [95 %-98 %] 95 % (12/13 0501)  Intake/Output from previous day: 12/12 0701 - 12/13 0700 In: 590.8 [P.O.:480; I.V.:110.8] Out: -  Intake/Output this shift: Total I/O In: 350 [P.O.:350] Out: -   General appearance: alert, cooperative, fatigued and no distress Resp: rales bilaterally Cardio: regular rate and rhythm, S1, S2 normal, no murmur, click, rub or gallop GI: soft, non-tender; bowel sounds normal; no masses,  no organomegaly Extremities: extremities normal, atraumatic, no cyanosis or edema  Lab Results:   Recent Labs  06/21/16 0501 06/22/16 0419  WBC 5.6 5.0  HGB 8.5* 7.8*  HCT 23.8* 22.5*  PLT 71* 70*   BMET  Recent Labs  06/20/16 0340 06/21/16 0501  NA 130* 130*  K 3.4* 3.6  CL 98* 99*  CO2 25 26  GLUCOSE 136* 139*  BUN 10 12  CREATININE 0.80 0.69  CALCIUM 8.0* 7.6*    Studies/Results: No results found.  Medications: I have reviewed the patient's current medications.   Assessment/Plan: This is a very pleasant 75 years old white male with: 1) stage IV non-small cell lung cancer, adenocarcinoma with multiple brain metastasis status post stereotactic radiotherapy and the patient is started the first cycle of treatment with immunotherapy with Nat Math (pembrolizumab) last week. He tolerated his treatment well. He will resume cycle #2 in around 2 weeks. 2) history of multiple myeloma status post multiple systemic chemotherapy as well as peripheral blood stem cell transplant and  maintenance Revlimid. He is currently on observation and close monitoring. 3) recurrent pulmonary embolism: He was recently started on treatment with argatroban. 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 I recommended for the patient to start treatment with Eliquis on discharge. We'll continue to monitor him closely for any bleeding issues. 4) cough, shortness of breath and questionable inflammatory lung disease on the recent CT scan: I recommended for the patient treatment with Levaquin 500 mg by mouth daily for the next 7 days. 5) thrombocytopenia: Stable. We'll monitor.  LOS: 3 days    Tekoa Hamor K. 06/22/2016

## 2016-06-23 ENCOUNTER — Encounter: Payer: Self-pay | Admitting: Family Medicine

## 2016-06-24 ENCOUNTER — Inpatient Hospital Stay (HOSPITAL_COMMUNITY)
Admission: EM | Admit: 2016-06-24 | Discharge: 2016-07-13 | DRG: 329 | Disposition: A | Discharge status: Skilled Nursing Facility | Payer: Medicare Other | Attending: Family Medicine | Admitting: Family Medicine

## 2016-06-24 ENCOUNTER — Encounter (HOSPITAL_COMMUNITY): Payer: Self-pay

## 2016-06-24 ENCOUNTER — Emergency Department (HOSPITAL_COMMUNITY): Payer: Medicare Other

## 2016-06-24 ENCOUNTER — Other Ambulatory Visit: Payer: Self-pay

## 2016-06-24 ENCOUNTER — Telehealth: Payer: Self-pay | Admitting: *Deleted

## 2016-06-24 ENCOUNTER — Other Ambulatory Visit: Payer: Self-pay | Admitting: *Deleted

## 2016-06-24 DIAGNOSIS — K56699 Other intestinal obstruction unspecified as to partial versus complete obstruction: Secondary | ICD-10-CM | POA: Diagnosis not present

## 2016-06-24 DIAGNOSIS — I82409 Acute embolism and thrombosis of unspecified deep veins of unspecified lower extremity: Secondary | ICD-10-CM | POA: Diagnosis not present

## 2016-06-24 DIAGNOSIS — Z880 Allergy status to penicillin: Secondary | ICD-10-CM

## 2016-06-24 DIAGNOSIS — R109 Unspecified abdominal pain: Secondary | ICD-10-CM | POA: Diagnosis not present

## 2016-06-24 DIAGNOSIS — R112 Nausea with vomiting, unspecified: Secondary | ICD-10-CM | POA: Diagnosis not present

## 2016-06-24 DIAGNOSIS — R0602 Shortness of breath: Secondary | ICD-10-CM | POA: Diagnosis not present

## 2016-06-24 DIAGNOSIS — L89152 Pressure ulcer of sacral region, stage 2: Secondary | ICD-10-CM | POA: Diagnosis not present

## 2016-06-24 DIAGNOSIS — E8770 Fluid overload, unspecified: Secondary | ICD-10-CM | POA: Diagnosis not present

## 2016-06-24 DIAGNOSIS — Z9484 Stem cells transplant status: Secondary | ICD-10-CM | POA: Diagnosis not present

## 2016-06-24 DIAGNOSIS — Z6829 Body mass index (BMI) 29.0-29.9, adult: Secondary | ICD-10-CM

## 2016-06-24 DIAGNOSIS — E871 Hypo-osmolality and hyponatremia: Secondary | ICD-10-CM | POA: Diagnosis present

## 2016-06-24 DIAGNOSIS — C9001 Multiple myeloma in remission: Secondary | ICD-10-CM | POA: Diagnosis present

## 2016-06-24 DIAGNOSIS — K660 Peritoneal adhesions (postprocedural) (postinfection): Secondary | ICD-10-CM | POA: Diagnosis present

## 2016-06-24 DIAGNOSIS — Z833 Family history of diabetes mellitus: Secondary | ICD-10-CM

## 2016-06-24 DIAGNOSIS — E870 Hyperosmolality and hypernatremia: Secondary | ICD-10-CM | POA: Diagnosis not present

## 2016-06-24 DIAGNOSIS — Z978 Presence of other specified devices: Secondary | ICD-10-CM

## 2016-06-24 DIAGNOSIS — J9690 Respiratory failure, unspecified, unspecified whether with hypoxia or hypercapnia: Secondary | ICD-10-CM | POA: Diagnosis not present

## 2016-06-24 DIAGNOSIS — C3490 Malignant neoplasm of unspecified part of unspecified bronchus or lung: Secondary | ICD-10-CM | POA: Diagnosis not present

## 2016-06-24 DIAGNOSIS — Z515 Encounter for palliative care: Secondary | ICD-10-CM | POA: Diagnosis not present

## 2016-06-24 DIAGNOSIS — I82432 Acute embolism and thrombosis of left popliteal vein: Secondary | ICD-10-CM | POA: Diagnosis present

## 2016-06-24 DIAGNOSIS — D7582 Heparin induced thrombocytopenia (HIT): Secondary | ICD-10-CM | POA: Diagnosis not present

## 2016-06-24 DIAGNOSIS — E271 Primary adrenocortical insufficiency: Secondary | ICD-10-CM | POA: Diagnosis not present

## 2016-06-24 DIAGNOSIS — C7931 Secondary malignant neoplasm of brain: Secondary | ICD-10-CM

## 2016-06-24 DIAGNOSIS — T17918A Gastric contents in respiratory tract, part unspecified causing other injury, initial encounter: Secondary | ICD-10-CM | POA: Diagnosis not present

## 2016-06-24 DIAGNOSIS — J9601 Acute respiratory failure with hypoxia: Secondary | ICD-10-CM | POA: Diagnosis not present

## 2016-06-24 DIAGNOSIS — I82403 Acute embolism and thrombosis of unspecified deep veins of lower extremity, bilateral: Secondary | ICD-10-CM

## 2016-06-24 DIAGNOSIS — Z85820 Personal history of malignant melanoma of skin: Secondary | ICD-10-CM

## 2016-06-24 DIAGNOSIS — R609 Edema, unspecified: Secondary | ICD-10-CM | POA: Diagnosis not present

## 2016-06-24 DIAGNOSIS — Z7189 Other specified counseling: Secondary | ICD-10-CM | POA: Diagnosis not present

## 2016-06-24 DIAGNOSIS — D62 Acute posthemorrhagic anemia: Secondary | ICD-10-CM | POA: Diagnosis present

## 2016-06-24 DIAGNOSIS — Q430 Meckel's diverticulum (displaced) (hypertrophic): Secondary | ICD-10-CM

## 2016-06-24 DIAGNOSIS — Z8042 Family history of malignant neoplasm of prostate: Secondary | ICD-10-CM | POA: Diagnosis not present

## 2016-06-24 DIAGNOSIS — K922 Gastrointestinal hemorrhage, unspecified: Secondary | ICD-10-CM | POA: Diagnosis present

## 2016-06-24 DIAGNOSIS — T17908A Unspecified foreign body in respiratory tract, part unspecified causing other injury, initial encounter: Secondary | ICD-10-CM | POA: Diagnosis not present

## 2016-06-24 DIAGNOSIS — R1084 Generalized abdominal pain: Secondary | ICD-10-CM

## 2016-06-24 DIAGNOSIS — A4150 Gram-negative sepsis, unspecified: Secondary | ICD-10-CM | POA: Diagnosis not present

## 2016-06-24 DIAGNOSIS — D696 Thrombocytopenia, unspecified: Secondary | ICD-10-CM | POA: Diagnosis not present

## 2016-06-24 DIAGNOSIS — D649 Anemia, unspecified: Secondary | ICD-10-CM | POA: Diagnosis not present

## 2016-06-24 DIAGNOSIS — I82411 Acute embolism and thrombosis of right femoral vein: Secondary | ICD-10-CM | POA: Diagnosis not present

## 2016-06-24 DIAGNOSIS — N400 Enlarged prostate without lower urinary tract symptoms: Secondary | ICD-10-CM | POA: Diagnosis not present

## 2016-06-24 DIAGNOSIS — E876 Hypokalemia: Secondary | ICD-10-CM | POA: Diagnosis not present

## 2016-06-24 DIAGNOSIS — R0603 Acute respiratory distress: Secondary | ICD-10-CM | POA: Diagnosis not present

## 2016-06-24 DIAGNOSIS — Z9221 Personal history of antineoplastic chemotherapy: Secondary | ICD-10-CM

## 2016-06-24 DIAGNOSIS — N179 Acute kidney failure, unspecified: Secondary | ICD-10-CM | POA: Diagnosis not present

## 2016-06-24 DIAGNOSIS — Z86718 Personal history of other venous thrombosis and embolism: Secondary | ICD-10-CM

## 2016-06-24 DIAGNOSIS — N411 Chronic prostatitis: Secondary | ICD-10-CM | POA: Diagnosis present

## 2016-06-24 DIAGNOSIS — Z87891 Personal history of nicotine dependence: Secondary | ICD-10-CM

## 2016-06-24 DIAGNOSIS — I2699 Other pulmonary embolism without acute cor pulmonale: Secondary | ICD-10-CM | POA: Diagnosis present

## 2016-06-24 DIAGNOSIS — J189 Pneumonia, unspecified organism: Secondary | ICD-10-CM | POA: Diagnosis not present

## 2016-06-24 DIAGNOSIS — Z0189 Encounter for other specified special examinations: Secondary | ICD-10-CM

## 2016-06-24 DIAGNOSIS — R0600 Dyspnea, unspecified: Secondary | ICD-10-CM | POA: Diagnosis not present

## 2016-06-24 DIAGNOSIS — C3491 Malignant neoplasm of unspecified part of right bronchus or lung: Secondary | ICD-10-CM | POA: Diagnosis not present

## 2016-06-24 DIAGNOSIS — T17908D Unspecified foreign body in respiratory tract, part unspecified causing other injury, subsequent encounter: Secondary | ICD-10-CM | POA: Diagnosis not present

## 2016-06-24 DIAGNOSIS — Z96652 Presence of left artificial knee joint: Secondary | ICD-10-CM | POA: Diagnosis present

## 2016-06-24 DIAGNOSIS — R6521 Severe sepsis with septic shock: Secondary | ICD-10-CM | POA: Diagnosis not present

## 2016-06-24 DIAGNOSIS — E878 Other disorders of electrolyte and fluid balance, not elsewhere classified: Secondary | ICD-10-CM | POA: Diagnosis not present

## 2016-06-24 DIAGNOSIS — R51 Headache: Secondary | ICD-10-CM | POA: Diagnosis not present

## 2016-06-24 DIAGNOSIS — Z6831 Body mass index (BMI) 31.0-31.9, adult: Secondary | ICD-10-CM

## 2016-06-24 DIAGNOSIS — K5651 Intestinal adhesions [bands], with partial obstruction: Secondary | ICD-10-CM | POA: Diagnosis not present

## 2016-06-24 DIAGNOSIS — Z86711 Personal history of pulmonary embolism: Secondary | ICD-10-CM

## 2016-06-24 DIAGNOSIS — E440 Moderate protein-calorie malnutrition: Secondary | ICD-10-CM | POA: Diagnosis present

## 2016-06-24 DIAGNOSIS — I82431 Acute embolism and thrombosis of right popliteal vein: Secondary | ICD-10-CM | POA: Diagnosis present

## 2016-06-24 DIAGNOSIS — I82441 Acute embolism and thrombosis of right tibial vein: Secondary | ICD-10-CM | POA: Diagnosis present

## 2016-06-24 DIAGNOSIS — I824Z3 Acute embolism and thrombosis of unspecified deep veins of distal lower extremity, bilateral: Secondary | ICD-10-CM | POA: Diagnosis not present

## 2016-06-24 DIAGNOSIS — I959 Hypotension, unspecified: Secondary | ICD-10-CM

## 2016-06-24 DIAGNOSIS — R140 Abdominal distension (gaseous): Secondary | ICD-10-CM | POA: Diagnosis not present

## 2016-06-24 DIAGNOSIS — E119 Type 2 diabetes mellitus without complications: Secondary | ICD-10-CM | POA: Diagnosis present

## 2016-06-24 DIAGNOSIS — C9000 Multiple myeloma not having achieved remission: Secondary | ICD-10-CM | POA: Diagnosis not present

## 2016-06-24 DIAGNOSIS — J9600 Acute respiratory failure, unspecified whether with hypoxia or hypercapnia: Secondary | ICD-10-CM

## 2016-06-24 DIAGNOSIS — M1990 Unspecified osteoarthritis, unspecified site: Secondary | ICD-10-CM | POA: Diagnosis present

## 2016-06-24 DIAGNOSIS — I82401 Acute embolism and thrombosis of unspecified deep veins of right lower extremity: Secondary | ICD-10-CM | POA: Diagnosis not present

## 2016-06-24 DIAGNOSIS — J690 Pneumonitis due to inhalation of food and vomit: Secondary | ICD-10-CM | POA: Diagnosis not present

## 2016-06-24 DIAGNOSIS — Z7984 Long term (current) use of oral hypoglycemic drugs: Secondary | ICD-10-CM

## 2016-06-24 DIAGNOSIS — T380X5A Adverse effect of glucocorticoids and synthetic analogues, initial encounter: Secondary | ICD-10-CM | POA: Diagnosis not present

## 2016-06-24 DIAGNOSIS — Z79899 Other long term (current) drug therapy: Secondary | ICD-10-CM

## 2016-06-24 DIAGNOSIS — R7881 Bacteremia: Secondary | ICD-10-CM | POA: Diagnosis not present

## 2016-06-24 DIAGNOSIS — I5032 Chronic diastolic (congestive) heart failure: Secondary | ICD-10-CM | POA: Diagnosis present

## 2016-06-24 DIAGNOSIS — E46 Unspecified protein-calorie malnutrition: Secondary | ICD-10-CM | POA: Diagnosis not present

## 2016-06-24 DIAGNOSIS — Z789 Other specified health status: Secondary | ICD-10-CM | POA: Diagnosis not present

## 2016-06-24 DIAGNOSIS — Z4682 Encounter for fitting and adjustment of non-vascular catheter: Secondary | ICD-10-CM | POA: Diagnosis not present

## 2016-06-24 DIAGNOSIS — Z818 Family history of other mental and behavioral disorders: Secondary | ICD-10-CM

## 2016-06-24 DIAGNOSIS — R739 Hyperglycemia, unspecified: Secondary | ICD-10-CM | POA: Diagnosis not present

## 2016-06-24 DIAGNOSIS — E232 Diabetes insipidus: Secondary | ICD-10-CM | POA: Diagnosis not present

## 2016-06-24 DIAGNOSIS — Z66 Do not resuscitate: Secondary | ICD-10-CM | POA: Diagnosis not present

## 2016-06-24 DIAGNOSIS — R945 Abnormal results of liver function studies: Secondary | ICD-10-CM | POA: Diagnosis not present

## 2016-06-24 DIAGNOSIS — K56600 Partial intestinal obstruction, unspecified as to cause: Secondary | ICD-10-CM

## 2016-06-24 DIAGNOSIS — R5381 Other malaise: Secondary | ICD-10-CM | POA: Diagnosis not present

## 2016-06-24 DIAGNOSIS — Z823 Family history of stroke: Secondary | ICD-10-CM | POA: Diagnosis not present

## 2016-06-24 DIAGNOSIS — M1711 Unilateral primary osteoarthritis, right knee: Secondary | ICD-10-CM | POA: Diagnosis present

## 2016-06-24 DIAGNOSIS — R531 Weakness: Secondary | ICD-10-CM | POA: Diagnosis not present

## 2016-06-24 DIAGNOSIS — K219 Gastro-esophageal reflux disease without esophagitis: Secondary | ICD-10-CM | POA: Diagnosis present

## 2016-06-24 DIAGNOSIS — M79651 Pain in right thigh: Secondary | ICD-10-CM | POA: Diagnosis not present

## 2016-06-24 DIAGNOSIS — K529 Noninfective gastroenteritis and colitis, unspecified: Secondary | ICD-10-CM

## 2016-06-24 DIAGNOSIS — K56609 Unspecified intestinal obstruction, unspecified as to partial versus complete obstruction: Secondary | ICD-10-CM

## 2016-06-24 DIAGNOSIS — R52 Pain, unspecified: Secondary | ICD-10-CM

## 2016-06-24 DIAGNOSIS — E8809 Other disorders of plasma-protein metabolism, not elsewhere classified: Secondary | ICD-10-CM | POA: Diagnosis not present

## 2016-06-24 DIAGNOSIS — K559 Vascular disorder of intestine, unspecified: Secondary | ICD-10-CM

## 2016-06-24 DIAGNOSIS — A419 Sepsis, unspecified organism: Secondary | ICD-10-CM | POA: Diagnosis not present

## 2016-06-24 DIAGNOSIS — Z7901 Long term (current) use of anticoagulants: Secondary | ICD-10-CM

## 2016-06-24 DIAGNOSIS — Z923 Personal history of irradiation: Secondary | ICD-10-CM

## 2016-06-24 DIAGNOSIS — Z8249 Family history of ischemic heart disease and other diseases of the circulatory system: Secondary | ICD-10-CM

## 2016-06-24 DIAGNOSIS — M7989 Other specified soft tissue disorders: Secondary | ICD-10-CM | POA: Diagnosis not present

## 2016-06-24 DIAGNOSIS — I82402 Acute embolism and thrombosis of unspecified deep veins of left lower extremity: Secondary | ICD-10-CM | POA: Diagnosis not present

## 2016-06-24 DIAGNOSIS — X58XXXA Exposure to other specified factors, initial encounter: Secondary | ICD-10-CM | POA: Diagnosis present

## 2016-06-24 DIAGNOSIS — J90 Pleural effusion, not elsewhere classified: Secondary | ICD-10-CM | POA: Diagnosis not present

## 2016-06-24 DIAGNOSIS — K5669 Other partial intestinal obstruction: Secondary | ICD-10-CM | POA: Diagnosis not present

## 2016-06-24 DIAGNOSIS — C784 Secondary malignant neoplasm of small intestine: Secondary | ICD-10-CM | POA: Diagnosis not present

## 2016-06-24 DIAGNOSIS — R600 Localized edema: Secondary | ICD-10-CM | POA: Diagnosis not present

## 2016-06-24 DIAGNOSIS — Z8719 Personal history of other diseases of the digestive system: Secondary | ICD-10-CM

## 2016-06-24 DIAGNOSIS — C801 Malignant (primary) neoplasm, unspecified: Secondary | ICD-10-CM | POA: Diagnosis not present

## 2016-06-24 DIAGNOSIS — O2230 Deep phlebothrombosis in pregnancy, unspecified trimester: Secondary | ICD-10-CM

## 2016-06-24 DIAGNOSIS — E1165 Type 2 diabetes mellitus with hyperglycemia: Secondary | ICD-10-CM | POA: Diagnosis present

## 2016-06-24 DIAGNOSIS — R519 Headache, unspecified: Secondary | ICD-10-CM | POA: Diagnosis present

## 2016-06-24 DIAGNOSIS — I825Z9 Chronic embolism and thrombosis of unspecified deep veins of unspecified distal lower extremity: Secondary | ICD-10-CM | POA: Diagnosis not present

## 2016-06-24 DIAGNOSIS — D638 Anemia in other chronic diseases classified elsewhere: Secondary | ICD-10-CM | POA: Diagnosis not present

## 2016-06-24 LAB — CBC
HEMATOCRIT: 28.8 % — AB (ref 39.0–52.0)
Hemoglobin: 10.2 g/dL — ABNORMAL LOW (ref 13.0–17.0)
MCH: 31.8 pg (ref 26.0–34.0)
MCHC: 35.4 g/dL (ref 30.0–36.0)
MCV: 89.7 fL (ref 78.0–100.0)
Platelets: 109 10*3/uL — ABNORMAL LOW (ref 150–400)
RBC: 3.21 MIL/uL — ABNORMAL LOW (ref 4.22–5.81)
RDW: 16.6 % — AB (ref 11.5–15.5)
WBC: 5.3 10*3/uL (ref 4.0–10.5)

## 2016-06-24 LAB — CBC WITH DIFFERENTIAL/PLATELET
BASOS PCT: 0 %
Basophils Absolute: 0 10*3/uL (ref 0.0–0.1)
EOS ABS: 0 10*3/uL (ref 0.0–0.7)
Eosinophils Relative: 0 %
HEMATOCRIT: 25.3 % — AB (ref 39.0–52.0)
HEMOGLOBIN: 9.2 g/dL — AB (ref 13.0–17.0)
LYMPHS ABS: 0.5 10*3/uL — AB (ref 0.7–4.0)
Lymphocytes Relative: 8 %
MCH: 32.9 pg (ref 26.0–34.0)
MCHC: 36.4 g/dL — AB (ref 30.0–36.0)
MCV: 90.4 fL (ref 78.0–100.0)
Monocytes Absolute: 0.1 10*3/uL (ref 0.1–1.0)
Monocytes Relative: 3 %
NEUTROS ABS: 5 10*3/uL (ref 1.7–7.7)
NEUTROS PCT: 89 %
Platelets: 95 10*3/uL — ABNORMAL LOW (ref 150–400)
RBC: 2.8 MIL/uL — ABNORMAL LOW (ref 4.22–5.81)
RDW: 16.4 % — AB (ref 11.5–15.5)
WBC: 5.6 10*3/uL (ref 4.0–10.5)

## 2016-06-24 LAB — URINALYSIS, ROUTINE W REFLEX MICROSCOPIC
Bilirubin Urine: NEGATIVE
GLUCOSE, UA: NEGATIVE mg/dL
HGB URINE DIPSTICK: NEGATIVE
Ketones, ur: NEGATIVE mg/dL
LEUKOCYTES UA: NEGATIVE
NITRITE: NEGATIVE
Protein, ur: NEGATIVE mg/dL
Specific Gravity, Urine: 1.008 (ref 1.005–1.030)
pH: 6 (ref 5.0–8.0)

## 2016-06-24 LAB — COMPREHENSIVE METABOLIC PANEL
ALK PHOS: 70 U/L (ref 38–126)
ALT: 24 U/L (ref 17–63)
ANION GAP: 9 (ref 5–15)
AST: 18 U/L (ref 15–41)
Albumin: 2.5 g/dL — ABNORMAL LOW (ref 3.5–5.0)
BILIRUBIN TOTAL: 0.6 mg/dL (ref 0.3–1.2)
BUN: 11 mg/dL (ref 6–20)
CALCIUM: 8.5 mg/dL — AB (ref 8.9–10.3)
CO2: 26 mmol/L (ref 22–32)
Chloride: 91 mmol/L — ABNORMAL LOW (ref 101–111)
Creatinine, Ser: 0.86 mg/dL (ref 0.61–1.24)
GFR calc non Af Amer: >60 mL/min (ref >60)
Glucose, Bld: 96 mg/dL (ref 65–99)
POTASSIUM: 3.3 mmol/L — AB (ref 3.5–5.1)
Sodium: 126 mmol/L — ABNORMAL LOW (ref 135–145)
Total Protein: 5.2 g/dL — ABNORMAL LOW (ref 6.5–8.1)

## 2016-06-24 LAB — LIPASE, BLOOD: LIPASE: 21 U/L (ref 11–51)

## 2016-06-24 LAB — TYPE AND SCREEN
ABO/RH(D): O POS
Antibody Screen: NEGATIVE

## 2016-06-24 LAB — PROTIME-INR
INR: 1.14
Prothrombin Time: 14.7 seconds (ref 11.4–15.2)

## 2016-06-24 LAB — SODIUM, URINE, RANDOM: Sodium, Ur: <10 mmol/L

## 2016-06-24 LAB — BASIC METABOLIC PANEL
Anion gap: 10 (ref 5–15)
BUN: 10 mg/dL (ref 6–20)
CHLORIDE: 94 mmol/L — AB (ref 101–111)
CO2: 25 mmol/L (ref 22–32)
CREATININE: 0.99 mg/dL (ref 0.61–1.24)
Calcium: 8.7 mg/dL — ABNORMAL LOW (ref 8.9–10.3)
GFR calc non Af Amer: >60 mL/min (ref >60)
GLUCOSE: 111 mg/dL — AB (ref 65–99)
Potassium: 3.2 mmol/L — ABNORMAL LOW (ref 3.5–5.1)
Sodium: 129 mmol/L — ABNORMAL LOW (ref 135–145)

## 2016-06-24 LAB — TROPONIN I: Troponin I: <0.03 ng/mL (ref <0.03)

## 2016-06-24 LAB — POC OCCULT BLOOD, ED: Fecal Occult Bld: POSITIVE — AB

## 2016-06-24 LAB — BRAIN NATRIURETIC PEPTIDE: B Natriuretic Peptide: 94.7 pg/mL (ref 0.0–100.0)

## 2016-06-24 LAB — APTT: APTT: 26 s (ref 24–36)

## 2016-06-24 LAB — I-STAT CG4 LACTIC ACID, ED: LACTIC ACID, VENOUS: 0.96 mmol/L (ref 0.5–1.9)

## 2016-06-24 LAB — OSMOLALITY, URINE: Osmolality, Ur: 250 mOsm/kg — ABNORMAL LOW (ref 300–900)

## 2016-06-24 MED ORDER — DEXAMETHASONE 2 MG PO TABS
2.0000 mg | ORAL_TABLET | Freq: Two times a day (BID) | ORAL | Status: DC
  Administered 2016-06-24: 2 mg via ORAL
  Filled 2016-06-24 (×2): qty 1

## 2016-06-24 MED ORDER — HYDROMORPHONE HCL 1 MG/ML IJ SOLN
1.0000 mg | INTRAMUSCULAR | Status: DC | PRN
  Administered 2016-06-24: 1 mg via INTRAVENOUS
  Filled 2016-06-24: qty 1

## 2016-06-24 MED ORDER — ONDANSETRON HCL 4 MG/2ML IJ SOLN
4.0000 mg | Freq: Once | INTRAMUSCULAR | Status: AC
  Administered 2016-06-24: 4 mg via INTRAVENOUS
  Filled 2016-06-24: qty 2

## 2016-06-24 MED ORDER — METHOCARBAMOL 500 MG PO TABS
500.0000 mg | ORAL_TABLET | Freq: Four times a day (QID) | ORAL | Status: DC | PRN
  Administered 2016-06-24: 500 mg via ORAL
  Filled 2016-06-24: qty 1

## 2016-06-24 MED ORDER — SODIUM CHLORIDE 0.9 % IV BOLUS (SEPSIS)
1000.0000 mL | Freq: Once | INTRAVENOUS | Status: AC
  Administered 2016-06-24: 1000 mL via INTRAVENOUS

## 2016-06-24 MED ORDER — HYDROCORTISONE NA SUCCINATE PF 100 MG IJ SOLR
50.0000 mg | Freq: Once | INTRAMUSCULAR | Status: AC
  Administered 2016-06-24: 50 mg via INTRAVENOUS
  Filled 2016-06-24: qty 2

## 2016-06-24 MED ORDER — VITAMIN C 500 MG PO TABS
500.0000 mg | ORAL_TABLET | Freq: Two times a day (BID) | ORAL | Status: DC
  Administered 2016-06-24 – 2016-06-25 (×2): 500 mg via ORAL
  Filled 2016-06-24 (×2): qty 1

## 2016-06-24 MED ORDER — RISAQUAD PO CAPS
1.0000 | ORAL_CAPSULE | Freq: Every day | ORAL | Status: DC
  Administered 2016-06-25: 1 via ORAL
  Filled 2016-06-24: qty 1

## 2016-06-24 MED ORDER — ZOLPIDEM TARTRATE 5 MG PO TABS: 5.0000 mg | ORAL_TABLET | Freq: Every evening | ORAL | Status: DC | PRN

## 2016-06-24 MED ORDER — IOPAMIDOL (ISOVUE-370) INJECTION 76%
100.0000 mL | Freq: Once | INTRAVENOUS | Status: AC | PRN
  Administered 2016-06-24: 100 mL via INTRAVENOUS

## 2016-06-24 MED ORDER — PROCHLORPERAZINE EDISYLATE 5 MG/ML IJ SOLN
10.0000 mg | Freq: Once | INTRAMUSCULAR | Status: AC
  Administered 2016-06-24: 10 mg via INTRAVENOUS
  Filled 2016-06-24: qty 2

## 2016-06-24 MED ORDER — SODIUM CHLORIDE 0.9 % IV BOLUS (SEPSIS): 500.0000 mL | Freq: Once | INTRAVENOUS | Status: DC

## 2016-06-24 MED ORDER — ARGATROBAN 50 MG/50ML IV SOLN
0.7000 ug/kg/min | INTRAVENOUS | Status: DC
  Administered 2016-06-24: 0.7 ug/kg/min via INTRAVENOUS
  Filled 2016-06-24: qty 50

## 2016-06-24 MED ORDER — OXYCODONE HCL 5 MG PO TABS: 5.0000 mg | ORAL_TABLET | Freq: Four times a day (QID) | ORAL | Status: DC | PRN

## 2016-06-24 MED ORDER — PANTOPRAZOLE SODIUM 40 MG IV SOLR
40.0000 mg | Freq: Two times a day (BID) | INTRAVENOUS | Status: DC
  Administered 2016-06-24 – 2016-06-28 (×8): 40 mg via INTRAVENOUS
  Filled 2016-06-24 (×8): qty 40

## 2016-06-24 MED ORDER — LEVOFLOXACIN 500 MG PO TABS
500.0000 mg | ORAL_TABLET | Freq: Every day | ORAL | Status: DC
  Administered 2016-06-25: 500 mg via ORAL
  Filled 2016-06-24: qty 1

## 2016-06-24 MED ORDER — HYDROMORPHONE HCL 1 MG/ML IJ SOLN
1.0000 mg | Freq: Once | INTRAMUSCULAR | Status: AC
  Administered 2016-06-24: 1 mg via INTRAVENOUS
  Filled 2016-06-24: qty 1

## 2016-06-24 MED ORDER — LORAZEPAM 0.5 MG PO TABS
0.5000 mg | ORAL_TABLET | Freq: Four times a day (QID) | ORAL | Status: DC | PRN
  Administered 2016-06-24: 0.5 mg via ORAL
  Filled 2016-06-24: qty 1

## 2016-06-24 MED ORDER — SODIUM CHLORIDE 0.9% FLUSH
3.0000 mL | Freq: Two times a day (BID) | INTRAVENOUS | Status: DC
  Administered 2016-06-25 – 2016-07-12 (×18): 3 mL via INTRAVENOUS

## 2016-06-24 MED ORDER — ACETAMINOPHEN 650 MG RE SUPP
650.0000 mg | Freq: Four times a day (QID) | RECTAL | Status: DC | PRN
  Administered 2016-06-29: 650 mg via RECTAL
  Filled 2016-06-24: qty 1

## 2016-06-24 MED ORDER — PANTOPRAZOLE SODIUM 40 MG PO TBEC
40.0000 mg | DELAYED_RELEASE_TABLET | Freq: Two times a day (BID) | ORAL | Status: DC
Start: 2016-06-24 — End: 2016-06-24

## 2016-06-24 MED ORDER — SODIUM CHLORIDE 0.9 % IV SOLN
INTRAVENOUS | Status: DC
  Administered 2016-06-24 – 2016-06-25 (×2): via INTRAVENOUS
  Administered 2016-06-25: 1000 mL via INTRAVENOUS
  Administered 2016-06-26: 03:00:00 via INTRAVENOUS

## 2016-06-24 MED ORDER — ALBUTEROL SULFATE (2.5 MG/3ML) 0.083% IN NEBU: 2.5000 mg | INHALATION_SOLUTION | RESPIRATORY_TRACT | Status: DC | PRN

## 2016-06-24 MED ORDER — IOPAMIDOL (ISOVUE-370) INJECTION 76%
INTRAVENOUS | Status: AC
  Filled 2016-06-24: qty 100

## 2016-06-24 MED ORDER — ONDANSETRON HCL 4 MG/2ML IJ SOLN
4.0000 mg | Freq: Three times a day (TID) | INTRAMUSCULAR | Status: DC | PRN
  Administered 2016-06-24 – 2016-07-13 (×14): 4 mg via INTRAVENOUS
  Filled 2016-06-24 (×16): qty 2

## 2016-06-24 MED ORDER — ADULT MULTIVITAMIN W/MINERALS CH
1.0000 | ORAL_TABLET | Freq: Every day | ORAL | Status: DC
  Administered 2016-06-24: 1 via ORAL
  Filled 2016-06-24: qty 1

## 2016-06-24 MED ORDER — ACETAMINOPHEN 500 MG PO TABS: 500.0000 mg | ORAL_TABLET | Freq: Four times a day (QID) | ORAL | Status: DC | PRN

## 2016-06-24 MED ORDER — TEMAZEPAM 15 MG PO CAPS: 15.0000 mg | ORAL_CAPSULE | Freq: Every evening | ORAL | Status: DC | PRN

## 2016-06-24 MED ORDER — DIPHENHYDRAMINE HCL 50 MG/ML IJ SOLN
25.0000 mg | Freq: Once | INTRAMUSCULAR | Status: AC
  Administered 2016-06-24: 25 mg via INTRAMUSCULAR
  Filled 2016-06-24: qty 1

## 2016-06-24 MED ORDER — SODIUM CHLORIDE 0.9 % IJ SOLN
INTRAMUSCULAR | Status: AC
  Filled 2016-06-24: qty 50

## 2016-06-24 MED ORDER — LORAZEPAM 0.5 MG PO TABS: ORAL_TABLET | ORAL | 0 refills | Status: DC

## 2016-06-24 MED ORDER — VITAMIN D3 25 MCG (1000 UNIT) PO TABS
1000.0000 [IU] | ORAL_TABLET | Freq: Every day | ORAL | Status: DC
  Administered 2016-06-24 – 2016-07-10 (×12): 1000 [IU] via ORAL
  Filled 2016-06-24 (×14): qty 1

## 2016-06-24 MED ORDER — ACETAMINOPHEN 325 MG PO TABS
650.0000 mg | ORAL_TABLET | Freq: Four times a day (QID) | ORAL | Status: DC | PRN
  Administered 2016-07-01 – 2016-07-03 (×3): 650 mg via ORAL
  Filled 2016-06-24 (×4): qty 2

## 2016-06-24 MED ORDER — POTASSIUM CHLORIDE 20 MEQ/15ML (10%) PO SOLN
40.0000 meq | Freq: Once | ORAL | Status: AC
  Administered 2016-06-24: 40 meq via ORAL
  Filled 2016-06-24: qty 30

## 2016-06-24 NOTE — H&P (Addendum)
History and Physical    [name]:[id number] DOB: 11/05/1940 DOA: 06/24/2016  Referring MD/NP/PA:   PCP: [name], MD   Patient coming from:  The patient is coming from home.  At baseline, pt is independent for most of ADL.   Chief Complaint: SOB and abdominal pain  HPI: [name] is a 75 y.o. male with medical history significant of recently diagnosed stage IV NSCLC (80% PDL1+) being treated with pembrolizomab [name], immunotherapy), recent SBO (thought to be due to adhesions), DVT, multifocal PE (with possible HIT) on Eliquis, Gerd, multiple myeloma with skull lesion (s/p of radiation therapy 2016), chronic prostatitis, BPH, dCHF, diabetes insipidus on DDAVP, who presents with shortness of breath abdominal pain.  Patient states that he has been constipated in the past 2 days and developed nausea and abdominal pain. No diarrhea or vomiting. His abdominal pain is located in the central abdomen, constant, 9 out of 10 in severity, nonradiating. It is not aggravated or alleviated by any known factors. Patient denies rectal bleeding. He also reports worsening shortness of breath. He has dry cough and chest pain, no fever or chills. The chest pain is located in the right side of chest, constant, sharp, 7 out of 10 severe. It is pleuritic, and aggravated by deep breath. He also reports frontal headache which has been going on for a long time, no acute change. Currently 6 out of 10 in severity. He has generalized weakness, but no unilateral weakness, numbness, vision change or hearing loss. He denies symptoms of UTI. Of note, he has been regularly taking oxycodone, but has not been taking any laxatives as the miralax he was taking gave him diarrhea  ED Course: pt was found to have positive FOBT, stable hgb 7.8 on 12/13-->9.2 today, WBC 5.6, platelets 95, sodium 126, creatinine normal, negative troponin, lactate 0.96, BNP 94.7, negative urinalysis, negative CT head for acute  intracranial abnormalities, chest x-ray showed improved aeration, temperature normal, O2 saturation 95%. Patient had one episode of hypotension with blood pressure 86/66 after he received 1 dose of morphine in ED. Blood pressure improved to 92/71 after 1 L normal saline bolus. CTA of chest showed worsening PE. CT abdomen/pelvis showed small bowel obstruction. Patient is admitted to stepdown as inpatient. PCCM and general surgery were consulted.  Review of Systems:   General: no fevers, chills, no changes in body weight, has poor appetite, has fatigue HEENT: no blurry vision, hearing changes or sore throat. Has HA. Respiratory: has dyspnea, coughing, no wheezing CV: no chest pain, no palpitations GI: no nausea, abdominal pain, constipation, No diarrhea, vomiting, GU: no dysuria, burning on urination, increased urinary frequency, hematuria  Ext: has leg edema Neuro: no unilateral weakness, numbness, or tingling, no vision change or hearing loss. Skin: no rash, no skin tear. MSK: No muscle spasm, no deformity, no limitation of range of movement in spin Heme: No easy bruising.  Travel history: No recent long distant travel.  Allergy:  Allergies  Allergen Reactions  . Tramadol Palpitations    "Made my chest feel tight"  . Amoxicillin     REACTION: rash  . Penicillins Rash    Has patient had a PCN reaction causing immediate rash, facial/tongue/throat swelling, SOB or lightheadedness with hypotension: unknown Has patient had a PCN reaction causing severe rash involving mucus membranes or skin necrosis: unknown Has patient had a PCN reaction that required hospitalization: no  Has patient had a PCN reaction occurring within the last 10 years: no  If  all of the above answers are "NO", then may proceed with Cephalosporin use.     Past Medical History:  Diagnosis Date  . Adenocarcinoma of right lung, stage 4 ([place]) 05/27/2016  . Adenomatous polyp of colon 1996  . Arthritis    Osteoarthritis  right knee, spine, wrist  . Arthritis of hand, right    Right thumb (Dr. Daylene Katayama)  . Back ache    r/o Multiple Myeloma  . Benign prostatic hypertrophy    (Dr. Karsten Ro in past)  . Degenerative disc disease   . Diverticulosis   . DVT (deep venous thrombosis) ([place]) 01/2014   R calf  . Encounter for antineoplastic chemotherapy 10/20/2015  . Erosive esophagitis   . GERD (gastroesophageal reflux disease)   . Hemorrhoids   . Hiatal hernia   . History of chronic prostatitis   . History of melanoma    Back (Dr. Wilhemina Bonito); early melanoma R arm 03/2011  . Multiple myeloma ([place]) 2013   skull lytic lesions--treated with radiation 07/2014  . Pulmonary embolism ([place]) 01/2014  . Radiation 07/21/14-08/05/14   left occipital parietal skull 25 gray  . SCC (squamous cell carcinoma) 08/04/15   right ear;dysplastic nevus with severe atypia-chest    Past Surgical History:  Procedure Laterality Date  . APPENDECTOMY    . BONE MARROW TRANSPLANT  03/2013   Stem Cell  . COLONOSCOPY  2009   adenomatous polyp  . ESOPHAGOGASTRODUODENOSCOPY  2009   mild inflammation at esophagogastric junction  . excision of melanoma     back ( x3)  . Great toe surgery     bilateral  . INGUINAL HERNIA REPAIR     left, x 2  . port placemet  10/2012  . SCALENE NODE BIOPSY Right 05/11/2016   Procedure: BIOPSY RIGHT SCALENE NODE;  Surgeon: Grace Isaac, MD;  Location: [facility];  Service: Thoracic;  Laterality: Right;  . TOTAL KNEE ARTHROPLASTY  06/08/2011   Procedure: TOTAL KNEE ARTHROPLASTY;  Surgeon: Ninetta Lights, MD;  Location: [facility];  Service: Orthopedics;  Laterality: Right;  osteonics  . total knee replaced  2000   Left knee    Social History:  reports that he quit smoking about 39 years ago. His smoking use included Cigarettes. He has a 30.00 pack-year smoking history. He has never used smokeless tobacco. He reports that he drinks about 1.5 oz of alcohol per week . He reports that he does not use drugs.  Family  History:  Family History  Problem Relation Age of Onset  . Dementia Mother   . Stroke Father     brainstem stroke  . Hypertension Father   . Diabetes Father   . Prostate cancer Maternal Grandfather   . Depression Daughter   . Anesthesia problems Neg Hx      Prior to Admission medications   Medication Sig Start Date End Date Taking? Authorizing Provider  acetaminophen (TYLENOL) 500 MG tablet Take 500 mg by mouth every 6 (six) hours as needed for moderate pain or headache.   Yes Historical Provider, MD  apixaban (ELIQUIS) 5 MG TABS tablet Take 2 tabs (72m) po BID for 7 days, then take 1 tab (532m po BID 06/22/16  Yes RyPatrecia PourMD  ascorbic acid (VITAMIN C) 500 MG tablet Take 500 mg by mouth 2 (two) times daily.    Yes Historical Provider, MD  cholecalciferol (VITAMIN D) 1000 UNITS tablet Take 1,000 Units by mouth at bedtime.    Yes Historical Provider, MD  desmopressin (DDAVP) 0.1 MG tablet Take 1 tablet (0.1 mg total) by mouth at bedtime. 04/25/16  Yes Rita Ohara, MD  dexamethasone (DECADRON) 4 MG tablet Take 1 tablet (4 mg total) by mouth 2 (two) times daily. Patient taking differently: Take 2 mg by mouth 2 (two) times daily.  06/13/16  Yes Barton Dubois, MD  fish oil-omega-3 fatty acids 1000 MG capsule Take 1 g by mouth at bedtime.    Yes Historical Provider, MD  furosemide (LASIX) 20 MG tablet Take 1 tablet (20 mg total) by mouth daily. Patient taking differently: Take 20 mg by mouth daily as needed for fluid.  02/29/16  Yes Rita Ohara, MD  levofloxacin (LEVAQUIN) 500 MG tablet Take 1 tablet (500 mg total) by mouth daily. 06/23/16 06/29/16 Yes Patrecia Pour, MD  LORazepam (ATIVAN) 0.5 MG tablet Take 1 tab PO Q 6 hours PRN nausea or anxiety Patient taking differently: Take 0.5 mg by mouth every 6 (six) hours as needed for anxiety (nausea).  06/24/16  Yes Curt Bears, MD  methocarbamol (ROBAXIN) 500 MG tablet TAKE 1 TO 2 TABLETS BY MOUTH EVERY 6 HOURS AS NEEDED FOR MUSCLE SPASMS  12/09/15  Yes Rita Ohara, MD  Multiple Vitamin (MULTIVITAMIN WITH MINERALS) TABS Take 1 tablet by mouth at bedtime.    Yes Historical Provider, MD  ondansetron (ZOFRAN) 8 MG tablet Take 1 tablet (8 mg total) by mouth every 8 (eight) hours as needed for nausea or vomiting. 05/18/16  Yes Susanne Borders, NP  oxyCODONE (ROXICODONE) 5 MG immediate release tablet Take 1 tablet (5 mg total) by mouth every 6 (six) hours as needed for severe pain. 06/22/16  Yes Patrecia Pour, MD  pantoprazole (PROTONIX) 40 MG tablet TAKE 1 TABLET BY MOUTH TWO  TIMES DAILY 05/21/16  Yes Curt Bears, MD  Probiotic Product (ALIGN) 4 MG CAPS Take 1 capsule by mouth daily. Reported on 09/10/2015   Yes Historical Provider, MD  prochlorperazine (COMPAZINE) 10 MG tablet Take 1 tablet (10 mg total) by mouth every 6 (six) hours as needed. Patient taking differently: Take 10 mg by mouth every 6 (six) hours as needed for nausea or vomiting.  04/29/16  Yes Curt Bears, MD  tamsulosin (FLOMAX) 0.4 MG CAPS capsule Take 2 capsules (0.8 mg total) by mouth daily after supper. Patient taking differently: Take 0.8 mg by mouth 2 (two) times daily.  06/13/16  Yes Kathie Rhodes, MD  temazepam (RESTORIL) 15 MG capsule Take 1 capsule (15 mg total) by mouth at bedtime as needed. Patient taking differently: Take 15 mg by mouth at bedtime as needed for sleep.  04/08/16  Yes Curt Bears, MD  zolendronic acid (ZOMETA) 4 MG/5ML injection Inject 4 mg into the vein every 3 (three) months.    Yes Historical Provider, MD  polyethylene glycol (MIRALAX) packet Take 17 g by mouth daily as needed for mild constipation. Patient not taking: Reported on 06/24/2016 06/13/16   Barton Dubois, MD    Physical Exam: Vitals:   06/24/16 1936 06/24/16 1945 06/24/16 2000 06/24/16 2148  BP: 95/75 105/74 109/70   Pulse: 96 96  100  Resp: _0 Temp: 97.9 F (36.6 C)     TempSrc: Oral     SpO2: 97% 97%  97%  Weight: 86.2 kg (190 lb)     Height: _1  (1.753  m)      General: Not in acute distress HEENT:       Eyes: PERRL, EOMI, no scleral icterus.  ENT: No discharge from the ears and nose, no pharynx injection, no tonsillar enlargement.        Neck: No JVD, no bruit, no mass felt. Heme: No neck lymph node enlargement. Cardiac: S1/S2, RRR, No murmurs, No gallops or rubs. Respiratory:  No rales, wheezing, rhonchi or rubs. GI: Soft, nondistended, has diffused tenderness, no rebound pain, no organomegaly, BS present. GU: No hematuria Ext: 2+ pitting leg edema bilaterally. 2+DP/PT pulse bilaterally. Musculoskeletal: No joint deformities, No joint redness or warmth, no limitation of ROM in spin. Skin: No rashes.  Neuro: Alert, oriented X3, cranial nerves II-XII grossly intact, moves all extremities normally.  Psych: Patient is not psychotic, no suicidal or hemocidal ideation.  Labs on Admission: I have personally reviewed following labs and imaging studies  CBC:  Recent Labs Lab 06/19/16 1210  06/20/16 0340 06/21/16 0501 06/22/16 0419 06/24/16 1622 06/24/16 2151  WBC 5.7  --  5.6 5.6 5.0 5.6 5.3  NEUTROABS 5.3  --   --   --   --  5.0  --   HGB 10.1*  --  9.8* 8.5* 7.8* 9.2* 10.2*  HCT 27.9*  --  28.1* 23.8* 22.5* 25.3* 28.8*  MCV 89.4  --  93.7 90.8 94.1 90.4 89.7  PLT 75*  < > 83* 71* 70* 95* 109*  < > = values in this interval not displayed. Basic Metabolic Panel:  Recent Labs Lab 06/19/16 1615 06/20/16 0340 06/21/16 0501 06/24/16 1622 06/24/16 2151  NA 129* 130* 130* 126* 129*  K 3.5 3.4* 3.6 3.3* 3.2*  CL 97* 98* 99* 91* 94*  CO2 _0 GLUCOSE 145* 136* 139* 96 111*  BUN _1 CREATININE 0.82 0.80 0.69 0.86 0.99  CALCIUM 8.0* 8.0* 7.6* 8.5* 8.7*   GFR: Estimated Creatinine Clearance: 70.1 mL/min (by C-G formula based on SCr of 0.99 mg/dL). Liver Function Tests:  Recent Labs Lab 06/19/16 1210 06/19/16 1615 06/24/16 1622  AST _2 ALT _3 ALKPHOS 65 70 70  BILITOT 0.9 1.0  0.6  PROT 5.2* 5.4* 5.2*  ALBUMIN 2.6* 2.7* 2.5*    Recent Labs Lab 06/24/16 1622  LIPASE 21   No results for input(s): AMMONIA in the last 168 hours. Coagulation Profile:  Recent Labs Lab 06/19/16 1610 06/20/16 0340 06/24/16 2151  INR 1.31 1.06 1.14   Cardiac Enzymes:  Recent Labs Lab 06/24/16 1622  TROPONINI <0.03   BNP (last 3 results) No results for input(s): PROBNP in the last 8760 hours. HbA1C: No results for input(s): HGBA1C in the last 72 hours. CBG: No results for input(s): GLUCAP in the last 168 hours. Lipid Profile: No results for input(s): CHOL, HDL, LDLCALC, TRIG, CHOLHDL, LDLDIRECT in the last 72 hours. Thyroid Function Tests: No results for input(s): TSH, T4TOTAL, FREET4, T3FREE, THYROIDAB in the last 72 hours. Anemia Panel: No results for input(s): VITAMINB12, FOLATE, FERRITIN, TIBC, IRON, RETICCTPCT in the last 72 hours. Urine analysis:    Component Value Date/Time   COLORURINE YELLOW 06/24/2016 [place] 06/24/2016 1611   LABSPEC 1.008 06/24/2016 1611   LABSPEC 1.005 05/09/2016 0937   PHURINE 6.0 06/24/2016 1611   GLUCOSEU NEGATIVE 06/24/2016 1611   GLUCOSEU Negative 05/09/2016 0937   HGBUR NEGATIVE 06/24/2016 1611   BILIRUBINUR NEGATIVE 06/24/2016 1611   BILIRUBINUR Negative 05/09/2016 [place] 06/24/2016 1611   PROTEINUR NEGATIVE 06/24/2016 1611   UROBILINOGEN 0.2 05/09/2016 2761  NITRITE NEGATIVE 06/24/2016 1611   LEUKOCYTESUR NEGATIVE 06/24/2016 1611   LEUKOCYTESUR Negative 05/09/2016 0937   Sepsis Labs: _0 (procalcitonin:4,lacticidven:4) ) Recent Results (from the past 240 hour(s))  MRSA PCR Screening     Status: None   Collection Time: 06/19/16  4:10 PM  Result Value Ref Range Status   MRSA by PCR NEGATIVE NEGATIVE Final    Comment:        The GeneXpert MRSA Assay (FDA approved for NASAL specimens only), is one component of a comprehensive MRSA colonization surveillance program. It  is not intended to diagnose MRSA infection nor to guide or monitor treatment for MRSA infections.      Radiological Exams on Admission: Dg Chest 2 View  Result Date: 06/24/2016 CLINICAL DATA:  Weakness and shortness of Breath EXAM: CHEST  2 VIEW COMPARISON:  06/19/2016 FINDINGS: Cardiac shadow is enlarged. Right chest wall port is again seen. Patchy infiltrates are again noted in the right lung. Improved aeration in the left base is noted. No new focal abnormality is seen. No bony abnormality is noted. IMPRESSION: Improved aeration on the left with stable infiltrates on the right. Electronically Signed   By: Inez Catalina M.D.   On: 06/24/2016 16:38   Ct Head Wo Contrast  Result Date: 06/24/2016 CLINICAL DATA:  Weakness, nausea, shortness of breath, diagnosed with pulmonary embolism on 06/19/2016, headache, history melanoma, multiple myeloma, lung cancer EXAM: CT HEAD WITHOUT CONTRAST TECHNIQUE: Contiguous axial images were obtained from the base of the skull through the vertex without intravenous contrast. COMPARISON:  06/27/2016 FINDINGS: Brain: Generalized atrophy. Normal ventricular morphology. No midline shift or mass effect. Small vessel chronic ischemic changes of deep cerebral white matter. Previously identified metastases within the RIGHT cerebellum and RIGHT occipital lobe on prior contrast-enhanced CT and prior MR brain not identified by this noncontrast exam. No intracranial hemorrhage, visualized mass lesion, evidence acute infarction, or extra-axial fluid collection. Vascular: Unremarkable Skull: Intact Sinuses/Orbits: Probable small osteoma of the LEFT frontal sinus. Remaining paranasal sinuses and mastoid air cells clear. Other: N/A IMPRESSION: Atrophy with small vessel chronic ischemic changes of deep cerebral white matter. No acute intracranial abnormalities. Enhancing lesions within the RIGHT cerebellum and RIGHT occipital lobe consistent with metastases as identified on the  previous exam are not identified by the current noncontrast study. Electronically Signed   By: Lavonia Dana M.D.   On: 06/24/2016 17:54   Ct Angio Chest Pe W And/or Wo Contrast  Result Date: 06/24/2016 CLINICAL DATA:  Shortness of breath. Recently diagnosed with pulmonary embolism. Taking Eliquis. Right chest pain. Stage IV metastatic small cell lung cancer. Multiple myeloma. EXAM: CT ANGIOGRAPHY CHEST CT ABDOMEN AND PELVIS WITH CONTRAST TECHNIQUE: Multidetector CT imaging of the chest was performed using the standard protocol during bolus administration of intravenous contrast. Multiplanar CT image reconstructions and MIPs were obtained to evaluate the vascular anatomy. Multidetector CT imaging of the abdomen and pelvis was performed using the standard protocol during bolus administration of intravenous contrast. CONTRAST:  100 cc Isovue 370 COMPARISON:  Chest CTA dated 06/19/2016 and abdomen and pelvis CT dated 06/10/2016. FINDINGS: CTA CHEST FINDINGS Cardiovascular: Mildly progressive subsegmental pulmonary embolus in the right lower lobe. The remainder of the previously demonstrated segmental and subsegmental right lower lobe and right upper lobe pulmonary emboli have not changed significantly. No definite left lower lobe pulmonary emboli are seen today. Mediastinum/Nodes: The previously demonstrated hypodense left lobe thyroid nodule is not clearly visible today. The previously demonstrated 1.5 cm short axis right peritracheal node  has a short axis diameter of 1.5 cm on image number 30 of series 4. The previously demonstrated 1.2 cm short axis subcarinal node right has a short axis diameter of 1.4 cm on image number 38 of series 4. The previously demonstrated 1.7 cm short axis diameter right hilar node has a short axis diameter of 1.8 cm on image number 38 of series 4. A previously demonstrated 1.2 cm short axis left hilar node has a short axis diameter of 1.3 cm on image number 40 of series 4.  Lungs/Pleura: No significant change in a small right pleural effusion. The previously demonstrated 1.5 x 1.2 cm irregular central right upper lobe nodule 1 measures 1.4 x 1.0 cm on image number 33 of series 12. The previously demonstrated 3 mm right middle lobe nodule measures 6 mm on image number 38 of series 12. Mildly progressive consolidated right middle lobe atelectasis. Progressive patchy interstitial opacities in both upper lobes. Mild right lower lobe atelectasis with improvement. Musculoskeletal: Thoracic spine degenerative changes. Review of the MIP images confirms the above findings. CT ABDOMEN and PELVIS FINDINGS Hepatobiliary: No focal liver abnormality is seen. No gallstones, gallbladder wall thickening, or biliary dilatation. A small gallbladder fundal nodule with enhancement is again demonstrated. Pancreas: Diffusely atrophied. Spleen: Normal in size without focal abnormality. Adrenals/Urinary Tract: Stable left renal cortical scar. Normal appearing adrenal glands and right kidney. Distended urinary bladder with mildly dilated ureters. No hydronephrosis. Stomach/Bowel: Interval multiple dilated proximal small bowel loops with normal caliber distal small bowel loops. Unremarkable stomach and colon. No evidence of appendicitis. Vascular/Lymphatic: Mild atheromatous arterial calcifications, including the abdominal aorta. No enlarged lymph nodes. Reproductive: Minimally enlarged prostate gland. Other: Tiny amount of free peritoneal fluid in the posterior pelvis on the right. Musculoskeletal: Lumbar spine degenerative changes. Review of the MIP images confirms the above findings. IMPRESSION: 1. Mildly progressive right lower lobe pulmonary emboli. 2. Stable central right upper lobe pulmonary emboli. 3. Interval obstruction of the mid small bowel. The cause of obstruction is not identified. This is most likely due to an adhesion. 4. Stable small enhancing mass in the gallbladder fundus. 5. Progressive  patchy interstitial opacities in both lungs. This could be due to infection, drug allergy, or patchy interstitial edema. 6. Stable small right pleural effusion with decreased right lower lobe atelectasis. 7. Little change in 2 right lung nodules. 8. Little change in mediastinal and bilateral hilar adenopathy. 9. Mild aortic atherosclerosis. Electronically Signed   By: Claudie Revering M.D.   On: 06/24/2016 18:30   Ct Abdomen Pelvis W Contrast  Result Date: 06/24/2016 CLINICAL DATA:  Shortness of breath. Recently diagnosed with pulmonary embolism. Taking Eliquis. Right chest pain. Stage IV metastatic small cell lung cancer. Multiple myeloma. EXAM: CT ANGIOGRAPHY CHEST CT ABDOMEN AND PELVIS WITH CONTRAST TECHNIQUE: Multidetector CT imaging of the chest was performed using the standard protocol during bolus administration of intravenous contrast. Multiplanar CT image reconstructions and MIPs were obtained to evaluate the vascular anatomy. Multidetector CT imaging of the abdomen and pelvis was performed using the standard protocol during bolus administration of intravenous contrast. CONTRAST:  100 cc Isovue 370 COMPARISON:  Chest CTA dated 06/19/2016 and abdomen and pelvis CT dated 06/10/2016. FINDINGS: CTA CHEST FINDINGS Cardiovascular: Mildly progressive subsegmental pulmonary embolus in the right lower lobe. The remainder of the previously demonstrated segmental and subsegmental right lower lobe and right upper lobe pulmonary emboli have not changed significantly. No definite left lower lobe pulmonary emboli are seen today. Mediastinum/Nodes: The previously demonstrated  hypodense left lobe thyroid nodule is not clearly visible today. The previously demonstrated 1.5 cm short axis right peritracheal node has a short axis diameter of 1.5 cm on image number 30 of series 4. The previously demonstrated 1.2 cm short axis subcarinal node right has a short axis diameter of 1.4 cm on image number 38 of series 4. The  previously demonstrated 1.7 cm short axis diameter right hilar node has a short axis diameter of 1.8 cm on image number 38 of series 4. A previously demonstrated 1.2 cm short axis left hilar node has a short axis diameter of 1.3 cm on image number 40 of series 4. Lungs/Pleura: No significant change in a small right pleural effusion. The previously demonstrated 1.5 x 1.2 cm irregular central right upper lobe nodule 1 measures 1.4 x 1.0 cm on image number 33 of series 12. The previously demonstrated 3 mm right middle lobe nodule measures 6 mm on image number 38 of series 12. Mildly progressive consolidated right middle lobe atelectasis. Progressive patchy interstitial opacities in both upper lobes. Mild right lower lobe atelectasis with improvement. Musculoskeletal: Thoracic spine degenerative changes. Review of the MIP images confirms the above findings. CT ABDOMEN and PELVIS FINDINGS Hepatobiliary: No focal liver abnormality is seen. No gallstones, gallbladder wall thickening, or biliary dilatation. A small gallbladder fundal nodule with enhancement is again demonstrated. Pancreas: Diffusely atrophied. Spleen: Normal in size without focal abnormality. Adrenals/Urinary Tract: Stable left renal cortical scar. Normal appearing adrenal glands and right kidney. Distended urinary bladder with mildly dilated ureters. No hydronephrosis. Stomach/Bowel: Interval multiple dilated proximal small bowel loops with normal caliber distal small bowel loops. Unremarkable stomach and colon. No evidence of appendicitis. Vascular/Lymphatic: Mild atheromatous arterial calcifications, including the abdominal aorta. No enlarged lymph nodes. Reproductive: Minimally enlarged prostate gland. Other: Tiny amount of free peritoneal fluid in the posterior pelvis on the right. Musculoskeletal: Lumbar spine degenerative changes. Review of the MIP images confirms the above findings. IMPRESSION: 1. Mildly progressive right lower lobe pulmonary  emboli. 2. Stable central right upper lobe pulmonary emboli. 3. Interval obstruction of the mid small bowel. The cause of obstruction is not identified. This is most likely due to an adhesion. 4. Stable small enhancing mass in the gallbladder fundus. 5. Progressive patchy interstitial opacities in both lungs. This could be due to infection, drug allergy, or patchy interstitial edema. 6. Stable small right pleural effusion with decreased right lower lobe atelectasis. 7. Little change in 2 right lung nodules. 8. Little change in mediastinal and bilateral hilar adenopathy. 9. Mild aortic atherosclerosis. Electronically Signed   By: Claudie Revering M.D.   On: 06/24/2016 18:30     EKG: Independently reviewed.  Sinus rhythm, QTC 443, early R-wave progression  Assessment/Plan Principal Problem:   SBO (small bowel obstruction) Active Problems:   Hyponatremia   History of pulmonary embolism   Acute deep vein thrombosis (DVT) of tibial vein of right lower extremity (HCC)   Headache   Long term current use of anticoagulant therapy   Brain metastases (HCC)   Adenocarcinoma of right lung, stage 4 (HCC)   Hypokalemia   Thrombocytopenia (HCC)   Malnutrition of moderate degree   GERD (gastroesophageal reflux disease)   Chronic diastolic CHF (congestive heart failure) (HCC)   GIB (gastrointestinal bleeding)   SBO: this is recurrent partial small  bowel obstruction. General surgeon, Dr. Malachi Paradise was consulted-->concern for carcinomatosis per Dr. Brantley Stage, recommended NG tube and IV fluids. Pt is a poor operative candidate for surgery.  -will  admit to sdu as inpt -Highly appreciate Dr. Josetta Huddle recommendations -IV fluids: 2L NS and then 100 cc/h -NG tube decompression. -Zofran for nausea and morphine for pain  Pulmonary Emboli: he has worsening PE by CT angiogram of chest. He has hx of possible HIT, currently on Eliquis. He also has positive FOBT, but hgb stable. PCCM, Dr. Ancil Linsey was consulted-->agreed to  with with argatroban while inpatient (in the case of need for procedures). -IV argatroban per pharm  -pt is also on Levaquine at home (started at discharge on 12/13 and is supposed to take until 12/20), will continue. -repeat 2d echo to r/oright heart straining.  Positive FOBT: Patient does not seem to have significant GI bleeding since hemoglobin is stable. - Start IV pantoprazole 40 mg bib - Zofran IV for nausea - Avoid NSAIDs and SQ heparin - Maintain IV access (2 large bore IVs if possible). - Monitor closely and follow q6h cbc, transfuse as necessary. - LaB: INR, PTT, type/screen  Stage IV Lung Cancer: patient has been followed up with Dr. [name]. Did not have surgery. On immunotherapy, last dose of Lars Mage was 6 weeks ago per pt.  -f/u with Dr. [name]  Chronic dCHF: 2-D echo on 06/2016 showed EF 60-65 percent with grade 1 diastolic dysfunction. He has 2+ leg edema, but no JVD and no pulmonary edema on chest x-ray,  BNP 94.7, does not seem to have acute exacerbation. -hold lasix due to hypotension and hyponatremia -watch volume status closely while receiving IV fluid  Hyponatremia: sodium was 126. Mental status normal. Most likely due to multifactorial etiology, including DDAVP use, decreasing oral intake and Lasix use. -Hold her Lasix -IV normal saline as above -BMP every 6 hours -Hold DDAVP - Will check urine sodium, urine osmolality, serum osmolality. - check TSH  Headache: most likely due to primary metastasized disease. MRI of brain on 05/12/16 showed metastasized disease. -Continue Decadron 2.0 mg twice a day -Solu Cortef 50 mg 1 as stress dose -Check cortisol level  Hypokalemia: K= 3.3 on admission. - Repleted - Check Mg level  Malnutrition of moderate degree: -nutrition consult  GERD: -Protonix  BPH: -hold flomax due to low Bp. -Foley cath if develops urinary retention   DVT ppx: On Argatroban IV Code Status: Full code Family Communication: Yes,  patient's wife at bed side Disposition Plan:  Anticipate discharge back to previous home environment Consults called:  PCCM, Dr. Ancil Linsey and general surgery, Dr Brantley Stage were consulted. Admission status:  SDU/inpation       Date of Service 06/24/2016    Ivor Costa [facility] Pager [phone]  If 7PM-7AM, please contact night-coverage [email or web address] Password [facility] 06/24/2016, 11:37 PM

## 2016-06-24 NOTE — ED Triage Notes (Signed)
Pt dx with PE on 12/10.  Taking eloquis.  Pt states since then he has felt weak, nausea, shortness of breath.  Rt leg has weeping wounds. Pt c/o pain in rt lung.  States also has headache.

## 2016-06-24 NOTE — ED Notes (Signed)
RN accessing port

## 2016-06-24 NOTE — ED Provider Notes (Signed)
Medical screening examination/treatment/procedure(s) were conducted as a shared visit with non-physician practitioner(s) and myself.  I personally evaluated the patient during the encounter.  Recently discharged with PE's likely from being on Heparin on previous hospitalization. Returns here with mulitple complaints to include headache, nausea, decreased defecation, worsening dyspnea, abdominal distension.  Exam reveals a chronically ill man but in no distress. Wearing O2 for comfort. Abdomen slightly distended but diffusely tender. High pitched bowel sounds, no fluid wave. Lungs with mild crackles and wheezing diffusely.   Concern for multiple things: early pulm HTN from PE, worsening PE, pneumonia, head bleed, small bowel obstruction.  Will eval appropriately but likely needs admission at least for SOB and eval for worsening R heart strain.    EKG Interpretation  Date/Time:  Friday June 24 2016 16:07:33 EST Ventricular Rate:  95 PR Interval:    QRS Duration: 118 QT Interval:  361 QTC Calculation: 931 R Axis:   3 Text Interpretation:  Sinus rhythm Incomplete right bundle branch block Borderline low voltage, extremity leads No significant change since last tracing Confirmed by [facility] MD, Doni Bacha [phone]) on 06/25/2016 10:20:47 AM         Merrily Pew, MD 06/25/16 1023

## 2016-06-24 NOTE — Consult Note (Signed)
PULMONARY  / CRITICAL CARE MEDICINE CONSULTATION   Name: [name] MRN: [id number] DOB: [birth year]    ADMISSION DATE:  06/24/2016 CONSULTATION DATE: June 24, 2016  REQUESTING CLINICIAN: Ivor Costa, MD PRIMARY SERVICE: Hospitalist  CHIEF COMPLAINT:  Abdominal pain  BRIEF PATIENT DESCRIPTION: 75 y/o man with hx of NSCLC (tx w/ Prembrolizomab) and recent dx of SBO c/b PE on apixiban who presents with severe abdominal pain.  SIGNIFICANT EVENTS / STUDIES:  CT Chest 12/15 >> relatively stable multifocal PEs (slight progression in RLL), mild infiltrate CT Abd 12/15 >> SBO  LINES / TUBES: Port (NGT ordered, not yet placed)  CULTURES: None  ANTIBIOTICS: None  HISTORY OF PRESENT ILLNESS:   [name] is a pleasant 75 y/o man with a hx of recently diagnosed stage IV NSCLC (80% PDL1+) being treated with pembrolizomab (immunotherapy) who was recently admitted 11/26 - 12/4 for SBO (thought to be due to adhesions) as well as a subsequent admission 12/10-12/13 for multifocal PE (c/b possible HIT). He was ultimately discharged on apixiban, which he has taken for the last two days. He does note that he has been regularly taking oxycodone, but has not been taking any laxatives as the miralax he was taking gave him diarrhea. He states he has not moved his bowels in the last two days. He has not been vomiting. This morning he developed severe abdominal pain and presented to the ED. He has a FOBT which was positive, raising the spectre of a GIB, particularly given his [facility] state. PCCM was consulted with regard to [facility] plan, possible management of GIB.  PAST MEDICAL HISTORY :  Past Medical History:  Diagnosis Date  . Adenocarcinoma of right lung, stage 4 ([place]) 05/27/2016  . Adenomatous polyp of colon 1996  . Arthritis    Osteoarthritis right knee, spine, wrist  . Arthritis of hand, right    Right thumb (Dr. Daylene Katayama)  . Back ache    r/o Multiple Myeloma  . Benign prostatic hypertrophy    (Dr.  Karsten Ro in past)  . Degenerative disc disease   . Diverticulosis   . DVT (deep venous thrombosis) ([place]) 01/2014   R calf  . Encounter for antineoplastic chemotherapy 10/20/2015  . Erosive esophagitis   . GERD (gastroesophageal reflux disease)   . Hemorrhoids   . Hiatal hernia   . History of chronic prostatitis   . History of melanoma    Back (Dr. Wilhemina Bonito); early melanoma R arm 03/2011  . Multiple myeloma ([place]) 2013   skull lytic lesions--treated with radiation 07/2014  . Pulmonary embolism ([place]) 01/2014  . Radiation 07/21/14-08/05/14   left occipital parietal skull 25 gray  . SCC (squamous cell carcinoma) 08/04/15   right ear;dysplastic nevus with severe atypia-chest   Past Surgical History:  Procedure Laterality Date  . APPENDECTOMY    . BONE MARROW TRANSPLANT  03/2013   Stem Cell  . COLONOSCOPY  2009   adenomatous polyp  . ESOPHAGOGASTRODUODENOSCOPY  2009   mild inflammation at esophagogastric junction  . excision of melanoma     back ( x3)  . Great toe surgery     bilateral  . INGUINAL HERNIA REPAIR     left, x 2  . port placemet  10/2012  . SCALENE NODE BIOPSY Right 05/11/2016   Procedure: BIOPSY RIGHT SCALENE NODE;  Surgeon: Grace Isaac, MD;  Location: [place];  Service: Thoracic;  Laterality: Right;  . TOTAL KNEE ARTHROPLASTY  06/08/2011   Procedure: TOTAL KNEE ARTHROPLASTY;  Surgeon: Ninetta Lights, MD;  Location: [place];  Service: Orthopedics;  Laterality: Right;  osteonics  . total knee replaced  2000   Left knee   Prior to Admission medications   Medication Sig Start Date End Date Taking? Authorizing Provider  acetaminophen (TYLENOL) 500 MG tablet Take 500 mg by mouth every 6 (six) hours as needed for moderate pain or headache.   Yes Historical Provider, MD  apixaban (ELIQUIS) 5 MG TABS tablet Take 2 tabs ('10mg'$ ) po BID for 7 days, then take 1 tab ('5mg'$ ) po BID 06/22/16  Yes Patrecia Pour, MD  ascorbic acid (VITAMIN C) 500 MG tablet Take 500 mg by mouth 2 (two) times  daily.    Yes Historical Provider, MD  cholecalciferol (VITAMIN D) 1000 UNITS tablet Take 1,000 Units by mouth at bedtime.    Yes Historical Provider, MD  desmopressin (DDAVP) 0.1 MG tablet Take 1 tablet (0.1 mg total) by mouth at bedtime. 04/25/16  Yes Rita Ohara, MD  dexamethasone (DECADRON) 4 MG tablet Take 1 tablet (4 mg total) by mouth 2 (two) times daily. Patient taking differently: Take 2 mg by mouth 2 (two) times daily.  06/13/16  Yes Barton Dubois, MD  fish oil-omega-3 fatty acids 1000 MG capsule Take 1 g by mouth at bedtime.    Yes Historical Provider, MD  furosemide (LASIX) 20 MG tablet Take 1 tablet (20 mg total) by mouth daily. Patient taking differently: Take 20 mg by mouth daily as needed for fluid.  02/29/16  Yes Rita Ohara, MD  levofloxacin (LEVAQUIN) 500 MG tablet Take 1 tablet (500 mg total) by mouth daily. 06/23/16 06/29/16 Yes Patrecia Pour, MD  LORazepam (ATIVAN) 0.5 MG tablet Take 1 tab PO Q 6 hours PRN nausea or anxiety Patient taking differently: Take 0.5 mg by mouth every 6 (six) hours as needed for anxiety (nausea).  06/24/16  Yes Curt Bears, MD  methocarbamol (ROBAXIN) 500 MG tablet TAKE 1 TO 2 TABLETS BY MOUTH EVERY 6 HOURS AS NEEDED FOR MUSCLE SPASMS 12/09/15  Yes Rita Ohara, MD  Multiple Vitamin (MULTIVITAMIN WITH MINERALS) TABS Take 1 tablet by mouth at bedtime.    Yes Historical Provider, MD  ondansetron (ZOFRAN) 8 MG tablet Take 1 tablet (8 mg total) by mouth every 8 (eight) hours as needed for nausea or vomiting. 05/18/16  Yes Susanne Borders, NP  oxyCODONE (ROXICODONE) 5 MG immediate release tablet Take 1 tablet (5 mg total) by mouth every 6 (six) hours as needed for severe pain. 06/22/16  Yes Patrecia Pour, MD  pantoprazole (PROTONIX) 40 MG tablet TAKE 1 TABLET BY MOUTH TWO  TIMES DAILY 05/21/16  Yes Curt Bears, MD  Probiotic Product (ALIGN) 4 MG CAPS Take 1 capsule by mouth daily. Reported on 09/10/2015   Yes Historical Provider, MD  prochlorperazine (COMPAZINE)  10 MG tablet Take 1 tablet (10 mg total) by mouth every 6 (six) hours as needed. Patient taking differently: Take 10 mg by mouth every 6 (six) hours as needed for nausea or vomiting.  04/29/16  Yes Curt Bears, MD  tamsulosin (FLOMAX) 0.4 MG CAPS capsule Take 2 capsules (0.8 mg total) by mouth daily after supper. Patient taking differently: Take 0.8 mg by mouth 2 (two) times daily.  06/13/16  Yes Kathie Rhodes, MD  temazepam (RESTORIL) 15 MG capsule Take 1 capsule (15 mg total) by mouth at bedtime as needed. Patient taking differently: Take 15 mg by mouth at bedtime as needed for sleep.  04/08/16  Yes Curt Bears, MD  zolendronic acid (ZOMETA) 4 MG/5ML injection Inject 4 mg into the vein every 3 (three) months.    Yes Historical Provider, MD  polyethylene glycol (MIRALAX) packet Take 17 g by mouth daily as needed for mild constipation. Patient not taking: Reported on 06/24/2016 06/13/16   Barton Dubois, MD   Allergies  Allergen Reactions  . Tramadol Palpitations    "Made my chest feel tight"  . Amoxicillin     REACTION: rash  . Penicillins Rash    Has patient had a PCN reaction causing immediate rash, facial/tongue/throat swelling, SOB or lightheadedness with hypotension: unknown Has patient had a PCN reaction causing severe rash involving mucus membranes or skin necrosis: unknown Has patient had a PCN reaction that required hospitalization: no  Has patient had a PCN reaction occurring within the last 10 years: no  If all of the above answers are "NO", then may proceed with Cephalosporin use.     FAMILY HISTORY:  Family History  Problem Relation Age of Onset  . Dementia Mother   . Stroke Father     brainstem stroke  . Hypertension Father   . Diabetes Father   . Prostate cancer Maternal Grandfather   . Depression Daughter   . Anesthesia problems Neg Hx    SOCIAL HISTORY:  reports that he quit smoking about 39 years ago. His smoking use included Cigarettes. He has a 30.00  pack-year smoking history. He has never used smokeless tobacco. He reports that he drinks about 1.5 oz of alcohol per week . He reports that he does not use drugs.  REVIEW OF SYSTEMS:  Per HPI  SUBJECTIVE:   VITAL SIGNS: Temp:  [97.7 F (36.5 C)-97.9 F (36.6 C)] 97.9 F (36.6 C) (12/15 1936) Pulse Rate:  [82-96] 96 (12/15 1945) Resp:  [11-23] 11 (12/15 2000) BP: (86-120)/(62-82) 109/70 (12/15 2000) SpO2:  [95 %-99 %] 97 % (12/15 1945) Weight:  [190 lb (86.2 kg)] 190 lb (86.2 kg) (12/15 1936) HEMODYNAMICS:   VENTILATOR SETTINGS:   INTAKE / OUTPUT: Intake/Output      12/15 0701 - 12/16 0700   IV Piggyback 1000   Total Intake(mL/kg) 1000 (11.6)   Net +1000         PHYSICAL EXAMINATION: General:  Elderly man with intermittent severe distress due to abdominal pain. Neuro:  Awake, alert, fully oriented. No gross deficits. HEENT:  Dry MM. Neck: No JVD Cardiovascular:  RRR Lungs:  CTA Abdomen:  Distended, painful. Musculoskeletal:  No deformities Skin:  Scattered ecchymoses.  LABS:  CBC  Recent Labs Lab 06/21/16 0501 06/22/16 0419 06/24/16 1622  WBC 5.6 5.0 5.6  HGB 8.5* 7.8* 9.2*  HCT 23.8* 22.5* 25.3*  PLT 71* 70* 95*   Coag's  Recent Labs Lab 06/19/16 1610 06/20/16 0340  06/20/16 1831 06/21/16 0652 06/22/16 0520  APTT 34 27  < > 74* 62* 75*  INR 1.31 1.06  --   --   --   --   < > = values in this interval not displayed. BMET  Recent Labs Lab 06/20/16 0340 06/21/16 0501 06/24/16 1622  NA 130* 130* 126*  K 3.4* 3.6 3.3*  CL 98* 99* 91*  CO2 '25 26 26  '$ BUN '10 12 11  '$ CREATININE 0.80 0.69 0.86  GLUCOSE 136* 139* 96   Electrolytes  Recent Labs Lab 06/20/16 0340 06/21/16 0501 06/24/16 1622  CALCIUM 8.0* 7.6* 8.5*   Sepsis Markers  Recent Labs Lab 06/24/16 2012  LATICACIDVEN 0.96  ABG No results for input(s): PHART, PCO2ART, PO2ART in the last 168 hours. Liver Enzymes  Recent Labs Lab 06/19/16 1210 06/19/16 1615  06/24/16 1622  AST '15 17 18  '$ ALT '21 21 24  '$ ALKPHOS 65 70 70  BILITOT 0.9 1.0 0.6  ALBUMIN 2.6* 2.7* 2.5*   Cardiac Enzymes  Recent Labs Lab 06/24/16 1622  TROPONINI <0.03   Glucose No results for input(s): GLUCAP in the last 168 hours.  Imaging Dg Chest 2 View  Result Date: 06/24/2016 CLINICAL DATA:  Weakness and shortness of Breath EXAM: CHEST  2 VIEW COMPARISON:  06/19/2016 FINDINGS: Cardiac shadow is enlarged. Right chest wall port is again seen. Patchy infiltrates are again noted in the right lung. Improved aeration in the left base is noted. No new focal abnormality is seen. No bony abnormality is noted. IMPRESSION: Improved aeration on the left with stable infiltrates on the right. Electronically Signed   By: Inez Catalina M.D.   On: 06/24/2016 16:38   Ct Head Wo Contrast  Result Date: 06/24/2016 CLINICAL DATA:  Weakness, nausea, shortness of breath, diagnosed with pulmonary embolism on 06/19/2016, headache, history melanoma, multiple myeloma, lung cancer EXAM: CT HEAD WITHOUT CONTRAST TECHNIQUE: Contiguous axial images were obtained from the base of the skull through the vertex without intravenous contrast. COMPARISON:  06/27/2016 FINDINGS: Brain: Generalized atrophy. Normal ventricular morphology. No midline shift or mass effect. Small vessel chronic ischemic changes of deep cerebral white matter. Previously identified metastases within the RIGHT cerebellum and RIGHT occipital lobe on prior contrast-enhanced CT and prior MR brain not identified by this noncontrast exam. No intracranial hemorrhage, visualized mass lesion, evidence acute infarction, or extra-axial fluid collection. Vascular: Unremarkable Skull: Intact Sinuses/Orbits: Probable small osteoma of the LEFT frontal sinus. Remaining paranasal sinuses and mastoid air cells clear. Other: N/A IMPRESSION: Atrophy with small vessel chronic ischemic changes of deep cerebral white matter. No acute intracranial abnormalities.  Enhancing lesions within the RIGHT cerebellum and RIGHT occipital lobe consistent with metastases as identified on the previous exam are not identified by the current noncontrast study. Electronically Signed   By: Lavonia Dana M.D.   On: 06/24/2016 17:54   Ct Angio Chest Pe W And/or Wo Contrast  Result Date: 06/24/2016 CLINICAL DATA:  Shortness of breath. Recently diagnosed with pulmonary embolism. Taking Eliquis. Right chest pain. Stage IV metastatic small cell lung cancer. Multiple myeloma. EXAM: CT ANGIOGRAPHY CHEST CT ABDOMEN AND PELVIS WITH CONTRAST TECHNIQUE: Multidetector CT imaging of the chest was performed using the standard protocol during bolus administration of intravenous contrast. Multiplanar CT image reconstructions and MIPs were obtained to evaluate the vascular anatomy. Multidetector CT imaging of the abdomen and pelvis was performed using the standard protocol during bolus administration of intravenous contrast. CONTRAST:  100 cc Isovue 370 COMPARISON:  Chest CTA dated 06/19/2016 and abdomen and pelvis CT dated 06/10/2016. FINDINGS: CTA CHEST FINDINGS Cardiovascular: Mildly progressive subsegmental pulmonary embolus in the right lower lobe. The remainder of the previously demonstrated segmental and subsegmental right lower lobe and right upper lobe pulmonary emboli have not changed significantly. No definite left lower lobe pulmonary emboli are seen today. Mediastinum/Nodes: The previously demonstrated hypodense left lobe thyroid nodule is not clearly visible today. The previously demonstrated 1.5 cm short axis right peritracheal node has a short axis diameter of 1.5 cm on image number 30 of series 4. The previously demonstrated 1.2 cm short axis subcarinal node right has a short axis diameter of 1.4 cm on image number 38 of series 4.  The previously demonstrated 1.7 cm short axis diameter right hilar node has a short axis diameter of 1.8 cm on image number 38 of series 4. A previously  demonstrated 1.2 cm short axis left hilar node has a short axis diameter of 1.3 cm on image number 40 of series 4. Lungs/Pleura: No significant change in a small right pleural effusion. The previously demonstrated 1.5 x 1.2 cm irregular central right upper lobe nodule 1 measures 1.4 x 1.0 cm on image number 33 of series 12. The previously demonstrated 3 mm right middle lobe nodule measures 6 mm on image number 38 of series 12. Mildly progressive consolidated right middle lobe atelectasis. Progressive patchy interstitial opacities in both upper lobes. Mild right lower lobe atelectasis with improvement. Musculoskeletal: Thoracic spine degenerative changes. Review of the MIP images confirms the above findings. CT ABDOMEN and PELVIS FINDINGS Hepatobiliary: No focal liver abnormality is seen. No gallstones, gallbladder wall thickening, or biliary dilatation. A small gallbladder fundal nodule with enhancement is again demonstrated. Pancreas: Diffusely atrophied. Spleen: Normal in size without focal abnormality. Adrenals/Urinary Tract: Stable left renal cortical scar. Normal appearing adrenal glands and right kidney. Distended urinary bladder with mildly dilated ureters. No hydronephrosis. Stomach/Bowel: Interval multiple dilated proximal small bowel loops with normal caliber distal small bowel loops. Unremarkable stomach and colon. No evidence of appendicitis. Vascular/Lymphatic: Mild atheromatous arterial calcifications, including the abdominal aorta. No enlarged lymph nodes. Reproductive: Minimally enlarged prostate gland. Other: Tiny amount of free peritoneal fluid in the posterior pelvis on the right. Musculoskeletal: Lumbar spine degenerative changes. Review of the MIP images confirms the above findings. IMPRESSION: 1. Mildly progressive right lower lobe pulmonary emboli. 2. Stable central right upper lobe pulmonary emboli. 3. Interval obstruction of the mid small bowel. The cause of obstruction is not identified.  This is most likely due to an adhesion. 4. Stable small enhancing mass in the gallbladder fundus. 5. Progressive patchy interstitial opacities in both lungs. This could be due to infection, drug allergy, or patchy interstitial edema. 6. Stable small right pleural effusion with decreased right lower lobe atelectasis. 7. Little change in 2 right lung nodules. 8. Little change in mediastinal and bilateral hilar adenopathy. 9. Mild aortic atherosclerosis. Electronically Signed   By: Claudie Revering M.D.   On: 06/24/2016 18:30   Ct Abdomen Pelvis W Contrast  Result Date: 06/24/2016 CLINICAL DATA:  Shortness of breath. Recently diagnosed with pulmonary embolism. Taking Eliquis. Right chest pain. Stage IV metastatic small cell lung cancer. Multiple myeloma. EXAM: CT ANGIOGRAPHY CHEST CT ABDOMEN AND PELVIS WITH CONTRAST TECHNIQUE: Multidetector CT imaging of the chest was performed using the standard protocol during bolus administration of intravenous contrast. Multiplanar CT image reconstructions and MIPs were obtained to evaluate the vascular anatomy. Multidetector CT imaging of the abdomen and pelvis was performed using the standard protocol during bolus administration of intravenous contrast. CONTRAST:  100 cc Isovue 370 COMPARISON:  Chest CTA dated 06/19/2016 and abdomen and pelvis CT dated 06/10/2016. FINDINGS: CTA CHEST FINDINGS Cardiovascular: Mildly progressive subsegmental pulmonary embolus in the right lower lobe. The remainder of the previously demonstrated segmental and subsegmental right lower lobe and right upper lobe pulmonary emboli have not changed significantly. No definite left lower lobe pulmonary emboli are seen today. Mediastinum/Nodes: The previously demonstrated hypodense left lobe thyroid nodule is not clearly visible today. The previously demonstrated 1.5 cm short axis right peritracheal node has a short axis diameter of 1.5 cm on image number 30 of series 4. The previously demonstrated 1.2 cm  short axis subcarinal node right has a short axis diameter of 1.4 cm on image number 38 of series 4. The previously demonstrated 1.7 cm short axis diameter right hilar node has a short axis diameter of 1.8 cm on image number 38 of series 4. A previously demonstrated 1.2 cm short axis left hilar node has a short axis diameter of 1.3 cm on image number 40 of series 4. Lungs/Pleura: No significant change in a small right pleural effusion. The previously demonstrated 1.5 x 1.2 cm irregular central right upper lobe nodule 1 measures 1.4 x 1.0 cm on image number 33 of series 12. The previously demonstrated 3 mm right middle lobe nodule measures 6 mm on image number 38 of series 12. Mildly progressive consolidated right middle lobe atelectasis. Progressive patchy interstitial opacities in both upper lobes. Mild right lower lobe atelectasis with improvement. Musculoskeletal: Thoracic spine degenerative changes. Review of the MIP images confirms the above findings. CT ABDOMEN and PELVIS FINDINGS Hepatobiliary: No focal liver abnormality is seen. No gallstones, gallbladder wall thickening, or biliary dilatation. A small gallbladder fundal nodule with enhancement is again demonstrated. Pancreas: Diffusely atrophied. Spleen: Normal in size without focal abnormality. Adrenals/Urinary Tract: Stable left renal cortical scar. Normal appearing adrenal glands and right kidney. Distended urinary bladder with mildly dilated ureters. No hydronephrosis. Stomach/Bowel: Interval multiple dilated proximal small bowel loops with normal caliber distal small bowel loops. Unremarkable stomach and colon. No evidence of appendicitis. Vascular/Lymphatic: Mild atheromatous arterial calcifications, including the abdominal aorta. No enlarged lymph nodes. Reproductive: Minimally enlarged prostate gland. Other: Tiny amount of free peritoneal fluid in the posterior pelvis on the right. Musculoskeletal: Lumbar spine degenerative changes. Review of the  MIP images confirms the above findings. IMPRESSION: 1. Mildly progressive right lower lobe pulmonary emboli. 2. Stable central right upper lobe pulmonary emboli. 3. Interval obstruction of the mid small bowel. The cause of obstruction is not identified. This is most likely due to an adhesion. 4. Stable small enhancing mass in the gallbladder fundus. 5. Progressive patchy interstitial opacities in both lungs. This could be due to infection, drug allergy, or patchy interstitial edema. 6. Stable small right pleural effusion with decreased right lower lobe atelectasis. 7. Little change in 2 right lung nodules. 8. Little change in mediastinal and bilateral hilar adenopathy. 9. Mild aortic atherosclerosis. Electronically Signed   By: Claudie Revering M.D.   On: 06/24/2016 18:30    EKG: NSR CXR: CT shows patchy groundglass, stable to slightly increasing PE.  ASSESSMENT / PLAN:  Pulmonary Emboli: Given current situation, I believe the benefit of anticoagulation outweighs the risk. Agree with argatroban while inpatient (in the case of need for procedures, etc). I do not believe he is having a clinically significant GIB.  Stage IV Lung Cancer: It should be noted that the patient is not getting cytotoxic chemo (pembrolizomab is immunotherapy), and has enjoyed relatively good quality of life prior to SBO and PEs, so would likely benefit from continuing this therapy as an outpatient.  CHF: Agree with holding lasix for now given SBO, but would not give any more fluid boluses.  SBO: Agree with conservative management with NGT placement, and will hopefully spontaneously resolve as had previously. Recommend more aggressive management with soft diet (once SBO resolves) as well as more aggressive bowel regimen with opiates as outpatient.   I have personally obtained a history, examined the patient, evaluated laboratory and imaging results, formulated the assessment and plan and placed orders.  CRITICAL CARE: The  patient is  critically ill with multiple organ systems failure and requires high complexity decision making for assessment and support, frequent evaluation and titration of therapies, application of advanced monitoring technologies and extensive interpretation of multiple databases. Critical Care Time devoted to patient care services described in this note is 60 minutes.   Luz Brazen, MD Pulmonary and [place] Pager: [phone]   06/24/2016, 9:22 PM

## 2016-06-24 NOTE — Progress Notes (Signed)
ANTICOAGULATION CONSULT NOTE - Initial Consult  Pharmacy Consult for Argatroban Indication: pulmonary embolus  Allergies  Allergen Reactions  . Tramadol Palpitations    "Made my chest feel tight"  . Amoxicillin     REACTION: rash  . Penicillins Rash    Has patient had a PCN reaction causing immediate rash, facial/tongue/throat swelling, SOB or lightheadedness with hypotension: unknown Has patient had a PCN reaction causing severe rash involving mucus membranes or skin necrosis: unknown Has patient had a PCN reaction that required hospitalization: no  Has patient had a PCN reaction occurring within the last 10 years: no  If all of the above answers are "NO", then may proceed with Cephalosporin use.     Patient Measurements: Height: 5\' 9"  (175.3 cm) Weight: 190 lb (86.2 kg) IBW/kg (Calculated) : 70.7  Vital Signs: Temp: 97.9 F (36.6 C) (12/15 1936) Temp Source: Oral (12/15 1936) BP: 109/70 (12/15 2000) Pulse Rate: 100 (12/15 2148)  Labs:  Recent Labs  06/22/16 0419 06/22/16 0520 06/24/16 1622 06/24/16 2151  HGB 7.8*  --  9.2*  --   HCT 22.5*  --  25.3*  --   PLT 70*  --  95*  --   APTT  --  75*  --  26  LABPROT  --   --   --  14.7  INR  --   --   --  1.14  CREATININE  --   --  0.86  --   TROPONINI  --   --  <0.03  --     Estimated Creatinine Clearance: 80.7 mL/min (by C-G formula based on SCr of 0.86 mg/dL).   Medical History: Past Medical History:  Diagnosis Date  . Adenocarcinoma of right lung, stage 4 (HCC) 05/27/2016  . Adenomatous polyp of colon 1996  . Arthritis    Osteoarthritis right knee, spine, wrist  . Arthritis of hand, right    Right thumb (Dr. 05/29/2016)  . Back ache    r/o Multiple Myeloma  . Benign prostatic hypertrophy    (Dr. Teressa Senter in past)  . Degenerative disc disease   . Diverticulosis   . DVT (deep venous thrombosis) (HCC) 01/2014   R calf  . Encounter for antineoplastic chemotherapy 10/20/2015  . Erosive esophagitis   . GERD  (gastroesophageal reflux disease)   . Hemorrhoids   . Hiatal hernia   . History of chronic prostatitis   . History of melanoma    Back (Dr. 12/20/2015); early melanoma R arm 03/2011  . Multiple myeloma (HCC) 2013   skull lytic lesions--treated with radiation 07/2014  . Pulmonary embolism (HCC) 01/2014  . Radiation 07/21/14-08/05/14   left occipital parietal skull 25 gray  . SCC (squamous cell carcinoma) 08/04/15   right ear;dysplastic nevus with severe atypia-chest    Assessment: 89 yoM admitted on 12/15 with PE and pharmacy is consulted to dose argatroban IV.  PMH is significant for metastatic lung cancer and chronic anticoagulation for history of PE/DVT.  Recent admission 11/26-12/4 for SBO.  During that admission, Xarelto was held on 11/26 for NPO status and in case of surgical intervention.  He was started on IV Heparin on 11/28, but during the course of heparin, his Hgb trended down and Pltc trended down (baseline Pltc 120-170's, Pltc 68 on discharge). Heparin infusion was active from 11/28-12/1, but was held for low platelets on 12/1. No bleeding or complications were noted.  HIT panel not ordered at that time.  Xarelto was resumed on 12/3 and  he was discharged on 12/4. Patient was re-admitted on 12/10 and was placed on Argatroban drip for PE until HIT ruled out. Heparin antibody came back elevated, however SRA negative. Patient transitioned to Apixaban per Oncology recommendations and discharged on 12/13.   Patient now presents with abdominal pain. CT scan shows recurrent SBO and mildly progressive RLL PE.   Last dose of Apixaban '10mg'$  PO PTA was this morning at 0800. FOBT +.  CBC shows Hgb 9.2 and improving Pltc of 95K. PT/INR, aPTT, and LFTs WNL.  Goal of Therapy:  aPTT 50-70 seconds (per Dr. Blaine Hamper, would like to target lower end of goal range secondary to low Hgb, FOBT + on admission) Monitor platelets by anticoagulation protocol: Yes   Plan:   Argatroban IV at 0.7 mcg/kg/min per  previous dosing history (using slightly lower starting rate than prior admission, as now targeting lower end of goal range).   Check aPTT 2 hours after initiation, then every 2 hours until two therapeutic levels obtained, then daily thereafter.   CBC at least daily while on argatroban (currently ordered q6h per MD).   Monitor closely for s/sx of bleeding.    Lindell Spar, PharmD, BCPS Pager: [phone] 06/24/2016 10:39 PM

## 2016-06-24 NOTE — Telephone Encounter (Signed)
Pt called c/o nausea since D/C from hospital Wednesday 12/13.  Pt started on eliquis for DVT/PE.  Pt stated he is taking zofran and compazine without results.  Per Dr. [name] pt can take ativan for nausea.  Pt has old RX at home for ativan, but will run out.  Refill called into pharmacy.  Pt also stated he is still have SOB since d/c.  Per pt, SOB has not gotten worse.  Instructed pt to come to ED if symptoms worsen.  Pt verbalized understanding.    Per Tammi, pt called a couple hours later c/o worsening SOB.  Pt instructed to go to emergency room.  Pt verbalized understanding.

## 2016-06-24 NOTE — Consult Note (Signed)
Reason for Consult:SBO Referring Physician: Blaine Hamper MD   [name] is an 75 y.o. male.  HPI: Asked to see patient at the request of Dr.NIU for recurrent partial small bowel obstruction. Patient is stage IV lung cancer and has been admitted to the hospital the last month multiple times for recurrent pulmonary embolus, shortness of breath and now abdominal distention and discomfort for 1 day. He is passing gas but has not had a regular bowel movement for last 2-3 days. He was seen here about 2 weeks ago for a partial small bowel obstruction which resolved. This is recurred. Repeat CT scan shows recurrent small bowel obstruction. No history of abdominal surgery. No evidence of hernia. He is undergoing chemotherapy for metastatic lung cancer. He is a history of multiple myeloma which is in remission.  Past Medical History:  Diagnosis Date  . Adenocarcinoma of right lung, stage 4 ([place]) 05/27/2016  . Adenomatous polyp of colon 1996  . Arthritis    Osteoarthritis right knee, spine, wrist  . Arthritis of hand, right    Right thumb (Dr. Daylene Katayama)  . Back ache    r/o Multiple Myeloma  . Benign prostatic hypertrophy    (Dr. Karsten Ro in past)  . Degenerative disc disease   . Diverticulosis   . DVT (deep venous thrombosis) ([place]) 01/2014   R calf  . Encounter for antineoplastic chemotherapy 10/20/2015  . Erosive esophagitis   . GERD (gastroesophageal reflux disease)   . Hemorrhoids   . Hiatal hernia   . History of chronic prostatitis   . History of melanoma    Back (Dr. Wilhemina Bonito); early melanoma R arm 03/2011  . Multiple myeloma ([place]) 2013   skull lytic lesions--treated with radiation 07/2014  . Pulmonary embolism ([place]) 01/2014  . Radiation 07/21/14-08/05/14   left occipital parietal skull 25 gray  . SCC (squamous cell carcinoma) 08/04/15   right ear;dysplastic nevus with severe atypia-chest    Past Surgical History:  Procedure Laterality Date  . APPENDECTOMY    . BONE MARROW TRANSPLANT  03/2013    Stem Cell  . COLONOSCOPY  2009   adenomatous polyp  . ESOPHAGOGASTRODUODENOSCOPY  2009   mild inflammation at esophagogastric junction  . excision of melanoma     back ( x3)  . Great toe surgery     bilateral  . INGUINAL HERNIA REPAIR     left, x 2  . port placemet  10/2012  . SCALENE NODE BIOPSY Right 05/11/2016   Procedure: BIOPSY RIGHT SCALENE NODE;  Surgeon: Grace Isaac, MD;  Location: [facility];  Service: Thoracic;  Laterality: Right;  . TOTAL KNEE ARTHROPLASTY  06/08/2011   Procedure: TOTAL KNEE ARTHROPLASTY;  Surgeon: Ninetta Lights, MD;  Location: [facility];  Service: Orthopedics;  Laterality: Right;  osteonics  . total knee replaced  2000   Left knee    Family History  Problem Relation Age of Onset  . Dementia Mother   . Stroke Father     brainstem stroke  . Hypertension Father   . Diabetes Father   . Prostate cancer Maternal Grandfather   . Depression Daughter   . Anesthesia problems Neg Hx     Social History:  reports that he quit smoking about 39 years ago. His smoking use included Cigarettes. He has a 30.00 pack-year smoking history. He has never used smokeless tobacco. He reports that he drinks about 1.5 oz of alcohol per week . He reports that he does not use drugs.  Allergies:  Allergies  Allergen Reactions  . Tramadol Palpitations    "Made my chest feel tight"  . Amoxicillin     REACTION: rash  . Penicillins Rash    Has patient had a PCN reaction causing immediate rash, facial/tongue/throat swelling, SOB or lightheadedness with hypotension: unknown Has patient had a PCN reaction causing severe rash involving mucus membranes or skin necrosis: unknown Has patient had a PCN reaction that required hospitalization: no  Has patient had a PCN reaction occurring within the last 10 years: no  If all of the above answers are "NO", then may proceed with Cephalosporin use.     Medications: I have reviewed the patient's current medications.  Results for orders  placed or performed during the hospital encounter of 06/24/16 (from the past 48 hour(s))  POC occult blood, ED Provider will collect     Status: Abnormal   Collection Time: 06/24/16  4:08 PM  Result Value Ref Range   Fecal Occult Bld POSITIVE (A) NEGATIVE  Urinalysis, Routine w reflex microscopic     Status: None   Collection Time: 06/24/16  4:11 PM  Result Value Ref Range   Color, Urine YELLOW YELLOW   APPearance CLEAR CLEAR   Specific Gravity, Urine 1.008 1.005 - 1.030   pH 6.0 5.0 - 8.0   Glucose, UA NEGATIVE NEGATIVE mg/dL   Hgb urine dipstick NEGATIVE NEGATIVE   Bilirubin Urine NEGATIVE NEGATIVE   Ketones, ur NEGATIVE NEGATIVE mg/dL   Protein, ur NEGATIVE NEGATIVE mg/dL   Nitrite NEGATIVE NEGATIVE   Leukocytes, UA NEGATIVE NEGATIVE  CBC with Differential     Status: Abnormal   Collection Time: 06/24/16  4:22 PM  Result Value Ref Range   WBC 5.6 4.0 - 10.5 K/uL   RBC 2.80 (L) 4.22 - 5.81 MIL/uL   Hemoglobin 9.2 (L) 13.0 - 17.0 g/dL   HCT 25.3 (L) 39.0 - 52.0 %   MCV 90.4 78.0 - 100.0 fL   MCH 32.9 26.0 - 34.0 pg   MCHC 36.4 (H) 30.0 - 36.0 g/dL   RDW 16.4 (H) 11.5 - 15.5 %   Platelets 95 (L) 150 - 400 K/uL    Comment: CONSISTENT WITH PREVIOUS RESULT   Neutrophils Relative % 89 %   Neutro Abs 5.0 1.7 - 7.7 K/uL   Lymphocytes Relative 8 %   Lymphs Abs 0.5 (L) 0.7 - 4.0 K/uL   Monocytes Relative 3 %   Monocytes Absolute 0.1 0.1 - 1.0 K/uL   Eosinophils Relative 0 %   Eosinophils Absolute 0.0 0.0 - 0.7 K/uL   Basophils Relative 0 %   Basophils Absolute 0.0 0.0 - 0.1 K/uL  Comprehensive metabolic panel     Status: Abnormal   Collection Time: 06/24/16  4:22 PM  Result Value Ref Range   Sodium 126 (L) 135 - 145 mmol/L   Potassium 3.3 (L) 3.5 - 5.1 mmol/L   Chloride 91 (L) 101 - 111 mmol/L   CO2 26 22 - 32 mmol/L   Glucose, Bld 96 65 - 99 mg/dL   BUN 11 6 - 20 mg/dL   Creatinine, Ser 0.86 0.61 - 1.24 mg/dL   Calcium 8.5 (L) 8.9 - 10.3 mg/dL   Total Protein 5.2 (L) 6.5  - 8.1 g/dL   Albumin 2.5 (L) 3.5 - 5.0 g/dL   AST 18 15 - 41 U/L   ALT 24 17 - 63 U/L   Alkaline Phosphatase 70 38 - 126 U/L   Total Bilirubin 0.6 0.3 -  1.2 mg/dL   GFR calc non Af Amer >60 >60 mL/min   GFR calc Af Amer >60 >60 mL/min    Comment: (NOTE) The eGFR has been calculated using the CKD EPI equation. This calculation has not been validated in all clinical situations. eGFR's persistently <60 mL/min signify possible Chronic Kidney Disease.    Anion gap 9 5 - 15  Lipase, blood     Status: None   Collection Time: 06/24/16  4:22 PM  Result Value Ref Range   Lipase 21 11 - 51 U/L  Troponin I     Status: None   Collection Time: 06/24/16  4:22 PM  Result Value Ref Range   Troponin I <0.03 <0.03 ng/mL  I-Stat CG4 Lactic Acid, ED     Status: None   Collection Time: 06/24/16  8:12 PM  Result Value Ref Range   Lactic Acid, Venous 0.96 0.5 - 1.9 mmol/L   *Note: Due to a large number of results and/or encounters for the requested time period, some results have not been displayed. A complete set of results can be found in Results Review.    Dg Chest 2 View  Result Date: 06/24/2016 CLINICAL DATA:  Weakness and shortness of Breath EXAM: CHEST  2 VIEW COMPARISON:  06/19/2016 FINDINGS: Cardiac shadow is enlarged. Right chest wall port is again seen. Patchy infiltrates are again noted in the right lung. Improved aeration in the left base is noted. No new focal abnormality is seen. No bony abnormality is noted. IMPRESSION: Improved aeration on the left with stable infiltrates on the right. Electronically Signed   By: Inez Catalina M.D.   On: 06/24/2016 16:38   Ct Head Wo Contrast  Result Date: 06/24/2016 CLINICAL DATA:  Weakness, nausea, shortness of breath, diagnosed with pulmonary embolism on 06/19/2016, headache, history melanoma, multiple myeloma, lung cancer EXAM: CT HEAD WITHOUT CONTRAST TECHNIQUE: Contiguous axial images were obtained from the base of the skull through the vertex  without intravenous contrast. COMPARISON:  06/27/2016 FINDINGS: Brain: Generalized atrophy. Normal ventricular morphology. No midline shift or mass effect. Small vessel chronic ischemic changes of deep cerebral white matter. Previously identified metastases within the RIGHT cerebellum and RIGHT occipital lobe on prior contrast-enhanced CT and prior MR brain not identified by this noncontrast exam. No intracranial hemorrhage, visualized mass lesion, evidence acute infarction, or extra-axial fluid collection. Vascular: Unremarkable Skull: Intact Sinuses/Orbits: Probable small osteoma of the LEFT frontal sinus. Remaining paranasal sinuses and mastoid air cells clear. Other: N/A IMPRESSION: Atrophy with small vessel chronic ischemic changes of deep cerebral white matter. No acute intracranial abnormalities. Enhancing lesions within the RIGHT cerebellum and RIGHT occipital lobe consistent with metastases as identified on the previous exam are not identified by the current noncontrast study. Electronically Signed   By: Lavonia Dana M.D.   On: 06/24/2016 17:54   Ct Angio Chest Pe W And/or Wo Contrast  Result Date: 06/24/2016 CLINICAL DATA:  Shortness of breath. Recently diagnosed with pulmonary embolism. Taking Eliquis. Right chest pain. Stage IV metastatic small cell lung cancer. Multiple myeloma. EXAM: CT ANGIOGRAPHY CHEST CT ABDOMEN AND PELVIS WITH CONTRAST TECHNIQUE: Multidetector CT imaging of the chest was performed using the standard protocol during bolus administration of intravenous contrast. Multiplanar CT image reconstructions and MIPs were obtained to evaluate the vascular anatomy. Multidetector CT imaging of the abdomen and pelvis was performed using the standard protocol during bolus administration of intravenous contrast. CONTRAST:  100 cc Isovue 370 COMPARISON:  Chest CTA dated 06/19/2016 and abdomen  and pelvis CT dated 06/10/2016. FINDINGS: CTA CHEST FINDINGS Cardiovascular: Mildly progressive  subsegmental pulmonary embolus in the right lower lobe. The remainder of the previously demonstrated segmental and subsegmental right lower lobe and right upper lobe pulmonary emboli have not changed significantly. No definite left lower lobe pulmonary emboli are seen today. Mediastinum/Nodes: The previously demonstrated hypodense left lobe thyroid nodule is not clearly visible today. The previously demonstrated 1.5 cm short axis right peritracheal node has a short axis diameter of 1.5 cm on image number 30 of series 4. The previously demonstrated 1.2 cm short axis subcarinal node right has a short axis diameter of 1.4 cm on image number 38 of series 4. The previously demonstrated 1.7 cm short axis diameter right hilar node has a short axis diameter of 1.8 cm on image number 38 of series 4. A previously demonstrated 1.2 cm short axis left hilar node has a short axis diameter of 1.3 cm on image number 40 of series 4. Lungs/Pleura: No significant change in a small right pleural effusion. The previously demonstrated 1.5 x 1.2 cm irregular central right upper lobe nodule 1 measures 1.4 x 1.0 cm on image number 33 of series 12. The previously demonstrated 3 mm right middle lobe nodule measures 6 mm on image number 38 of series 12. Mildly progressive consolidated right middle lobe atelectasis. Progressive patchy interstitial opacities in both upper lobes. Mild right lower lobe atelectasis with improvement. Musculoskeletal: Thoracic spine degenerative changes. Review of the MIP images confirms the above findings. CT ABDOMEN and PELVIS FINDINGS Hepatobiliary: No focal liver abnormality is seen. No gallstones, gallbladder wall thickening, or biliary dilatation. A small gallbladder fundal nodule with enhancement is again demonstrated. Pancreas: Diffusely atrophied. Spleen: Normal in size without focal abnormality. Adrenals/Urinary Tract: Stable left renal cortical scar. Normal appearing adrenal glands and right kidney.  Distended urinary bladder with mildly dilated ureters. No hydronephrosis. Stomach/Bowel: Interval multiple dilated proximal small bowel loops with normal caliber distal small bowel loops. Unremarkable stomach and colon. No evidence of appendicitis. Vascular/Lymphatic: Mild atheromatous arterial calcifications, including the abdominal aorta. No enlarged lymph nodes. Reproductive: Minimally enlarged prostate gland. Other: Tiny amount of free peritoneal fluid in the posterior pelvis on the right. Musculoskeletal: Lumbar spine degenerative changes. Review of the MIP images confirms the above findings. IMPRESSION: 1. Mildly progressive right lower lobe pulmonary emboli. 2. Stable central right upper lobe pulmonary emboli. 3. Interval obstruction of the mid small bowel. The cause of obstruction is not identified. This is most likely due to an adhesion. 4. Stable small enhancing mass in the gallbladder fundus. 5. Progressive patchy interstitial opacities in both lungs. This could be due to infection, drug allergy, or patchy interstitial edema. 6. Stable small right pleural effusion with decreased right lower lobe atelectasis. 7. Little change in 2 right lung nodules. 8. Little change in mediastinal and bilateral hilar adenopathy. 9. Mild aortic atherosclerosis. Electronically Signed   By: Claudie Revering M.D.   On: 06/24/2016 18:30   Ct Abdomen Pelvis W Contrast  Result Date: 06/24/2016 CLINICAL DATA:  Shortness of breath. Recently diagnosed with pulmonary embolism. Taking Eliquis. Right chest pain. Stage IV metastatic small cell lung cancer. Multiple myeloma. EXAM: CT ANGIOGRAPHY CHEST CT ABDOMEN AND PELVIS WITH CONTRAST TECHNIQUE: Multidetector CT imaging of the chest was performed using the standard protocol during bolus administration of intravenous contrast. Multiplanar CT image reconstructions and MIPs were obtained to evaluate the vascular anatomy. Multidetector CT imaging of the abdomen and pelvis was performed  using the standard  protocol during bolus administration of intravenous contrast. CONTRAST:  100 cc Isovue 370 COMPARISON:  Chest CTA dated 06/19/2016 and abdomen and pelvis CT dated 06/10/2016. FINDINGS: CTA CHEST FINDINGS Cardiovascular: Mildly progressive subsegmental pulmonary embolus in the right lower lobe. The remainder of the previously demonstrated segmental and subsegmental right lower lobe and right upper lobe pulmonary emboli have not changed significantly. No definite left lower lobe pulmonary emboli are seen today. Mediastinum/Nodes: The previously demonstrated hypodense left lobe thyroid nodule is not clearly visible today. The previously demonstrated 1.5 cm short axis right peritracheal node has a short axis diameter of 1.5 cm on image number 30 of series 4. The previously demonstrated 1.2 cm short axis subcarinal node right has a short axis diameter of 1.4 cm on image number 38 of series 4. The previously demonstrated 1.7 cm short axis diameter right hilar node has a short axis diameter of 1.8 cm on image number 38 of series 4. A previously demonstrated 1.2 cm short axis left hilar node has a short axis diameter of 1.3 cm on image number 40 of series 4. Lungs/Pleura: No significant change in a small right pleural effusion. The previously demonstrated 1.5 x 1.2 cm irregular central right upper lobe nodule 1 measures 1.4 x 1.0 cm on image number 33 of series 12. The previously demonstrated 3 mm right middle lobe nodule measures 6 mm on image number 38 of series 12. Mildly progressive consolidated right middle lobe atelectasis. Progressive patchy interstitial opacities in both upper lobes. Mild right lower lobe atelectasis with improvement. Musculoskeletal: Thoracic spine degenerative changes. Review of the MIP images confirms the above findings. CT ABDOMEN and PELVIS FINDINGS Hepatobiliary: No focal liver abnormality is seen. No gallstones, gallbladder wall thickening, or biliary dilatation. A small  gallbladder fundal nodule with enhancement is again demonstrated. Pancreas: Diffusely atrophied. Spleen: Normal in size without focal abnormality. Adrenals/Urinary Tract: Stable left renal cortical scar. Normal appearing adrenal glands and right kidney. Distended urinary bladder with mildly dilated ureters. No hydronephrosis. Stomach/Bowel: Interval multiple dilated proximal small bowel loops with normal caliber distal small bowel loops. Unremarkable stomach and colon. No evidence of appendicitis. Vascular/Lymphatic: Mild atheromatous arterial calcifications, including the abdominal aorta. No enlarged lymph nodes. Reproductive: Minimally enlarged prostate gland. Other: Tiny amount of free peritoneal fluid in the posterior pelvis on the right. Musculoskeletal: Lumbar spine degenerative changes. Review of the MIP images confirms the above findings. IMPRESSION: 1. Mildly progressive right lower lobe pulmonary emboli. 2. Stable central right upper lobe pulmonary emboli. 3. Interval obstruction of the mid small bowel. The cause of obstruction is not identified. This is most likely due to an adhesion. 4. Stable small enhancing mass in the gallbladder fundus. 5. Progressive patchy interstitial opacities in both lungs. This could be due to infection, drug allergy, or patchy interstitial edema. 6. Stable small right pleural effusion with decreased right lower lobe atelectasis. 7. Little change in 2 right lung nodules. 8. Little change in mediastinal and bilateral hilar adenopathy. 9. Mild aortic atherosclerosis. Electronically Signed   By: Claudie Revering M.D.   On: 06/24/2016 18:30    Review of Systems  Constitutional: Positive for malaise/fatigue. Negative for chills.  Eyes: Negative for blurred vision.  Respiratory: Positive for cough and shortness of breath.   Gastrointestinal: Positive for abdominal pain, diarrhea, nausea and vomiting.  Genitourinary: Negative for dysuria and urgency.  Musculoskeletal: Negative  for myalgias and neck pain.  Skin: Negative for itching and rash.  Neurological: Positive for weakness and headaches.  Endo/Heme/Allergies:  Bruises/bleeds easily.   Blood pressure 109/70, pulse 100, temperature 97.9 F (36.6 C), temperature source Oral, resp. rate 14, height '5\' 9"'$  (1.753 m), weight 86.2 kg (190 lb), SpO2 97 %. Physical Exam  Constitutional: He is oriented to person, place, and time. He has a sickly appearance.  HENT:  Head: Normocephalic and atraumatic.  Eyes: Pupils are equal, round, and reactive to light. No scleral icterus.  Neck: Normal range of motion. Neck supple.  Cardiovascular: Normal rate.   Respiratory: Effort normal. No respiratory distress. He has no wheezes.  GI: Soft. He exhibits distension. He exhibits no mass. There is no tenderness. There is no rebound and no guarding.  Musculoskeletal: Normal range of motion.  Neurological: He is alert and oriented to person, place, and time.  Skin: Skin is warm and dry.    Assessment/Plan: Recurrent partial small  bowel obstruction- concern for carcinomatosis as cause given no previous abdominal surgery. Managed with IV fluids and NG tube decompression. No evidence of peritonitis. Repeat films in the morning. Repeat labs in the morning. Continue fluid resuscitation. Poor operative candidate  Patient Active Problem List   Diagnosis Date Noted  . GERD (gastroesophageal reflux disease) 06/24/2016  . Chronic diastolic CHF (congestive heart failure) ([place]) 06/24/2016  . GIB (gastrointestinal bleeding) 06/24/2016  . Malnutrition of moderate degree 06/20/2016  . Malignant neoplasm of upper lobe of right lung ([place])   . Pulmonary emboli ([place]) 06/19/2016  . Pulmonary embolus ([place]) 06/19/2016  . Abnormal CT scan, small bowel   . Encephalopathy, metabolic   . Thrombocytopenia ([place])   . Diabetes insipidus secondary to vasopressin deficiency ([place])   . Hypernatremia   . SBO (small bowel obstruction) 06/06/2016  .  Hypokalemia 06/06/2016  . Adenocarcinoma of right lung, stage 4 ([place]) 05/27/2016  . Nausea with vomiting 05/18/2016  . Dehydration 05/18/2016  . Brain metastases ([place]) 05/04/2016  . Mediastinal adenopathy 05/04/2016  . Peripheral neuropathy due to chemotherapy ([place]) 02/08/2016  . Long term current use of anticoagulant therapy 11/04/2014  . Peripheral edema 11/04/2014  . Knee pain, bilateral 11/04/2014  . Headache 07/14/2014  . Abdominal aortic atherosclerosis ([place]) 04/28/2014  . History of pulmonary embolism 02/07/2014  . Acute deep vein thrombosis (DVT) of tibial vein of right lower extremity ([place]) 02/07/2014  . DVT of leg (deep venous thrombosis) ([place]) 02/06/2014  . Multiple myeloma ([place]) 01/27/2012  . Hyponatremia 01/16/2012  . Anemia 01/16/2012  . Dyspnea 04/18/2011  . Preop cardiovascular exam 04/18/2011  . ABDOMINAL PAIN-RUQ 04/29/2010  . IRRITABLE BOWEL SYNDROME 02/19/2009  . GERD 01/03/2008  . PERSONAL HX COLONIC POLYPS 01/03/2008    Kallista Pae A. 06/24/2016, 9:56 PM

## 2016-06-24 NOTE — ED Notes (Signed)
Patient transported to X-ray 

## 2016-06-24 NOTE — ED Provider Notes (Signed)
[facility] DEPT Provider Note   CSN: [id number] Arrival date & time: 06/24/16  1452     History   Chief Complaint Chief Complaint  Patient presents with  . Shortness of Breath  . Nausea    HPI [name] is a 75 y.o. male with a past medical history of metastatic adenocarcinoma of the right lung with metastases to the brain, pituitary gland and mediastinal lymph nodes. He also has a past medical history of melanoma and adenomatous polyps of the colon. The patient presents today with chief complaint of severe headache, nausea, abdominal pain and worsening shortness of breath. The patient was admitted earlier this month for small bowel obstruction. He was taken off of his Xarelto for previous PEs and DVTs and placed on heparin. Patient returned with severe shortness of breath and was diagnosed with multiple pulmonary emboli. Review of chart shows that the patient did develop an antibody to heparin and diagnosis is suggestive of HIT. The patient was transitioned to Eliquis and  since had severe uncontrolled nausea. Has been persistent since he left the hospital 2 days ago. The patient states that he had one bowel movement 3 days ago, which was watery brown stool. Yesterday he had a very small watery black stool. The patient also states that yesterday he developed a severe headache. He does have metastases to his brain which have been treated with radiation. He is on Decadron daily. The patient has had worsening shortness of breath since last night. His wife states that this morning he was too weak to get off the couch. This is worse than his previous constant pleuritic chest pain and shortness of breath. Review of the EMR shows his last echocardiogram on the 11th with right heart strain. Patient states that yesterday he developed a sudden onset severe headache. He has been taking by mouth oxycodone, Decadron, Ativan, Zofran and Compazine with out any relief of his symptoms. He has abdominal  distention and pain. He has not been vomiting. He states that his abdominal pain feels the same as his previous small bowel obstruction.  HPI  Past Medical History:  Diagnosis Date  . Adenocarcinoma of right lung, stage 4 ([place]) 05/27/2016  . Adenomatous polyp of colon 1996  . Arthritis    Osteoarthritis right knee, spine, wrist  . Arthritis of hand, right    Right thumb (Dr. Daylene Katayama)  . Back ache    r/o Multiple Myeloma  . Benign prostatic hypertrophy    (Dr. Karsten Ro in past)  . Degenerative disc disease   . Diverticulosis   . DVT (deep venous thrombosis) ([place]) 01/2014   R calf  . Encounter for antineoplastic chemotherapy 10/20/2015  . Erosive esophagitis   . GERD (gastroesophageal reflux disease)   . Hemorrhoids   . Hiatal hernia   . History of chronic prostatitis   . History of melanoma    Back (Dr. Wilhemina Bonito); early melanoma R arm 03/2011  . Multiple myeloma ([place]) 2013   skull lytic lesions--treated with radiation 07/2014  . Pulmonary embolism ([place]) 01/2014  . Radiation 07/21/14-08/05/14   left occipital parietal skull 25 gray  . SCC (squamous cell carcinoma) 08/04/15   right ear;dysplastic nevus with severe atypia-chest    Patient Active Problem List   Diagnosis Date Noted  . Malnutrition of moderate degree 06/20/2016  . Malignant neoplasm of upper lobe of right lung ([place])   . Pulmonary emboli ([place]) 06/19/2016  . Pulmonary embolus ([place]) 06/19/2016  . Abnormal CT scan, small bowel   .  Encephalopathy, metabolic   . Thrombocytopenia ([place])   . Diabetes insipidus secondary to vasopressin deficiency ([place])   . Hypernatremia   . SBO (small bowel obstruction) 06/06/2016  . Hypokalemia 06/06/2016  . Adenocarcinoma of right lung, stage 4 ([place]) 05/27/2016  . Nausea with vomiting 05/18/2016  . Dehydration 05/18/2016  . Brain metastases ([place]) 05/04/2016  . Mediastinal adenopathy 05/04/2016  . Peripheral neuropathy due to chemotherapy ([place]) 02/08/2016  . Long term current use of  anticoagulant therapy 11/04/2014  . Peripheral edema 11/04/2014  . Knee pain, bilateral 11/04/2014  . Headache 07/14/2014  . Abdominal aortic atherosclerosis ([place]) 04/28/2014  . History of pulmonary embolism 02/07/2014  . Acute deep vein thrombosis (DVT) of tibial vein of right lower extremity ([place]) 02/07/2014  . DVT of leg (deep venous thrombosis) ([place]) 02/06/2014  . Multiple myeloma ([place]) 01/27/2012  . Hyponatremia 01/16/2012  . Anemia 01/16/2012  . Dyspnea 04/18/2011  . Preop cardiovascular exam 04/18/2011  . ABDOMINAL PAIN-RUQ 04/29/2010  . IRRITABLE BOWEL SYNDROME 02/19/2009  . GERD 01/03/2008  . PERSONAL HX COLONIC POLYPS 01/03/2008    Past Surgical History:  Procedure Laterality Date  . APPENDECTOMY    . BONE MARROW TRANSPLANT  03/2013   Stem Cell  . COLONOSCOPY  2009   adenomatous polyp  . ESOPHAGOGASTRODUODENOSCOPY  2009   mild inflammation at esophagogastric junction  . excision of melanoma     back ( x3)  . Great toe surgery     bilateral  . INGUINAL HERNIA REPAIR     left, x 2  . port placemet  10/2012  . SCALENE NODE BIOPSY Right 05/11/2016   Procedure: BIOPSY RIGHT SCALENE NODE;  Surgeon: Grace Isaac, MD;  Location: [facility];  Service: Thoracic;  Laterality: Right;  . TOTAL KNEE ARTHROPLASTY  06/08/2011   Procedure: TOTAL KNEE ARTHROPLASTY;  Surgeon: Ninetta Lights, MD;  Location: [facility];  Service: Orthopedics;  Laterality: Right;  osteonics  . total knee replaced  2000   Left knee       Home Medications    Prior to Admission medications   Medication Sig Start Date End Date Taking? Authorizing Provider  acetaminophen (TYLENOL) 500 MG tablet Take 500 mg by mouth every 6 (six) hours as needed for moderate pain or headache.    Historical Provider, MD  apixaban (ELIQUIS) 5 MG TABS tablet Take 2 tabs ('10mg'$ ) po BID for 7 days, then take 1 tab ('5mg'$ ) po BID 06/22/16   Patrecia Pour, MD  ascorbic acid (VITAMIN C) 500 MG tablet Take 500 mg by mouth 2 (two) times  daily.     Historical Provider, MD  cholecalciferol (VITAMIN D) 1000 UNITS tablet Take 1,000 Units by mouth daily.    Historical Provider, MD  desmopressin (DDAVP) 0.1 MG tablet Take 1 tablet (0.1 mg total) by mouth at bedtime. 04/25/16   Rita Ohara, MD  dexamethasone (DECADRON) 4 MG tablet Take 1 tablet (4 mg total) by mouth 2 (two) times daily. Patient taking differently: Take 2 mg by mouth daily.  06/13/16   Barton Dubois, MD  fish oil-omega-3 fatty acids 1000 MG capsule Take 1 g by mouth daily.     Historical Provider, MD  furosemide (LASIX) 20 MG tablet Take 1 tablet (20 mg total) by mouth daily. Patient taking differently: Take 20 mg by mouth daily as needed for fluid.  02/29/16   Rita Ohara, MD  levofloxacin (LEVAQUIN) 500 MG tablet Take 1 tablet (500 mg total) by mouth daily. 06/23/16  06/29/16  Patrecia Pour, MD  LORazepam (ATIVAN) 0.5 MG tablet Take 1 tab PO Q 6 hours PRN nausea or anxiety 06/24/16   Curt Bears, MD  methocarbamol (ROBAXIN) 500 MG tablet TAKE 1 TO 2 TABLETS BY MOUTH EVERY 6 HOURS AS NEEDED FOR MUSCLE SPASMS 12/09/15   Rita Ohara, MD  Multiple Vitamin (MULTIVITAMIN WITH MINERALS) TABS Take 1 tablet by mouth daily.    Historical Provider, MD  ondansetron (ZOFRAN) 8 MG tablet Take 1 tablet (8 mg total) by mouth every 8 (eight) hours as needed for nausea or vomiting. 05/18/16   Susanne Borders, NP  oxyCODONE (ROXICODONE) 5 MG immediate release tablet Take 1 tablet (5 mg total) by mouth every 6 (six) hours as needed for severe pain. 06/22/16   Patrecia Pour, MD  pantoprazole (PROTONIX) 40 MG tablet TAKE 1 TABLET BY MOUTH TWO  TIMES DAILY 05/21/16   Curt Bears, MD  polyethylene glycol [facility]) packet Take 17 g by mouth daily as needed for mild constipation. 06/13/16   Barton Dubois, MD  Probiotic Product (ALIGN) 4 MG CAPS Take 1 capsule by mouth daily. Reported on 09/10/2015    Historical Provider, MD  prochlorperazine (COMPAZINE) 10 MG tablet Take 1 tablet (10 mg total) by mouth  every 6 (six) hours as needed. Patient taking differently: Take 10 mg by mouth every 6 (six) hours as needed for nausea or vomiting.  04/29/16   Curt Bears, MD  tamsulosin (FLOMAX) 0.4 MG CAPS capsule Take 2 capsules (0.8 mg total) by mouth daily after supper. 06/13/16   Kathie Rhodes, MD  temazepam (RESTORIL) 15 MG capsule Take 1 capsule (15 mg total) by mouth at bedtime as needed. Patient taking differently: Take 15 mg by mouth at bedtime as needed for sleep.  04/08/16   Curt Bears, MD  zolendronic acid (ZOMETA) 4 MG/5ML injection Inject 4 mg into the vein every 3 (three) months.     Historical Provider, MD    Family History Family History  Problem Relation Age of Onset  . Dementia Mother   . Stroke Father     brainstem stroke  . Hypertension Father   . Diabetes Father   . Prostate cancer Maternal Grandfather   . Depression Daughter   . Anesthesia problems Neg Hx     Social History Social History  Substance Use Topics  . Smoking status: Former Smoker    Packs/day: 3.00    Years: 10.00    Types: Cigarettes    Quit date: 07/11/1976  . Smokeless tobacco: Never Used     Comment: 3 PPD x 6 years  . Alcohol use 1.5 oz/week    3 Standard drinks or equivalent per week     Comment: 2 glasses of wine daily     Allergies   Tramadol; Amoxicillin; and Penicillins   Review of Systems Review of Systems  Ten systems reviewed and are negative for acute change, except as noted in the HPI.   Physical Exam Updated Vital Signs BP 109/73 (BP Location: Right Arm)   Pulse 84   Temp 97.7 F (36.5 C) (Oral)   Resp 15   SpO2 99%   Physical Exam  Constitutional: He appears well-developed and well-nourished. No distress.  HENT:  Head: Normocephalic and atraumatic.  Eyes: Conjunctivae are normal. No scleral icterus.  Neck: Normal range of motion. Neck supple.  Cardiovascular: Normal rate, regular rhythm and normal heart sounds.   Pulmonary/Chest: Effort normal and breath sounds  normal. No respiratory distress.  Mass of the left rib cage noted on examination  Abdominal: Soft. He exhibits distension. There is tenderness.  Quiet abdomen.MINIMAL High-pitched bowel sounds. Exquisitely tender to palpation  Genitourinary:  Genitourinary Comments: Digital Rectal Exam reveals sphincter with good tone. No external hemorrhoids. No masses or fissures. No firm stool ball in the rectal vault. Minimal stool obtained, which is dark in appearance. Fecal occult stool test positive   Musculoskeletal: He exhibits no edema.  Neurological: He is alert.  Skin: Skin is warm and dry. He is not diaphoretic.  Psychiatric: His behavior is normal.  Nursing note and vitals reviewed.    ED Treatments / Results  Labs (all labs ordered are listed, but only abnormal results are displayed) Labs Reviewed  CBC WITH DIFFERENTIAL/PLATELET  COMPREHENSIVE METABOLIC PANEL  LIPASE, BLOOD  URINALYSIS, ROUTINE W REFLEX MICROSCOPIC  TROPONIN I  POC OCCULT BLOOD, ED    EKG  EKG Interpretation None       Radiology No results found.  Procedures Procedures (including critical care time)  Medications Ordered in ED Medications  sodium chloride 0.9 % bolus 1,000 mL (not administered)  HYDROmorphone (DILAUDID) injection 1 mg (not administered)  ondansetron (ZOFRAN) injection 4 mg (not administered)     Initial Impression / Assessment and Plan / ED Course  I have reviewed the triage vital signs and the nursing notes.  Pertinent labs & imaging results that were available during my care of the patient were reviewed by me and considered in my medical decision making (see chart for details).  Clinical Course as of Jun 24 2146  Fri Jun 24, 2016  1653 Ekg not crossing over,. No QT prrolongation.  [AH]  2142 Hyponatremic Patient is on Decadron and could have some adrenal insufficiency. He just had his dose change from 4 mg once daily 2 mg twice a day Sodium: (!) 126 [AH]  2143 Patient's  hemoglobin 9.2 but improved from prior Hemoglobin: (!) 9.2 [AH]  2143 Mild hypokalemia Potassium: (!) 3.3 [AH]  2143 Lipase: 21 [AH]  2144 Troponin is negative Troponin I: <0.03 [AH]  2144 CT shows some worsening of one of the pulmonary emboli and stable pulmonary emboli throughout CT Angio Chest PE W and/or Wo Contrast [AH]  2144 CT abdomen shows recurrent small bowel obstruction. CT ABDOMEN PELVIS W CONTRAST [AH]  2145 CT head shows resolution of the patient's previous metastases. CT HEAD WO CONTRAST [AH]    Clinical Course User Index [AH] Margarita Mail, PA-C    Patient with complicated history and presentation today in the emergency department   . Differential includes small bowel obstruction, worsening pulmonary emboli, bleeding metastases in the brain,adrenal insufficiency. Patient also has a positive occult stool test and potentially his worsening shortness of breath is from a GI bleed. Significant patient in shared visit with Dr. Dayna Barker. Will obtain CT head, chest and abdomen. We'll attempt to control the patient's symptoms with pain medicine and antiemetics. The patient does not have any acute hypoxia.  Patient with small bowel obstruction. His slight worsening of one of his pulmonary embolus. The patient is noted to be hypotensive at some points. However, this is just after administration of his narcotic pain medication. His pain is improved and his nausea is controlled. Patient has positive occult stool test. Patient will be admitted by Dr.niu  To the stepdown unit. I have spoken with Dr. Oletta Darter for consultation by the intensivists. The patient will be followed by surgery as I have discussed with Dr. Brantley Stage. Patient stable throughout  his ED visit. I have answered all questions to the best of my ability.  Final Clinical Impressions(s) / ED Diagnoses   Final diagnoses:  SBO (small bowel obstruction)  Hyponatremia  Peripheral edema  Shortness of breath  Other pulmonary embolism  without acute cor pulmonale, unspecified chronicity (HCC)    New Prescriptions New Prescriptions   No medications on file     Margarita Mail, PA-C 06/24/16 2147    Merrily Pew, MD 06/25/16 1023

## 2016-06-25 ENCOUNTER — Inpatient Hospital Stay (HOSPITAL_COMMUNITY): Payer: Medicare Other

## 2016-06-25 DIAGNOSIS — K56609 Unspecified intestinal obstruction, unspecified as to partial versus complete obstruction: Secondary | ICD-10-CM

## 2016-06-25 DIAGNOSIS — I2699 Other pulmonary embolism without acute cor pulmonale: Secondary | ICD-10-CM

## 2016-06-25 DIAGNOSIS — Z7901 Long term (current) use of anticoagulants: Secondary | ICD-10-CM

## 2016-06-25 LAB — CBC
HCT: 24.3 % — ABNORMAL LOW (ref 39.0–52.0)
HCT: 25.6 % — ABNORMAL LOW (ref 39.0–52.0)
HEMOGLOBIN: 9 g/dL — AB (ref 13.0–17.0)
Hemoglobin: 8.7 g/dL — ABNORMAL LOW (ref 13.0–17.0)
MCH: 33 pg (ref 26.0–34.0)
MCH: 32.5 pg (ref 26.0–34.0)
MCHC: 35.8 g/dL (ref 30.0–36.0)
MCHC: 35.2 g/dL (ref 30.0–36.0)
MCV: 92 fL (ref 78.0–100.0)
MCV: 92.4 fL (ref 78.0–100.0)
PLATELETS: 86 10*3/uL — AB (ref 150–400)
PLATELETS: 90 10*3/uL — AB (ref 150–400)
RBC: 2.64 MIL/uL — ABNORMAL LOW (ref 4.22–5.81)
RBC: 2.77 MIL/uL — AB (ref 4.22–5.81)
RDW: 17.2 % — AB (ref 11.5–15.5)
RDW: 17 % — ABNORMAL HIGH (ref 11.5–15.5)
WBC: 4.3 10*3/uL (ref 4.0–10.5)
WBC: 4.3 10*3/uL (ref 4.0–10.5)

## 2016-06-25 LAB — BASIC METABOLIC PANEL
Anion gap: 10 (ref 5–15)
Anion gap: 6 (ref 5–15)
BUN: 10 mg/dL (ref 6–20)
BUN: 9 mg/dL (ref 6–20)
CHLORIDE: 99 mmol/L — AB (ref 101–111)
CO2: 24 mmol/L (ref 22–32)
CO2: 26 mmol/L (ref 22–32)
CREATININE: 0.9 mg/dL (ref 0.61–1.24)
Calcium: 8.8 mg/dL — ABNORMAL LOW (ref 8.9–10.3)
Calcium: 9 mg/dL (ref 8.9–10.3)
Chloride: 104 mmol/L (ref 101–111)
Creatinine, Ser: 0.94 mg/dL (ref 0.61–1.24)
Glucose, Bld: 140 mg/dL — ABNORMAL HIGH (ref 65–99)
Glucose, Bld: 132 mg/dL — ABNORMAL HIGH (ref 65–99)
Potassium: 4 mmol/L (ref 3.5–5.1)
Potassium: 4 mmol/L (ref 3.5–5.1)
SODIUM: 133 mmol/L — AB (ref 135–145)
SODIUM: 136 mmol/L (ref 135–145)

## 2016-06-25 LAB — APTT
APTT: 55 s — AB (ref 24–36)
aPTT: 72 seconds — ABNORMAL HIGH (ref 24–36)
aPTT: 48 seconds — ABNORMAL HIGH (ref 24–36)
aPTT: 54 seconds — ABNORMAL HIGH (ref 24–36)

## 2016-06-25 LAB — OSMOLALITY: OSMOLALITY: 273 mosm/kg — AB (ref 275–295)

## 2016-06-25 LAB — MAGNESIUM: Magnesium: 1.5 mg/dL — ABNORMAL LOW (ref 1.7–2.4)

## 2016-06-25 LAB — GLUCOSE, CAPILLARY: Glucose-Capillary: 129 mg/dL — ABNORMAL HIGH (ref 65–99)

## 2016-06-25 LAB — TSH: TSH: 2.131 u[IU]/mL (ref 0.350–4.500)

## 2016-06-25 LAB — MRSA PCR SCREENING: MRSA BY PCR: NEGATIVE

## 2016-06-25 LAB — CORTISOL-AM, BLOOD: CORTISOL - AM: 28.9 ug/dL — AB (ref 6.7–22.6)

## 2016-06-25 MED ORDER — MORPHINE SULFATE (PF) 4 MG/ML IV SOLN
3.0000 mg | INTRAVENOUS | Status: DC | PRN
  Administered 2016-06-25 – 2016-07-07 (×18): 3 mg via INTRAVENOUS
  Filled 2016-06-25 (×19): qty 1

## 2016-06-25 MED ORDER — MORPHINE SULFATE (PF) 2 MG/ML IV SOLN
INTRAVENOUS | Status: AC
  Administered 2016-06-25: 2 mg via INTRAMUSCULAR
  Filled 2016-06-25: qty 1

## 2016-06-25 MED ORDER — DEXAMETHASONE SODIUM PHOSPHATE 4 MG/ML IJ SOLN
2.0000 mg | Freq: Two times a day (BID) | INTRAMUSCULAR | Status: DC
  Administered 2016-06-25 – 2016-06-27 (×6): 2 mg via INTRAVENOUS
  Filled 2016-06-25 (×6): qty 1

## 2016-06-25 MED ORDER — DIATRIZOATE MEGLUMINE & SODIUM 66-10 % PO SOLN
90.0000 mL | Freq: Once | ORAL | Status: AC
  Administered 2016-06-25: 90 mL via NASOGASTRIC
  Filled 2016-06-25: qty 90

## 2016-06-25 MED ORDER — HYDROMORPHONE HCL 2 MG/ML IJ SOLN
1.0000 mg | INTRAMUSCULAR | Status: DC | PRN
  Administered 2016-06-25 (×4): 1 mg via INTRAVENOUS
  Filled 2016-06-25 (×4): qty 1

## 2016-06-25 MED ORDER — ORAL CARE MOUTH RINSE
15.0000 mL | Freq: Two times a day (BID) | OROMUCOSAL | Status: DC
  Administered 2016-06-25 – 2016-07-13 (×21): 15 mL via OROMUCOSAL

## 2016-06-25 MED ORDER — MORPHINE SULFATE (PF) 2 MG/ML IV SOLN
INTRAVENOUS | Status: AC
  Administered 2016-06-25: 1 mg via INTRAMUSCULAR
  Filled 2016-06-25: qty 1

## 2016-06-25 MED ORDER — ARGATROBAN 50 MG/50ML IV SOLN
0.6800 ug/kg/min | INTRAVENOUS | Status: DC
  Administered 2016-06-25 (×2): 0.68 ug/kg/min via INTRAVENOUS
  Filled 2016-06-25 (×3): qty 50

## 2016-06-25 NOTE — Progress Notes (Signed)
ANTICOAGULATION CONSULT NOTE - f/u Consult  Pharmacy Consult for Argatroban Indication: pulmonary embolus  Allergies  Allergen Reactions  . Tramadol Palpitations    "Made my chest feel tight"  . Amoxicillin     REACTION: rash  . Penicillins Rash    Has patient had a PCN reaction causing immediate rash, facial/tongue/throat swelling, SOB or lightheadedness with hypotension: unknown Has patient had a PCN reaction causing severe rash involving mucus membranes or skin necrosis: unknown Has patient had a PCN reaction that required hospitalization: no  Has patient had a PCN reaction occurring within the last 10 years: no  If all of the above answers are "NO", then may proceed with Cephalosporin use.     Patient Measurements: Height: 5\' 10"  (177.8 cm) Weight: 216 lb 11.4 oz (98.3 kg) IBW/kg (Calculated) : 73  Vital Signs: Temp: 97.6 F (36.4 C) (12/16 0000) Temp Source: Oral (12/16 0000) BP: 99/66 (12/16 0100) Pulse Rate: 92 (12/16 0100)  Labs:  Recent Labs  06/22/16 0419 06/22/16 0520 06/24/16 1622 06/24/16 2151 06/25/16 0153  HGB 7.8*  --  9.2* 10.2*  --   HCT 22.5*  --  25.3* 28.8*  --   PLT 70*  --  95* 109*  --   APTT  --  75*  --  26 72*  LABPROT  --   --   --  14.7  --   INR  --   --   --  1.14  --   CREATININE  --   --  0.86 0.99  --   TROPONINI  --   --  <0.03  --   --     Estimated Creatinine Clearance: 75.8 mL/min (by C-G formula based on SCr of 0.99 mg/dL).   Medical History: Past Medical History:  Diagnosis Date  . Adenocarcinoma of right lung, stage 4 ([facility]) 05/27/2016  . Adenomatous polyp of colon 1996  . Arthritis    Osteoarthritis right knee, spine, wrist  . Arthritis of hand, right    Right thumb (Dr. 05/29/2016)  . Back ache    r/o Multiple Myeloma  . Benign prostatic hypertrophy    (Dr. Teressa Senter in past)  . Degenerative disc disease   . Diverticulosis   . DVT (deep venous thrombosis) ([facility]) 01/2014   R calf  . Encounter for antineoplastic  chemotherapy 10/20/2015  . Erosive esophagitis   . GERD (gastroesophageal reflux disease)   . Hemorrhoids   . Hiatal hernia   . History of chronic prostatitis   . History of melanoma    Back (Dr. 12/20/2015); early melanoma R arm 03/2011  . Multiple myeloma ([facility]) 2013   skull lytic lesions--treated with radiation 07/2014  . Pulmonary embolism ([facility]) 01/2014  . Radiation 07/21/14-08/05/14   left occipital parietal skull 25 gray  . SCC (squamous cell carcinoma) 08/04/15   right ear;dysplastic nevus with severe atypia-chest    Assessment: 31 yoM admitted on 12/15 with PE and pharmacy is consulted to dose argatroban IV.  PMH is significant for metastatic lung cancer and chronic anticoagulation for history of PE/DVT.  Recent admission 11/26-12/4 for SBO.  During that admission, Xarelto was held on 11/26 for NPO status and in case of surgical intervention.  He was started on IV Heparin on 11/28, but during the course of heparin, his Hgb trended down and Pltc trended down (baseline Pltc 120-170's, Pltc 68 on discharge). Heparin infusion was active from 11/28-12/1, but was held for low platelets on 12/1. No bleeding or  complications were noted.  HIT panel not ordered at that time.  Xarelto was resumed on 12/3 and he was discharged on 12/4. Patient was re-admitted on 12/10 and was placed on Argatroban drip for PE until HIT ruled out. Heparin antibody came back elevated, however SRA negative. Patient transitioned to Apixaban per Oncology recommendations and discharged on 12/13.   Patient now presents with abdominal pain. CT scan shows recurrent SBO and mildly progressive RLL PE.   Last dose of Apixaban '10mg'$  PO PTA was this morning at 0800. FOBT +.  CBC shows Hgb 9.2 and improving Pltc of 95K. PT/INR, aPTT, and LFTs WNL.  Today, 12/16 0153 aPtt = 72 sec- just slighlty above goal range, no infusion or bleeding issues per RN. RN does report petechia on legs- but had PTA.   Goal of Therapy:  aPTT 50-70  seconds (per Dr. Blaine Hamper, would like to target lower end of goal range secondary to low Hgb, FOBT + on admission) Monitor platelets by anticoagulation protocol: Yes   Plan:   Continue Argatroban IV at 0.7 mcg/kg/min ( targeting lower end of goal range).   Check aPTT 2 hours after initiation, then every 2 hours until two therapeutic levels obtained, then daily thereafter.   CBC at least daily while on argatroban (currently ordered q6h per MD).   Monitor closely for s/sx of bleeding.     Dorrene German 06/24/2016 10:39 PM

## 2016-06-25 NOTE — Progress Notes (Signed)
ANTICOAGULATION CONSULT NOTE - f/u Consult  Pharmacy Consult for Argatroban Indication: pulmonary embolus  Allergies  Allergen Reactions  . Tramadol Palpitations    "Made my chest feel tight"  . Amoxicillin     REACTION: rash  . Penicillins Rash    Has patient had a PCN reaction causing immediate rash, facial/tongue/throat swelling, SOB or lightheadedness with hypotension: unknown Has patient had a PCN reaction causing severe rash involving mucus membranes or skin necrosis: unknown Has patient had a PCN reaction that required hospitalization: no  Has patient had a PCN reaction occurring within the last 10 years: no  If all of the above answers are "NO", then may proceed with Cephalosporin use.     Patient Measurements: Height: '5\' 10"'$  (177.8 cm) Weight: 216 lb 11.4 oz (98.3 kg) IBW/kg (Calculated) : 73  Vital Signs: Temp: 97.6 F (36.4 C) (12/16 0000) Temp Source: Oral (12/16 0000) BP: 112/82 (12/16 0331) Pulse Rate: 94 (12/16 0331)  Labs:  Recent Labs  06/24/16 1622 06/24/16 2151 06/25/16 0153 06/25/16 0357  HGB 9.2* 10.2*  --  8.7*  HCT 25.3* 28.8*  --  24.3*  PLT 95* 109*  --  86*  APTT  --  26 72* 48*  LABPROT  --  14.7  --   --   INR  --  1.14  --   --   CREATININE 0.86 0.99  --  0.94  TROPONINI <0.03  --   --   --     Estimated Creatinine Clearance: 79.8 mL/min (by C-G formula based on SCr of 0.94 mg/dL).   Medical History: Past Medical History:  Diagnosis Date  . Adenocarcinoma of right lung, stage 4 ([place]) 05/27/2016  . Adenomatous polyp of colon 1996  . Arthritis    Osteoarthritis right knee, spine, wrist  . Arthritis of hand, right    Right thumb (Dr. Daylene Katayama)  . Back ache    r/o Multiple Myeloma  . Benign prostatic hypertrophy    (Dr. Karsten Ro in past)  . Degenerative disc disease   . Diverticulosis   . DVT (deep venous thrombosis) ([place]) 01/2014   R calf  . Encounter for antineoplastic chemotherapy 10/20/2015  . Erosive esophagitis   . GERD  (gastroesophageal reflux disease)   . Hemorrhoids   . Hiatal hernia   . History of chronic prostatitis   . History of melanoma    Back (Dr. Wilhemina Bonito); early melanoma R arm 03/2011  . Multiple myeloma ([place]) 2013   skull lytic lesions--treated with radiation 07/2014  . Pulmonary embolism ([place]) 01/2014  . Radiation 07/21/14-08/05/14   left occipital parietal skull 25 gray  . SCC (squamous cell carcinoma) 08/04/15   right ear;dysplastic nevus with severe atypia-chest    Assessment: 73 yoM admitted on 12/15 with PE and pharmacy is consulted to dose argatroban IV.  PMH is significant for metastatic lung cancer and chronic anticoagulation for history of PE/DVT.  Recent admission 11/26-12/4 for SBO.  During that admission, Xarelto was held on 11/26 for NPO status and in case of surgical intervention.  He was started on IV Heparin on 11/28, but during the course of heparin, his Hgb trended down and Pltc trended down (baseline Pltc 120-170's, Pltc 68 on discharge). Heparin infusion was active from 11/28-12/1, but was held for low platelets on 12/1. No bleeding or complications were noted.  HIT panel not ordered at that time.  Xarelto was resumed on 12/3 and he was discharged on 12/4. Patient was re-admitted on 12/10  and was placed on Argatroban drip for PE until HIT ruled out. Heparin antibody came back elevated, however SRA negative. Patient transitioned to Apixaban per Oncology recommendations and discharged on 12/13.   Patient now presents with abdominal pain. CT scan shows recurrent SBO and mildly progressive RLL PE.   Last dose of Apixaban '10mg'$  PO PTA was this morning at 0800. FOBT +.  CBC shows Hgb 9.2 and improving Pltc of 95K. PT/INR, aPTT, and LFTs WNL.  Today, 12/16 0153 aPtt = 72 sec- just slighlty above goal range, no infusion or bleeding issues per RN. RN does report petechia on legs- but had PTA.  0357 aPtt = 48 sec, RN did pause drip for 60 sec flush, draw waste and then draw labs through  port. Also weight increased significantly in EPIC so with rate increase if appears as if pt receiving 0.68 mcg/kg/min (lower dose)  b/c of weight difference.   Goal of Therapy:  aPTT 50-70 seconds (per Dr. Blaine Hamper, would like to target lower end of goal range secondary to low Hgb, FOBT + on admission) Monitor platelets by anticoagulation protocol: Yes   Plan:   Wt increased in EPIC so will increase argatroban dose to 4 mg/hr (0.68 mcg/kg/min) ( targeting lower end of goal range).   Check aPTT 2 hours after initiation, then every 2 hours until two therapeutic levels obtained, then daily thereafter.   CBC at least daily while on argatroban (currently ordered q6h per MD).   Monitor closely for s/sx of bleeding.     Dorrene German 06/25/2016 5:07 AM

## 2016-06-25 NOTE — Progress Notes (Signed)
PCCM Progress Note  Subjective: Breathing okay.  Abd pain better.  Vital signs: BP 94/63   Pulse 85   Temp 98 F (36.7 C) (Oral)   Resp 12   Ht '5\' 10"'$  (1.778 m)   Wt 216 lb 11.4 oz (98.3 kg)   SpO2 97%   BMI 31.09 kg/m   Intake/output: I/O last 3 completed shifts: In: 1000 [IV Piggyback:1000] Out: -   General: pleasant Neuro: follows commands HEENT: NG tube in, no stridor Cardiac: regular, no murmur Chest: no wheeze Abd: distended, non tender Ext: 1+ edema Skin: no rashes  CMP Latest Ref Rng & Units 06/25/2016 06/24/2016 06/24/2016  Glucose 65 - 99 mg/dL 140(H) 111(H) 96  BUN 6 - 20 mg/dL '10 10 11  '$ Creatinine 0.61 - 1.24 mg/dL 0.94 0.99 0.86  Sodium 135 - 145 mmol/L 133(L) 129(L) 126(L)  Potassium 3.5 - 5.1 mmol/L 4.0 3.2(L) 3.3(L)  Chloride 101 - 111 mmol/L 99(L) 94(L) 91(L)  CO2 22 - 32 mmol/L '24 25 26  '$ Calcium 8.9 - 10.3 mg/dL 8.8(L) 8.7(L) 8.5(L)  Total Protein 6.5 - 8.1 g/dL - - 5.2(L)  Total Bilirubin 0.3 - 1.2 mg/dL - - 0.6  Alkaline Phos 38 - 126 U/L - - 70  AST 15 - 41 U/L - - 18  ALT 17 - 63 U/L - - 24   CBC Latest Ref Rng & Units 06/25/2016 06/24/2016 06/24/2016  WBC 4.0 - 10.5 K/uL 4.3 5.3 5.6  Hemoglobin 13.0 - 17.0 g/dL 8.7(L) 10.2(L) 9.2(L)  Hematocrit 39.0 - 52.0 % 24.3(L) 28.8(L) 25.3(L)  Platelets 150 - 400 K/uL 86(L) 109(L) 95(L)   CBG (last 3)  No results for input(s): GLUCAP in the last 72 hours.  Imaging: Dg Chest 2 View  Result Date: 06/24/2016 CLINICAL DATA:  Weakness and shortness of Breath EXAM: CHEST  2 VIEW COMPARISON:  06/19/2016 FINDINGS: Cardiac shadow is enlarged. Right chest wall port is again seen. Patchy infiltrates are again noted in the right lung. Improved aeration in the left base is noted. No new focal abnormality is seen. No bony abnormality is noted. IMPRESSION: Improved aeration on the left with stable infiltrates on the right. Electronically Signed   By: Inez Catalina M.D.   On: 06/24/2016 16:38   Ct Head Wo  Contrast  Result Date: 06/24/2016 CLINICAL DATA:  Weakness, nausea, shortness of breath, diagnosed with pulmonary embolism on 06/19/2016, headache, history melanoma, multiple myeloma, lung cancer EXAM: CT HEAD WITHOUT CONTRAST TECHNIQUE: Contiguous axial images were obtained from the base of the skull through the vertex without intravenous contrast. COMPARISON:  06/27/2016 FINDINGS: Brain: Generalized atrophy. Normal ventricular morphology. No midline shift or mass effect. Small vessel chronic ischemic changes of deep cerebral white matter. Previously identified metastases within the RIGHT cerebellum and RIGHT occipital lobe on prior contrast-enhanced CT and prior MR brain not identified by this noncontrast exam. No intracranial hemorrhage, visualized mass lesion, evidence acute infarction, or extra-axial fluid collection. Vascular: Unremarkable Skull: Intact Sinuses/Orbits: Probable small osteoma of the LEFT frontal sinus. Remaining paranasal sinuses and mastoid air cells clear. Other: N/A IMPRESSION: Atrophy with small vessel chronic ischemic changes of deep cerebral white matter. No acute intracranial abnormalities. Enhancing lesions within the RIGHT cerebellum and RIGHT occipital lobe consistent with metastases as identified on the previous exam are not identified by the current noncontrast study. Electronically Signed   By: Lavonia Dana M.D.   On: 06/24/2016 17:54   Ct Angio Chest Pe W And/or Wo Contrast  Result Date: 06/24/2016  CLINICAL DATA:  Shortness of breath. Recently diagnosed with pulmonary embolism. Taking Eliquis. Right chest pain. Stage IV metastatic small cell lung cancer. Multiple myeloma. EXAM: CT ANGIOGRAPHY CHEST CT ABDOMEN AND PELVIS WITH CONTRAST TECHNIQUE: Multidetector CT imaging of the chest was performed using the standard protocol during bolus administration of intravenous contrast. Multiplanar CT image reconstructions and MIPs were obtained to evaluate the vascular anatomy.  Multidetector CT imaging of the abdomen and pelvis was performed using the standard protocol during bolus administration of intravenous contrast. CONTRAST:  100 cc Isovue 370 COMPARISON:  Chest CTA dated 06/19/2016 and abdomen and pelvis CT dated 06/10/2016. FINDINGS: CTA CHEST FINDINGS Cardiovascular: Mildly progressive subsegmental pulmonary embolus in the right lower lobe. The remainder of the previously demonstrated segmental and subsegmental right lower lobe and right upper lobe pulmonary emboli have not changed significantly. No definite left lower lobe pulmonary emboli are seen today. Mediastinum/Nodes: The previously demonstrated hypodense left lobe thyroid nodule is not clearly visible today. The previously demonstrated 1.5 cm short axis right peritracheal node has a short axis diameter of 1.5 cm on image number 30 of series 4. The previously demonstrated 1.2 cm short axis subcarinal node right has a short axis diameter of 1.4 cm on image number 38 of series 4. The previously demonstrated 1.7 cm short axis diameter right hilar node has a short axis diameter of 1.8 cm on image number 38 of series 4. A previously demonstrated 1.2 cm short axis left hilar node has a short axis diameter of 1.3 cm on image number 40 of series 4. Lungs/Pleura: No significant change in a small right pleural effusion. The previously demonstrated 1.5 x 1.2 cm irregular central right upper lobe nodule 1 measures 1.4 x 1.0 cm on image number 33 of series 12. The previously demonstrated 3 mm right middle lobe nodule measures 6 mm on image number 38 of series 12. Mildly progressive consolidated right middle lobe atelectasis. Progressive patchy interstitial opacities in both upper lobes. Mild right lower lobe atelectasis with improvement. Musculoskeletal: Thoracic spine degenerative changes. Review of the MIP images confirms the above findings. CT ABDOMEN and PELVIS FINDINGS Hepatobiliary: No focal liver abnormality is seen. No  gallstones, gallbladder wall thickening, or biliary dilatation. A small gallbladder fundal nodule with enhancement is again demonstrated. Pancreas: Diffusely atrophied. Spleen: Normal in size without focal abnormality. Adrenals/Urinary Tract: Stable left renal cortical scar. Normal appearing adrenal glands and right kidney. Distended urinary bladder with mildly dilated ureters. No hydronephrosis. Stomach/Bowel: Interval multiple dilated proximal small bowel loops with normal caliber distal small bowel loops. Unremarkable stomach and colon. No evidence of appendicitis. Vascular/Lymphatic: Mild atheromatous arterial calcifications, including the abdominal aorta. No enlarged lymph nodes. Reproductive: Minimally enlarged prostate gland. Other: Tiny amount of free peritoneal fluid in the posterior pelvis on the right. Musculoskeletal: Lumbar spine degenerative changes. Review of the MIP images confirms the above findings. IMPRESSION: 1. Mildly progressive right lower lobe pulmonary emboli. 2. Stable central right upper lobe pulmonary emboli. 3. Interval obstruction of the mid small bowel. The cause of obstruction is not identified. This is most likely due to an adhesion. 4. Stable small enhancing mass in the gallbladder fundus. 5. Progressive patchy interstitial opacities in both lungs. This could be due to infection, drug allergy, or patchy interstitial edema. 6. Stable small right pleural effusion with decreased right lower lobe atelectasis. 7. Little change in 2 right lung nodules. 8. Little change in mediastinal and bilateral hilar adenopathy. 9. Mild aortic atherosclerosis. Electronically Signed   By:  Claudie Revering M.D.   On: 06/24/2016 18:30   Ct Abdomen Pelvis W Contrast  Result Date: 06/24/2016 CLINICAL DATA:  Shortness of breath. Recently diagnosed with pulmonary embolism. Taking Eliquis. Right chest pain. Stage IV metastatic small cell lung cancer. Multiple myeloma. EXAM: CT ANGIOGRAPHY CHEST CT ABDOMEN AND  PELVIS WITH CONTRAST TECHNIQUE: Multidetector CT imaging of the chest was performed using the standard protocol during bolus administration of intravenous contrast. Multiplanar CT image reconstructions and MIPs were obtained to evaluate the vascular anatomy. Multidetector CT imaging of the abdomen and pelvis was performed using the standard protocol during bolus administration of intravenous contrast. CONTRAST:  100 cc Isovue 370 COMPARISON:  Chest CTA dated 06/19/2016 and abdomen and pelvis CT dated 06/10/2016. FINDINGS: CTA CHEST FINDINGS Cardiovascular: Mildly progressive subsegmental pulmonary embolus in the right lower lobe. The remainder of the previously demonstrated segmental and subsegmental right lower lobe and right upper lobe pulmonary emboli have not changed significantly. No definite left lower lobe pulmonary emboli are seen today. Mediastinum/Nodes: The previously demonstrated hypodense left lobe thyroid nodule is not clearly visible today. The previously demonstrated 1.5 cm short axis right peritracheal node has a short axis diameter of 1.5 cm on image number 30 of series 4. The previously demonstrated 1.2 cm short axis subcarinal node right has a short axis diameter of 1.4 cm on image number 38 of series 4. The previously demonstrated 1.7 cm short axis diameter right hilar node has a short axis diameter of 1.8 cm on image number 38 of series 4. A previously demonstrated 1.2 cm short axis left hilar node has a short axis diameter of 1.3 cm on image number 40 of series 4. Lungs/Pleura: No significant change in a small right pleural effusion. The previously demonstrated 1.5 x 1.2 cm irregular central right upper lobe nodule 1 measures 1.4 x 1.0 cm on image number 33 of series 12. The previously demonstrated 3 mm right middle lobe nodule measures 6 mm on image number 38 of series 12. Mildly progressive consolidated right middle lobe atelectasis. Progressive patchy interstitial opacities in both upper  lobes. Mild right lower lobe atelectasis with improvement. Musculoskeletal: Thoracic spine degenerative changes. Review of the MIP images confirms the above findings. CT ABDOMEN and PELVIS FINDINGS Hepatobiliary: No focal liver abnormality is seen. No gallstones, gallbladder wall thickening, or biliary dilatation. A small gallbladder fundal nodule with enhancement is again demonstrated. Pancreas: Diffusely atrophied. Spleen: Normal in size without focal abnormality. Adrenals/Urinary Tract: Stable left renal cortical scar. Normal appearing adrenal glands and right kidney. Distended urinary bladder with mildly dilated ureters. No hydronephrosis. Stomach/Bowel: Interval multiple dilated proximal small bowel loops with normal caliber distal small bowel loops. Unremarkable stomach and colon. No evidence of appendicitis. Vascular/Lymphatic: Mild atheromatous arterial calcifications, including the abdominal aorta. No enlarged lymph nodes. Reproductive: Minimally enlarged prostate gland. Other: Tiny amount of free peritoneal fluid in the posterior pelvis on the right. Musculoskeletal: Lumbar spine degenerative changes. Review of the MIP images confirms the above findings. IMPRESSION: 1. Mildly progressive right lower lobe pulmonary emboli. 2. Stable central right upper lobe pulmonary emboli. 3. Interval obstruction of the mid small bowel. The cause of obstruction is not identified. This is most likely due to an adhesion. 4. Stable small enhancing mass in the gallbladder fundus. 5. Progressive patchy interstitial opacities in both lungs. This could be due to infection, drug allergy, or patchy interstitial edema. 6. Stable small right pleural effusion with decreased right lower lobe atelectasis. 7. Little change in 2 right  lung nodules. 8. Little change in mediastinal and bilateral hilar adenopathy. 9. Mild aortic atherosclerosis. Electronically Signed   By: Claudie Revering M.D.   On: 06/24/2016 18:30   Dg Abd Portable  1v  Result Date: 06/25/2016 CLINICAL DATA:  75 year old male with small bowel obstruction status post NG tube placement. EXAM: PORTABLE ABDOMEN - 1 VIEW COMPARISON:  Abdominal CT dated 06/24/2016 FINDINGS: An enteric tube is partially visualized with side port and tip over the epigastric area likely in the proximal stomach. Recommend advancing the tube by approximately 4 cm for optimal positioning. Multiple dilated air-filled loops of small bowel again noted measuring up to 5 cm in diameter. No free air or radiopaque calculi identified. There is osteopenia with advanced degenerative changes of the spine. No acute fracture. IMPRESSION: Enteric tube with tip and side-port likely in the proximal stomach. Persistent dilated small bowel loops.  Follow-up recommended. Electronically Signed   By: Anner Crete M.D.   On: 06/25/2016 02:03    Summary: 75 yo male with abdominal pain from SBO.  He has recent dx of PE in setting of Stage IV  NSCLC.  He has hx of heparin induced thrombocytopenia.  Assessment/plan:  Recent dx of PE with hx of HIT. - continue argatroban - transition to [place] when okay to take PO meds  SBO. - per primary team  PCCM will sign off.  Chesley Mires, MD [facility] Pulmonary/Critical Care 06/25/2016, 6:39 AM Pager:  [phone] After 3pm call: [phone]

## 2016-06-25 NOTE — Progress Notes (Signed)
Subjective: FEELS BETTER WITH NGT IN PLACE  NO BM  OCCASIONAL FLATUS   Objective: Vital signs in last 24 hours: Temp:  [97.5 F (36.4 C)-98 F (36.7 C)] 97.8 F (36.6 C) (12/16 0800) Pulse Rate:  [82-100] 85 (12/16 0600) Resp:  [11-23] 12 (12/16 0600) BP: (84-120)/(55-82) 94/63 (12/16 0600) SpO2:  [93 %-99 %] 97 % (12/16 0600) Weight:  [86.2 kg (190 lb)-98.3 kg (216 lb 11.4 oz)] 98.3 kg (216 lb 11.4 oz) (12/15 2200) Last BM Date: 06/23/16  Intake/Output from previous day: 12/15 0701 - 12/16 0700 In: 2061.9 [P.O.:240; I.V.:821.9; IV Piggyback:1000] Out: 2200 [Urine:2050; Emesis/NG output:150] Intake/Output this shift: No intake/output data recorded.  GI: SOFT BUT STILL DISTENDED NO PERITONITIS   Lab Results:   Recent Labs  06/24/16 2151 06/25/16 0357  WBC 5.3 4.3  HGB 10.2* 8.7*  HCT 28.8* 24.3*  PLT 109* 86*   BMET  Recent Labs  06/24/16 2151 06/25/16 0357  NA 129* 133*  K 3.2* 4.0  CL 94* 99*  CO2 25 24  GLUCOSE 111* 140*  BUN 10 10  CREATININE 0.99 0.94  CALCIUM 8.7* 8.8*   PT/INR  Recent Labs  06/24/16 2151  LABPROT 14.7  INR 1.14   ABG No results for input(s): PHART, HCO3 in the last 72 hours.  Invalid input(s): PCO2, PO2  Studies/Results: Dg Chest 2 View  Result Date: 06/24/2016 CLINICAL DATA:  Weakness and shortness of Breath EXAM: CHEST  2 VIEW COMPARISON:  06/19/2016 FINDINGS: Cardiac shadow is enlarged. Right chest wall port is again seen. Patchy infiltrates are again noted in the right lung. Improved aeration in the left base is noted. No new focal abnormality is seen. No bony abnormality is noted. IMPRESSION: Improved aeration on the left with stable infiltrates on the right. Electronically Signed   By: Inez Catalina M.D.   On: 06/24/2016 16:38   Ct Head Wo Contrast  Result Date: 06/24/2016 CLINICAL DATA:  Weakness, nausea, shortness of breath, diagnosed with pulmonary embolism on 06/19/2016, headache, history melanoma, multiple  myeloma, lung cancer EXAM: CT HEAD WITHOUT CONTRAST TECHNIQUE: Contiguous axial images were obtained from the base of the skull through the vertex without intravenous contrast. COMPARISON:  06/27/2016 FINDINGS: Brain: Generalized atrophy. Normal ventricular morphology. No midline shift or mass effect. Small vessel chronic ischemic changes of deep cerebral white matter. Previously identified metastases within the RIGHT cerebellum and RIGHT occipital lobe on prior contrast-enhanced CT and prior MR brain not identified by this noncontrast exam. No intracranial hemorrhage, visualized mass lesion, evidence acute infarction, or extra-axial fluid collection. Vascular: Unremarkable Skull: Intact Sinuses/Orbits: Probable small osteoma of the LEFT frontal sinus. Remaining paranasal sinuses and mastoid air cells clear. Other: N/A IMPRESSION: Atrophy with small vessel chronic ischemic changes of deep cerebral white matter. No acute intracranial abnormalities. Enhancing lesions within the RIGHT cerebellum and RIGHT occipital lobe consistent with metastases as identified on the previous exam are not identified by the current noncontrast study. Electronically Signed   By: Lavonia Dana M.D.   On: 06/24/2016 17:54   Ct Angio Chest Pe W And/or Wo Contrast  Result Date: 06/24/2016 CLINICAL DATA:  Shortness of breath. Recently diagnosed with pulmonary embolism. Taking Eliquis. Right chest pain. Stage IV metastatic small cell lung cancer. Multiple myeloma. EXAM: CT ANGIOGRAPHY CHEST CT ABDOMEN AND PELVIS WITH CONTRAST TECHNIQUE: Multidetector CT imaging of the chest was performed using the standard protocol during bolus administration of intravenous contrast. Multiplanar CT image reconstructions and MIPs were obtained to evaluate the  vascular anatomy. Multidetector CT imaging of the abdomen and pelvis was performed using the standard protocol during bolus administration of intravenous contrast. CONTRAST:  100 cc Isovue 370  COMPARISON:  Chest CTA dated 06/19/2016 and abdomen and pelvis CT dated 06/10/2016. FINDINGS: CTA CHEST FINDINGS Cardiovascular: Mildly progressive subsegmental pulmonary embolus in the right lower lobe. The remainder of the previously demonstrated segmental and subsegmental right lower lobe and right upper lobe pulmonary emboli have not changed significantly. No definite left lower lobe pulmonary emboli are seen today. Mediastinum/Nodes: The previously demonstrated hypodense left lobe thyroid nodule is not clearly visible today. The previously demonstrated 1.5 cm short axis right peritracheal node has a short axis diameter of 1.5 cm on image number 30 of series 4. The previously demonstrated 1.2 cm short axis subcarinal node right has a short axis diameter of 1.4 cm on image number 38 of series 4. The previously demonstrated 1.7 cm short axis diameter right hilar node has a short axis diameter of 1.8 cm on image number 38 of series 4. A previously demonstrated 1.2 cm short axis left hilar node has a short axis diameter of 1.3 cm on image number 40 of series 4. Lungs/Pleura: No significant change in a small right pleural effusion. The previously demonstrated 1.5 x 1.2 cm irregular central right upper lobe nodule 1 measures 1.4 x 1.0 cm on image number 33 of series 12. The previously demonstrated 3 mm right middle lobe nodule measures 6 mm on image number 38 of series 12. Mildly progressive consolidated right middle lobe atelectasis. Progressive patchy interstitial opacities in both upper lobes. Mild right lower lobe atelectasis with improvement. Musculoskeletal: Thoracic spine degenerative changes. Review of the MIP images confirms the above findings. CT ABDOMEN and PELVIS FINDINGS Hepatobiliary: No focal liver abnormality is seen. No gallstones, gallbladder wall thickening, or biliary dilatation. A small gallbladder fundal nodule with enhancement is again demonstrated. Pancreas: Diffusely atrophied. Spleen: Normal  in size without focal abnormality. Adrenals/Urinary Tract: Stable left renal cortical scar. Normal appearing adrenal glands and right kidney. Distended urinary bladder with mildly dilated ureters. No hydronephrosis. Stomach/Bowel: Interval multiple dilated proximal small bowel loops with normal caliber distal small bowel loops. Unremarkable stomach and colon. No evidence of appendicitis. Vascular/Lymphatic: Mild atheromatous arterial calcifications, including the abdominal aorta. No enlarged lymph nodes. Reproductive: Minimally enlarged prostate gland. Other: Tiny amount of free peritoneal fluid in the posterior pelvis on the right. Musculoskeletal: Lumbar spine degenerative changes. Review of the MIP images confirms the above findings. IMPRESSION: 1. Mildly progressive right lower lobe pulmonary emboli. 2. Stable central right upper lobe pulmonary emboli. 3. Interval obstruction of the mid small bowel. The cause of obstruction is not identified. This is most likely due to an adhesion. 4. Stable small enhancing mass in the gallbladder fundus. 5. Progressive patchy interstitial opacities in both lungs. This could be due to infection, drug allergy, or patchy interstitial edema. 6. Stable small right pleural effusion with decreased right lower lobe atelectasis. 7. Little change in 2 right lung nodules. 8. Little change in mediastinal and bilateral hilar adenopathy. 9. Mild aortic atherosclerosis. Electronically Signed   By: Claudie Revering M.D.   On: 06/24/2016 18:30   Ct Abdomen Pelvis W Contrast  Result Date: 06/24/2016 CLINICAL DATA:  Shortness of breath. Recently diagnosed with pulmonary embolism. Taking Eliquis. Right chest pain. Stage IV metastatic small cell lung cancer. Multiple myeloma. EXAM: CT ANGIOGRAPHY CHEST CT ABDOMEN AND PELVIS WITH CONTRAST TECHNIQUE: Multidetector CT imaging of the chest was performed using  the standard protocol during bolus administration of intravenous contrast. Multiplanar CT  image reconstructions and MIPs were obtained to evaluate the vascular anatomy. Multidetector CT imaging of the abdomen and pelvis was performed using the standard protocol during bolus administration of intravenous contrast. CONTRAST:  100 cc Isovue 370 COMPARISON:  Chest CTA dated 06/19/2016 and abdomen and pelvis CT dated 06/10/2016. FINDINGS: CTA CHEST FINDINGS Cardiovascular: Mildly progressive subsegmental pulmonary embolus in the right lower lobe. The remainder of the previously demonstrated segmental and subsegmental right lower lobe and right upper lobe pulmonary emboli have not changed significantly. No definite left lower lobe pulmonary emboli are seen today. Mediastinum/Nodes: The previously demonstrated hypodense left lobe thyroid nodule is not clearly visible today. The previously demonstrated 1.5 cm short axis right peritracheal node has a short axis diameter of 1.5 cm on image number 30 of series 4. The previously demonstrated 1.2 cm short axis subcarinal node right has a short axis diameter of 1.4 cm on image number 38 of series 4. The previously demonstrated 1.7 cm short axis diameter right hilar node has a short axis diameter of 1.8 cm on image number 38 of series 4. A previously demonstrated 1.2 cm short axis left hilar node has a short axis diameter of 1.3 cm on image number 40 of series 4. Lungs/Pleura: No significant change in a small right pleural effusion. The previously demonstrated 1.5 x 1.2 cm irregular central right upper lobe nodule 1 measures 1.4 x 1.0 cm on image number 33 of series 12. The previously demonstrated 3 mm right middle lobe nodule measures 6 mm on image number 38 of series 12. Mildly progressive consolidated right middle lobe atelectasis. Progressive patchy interstitial opacities in both upper lobes. Mild right lower lobe atelectasis with improvement. Musculoskeletal: Thoracic spine degenerative changes. Review of the MIP images confirms the above findings. CT ABDOMEN and  PELVIS FINDINGS Hepatobiliary: No focal liver abnormality is seen. No gallstones, gallbladder wall thickening, or biliary dilatation. A small gallbladder fundal nodule with enhancement is again demonstrated. Pancreas: Diffusely atrophied. Spleen: Normal in size without focal abnormality. Adrenals/Urinary Tract: Stable left renal cortical scar. Normal appearing adrenal glands and right kidney. Distended urinary bladder with mildly dilated ureters. No hydronephrosis. Stomach/Bowel: Interval multiple dilated proximal small bowel loops with normal caliber distal small bowel loops. Unremarkable stomach and colon. No evidence of appendicitis. Vascular/Lymphatic: Mild atheromatous arterial calcifications, including the abdominal aorta. No enlarged lymph nodes. Reproductive: Minimally enlarged prostate gland. Other: Tiny amount of free peritoneal fluid in the posterior pelvis on the right. Musculoskeletal: Lumbar spine degenerative changes. Review of the MIP images confirms the above findings. IMPRESSION: 1. Mildly progressive right lower lobe pulmonary emboli. 2. Stable central right upper lobe pulmonary emboli. 3. Interval obstruction of the mid small bowel. The cause of obstruction is not identified. This is most likely due to an adhesion. 4. Stable small enhancing mass in the gallbladder fundus. 5. Progressive patchy interstitial opacities in both lungs. This could be due to infection, drug allergy, or patchy interstitial edema. 6. Stable small right pleural effusion with decreased right lower lobe atelectasis. 7. Little change in 2 right lung nodules. 8. Little change in mediastinal and bilateral hilar adenopathy. 9. Mild aortic atherosclerosis. Electronically Signed   By: Claudie Revering M.D.   On: 06/24/2016 18:30   Dg Abd Portable 1v  Result Date: 06/25/2016 CLINICAL DATA:  75 year old male with small bowel obstruction status post NG tube placement. EXAM: PORTABLE ABDOMEN - 1 VIEW COMPARISON:  Abdominal CT dated  06/24/2016 FINDINGS: An enteric tube is partially visualized with side port and tip over the epigastric area likely in the proximal stomach. Recommend advancing the tube by approximately 4 cm for optimal positioning. Multiple dilated air-filled loops of small bowel again noted measuring up to 5 cm in diameter. No free air or radiopaque calculi identified. There is osteopenia with advanced degenerative changes of the spine. No acute fracture. IMPRESSION: Enteric tube with tip and side-port likely in the proximal stomach. Persistent dilated small bowel loops.  Follow-up recommended. Electronically Signed   By: Anner Crete M.D.   On: 06/25/2016 02:03    Anti-infectives: Anti-infectives    Start     Dose/Rate Route Frequency Ordered Stop   06/25/16 1000  levofloxacin (LEVAQUIN) tablet 500 mg     500 mg Oral Daily 06/24/16 2026        Assessment/Plan: Patient Active Problem List   Diagnosis Date Noted  . GERD (gastroesophageal reflux disease) 06/24/2016  . Chronic diastolic CHF (congestive heart failure) ([place]) 06/24/2016  . GIB (gastrointestinal bleeding) 06/24/2016  . Malnutrition of moderate degree 06/20/2016  . Malignant neoplasm of upper lobe of right lung ([place])   . Pulmonary emboli ([place]) 06/19/2016  . Pulmonary embolus ([place]) 06/19/2016  . Abnormal CT scan, small bowel   . Encephalopathy, metabolic   . Thrombocytopenia ([place])   . Diabetes insipidus secondary to vasopressin deficiency ([place])   . Hypernatremia   . SBO (small bowel obstruction) 06/06/2016  . Hypokalemia 06/06/2016  . Adenocarcinoma of right lung, stage 4 ([place]) 05/27/2016  . Nausea with vomiting 05/18/2016  . Dehydration 05/18/2016  . Brain metastases ([place]) 05/04/2016  . Mediastinal adenopathy 05/04/2016  . Peripheral neuropathy due to chemotherapy ([place]) 02/08/2016  . Long term current use of anticoagulant therapy 11/04/2014  . Peripheral edema 11/04/2014  . Knee pain, bilateral 11/04/2014  . Headache 07/14/2014   . Abdominal aortic atherosclerosis ([place]) 04/28/2014  . History of pulmonary embolism 02/07/2014  . Acute deep vein thrombosis (DVT) of tibial vein of right lower extremity ([place]) 02/07/2014  . DVT of leg (deep venous thrombosis) ([place]) 02/06/2014  . Multiple myeloma ([place]) 01/27/2012  . Hyponatremia 01/16/2012  . Anemia 01/16/2012  . Dyspnea 04/18/2011  . Preop cardiovascular exam 04/18/2011  . ABDOMINAL PAIN-RUQ 04/29/2010  . IRRITABLE BOWEL SYNDROME 02/19/2009  . GERD 01/03/2008  . PERSONAL HX COLONIC POLYPS 01/03/2008  Start SB protocol today.  Keep NGT and can have ice chips otherwise NPO      LOS: 1 day    Wendy Mikles A. 06/25/2016

## 2016-06-25 NOTE — Progress Notes (Addendum)
ANTICOAGULATION CONSULT NOTE - f/u Consult  Pharmacy Consult for Argatroban Indication: pulmonary embolus  Allergies  Allergen Reactions  . Tramadol Palpitations    "Made my chest feel tight"  . Amoxicillin     REACTION: rash  . Penicillins Rash    Has patient had a PCN reaction causing immediate rash, facial/tongue/throat swelling, SOB or lightheadedness with hypotension: unknown Has patient had a PCN reaction causing severe rash involving mucus membranes or skin necrosis: unknown Has patient had a PCN reaction that required hospitalization: no  Has patient had a PCN reaction occurring within the last 10 years: no  If all of the above answers are "NO", then may proceed with Cephalosporin use.     Patient Measurements: Height: '5\' 10"'$  (177.8 cm) Weight: 216 lb 11.4 oz (98.3 kg) IBW/kg (Calculated) : 73  Vital Signs: Temp: 97.8 F (36.6 C) (12/16 0800) Temp Source: Oral (12/16 0800) BP: 94/63 (12/16 0600) Pulse Rate: 85 (12/16 0600)  Labs:  Recent Labs  06/24/16 1622  06/24/16 2151 06/25/16 0153 06/25/16 0357 06/25/16 0819  HGB 9.2*  --  10.2*  --  8.7* 9.0*  HCT 25.3*  --  28.8*  --  24.3* 25.6*  PLT 95*  --  109*  --  86* 90*  APTT  --   < > 26 72* 48* 54*  LABPROT  --   --  14.7  --   --   --   INR  --   --  1.14  --   --   --   CREATININE 0.86  --  0.99  --  0.94  --   TROPONINI <0.03  --   --   --   --   --   < > = values in this interval not displayed.  Estimated Creatinine Clearance: 79.8 mL/min (by C-G formula based on SCr of 0.94 mg/dL).  Assessment: 38 yoM admitted on 12/15 with PE and pharmacy is consulted to dose argatroban IV.  PMH is significant for metastatic lung cancer and chronic anticoagulation for history of PE/DVT.  Recent admission 11/26-12/4 for SBO.  During that admission, Xarelto was held on 11/26 for NPO status and in case of surgical intervention.  He was started on IV Heparin on 11/28, but during the course of heparin, his Hgb trended  down and Pltc trended down (baseline Pltc 120-170's, Pltc 68 on discharge). Heparin infusion was active from 11/28-12/1, but was held for low platelets on 12/1. No bleeding or complications were noted.  HIT panel not ordered at that time.  Xarelto was resumed on 12/3 and he was discharged on 12/4. Patient was re-admitted on 12/10 and was placed on Argatroban drip for PE until HIT ruled out. Heparin antibody came back slightly elevated, however SRA negative. Patient transitioned to Apixaban per Oncology recommendations and discharged on 12/13.   On 12/15, Patient presents with abdominal pain. CT scan shows recurrent SBO and mildly progressive RLL PE.   Last dose of Apixaban '10mg'$  PO PTA was 12/15 at 0800. FOBT +.   Today, 12/16  0800 aPtt = 54 sec (at lower end goal). RN does report petechia on legs- but had PTA.  This was a peripheral stick unlike previous result when aPTT = 48sec  CBC: Hgb stable, pltc decreased but stable  + FOBT but no obvious bleeding noted  Note - 10kg weight difference from weight on recent admission so although wt based dose decreased, rate actually increased  Likelihood of HIT in this patient  is very low as HIT ab was just slightly positive and SRA was negative  Goal of Therapy:  aPTT 50-70 seconds (per Dr. Blaine Hamper, would like to target lower end of goal range secondary to low Hgb, FOBT + on admission) Monitor platelets by anticoagulation protocol: Yes   Plan:   APTT therapeutic, continue 4 m//hr (0.68 mcg/kg/min) ( targeting lower end of goal range per MD request).   Check aPTT every 2 hours until two therapeutic levels obtained, then daily thereafter.   CBC at least daily while on argatroban (currently ordered q6h per MD).   Monitor closely for s/sx of bleeding.  Doreene Eland, PharmD, BCPS.   Pager: [phone] 06/25/2016 9:02 AM

## 2016-06-25 NOTE — Progress Notes (Signed)
[facility] PROGRESS NOTE  [name] DJM:[id number] DOB: [birth year] DOA: 06/24/2016 PCP: [name], MD  Interim summary and HPI 75 y.o. male with medical history significant of recently diagnosed stage IV NSCLC (80% PDL1+) being treated with pembrolizomab (Keytruda, immunotherapy), recent SBO (which resolved with conservative management), DVT, multifocal PE (with possible HIT) on Eliquis now, GERD, multiple myeloma with skull lesion (s/p of radiation therapy 2016), chronic prostatitis, BPH, dCHF and diabetes insipidus on DDAVP, who presents with shortness of breath abdominal pain. Found to have recurrent partial SBO. Patient also reported intermittent episodes of chest discomfort (especially with deep breathing). Patient was FOBT in ED.  Assessment/Plan: Partial SBO: this is recurrent partial small bowel obstruction. General surgeon, Dr. Malachi Paradise was consulted-->concern for carcinomatosis.  -will continue conservative management for stabilization -continue NGT and strict NPO status  -general surgery on board and assessing with SB protocol -Highly appreciate Dr. Josetta Huddle recommendations -will replete electrolytes as needed and provide PRN antiemetics and analgesics.  Pulmonary Emboli: -will continue IV argatroban per pharm  -no sign sof infection on x-ray or CT chest. Will stop abx's. Especially given NPO status. -patient with recent 2-D echo on recent admission for PE and suggested mild heart right strain. There is no signs of fluid overload and currently hemodynamically stable. Will hold on repeat echo and continue anticoagulation with agatroban for now. -Eliquis on hold in case surgery is needed   Positive FOBT: Patient does not seem to have significant GI bleeding since hemoglobin is stable. -will continue IV pantoprazole and will follow Hgb trend  -blood transfusion as needed   Stage IV Lung Cancer: patient actively followed by Dr. [name]. On immunotherapy, last dose  of [name] was 6 weeks ago per pt.  -continue outpatient f/u with Dr. [name]  Chronic diastolic CHF: 2-D echo on 06/2016 showed EF 60-65 percent with grade 1 diastolic dysfunction.  -no edema, no crackles on exam and no vascular congestion seen on his CXR -appears to be compensated overall -will follow volume status and daily weight  Hyponatremia: sodium was 126 on admission. -holding PO meds for now -after IVF's sodium is now 136 -will monitor and resume IV DDAVP if needed   Headache: most likely due to primary metastasized disease. MRI of brain on 05/12/16 showed metastasized disease. -Continue Decadron 2.0 mg twice a day -follow any rec's from oncology service  Hypokalemia: K= 3.3 on admission. -will monitor and replete as needed   Malnutrition of moderate degree: -nutrition consult requested -patient NPO now due to partial SBO  GERD: -continue PPI  BPH: -hold flomax due to low Bp on presentation and NPO status. -will monitor closely for signs of urinary retention   Code Status: Full Family Communication: no family at bedside  Disposition Plan: remains in stepdown for now; continue IV agatroban therapy and strict NPO status. General surgery evaluating SB protocol. No BM's  Consultants:  General surgery  PCCM  Procedures:  See below for x-ray reports   Antibiotics:  levaquin 12/13>>12/16  HPI/Subjective: Afebrile, no CP or SOB currently. Patient reports abd discomfort improved after NGT placed. No nausea or vomiting. Endorses occasional flatus, no BM's.  Objective: Vitals:   06/25/16 0600 06/25/16 0800  BP: 94/63   Pulse: 85   Resp: 12   Temp:  97.8 F (36.6 C)    Intake/Output Summary (Last 24 hours) at 06/25/16 0942 Last data filed at 06/25/16 2229  Gross per 24 hour  Intake  2061.9 ml  Output             2200 ml  Net           -138.1 ml   Filed Weights   06/24/16 1936 06/24/16 2200  Weight: 86.2 kg (190 lb) 98.3 kg (216 lb  11.4 oz)    Exam:   General:  Non-septic, denies CP and SOB currently. Abdomen feels better after NGT placed.  Cardiovascular: S1 and S2, no rubs, no gallops, no murmrus  Respiratory: good air movement, no crackles, no wheezing   Abdomen: no guarding, very little BS, mildly tender to palpation (RLQ)  Musculoskeletal: no edema, no cyanosis   Data Reviewed: Basic Metabolic Panel:  Recent Labs Lab 06/21/16 0501 06/24/16 1622 06/24/16 2151 06/25/16 0357 06/25/16 0819  NA 130* 126* 129* 133* 136  K 3.6 3.3* 3.2* 4.0 4.0  CL 99* 91* 94* 99* 104  CO2 '26 26 25 24 26  '$ GLUCOSE 139* 96 111* 140* 132*  BUN '12 11 10 10 9  '$ CREATININE 0.69 0.86 0.99 0.94 0.90  CALCIUM 7.6* 8.5* 8.7* 8.8* 9.0  MG  --   --   --  1.5*  --    Liver Function Tests:  Recent Labs Lab 06/19/16 1210 06/19/16 1615 06/24/16 1622  AST '15 17 18  '$ ALT '21 21 24  '$ ALKPHOS 65 70 70  BILITOT 0.9 1.0 0.6  PROT 5.2* 5.4* 5.2*  ALBUMIN 2.6* 2.7* 2.5*    Recent Labs Lab 06/24/16 1622  LIPASE 21   CBC:  Recent Labs Lab 06/19/16 1210  06/22/16 0419 06/24/16 1622 06/24/16 2151 06/25/16 0357 06/25/16 0819  WBC 5.7  < > 5.0 5.6 5.3 4.3 4.3  NEUTROABS 5.3  --   --  5.0  --   --   --   HGB 10.1*  < > 7.8* 9.2* 10.2* 8.7* 9.0*  HCT 27.9*  < > 22.5* 25.3* 28.8* 24.3* 25.6*  MCV 89.4  < > 94.1 90.4 89.7 92.0 92.4  PLT 75*  < > 70* 95* 109* 86* 90*  < > = values in this interval not displayed. Cardiac Enzymes:  Recent Labs Lab 06/24/16 1622  TROPONINI <0.03   BNP (last 3 results)  Recent Labs  06/05/16 2354 06/19/16 1210 06/24/16 2151  BNP 45.8 33.6 94.7   CBG:  Recent Labs Lab 06/25/16 0750  GLUCAP 129*    Recent Results (from the past 240 hour(s))  MRSA PCR Screening     Status: None   Collection Time: 06/19/16  4:10 PM  Result Value Ref Range Status   MRSA by PCR NEGATIVE NEGATIVE Final    Comment:        The GeneXpert MRSA Assay (FDA approved for NASAL specimens only), is  one component of a comprehensive MRSA colonization surveillance program. It is not intended to diagnose MRSA infection nor to guide or monitor treatment for MRSA infections.   MRSA PCR Screening     Status: None   Collection Time: 06/25/16 12:27 AM  Result Value Ref Range Status   MRSA by PCR NEGATIVE NEGATIVE Final    Comment:        The GeneXpert MRSA Assay (FDA approved for NASAL specimens only), is one component of a comprehensive MRSA colonization surveillance program. It is not intended to diagnose MRSA infection nor to guide or monitor treatment for MRSA infections.      Studies: Dg Chest 2 View  Result Date: 06/24/2016 CLINICAL DATA:  Weakness and  shortness of Breath EXAM: CHEST  2 VIEW COMPARISON:  06/19/2016 FINDINGS: Cardiac shadow is enlarged. Right chest wall port is again seen. Patchy infiltrates are again noted in the right lung. Improved aeration in the left base is noted. No new focal abnormality is seen. No bony abnormality is noted. IMPRESSION: Improved aeration on the left with stable infiltrates on the right. Electronically Signed   By: Inez Catalina M.D.   On: 06/24/2016 16:38   Dg Abd 1 View  Result Date: 06/25/2016 CLINICAL DATA:  Small bowel obstruction EXAM: ABDOMEN - 1 VIEW COMPARISON:  06/25/2016 FINDINGS: Dilated small bowel loops noted within the abdomen compatible with small bowel obstruction. There may have been slight interval improvement in degree of distention. NG tube tip is in the proximal stomach with the side port near the GE junction. IMPRESSION: Small bowel obstruction pattern, slightly improved since prior study. Electronically Signed   By: Rolm Baptise M.D.   On: 06/25/2016 08:17   Ct Head Wo Contrast  Result Date: 06/24/2016 CLINICAL DATA:  Weakness, nausea, shortness of breath, diagnosed with pulmonary embolism on 06/19/2016, headache, history melanoma, multiple myeloma, lung cancer EXAM: CT HEAD WITHOUT CONTRAST TECHNIQUE: Contiguous  axial images were obtained from the base of the skull through the vertex without intravenous contrast. COMPARISON:  06/27/2016 FINDINGS: Brain: Generalized atrophy. Normal ventricular morphology. No midline shift or mass effect. Small vessel chronic ischemic changes of deep cerebral white matter. Previously identified metastases within the RIGHT cerebellum and RIGHT occipital lobe on prior contrast-enhanced CT and prior MR brain not identified by this noncontrast exam. No intracranial hemorrhage, visualized mass lesion, evidence acute infarction, or extra-axial fluid collection. Vascular: Unremarkable Skull: Intact Sinuses/Orbits: Probable small osteoma of the LEFT frontal sinus. Remaining paranasal sinuses and mastoid air cells clear. Other: N/A IMPRESSION: Atrophy with small vessel chronic ischemic changes of deep cerebral white matter. No acute intracranial abnormalities. Enhancing lesions within the RIGHT cerebellum and RIGHT occipital lobe consistent with metastases as identified on the previous exam are not identified by the current noncontrast study. Electronically Signed   By: Lavonia Dana M.D.   On: 06/24/2016 17:54   Ct Angio Chest Pe W And/or Wo Contrast  Result Date: 06/24/2016 CLINICAL DATA:  Shortness of breath. Recently diagnosed with pulmonary embolism. Taking Eliquis. Right chest pain. Stage IV metastatic small cell lung cancer. Multiple myeloma. EXAM: CT ANGIOGRAPHY CHEST CT ABDOMEN AND PELVIS WITH CONTRAST TECHNIQUE: Multidetector CT imaging of the chest was performed using the standard protocol during bolus administration of intravenous contrast. Multiplanar CT image reconstructions and MIPs were obtained to evaluate the vascular anatomy. Multidetector CT imaging of the abdomen and pelvis was performed using the standard protocol during bolus administration of intravenous contrast. CONTRAST:  100 cc Isovue 370 COMPARISON:  Chest CTA dated 06/19/2016 and abdomen and pelvis CT dated 06/10/2016.  FINDINGS: CTA CHEST FINDINGS Cardiovascular: Mildly progressive subsegmental pulmonary embolus in the right lower lobe. The remainder of the previously demonstrated segmental and subsegmental right lower lobe and right upper lobe pulmonary emboli have not changed significantly. No definite left lower lobe pulmonary emboli are seen today. Mediastinum/Nodes: The previously demonstrated hypodense left lobe thyroid nodule is not clearly visible today. The previously demonstrated 1.5 cm short axis right peritracheal node has a short axis diameter of 1.5 cm on image number 30 of series 4. The previously demonstrated 1.2 cm short axis subcarinal node right has a short axis diameter of 1.4 cm on image number 38 of series 4. The previously  demonstrated 1.7 cm short axis diameter right hilar node has a short axis diameter of 1.8 cm on image number 38 of series 4. A previously demonstrated 1.2 cm short axis left hilar node has a short axis diameter of 1.3 cm on image number 40 of series 4. Lungs/Pleura: No significant change in a small right pleural effusion. The previously demonstrated 1.5 x 1.2 cm irregular central right upper lobe nodule 1 measures 1.4 x 1.0 cm on image number 33 of series 12. The previously demonstrated 3 mm right middle lobe nodule measures 6 mm on image number 38 of series 12. Mildly progressive consolidated right middle lobe atelectasis. Progressive patchy interstitial opacities in both upper lobes. Mild right lower lobe atelectasis with improvement. Musculoskeletal: Thoracic spine degenerative changes. Review of the MIP images confirms the above findings. CT ABDOMEN and PELVIS FINDINGS Hepatobiliary: No focal liver abnormality is seen. No gallstones, gallbladder wall thickening, or biliary dilatation. A small gallbladder fundal nodule with enhancement is again demonstrated. Pancreas: Diffusely atrophied. Spleen: Normal in size without focal abnormality. Adrenals/Urinary Tract: Stable left renal  cortical scar. Normal appearing adrenal glands and right kidney. Distended urinary bladder with mildly dilated ureters. No hydronephrosis. Stomach/Bowel: Interval multiple dilated proximal small bowel loops with normal caliber distal small bowel loops. Unremarkable stomach and colon. No evidence of appendicitis. Vascular/Lymphatic: Mild atheromatous arterial calcifications, including the abdominal aorta. No enlarged lymph nodes. Reproductive: Minimally enlarged prostate gland. Other: Tiny amount of free peritoneal fluid in the posterior pelvis on the right. Musculoskeletal: Lumbar spine degenerative changes. Review of the MIP images confirms the above findings. IMPRESSION: 1. Mildly progressive right lower lobe pulmonary emboli. 2. Stable central right upper lobe pulmonary emboli. 3. Interval obstruction of the mid small bowel. The cause of obstruction is not identified. This is most likely due to an adhesion. 4. Stable small enhancing mass in the gallbladder fundus. 5. Progressive patchy interstitial opacities in both lungs. This could be due to infection, drug allergy, or patchy interstitial edema. 6. Stable small right pleural effusion with decreased right lower lobe atelectasis. 7. Little change in 2 right lung nodules. 8. Little change in mediastinal and bilateral hilar adenopathy. 9. Mild aortic atherosclerosis. Electronically Signed   By: Claudie Revering M.D.   On: 06/24/2016 18:30   Ct Abdomen Pelvis W Contrast  Result Date: 06/24/2016 CLINICAL DATA:  Shortness of breath. Recently diagnosed with pulmonary embolism. Taking Eliquis. Right chest pain. Stage IV metastatic small cell lung cancer. Multiple myeloma. EXAM: CT ANGIOGRAPHY CHEST CT ABDOMEN AND PELVIS WITH CONTRAST TECHNIQUE: Multidetector CT imaging of the chest was performed using the standard protocol during bolus administration of intravenous contrast. Multiplanar CT image reconstructions and MIPs were obtained to evaluate the vascular anatomy.  Multidetector CT imaging of the abdomen and pelvis was performed using the standard protocol during bolus administration of intravenous contrast. CONTRAST:  100 cc Isovue 370 COMPARISON:  Chest CTA dated 06/19/2016 and abdomen and pelvis CT dated 06/10/2016. FINDINGS: CTA CHEST FINDINGS Cardiovascular: Mildly progressive subsegmental pulmonary embolus in the right lower lobe. The remainder of the previously demonstrated segmental and subsegmental right lower lobe and right upper lobe pulmonary emboli have not changed significantly. No definite left lower lobe pulmonary emboli are seen today. Mediastinum/Nodes: The previously demonstrated hypodense left lobe thyroid nodule is not clearly visible today. The previously demonstrated 1.5 cm short axis right peritracheal node has a short axis diameter of 1.5 cm on image number 30 of series 4. The previously demonstrated 1.2 cm short axis  subcarinal node right has a short axis diameter of 1.4 cm on image number 38 of series 4. The previously demonstrated 1.7 cm short axis diameter right hilar node has a short axis diameter of 1.8 cm on image number 38 of series 4. A previously demonstrated 1.2 cm short axis left hilar node has a short axis diameter of 1.3 cm on image number 40 of series 4. Lungs/Pleura: No significant change in a small right pleural effusion. The previously demonstrated 1.5 x 1.2 cm irregular central right upper lobe nodule 1 measures 1.4 x 1.0 cm on image number 33 of series 12. The previously demonstrated 3 mm right middle lobe nodule measures 6 mm on image number 38 of series 12. Mildly progressive consolidated right middle lobe atelectasis. Progressive patchy interstitial opacities in both upper lobes. Mild right lower lobe atelectasis with improvement. Musculoskeletal: Thoracic spine degenerative changes. Review of the MIP images confirms the above findings. CT ABDOMEN and PELVIS FINDINGS Hepatobiliary: No focal liver abnormality is seen. No  gallstones, gallbladder wall thickening, or biliary dilatation. A small gallbladder fundal nodule with enhancement is again demonstrated. Pancreas: Diffusely atrophied. Spleen: Normal in size without focal abnormality. Adrenals/Urinary Tract: Stable left renal cortical scar. Normal appearing adrenal glands and right kidney. Distended urinary bladder with mildly dilated ureters. No hydronephrosis. Stomach/Bowel: Interval multiple dilated proximal small bowel loops with normal caliber distal small bowel loops. Unremarkable stomach and colon. No evidence of appendicitis. Vascular/Lymphatic: Mild atheromatous arterial calcifications, including the abdominal aorta. No enlarged lymph nodes. Reproductive: Minimally enlarged prostate gland. Other: Tiny amount of free peritoneal fluid in the posterior pelvis on the right. Musculoskeletal: Lumbar spine degenerative changes. Review of the MIP images confirms the above findings. IMPRESSION: 1. Mildly progressive right lower lobe pulmonary emboli. 2. Stable central right upper lobe pulmonary emboli. 3. Interval obstruction of the mid small bowel. The cause of obstruction is not identified. This is most likely due to an adhesion. 4. Stable small enhancing mass in the gallbladder fundus. 5. Progressive patchy interstitial opacities in both lungs. This could be due to infection, drug allergy, or patchy interstitial edema. 6. Stable small right pleural effusion with decreased right lower lobe atelectasis. 7. Little change in 2 right lung nodules. 8. Little change in mediastinal and bilateral hilar adenopathy. 9. Mild aortic atherosclerosis. Electronically Signed   By: Claudie Revering M.D.   On: 06/24/2016 18:30   Dg Abd Portable 1v  Result Date: 06/25/2016 CLINICAL DATA:  75 year old male with small bowel obstruction status post NG tube placement. EXAM: PORTABLE ABDOMEN - 1 VIEW COMPARISON:  Abdominal CT dated 06/24/2016 FINDINGS: An enteric tube is partially visualized with side  port and tip over the epigastric area likely in the proximal stomach. Recommend advancing the tube by approximately 4 cm for optimal positioning. Multiple dilated air-filled loops of small bowel again noted measuring up to 5 cm in diameter. No free air or radiopaque calculi identified. There is osteopenia with advanced degenerative changes of the spine. No acute fracture. IMPRESSION: Enteric tube with tip and side-port likely in the proximal stomach. Persistent dilated small bowel loops.  Follow-up recommended. Electronically Signed   By: Anner Crete M.D.   On: 06/25/2016 02:03    Scheduled Meds: . cholecalciferol  1,000 Units Oral QHS  . dexamethasone  2 mg Intravenous Q12H  . pantoprazole (PROTONIX) IV  40 mg Intravenous Q12H  . sodium chloride flush  3 mL Intravenous Q12H   Continuous Infusions: . sodium chloride 100 mL/hr at 06/25/16 0935  .  argatroban 0.68 mcg/kg/min (06/25/16 5284)    Principal Problem:   SBO (small bowel obstruction) Active Problems:   Hyponatremia   History of pulmonary embolism   Acute deep vein thrombosis (DVT) of tibial vein of right lower extremity (HCC)   Headache   Long term current use of anticoagulant therapy   Brain metastases (HCC)   Adenocarcinoma of right lung, stage 4 (HCC)   Hypokalemia   Thrombocytopenia (HCC)   Malnutrition of moderate degree   GERD (gastroesophageal reflux disease)   Chronic diastolic CHF (congestive heart failure) ([place])   GIB (gastrointestinal bleeding)    Time spent: 30 minutes    Barton Dubois  [facility] Pager [phone]. If 7PM-7AM, please contact night-coverage at [email or web address], password [facility] 06/25/2016, 9:42 AM  LOS: 1 day

## 2016-06-25 NOTE — Progress Notes (Signed)
ANTICOAGULATION CONSULT NOTE - Brief Note  Pharmacy Consult for Argatroban Indication: pulmonary embolus  APTT follow-up  Today, 12/16  0800 aPTT = 54 sec (at lower end goal). RN does report petechia on legs- but had PTA.  This was a peripheral stick unlike previous result when aPTT = 48sec  1100 aPTT = 55 sec  CBC: Hgb stable, pltc decreased but stable ? + FOBT but no obvious bleeding noted  Note - 10kg weight difference from weight on recent admission so although wt based dose decreased, rate actually increased  Likelihood of HIT in this patient is very low as HIT ab was just slightly positive and SRA was negative  Goal of Therapy:  aPTT 50-70 seconds (per Dr. Blaine Hamper, would like to target lower end of goal range secondary to low Hgb, FOBT + on admission) Monitor platelets by anticoagulation protocol: Yes   Plan:   APTT therapeutic x 2, continue 4 m//hr (0.68 mcg/kg/min) ( targeting lower end of goal range per MD request).   Check aPTT daily  CBC at least daily while on argatroban (currently ordered q6h per MD).   Monitor closely for s/sx of bleeding.  Doreene Eland, PharmD, BCPS.   Pager: [phone] 06/25/2016 11:33 AM

## 2016-06-26 DIAGNOSIS — K219 Gastro-esophageal reflux disease without esophagitis: Secondary | ICD-10-CM

## 2016-06-26 DIAGNOSIS — E232 Diabetes insipidus: Secondary | ICD-10-CM

## 2016-06-26 DIAGNOSIS — N400 Enlarged prostate without lower urinary tract symptoms: Secondary | ICD-10-CM

## 2016-06-26 DIAGNOSIS — E870 Hyperosmolality and hypernatremia: Secondary | ICD-10-CM

## 2016-06-26 LAB — CBC
HCT: 29 % — ABNORMAL LOW (ref 39.0–52.0)
Hemoglobin: 9.8 g/dL — ABNORMAL LOW (ref 13.0–17.0)
MCH: 33.2 pg (ref 26.0–34.0)
MCHC: 33.8 g/dL (ref 30.0–36.0)
MCV: 98.3 fL (ref 78.0–100.0)
PLATELETS: 111 10*3/uL — AB (ref 150–400)
RBC: 2.95 MIL/uL — AB (ref 4.22–5.81)
RDW: 18.6 % — ABNORMAL HIGH (ref 11.5–15.5)
WBC: 3.5 10*3/uL — ABNORMAL LOW (ref 4.0–10.5)

## 2016-06-26 LAB — BASIC METABOLIC PANEL
Anion gap: 8 (ref 5–15)
BUN: 10 mg/dL (ref 6–20)
CALCIUM: 9.5 mg/dL (ref 8.9–10.3)
CO2: 27 mmol/L (ref 22–32)
CREATININE: 1.06 mg/dL (ref 0.61–1.24)
Chloride: 116 mmol/L — ABNORMAL HIGH (ref 101–111)
GFR calc Af Amer: >60 mL/min (ref >60)
Glucose, Bld: 109 mg/dL — ABNORMAL HIGH (ref 65–99)
Potassium: 3.6 mmol/L (ref 3.5–5.1)
SODIUM: 151 mmol/L — AB (ref 135–145)

## 2016-06-26 LAB — APTT
APTT: 38 s — AB (ref 24–36)
APTT: 39 s — AB (ref 24–36)
APTT: 53 s — AB (ref 24–36)
aPTT: 35 seconds (ref 24–36)
aPTT: 44 seconds — ABNORMAL HIGH (ref 24–36)
aPTT: 43 seconds — ABNORMAL HIGH (ref 24–36)

## 2016-06-26 LAB — GLUCOSE, CAPILLARY: Glucose-Capillary: 117 mg/dL — ABNORMAL HIGH (ref 65–99)

## 2016-06-26 MED ORDER — ARGATROBAN 50 MG/50ML IV SOLN
1.3000 ug/kg/min | INTRAVENOUS | Status: DC
  Administered 2016-06-26: 1.3 ug/kg/min via INTRAVENOUS
  Filled 2016-06-26 (×2): qty 50

## 2016-06-26 MED ORDER — ARGATROBAN 50 MG/50ML IV SOLN
0.7600 ug/kg/min | INTRAVENOUS | Status: DC
  Administered 2016-06-26 (×2): 0.76 ug/kg/min via INTRAVENOUS
  Filled 2016-06-26 (×2): qty 50

## 2016-06-26 MED ORDER — TEMAZEPAM 15 MG PO CAPS
15.0000 mg | ORAL_CAPSULE | Freq: Every evening | ORAL | Status: DC | PRN
  Administered 2016-06-26 – 2016-07-09 (×10): 15 mg via ORAL
  Filled 2016-06-26 (×11): qty 1

## 2016-06-26 MED ORDER — DEXTROSE 5 % IV SOLN
INTRAVENOUS | Status: DC
  Administered 2016-06-26: 23:00:00 via INTRAVENOUS
  Administered 2016-06-26: 1000 mL via INTRAVENOUS
  Administered 2016-06-27 – 2016-06-29 (×4): via INTRAVENOUS

## 2016-06-26 MED ORDER — ARGATROBAN 50 MG/50ML IV SOLN
1.3000 ug/kg/min | INTRAVENOUS | Status: DC
  Filled 2016-06-26: qty 50

## 2016-06-26 MED ORDER — ARGATROBAN 50 MG/50ML IV SOLN
2.0000 ug/kg/min | INTRAVENOUS | Status: DC
  Administered 2016-06-26 – 2016-06-28 (×10): 2 ug/kg/min via INTRAVENOUS
  Filled 2016-06-26 (×10): qty 50

## 2016-06-26 NOTE — Progress Notes (Signed)
ANTICOAGULATION CONSULT NOTE - f/u Consult  Pharmacy Consult for Argatroban Indication: pulmonary embolus  Allergies  Allergen Reactions  . Tramadol Palpitations    "Made my chest feel tight"  . Amoxicillin     REACTION: rash  . Penicillins Rash    Has patient had a PCN reaction causing immediate rash, facial/tongue/throat swelling, SOB or lightheadedness with hypotension: unknown Has patient had a PCN reaction causing severe rash involving mucus membranes or skin necrosis: unknown Has patient had a PCN reaction that required hospitalization: no  Has patient had a PCN reaction occurring within the last 10 years: no  If all of the above answers are "NO", then may proceed with Cephalosporin use.     Patient Measurements: Height: '5\' 10"'$  (177.8 cm) Weight: 201 lb 11.5 oz (91.5 kg) IBW/kg (Calculated) : 73  Vital Signs: Temp: 98.1 F (36.7 C) (12/17 2106) Temp Source: Oral (12/17 2106) BP: 116/79 (12/17 2106) Pulse Rate: 100 (12/17 2106)  Labs:  Recent Labs  06/24/16 1622 06/24/16 2151  06/25/16 0357 06/25/16 0819  06/26/16 0434  06/26/16 1149 06/26/16 1537 06/26/16 1905 06/26/16 2315  HGB 9.2* 10.2*  --  8.7* 9.0*  --   --   --  9.8*  --   --   --   HCT 25.3* 28.8*  --  24.3* 25.6*  --   --   --  29.0*  --   --   --   PLT 95* 109*  --  86* 90*  --   --   --  111*  --   --   --   APTT  --  26  < > 48* 54*  < > 38*  < > 35 44* 43* 53*  LABPROT  --  14.7  --   --   --   --   --   --   --   --   --   --   INR  --  1.14  --   --   --   --   --   --   --   --   --   --   CREATININE 0.86 0.99  --  0.94 0.90  --  1.06  --   --   --   --   --   TROPONINI <0.03  --   --   --   --   --   --   --   --   --   --   --   < > = values in this interval not displayed.  Estimated Creatinine Clearance: 68.5 mL/min (by C-G formula based on SCr of 1.06 mg/dL).  Assessment: 36 yoM admitted on 12/15 with PE and pharmacy is consulted to dose argatroban IV.  PMH is significant for  metastatic lung cancer and chronic anticoagulation for history of PE/DVT.  Recent admission 11/26-12/4 for SBO.  During that admission, Xarelto was held on 11/26 for NPO status and in case of surgical intervention.  He was started on IV Heparin on 11/28, but during the course of heparin, his Hgb trended down and Pltc trended down (baseline Pltc 120-170's, Pltc 68 on discharge). Heparin infusion was active from 11/28-12/1, but was held for low platelets on 12/1. No bleeding or complications were noted.  HIT panel not ordered at that time.  Xarelto was resumed on 12/3 and he was discharged on 12/4. Patient was re-admitted on 12/10 and was placed on Argatroban drip for PE until  HIT ruled out. Heparin antibody came back slightly elevated, however SRA negative. Patient transitioned to Apixaban per Oncology recommendations and discharged on 12/13.   On 12/15, Patient presents with abdominal pain. CT scan shows recurrent SBO and mildly progressive RLL PE.   Last dose of Apixaban '10mg'$  PO PTA was 12/15 at 0800. FOBT +.   12/17  0430 aPTT = 38 sec (subtherapeutic)on 0.68 mcg/kg/min, rate increased  0843 aPTT = 39 sec (subtherapeutic) on 0.76 mcg/kg/min - very small change in result despite increase.  Pump infusing at correct rate via PAC  0949 Alaris using weight of 86.2kg (not able to pick this wt in [facility] during order modification), CHL using wt of 98.3kg which appears to be an outlier from recent weights (including recent admission).  asked RN to change Alaris to today's weight since its more in line with other weights and also able to select this weight during order entry in CHL.   11:49 aPTT = 35 sec (subttherapeutic) on 31mg/kg/min.  Level decreased despite ~20% dose increase.    15:37 aPTT = 44 sec (subtherapeutic but rising) on 1.3 mcg/kg/min  19:05 aPTT = 43 sec on 1.553m/kg/min  CBC: HGB better, pltc better  + FOBT but no obvious bleeding noted  Note - 10kg weight difference from weight on  recent admission so although wt based dose decreased, rate actually increased  Likelihood of HIT in this patient is low as HIT ab was just slightly positive and SRA was negative. Continuing DTI d/t clinical picture (thrombocytopenia following heparin then new PE)  2315 aPTT=53 sec on 2 mcg/kg/min, no infusion problems or bleeding per RN  Goal of Therapy:  aPTT 50-70 seconds (per Dr. NiBlaine Hamperwould like to target lower end of goal range secondary to low Hgb, FOBT + on admission) Monitor platelets by anticoagulation protocol: Yes   Plan:   Continue argatroban at  2 mcg/kg/min (11 ml/hr) (targeting lower end of goal range per MD request).   Check CBC with next aPTT  Will recheck aPTT with am labs  CBC at least daily while on argatroban (currently ordered q6h per MD).   Monitor closely for s/sx of bleeding. GrLawana Pai  06/26/2016,11:44 PM.

## 2016-06-26 NOTE — Progress Notes (Signed)
ANTICOAGULATION CONSULT NOTE - f/u Consult  Pharmacy Consult for Argatroban Indication: pulmonary embolus  Allergies  Allergen Reactions  . Tramadol Palpitations    "Made my chest feel tight"  . Amoxicillin     REACTION: rash  . Penicillins Rash    Has patient had a PCN reaction causing immediate rash, facial/tongue/throat swelling, SOB or lightheadedness with hypotension: unknown Has patient had a PCN reaction causing severe rash involving mucus membranes or skin necrosis: unknown Has patient had a PCN reaction that required hospitalization: no  Has patient had a PCN reaction occurring within the last 10 years: no  If all of the above answers are "NO", then may proceed with Cephalosporin use.     Patient Measurements: Height: '5\' 10"'$  (177.8 cm) Weight: 201 lb 11.5 oz (91.5 kg) IBW/kg (Calculated) : 73  Vital Signs: Temp: 97.9 F (36.6 C) (12/17 1543) Temp Source: Oral (12/17 1543) BP: 118/81 (12/17 1543) Pulse Rate: 93 (12/17 1543)  Labs:  Recent Labs  06/24/16 1622 06/24/16 2151  06/25/16 0357 06/25/16 0819  06/26/16 0434 06/26/16 0843 06/26/16 1149 06/26/16 1537  HGB 9.2* 10.2*  --  8.7* 9.0*  --   --   --  9.8*  --   HCT 25.3* 28.8*  --  24.3* 25.6*  --   --   --  29.0*  --   PLT 95* 109*  --  86* 90*  --   --   --  111*  --   APTT  --  26  < > 48* 54*  < > 38* 39* 35 44*  LABPROT  --  14.7  --   --   --   --   --   --   --   --   INR  --  1.14  --   --   --   --   --   --   --   --   CREATININE 0.86 0.99  --  0.94 0.90  --  1.06  --   --   --   TROPONINI <0.03  --   --   --   --   --   --   --   --   --   < > = values in this interval not displayed.  Estimated Creatinine Clearance: 68.5 mL/min (by C-G formula based on SCr of 1.06 mg/dL).  Assessment: 42 yoM admitted on 12/15 with PE and pharmacy is consulted to dose argatroban IV.  PMH is significant for metastatic lung cancer and chronic anticoagulation for history of PE/DVT.  Recent admission  11/26-12/4 for SBO.  During that admission, Xarelto was held on 11/26 for NPO status and in case of surgical intervention.  He was started on IV Heparin on 11/28, but during the course of heparin, his Hgb trended down and Pltc trended down (baseline Pltc 120-170's, Pltc 68 on discharge). Heparin infusion was active from 11/28-12/1, but was held for low platelets on 12/1. No bleeding or complications were noted.  HIT panel not ordered at that time.  Xarelto was resumed on 12/3 and he was discharged on 12/4. Patient was re-admitted on 12/10 and was placed on Argatroban drip for PE until HIT ruled out. Heparin antibody came back slightly elevated, however SRA negative. Patient transitioned to Apixaban per Oncology recommendations and discharged on 12/13.   On 12/15, Patient presents with abdominal pain. CT scan shows recurrent SBO and mildly progressive RLL PE.   Last dose of Apixaban '10mg'$   PO PTA was 12/15 at 0800. FOBT +.   Today, 12/16  0430 aPTT = 38 sec (subtherapeutic)on 0.68 mcg/kg/min, rate increased  0843 aPTT = 39 sec (subtherapeutic) on 0.76 mcg/kg/min - very small change in result despite increase.  Pump infusing at correct rate via PAC  0949 Alaris using weight of 86.2kg (not able to pick this wt in [facility] during order modification), CHL using wt of 98.3kg which appears to be an outlier from recent weights (including recent admission).  asked RN to change Alaris to today's weight since its more in line with other weights and also able to select this weight during order entry in CH[facility].   11:49 aPTT = 35 sec (subttherapeutic) on 61mg/kg/min.  Level decreased despite ~20% dose increase.    15:37 aPTT = 44 sec (subtherapeutic but rising) on 1.3 mcg/kg/min  CBC: HGB better, pltc better  + FOBT but no obvious bleeding noted  Note - 10kg weight difference from weight on recent admission so although wt based dose decreased, rate actually increased  Likelihood of HIT in this patient is low as HIT  ab was just slightly positive and SRA was negative. Continuing DTI d/t clinical picture (thrombocytopenia following heparin then new PE)  Goal of Therapy:  aPTT 50-70 seconds (per Dr. NBlaine Hamper would like to target lower end of goal range secondary to low Hgb, FOBT + on admission) Monitor platelets by anticoagulation protocol: Yes   Plan:   APTT subtherapeutic, increase to 1.5 mcg/kg/min (8.2 ml/hr) ( targeting lower end of goal range per MD request).   No issues with infusion.  Does not appear to be leaking anywhere and RN looked at port and everything connected/infusing  Check CBC with next aPTT  Check aPTT every 2 hours until two therapeutic levels obtained, then daily thereafter.   CBC at least daily while on argatroban (currently ordered q6h per MD).   Monitor closely for s/sx of bleeding.   JAdrian Saran PharmD, BCPS Pager [phone]/17/2017 4:45 PM

## 2016-06-26 NOTE — Progress Notes (Addendum)
ANTICOAGULATION CONSULT NOTE - f/u Consult  Pharmacy Consult for Argatroban Indication: pulmonary embolus  Allergies  Allergen Reactions  . Tramadol Palpitations    "Made my chest feel tight"  . Amoxicillin     REACTION: rash  . Penicillins Rash    Has patient had a PCN reaction causing immediate rash, facial/tongue/throat swelling, SOB or lightheadedness with hypotension: unknown Has patient had a PCN reaction causing severe rash involving mucus membranes or skin necrosis: unknown Has patient had a PCN reaction that required hospitalization: no  Has patient had a PCN reaction occurring within the last 10 years: no  If all of the above answers are "NO", then may proceed with Cephalosporin use.     Patient Measurements: Height: '5\' 10"'$  (177.8 cm) Weight: 201 lb 11.5 oz (91.5 kg) IBW/kg (Calculated) : 73  Vital Signs: Temp: 99.6 F (37.6 C) (12/17 1200) Temp Source: Axillary (12/17 1200) BP: 124/76 (12/17 1200) Pulse Rate: 113 (12/17 1200)  Labs:  Recent Labs  06/24/16 1622 06/24/16 2151  06/25/16 0357 06/25/16 0819  06/26/16 0434 06/26/16 0843 06/26/16 1149  HGB 9.2* 10.2*  --  8.7* 9.0*  --   --   --  9.8*  HCT 25.3* 28.8*  --  24.3* 25.6*  --   --   --  29.0*  PLT 95* 109*  --  86* 90*  --   --   --  111*  APTT  --  26  < > 48* 54*  < > 38* 39* 35  LABPROT  --  14.7  --   --   --   --   --   --   --   INR  --  1.14  --   --   --   --   --   --   --   CREATININE 0.86 0.99  --  0.94 0.90  --  1.06  --   --   TROPONINI <0.03  --   --   --   --   --   --   --   --   < > = values in this interval not displayed.  Estimated Creatinine Clearance: 68.5 mL/min (by C-G formula based on SCr of 1.06 mg/dL).  Assessment: 25 yoM admitted on 12/15 with PE and pharmacy is consulted to dose argatroban IV.  PMH is significant for metastatic lung cancer and chronic anticoagulation for history of PE/DVT.  Recent admission 11/26-12/4 for SBO.  During that admission, Xarelto was  held on 11/26 for NPO status and in case of surgical intervention.  He was started on IV Heparin on 11/28, but during the course of heparin, his Hgb trended down and Pltc trended down (baseline Pltc 120-170's, Pltc 68 on discharge). Heparin infusion was active from 11/28-12/1, but was held for low platelets on 12/1. No bleeding or complications were noted.  HIT panel not ordered at that time.  Xarelto was resumed on 12/3 and he was discharged on 12/4. Patient was re-admitted on 12/10 and was placed on Argatroban drip for PE until HIT ruled out. Heparin antibody came back slightly elevated, however SRA negative. Patient transitioned to Apixaban per Oncology recommendations and discharged on 12/13.   On 12/15, Patient presents with abdominal pain. CT scan shows recurrent SBO and mildly progressive RLL PE.   Last dose of Apixaban '10mg'$  PO PTA was 12/15 at 0800. FOBT +.   Today, 12/16  0430 aPTT = 38 sec (subtherapeutic)on 0.68 mcg/kg/min, rate increased  0843 aPTT = 39 sec (subtherapeutic) on 0.76 mcg/kg/min - very small change in result despite increase.  Pump infusing at correct rate via PAC  0949 Alaris using weight of 86.2kg (not able to pick this wt in [facility] during order modification), CHL using wt of 98.3kg which appears to be an outlier from recent weights (including recent admission).  asked RN to change Alaris to today's weight since its more in line with other weights and also able to select this weight during order entry in CH[facility].   11:49 aPTT = 35 sec (subttherapeutic) on 18mg/kg/min.  Level decreased despite ~20% dose increase.    CBC: HGB better, pltc better  + FOBT but no obvious bleeding noted  Note - 10kg weight difference from weight on recent admission so although wt based dose decreased, rate actually increased  Likelihood of HIT in this patient is low as HIT ab was just slightly positive and SRA was negative. Continuing DTI d/t clinical picture (thrombocytopenia following heparin  then new PE)  Goal of Therapy:  aPTT 50-70 seconds (per Dr. NBlaine Hamper would like to target lower end of goal range secondary to low Hgb, FOBT + on admission) Monitor platelets by anticoagulation protocol: Yes   Plan:   APTT subtherapeutic, increase 5.5 m//hr (1 mcg/kg/min) ( targeting lower end of goal range per MD request).   No issues with infusion.  Does not appear to be leaking anywhere and RN looked at port and everything connected/infusing  Check CBC with next aPTT  Check aPTT every 2 hours until two therapeutic levels obtained, then daily thereafter.   CBC at least daily while on argatroban (currently ordered q6h per MD).   Monitor closely for s/sx of bleeding.  DDoreene Eland PharmD, BCPS.   Pager: [phone]/17/2017 12:38 PM

## 2016-06-26 NOTE — Progress Notes (Signed)
Subjective: Pt feels about the same   No flatus or BM   Objective: Vital signs in last 24 hours: Temp:  [97.8 F (36.6 C)-98.1 F (36.7 C)] 98 F (36.7 C) (12/17 0400) Pulse Rate:  [88-116] 92 (12/17 0600) Resp:  [11-24] 14 (12/17 0600) BP: (91-134)/(56-91) 122/69 (12/17 0600) SpO2:  [93 %-97 %] 96 % (12/17 0600) Weight:  [91.5 kg (201 lb 11.5 oz)] 91.5 kg (201 lb 11.5 oz) (12/17 0500) Last BM Date: 06/23/16  Intake/Output from previous day: 12/16 0701 - 12/17 0700 In: 3096 [P.O.:600; I.V.:2496] Out: 5100 [Urine:3650; Emesis/NG output:1450] Intake/Output this shift: No intake/output data recorded.  GI: distended BS present not very tender no peritonitis   Lab Results:   Recent Labs  06/25/16 0357 06/25/16 0819  WBC 4.3 4.3  HGB 8.7* 9.0*  HCT 24.3* 25.6*  PLT 86* 90*   BMET  Recent Labs  06/25/16 0819 06/26/16 0434  NA 136 151*  K 4.0 3.6  CL 104 116*  CO2 26 27  GLUCOSE 132* 109*  BUN 9 10  CREATININE 0.90 1.06  CALCIUM 9.0 9.5   PT/INR  Recent Labs  06/24/16 2151  LABPROT 14.7  INR 1.14   ABG No results for input(s): PHART, HCO3 in the last 72 hours.  Invalid input(s): PCO2, PO2  Studies/Results: Dg Chest 2 View  Result Date: 06/24/2016 CLINICAL DATA:  Weakness and shortness of Breath EXAM: CHEST  2 VIEW COMPARISON:  06/19/2016 FINDINGS: Cardiac shadow is enlarged. Right chest wall port is again seen. Patchy infiltrates are again noted in the right lung. Improved aeration in the left base is noted. No new focal abnormality is seen. No bony abnormality is noted. IMPRESSION: Improved aeration on the left with stable infiltrates on the right. Electronically Signed   By: Inez Catalina M.D.   On: 06/24/2016 16:38   Dg Abd 1 View  Result Date: 06/25/2016 CLINICAL DATA:  Small bowel obstruction EXAM: ABDOMEN - 1 VIEW COMPARISON:  06/25/2016 FINDINGS: Dilated small bowel loops noted within the abdomen compatible with small bowel obstruction.  There may have been slight interval improvement in degree of distention. NG tube tip is in the proximal stomach with the side port near the GE junction. IMPRESSION: Small bowel obstruction pattern, slightly improved since prior study. Electronically Signed   By: Rolm Baptise M.D.   On: 06/25/2016 08:17   Ct Head Wo Contrast  Result Date: 06/24/2016 CLINICAL DATA:  Weakness, nausea, shortness of breath, diagnosed with pulmonary embolism on 06/19/2016, headache, history melanoma, multiple myeloma, lung cancer EXAM: CT HEAD WITHOUT CONTRAST TECHNIQUE: Contiguous axial images were obtained from the base of the skull through the vertex without intravenous contrast. COMPARISON:  06/27/2016 FINDINGS: Brain: Generalized atrophy. Normal ventricular morphology. No midline shift or mass effect. Small vessel chronic ischemic changes of deep cerebral white matter. Previously identified metastases within the RIGHT cerebellum and RIGHT occipital lobe on prior contrast-enhanced CT and prior MR brain not identified by this noncontrast exam. No intracranial hemorrhage, visualized mass lesion, evidence acute infarction, or extra-axial fluid collection. Vascular: Unremarkable Skull: Intact Sinuses/Orbits: Probable small osteoma of the LEFT frontal sinus. Remaining paranasal sinuses and mastoid air cells clear. Other: N/A IMPRESSION: Atrophy with small vessel chronic ischemic changes of deep cerebral white matter. No acute intracranial abnormalities. Enhancing lesions within the RIGHT cerebellum and RIGHT occipital lobe consistent with metastases as identified on the previous exam are not identified by the current noncontrast study. Electronically Signed   By: Lavonia Dana  M.D.   On: 06/24/2016 17:54   Ct Angio Chest Pe W And/or Wo Contrast  Result Date: 06/24/2016 CLINICAL DATA:  Shortness of breath. Recently diagnosed with pulmonary embolism. Taking Eliquis. Right chest pain. Stage IV metastatic small cell lung cancer.  Multiple myeloma. EXAM: CT ANGIOGRAPHY CHEST CT ABDOMEN AND PELVIS WITH CONTRAST TECHNIQUE: Multidetector CT imaging of the chest was performed using the standard protocol during bolus administration of intravenous contrast. Multiplanar CT image reconstructions and MIPs were obtained to evaluate the vascular anatomy. Multidetector CT imaging of the abdomen and pelvis was performed using the standard protocol during bolus administration of intravenous contrast. CONTRAST:  100 cc Isovue 370 COMPARISON:  Chest CTA dated 06/19/2016 and abdomen and pelvis CT dated 06/10/2016. FINDINGS: CTA CHEST FINDINGS Cardiovascular: Mildly progressive subsegmental pulmonary embolus in the right lower lobe. The remainder of the previously demonstrated segmental and subsegmental right lower lobe and right upper lobe pulmonary emboli have not changed significantly. No definite left lower lobe pulmonary emboli are seen today. Mediastinum/Nodes: The previously demonstrated hypodense left lobe thyroid nodule is not clearly visible today. The previously demonstrated 1.5 cm short axis right peritracheal node has a short axis diameter of 1.5 cm on image number 30 of series 4. The previously demonstrated 1.2 cm short axis subcarinal node right has a short axis diameter of 1.4 cm on image number 38 of series 4. The previously demonstrated 1.7 cm short axis diameter right hilar node has a short axis diameter of 1.8 cm on image number 38 of series 4. A previously demonstrated 1.2 cm short axis left hilar node has a short axis diameter of 1.3 cm on image number 40 of series 4. Lungs/Pleura: No significant change in a small right pleural effusion. The previously demonstrated 1.5 x 1.2 cm irregular central right upper lobe nodule 1 measures 1.4 x 1.0 cm on image number 33 of series 12. The previously demonstrated 3 mm right middle lobe nodule measures 6 mm on image number 38 of series 12. Mildly progressive consolidated right middle lobe  atelectasis. Progressive patchy interstitial opacities in both upper lobes. Mild right lower lobe atelectasis with improvement. Musculoskeletal: Thoracic spine degenerative changes. Review of the MIP images confirms the above findings. CT ABDOMEN and PELVIS FINDINGS Hepatobiliary: No focal liver abnormality is seen. No gallstones, gallbladder wall thickening, or biliary dilatation. A small gallbladder fundal nodule with enhancement is again demonstrated. Pancreas: Diffusely atrophied. Spleen: Normal in size without focal abnormality. Adrenals/Urinary Tract: Stable left renal cortical scar. Normal appearing adrenal glands and right kidney. Distended urinary bladder with mildly dilated ureters. No hydronephrosis. Stomach/Bowel: Interval multiple dilated proximal small bowel loops with normal caliber distal small bowel loops. Unremarkable stomach and colon. No evidence of appendicitis. Vascular/Lymphatic: Mild atheromatous arterial calcifications, including the abdominal aorta. No enlarged lymph nodes. Reproductive: Minimally enlarged prostate gland. Other: Tiny amount of free peritoneal fluid in the posterior pelvis on the right. Musculoskeletal: Lumbar spine degenerative changes. Review of the MIP images confirms the above findings. IMPRESSION: 1. Mildly progressive right lower lobe pulmonary emboli. 2. Stable central right upper lobe pulmonary emboli. 3. Interval obstruction of the mid small bowel. The cause of obstruction is not identified. This is most likely due to an adhesion. 4. Stable small enhancing mass in the gallbladder fundus. 5. Progressive patchy interstitial opacities in both lungs. This could be due to infection, drug allergy, or patchy interstitial edema. 6. Stable small right pleural effusion with decreased right lower lobe atelectasis. 7. Little change in 2 right  lung nodules. 8. Little change in mediastinal and bilateral hilar adenopathy. 9. Mild aortic atherosclerosis. Electronically Signed   By:  Claudie Revering M.D.   On: 06/24/2016 18:30   Ct Abdomen Pelvis W Contrast  Result Date: 06/24/2016 CLINICAL DATA:  Shortness of breath. Recently diagnosed with pulmonary embolism. Taking Eliquis. Right chest pain. Stage IV metastatic small cell lung cancer. Multiple myeloma. EXAM: CT ANGIOGRAPHY CHEST CT ABDOMEN AND PELVIS WITH CONTRAST TECHNIQUE: Multidetector CT imaging of the chest was performed using the standard protocol during bolus administration of intravenous contrast. Multiplanar CT image reconstructions and MIPs were obtained to evaluate the vascular anatomy. Multidetector CT imaging of the abdomen and pelvis was performed using the standard protocol during bolus administration of intravenous contrast. CONTRAST:  100 cc Isovue 370 COMPARISON:  Chest CTA dated 06/19/2016 and abdomen and pelvis CT dated 06/10/2016. FINDINGS: CTA CHEST FINDINGS Cardiovascular: Mildly progressive subsegmental pulmonary embolus in the right lower lobe. The remainder of the previously demonstrated segmental and subsegmental right lower lobe and right upper lobe pulmonary emboli have not changed significantly. No definite left lower lobe pulmonary emboli are seen today. Mediastinum/Nodes: The previously demonstrated hypodense left lobe thyroid nodule is not clearly visible today. The previously demonstrated 1.5 cm short axis right peritracheal node has a short axis diameter of 1.5 cm on image number 30 of series 4. The previously demonstrated 1.2 cm short axis subcarinal node right has a short axis diameter of 1.4 cm on image number 38 of series 4. The previously demonstrated 1.7 cm short axis diameter right hilar node has a short axis diameter of 1.8 cm on image number 38 of series 4. A previously demonstrated 1.2 cm short axis left hilar node has a short axis diameter of 1.3 cm on image number 40 of series 4. Lungs/Pleura: No significant change in a small right pleural effusion. The previously demonstrated 1.5 x 1.2 cm  irregular central right upper lobe nodule 1 measures 1.4 x 1.0 cm on image number 33 of series 12. The previously demonstrated 3 mm right middle lobe nodule measures 6 mm on image number 38 of series 12. Mildly progressive consolidated right middle lobe atelectasis. Progressive patchy interstitial opacities in both upper lobes. Mild right lower lobe atelectasis with improvement. Musculoskeletal: Thoracic spine degenerative changes. Review of the MIP images confirms the above findings. CT ABDOMEN and PELVIS FINDINGS Hepatobiliary: No focal liver abnormality is seen. No gallstones, gallbladder wall thickening, or biliary dilatation. A small gallbladder fundal nodule with enhancement is again demonstrated. Pancreas: Diffusely atrophied. Spleen: Normal in size without focal abnormality. Adrenals/Urinary Tract: Stable left renal cortical scar. Normal appearing adrenal glands and right kidney. Distended urinary bladder with mildly dilated ureters. No hydronephrosis. Stomach/Bowel: Interval multiple dilated proximal small bowel loops with normal caliber distal small bowel loops. Unremarkable stomach and colon. No evidence of appendicitis. Vascular/Lymphatic: Mild atheromatous arterial calcifications, including the abdominal aorta. No enlarged lymph nodes. Reproductive: Minimally enlarged prostate gland. Other: Tiny amount of free peritoneal fluid in the posterior pelvis on the right. Musculoskeletal: Lumbar spine degenerative changes. Review of the MIP images confirms the above findings. IMPRESSION: 1. Mildly progressive right lower lobe pulmonary emboli. 2. Stable central right upper lobe pulmonary emboli. 3. Interval obstruction of the mid small bowel. The cause of obstruction is not identified. This is most likely due to an adhesion. 4. Stable small enhancing mass in the gallbladder fundus. 5. Progressive patchy interstitial opacities in both lungs. This could be due to infection, drug allergy, or patchy  interstitial  edema. 6. Stable small right pleural effusion with decreased right lower lobe atelectasis. 7. Little change in 2 right lung nodules. 8. Little change in mediastinal and bilateral hilar adenopathy. 9. Mild aortic atherosclerosis. Electronically Signed   By: Claudie Revering M.D.   On: 06/24/2016 18:30   Dg Abd Portable 1v-small Bowel Obstruction Protocol-initial, 8 Hr Delay  Result Date: 06/25/2016 CLINICAL DATA:  Small bowel obstruction. EXAM: PORTABLE ABDOMEN - 1 VIEW COMPARISON:  June 25, 2016 FINDINGS: The side port of the NG tube is near the GE junction, unchanged. Continued mild small bowel obstruction. IMPRESSION: Continued small bowel obstruction. The side port of the NG tube is near the GE junction, unchanged. Electronically Signed   By: Dorise Bullion III M.D   On: 06/25/2016 17:43   Dg Abd Portable 1v  Result Date: 06/25/2016 CLINICAL DATA:  75 year old male with small bowel obstruction status post NG tube placement. EXAM: PORTABLE ABDOMEN - 1 VIEW COMPARISON:  Abdominal CT dated 06/24/2016 FINDINGS: An enteric tube is partially visualized with side port and tip over the epigastric area likely in the proximal stomach. Recommend advancing the tube by approximately 4 cm for optimal positioning. Multiple dilated air-filled loops of small bowel again noted measuring up to 5 cm in diameter. No free air or radiopaque calculi identified. There is osteopenia with advanced degenerative changes of the spine. No acute fracture. IMPRESSION: Enteric tube with tip and side-port likely in the proximal stomach. Persistent dilated small bowel loops.  Follow-up recommended. Electronically Signed   By: Anner Crete M.D.   On: 06/25/2016 02:03    Anti-infectives: Anti-infectives    Start     Dose/Rate Route Frequency Ordered Stop   06/25/16 1000  levofloxacin (LEVAQUIN) tablet 500 mg  Status:  Discontinued     500 mg Oral Daily 06/24/16 2026 06/25/16 0941      Assessment/Plan: Patient Active Problem  List   Diagnosis Date Noted  . GERD (gastroesophageal reflux disease) 06/24/2016  . Chronic diastolic CHF (congestive heart failure) ([place]) 06/24/2016  . GIB (gastrointestinal bleeding) 06/24/2016  . Malnutrition of moderate degree 06/20/2016  . Malignant neoplasm of upper lobe of right lung ([place])   . Pulmonary emboli ([place]) 06/19/2016  . Pulmonary embolus ([place]) 06/19/2016  . Abnormal CT scan, small bowel   . Encephalopathy, metabolic   . Thrombocytopenia ([place])   . Diabetes insipidus secondary to vasopressin deficiency ([place])   . Hypernatremia   . Small bowel obstruction 06/06/2016  . Hypokalemia 06/06/2016  . Adenocarcinoma of right lung, stage 4 ([place]) 05/27/2016  . Nausea with vomiting 05/18/2016  . Dehydration 05/18/2016  . Brain metastases ([place]) 05/04/2016  . Mediastinal adenopathy 05/04/2016  . Peripheral neuropathy due to chemotherapy ([place]) 02/08/2016  . Long term current use of anticoagulant therapy 11/04/2014  . Peripheral edema 11/04/2014  . Knee pain, bilateral 11/04/2014  . Headache 07/14/2014  . Abdominal aortic atherosclerosis ([place]) 04/28/2014  . History of pulmonary embolism 02/07/2014  . Acute deep vein thrombosis (DVT) of tibial vein of right lower extremity ([place]) 02/07/2014  . DVT of leg (deep venous thrombosis) ([place]) 02/06/2014  . Multiple myeloma ([place]) 01/27/2012  . Hyponatremia 01/16/2012  . Anemia 01/16/2012  . Dyspnea 04/18/2011  . Preop cardiovascular exam 04/18/2011  . ABDOMINAL PAIN-RUQ 04/29/2010  . IRRITABLE BOWEL SYNDROME 02/19/2009  . GERD 01/03/2008  . PERSONAL HX COLONIC POLYPS 01/03/2008    Films show minimal contrast in colon Eloquis taken on Friday  On  agatroban infusion for multiple  new PE On steroids  Not clear this will get better as before Allow him to ambulate and ok to take in some clears for comfort May need ex lap  Early next week if this does not resolve but complications extremely high  No acute surgical need but he is aware of  the above Ok to move out of stepdown from surgery stand point    LOS: 2 days    Helmut Hennon A. 06/26/2016

## 2016-06-26 NOTE — Progress Notes (Signed)
Patient transferred from ICU. Agree with previous RN assessment. Patient denies any needs at this time. Will continue to monitor.

## 2016-06-26 NOTE — Progress Notes (Signed)
Initial Nutrition Assessment  DOCUMENTATION CODES:   Non-severe (moderate) malnutrition in context of acute illness/injury  INTERVENTION:   -Diet advancement per MD -Will discuss nutrition supplement options once diet is advanced (Pt does not prefer Ensure supplements) -Multivitamin with minerals daily -Encourage PO intake -RD to continue to monitor  NUTRITION DIAGNOSIS:   Increased nutrient needs related to cancer and cancer related treatments as evidenced by estimated needs.  GOAL:   Patient will meet greater than or equal to 90% of their needs  MONITOR:   PO intake, Labs, Weight trends, Skin, I & O's  REASON FOR ASSESSMENT:   Consult Assessment of nutrition requirement/status  ASSESSMENT:   75 y.o. male with medical history significant of recently diagnosed stage IV NSCLC (80% PDL1+) being treated with pembrolizomab (Keytruda, immunotherapy), recent SBO (which resolved with conservative management), DVT, multifocal PE (with possible HIT) on Eliquis now, GERD, multiple myeloma with skull lesion (s/p of radiation therapy 2016), chronic prostatitis, BPH, dCHF and diabetes insipidus on DDAVP, who presents with shortness of breath abdominal pain. Found to have recurrent partial SBO. Patient also reported intermittent episodes of chest discomfort (especially with deep breathing).   [name]. Output: 750 ml. Pt reports he was eating a solid diet at home since his discharge from [facility] on 12/13. [name] Currently pt on clear liquids for comfort. Briefly discussed protein options but pt states he does not like Ensure supplements. Pt sipping on some hot tea during visit. States he has had 4 BMs today. Denies swallowing problems or taste changes. States he has a tooth that is bothering him so he has difficulty chewing. States he relies on milkshakes and puddings mainly.  Per chart review, pt's weight is increased 8  lb since 12/10. May be fluid related. Pt states his UBW is 203-204 lb. Nutrition-Focused physical exam completed. Findings are mild fat depletion, mild muscle depletion, and mild edema.   Medications: Vitamin D tablet daily, Decadron IV every 12 hours, D5 infusion at 75 ml/hr provides 306 kcal Labs reviewed: CBGs: 117 Elevated Na Low Mg  Diet Order:  Diet clear liquid Room service appropriate? Yes; Fluid consistency: Thin  Skin:  Wound (see comment) (Rt foot wound)  Last BM:  12/14 per records -Pt reports 4 BMs today 12/17  Height:   Ht Readings from Last 1 Encounters:  06/24/16 '5\' 10"'$  (1.778 m)    Weight:   Wt Readings from Last 1 Encounters:  06/26/16 201 lb 11.5 oz (91.5 kg)    Ideal Body Weight:  75.5 kg  BMI:  Body mass index is 28.94 kg/m.  Estimated Nutritional Needs:   Kcal:  2200-2400  Protein:  115-125g  Fluid:  2.2L/day  EDUCATION NEEDS:   No education needs identified at this time  Clayton Bibles, MS, RD, LDN Pager: [phone] After Hours Pager: [phone]

## 2016-06-26 NOTE — Progress Notes (Signed)
ANTICOAGULATION CONSULT NOTE - f/u Consult  Pharmacy Consult for Argatroban Indication: pulmonary embolus  Allergies  Allergen Reactions  . Tramadol Palpitations    "Made my chest feel tight"  . Amoxicillin     REACTION: rash  . Penicillins Rash    Has patient had a PCN reaction causing immediate rash, facial/tongue/throat swelling, SOB or lightheadedness with hypotension: unknown Has patient had a PCN reaction causing severe rash involving mucus membranes or skin necrosis: unknown Has patient had a PCN reaction that required hospitalization: no  Has patient had a PCN reaction occurring within the last 10 years: no  If all of the above answers are "NO", then may proceed with Cephalosporin use.     Patient Measurements: Height: '5\' 10"'$  (177.8 cm) Weight: 201 lb 11.5 oz (91.5 kg) IBW/kg (Calculated) : 73  Vital Signs: Temp: 97.9 F (36.6 C) (12/17 1543) Temp Source: Oral (12/17 1543) BP: 118/81 (12/17 1543) Pulse Rate: 93 (12/17 1543)  Labs:  Recent Labs  06/24/16 1622 06/24/16 2151  06/25/16 0357 06/25/16 0819  06/26/16 0434  06/26/16 1149 06/26/16 1537 06/26/16 1905  HGB 9.2* 10.2*  --  8.7* 9.0*  --   --   --  9.8*  --   --   HCT 25.3* 28.8*  --  24.3* 25.6*  --   --   --  29.0*  --   --   PLT 95* 109*  --  86* 90*  --   --   --  111*  --   --   APTT  --  26  < > 48* 54*  < > 38*  < > 35 44* 43*  LABPROT  --  14.7  --   --   --   --   --   --   --   --   --   INR  --  1.14  --   --   --   --   --   --   --   --   --   CREATININE 0.86 0.99  --  0.94 0.90  --  1.06  --   --   --   --   TROPONINI <0.03  --   --   --   --   --   --   --   --   --   --   < > = values in this interval not displayed.  Estimated Creatinine Clearance: 68.5 mL/min (by C-G formula based on SCr of 1.06 mg/dL).  Assessment: 65 yoM admitted on 12/15 with PE and pharmacy is consulted to dose argatroban IV.  PMH is significant for metastatic lung cancer and chronic anticoagulation for history  of PE/DVT.  Recent admission 11/26-12/4 for SBO.  During that admission, Xarelto was held on 11/26 for NPO status and in case of surgical intervention.  He was started on IV Heparin on 11/28, but during the course of heparin, his Hgb trended down and Pltc trended down (baseline Pltc 120-170's, Pltc 68 on discharge). Heparin infusion was active from 11/28-12/1, but was held for low platelets on 12/1. No bleeding or complications were noted.  HIT panel not ordered at that time.  Xarelto was resumed on 12/3 and he was discharged on 12/4. Patient was re-admitted on 12/10 and was placed on Argatroban drip for PE until HIT ruled out. Heparin antibody came back slightly elevated, however SRA negative. Patient transitioned to Apixaban per Oncology recommendations and discharged on 12/13.  On 12/15, Patient presents with abdominal pain. CT scan shows recurrent SBO and mildly progressive RLL PE.   Last dose of Apixaban '10mg'$  PO PTA was 12/15 at 0800. FOBT +.   Today, 12/16  0430 aPTT = 38 sec (subtherapeutic)on 0.68 mcg/kg/min, rate increased  0843 aPTT = 39 sec (subtherapeutic) on 0.76 mcg/kg/min - very small change in result despite increase.  Pump infusing at correct rate via PAC  0949 Alaris using weight of 86.2kg (not able to pick this wt in CHL during order modification), CHL using wt of 98.3kg which appears to be an outlier from recent weights (including recent admission).  asked RN to change Alaris to today's weight since its more in line with other weights and also able to select this weight during order entry in CHL.   11:49 aPTT = 35 sec (subttherapeutic) on 1mg/kg/min.  Level decreased despite ~20% dose increase.    15:37 aPTT = 44 sec (subtherapeutic but rising) on 1.3 mcg/kg/min  19:05 aPTT = 43 sec on 1.572m/kg/min  CBC: HGB better, pltc better  + FOBT but no obvious bleeding noted  Note - 10kg weight difference from weight on recent admission so although wt based dose decreased,  rate actually increased  Likelihood of HIT in this patient is low as HIT ab was just slightly positive and SRA was negative. Continuing DTI d/t clinical picture (thrombocytopenia following heparin then new PE)  Goal of Therapy:  aPTT 50-70 seconds (per Dr. NiBlaine Hamperwould like to target lower end of goal range secondary to low Hgb, FOBT + on admission) Monitor platelets by anticoagulation protocol: Yes   Plan:   APTT remains subtherapeutic, increase to 2 mcg/kg/min (11 ml/hr) (targeting lower end of goal range per MD request).   No issues with infusion.  Does not appear to be leaking anywhere and RN looked at port and everything connected/infusing  Check CBC with next aPTT  Check aPTT every 2 hours until two therapeutic levels obtained, then daily thereafter.   CBC at least daily while on argatroban (currently ordered q6h per MD).   Monitor closely for s/sx of bleeding.  ToRomeo RabonPharmD, pager [phone]/17/2017,8:23 PM.

## 2016-06-26 NOTE — Progress Notes (Signed)
[facility] PROGRESS NOTE  [name] ZLD:[id number] DOB: Jan 04, 1941 DOA: 06/24/2016 PCP: [name], MD  Interim summary and HPI 75 y.o. male with medical history significant of recently diagnosed stage IV NSCLC (80% PDL1+) being treated with pembrolizomab (Keytruda, immunotherapy), recent SBO (which resolved with conservative management), DVT, multifocal PE (with possible HIT) on Eliquis now, GERD, multiple myeloma with skull lesion (s/p of radiation therapy 2016), chronic prostatitis, BPH, dCHF and diabetes insipidus on DDAVP, who presents with shortness of breath abdominal pain. Found to have recurrent partial SBO. Patient also reported intermittent episodes of chest discomfort (especially with deep breathing). Patient was FOBT in ED.  Assessment/Plan: Partial SBO: this is recurrent partial small bowel obstruction. General surgeon, Dr. Malachi Paradise was consulted-->concerns for carcinomatosis.  -will continue conservative management for stabilization -continue NGT and now CLD for comfort.  -had 2 BM's this morning   -general surgery on board and assessing with SB protocol -Highly appreciate Dr. Josetta Huddle recommendations -will replete electrolytes as needed and provide PRN antiemetics and analgesics.  Pulmonary Emboli: -will continue IV argatroban per pharm  -no sign sof infection on x-ray or CT chest. Will stop abx's. Especially given NPO status. -patient with recent 2-D echo on recent admission for PE and suggested mild heart right strain. There is no signs of fluid overload and currently hemodynamically stable. Will hold on repeat echo and continue anticoagulation with agatroban for now. -Eliquis on hold in case surgery is needed   Positive FOBT: Patient does not seem to have significant GI bleeding since hemoglobin is stable. -will continue IV pantoprazole and will follow Hgb trend  -blood transfusion as needed   Stage IV Lung Cancer: patient actively followed by Dr.  [name]. On immunotherapy, last dose of Lars Mage was 6 weeks ago per pt.  -continue outpatient f/u with Dr. [name]  Chronic diastolic CHF: 2-D echo on 06/2016 showed EF 60-65 percent with grade 1 diastolic dysfunction.  -no crackles on exam and no vascular congestion seen on his CXR -appears to be compensated overall -will follow volume status and daily weight  Hyponatremia/hypernatremia: sodium was 126 on admission; now 151. -holding PO meds for now -will switch IVF's to D5W -will monitor and resume IV DDAVP if needed -patient asymptomatic    Headache: most likely due to primary metastasized disease. MRI of brain on 05/12/16 showed metastasized disease. -Continue Decadron 2.0 mg twice a day -follow any rec's from oncology service  Hypokalemia: K= 3.3 on admission. -will monitor and replete as needed   Malnutrition of moderate degree: -nutrition consult requested -will need feeding supplements once clear to have diet advance   GERD: -continue PPI  BPH: -continue holding flomax for now (due to low Bp on presentation) -will monitor closely for signs of urinary retention; no issues at this moment   Code Status: Full Family Communication: no family at bedside  Disposition Plan: will transfer to telemetry bed; continue IV agatroban therapy and per general surgery rec's CLD diet for comfort, while continue NGT and intermittent suctioning. Has had 2 BM's this morning.  Consultants:  General surgery  PCCM  Procedures:  See below for x-ray reports   Antibiotics:  levaquin 12/13>>12/16  HPI/Subjective: Afebrile, no CP or SOB currently. Patient reports abd discomfort has continue improving and he has had 2 large BM's. No nausea or vomiting.   Objective: Vitals:   06/26/16 0600 06/26/16 0800  BP: 122/69   Pulse: 92   Resp: 14   Temp:  97.3 F (36.3 C)  Intake/Output Summary (Last 24 hours) at 06/26/16 0950 Last data filed at 06/26/16 0600  Gross per 24  hour  Intake          2784.06 ml  Output             4675 ml  Net         -1890.94 ml   Filed Weights   06/24/16 1936 06/24/16 2200 06/26/16 0500  Weight: 86.2 kg (190 lb) 98.3 kg (216 lb 11.4 oz) 91.5 kg (201 lb 11.5 oz)    Exam:   General:  Non-septic, denies CP and SOB currently. Abdomen feels less distended and had 2 BM's this morning. NGT still in place.  Cardiovascular: S1 and S2, no rubs, no gallops, no murmrus  Respiratory: good air movement, no crackles, no wheezing   Abdomen: no guarding, mild distension. Very mild tenderness to deep palpation (RLQ). Positive hypoactive BS  Musculoskeletal: no edema, no cyanosis   Data Reviewed: Basic Metabolic Panel:  Recent Labs Lab 06/24/16 1622 06/24/16 2151 06/25/16 0357 06/25/16 0819 06/26/16 0434  NA 126* 129* 133* 136 151*  K 3.3* 3.2* 4.0 4.0 3.6  CL 91* 94* 99* 104 116*  CO2 '26 25 24 26 27  '$ GLUCOSE 96 111* 140* 132* 109*  BUN '11 10 10 9 10  '$ CREATININE 0.86 0.99 0.94 0.90 1.06  CALCIUM 8.5* 8.7* 8.8* 9.0 9.5  MG  --   --  1.5*  --   --    Liver Function Tests:  Recent Labs Lab 06/19/16 1210 06/19/16 1615 06/24/16 1622  AST '15 17 18  '$ ALT '21 21 24  '$ ALKPHOS 65 70 70  BILITOT 0.9 1.0 0.6  PROT 5.2* 5.4* 5.2*  ALBUMIN 2.6* 2.7* 2.5*    Recent Labs Lab 06/24/16 1622  LIPASE 21   CBC:  Recent Labs Lab 06/19/16 1210  06/22/16 0419 06/24/16 1622 06/24/16 2151 06/25/16 0357 06/25/16 0819  WBC 5.7  < > 5.0 5.6 5.3 4.3 4.3  NEUTROABS 5.3  --   --  5.0  --   --   --   HGB 10.1*  < > 7.8* 9.2* 10.2* 8.7* 9.0*  HCT 27.9*  < > 22.5* 25.3* 28.8* 24.3* 25.6*  MCV 89.4  < > 94.1 90.4 89.7 92.0 92.4  PLT 75*  < > 70* 95* 109* 86* 90*  < > = values in this interval not displayed. Cardiac Enzymes:  Recent Labs Lab 06/24/16 1622  TROPONINI <0.03   BNP (last 3 results)  Recent Labs  06/05/16 2354 06/19/16 1210 06/24/16 2151  BNP 45.8 33.6 94.7   CBG:  Recent Labs Lab 06/25/16 0750  06/26/16 0816  GLUCAP 129* 117*    Recent Results (from the past 240 hour(s))  MRSA PCR Screening     Status: None   Collection Time: 06/19/16  4:10 PM  Result Value Ref Range Status   MRSA by PCR NEGATIVE NEGATIVE Final    Comment:        The GeneXpert MRSA Assay (FDA approved for NASAL specimens only), is one component of a comprehensive MRSA colonization surveillance program. It is not intended to diagnose MRSA infection nor to guide or monitor treatment for MRSA infections.   MRSA PCR Screening     Status: None   Collection Time: 06/25/16 12:27 AM  Result Value Ref Range Status   MRSA by PCR NEGATIVE NEGATIVE Final    Comment:        The GeneXpert MRSA Assay (FDA  approved for NASAL specimens only), is one component of a comprehensive MRSA colonization surveillance program. It is not intended to diagnose MRSA infection nor to guide or monitor treatment for MRSA infections.      Studies: Dg Chest 2 View  Result Date: 06/24/2016 CLINICAL DATA:  Weakness and shortness of Breath EXAM: CHEST  2 VIEW COMPARISON:  06/19/2016 FINDINGS: Cardiac shadow is enlarged. Right chest wall port is again seen. Patchy infiltrates are again noted in the right lung. Improved aeration in the left base is noted. No new focal abnormality is seen. No bony abnormality is noted. IMPRESSION: Improved aeration on the left with stable infiltrates on the right. Electronically Signed   By: Inez Catalina M.D.   On: 06/24/2016 16:38   Dg Abd 1 View  Result Date: 06/25/2016 CLINICAL DATA:  Small bowel obstruction EXAM: ABDOMEN - 1 VIEW COMPARISON:  06/25/2016 FINDINGS: Dilated small bowel loops noted within the abdomen compatible with small bowel obstruction. There may have been slight interval improvement in degree of distention. NG tube tip is in the proximal stomach with the side port near the GE junction. IMPRESSION: Small bowel obstruction pattern, slightly improved since prior study.  Electronically Signed   By: Rolm Baptise M.D.   On: 06/25/2016 08:17   Ct Head Wo Contrast  Result Date: 06/24/2016 CLINICAL DATA:  Weakness, nausea, shortness of breath, diagnosed with pulmonary embolism on 06/19/2016, headache, history melanoma, multiple myeloma, lung cancer EXAM: CT HEAD WITHOUT CONTRAST TECHNIQUE: Contiguous axial images were obtained from the base of the skull through the vertex without intravenous contrast. COMPARISON:  06/27/2016 FINDINGS: Brain: Generalized atrophy. Normal ventricular morphology. No midline shift or mass effect. Small vessel chronic ischemic changes of deep cerebral white matter. Previously identified metastases within the RIGHT cerebellum and RIGHT occipital lobe on prior contrast-enhanced CT and prior MR brain not identified by this noncontrast exam. No intracranial hemorrhage, visualized mass lesion, evidence acute infarction, or extra-axial fluid collection. Vascular: Unremarkable Skull: Intact Sinuses/Orbits: Probable small osteoma of the LEFT frontal sinus. Remaining paranasal sinuses and mastoid air cells clear. Other: N/A IMPRESSION: Atrophy with small vessel chronic ischemic changes of deep cerebral white matter. No acute intracranial abnormalities. Enhancing lesions within the RIGHT cerebellum and RIGHT occipital lobe consistent with metastases as identified on the previous exam are not identified by the current noncontrast study. Electronically Signed   By: Lavonia Dana M.D.   On: 06/24/2016 17:54   Ct Angio Chest Pe W And/or Wo Contrast  Result Date: 06/24/2016 CLINICAL DATA:  Shortness of breath. Recently diagnosed with pulmonary embolism. Taking Eliquis. Right chest pain. Stage IV metastatic small cell lung cancer. Multiple myeloma. EXAM: CT ANGIOGRAPHY CHEST CT ABDOMEN AND PELVIS WITH CONTRAST TECHNIQUE: Multidetector CT imaging of the chest was performed using the standard protocol during bolus administration of intravenous contrast. Multiplanar CT  image reconstructions and MIPs were obtained to evaluate the vascular anatomy. Multidetector CT imaging of the abdomen and pelvis was performed using the standard protocol during bolus administration of intravenous contrast. CONTRAST:  100 cc Isovue 370 COMPARISON:  Chest CTA dated 06/19/2016 and abdomen and pelvis CT dated 06/10/2016. FINDINGS: CTA CHEST FINDINGS Cardiovascular: Mildly progressive subsegmental pulmonary embolus in the right lower lobe. The remainder of the previously demonstrated segmental and subsegmental right lower lobe and right upper lobe pulmonary emboli have not changed significantly. No definite left lower lobe pulmonary emboli are seen today. Mediastinum/Nodes: The previously demonstrated hypodense left lobe thyroid nodule is not clearly visible today. The  previously demonstrated 1.5 cm short axis right peritracheal node has a short axis diameter of 1.5 cm on image number 30 of series 4. The previously demonstrated 1.2 cm short axis subcarinal node right has a short axis diameter of 1.4 cm on image number 38 of series 4. The previously demonstrated 1.7 cm short axis diameter right hilar node has a short axis diameter of 1.8 cm on image number 38 of series 4. A previously demonstrated 1.2 cm short axis left hilar node has a short axis diameter of 1.3 cm on image number 40 of series 4. Lungs/Pleura: No significant change in a small right pleural effusion. The previously demonstrated 1.5 x 1.2 cm irregular central right upper lobe nodule 1 measures 1.4 x 1.0 cm on image number 33 of series 12. The previously demonstrated 3 mm right middle lobe nodule measures 6 mm on image number 38 of series 12. Mildly progressive consolidated right middle lobe atelectasis. Progressive patchy interstitial opacities in both upper lobes. Mild right lower lobe atelectasis with improvement. Musculoskeletal: Thoracic spine degenerative changes. Review of the MIP images confirms the above findings. CT ABDOMEN and  PELVIS FINDINGS Hepatobiliary: No focal liver abnormality is seen. No gallstones, gallbladder wall thickening, or biliary dilatation. A small gallbladder fundal nodule with enhancement is again demonstrated. Pancreas: Diffusely atrophied. Spleen: Normal in size without focal abnormality. Adrenals/Urinary Tract: Stable left renal cortical scar. Normal appearing adrenal glands and right kidney. Distended urinary bladder with mildly dilated ureters. No hydronephrosis. Stomach/Bowel: Interval multiple dilated proximal small bowel loops with normal caliber distal small bowel loops. Unremarkable stomach and colon. No evidence of appendicitis. Vascular/Lymphatic: Mild atheromatous arterial calcifications, including the abdominal aorta. No enlarged lymph nodes. Reproductive: Minimally enlarged prostate gland. Other: Tiny amount of free peritoneal fluid in the posterior pelvis on the right. Musculoskeletal: Lumbar spine degenerative changes. Review of the MIP images confirms the above findings. IMPRESSION: 1. Mildly progressive right lower lobe pulmonary emboli. 2. Stable central right upper lobe pulmonary emboli. 3. Interval obstruction of the mid small bowel. The cause of obstruction is not identified. This is most likely due to an adhesion. 4. Stable small enhancing mass in the gallbladder fundus. 5. Progressive patchy interstitial opacities in both lungs. This could be due to infection, drug allergy, or patchy interstitial edema. 6. Stable small right pleural effusion with decreased right lower lobe atelectasis. 7. Little change in 2 right lung nodules. 8. Little change in mediastinal and bilateral hilar adenopathy. 9. Mild aortic atherosclerosis. Electronically Signed   By: Claudie Revering M.D.   On: 06/24/2016 18:30   Ct Abdomen Pelvis W Contrast  Result Date: 06/24/2016 CLINICAL DATA:  Shortness of breath. Recently diagnosed with pulmonary embolism. Taking Eliquis. Right chest pain. Stage IV metastatic small cell  lung cancer. Multiple myeloma. EXAM: CT ANGIOGRAPHY CHEST CT ABDOMEN AND PELVIS WITH CONTRAST TECHNIQUE: Multidetector CT imaging of the chest was performed using the standard protocol during bolus administration of intravenous contrast. Multiplanar CT image reconstructions and MIPs were obtained to evaluate the vascular anatomy. Multidetector CT imaging of the abdomen and pelvis was performed using the standard protocol during bolus administration of intravenous contrast. CONTRAST:  100 cc Isovue 370 COMPARISON:  Chest CTA dated 06/19/2016 and abdomen and pelvis CT dated 06/10/2016. FINDINGS: CTA CHEST FINDINGS Cardiovascular: Mildly progressive subsegmental pulmonary embolus in the right lower lobe. The remainder of the previously demonstrated segmental and subsegmental right lower lobe and right upper lobe pulmonary emboli have not changed significantly. No definite left lower lobe  pulmonary emboli are seen today. Mediastinum/Nodes: The previously demonstrated hypodense left lobe thyroid nodule is not clearly visible today. The previously demonstrated 1.5 cm short axis right peritracheal node has a short axis diameter of 1.5 cm on image number 30 of series 4. The previously demonstrated 1.2 cm short axis subcarinal node right has a short axis diameter of 1.4 cm on image number 38 of series 4. The previously demonstrated 1.7 cm short axis diameter right hilar node has a short axis diameter of 1.8 cm on image number 38 of series 4. A previously demonstrated 1.2 cm short axis left hilar node has a short axis diameter of 1.3 cm on image number 40 of series 4. Lungs/Pleura: No significant change in a small right pleural effusion. The previously demonstrated 1.5 x 1.2 cm irregular central right upper lobe nodule 1 measures 1.4 x 1.0 cm on image number 33 of series 12. The previously demonstrated 3 mm right middle lobe nodule measures 6 mm on image number 38 of series 12. Mildly progressive consolidated right middle  lobe atelectasis. Progressive patchy interstitial opacities in both upper lobes. Mild right lower lobe atelectasis with improvement. Musculoskeletal: Thoracic spine degenerative changes. Review of the MIP images confirms the above findings. CT ABDOMEN and PELVIS FINDINGS Hepatobiliary: No focal liver abnormality is seen. No gallstones, gallbladder wall thickening, or biliary dilatation. A small gallbladder fundal nodule with enhancement is again demonstrated. Pancreas: Diffusely atrophied. Spleen: Normal in size without focal abnormality. Adrenals/Urinary Tract: Stable left renal cortical scar. Normal appearing adrenal glands and right kidney. Distended urinary bladder with mildly dilated ureters. No hydronephrosis. Stomach/Bowel: Interval multiple dilated proximal small bowel loops with normal caliber distal small bowel loops. Unremarkable stomach and colon. No evidence of appendicitis. Vascular/Lymphatic: Mild atheromatous arterial calcifications, including the abdominal aorta. No enlarged lymph nodes. Reproductive: Minimally enlarged prostate gland. Other: Tiny amount of free peritoneal fluid in the posterior pelvis on the right. Musculoskeletal: Lumbar spine degenerative changes. Review of the MIP images confirms the above findings. IMPRESSION: 1. Mildly progressive right lower lobe pulmonary emboli. 2. Stable central right upper lobe pulmonary emboli. 3. Interval obstruction of the mid small bowel. The cause of obstruction is not identified. This is most likely due to an adhesion. 4. Stable small enhancing mass in the gallbladder fundus. 5. Progressive patchy interstitial opacities in both lungs. This could be due to infection, drug allergy, or patchy interstitial edema. 6. Stable small right pleural effusion with decreased right lower lobe atelectasis. 7. Little change in 2 right lung nodules. 8. Little change in mediastinal and bilateral hilar adenopathy. 9. Mild aortic atherosclerosis. Electronically Signed    By: Claudie Revering M.D.   On: 06/24/2016 18:30   Dg Abd Portable 1v-small Bowel Obstruction Protocol-initial, 8 Hr Delay  Result Date: 06/25/2016 CLINICAL DATA:  Small bowel obstruction. EXAM: PORTABLE ABDOMEN - 1 VIEW COMPARISON:  June 25, 2016 FINDINGS: The side port of the NG tube is near the GE junction, unchanged. Continued mild small bowel obstruction. IMPRESSION: Continued small bowel obstruction. The side port of the NG tube is near the GE junction, unchanged. Electronically Signed   By: Dorise Bullion III M.D   On: 06/25/2016 17:43   Dg Abd Portable 1v  Result Date: 06/25/2016 CLINICAL DATA:  75 year old male with small bowel obstruction status post NG tube placement. EXAM: PORTABLE ABDOMEN - 1 VIEW COMPARISON:  Abdominal CT dated 06/24/2016 FINDINGS: An enteric tube is partially visualized with side port and tip over the epigastric area likely in  the proximal stomach. Recommend advancing the tube by approximately 4 cm for optimal positioning. Multiple dilated air-filled loops of small bowel again noted measuring up to 5 cm in diameter. No free air or radiopaque calculi identified. There is osteopenia with advanced degenerative changes of the spine. No acute fracture. IMPRESSION: Enteric tube with tip and side-port likely in the proximal stomach. Persistent dilated small bowel loops.  Follow-up recommended. Electronically Signed   By: Anner Crete M.D.   On: 06/25/2016 02:03    Scheduled Meds: . cholecalciferol  1,000 Units Oral QHS  . dexamethasone  2 mg Intravenous Q12H  . mouth rinse  15 mL Mouth Rinse BID  . pantoprazole (PROTONIX) IV  40 mg Intravenous Q12H  . sodium chloride flush  3 mL Intravenous Q12H   Continuous Infusions: . argatroban 1 mcg/kg/min (06/26/16 0949)  . dextrose 1,000 mL (06/26/16 0825)    Principal Problem:   SBO (small bowel obstruction) Active Problems:   Hyponatremia   History of pulmonary embolism   Acute deep vein thrombosis (DVT) of tibial  vein of right lower extremity (HCC)   Headache   Long term current use of anticoagulant therapy   Brain metastases (HCC)   Adenocarcinoma of right lung, stage 4 (HCC)   Hypokalemia   Thrombocytopenia (HCC)   Malnutrition of moderate degree   GERD (gastroesophageal reflux disease)   Chronic diastolic CHF (congestive heart failure) (HCC)   GIB (gastrointestinal bleeding)   Benign prostatic hyperplasia without lower urinary tract symptoms    Time spent: 30 minutes    Barton Dubois  [facility] Pager [phone]. If 7PM-7AM, please contact night-coverage at [email or web address], password [facility] 06/26/2016, 9:50 AM  LOS: 2 days

## 2016-06-26 NOTE — Progress Notes (Signed)
ANTICOAGULATION CONSULT NOTE - Brief Note  Pharmacy Consult for Argatroban Indication: pulmonary embolus  APTT follow-up  12/16  0800 aPTT = 54 sec (at lower end goal). RN does report petechia on legs- but had PTA.  This was a peripheral stick unlike previous result when aPTT = 48sec  1100 aPTT = 55 sec  CBC: Hgb stable, pltc decreased but stable ? + FOBT but no obvious bleeding noted  Note - 10kg weight difference from weight on recent admission so although wt based dose decreased, rate actually increased  Likelihood of HIT in this patient is very low as HIT ab was just slightly positive and SRA was negative  Today, 12/17    0434 aPTT = 38 sec below goal via peripheral stick, no bleeding or infusion problems per RN   Goal of Therapy:  aPTT 50-70 seconds (per Dr. Blaine Hamper, would like to target lower end of goal range secondary to low Hgb, FOBT + on admission) Monitor platelets by anticoagulation protocol: Yes   Plan:   Increase Argatroban to 0.76 mcg/kg/min ( targeting lower end of goal range per MD request).   Check aPTT daily  CBC at least daily while on argatroban (currently ordered q6h per MD).   Monitor closely for s/sx of bleeding.   Dorrene German 06/26/2016 5:50 AM

## 2016-06-26 NOTE — Progress Notes (Signed)
ANTICOAGULATION CONSULT NOTE - f/u Consult  Pharmacy Consult for Argatroban Indication: pulmonary embolus  Allergies  Allergen Reactions  . Tramadol Palpitations    "Made my chest feel tight"  . Amoxicillin     REACTION: rash  . Penicillins Rash    Has patient had a PCN reaction causing immediate rash, facial/tongue/throat swelling, SOB or lightheadedness with hypotension: unknown Has patient had a PCN reaction causing severe rash involving mucus membranes or skin necrosis: unknown Has patient had a PCN reaction that required hospitalization: no  Has patient had a PCN reaction occurring within the last 10 years: no  If all of the above answers are "NO", then may proceed with Cephalosporin use.     Patient Measurements: Height: '5\' 10"'$  (177.8 cm) Weight: 201 lb 11.5 oz (91.5 kg) IBW/kg (Calculated) : 73  Vital Signs: Temp: 97.3 F (36.3 C) (12/17 0800) Temp Source: Oral (12/17 0800) BP: 122/69 (12/17 0600) Pulse Rate: 92 (12/17 0600)  Labs:  Recent Labs  06/24/16 1622 06/24/16 2151  06/25/16 0357 06/25/16 0819 06/25/16 1052 06/26/16 0434 06/26/16 0843  HGB 9.2* 10.2*  --  8.7* 9.0*  --   --   --   HCT 25.3* 28.8*  --  24.3* 25.6*  --   --   --   PLT 95* 109*  --  86* 90*  --   --   --   APTT  --  26  < > 48* 54* 55* 38* 39*  LABPROT  --  14.7  --   --   --   --   --   --   INR  --  1.14  --   --   --   --   --   --   CREATININE 0.86 0.99  --  0.94 0.90  --  1.06  --   TROPONINI <0.03  --   --   --   --   --   --   --   < > = values in this interval not displayed.  Estimated Creatinine Clearance: 68.5 mL/min (by C-G formula based on SCr of 1.06 mg/dL).  Assessment: 71 yoM admitted on 12/15 with PE and pharmacy is consulted to dose argatroban IV.  PMH is significant for metastatic lung cancer and chronic anticoagulation for history of PE/DVT.  Recent admission 11/26-12/4 for SBO.  During that admission, Xarelto was held on 11/26 for NPO status and in case of  surgical intervention.  He was started on IV Heparin on 11/28, but during the course of heparin, his Hgb trended down and Pltc trended down (baseline Pltc 120-170's, Pltc 68 on discharge). Heparin infusion was active from 11/28-12/1, but was held for low platelets on 12/1. No bleeding or complications were noted.  HIT panel not ordered at that time.  Xarelto was resumed on 12/3 and he was discharged on 12/4. Patient was re-admitted on 12/10 and was placed on Argatroban drip for PE until HIT ruled out. Heparin antibody came back slightly elevated, however SRA negative. Patient transitioned to Apixaban per Oncology recommendations and discharged on 12/13.   On 12/15, Patient presents with abdominal pain. CT scan shows recurrent SBO and mildly progressive RLL PE.   Last dose of Apixaban '10mg'$  PO PTA was 12/15 at 0800. FOBT +.   Today, 12/16  0430 aPTT = 38 sec (subtherapeutic)on 0.68 mcg/kg/min, rate increased  0843 aPTT = 39 sec (subtherapeutic) on 0.76 mcg/kg/min - very small change in result despite increase.  Pump  infusing at correct rate via PAC  Alaris using weight of 86.2kg (not able to pick this wt in CHL during order modification), CHL using wt of 98.3kg which appears to be an outlier from recent weights (including recent admission).  Will ask RN to change Alaris to today's weight since its more in line with other weights and also use this weight in CHL going forward  CBC: CBC not drawn today?  + FOBT but no obvious bleeding noted  Note - 10kg weight difference from weight on recent admission so although wt based dose decreased, rate actually increased  Likelihood of HIT in this patient is low as HIT ab was just slightly positive and SRA was negative. Continuing DTI d/t clinical picture (thrombocytopenia following heparin then new PE)  Goal of Therapy:  aPTT 50-70 seconds (per Dr. Blaine Hamper, would like to target lower end of goal range secondary to low Hgb, FOBT + on admission) Monitor  platelets by anticoagulation protocol: Yes   Plan:   APTT subtherapeutic, increase 5.5 m//hr (1 mcg/kg/min) ( targeting lower end of goal range per MD request).   Have RN reset weight in Alaris pump so that CHL and Alaris wt match up (unable to choose weight in CHL that Alaris was using)  Check CBC with next aPTT  Check aPTT every 2 hours until two therapeutic levels obtained, then daily thereafter.   CBC at least daily while on argatroban (currently ordered q6h per MD).   Monitor closely for s/sx of bleeding.  Doreene Eland, PharmD, BCPS.   Pager: [phone] 06/26/2016 9:17 AM

## 2016-06-27 ENCOUNTER — Inpatient Hospital Stay (HOSPITAL_COMMUNITY): Payer: Medicare Other

## 2016-06-27 ENCOUNTER — Telehealth: Payer: Self-pay | Admitting: *Deleted

## 2016-06-27 LAB — APTT: APTT: 53 s — AB (ref 24–36)

## 2016-06-27 LAB — CBC
HCT: 27 % — ABNORMAL LOW (ref 39.0–52.0)
Hemoglobin: 8.7 g/dL — ABNORMAL LOW (ref 13.0–17.0)
MCH: 32.2 pg (ref 26.0–34.0)
MCHC: 32.2 g/dL (ref 30.0–36.0)
MCV: 100 fL (ref 78.0–100.0)
PLATELETS: 88 10*3/uL — AB (ref 150–400)
RBC: 2.7 MIL/uL — ABNORMAL LOW (ref 4.22–5.81)
RDW: 19.3 % — AB (ref 11.5–15.5)
WBC: 3.4 10*3/uL — AB (ref 4.0–10.5)

## 2016-06-27 LAB — BASIC METABOLIC PANEL
Anion gap: 10 (ref 5–15)
CALCIUM: 8.8 mg/dL — AB (ref 8.9–10.3)
CHLORIDE: 115 mmol/L — AB (ref 101–111)
CO2: 26 mmol/L (ref 22–32)
CREATININE: 1 mg/dL (ref 0.61–1.24)
Glucose, Bld: 152 mg/dL — ABNORMAL HIGH (ref 65–99)
Potassium: 3.7 mmol/L (ref 3.5–5.1)
SODIUM: 151 mmol/L — AB (ref 135–145)

## 2016-06-27 LAB — GLUCOSE, CAPILLARY: GLUCOSE-CAPILLARY: 124 mg/dL — AB (ref 65–99)

## 2016-06-27 NOTE — Telephone Encounter (Signed)
Pt called wanting to let Dr. [name] know re:  Pt was admitted to the hospital morning of Friday 12/15 for pulmonary embolism per pt.  Pt would like to talk to Dr. [name] about several issues - tests to be done today, and other plan of care. Pt is currently in room  1417  [facility].

## 2016-06-27 NOTE — Progress Notes (Signed)
Patient ID: [name], male   DOB: [birth year], 75 y.o.   MRN: [id number]  Patient tolerating clamping trial well. D/c NG tube and advance to clear liquid diet. [name] A MILLER

## 2016-06-27 NOTE — Progress Notes (Signed)
ANTICOAGULATION CONSULT NOTE - f/u Consult  Pharmacy Consult for Argatroban Indication: pulmonary embolus  Allergies  Allergen Reactions  . Tramadol Palpitations    "Made my chest feel tight"  . Amoxicillin     REACTION: rash  . Penicillins Rash    Has patient had a PCN reaction causing immediate rash, facial/tongue/throat swelling, SOB or lightheadedness with hypotension: unknown Has patient had a PCN reaction causing severe rash involving mucus membranes or skin necrosis: unknown Has patient had a PCN reaction that required hospitalization: no  Has patient had a PCN reaction occurring within the last 10 years: no  If all of the above answers are "NO", then may proceed with Cephalosporin use.     Patient Measurements: Height: '5\' 10"'$  (177.8 cm) Weight: 193 lb 12.6 oz (87.9 kg) IBW/kg (Calculated) : 73  Vital Signs: Temp: 98 F (36.7 C) (12/18 0449) Temp Source: Oral (12/18 0449) BP: 117/79 (12/18 0449) Pulse Rate: 85 (12/18 0449)  Labs:  Recent Labs  06/24/16 1622 06/24/16 2151  06/25/16 0819  06/26/16 0434  06/26/16 1149  06/26/16 1905 06/26/16 2315 06/27/16 0514  HGB 9.2* 10.2*  < > 9.0*  --   --   --  9.8*  --   --   --  8.7*  HCT 25.3* 28.8*  < > 25.6*  --   --   --  29.0*  --   --   --  27.0*  PLT 95* 109*  < > 90*  --   --   --  111*  --   --   --  88*  APTT  --  26  < > 54*  < > 38*  < > 35  < > 43* 53* 53*  LABPROT  --  14.7  --   --   --   --   --   --   --   --   --   --   INR  --  1.14  --   --   --   --   --   --   --   --   --   --   CREATININE 0.86 0.99  < > 0.90  --  1.06  --   --   --   --   --  1.00  TROPONINI <0.03  --   --   --   --   --   --   --   --   --   --   --   < > = values in this interval not displayed.  Estimated Creatinine Clearance: 71.3 mL/min (by C-G formula based on SCr of 1 mg/dL).  Assessment: 39 yoM admitted on 12/15 with PE and pharmacy is consulted to dose argatroban IV.  PMH is significant for metastatic lung cancer and  chronic anticoagulation for history of PE/DVT.  Recent admission 11/26-12/4 for SBO.  During that admission, Xarelto was held on 11/26 for NPO status and in case of surgical intervention.  He was started on IV Heparin on 11/28, but during the course of heparin, his Hgb trended down and Pltc trended down (baseline Pltc 120-170's, Pltc 68 on discharge). Heparin infusion was active from 11/28-12/1, but was held for low platelets on 12/1. No bleeding or complications were noted.  HIT panel not ordered at that time.  Xarelto was resumed on 12/3 and he was discharged on 12/4. Patient was re-admitted on 12/10 and was placed on Argatroban drip for PE  until HIT ruled out. Heparin antibody came back slightly elevated, however SRA negative. Patient transitioned to Apixaban per Oncology recommendations and discharged on 12/13.   On 12/15, Patient presents with abdominal pain. CT scan shows recurrent SBO and mildly progressive RLL PE.   Last dose of Apixaban '10mg'$  PO PTA was 12/15 at 0800. FOBT +.   12/17  0430 aPTT = 38 sec (subtherapeutic)on 0.68 mcg/kg/min, rate increased  0843 aPTT = 39 sec (subtherapeutic) on 0.76 mcg/kg/min - very small change in result despite increase.  Pump infusing at correct rate via PAC  0949 Alaris using weight of 86.2kg (not able to pick this wt in CHL during order modification), CHL using wt of 98.3kg which appears to be an outlier from recent weights (including recent admission).  asked RN to change Alaris to today's weight since its more in line with other weights and also able to select this weight during order entry in CHL.   11:49 aPTT = 35 sec (subttherapeutic) on 70mg/kg/min.  Level decreased despite ~20% dose increase.    15:37 aPTT = 44 sec (subtherapeutic but rising) on 1.3 mcg/kg/min  19:05 aPTT = 43 sec on 1.5884m/kg/min  CBC: HGB better, pltc better  + FOBT but no obvious bleeding noted  Note - 10kg weight difference from weight on recent admission so although  wt based dose decreased, rate actually increased  Likelihood of HIT in this patient is low as HIT ab was just slightly positive and SRA was negative. Continuing DTI d/t clinical picture (thrombocytopenia following heparin then new PE)  2315 aPTT=53 sec on 2 mcg/kg/min, no infusion problems or bleeding per RN  12/18  0514 aPTT= 53 sec on 84m56mkg/min, no infusion problems or bleeding per RN  Goal of Therapy:  aPTT 50-70 seconds (per Dr. NiuBlaine Hamperould like to target lower end of goal range secondary to low Hgb, FOBT + on admission) Monitor platelets by anticoagulation protocol: Yes   Plan:   Continue argatroban at  2 mcg/kg/min (11 ml/hr) (targeting lower end of goal range per MD request).   Daily  aPTT  CBC at least daily while on argatroban  Monitor closely for s/sx of bleeding.  EllDolly Riash 06/27/2016, 7:47 AM Pager [phone]

## 2016-06-27 NOTE — Progress Notes (Signed)
[facility] PROGRESS NOTE  [name] HPH:[id number] DOB: [birth year] DOA: 06/24/2016 PCP: Lavonda Jumbo, MD  Interim summary and HPI 75 y.o. male with medical history significant of recently diagnosed stage IV NSCLC (80% PDL1+) being treated with pembrolizomab (Keytruda, immunotherapy), recent SBO (which resolved with conservative management), DVT, multifocal PE (with possible HIT) on Eliquis now, GERD, multiple myeloma with skull lesion (s/p of radiation therapy 2016), chronic prostatitis, BPH, dCHF and diabetes insipidus on DDAVP, who presents with shortness of breath abdominal pain. Found to have recurrent partial SBO. Patient also reported intermittent episodes of chest discomfort (especially with deep breathing). Patient was FOBT in ED.  Assessment/Plan: Partial SBO: this is recurrent partial small bowel obstruction. General surgeon, Dr. Davina Poke was consulted-->concerns for carcinomatosis.  -will continue conservative management for stabilization -continue NGT, now clamping trial and assess tolerance before removing it. On CLD -had large liquid BM's overnight; also passing gas -general surgery on board and assessing with SB protocol -Highly appreciate general surgery recommendations -will replete electrolytes as needed and provide PRN antiemetics and analgesics.  Pulmonary Emboli: -will continue IV argatroban per pharm  -no sign sof infection on x-ray or CT chest. Will stop abx's. Especially given NPO status. -patient with recent 2-D echo on recent admission for PE and suggested mild heart right strain. There is no signs of fluid overload and currently hemodynamically stable. Will hold on repeat echo and continue anticoagulation with agatroban for now. -Eliquis on hold in case surgery is needed   Positive FOBT: Patient does not seem to have significant GI bleeding since hemoglobin is stable. -will continue IV pantoprazole and will follow Hgb trend  -blood transfusion as  needed   Stage IV Lung Cancer: patient actively followed by Dr. Arbutus Ped. On immunotherapy, last dose of Hali Marry was 6 weeks ago per pt.  -continue outpatient f/u with Dr. Arbutus Ped  Chronic diastolic CHF: 2-D echo on 06/2016 showed EF 60-65 percent with grade 1 diastolic dysfunction.  -no crackles on exam and no vascular congestion seen on his CXR -appears to be compensated overall -will follow volume status and daily weight  Hyponatremia/hypernatremia: sodium was 126 on admission; now 151. -holding PO meds for now -will continue D5W -will monitor and resume IV DDAVP if needed; currently asymptomatic and stable (anticiopating will start coming down)  Headache: most likely due to primary metastasized disease. MRI of brain on 05/12/16 showed metastasized disease. -Continue Decadron 2.0 mg twice a day -follow any rec's from oncology service -continue PRN pain meds  Hypokalemia: K= 3.3 on admission. -repleted and WNL now -will monitor and replete as needed   Malnutrition of moderate degree: -nutrition consult requested -will need feeding supplements once clear to have diet advance   GERD: -continue PPI  BPH: -will resume as soon as able to tolerate PO's -will monitor closely for signs of urinary retention; no issues at this moment   Code Status: Full Family Communication: no family at bedside  Disposition Plan: will transfer to telemetry bed; continue IV agatroban therapy and per general surgery rec's CLD diet for comfort, while continue NGT and intermittent suctioning. Has had 2 BM's this morning.  Consultants:  General surgery  PCCM  Procedures:  See below for x-ray reports   Antibiotics:  levaquin 12/13>>12/16  HPI/Subjective: Afebrile, no CP or SOB currently. Patient reports abd discomfort has continue improving and he has had multiple liquid large BM's overnight. No nausea or vomiting.   Objective: Vitals:   06/26/16 2106 06/27/16 0449  BP: 116/79  117/79  Pulse: 100 85  Resp: 18 16  Temp: 98.1 F (36.7 C) 98 F (36.7 C)    Intake/Output Summary (Last 24 hours) at 06/27/16 1357 Last data filed at 06/27/16 1156  Gross per 24 hour  Intake          1816.68 ml  Output             4300 ml  Net         -2483.32 ml   Filed Weights   06/24/16 2200 06/26/16 0500 06/27/16 0608  Weight: 98.3 kg (216 lb 11.4 oz) 91.5 kg (201 lb 11.5 oz) 87.9 kg (193 lb 12.6 oz)    Exam:   General:  Non-septic, denies CP and SOB currently. Abdomen feels less distended and had multiple large liquid BM's yesterday 12/17. NGT still in place. But currently on clamping trial  Cardiovascular: S1 and S2, no rubs, no gallops, no murmrus  Respiratory: good air movement, no crackles, no wheezing   Abdomen: no guarding, mild distension. Very mild tenderness to deep palpation (RLQ). Positive hypoactive BS  Musculoskeletal: trace edema bilaterally, no cyanosis   Data Reviewed: Basic Metabolic Panel:  Recent Labs Lab 06/24/16 2151 06/25/16 0357 06/25/16 0819 06/26/16 0434 06/27/16 0514  NA 129* 133* 136 151* 151*  K 3.2* 4.0 4.0 3.6 3.7  CL 94* 99* 104 116* 115*  CO2 '25 24 26 27 26  '$ GLUCOSE 111* 140* 132* 109* 152*  BUN '10 10 9 10 '$ <5*  CREATININE 0.99 0.94 0.90 1.06 1.00  CALCIUM 8.7* 8.8* 9.0 9.5 8.8*  MG  --  1.5*  --   --   --    Liver Function Tests:  Recent Labs Lab 06/24/16 1622  AST 18  ALT 24  ALKPHOS 70  BILITOT 0.6  PROT 5.2*  ALBUMIN 2.5*    Recent Labs Lab 06/24/16 1622  LIPASE 21   CBC:  Recent Labs Lab 06/24/16 1622 06/24/16 2151 06/25/16 0357 06/25/16 0819 06/26/16 1149 06/27/16 0514  WBC 5.6 5.3 4.3 4.3 3.5* 3.4*  NEUTROABS 5.0  --   --   --   --   --   HGB 9.2* 10.2* 8.7* 9.0* 9.8* 8.7*  HCT 25.3* 28.8* 24.3* 25.6* 29.0* 27.0*  MCV 90.4 89.7 92.0 92.4 98.3 100.0  PLT 95* 109* 86* 90* 111* 88*   Cardiac Enzymes:  Recent Labs Lab 06/24/16 1622  TROPONINI <0.03   BNP (last 3 results)  Recent  Labs  06/05/16 2354 06/19/16 1210 06/24/16 2151  BNP 45.8 33.6 94.7   CBG:  Recent Labs Lab 06/25/16 0750 06/26/16 0816 06/27/16 0804  GLUCAP 129* 117* 124*    Recent Results (from the past 240 hour(s))  MRSA PCR Screening     Status: None   Collection Time: 06/19/16  4:10 PM  Result Value Ref Range Status   MRSA by PCR NEGATIVE NEGATIVE Final    Comment:        The GeneXpert MRSA Assay (FDA approved for NASAL specimens only), is one component of a comprehensive MRSA colonization surveillance program. It is not intended to diagnose MRSA infection nor to guide or monitor treatment for MRSA infections.   Culture, blood (single)     Status: None (Preliminary result)   Collection Time: 06/24/16 10:01 PM  Result Value Ref Range Status   Specimen Description BLOOD RIGHT ANTECUBITAL  Final   Special Requests BOTTLES DRAWN AEROBIC AND ANAEROBIC 5ML EA  Final   Culture   Final  NO GROWTH 1 DAY Performed at [facility]    Report Status PENDING  Incomplete  MRSA PCR Screening     Status: None   Collection Time: 06/25/16 12:27 AM  Result Value Ref Range Status   MRSA by PCR NEGATIVE NEGATIVE Final    Comment:        The GeneXpert MRSA Assay (FDA approved for NASAL specimens only), is one component of a comprehensive MRSA colonization surveillance program. It is not intended to diagnose MRSA infection nor to guide or monitor treatment for MRSA infections.      Studies: Dg Abd 1 View  Result Date: 06/27/2016 CLINICAL DATA:  Small-bowel obstruction EXAM: ABDOMEN - 1 VIEW COMPARISON:  05/26/2016 FINDINGS: Nasogastric catheter remains in the stomach. Scattered large and small bowel gas is noted. Contrast from recent CT examination now lies within the distal colon. The degree of small bowel obstruction has resolved. No free air is seen.Degenerative changes of the lumbar spine are noted. IMPRESSION: Scattered large and small bowel gas. No definitive obstructive  changes are seen. Electronically Signed   By: Inez Catalina M.D.   On: 06/27/2016 08:37   Dg Abd Portable 1v-small Bowel Obstruction Protocol-initial, 8 Hr Delay  Result Date: 06/25/2016 CLINICAL DATA:  Small bowel obstruction. EXAM: PORTABLE ABDOMEN - 1 VIEW COMPARISON:  June 25, 2016 FINDINGS: The side port of the NG tube is near the GE junction, unchanged. Continued mild small bowel obstruction. IMPRESSION: Continued small bowel obstruction. The side port of the NG tube is near the GE junction, unchanged. Electronically Signed   By: Dorise Bullion III M.D   On: 06/25/2016 17:43    Scheduled Meds: . cholecalciferol  1,000 Units Oral QHS  . dexamethasone  2 mg Intravenous Q12H  . mouth rinse  15 mL Mouth Rinse BID  . pantoprazole (PROTONIX) IV  40 mg Intravenous Q12H  . sodium chloride flush  3 mL Intravenous Q12H   Continuous Infusions: . argatroban 2 mcg/kg/min (06/27/16 1350)  . dextrose 75 mL/hr at 06/27/16 1220    Principal Problem:   SBO (small bowel obstruction) Active Problems:   Hyponatremia   History of pulmonary embolism   Acute deep vein thrombosis (DVT) of tibial vein of right lower extremity (HCC)   Headache   Long term current use of anticoagulant therapy   Brain metastases (HCC)   Adenocarcinoma of right lung, stage 4 (HCC)   Hypokalemia   Thrombocytopenia (HCC)   Malnutrition of moderate degree   GERD (gastroesophageal reflux disease)   Chronic diastolic CHF (congestive heart failure) (HCC)   GIB (gastrointestinal bleeding)   Benign prostatic hyperplasia without lower urinary tract symptoms    Time spent: 30 minutes    Barton Dubois  [facility] Pager [phone]. If 7PM-7AM, please contact night-coverage at [email or web address], password [facility] 06/27/2016, 1:57 PM  LOS: 3 days

## 2016-06-27 NOTE — Progress Notes (Signed)
  Subjective: Feeling better. Has had large liquid bm's  Objective: Vital signs in last 24 hours: Temp:  [97.9 F (36.6 C)-99.6 F (37.6 C)] 98 F (36.7 C) (12/18 0449) Pulse Rate:  [85-116] 85 (12/18 0449) Resp:  [16-22] 16 (12/18 0449) BP: (116-140)/(66-81) 117/79 (12/18 0449) SpO2:  [94 %-100 %] 100 % (12/18 0449) Weight:  [87.9 kg (193 lb 12.6 oz)] 87.9 kg (193 lb 12.6 oz) (12/18 1610) Last BM Date: 06/23/16  Intake/Output from previous day: 12/17 0701 - 12/18 0700 In: 3342.1 [P.O.:2190; I.V.:1002.1; NG/GT:150] Out: 4400 [Urine:3400; Emesis/NG output:1000] Intake/Output this shift: Total I/O In: 400 [P.O.:400] Out: 650 [Urine:650]  Resp: clear to auscultation bilaterally Cardio: regular rate and rhythm GI: soft, nontender  Lab Results:   Recent Labs  06/26/16 1149 06/27/16 0514  WBC 3.5* 3.4*  HGB 9.8* 8.7*  HCT 29.0* 27.0*  PLT 111* 88*   BMET  Recent Labs  06/26/16 0434 06/27/16 0514  NA 151* 151*  K 3.6 3.7  CL 116* 115*  CO2 27 26  GLUCOSE 109* 152*  BUN 10 <5*  CREATININE 1.06 1.00  CALCIUM 9.5 8.8*   PT/INR  Recent Labs  06/24/16 2151  LABPROT 14.7  INR 1.14   ABG No results for input(s): PHART, HCO3 in the last 72 hours.  Invalid input(s): PCO2, PO2  Studies/Results: Dg Abd 1 View  Result Date: 06/27/2016 CLINICAL DATA:  Small-bowel obstruction EXAM: ABDOMEN - 1 VIEW COMPARISON:  05/26/2016 FINDINGS: Nasogastric catheter remains in the stomach. Scattered large and small bowel gas is noted. Contrast from recent CT examination now lies within the distal colon. The degree of small bowel obstruction has resolved. No free air is seen.Degenerative changes of the lumbar spine are noted. IMPRESSION: Scattered large and small bowel gas. No definitive obstructive changes are seen. Electronically Signed   By: Inez Catalina M.D.   On: 06/27/2016 08:37   Dg Abd Portable 1v-small Bowel Obstruction Protocol-initial, 8 Hr Delay  Result Date:  06/25/2016 CLINICAL DATA:  Small bowel obstruction. EXAM: PORTABLE ABDOMEN - 1 VIEW COMPARISON:  June 25, 2016 FINDINGS: The side port of the NG tube is near the GE junction, unchanged. Continued mild small bowel obstruction. IMPRESSION: Continued small bowel obstruction. The side port of the NG tube is near the GE junction, unchanged. Electronically Signed   By: Dorise Bullion III M.D   On: 06/25/2016 17:43    Anti-infectives: Anti-infectives    Start     Dose/Rate Route Frequency Ordered Stop   06/25/16 1000  levofloxacin (LEVAQUIN) tablet 500 mg  Status:  Discontinued     500 mg Oral Daily 06/24/16 2026 06/25/16 0941      Assessment/Plan: s/p * No surgery found * sbo seems to be resolving. will try clamping ng and monitor  Stage 4 lung cancer per medicine and oncology  LOS: 3 days    TOTH III,PAUL S 06/27/2016

## 2016-06-28 ENCOUNTER — Ambulatory Visit: Payer: Self-pay

## 2016-06-28 ENCOUNTER — Ambulatory Visit: Payer: Self-pay | Admitting: Internal Medicine

## 2016-06-28 ENCOUNTER — Other Ambulatory Visit: Payer: Self-pay

## 2016-06-28 DIAGNOSIS — R195 Other fecal abnormalities: Secondary | ICD-10-CM

## 2016-06-28 LAB — CBC
HCT: 27.4 % — ABNORMAL LOW (ref 39.0–52.0)
Hemoglobin: 8.9 g/dL — ABNORMAL LOW (ref 13.0–17.0)
MCH: 32.8 pg (ref 26.0–34.0)
MCHC: 32.5 g/dL (ref 30.0–36.0)
MCV: 101.1 fL — AB (ref 78.0–100.0)
Platelets: 68 10*3/uL — ABNORMAL LOW (ref 150–400)
RBC: 2.71 MIL/uL — ABNORMAL LOW (ref 4.22–5.81)
RDW: 18.9 % — AB (ref 11.5–15.5)
WBC: 4.1 10*3/uL (ref 4.0–10.5)

## 2016-06-28 LAB — APTT: aPTT: 57 seconds — ABNORMAL HIGH (ref 24–36)

## 2016-06-28 LAB — BASIC METABOLIC PANEL
ANION GAP: 7 (ref 5–15)
BUN: 6 mg/dL (ref 6–20)
CO2: 31 mmol/L (ref 22–32)
CREATININE: 0.89 mg/dL (ref 0.61–1.24)
Calcium: 8.9 mg/dL (ref 8.9–10.3)
Chloride: 116 mmol/L — ABNORMAL HIGH (ref 101–111)
GFR calc non Af Amer: >60 mL/min (ref >60)
GLUCOSE: 134 mg/dL — AB (ref 65–99)
Potassium: 3.6 mmol/L (ref 3.5–5.1)
Sodium: 154 mmol/L — ABNORMAL HIGH (ref 135–145)

## 2016-06-28 LAB — GLUCOSE, CAPILLARY: Glucose-Capillary: 116 mg/dL — ABNORMAL HIGH (ref 65–99)

## 2016-06-28 MED ORDER — TAMSULOSIN HCL 0.4 MG PO CAPS
0.4000 mg | ORAL_CAPSULE | Freq: Every day | ORAL | Status: DC
  Administered 2016-06-28 – 2016-07-04 (×6): 0.4 mg via ORAL
  Filled 2016-06-28 (×6): qty 1

## 2016-06-28 MED ORDER — POLYETHYLENE GLYCOL 3350 17 G PO PACK: 17.0000 g | PACK | Freq: Every day | ORAL | Status: DC | PRN

## 2016-06-28 MED ORDER — APIXABAN 5 MG PO TABS
5.0000 mg | ORAL_TABLET | Freq: Two times a day (BID) | ORAL | Status: DC
  Administered 2016-06-28 – 2016-06-29 (×2): 5 mg via ORAL
  Filled 2016-06-28 (×2): qty 1

## 2016-06-28 MED ORDER — DESMOPRESSIN ACETATE 0.1 MG PO TABS
0.1000 mg | ORAL_TABLET | Freq: Every day | ORAL | Status: DC
  Administered 2016-06-28 – 2016-07-10 (×11): 0.1 mg via ORAL
  Filled 2016-06-28 (×15): qty 1

## 2016-06-28 MED ORDER — RISAQUAD PO CAPS
1.0000 | ORAL_CAPSULE | Freq: Every day | ORAL | Status: DC
  Administered 2016-06-28 – 2016-06-29 (×2): 1 via ORAL
  Filled 2016-06-28 (×2): qty 1

## 2016-06-28 MED ORDER — PANTOPRAZOLE SODIUM 40 MG PO TBEC
40.0000 mg | DELAYED_RELEASE_TABLET | Freq: Two times a day (BID) | ORAL | Status: DC
  Administered 2016-06-28 – 2016-06-29 (×2): 40 mg via ORAL
  Filled 2016-06-28 (×2): qty 1

## 2016-06-28 MED ORDER — GUAIFENESIN-DM 100-10 MG/5ML PO SYRP
5.0000 mL | ORAL_SOLUTION | ORAL | Status: DC | PRN
  Administered 2016-06-28 (×2): 5 mL via ORAL
  Filled 2016-06-28 (×2): qty 10

## 2016-06-28 NOTE — Progress Notes (Signed)
Subjective: Happy with the NG out, and getting clears, Having loose fluid like stool.  No nausea or pain.    Objective: Vital signs in last 24 hours: Temp:  [98.5 F (36.9 C)-99.8 F (37.7 C)] 98.5 F (36.9 C) (12/19 3762) Pulse Rate:  [90-103] 90 (12/19 0637) Resp:  [16-17] 17 (12/19 0637) BP: (109-127)/(66-79) 109/66 (12/19 0637) SpO2:  [96 %-99 %] 99 % (12/19 0637) Last BM Date: 06/26/16 1100 PO recoreded Urine 3132 IV fluids 2000 BM x 1  Afebrile, VSS Hypernatremia yesterday Stable anemia, + fecal occult 06/24/16 Film yesterday:  stomach. Scattered large and small bowel gas is noted. Contrast from recent CT examination now lies within the distal colon. The degree of small bowel obstruction has resolved. No free air is seen Intake/Output from previous day: 12/18 0701 - 12/19 0700 In: 3132.5 [P.O.:1100; I.V.:2032.5] Out: 3575 [Urine:3575] Intake/Output this shift: Total I/O In: -  Out: 300 [Urine:300]  General appearance: alert, cooperative and no distress GI: soft, non-tender; bowel sounds normal; no masses,  no organomegaly  Lab Results:   Recent Labs  06/27/16 0514 06/28/16 0535  WBC 3.4* 4.1  HGB 8.7* 8.9*  HCT 27.0* 27.4*  PLT 88* 68*    BMET  Recent Labs  06/27/16 0514 06/28/16 0535  NA 151* 154*  K 3.7 3.6  CL 115* 116*  CO2 26 31  GLUCOSE 152* 134*  BUN <5* 6  CREATININE 1.00 0.89  CALCIUM 8.8* 8.9   PT/INR No results for input(s): LABPROT, INR in the last 72 hours.   Recent Labs Lab 06/24/16 1622  AST 18  ALT 24  ALKPHOS 70  BILITOT 0.6  PROT 5.2*  ALBUMIN 2.5*     Lipase     Component Value Date/Time   LIPASE 21 06/24/2016 1622     Studies/Results: Dg Abd 1 View  Result Date: 06/27/2016 CLINICAL DATA:  Small-bowel obstruction EXAM: ABDOMEN - 1 VIEW COMPARISON:  05/26/2016 FINDINGS: Nasogastric catheter remains in the stomach. Scattered large and small bowel gas is noted. Contrast from recent CT examination now lies  within the distal colon. The degree of small bowel obstruction has resolved. No free air is seen.Degenerative changes of the lumbar spine are noted. IMPRESSION: Scattered large and small bowel gas. No definitive obstructive changes are seen. Electronically Signed   By: Inez Catalina M.D.   On: 06/27/2016 08:37    Medications: . cholecalciferol  1,000 Units Oral QHS  . desmopressin  0.1 mg Oral QHS  . dexamethasone  2 mg Intravenous Q12H  . mouth rinse  15 mL Mouth Rinse BID  . pantoprazole (PROTONIX) IV  40 mg Intravenous Q12H  . sodium chloride flush  3 mL Intravenous Q12H  . tamsulosin  0.4 mg Oral QPC supper   . argatroban 2 mcg/kg/min (06/28/16 0500)  . dextrose 75 mL/hr at 06/28/16 0105    Assessment/Plan Partial SBO with hx of with inflammatory changes with thickening and edema distal ileum (hospitalized 11/26-12/4/17, surgery planned 06/09/16,  and he opened up at that point) Stage IV lung cancer Diabetes insipidus  Pulmonary embolus Hypernatremia Hx of chronic DCHF FEN: IV fluids/ clear diet ID:  Currently on  No antibiotics DVT:  argatroban    Plan:  Full liquids, pt reports he wants to go to [facility], from here.  I don't know if he would meet criteria, but I will order PT and OT.  Defer to Dr. Dyann Kief. Mobilize more today.  LOS: 4 days    Jianna Drabik 06/28/2016  [phone]  

## 2016-06-28 NOTE — Progress Notes (Signed)
ANTICOAGULATION CONSULT NOTE - Follow Up Consult  Pharmacy Consult for Apixaban  Indication: DVT/PE  Patient Measurements: Height: '5\' 10"'$  (177.8 cm) Weight: 193 lb 12.6 oz (87.9 kg) IBW/kg (Calculated) : 73  Vital Signs: Temp: 98.5 F (36.9 C) (12/19 1513) Temp Source: Oral (12/19 1513) BP: 108/81 (12/19 1513) Pulse Rate: 122 (12/19 1513)  Labs:  Recent Labs  06/26/16 0434  06/26/16 1149  06/26/16 2315 06/27/16 0514 06/28/16 0535  HGB  --   < > 9.8*  --   --  8.7* 8.9*  HCT  --   --  29.0*  --   --  27.0* 27.4*  PLT  --   --  111*  --   --  88* 68*  APTT 38*  < > 35  < > 53* 53* 57*  CREATININE 1.06  --   --   --   --  1.00 0.89  < > = values in this interval not displayed.  Estimated Creatinine Clearance: 80.1 mL/min (by C-G formula based on SCr of 0.89 mg/dL).   Medications:  Infusions:  . dextrose 75 mL/hr at 06/28/16 1526    Assessment: 53 yoM admitted on 12/15 on apixaban with new PE.  Pharmacy was consulted to dose argatroban IV. PMH is significant for metastatic lung cancer and chronic anticoagulation for history of PE/DVT.   Recent admission 11/26-12/4 for SBO. During that admission, Xarelto was held on 11/26 for NPO status and in case of surgical intervention. He was started on IV Heparin on 11/28, but during the course of heparin, his Hgb trended down and Pltc trended down (baseline Pltc 120-170's, Pltc68 on discharge). Heparin infusion was active from 11/28-12/1, but was held for low platelets on 12/1. No bleeding or complications were noted. HIT panel not ordered at that time. Xarelto was resumed on 12/3 and he was discharged on 12/4. Patient was re-admitted on 12/10 and was placed on Argatroban drip for PE until HIT ruled out. Heparin antibody came back slightly elevated, however SRA negative. Patient transitioned to Apixaban per Oncology recommendations and discharged on 12/13.  Last dose of Apixaban '10mg'$  PO PTA was 12/15 at 0800. FOBT +.    12/19  PM:  Pharmacy is now consulted to resume Apixaban dosing.    Most recent aPTT 57 on Argatroban infusion was within therapeutic range.  CBC: Hgb 8.9 remains low but stable.  Plt 68 remain low and decreased.  SCr 0.89, CrCl ~ 80 ml/min  LFTs WNL (12/15)  Goal of Therapy:  Monitor platelets by anticoagulation protocol: Yes   Plan:  Stop Argatroban infusion at 1630. Resume Apixaban '5mg'$  PO BID, first dose at 1830. Continue to monitor renal function and CBC.  Gretta Arab PharmD, BCPS Pager [phone] 06/28/2016 4:31 PM

## 2016-06-28 NOTE — Progress Notes (Signed)
Nutrition Follow-up  DOCUMENTATION CODES:   Non-severe (moderate) malnutrition in context of acute illness/injury  INTERVENTION:  - Will order Magic Cup BID with meals, each supplement provides 290 kcal and 9 grams of protein - Continue to advance diet as medically feasible. - RD will continue to monitor for additional nutrition-related needs.  NUTRITION DIAGNOSIS:   Increased nutrient needs related to cancer and cancer related treatments as evidenced by estimated needs. -ongoing  GOAL:   Patient will meet greater than or equal to 90% of their needs -unmet with recent diet advancement.  MONITOR:   PO intake, Supplement acceptance, Diet advancement, Weight trends, Labs, Skin, I & O's  ASSESSMENT:   75 y.o. male with medical history significant of recently diagnosed stage IV NSCLC (80% PDL1+) being treated with pembrolizomab (Keytruda, immunotherapy), recent SBO (which resolved with conservative management), DVT, multifocal PE (with possible HIT) on Eliquis now, GERD, multiple myeloma with skull lesion (s/p of radiation therapy 2016), chronic prostatitis, BPH, dCHF and diabetes insipidus on DDAVP, who presents with shortness of breath abdominal pain. Found to have recurrent partial SBO. Patient also reported intermittent episodes of chest discomfort (especially with deep breathing).   12/19 Pt's diet advanced from CLD to [place] today at 0935. No intakes documented since previous assessment. He reports eating a cup of ice cream without issue shortly before RD visit. NGT was clamped and removed yesterday. [name].   [name] Provided him with handout outlining FLD and will monitor if further diet education is needed prior to d/c. [name]. He does not like Ensure but is interested in [employer] supplements. Pt also confirmed with RD that it is okay for visitors to bring  him milkshakes.   Weight from 12/17-12/18 was -3.6 kg. Will continue to closely monitor weight trends with NGT now out and pt receiving IVF.  Medications reviewed; 1000 units vitamin D/day, PRN IV Zofran, 40 mg IV Protonix BID.  Labs reviewed; CBGs: 116 and 124 mg/dL today, Na: 154 mmol/L, Cl: 116 mmol/L.  IVF: D5 @ 75 mL/hr (306 kcal).     12/17 - [name]. Output: 750 ml.  - [name]  - [name]  - Currently on CLD for comfort.  - Briefly discussed protein options but pt states he does not like Ensure supplements.  - Pt sipping on some hot tea during visit.  - States he has had 4 BMs today.  - Denies swallowing problems or taste changes.  - States he has a tooth that is bothering him so he has difficulty chewing.  - States he relies on milkshakes and puddings mainly.  - Per chart review, pt's weight is increased 8 lb since 12/10. May be fluid related.  - Pt states his UBW is 203-204 lb.  - Nutrition-Focused physical exam completed. Findings are mild fat depletion, mild muscle depletion, and mild edema.    Diet Order:  Diet full liquid Room service appropriate? Yes; Fluid consistency: Thin  Skin:  Wound (see comment) (R foot non-pressure wound)  Last BM:  12/17  Height:   Ht Readings from Last 1 Encounters:  06/24/16 '5\' 10"'$  (1.778 m)    Weight:   Wt Readings from Last 1 Encounters:  06/27/16 193  lb 12.6 oz (87.9 kg)    Ideal Body Weight:  75.5 kg  BMI:  Body mass index is 27.81 kg/m.  Estimated Nutritional Needs:   Kcal:  2200-2400  Protein:  115-125g  Fluid:  2.2L/day  EDUCATION NEEDS:   No education needs identified at this time    Jarome Matin, MS, RD, LDN, CNSC Inpatient Clinical Dietitian Pager # [phone] After hours/weekend pager # [phone]

## 2016-06-28 NOTE — Progress Notes (Signed)
ANTICOAGULATION CONSULT NOTE - f/u Consult  Pharmacy Consult for Argatroban Indication: pulmonary embolus  Allergies  Allergen Reactions  . Tramadol Palpitations    "Made my chest feel tight"  . Amoxicillin     REACTION: rash  . Penicillins Rash    Has patient had a PCN reaction causing immediate rash, facial/tongue/throat swelling, SOB or lightheadedness with hypotension: unknown Has patient had a PCN reaction causing severe rash involving mucus membranes or skin necrosis: unknown Has patient had a PCN reaction that required hospitalization: no  Has patient had a PCN reaction occurring within the last 10 years: no  If all of the above answers are "NO", then may proceed with Cephalosporin use.     Patient Measurements: Height: '5\' 10"'$  (177.8 cm) Weight: 193 lb 12.6 oz (87.9 kg) IBW/kg (Calculated) : 73  Vital Signs: Temp: 98.5 F (36.9 C) (12/19 0637) Temp Source: Oral (12/19 0637) BP: 109/66 (12/19 0637) Pulse Rate: 90 (12/19 0637)  Labs:  Recent Labs  06/26/16 0434  06/26/16 1149  06/26/16 2315 06/27/16 0514 06/28/16 0535  HGB  --   < > 9.8*  --   --  8.7* 8.9*  HCT  --   --  29.0*  --   --  27.0* 27.4*  PLT  --   --  111*  --   --  88* 68*  APTT 38*  < > 35  < > 53* 53* 57*  CREATININE 1.06  --   --   --   --  1.00 0.89  < > = values in this interval not displayed.  Estimated Creatinine Clearance: 80.1 mL/min (by C-G formula based on SCr of 0.89 mg/dL).  Assessment: 56 yoM admitted on 12/15 with PE and pharmacy is consulted to dose argatroban IV.  PMH is significant for metastatic lung cancer and chronic anticoagulation for history of PE/DVT.  Recent admission 11/26-12/4 for SBO.  During that admission, Xarelto was held on 11/26 for NPO status and in case of surgical intervention.  He was started on IV Heparin on 11/28, but during the course of heparin, his Hgb trended down and Pltc trended down (baseline Pltc 120-170's, Pltc 68 on discharge). Heparin infusion  was active from 11/28-12/1, but was held for low platelets on 12/1. No bleeding or complications were noted.  HIT panel not ordered at that time.  Xarelto was resumed on 12/3 and he was discharged on 12/4. Patient was re-admitted on 12/10 and was placed on Argatroban drip for PE until HIT ruled out. Heparin antibody came back slightly elevated, however SRA negative. Patient transitioned to Apixaban per Oncology recommendations and discharged on 12/13.   On 12/15, Patient presents with abdominal pain. CT scan shows recurrent SBO and mildly progressive RLL PE.   Last dose of Apixaban '10mg'$  PO PTA was 12/15 at 0800. FOBT +.    06/28/2016    aPTT= 57 sec on 70mg/kg/min, no infusion problems or bleeding per RN  Platelets 68  Goal of Therapy:  aPTT 50-70 seconds (per Dr. NBlaine Hamper would like to target lower end of goal range secondary to low Hgb, FOBT + on admission) Monitor platelets by anticoagulation protocol: Yes   Plan:   Continue argatroban at  2 mcg/kg/min (11 ml/hr) (targeting lower end of goal range per MD request).   Daily  aPTT  CBC at least daily while on argatroban  Monitor closely for s/sx of bleeding.  F/u resuming oral anticoagulation if surgery not needed  EDolly RiasRPh 06/28/2016, 8:54  AM Pager [phone]

## 2016-06-28 NOTE — Progress Notes (Signed)
[facility] PROGRESS NOTE  [name] JSE:[id number] DOB: 10-12-1940 DOA: 06/24/2016 PCP: [name], MD  Interim summary and HPI 75 y.o. male with medical history significant of recently diagnosed stage IV NSCLC (80% PDL1+) being treated with pembrolizomab (Keytruda, immunotherapy), recent SBO (which resolved with conservative management), DVT, multifocal PE (with possible HIT) on Eliquis now, GERD, multiple myeloma with skull lesion (s/p of radiation therapy 2016), chronic prostatitis, BPH, dCHF and diabetes insipidus on DDAVP, who presents with shortness of breath abdominal pain. Found to have recurrent partial SBO. Patient also reported intermittent episodes of chest discomfort (especially with deep breathing). Patient was FOBT in ED.  Assessment/Plan: Partial SBO: this is recurrent partial small bowel obstruction. General surgeon, Dr. Malachi Paradise was consulted-->concerns for carcinomatosis.  -will continue conservative management for stabilization -NGT removed on 12/18 and patient tolerated CLD. Planning to advance to full liquid diet today. -no further BM's; but passing gas -general surgery on board and not anticipating surgery; ok to resume PO meds. Highly appreciate general surgery recommendations -will replete electrolytes as needed and provide PRN antiemetics and analgesics.  Pulmonary Emboli: -was on IV argatroban per pharmacy while NPO -will resume Eliquis -patient with recent 2-D echo on recent admission for PE and suggested mild right heart strain. There is no signs of fluid overload and currently hemodynamically stable. Will hold on repeat echo and continue anticoagulation therapy. -Discussed with Dr. [name]  Positive FOBT: Patient does not seem to have significant GI bleeding since hemoglobin is stable. -will continue pantoprazole and will follow Hgb trend  -blood transfusion as needed   Stage IV Lung Cancer: patient actively followed by Dr. [name]. On  immunotherapy, last dose of Lars Mage was 6 weeks ago per pt.  -continue outpatient f/u with Dr. [name]  Chronic diastolic CHF: 2-D echo on 06/2016 showed EF 60-65 percent with grade 1 diastolic dysfunction.  -no crackles on exam and no vascular congestion seen on his CXR -appears to be compensated overall -will follow volume status and daily weight -IVF's rate reduced now that tolerating PO's better  Hyponatremia/hypernatremia: sodium was 126 on admission; now 151-154. -will continue D5W -will resume DDAVP and follow electrolytes -currently asymptomatic and stable (anticiopating will start coming down)  Headache: most likely due to primary metastasized disease. MRI of brain on 05/12/16 showed metastasized disease. -per Dr. [name] no further decadron. -follow any rec's from oncology service -continue PRN pain meds  Hypokalemia: K= 3.3 on admission. -repleted and WNL now -will monitor and replete as needed   Malnutrition of moderate degree: -nutrition consult requested -will need feeding supplements once clear to have diet advance   GERD: -continue PPI  BPH: -will resume flomax -will monitor closely for signs of urinary retention; no issues at this moment   Physical deconditioning -will ask PT to evaluate  Code Status: Full Family Communication: no family at bedside  Disposition Plan: will remains inpatient; will advance diet and transition medications to PO. Follow clinical response   Consultants:  General surgery  PCCM  Procedures:  See below for x-ray reports   Antibiotics:  levaquin 12/13>>12/16  HPI/Subjective: Afebrile, no CP or SOB currently. Patient reports abd discomfort has completely resolved. actively passing gas and tolerating Full liquid diet today. NGT removed on 12/18. No nausea or vomiting.   Objective: Vitals:   06/28/16 0637 06/28/16 1513  BP: 109/66 108/81  Pulse: 90 (!) 122  Resp: 17 20  Temp: 98.5 F (36.9 C) 98.5 F (36.9  C)    Intake/Output Summary (Last  24 hours) at 06/28/16 1602 Last data filed at 06/28/16 1514  Gross per 24 hour  Intake          2353.97 ml  Output             3128 ml  Net          -774.03 ml   Filed Weights   06/24/16 2200 06/26/16 0500 06/27/16 6389  Weight: 98.3 kg (216 lb 11.4 oz) 91.5 kg (201 lb 11.5 oz) 87.9 kg (193 lb 12.6 oz)    Exam:   General:  Non-septic, denies CP and SOB currently. Abdomen feels good and he is passing gas. Reports no BM overnight. Tolerated CLD and will like diet advanced. NGT discontinued on 12/18  Cardiovascular: S1 and S2, no rubs, no gallops, no murmrus  Respiratory: good air movement, no crackles, no wheezing   Abdomen: no guarding, no distension; no abd pain and positive BS  Musculoskeletal: trace edema bilaterally, no cyanosis   Data Reviewed: Basic Metabolic Panel:  Recent Labs Lab 06/25/16 0357 06/25/16 0819 06/26/16 0434 06/27/16 0514 06/28/16 0535  NA 133* 136 151* 151* 154*  K 4.0 4.0 3.6 3.7 3.6  CL 99* 104 116* 115* 116*  CO2 '24 26 27 26 31  '$ GLUCOSE 140* 132* 109* 152* 134*  BUN '10 9 10 '$ <5* 6  CREATININE 0.94 0.90 1.06 1.00 0.89  CALCIUM 8.8* 9.0 9.5 8.8* 8.9  MG 1.5*  --   --   --   --    Liver Function Tests:  Recent Labs Lab 06/24/16 1622  AST 18  ALT 24  ALKPHOS 70  BILITOT 0.6  PROT 5.2*  ALBUMIN 2.5*    Recent Labs Lab 06/24/16 1622  LIPASE 21   CBC:  Recent Labs Lab 06/24/16 1622  06/25/16 0357 06/25/16 0819 06/26/16 1149 06/27/16 0514 06/28/16 0535  WBC 5.6  < > 4.3 4.3 3.5* 3.4* 4.1  NEUTROABS 5.0  --   --   --   --   --   --   HGB 9.2*  < > 8.7* 9.0* 9.8* 8.7* 8.9*  HCT 25.3*  < > 24.3* 25.6* 29.0* 27.0* 27.4*  MCV 90.4  < > 92.0 92.4 98.3 100.0 101.1*  PLT 95*  < > 86* 90* 111* 88* 68*  < > = values in this interval not displayed. Cardiac Enzymes:  Recent Labs Lab 06/24/16 1622  TROPONINI <0.03   BNP (last 3 results)  Recent Labs  06/05/16 2354 06/19/16 1210  06/24/16 2151  BNP 45.8 33.6 94.7   CBG:  Recent Labs Lab 06/25/16 0750 06/26/16 0816 06/27/16 0804 06/28/16 0742  GLUCAP 129* 117* 124* 116*    Recent Results (from the past 240 hour(s))  MRSA PCR Screening     Status: None   Collection Time: 06/19/16  4:10 PM  Result Value Ref Range Status   MRSA by PCR NEGATIVE NEGATIVE Final    Comment:        The GeneXpert MRSA Assay (FDA approved for NASAL specimens only), is one component of a comprehensive MRSA colonization surveillance program. It is not intended to diagnose MRSA infection nor to guide or monitor treatment for MRSA infections.   Culture, blood (single)     Status: None (Preliminary result)   Collection Time: 06/24/16 10:01 PM  Result Value Ref Range Status   Specimen Description BLOOD RIGHT ANTECUBITAL  Final   Special Requests BOTTLES DRAWN AEROBIC AND ANAEROBIC 5ML EA  Final   Culture  Final    NO GROWTH 3 DAYS Performed at [facility]    Report Status PENDING  Incomplete  MRSA PCR Screening     Status: None   Collection Time: 06/25/16 12:27 AM  Result Value Ref Range Status   MRSA by PCR NEGATIVE NEGATIVE Final    Comment:        The GeneXpert MRSA Assay (FDA approved for NASAL specimens only), is one component of a comprehensive MRSA colonization surveillance program. It is not intended to diagnose MRSA infection nor to guide or monitor treatment for MRSA infections.      Studies: Dg Abd 1 View  Result Date: 06/27/2016 CLINICAL DATA:  Small-bowel obstruction EXAM: ABDOMEN - 1 VIEW COMPARISON:  05/26/2016 FINDINGS: Nasogastric catheter remains in the stomach. Scattered large and small bowel gas is noted. Contrast from recent CT examination now lies within the distal colon. The degree of small bowel obstruction has resolved. No free air is seen.Degenerative changes of the lumbar spine are noted. IMPRESSION: Scattered large and small bowel gas. No definitive obstructive changes are  seen. Electronically Signed   By: Inez Catalina M.D.   On: 06/27/2016 08:37    Scheduled Meds: . acidophilus  1 capsule Oral Daily  . cholecalciferol  1,000 Units Oral QHS  . desmopressin  0.1 mg Oral QHS  . mouth rinse  15 mL Mouth Rinse BID  . pantoprazole  40 mg Oral BID  . sodium chloride flush  3 mL Intravenous Q12H  . tamsulosin  0.4 mg Oral QPC supper   Continuous Infusions: . dextrose 75 mL/hr at 06/28/16 1526    Principal Problem:   SBO (small bowel obstruction) Active Problems:   Hyponatremia   History of pulmonary embolism   Acute deep vein thrombosis (DVT) of tibial vein of right lower extremity (HCC)   Headache   Long term current use of anticoagulant therapy   Brain metastases (HCC)   Adenocarcinoma of right lung, stage 4 (HCC)   Hypokalemia   Thrombocytopenia (HCC)   Malnutrition of moderate degree   GERD (gastroesophageal reflux disease)   Chronic diastolic CHF (congestive heart failure) ([place])   GIB (gastrointestinal bleeding)   Benign prostatic hyperplasia without lower urinary tract symptoms    Time spent: 30 minutes    Barton Dubois  [facility] Pager [phone]. If 7PM-7AM, please contact night-coverage at [email or web address], password [facility] 06/28/2016, 4:02 PM  LOS: 4 days

## 2016-06-28 NOTE — Care Management Important Message (Signed)
Important Message  Patient Details  Name: [name] MRN: [id number] Date of Birth: 1941-03-17   Medicare Important Message Given:  Yes    Kerin Salen 06/28/2016, 10:01 AMImportant Message  Patient Details  Name: [name] MRN: [id number] Date of Birth: 11-06-1940   Medicare Important Message Given:  Yes    Kerin Salen 06/28/2016, 10:01 AM

## 2016-06-29 ENCOUNTER — Inpatient Hospital Stay (HOSPITAL_COMMUNITY): Payer: Medicare Other

## 2016-06-29 DIAGNOSIS — A419 Sepsis, unspecified organism: Secondary | ICD-10-CM

## 2016-06-29 DIAGNOSIS — I959 Hypotension, unspecified: Secondary | ICD-10-CM

## 2016-06-29 LAB — BASIC METABOLIC PANEL
ANION GAP: 9 (ref 5–15)
Anion gap: 7 (ref 5–15)
BUN: 10 mg/dL (ref 6–20)
BUN: 17 mg/dL (ref 6–20)
CALCIUM: 8.2 mg/dL — AB (ref 8.9–10.3)
CALCIUM: 7.9 mg/dL — AB (ref 8.9–10.3)
CHLORIDE: 112 mmol/L — AB (ref 101–111)
CO2: 29 mmol/L (ref 22–32)
CO2: 26 mmol/L (ref 22–32)
CREATININE: 1.12 mg/dL (ref 0.61–1.24)
Chloride: 114 mmol/L — ABNORMAL HIGH (ref 101–111)
Creatinine, Ser: 1.1 mg/dL (ref 0.61–1.24)
GFR calc Af Amer: >60 mL/min (ref >60)
GFR calc Af Amer: >60 mL/min (ref >60)
GFR calc non Af Amer: >60 mL/min (ref >60)
GLUCOSE: 159 mg/dL — AB (ref 65–99)
Glucose, Bld: 155 mg/dL — ABNORMAL HIGH (ref 65–99)
Potassium: 3.1 mmol/L — ABNORMAL LOW (ref 3.5–5.1)
Potassium: 3.5 mmol/L (ref 3.5–5.1)
SODIUM: 147 mmol/L — AB (ref 135–145)
Sodium: 150 mmol/L — ABNORMAL HIGH (ref 135–145)

## 2016-06-29 LAB — URINALYSIS, ROUTINE W REFLEX MICROSCOPIC
Bilirubin Urine: NEGATIVE
Glucose, UA: NEGATIVE mg/dL
Hgb urine dipstick: NEGATIVE
KETONES UR: NEGATIVE mg/dL
LEUKOCYTES UA: NEGATIVE
NITRITE: NEGATIVE
PH: 7 (ref 5.0–8.0)
PROTEIN: NEGATIVE mg/dL
Specific Gravity, Urine: 1.014 (ref 1.005–1.030)

## 2016-06-29 LAB — CBC
HCT: 27.9 % — ABNORMAL LOW (ref 39.0–52.0)
HCT: 26.5 % — ABNORMAL LOW (ref 39.0–52.0)
Hemoglobin: 9 g/dL — ABNORMAL LOW (ref 13.0–17.0)
Hemoglobin: 8.7 g/dL — ABNORMAL LOW (ref 13.0–17.0)
MCH: 31.7 pg (ref 26.0–34.0)
MCH: 32.8 pg (ref 26.0–34.0)
MCHC: 32.3 g/dL (ref 30.0–36.0)
MCHC: 32.8 g/dL (ref 30.0–36.0)
MCV: 98.2 fL (ref 78.0–100.0)
MCV: 100 fL (ref 78.0–100.0)
PLATELETS: 58 10*3/uL — AB (ref 150–400)
PLATELETS: 56 10*3/uL — AB (ref 150–400)
RBC: 2.84 MIL/uL — ABNORMAL LOW (ref 4.22–5.81)
RBC: 2.65 MIL/uL — AB (ref 4.22–5.81)
RDW: 18.6 % — AB (ref 11.5–15.5)
RDW: 18.9 % — ABNORMAL HIGH (ref 11.5–15.5)
WBC: 7.2 10*3/uL (ref 4.0–10.5)
WBC: 8.1 10*3/uL (ref 4.0–10.5)

## 2016-06-29 LAB — BLOOD CULTURE ID PANEL (REFLEXED)
Acinetobacter baumannii: NOT DETECTED
CANDIDA KRUSEI: NOT DETECTED
Candida albicans: NOT DETECTED
Candida glabrata: NOT DETECTED
Candida parapsilosis: NOT DETECTED
Candida tropicalis: NOT DETECTED
ESCHERICHIA COLI: NOT DETECTED
Enterobacter cloacae complex: NOT DETECTED
Enterobacteriaceae species: NOT DETECTED
Enterococcus species: NOT DETECTED
Haemophilus influenzae: NOT DETECTED
Klebsiella oxytoca: NOT DETECTED
Klebsiella pneumoniae: NOT DETECTED
Listeria monocytogenes: NOT DETECTED
NEISSERIA MENINGITIDIS: NOT DETECTED
PROTEUS SPECIES: NOT DETECTED
PSEUDOMONAS AERUGINOSA: NOT DETECTED
SERRATIA MARCESCENS: NOT DETECTED
STAPHYLOCOCCUS AUREUS BCID: NOT DETECTED
STAPHYLOCOCCUS SPECIES: NOT DETECTED
STREPTOCOCCUS PNEUMONIAE: NOT DETECTED
Streptococcus agalactiae: NOT DETECTED
Streptococcus pyogenes: NOT DETECTED
Streptococcus species: NOT DETECTED

## 2016-06-29 LAB — GLUCOSE, CAPILLARY: Glucose-Capillary: 119 mg/dL — ABNORMAL HIGH (ref 65–99)

## 2016-06-29 LAB — LACTIC ACID, PLASMA
LACTIC ACID, VENOUS: 2.5 mmol/L — AB (ref 0.5–1.9)
Lactic Acid, Venous: 2.3 mmol/L (ref 0.5–1.9)

## 2016-06-29 MED ORDER — SODIUM CHLORIDE 0.9 % IV BOLUS (SEPSIS)
500.0000 mL | Freq: Once | INTRAVENOUS | Status: AC
  Administered 2016-06-29: 500 mL via INTRAVENOUS

## 2016-06-29 MED ORDER — DEXTROSE-NACL 5-0.9 % IV SOLN: INTRAVENOUS | Status: DC

## 2016-06-29 MED ORDER — SODIUM CHLORIDE 0.9 % IV SOLN: 30.0000 meq | Freq: Once | INTRAVENOUS | Status: DC

## 2016-06-29 MED ORDER — LEVOFLOXACIN IN D5W 750 MG/150ML IV SOLN
750.0000 mg | INTRAVENOUS | Status: DC
  Administered 2016-06-29: 750 mg via INTRAVENOUS
  Filled 2016-06-29: qty 150

## 2016-06-29 MED ORDER — SODIUM CHLORIDE 0.9% FLUSH
10.0000 mL | Freq: Two times a day (BID) | INTRAVENOUS | Status: DC
  Administered 2016-06-29: 20 mL
  Administered 2016-06-29: 30 mL
  Administered 2016-06-30 – 2016-07-05 (×9): 10 mL
  Administered 2016-07-06: 20 mL
  Administered 2016-07-06 – 2016-07-12 (×6): 10 mL

## 2016-06-29 MED ORDER — POTASSIUM CHLORIDE CRYS ER 20 MEQ PO TBCR: 20.0000 meq | EXTENDED_RELEASE_TABLET | Freq: Every day | ORAL | Status: DC

## 2016-06-29 MED ORDER — SODIUM CHLORIDE 0.9% FLUSH
10.0000 mL | INTRAVENOUS | Status: DC | PRN
  Administered 2016-07-08: 10 mL
  Filled 2016-06-29: qty 40

## 2016-06-29 MED ORDER — POTASSIUM CHLORIDE 10 MEQ/100ML IV SOLN
10.0000 meq | INTRAVENOUS | Status: AC
  Administered 2016-06-29 (×3): 10 meq via INTRAVENOUS
  Filled 2016-06-29 (×3): qty 100

## 2016-06-29 MED ORDER — SODIUM CHLORIDE 0.9 % IV SOLN
1.0000 g | Freq: Three times a day (TID) | INTRAVENOUS | Status: DC
  Administered 2016-06-29 – 2016-07-07 (×24): 1 g via INTRAVENOUS
  Filled 2016-06-29 (×24): qty 1

## 2016-06-29 MED ORDER — SODIUM CHLORIDE 0.9 % IV SOLN: INTRAVENOUS | Status: DC

## 2016-06-29 MED ORDER — NOREPINEPHRINE BITARTRATE 1 MG/ML IV SOLN
2.0000 ug/min | INTRAVENOUS | Status: DC
  Administered 2016-06-29: 2 ug/min via INTRAVENOUS
  Administered 2016-06-30: 13 ug/min via INTRAVENOUS
  Administered 2016-06-30: 12 ug/min via INTRAVENOUS
  Administered 2016-06-30: 8 ug/min via INTRAVENOUS
  Administered 2016-06-30 – 2016-07-01 (×2): 7 ug/min via INTRAVENOUS
  Administered 2016-07-02: 6 ug/min via INTRAVENOUS
  Administered 2016-07-02: 3 ug/min via INTRAVENOUS
  Administered 2016-07-03 (×2): 7 ug/min via INTRAVENOUS
  Administered 2016-07-04: 8 ug/min via INTRAVENOUS
  Administered 2016-07-04: 7 ug/min via INTRAVENOUS
  Administered 2016-07-05: 5 ug/min via INTRAVENOUS
  Filled 2016-06-29 (×16): qty 4

## 2016-06-29 MED ORDER — IOPAMIDOL (ISOVUE-300) INJECTION 61%
INTRAVENOUS | Status: AC
  Filled 2016-06-29: qty 30

## 2016-06-29 MED ORDER — DEXTROSE IN LACTATED RINGERS 5 % IV SOLN
INTRAVENOUS | Status: DC
  Administered 2016-06-29 – 2016-06-30 (×2): via INTRAVENOUS

## 2016-06-29 MED ORDER — HYDROCORTISONE NA SUCCINATE PF 100 MG IJ SOLR
100.0000 mg | Freq: Three times a day (TID) | INTRAMUSCULAR | Status: DC
  Administered 2016-06-29 – 2016-07-08 (×27): 100 mg via INTRAVENOUS
  Filled 2016-06-29 (×27): qty 2

## 2016-06-29 MED ORDER — IOPAMIDOL (ISOVUE-300) INJECTION 61%: 30.0000 mL | Freq: Once | INTRAVENOUS | Status: DC | PRN

## 2016-06-29 MED ORDER — LACTATED RINGERS IV SOLN: INTRAVENOUS | Status: DC

## 2016-06-29 MED ORDER — LACTATED RINGERS IV BOLUS (SEPSIS)
1000.0000 mL | Freq: Once | INTRAVENOUS | Status: AC
  Administered 2016-06-29: 1000 mL via INTRAVENOUS

## 2016-06-29 NOTE — Evaluation (Signed)
Physical Therapy Evaluation Patient Details Name: [name] MRN: [id number] DOB: Oct 03, 1940 Today's Date: 06/29/2016   History of Present Illness  [name] is a 75 y.o. male with medical history significant of recently diagnosed stage IV NSCLC (80% PDL1+) being treated with pembrolizomab [name], immunotherapy), recent SBO (thought to be due to adhesions), DVT, multifocal PE (with possible HIT) on Eliquis, Gerd, multiple myeloma with skull lesion (s/p of radiation therapy 2016), chronic prostatitis, BPH, dCHF, diabetes insipidus on DDAVP, who presents with shortness of breath abdominal pain.  Clinical Impression  Pt admitted as above and presenting with functional mobility limitations 2* generalized weakness (L side weaker than R), balance deficits, and orthostatic BP.  Pt is very motivated and could benefit from follow rehab at CIR level.    Follow Up Recommendations CIR    Equipment Recommendations  None recommended by PT    Recommendations for Other Services OT consult     Precautions / Restrictions Precautions Precautions: Fall Restrictions Weight Bearing Restrictions: No      Mobility  Bed Mobility Overal bed mobility: Needs Assistance Bed Mobility: Sit to Supine       Sit to supine: Min assist;+2 for physical assistance;+2 for safety/equipment   General bed mobility comments: pt in chair  Transfers Overall transfer level: Needs assistance Equipment used: Rolling walker (2 wheeled) Transfers: Sit to/from [facility] Sit to Stand: +2 safety/equipment;+2 physical assistance;Mod assist Stand pivot transfers: Mod assist       General transfer comment: cues for transition position and use of UEs to self assist.  Physical assist to bring wt up and fwd and to balance in initial standing  Ambulation/Gait         Gait velocity: decr   General Gait Details: stand pvt chair to bed only 2* BP drop  - BP 82/51 after lying  down  Stairs            Wheelchair Mobility    Modified Rankin (Stroke Patients Only)       Balance Overall balance assessment: Needs assistance Sitting-balance support: No upper extremity supported;Feet supported Sitting balance-Leahy Scale: Fair     Standing balance support: Bilateral upper extremity supported Standing balance-Leahy Scale: Poor                               Pertinent Vitals/Pain Pain Assessment: No/denies pain    Home Living Family/patient expects to be discharged to:: Private residence Living Arrangements: Spouse/significant other Available Help at Discharge: Family Type of Home: House Home Access: Stairs to enter Entrance Stairs-Rails: None Entrance Stairs-Number of Steps: 3 Home Layout: One level Home Equipment: Bedside commode;Walker - 2 wheels;Shower seat - built in      Prior Function Level of Independence: Independent         Comments: Pt walked an hour a day until recently.  Noted decline over past several weeks     Hand Dominance        Extremity/Trunk Assessment   Upper Extremity Assessment Upper Extremity Assessment: Defer to OT evaluation LUE Deficits / Details: LUE is at 3/5 and is weaker than R.  Pt has noticed this in his ADL activity     Lower Extremity Assessment Lower Extremity Assessment: LLE deficits/detail;Generalized weakness LLE Deficits / Details: Strength ~ 3+/5 with noted tremor LLE Coordination: decreased fine motor       Communication   Communication: No difficulties  Cognition Arousal/Alertness: Lethargic Behavior  During Therapy: WFL for tasks assessed/performed Overall Cognitive Status: No family/caregiver present to determine baseline cognitive functioning   Orientation Level: Time;Place       Safety/Judgement: Decreased awareness of safety   Problem Solving: Slow processing;Requires verbal cues General Comments: pt with some confusion during eval but also had low BP- will  continue to evaluate    General Comments      Exercises     Assessment/Plan    PT Assessment Patient needs continued PT services  PT Problem List Decreased strength;Decreased activity tolerance;Decreased balance;Decreased mobility;Decreased cognition;Decreased knowledge of use of DME;Pain;Decreased safety awareness          PT Treatment Interventions DME instruction;Gait training;Stair training;Functional mobility training;Therapeutic activities;Therapeutic exercise;Patient/family education    PT Goals (Current goals can be found in the Care Plan section)  Acute Rehab PT Goals Patient Stated Goal: i may need some rehab PT Goal Formulation: With patient Time For Goal Achievement: 07/13/16 Potential to Achieve Goals: Fair    Frequency Min 3X/week   Barriers to discharge        Co-evaluation PT/OT/SLP Co-Evaluation/Treatment: Yes Reason for Co-Treatment: Complexity of the patient's impairments (multi-system involvement);For patient/therapist safety PT goals addressed during session: Mobility/safety with mobility OT goals addressed during session: ADL's and self-care       End of Session Equipment Utilized During Treatment: Gait belt Activity Tolerance: Other (comment);Patient limited by fatigue (orthostatic) Patient left: in bed;with call bell/phone within reach;with bed alarm set Nurse Communication: Mobility status;Other (comment) (orthostatic BP)         Time: 1020-1050 PT Time Calculation (min) (ACUTE ONLY): 30 min   Charges:   PT Evaluation $PT Eval Moderate Complexity: 1 Procedure     PT G Codes:        Abass Misener 01-Jul-2016, 12:42 PM

## 2016-06-29 NOTE — Progress Notes (Signed)
Pharmacy Antibiotic Note  [name] is a 75 y.o. male admitted on 06/24/2016 with PE, recent admission for SBO also  hx of metastatic lung cancer.   Patient is now growing GNR in blood cx, concern for aspiration.  Levaquin resumed earlier today, however now pharmacy has been consulted for Meropenem dosing.  Plan: Meropenem 1gm IV q8h Follow renal function  Height: '5\' 10"'$  (177.8 cm) Weight: 193 lb 12.6 oz (87.9 kg) IBW/kg (Calculated) : 73  Temp (24hrs), Avg:98.7 F (37.1 C), Min:97.9 F (36.6 C), Max:100.2 F (37.9 C)   Recent Labs Lab 06/24/16 2012  06/25/16 0819 06/26/16 0434 06/26/16 1149 06/27/16 0514 06/28/16 0535 06/29/16 0627 06/29/16 1507  WBC  --   < > 4.3  --  3.5* 3.4* 4.1 7.2  --   CREATININE  --   < > 0.90 1.06  --  1.00 0.89 1.10  --   LATICACIDVEN 0.96  --   --   --   --   --   --   --  2.5*  < > = values in this interval not displayed.  Estimated Creatinine Clearance: 64.8 mL/min (by C-G formula based on SCr of 1.1 mg/dL).    Allergies  Allergen Reactions  . Tramadol Palpitations    "Made my chest feel tight"  . Amoxicillin     REACTION: rash  . Penicillins Rash    Has patient had a PCN reaction causing immediate rash, facial/tongue/throat swelling, SOB or lightheadedness with hypotension: unknown Has patient had a PCN reaction causing severe rash involving mucus membranes or skin necrosis: unknown Has patient had a PCN reaction that required hospitalization: no  Has patient had a PCN reaction occurring within the last 10 years: no  If all of the above answers are "NO", then may proceed with Cephalosporin use.    Antibiotics this Admission: Levaquin 12/16 x1 dose. 12/20 x1 dose Meropenem 12/20>>  Microbiology results: 12/15 BCx x1: GNR (no organisms detected on BCID) 12/16 MRSA PCR: neg  Thank you for allowing pharmacy to be a part of this patient's care.  Netta Cedars, PharmD, BCPS Pager: [phone] 06/29/2016, 3:47 PM

## 2016-06-29 NOTE — Evaluation (Signed)
Occupational Therapy Evaluation Patient Details Name: SHOUA RESSLER MRN: [id number] DOB: [birth year] Today's Date: 06/29/2016    History of Present Illness KALEI MCKILLOP is a 75 y.o. male with medical history significant of recently diagnosed stage IV NSCLC (80% PDL1+) being treated with pembrolizomab [name], immunotherapy), recent SBO (thought to be due to adhesions), DVT, multifocal PE (with possible HIT) on Eliquis, Gerd, multiple myeloma with skull lesion (s/p of radiation therapy 2016), chronic prostatitis, BPH, dCHF, diabetes insipidus on DDAVP, who presents with shortness of breath abdominal pain.   Clinical Impression   Pt admitted with SOB and abdominal pain Pt currently with functional limitations due to the deficits listed below (see OT Problem List).  Pt will benefit from skilled OT to increase their safety and independence with ADL and functional mobility for ADL to facilitate discharge to venue listed below.      Follow Up Recommendations  CIR - pts wife  Is able to A him at home and pt very motivated to participate   Equipment Recommendations  Other (comment) (TBD)    Recommendations for Other Services       Precautions / Restrictions Precautions Precautions: Fall Restrictions Weight Bearing Restrictions: No      Mobility Bed Mobility               General bed mobility comments: pt in chair  Transfers Overall transfer level: Needs assistance Equipment used: Rolling walker (2 wheeled) Transfers: Sit to/from [facility] Sit to Stand: +2 safety/equipment;+2 physical assistance;Mod assist Stand pivot transfers: Mod assist                 ADL Overall ADL's : Needs assistance/impaired Eating/Feeding: Minimal assistance;Sitting   Grooming: Minimal assistance;Sitting   Upper Body Bathing: Minimal assistance;Sitting   Lower Body Bathing: +2 for physical assistance;+2 for safety/equipment;Maximal assistance;Sit to/from  stand;Cueing for safety;Cueing for sequencing;Cueing for compensatory techniques   Upper Body Dressing : Minimal assistance;Sitting;Cueing for sequencing   Lower Body Dressing: Maximal assistance;Sit to/from stand;Cueing for safety;Cueing for sequencing;Cueing for compensatory techniques   Toilet Transfer: Moderate assistance;RW;Ambulation;Cueing for sequencing;Cueing for safety;+2 for safety/equipment;+2 for physical assistance             General ADL Comments: PT and OT Aed back back to bed after pt became nauseous and BP 82/51               Pertinent Vitals/Pain Pain Assessment: No/denies pain     Hand Dominance     Extremity/Trunk Assessment Upper Extremity Assessment Upper Extremity Assessment: LUE deficits/detail LUE Deficits / Details: LUE is at 3/5 and is weaker than R.  Pt has noticed this in his ADL activity            Communication Communication Communication: No difficulties   Cognition Arousal/Alertness: Lethargic   Overall Cognitive Status: No family/caregiver present to determine baseline cognitive functioning           Safety/Judgement: Decreased awareness of safety   Problem Solving: Slow processing;Requires verbal cues General Comments: pt with some confusion during OT eval but also had low BP- will continue to evaluate   General Comments               Home Living Family/patient expects to be discharged to:: Private residence Living Arrangements: Spouse/significant other Available Help at Discharge: Family Type of Home: House Home Access: Stairs to enter [employer] of Steps: 3   Home Layout: One level  Bathroom Toilet: Standard     Home Equipment: Bedside commode;Walker - 2 wheels;Shower seat - built in          Prior Functioning/Environment          Comments: Pt walked an hour a day prior to hospital admit        OT Problem List: Decreased strength;Decreased activity tolerance;Impaired UE  functional use;Decreased safety awareness;Impaired balance (sitting and/or standing);Decreased cognition   OT Treatment/Interventions: Self-care/ADL training;DME and/or AE instruction;Patient/family education    OT Goals(Current goals can be found in the care plan section) Acute Rehab OT Goals Patient Stated Goal: i may need some rehab OT Goal Formulation: With patient Time For Goal Achievement: 07/13/16  OT Frequency: Min 2X/week   Barriers to D/C:            Co-evaluation              End of Session Nurse Communication: Mobility status;Other (comment) (low BP )  Activity Tolerance: Treatment limited secondary to medical complications (Comment) Patient left: in bed;with call bell/phone within reach;with bed alarm set   Time: 3664-4034 OT Time Calculation (min): 34 min Charges:  OT General Charges $OT Visit: 1 Procedure OT Evaluation $OT Eval Moderate Complexity: 1 Procedure G-Codes:    Payton Mccallum D 07/08/2016, 11:16 AM

## 2016-06-29 NOTE — Progress Notes (Signed)
Rehab admissions - I have been asked to review for potential acute inpatient rehab admission.  I will see patient in am.  Call me for questions.  #[phone]

## 2016-06-29 NOTE — Progress Notes (Signed)
[facility] Progress Note Patient Name: [name] DOB: 1941/05/29 MRN: [id number]   Date of Service  06/29/2016  HPI/Events of Note  Called and notified that pt had a bloody stool. Note CT results from earlier this evening, evidence for colitis and a SBO.   eICU Interventions  Recheck CBC, follow hemodynamics. Consider re-involving SGY if he decompensates     Intervention Category Intermediate Interventions: Bleeding - evaluation and treatment with blood products  Jaxsun Ciampi S. 06/29/2016, 7:39 PM

## 2016-06-29 NOTE — Progress Notes (Signed)
Pt vital signs still with decreased BP and increased HR.  C/o nausea with some abdominal cramping.  MD at bedside to speak with pt and wife.  Orders to transfer to stepdown for close observation.

## 2016-06-29 NOTE — Progress Notes (Signed)
PHARMACY - PHYSICIAN COMMUNICATION CRITICAL VALUE ALERT - BLOOD CULTURE IDENTIFICATION (BCID)  Results for orders placed or performed during the hospital encounter of 06/24/16  Blood Culture ID Panel (Reflexed) (Collected: 06/24/2016 10:01 PM)  Result Value Ref Range   Enterococcus species NOT DETECTED NOT DETECTED   Listeria monocytogenes NOT DETECTED NOT DETECTED   Staphylococcus species NOT DETECTED NOT DETECTED   Staphylococcus aureus NOT DETECTED NOT DETECTED   Streptococcus species NOT DETECTED NOT DETECTED   Streptococcus agalactiae NOT DETECTED NOT DETECTED   Streptococcus pneumoniae NOT DETECTED NOT DETECTED   Streptococcus pyogenes NOT DETECTED NOT DETECTED   Acinetobacter baumannii NOT DETECTED NOT DETECTED   Enterobacteriaceae species NOT DETECTED NOT DETECTED   Enterobacter cloacae complex NOT DETECTED NOT DETECTED   Escherichia coli NOT DETECTED NOT DETECTED   Klebsiella oxytoca NOT DETECTED NOT DETECTED   Klebsiella pneumoniae NOT DETECTED NOT DETECTED   Proteus species NOT DETECTED NOT DETECTED   Serratia marcescens NOT DETECTED NOT DETECTED   Haemophilus influenzae NOT DETECTED NOT DETECTED   Neisseria meningitidis NOT DETECTED NOT DETECTED   Pseudomonas aeruginosa NOT DETECTED NOT DETECTED   Candida albicans NOT DETECTED NOT DETECTED   Candida glabrata NOT DETECTED NOT DETECTED   Candida krusei NOT DETECTED NOT DETECTED   Candida parapsilosis NOT DETECTED NOT DETECTED   Candida tropicalis NOT DETECTED NOT DETECTED    Name of physician (or Provider) Contacted: K Schorr [id number]  Changes to prescribed antibiotics required: GNR in anaerobic bottle - on Levaquin  Dorrene German 06/29/2016  2:24 AM

## 2016-06-29 NOTE — Progress Notes (Signed)
PROGRESS NOTE    [name]  FXT:[id number] DOB: [birth year] DOA: 06/24/2016 PCP: [name], MD   Brief Narrative: 75 y.o.malewith medical history significant of recently diagnosed stage IV NSCLC (80% PDL1+) being treated with pembrolizomab (Keytruda, immunotherapy), recent SBO (which resolved with conservative management), DVT, multifocal PE (with possible HIT) on Eliquis now, GERD, multiple myeloma with skull lesion (s/p of radiation therapy 2016), chronic prostatitis, BPH, dCHF and diabetes insipidus on DDAVP, who presents with shortness of breath abdominal pain. Found to have recurrent partial SBO. Patient also reported intermittent episodes of chest discomfort (especially with deep breathing). Patient was FOBT in ED.  Assessment & Plan:  # Partial recurrent SBO:  -Evaluated by surgery team recommended conservative management. Patient is able to tolerate liquid diet and reported passing bowel movement. Advance diet to soft as per general surgery. No surgical intervention planned at this time. -Monitor electrolytes and continue conservative management. -Discussed with the general surgery team.  # Sepsis due to Gram-negative rods bacteremia: 1/2 blood culture from December 15 growing gram-negative rods. Patient is currently not on antibiotics. Follow up final culture results. Patient is hypotensive to 72s with intermittent confusion.  -I discussed with the patient, his wife and daughter at bedside regarding deteriorating clinical condition. Also discussed with  Surgeon at bedside.  -Abdomen in benign on exam however given recent SBO, will check CT abdomen pelvis -check lactate, Ua, CXR, repeat blood cultures. -bolus 500 cc of IVF and then maintaining IVF. Watch for fluid overload.  -I will start IV Levaquin since patient is allergic to penicillin. Discussed with the pharmacist. Monitor blood pressure and culture results. -transfer patient to ICU step down unit for close  monitoring. -consulted critical care and discussed with Dr Vaughan Browner  #Pulmonary emboli: As per medical record, patient was on IV argatroban while nothing by mouth. Currently on eliquis.   #Hypernatremia and hypokalemia: -Hypernatremia because of history of diabetes insipidus. DDAVP was resumed since yesterday with decreasing in urine output and slowly improvement in serum sodium level. Repleted potassium chloride IV today and  started oral potassium chloride starting from tomorrow.  #Stage IV lung cancer: patient actively followed by Dr. [name]. On immunotherapy, last dose of Lars Mage was 6 weeks ago per pt.  -continue outpatient f/u with Dr. [name]  #Positive FOBT: Patient does not seem to have significant GI bleeding since hemoglobin is stable. -will continue pantoprazole and will follow Hgb trend   # Chronic diastolic CHF: stable now. Monitor.   # Hypokalemia: repleting KCL IV and oral.   # Moderate protein calorie malnutrition: nutrition consult, has poor intake.   # physical deconditioning;PT OT eval.   Principal Problem:   SBO (small bowel obstruction) Active Problems:   Hyponatremia   History of pulmonary embolism   Acute deep vein thrombosis (DVT) of tibial vein of right lower extremity (HCC)   Headache   Long term current use of anticoagulant therapy   Brain metastases (HCC)   Adenocarcinoma of right lung, stage 4 (HCC)   Hypokalemia   Thrombocytopenia (HCC)   Malnutrition of moderate degree   GERD (gastroesophageal reflux disease)   Chronic diastolic CHF (congestive heart failure) (HCC)   GIB (gastrointestinal bleeding)   Benign prostatic hyperplasia without lower urinary tract symptoms  DVT prophylaxis:systemic AC Code Status: Full code. Discussed goals of care with wife Family Communication: discussed with wife and daughter at length Disposition Plan:transferring to step down  Consultants:    Surgery  PCCM  oncology  Procedures:none Antimicrobials:levaquin on 12/20  Subjective: Patient was seen and examined twice today. First one around 10 am and then around 2:45 PM in the presence of wife and daughter. Reported weakness, nausea, poor appetie. Reported passing gas and bowel. Denied headche, dizziness, chest pain, SOB, cough.   Objective: Vitals:   06/28/16 2023 06/29/16 0617 06/29/16 1109 06/29/16 1141  BP: 118/68 97/60 (!) 82/51 (!) 94/56  Pulse: (!) 106 (!) 109    Resp: 19 (!) 21    Temp: 100.2 F (37.9 C) 97.9 F (36.6 C)    TempSrc: Oral Oral    SpO2: 96% 97%    Weight:      Height:        Intake/Output Summary (Last 24 hours) at 06/29/16 1400 Last data filed at 06/29/16 0300  Gross per 24 hour  Intake             1335 ml  Output              404 ml  Net              931 ml   Filed Weights   06/24/16 2200 06/26/16 0500 06/27/16 1638  Weight: 98.3 kg (216 lb 11.4 oz) 91.5 kg (201 lb 11.5 oz) 87.9 kg (193 lb 12.6 oz)    Examination:  General exam: ill looking male, lying on bed, NADe  Respiratory system: Clear to auscultation. Respiratory effort normal. No wheezing or crackle Cardiovascular system: S1 & S2 heard, RRR.  No pedal edema. Gastrointestinal system: Abdomen is nondistended, soft and nontender. Normal bowel sounds heard. Central nervous system: Alert and oriented. No focal neurological deficits. Extremities: Symmetric 5 x 5 power. Skin: No rashes, lesions or ulcers Psychiatry: Judgement and insight appear normal. Mood & affect appropriate.     Data Reviewed: I have personally reviewed following labs and imaging studies  CBC:  Recent Labs Lab 06/24/16 1622  06/25/16 0819 06/26/16 1149 06/27/16 0514 06/28/16 0535 06/29/16 0627  WBC 5.6  < > 4.3 3.5* 3.4* 4.1 7.2  NEUTROABS 5.0  --   --   --   --   --   --   HGB 9.2*  < > 9.0* 9.8* 8.7* 8.9* 9.0*  HCT 25.3*  < > 25.6* 29.0* 27.0* 27.4* 27.9*  MCV 90.4  < > 92.4 98.3 100.0  101.1* 98.2  PLT 95*  < > 90* 111* 88* 68* 58*  < > = values in this interval not displayed. Basic Metabolic Panel:  Recent Labs Lab 06/25/16 0357 06/25/16 0819 06/26/16 0434 06/27/16 0514 06/28/16 0535 06/29/16 0627  NA 133* 136 151* 151* 154* 150*  K 4.0 4.0 3.6 3.7 3.6 3.1*  CL 99* 104 116* 115* 116* 114*  CO2 '24 26 27 26 31 29  '$ GLUCOSE 140* 132* 109* 152* 134* 159*  BUN '10 9 10 '$ <5* 6 10  CREATININE 0.94 0.90 1.06 1.00 0.89 1.10  CALCIUM 8.8* 9.0 9.5 8.8* 8.9 8.2*  MG 1.5*  --   --   --   --   --    GFR: Estimated Creatinine Clearance: 64.8 mL/min (by C-G formula based on SCr of 1.1 mg/dL). Liver Function Tests:  Recent Labs Lab 06/24/16 1622  AST 18  ALT 24  ALKPHOS 70  BILITOT 0.6  PROT 5.2*  ALBUMIN 2.5*    Recent Labs Lab 06/24/16 1622  LIPASE 21   No results for input(s): AMMONIA in the last 168 hours. Coagulation Profile:  Recent Labs Lab 06/24/16 2151  INR  1.14   Cardiac Enzymes:  Recent Labs Lab 06/24/16 1622  TROPONINI <0.03   BNP (last 3 results) No results for input(s): PROBNP in the last 8760 hours. HbA1C: No results for input(s): HGBA1C in the last 72 hours. CBG:  Recent Labs Lab 06/25/16 0750 06/26/16 0816 06/27/16 0804 06/28/16 0742 06/29/16 1138  GLUCAP 129* 117* 124* 116* 119*   Lipid Profile: No results for input(s): CHOL, HDL, LDLCALC, TRIG, CHOLHDL, LDLDIRECT in the last 72 hours. Thyroid Function Tests: No results for input(s): TSH, T4TOTAL, FREET4, T3FREE, THYROIDAB in the last 72 hours. Anemia Panel: No results for input(s): VITAMINB12, FOLATE, FERRITIN, TIBC, IRON, RETICCTPCT in the last 72 hours. Sepsis Labs:  Recent Labs Lab 06/24/16 2012  LATICACIDVEN 0.96    Recent Results (from the past 240 hour(s))  MRSA PCR Screening     Status: None   Collection Time: 06/19/16  4:10 PM  Result Value Ref Range Status   MRSA by PCR NEGATIVE NEGATIVE Final    Comment:        The GeneXpert MRSA Assay  (FDA approved for NASAL specimens only), is one component of a comprehensive MRSA colonization surveillance program. It is not intended to diagnose MRSA infection nor to guide or monitor treatment for MRSA infections.   Culture, blood (single)     Status: None (Preliminary result)   Collection Time: 06/24/16 10:01 PM  Result Value Ref Range Status   Specimen Description BLOOD RIGHT ANTECUBITAL  Final   Special Requests BOTTLES DRAWN AEROBIC AND ANAEROBIC 5ML EA  Final   Culture  Setup Time   Final    GRAM NEGATIVE RODS ANAEROBIC BOTTLE ONLY Organism ID to follow CRITICAL RESULT CALLED TO, READ BACK BY AND VERIFIED WITH: BETH GREEN, PHARMD '@0150'$  06/29/16 MKELLY,MLT    Culture   Final    CULTURE REINCUBATED FOR BETTER GROWTH Performed at [facility]    Report Status PENDING  Incomplete  Blood Culture ID Panel (Reflexed)     Status: None   Collection Time: 06/24/16 10:01 PM  Result Value Ref Range Status   Enterococcus species NOT DETECTED NOT DETECTED Final   Listeria monocytogenes NOT DETECTED NOT DETECTED Final   Staphylococcus species NOT DETECTED NOT DETECTED Final   Staphylococcus aureus NOT DETECTED NOT DETECTED Final   Streptococcus species NOT DETECTED NOT DETECTED Final   Streptococcus agalactiae NOT DETECTED NOT DETECTED Final   Streptococcus pneumoniae NOT DETECTED NOT DETECTED Final   Streptococcus pyogenes NOT DETECTED NOT DETECTED Final   Acinetobacter baumannii NOT DETECTED NOT DETECTED Final   Enterobacteriaceae species NOT DETECTED NOT DETECTED Final   Enterobacter cloacae complex NOT DETECTED NOT DETECTED Final   Escherichia coli NOT DETECTED NOT DETECTED Final   Klebsiella oxytoca NOT DETECTED NOT DETECTED Final   Klebsiella pneumoniae NOT DETECTED NOT DETECTED Final   Proteus species NOT DETECTED NOT DETECTED Final   Serratia marcescens NOT DETECTED NOT DETECTED Final   Haemophilus influenzae NOT DETECTED NOT DETECTED Final   Neisseria  meningitidis NOT DETECTED NOT DETECTED Final   Pseudomonas aeruginosa NOT DETECTED NOT DETECTED Final   Candida albicans NOT DETECTED NOT DETECTED Final   Candida glabrata NOT DETECTED NOT DETECTED Final   Candida krusei NOT DETECTED NOT DETECTED Final   Candida parapsilosis NOT DETECTED NOT DETECTED Final   Candida tropicalis NOT DETECTED NOT DETECTED Final    Comment: Performed at [facility]  MRSA PCR Screening     Status: None   Collection Time: 06/25/16 12:27  AM  Result Value Ref Range Status   MRSA by PCR NEGATIVE NEGATIVE Final    Comment:        The GeneXpert MRSA Assay (FDA approved for NASAL specimens only), is one component of a comprehensive MRSA colonization surveillance program. It is not intended to diagnose MRSA infection nor to guide or monitor treatment for MRSA infections.          Radiology Studies: No results found.      Scheduled Meds: . acidophilus  1 capsule Oral Daily  . apixaban  5 mg Oral BID  . cholecalciferol  1,000 Units Oral QHS  . desmopressin  0.1 mg Oral QHS  . levofloxacin (LEVAQUIN) IV  750 mg Intravenous Q24H  . mouth rinse  15 mL Mouth Rinse BID  . pantoprazole  40 mg Oral BID  . sodium chloride flush  10-40 mL Intracatheter Q12H  . sodium chloride flush  3 mL Intravenous Q12H  . tamsulosin  0.4 mg Oral QPC supper   Continuous Infusions: . dextrose 75 mL/hr at 06/29/16 0436     LOS: 5 days    Irvan Tiedt Tanna Furry, MD [facility] Pager [phone]  If 7PM-7AM, please contact night-coverage [email or web address] Password [email or web address] 06/29/2016, 2:00 PM

## 2016-06-29 NOTE — Progress Notes (Signed)
Patient ID: [name], male   DOB: 1940-09-19, 75 y.o.   MRN: [id number]  [facility] Surgery Progress Note     Subjective: Tolerating full liquids. Passing flatus and had multiple BM's yesterday. Denies n/v or abdominal pain.  Reports feeling lightheaded when getting OOB this morning with therapy, BP 82/51.   Objective: Vital signs in last 24 hours: Temp:  [97.9 F (36.6 C)-100.2 F (37.9 C)] 97.9 F (36.6 C) (12/20 0617) Pulse Rate:  [106-122] 109 (12/20 0617) Resp:  [19-21] 21 (12/20 0617) BP: (82-118)/(51-81) 94/56 (12/20 1141) SpO2:  [96 %-99 %] 97 % (12/20 0617) Last BM Date: 06/29/16  Intake/Output from previous day: 12/19 0701 - 12/20 0700 In: 2010.5 [P.O.:480; I.V.:1530.5] Out: 1505 [Urine:1501; Stool:4] Intake/Output this shift: No intake/output data recorded.  PE: Gen:  Alert, NAD, pleasant Card:  RRR, no M/G/R heard Pulm:  CTAB, no W/R/R, effort normal Abd: Soft, NT/ND, +BS, no HSM  Lab Results:   Recent Labs  06/28/16 0535 06/29/16 0627  WBC 4.1 7.2  HGB 8.9* 9.0*  HCT 27.4* 27.9*  PLT 68* 58*   BMET  Recent Labs  06/28/16 0535 06/29/16 0627  NA 154* 150*  K 3.6 3.1*  CL 116* 114*  CO2 31 29  GLUCOSE 134* 159*  BUN 6 10  CREATININE 0.89 1.10  CALCIUM 8.9 8.2*   PT/INR No results for input(s): LABPROT, INR in the last 72 hours. CMP     Component Value Date/Time   NA 150 (H) 06/29/2016 0627   NA 132 (L) 06/15/2016 0852   K 3.1 (L) 06/29/2016 0627   K 4.1 06/15/2016 0852   CL 114 (H) 06/29/2016 0627   CL 109 (H) 12/31/2012 0759   CO2 29 06/29/2016 0627   CO2 23 06/15/2016 0852   GLUCOSE 159 (H) 06/29/2016 0627   GLUCOSE 101 06/15/2016 0852   GLUCOSE 116 (H) 12/31/2012 0759   BUN 10 06/29/2016 0627   BUN 19.0 06/15/2016 0852   CREATININE 1.10 06/29/2016 0627   CREATININE 0.8 06/15/2016 0852   CALCIUM 8.2 (L) 06/29/2016 0627   CALCIUM 8.5 06/15/2016 0852   PROT 5.2 (L) 06/24/2016 1622   PROT 5.6 (L) 06/15/2016 0852    ALBUMIN 2.5 (L) 06/24/2016 1622   ALBUMIN 2.8 (L) 06/15/2016 0852   AST 18 06/24/2016 1622   AST 10 06/15/2016 0852   ALT 24 06/24/2016 1622   ALT 27 06/15/2016 0852   ALKPHOS 70 06/24/2016 1622   ALKPHOS 77 06/15/2016 0852   BILITOT 0.6 06/24/2016 1622   BILITOT 0.99 06/15/2016 0852   GFRNONAA >60 06/29/2016 0627   GFRAA >60 06/29/2016 0627   Lipase     Component Value Date/Time   LIPASE 21 06/24/2016 1622       Studies/Results: No results found.  Anti-infectives: Anti-infectives    Start     Dose/Rate Route Frequency Ordered Stop   06/25/16 1000  levofloxacin (LEVAQUIN) tablet 500 mg  Status:  Discontinued     500 mg Oral Daily 06/24/16 2026 06/25/16 0941       Assessment/Plan Partial SBO with hx of with inflammatory changes with thickening and edema distal ileum (hospitalized 11/26-12/4/17, surgery planned 06/09/16,  and he opened up at that point) - tolerating full liquids, passing flatus, multiple BM's yesterday  Stage IV lung cancer Diabetes insipidus  Hypernatremia Pulmonary embolus - on eliquis Hx of chronic dCHF Malnutrition GERD  FEN: IV fluids, soft diet ID:  Currently on no antibiotics DVT:  eliquis  Plan:  advance to soft/low residue diet; would recommend remaining on this indefinitely. Pending PT/OT eval for d/c placement. Spoke with internal medicine who is going to address hypotension. Mobilize more once BP stable.   LOS: 5 days    [name] , [facility] Surgery 06/29/2016, 12:46 PM Pager: [phone] Consults: [phone] Mon-Fri 7:00 am-4:30 pm Sat-Sun 7:00 am-11:30 am

## 2016-06-29 NOTE — Progress Notes (Signed)
Pharmacy Antibiotic Note  [name] is a 75 y.o. male admitted on 06/24/2016 with PE, recent admission for SBO also  hx of metastatic lung cancer.   Pharmacy has been consulted for levaquin dosing for bacteremia.  Plan: levaquin '750mg'$  IV q24h for CrCl > 1ms/min Follow renal function  Height: '5\' 10"'$  (177.8 cm) Weight: 193 lb 12.6 oz (87.9 kg) IBW/kg (Calculated) : 73  Temp (24hrs), Avg:98.9 F (37.2 C), Min:97.9 F (36.6 C), Max:100.2 F (37.9 C)   Recent Labs Lab 06/24/16 2012  06/25/16 0819 06/26/16 0434 06/26/16 1149 06/27/16 0514 06/28/16 0535 06/29/16 0627  WBC  --   < > 4.3  --  3.5* 3.4* 4.1 7.2  CREATININE  --   < > 0.90 1.06  --  1.00 0.89 1.10  LATICACIDVEN 0.96  --   --   --   --   --   --   --   < > = values in this interval not displayed.  Estimated Creatinine Clearance: 64.8 mL/min (by C-G formula based on SCr of 1.1 mg/dL).    Allergies  Allergen Reactions  . Tramadol Palpitations    "Made my chest feel tight"  . Amoxicillin     REACTION: rash  . Penicillins Rash    Has patient had a PCN reaction causing immediate rash, facial/tongue/throat swelling, SOB or lightheadedness with hypotension: unknown Has patient had a PCN reaction causing severe rash involving mucus membranes or skin necrosis: unknown Has patient had a PCN reaction that required hospitalization: no  Has patient had a PCN reaction occurring within the last 10 years: no  If all of the above answers are "NO", then may proceed with Cephalosporin use.      Microbiology results: 12/15 BCx x1: sent 12/16 MRSA PCR: neg 12/20 BCID no identification, but GNR in anaerobic bottle    Thank you for allowing pharmacy to be a part of this patient's care.  EDolly RiasRPh 06/29/2016, 2:02 PM Pager [phone]

## 2016-06-29 NOTE — Progress Notes (Addendum)
PULMONARY / CRITICAL CARE MEDICINE   Name: [name] MRN: [id number] DOB: 07-05-1941    ADMISSION DATE:  06/24/2016 CONSULTATION DATE:  06/29/16  REFERRING MD:  Katheran James, MD  CHIEF COMPLAINT:  Sepsis, gram-negative rod bacteremia  HISTORY OF PRESENT ILLNESS:   75 year old with stage IV adenocarcinoma, arthritis, BPH, DVT. He has diagnosis of stage IVB (T1b, N2, M1c) non-small cell lung cancer, adenocarcinoma with metastases to the brain diagnosed in October 2017. He was undergoing chemotherapy with Prembrolizomab and recent dx of recurrent SBO, PE on eliquis who was admitted on 12/15 with severe abdominal pain. He was assessed by surgery who recommended conservative management with IVF and NG decompression.   On 06/29/16 he developed nausea and projectile vomiting, decreased blood pressure. Noted to have gram-negative rod bacteremia. He was transferred to stepdown for further management and PCCM consulted.  PAST MEDICAL HISTORY :  He  has a past medical history of Adenocarcinoma of right lung, stage 4 ([place]) (05/27/2016); Adenomatous polyp of colon (1996); Arthritis; Arthritis of hand, right; Back ache; Benign prostatic hypertrophy; Degenerative disc disease; Diverticulosis; DVT (deep venous thrombosis) ([place]) (01/2014); Encounter for antineoplastic chemotherapy (10/20/2015); Erosive esophagitis; GERD (gastroesophageal reflux disease); Hemorrhoids; Hiatal hernia; History of chronic prostatitis; History of melanoma; Multiple myeloma ([place]) (2013); Pulmonary embolism ([place]) (01/2014); Radiation (07/21/14-08/05/14); and SCC (squamous cell carcinoma) (08/04/15).  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  Allergies  Allergen Reactions  . Tramadol Palpitations    "Made my chest feel tight"  . Amoxicillin     REACTION: rash  . Penicillins Rash    Has patient had a PCN reaction causing immediate rash, facial/tongue/throat swelling, SOB or lightheadedness with hypotension: unknown Has patient had a PCN reaction causing severe rash involving mucus membranes or skin necrosis: unknown Has patient had a PCN reaction that required hospitalization: no  Has patient had a PCN reaction occurring within the last 10 years: no  If all of the above answers are "NO", then may proceed with Cephalosporin use.     No current facility-administered medications on file prior to encounter.    Current Outpatient Prescriptions on File Prior to Encounter  Medication Sig  . acetaminophen (TYLENOL) 500 MG tablet Take 500 mg by mouth every 6 (six) hours as needed for moderate pain or headache.  Marland Kitchen apixaban (ELIQUIS) 5 MG TABS tablet Take 2 tabs ('10mg'$ ) po BID for 7 days, then take 1 tab ('5mg'$ ) po BID  . ascorbic acid (VITAMIN C) 500 MG tablet Take 500 mg by mouth 2 (two) times daily.   . cholecalciferol (VITAMIN D) 1000 UNITS tablet Take 1,000 Units by mouth at bedtime.   Marland Kitchen desmopressin (DDAVP) 0.1 MG tablet Take 1 tablet (0.1 mg total) by mouth at bedtime.  Marland Kitchen dexamethasone (DECADRON) 4 MG tablet Take 1 tablet (4 mg total) by mouth 2 (two) times daily. (Patient taking differently: Take 2 mg by mouth 2 (two) times daily. )  . fish oil-omega-3 fatty acids 1000 MG capsule Take 1 g by mouth at bedtime.   . furosemide (LASIX) 20 MG tablet Take 1 tablet (20 mg total) by mouth daily. (Patient taking differently: Take 20 mg by mouth daily as needed for fluid. )  . levofloxacin (LEVAQUIN) 500 MG tablet Take 1 tablet (  500 mg total) by mouth daily.  Marland Kitchen LORazepam (ATIVAN) 0.5 MG tablet Take 1 tab PO Q 6 hours PRN nausea or anxiety (Patient taking differently: Take 0.5 mg by mouth every 6 (six) hours as  needed for anxiety (nausea). )  . methocarbamol (ROBAXIN) 500 MG tablet TAKE 1 TO 2 TABLETS BY MOUTH EVERY 6 HOURS AS NEEDED FOR MUSCLE SPASMS  . Multiple Vitamin (MULTIVITAMIN WITH MINERALS) TABS Take 1 tablet by mouth at bedtime.   . ondansetron (ZOFRAN) 8 MG tablet Take 1 tablet (8 mg total) by mouth every 8 (eight) hours as needed for nausea or vomiting.  Marland Kitchen oxyCODONE (ROXICODONE) 5 MG immediate release tablet Take 1 tablet (5 mg total) by mouth every 6 (six) hours as needed for severe pain.  . pantoprazole (PROTONIX) 40 MG tablet TAKE 1 TABLET BY MOUTH TWO  TIMES DAILY  . Probiotic Product (ALIGN) 4 MG CAPS Take 1 capsule by mouth daily. Reported on 09/10/2015  . prochlorperazine (COMPAZINE) 10 MG tablet Take 1 tablet (10 mg total) by mouth every 6 (six) hours as needed. (Patient taking differently: Take 10 mg by mouth every 6 (six) hours as needed for nausea or vomiting. )  . tamsulosin (FLOMAX) 0.4 MG CAPS capsule Take 2 capsules (0.8 mg total) by mouth daily after supper. (Patient taking differently: Take 0.8 mg by mouth 2 (two) times daily. )  . temazepam (RESTORIL) 15 MG capsule Take 1 capsule (15 mg total) by mouth at bedtime as needed. (Patient taking differently: Take 15 mg by mouth at bedtime as needed for sleep. )  . zolendronic acid (ZOMETA) 4 MG/5ML injection Inject 4 mg into the vein every 3 (three) months.   . polyethylene glycol (MIRALAX) packet Take 17 g by mouth daily as needed for mild constipation. (Patient not taking: Reported on 06/24/2016)    FAMILY HISTORY:  His indicated that his mother is deceased. He indicated that his father is deceased. He indicated that his sister is alive. He indicated that his maternal grandfather is deceased. He indicated that both of his daughters are alive. He indicated that the status of his neg hx is unknown.    SOCIAL HISTORY: He  reports that he quit smoking about 39 years ago. His smoking use included Cigarettes. He has a 30.00 pack-year  smoking history. He has never used smokeless tobacco. He reports that he drinks about 1.5 oz of alcohol per week . He reports that he does not use drugs.  REVIEW OF SYSTEMS:   Denies any cough, sputum production, dyspnea, fevers. Denies any chest pain, palpitation. Positive for nausea, vomiting. Denies any diarrhea, constipation. All other review of systems negative.  SUBJECTIVE:   VITAL SIGNS: BP (!) 89/43 (BP Location: Left Arm)   Pulse (!) 102   Temp 98 F (36.7 C)   Resp 20   Ht '5\' 10"'$  (1.778 m)   Wt 193 lb 12.6 oz (87.9 kg)   SpO2 96%   BMI 27.81 kg/m   HEMODYNAMICS:    VENTILATOR SETTINGS:    INTAKE / OUTPUT: I/O last 3 completed shifts: In: 3011 [P.O.:480; I.V.:2531] Out: 3130 [Urine:3126; Stool:4]  PHYSICAL EXAMINATION: General:  Awake, alert, oriented Neuro:  No focal deficits, twitching movements noted in upper and lower extremities HEENT:  Dry mucous membranes, no thyromegaly, JVD Cardiovascular:  Regular rate and rhythm, no MRG Lungs:  Clear, no wheeze, crackles Abdomen:  Distended, absent bowel sounds Musculoskeletal:  Normal tone and bulk  LABS:  BMET  Recent Labs Lab  06/27/16 0514 06/28/16 0535 06/29/16 0627  NA 151* 154* 150*  K 3.7 3.6 3.1*  CL 115* 116* 114*  CO2 '26 31 29  '$ BUN <5* 6 10  CREATININE 1.00 0.89 1.10  GLUCOSE 152* 134* 159*    Electrolytes  Recent Labs Lab 06/25/16 0357  06/27/16 0514 06/28/16 0535 06/29/16 0627  CALCIUM 8.8*  < > 8.8* 8.9 8.2*  MG 1.5*  --   --   --   --   < > = values in this interval not displayed.  CBC  Recent Labs Lab 06/27/16 0514 06/28/16 0535 06/29/16 0627  WBC 3.4* 4.1 7.2  HGB 8.7* 8.9* 9.0*  HCT 27.0* 27.4* 27.9*  PLT 88* 68* 58*    Coag's  Recent Labs Lab 06/24/16 2151  06/26/16 2315 06/27/16 0514 06/28/16 0535  APTT 26  < > 53* 53* 57*  INR 1.14  --   --   --   --   < > = values in this interval not displayed.  Sepsis Markers  Recent Labs Lab 06/24/16 2012   LATICACIDVEN 0.96    ABG No results for input(s): PHART, PCO2ART, PO2ART in the last 168 hours.  Liver Enzymes  Recent Labs Lab 06/24/16 1622  AST 18  ALT 24  ALKPHOS 70  BILITOT 0.6  ALBUMIN 2.5*    Cardiac Enzymes  Recent Labs Lab 06/24/16 1622  TROPONINI <0.03    Glucose  Recent Labs Lab 06/25/16 0750 06/26/16 0816 06/27/16 0804 06/28/16 0742 06/29/16 1138  GLUCAP 129* 117* 124* 116* 119*    Imaging No results found.  STUDIES:   CULTURES: BCX2 12/15 GNR>>>  ANTIBIOTICS: Merrem 12/20>>>  SIGNIFICANT EVENTS:   LINES/TUBES:   DISCUSSION:   ASSESSMENT / PLAN:  PULMONARY A: Cough  Possible Aspiration  ->CXR w/out clear infiltrate.  P:   NPO O2 as needed F/u CXR in am   CARDIOVASCULAR A:  Severe sepsis  P:  Lactic acid now Crystalloid challenge f/b repeat LA Will need pressors if no response to 30cc/kg bolus (2.5 lt) Hold any antihypertensives and diuretics  Tele  Agree w/ SDU transfer   RENAL A:   H/o BPH  Chronic prostatitis  Hypernatremia  Hyperchloremia Hypokalemia   P:   MAP goal >65 Change IVFs to D5LR and bolus w/ LR Strict I&O Renal dose meds  Recheck BMP and replete K > hypokalemia may be contributing to ileus.  GASTROINTESTINAL A:   SBO Projectile emesis  P:   Bowel rest CT abd/pelvis ordered by primary service  No NG tube at present. Will need replacement if CT is C/W SBO but pt is reluctant PPI  HEMATOLOGIC A:   NSCLC (80% PDL1+)-->treateing w/ [name] H/o PE and DVT on Eliquis Possible HIT H/o Multiple Myeloma w/ skull lesion s/p XRT 2016) P:  Cont eliquis  Trend CBC  INFECTIOUS A:   GNR bacteremia -->suspect transient bacterial translocation  Possible aspiration  P:   Merrem (PCN allergy).  PCT alogrithm  See CV section   ENDOCRINE A:   H/o DI P:   Cont DDAVP Stress dose steroids for hypotension.  NEUROLOGIC A:   No acute  P:   RASS goal: 0 PRN analgesia   FAMILY  -  Updates: Patient, wife, daughter updated at bedside. - Inter-disciplinary family meet or Palliative Care meeting due by:  07/06/16  Critical care time- 35 mins.  Marshell Garfinkel MD [facility] Pager [phone] If no answer or after 3pm  call: [phone] 06/29/2016, 4:28 PM

## 2016-06-30 ENCOUNTER — Inpatient Hospital Stay (HOSPITAL_COMMUNITY): Payer: Medicare Other

## 2016-06-30 ENCOUNTER — Ambulatory Visit
Admission: RE | Admit: 2016-06-30 | Discharge: 2016-06-30 | Disposition: A | Discharge status: Home / Self Care | Payer: Medicare Other | Source: Ambulatory Visit | Attending: Radiation Oncology | Admitting: Radiation Oncology

## 2016-06-30 ENCOUNTER — Other Ambulatory Visit (HOSPITAL_COMMUNITY): Payer: Self-pay

## 2016-06-30 ENCOUNTER — Encounter (HOSPITAL_COMMUNITY): Payer: Self-pay | Admitting: General Surgery

## 2016-06-30 DIAGNOSIS — R0600 Dyspnea, unspecified: Secondary | ICD-10-CM

## 2016-06-30 DIAGNOSIS — K922 Gastrointestinal hemorrhage, unspecified: Secondary | ICD-10-CM

## 2016-06-30 DIAGNOSIS — C3491 Malignant neoplasm of unspecified part of right bronchus or lung: Secondary | ICD-10-CM

## 2016-06-30 DIAGNOSIS — A419 Sepsis, unspecified organism: Secondary | ICD-10-CM

## 2016-06-30 HISTORY — PX: IR GENERIC HISTORICAL: IMG1180011

## 2016-06-30 LAB — CULTURE, BLOOD (SINGLE)

## 2016-06-30 LAB — BASIC METABOLIC PANEL
Anion gap: 5 (ref 5–15)
BUN: 15 mg/dL (ref 6–20)
CALCIUM: 7.7 mg/dL — AB (ref 8.9–10.3)
CO2: 30 mmol/L (ref 22–32)
CREATININE: 1.04 mg/dL (ref 0.61–1.24)
Chloride: 113 mmol/L — ABNORMAL HIGH (ref 101–111)
Glucose, Bld: 255 mg/dL — ABNORMAL HIGH (ref 65–99)
Potassium: 3.7 mmol/L (ref 3.5–5.1)
Sodium: 148 mmol/L — ABNORMAL HIGH (ref 135–145)

## 2016-06-30 LAB — PROCALCITONIN: PROCALCITONIN: 0.25 ng/mL

## 2016-06-30 LAB — GLUCOSE, CAPILLARY
GLUCOSE-CAPILLARY: 147 mg/dL — AB (ref 65–99)
GLUCOSE-CAPILLARY: 154 mg/dL — AB (ref 65–99)
Glucose-Capillary: 146 mg/dL — ABNORMAL HIGH (ref 65–99)
Glucose-Capillary: 154 mg/dL — ABNORMAL HIGH (ref 65–99)

## 2016-06-30 LAB — CBC
HCT: 23.7 % — ABNORMAL LOW (ref 39.0–52.0)
HCT: 22.5 % — ABNORMAL LOW (ref 39.0–52.0)
HEMOGLOBIN: 7.8 g/dL — AB (ref 13.0–17.0)
Hemoglobin: 7.5 g/dL — ABNORMAL LOW (ref 13.0–17.0)
MCH: 32.1 pg (ref 26.0–34.0)
MCH: 32.3 pg (ref 26.0–34.0)
MCHC: 32.9 g/dL (ref 30.0–36.0)
MCHC: 33.3 g/dL (ref 30.0–36.0)
MCV: 97.5 fL (ref 78.0–100.0)
MCV: 97 fL (ref 78.0–100.0)
PLATELETS: 54 10*3/uL — AB (ref 150–400)
Platelets: 50 10*3/uL — ABNORMAL LOW (ref 150–400)
RBC: 2.43 MIL/uL — ABNORMAL LOW (ref 4.22–5.81)
RBC: 2.32 MIL/uL — ABNORMAL LOW (ref 4.22–5.81)
RDW: 18.5 % — AB (ref 11.5–15.5)
RDW: 18.3 % — AB (ref 11.5–15.5)
WBC: 7.9 10*3/uL (ref 4.0–10.5)
WBC: 7.3 10*3/uL (ref 4.0–10.5)

## 2016-06-30 LAB — PROTIME-INR
INR: 1.61
PROTHROMBIN TIME: 19.3 s — AB (ref 11.4–15.2)

## 2016-06-30 LAB — LACTIC ACID, PLASMA: LACTIC ACID, VENOUS: 2.1 mmol/L — AB (ref 0.5–1.9)

## 2016-06-30 MED ORDER — MIDAZOLAM HCL 2 MG/2ML IJ SOLN
INTRAMUSCULAR | Status: AC
  Filled 2016-06-30: qty 4

## 2016-06-30 MED ORDER — FENTANYL CITRATE (PF) 100 MCG/2ML IJ SOLN
INTRAMUSCULAR | Status: AC
  Filled 2016-06-30: qty 2

## 2016-06-30 MED ORDER — PANTOPRAZOLE SODIUM 40 MG IV SOLR
40.0000 mg | Freq: Two times a day (BID) | INTRAVENOUS | Status: DC
  Administered 2016-06-30 (×2): 40 mg via INTRAVENOUS
  Filled 2016-06-30 (×2): qty 40

## 2016-06-30 MED ORDER — FENTANYL CITRATE (PF) 100 MCG/2ML IJ SOLN
INTRAMUSCULAR | Status: AC | PRN
  Administered 2016-06-30 (×2): 25 ug via INTRAVENOUS

## 2016-06-30 MED ORDER — MIDAZOLAM HCL 2 MG/2ML IJ SOLN
INTRAMUSCULAR | Status: AC | PRN
  Administered 2016-06-30 (×2): 1 mg via INTRAVENOUS
  Administered 2016-06-30: 0.5 mg via INTRAVENOUS

## 2016-06-30 MED ORDER — IOPAMIDOL (ISOVUE-300) INJECTION 61%
INTRAVENOUS | Status: AC
Start: 2016-06-30 — End: 2016-06-30
  Administered 2016-06-30: 55 mL via INTRAVENOUS
  Filled 2016-06-30: qty 50

## 2016-06-30 MED ORDER — METRONIDAZOLE IN NACL 5-0.79 MG/ML-% IV SOLN
500.0000 mg | Freq: Three times a day (TID) | INTRAVENOUS | Status: DC
  Administered 2016-06-30 – 2016-07-05 (×16): 500 mg via INTRAVENOUS
  Filled 2016-06-30 (×16): qty 100

## 2016-06-30 MED ORDER — LIDOCAINE HCL 1 % IJ SOLN
INTRAMUSCULAR | Status: AC
  Filled 2016-06-30: qty 20

## 2016-06-30 MED ORDER — LACTATED RINGERS IV SOLN
INTRAVENOUS | Status: DC
  Administered 2016-06-30 – 2016-07-03 (×4): via INTRAVENOUS

## 2016-06-30 MED ORDER — IOPAMIDOL (ISOVUE-300) INJECTION 61%
50.0000 mL | Freq: Once | INTRAVENOUS | Status: AC | PRN
  Administered 2016-06-30: 55 mL via INTRAVENOUS

## 2016-06-30 MED ORDER — LIDOCAINE HCL 1 % IJ SOLN
INTRAMUSCULAR | Status: AC | PRN
  Administered 2016-06-30: 10 mL

## 2016-06-30 MED ORDER — INSULIN ASPART 100 UNIT/ML ~~LOC~~ SOLN
0.0000 [IU] | SUBCUTANEOUS | Status: DC
  Administered 2016-06-30: 3 [IU] via SUBCUTANEOUS
  Administered 2016-06-30: 2 [IU] via SUBCUTANEOUS
  Administered 2016-06-30: 3 [IU] via SUBCUTANEOUS
  Administered 2016-06-30 – 2016-07-01 (×2): 2 [IU] via SUBCUTANEOUS

## 2016-06-30 MED ORDER — DEXTROSE 5 % IV SOLN
INTRAVENOUS | Status: DC
  Administered 2016-06-30 – 2016-07-01 (×3): via INTRAVENOUS

## 2016-06-30 NOTE — Progress Notes (Signed)
Rehab admissions - I met with patient this am.  PT/OT recommending inpatient rehab admission.  Noted patient on pressers and currently NPO.  Patient has [place] and I am not sure that insurance carrier would approve an acute inpatient rehab stay.  Not yet medically ready to consider rehab admit.  Will follow progress for now.  Call me for questions.  #[phone]

## 2016-06-30 NOTE — Consult Note (Signed)
Chief Complaint: acute bilateral LE DVTs  Referring Physician:Dr. Tera Partridge  Supervising Physician: Jacqulynn Cadet  Patient Status: [facility] - In-pt  HPI: [name] is an 75 y.o. male with a history of stage IV non-small cell lung cancer with metastatic disease in his brain for which he has undergone 5 rounds of radiation.  He was admitted about a month ago with a PE.  He was placed on Eliquis.  He was admitted on Friday secondary to a bowel obstruction.  He started have BRBPR yesterday with CT scan findings concerning for colitis.  His Eliquis has since been held.  He had dopplers today which revealed acute bilateral LE DVTs.  A request has been made for an IVC filter placement as he may not be on anticoagulation at this time.  Past Medical History:  Past Medical History:  Diagnosis Date  . Adenocarcinoma of right lung, stage 4 ([place]) 05/27/2016  . Adenomatous polyp of colon 1996  . Arthritis    Osteoarthritis right knee, spine, wrist  . Arthritis of hand, right    Right thumb (Dr. Daylene Katayama)  . Back ache    r/o Multiple Myeloma  . Benign prostatic hypertrophy    (Dr. Karsten Ro in past)  . Degenerative disc disease   . Diverticulosis   . DVT (deep venous thrombosis) ([place]) 01/2014   R calf  . Encounter for antineoplastic chemotherapy 10/20/2015  . Erosive esophagitis   . GERD (gastroesophageal reflux disease)   . Hemorrhoids   . Hiatal hernia   . History of chronic prostatitis   . History of melanoma    Back (Dr. Wilhemina Bonito); early melanoma R arm 03/2011  . Multiple myeloma ([place]) 2013   skull lytic lesions--treated with radiation 07/2014  . Pulmonary embolism ([place]) 01/2014  . Radiation 07/21/14-08/05/14   left occipital parietal skull 25 gray  . SCC (squamous cell carcinoma) 08/04/15   right ear;dysplastic nevus with severe atypia-chest    Past Surgical History:  Past Surgical History:  Procedure Laterality Date  . APPENDECTOMY    . BONE MARROW TRANSPLANT  03/2013   Stem Cell  . COLONOSCOPY  2009   adenomatous polyp  . ESOPHAGOGASTRODUODENOSCOPY  2009   mild inflammation at esophagogastric junction  . excision of melanoma     back ( x3)  . Great toe surgery     bilateral  . INGUINAL HERNIA REPAIR     left, x 2  . port placemet  10/2012  . SCALENE NODE BIOPSY Right 05/11/2016   Procedure: BIOPSY RIGHT SCALENE NODE;  Surgeon: Grace Isaac, MD;  Location: [place];  Service: Thoracic;  Laterality: Right;  . TOTAL KNEE ARTHROPLASTY  06/08/2011   Procedure: TOTAL KNEE ARTHROPLASTY;  Surgeon: Ninetta Lights, MD;  Location: [place];  Service: Orthopedics;  Laterality: Right;  osteonics  . total knee replaced  2000   Left knee    Family History:  Family History  Problem Relation Age of Onset  . Dementia Mother   . Stroke Father     brainstem stroke  . Hypertension Father   . Diabetes Father   . Prostate cancer Maternal Grandfather   . Depression Daughter   . Anesthesia problems Neg Hx     Social History:  reports that he quit smoking about 39 years ago. His smoking use included Cigarettes. He has a 30.00 pack-year smoking history. He has never used smokeless tobacco. He reports that he drinks about 1.5 oz of alcohol per week .  He reports that he does not use drugs.  Allergies:  Allergies  Allergen Reactions  . Tramadol Palpitations    "Made my chest feel tight"  . Amoxicillin     REACTION: rash  . Penicillins Rash    Has patient had a PCN reaction causing immediate rash, facial/tongue/throat swelling, SOB or lightheadedness with hypotension: unknown Has patient had a PCN reaction causing severe rash involving mucus membranes or skin necrosis: unknown Has patient had a PCN reaction that required hospitalization: no  Has patient had a PCN reaction occurring within the last 10 years: no  If all of the above answers are "NO", then may proceed with Cephalosporin use.     Medications: Medications reviewed in Epic  Please HPI for pertinent  positives, otherwise complete 10 system ROS negative.  Mallampati Score: MD Evaluation Airway: WNL Heart: WNL Abdomen: WNL Chest/ Lungs: WNL ASA  Classification: 3 Mallampati/Airway Score: Two  Physical Exam: BP (!) 83/55   Pulse 70   Temp 97.6 F (36.4 C) (Axillary)   Resp 20   Ht '5\' 10"'$  (1.778 m)   Wt 200 lb 2.8 oz (90.8 kg)   SpO2 (!) 88%   BMI 28.72 kg/m  Body mass index is 28.72 kg/m. General: pleasant, WD, WN white male who is laying in bed in NAD HEENT: head is normocephalic, atraumatic.  Sclera are noninjected.  PERRL.  Ears and nose without any masses or lesions.  Mouth is pink and moist Heart: regular, rate, and rhythm.  Normal s1,s2. No obvious murmurs, gallops, or rubs noted.  Palpable radial and pedal pulses bilaterally Lungs: CTAB, no wheezes, rhonchi, or rales noted.  Respiratory effort nonlabored Abd: soft, mildly tender in RLQ, ND, +BS, no masses, hernias, or organomegaly MS: all 4 extremities are symmetrical with no cyanosis, clubbing, or edema.  No erythema or pain in both legs. Psych: A&Ox3 with an appropriate affect.   Labs: Results for orders placed or performed during the hospital encounter of 06/24/16 (from the past 48 hour(s))  CBC     Status: Abnormal   Collection Time: 06/29/16  6:27 AM  Result Value Ref Range   WBC 7.2 4.0 - 10.5 K/uL   RBC 2.84 (L) 4.22 - 5.81 MIL/uL   Hemoglobin 9.0 (L) 13.0 - 17.0 g/dL   HCT 27.9 (L) 39.0 - 52.0 %   MCV 98.2 78.0 - 100.0 fL   MCH 31.7 26.0 - 34.0 pg   MCHC 32.3 30.0 - 36.0 g/dL   RDW 18.6 (H) 11.5 - 15.5 %   Platelets 58 (L) 150 - 400 K/uL    Comment: CONSISTENT WITH PREVIOUS RESULT  Basic metabolic panel     Status: Abnormal   Collection Time: 06/29/16  6:27 AM  Result Value Ref Range   Sodium 150 (H) 135 - 145 mmol/L   Potassium 3.1 (L) 3.5 - 5.1 mmol/L   Chloride 114 (H) 101 - 111 mmol/L   CO2 29 22 - 32 mmol/L   Glucose, Bld 159 (H) 65 - 99 mg/dL   BUN 10 6 - 20 mg/dL   Creatinine, Ser 1.10  0.61 - 1.24 mg/dL   Calcium 8.2 (L) 8.9 - 10.3 mg/dL   GFR calc non Af Amer >60 >60 mL/min   GFR calc Af Amer >60 >60 mL/min    Comment: (NOTE) The eGFR has been calculated using the CKD EPI equation. This calculation has not been validated in all clinical situations. eGFR's persistently <60 mL/min signify possible Chronic Kidney Disease.  Anion gap 7 5 - 15  Glucose, capillary     Status: Abnormal   Collection Time: 06/29/16 11:38 AM  Result Value Ref Range   Glucose-Capillary 119 (H) 65 - 99 mg/dL  Lactic acid, plasma     Status: Abnormal   Collection Time: 06/29/16  3:07 PM  Result Value Ref Range   Lactic Acid, Venous 2.5 (HH) 0.5 - 1.9 mmol/L    Comment: CRITICAL RESULT CALLED TO, READ BACK BY AND VERIFIED WITH: ALDRIDGE,D. AT 1546 06/29/16 BY THOMPSON,N.   Urinalysis, Routine w reflex microscopic     Status: None   Collection Time: 06/29/16  9:21 PM  Result Value Ref Range   Color, Urine YELLOW YELLOW   APPearance CLEAR CLEAR   Specific Gravity, Urine 1.014 1.005 - 1.030   pH 7.0 5.0 - 8.0   Glucose, UA NEGATIVE NEGATIVE mg/dL   Hgb urine dipstick NEGATIVE NEGATIVE   Bilirubin Urine NEGATIVE NEGATIVE   Ketones, ur NEGATIVE NEGATIVE mg/dL   Protein, ur NEGATIVE NEGATIVE mg/dL   Nitrite NEGATIVE NEGATIVE   Leukocytes, UA NEGATIVE NEGATIVE  Basic metabolic panel     Status: Abnormal   Collection Time: 06/29/16  9:21 PM  Result Value Ref Range   Sodium 147 (H) 135 - 145 mmol/L   Potassium 3.5 3.5 - 5.1 mmol/L   Chloride 112 (H) 101 - 111 mmol/L   CO2 26 22 - 32 mmol/L   Glucose, Bld 155 (H) 65 - 99 mg/dL   BUN 17 6 - 20 mg/dL   Creatinine, Ser 1.12 0.61 - 1.24 mg/dL   Calcium 7.9 (L) 8.9 - 10.3 mg/dL   GFR calc non Af Amer >60 >60 mL/min   GFR calc Af Amer >60 >60 mL/min    Comment: (NOTE) The eGFR has been calculated using the CKD EPI equation. This calculation has not been validated in all clinical situations. eGFR's persistently <60 mL/min signify possible  Chronic Kidney Disease.    Anion gap 9 5 - 15  CBC     Status: Abnormal   Collection Time: 06/29/16  9:21 PM  Result Value Ref Range   WBC 8.1 4.0 - 10.5 K/uL   RBC 2.65 (L) 4.22 - 5.81 MIL/uL   Hemoglobin 8.7 (L) 13.0 - 17.0 g/dL   HCT 26.5 (L) 39.0 - 52.0 %   MCV 100.0 78.0 - 100.0 fL   MCH 32.8 26.0 - 34.0 pg   MCHC 32.8 30.0 - 36.0 g/dL   RDW 18.9 (H) 11.5 - 15.5 %   Platelets 56 (L) 150 - 400 K/uL    Comment: CONSISTENT WITH PREVIOUS RESULT  Lactic acid, plasma     Status: Abnormal   Collection Time: 06/29/16  9:25 PM  Result Value Ref Range   Lactic Acid, Venous 2.3 (HH) 0.5 - 1.9 mmol/L    Comment: CRITICAL RESULT CALLED TO, READ BACK BY AND VERIFIED WITH: KROLICZAK,A. AT 6378 58/85/02 BY THOMPSON,N.   CBC     Status: Abnormal   Collection Time: 06/30/16  4:54 AM  Result Value Ref Range   WBC 7.9 4.0 - 10.5 K/uL   RBC 2.43 (L) 4.22 - 5.81 MIL/uL   Hemoglobin 7.8 (L) 13.0 - 17.0 g/dL   HCT 23.7 (L) 39.0 - 52.0 %   MCV 97.5 78.0 - 100.0 fL   MCH 32.1 26.0 - 34.0 pg   MCHC 32.9 30.0 - 36.0 g/dL   RDW 18.5 (H) 11.5 - 15.5 %   Platelets 54 (L) 150 -  400 K/uL    Comment: CONSISTENT WITH PREVIOUS RESULT  Basic metabolic panel     Status: Abnormal   Collection Time: 06/30/16  4:54 AM  Result Value Ref Range   Sodium 148 (H) 135 - 145 mmol/L   Potassium 3.7 3.5 - 5.1 mmol/L   Chloride 113 (H) 101 - 111 mmol/L   CO2 30 22 - 32 mmol/L   Glucose, Bld 255 (H) 65 - 99 mg/dL   BUN 15 6 - 20 mg/dL   Creatinine, Ser 1.04 0.61 - 1.24 mg/dL   Calcium 7.7 (L) 8.9 - 10.3 mg/dL   GFR calc non Af Amer >60 >60 mL/min   GFR calc Af Amer >60 >60 mL/min    Comment: (NOTE) The eGFR has been calculated using the CKD EPI equation. This calculation has not been validated in all clinical situations. eGFR's persistently <60 mL/min signify possible Chronic Kidney Disease.    Anion gap 5 5 - 15  Procalcitonin - Baseline     Status: None   Collection Time: 06/30/16 10:33 AM  Result  Value Ref Range   Procalcitonin 0.25 ng/mL    Comment:        Interpretation: PCT (Procalcitonin) <= 0.5 ng/mL: Systemic infection (sepsis) is not likely. Local bacterial infection is possible. (NOTE)         ICU PCT Algorithm               Non ICU PCT Algorithm    ----------------------------     ------------------------------         PCT < 0.25 ng/mL                 PCT < 0.1 ng/mL     Stopping of antibiotics            Stopping of antibiotics       strongly encouraged.               strongly encouraged.    ----------------------------     ------------------------------       PCT level decrease by               PCT < 0.25 ng/mL       >= 80% from peak PCT       OR PCT 0.25 - 0.5 ng/mL          Stopping of antibiotics                                             encouraged.     Stopping of antibiotics           encouraged.    ----------------------------     ------------------------------       PCT level decrease by              PCT >= 0.25 ng/mL       < 80% from peak PCT        AND PCT >= 0.5 ng/mL            Continuin g antibiotics                                              encouraged.       Continuing antibiotics  encouraged.    ----------------------------     ------------------------------     PCT level increase compared          PCT > 0.5 ng/mL         with peak PCT AND          PCT >= 0.5 ng/mL             Escalation of antibiotics                                          strongly encouraged.      Escalation of antibiotics        strongly encouraged.   Lactic acid, plasma     Status: Abnormal   Collection Time: 06/30/16 11:32 AM  Result Value Ref Range   Lactic Acid, Venous 2.1 (HH) 0.5 - 1.9 mmol/L    Comment: CRITICAL RESULT CALLED TO, READ BACK BY AND VERIFIED WITH: SUGGS,Z. RN AT 1220 06/30/16 MULLINS,T   Protime-INR     Status: Abnormal   Collection Time: 06/30/16 11:32 AM  Result Value Ref Range   Prothrombin Time 19.3 (H) 11.4 - 15.2 seconds    INR 1.61   Glucose, capillary     Status: Abnormal   Collection Time: 06/30/16 11:46 AM  Result Value Ref Range   Glucose-Capillary 146 (H) 65 - 99 mg/dL   *Note: Due to a large number of results and/or encounters for the requested time period, some results have not been displayed. A complete set of results can be found in Results Review.    Imaging: Ct Abdomen Pelvis Wo Contrast  Result Date: 06/29/2016 CLINICAL DATA:  75 year old male with stage IV adenocarcinoma of the lung with intracranial metastatic disease October 2017 undergoing chemotherapy. Recent diagnosis of recurrent small bowel obstruction. Pulmonary embolus on blood thinner. Subsequent encounter. EXAM: CT ABDOMEN AND PELVIS WITHOUT CONTRAST TECHNIQUE: Multidetector CT imaging of the abdomen and pelvis was performed following the standard protocol without IV contrast. COMPARISON:  06/27/2016 plain film exam. 06/24/2016 CT scan of the chest, abdomen and pelvis. FINDINGS: Lower chest: Basilar atelectatic changes. Coronary artery calcifications. Hepatobiliary: Taking into account limitation by non contrast imaging, no worrisome mass. No calcified gallstones. Nodular area of enhancement gallbladder fundus noted on prior exam not appreciated on present exam. Pancreas: Taking into account limitation by non contrast imaging, no inflammation or mass. Spleen: Taking into account limitation by non contrast imaging, no enlargement or mass. Adrenals/Urinary Tract: Taking into account limitation by non contrast imaging, no adrenal or renal mass or evidence hydronephrosis. Scarring left kidney. Stomach/Bowel: Persistently dilated fluid and gas filled small bowel loops to level of the right lower quadrant/ distal ilium with abrupt transition without mass identified and therefore may be related to stricture/adhesion. Interval development of abnormal thickening of the ascending colon and proximal descending colon with surrounding inflammation of fat planes  suggesting colitis whether inflammatory infectious or ischemic. Vascular/Lymphatic: Atherosclerotic changes aorta with mild ectasia. Aneurysmal dilation common iliac arteries measuring 2.5 cm on the right and 2.3 cm on the left. No adenopathy. Reproductive: Slightly lobulated prostate gland with impression upon the bladder base. Decompressed urinary bladder with circumferential thickening anterior superior wall. This did not appear thickened on recent CT urinary bladder was distended. Other: Small amount of free fluid in the pelvis. Mild third spacing of fluid. No free intraperitoneal air. Musculoskeletal: Scoliosis and degenerative changes with  Schmorl's node deformities without new compression fracture or osseous destructive lesion. IMPRESSION: Persistently dilated fluid and gas filled small bowel loops to level of the right lower quadrant/ distal ilium with abrupt transition without mass identified and therefore may be related to stricture/adhesion. Interval development of abnormal thickening of the ascending colon and proximal descending colon with surrounding inflammation of fat planes suggesting colitis whether inflammatory infectious or ischemic. Small amount of free fluid in pelvis.  No free intraperitoneal air. Aneurysmal dilation common iliac arteries measuring 2.5 cm on the right and 2.3 cm on the left. Electronically Signed   By: Genia Del M.D.   On: 06/29/2016 17:03   Dg Chest 1 View  Result Date: 06/29/2016 CLINICAL DATA:  History of lung cancer, hypotension EXAM: CHEST 1 VIEW COMPARISON:  06/24/2016 FINDINGS: Cardiomediastinal silhouette is stable. Right IJ Port-A-Cath is unchanged in position. There is streaky mild interstitial prominence bilateral perihilar with slight improvement from prior exam. No segmental infiltrate. No convincing pulmonary edema. IMPRESSION: There is streaky mild interstitial prominence bilateral perihilar with slight improvement from prior exam. No segmental  infiltrate. No convincing pulmonary edema. Stable right Port-A-Cath position. Electronically Signed   By: Lahoma Crocker M.D.   On: 06/29/2016 15:48    Assessment/Plan 1. Bilateral acute lower extremity DVTs -patient is admitted currently with an SBO; however, he is now having BRBPR.  He was on eliquis for a recent PE.  This has been held.  He had doppler scans today which revealed acute bilateral lower extremity DVTs.  A request has been made for an IVC filter to be placed since he may not continue right now on his anticoagulation. We will plan to proceed with this today.  If we are unable, then we will do this tomorrow.  Continue NPO for now for procedure. -Risks and Benefits discussed with the patient including, but not limited to bleeding, infection, contrast induced renal failure, filter fracture or migration which can lead to emergency surgery or even death, strut penetration with damage or irritation to adjacent structures and caval thrombosis. All of the patient's questions were answered, patient is agreeable to proceed. Consent signed and in chart.   Thank you for this interesting consult.  I greatly enjoyed meeting JAEL KOSTICK and look forward to participating in their care.  A copy of this report was sent to the requesting provider on this date.  Electronically Signed: Henreitta Cea 06/30/2016, 2:40 PM   I spent a total of 40 Minutes    in face to face in clinical consultation, greater than 50% of which was counseling/coordinating care for acute BLE DVTs, request for IVC filter

## 2016-06-30 NOTE — Progress Notes (Signed)
PT Cancellation Note  Patient Details Name: [name] MRN: [id number] DOB: May 27, 1941   Cancelled Treatment:    Reason Eval/Treat Not Completed: Medical issues which prohibited therapy  Pt with shock and sepsis, attempting to wean pressors.  Also found to have bil DVTs.  Per previous NP note: "considering IVC filter if clot is present"  Will await improvement in medical status and further plan of care for DVTs.  [name],KATHrine E 06/30/2016, 1:21 PM Carmelia Bake, PT, DPT 06/30/2016 Pager: [phone]

## 2016-06-30 NOTE — Progress Notes (Signed)
VASCULAR LAB PRELIMINARY  PRELIMINARY  PRELIMINARY  PRELIMINARY  Bilateral lower extremity venous duplex completed.    Preliminary report:  Right - Positive for an acute DVT noted in the peroneal, popliteal, and distal femoral veins. Chronic superficial thrombus noted in on branch of the posterior tibial vein. No obvious evidence of superficial thrombosis  Left - Positive for and acute DVT note in the popliteal and gastrocnemius veins. No evidence of a superficial thrombosis.  Bilateral - No evidence of a Baker's cyst  Mcguire Gasparyan, RVS 06/30/2016, 12:47 PM

## 2016-06-30 NOTE — Progress Notes (Signed)
OT Cancellation Note  Patient Details Name: [name] MRN: [id number] DOB: [birth year]   Cancelled Treatment:    Reason Eval/Treat Not Completed: Medical issues which prohibited therapy   Medical issues which prohibited therapy  Pt with shock and sepsis, attempting to wean pressors.  Also found to have bil DVTs.  Per previous NP note: "considering IVC filter if clot is present"  Will await improvement in medical status and further plan of care for DVTs.  Betsy Pries 06/30/2016, 1:28 PM

## 2016-06-30 NOTE — Progress Notes (Signed)
Patient was seen and examined at bedside.  Has septic shock on pressors GI bleed, stopping eliquis -on antibiotics (cefepime, flagyl) for colitis, gram negative bacteremia.  I discussed with Critical care team. The CCM team will assume patient's care from now on.   Discussed with Salvadore Dom, NP from CCM.

## 2016-06-30 NOTE — Progress Notes (Signed)
CRITICAL VALUE ALERT  Critical value received: lactic acid 2.3  Date of notification:  06/29/16  Time of notification:  2125  Critical value read back:Yes.    Nurse who received alert:  Reche Dixon  MD notified (1st page):  [place]  Time of first page:  2135  MD notified (2nd page):  Time of second page:  Responding MD:  Lamonte Sakai  Time MD responded:  2135

## 2016-06-30 NOTE — Procedures (Signed)
Interventional Radiology Procedure Note  Procedure: Placement of a right IJ approach retrievable IVC fliter  Complications: None  Estimated Blood Loss: 0  Recommendations:  - Bedrest 2 hrs with HOB elevated - F/U in IR clinic to eval for retrieval  Signed,  Criselda Peaches, MD

## 2016-06-30 NOTE — Progress Notes (Signed)
Patient ID: [name], male   DOB: 11-Jan-1941, 75 y.o.   MRN: [id number]  [facility] Surgery Progress Note     Subjective: Feels much better today. Denies abdominal pain or bloating, nausea, or vomiting. Passing small amount of flatus.  Unfortunately diagnosed with bilateral acute lower extremity DVTs and will undergo IVF filter placement soon.  Objective: Vital signs in last 24 hours: Temp:  [97.5 F (36.4 C)-101.7 F (38.7 C)] 97.6 F (36.4 C) (12/21 0800) Pulse Rate:  [70-118] 70 (12/21 0935) Resp:  [6-25] 20 (12/21 0935) BP: (59-135)/(26-86) 83/55 (12/21 0935) SpO2:  [82 %-100 %] 88 % (12/21 0935) Weight:  [200 lb 2.8 oz (90.8 kg)] 200 lb 2.8 oz (90.8 kg) (12/21 0456) Last BM Date: 06/29/16  Intake/Output from previous day: 12/20 0701 - 12/21 0700 In: 3051.4 [I.V.:2551.4; IV Piggyback:500] Out: 1500 [Urine:1200; Emesis/NG output:300] Intake/Output this shift: Total I/O In: 526.1 [I.V.:326.1; IV Piggyback:200] Out: -   PE: Gen:  Alert, NAD, pleasant Pulm:  On O2, effort normal Abd: Soft, NT/ND, +BS, no HSM Ext:  No erythema, edema, or tenderness   Lab Results:   Recent Labs  06/29/16 2121 06/30/16 0454  WBC 8.1 7.9  HGB 8.7* 7.8*  HCT 26.5* 23.7*  PLT 56* 54*   BMET  Recent Labs  06/29/16 2121 06/30/16 0454  NA 147* 148*  K 3.5 3.7  CL 112* 113*  CO2 26 30  GLUCOSE 155* 255*  BUN 17 15  CREATININE 1.12 1.04  CALCIUM 7.9* 7.7*   PT/INR  Recent Labs  06/30/16 1132  LABPROT 19.3*  INR 1.61   CMP     Component Value Date/Time   NA 148 (H) 06/30/2016 0454   NA 132 (L) 06/15/2016 0852   K 3.7 06/30/2016 0454   K 4.1 06/15/2016 0852   CL 113 (H) 06/30/2016 0454   CL 109 (H) 12/31/2012 0759   CO2 30 06/30/2016 0454   CO2 23 06/15/2016 0852   GLUCOSE 255 (H) 06/30/2016 0454   GLUCOSE 101 06/15/2016 0852   GLUCOSE 116 (H) 12/31/2012 0759   BUN 15 06/30/2016 0454   BUN 19.0 06/15/2016 0852   CREATININE 1.04 06/30/2016 0454   CREATININE 0.8 06/15/2016 0852   CALCIUM 7.7 (L) 06/30/2016 0454   CALCIUM 8.5 06/15/2016 0852   PROT 5.2 (L) 06/24/2016 1622   PROT 5.6 (L) 06/15/2016 0852   ALBUMIN 2.5 (L) 06/24/2016 1622   ALBUMIN 2.8 (L) 06/15/2016 0852   AST 18 06/24/2016 1622   AST 10 06/15/2016 0852   ALT 24 06/24/2016 1622   ALT 27 06/15/2016 0852   ALKPHOS 70 06/24/2016 1622   ALKPHOS 77 06/15/2016 0852   BILITOT 0.6 06/24/2016 1622   BILITOT 0.99 06/15/2016 0852   GFRNONAA >60 06/30/2016 0454   GFRAA >60 06/30/2016 0454   Lipase     Component Value Date/Time   LIPASE 21 06/24/2016 1622       Studies/Results: Ct Abdomen Pelvis Wo Contrast  Result Date: 06/29/2016 CLINICAL DATA:  75 year old male with stage IV adenocarcinoma of the lung with intracranial metastatic disease October 2017 undergoing chemotherapy. Recent diagnosis of recurrent small bowel obstruction. Pulmonary embolus on blood thinner. Subsequent encounter. EXAM: CT ABDOMEN AND PELVIS WITHOUT CONTRAST TECHNIQUE: Multidetector CT imaging of the abdomen and pelvis was performed following the standard protocol without IV contrast. COMPARISON:  06/27/2016 plain film exam. 06/24/2016 CT scan of the chest, abdomen and pelvis. FINDINGS: Lower chest: Basilar atelectatic changes. Coronary artery calcifications. Hepatobiliary: Taking  into account limitation by non contrast imaging, no worrisome mass. No calcified gallstones. Nodular area of enhancement gallbladder fundus noted on prior exam not appreciated on present exam. Pancreas: Taking into account limitation by non contrast imaging, no inflammation or mass. Spleen: Taking into account limitation by non contrast imaging, no enlargement or mass. Adrenals/Urinary Tract: Taking into account limitation by non contrast imaging, no adrenal or renal mass or evidence hydronephrosis. Scarring left kidney. Stomach/Bowel: Persistently dilated fluid and gas filled small bowel loops to level of the right lower  quadrant/ distal ilium with abrupt transition without mass identified and therefore may be related to stricture/adhesion. Interval development of abnormal thickening of the ascending colon and proximal descending colon with surrounding inflammation of fat planes suggesting colitis whether inflammatory infectious or ischemic. Vascular/Lymphatic: Atherosclerotic changes aorta with mild ectasia. Aneurysmal dilation common iliac arteries measuring 2.5 cm on the right and 2.3 cm on the left. No adenopathy. Reproductive: Slightly lobulated prostate gland with impression upon the bladder base. Decompressed urinary bladder with circumferential thickening anterior superior wall. This did not appear thickened on recent CT urinary bladder was distended. Other: Small amount of free fluid in the pelvis. Mild third spacing of fluid. No free intraperitoneal air. Musculoskeletal: Scoliosis and degenerative changes with Schmorl's node deformities without new compression fracture or osseous destructive lesion. IMPRESSION: Persistently dilated fluid and gas filled small bowel loops to level of the right lower quadrant/ distal ilium with abrupt transition without mass identified and therefore may be related to stricture/adhesion. Interval development of abnormal thickening of the ascending colon and proximal descending colon with surrounding inflammation of fat planes suggesting colitis whether inflammatory infectious or ischemic. Small amount of free fluid in pelvis.  No free intraperitoneal air. Aneurysmal dilation common iliac arteries measuring 2.5 cm on the right and 2.3 cm on the left. Electronically Signed   By: Genia Del M.D.   On: 06/29/2016 17:03   Dg Chest 1 View  Result Date: 06/29/2016 CLINICAL DATA:  History of lung cancer, hypotension EXAM: CHEST 1 VIEW COMPARISON:  06/24/2016 FINDINGS: Cardiomediastinal silhouette is stable. Right IJ Port-A-Cath is unchanged in position. There is streaky mild interstitial  prominence bilateral perihilar with slight improvement from prior exam. No segmental infiltrate. No convincing pulmonary edema. IMPRESSION: There is streaky mild interstitial prominence bilateral perihilar with slight improvement from prior exam. No segmental infiltrate. No convincing pulmonary edema. Stable right Port-A-Cath position. Electronically Signed   By: Lahoma Crocker M.D.   On: 06/29/2016 15:48    Anti-infectives: Anti-infectives    Start     Dose/Rate Route Frequency Ordered Stop   06/30/16 0830  metroNIDAZOLE (FLAGYL) IVPB 500 mg     500 mg 100 mL/hr over 60 Minutes Intravenous Every 8 hours 06/30/16 0735     06/29/16 1800  meropenem (MERREM) 1 g in sodium chloride 0.9 % 100 mL IVPB     1 g 200 mL/hr over 30 Minutes Intravenous Every 8 hours 06/29/16 1552     06/29/16 1430  levofloxacin (LEVAQUIN) IVPB 750 mg  Status:  Discontinued     750 mg 100 mL/hr over 90 Minutes Intravenous Every 24 hours 06/29/16 1355 06/29/16 1543   06/25/16 1000  levofloxacin (LEVAQUIN) tablet 500 mg  Status:  Discontinued     500 mg Oral Daily 06/24/16 2026 06/25/16 0941       Assessment/Plan H/o partial SBO - currently passing flatus, abdominal distension resolved, and has no n/v Colitis of the ascending and descending colon - Ischemic vs  infectious - CT 12/20 shows colitis of the ascending colon and proximal descending colon  - C diff pending - on empiric Merrem and Flagyl  Septic shock - on pressors GNR bacteremia   Stage IV lung cancer - Previously on Keytruda Diabetes insipidus  Hypernatremia Pulmonary embolus - on eliquis at home Bilateral acute lower extremity DVT's - planning IVF filter placement soon Hx of chronic dCHF Malnutrition GERD  FEN: IV fluids, clear diet after procedure ID: Merrem 12/20>>, Flagyl 12/21>>  Plan: I saw and evaluated this patient with Dr. Marlou Starks. He is currently passing flatus, abdominal distension resolved, and he has no n/v. Would not recommend  surgery for partial SBO at this time. Continue nonoperative treatment for colitis with empiric antibiotics; if this does not resolve he would require resection of a large portion of his colon. Ok for clears from our standpoint.    LOS: 6 days    [name] , [facility] Surgery 06/30/2016, 4:02 PM Pager: [phone] Consults: [phone] Mon-Fri 7:00 am-4:30 pm Sat-Sun 7:00 am-11:30 am

## 2016-06-30 NOTE — Progress Notes (Signed)
PULMONARY / CRITICAL CARE MEDICINE   Name: [name] MRN: [id number] DOB: May 18, 1941    ADMISSION DATE:  06/24/2016 CONSULTATION DATE:  06/29/16  REFERRING MD:  Katheran James, MD  CHIEF COMPLAINT:  Sepsis, gram-negative rod bacteremia  HISTORY OF PRESENT ILLNESS:   75 year old with stage IV adenocarcinoma, arthritis, BPH, DVT. He has diagnosis of stage IVB (T1b, N2, M1c) non-small cell lung cancer, adenocarcinoma with metastases to the brain diagnosed in October 2017. He was undergoing chemotherapy with Prembrolizomab and recent dx of recurrent SBO, PE on eliquis who was admitted on 12/15 with severe abdominal pain. He was assessed by surgery who recommended conservative management with IVF and NG decompression.  On 06/29/16 he developed nausea and projectile vomiting, decreased blood pressure. Noted to have gram-negative rod bacteremia. He was transferred to stepdown for further management and PCCM consulted.   REVIEW OF SYSTEMS:   Denies any cough, sputum production, dyspnea, fevers. Denies any chest pain, palpitation. Positive for nausea, vomiting. Denies any diarrhea, constipation. All other review of systems negative.  SUBJECTIVE:   VITAL SIGNS: BP 97/73 (BP Location: Right Arm)   Pulse 75   Temp 97.6 F (36.4 C) (Axillary)   Resp (!) 22   Ht _0  (1.778 m)   Wt 200 lb 2.8 oz (90.8 kg)   SpO2 95%   BMI 28.72 kg/m   HEMODYNAMICS:    VENTILATOR SETTINGS:    INTAKE / OUTPUT: I/O last 3 completed shifts: In: 3726.4 [I.V.:3226.4; IV Piggyback:500] Out: 1500 [Urine:1200; Emesis/NG output:300]  PHYSICAL EXAMINATION: General:  Awake, alert, oriented Neuro:  No focal deficits, twitching movements noted in upper and lower extremities, this seems to have improved some HEENT:  Dry mucous membranes, no thyromegaly, JVD Cardiovascular:  Regular rate and rhythm, no MRG Lungs:  Clear, no wheeze, crackles, no accessory use  Abdomen:  Distended, absent bowel  sounds Musculoskeletal:  Normal tone and bulk  LABS:  BMET  Recent Labs Lab 06/29/16 0627 06/29/16 2121 06/30/16 0454  NA 150* 147* 148*  K 3.1* 3.5 3.7  CL 114* 112* 113*  CO2 _1 BUN _2 CREATININE 1.10 1.12 1.04  GLUCOSE 159* 155* 255*    Electrolytes  Recent Labs Lab 06/25/16 0357  06/29/16 0627 06/29/16 2121 06/30/16 0454  CALCIUM 8.8*  < > 8.2* 7.9* 7.7*  MG 1.5*  --   --   --   --   < > = values in this interval not displayed.  CBC  Recent Labs Lab 06/29/16 0627 06/29/16 2121 06/30/16 0454  WBC 7.2 8.1 7.9  HGB 9.0* 8.7* 7.8*  HCT 27.9* 26.5* 23.7*  PLT 58* 56* 54*    Coag's  Recent Labs Lab 06/24/16 2151  06/26/16 2315 06/27/16 0514 06/28/16 0535  APTT 26  < > 53* 53* 57*  INR 1.14  --   --   --   --   < > = values in this interval not displayed.  Sepsis Markers  Recent Labs Lab 06/24/16 2012 06/29/16 1507 06/29/16 2125  LATICACIDVEN 0.96 2.5* 2.3*    ABG No results for input(s): PHART, PCO2ART, PO2ART in the last 168 hours.  Liver Enzymes  Recent Labs Lab 06/24/16 1622  AST 18  ALT 24  ALKPHOS 70  BILITOT 0.6  ALBUMIN 2.5*    Cardiac Enzymes  Recent Labs Lab 06/24/16 1622  TROPONINI <0.03    Glucose  Recent Labs Lab 06/25/16 0750 06/26/16 0816 06/27/16 0804 06/28/16 0742 06/29/16  1138  GLUCAP 129* 117* 124* 116* 119*    Imaging Ct Abdomen Pelvis Wo Contrast  Result Date: 06/29/2016 CLINICAL DATA:  75 year old male with stage IV adenocarcinoma of the lung with intracranial metastatic disease October 2017 undergoing chemotherapy. Recent diagnosis of recurrent small bowel obstruction. Pulmonary embolus on blood thinner. Subsequent encounter. EXAM: CT ABDOMEN AND PELVIS WITHOUT CONTRAST TECHNIQUE: Multidetector CT imaging of the abdomen and pelvis was performed following the standard protocol without IV contrast. COMPARISON:  06/27/2016 plain film exam. 06/24/2016 CT scan of the chest, abdomen  and pelvis. FINDINGS: Lower chest: Basilar atelectatic changes. Coronary artery calcifications. Hepatobiliary: Taking into account limitation by non contrast imaging, no worrisome mass. No calcified gallstones. Nodular area of enhancement gallbladder fundus noted on prior exam not appreciated on present exam. Pancreas: Taking into account limitation by non contrast imaging, no inflammation or mass. Spleen: Taking into account limitation by non contrast imaging, no enlargement or mass. Adrenals/Urinary Tract: Taking into account limitation by non contrast imaging, no adrenal or renal mass or evidence hydronephrosis. Scarring left kidney. Stomach/Bowel: Persistently dilated fluid and gas filled small bowel loops to level of the right lower quadrant/ distal ilium with abrupt transition without mass identified and therefore may be related to stricture/adhesion. Interval development of abnormal thickening of the ascending colon and proximal descending colon with surrounding inflammation of fat planes suggesting colitis whether inflammatory infectious or ischemic. Vascular/Lymphatic: Atherosclerotic changes aorta with mild ectasia. Aneurysmal dilation common iliac arteries measuring 2.5 cm on the right and 2.3 cm on the left. No adenopathy. Reproductive: Slightly lobulated prostate gland with impression upon the bladder base. Decompressed urinary bladder with circumferential thickening anterior superior wall. This did not appear thickened on recent CT urinary bladder was distended. Other: Small amount of free fluid in the pelvis. Mild third spacing of fluid. No free intraperitoneal air. Musculoskeletal: Scoliosis and degenerative changes with Schmorl's node deformities without new compression fracture or osseous destructive lesion. IMPRESSION: Persistently dilated fluid and gas filled small bowel loops to level of the right lower quadrant/ distal ilium with abrupt transition without mass identified and therefore may be  related to stricture/adhesion. Interval development of abnormal thickening of the ascending colon and proximal descending colon with surrounding inflammation of fat planes suggesting colitis whether inflammatory infectious or ischemic. Small amount of free fluid in pelvis.  No free intraperitoneal air. Aneurysmal dilation common iliac arteries measuring 2.5 cm on the right and 2.3 cm on the left. Electronically Signed   By: Genia Del M.D.   On: 06/29/2016 17:03   Dg Chest 1 View  Result Date: 06/29/2016 CLINICAL DATA:  History of lung cancer, hypotension EXAM: CHEST 1 VIEW COMPARISON:  06/24/2016 FINDINGS: Cardiomediastinal silhouette is stable. Right IJ Port-A-Cath is unchanged in position. There is streaky mild interstitial prominence bilateral perihilar with slight improvement from prior exam. No segmental infiltrate. No convincing pulmonary edema. IMPRESSION: There is streaky mild interstitial prominence bilateral perihilar with slight improvement from prior exam. No segmental infiltrate. No convincing pulmonary edema. Stable right Port-A-Cath position. Electronically Signed   By: Lahoma Crocker M.D.   On: 06/29/2016 15:48    STUDIES:  Venous US 12/21>>> CT abd/pelvis 12/20: Persistently dilated fluid and gas filled small bowel loops to level of the right lower quadrant/ distal ilium with abrupt transition without mass identified and therefore may be related to stricture/adhesion.Interval development of abnormal thickening of the ascending colon and proximal descending colon with surrounding inflammation of fat planes suggesting colitis whether inflammatory infectious or ischemic.  CULTURES: BCX2 12/15 GNR>>>  ANTIBIOTICS: Merrem 12/20>>> Flagyl 12/21>>> SIGNIFICANT EVENTS:   LINES/TUBES:   DISCUSSION: 75 yo w/ NSCLC, recurrent bowel obstruction, PE on NOAC. Admitted w/ recurrent SBO & GNR bacteremia on 12/15. PCCM asked to see on 12/20 when he became progressively hypotensive. Remains  in septic shock but appears we are meeting his end-organ perfusion requirements (renal fxn stable). CT imaging of abd raising concern for ischemia vs infectious colitis. Hgb has dropped almost 2 gms in setting of new colitis. For today: -Stat LE [place] r/o clot if still present will need IVC filter.  -Cont abx; if C diff neg then we can stop flagyl as carbapenem has excellent anaerobic coverage   -adding free water replacement to correct water loss w/ underlying DI -awaiting surgical services to weigh in. Could this area of stricture be contributing to the colitis? Does this represent ischemia and if so should he be explored? -need to address nutrition.    ASSESSMENT / PLAN:  PULMONARY A: Cough  Possible Aspiration  ->CXR w/out clear infiltrate.  P:   NPO O2 as needed F/u CXR in am   CARDIOVASCULAR A:  Severe sepsis/septic shock. LA stable.  End-organ fxn remains intact Still pressor dep  P:  Change to ICU status Cont IVFs  Cont stress dose steroids  Hold any antihypertensives and diuretics  Tele   RENAL A:   H/o BPH  Chronic prostatitis  Hypernatremia  Hyperchloremia H/o DI  Hypokalemia  -->resolved P:   MAP goal >65 Cont D5 LR Add free water to match free water goals Strict I&O Renal dose meds . Cont DDAVP  GASTROINTESTINAL A:   SBO New colitis ? Ischemic vs infectious-->favor ischemia in setting of SBO Projectile emesis  P:   Bowel rest PPI See ID section   HEMATOLOGIC A:   NSCLC (80% PDL1+)-->treateing w/ [name] H/o PE and DVT on Eliquis Possible HIT H/o Multiple Myeloma w/ skull lesion s/p XRT 2016) Blood loss anemia  P:  Repeat CBC q 8 Stop eliquis  Will need IVC filter   INFECTIOUS A:   GNR bacteremia -->suspect transient bacterial translocation  Possible aspiration  Colitis ischemic vs infectious -->favor ischemia  P:   Merrem (PCN allergy).  IM started flagyl. Will ck Cdiff; if negative stop as merrem has excellent anaerobic  coverage PCT alogrithm  See CV section   ENDOCRINE A:   H/o DI P:   Cont DDAVP Stress dose steroids for hypotension.  NEUROLOGIC A:   No acute  P:   RASS goal: 0 PRN analgesia   FAMILY  - Updates: Patient, wife, daughter updated at bedside. - Inter-disciplinary family meet or Palliative Care meeting due by:  07/06/16   My critical care time  45 minutes Erick Colace ACNP-BC [place] Pager # [phone] OR # [phone] if no answer  06/30/2016, 9:42 AM

## 2016-06-30 NOTE — Progress Notes (Signed)
Inpatient Diabetes Program Recommendations  AACE/ADA: New Consensus Statement on Inpatient Glycemic Control (2015)  Target Ranges:  Prepandial:   less than 140 mg/dL      Peak postprandial:   less than 180 mg/dL (1-2 hours)      Critically ill patients:  140 - 180 mg/dL   Lab Results  Component Value Date   GLUCAP 146 (H) 06/30/2016   HGBA1C 5.2 04/25/2016    Review of Glycemic Control  Needs correction insulin.  Inpatient Diabetes Program Recommendations:    Add Novolog sensitive Q4H while NPO.  Will follow. Thank you. Lorenda Peck, RD, LDN, CDE Inpatient Diabetes Coordinator [phone]

## 2016-06-30 NOTE — Progress Notes (Signed)
MEDICATION-RELATED CONSULT NOTE   IR Procedure Consult - Anticoagulant/Antiplatelet PTA/Inpatient Med List Review by Pharmacist   Assessment: Patient underwent IVC placement as Eliquis has been held d/t bleeding  Plan:  No anticoagulation is to be restarted post procedurally  Reuel Boom, PharmD, BCPS Pager: [phone] 06/30/2016, 8:45 PM

## 2016-07-01 ENCOUNTER — Inpatient Hospital Stay (HOSPITAL_COMMUNITY): Payer: Medicare Other

## 2016-07-01 ENCOUNTER — Other Ambulatory Visit (HOSPITAL_COMMUNITY): Payer: Self-pay

## 2016-07-01 DIAGNOSIS — R7881 Bacteremia: Secondary | ICD-10-CM

## 2016-07-01 DIAGNOSIS — R6521 Severe sepsis with septic shock: Secondary | ICD-10-CM

## 2016-07-01 DIAGNOSIS — I82441 Acute embolism and thrombosis of right tibial vein: Secondary | ICD-10-CM

## 2016-07-01 LAB — BASIC METABOLIC PANEL
ANION GAP: 4 — AB (ref 5–15)
Anion gap: 10 (ref 5–15)
BUN: 11 mg/dL (ref 6–20)
BUN: 11 mg/dL (ref 6–20)
CHLORIDE: 113 mmol/L — AB (ref 101–111)
CO2: 27 mmol/L (ref 22–32)
CO2: 28 mmol/L (ref 22–32)
CREATININE: 0.89 mg/dL (ref 0.61–1.24)
Calcium: 7.6 mg/dL — ABNORMAL LOW (ref 8.9–10.3)
Calcium: 7.3 mg/dL — ABNORMAL LOW (ref 8.9–10.3)
Chloride: 111 mmol/L (ref 101–111)
Creatinine, Ser: 0.8 mg/dL (ref 0.61–1.24)
GFR calc Af Amer: >60 mL/min (ref >60)
GFR calc Af Amer: >60 mL/min (ref >60)
GFR calc non Af Amer: >60 mL/min (ref >60)
Glucose, Bld: 171 mg/dL — ABNORMAL HIGH (ref 65–99)
Glucose, Bld: 147 mg/dL — ABNORMAL HIGH (ref 65–99)
POTASSIUM: 3 mmol/L — AB (ref 3.5–5.1)
POTASSIUM: 3.2 mmol/L — AB (ref 3.5–5.1)
SODIUM: 143 mmol/L (ref 135–145)
Sodium: 150 mmol/L — ABNORMAL HIGH (ref 135–145)

## 2016-07-01 LAB — GLUCOSE, CAPILLARY
GLUCOSE-CAPILLARY: 116 mg/dL — AB (ref 65–99)
GLUCOSE-CAPILLARY: 124 mg/dL — AB (ref 65–99)
Glucose-Capillary: 146 mg/dL — ABNORMAL HIGH (ref 65–99)
Glucose-Capillary: 118 mg/dL — ABNORMAL HIGH (ref 65–99)
Glucose-Capillary: 168 mg/dL — ABNORMAL HIGH (ref 65–99)
Glucose-Capillary: 167 mg/dL — ABNORMAL HIGH (ref 65–99)

## 2016-07-01 LAB — ECHOCARDIOGRAM LIMITED
HEIGHTINCHES: 70 in
Weight: 3252.23 oz

## 2016-07-01 LAB — CBC
HCT: 22.3 % — ABNORMAL LOW (ref 39.0–52.0)
HEMOGLOBIN: 7.4 g/dL — AB (ref 13.0–17.0)
MCH: 33 pg (ref 26.0–34.0)
MCHC: 33.2 g/dL (ref 30.0–36.0)
MCV: 99.6 fL (ref 78.0–100.0)
Platelets: 45 10*3/uL — ABNORMAL LOW (ref 150–400)
RBC: 2.24 MIL/uL — AB (ref 4.22–5.81)
RDW: 18.4 % — ABNORMAL HIGH (ref 11.5–15.5)
WBC: 7.5 10*3/uL (ref 4.0–10.5)

## 2016-07-01 LAB — PROCALCITONIN: PROCALCITONIN: 0.14 ng/mL

## 2016-07-01 MED ORDER — POTASSIUM CHLORIDE CRYS ER 20 MEQ PO TBCR
40.0000 meq | EXTENDED_RELEASE_TABLET | ORAL | Status: AC
  Administered 2016-07-01 (×2): 40 meq via ORAL
  Filled 2016-07-01 (×2): qty 2

## 2016-07-01 MED ORDER — PANTOPRAZOLE SODIUM 40 MG PO TBEC
40.0000 mg | DELAYED_RELEASE_TABLET | Freq: Every day | ORAL | Status: DC
  Administered 2016-07-01 – 2016-07-13 (×13): 40 mg via ORAL
  Filled 2016-07-01 (×16): qty 1

## 2016-07-01 MED ORDER — INSULIN ASPART 100 UNIT/ML ~~LOC~~ SOLN
0.0000 [IU] | Freq: Three times a day (TID) | SUBCUTANEOUS | Status: DC
  Administered 2016-07-01: 1 [IU] via SUBCUTANEOUS
  Administered 2016-07-02 – 2016-07-04 (×5): 3 [IU] via SUBCUTANEOUS
  Administered 2016-07-04: 2 [IU] via SUBCUTANEOUS
  Administered 2016-07-05: 5 [IU] via SUBCUTANEOUS
  Administered 2016-07-05 – 2016-07-08 (×3): 2 [IU] via SUBCUTANEOUS

## 2016-07-01 NOTE — Progress Notes (Signed)
PULMONARY / CRITICAL CARE MEDICINE   Name: [name] MRN: [id number] DOB: March 04, 1941    ADMISSION DATE:  06/24/2016 CONSULTATION DATE:  06/29/16  REFERRING MD:  Katheran James, MD  CHIEF COMPLAINT:  Sepsis, gram-negative rod bacteremia  HISTORY OF PRESENT ILLNESS:   75 year old with stage IV adenocarcinoma, arthritis, BPH, DVT. He has diagnosis of stage IVB (T1b, N2, M1c) non-small cell lung cancer, adenocarcinoma with metastases to the brain diagnosed in October 2017. He was undergoing chemotherapy with Prembrolizomab and recent dx of recurrent SBO, PE on eliquis who was admitted on 12/15 with severe abdominal pain. He was assessed by surgery who recommended conservative management with IVF and NG decompression.  On 06/29/16 he developed nausea and projectile vomiting, decreased blood pressure. Noted to have gram-negative rod bacteremia. He was transferred to stepdown for further management and PCCM consulted.   REVIEW OF SYSTEMS:   No distress. Feels better   SUBJECTIVE:  Feeling stronger  VITAL SIGNS: BP 102/80   Pulse 65   Temp 97.9 F (36.6 C) (Oral)   Resp 14   Ht '5\' 10"'$  (1.778 m)   Wt 203 lb 4.2 oz (92.2 kg)   SpO2 93%   BMI 29.17 kg/m   HEMODYNAMICS:    VENTILATOR SETTINGS:    INTAKE / OUTPUT: I/O last 3 completed shifts: In: 6723 [P.O.:720; I.V.:5203; IV Piggyback:800] Out: 6384 [Urine:3075]  PHYSICAL EXAMINATION: General:  Awake, alert, oriented, no pain  Neuro:  No focal deficits. Oriented X 4 HEENT:  Dry mucous membranes, no thyromegaly, JVD Cardiovascular:  Regular rate and rhythm, no MRG; occ PACs Lungs:  Clear, no wheeze, crackles, no accessory use  Abdomen:  Distended, absent bowel sounds Musculoskeletal:  Normal tone and bulk  LABS:  BMET  Recent Labs Lab 06/30/16 0454 06/30/16 2247 07/01/16 0317  NA 148* 150* 143  K 3.7 3.0* 3.2*  CL 113* 113* 111  CO2 '30 27 28  '$ BUN '15 11 11  '$ CREATININE 1.04 0.89 0.80  GLUCOSE 255* 171* 147*     Electrolytes  Recent Labs Lab 06/25/16 0357  06/30/16 0454 06/30/16 2247 07/01/16 0317  CALCIUM 8.8*  < > 7.7* 7.6* 7.3*  MG 1.5*  --   --   --   --   < > = values in this interval not displayed.  CBC  Recent Labs Lab 06/30/16 0454 06/30/16 2247 07/01/16 0317  WBC 7.9 7.3 7.5  HGB 7.8* 7.5* 7.4*  HCT 23.7* 22.5* 22.3*  PLT 54* 50* 45*    Coag's  Recent Labs Lab 06/24/16 2151  06/26/16 2315 06/27/16 0514 06/28/16 0535 06/30/16 1132  APTT 26  < > 53* 53* 57*  --   INR 1.14  --   --   --   --  1.61  < > = values in this interval not displayed.  Sepsis Markers  Recent Labs Lab 06/29/16 1507 06/29/16 2125 06/30/16 1033 06/30/16 1132 07/01/16 0317  LATICACIDVEN 2.5* 2.3*  --  2.1*  --   PROCALCITON  --   --  0.25  --  0.14    ABG No results for input(s): PHART, PCO2ART, PO2ART in the last 168 hours.  Liver Enzymes  Recent Labs Lab 06/24/16 1622  AST 18  ALT 24  ALKPHOS 70  BILITOT 0.6  ALBUMIN 2.5*    Cardiac Enzymes  Recent Labs Lab 06/24/16 1622  TROPONINI <0.03    Glucose  Recent Labs Lab 06/30/16 1146 06/30/16 1711 06/30/16 1912 06/30/16 2303 07/01/16 0308 07/01/16  0722  GLUCAP 146* 154* 147* 154* 146* 116*    Imaging Ir Ivc Filter Plmt / S&i /img Guid/mod Sed  Result Date: 06/30/2016 INDICATION: 75 year old male with a history of prior DVT and pulmonary embolism currently on anticoagulation via Eliquis. Unfortunately, he developed bright red blood per rectum recently and CT imaging suggests colitis. Therefore, his anticoagulation has been stopped. Repeat bilateral lower extremity ultrasound demonstrates bilateral acute DVT. Therefore, he requires caval interruption for PE prophylaxis. Of note, he has a history of multiple myeloma end stage IV lung adenocarcinoma. EXAM: ULTRASOUND GUIDANCE FOR VASCULARACCESS IVC CATHETERIZATION AND VENOGRAM IVC FILTER INSERTION Interventional Radiologist:  Criselda Peaches, MD  MEDICATIONS: None. ANESTHESIA/SEDATION: Fentanyl 50 mcg IV; Versed 2.5 mg IV Moderate Sedation Time:  11 minutes The patient was continuously monitored during the procedure by the interventional radiology nurse under my direct supervision. FLUOROSCOPY TIME:  Fluoroscopy Time: 1 minutes 48 seconds (182 mGy). COMPLICATIONS: None immediate. PROCEDURE: Informed written consent was obtained from the patient after a thorough discussion of the procedural risks, benefits and alternatives. All questions were addressed. Maximal Sterile Barrier Technique was utilized including caps, mask, sterile gowns, sterile gloves, sterile drape, hand hygiene and skin antiseptic. A timeout was performed prior to the initiation of the procedure. Maximal barrier sterile technique utilized including caps, mask, sterile gowns, sterile gloves, large sterile drape, hand hygiene, and Betadine prep. Under sterile condition and local anesthesia, right internal jugular venous access was performed with ultrasound. An ultrasound image was saved and sent to PACS. Over a guidewire, the IVC filter delivery sheath and inner dilator were advanced into the IVC just above the IVC bifurcation. Contrast injection was performed for an IVC venogram. Through the delivery sheath, a retrievable Denali IVC filter was deployed below the level of the renal veins and above the IVC bifurcation. Limited post deployment venacavagram was performed. The delivery sheath was removed and hemostasis was obtained with manual compression. A dressing was placed. The patient tolerated the procedure well without immediate post procedural complication. FINDINGS: The IVC is patent. No evidence of thrombus, stenosis, or occlusion. No variant venous anatomy. Successful placement of the IVC filter below the level of the renal veins. IMPRESSION: Successful ultrasound and fluoroscopically guided placement of an infrarenal retrievable IVC filter via right jugular approach. PLAN: This IVC  filter is potentially retrievable. The patient will be assessed for filter retrieval by Interventional Radiology in approximately 8-12 weeks. Further recommendations regarding filter retrieval, continued surveillance or declaration of device permanence, will be made at that time. Signed, Criselda Peaches, MD [facility] Radiology Electronically Signed   By: Jacqulynn Cadet M.D.   On: 06/30/2016 16:59   Dg Chest Port 1 View  Result Date: 07/01/2016 CLINICAL DATA:  Pneumonia. EXAM: PORTABLE CHEST 1 VIEW COMPARISON:  06/29/2016. FINDINGS: PowerPort catheter noted with lead tip projected over the right atrium. Heart size stable. Bilateral upper lobe and right base infiltrates again noted. Slight progression from prior exam. No prominent pleural effusion or pneumothorax. IMPRESSION: 1. PowerPort catheter in stable position . 2. Bilateral upper lobe and right base infiltrates. Slight progression from prior exam Electronically Signed   By: Marcello Moores  Register   On: 07/01/2016 06:51    STUDIES:  Venous US 12/21>>>Findings consistent with acute deep vein thrombosis involving peroneal, popliteal, and distal femoral arteryof the right lower extremity. - Findings consistent with acute deep vein thrombosis involving the   popliteal and gastrocnemius veins of the left lower extremity. - Findings consistent  with chronic deep vein thrombosis involving   them proximal posterior tibial vein of the right lower extremity. - No evidence of a superficial thrombosis of the right or left   lower extremity. CT abd/pelvis 12/20: Persistently dilated fluid and gas filled small bowel loops to level of the right lower quadrant/ distal ilium with abrupt transition without mass identified and therefore may be related to stricture/adhesion.Interval development of abnormal thickening of the ascending colon and proximal descending colon with surrounding inflammation of fat planes  suggesting colitis whether inflammatory infectious or ischemic.  CULTURES: BCX2 12/15: bacteroides vulgatus BCX2 12/20>>>  ANTIBIOTICS: Merrem 12/20>>> Flagyl 12/21>>>  SIGNIFICANT EVENTS:   LINES/TUBES:   DISCUSSION: 75 yo w/ NSCLC, recurrent bowel obstruction, PE on NOAC. Admitted w/ recurrent SBO & GNR bacteremia on 12/15. PCCM asked to see on 12/20 when he became progressively hypotensive. CT imaging of abd raising concern for ischemia vs infectious colitis. New acute LE DVT in-spite of NOAC, which we had to stop 12/21 d/t colitis related GIB and hgb drop of almost 2 gms. Now s/p IVC filter. Surgery following and recommends against surgery.  For today: -Cont abx; if C diff neg then we can stop flagyl as carbapenem has excellent anaerobic coverage   -adv diet -get OOB -replace K -cont ICU monitoring    ASSESSMENT / PLAN:  PULMONARY A: Cough  Possible Aspiration  ->CXR w/ some progression of infiltrates suspect he has aspiration event when vomiting. Clinically had cough following the event  P:   O2 as needed  CARDIOVASCULAR A:  Severe sepsis/septic shock. LA stable.  End-organ fxn remains intact Still pressor dep but weaning  P:  Cont IVFs  Cont stress dose steroids start taper 12/23 Hold any antihypertensives and diuretics  Tele   RENAL A:   H/o BPH  Chronic prostatitis  Hypernatremia -->improved w/ free water replacement  Hyperchloremia H/o DI  Hypokalemia   P:   MAP goal >65 Replace K Cont LR (decrease rate to 50 ml/hr) Added free water 12/21, decrease to 50 ml/hr Strict I&O Renal dose meds . Cont DDAVP F/u chem in am   GASTROINTESTINAL A:   SBO New colitis ? Ischemic vs infectious-->favor ischemia in setting of SBO Projectile emesis (resolved) -->surgical services has seen and also reviewed CT imaging. They recommend against surgery  P:   Diet per surgical services   PPI See ID section   HEMATOLOGIC A:   NSCLC (80%  PDL1+)-->treateing w/ [name] H/o PE and DVT on Eliquis-->new DVTs on NOAC 12/21 Possible HIT H/o Multiple Myeloma w/ skull lesion s/p XRT 2016) Blood loss anemia  P:  Trend CBC Stopped NOAC for now... Temp Filter placed 12/21. When he gets to a point where anticoagulation can be resumed he will need heme to weigh in; ideally would be better off on LMWH. There was concern about HIT in past but was a clinical concern not objectively studied.   INFECTIOUS A:   Bacteroides Vulgatus  bacteremia (beta lactamase +)-->suspect transient bacterial translocation  Possible aspiration  Colitis ischemic vs infectious -->favor ischemia  P:   Merrem (PCN allergy).  IM started flagyl. Cdiff pending. if negative stop as merrem has excellent anaerobic coverage PCT alogrithm  See CV section   ENDOCRINE A:   H/o DI P:   Cont DDAVP Stress dose steroids for hypotension.  NEUROLOGIC A:   No acute  P:   RASS goal: 0 PRN analgesia   FAMILY  - Updates: Patient, wife, daughter updated at bedside. -  Inter-disciplinary family meet or Palliative Care meeting due by:  07/06/16   My critical care time  45 minutes Erick Colace ACNP-BC [place] Pager # [phone] OR # [phone] if no answer  07/01/2016, 7:49 AM

## 2016-07-01 NOTE — Progress Notes (Signed)
Pharmacy Antibiotic Note  [name] is a 75 y.o. male admitted on 06/24/2016 with PE, recent admission for SBO also  hx of metastatic lung cancer.   Patient's currently on meropenem for colitis, suspected sepsis/bacteremia/aspiration.  Today, 07/01/2016: - day #3 abx - afeb, wbc wnl - scr ok (crcl~91) -  PCT down - all cultures have been negative thus far   Plan: - continue Meropenem 1gm IV q8h - continue  flagyl 500 mg IV q8h for r/o cdiff - Follow renal function  _________________________________  Height: '5\' 10"'$  (177.8 cm) Weight: 203 lb 4.2 oz (92.2 kg) IBW/kg (Calculated) : 73  Temp (24hrs), Avg:97.6 F (36.4 C), Min:97.4 F (36.3 C), Max:97.9 F (36.6 C)   Recent Labs Lab 06/24/16 2012  06/29/16 0627 06/29/16 1507 06/29/16 2121 06/29/16 2125 06/30/16 0454 06/30/16 1132 06/30/16 2247 07/01/16 0317  WBC  --   < > 7.2  --  8.1  --  7.9  --  7.3 7.5  CREATININE  --   < > 1.10  --  1.12  --  1.04  --  0.89 0.80  LATICACIDVEN 0.96  --   --  2.5*  --  2.3*  --  2.1*  --   --   < > = values in this interval not displayed.  Estimated Creatinine Clearance: 91.1 mL/min (by C-G formula based on SCr of 0.8 mg/dL).    Allergies  Allergen Reactions  . Tramadol Palpitations    "Made my chest feel tight"  . Amoxicillin     REACTION: rash  . Penicillins Rash    Has patient had a PCN reaction causing immediate rash, facial/tongue/throat swelling, SOB or lightheadedness with hypotension: unknown Has patient had a PCN reaction causing severe rash involving mucus membranes or skin necrosis: unknown Has patient had a PCN reaction that required hospitalization: no  Has patient had a PCN reaction occurring within the last 10 years: no  If all of the above answers are "NO", then may proceed with Cephalosporin use.    Antibiotics this Admission: 12/16 >> Levaquin x1, 12/20 x1 12/20 >>Meropenem >> 12/21 >> Flagyl >>    Microbiology results: 12/15 BCx x1: anaerobic  GNR (no organisms detected on BCID) 12/16 MRSA PCR: neg 12/20 BCx x2: 12/20 CDiff PCR: needs collection  Thank you for allowing pharmacy to be a part of this patient's care.  Dia Sitter, PharmD, BCPS 07/01/2016 9:42 AM

## 2016-07-01 NOTE — Progress Notes (Signed)
[facility] Progress Note Patient Name: [name] DOB: [birth year] MRN: [id number]   Date of Service  07/01/2016  HPI/Events of Note  Hypokalemia  eICU Interventions  Potassium replaced     Intervention Category Intermediate Interventions: Electrolyte abnormality - evaluation and management  DETERDING,ELIZABETH 07/01/2016, 1:48 AM

## 2016-07-01 NOTE — Progress Notes (Signed)
  Echocardiogram 2D Echocardiogram has been performed.  Donata Clay 07/01/2016, 2:47 PM

## 2016-07-01 NOTE — Progress Notes (Signed)
Subjective: Awake and alert. No complaints. No nausea or vomiting  Objective: Vital signs in last 24 hours: Temp:  [97.4 F (36.3 C)-97.9 F (36.6 C)] 97.9 F (36.6 C) (12/22 0748) Pulse Rate:  [45-117] 68 (12/22 0700) Resp:  [12-26] 17 (12/22 0700) BP: (53-157)/(28-88) 90/60 (12/22 0700) SpO2:  [82 %-98 %] 92 % (12/22 0700) Weight:  [92.2 kg (203 lb 4.2 oz)] 92.2 kg (203 lb 4.2 oz) (12/22 0324) Last BM Date: 06/29/16  Intake/Output from previous day: 12/21 0701 - 12/22 0700 In: 4713.3 [P.O.:720; I.V.:3293.3; IV Piggyback:700] Out: 1875 [Urine:1875] Intake/Output this shift: Total I/O In: 250 [I.V.:250] Out: 275 [Urine:275]  Resp: clear to auscultation bilaterally Cardio: regular rate and rhythm GI: soft, nontender. not distended  Lab Results:   Recent Labs  06/30/16 2247 07/01/16 0317  WBC 7.3 7.5  HGB 7.5* 7.4*  HCT 22.5* 22.3*  PLT 50* 45*   BMET  Recent Labs  06/30/16 2247 07/01/16 0317  NA 150* 143  K 3.0* 3.2*  CL 113* 111  CO2 27 28  GLUCOSE 171* 147*  BUN 11 11  CREATININE 0.89 0.80  CALCIUM 7.6* 7.3*   PT/INR  Recent Labs  06/30/16 1132  LABPROT 19.3*  INR 1.61   ABG No results for input(s): PHART, HCO3 in the last 72 hours.  Invalid input(s): PCO2, PO2  Studies/Results: Ct Abdomen Pelvis Wo Contrast  Result Date: 06/29/2016 CLINICAL DATA:  75 year old male with stage IV adenocarcinoma of the lung with intracranial metastatic disease October 2017 undergoing chemotherapy. Recent diagnosis of recurrent small bowel obstruction. Pulmonary embolus on blood thinner. Subsequent encounter. EXAM: CT ABDOMEN AND PELVIS WITHOUT CONTRAST TECHNIQUE: Multidetector CT imaging of the abdomen and pelvis was performed following the standard protocol without IV contrast. COMPARISON:  06/27/2016 plain film exam. 06/24/2016 CT scan of the chest, abdomen and pelvis. FINDINGS: Lower chest: Basilar atelectatic changes. Coronary artery calcifications.  Hepatobiliary: Taking into account limitation by non contrast imaging, no worrisome mass. No calcified gallstones. Nodular area of enhancement gallbladder fundus noted on prior exam not appreciated on present exam. Pancreas: Taking into account limitation by non contrast imaging, no inflammation or mass. Spleen: Taking into account limitation by non contrast imaging, no enlargement or mass. Adrenals/Urinary Tract: Taking into account limitation by non contrast imaging, no adrenal or renal mass or evidence hydronephrosis. Scarring left kidney. Stomach/Bowel: Persistently dilated fluid and gas filled small bowel loops to level of the right lower quadrant/ distal ilium with abrupt transition without mass identified and therefore may be related to stricture/adhesion. Interval development of abnormal thickening of the ascending colon and proximal descending colon with surrounding inflammation of fat planes suggesting colitis whether inflammatory infectious or ischemic. Vascular/Lymphatic: Atherosclerotic changes aorta with mild ectasia. Aneurysmal dilation common iliac arteries measuring 2.5 cm on the right and 2.3 cm on the left. No adenopathy. Reproductive: Slightly lobulated prostate gland with impression upon the bladder base. Decompressed urinary bladder with circumferential thickening anterior superior wall. This did not appear thickened on recent CT urinary bladder was distended. Other: Small amount of free fluid in the pelvis. Mild third spacing of fluid. No free intraperitoneal air. Musculoskeletal: Scoliosis and degenerative changes with Schmorl's node deformities without new compression fracture or osseous destructive lesion. IMPRESSION: Persistently dilated fluid and gas filled small bowel loops to level of the right lower quadrant/ distal ilium with abrupt transition without mass identified and therefore may be related to stricture/adhesion. Interval development of abnormal thickening of the ascending colon  and proximal descending colon  with surrounding inflammation of fat planes suggesting colitis whether inflammatory infectious or ischemic. Small amount of free fluid in pelvis.  No free intraperitoneal air. Aneurysmal dilation common iliac arteries measuring 2.5 cm on the right and 2.3 cm on the left. Electronically Signed   By: Genia Del M.D.   On: 06/29/2016 17:03   Dg Chest 1 View  Result Date: 06/29/2016 CLINICAL DATA:  History of lung cancer, hypotension EXAM: CHEST 1 VIEW COMPARISON:  06/24/2016 FINDINGS: Cardiomediastinal silhouette is stable. Right IJ Port-A-Cath is unchanged in position. There is streaky mild interstitial prominence bilateral perihilar with slight improvement from prior exam. No segmental infiltrate. No convincing pulmonary edema. IMPRESSION: There is streaky mild interstitial prominence bilateral perihilar with slight improvement from prior exam. No segmental infiltrate. No convincing pulmonary edema. Stable right Port-A-Cath position. Electronically Signed   By: Lahoma Crocker M.D.   On: 06/29/2016 15:48   Ir Ivc Filter Plmt / S&i /img Guid/mod Sed  Result Date: 06/30/2016 INDICATION: 75 year old male with a history of prior DVT and pulmonary embolism currently on anticoagulation via Eliquis. Unfortunately, he developed bright red blood per rectum recently and CT imaging suggests colitis. Therefore, his anticoagulation has been stopped. Repeat bilateral lower extremity ultrasound demonstrates bilateral acute DVT. Therefore, he requires caval interruption for PE prophylaxis. Of note, he has a history of multiple myeloma end stage IV lung adenocarcinoma. EXAM: ULTRASOUND GUIDANCE FOR VASCULARACCESS IVC CATHETERIZATION AND VENOGRAM IVC FILTER INSERTION Interventional Radiologist:  Criselda Peaches, MD MEDICATIONS: None. ANESTHESIA/SEDATION: Fentanyl 50 mcg IV; Versed 2.5 mg IV Moderate Sedation Time:  11 minutes The patient was continuously monitored during the procedure by the  interventional radiology nurse under my direct supervision. FLUOROSCOPY TIME:  Fluoroscopy Time: 1 minutes 48 seconds (182 mGy). COMPLICATIONS: None immediate. PROCEDURE: Informed written consent was obtained from the patient after a thorough discussion of the procedural risks, benefits and alternatives. All questions were addressed. Maximal Sterile Barrier Technique was utilized including caps, mask, sterile gowns, sterile gloves, sterile drape, hand hygiene and skin antiseptic. A timeout was performed prior to the initiation of the procedure. Maximal barrier sterile technique utilized including caps, mask, sterile gowns, sterile gloves, large sterile drape, hand hygiene, and Betadine prep. Under sterile condition and local anesthesia, right internal jugular venous access was performed with ultrasound. An ultrasound image was saved and sent to PACS. Over a guidewire, the IVC filter delivery sheath and inner dilator were advanced into the IVC just above the IVC bifurcation. Contrast injection was performed for an IVC venogram. Through the delivery sheath, a retrievable Denali IVC filter was deployed below the level of the renal veins and above the IVC bifurcation. Limited post deployment venacavagram was performed. The delivery sheath was removed and hemostasis was obtained with manual compression. A dressing was placed. The patient tolerated the procedure well without immediate post procedural complication. FINDINGS: The IVC is patent. No evidence of thrombus, stenosis, or occlusion. No variant venous anatomy. Successful placement of the IVC filter below the level of the renal veins. IMPRESSION: Successful ultrasound and fluoroscopically guided placement of an infrarenal retrievable IVC filter via right jugular approach. PLAN: This IVC filter is potentially retrievable. The patient will be assessed for filter retrieval by Interventional Radiology in approximately 8-12 weeks. Further recommendations regarding filter  retrieval, continued surveillance or declaration of device permanence, will be made at that time. Signed, Criselda Peaches, MD [facility] Radiology Electronically Signed   By: Jacqulynn Cadet M.D.   On:  06/30/2016 16:59   Dg Chest Port 1 View  Result Date: 07/01/2016 CLINICAL DATA:  Pneumonia. EXAM: PORTABLE CHEST 1 VIEW COMPARISON:  06/29/2016. FINDINGS: PowerPort catheter noted with lead tip projected over the right atrium. Heart size stable. Bilateral upper lobe and right base infiltrates again noted. Slight progression from prior exam. No prominent pleural effusion or pneumothorax. IMPRESSION: 1. PowerPort catheter in stable position . 2. Bilateral upper lobe and right base infiltrates. Slight progression from prior exam Electronically Signed   By: Marcello Moores  Register   On: 07/01/2016 06:51    Anti-infectives: Anti-infectives    Start     Dose/Rate Route Frequency Ordered Stop   06/30/16 0830  metroNIDAZOLE (FLAGYL) IVPB 500 mg     500 mg 100 mL/hr over 60 Minutes Intravenous Every 8 hours 06/30/16 0735     06/29/16 1800  meropenem (MERREM) 1 g in sodium chloride 0.9 % 100 mL IVPB     1 g 200 mL/hr over 30 Minutes Intravenous Every 8 hours 06/29/16 1552     06/29/16 1430  levofloxacin (LEVAQUIN) IVPB 750 mg  Status:  Discontinued     750 mg 100 mL/hr over 90 Minutes Intravenous Every 24 hours 06/29/16 1355 06/29/16 1543   06/25/16 1000  levofloxacin (LEVAQUIN) tablet 500 mg  Status:  Discontinued     500 mg Oral Daily 06/24/16 2026 06/25/16 0941      Assessment/Plan: s/p * No surgery found * ok for clears.  Continue abx for colitis Hemodynamics slowly improving Will follow. No sign of obstruction at the moment  LOS: 7 days    TOTH III,PAUL S 07/01/2016

## 2016-07-01 NOTE — Progress Notes (Signed)
OT Cancellation Note  Patient Details Name: [name] MRN: [id number] DOB: 04-Nov-1940   Cancelled Treatment:    Reason Eval/Treat Not Completed: Medical issues which prohibited therapy   Spoke with pt and MD.  Pts BP 89/50 and continuing to work on BP control. Will check on for OT next day.  Kari Baars, [place] [name] 07/01/2016, 10:51 AM

## 2016-07-01 NOTE — Progress Notes (Addendum)
PT Cancellation Note  Patient Details Name: KEIANDRE CYGAN MRN: [id number] DOB: Sep 09, 1940   Cancelled Treatment:    Reason Eval/Treat Not Completed: Medical issues which prohibited therapy (not ready for mobility at this time per OT who checked with patient , Noted low BP. Hgb 7.4. Will check back another time.)   Claretha Cooper 07/01/2016, 10:43 AM Tresa Endo PT [phone]

## 2016-07-02 DIAGNOSIS — E876 Hypokalemia: Secondary | ICD-10-CM

## 2016-07-02 LAB — BASIC METABOLIC PANEL
ANION GAP: 5 (ref 5–15)
BUN: 12 mg/dL (ref 6–20)
CALCIUM: 6.9 mg/dL — AB (ref 8.9–10.3)
CHLORIDE: 105 mmol/L (ref 101–111)
CO2: 27 mmol/L (ref 22–32)
Creatinine, Ser: 0.6 mg/dL — ABNORMAL LOW (ref 0.61–1.24)
GFR calc non Af Amer: >60 mL/min (ref >60)
GLUCOSE: 161 mg/dL — AB (ref 65–99)
POTASSIUM: 3.4 mmol/L — AB (ref 3.5–5.1)
Sodium: 137 mmol/L (ref 135–145)

## 2016-07-02 LAB — CBC
HEMATOCRIT: 21 % — AB (ref 39.0–52.0)
Hemoglobin: 7.2 g/dL — ABNORMAL LOW (ref 13.0–17.0)
MCH: 32.7 pg (ref 26.0–34.0)
MCHC: 34.3 g/dL (ref 30.0–36.0)
MCV: 95.5 fL (ref 78.0–100.0)
Platelets: 46 10*3/uL — ABNORMAL LOW (ref 150–400)
RBC: 2.2 MIL/uL — AB (ref 4.22–5.81)
RDW: 17.7 % — ABNORMAL HIGH (ref 11.5–15.5)
WBC: 8.4 10*3/uL (ref 4.0–10.5)

## 2016-07-02 LAB — GLUCOSE, CAPILLARY
GLUCOSE-CAPILLARY: 153 mg/dL — AB (ref 65–99)
GLUCOSE-CAPILLARY: 166 mg/dL — AB (ref 65–99)
GLUCOSE-CAPILLARY: 110 mg/dL — AB (ref 65–99)
GLUCOSE-CAPILLARY: 115 mg/dL — AB (ref 65–99)
GLUCOSE-CAPILLARY: 153 mg/dL — AB (ref 65–99)

## 2016-07-02 LAB — PROCALCITONIN: Procalcitonin: <0.1 ng/mL

## 2016-07-02 MED ORDER — POTASSIUM CHLORIDE CRYS ER 20 MEQ PO TBCR
40.0000 meq | EXTENDED_RELEASE_TABLET | Freq: Three times a day (TID) | ORAL | Status: AC
  Administered 2016-07-02 (×2): 40 meq via ORAL
  Filled 2016-07-02 (×2): qty 2

## 2016-07-02 NOTE — Progress Notes (Signed)
Pt's HR went down into the 30's intermittently 3x and increased back up to mid 60's. Aroused pt and he was alert, oriented, asymptomatic. BP 139/69. Notified ELink.  Will continue to monitor.

## 2016-07-02 NOTE — Progress Notes (Signed)
Subjective: Hypotensive this am but feels good  Objective: Vital signs in last 24 hours: Temp:  [97.7 F (36.5 C)] 97.7 F (36.5 C) (12/22 1600) Pulse Rate:  [50-83] 55 (12/23 0645) Resp:  [11-21] 15 (12/23 0645) BP: (81-147)/(41-96) 99/67 (12/23 0645) SpO2:  [88 %-98 %] 95 % (12/23 0645) Last BM Date: 06/29/16  Intake/Output from previous day: 12/22 0701 - 12/23 0700 In: 3458.2 [I.V.:2858.2; IV Piggyback:600] Out: 275 [Urine:275] Intake/Output this shift: No intake/output data recorded.  Resp: clear to auscultation bilaterally Cardio: regular rate and rhythm GI: soft, nontender. not distended  Lab Results:   Recent Labs  07/01/16 0317 07/02/16 0500  WBC 7.5 8.4  HGB 7.4* 7.2*  HCT 22.3* 21.0*  PLT 45* 46*   BMET  Recent Labs  07/01/16 0317 07/02/16 0500  NA 143 137  K 3.2* 3.4*  CL 111 105  CO2 28 27  GLUCOSE 147* 161*  BUN 11 12  CREATININE 0.80 0.60*  CALCIUM 7.3* 6.9*   PT/INR  Recent Labs  06/30/16 1132  LABPROT 19.3*  INR 1.61   ABG No results for input(s): PHART, HCO3 in the last 72 hours.  Invalid input(s): PCO2, PO2  Studies/Results: Ir Ivc Filter Plmt / S&i /img Guid/mod Sed  Result Date: 06/30/2016 INDICATION: 75 year old male with a history of prior DVT and pulmonary embolism currently on anticoagulation via Eliquis. Unfortunately, he developed bright red blood per rectum recently and CT imaging suggests colitis. Therefore, his anticoagulation has been stopped. Repeat bilateral lower extremity ultrasound demonstrates bilateral acute DVT. Therefore, he requires caval interruption for PE prophylaxis. Of note, he has a history of multiple myeloma end stage IV lung adenocarcinoma. EXAM: ULTRASOUND GUIDANCE FOR VASCULARACCESS IVC CATHETERIZATION AND VENOGRAM IVC FILTER INSERTION Interventional Radiologist:  Criselda Peaches, MD MEDICATIONS: None. ANESTHESIA/SEDATION: Fentanyl 50 mcg IV; Versed 2.5 mg IV Moderate Sedation Time:  11  minutes The patient was continuously monitored during the procedure by the interventional radiology nurse under my direct supervision. FLUOROSCOPY TIME:  Fluoroscopy Time: 1 minutes 48 seconds (182 mGy). COMPLICATIONS: None immediate. PROCEDURE: Informed written consent was obtained from the patient after a thorough discussion of the procedural risks, benefits and alternatives. All questions were addressed. Maximal Sterile Barrier Technique was utilized including caps, mask, sterile gowns, sterile gloves, sterile drape, hand hygiene and skin antiseptic. A timeout was performed prior to the initiation of the procedure. Maximal barrier sterile technique utilized including caps, mask, sterile gowns, sterile gloves, large sterile drape, hand hygiene, and Betadine prep. Under sterile condition and local anesthesia, right internal jugular venous access was performed with ultrasound. An ultrasound image was saved and sent to PACS. Over a guidewire, the IVC filter delivery sheath and inner dilator were advanced into the IVC just above the IVC bifurcation. Contrast injection was performed for an IVC venogram. Through the delivery sheath, a retrievable Denali IVC filter was deployed below the level of the renal veins and above the IVC bifurcation. Limited post deployment venacavagram was performed. The delivery sheath was removed and hemostasis was obtained with manual compression. A dressing was placed. The patient tolerated the procedure well without immediate post procedural complication. FINDINGS: The IVC is patent. No evidence of thrombus, stenosis, or occlusion. No variant venous anatomy. Successful placement of the IVC filter below the level of the renal veins. IMPRESSION: Successful ultrasound and fluoroscopically guided placement of an infrarenal retrievable IVC filter via right jugular approach. PLAN: This IVC filter is potentially retrievable. The patient will be assessed for filter retrieval  by Interventional  Radiology in approximately 8-12 weeks. Further recommendations regarding filter retrieval, continued surveillance or declaration of device permanence, will be made at that time. Signed, Criselda Peaches, MD [facility] Radiology Electronically Signed   By: Jacqulynn Cadet M.D.   On: 06/30/2016 16:59   Dg Chest Port 1 View  Result Date: 07/01/2016 CLINICAL DATA:  Pneumonia. EXAM: PORTABLE CHEST 1 VIEW COMPARISON:  06/29/2016. FINDINGS: PowerPort catheter noted with lead tip projected over the right atrium. Heart size stable. Bilateral upper lobe and right base infiltrates again noted. Slight progression from prior exam. No prominent pleural effusion or pneumothorax. IMPRESSION: 1. PowerPort catheter in stable position . 2. Bilateral upper lobe and right base infiltrates. Slight progression from prior exam Electronically Signed   By: Marcello Moores  Register   On: 07/01/2016 06:51    Anti-infectives: Anti-infectives    Start     Dose/Rate Route Frequency Ordered Stop   06/30/16 0830  metroNIDAZOLE (FLAGYL) IVPB 500 mg     500 mg 100 mL/hr over 60 Minutes Intravenous Every 8 hours 06/30/16 0735     06/29/16 1800  meropenem (MERREM) 1 g in sodium chloride 0.9 % 100 mL IVPB     1 g 200 mL/hr over 30 Minutes Intravenous Every 8 hours 06/29/16 1552     06/29/16 1430  levofloxacin (LEVAQUIN) IVPB 750 mg  Status:  Discontinued     750 mg 100 mL/hr over 90 Minutes Intravenous Every 24 hours 06/29/16 1355 06/29/16 1543   06/25/16 1000  levofloxacin (LEVAQUIN) tablet 500 mg  Status:  Discontinued     500 mg Oral Daily 06/24/16 2026 06/25/16 0941      Assessment/Plan: s/p * No surgery found * Would stay with liquids for now with hypotension and colitis  Continue abx Critical care per CCM  LOS: 8 days    TOTH III,PAUL S 07/02/2016

## 2016-07-02 NOTE — Progress Notes (Signed)
Physical Therapy Treatment Patient Details Name: [name] MRN: [id number] DOB: 05-Jan-1941 Today's Date: 16-Jul-2016    History of Present Illness [name] is a 75 y.o. male with medical history significant of recently diagnosed stage IV NSCLC (80% PDL1+) being treated with pembrolizomab [name], immunotherapy), recent SBO (thought to be due to adhesions), DVT, multifocal PE (with possible HIT) on Eliquis, Gerd, multiple myeloma with skull lesion (s/p of radiation therapy 2016), chronic prostatitis, BPH, dCHF, diabetes insipidus on DDAVP, who presents with shortness of breath abdominal pain.    PT Comments    Pt cooperative and wanting to mobilize but ltd this date by orthostatic BP with attempts OOB.  RN present  Follow Up Recommendations  CIR     Equipment Recommendations  None recommended by PT    Recommendations for Other Services OT consult     Precautions / Restrictions Precautions Precautions: Fall Restrictions Weight Bearing Restrictions: No    Mobility  Bed Mobility Overal bed mobility: Needs Assistance Bed Mobility: Sit to Supine Rolling: Min assist   Supine to sit: Min assist;+2 for safety/equipment Sit to supine: Min assist;+2 for safety/equipment      Transfers                 General transfer comment: not attempted due to BP  Ambulation/Gait             General Gait Details: NT 2* BP drop with move to sitting   Stairs            Wheelchair Mobility    Modified Rankin (Stroke Patients Only)       Balance     Sitting balance-Leahy Scale: Fair                              Cognition Arousal/Alertness: Awake/alert Behavior During Therapy: WFL for tasks assessed/performed                   General Comments: oriented; needed a second cue at times--OT/PT and RN in room    Exercises      General Comments        Pertinent Vitals/Pain Pain Assessment: No/denies pain    Home Living                       Prior Function            PT Goals (current goals can now be found in the care plan section) Acute Rehab PT Goals Patient Stated Goal: rehab at [facility] PT Goal Formulation: With patient Time For Goal Achievement: 07/13/16 Potential to Achieve Goals: Fair Progress towards PT goals: Progressing toward goals    Frequency    Min 3X/week      PT Plan Current plan remains appropriate    Co-evaluation PT/OT/SLP Co-Evaluation/Treatment: Yes Reason for Co-Treatment: Complexity of the patient's impairments (multi-system involvement);For patient/therapist safety PT goals addressed during session: Mobility/safety with mobility OT goals addressed during session: ADL's and self-care     End of Session   Activity Tolerance: Patient limited by fatigue;Other (comment) Patient left: in bed;with call bell/phone within reach;with bed alarm set     Time: 1130-1153 PT Time Calculation (min) (ACUTE ONLY): 23 min  Charges:  $Therapeutic Activity: 8-22 mins                    G Codes:      [name] 07-16-2016,  12:40 PM

## 2016-07-02 NOTE — Progress Notes (Signed)
Occupational Therapy Treatment Patient Details Name: [name] MRN: [id number] DOB: [birth year] Today's Date: 07/02/2016    History of present illness [name] is a 75 y.o. male with medical history significant of recently diagnosed stage IV NSCLC (80% PDL1+) being treated with pembrolizomab [name], immunotherapy), recent SBO (thought to be due to adhesions), DVT, multifocal PE (with possible HIT) on Eliquis, Gerd, multiple myeloma with skull lesion (s/p of radiation therapy 2016), chronic prostatitis, BPH, dCHF, diabetes insipidus on DDAVP, who presents with shortness of breath abdominal pain.   OT comments  Pt very motivated and looking forward to moving/tolerating more activity but limited by BP.  When pt sat up, BP dropped to 77/40.  See vitals section of chart for BPs during this session  Follow Up Recommendations  CIR    Equipment Recommendations   (to be further assessed)    Recommendations for Other Services      Precautions / Restrictions Precautions Precautions: Fall Restrictions Weight Bearing Restrictions: No       Mobility Bed Mobility     Rolling: Min assist   Supine to sit: Min assist;+2 for safety/equipment (for lines) Sit to supine: Min assist;+2 for safety/equipment (for lines)      Transfers                 General transfer comment: not attempted due to BP    Balance     Sitting balance-Leahy Scale: Fair                             ADL                                         General ADL Comments: see vitals section of chart.  Pt sat EOB briefly (for BP to be taken, and it dropped significantly and pt was symptomatic.  Returned to supine.  BP raised some, but not up to baseline.  Pt was not up long enough for adl activities.  Rolled to place new chux under him and reposition up in the bed      Vision                     Perception     Praxis      Cognition   Behavior During  Therapy: [facility] for tasks assessed/performed                    General Comments: oriented; needed a second cue at times--OT/PT and RN in room    Extremity/Trunk Assessment               Exercises     Shoulder Instructions       General Comments      Pertinent Vitals/ Pain       Pain Assessment: No/denies pain  Home Living                                          Prior Functioning/Environment              Frequency           Progress Toward Goals  OT Goals(current goals can now be found in the care plan section)  Progress towards OT goals: Progressing toward goals  Acute Rehab OT Goals Patient Stated Goal: rehab at [place]      Reason for Co-Treatment: Complexity of the patient's impairments (multi-system involvement) PT goals addressed during session: Mobility/safety with mobility OT goals addressed during session: ADL's and self-care      End of Session     Activity Tolerance Treatment limited secondary to medical complications (Comment) (BP)   Patient Left in bed;with call bell/phone within reach;with bed alarm set   Nurse Communication  (RN present)        Time: 4259-5638 OT Time Calculation (min): 23 min  Charges: OT General Charges $OT Visit: 1 Procedure OT Treatments $Therapeutic Activity: 8-22 mins  [employer] Schwanke 07/02/2016, 12:25 PM  Lesle Chris, OTR/L [phone] 07/02/2016

## 2016-07-02 NOTE — Progress Notes (Signed)
PULMONARY / CRITICAL CARE MEDICINE   Name: [name] MRN: [id number] DOB: 22-Aug-1940    ADMISSION DATE:  06/24/2016 CONSULTATION DATE:  06/29/16  REFERRING MD:  Katheran James, MD  CHIEF COMPLAINT:  Sepsis, gram-negative rod bacteremia  HISTORY OF PRESENT ILLNESS:   75 year old with stage IV adenocarcinoma, arthritis, BPH, DVT. He has diagnosis of stage IVB (T1b, N2, M1c) non-small cell lung cancer, adenocarcinoma with metastases to the brain diagnosed in October 2017. He was undergoing chemotherapy with Prembrolizomab and recent dx of recurrent SBO, PE on eliquis who was admitted on 12/15 with severe abdominal pain. He was assessed by surgery who recommended conservative management with IVF and NG decompression.  On 06/29/16 he developed nausea and projectile vomiting, decreased blood pressure. Noted to have gram-negative rod bacteremia. He was transferred to stepdown for further management and PCCM consulted.  SUBJECTIVE:  No events overnight, light headed when BP drops, no new complaints  VITAL SIGNS: BP 107/67 (BP Location: Right Arm)   Pulse (!) 58   Temp 98.6 F (37 C) (Oral)   Resp 14   Ht _0  (1.778 m)   Wt 92.2 kg (203 lb 4.2 oz)   SpO2 96%   BMI 29.17 kg/m   HEMODYNAMICS:    VENTILATOR SETTINGS:    INTAKE / OUTPUT: I/O last 3 completed shifts: In: 6309.8 [P.O.:720; I.V.:4689.8; IV Piggyback:900] Out: 1250 [Urine:1250]  PHYSICAL EXAMINATION: General:  Awake, alert, oriented, no pain  Neuro:  No focal deficits. Oriented X 4 HEENT:  Dry mucous membranes, no thyromegaly, JVD Cardiovascular:  Regular rate and rhythm, no MRG; occ PACs Lungs:  Clear, no wheeze, crackles, no accessory use  Abdomen:  Distended, absent bowel sounds Musculoskeletal:  Normal tone and bulk  LABS:  BMET  Recent Labs Lab 06/30/16 2247 07/01/16 0317 07/02/16 0500  NA 150* 143 137  K 3.0* 3.2* 3.4*  CL 113* 111 105  CO2 _1 BUN _2 CREATININE 0.89 0.80 0.60*   GLUCOSE 171* 147* 161*    Electrolytes  Recent Labs Lab 06/30/16 2247 07/01/16 0317 07/02/16 0500  CALCIUM 7.6* 7.3* 6.9*    CBC  Recent Labs Lab 06/30/16 2247 07/01/16 0317 07/02/16 0500  WBC 7.3 7.5 8.4  HGB 7.5* 7.4* 7.2*  HCT 22.5* 22.3* 21.0*  PLT 50* 45* 46*    Coag's  Recent Labs Lab 06/26/16 2315 06/27/16 0514 06/28/16 0535 06/30/16 1132  APTT 53* 53* 57*  --   INR  --   --   --  1.61    Sepsis Markers  Recent Labs Lab 06/29/16 1507 06/29/16 2125 06/30/16 1033 06/30/16 1132 07/01/16 0317 07/02/16 0500  LATICACIDVEN 2.5* 2.3*  --  2.1*  --   --   PROCALCITON  --   --  0.25  --  0.14 <0.10    ABG No results for input(s): PHART, PCO2ART, PO2ART in the last 168 hours.  Liver Enzymes No results for input(s): AST, ALT, ALKPHOS, BILITOT, ALBUMIN in the last 168 hours.  Cardiac Enzymes No results for input(s): TROPONINI, PROBNP in the last 168 hours.  Glucose  Recent Labs Lab 07/01/16 0722 07/01/16 1248 07/01/16 1553 07/01/16 1943 07/01/16 2205 07/02/16 0737  GLUCAP 116* 124* 118* 168* 167* 153*    Imaging No results found.  STUDIES:  Venous US 12/21>>>Findings consistent with acute deep vein thrombosis involving peroneal, popliteal, and distal femoral arteryof the right lower extremity. - Findings consistent with acute deep vein thrombosis involving the  popliteal and gastrocnemius veins of the left lower extremity. - Findings consistent with chronic deep vein thrombosis involving   them proximal posterior tibial vein of the right lower extremity. - No evidence of a superficial thrombosis of the right or left   lower extremity. CT abd/pelvis 12/20: Persistently dilated fluid and gas filled small bowel loops to level of the right lower quadrant/ distal ilium with abrupt transition without mass identified and therefore may be related to stricture/adhesion.Interval development of abnormal thickening of the ascending colon and  proximal descending colon with surrounding inflammation of fat planes suggesting colitis whether inflammatory infectious or ischemic.  CULTURES: BCX2 12/15: bacteroides vulgatus BCX2 12/20>>>  ANTIBIOTICS: Merrem 12/20>>> Flagyl 12/21>>>  SIGNIFICANT EVENTS:   LINES/TUBES:   DISCUSSION: 75 yo w/ NSCLC, recurrent bowel obstruction, PE on NOAC. Admitted w/ recurrent SBO & GNR bacteremia on 12/15. PCCM asked to see on 12/20 when he became progressively hypotensive. CT imaging of abd raising concern for ischemia vs infectious colitis. New acute LE DVT in-spite of NOAC, which we had to stop 12/21 d/t colitis related GIB and hgb drop of almost 2 gms. Now s/p IVC filter. Surgery following and recommends against surgery.   ASSESSMENT / PLAN:  PULMONARY A: Cough  Possible Aspiration  ->CXR w/ some progression of infiltrates suspect he has aspiration event when vomiting. Clinically had cough following the event  P:   O2 as needed, titrate down as able  CARDIOVASCULAR A:  Severe sepsis/septic shock. LA stable.  End-organ fxn remains intact Still pressor dep but weaning  P:  Cont IVFs  Cont stress dose steroids, no taper given pressor requirement. Hold any antihypertensives and diuretics  Tele   RENAL A:   H/o BPH  Chronic prostatitis  Hypernatremia -->improved w/ free water replacement  Hyperchloremia H/o DI  Hypokalemia   P:   MAP goal >65 Replace K On dextrose, will d/c now taking PO D/C free water Strict I&O Renal dose meds . Cont DDAVP F/u chem in am   GASTROINTESTINAL A:   SBO New colitis ? Ischemic vs infectious-->favor ischemia in setting of SBO Projectile emesis (resolved) -->surgical services has seen and also reviewed CT imaging. They recommend against surgery  P:   Diet per surgical services   PPI See ID section   HEMATOLOGIC A:   NSCLC (80% PDL1+)-->treateing w/ [name] H/o PE and DVT on Eliquis-->new DVTs on NOAC 12/21 Possible HIT H/o  Multiple Myeloma w/ skull lesion s/p XRT 2016) Blood loss anemia  P:  Trend CBC Stopped NOAC for now. Temp Filter placed 12/21. When he gets to a point where anticoagulation can be resumed he will need heme to weigh in; ideally would be better off on LMWH. There was concern about HIT in past but was a clinical concern not objectively studied.   INFECTIOUS A:   Bacteroides Vulgatus  bacteremia (beta lactamase +)-->suspect transient bacterial translocation  Possible aspiration  Colitis ischemic vs infectious -->favor ischemia  P:   Merrem (PCN allergy).  IM started flagyl. Cdiff pending. if negative stop as merrem has excellent anaerobic coverage PCT alogrithm noted See CV section   ENDOCRINE A:   H/o DI P:   Cont DDAVP Stress dose steroids for hypotension, no taper  NEUROLOGIC A:   No acute  P:   RASS goal: 0 PRN analgesia   FAMILY  - Updates: Patient updated bedside - Inter-disciplinary family meet or Palliative Care meeting due by:  07/06/16  The patient is critically ill with multiple  organ systems failure and requires high complexity decision making for assessment and support, frequent evaluation and titration of therapies, application of advanced monitoring technologies and extensive interpretation of multiple databases.   Critical Care Time devoted to patient care services described in this note is  35  Minutes. This time reflects time of care of this signee Dr Jennet Maduro. This critical care time does not reflect procedure time, or teaching time or supervisory time of PA/NP/Med student/Med Resident etc but could involve care discussion time.  Rush Farmer, M.D. [facility] Pulmonary/Critical Care Medicine. Pager: [phone]. After hours pager: [phone].  07/02/2016, 9:57 AM

## 2016-07-03 LAB — CBC
HCT: 21.8 % — ABNORMAL LOW (ref 39.0–52.0)
Hemoglobin: 7.5 g/dL — ABNORMAL LOW (ref 13.0–17.0)
MCH: 32.5 pg (ref 26.0–34.0)
MCHC: 34.4 g/dL (ref 30.0–36.0)
MCV: 94.4 fL (ref 78.0–100.0)
PLATELETS: 47 10*3/uL — AB (ref 150–400)
RBC: 2.31 MIL/uL — AB (ref 4.22–5.81)
RDW: 17.4 % — ABNORMAL HIGH (ref 11.5–15.5)
WBC: 7.2 10*3/uL (ref 4.0–10.5)

## 2016-07-03 LAB — BASIC METABOLIC PANEL
ANION GAP: 5 (ref 5–15)
BUN: 11 mg/dL (ref 6–20)
CO2: 27 mmol/L (ref 22–32)
Calcium: 7 mg/dL — ABNORMAL LOW (ref 8.9–10.3)
Chloride: 102 mmol/L (ref 101–111)
Creatinine, Ser: 0.53 mg/dL — ABNORMAL LOW (ref 0.61–1.24)
GFR calc Af Amer: >60 mL/min (ref >60)
Glucose, Bld: 142 mg/dL — ABNORMAL HIGH (ref 65–99)
POTASSIUM: 3.3 mmol/L — AB (ref 3.5–5.1)
SODIUM: 134 mmol/L — AB (ref 135–145)

## 2016-07-03 LAB — GLUCOSE, CAPILLARY
Glucose-Capillary: 121 mg/dL — ABNORMAL HIGH (ref 65–99)
Glucose-Capillary: 169 mg/dL — ABNORMAL HIGH (ref 65–99)
Glucose-Capillary: 113 mg/dL — ABNORMAL HIGH (ref 65–99)

## 2016-07-03 LAB — MAGNESIUM: MAGNESIUM: 1.6 mg/dL — AB (ref 1.7–2.4)

## 2016-07-03 LAB — PHOSPHORUS: Phosphorus: 1.4 mg/dL — ABNORMAL LOW (ref 2.5–4.6)

## 2016-07-03 MED ORDER — POTASSIUM CHLORIDE CRYS ER 20 MEQ PO TBCR
40.0000 meq | EXTENDED_RELEASE_TABLET | Freq: Three times a day (TID) | ORAL | Status: AC
  Administered 2016-07-03 (×2): 40 meq via ORAL
  Filled 2016-07-03 (×2): qty 2

## 2016-07-03 MED ORDER — FUROSEMIDE 10 MG/ML IJ SOLN
20.0000 mg | Freq: Four times a day (QID) | INTRAMUSCULAR | Status: AC
  Administered 2016-07-03 (×3): 20 mg via INTRAVENOUS
  Filled 2016-07-03 (×3): qty 2

## 2016-07-03 MED ORDER — OXYCODONE-ACETAMINOPHEN 5-325 MG PO TABS
1.0000 | ORAL_TABLET | ORAL | Status: DC | PRN
  Administered 2016-07-03 – 2016-07-08 (×12): 1 via ORAL
  Filled 2016-07-03 (×12): qty 1

## 2016-07-03 MED ORDER — MIDODRINE HCL 5 MG PO TABS
5.0000 mg | ORAL_TABLET | Freq: Three times a day (TID) | ORAL | Status: DC
  Administered 2016-07-03 – 2016-07-04 (×3): 5 mg via ORAL
  Filled 2016-07-03 (×3): qty 1

## 2016-07-03 NOTE — Progress Notes (Signed)
PULMONARY / CRITICAL CARE MEDICINE   Name: [name] MRN: [id number] DOB: Nov 05, 1940    ADMISSION DATE:  06/24/2016 CONSULTATION DATE:  06/29/16  REFERRING MD:  Katheran James, MD  CHIEF COMPLAINT:  Sepsis, gram-negative rod bacteremia  HISTORY OF PRESENT ILLNESS:   75 year old with stage IV adenocarcinoma, arthritis, BPH, DVT. He has diagnosis of stage IVB (T1b, N2, M1c) non-small cell lung cancer, adenocarcinoma with metastases to the brain diagnosed in October 2017. He was undergoing chemotherapy with Prembrolizomab and recent dx of recurrent SBO, PE on eliquis who was admitted on 12/15 with severe abdominal pain. He was assessed by surgery who recommended conservative management with IVF and NG decompression.  On 06/29/16 he developed nausea and projectile vomiting, decreased blood pressure. Noted to have gram-negative rod bacteremia. He was transferred to stepdown for further management and PCCM consulted.  SUBJECTIVE:  No events overnight, c/o lightheadedness when standing.  VITAL SIGNS: BP 99/80 (BP Location: Right Arm)   Pulse 81   Temp 97.4 F (36.3 C) (Oral)   Resp 17   Ht '5\' 10"'$  (1.778 m)   Wt 95.2 kg (209 lb 14.1 oz)   SpO2 95%   BMI 30.11 kg/m   HEMODYNAMICS:    VENTILATOR SETTINGS:    INTAKE / OUTPUT: I/O last 3 completed shifts: In: 4711.2 [I.V.:3811.2; IV [id number]:182] Out: 550 [Urine:550]  PHYSICAL EXAMINATION: General:  Awake, alert, oriented, no pain  Neuro:  No focal deficits. Oriented X 4 HEENT:  Dry mucous membranes, no thyromegaly, JVD Cardiovascular:  Regular rate and rhythm, no MRG; occ PACs Lungs:  Clear, no wheeze, crackles, no accessory use  Abdomen:  Distended, absent bowel sounds Musculoskeletal:  Normal tone and bulk  LABS:  BMET  Recent Labs Lab 07/01/16 0317 07/02/16 0500 07/03/16 0517  NA 143 137 134*  K 3.2* 3.4* 3.3*  CL 111 105 102  CO2 '28 27 27  '$ BUN '11 12 11  '$ CREATININE 0.80 0.60* 0.53*  GLUCOSE 147* 161* 142*     Electrolytes  Recent Labs Lab 07/01/16 0317 07/02/16 0500 07/03/16 0517  CALCIUM 7.3* 6.9* 7.0*  MG  --   --  1.6*  PHOS  --   --  1.4*    CBC  Recent Labs Lab 07/01/16 0317 07/02/16 0500 07/03/16 0517  WBC 7.5 8.4 7.2  HGB 7.4* 7.2* 7.5*  HCT 22.3* 21.0* 21.8*  PLT 45* 46* 47*    Coag's  Recent Labs Lab 06/26/16 2315 06/27/16 0514 06/28/16 0535 06/30/16 1132  APTT 53* 53* 57*  --   INR  --   --   --  1.61    Sepsis Markers  Recent Labs Lab 06/29/16 1507 06/29/16 2125 06/30/16 1033 06/30/16 1132 07/01/16 0317 07/02/16 0500  LATICACIDVEN 2.5* 2.3*  --  2.1*  --   --   PROCALCITON  --   --  0.25  --  0.14 <0.10    ABG No results for input(s): PHART, PCO2ART, PO2ART in the last 168 hours.  Liver Enzymes No results for input(s): AST, ALT, ALKPHOS, BILITOT, ALBUMIN in the last 168 hours.  Cardiac Enzymes No results for input(s): TROPONINI, PROBNP in the last 168 hours.  Glucose  Recent Labs Lab 07/02/16 0737 07/02/16 1205 07/02/16 1531 07/02/16 1643 07/02/16 2122 07/03/16 0735  GLUCAP 153* 166* 110* 115* 153* 121*    Imaging No results found.  STUDIES:  Venous US 12/21>>>Findings consistent with acute deep vein thrombosis involving peroneal, popliteal, and distal femoral arteryof the right lower  extremity. - Findings consistent with acute deep vein thrombosis involving the   popliteal and gastrocnemius veins of the left lower extremity. - Findings consistent with chronic deep vein thrombosis involving   them proximal posterior tibial vein of the right lower extremity. - No evidence of a superficial thrombosis of the right or left   lower extremity. CT abd/pelvis 12/20: Persistently dilated fluid and gas filled small bowel loops to level of the right lower quadrant/ distal ilium with abrupt transition without mass identified and therefore may be related to stricture/adhesion.Interval development of abnormal thickening of the  ascending colon and proximal descending colon with surrounding inflammation of fat planes suggesting colitis whether inflammatory infectious or ischemic.  CULTURES: BCX2 12/15: bacteroides vulgatus BCX2 12/20>>>  ANTIBIOTICS: Merrem 12/20>>> Flagyl 12/21>>>  SIGNIFICANT EVENTS:   LINES/TUBES:   DISCUSSION: 75 yo w/ NSCLC, recurrent bowel obstruction, PE on NOAC. Admitted w/ recurrent SBO & GNR bacteremia on 12/15. PCCM asked to see on 12/20 when he became progressively hypotensive. CT imaging of abd raising concern for ischemia vs infectious colitis. New acute LE DVT in-spite of NOAC, which we had to stop 12/21 d/t colitis related GIB and hgb drop of almost 2 gms. Now s/p IVC filter. Surgery following and recommends against surgery.   ASSESSMENT / PLAN:  PULMONARY A: Cough  Possible Aspiration  ->CXR w/ some progression of infiltrates suspect he has aspiration event when vomiting. Clinically had cough following the event  P:   O2 as needed, titrate down as able  CARDIOVASCULAR A:  Severe sepsis/septic shock. LA stable.  End-organ fxn remains intact Still pressor dep but weaning  P:  KVO IVFs  Cont stress dose steroids, no taper given pressor requirement. Hold any antihypertensives and diuretics  Add midodrine 5 mg, if remains on pressors in AM will add midodrine Tele  Levophed for BP support.  RENAL A:   H/o BPH  Chronic prostatitis  Hypernatremia -->improved w/ free water replacement  Hyperchloremia H/o DI  Hypokalemia   P:   MAP goal >65 Replace K D/C free water Strict I&O D/C LR Lasix 20 mg IV q6 x3 doses Renal dose meds . Cont DDAVP F/u chem in am   GASTROINTESTINAL A:   SBO New colitis ? Ischemic vs infectious-->favor ischemia in setting of SBO Projectile emesis (resolved) -->surgical services has seen and also reviewed CT imaging. They recommend against surgery  P:   Diet per surgical services   PPI See ID section   HEMATOLOGIC A:    NSCLC (80% PDL1+)-->treateing w/ [name] H/o PE and DVT on Eliquis-->new DVTs on NOAC 12/21 Possible HIT H/o Multiple Myeloma w/ skull lesion s/p XRT 2016) Blood loss anemia  P:  Trend CBC Stopped NOAC for now. Temp Filter placed 12/21. When he gets to a point where anticoagulation can be resumed he will need heme to weigh in; ideally would be better off on LMWH. There was concern about HIT in past but was a clinical concern not objectively studied.   INFECTIOUS A:   Bacteroides Vulgatus  bacteremia (beta lactamase +)-->suspect transient bacterial translocation  Possible aspiration  Colitis ischemic vs infectious -->favor ischemia  P:   Merrem (PCN allergy).  IM started flagyl. Cdiff pending. if negative stop as merrem has excellent anaerobic coverage PCT alogrithm noted See CV section   ENDOCRINE A:   H/o DI P:   Cont DDAVP Stress dose steroids for hypotension, no taper  NEUROLOGIC A:   No acute  P:   RASS goal:  0 PRN analgesia  Percocet 1 tab q4PRN  FAMILY  - Updates: Patient updated bedside - Inter-disciplinary family meet or Palliative Care meeting due by:  07/06/16  The patient is critically ill with multiple organ systems failure and requires high complexity decision making for assessment and support, frequent evaluation and titration of therapies, application of advanced monitoring technologies and extensive interpretation of multiple databases.   Critical Care Time devoted to patient care services described in this note is  35  Minutes. This time reflects time of care of this signee Dr Jennet Maduro. This critical care time does not reflect procedure time, or teaching time or supervisory time of PA/NP/Med student/Med Resident etc but could involve care discussion time.  Rush Farmer, M.D. [facility] Pulmonary/Critical Care Medicine. Pager: [phone]. After hours pager: [phone].  07/03/2016, 10:33 AM

## 2016-07-03 NOTE — Progress Notes (Signed)
  Subjective: Resting comfortably. No complaints. Having bm's  Objective: Vital signs in last 24 hours: Temp:  [97.4 F (36.3 C)-98.6 F (37 C)] 97.6 F (36.4 C) (12/24 0400) Pulse Rate:  [55-81] 81 (12/24 0530) Resp:  [9-20] 19 (12/24 0530) BP: (85-143)/(50-89) 114/79 (12/24 0530) SpO2:  [91 %-97 %] 94 % (12/24 0530) Weight:  [95.2 kg (209 lb 14.1 oz)] 95.2 kg (209 lb 14.1 oz) (12/24 0500) Last BM Date: 07/02/16  Intake/Output from previous day: 12/23 0701 - 12/24 0700 In: 2425.2 [I.V.:1825.2; IV Piggyback:600] Out: 450 [Urine:450] Intake/Output this shift: Total I/O In: 1457.3 [I.V.:857.3; IV Piggyback:600] Out: 200 [Urine:200]  Resp: clear to auscultation bilaterally Cardio: regular rate and rhythm GI: soft, nontender. 3 bm's yesterday  Lab Results:   Recent Labs  07/02/16 0500 07/03/16 0517  WBC 8.4 7.2  HGB 7.2* 7.5*  HCT 21.0* 21.8*  PLT 46* 47*   BMET  Recent Labs  07/02/16 0500 07/03/16 0517  NA 137 134*  K 3.4* 3.3*  CL 105 102  CO2 27 27  GLUCOSE 161* 142*  BUN 12 11  CREATININE 0.60* 0.53*  CALCIUM 6.9* 7.0*   PT/INR  Recent Labs  06/30/16 1132  LABPROT 19.3*  INR 1.61   ABG No results for input(s): PHART, HCO3 in the last 72 hours.  Invalid input(s): PCO2, PO2  Studies/Results: No results found.  Anti-infectives: Anti-infectives    Start     Dose/Rate Route Frequency Ordered Stop   06/30/16 0830  metroNIDAZOLE (FLAGYL) IVPB 500 mg     500 mg 100 mL/hr over 60 Minutes Intravenous Every 8 hours 06/30/16 0735     06/29/16 1800  meropenem (MERREM) 1 g in sodium chloride 0.9 % 100 mL IVPB     1 g 200 mL/hr over 30 Minutes Intravenous Every 8 hours 06/29/16 1552     06/29/16 1430  levofloxacin (LEVAQUIN) IVPB 750 mg  Status:  Discontinued     750 mg 100 mL/hr over 90 Minutes Intravenous Every 24 hours 06/29/16 1355 06/29/16 1543   06/25/16 1000  levofloxacin (LEVAQUIN) tablet 500 mg  Status:  Discontinued     500 mg Oral  Daily 06/24/16 2026 06/25/16 0941      Assessment/Plan: s/p * No surgery found * Advance diet  No sign of obstruction Continue abx for colitis Still pressor dependent. Per CCM  LOS: 9 days    TOTH III,PAUL S 07/03/2016

## 2016-07-04 LAB — GLUCOSE, CAPILLARY
Glucose-Capillary: 155 mg/dL — ABNORMAL HIGH (ref 65–99)
Glucose-Capillary: 159 mg/dL — ABNORMAL HIGH (ref 65–99)
Glucose-Capillary: 138 mg/dL — ABNORMAL HIGH (ref 65–99)
Glucose-Capillary: 140 mg/dL — ABNORMAL HIGH (ref 65–99)
Glucose-Capillary: 116 mg/dL — ABNORMAL HIGH (ref 65–99)

## 2016-07-04 LAB — BASIC METABOLIC PANEL
Anion gap: 8 (ref 5–15)
BUN: 14 mg/dL (ref 6–20)
CALCIUM: 7.5 mg/dL — AB (ref 8.9–10.3)
CHLORIDE: 104 mmol/L (ref 101–111)
CO2: 27 mmol/L (ref 22–32)
CREATININE: 0.72 mg/dL (ref 0.61–1.24)
GFR calc non Af Amer: >60 mL/min (ref >60)
GLUCOSE: 170 mg/dL — AB (ref 65–99)
Potassium: 2.9 mmol/L — ABNORMAL LOW (ref 3.5–5.1)
Sodium: 139 mmol/L (ref 135–145)

## 2016-07-04 LAB — CBC
HEMATOCRIT: 25.6 % — AB (ref 39.0–52.0)
HEMOGLOBIN: 8.7 g/dL — AB (ref 13.0–17.0)
MCH: 32.3 pg (ref 26.0–34.0)
MCHC: 34 g/dL (ref 30.0–36.0)
MCV: 95.2 fL (ref 78.0–100.0)
Platelets: 57 10*3/uL — ABNORMAL LOW (ref 150–400)
RBC: 2.69 MIL/uL — ABNORMAL LOW (ref 4.22–5.81)
RDW: 17.8 % — ABNORMAL HIGH (ref 11.5–15.5)
WBC: 7.2 10*3/uL (ref 4.0–10.5)

## 2016-07-04 LAB — CULTURE, BLOOD (ROUTINE X 2)
CULTURE: NO GROWTH
CULTURE: NO GROWTH

## 2016-07-04 LAB — PHOSPHORUS: PHOSPHORUS: 1.7 mg/dL — AB (ref 2.5–4.6)

## 2016-07-04 LAB — MAGNESIUM: Magnesium: 1.7 mg/dL (ref 1.7–2.4)

## 2016-07-04 MED ORDER — MIDODRINE HCL 5 MG PO TABS
10.0000 mg | ORAL_TABLET | Freq: Three times a day (TID) | ORAL | Status: DC
  Administered 2016-07-04 – 2016-07-05 (×3): 10 mg via ORAL
  Filled 2016-07-04 (×3): qty 2

## 2016-07-04 MED ORDER — MAGNESIUM SULFATE 2 GM/50ML IV SOLN
2.0000 g | Freq: Once | INTRAVENOUS | Status: AC
  Administered 2016-07-04: 2 g via INTRAVENOUS
  Filled 2016-07-04: qty 50

## 2016-07-04 MED ORDER — POTASSIUM CHLORIDE CRYS ER 20 MEQ PO TBCR
40.0000 meq | EXTENDED_RELEASE_TABLET | Freq: Four times a day (QID) | ORAL | Status: AC
  Administered 2016-07-04 (×3): 40 meq via ORAL
  Filled 2016-07-04 (×3): qty 2

## 2016-07-04 MED ORDER — POTASSIUM PHOSPHATES 15 MMOLE/5ML IV SOLN
30.0000 mmol | Freq: Once | INTRAVENOUS | Status: AC
  Administered 2016-07-04: 30 mmol via INTRAVENOUS
  Filled 2016-07-04: qty 10

## 2016-07-04 MED ORDER — FUROSEMIDE 10 MG/ML IJ SOLN
20.0000 mg | Freq: Four times a day (QID) | INTRAMUSCULAR | Status: AC
  Administered 2016-07-04 – 2016-07-05 (×3): 20 mg via INTRAVENOUS
  Filled 2016-07-04 (×3): qty 2

## 2016-07-04 NOTE — Progress Notes (Signed)
Pharmacy Antibiotic Note  [name] is a 75 y.o. male admitted on 06/24/2016 with PE, recent admission for SBO also  hx of metastatic lung cancer.   Patient's currently on meropenem for colitis, suspected sepsis/bacteremia/aspiration.  Today, 07/04/2016: - Day #6 abx for Bacteroides Vulgatus bacteremia  - Afeb, wbc wnl - SCr ok (crcl~91) - PCT down  Plan: - Continue Meropenem 1gm IV q8h - Continue  flagyl 500 mg IV q8h for r/o cdiff (no specimen pending) - Follow renal function  _________________________________  Height: '5\' 10"'$  (177.8 cm) Weight: 204 lb 9.4 oz (92.8 kg) IBW/kg (Calculated) : 73  Temp (24hrs), Avg:97.8 F (36.6 C), Min:97.5 F (36.4 C), Max:98 F (36.7 C)   Recent Labs Lab 06/29/16 1507  06/29/16 2125  06/30/16 1132 06/30/16 2247 07/01/16 0317 07/02/16 0500 07/03/16 0517 07/04/16 0522  WBC  --   < >  --   < >  --  7.3 7.5 8.4 7.2 7.2  CREATININE  --   < >  --   < >  --  0.89 0.80 0.60* 0.53* 0.72  LATICACIDVEN 2.5*  --  2.3*  --  2.1*  --   --   --   --   --   < > = values in this interval not displayed.  Estimated Creatinine Clearance: 91.3 mL/min (by C-G formula based on SCr of 0.72 mg/dL).    Allergies  Allergen Reactions  . Tramadol Palpitations    "Made my chest feel tight"  . Amoxicillin     REACTION: rash  . Penicillins Rash    Has patient had a PCN reaction causing immediate rash, facial/tongue/throat swelling, SOB or lightheadedness with hypotension: unknown Has patient had a PCN reaction causing severe rash involving mucus membranes or skin necrosis: unknown Has patient had a PCN reaction that required hospitalization: no  Has patient had a PCN reaction occurring within the last 10 years: no  If all of the above answers are "NO", then may proceed with Cephalosporin use.    Antibiotics this Admission: 12/16 >> Levaquin x1, 12/20 x1 12/20 >>Meropenem >> 12/21 >> Flagyl >>   Microbiology results: 12/15 BCx x1: bacteroides  vulgatus BL (+) (no organisms detected on BCID) FINAL 12/16 MRSA PCR: neg 12/20 BCx x2: ngtd 12/20 CDiff PCR: needs collection  Thank you for allowing pharmacy to be a part of this patient's care.  Peggyann Juba, PharmD, BCPS Pager: [phone] 07/04/2016 10:51 AM

## 2016-07-04 NOTE — Progress Notes (Signed)
PULMONARY / CRITICAL CARE MEDICINE   Name: [name] MRN: [id number] DOB: Jan 01, 1941    ADMISSION DATE:  06/24/2016 CONSULTATION DATE:  06/29/16  REFERRING MD:  Katheran James, MD  CHIEF COMPLAINT:  Sepsis, gram-negative rod bacteremia  HISTORY OF PRESENT ILLNESS:   75 year old with stage IV adenocarcinoma, arthritis, BPH, DVT. He has diagnosis of stage IVB (T1b, N2, M1c) non-small cell lung cancer, adenocarcinoma with metastases to the brain diagnosed in October 2017. He was undergoing chemotherapy with Prembrolizomab and recent dx of recurrent SBO, PE on eliquis who was admitted on 12/15 with severe abdominal pain. He was assessed by surgery who recommended conservative management with IVF and NG decompression.  On 06/29/16 he developed nausea and projectile vomiting, decreased blood pressure. Noted to have gram-negative rod bacteremia. He was transferred to stepdown for further management and PCCM consulted.  SUBJECTIVE:  No events overnight, c/o pain at the site of the port that has been accessed for days now.  VITAL SIGNS: BP (!) 106/58   Pulse 85   Temp 97.5 F (36.4 C) (Oral)   Resp 20   Ht '5\' 10"'$  (1.778 m)   Wt 92.8 kg (204 lb 9.4 oz)   SpO2 97%   BMI 29.36 kg/m   HEMODYNAMICS:    VENTILATOR SETTINGS:    INTAKE / OUTPUT: I/O last 3 completed shifts: In: 2780.2 [I.V.:1780.2; IV Piggyback:1000] Out: 0177 [Urine:3360]  PHYSICAL EXAMINATION: General:  Awake, alert, oriented  Neuro:  No focal deficits. Oriented X 4 HEENT:  Dry mucous membranes, no thyromegaly, JVD Cardiovascular:  Regular rate and rhythm, no MRG; occ PACs Lungs:  Clear, no wheeze, crackles, no accessory use  Abdomen:  Distended, absent bowel sounds Musculoskeletal:  Normal tone and bulk  LABS:  BMET  Recent Labs Lab 07/02/16 0500 07/03/16 0517 07/04/16 0522  NA 137 134* 139  K 3.4* 3.3* 2.9*  CL 105 102 104  CO2 '27 27 27  '$ BUN '12 11 14  '$ CREATININE 0.60* 0.53* 0.72  GLUCOSE 161*  142* 170*    Electrolytes  Recent Labs Lab 07/02/16 0500 07/03/16 0517 07/04/16 0522  CALCIUM 6.9* 7.0* 7.5*  MG  --  1.6* 1.7  PHOS  --  1.4* 1.7*    CBC  Recent Labs Lab 07/02/16 0500 07/03/16 0517 07/04/16 0522  WBC 8.4 7.2 7.2  HGB 7.2* 7.5* 8.7*  HCT 21.0* 21.8* 25.6*  PLT 46* 47* 57*    Coag's  Recent Labs Lab 06/28/16 0535 06/30/16 1132  APTT 57*  --   INR  --  1.61    Sepsis Markers  Recent Labs Lab 06/29/16 1507 06/29/16 2125 06/30/16 1033 06/30/16 1132 07/01/16 0317 07/02/16 0500  LATICACIDVEN 2.5* 2.3*  --  2.1*  --   --   PROCALCITON  --   --  0.25  --  0.14 <0.10    ABG No results for input(s): PHART, PCO2ART, PO2ART in the last 168 hours.  Liver Enzymes No results for input(s): AST, ALT, ALKPHOS, BILITOT, ALBUMIN in the last 168 hours.  Cardiac Enzymes No results for input(s): TROPONINI, PROBNP in the last 168 hours.  Glucose  Recent Labs Lab 07/02/16 1643 07/02/16 2122 07/03/16 0735 07/03/16 1115 07/03/16 1559 07/04/16 0811  GLUCAP 115* 153* 121* 169* 113* 155*    Imaging No results found.  STUDIES:  Venous US 12/21>>>Findings consistent with acute deep vein thrombosis involving peroneal, popliteal, and distal femoral arteryof the right lower extremity. - Findings consistent with acute deep vein thrombosis involving  the   popliteal and gastrocnemius veins of the left lower extremity. - Findings consistent with chronic deep vein thrombosis involving   them proximal posterior tibial vein of the right lower extremity. - No evidence of a superficial thrombosis of the right or left   lower extremity. CT abd/pelvis 12/20: Persistently dilated fluid and gas filled small bowel loops to level of the right lower quadrant/ distal ilium with abrupt transition without mass identified and therefore may be related to stricture/adhesion.Interval development of abnormal thickening of the ascending colon and proximal descending colon  with surrounding inflammation of fat planes suggesting colitis whether inflammatory infectious or ischemic.  CULTURES: BCX2 12/15: bacteroides vulgatus BCX2 12/20>>>  ANTIBIOTICS: Merrem 12/20>>> Flagyl 12/21>>>  SIGNIFICANT EVENTS:   LINES/TUBES: Right port date unknown R IJ removed  DISCUSSION: 75 yo w/ NSCLC, recurrent bowel obstruction, PE on NOAC. Admitted w/ recurrent SBO & GNR bacteremia on 12/15. PCCM asked to see on 12/20 when he became progressively hypotensive. CT imaging of abd raising concern for ischemia vs infectious colitis. New acute LE DVT in-spite of NOAC, which we had to stop 12/21 d/t colitis related GIB and hgb drop of almost 2 gms. Now s/p IVC filter. Surgery following and recommends against surgery.   ASSESSMENT / PLAN:  PULMONARY A: Cough  Possible Aspiration  ->CXR w/ some progression of infiltrates suspect he has aspiration event when vomiting. Clinically had cough following the event  P:   O2 as needed, titrate down as able  CARDIOVASCULAR A:  Severe sepsis/septic shock. LA stable.  End-organ fxn remains intact Still pressor dep but weaning  P:  KVO IVFs  Cont stress dose steroids, no taper given pressor requirement. Hold any antihypertensives Increase midodrine 10 mg TID Tele  Levophed for BP support down to 6 mcg  RENAL A:   H/o BPH  Chronic prostatitis  Hypernatremia -->improved w/ free water replacement  Hyperchloremia H/o DI  Hypokalemia   P:   MAP goal >65 Replace K, Mg and Phos D/C free water Strict I&O D/C LR Lasix 20 mg IV q6 x3 doses Renal dose meds . Cont DDAVP F/u chem in am   GASTROINTESTINAL A:   SBO New colitis ? Ischemic vs infectious-->favor ischemia in setting of SBO Projectile emesis (resolved) -->surgical services has seen and also reviewed CT imaging. They recommend against surgery  P:   Diet per surgical services   PPI See ID section   HEMATOLOGIC A:   NSCLC (80% PDL1+)-->treateing w/  [name] H/o PE and DVT on Eliquis-->new DVTs on NOAC 12/21 Possible HIT H/o Multiple Myeloma w/ skull lesion s/p XRT 2016) Blood loss anemia  P:  Trend CBC Stopped NOAC for now. Temp Filter placed 12/21. When he gets to a point where anticoagulation can be resumed he will need heme to weigh in; ideally would be better off on LMWH. There was concern about HIT in past but was a clinical concern not objectively studied.   INFECTIOUS A:   Bacteroides Vulgatus  bacteremia (beta lactamase +)-->suspect transient bacterial translocation  Possible aspiration  Colitis ischemic vs infectious -->favor ischemia  P:   Merrem (PCN allergy).  IM started flagyl. Cdiff pending PCT alogrithm noted See CV section   ENDOCRINE A:   H/o DI P:   Cont DDAVP Stress dose steroids for hypotension, no taper  NEUROLOGIC A:   No acute  P:   RASS goal: 0 PRN analgesia  Percocet 1 tab q4PRN for pain at the port  FAMILY  -  Updates: Patient updated bedside - Inter-disciplinary family meet or Palliative Care meeting due by:  07/06/16  The patient is critically ill with multiple organ systems failure and requires high complexity decision making for assessment and support, frequent evaluation and titration of therapies, application of advanced monitoring technologies and extensive interpretation of multiple databases.   Critical Care Time devoted to patient care services described in this note is  35  Minutes. This time reflects time of care of this signee Dr Jennet Maduro. This critical care time does not reflect procedure time, or teaching time or supervisory time of PA/NP/Med student/Med Resident etc but could involve care discussion time.  Rush Farmer, M.D. [facility] Pulmonary/Critical Care Medicine. Pager: [phone]. After hours pager: [phone].  07/04/2016, 10:37 AM

## 2016-07-04 NOTE — Progress Notes (Signed)
  Subjective: Looks good.  Wants to go home. No complaints or abdominal pain. Having bm's.  Objective: Vital signs in last 24 hours: Temp:  [97.4 F (36.3 C)-98 F (36.7 C)] 97.8 F (36.6 C) (12/25 0400) Pulse Rate:  [62-89] 69 (12/25 0630) Resp:  [6-22] 19 (12/25 0630) BP: (70-129)/(46-84) 115/80 (12/25 0630) SpO2:  [90 %-99 %] 99 % (12/25 0630) Weight:  [92.8 kg (204 lb 9.4 oz)] 92.8 kg (204 lb 9.4 oz) (12/25 0500) Last BM Date: 07/03/16  Intake/Output from previous day: 12/24 0701 - 12/25 0700 In: 1121.7 [I.V.:721.7; IV Piggyback:400] Out: 3060 [Urine:3060] Intake/Output this shift: No intake/output data recorded.  Resp: clear to auscultation bilaterally Cardio: regular rate and rhythm GI: Soft, nontender. Has good bowel sounds.  Lab Results:   Recent Labs  07/03/16 0517 07/04/16 0522  WBC 7.2 7.2  HGB 7.5* 8.7*  HCT 21.8* 25.6*  PLT 47* 57*   BMET  Recent Labs  07/03/16 0517 07/04/16 0522  NA 134* 139  K 3.3* 2.9*  CL 102 104  CO2 27 27  GLUCOSE 142* 170*  BUN 11 14  CREATININE 0.53* 0.72  CALCIUM 7.0* 7.5*   PT/INR No results for input(s): LABPROT, INR in the last 72 hours. ABG No results for input(s): PHART, HCO3 in the last 72 hours.  Invalid input(s): PCO2, PO2  Studies/Results: No results found.  Anti-infectives: Anti-infectives    Start     Dose/Rate Route Frequency Ordered Stop   06/30/16 0830  metroNIDAZOLE (FLAGYL) IVPB 500 mg     500 mg 100 mL/hr over 60 Minutes Intravenous Every 8 hours 06/30/16 0735     06/29/16 1800  meropenem (MERREM) 1 g in sodium chloride 0.9 % 100 mL IVPB     1 g 200 mL/hr over 30 Minutes Intravenous Every 8 hours 06/29/16 1552     06/29/16 1430  levofloxacin (LEVAQUIN) IVPB 750 mg  Status:  Discontinued     750 mg 100 mL/hr over 90 Minutes Intravenous Every 24 hours 06/29/16 1355 06/29/16 1543   06/25/16 1000  levofloxacin (LEVAQUIN) tablet 500 mg  Status:  Discontinued     500 mg Oral Daily 06/24/16  2026 06/25/16 0941      Assessment/Plan: s/p * No surgery found *   1.  Colitis (on CT scan 06/29/2016)  No sign of obstruction  On Flagyl/Merrm for colitis  WBC - 7,200 - 07/04/2016  Still pressor dependent. Per CCM Advance diet   No acute surgical issue.  Taking reg diet.  Will sign off. 2.  Anemia  Hgb - 8.7 - 07/04/2016 3.  IVC filter - 06/30/2016  For DVT of right LE 4.  Metastatic non small cell lung cancer (adeno) with mets to brain   LOS: 10 days    Joann Jorge H 07/04/2016

## 2016-07-05 DIAGNOSIS — C3490 Malignant neoplasm of unspecified part of unspecified bronchus or lung: Secondary | ICD-10-CM

## 2016-07-05 DIAGNOSIS — R531 Weakness: Secondary | ICD-10-CM

## 2016-07-05 DIAGNOSIS — C7931 Secondary malignant neoplasm of brain: Secondary | ICD-10-CM

## 2016-07-05 LAB — CBC
HCT: 23.9 % — ABNORMAL LOW (ref 39.0–52.0)
HEMOGLOBIN: 8.2 g/dL — AB (ref 13.0–17.0)
MCH: 32.8 pg (ref 26.0–34.0)
MCHC: 34.3 g/dL (ref 30.0–36.0)
MCV: 95.6 fL (ref 78.0–100.0)
PLATELETS: 44 10*3/uL — AB (ref 150–400)
RBC: 2.5 MIL/uL — ABNORMAL LOW (ref 4.22–5.81)
RDW: 18.3 % — AB (ref 11.5–15.5)
WBC: 4.5 10*3/uL (ref 4.0–10.5)

## 2016-07-05 LAB — BASIC METABOLIC PANEL
Anion gap: 7 (ref 5–15)
BUN: 12 mg/dL (ref 6–20)
CALCIUM: 7.1 mg/dL — AB (ref 8.9–10.3)
CHLORIDE: 100 mmol/L — AB (ref 101–111)
CO2: 30 mmol/L (ref 22–32)
CREATININE: 0.61 mg/dL (ref 0.61–1.24)
Glucose, Bld: 123 mg/dL — ABNORMAL HIGH (ref 65–99)
Potassium: 3.1 mmol/L — ABNORMAL LOW (ref 3.5–5.1)
SODIUM: 137 mmol/L (ref 135–145)

## 2016-07-05 LAB — GLUCOSE, CAPILLARY
GLUCOSE-CAPILLARY: 124 mg/dL — AB (ref 65–99)
Glucose-Capillary: 202 mg/dL — ABNORMAL HIGH (ref 65–99)
Glucose-Capillary: 80 mg/dL (ref 65–99)
Glucose-Capillary: 117 mg/dL — ABNORMAL HIGH (ref 65–99)

## 2016-07-05 LAB — PROCALCITONIN: Procalcitonin: <0.1 ng/mL

## 2016-07-05 LAB — MAGNESIUM: MAGNESIUM: 1.9 mg/dL (ref 1.7–2.4)

## 2016-07-05 LAB — PHOSPHORUS: PHOSPHORUS: 2.5 mg/dL (ref 2.5–4.6)

## 2016-07-05 MED ORDER — MIDODRINE HCL 5 MG PO TABS
15.0000 mg | ORAL_TABLET | Freq: Three times a day (TID) | ORAL | Status: DC
Start: 2016-07-05 — End: 2016-07-13
  Administered 2016-07-05 – 2016-07-12 (×22): 15 mg via ORAL
  Filled 2016-07-05 (×27): qty 3

## 2016-07-05 MED ORDER — POTASSIUM CHLORIDE CRYS ER 20 MEQ PO TBCR
40.0000 meq | EXTENDED_RELEASE_TABLET | Freq: Two times a day (BID) | ORAL | Status: DC
  Administered 2016-07-05: 40 meq via ORAL
  Filled 2016-07-05 (×2): qty 2

## 2016-07-05 MED ORDER — POTASSIUM CHLORIDE 20 MEQ/15ML (10%) PO SOLN
40.0000 meq | Freq: Two times a day (BID) | ORAL | Status: AC
  Administered 2016-07-05: 40 meq via ORAL
  Filled 2016-07-05: qty 30

## 2016-07-05 MED ORDER — POTASSIUM CHLORIDE 20 MEQ/15ML (10%) PO SOLN: 40.0000 meq | Freq: Two times a day (BID) | ORAL | Status: DC

## 2016-07-05 NOTE — Progress Notes (Signed)
Subjective: The patient is seen and examined today. He is feeling much better and recovering well from the recent sepsis. He continues to have hypotension. He is currently off pressors. He is expected to be transferred to the floor in the next day or two. He denied having any chest pain, shortness of breath, cough or hemoptysis. He has no fever or chills.  Objective: Vital signs in last 24 hours: Temp:  [97.8 F (36.6 C)-98.4 F (36.9 C)] 97.8 F (36.6 C) (12/26 0800) Pulse Rate:  [61-106] 70 (12/26 1400) Resp:  [12-33] 16 (12/26 1400) BP: (75-120)/(28-72) 75/28 (12/26 1400) SpO2:  [94 %-99 %] 98 % (12/26 1400) Weight:  [201 lb 11.5 oz (91.5 kg)] 201 lb 11.5 oz (91.5 kg) (12/26 0500)  Intake/Output from previous day: 12/25 0701 - 12/26 0700 In: 2177.2 [P.O.:480; I.V.:597.2; IV Piggyback:1100] Out: 4910 [Urine:4910] Intake/Output this shift: Total I/O In: 267.6 [I.V.:167.6; IV Piggyback:100] Out: -   General appearance: alert, cooperative, fatigued and mild distress Resp: clear to auscultation bilaterally Cardio: regular rate and rhythm, S1, S2 normal, no murmur, click, rub or gallop GI: soft, non-tender; bowel sounds normal; no masses,  no organomegaly Extremities: extremities normal, atraumatic, no cyanosis or edema  Lab Results:   Recent Labs  07/04/16 0522 07/05/16 0534  WBC 7.2 4.5  HGB 8.7* 8.2*  HCT 25.6* 23.9*  PLT 57* 44*   BMET  Recent Labs  07/04/16 0522 07/05/16 0534  NA 139 137  K 2.9* 3.1*  CL 104 100*  CO2 27 30  GLUCOSE 170* 123*  BUN 14 12  CREATININE 0.72 0.61  CALCIUM 7.5* 7.1*    Studies/Results: No results found.  Medications: I have reviewed the patient's current medications.  Assessment/Plan: 1) metastatic non-small cell lung cancer with multiple brain metastasis status post stereotactic radiotherapy. He was also started on treatment with immunotherapy with Ketruda (pembrolizumab) status post 1 cycle. His treatment has been on hold  for the last few weeks secondary to his multiple admissions with small bowel obstruction, pulmonary embolism as well as recent sepsis. We'll continue to hold his treatment for now until improvement of his condition. 2) gram-negative rod bacteremia: (Bacteroids Vulgatus) Managed by pulmonary and critical care. Currently on merrem. 3) recurrent pulmonary embolism: Status post IVC filter placement currently off anticoagulation. I would recommend resuming Eliquis if there is no other contraindication for anticoagulation. 4) history of multiple myeloma: Status post several chemotherapy regimens and peripheral blood autologous stem cell transplant. Currently on observation. 5) history of small bowel obstruction: Much better. Followed by surgery. 6) generalized weakness and fatigue: May benefit from PT/OT and rehabilitation after discharge from the hospital. Thank you for taking good care of Mr. Lovena Le, I will continue to follow up the patient with you and assist in his management on as-needed basis.    LOS: 11 days    Wynetta Seith K. 07/05/2016

## 2016-07-05 NOTE — Progress Notes (Signed)
Physical Therapy Treatment Patient Details Name: [name] MRN: [id number] DOB: Jul 25, 1940 Today's Date: 07/05/2016    History of Present Illness [name] is a 75 y.o. male with medical history significant of recently diagnosed stage IV NSCLC (80% PDL1+) being treated with pembrolizomab [name], immunotherapy), recent SBO (thought to be due to adhesions), DVT, multifocal PE (with possible HIT) on Eliquis, Gerd, multiple myeloma with skull lesion (s/p of radiation therapy 2016), chronic prostatitis, BPH, dCHF, diabetes insipidus on DDAVP, who presents with shortness of breath abdominal pain.    PT Comments    Then patient complained of feeling tingling in the fingers and dizzy and blurred vision . BO taken was 73/32. RN aware, palced patient in reverse Trendelenburg. Unable to mobilize this visit. Continue PT while in acute care.   Follow Up Recommendations  CIR     Equipment Recommendations  None recommended by PT    Recommendations for Other Services       Precautions / Restrictions Precautions Precautions: Fall Precaution Comments: monitor BP, hypotensive    Mobility  Bed Mobility Overal bed mobility: Needs Assistance             General bed mobility comments: p73/42 and complains of dizziness in bed and feels numbness in the hands.repositionred in bed in Trendelenburg  due to [place] transfer comment: not attempted due to BP  Ambulation/Gait                 Stairs            Wheelchair Mobility    Modified Rankin (Stroke Patients Only)       Balance                                    Cognition Arousal/Alertness: Awake/alert Behavior During Therapy: Anxious                        Exercises      General Comments        Pertinent Vitals/Pain Pain Assessment: No/denies pain    Home Living                      Prior Function            PT  Goals (current goals can now be found in the care plan section) Progress towards PT goals: Not Progressing toward goals due to hypotension    Frequency    Min 3X/week      PT Plan Current plan remains appropriate    Co-evaluation             End of Session   Activity Tolerance: Treatment limited secondary to medical complications (Comment) (low BP. on meds to support.) Patient left: in bed;with bed alarm set     Time: 0826-0850 PT Time Calculation (min) (ACUTE ONLY): 24 min  Charges:  $Therapeutic Activity: 23-37 mins                    G Codes:      [name] 07/05/2016, 1:13 PM [name] PT [phone]

## 2016-07-05 NOTE — Progress Notes (Addendum)
PULMONARY / CRITICAL CARE MEDICINE   Name: [name] MRN: [id number] DOB: May 01, 1941    ADMISSION DATE:  06/24/2016 CONSULTATION DATE:  06/29/16  REFERRING MD:  Katheran James, MD  CHIEF COMPLAINT:  Sepsis, gram-negative rod bacteremia  HISTORY OF PRESENT ILLNESS:   75 year old with stage IV adenocarcinoma, arthritis, BPH, DVT. He has diagnosis of stage IVB (T1b, N2, M1c) non-small cell lung cancer, adenocarcinoma with metastases to the brain diagnosed in October 2017. He was undergoing chemotherapy with Prembrolizomab and recent dx of recurrent SBO, PE on eliquis who was admitted on 12/15 with severe abdominal pain. He was assessed by surgery who recommended conservative management with IVF and NG decompression.  On 06/29/16 he developed nausea and projectile vomiting, decreased blood pressure. Noted to have gram-negative rod bacteremia. He was transferred to stepdown for further management and PCCM consulted.  SUBJECTIVE:  No events overnight. Still on low dose levophed. No evidence of end organ perfusion problems with normal LA and renal function.   VITAL SIGNS: BP 97/65   Pulse 76   Temp 97.8 F (36.6 C) (Oral)   Resp 16   Ht '5\' 10"'$  (1.778 m)   Wt 91.5 kg (201 lb 11.5 oz)   SpO2 96%   BMI 28.94 kg/m   HEMODYNAMICS:    VENTILATOR SETTINGS:   INTAKE / OUTPUT: I/O last 3 completed shifts: In: 2683.2 [P.O.:480; I.V.:903.2; IV Piggyback:1300] Out: 5093 [OIZTI:4580]  PHYSICAL EXAMINATION: General:  Awake, alert, oriented  Neuro:  No focal deficits. Oriented X 4 HEENT:  Dry mucous membranes, no thyromegaly, JVD Cardiovascular:  Regular rate and rhythm, no MRG; occ PACs Lungs:  Clear, no wheeze, crackles, no accessory use  Abdomen:  Distended, absent bowel sounds Musculoskeletal:  Normal tone and bulk  LABS:  BMET  Recent Labs Lab 07/03/16 0517 07/04/16 0522 07/05/16 0534  NA 134* 139 137  K 3.3* 2.9* 3.1*  CL 102 104 100*  CO2 '27 27 30  '$ BUN '11 14 12   '$ CREATININE 0.53* 0.72 0.61  GLUCOSE 142* 170* 123*    Electrolytes  Recent Labs Lab 07/03/16 0517 07/04/16 0522 07/05/16 0534  CALCIUM 7.0* 7.5* 7.1*  MG 1.6* 1.7 1.9  PHOS 1.4* 1.7* 2.5    CBC  Recent Labs Lab 07/03/16 0517 07/04/16 0522 07/05/16 0534  WBC 7.2 7.2 4.5  HGB 7.5* 8.7* 8.2*  HCT 21.8* 25.6* 23.9*  PLT 47* 57* 44*    Coag's  Recent Labs Lab 06/30/16 1132  INR 1.61    Sepsis Markers  Recent Labs Lab 06/29/16 1507 06/29/16 2125 06/30/16 1033 06/30/16 1132 07/01/16 0317 07/02/16 0500  LATICACIDVEN 2.5* 2.3*  --  2.1*  --   --   PROCALCITON  --   --  0.25  --  0.14 <0.10    ABG No results for input(s): PHART, PCO2ART, PO2ART in the last 168 hours.  Liver Enzymes No results for input(s): AST, ALT, ALKPHOS, BILITOT, ALBUMIN in the last 168 hours.  Cardiac Enzymes No results for input(s): TROPONINI, PROBNP in the last 168 hours.  Glucose  Recent Labs Lab 07/04/16 0811 07/04/16 1132 07/04/16 1619 07/04/16 1936 07/04/16 2255 07/05/16 0852  GLUCAP 155* 159* 138* 140* 116* 202*    Imaging No results found.  STUDIES:  Venous US 12/21>>>Findings consistent with acute deep vein thrombosis involving peroneal, popliteal, and distal femoral arteryof the right lower extremity. - Findings consistent with acute deep vein thrombosis involving the   popliteal and gastrocnemius veins of the left lower extremity. -  Findings consistent with chronic deep vein thrombosis involving   them proximal posterior tibial vein of the right lower extremity. - No evidence of a superficial thrombosis of the right or left   lower extremity. CT abd/pelvis 12/20: Persistently dilated fluid and gas filled small bowel loops to level of the right lower quadrant/ distal ilium with abrupt transition without mass identified and therefore may be related to stricture/adhesion.Interval development of abnormal thickening of the ascending colon and proximal descending  colon with surrounding inflammation of fat planes suggesting colitis whether inflammatory infectious or ischemic.  CULTURES: BCX2 12/15: bacteroides vulgatus BCX2 12/20>>>  ANTIBIOTICS: Merrem 12/20>>> Flagyl 12/21>> 12/26  SIGNIFICANT EVENTS:   LINES/TUBES: Right port date unknown R IJ removed  DISCUSSION: 75 yo w/ NSCLC, recurrent bowel obstruction, PE on NOAC. Admitted w/ recurrent SBO & GNR bacteremia on 12/15. PCCM asked to see on 12/20 when he became progressively hypotensive. CT imaging of abd raising concern for ischemia vs infectious colitis. New acute LE DVT in-spite of NOAC, which we had to stop 12/21 d/t colitis related GIB and hgb drop of almost 2 gms. Now s/p IVC filter. Surgery following and recommends against surgery.   ASSESSMENT / PLAN:  PULMONARY A: Cough  Possible Aspiration  ->CXR w/ some progression of infiltrates suspect he has aspiration event when vomiting. Clinically had cough following the event  P:   O2 as needed, titrate down as able. Repeat CXR  CARDIOVASCULAR A:  Severe sepsis/septic shock. LA stable.  End-organ fxn remains intact Still pressor dep but weaning  P:  KVO IVFs  Cont stress dose steroids, no taper given pressor requirement. Hold any antihypertensives Increased midodrine 15 mg TID D/C flomax as he has dizziness today Levophed for BP support down to 6 mcg. Change MAP goal to 60.   RENAL A:   H/o BPH  Chronic prostatitis  Hypernatremia -->improved w/ free water replacement  Hyperchloremia H/o DI  Hypokalemia   P:   Replace K D/C free water Strict I&O D/C LR Lasix 20 mg IV q6 x3 doses. Hold further doses while we attempt to get off pressors Renal dose meds . Cont DDAVP F/u chem in am   GASTROINTESTINAL A:   SBO New colitis ? Ischemic vs infectious-->favor ischemia in setting of SBO Projectile emesis (resolved) -->surgical services has seen and also reviewed CT imaging. They recommend against surgery  P:    Diet per surgical services   PPI See ID section   HEMATOLOGIC A:   NSCLC (80% PDL1+)-->treateing w/ [name] H/o PE and DVT on Eliquis-->new DVTs on NOAC 12/21 Possible HIT H/o Multiple Myeloma w/ skull lesion s/p XRT 2016) Blood loss anemia  P:  Trend CBC Stopped NOAC for now. Temp Filter placed 12/21. When he gets to a point where anticoagulation can be resumed he will need heme to weigh in; ideally would be better off on LMWH. There was concern about HIT in past but was a clinical concern not objectively studied.   INFECTIOUS A:   Bacteroides Vulgatus  bacteremia (beta lactamase +)-->suspect transient bacterial translocation  Possible aspiration  Colitis ischemic vs infectious -->favor ischemia  P:   Merrem (PCN allergy).  On flagyl for presumed C diff but no diarrhea. Will D/C. PCT alogrithm noted See CV section   ENDOCRINE A:   H/o DI P:   Cont DDAVP Stress dose steroids for hypotension, no taper  NEUROLOGIC A:   No acute  P:   RASS goal: 0 PRN analgesia  Percocet 1  tab q4PRN for pain at the port  FAMILY  - Updates: Patient updated bedside 12/26. - Inter-disciplinary family meet or Palliative Care meeting due by:  07/06/16  Critical care time- 35 mins.  Marshell Garfinkel MD  Pulmonary and Critical Care Pager [phone] If no answer or after 3pm call: [phone] 07/05/2016, 10:21 AM

## 2016-07-06 ENCOUNTER — Inpatient Hospital Stay (HOSPITAL_COMMUNITY): Payer: Medicare Other

## 2016-07-06 LAB — GLUCOSE, CAPILLARY
GLUCOSE-CAPILLARY: 103 mg/dL — AB (ref 65–99)
Glucose-Capillary: 110 mg/dL — ABNORMAL HIGH (ref 65–99)
Glucose-Capillary: 119 mg/dL — ABNORMAL HIGH (ref 65–99)
Glucose-Capillary: 99 mg/dL (ref 65–99)

## 2016-07-06 LAB — CBC
HEMATOCRIT: 25.5 % — AB (ref 39.0–52.0)
HEMOGLOBIN: 8.4 g/dL — AB (ref 13.0–17.0)
MCH: 31.3 pg (ref 26.0–34.0)
MCHC: 32.9 g/dL (ref 30.0–36.0)
MCV: 95.1 fL (ref 78.0–100.0)
Platelets: 47 10*3/uL — ABNORMAL LOW (ref 150–400)
RBC: 2.68 MIL/uL — AB (ref 4.22–5.81)
RDW: 18.7 % — ABNORMAL HIGH (ref 11.5–15.5)
WBC: 6.3 10*3/uL (ref 4.0–10.5)

## 2016-07-06 LAB — BASIC METABOLIC PANEL WITH GFR
Anion gap: 5 (ref 5–15)
BUN: 15 mg/dL (ref 6–20)
CO2: 29 mmol/L (ref 22–32)
Calcium: 7.4 mg/dL — ABNORMAL LOW (ref 8.9–10.3)
Chloride: 99 mmol/L — ABNORMAL LOW (ref 101–111)
Creatinine, Ser: 0.67 mg/dL (ref 0.61–1.24)
GFR calc Af Amer: >60 mL/min
GFR calc non Af Amer: >60 mL/min
Glucose, Bld: 100 mg/dL — ABNORMAL HIGH (ref 65–99)
Potassium: 4 mmol/L (ref 3.5–5.1)
Sodium: 133 mmol/L — ABNORMAL LOW (ref 135–145)

## 2016-07-06 LAB — LACTIC ACID, PLASMA: Lactic Acid, Venous: 1.9 mmol/L (ref 0.5–1.9)

## 2016-07-06 LAB — PHOSPHORUS: PHOSPHORUS: 2.5 mg/dL (ref 2.5–4.6)

## 2016-07-06 LAB — MAGNESIUM: Magnesium: 1.8 mg/dL (ref 1.7–2.4)

## 2016-07-06 LAB — PROCALCITONIN: Procalcitonin: <0.1 ng/mL

## 2016-07-06 MED ORDER — SALINE SPRAY 0.65 % NA SOLN
1.0000 | NASAL | Status: DC | PRN
  Administered 2016-07-07: 1 via NASAL
  Filled 2016-07-06 (×2): qty 44

## 2016-07-06 MED ORDER — SODIUM CHLORIDE 0.9 % IV BOLUS (SEPSIS)
500.0000 mL | Freq: Once | INTRAVENOUS | Status: AC
  Administered 2016-07-06: 500 mL via INTRAVENOUS

## 2016-07-06 MED ORDER — RIVAROXABAN 20 MG PO TABS
20.0000 mg | ORAL_TABLET | Freq: Every day | ORAL | Status: DC
  Administered 2016-07-06 – 2016-07-08 (×3): 20 mg via ORAL
  Filled 2016-07-06 (×4): qty 1

## 2016-07-06 NOTE — Progress Notes (Signed)
[place] Progress Note Patient Name: [name] DOB: Nov 29, 1940 MRN: [id number]   Date of Service  07/06/2016  HPI/Events of Note  Request for saline nasal spray.   eICU Interventions  Will order saline nasal spray PRN.      Intervention Category Intermediate Interventions: Other:  Lysle Dingwall 07/06/2016, 10:07 PM

## 2016-07-06 NOTE — Progress Notes (Signed)
Attempted to insert NG 2x, unable to insert due to congestion. Requested nasal spray, will try and attempt once that has set in.

## 2016-07-06 NOTE — Progress Notes (Signed)
[place] for Xarelto Indication: Hx DVT/PE, acute PE with suspected HIT, acute DVT  Allergies  Allergen Reactions  . Tramadol Palpitations    "Made my chest feel tight"  . Amoxicillin     REACTION: rash  . Penicillins Rash    Has patient had a PCN reaction causing immediate rash, facial/tongue/throat swelling, SOB or lightheadedness with hypotension: unknown Has patient had a PCN reaction causing severe rash involving mucus membranes or skin necrosis: unknown Has patient had a PCN reaction that required hospitalization: no  Has patient had a PCN reaction occurring within the last 10 years: no  If all of the above answers are "NO", then may proceed with Cephalosporin use.     Patient Measurements: Height: 5' 10" (177.8 cm) Weight: 201 lb 11.5 oz (91.5 kg) IBW/kg (Calculated) : 73   Vital Signs: Temp: 98.2 F (36.8 C) (12/27 0409) Temp Source: Axillary (12/27 0409) BP: 91/70 (12/27 0800) Pulse Rate: 80 (12/27 0800)  Labs:  Recent Labs  07/04/16 0522 07/05/16 0534 07/06/16 0500  HGB 8.7* 8.2* 8.4*  HCT 25.6* 23.9* 25.5*  PLT 57* 44* 47*  CREATININE 0.72 0.61 0.67    Estimated Creatinine Clearance: 90.7 mL/min (by C-G formula based on SCr of 0.67 mg/dL).   Medical History: Past Medical History:  Diagnosis Date  . Adenocarcinoma of right lung, stage 4 ([place]) 05/27/2016  . Adenomatous polyp of colon 1996  . Arthritis    Osteoarthritis right knee, spine, wrist  . Arthritis of hand, right    Right thumb (Dr. Daylene Katayama)  . Back ache    r/o Multiple Myeloma  . Benign prostatic hypertrophy    (Dr. Karsten Ro in past)  . Degenerative disc disease   . Diverticulosis   . DVT (deep venous thrombosis) ([place]) 01/2014   R calf  . Encounter for antineoplastic chemotherapy 10/20/2015  . Erosive esophagitis   . GERD (gastroesophageal reflux disease)   . Hemorrhoids   . Hiatal hernia   . History of chronic prostatitis   . History of  melanoma    Back (Dr. Wilhemina Bonito); early melanoma R arm 03/2011  . Multiple myeloma ([place]) 2013   skull lytic lesions--treated with radiation 07/2014  . Pulmonary embolism ([place]) 01/2014  . Radiation 07/21/14-08/05/14   left occipital parietal skull 25 gray  . SCC (squamous cell carcinoma) 08/04/15   right ear;dysplastic nevus with severe atypia-chest    Medications:  Scheduled:  . cholecalciferol  1,000 Units Oral QHS  . desmopressin  0.1 mg Oral QHS  . hydrocortisone sod succinate (SOLU-CORTEF) inj  100 mg Intravenous Q8H  . insulin aspart  0-15 Units Subcutaneous TID WC  . mouth rinse  15 mL Mouth Rinse BID  . meropenem (MERREM) IV  1 g Intravenous Q8H  . midodrine  15 mg Oral TID WC  . pantoprazole  40 mg Oral Q1200  . sodium chloride  500 mL Intravenous Once  . sodium chloride flush  10-40 mL Intracatheter Q12H  . sodium chloride flush  3 mL Intravenous Q12H   Infusions:  . norepinephrine (LEVOPHED) Adult infusion 4 mcg/min (07/06/16 0800)    Assessment: 9 yoM admitted on 12/15 on apixaban with new PE.  Pharmacy was initially consulted to dose argatroban IV due to concern for HIT. PMH is significant for metastatic lung cancer and chronic anticoagulation for history of PE/DVT.   Significant events: 12/15 Started argatroban IV 12/19 Transitioned from argatroban to Apixaban 12/21 Apixaban held d/t bloody BM  and ABLA.  Dopplers showed acute bilateral DVT.  IR placed a right IJ retrievable IVC filter. 12/27 Pharmacy is consulted to resume anticoagulation with rivaroxaban (instead of apixaban d/t insurance coverage)   Today, 07/06/2016: SCr 0.67, CrCl ~ 90 ml/min Hgb 8.4 and Plt 47 remain low/stable  Goal of Therapy:  Monitor platelets by anticoagulation protocol: Yes   Plan:  Xarelto 20 mg PO once daily Continue to monitor renal function, CBC  Christine Shade PharmD, BCPS Pager [phone] 07/06/2016 9:12 AM   

## 2016-07-06 NOTE — Progress Notes (Signed)
[place] Progress Note Patient Name: [name] DOB: Oct 23, 1940 MRN: [id number]   Date of Service  07/06/2016  HPI/Events of Note  Review of abdominal film >> Moderately dilated small bowel loops are noted concerning for distal small bowel obstruction. Follow-up radiographs recommended.  eICU Interventions  Will order: 1. NPO. 2. NGT to LIS.     Intervention Category Intermediate Interventions: Abdominal pain - evaluation and management  Sommer,Steven Eugene 07/06/2016, 6:13 PM

## 2016-07-06 NOTE — Progress Notes (Signed)
PULMONARY / CRITICAL CARE MEDICINE   Name: [name] MRN: [id number] DOB: May 05, 1941    ADMISSION DATE:  06/24/2016 CONSULTATION DATE:  06/29/16  REFERRING MD:  Katheran James, MD  CHIEF COMPLAINT:  Sepsis, gram-negative rod bacteremia  HISTORY OF PRESENT ILLNESS:   75 year old with stage IV adenocarcinoma, arthritis, BPH, DVT. He has diagnosis of stage IVB (T1b, N2, M1c) non-small cell lung cancer, adenocarcinoma with metastases to the brain diagnosed in October 2017. He was undergoing chemotherapy with Prembrolizomab and recent dx of recurrent SBO, PE on eliquis who was admitted on 12/15 with severe abdominal pain. He was assessed by surgery who recommended conservative management with IVF and NG decompression.  On 06/29/16 he developed nausea and projectile vomiting, decreased blood pressure. Noted to have gram-negative rod bacteremia. He was transferred to stepdown for further management and PCCM consulted.  SUBJECTIVE:  No events overnight. Back on low dose levophed.   VITAL SIGNS: BP (!) 78/52   Pulse 87   Temp 98.2 F (36.8 C) (Axillary)   Resp 18   Ht '5\' 10"'$  (1.778 m)   Wt 201 lb 11.5 oz (91.5 kg)   SpO2 93%   BMI 28.94 kg/m   HEMODYNAMICS:    VENTILATOR SETTINGS:   INTAKE / OUTPUT: I/O last 3 completed shifts: In: 1590.7 [P.O.:480; I.V.:510.7; IV Piggyback:600] Out: 7425 [Urine:3035]  PHYSICAL EXAMINATION: General:  Awake, alert, oriented  Neuro:  No focal deficits. Oriented X 4 HEENT:  Dry mucous membranes, no thyromegaly, JVD Cardiovascular:  Regular rate and rhythm, no MRG; occ PACs Lungs:  Clear, no wheeze, crackles, no accessory use  Abdomen:  Distended, absent bowel sounds Musculoskeletal:  Normal tone and bulk, 1-2 LE edema  LABS:  BMET  Recent Labs Lab 07/04/16 0522 07/05/16 0534 07/06/16 0500  NA 139 137 133*  K 2.9* 3.1* 4.0  CL 104 100* 99*  CO2 '27 30 29  '$ BUN '14 12 15  '$ CREATININE 0.72 0.61 0.67  GLUCOSE 170* 123* 100*     Electrolytes  Recent Labs Lab 07/04/16 0522 07/05/16 0534 07/06/16 0500  CALCIUM 7.5* 7.1* 7.4*  MG 1.7 1.9 1.8  PHOS 1.7* 2.5 2.5    CBC  Recent Labs Lab 07/04/16 0522 07/05/16 0534 07/06/16 0500  WBC 7.2 4.5 6.3  HGB 8.7* 8.2* 8.4*  HCT 25.6* 23.9* 25.5*  PLT 57* 44* 47*    Coag's  Recent Labs Lab 06/30/16 1132  INR 1.61    Sepsis Markers  Recent Labs Lab 06/29/16 1507 06/29/16 2125  06/30/16 1132  07/02/16 0500 07/05/16 0534 07/06/16 0500  LATICACIDVEN 2.5* 2.3*  --  2.1*  --   --   --   --   PROCALCITON  --   --   < >  --   < > <0.10 <0.10 <0.10  < > = values in this interval not displayed.  ABG No results for input(s): PHART, PCO2ART, PO2ART in the last 168 hours.  Liver Enzymes No results for input(s): AST, ALT, ALKPHOS, BILITOT, ALBUMIN in the last 168 hours.  Cardiac Enzymes No results for input(s): TROPONINI, PROBNP in the last 168 hours.  Glucose  Recent Labs Lab 07/04/16 1936 07/04/16 2255 07/05/16 0852 07/05/16 1232 07/05/16 1541 07/05/16 2158  GLUCAP 140* 116* 202* 124* 80 117*    Imaging Dg Chest Port 1 View  Result Date: 07/06/2016 CLINICAL DATA:  Respiratory failure. EXAM: PORTABLE CHEST 1 VIEW COMPARISON:  07/01/2016. FINDINGS: PowerPort catheter noted with tip projected over right atrium in stable  position. Stable cardiomegaly. Partial clearing of bilateral upper lobe infiltrates. Progression of right base atelectasis and infiltrate. Small right pleural effusion. No pneumothorax. Questionable bowel distention noted. No free air noted. IMPRESSION: 1. PowerPort catheter noted in stable position . 2. Partial clearing of bilateral upper lobe infiltrates. Progression of right base atelectasis and infiltrate. Small right pleural effusion. 3. Questionable bowel distention. Abdominal series can be obtained if needed. Electronically Signed   By: Marcello Moores  Register   On: 07/06/2016 06:45    STUDIES:  Venous US 12/21>>>Findings  consistent with acute deep vein thrombosis involving peroneal, popliteal, and distal femoral arteryof the right lower extremity. - Findings consistent with acute deep vein thrombosis involving the   popliteal and gastrocnemius veins of the left lower extremity. - Findings consistent with chronic deep vein thrombosis involving   them proximal posterior tibial vein of the right lower extremity. - No evidence of a superficial thrombosis of the right or left   lower extremity. CT abd/pelvis 12/20: Persistently dilated fluid and gas filled small bowel loops to level of the right lower quadrant/ distal ilium with abrupt transition without mass identified and therefore may be related to stricture/adhesion.Interval development of abnormal thickening of the ascending colon and proximal descending colon with surrounding inflammation of fat planes suggesting colitis whether inflammatory infectious or ischemic.  CULTURES: BCX2 12/15: bacteroides vulgatus BCX2 12/20>>>  ANTIBIOTICS: Merrem 12/20>>> 12/27 Flagyl 12/21>> 12/26  SIGNIFICANT EVENTS:  LINES/TUBES: Right port date unknown R IJ removed  DISCUSSION: 75 yo w/ NSCLC, recurrent bowel obstruction, PE on NOAC. Admitted w/ recurrent SBO & GNR bacteremia on 12/15. PCCM asked to see on 12/20 when he became progressively hypotensive. CT imaging of abd raising concern for ischemia vs infectious colitis. New acute LE DVT in-spite of NOAC, which we had to stop 12/21 d/t colitis related GIB and hgb drop of almost 2 gms. Now s/p IVC filter. Surgery following and recommends against surgery.   ASSESSMENT / PLAN:  PULMONARY A: Cough  Possible Aspiration  ->CXR w/ some progression of infiltrates suspect he has aspiration event when vomiting. Clinically had cough following the event  P:   O2 as needed, titrate down as able. Follow CXR  CARDIOVASCULAR A:  Severe sepsis/septic shock. LA stable.  End-organ fxn remains intact Still pressor dep but  weaning  P:  Will give a small fluid bolus to get off levophed. Cont stress dose steroids, no taper given pressor requirement. Hold any antihypertensives Increased midodrine 15 mg TID D/C flomax as he has dizziness. Levophed for BP support down to 2 mcg. Change MAP goal to 60.   RENAL A:   H/o BPH  Chronic prostatitis  Hypernatremia -->improved w/ free water replacement  Hyperchloremia H/o DI  Hypokalemia   P:   Strict I&O Holding further lasix doses (last dose was on 12/25) Renal dose meds . Cont DDAVP F/u chem in am   GASTROINTESTINAL A:   SBO New colitis ? Ischemic vs infectious-->favor ischemia in setting of SBO Projectile emesis (resolved) -->surgical services has seen and also reviewed CT imaging. They recommend against surgery  P:   Diet per surgical services   PPI See ID section   HEMATOLOGIC A:   NSCLC (80% PDL1+)-->treateing w/ [name] H/o PE and DVT on Eliquis-->new DVTs on NOAC 12/21 Possible HIT H/o Multiple Myeloma w/ skull lesion s/p XRT 2016) Blood loss anemia  P:  Trend CBC Temp Filter placed 12/21.Restart NOAC. Will use xarelto as eliquis was apparently denied by insurance.  INFECTIOUS A:   Bacteroides Vulgatus  bacteremia (beta lactamase +)-->suspect transient bacterial translocation  Possible aspiration  Colitis ischemic vs infectious -->favor ischemia  P:   Merrem (PCN allergy). D/C today as he has received 7 days. Pct is low On flagyl for presumed C diff but no diarrhea. Will D/C.  ENDOCRINE A:   H/o DI P:   Cont DDAVP Stress dose steroids for hypotension, no taper  NEUROLOGIC A:   No acute  P:   RASS goal: 0 PRN analgesia  Percocet 1 tab q4PRN for pain at the port  FAMILY  - Updates: Patient updated bedside 12/26, 12/27. - Inter-disciplinary family meet or Palliative Care meeting due by:  07/06/16  Critical care time- 35 mins.  Marshell Garfinkel MD [facility] Pager [phone] If no answer  or after 3pm call: [phone] 07/06/2016, 7:52 AM

## 2016-07-06 NOTE — Progress Notes (Signed)
Patient has been complaining of increased epigastric pain today which and worsened throughout the day. Pain medication has been given and CMM was made aware earlier today and an abdominal x-ray was added. Based on the result of the x-ray and the patient's continued pain which is a 7/10, Elink was paged about continued pain and to see if any other interventions should be made. ELink to return call. Will continue to monitor patient. All vital signs stable.

## 2016-07-07 ENCOUNTER — Inpatient Hospital Stay (HOSPITAL_COMMUNITY): Payer: Medicare Other

## 2016-07-07 ENCOUNTER — Ambulatory Visit (HOSPITAL_COMMUNITY): Payer: Medicare Other

## 2016-07-07 DIAGNOSIS — M7989 Other specified soft tissue disorders: Secondary | ICD-10-CM

## 2016-07-07 LAB — BASIC METABOLIC PANEL
ANION GAP: 6 (ref 5–15)
BUN: 15 mg/dL (ref 6–20)
CALCIUM: 8.1 mg/dL — AB (ref 8.9–10.3)
CO2: 30 mmol/L (ref 22–32)
Chloride: 103 mmol/L (ref 101–111)
Creatinine, Ser: 0.68 mg/dL (ref 0.61–1.24)
GFR calc non Af Amer: >60 mL/min (ref >60)
Glucose, Bld: 95 mg/dL (ref 65–99)
Potassium: 3.9 mmol/L (ref 3.5–5.1)
SODIUM: 139 mmol/L (ref 135–145)

## 2016-07-07 LAB — CBC
HEMATOCRIT: 27.9 % — AB (ref 39.0–52.0)
HEMOGLOBIN: 9.4 g/dL — AB (ref 13.0–17.0)
MCH: 32.9 pg (ref 26.0–34.0)
MCHC: 33.7 g/dL (ref 30.0–36.0)
MCV: 97.6 fL (ref 78.0–100.0)
Platelets: 50 10*3/uL — ABNORMAL LOW (ref 150–400)
RBC: 2.86 MIL/uL — ABNORMAL LOW (ref 4.22–5.81)
RDW: 19.2 % — AB (ref 11.5–15.5)
WBC: 8.2 10*3/uL (ref 4.0–10.5)

## 2016-07-07 LAB — PROCALCITONIN

## 2016-07-07 LAB — GLUCOSE, CAPILLARY: GLUCOSE-CAPILLARY: 102 mg/dL — AB (ref 65–99)

## 2016-07-07 MED ORDER — SIMETHICONE 80 MG PO CHEW
80.0000 mg | CHEWABLE_TABLET | Freq: Four times a day (QID) | ORAL | Status: DC | PRN
  Administered 2016-07-07 – 2016-07-08 (×3): 80 mg via ORAL
  Filled 2016-07-07 (×4): qty 1

## 2016-07-07 MED ORDER — POLYETHYLENE GLYCOL 3350 17 G PO PACK
17.0000 g | PACK | Freq: Every day | ORAL | Status: DC
  Administered 2016-07-07 – 2016-07-11 (×3): 17 g via ORAL
  Filled 2016-07-07 (×5): qty 1

## 2016-07-07 NOTE — Progress Notes (Addendum)
Nutrition Follow-up  DOCUMENTATION CODES:   Non-severe (moderate) malnutrition in context of acute illness/injury  INTERVENTION:   Diet advancement per MD Pt has had inconsistent PO intakes since 12/13. If within [place] and pt is unable to have diet advanced soon, will need to consider nutrition support given malnutrition and increased needs from cancer treatment.  ADDENDUM: Noted diet advancement to CLD. Will monitor intakes.  RD to continue to monitor  NUTRITION DIAGNOSIS:   Increased nutrient needs related to cancer and cancer related treatments as evidenced by estimated needs.  Ongoing.  GOAL:   Patient will meet greater than or equal to 90% of their needs  Not meeting.  MONITOR:   Diet advancement, Labs, Weight trends, Skin, I & O's  ASSESSMENT:   75 y.o. male with medical history significant of recently diagnosed stage IV NSCLC (80% PDL1+) being treated with pembrolizomab (Keytruda, immunotherapy), recent SBO (which resolved with conservative management), DVT, multifocal PE (with possible HIT) on Eliquis now, GERD, multiple myeloma with skull lesion (s/p of radiation therapy 2016), chronic prostatitis, BPH, dCHF and diabetes insipidus on DDAVP, who presents with shortness of breath abdominal pain. Found to have recurrent partial SBO. Patient also reported intermittent episodes of chest discomfort (especially with deep breathing).   Diet history during this admission: 12/19 ->FLD 12/20 -> Soft diet, then NPO after episode of vomiting 12/21-> CLD 12/22 -> Regular diet 12/27 -> made NPO for bowel rest d/t SBO  Pt refusing to have NGT replaced. Surgery does not recommend surgical intervention as of surgery note from 12/25. Pt has had inconsistent PO intakes since 12/13. If within [place] and pt is unable to have diet advanced soon, will need to consider nutrition support given malnutrition, and increased needs from cancer treatment.  Medications: Vitamin D tablet daily Labs  reviewed: CBGs: 99-102  Diet Order:  Diet NPO time specified  Skin:  Wound (see comment) (R foot non-pressure wound)  Last BM:  12/26  Height:   Ht Readings from Last 1 Encounters:  06/24/16 '5\' 10"'$  (1.778 m)    Weight:   Wt Readings from Last 1 Encounters:  07/07/16 211 lb 3.2 oz (95.8 kg)    Ideal Body Weight:  75.5 kg  BMI:  Body mass index is 30.3 kg/m.  Estimated Nutritional Needs:   Kcal:  2200-2400  Protein:  115-125g  Fluid:  2.2L/day  EDUCATION NEEDS:   No education needs identified at this time  Clayton Bibles, MS, RD, LDN Pager: [phone] After Hours Pager: [phone]

## 2016-07-07 NOTE — Progress Notes (Signed)
**  Preliminary report by tech**  Bilateral upper extremity venous duplex complete. There is no obvious evidence of deep or superficial vein thrombosis involving the right and left upper extremities. All clearly visualized vessels appear patent and compressible. Results were given to the patient's nurse, Hoyle Sauer.  07/07/16 2:11 PM [name] RVT

## 2016-07-07 NOTE — Progress Notes (Signed)
PULMONARY / CRITICAL CARE MEDICINE   Name: [name] MRN: [id number] DOB: 1941/01/30    ADMISSION DATE:  06/24/2016 CONSULTATION DATE:  06/29/16  REFERRING MD:  Katheran James, MD  CHIEF COMPLAINT:  Sepsis, gram-negative rod bacteremia  HISTORY OF PRESENT ILLNESS:   75 year old with stage IV adenocarcinoma, arthritis, BPH, DVT. He has diagnosis of stage IVB (T1b, N2, M1c) non-small cell lung cancer, adenocarcinoma with metastases to the brain diagnosed in October 2017. He was undergoing chemotherapy with Prembrolizomab and recent dx of recurrent SBO, PE on eliquis who was admitted on 12/15 with severe abdominal pain. He was assessed by surgery who recommended conservative management with IVF and NG decompression.  On 06/29/16 he developed nausea and projectile vomiting, decreased blood pressure. Noted to have gram-negative rod bacteremia. He was transferred to stepdown for further management and PCCM consulted.  SUBJECTIVE:  Had some abdominal cramping, KUB ordered which showed dilated loops of bowel.  NG attempted, failed.  Now feels OK, says he has gas pain.  Passing gas.  Wants to have a laxative, wants to get out of bed.  VITAL SIGNS: BP 94/72 (BP Location: Right Arm)   Pulse 94   Temp 98.3 F (36.8 C) (Oral)   Resp 14   Ht _0  (1.778 m)   Wt 95.8 kg (211 lb 3.2 oz)   SpO2 96%   BMI 30.30 kg/m   HEMODYNAMICS:    VENTILATOR SETTINGS:   INTAKE / OUTPUT: I/O last 3 completed shifts: In: 655.6 [I.V.:245.6; Other:10; IV Piggyback:400] Out: 1240 [Urine:1240]  PHYSICAL EXAMINATION: General:  Awake, alert, no distress HENT: NCAT OP clear PULM: CTA B CV: RRR, no mgr GI: BS+, soft, non-tender MSK: normal bulk and tone Derm: massive edema arms and legs Neuro: Awake and alert, no distress  LABS:  BMET  Recent Labs Lab 07/05/16 0534 07/06/16 0500 07/07/16 0511  NA 137 133* 139  K 3.1* 4.0 3.9  CL 100* 99* 103  CO2 _1 BUN _2 CREATININE 0.61  0.67 0.68  GLUCOSE 123* 100* 95    Electrolytes  Recent Labs Lab 07/04/16 0522 07/05/16 0534 07/06/16 0500 07/07/16 0511  CALCIUM 7.5* 7.1* 7.4* 8.1*  MG 1.7 1.9 1.8  --   PHOS 1.7* 2.5 2.5  --     CBC  Recent Labs Lab 07/05/16 0534 07/06/16 0500 07/07/16 0511  WBC 4.5 6.3 8.2  HGB 8.2* 8.4* 9.4*  HCT 23.9* 25.5* 27.9*  PLT 44* 47* 50*    Coag's  Recent Labs Lab 06/30/16 1132  INR 1.61    Sepsis Markers  Recent Labs Lab 06/30/16 1132  07/05/16 0534 07/06/16 0500 07/06/16 0800 07/07/16 0511  LATICACIDVEN 2.1*  --   --   --  1.9  --   PROCALCITON  --   < > <0.10 <0.10  --  <0.10  < > = values in this interval not displayed.  ABG No results for input(s): PHART, PCO2ART, PO2ART in the last 168 hours.  Liver Enzymes No results for input(s): AST, ALT, ALKPHOS, BILITOT, ALBUMIN in the last 168 hours.  Cardiac Enzymes No results for input(s): TROPONINI, PROBNP in the last 168 hours.  Glucose  Recent Labs Lab 07/05/16 2158 07/06/16 0803 07/06/16 1223 07/06/16 1652 07/06/16 2128 07/07/16 0726  GLUCAP 117* 110* 103* 119* 99 102*    Imaging Dg Abd 1 View  Result Date: 07/06/2016 CLINICAL DATA:  Acute generalized abdominal pain. EXAM: ABDOMEN - 1 VIEW COMPARISON:  Radiographs  of June 27, 2016. FINDINGS: Moderately dilated small bowel loops are now seen concerning for distal small bowel obstruction. IVC filter is noted. Phleboliths are noted in the pelvis. IMPRESSION: Moderately dilated small bowel loops are noted concerning for distal small bowel obstruction. Follow-up radiographs recommended. Electronically Signed   By: Marijo Conception, M.D.   On: 07/06/2016 13:43   Dg Chest Port 1 View  Result Date: 07/07/2016 CLINICAL DATA:  Respiratory failure EXAM: PORTABLE CHEST 1 VIEW COMPARISON:  July 06, 2016 FINDINGS: Port-A-Cath tip is at the cavoatrial junction. No pneumothorax. There is stable patchy airspace opacity in the right lower lung  zone and left mid lung region. Lungs elsewhere clear. Heart is upper normal in size with pulmonary vascularity within normal limits. There is atherosclerotic calcification aorta. No adenopathy evident. Bones appear somewhat osteoporotic. IMPRESSION: Patchy infiltrate in the right lower lung zone and left mid lung regions, stable. No pneumothorax. Stable cardiac silhouette. There is aortic atherosclerosis. Electronically Signed   By: Lowella Grip III M.D.   On: 07/07/2016 07:45    STUDIES:  Venous US 12/21>>>Findings consistent with acute deep vein thrombosis involving peroneal, popliteal, and distal femoral arteryof the right lower extremity. - Findings consistent with acute deep vein thrombosis involving the   popliteal and gastrocnemius veins of the left lower extremity. - Findings consistent with chronic deep vein thrombosis involving   them proximal posterior tibial vein of the right lower extremity. - No evidence of a superficial thrombosis of the right or left   lower extremity. CT abd/pelvis 12/20: Persistently dilated fluid and gas filled small bowel loops to level of the right lower quadrant/ distal ilium with abrupt transition without mass identified and therefore may be related to stricture/adhesion.Interval development of abnormal thickening of the ascending colon and proximal descending colon with surrounding inflammation of fat planes suggesting colitis whether inflammatory infectious or ischemic.  CULTURES: BCX2 12/15: bacteroides vulgatus BCX2 12/20>>> negative  ANTIBIOTICS: Merrem 12/20>>> 12/27 Flagyl 12/21>> 12/26  SIGNIFICANT EVENTS:  LINES/TUBES: Right port date unknown R IJ removed  DISCUSSION: 75 yo w/ NSCLC, recurrent bowel obstruction, PE on NOAC. Admitted w/ recurrent SBO & GNR bacteremia on 12/15. PCCM asked to see on 12/20 when he became progressively hypotensive. CT imaging of abd raising concern for ischemia vs infectious colitis. New acute LE DVT  in-spite of NOAC, which we had to stop 12/21 d/t colitis related GIB and hgb drop of almost 2 gms. Now s/p IVC filter. Surgery following and recommends against surgery and as of 12/27 he was tolerating a diet.  Had some gas pain on 12/27, failed NG but feels OK 12/28 with normal bowel sounds and passing gas.  ASSESSMENT / PLAN:  PULMONARY A: Cough  P:   O2 as needed, titrate down as able Follow CXR  CARDIOVASCULAR A:  Severe sepsis/septic shock > resolved Hypotension> improving P:  Continue midodrine q8h Monitor BP  RENAL A:   H/o BPH  Chronic prostatitis  Hypernatremia -->resolved Hyperchloremia > resolved H/o DI  Hypokalemia  > resolved P:   Strict I&O No lasix Cont DDAVP Monitor BMET and UOP Replace electrolytes as needed   GASTROINTESTINAL A:   SBO > resolved Gas with abdominal distension but ileus unlikely based on exam 12/28 Colitis ? Ischemic vs infectious-->favor ischemia in setting of SBO, resolved P:   Clears today miralax prn Simethicone, add Out of bed   PPI  HEMATOLOGIC A:   NSCLC (80% PDL1+)-->treateing w/ Keytruda H/o PE and DVT on Eliquis-->new  DVTs on NOAC 12/21 Possible HIT H/o Multiple Myeloma w/ skull lesion s/p XRT 2016) Blood loss anemia > resolved P:  Trend CBC Temp Filter placed 12/21. Continue Xarelto with close monitoring for bleeding   INFECTIOUS A:   Bacteroides Vulgatus  bacteremia (beta lactamase +) > suspect transient bacterial translocation   P:   Monitor off of antibiotics  ENDOCRINE A:   H/o DI P:   Cont DDAVP Stress dose steroids for hypotension, no taper 12/28, consider tapering 12/29  NEUROLOGIC A:   Mild pain  P:   PRN analgesia  Percocet 1 tab q4PRN for pain at the port  FAMILY  - Updates: Patient updated bedside 12/26, 12/27, 12/28 - Inter-disciplinary family meet or Palliative Care meeting due by:  07/06/16   Roselie Awkward, MD [place] [facility] Pager: [phone] Cell: [phone] After 3pm or  if no response, call [phone]   07/07/2016, 11:20 AM

## 2016-07-07 NOTE — Progress Notes (Signed)
Pt has been using saline nasal spray throughout the night to loosen nasal mucous. Attempted to insert NG tube again, pt refused. Stated his pain was "gas related" and that he decided he "did not want an NG tube for the 4th time." Pt has asked to speak to MD during rounds and requests a laxative/enema instead.   Abdomen is not distended or tender on assessment, bowel sounds are active in all quads, not hyperactive or high-pitched. Pt c/o intermittent abdominal pain that "feels like gas I can't pass." Educated on medications that cause constipation, such as morphine, which he has frequently requested. Pt decided to hold pain medications until rounds with MD, stating that the pain was "not that bad, maybe a 4."   Will continue to monitor.

## 2016-07-07 NOTE — Progress Notes (Signed)
Physical Therapy Treatment Patient Details Name: [name] MRN: [id number] DOB: 1940-09-21 Today's Date: 07/07/2016    History of Present Illness [name] is a 75 y.o. male with medical history significant of recently diagnosed stage IV NSCLC (80% PDL1+) being treated with pembrolizomab Rande Lawman, immunotherapy), recent SBO (thought to be due to adhesions), DVT, multifocal PE (with possible HIT) on Eliquis, Gerd, multiple myeloma with skull lesion (s/p of radiation therapy 2016), chronic prostatitis, BPH, dCHF, diabetes insipidus on DDAVP, who presents with shortness of breath abdominal pain.    PT Comments    Pt continues ltd by orthostatic BP but did progress to stand pvt transfer to chair with RW and min assist of 2.  BP supine 102/79, sitting 75/62, after sitting 5+ minutes 89/62 and following pvt to chair 92/64.  Pt c/o mild dizziness from time of initial sitting until up in recliner with LEs elevated.  RN aware.  Follow Up Recommendations  CIR     Equipment Recommendations  None recommended by PT    Recommendations for Other Services OT consult     Precautions / Restrictions Precautions Precautions: Fall Precaution Comments: monitor BP, hypotensive Restrictions Weight Bearing Restrictions: No    Mobility  Bed Mobility Overal bed mobility: Needs Assistance Bed Mobility: Supine to Sit     Supine to sit: Min guard        Transfers Overall transfer level: Needs assistance Equipment used: Rolling walker (2 wheeled) Transfers: Sit to/from [employer] Sit to Stand: Min assist;+2 safety/equipment Stand pivot transfers: Min assist;+2 safety/equipment       General transfer comment: assist to rise and stabilize; +2 lines/safety   Ambulation/Gait             General Gait Details: Bed to chair stand pvt only 28 hypotension   Stairs            Wheelchair Mobility    Modified Rankin (Stroke Patients Only)       Balance  Overall balance assessment: Needs assistance Sitting-balance support: No upper extremity supported;Feet supported Sitting balance-Leahy Scale: Fair     Standing balance support: Bilateral upper extremity supported Standing balance-Leahy Scale: Poor                      Cognition Arousal/Alertness: Awake/alert Behavior During Therapy: WFL for tasks assessed/performed Overall Cognitive Status: Within Functional Limits for tasks assessed                      Exercises      General Comments        Pertinent Vitals/Pain Pain Assessment: No/denies pain    Home Living                      Prior Function            PT Goals (current goals can now be found in the care plan section) Acute Rehab PT Goals Patient Stated Goal: rehab at [facility] PT Goal Formulation: With patient Time For Goal Achievement: 07/13/16 Potential to Achieve Goals: Fair Progress towards PT goals: Progressing toward goals    Frequency    Min 3X/week      PT Plan Current plan remains appropriate    Co-evaluation PT/OT/SLP Co-Evaluation/Treatment: Yes Reason for Co-Treatment: For patient/therapist safety;Complexity of the patient's impairments (multi-system involvement) PT goals addressed during session: Mobility/safety with mobility OT goals addressed during session: ADL's and self-care     End of  Session Equipment Utilized During Treatment: Gait belt Activity Tolerance: Other (comment);Patient limited by fatigue (orthostatic) Patient left: in chair;with call bell/phone within reach;with nursing/sitter in room;with family/visitor present     Time: 7494-4967 PT Time Calculation (min) (ACUTE ONLY): 23 min  Charges:  $Therapeutic Activity: 8-22 mins                    G Codes:      Robin Pafford 08/04/16, 1:03 PM

## 2016-07-07 NOTE — Progress Notes (Signed)
Occupational Therapy Treatment Patient Details Name: [name] MRN: [id number] DOB: Jul 06, 1941 Today's Date: 07/07/2016    History of present illness [name] is a 75 y.o. male with medical history significant of recently diagnosed stage IV NSCLC (80% PDL1+) being treated with pembrolizomab [name], immunotherapy), recent SBO (thought to be due to adhesions), DVT, multifocal PE (with possible HIT) on Eliquis, Gerd, multiple myeloma with skull lesion (s/p of radiation therapy 2016), chronic prostatitis, BPH, dCHF, diabetes insipidus on DDAVP, who presents with shortness of breath abdominal pain.   OT comments  Pt remains very motivated and very much wants to go to CIR for rehab.  Better activity tolerance today and pt was able perform SPT to chair.  BPs low but pt able to continue sitting and they slowly rose.   see ADL comments.    Follow Up Recommendations  CIR    Equipment Recommendations   (to be further assessed)    Recommendations for Other Services      Precautions / Restrictions Precautions Precautions: Fall Precaution Comments: monitor BP, hypotensive Restrictions Weight Bearing Restrictions: No       Mobility Bed Mobility         Supine to sit: Min guard (for safety/lines)        Transfers   Equipment used: Rolling walker (2 wheeled) Transfers: Sit to/from [facility] Sit to Stand: Min assist;+2 safety/equipment Stand pivot transfers: Min assist;+2 safety/equipment       General transfer comment: assist to rise and stabilize; +2 lines/safety     Balance     Sitting balance-Leahy Scale: Fair     Standing balance support:  (pt leaned forward when sitting after first minute)                       ADL                                         General ADL Comments: pt sat eob x  5-6 minutes. Did not perform ADLs as we took vitals often.  BP dropped but pt responsive throughout and dizziness did  not get worse.  Able to perform SPT to chair; did not take BP to avoid prolonged standing at this time.  Initial BP 102/79 supine.  Sitting were 75/62, 88/63 and 89/62 respectively.  After SPT to recliner, BP was 92/64      Vision                     Perception     Praxis      Cognition   Behavior During Therapy: [facility] for tasks assessed/performed Overall Cognitive Status: Within Functional Limits for tasks assessed                       Extremity/Trunk Assessment               Exercises     Shoulder Instructions       General Comments      Pertinent Vitals/ Pain       Pain Assessment: No/denies pain  Home Living                                          Prior Functioning/Environment  Frequency           Progress Toward Goals  OT Goals(current goals can now be found in the care plan section)  Progress towards OT goals:  (goals revised today)  Acute Rehab OT Goals Patient Stated Goal: rehab at [facility] OT Goal Formulation: With patient Time For Goal Achievement: 07/21/16 ADL Goals Pt Will Perform Grooming: with min guard assist;sitting (x 2 activities) Pt Will Transfer to Toilet: with min guard assist;bedside commode;stand pivot transfer Pt Will Perform Toileting - Clothing Manipulation and hygiene: with min assist;sit to/from stand Additional ADL Goal #1: pt will complete UB adls with set up in unsupported sitting Additional ADL Goal #2: pt will perform UE strengthening program (AROM and level one theraband) with supervision and written HEP  Plan      Co-evaluation      Reason for Co-Treatment: Complexity of the patient's impairments (multi-system involvement) PT goals addressed during session: Mobility/safety with mobility OT goals addressed during session: ADL's and self-care      End of Session     Activity Tolerance Patient tolerated treatment well   Patient Left in chair;with call  bell/phone within reach;with family/visitor present   Nurse Communication          Time: 1552-0802 OT Time Calculation (min): 23 min  Charges: OT General Charges $OT Visit: 1 Procedure OT Treatments $Therapeutic Activity: 8-22 mins  Tattiana Fakhouri 07/07/2016, 12:57 PM  Lesle Chris, OTR/L [phone] 07/07/2016

## 2016-07-08 ENCOUNTER — Inpatient Hospital Stay (HOSPITAL_COMMUNITY): Payer: Medicare Other

## 2016-07-08 DIAGNOSIS — R1084 Generalized abdominal pain: Secondary | ICD-10-CM

## 2016-07-08 LAB — BASIC METABOLIC PANEL
ANION GAP: 8 (ref 5–15)
BUN: 17 mg/dL (ref 6–20)
CALCIUM: 8.7 mg/dL — AB (ref 8.9–10.3)
CO2: 30 mmol/L (ref 22–32)
Chloride: 107 mmol/L (ref 101–111)
Creatinine, Ser: 0.81 mg/dL (ref 0.61–1.24)
GFR calc non Af Amer: >60 mL/min (ref >60)
GLUCOSE: 127 mg/dL — AB (ref 65–99)
POTASSIUM: 3.6 mmol/L (ref 3.5–5.1)
Sodium: 145 mmol/L (ref 135–145)

## 2016-07-08 LAB — CBC WITH DIFFERENTIAL/PLATELET
BASOS ABS: 0 10*3/uL (ref 0.0–0.1)
BASOS PCT: 0 %
Eosinophils Absolute: 0 10*3/uL (ref 0.0–0.7)
Eosinophils Relative: 0 %
HEMATOCRIT: 30.8 % — AB (ref 39.0–52.0)
HEMOGLOBIN: 10.1 g/dL — AB (ref 13.0–17.0)
LYMPHS PCT: 4 %
Lymphs Abs: 0.4 10*3/uL — ABNORMAL LOW (ref 0.7–4.0)
MCH: 32.7 pg (ref 26.0–34.0)
MCHC: 32.8 g/dL (ref 30.0–36.0)
MCV: 99.7 fL (ref 78.0–100.0)
MONO ABS: 0.5 10*3/uL (ref 0.1–1.0)
MONOS PCT: 4 %
NEUTROS PCT: 92 %
Neutro Abs: 9.9 10*3/uL — ABNORMAL HIGH (ref 1.7–7.7)
Platelets: 62 10*3/uL — ABNORMAL LOW (ref 150–400)
RBC: 3.09 MIL/uL — ABNORMAL LOW (ref 4.22–5.81)
RDW: 20.1 % — ABNORMAL HIGH (ref 11.5–15.5)
WBC: 10.7 10*3/uL — ABNORMAL HIGH (ref 4.0–10.5)

## 2016-07-08 LAB — GLUCOSE, CAPILLARY
GLUCOSE-CAPILLARY: 100 mg/dL — AB (ref 65–99)
GLUCOSE-CAPILLARY: 123 mg/dL — AB (ref 65–99)
GLUCOSE-CAPILLARY: 125 mg/dL — AB (ref 65–99)
GLUCOSE-CAPILLARY: 140 mg/dL — AB (ref 65–99)
GLUCOSE-CAPILLARY: 94 mg/dL (ref 65–99)
Glucose-Capillary: 140 mg/dL — ABNORMAL HIGH (ref 65–99)
Glucose-Capillary: 111 mg/dL — ABNORMAL HIGH (ref 65–99)

## 2016-07-08 MED ORDER — HYDROCORTISONE NA SUCCINATE PF 100 MG IJ SOLR
50.0000 mg | Freq: Three times a day (TID) | INTRAMUSCULAR | Status: DC
  Administered 2016-07-09 – 2016-07-12 (×11): 50 mg via INTRAVENOUS
  Filled 2016-07-08 (×11): qty 2

## 2016-07-08 MED ORDER — SODIUM CHLORIDE 0.9 % IV SOLN
INTRAVENOUS | Status: AC
  Administered 2016-07-09: 06:00:00 via INTRAVENOUS
  Administered 2016-07-09: 1000 mL via INTRAVENOUS

## 2016-07-08 MED ORDER — SIMETHICONE 80 MG PO CHEW
80.0000 mg | CHEWABLE_TABLET | Freq: Four times a day (QID) | ORAL | Status: DC
  Administered 2016-07-08 – 2016-07-12 (×15): 80 mg via ORAL
  Filled 2016-07-08 (×17): qty 1

## 2016-07-08 MED ORDER — MORPHINE SULFATE (PF) 4 MG/ML IV SOLN
3.0000 mg | INTRAVENOUS | Status: DC | PRN
  Administered 2016-07-08 – 2016-07-09 (×4): 3 mg via INTRAVENOUS
  Filled 2016-07-08 (×4): qty 1

## 2016-07-08 NOTE — Progress Notes (Signed)
Physical Therapy Treatment Patient Details Name: [name] MRN: [id number] DOB: 05/15/1941 Today's Date: 07/08/2016    History of Present Illness [name] is a 75 y.o. male with medical history significant of recently diagnosed stage IV NSCLC (80% PDL1+) being treated with pembrolizomab [name], immunotherapy), recent SBO (thought to be due to adhesions), DVT, multifocal PE (with possible HIT) on Eliquis, Gerd, multiple myeloma with skull lesion (s/p of radiation therapy 2016), chronic prostatitis, BPH, dCHF, diabetes insipidus on DDAVP, who presents with shortness of breath abdominal pain.    PT Comments    Pt continues very motivated but ltd by ongoing nausea and orthostatic BP.  This date, BP supine 105/79, initial sitting 87/74, and sitting x 5 min 92/57.    Follow Up Recommendations  CIR     Equipment Recommendations  None recommended by PT    Recommendations for Other Services OT consult     Precautions / Restrictions Precautions Precautions: Fall Precaution Comments: monitor BP, hypotensive Restrictions Weight Bearing Restrictions: No    Mobility  Bed Mobility Overal bed mobility: Needs Assistance Bed Mobility: Supine to Sit Rolling: Supervision   Supine to sit: Supervision Sit to supine: Min guard   General bed mobility comments: Pt to EOB unassisted but c/o increasing nausea/dizziness with increased time in sitting and requests back to bed  Transfers                 General transfer comment: NT 2* nausea and dizziness with move to sitting  Ambulation/Gait                 Stairs            Wheelchair Mobility    Modified Rankin (Stroke Patients Only)       Balance Overall balance assessment: Needs assistance Sitting-balance support: No upper extremity supported;Feet supported Sitting balance-Leahy Scale: Fair                              Cognition Arousal/Alertness: Awake/alert Behavior During  Therapy: WFL for tasks assessed/performed Overall Cognitive Status: Within Functional Limits for tasks assessed Area of Impairment: Orientation;Memory;Safety/judgement;Problem solving Orientation Level: Time           Problem Solving: Slow processing;Requires verbal cues      Exercises General Exercises - Lower Extremity Ankle Circles/Pumps: AROM;Both;20 reps;Supine Quad Sets: AROM;Both;10 reps;Supine Heel Slides: AROM;15 reps;Both;Supine (mild manual resistance) Hip ABduction/ADduction: AROM;Both;15 reps;Supine (mild manual resistance)    General Comments        Pertinent Vitals/Pain Pain Assessment: Faces Faces Pain Scale: Hurts whole lot Pain Location: intermittent abdominal pain Pain Descriptors / Indicators: Cramping Pain Intervention(s): Limited activity within patient's tolerance;Monitored during session    Home Living                      Prior Function            PT Goals (current goals can now be found in the care plan section) Acute Rehab PT Goals Patient Stated Goal: rehab at [facility] PT Goal Formulation: With patient Time For Goal Achievement: 07/13/16 Potential to Achieve Goals: Fair Progress towards PT goals: Progressing toward goals    Frequency    Min 3X/week      PT Plan Current plan remains appropriate    Co-evaluation             End of Session Equipment Utilized During Treatment: Gait belt Activity  Tolerance: Patient limited by fatigue;Other (comment) (nausea and dizziness) Patient left: in bed;with call bell/phone within reach;with nursing/sitter in room     Time: 1041-1120 PT Time Calculation (min) (ACUTE ONLY): 39 min  Charges:  $Therapeutic Exercise: 8-22 mins $Therapeutic Activity: 8-22 mins                    G Codes:      Danijah Noh 2016-07-23, 3:16 PM

## 2016-07-08 NOTE — Progress Notes (Signed)
Patient ID: [name], male   DOB: [birth year], 75 y.o.   MRN: [id number]  [facility] Surgery Progress Note     Subjective: Patient has a PMH stage IVB(T1b, N2, M1c) non-small cell lung cancer, adenocarcinoma with metastases to the brain status post stereotactic radiotherapy. He was started on treatment with immunotherapy Nat Math) and is s/p 1 cycle; this has been held for several weeks. Patient also has a h/o PE status post IVC filter placement currently off anticoagulation.   General surgery reconsulted because small bowel obstruction does not appear to be improving. Last seen on 07/04/16 where he appeared to be improving. Patient states that he never fully got better and now he is having intermittent colicky diffuse abdominal pain. The pain is random and severe. Last BM was 3 days ago. Tried to drink some fluids this morning and he became nauseated and vomited. States that this has been going on for a few days. NG tube placement was attempted multiple times and failed. He is passing flatus. States that he cannot take the pain anymore and wants to have surgery.  Objective: Vital signs in last 24 hours: Temp:  [97.5 F (36.4 C)-99.5 F (37.5 C)] 97.5 F (36.4 C) (12/29 1525) Pulse Rate:  [88-101] 101 (12/29 1525) Resp:  [9-18] 17 (12/29 1525) BP: (88-119)/(63-93) 118/84 (12/29 1525) SpO2:  [92 %-99 %] 94 % (12/29 0848) Weight:  [200 lb 13.4 oz (91.1 kg)] 200 lb 13.4 oz (91.1 kg) (12/29 0500) Last BM Date: 07/05/16  Intake/Output from previous day: 12/28 0701 - 12/29 0700 In: 240 [P.O.:240] Out: 1350 [Urine:1250; Emesis/NG output:100] Intake/Output this shift: No intake/output data recorded.  PE: Gen:  Alert, NAD, pleasant Card:  RRR, no M/G/R heard Pulm:  CTAB, no W/R/R Abd: Soft, mild distension, +BS, TTP right side of abdomen > left  Lab Results:   Recent Labs  07/07/16 0511 07/08/16 0545  WBC 8.2 10.7*  HGB 9.4* 10.1*  HCT 27.9* 30.8*  PLT 50* 62*    BMET  Recent Labs  07/07/16 0511 07/08/16 0545  NA 139 145  K 3.9 3.6  CL 103 107  CO2 30 30  GLUCOSE 95 127*  BUN 15 17  CREATININE 0.68 0.81  CALCIUM 8.1* 8.7*   PT/INR No results for input(s): LABPROT, INR in the last 72 hours. CMP     Component Value Date/Time   NA 145 07/08/2016 0545   NA 132 (L) 06/15/2016 0852   K 3.6 07/08/2016 0545   K 4.1 06/15/2016 0852   CL 107 07/08/2016 0545   CL 109 (H) 12/31/2012 0759   CO2 30 07/08/2016 0545   CO2 23 06/15/2016 0852   GLUCOSE 127 (H) 07/08/2016 0545   GLUCOSE 101 06/15/2016 0852   GLUCOSE 116 (H) 12/31/2012 0759   BUN 17 07/08/2016 0545   BUN 19.0 06/15/2016 0852   CREATININE 0.81 07/08/2016 0545   CREATININE 0.8 06/15/2016 0852   CALCIUM 8.7 (L) 07/08/2016 0545   CALCIUM 8.5 06/15/2016 0852   PROT 5.2 (L) 06/24/2016 1622   PROT 5.6 (L) 06/15/2016 0852   ALBUMIN 2.5 (L) 06/24/2016 1622   ALBUMIN 2.8 (L) 06/15/2016 0852   AST 18 06/24/2016 1622   AST 10 06/15/2016 0852   ALT 24 06/24/2016 1622   ALT 27 06/15/2016 0852   ALKPHOS 70 06/24/2016 1622   ALKPHOS 77 06/15/2016 0852   BILITOT 0.6 06/24/2016 1622   BILITOT 0.99 06/15/2016 0852   GFRNONAA >60 07/08/2016 0545   GFRAA >60  07/08/2016 0545   Lipase     Component Value Date/Time   LIPASE 21 06/24/2016 1622       Studies/Results: Dg Chest Port 1 View  Result Date: 07/07/2016 CLINICAL DATA:  Respiratory failure EXAM: PORTABLE CHEST 1 VIEW COMPARISON:  July 06, 2016 FINDINGS: Port-A-Cath tip is at the cavoatrial junction. No pneumothorax. There is stable patchy airspace opacity in the right lower lung zone and left mid lung region. Lungs elsewhere clear. Heart is upper normal in size with pulmonary vascularity within normal limits. There is atherosclerotic calcification aorta. No adenopathy evident. Bones appear somewhat osteoporotic. IMPRESSION: Patchy infiltrate in the right lower lung zone and left mid lung regions, stable. No pneumothorax.  Stable cardiac silhouette. There is aortic atherosclerosis. Electronically Signed   By: Lowella Grip III M.D.   On: 07/07/2016 07:45    Anti-infectives: Anti-infectives    Start     Dose/Rate Route Frequency Ordered Stop   06/30/16 0830  metroNIDAZOLE (FLAGYL) IVPB 500 mg  Status:  Discontinued     500 mg 100 mL/hr over 60 Minutes Intravenous Every 8 hours 06/30/16 0735 07/05/16 1045   06/29/16 1800  meropenem (MERREM) 1 g in sodium chloride 0.9 % 100 mL IVPB  Status:  Discontinued     1 g 200 mL/hr over 30 Minutes Intravenous Every 8 hours 06/29/16 1552 07/07/16 1126   06/29/16 1430  levofloxacin (LEVAQUIN) IVPB 750 mg  Status:  Discontinued     750 mg 100 mL/hr over 90 Minutes Intravenous Every 24 hours 06/29/16 1355 06/29/16 1543   06/25/16 1000  levofloxacin (LEVAQUIN) tablet 500 mg  Status:  Discontinued     500 mg Oral Daily 06/24/16 2026 06/25/16 0941       Assessment/Plan SBO, persistent - continues to have intermittent colicky abdominal pain associated with nausea and vomiting - last BM 3 days ago. He is passing flatus - last abdominal xray (12/27) shows moderately dilated small bowel loops concerning for distal small bowel obstruction  Colitis - treated with merrem (12/20>>12/28) and flagyl (12/21>>12/26) Stage IVB(T1b, N2, M1c) non-small cell lung cancer with metastases to the brain   - status post stereotactic radiotherapy  - He was started on treatment with immunotherapy Nat Math) and is s/p 1 cycle; this has been held for several weeks.   Sees Dr. [name] H/o PE status post IVC filter placement - currently off anticoagulation Anemia - Hg 10.1 (12/29) Diabetes insipidus - on DDAVP and Solucortef DM - SSI Hypotension - on midodrine '15mg'$  TID  Plan -  Patient with persistent SBO; began in November and does not seem to be resolving medically. He wants to consider surgery.  NGT attempted multiple times over the last 2-3 days but unsuccessful.  Recommend  updating abdominal xrays. Will discuss with MD.   LOS: 14 days    [name] , [facility] Surgery 07/08/2016, 4:09 PM Pager: [phone] Consults: [phone] Mon-Fri 7:00 am-4:30 pm Sat-Sun 7:00 am-11:30 am  Agree with above.  [name], MD, [facility] Surgery Pager: [phone] Office phone:  [phone]  Agree with above. Wife, daughter, Gilmore Laroche, and granddaughter, Steedman in room with patient.  He appears to have recurrent SBO.  His only prior surgery was a appendectomy remotely.  His CT scan on 06/29/2016 describes a "right lower quadrant/ distal ilium with abrupt transition without mass identified" General surgery last saw patient on 07/04/2016 when it looked like he had turned corner and SBO resolved.  But he never left the hospital.  He has underlying metastatic lung ca - though he and the family think he is "disease free". He has multiple myleoma, which they are not worrying about.  He has had DVT and has a filter in place.  And he was restarted on Xarelto with his last dose today.  Plan - keep NPO, hydrate, check KUB, and consider surgery after 3 day interval for Xarelto.  I have spoken to Dr. Darrick Meigs about the plan.  Discussed with my partner Dr. Hassell Done. Will follow.  [name], MD, [facility] Surgery Pager: [phone] Office phone:  [phone]

## 2016-07-08 NOTE — Consult Note (Signed)
Referring Provider: Curt Bears, MD Primary Care Physician:  [name], MD Primary Gastroenterologist:  Lucio Edward, MD  Reason for Consultation:  Bowel obstruction / abdominal pain  HPI: [name] is a 75 y.o. male with metastatic lung cancer (being treated with Prembrolizomab  ) who we saw during late November admission for small bowel obstruction. Patient spent several days in the hospital but finally improved after NG decompression. He went home on a low residue diet. Patient was readmitted for 3 days mid December with chest pain and shortness of breath. CTA chest revealed acute right lung PE. Patient was already on Eliquis and this was continued. He was discharged home around 12/13 but readmitted 12/15 with constipation, nausea and abdominal pain. He had been taking pain meds at home. Imaging studies revealed a recurrent partial obstruction. NGT placed. Surgery evaluated and symptoms improved within a couple of days. Diet advanced to soft on 12/19. On 12/20 patient developed nausea, vomiting and became hypotensive, transferred to ICU. Found to have gram negative rod bacteremia. PCCM saw for shock related to sepsis. Temporarily required vasopressors. He developed blood in stools.  CTscan suggested abnormal thickening of the ascending colon and proximal descending colon / surrounding inflammation of fat planes. Treated empirically with antibiotics. C-diff / GI pathogen panel negative. Surgery saw on 12/25, patient felt okay and diet advanced.    Past Medical History:  Diagnosis Date  . Adenocarcinoma of right lung, stage 4 ([place]) 05/27/2016  . Adenomatous polyp of colon 1996  . Arthritis    Osteoarthritis right knee, spine, wrist  . Arthritis of hand, right    Right thumb (Dr. Daylene Katayama)  . Back ache    r/o Multiple Myeloma  . Benign prostatic hypertrophy    (Dr. Karsten Ro in past)  . Degenerative disc disease   . Diverticulosis   . DVT (deep venous thrombosis) ([place]) 01/2014   R calf  . Encounter for antineoplastic chemotherapy 10/20/2015  . Erosive esophagitis   . GERD (gastroesophageal reflux disease)   . Hemorrhoids   . Hiatal hernia   . History of chronic prostatitis   . History of melanoma    Back (Dr. Wilhemina Bonito); early melanoma R arm 03/2011  . Multiple myeloma ([place]) 2013   skull lytic lesions--treated with radiation 07/2014  . Pulmonary embolism ([place]) 01/2014  . Radiation 07/21/14-08/05/14   left occipital parietal skull 25 gray  . SCC (squamous cell carcinoma) 08/04/15   right ear;dysplastic nevus with severe atypia-chest    Past Surgical History:  Procedure Laterality Date  . APPENDECTOMY    . BONE MARROW TRANSPLANT  03/2013   Stem Cell  . COLONOSCOPY  2009   adenomatous polyp  . ESOPHAGOGASTRODUODENOSCOPY  2009   mild inflammation at esophagogastric junction  . excision of melanoma     back ( x3)  . Great toe surgery     bilateral  . INGUINAL HERNIA REPAIR     left, x 2  . IR GENERIC HISTORICAL  06/30/2016   IR IVC FILTER PLMT / S&I Burke Keels GUID/MOD SED 06/30/2016 Jacqulynn Cadet, MD [facility]-INTERV RAD  . port placemet  10/2012  . SCALENE NODE BIOPSY Right 05/11/2016   Procedure: BIOPSY RIGHT SCALENE NODE;  Surgeon: Grace Isaac, MD;  Location: [facility];  Service: Thoracic;  Laterality: Right;  . TOTAL KNEE ARTHROPLASTY  06/08/2011   Procedure: TOTAL KNEE ARTHROPLASTY;  Surgeon: Ninetta Lights, MD;  Location: [facility];  Service: Orthopedics;  Laterality: Right;  osteonics  .  total knee replaced  2000   Left knee    Prior to Admission medications   Medication Sig Start Date End Date Taking? Authorizing Provider  acetaminophen (TYLENOL) 500 MG tablet Take 500 mg by mouth every 6 (six) hours as needed for moderate pain or headache.   Yes Historical Provider, MD  apixaban (ELIQUIS) 5 MG TABS tablet Take 2 tabs ('10mg'$ ) po BID for 7 days, then take 1 tab ('5mg'$ ) po BID 06/22/16  Yes Patrecia Pour, MD  ascorbic acid (VITAMIN C) 500 MG tablet Take 500 mg by  mouth 2 (two) times daily.    Yes Historical Provider, MD  cholecalciferol (VITAMIN D) 1000 UNITS tablet Take 1,000 Units by mouth at bedtime.    Yes Historical Provider, MD  desmopressin (DDAVP) 0.1 MG tablet Take 1 tablet (0.1 mg total) by mouth at bedtime. 04/25/16  Yes Rita Ohara, MD  dexamethasone (DECADRON) 4 MG tablet Take 1 tablet (4 mg total) by mouth 2 (two) times daily. Patient taking differently: Take 2 mg by mouth 2 (two) times daily.  06/13/16  Yes Barton Dubois, MD  fish oil-omega-3 fatty acids 1000 MG capsule Take 1 g by mouth at bedtime.    Yes Historical Provider, MD  furosemide (LASIX) 20 MG tablet Take 1 tablet (20 mg total) by mouth daily. Patient taking differently: Take 20 mg by mouth daily as needed for fluid.  02/29/16  Yes Rita Ohara, MD  LORazepam (ATIVAN) 0.5 MG tablet Take 1 tab PO Q 6 hours PRN nausea or anxiety Patient taking differently: Take 0.5 mg by mouth every 6 (six) hours as needed for anxiety (nausea).  06/24/16  Yes Curt Bears, MD  methocarbamol (ROBAXIN) 500 MG tablet TAKE 1 TO 2 TABLETS BY MOUTH EVERY 6 HOURS AS NEEDED FOR MUSCLE SPASMS 12/09/15  Yes Rita Ohara, MD  Multiple Vitamin (MULTIVITAMIN WITH MINERALS) TABS Take 1 tablet by mouth at bedtime.    Yes Historical Provider, MD  ondansetron (ZOFRAN) 8 MG tablet Take 1 tablet (8 mg total) by mouth every 8 (eight) hours as needed for nausea or vomiting. 05/18/16  Yes Susanne Borders, NP  oxyCODONE (ROXICODONE) 5 MG immediate release tablet Take 1 tablet (5 mg total) by mouth every 6 (six) hours as needed for severe pain. 06/22/16  Yes Patrecia Pour, MD  pantoprazole (PROTONIX) 40 MG tablet TAKE 1 TABLET BY MOUTH TWO  TIMES DAILY 05/21/16  Yes Curt Bears, MD  Probiotic Product (ALIGN) 4 MG CAPS Take 1 capsule by mouth daily. Reported on 09/10/2015   Yes Historical Provider, MD  prochlorperazine (COMPAZINE) 10 MG tablet Take 1 tablet (10 mg total) by mouth every 6 (six) hours as needed. Patient taking  differently: Take 10 mg by mouth every 6 (six) hours as needed for nausea or vomiting.  04/29/16  Yes Curt Bears, MD  tamsulosin (FLOMAX) 0.4 MG CAPS capsule Take 2 capsules (0.8 mg total) by mouth daily after supper. Patient taking differently: Take 0.8 mg by mouth 2 (two) times daily.  06/13/16  Yes Kathie Rhodes, MD  temazepam (RESTORIL) 15 MG capsule Take 1 capsule (15 mg total) by mouth at bedtime as needed. Patient taking differently: Take 15 mg by mouth at bedtime as needed for sleep.  04/08/16  Yes Curt Bears, MD  zolendronic acid (ZOMETA) 4 MG/5ML injection Inject 4 mg into the vein every 3 (three) months.    Yes Historical Provider, MD  polyethylene glycol (MIRALAX) packet Take 17 g by mouth daily as  needed for mild constipation. Patient not taking: Reported on 06/24/2016 06/13/16   Barton Dubois, MD    Current Facility-Administered Medications  Medication Dose Route Frequency Provider Last Rate Last Dose  . acetaminophen (TYLENOL) tablet 650 mg  650 mg Oral Q6H PRN Ivor Costa, MD   650 mg at 07/03/16 0534   Or  . acetaminophen (TYLENOL) suppository 650 mg  650 mg Rectal Q6H PRN Ivor Costa, MD   650 mg at 06/29/16 2346  . albuterol (PROVENTIL) (2.5 MG/3ML) 0.083% nebulizer solution 2.5 mg  2.5 mg Nebulization Q4H PRN Ivor Costa, MD      . cholecalciferol (VITAMIN D) tablet 1,000 Units  1,000 Units Oral QHS Ivor Costa, MD   1,000 Units at 07/07/16 2100  . desmopressin (DDAVP) tablet 0.1 mg  0.1 mg Oral QHS Barton Dubois, MD   0.1 mg at 07/07/16 2100  . hydrocortisone sodium succinate (SOLU-CORTEF) 100 MG injection 50 mg  50 mg Intravenous Q8H Oswald Hillock, MD      . insulin aspart (novoLOG) injection 0-15 Units  0-15 Units Subcutaneous TID WC Erick Colace, NP   2 Units at 07/08/16 [phone]  . iopamidol (ISOVUE-300) 61 % injection 30 mL  30 mL Oral Once PRN Dron Tanna Furry, MD      . MEDLINE mouth rinse  15 mL Mouth Rinse BID Barton Dubois, MD   15 mL at 07/08/16 1000  . midodrine  (PROAMATINE) tablet 15 mg  15 mg Oral TID WC Marshell Garfinkel, MD   Stopped at 07/08/16 1200  . ondansetron (ZOFRAN) injection 4 mg  4 mg Intravenous Q8H PRN Ivor Costa, MD   4 mg at 07/07/16 1906  . oxyCODONE-acetaminophen (PERCOCET/ROXICET) 5-325 MG per tablet 1 tablet  1 tablet Oral Q4H PRN Rush Farmer, MD   1 tablet at 07/08/16 1438  . pantoprazole (PROTONIX) EC tablet 40 mg  40 mg Oral Q1200 Erick Colace, NP   40 mg at 07/08/16 1443  . polyethylene glycol (MIRALAX / GLYCOLAX) packet 17 g  17 g Oral Daily Juanito Doom, MD   17 g at 07/08/16 0907  . rivaroxaban (XARELTO) tablet 20 mg  20 mg Oral Q supper Randa Spike, RPH   20 mg at 07/07/16 1712  . simethicone (MYLICON) chewable tablet 80 mg  80 mg Oral QID Oswald Hillock, MD   80 mg at 07/08/16 1412  . sodium chloride (OCEAN) 0.65 % nasal spray 1 spray  1 spray Each Nare PRN Anders Simmonds, MD   1 spray at 07/07/16 0015  . sodium chloride flush (NS) 0.9 % injection 10-40 mL  10-40 mL Intracatheter Q12H Dron Tanna Furry, MD   10 mL at 07/08/16 0907  . sodium chloride flush (NS) 0.9 % injection 10-40 mL  10-40 mL Intracatheter PRN Dron Tanna Furry, MD      . sodium chloride flush (NS) 0.9 % injection 3 mL  3 mL Intravenous Q12H Ivor Costa, MD   3 mL at 07/08/16 0907  . temazepam (RESTORIL) capsule 15 mg  15 mg Oral QHS PRN Jeryl Columbia, NP   15 mg at 07/07/16 2100    Allergies as of 06/24/2016 - Review Complete 06/24/2016  Allergen Reaction Noted  . Tramadol Palpitations 05/09/2016  . Amoxicillin  12/14/2012  . Penicillins Rash 11/09/2007    Family History  Problem Relation Age of Onset  . Dementia Mother   . Stroke Father     brainstem stroke  .  Hypertension Father   . Diabetes Father   . Prostate cancer Maternal Grandfather   . Depression Daughter   . Anesthesia problems Neg Hx     Social History   Social History  . Marital status: Married    Spouse name: N/A  . Number of children: 2  . Years of  education: N/A   Occupational History  . 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 Smoking status: Former Smoker    Packs/day: 3.00    Years: 10.00    Types: Cigarettes    Quit date: 07/11/1976  . Smokeless tobacco: Never Used     Comment: 3 PPD x 6 years  . Alcohol use 1.5 oz/week    3 Standard drinks or equivalent per week     Comment: 2 glasses of wine daily  . Drug use: No  . Sexual activity: Yes    Partners: Female   Other Topics Concern  . Not on file   Social History Narrative   Married.  2 daughters, both in [place], 4 granddaughters    Review of Systems: All systems reviewed and negative except where noted in HPI.  Physical Exam: Vital signs in last 24 hours: Temp:  [97.3 F (36.3 C)-99.5 F (37.5 C)] 98.1 F (36.7 C) (12/29 1300) Pulse Rate:  [88-100] 100 (12/29 0848) Resp:  [9-18] 18 (12/29 0848) BP: (88-119)/(63-93) 88/63 (12/29 1100) SpO2:  [92 %-99 %] 94 % (12/29 0848) Weight:  [200 lb 13.4 oz (91.1 kg)] 200 lb 13.4 oz (91.1 kg) (12/29 0500) Last BM Date: 07/05/16 General:   Alert, pale white male having intermittent waves of abdominal pain but o/w in NAD Head:  Normocephalic and atraumatic. Eyes:  Sclera clear, no icterus.   Conjunctiva pink. Ears:  Normal auditory acuity. Nose:  No deformity, discharge,  or lesions. Neck:  Supple; no masses or thyromegaly. Lungs:  Clear throughout to auscultation.   No wheezes, crackles, or rhonchi.  Heart:  Regular rate and rhythm; no murmurs. Abdomen:  Soft,  Mild-mod distended, diminished bowel sounds. He is diffusely tender.   Rectal:  Deferred  Msk:  Symmetrical without gross deformities. . Pulses:  Normal pulses noted. Extremities:  Without clubbing or edema. Neurologic:  Alert and  oriented x4;  grossly normal neurologically. Skin:  Intact without significant lesions or rashes.. Psych:  Alert and cooperative. Normal mood and affect.  Intake/Output from  previous day: 12/28 0701 - 12/29 0700 In: 240 [P.O.:240] Out: 1350 [Urine:1250; Emesis/NG output:100] Intake/Output this shift: No intake/output data recorded.  Lab Results:  Recent Labs  07/06/16 0500 07/07/16 0511 07/08/16 0545  WBC 6.3 8.2 10.7*  HGB 8.4* 9.4* 10.1*  HCT 25.5* 27.9* 30.8*  PLT 47* 50* 62*   BMET  Recent Labs  07/06/16 0500 07/07/16 0511 07/08/16 0545  NA 133* 139 145  K 4.0 3.9 3.6  CL 99* 103 107  CO2 '29 30 30  '$ GLUCOSE 100* 95 127*  BUN '15 15 17  '$ CREATININE 0.67 0.68 0.81  CALCIUM 7.4* 8.1* 8.7*    Studies/Results: Dg Chest Port 1 View  Result Date: 07/07/2016 CLINICAL DATA:  Respiratory failure EXAM: PORTABLE CHEST 1 VIEW COMPARISON:  July 06, 2016 FINDINGS: Port-A-Cath tip is at the cavoatrial junction. No pneumothorax. There is stable patchy airspace opacity in the right lower lung zone and left mid lung region. Lungs elsewhere clear. Heart is upper normal in size with pulmonary vascularity within normal limits. There is atherosclerotic  calcification aorta. No adenopathy evident. Bones appear somewhat osteoporotic. IMPRESSION: Patchy infiltrate in the right lower lung zone and left mid lung regions, stable. No pneumothorax. Stable cardiac silhouette. There is aortic atherosclerosis. Electronically Signed   By: Lowella Grip III M.D.   On: 07/07/2016 07:45    IMPRESSION / PLAN:   1. Metastatic NSCLC with brain mets. He is s/p stereotactic radiotherapy, now getting [name]. Treatment on hold for a few weeks due to recurrent admissions.   2. Recurrent distal small bowel obstruction. He has had transient periods of improvement over the last several weeks but no resolution. Having "intermittent" gas but vomited again this am and having intense waves of colicky pain. Last KUB was 2 days ago---> moderately dilated sb loops. We followed him several days during late November admission for same bowel obstruction at which time CT enterography  suggested obstruction was well above terminal ileum making it impossible to reach with a colonoscope. Unfortunately we don't have anything to offer patient.  -He is having significant pain. Tolerating liquids so will resume percocet. Will also order something IV in case he becomes unable to tolerate po -I called Surgery and asked them to reevaluate the patient.   3. GNR bacteremia - bacteroides Vulgatus (beta lactamase +). Suspected transient bacterial translocation, possible aspiration.  4. Acute DVTs in both LLE and RLE (while on Eliqus). He had IVC placed on 12/21  5. Anemia, recent drop in hgb from 9 to 7.2 in setting of bloody stool and CTscan suggesting colitis of ascending and proximal descending colon. Stools studies negative. Ischemia of concern, especially given transient hypotension though the areas affected make it unusual. Hgb spontaneously improved, up to 10.1 now.   5. Thrombocytopenia, (? recent HIT). Platelet stable at 62.   Tye Savoy NP  07/08/2016, 2:56 PM  Pager number [phone]

## 2016-07-08 NOTE — Progress Notes (Signed)
[facility]  PROGRESS NOTE  [name] EPP:[id number] DOB: Mar 13, 1941 DOA: 06/24/2016 PCP: [name], MD   Brief HPI:   75 year old with stage IV adenocarcinoma, arthritis, BPH, DVT. He has diagnosis of stage IVB(T1b, N2, M1c) non-small cell lung cancer, adenocarcinoma with metastases to the brain diagnosed in October 2017. He was undergoing chemotherapy with Prembrolizomab and recent dx of recurrent SBO, PE on eliquis who was admitted on 12/15 with severe abdominal pain. He was assessed by surgery who recommended conservative management with IVF and NG decompression.  On 06/29/16 he developed nausea and projectile vomiting, decreased blood pressure. Noted to have gram-negative rod bacteremia. He was transferred to stepdown for further management and PCCM consulted.  PCCM asked to see on 12/20 when he became progressively hypotensive. CT imaging of abd raising concern for ischemia vs infectious colitis. New acute LE DVT in-spite of NOAC, which we had to stop 12/21 d/t colitis related GIB and hgb drop of almost 2 gms. Now s/p IVC filter. Surgery following and recommends against surgery and as of 12/27 he was tolerating a diet.  Had some gas pain on 12/27, failed NG but feels OK 12/28 with normal bowel sounds and passing gas   Subjective   Patient complains of abdominal cramping, passing gas.   Assessment/Plan:     1. Small bowel obstruction-resolved, patient was seen by surgery, no surgical intervention as patient tolerated diet.  2. Colitis- seen on the CT abdomen ischemic versus infectious, patient was treated with Flagyl from  12/21 to 12/26. Ischemic colitis is suspected in setting of small bowel obstruction. As patient complains of cramping he was started on simethicone when necessary, will make simethicone 80 mg by mouth 4 times a day scheduled. 3. Non-small cell lung cancer- patient on chemotherapy, followed by oncology as outpatient. 4. History of PE/DVT- patient was on  eliquis, he developed new DVTs on NOAC, status post IVC filter placement. Now on Xarelto. 5. Bacteroides Vulgatus  bacteremia (beta lactamase +)- patient treated with antibiotics, currently off antibiotics. Repeat blood cultures on 06/29/2016 negative. 6. Diabetes insipidus- continue DDAVP, will cut down the dose of Solucortef to 50 mg po q 8 hr. 7. Diabetes mellitus- start sliding scale insulin with Novolog. 8. Hypotension- continue Midodrine 15 mg po tid.     DVT prophylaxis: Anti-coagulation with Xarelto  Code Status: Full code  Family Communication: No family present at bedside   Disposition Plan: To be decided   Consultants:  General surgery  PCCM  Procedures: Venous US 12/21>>>Findings consistent with acute deep vein thrombosis involvingperoneal, popliteal, and distal femoral arteryof the right lowerextremity. - Findings consistent with acute deep vein thrombosis involving the popliteal and gastrocnemius veins of the left lower extremity. - Findings consistent with chronic deep vein thrombosis involving them proximal posterior tibial vein of the right lower extremity. - No evidence of a superficial thrombosis of the right or left lower extremity. CT abd/pelvis 12/20: Persistently dilated fluid and gas filled small bowel loops to level of the right lower quadrant/ distal ilium with abrupt transition without mass identified and therefore may be related to stricture/adhesion.Interval development of abnormal thickening of the ascending colon and proximal descending colon with surrounding inflammation of fat planes suggesting colitis whether inflammatory infectious or ischemic.  CULTURES: BCX2 12/15: bacteroides vulgatus BCX2 12/20>>> negative      Antibiotics:   Anti-infectives    Start     Dose/Rate Route Frequency Ordered Stop   06/30/16 0830  metroNIDAZOLE (FLAGYL) IVPB 500 mg  Status:  Discontinued     500 mg 100 mL/hr over 60 Minutes Intravenous Every  8 hours 06/30/16 0735 07/05/16 1045   06/29/16 1800  meropenem (MERREM) 1 g in sodium chloride 0.9 % 100 mL IVPB  Status:  Discontinued     1 g 200 mL/hr over 30 Minutes Intravenous Every 8 hours 06/29/16 1552 07/07/16 1126   06/29/16 1430  levofloxacin (LEVAQUIN) IVPB 750 mg  Status:  Discontinued     750 mg 100 mL/hr over 90 Minutes Intravenous Every 24 hours 06/29/16 1355 06/29/16 1543   06/25/16 1000  levofloxacin (LEVAQUIN) tablet 500 mg  Status:  Discontinued     500 mg Oral Daily 06/24/16 2026 06/25/16 0941       Objective   Vitals:   07/08/16 0530 07/08/16 0848 07/08/16 1100 07/08/16 1300  BP: 95/81 119/85 (!) 88/63   Pulse: 98 100    Resp: 16 18    Temp:  98 F (36.7 C)  98.1 F (36.7 C)  TempSrc:  Oral  Oral  SpO2: 92% 94%    Weight:      Height:        Intake/Output Summary (Last 24 hours) at 07/08/16 1419 Last data filed at 07/08/16 0100  Gross per 24 hour  Intake              240 ml  Output             1350 ml  Net            -1110 ml   Filed Weights   07/05/16 0500 07/07/16 0500 07/08/16 0500  Weight: 91.5 kg (201 lb 11.5 oz) 95.8 kg (211 lb 3.2 oz) 91.1 kg (200 lb 13.4 oz)     Physical Examination:  General exam: Appears calm and comfortable. Respiratory system: Clear to auscultation. Respiratory effort normal. Cardiovascular system:  RRR. No  murmurs, rubs, gallops. No pedal edema. GI system: Abdomen is nondistended, Mild tenderness to palpation in the left lower quadrant. No organomegaly.  Central nervous system. No focal neurological deficits. 5 x 5 power in all extremities. Skin: No rashes, lesions or ulcers. Psychiatry: Alert, oriented x 3.Judgement and insight appear normal. Affect normal.    Data Reviewed: I have personally reviewed following labs and imaging studies  CBG:  Recent Labs Lab 07/07/16 0726 07/07/16 1211 07/07/16 1549 07/07/16 2106 07/08/16 0846  GLUCAP 102* 123* 94 140* 125*    CBC:  Recent Labs Lab  07/04/16 0522 07/05/16 0534 07/06/16 0500 07/07/16 0511 07/08/16 0545  WBC 7.2 4.5 6.3 8.2 10.7*  NEUTROABS  --   --   --   --  9.9*  HGB 8.7* 8.2* 8.4* 9.4* 10.1*  HCT 25.6* 23.9* 25.5* 27.9* 30.8*  MCV 95.2 95.6 95.1 97.6 99.7  PLT 57* 44* 47* 50* 62*    Basic Metabolic Panel:  Recent Labs Lab 07/03/16 0517 07/04/16 0522 07/05/16 0534 07/06/16 0500 07/07/16 0511 07/08/16 0545  NA 134* 139 137 133* 139 145  K 3.3* 2.9* 3.1* 4.0 3.9 3.6  CL 102 104 100* 99* 103 107  CO2 '27 27 30 29 30 30  '$ GLUCOSE 142* 170* 123* 100* 95 127*  BUN '11 14 12 15 15 17  '$ CREATININE 0.53* 0.72 0.61 0.67 0.68 0.81  CALCIUM 7.0* 7.5* 7.1* 7.4* 8.1* 8.7*  MG 1.6* 1.7 1.9 1.8  --   --   PHOS 1.4* 1.7* 2.5 2.5  --   --     Recent  Results (from the past 240 hour(s))  Culture, blood (Routine X 2) w Reflex to ID Panel     Status: None   Collection Time: 06/29/16  3:05 PM  Result Value Ref Range Status   Specimen Description BLOOD LEFT ANTECUBITAL  Final   Special Requests IN PEDIATRIC BOTTLE 3CC  Final   Culture   Final    NO GROWTH 5 DAYS Performed at [facility]    Report Status 07/04/2016 FINAL  Final  Culture, blood (Routine X 2) w Reflex to ID Panel     Status: None   Collection Time: 06/29/16  3:10 PM  Result Value Ref Range Status   Specimen Description BLOOD RIGHT ANTECUBITAL  Final   Special Requests IN PEDIATRIC BOTTLE 4CC  Final   Culture   Final    NO GROWTH 5 DAYS Performed at [facility]    Report Status 07/04/2016 FINAL  Final      Cardiac Enzymes: No results for input(s): CKTOTAL, CKMB, CKMBINDEX, TROPONINI in the last 168 hours. BNP (last 3 results)  Recent Labs  06/05/16 2354 06/19/16 1210 06/24/16 2151  BNP 45.8 33.6 94.7    ProBNP (last 3 results) No results for input(s): PROBNP in the last 8760 hours.    Studies: Dg Chest Port 1 View  Result Date: 07/07/2016 CLINICAL DATA:  Respiratory failure EXAM: PORTABLE CHEST 1 VIEW COMPARISON:   July 06, 2016 FINDINGS: Port-A-Cath tip is at the cavoatrial junction. No pneumothorax. There is stable patchy airspace opacity in the right lower lung zone and left mid lung region. Lungs elsewhere clear. Heart is upper normal in size with pulmonary vascularity within normal limits. There is atherosclerotic calcification aorta. No adenopathy evident. Bones appear somewhat osteoporotic. IMPRESSION: Patchy infiltrate in the right lower lung zone and left mid lung regions, stable. No pneumothorax. Stable cardiac silhouette. There is aortic atherosclerosis. Electronically Signed   By: Lowella Grip III M.D.   On: 07/07/2016 07:45    Scheduled Meds: . cholecalciferol  1,000 Units Oral QHS  . desmopressin  0.1 mg Oral QHS  . hydrocortisone sod succinate (SOLU-CORTEF) inj  100 mg Intravenous Q8H  . insulin aspart  0-15 Units Subcutaneous TID WC  . mouth rinse  15 mL Mouth Rinse BID  . midodrine  15 mg Oral TID WC  . pantoprazole  40 mg Oral Q1200  . polyethylene glycol  17 g Oral Daily  . rivaroxaban  20 mg Oral Q supper  . simethicone  80 mg Oral QID  . sodium chloride flush  10-40 mL Intracatheter Q12H  . sodium chloride flush  3 mL Intravenous Q12H      Time spent: 25 min  [facility] Pager [phone]. If 7PM-7AM, please contact night-coverage at [email or web address], Office  [phone]  password [email or web address] 07/08/2016, 2:19 PM  LOS: 14 days

## 2016-07-08 NOTE — Progress Notes (Signed)
OT Cancellation Note  Patient Details Name: [name] MRN: [id number] DOB: 09-23-1940   Cancelled Treatment:    Reason Eval/Treat Not Completed: Other (comment).  Pt moved up to 3rd floor.  Spoke to [employer].  Pt had a lot of pain and is now resting comfortably. Will check back next week.  Bridgitte Felicetti 07/08/2016, 3:42 PM  Lesle Chris, OTR/L [phone] 07/08/2016

## 2016-07-08 NOTE — Progress Notes (Signed)
Full report given to Josph Macho, RN on [facility]. Pt stable for transport via bed.

## 2016-07-09 LAB — CBC
HCT: 26.1 % — ABNORMAL LOW (ref 39.0–52.0)
Hemoglobin: 8.4 g/dL — ABNORMAL LOW (ref 13.0–17.0)
MCH: 32.6 pg (ref 26.0–34.0)
MCHC: 32.2 g/dL (ref 30.0–36.0)
MCV: 101.2 fL — AB (ref 78.0–100.0)
PLATELETS: 51 10*3/uL — AB (ref 150–400)
RBC: 2.58 MIL/uL — ABNORMAL LOW (ref 4.22–5.81)
RDW: 20.3 % — AB (ref 11.5–15.5)
WBC: 7.8 10*3/uL (ref 4.0–10.5)

## 2016-07-09 LAB — COMPREHENSIVE METABOLIC PANEL
ALBUMIN: 2 g/dL — AB (ref 3.5–5.0)
ALT: 16 U/L — ABNORMAL LOW (ref 17–63)
ANION GAP: 9 (ref 5–15)
AST: 13 U/L — ABNORMAL LOW (ref 15–41)
Alkaline Phosphatase: 67 U/L (ref 38–126)
BUN: 22 mg/dL — ABNORMAL HIGH (ref 6–20)
CO2: 27 mmol/L (ref 22–32)
Calcium: 7.5 mg/dL — ABNORMAL LOW (ref 8.9–10.3)
Chloride: 107 mmol/L (ref 101–111)
Creatinine, Ser: 0.56 mg/dL — ABNORMAL LOW (ref 0.61–1.24)
GFR calc non Af Amer: >60 mL/min (ref >60)
GLUCOSE: 101 mg/dL — AB (ref 65–99)
POTASSIUM: 3.3 mmol/L — AB (ref 3.5–5.1)
SODIUM: 143 mmol/L (ref 135–145)
TOTAL PROTEIN: 4.3 g/dL — AB (ref 6.5–8.1)
Total Bilirubin: 1 mg/dL (ref 0.3–1.2)

## 2016-07-09 LAB — GLUCOSE, CAPILLARY
GLUCOSE-CAPILLARY: 95 mg/dL (ref 65–99)
GLUCOSE-CAPILLARY: 104 mg/dL — AB (ref 65–99)
Glucose-Capillary: 101 mg/dL — ABNORMAL HIGH (ref 65–99)
Glucose-Capillary: 97 mg/dL (ref 65–99)
Glucose-Capillary: 113 mg/dL — ABNORMAL HIGH (ref 65–99)

## 2016-07-09 LAB — PHOSPHORUS: PHOSPHORUS: 2.7 mg/dL (ref 2.5–4.6)

## 2016-07-09 LAB — MAGNESIUM: Magnesium: 2.3 mg/dL (ref 1.7–2.4)

## 2016-07-09 MED ORDER — MORPHINE SULFATE (PF) 4 MG/ML IV SOLN
4.0000 mg | INTRAVENOUS | Status: DC | PRN
  Administered 2016-07-09 – 2016-07-12 (×9): 4 mg via INTRAVENOUS
  Filled 2016-07-09 (×10): qty 1

## 2016-07-09 MED ORDER — SODIUM CHLORIDE 0.9 % IV SOLN: INTRAVENOUS | Status: DC

## 2016-07-09 MED ORDER — POTASSIUM CHLORIDE IN NACL 20-0.9 MEQ/L-% IV SOLN
INTRAVENOUS | Status: DC
  Administered 2016-07-09: 18:00:00 via INTRAVENOUS
  Filled 2016-07-09 (×3): qty 1000

## 2016-07-09 MED ORDER — POTASSIUM CHLORIDE 10 MEQ/100ML IV SOLN
10.0000 meq | INTRAVENOUS | Status: AC
  Administered 2016-07-09 (×2): 10 meq via INTRAVENOUS
  Filled 2016-07-09 (×2): qty 100

## 2016-07-09 MED ORDER — FAT EMULSION 20 % IV EMUL
240.0000 mL | INTRAVENOUS | Status: AC
  Administered 2016-07-09: 240 mL via INTRAVENOUS
  Filled 2016-07-09: qty 250

## 2016-07-09 MED ORDER — TRACE MINERALS CR-CU-MN-SE-ZN 10-1000-500-60 MCG/ML IV SOLN
INTRAVENOUS | Status: AC
  Administered 2016-07-09: 19:00:00 via INTRAVENOUS
  Filled 2016-07-09: qty 960

## 2016-07-09 MED ORDER — INSULIN ASPART 100 UNIT/ML ~~LOC~~ SOLN
0.0000 [IU] | SUBCUTANEOUS | Status: DC
  Administered 2016-07-10 (×5): 1 [IU] via SUBCUTANEOUS
  Administered 2016-07-10: 2 [IU] via SUBCUTANEOUS
  Administered 2016-07-11 (×2): 1 [IU] via SUBCUTANEOUS
  Administered 2016-07-11: 2 [IU] via SUBCUTANEOUS
  Administered 2016-07-11: 1 [IU] via SUBCUTANEOUS
  Administered 2016-07-11: 3 [IU] via SUBCUTANEOUS
  Administered 2016-07-12: 2 [IU] via SUBCUTANEOUS
  Administered 2016-07-12: 3 [IU] via SUBCUTANEOUS
  Administered 2016-07-12: 2 [IU] via SUBCUTANEOUS

## 2016-07-09 NOTE — Progress Notes (Signed)
Nursing Note: Pt had medium,brown,liquid stool w/ few small semi-solid pieces of stool.wbb

## 2016-07-09 NOTE — Progress Notes (Signed)
Nursing Note: Pt had massive ,brown,foul smelling liquid stool form head to toe.Pt,bed,pillows cleaned.Pt says he" feel better".wbb

## 2016-07-09 NOTE — Progress Notes (Signed)
Nursing Note: Pt had huge emesis of bright yellow emesis.Pt and bed changed.wbb

## 2016-07-09 NOTE — Progress Notes (Signed)
[facility]  PROGRESS NOTE  AXYL SITZMAN RWE:[id number] DOB: 1940-10-04 DOA: 06/24/2016 PCP: [name], MD   Brief HPI:   75 year old with stage IV adenocarcinoma, arthritis, BPH, DVT. He has diagnosis of stage IVB(T1b, N2, M1c) non-small cell lung cancer, adenocarcinoma with metastases to the brain diagnosed in October 2017. He was undergoing chemotherapy with Prembrolizomab and recent dx of recurrent SBO, PE on eliquis who was admitted on 12/15 with severe abdominal pain. He was assessed by surgery who recommended conservative management with IVF and NG decompression.  On 06/29/16 he developed nausea and projectile vomiting, decreased blood pressure. Noted to have gram-negative rod bacteremia. He was transferred to stepdown for further management and PCCM consulted.  PCCM asked to see on 12/20 when he became progressively hypotensive. CT imaging of abd raising concern for ischemia vs infectious colitis. New acute LE DVT in-spite of NOAC, which we had to stop 12/21 d/t colitis related GIB and hgb drop of almost 2 gms. Now s/p IVC filter. Surgery following and recommends against surgery and as of 12/27 he was tolerating a diet.  Had some gas pain on 12/27, failed NG but feels OK 12/28 with normal bowel sounds and passing gas   Subjective   Patient Yesterday again started having vomiting, general surgery was consulted and patient again kept nothing by mouth for possible surgical intervention. This morning patient denies any pain. Says he had large bowel movement last night.   Assessment/Plan:     1. Small bowel obstruction-Recurrent patient has again been seen by surgery and plan for surgical intervention Monday or Tuesday. In the meantime TNA has been started. 2. Colitis- seen on the CT abdomen ischemic versus infectious, patient was treated with Flagyl from  12/21 to 12/26. Ischemic colitis is suspected in setting of small bowel obstruction.  3. Non-small cell lung cancer- patient  on chemotherapy, followed by oncology as outpatient. 4. History of PE/DVT- patient was on eliquis, he developed new DVTs on NOAC, status post IVC filter placement. Patient was started on Xarelto which is currently on hold for surgery on Monday or Tuesday. 5. Bacteroides Vulgatus  bacteremia (beta lactamase +)- patient treated with antibiotics, currently off antibiotics. Repeat blood cultures on 06/29/2016 negative. 6. Diabetes insipidus- continue DDAVP, will cut down the dose of Solucortef to 50 mg po q 8 hr. 7. Diabetes mellitus- start sliding scale insulin with Novolog. 8. Hypotension- continue Midodrine 15 mg po tid.     DVT prophylaxis: Anti-coagulation with Xarelto  Code Status: Full code  Family Communication: No family present at bedside   Disposition Plan: To be decided, based on patient's condition after surgery next week.   Consultants:  General surgery  PCCM  Procedures: Venous US 12/21>>>Findings consistent with acute deep vein thrombosis involvingperoneal, popliteal, and distal femoral arteryof the right lowerextremity. - Findings consistent with acute deep vein thrombosis involving the popliteal and gastrocnemius veins of the left lower extremity. - Findings consistent with chronic deep vein thrombosis involving them proximal posterior tibial vein of the right lower extremity. - No evidence of a superficial thrombosis of the right or left lower extremity. CT abd/pelvis 12/20: Persistently dilated fluid and gas filled small bowel loops to level of the right lower quadrant/ distal ilium with abrupt transition without mass identified and therefore may be related to stricture/adhesion.Interval development of abnormal thickening of the ascending colon and proximal descending colon with surrounding inflammation of fat planes suggesting colitis whether inflammatory infectious or ischemic.  CULTURES: BCX2 12/15: bacteroides vulgatus BCX2  12/20>>> negative  .  sodium chloride 1,000 mL (07/09/16 0712)  . 0.9 % NaCl with KCl 20 mEq / L    . TPN (CLINIMIX) Adult without lytes     And  . fat emulsion        Antibiotics:   Anti-infectives    Start     Dose/Rate Route Frequency Ordered Stop   06/30/16 0830  metroNIDAZOLE (FLAGYL) IVPB 500 mg  Status:  Discontinued     500 mg 100 mL/hr over 60 Minutes Intravenous Every 8 hours 06/30/16 0735 07/05/16 1045   06/29/16 1800  meropenem (MERREM) 1 g in sodium chloride 0.9 % 100 mL IVPB  Status:  Discontinued     1 g 200 mL/hr over 30 Minutes Intravenous Every 8 hours 06/29/16 1552 07/07/16 1126   06/29/16 1430  levofloxacin (LEVAQUIN) IVPB 750 mg  Status:  Discontinued     750 mg 100 mL/hr over 90 Minutes Intravenous Every 24 hours 06/29/16 1355 06/29/16 1543   06/25/16 1000  levofloxacin (LEVAQUIN) tablet 500 mg  Status:  Discontinued     500 mg Oral Daily 06/24/16 2026 06/25/16 0941       Objective   Vitals:   07/08/16 2035 07/09/16 0422 07/09/16 0611 07/09/16 1406  BP: 105/77 119/76  (!) 85/73  Pulse: (!) 105 79  (!) 107  Resp: '18 16  16  '$ Temp: 98.1 F (36.7 C) 98 F (36.7 C)  98.2 F (36.8 C)  TempSrc: Oral Oral  Axillary  SpO2: 96% 97% 93% 96%  Weight:   90.6 kg (199 lb 11.8 oz)   Height:        Intake/Output Summary (Last 24 hours) at 07/09/16 1601 Last data filed at 07/09/16 0700  Gross per 24 hour  Intake             1200 ml  Output              500 ml  Net              700 ml   Filed Weights   07/07/16 0500 07/08/16 0500 07/09/16 0611  Weight: 95.8 kg (211 lb 3.2 oz) 91.1 kg (200 lb 13.4 oz) 90.6 kg (199 lb 11.8 oz)     Physical Examination:  General exam: Appears calm and comfortable. Respiratory system: Clear to auscultation. Respiratory effort normal. Cardiovascular system:  RRR. No  murmurs, rubs, gallops. No pedal edema. GI system: Abdomen is nondistended, Mild tenderness to palpation in the left lower quadrant. No organomegaly.  Central nervous system. No  focal neurological deficits. 5 x 5 power in all extremities. Skin: No rashes, lesions or ulcers. Psychiatry: Alert, oriented x 3.Judgement and insight appear normal. Affect normal.    Data Reviewed: I have personally reviewed following labs and imaging studies  CBG:  Recent Labs Lab 07/08/16 2033 07/08/16 2336 07/09/16 0400 07/09/16 0812 07/09/16 1219  GLUCAP 111* 100* 101* 97 95    CBC:  Recent Labs Lab 07/05/16 0534 07/06/16 0500 07/07/16 0511 07/08/16 0545 07/09/16 1220  WBC 4.5 6.3 8.2 10.7* 7.8  NEUTROABS  --   --   --  9.9*  --   HGB 8.2* 8.4* 9.4* 10.1* 8.4*  HCT 23.9* 25.5* 27.9* 30.8* 26.1*  MCV 95.6 95.1 97.6 99.7 101.2*  PLT 44* 47* 50* 62* 51*    Basic Metabolic Panel:  Recent Labs Lab 07/03/16 0517 07/04/16 0522 07/05/16 0534 07/06/16 0500 07/07/16 0511 07/08/16 0545 07/09/16 1220  NA 134* 139  137 133* 139 145 143  K 3.3* 2.9* 3.1* 4.0 3.9 3.6 3.3*  CL 102 104 100* 99* 103 107 107  CO2 '27 27 30 29 30 30 27  '$ GLUCOSE 142* 170* 123* 100* 95 127* 101*  BUN '11 14 12 15 15 17 '$ 22*  CREATININE 0.53* 0.72 0.61 0.67 0.68 0.81 0.56*  CALCIUM 7.0* 7.5* 7.1* 7.4* 8.1* 8.7* 7.5*  MG 1.6* 1.7 1.9 1.8  --   --  2.3  PHOS 1.4* 1.7* 2.5 2.5  --   --  2.7    No results found for this or any previous visit (from the past 240 hour(s)).    Cardiac Enzymes: No results for input(s): CKTOTAL, CKMB, CKMBINDEX, TROPONINI in the last 168 hours. BNP (last 3 results)  Recent Labs  06/05/16 2354 06/19/16 1210 06/24/16 2151  BNP 45.8 33.6 94.7    ProBNP (last 3 results) No results for input(s): PROBNP in the last 8760 hours.    Studies: Dg Abd 2 Views  Result Date: 07/08/2016 CLINICAL DATA:  Abdominal pain, nausea, vomiting. EXAM: ABDOMEN - 2 VIEW COMPARISON:  None. FINDINGS: Dilated small bowel loops are again noted consistent with distal small bowel obstruction. IVC filter is noted. Phleboliths are noted in the pelvis. No free air is noted suggest  pneumoperitoneum. IMPRESSION: Stable small bowel dilatation is noted consistent with distal small bowel obstruction. Electronically Signed   By: Marijo Conception, M.D.   On: 07/08/2016 18:30    Scheduled Meds: . cholecalciferol  1,000 Units Oral QHS  . desmopressin  0.1 mg Oral QHS  . hydrocortisone sod succinate (SOLU-CORTEF) inj  50 mg Intravenous Q8H  . insulin aspart  0-9 Units Subcutaneous Q4H  . mouth rinse  15 mL Mouth Rinse BID  . midodrine  15 mg Oral TID WC  . pantoprazole  40 mg Oral Q1200  . polyethylene glycol  17 g Oral Daily  . potassium chloride  10 mEq Intravenous Q1 Hr x 2  . simethicone  80 mg Oral QID  . sodium chloride flush  10-40 mL Intracatheter Q12H  . sodium chloride flush  3 mL Intravenous Q12H      Time spent: 25 min  [facility] Pager [phone]. If 7PM-7AM, please contact night-coverage at [email or web address], Office  [phone]  password [email or web address] 07/09/2016, 4:01 PM  LOS: 15 days

## 2016-07-09 NOTE — Progress Notes (Signed)
Physical Therapy Treatment Patient Details Name: [name] MRN: [id number] DOB: May 19, 1941 Today's Date: 07/09/2016    History of Present Illness [name] is a 75 y.o. male with medical history significant of recently diagnosed stage IV NSCLC (80% PDL1+) being treated with pembrolizomab [name], immunotherapy), recent SBO (thought to be due to adhesions), DVT, multifocal PE (with possible HIT) on Eliquis, Gerd, multiple myeloma with skull lesion (s/p of radiation therapy 2016), chronic prostatitis, BPH, dCHF, diabetes insipidus on DDAVP, who presents with shortness of breath abdominal pain.    PT Comments    Pt motivated to mobilize and progressed to ambulating short distance with RW.  Pt continues ltd by orthostatic changes in BP but denies dizziness throughout session.  BP supine 113/75; sit 95/68; sit x 2 min 103/61, stand 78/59 and after walking 10' and sitting down 91/63.  Follow Up Recommendations  CIR     Equipment Recommendations  None recommended by PT    Recommendations for Other Services OT consult     Precautions / Restrictions Precautions Precautions: Fall Precaution Comments: monitor BP, hypotensive Restrictions Weight Bearing Restrictions: No    Mobility  Bed Mobility Overal bed mobility: Needs Assistance Bed Mobility: Supine to Sit Rolling: Supervision         General bed mobility comments: Pt to EOB unassisted  Transfers Overall transfer level: Needs assistance Equipment used: Rolling walker (2 wheeled) Transfers: Sit to/from Stand Sit to Stand: Min assist;+2 safety/equipment         General transfer comment: cues for transition position and use of UEs to self assist  Ambulation/Gait Ambulation/Gait assistance: Min assist;+2 safety/equipment Ambulation Distance (Feet): 10 Feet Assistive device: Rolling walker (2 wheeled) Gait Pattern/deviations: Step-through pattern;Decreased step length - right;Decreased step length -  left;Shuffle;Trunk flexed Gait velocity: decr Gait velocity interpretation: Below normal speed for age/gender General Gait Details: cues for posture and position from RW.  Distance ltd by BP changes   Stairs            Wheelchair Mobility    Modified Rankin (Stroke Patients Only)       Balance Overall balance assessment: Needs assistance Sitting-balance support: No upper extremity supported;Feet supported Sitting balance-Leahy Scale: Good     Standing balance support: Bilateral upper extremity supported Standing balance-Leahy Scale: Poor                      Cognition Arousal/Alertness: Awake/alert Behavior During Therapy: WFL for tasks assessed/performed Overall Cognitive Status: Within Functional Limits for tasks assessed                      Exercises      General Comments        Pertinent Vitals/Pain Pain Assessment: Faces Faces Pain Scale: Hurts a little bit Pain Intervention(s): Limited activity within patient's tolerance;Monitored during session;Premedicated before session    Home Living                      Prior Function            PT Goals (current goals can now be found in the care plan section) Acute Rehab PT Goals PT Goal Formulation: With patient Time For Goal Achievement: 07/13/16 Potential to Achieve Goals: Fair Progress towards PT goals: Progressing toward goals    Frequency    Min 3X/week      PT Plan Current plan remains appropriate    Co-evaluation  End of Session Equipment Utilized During Treatment: Gait belt Activity Tolerance: Patient limited by fatigue Patient left: in chair;with call bell/phone within reach     Time: 1234-1300 PT Time Calculation (min) (ACUTE ONLY): 26 min  Charges:  $Gait Training: 8-22 mins $Therapeutic Activity: 8-22 mins                    G Codes:      Dia Donate 2016-08-02, 1:49 PM

## 2016-07-09 NOTE — Progress Notes (Signed)
Patient ID: [name], male   DOB: 1940/10/20, 75 y.o.   MRN: [id number] [facility] Surgery Progress Note:   * No surgery found *  Subjective: Mental status is confused at times related to disease.  He was able to chronicle his disease.  He did have a good BM last night Objective: Vital signs in last 24 hours: Temp:  [97.5 F (36.4 C)-98.1 F (36.7 C)] 98 F (36.7 C) (12/30 0422) Pulse Rate:  [79-105] 79 (12/30 0422) Resp:  [16-18] 16 (12/30 0422) BP: (88-119)/(63-85) 119/76 (12/30 0422) SpO2:  [93 %-97 %] 93 % (12/30 0611) Weight:  [90.6 kg (199 lb 11.8 oz)] 90.6 kg (199 lb 11.8 oz) (12/30 1941)  Intake/Output from previous day: 12/29 0701 - 12/30 0700 In: 1200 [I.V.:1200] Out: 500 [Urine:500] Intake/Output this shift: No intake/output data recorded.  Physical Exam: Work of breathing is not labored.  Abdomen is still mildly distended and sore  Lab Results:  Results for orders placed or performed during the hospital encounter of 06/24/16 (from the past 48 hour(s))  Glucose, capillary     Status: Abnormal   Collection Time: 07/07/16 12:11 PM  Result Value Ref Range   Glucose-Capillary 123 (H) 65 - 99 mg/dL  Glucose, capillary     Status: None   Collection Time: 07/07/16  3:49 PM  Result Value Ref Range   Glucose-Capillary 94 65 - 99 mg/dL  Glucose, capillary     Status: Abnormal   Collection Time: 07/07/16  9:06 PM  Result Value Ref Range   Glucose-Capillary 140 (H) 65 - 99 mg/dL  Basic metabolic panel     Status: Abnormal   Collection Time: 07/08/16  5:45 AM  Result Value Ref Range   Sodium 145 135 - 145 mmol/L   Potassium 3.6 3.5 - 5.1 mmol/L   Chloride 107 101 - 111 mmol/L   CO2 30 22 - 32 mmol/L   Glucose, Bld 127 (H) 65 - 99 mg/dL   BUN 17 6 - 20 mg/dL   Creatinine, Ser 0.81 0.61 - 1.24 mg/dL   Calcium 8.7 (L) 8.9 - 10.3 mg/dL   GFR calc non Af Amer >60 >60 mL/min   GFR calc Af Amer >60 >60 mL/min    Comment: (NOTE) The eGFR has been calculated using  the CKD EPI equation. This calculation has not been validated in all clinical situations. eGFR's persistently <60 mL/min signify possible Chronic Kidney Disease.    Anion gap 8 5 - 15  CBC with Differential/Platelet     Status: Abnormal   Collection Time: 07/08/16  5:45 AM  Result Value Ref Range   WBC 10.7 (H) 4.0 - 10.5 K/uL   RBC 3.09 (L) 4.22 - 5.81 MIL/uL   Hemoglobin 10.1 (L) 13.0 - 17.0 g/dL   HCT 30.8 (L) 39.0 - 52.0 %   MCV 99.7 78.0 - 100.0 fL   MCH 32.7 26.0 - 34.0 pg   MCHC 32.8 30.0 - 36.0 g/dL   RDW 20.1 (H) 11.5 - 15.5 %   Platelets 62 (L) 150 - 400 K/uL    Comment: CONSISTENT WITH PREVIOUS RESULT   Neutrophils Relative % 92 %   Neutro Abs 9.9 (H) 1.7 - 7.7 K/uL   Lymphocytes Relative 4 %   Lymphs Abs 0.4 (L) 0.7 - 4.0 K/uL   Monocytes Relative 4 %   Monocytes Absolute 0.5 0.1 - 1.0 K/uL   Eosinophils Relative 0 %   Eosinophils Absolute 0.0 0.0 - 0.7 K/uL  Basophils Relative 0 %   Basophils Absolute 0.0 0.0 - 0.1 K/uL  Glucose, capillary     Status: Abnormal   Collection Time: 07/08/16  8:46 AM  Result Value Ref Range   Glucose-Capillary 125 (H) 65 - 99 mg/dL   Comment 1 Notify RN    Comment 2 Document in Chart   Glucose, capillary     Status: Abnormal   Collection Time: 07/08/16  6:45 PM  Result Value Ref Range   Glucose-Capillary 140 (H) 65 - 99 mg/dL   Comment 1 Notify RN    Comment 2 Document in Chart   Glucose, capillary     Status: Abnormal   Collection Time: 07/08/16  8:33 PM  Result Value Ref Range   Glucose-Capillary 111 (H) 65 - 99 mg/dL  Glucose, capillary     Status: Abnormal   Collection Time: 07/08/16 11:36 PM  Result Value Ref Range   Glucose-Capillary 100 (H) 65 - 99 mg/dL  Glucose, capillary     Status: Abnormal   Collection Time: 07/09/16  4:00 AM  Result Value Ref Range   Glucose-Capillary 101 (H) 65 - 99 mg/dL  Glucose, capillary     Status: None   Collection Time: 07/09/16  8:12 AM  Result Value Ref Range   Glucose-Capillary  97 65 - 99 mg/dL   *Note: Due to a large number of results and/or encounters for the requested time period, some results have not been displayed. A complete set of results can be found in Results Review.    Radiology/Results: Dg Abd 2 Views  Result Date: 07/08/2016 CLINICAL DATA:  Abdominal pain, nausea, vomiting. EXAM: ABDOMEN - 2 VIEW COMPARISON:  None. FINDINGS: Dilated small bowel loops are again noted consistent with distal small bowel obstruction. IVC filter is noted. Phleboliths are noted in the pelvis. No free air is noted suggest pneumoperitoneum. IMPRESSION: Stable small bowel dilatation is noted consistent with distal small bowel obstruction. Electronically Signed   By: Marijo Conception, M.D.   On: 07/08/2016 18:30    Anti-infectives: Anti-infectives    Start     Dose/Rate Route Frequency Ordered Stop   06/30/16 0830  metroNIDAZOLE (FLAGYL) IVPB 500 mg  Status:  Discontinued     500 mg 100 mL/hr over 60 Minutes Intravenous Every 8 hours 06/30/16 0735 07/05/16 1045   06/29/16 1800  meropenem (MERREM) 1 g in sodium chloride 0.9 % 100 mL IVPB  Status:  Discontinued     1 g 200 mL/hr over 30 Minutes Intravenous Every 8 hours 06/29/16 1552 07/07/16 1126   06/29/16 1430  levofloxacin (LEVAQUIN) IVPB 750 mg  Status:  Discontinued     750 mg 100 mL/hr over 90 Minutes Intravenous Every 24 hours 06/29/16 1355 06/29/16 1543   06/25/16 1000  levofloxacin (LEVAQUIN) tablet 500 mg  Status:  Discontinued     500 mg Oral Daily 06/24/16 2026 06/25/16 0941      Assessment/Plan: Problem List: Patient Active Problem List   Diagnosis Date Noted  . Generalized abdominal pain   . Sepsis associated hypotension ([place])   . Benign prostatic hyperplasia without lower urinary tract symptoms   . GERD (gastroesophageal reflux disease) 06/24/2016  . Chronic diastolic CHF (congestive heart failure) ([place]) 06/24/2016  . GIB (gastrointestinal bleeding) 06/24/2016  . Malnutrition of moderate degree  06/20/2016  . Malignant neoplasm of upper lobe of right lung ([place])   . Pulmonary emboli ([place]) 06/19/2016  . Pulmonary embolus ([place]) 06/19/2016  . Abnormal CT scan, small  bowel   . Encephalopathy, metabolic   . Thrombocytopenia ([place])   . Diabetes insipidus ([place])   . Hypernatremia   . SBO (small bowel obstruction) 06/06/2016  . Hypokalemia 06/06/2016  . Adenocarcinoma of right lung, stage 4 ([place]) 05/27/2016  . Nausea with vomiting 05/18/2016  . Dehydration 05/18/2016  . Brain metastases ([place]) 05/04/2016  . Mediastinal adenopathy 05/04/2016  . Peripheral neuropathy due to chemotherapy ([place]) 02/08/2016  . Long term current use of anticoagulant therapy 11/04/2014  . Peripheral edema 11/04/2014  . Knee pain, bilateral 11/04/2014  . Headache 07/14/2014  . Abdominal aortic atherosclerosis ([place]) 04/28/2014  . History of pulmonary embolism 02/07/2014  . Acute deep vein thrombosis (DVT) of tibial vein of right lower extremity ([place]) 02/07/2014  . DVT of leg (deep venous thrombosis) ([place]) 02/06/2014  . Multiple myeloma ([place]) 01/27/2012  . Hyponatremia 01/16/2012  . Anemia 01/16/2012  . Dyspnea 04/18/2011  . Preop cardiovascular exam 04/18/2011  . ABDOMINAL PAIN-RUQ 04/29/2010  . IRRITABLE BOWEL SYNDROME 02/19/2009  . GERD 01/03/2008  . PERSONAL HX COLONIC POLYPS 01/03/2008    Awaiting Xarelto to clear;  In meantime will begin TNA;  Probable surgery on Mon/Tuesday * No surgery found *    LOS: 15 days   Matt B. Hassell Done, MD, [facility] [phone] beeper [phone]  07/09/2016 8:14 AM

## 2016-07-09 NOTE — Progress Notes (Signed)
PHARMACY - ADULT TOTAL PARENTERAL NUTRITION CONSULT NOTE   Pharmacy Consult for TPN Indication: bowel obstruction  Patient Measurements: Height: '5\' 9"'$  (175.3 cm) Weight: 199 lb 11.8 oz (90.6 kg) IBW/kg (Calculated) : 70.7 TPN AdjBW (KG): 79.3 Body mass index is 29.5 kg/m.  Insulin Requirements: 4 units of SSI in past 24 hours  Current Nutrition: none  IVF: NS at 100 ml/hr  Central access: implanted port TPN start date: 12/30  ASSESSMENT                                                                                                          HPI: 56 yoM with recent admission 11/26-12/4 for SBO, re-admitted on 12/15 with PE and recurrent SBO. PMH is significant for metastatic lung cancer (immunotherapy with Nat Math on hold) and chronic anticoagulation for history of PE/DVT s/p IVC filter placement.  Surgery reconsulted 12/29 because SBO not improving.  Unable to tolerate fluids, continued N/V.  NG tube placement failed multiple times.  Pt to proceed with surgery once Xarelto cleared.  Xarelto d/c'd 12/29 and surgery wants to wait 3 days for clearance.  TPN to start 12/30.  Significant events:   Today, 07/09/2016:    Glucose - no h/o DM, serum glucose 97-140  Electrolytes - K+ 3.3, other lytes WNL including corrected Ca  Renal - WNL  LFTs - all below ULN  TGs - ordered for AM  Prealbumin - ordered for AM  NUTRITIONAL GOALS                                                                                             RD recs (12/28): 115-125g protein, 2200-2400 kcal, 2.2L fluid/day  (+++There is currently a Psychologist, prison and probation services of Clinimix solution.  Pharmacy will use Clinimix product based on what we have available in stock+++) Clinimix E 5/20 at a goal rate of 100 ml/hr daily + 20% fat emulsion at 10 ml/hr on MWF only to provide: 120g/day protein, 2318Kcal/day.  PLAN  At 1800 today:  Start Clinimix 5/15 at 40 ml/hr.  Will start with 5/15 lyte-free formulation since this is the only product we currently have in the 1L volume.   20% fat emulsion at 10 ml/hr.   Plan to advance as tolerated to the goal rate.  TPN to contain standard multivitamins and trace elements.  Reduce IVF to 60 ml/hr and add 20 mEq of KCl to IVF since providing electrolyte-free Clinimix formulation and K+ low.  In addition, will provide KCl 10 mEq/100 mL IV x 2.  Add CBG checks and sensitive SSI q4h.  TPN lab panels on Mondays & Thursdays.  F/u daily. TPN labs in AM.  Hershal Coria 07/09/2016,9:35 AM

## 2016-07-10 LAB — GLUCOSE, CAPILLARY
GLUCOSE-CAPILLARY: 128 mg/dL — AB (ref 65–99)
GLUCOSE-CAPILLARY: 130 mg/dL — AB (ref 65–99)
GLUCOSE-CAPILLARY: 139 mg/dL — AB (ref 65–99)
GLUCOSE-CAPILLARY: 149 mg/dL — AB (ref 65–99)
Glucose-Capillary: 158 mg/dL — ABNORMAL HIGH (ref 65–99)
Glucose-Capillary: 145 mg/dL — ABNORMAL HIGH (ref 65–99)

## 2016-07-10 LAB — COMPREHENSIVE METABOLIC PANEL
ALT: 13 U/L — ABNORMAL LOW (ref 17–63)
ANION GAP: 7 (ref 5–15)
AST: 17 U/L (ref 15–41)
Albumin: 1.7 g/dL — ABNORMAL LOW (ref 3.5–5.0)
Alkaline Phosphatase: 60 U/L (ref 38–126)
BILIRUBIN TOTAL: 0.7 mg/dL (ref 0.3–1.2)
BUN: 20 mg/dL (ref 6–20)
CO2: 25 mmol/L (ref 22–32)
Calcium: 7.3 mg/dL — ABNORMAL LOW (ref 8.9–10.3)
Chloride: 108 mmol/L (ref 101–111)
Creatinine, Ser: 0.56 mg/dL — ABNORMAL LOW (ref 0.61–1.24)
Glucose, Bld: 133 mg/dL — ABNORMAL HIGH (ref 65–99)
POTASSIUM: 3.8 mmol/L (ref 3.5–5.1)
Sodium: 140 mmol/L (ref 135–145)
TOTAL PROTEIN: 4 g/dL — AB (ref 6.5–8.1)

## 2016-07-10 LAB — CBC
HEMATOCRIT: 24.3 % — AB (ref 39.0–52.0)
Hemoglobin: 7.8 g/dL — ABNORMAL LOW (ref 13.0–17.0)
MCH: 32.5 pg (ref 26.0–34.0)
MCHC: 32.1 g/dL (ref 30.0–36.0)
MCV: 101.3 fL — ABNORMAL HIGH (ref 78.0–100.0)
PLATELETS: 41 10*3/uL — AB (ref 150–400)
RBC: 2.4 MIL/uL — ABNORMAL LOW (ref 4.22–5.81)
RDW: 20.3 % — AB (ref 11.5–15.5)
WBC: 7.1 10*3/uL (ref 4.0–10.5)

## 2016-07-10 LAB — PREALBUMIN: PREALBUMIN: 12.5 mg/dL — AB (ref 18–38)

## 2016-07-10 LAB — TRIGLYCERIDES: TRIGLYCERIDES: 165 mg/dL — AB (ref <150)

## 2016-07-10 LAB — DIFFERENTIAL
BASOS ABS: 0 10*3/uL (ref 0.0–0.1)
BASOS PCT: 0 %
EOS ABS: 0 10*3/uL (ref 0.0–0.7)
Eosinophils Relative: 0 %
Lymphocytes Relative: 3 %
Lymphs Abs: 0.2 10*3/uL — ABNORMAL LOW (ref 0.7–4.0)
MONO ABS: 0.2 10*3/uL (ref 0.1–1.0)
MONOS PCT: 2 %
Neutro Abs: 6.7 10*3/uL (ref 1.7–7.7)
Neutrophils Relative %: 94 %

## 2016-07-10 LAB — MAGNESIUM: MAGNESIUM: 2.1 mg/dL (ref 1.7–2.4)

## 2016-07-10 LAB — PHOSPHORUS: Phosphorus: 1.6 mg/dL — ABNORMAL LOW (ref 2.5–4.6)

## 2016-07-10 MED ORDER — FAT EMULSION 20 % IV EMUL
240.0000 mL | INTRAVENOUS | Status: AC
  Administered 2016-07-10: 240 mL via INTRAVENOUS
  Filled 2016-07-10: qty 240

## 2016-07-10 MED ORDER — TRACE MINERALS CR-CU-MN-SE-ZN 10-1000-500-60 MCG/ML IV SOLN
INTRAVENOUS | Status: AC
  Administered 2016-07-10: 18:00:00 via INTRAVENOUS
  Filled 2016-07-10: qty 960

## 2016-07-10 MED ORDER — KETOROLAC TROMETHAMINE 30 MG/ML IJ SOLN
30.0000 mg | Freq: Once | INTRAMUSCULAR | Status: AC
  Administered 2016-07-10: 30 mg via INTRAVENOUS
  Filled 2016-07-10: qty 1

## 2016-07-10 MED ORDER — SODIUM CHLORIDE 0.9 % IV SOLN
INTRAVENOUS | Status: DC
  Administered 2016-07-10 – 2016-07-11 (×2): 1000 mL via INTRAVENOUS
  Administered 2016-07-12: 19:00:00 via INTRAVENOUS

## 2016-07-10 MED ORDER — DEXTROSE 5 % IV SOLN
20.0000 mmol | Freq: Once | INTRAVENOUS | Status: AC
  Administered 2016-07-10: 20 mmol via INTRAVENOUS
  Filled 2016-07-10: qty 6.67

## 2016-07-10 NOTE — Progress Notes (Signed)
Patient ID: [name], male   DOB: Nov 30, 1940, 75 y.o.   MRN: [id number] [facility] Surgery Progress Note:   * No surgery found *  Subjective: Mental status is stable and mildly confused at times Objective: Vital signs in last 24 hours: Temp:  [98.2 F (36.8 C)-98.3 F (36.8 C)] 98.3 F (36.8 C) (12/31 0603) Pulse Rate:  [87-107] 87 (12/31 0603) Resp:  [16-18] 18 (12/31 0603) BP: (85-113)/(56-74) 99/56 (12/31 0603) SpO2:  [95 %-96 %] 95 % (12/31 0603)  Intake/Output from previous day: 12/30 0701 - 12/31 0700 In: 2365.5 [I.V.:2165.5; IV Piggyback:200] Out: 325 [Urine:325] Intake/Output this shift: No intake/output data recorded.  Physical Exam: Work of breathing is not labored.  No appreciable abdominal pain.  TNA begun  Lab Results:  Results for orders placed or performed during the hospital encounter of 06/24/16 (from the past 48 hour(s))  Glucose, capillary     Status: Abnormal   Collection Time: 07/08/16  8:46 AM  Result Value Ref Range   Glucose-Capillary 125 (H) 65 - 99 mg/dL   Comment 1 Notify RN    Comment 2 Document in Chart   Glucose, capillary     Status: Abnormal   Collection Time: 07/08/16  6:45 PM  Result Value Ref Range   Glucose-Capillary 140 (H) 65 - 99 mg/dL   Comment 1 Notify RN    Comment 2 Document in Chart   Glucose, capillary     Status: Abnormal   Collection Time: 07/08/16  8:33 PM  Result Value Ref Range   Glucose-Capillary 111 (H) 65 - 99 mg/dL  Glucose, capillary     Status: Abnormal   Collection Time: 07/08/16 11:36 PM  Result Value Ref Range   Glucose-Capillary 100 (H) 65 - 99 mg/dL  Glucose, capillary     Status: Abnormal   Collection Time: 07/09/16  4:00 AM  Result Value Ref Range   Glucose-Capillary 101 (H) 65 - 99 mg/dL  Glucose, capillary     Status: None   Collection Time: 07/09/16  8:12 AM  Result Value Ref Range   Glucose-Capillary 97 65 - 99 mg/dL  Glucose, capillary     Status: None   Collection Time: 07/09/16  12:19 PM  Result Value Ref Range   Glucose-Capillary 95 65 - 99 mg/dL   Comment 1 Notify RN    Comment 2 Document in Chart   CBC     Status: Abnormal   Collection Time: 07/09/16 12:20 PM  Result Value Ref Range   WBC 7.8 4.0 - 10.5 K/uL   RBC 2.58 (L) 4.22 - 5.81 MIL/uL   Hemoglobin 8.4 (L) 13.0 - 17.0 g/dL   HCT 26.1 (L) 39.0 - 52.0 %   MCV 101.2 (H) 78.0 - 100.0 fL   MCH 32.6 26.0 - 34.0 pg   MCHC 32.2 30.0 - 36.0 g/dL   RDW 20.3 (H) 11.5 - 15.5 %   Platelets 51 (L) 150 - 400 K/uL    Comment: CONSISTENT WITH PREVIOUS RESULT REPEATED TO VERIFY   Comprehensive metabolic panel     Status: Abnormal   Collection Time: 07/09/16 12:20 PM  Result Value Ref Range   Sodium 143 135 - 145 mmol/L   Potassium 3.3 (L) 3.5 - 5.1 mmol/L   Chloride 107 101 - 111 mmol/L   CO2 27 22 - 32 mmol/L   Glucose, Bld 101 (H) 65 - 99 mg/dL   BUN 22 (H) 6 - 20 mg/dL   Creatinine, Ser 0.56 (L)  0.61 - 1.24 mg/dL   Calcium 7.5 (L) 8.9 - 10.3 mg/dL   Total Protein 4.3 (L) 6.5 - 8.1 g/dL   Albumin 2.0 (L) 3.5 - 5.0 g/dL   AST 13 (L) 15 - 41 U/L   ALT 16 (L) 17 - 63 U/L   Alkaline Phosphatase 67 38 - 126 U/L   Total Bilirubin 1.0 0.3 - 1.2 mg/dL   GFR calc non Af Amer >60 >60 mL/min   GFR calc Af Amer >60 >60 mL/min    Comment: (NOTE) The eGFR has been calculated using the CKD EPI equation. This calculation has not been validated in all clinical situations. eGFR's persistently <60 mL/min signify possible Chronic Kidney Disease.    Anion gap 9 5 - 15  Magnesium     Status: None   Collection Time: 07/09/16 12:20 PM  Result Value Ref Range   Magnesium 2.3 1.7 - 2.4 mg/dL  Phosphorus     Status: None   Collection Time: 07/09/16 12:20 PM  Result Value Ref Range   Phosphorus 2.7 2.5 - 4.6 mg/dL  Glucose, capillary     Status: Abnormal   Collection Time: 07/09/16  5:47 PM  Result Value Ref Range   Glucose-Capillary 104 (H) 65 - 99 mg/dL   Comment 1 Notify RN    Comment 2 Document in Chart    Glucose, capillary     Status: Abnormal   Collection Time: 07/09/16  7:56 PM  Result Value Ref Range   Glucose-Capillary 113 (H) 65 - 99 mg/dL   Comment 1 Notify RN    Comment 2 Document in Chart   Glucose, capillary     Status: Abnormal   Collection Time: 07/10/16 12:15 AM  Result Value Ref Range   Glucose-Capillary 139 (H) 65 - 99 mg/dL  Glucose, capillary     Status: Abnormal   Collection Time: 07/10/16  4:03 AM  Result Value Ref Range   Glucose-Capillary 158 (H) 65 - 99 mg/dL  Comprehensive metabolic panel     Status: Abnormal   Collection Time: 07/10/16  6:08 AM  Result Value Ref Range   Sodium 140 135 - 145 mmol/L   Potassium 3.8 3.5 - 5.1 mmol/L   Chloride 108 101 - 111 mmol/L   CO2 25 22 - 32 mmol/L   Glucose, Bld 133 (H) 65 - 99 mg/dL   BUN 20 6 - 20 mg/dL   Creatinine, Ser 0.56 (L) 0.61 - 1.24 mg/dL   Calcium 7.3 (L) 8.9 - 10.3 mg/dL   Total Protein 4.0 (L) 6.5 - 8.1 g/dL   Albumin 1.7 (L) 3.5 - 5.0 g/dL   AST 17 15 - 41 U/L   ALT 13 (L) 17 - 63 U/L   Alkaline Phosphatase 60 38 - 126 U/L   Total Bilirubin 0.7 0.3 - 1.2 mg/dL   GFR calc non Af Amer >60 >60 mL/min   GFR calc Af Amer >60 >60 mL/min    Comment: (NOTE) The eGFR has been calculated using the CKD EPI equation. This calculation has not been validated in all clinical situations. eGFR's persistently <60 mL/min signify possible Chronic Kidney Disease.    Anion gap 7 5 - 15  Magnesium     Status: None   Collection Time: 07/10/16  6:08 AM  Result Value Ref Range   Magnesium 2.1 1.7 - 2.4 mg/dL  Phosphorus     Status: Abnormal   Collection Time: 07/10/16  6:08 AM  Result Value Ref Range  Phosphorus 1.6 (L) 2.5 - 4.6 mg/dL  CBC     Status: Abnormal   Collection Time: 07/10/16  6:08 AM  Result Value Ref Range   WBC 7.1 4.0 - 10.5 K/uL   RBC 2.40 (L) 4.22 - 5.81 MIL/uL   Hemoglobin 7.8 (L) 13.0 - 17.0 g/dL   HCT 24.3 (L) 39.0 - 52.0 %   MCV 101.3 (H) 78.0 - 100.0 fL   MCH 32.5 26.0 - 34.0 pg    MCHC 32.1 30.0 - 36.0 g/dL   RDW 20.3 (H) 11.5 - 15.5 %   Platelets 41 (L) 150 - 400 K/uL    Comment: CONSISTENT WITH PREVIOUS RESULT  Differential     Status: Abnormal   Collection Time: 07/10/16  6:08 AM  Result Value Ref Range   Neutrophils Relative % 94 %   Neutro Abs 6.7 1.7 - 7.7 K/uL   Lymphocytes Relative 3 %   Lymphs Abs 0.2 (L) 0.7 - 4.0 K/uL   Monocytes Relative 2 %   Monocytes Absolute 0.2 0.1 - 1.0 K/uL   Eosinophils Relative 0 %   Eosinophils Absolute 0.0 0.0 - 0.7 K/uL   Basophils Relative 0 %   Basophils Absolute 0.0 0.0 - 0.1 K/uL  Glucose, capillary     Status: Abnormal   Collection Time: 07/10/16  8:03 AM  Result Value Ref Range   Glucose-Capillary 149 (H) 65 - 99 mg/dL   Comment 1 Notify RN    Comment 2 Document in Chart    *Note: Due to a large number of results and/or encounters for the requested time period, some results have not been displayed. A complete set of results can be found in Results Review.    Radiology/Results: Dg Abd 2 Views  Result Date: 07/08/2016 CLINICAL DATA:  Abdominal pain, nausea, vomiting. EXAM: ABDOMEN - 2 VIEW COMPARISON:  None. FINDINGS: Dilated small bowel loops are again noted consistent with distal small bowel obstruction. IVC filter is noted. Phleboliths are noted in the pelvis. No free air is noted suggest pneumoperitoneum. IMPRESSION: Stable small bowel dilatation is noted consistent with distal small bowel obstruction. Electronically Signed   By: Marijo Conception, M.D.   On: 07/08/2016 18:30    Anti-infectives: Anti-infectives    Start     Dose/Rate Route Frequency Ordered Stop   06/30/16 0830  metroNIDAZOLE (FLAGYL) IVPB 500 mg  Status:  Discontinued     500 mg 100 mL/hr over 60 Minutes Intravenous Every 8 hours 06/30/16 0735 07/05/16 1045   06/29/16 1800  meropenem (MERREM) 1 g in sodium chloride 0.9 % 100 mL IVPB  Status:  Discontinued     1 g 200 mL/hr over 30 Minutes Intravenous Every 8 hours 06/29/16 1552 07/07/16  1126   06/29/16 1430  levofloxacin (LEVAQUIN) IVPB 750 mg  Status:  Discontinued     750 mg 100 mL/hr over 90 Minutes Intravenous Every 24 hours 06/29/16 1355 06/29/16 1543   06/25/16 1000  levofloxacin (LEVAQUIN) tablet 500 mg  Status:  Discontinued     500 mg Oral Daily 06/24/16 2026 06/25/16 0941      Assessment/Plan: Problem List: Patient Active Problem List   Diagnosis Date Noted  . Generalized abdominal pain   . Sepsis associated hypotension ([place])   . Benign prostatic hyperplasia without lower urinary tract symptoms   . GERD (gastroesophageal reflux disease) 06/24/2016  . Chronic diastolic CHF (congestive heart failure) ([place]) 06/24/2016  . GIB (gastrointestinal bleeding) 06/24/2016  . Malnutrition of moderate degree  06/20/2016  . Malignant neoplasm of upper lobe of right lung ([place])   . Pulmonary emboli ([place]) 06/19/2016  . Pulmonary embolus ([place]) 06/19/2016  . Abnormal CT scan, small bowel   . Encephalopathy, metabolic   . Thrombocytopenia ([place])   . Diabetes insipidus ([place])   . Hypernatremia   . SBO (small bowel obstruction) 06/06/2016  . Hypokalemia 06/06/2016  . Adenocarcinoma of right lung, stage 4 ([place]) 05/27/2016  . Nausea with vomiting 05/18/2016  . Dehydration 05/18/2016  . Brain metastases ([place]) 05/04/2016  . Mediastinal adenopathy 05/04/2016  . Peripheral neuropathy due to chemotherapy ([place]) 02/08/2016  . Long term current use of anticoagulant therapy 11/04/2014  . Peripheral edema 11/04/2014  . Knee pain, bilateral 11/04/2014  . Headache 07/14/2014  . Abdominal aortic atherosclerosis ([place]) 04/28/2014  . History of pulmonary embolism 02/07/2014  . Acute deep vein thrombosis (DVT) of tibial vein of right lower extremity ([place]) 02/07/2014  . DVT of leg (deep venous thrombosis) ([place]) 02/06/2014  . Multiple myeloma ([place]) 01/27/2012  . Hyponatremia 01/16/2012  . Anemia 01/16/2012  . Dyspnea 04/18/2011  . Preop cardiovascular exam 04/18/2011  . ABDOMINAL  PAIN-RUQ 04/29/2010  . IRRITABLE BOWEL SYNDROME 02/19/2009  . GERD 01/03/2008  . PERSONAL HX COLONIC POLYPS 01/03/2008    Albumin is low.  TNA begun.  Will try to get nutritional status up prior to laparoscopy-Tues at the earliest and maybe later.   * No surgery found *    LOS: 16 days   Matt B. Hassell Done, MD, [facility] [phone] beeper [phone]  07/10/2016 8:10 AM

## 2016-07-10 NOTE — Progress Notes (Addendum)
PHARMACY - ADULT TOTAL PARENTERAL NUTRITION CONSULT NOTE   Pharmacy Consult for TPN Indication: bowel obstruction  Patient Measurements: Height: '5\' 9"'$  (175.3 cm) Weight: 199 lb 11.8 oz (90.6 kg) IBW/kg (Calculated) : 70.7 TPN AdjBW (KG): 79.3 Body mass index is 29.5 kg/m.  Insulin Requirements: 4 units of SSI in past 24 hours  Current Nutrition: none  IVF: NS with 20 mEq KCl at 60 ml/hr  Central access: implanted port TPN start date: 12/30  ASSESSMENT                                                                                                          HPI: 85 yoM with recent admission 11/26-12/4 for SBO, re-admitted on 12/15 with PE and recurrent SBO. PMH is significant for metastatic lung cancer (immunotherapy with Nat Math on hold) and chronic anticoagulation for history of PE/DVT s/p IVC filter placement.  Surgery reconsulted 12/29 because SBO not improving.  Unable to tolerate fluids, continued N/V.  NG tube placement failed multiple times.  Pt to proceed with surgery once Xarelto cleared.  Xarelto d/c'd 12/29 and surgery wants to wait 3 days for clearance.  TPN to start 12/30.  Significant events:   Today, 07/10/2016:    Glucose - no h/o DM, CBGs 113-158. Only 1 CBG slightly above goal (158). On hydrocortisone q8h.  Anticipate may need to adjust SSI to moderate scale once switch to 5/20 formulation.  Electrolytes - K+ 3.8 after replacement, Phos decreased to 1.6, Mag WNL and other lytes WNL including corrected calcium  Renal - WNL  LFTs - all below ULN  TGs - pending  Prealbumin - pending  NUTRITIONAL GOALS                                                                                             RD recs (12/28): 115-125g protein, 2200-2400 kcal, 2.2L fluid/day  (+++There is currently a Psychologist, prison and probation services of Clinimix solution.  Pharmacy will use Clinimix product based on what we have available in stock+++) Clinimix E 5/20 at a goal rate of 100 ml/hr daily + 20%  fat emulsion at 10 ml/hr on MWF only to provide: 120g/day protein, 2318Kcal/day.  PLAN  At 1800 today:  Switch to Clinimix E 5/20 formulation today.  This is the only formulation we currently have in stock containing electrolytes.  Patient's phosphorous decreased on electrolyte-free formulation.  Would prefer to replace phosphorous today and which to electrolyte-containing TPN going forward.  Will not advance rate today.  Will be using a 2L bag of E 5/20 Clinimix and keeping rate at 40 ml/hr.  Hope to correct phos and increase rate tomorrow.  20% fat emulsion at 10 ml/hr. Will switch to MWF once get Clinimix at goal.  Keeping daily for now for extra calories.  Plan to advance as tolerated to the goal rate.  TPN to contain standard multivitamins and trace elements.  KPhos 20 mmol IV x 1  Continue at 50 ml/hr but will remove the 20 mEq of KCl to IVF since providing electrolyte-containing Clinimix formulation.  Continue CBG checks and sensitive SSI q4h.  TPN lab panels on Mondays & Thursdays.  F/u daily.   Hershal Coria 07/10/2016,9:19 AM

## 2016-07-10 NOTE — Progress Notes (Signed)
[facility]  PROGRESS NOTE  [name] JSE:[id number] DOB: 07-Jan-1941 DOA: 06/24/2016 PCP: [name], MD   Brief HPI:   75 year old with stage IV adenocarcinoma, arthritis, BPH, DVT. He has diagnosis of stage IVB(T1b, N2, M1c) non-small cell lung cancer, adenocarcinoma with metastases to the brain diagnosed in October 2017. He was undergoing chemotherapy with Prembrolizomab and recent dx of recurrent SBO, PE on eliquis who was admitted on 12/15 with severe abdominal pain. He was assessed by surgery who recommended conservative management with IVF and NG decompression.  On 06/29/16 he developed nausea and projectile vomiting, decreased blood pressure. Noted to have gram-negative rod bacteremia. He was transferred to stepdown for further management and PCCM consulted.  PCCM asked to see on 12/20 when he became progressively hypotensive. CT imaging of abd raising concern for ischemia vs infectious colitis. New acute LE DVT in-spite of NOAC, which we had to stop 12/21 d/t colitis related GIB and hgb drop of almost 2 gms. Now s/p IVC filter. Surgery following and recommends against surgery and as of 12/27 he was tolerating a diet.  Had some gas pain on 12/27, failed NG but feels OK 12/28 with normal bowel sounds and passing gas Patient Yesterday again started having vomiting, general surgery was consulted and patient again kept nothing by mouth for possible surgical intervention.  Subjective    This morning patient denies any pain.    Assessment/Plan:     1. Small bowel obstruction-Recurrent patient has again been seen by surgery and plan for surgical intervention Monday or Tuesday. In the meantime TNA has been started. 2. Colitis- seen on the CT abdomen ischemic versus infectious, patient was treated with Flagyl from  12/21 to 12/26. Ischemic colitis is suspected in setting of small bowel obstruction.  3. Non-small cell lung cancer- patient on chemotherapy, followed by oncology as  outpatient. 4. History of PE/DVT- patient was on eliquis, he developed new DVTs on NOAC, status post IVC filter placement. Patient was started on Xarelto which is currently on hold for surgery on Monday or Tuesday. 5. Bacteroides Vulgatus  bacteremia (beta lactamase +)- patient treated with antibiotics, currently off antibiotics. Repeat blood cultures on 06/29/2016 negative. 6. Diabetes insipidus- continue DDAVP, will cut down the dose of Solucortef to 50 mg po q 8 hr. 7. Diabetes mellitus- start sliding scale insulin with Novolog. 8. Hypotension- continue Midodrine 15 mg po tid.     DVT prophylaxis: Anti-coagulation with Xarelto  Code Status: Full code  Family Communication: No family present at bedside   Disposition Plan: To be decided, based on patient's condition after surgery next week.   Consultants:  General surgery  PCCM  Procedures: Venous US 12/21>>>Findings consistent with acute deep vein thrombosis involvingperoneal, popliteal, and distal femoral arteryof the right lowerextremity. - Findings consistent with acute deep vein thrombosis involving the popliteal and gastrocnemius veins of the left lower extremity. - Findings consistent with chronic deep vein thrombosis involving them proximal posterior tibial vein of the right lower extremity. - No evidence of a superficial thrombosis of the right or left lower extremity. CT abd/pelvis 12/20: Persistently dilated fluid and gas filled small bowel loops to level of the right lower quadrant/ distal ilium with abrupt transition without mass identified and therefore may be related to stricture/adhesion.Interval development of abnormal thickening of the ascending colon and proximal descending colon with surrounding inflammation of fat planes suggesting colitis whether inflammatory infectious or ischemic.  CULTURES: BCX2 12/15: bacteroides vulgatus BCX2 12/20>>> negative  . sodium chloride 1,000  mL (07/10/16 1025)   . TPN (CLINIMIX) Adult without lytes 40 mL/hr at 07/09/16 1909   And  . fat emulsion 240 mL (07/09/16 1909)  . Marland KitchenTPN (CLINIMIX-E) Adult     And  . fat emulsion        Antibiotics:   Anti-infectives    Start     Dose/Rate Route Frequency Ordered Stop   06/30/16 0830  metroNIDAZOLE (FLAGYL) IVPB 500 mg  Status:  Discontinued     500 mg 100 mL/hr over 60 Minutes Intravenous Every 8 hours 06/30/16 0735 07/05/16 1045   06/29/16 1800  meropenem (MERREM) 1 g in sodium chloride 0.9 % 100 mL IVPB  Status:  Discontinued     1 g 200 mL/hr over 30 Minutes Intravenous Every 8 hours 06/29/16 1552 07/07/16 1126   06/29/16 1430  levofloxacin (LEVAQUIN) IVPB 750 mg  Status:  Discontinued     750 mg 100 mL/hr over 90 Minutes Intravenous Every 24 hours 06/29/16 1355 06/29/16 1543   06/25/16 1000  levofloxacin (LEVAQUIN) tablet 500 mg  Status:  Discontinued     500 mg Oral Daily 06/24/16 2026 06/25/16 0941       Objective   Vitals:   07/09/16 1657 07/09/16 2159 07/10/16 0603 07/10/16 1248  BP: 96/64 113/74 (!) 99/56 (!) 98/58  Pulse: (!) 106 97 87 75  Resp:  '18 18 16  '$ Temp:  98.2 F (36.8 C) 98.3 F (36.8 C) 97.6 F (36.4 C)  TempSrc:  Oral Oral Oral  SpO2:  96% 95% 96%  Weight:      Height:        Intake/Output Summary (Last 24 hours) at 07/10/16 1614 Last data filed at 07/10/16 1419  Gross per 24 hour  Intake           1378.5 ml  Output              525 ml  Net            853.5 ml   Filed Weights   07/07/16 0500 07/08/16 0500 07/09/16 0611  Weight: 95.8 kg (211 lb 3.2 oz) 91.1 kg (200 lb 13.4 oz) 90.6 kg (199 lb 11.8 oz)     Physical Examination:  General exam: Appears calm and comfortable. Respiratory system: Clear to auscultation. Respiratory effort normal. Cardiovascular system:  RRR. No  murmurs, rubs, gallops. No pedal edema. GI system: Abdomen is nondistended, Mild tenderness to palpation in the left lower quadrant. No organomegaly.  Central nervous system. No  focal neurological deficits. 5 x 5 power in all extremities. Skin: No rashes, lesions or ulcers. Psychiatry: Alert, oriented x 3.Judgement and insight appear normal. Affect normal.    Data Reviewed: I have personally reviewed following labs and imaging studies  CBG:  Recent Labs Lab 07/10/16 0015 07/10/16 0403 07/10/16 0803 07/10/16 1246 07/10/16 1604  GLUCAP 139* 158* 149* 145* 128*    CBC:  Recent Labs Lab 07/06/16 0500 07/07/16 0511 07/08/16 0545 07/09/16 1220 07/10/16 0608  WBC 6.3 8.2 10.7* 7.8 7.1  NEUTROABS  --   --  9.9*  --  6.7  HGB 8.4* 9.4* 10.1* 8.4* 7.8*  HCT 25.5* 27.9* 30.8* 26.1* 24.3*  MCV 95.1 97.6 99.7 101.2* 101.3*  PLT 47* 50* 62* 51* 41*    Basic Metabolic Panel:  Recent Labs Lab 07/04/16 0522 07/05/16 0534 07/06/16 0500 07/07/16 0511 07/08/16 0545 07/09/16 1220 07/10/16 0608  NA 139 137 133* 139 145 143 140  K 2.9* 3.1* 4.0  3.9 3.6 3.3* 3.8  CL 104 100* 99* 103 107 107 108  CO2 '27 30 29 30 30 27 25  '$ GLUCOSE 170* 123* 100* 95 127* 101* 133*  BUN '14 12 15 15 17 '$ 22* 20  CREATININE 0.72 0.61 0.67 0.68 0.81 0.56* 0.56*  CALCIUM 7.5* 7.1* 7.4* 8.1* 8.7* 7.5* 7.3*  MG 1.7 1.9 1.8  --   --  2.3 2.1  PHOS 1.7* 2.5 2.5  --   --  2.7 1.6*    No results found for this or any previous visit (from the past 240 hour(s)).    Cardiac Enzymes: No results for input(s): CKTOTAL, CKMB, CKMBINDEX, TROPONINI in the last 168 hours. BNP (last 3 results)  Recent Labs  06/05/16 2354 06/19/16 1210 06/24/16 2151  BNP 45.8 33.6 94.7    ProBNP (last 3 results) No results for input(s): PROBNP in the last 8760 hours.    Studies: Dg Abd 2 Views  Result Date: 07/08/2016 CLINICAL DATA:  Abdominal pain, nausea, vomiting. EXAM: ABDOMEN - 2 VIEW COMPARISON:  None. FINDINGS: Dilated small bowel loops are again noted consistent with distal small bowel obstruction. IVC filter is noted. Phleboliths are noted in the pelvis. No free air is noted suggest  pneumoperitoneum. IMPRESSION: Stable small bowel dilatation is noted consistent with distal small bowel obstruction. Electronically Signed   By: Marijo Conception, M.D.   On: 07/08/2016 18:30    Scheduled Meds: . cholecalciferol  1,000 Units Oral QHS  . desmopressin  0.1 mg Oral QHS  . hydrocortisone sod succinate (SOLU-CORTEF) inj  50 mg Intravenous Q8H  . insulin aspart  0-9 Units Subcutaneous Q4H  . mouth rinse  15 mL Mouth Rinse BID  . midodrine  15 mg Oral TID WC  . pantoprazole  40 mg Oral Q1200  . polyethylene glycol  17 g Oral Daily  . potassium phosphate IVPB (mmol)  20 mmol Intravenous Once  . simethicone  80 mg Oral QID  . sodium chloride flush  10-40 mL Intracatheter Q12H  . sodium chloride flush  3 mL Intravenous Q12H      Time spent: 25 min  [facility] Pager [phone]. If 7PM-7AM, please contact night-coverage at [email or web address], Office  [phone]  password [place] 07/10/2016, 4:14 PM  LOS: 16 days

## 2016-07-10 NOTE — Progress Notes (Addendum)
Initial Nutrition Assessment  DOCUMENTATION CODES:   Non-severe (moderate) malnutrition in context of chronic illness  INTERVENTION:   TPN per pharmacy  At 1800 today:  Switch to Clinimix E 5/20 keeping rate at 40 ml/hr.  Hope to correct phos and increase rate tomorrow.  20% fat emulsion at 10 ml/hr. Will switch to MWF once get Clinimix at goal.  Keeping daily for now for extra calories.  Monitor Labs; patient at high risk for refeeding syndrome  Recommend thiamine 114m per bag  NUTRITION DIAGNOSIS:   Malnutrition related to altered GI function, cancer and cancer related treatments as evidenced by moderate depletions of muscle mass, severe fluid accumulation, energy intake < or equal to 75% for > or equal to 1 month  GOAL:   Patient will meet greater than or equal to 90% of their needs  MONITOR:   PO intake, Labs  REASON FOR ASSESSMENT:   Consult New TPN/TNA  ASSESSMENT:   89yoM with recent admission 11/26-12/4 for SBO, re-admitted on 12/15 with PE and recurrent SBO. PMH is significant for metastatic lung cancer (immunotherapy with KNat Mathon hold) and chronic anticoagulation for history of PE/DVT s/p IVC filter placement.  Surgery reconsulted 12/29 because SBO not improving.  Unable to tolerate fluids, continued N/V.  NG tube placement failed multiple times.  Pt to proceed with surgery once Xarelto cleared.  Xarelto d/c'd 12/29 and surgery wants to wait 3 days for clearance.  TPN to start 12/30.75 yoM with recent admission 11/26-12/4 for SBO, re-admitted on 12/15 with PE and recurrent SBO. PMH is significant for metastatic lung cancer (immunotherapy with KNat Mathon hold) and chronic anticoagulation for history of PE/DVT s/p IVC filter placement.  Surgery reconsulted 12/29 because SBO not improving.  Unable to tolerate fluids, continued N/V.  NG tube placement failed multiple times.  Pt to proceed with surgery once Xarelto cleared.  Xarelto d/c'd 12/29 and surgery wants to  wait 3 days for clearance.  TPN to start 12/30.   Met with pt in room today. Pt reports poor appetite and oral intake since Thanksgiving. Pt currently on CL diet but reports that he is eating 0% of it. Pt started TPN 12/30. Pt is at high risk for refeeding. Pt has moderate to severe generalized edema. Pt's pre alb, alb, and creat low today. Phos 1.6 today, pt received KPhos 20 mmol IV x 1. Per pharmacy note, "Patient's phosphorous decreased on electrolyte-free formulation.  Would prefer to replace phosphorous today and which to electrolyte-containing TPN going forward.  Will not advance rate today.  Will be using a 2L bag of E 5/20 Clinimix and keeping rate at 40 ml/hr.  Hope to correct phos and increase rate tomorrow." Continue to monitor labs. Pt has lost 13lbs(6%) in two months. This is not significant. Plan is for pt to have surgery 1/2 for SBO. Goal is to increase pts pre-albumin prior to surgery.     Medications reviewed and include: vitamin D, insulin, solu-cortef, protonix, miralax, K phophate, simethicone, morphine  Labs reviewed: creat 0.56(L), Ca 7.3(L) adj. 9.14 wnl, P 1.6(L), Mg 2.1 wnl, Alb 1.7(L), pre alb 12.5(L), triglycerides 165(H) Hgb 7.8(L), Hct 24.3(L)  Nutrition-Focused physical exam completed. Findings are no  fat depletion, moderate muscle depletion, and severe edema.   Diet Order:  TPN (CLINIMIX) Adult without lytes TPN (CLINIMIX-E) Adult  Skin:  Wound (see comment) (foot wound )  Last BM:  12/30  Height:   Ht Readings from Last 1 Encounters:  07/08/16 _0  (1.753  m)    Weight:   Wt Readings from Last 1 Encounters:  07/09/16 199 lb 11.8 oz (90.6 kg)    Ideal Body Weight:  72.7 kg  BMI:  Body mass index is 29.5 kg/m.  Estimated Nutritional Needs:   Kcal:  2200-2500kcal/day   Protein:  117-135g/day   Fluid:  >2.2L/day   EDUCATION NEEDS:   No education needs identified at this time  Koleen Distance, RD, LDN Pager #[phone]

## 2016-07-11 LAB — DIFFERENTIAL
Basophils Absolute: 0 10*3/uL (ref 0.0–0.1)
Basophils Relative: 0 %
EOS PCT: 0 %
Eosinophils Absolute: 0 10*3/uL (ref 0.0–0.7)
LYMPHS PCT: 3 %
Lymphs Abs: 0.2 10*3/uL — ABNORMAL LOW (ref 0.7–4.0)
MONO ABS: 0.2 10*3/uL (ref 0.1–1.0)
MONOS PCT: 3 %
NEUTROS ABS: 6.4 10*3/uL (ref 1.7–7.7)
Neutrophils Relative %: 94 %

## 2016-07-11 LAB — CBC
HEMATOCRIT: 22.1 % — AB (ref 39.0–52.0)
Hemoglobin: 7.3 g/dL — ABNORMAL LOW (ref 13.0–17.0)
MCH: 32.9 pg (ref 26.0–34.0)
MCHC: 33 g/dL (ref 30.0–36.0)
MCV: 99.5 fL (ref 78.0–100.0)
PLATELETS: 54 10*3/uL — AB (ref 150–400)
RBC: 2.22 MIL/uL — AB (ref 4.22–5.81)
RDW: 19.7 % — AB (ref 11.5–15.5)
WBC: 6.8 10*3/uL (ref 4.0–10.5)

## 2016-07-11 LAB — GLUCOSE, CAPILLARY
GLUCOSE-CAPILLARY: 120 mg/dL — AB (ref 65–99)
GLUCOSE-CAPILLARY: 144 mg/dL — AB (ref 65–99)
GLUCOSE-CAPILLARY: 127 mg/dL — AB (ref 65–99)
GLUCOSE-CAPILLARY: 216 mg/dL — AB (ref 65–99)
Glucose-Capillary: 122 mg/dL — ABNORMAL HIGH (ref 65–99)
Glucose-Capillary: 128 mg/dL — ABNORMAL HIGH (ref 65–99)
Glucose-Capillary: 152 mg/dL — ABNORMAL HIGH (ref 65–99)

## 2016-07-11 LAB — COMPREHENSIVE METABOLIC PANEL
ALK PHOS: 64 U/L (ref 38–126)
ALT: 14 U/L — ABNORMAL LOW (ref 17–63)
ANION GAP: 6 (ref 5–15)
AST: 16 U/L (ref 15–41)
Albumin: 1.8 g/dL — ABNORMAL LOW (ref 3.5–5.0)
BILIRUBIN TOTAL: 0.3 mg/dL (ref 0.3–1.2)
BUN: 18 mg/dL (ref 6–20)
CALCIUM: 7.5 mg/dL — AB (ref 8.9–10.3)
CO2: 26 mmol/L (ref 22–32)
Chloride: 107 mmol/L (ref 101–111)
Creatinine, Ser: 0.54 mg/dL — ABNORMAL LOW (ref 0.61–1.24)
GFR calc Af Amer: >60 mL/min (ref >60)
GFR calc non Af Amer: >60 mL/min (ref >60)
Glucose, Bld: 153 mg/dL — ABNORMAL HIGH (ref 65–99)
Potassium: 3.3 mmol/L — ABNORMAL LOW (ref 3.5–5.1)
SODIUM: 139 mmol/L (ref 135–145)
TOTAL PROTEIN: 4 g/dL — AB (ref 6.5–8.1)

## 2016-07-11 LAB — MAGNESIUM: Magnesium: 2.1 mg/dL (ref 1.7–2.4)

## 2016-07-11 LAB — TRIGLYCERIDES: Triglycerides: 184 mg/dL — ABNORMAL HIGH (ref <150)

## 2016-07-11 LAB — PREALBUMIN: Prealbumin: 10.7 mg/dL — ABNORMAL LOW (ref 18–38)

## 2016-07-11 LAB — PHOSPHORUS: Phosphorus: 1.5 mg/dL — ABNORMAL LOW (ref 2.5–4.6)

## 2016-07-11 MED ORDER — PROCHLORPERAZINE EDISYLATE 5 MG/ML IJ SOLN
5.0000 mg | Freq: Four times a day (QID) | INTRAMUSCULAR | Status: AC | PRN
  Administered 2016-07-11 – 2016-07-12 (×2): 5 mg via INTRAVENOUS
  Filled 2016-07-11 (×2): qty 2

## 2016-07-11 MED ORDER — TRACE MINERALS CR-CU-MN-SE-ZN 10-1000-500-60 MCG/ML IV SOLN
INTRAVENOUS | Status: AC
  Administered 2016-07-11: 17:00:00 via INTRAVENOUS
  Filled 2016-07-11: qty 1680

## 2016-07-11 MED ORDER — POTASSIUM CHLORIDE 10 MEQ/100ML IV SOLN
10.0000 meq | INTRAVENOUS | Status: AC
  Administered 2016-07-11 (×2): 10 meq via INTRAVENOUS
  Filled 2016-07-11 (×2): qty 100

## 2016-07-11 MED ORDER — POTASSIUM PHOSPHATES 15 MMOLE/5ML IV SOLN
20.0000 mmol | Freq: Once | INTRAVENOUS | Status: AC
  Administered 2016-07-11: 20 mmol via INTRAVENOUS
  Filled 2016-07-11: qty 6.67

## 2016-07-11 MED ORDER — FAT EMULSION 20 % IV EMUL
240.0000 mL | INTRAVENOUS | Status: AC
  Administered 2016-07-11: 240 mL via INTRAVENOUS
  Filled 2016-07-11: qty 250

## 2016-07-11 NOTE — Progress Notes (Signed)
[facility]  PROGRESS NOTE  [name] EPP:[id number] DOB: [birth year] DOA: 06/24/2016 PCP: [name], MD   Brief HPI:   76 year old with stage IV adenocarcinoma, arthritis, BPH, DVT. He has diagnosis of stage IVB(T1b, N2, M1c) non-small cell lung cancer, adenocarcinoma with metastases to the brain diagnosed in October 2017. He was undergoing chemotherapy with Prembrolizomab and recent dx of recurrent SBO, PE on eliquis who was admitted on 12/15 with severe abdominal pain. He was assessed by surgery who recommended conservative management with IVF and NG decompression.  On 06/29/16 he developed nausea and projectile vomiting, decreased blood pressure. Noted to have gram-negative rod bacteremia. He was transferred to stepdown for further management and PCCM consulted.  PCCM asked to see on 12/20 when he became progressively hypotensive. CT imaging of abd raising concern for ischemia vs infectious colitis. New acute LE DVT in-spite of NOAC, which we had to stop 12/21 d/t colitis related GIB and hgb drop of almost 2 gms. Now s/p IVC filter. Surgery following and recommends against surgery and as of 12/27 he was tolerating a diet.  Had some gas pain on 12/27, failed NG but feels OK 12/28 with normal bowel sounds and passing gas Patient Yesterday again started having vomiting, general surgery was consulted and patient again kept nothing by mouth for possible surgical intervention.  Subjective    This morning patient complains of abdominal cramping.   Assessment/Plan:     1. Small bowel obstruction-Recurrent patient has again been seen by surgery and plan for surgical intervention Monday or Tuesday. In the meantime TNA has been started. 2. Colitis- seen on the CT abdomen ischemic versus infectious, patient was treated with Flagyl from  12/21 to 12/26. Ischemic colitis is suspected in setting of small bowel obstruction.  3. Non-small cell lung cancer- patient on chemotherapy, followed by  oncology as outpatient. 4. History of PE/DVT- patient was on eliquis, he developed new DVTs on NOAC, status post IVC filter placement. Patient was started on Xarelto which is currently on hold for surgery on Monday or Tuesday. 5. Bacteroides Vulgatus  bacteremia (beta lactamase +)- patient treated with antibiotics, currently off antibiotics. Repeat blood cultures on 06/29/2016 negative. 6. Diabetes insipidus- continue DDAVP, will cut down the dose of Solucortef to 50 mg po q 8 hr. 7. Anemia- hemoglobin is 7.3, dropped from 8.7 on 12/24, likely dilutional. 8. Diabetes mellitus- start sliding scale insulin with Novolog. 9. Hypotension- continue Midodrine 15 mg po tid.   DVT prophylaxis: Anti-coagulation with Xarelto  Code Status: Full code  Family Communication: No family present at bedside   Disposition Plan: To be decided, based on patient's condition after surgery.   Consultants:  General surgery  PCCM  Procedures: Venous US 12/21>>>Findings consistent with acute deep vein thrombosis involvingperoneal, popliteal, and distal femoral arteryof the right lowerextremity. - Findings consistent with acute deep vein thrombosis involving the popliteal and gastrocnemius veins of the left lower extremity. - Findings consistent with chronic deep vein thrombosis involving them proximal posterior tibial vein of the right lower extremity. - No evidence of a superficial thrombosis of the right or left lower extremity. CT abd/pelvis 12/20: Persistently dilated fluid and gas filled small bowel loops to level of the right lower quadrant/ distal ilium with abrupt transition without mass identified and therefore may be related to stricture/adhesion.Interval development of abnormal thickening of the ascending colon and proximal descending colon with surrounding inflammation of fat planes suggesting colitis whether inflammatory infectious or ischemic.  CULTURES: BCX2 12/15: bacteroides  vulgatus BCX2 12/20>>> negative  . Marland KitchenTPN (CLINIMIX-E) Adult     And  . fat emulsion    . sodium chloride 1,000 mL (07/10/16 1025)  . Marland KitchenTPN (CLINIMIX-E) Adult 40 mL/hr at 07/10/16 1736   And  . fat emulsion 240 mL (07/10/16 1736)      Antibiotics:   Anti-infectives    Start     Dose/Rate Route Frequency Ordered Stop   06/30/16 0830  metroNIDAZOLE (FLAGYL) IVPB 500 mg  Status:  Discontinued     500 mg 100 mL/hr over 60 Minutes Intravenous Every 8 hours 06/30/16 0735 07/05/16 1045   06/29/16 1800  meropenem (MERREM) 1 g in sodium chloride 0.9 % 100 mL IVPB  Status:  Discontinued     1 g 200 mL/hr over 30 Minutes Intravenous Every 8 hours 06/29/16 1552 07/07/16 1126   06/29/16 1430  levofloxacin (LEVAQUIN) IVPB 750 mg  Status:  Discontinued     750 mg 100 mL/hr over 90 Minutes Intravenous Every 24 hours 06/29/16 1355 06/29/16 1543   06/25/16 1000  levofloxacin (LEVAQUIN) tablet 500 mg  Status:  Discontinued     500 mg Oral Daily 06/24/16 2026 06/25/16 0941       Objective   Vitals:   07/10/16 0603 07/10/16 1248 07/10/16 2026 07/11/16 0408  BP: (!) 99/56 (!) 98/58 125/64 111/71  Pulse: 87 75 71 81  Resp: '18 16 16 16  '$ Temp: 98.3 F (36.8 C) 97.6 F (36.4 C) 98.2 F (36.8 C) 98.6 F (37 C)  TempSrc: Oral Oral Oral Oral  SpO2: 95% 96% 96% 93%  Weight:      Height:        Intake/Output Summary (Last 24 hours) at 07/11/16 1148 Last data filed at 07/11/16 1000  Gross per 24 hour  Intake          1022.17 ml  Output              400 ml  Net           622.17 ml   Filed Weights   07/07/16 0500 07/08/16 0500 07/09/16 0611  Weight: 95.8 kg (211 lb 3.2 oz) 91.1 kg (200 lb 13.4 oz) 90.6 kg (199 lb 11.8 oz)     Physical Examination:  General exam: Appears calm and comfortable. Respiratory system: Clear to auscultation. Respiratory effort normal. Cardiovascular system:  RRR. No  murmurs, rubs, gallops. No pedal edema. GI system: Abdomen is nondistended, Mild tenderness  to palpation in the left lower quadrant. No organomegaly.  Central nervous system. No focal neurological deficits. 5 x 5 power in all extremities. Skin: No rashes, lesions or ulcers. Psychiatry: Alert, oriented x 3.Judgement and insight appear normal. Affect normal.    Data Reviewed: I have personally reviewed following labs and imaging studies  CBG:  Recent Labs Lab 07/10/16 1604 07/10/16 2021 07/11/16 0008 07/11/16 0403 07/11/16 0744  GLUCAP 128* 130* 128* 144* 127*    CBC:  Recent Labs Lab 07/07/16 0511 07/08/16 0545 07/09/16 1220 07/10/16 0608 07/11/16 0405  WBC 8.2 10.7* 7.8 7.1 6.8  NEUTROABS  --  9.9*  --  6.7 6.4  HGB 9.4* 10.1* 8.4* 7.8* 7.3*  HCT 27.9* 30.8* 26.1* 24.3* 22.1*  MCV 97.6 99.7 101.2* 101.3* 99.5  PLT 50* 62* 51* 41* 54*    Basic Metabolic Panel:  Recent Labs Lab 07/05/16 0534 07/06/16 0500 07/07/16 0511 07/08/16 0545 07/09/16 1220 07/10/16 0608 07/11/16 0405  NA 137 133* 139 145 143 140 139  K 3.1* 4.0 3.9 3.6 3.3* 3.8 3.3*  CL 100* 99* 103 107 107 108 107  CO2 '30 29 30 30 27 25 26  '$ GLUCOSE 123* 100* 95 127* 101* 133* 153*  BUN '12 15 15 17 '$ 22* 20 18  CREATININE 0.61 0.67 0.68 0.81 0.56* 0.56* 0.54*  CALCIUM 7.1* 7.4* 8.1* 8.7* 7.5* 7.3* 7.5*  MG 1.9 1.8  --   --  2.3 2.1 2.1  PHOS 2.5 2.5  --   --  2.7 1.6* 1.5*    No results found for this or any previous visit (from the past 240 hour(s)).    Cardiac Enzymes: No results for input(s): CKTOTAL, CKMB, CKMBINDEX, TROPONINI in the last 168 hours. BNP (last 3 results)  Recent Labs  06/05/16 2354 06/19/16 1210 06/24/16 2151  BNP 45.8 33.6 94.7    ProBNP (last 3 results) No results for input(s): PROBNP in the last 8760 hours.    Studies: No results found.  Scheduled Meds: . cholecalciferol  1,000 Units Oral QHS  . desmopressin  0.1 mg Oral QHS  . hydrocortisone sod succinate (SOLU-CORTEF) inj  50 mg Intravenous Q8H  . insulin aspart  0-9 Units Subcutaneous Q4H   . mouth rinse  15 mL Mouth Rinse BID  . midodrine  15 mg Oral TID WC  . pantoprazole  40 mg Oral Q1200  . polyethylene glycol  17 g Oral Daily  . potassium phosphate IVPB (mmol)  20 mmol Intravenous Once  . simethicone  80 mg Oral QID  . sodium chloride flush  10-40 mL Intracatheter Q12H  . sodium chloride flush  3 mL Intravenous Q12H      Time spent: 25 min  [facility] Pager [phone]. If 7PM-7AM, please contact night-coverage at [email or web address], Office  [phone]  password [email or web address] 07/11/2016, 11:48 AM  LOS: 17 days

## 2016-07-11 NOTE — Progress Notes (Signed)
Patient ID: [name], male   DOB: 13-Sep-1940, 76 y.o.   MRN: [id number] [facility] Surgery Progress Note:   * No surgery found *  Subjective: Mental status is clear.   Objective: Vital signs in last 24 hours: Temp:  [97.6 F (36.4 C)-98.6 F (37 C)] 98.6 F (37 C) (01/01 0408) Pulse Rate:  [71-81] 81 (01/01 0408) Resp:  [16] 16 (01/01 0408) BP: (98-125)/(58-71) 111/71 (01/01 0408) SpO2:  [93 %-96 %] 93 % (01/01 0408)  Intake/Output from previous day: 12/31 0701 - 01/01 0700 In: 1012.2 [I.V.:1012.2] Out: 600 [Urine:600] Intake/Output this shift: No intake/output data recorded.  Physical Exam: Work of breathing is not labored.  No abdominal pain  Lab Results:  Results for orders placed or performed during the hospital encounter of 06/24/16 (from the past 48 hour(s))  Glucose, capillary     Status: None   Collection Time: 07/09/16 12:19 PM  Result Value Ref Range   Glucose-Capillary 95 65 - 99 mg/dL   Comment 1 Notify RN    Comment 2 Document in Chart   CBC     Status: Abnormal   Collection Time: 07/09/16 12:20 PM  Result Value Ref Range   WBC 7.8 4.0 - 10.5 K/uL   RBC 2.58 (L) 4.22 - 5.81 MIL/uL   Hemoglobin 8.4 (L) 13.0 - 17.0 g/dL   HCT 26.1 (L) 39.0 - 52.0 %   MCV 101.2 (H) 78.0 - 100.0 fL   MCH 32.6 26.0 - 34.0 pg   MCHC 32.2 30.0 - 36.0 g/dL   RDW 20.3 (H) 11.5 - 15.5 %   Platelets 51 (L) 150 - 400 K/uL    Comment: CONSISTENT WITH PREVIOUS RESULT REPEATED TO VERIFY   Comprehensive metabolic panel     Status: Abnormal   Collection Time: 07/09/16 12:20 PM  Result Value Ref Range   Sodium 143 135 - 145 mmol/L   Potassium 3.3 (L) 3.5 - 5.1 mmol/L   Chloride 107 101 - 111 mmol/L   CO2 27 22 - 32 mmol/L   Glucose, Bld 101 (H) 65 - 99 mg/dL   BUN 22 (H) 6 - 20 mg/dL   Creatinine, Ser 0.56 (L) 0.61 - 1.24 mg/dL   Calcium 7.5 (L) 8.9 - 10.3 mg/dL   Total Protein 4.3 (L) 6.5 - 8.1 g/dL   Albumin 2.0 (L) 3.5 - 5.0 g/dL   AST 13 (L) 15 - 41 U/L   ALT 16  (L) 17 - 63 U/L   Alkaline Phosphatase 67 38 - 126 U/L   Total Bilirubin 1.0 0.3 - 1.2 mg/dL   GFR calc non Af Amer >60 >60 mL/min   GFR calc Af Amer >60 >60 mL/min    Comment: (NOTE) The eGFR has been calculated using the CKD EPI equation. This calculation has not been validated in all clinical situations. eGFR's persistently <60 mL/min signify possible Chronic Kidney Disease.    Anion gap 9 5 - 15  Magnesium     Status: None   Collection Time: 07/09/16 12:20 PM  Result Value Ref Range   Magnesium 2.3 1.7 - 2.4 mg/dL  Phosphorus     Status: None   Collection Time: 07/09/16 12:20 PM  Result Value Ref Range   Phosphorus 2.7 2.5 - 4.6 mg/dL  Glucose, capillary     Status: Abnormal   Collection Time: 07/09/16  5:47 PM  Result Value Ref Range   Glucose-Capillary 104 (H) 65 - 99 mg/dL   Comment 1 Notify RN  Comment 2 Document in Chart   Glucose, capillary     Status: Abnormal   Collection Time: 07/09/16  7:56 PM  Result Value Ref Range   Glucose-Capillary 113 (H) 65 - 99 mg/dL   Comment 1 Notify RN    Comment 2 Document in Chart   Glucose, capillary     Status: Abnormal   Collection Time: 07/10/16 12:15 AM  Result Value Ref Range   Glucose-Capillary 139 (H) 65 - 99 mg/dL  Glucose, capillary     Status: Abnormal   Collection Time: 07/10/16  4:03 AM  Result Value Ref Range   Glucose-Capillary 158 (H) 65 - 99 mg/dL  Comprehensive metabolic panel     Status: Abnormal   Collection Time: 07/10/16  6:08 AM  Result Value Ref Range   Sodium 140 135 - 145 mmol/L   Potassium 3.8 3.5 - 5.1 mmol/L   Chloride 108 101 - 111 mmol/L   CO2 25 22 - 32 mmol/L   Glucose, Bld 133 (H) 65 - 99 mg/dL   BUN 20 6 - 20 mg/dL   Creatinine, Ser 0.56 (L) 0.61 - 1.24 mg/dL   Calcium 7.3 (L) 8.9 - 10.3 mg/dL   Total Protein 4.0 (L) 6.5 - 8.1 g/dL   Albumin 1.7 (L) 3.5 - 5.0 g/dL   AST 17 15 - 41 U/L   ALT 13 (L) 17 - 63 U/L   Alkaline Phosphatase 60 38 - 126 U/L   Total Bilirubin 0.7 0.3 - 1.2  mg/dL   GFR calc non Af Amer >60 >60 mL/min   GFR calc Af Amer >60 >60 mL/min    Comment: (NOTE) The eGFR has been calculated using the CKD EPI equation. This calculation has not been validated in all clinical situations. eGFR's persistently <60 mL/min signify possible Chronic Kidney Disease.    Anion gap 7 5 - 15  Magnesium     Status: None   Collection Time: 07/10/16  6:08 AM  Result Value Ref Range   Magnesium 2.1 1.7 - 2.4 mg/dL  Phosphorus     Status: Abnormal   Collection Time: 07/10/16  6:08 AM  Result Value Ref Range   Phosphorus 1.6 (L) 2.5 - 4.6 mg/dL  CBC     Status: Abnormal   Collection Time: 07/10/16  6:08 AM  Result Value Ref Range   WBC 7.1 4.0 - 10.5 K/uL   RBC 2.40 (L) 4.22 - 5.81 MIL/uL   Hemoglobin 7.8 (L) 13.0 - 17.0 g/dL   HCT 24.3 (L) 39.0 - 52.0 %   MCV 101.3 (H) 78.0 - 100.0 fL   MCH 32.5 26.0 - 34.0 pg   MCHC 32.1 30.0 - 36.0 g/dL   RDW 20.3 (H) 11.5 - 15.5 %   Platelets 41 (L) 150 - 400 K/uL    Comment: CONSISTENT WITH PREVIOUS RESULT  Differential     Status: Abnormal   Collection Time: 07/10/16  6:08 AM  Result Value Ref Range   Neutrophils Relative % 94 %   Neutro Abs 6.7 1.7 - 7.7 K/uL   Lymphocytes Relative 3 %   Lymphs Abs 0.2 (L) 0.7 - 4.0 K/uL   Monocytes Relative 2 %   Monocytes Absolute 0.2 0.1 - 1.0 K/uL   Eosinophils Relative 0 %   Eosinophils Absolute 0.0 0.0 - 0.7 K/uL   Basophils Relative 0 %   Basophils Absolute 0.0 0.0 - 0.1 K/uL  Prealbumin     Status: Abnormal   Collection Time: 07/10/16  7:47 AM  Result Value Ref Range   Prealbumin 12.5 (L) 18 - 38 mg/dL    Comment: Performed at [facility]  Triglycerides     Status: Abnormal   Collection Time: 07/10/16  7:47 AM  Result Value Ref Range   Triglycerides 165 (H) <150 mg/dL    Comment: Performed at [facility]  Glucose, capillary     Status: Abnormal   Collection Time: 07/10/16  8:03 AM  Result Value Ref Range   Glucose-Capillary 149 (H) 65 - 99 mg/dL    Comment 1 Notify RN    Comment 2 Document in Chart   Glucose, capillary     Status: Abnormal   Collection Time: 07/10/16 12:46 PM  Result Value Ref Range   Glucose-Capillary 145 (H) 65 - 99 mg/dL   Comment 1 Notify RN    Comment 2 Document in Chart   Glucose, capillary     Status: Abnormal   Collection Time: 07/10/16  4:04 PM  Result Value Ref Range   Glucose-Capillary 128 (H) 65 - 99 mg/dL   Comment 1 Notify RN    Comment 2 Document in Chart   Glucose, capillary     Status: Abnormal   Collection Time: 07/10/16  8:21 PM  Result Value Ref Range   Glucose-Capillary 130 (H) 65 - 99 mg/dL  Glucose, capillary     Status: Abnormal   Collection Time: 07/11/16 12:08 AM  Result Value Ref Range   Glucose-Capillary 128 (H) 65 - 99 mg/dL   Comment 1 Notify RN   Glucose, capillary     Status: Abnormal   Collection Time: 07/11/16  4:03 AM  Result Value Ref Range   Glucose-Capillary 144 (H) 65 - 99 mg/dL   Comment 1 Notify RN   Comprehensive metabolic panel     Status: Abnormal   Collection Time: 07/11/16  4:05 AM  Result Value Ref Range   Sodium 139 135 - 145 mmol/L   Potassium 3.3 (L) 3.5 - 5.1 mmol/L   Chloride 107 101 - 111 mmol/L   CO2 26 22 - 32 mmol/L   Glucose, Bld 153 (H) 65 - 99 mg/dL   BUN 18 6 - 20 mg/dL   Creatinine, Ser 0.54 (L) 0.61 - 1.24 mg/dL   Calcium 7.5 (L) 8.9 - 10.3 mg/dL   Total Protein 4.0 (L) 6.5 - 8.1 g/dL   Albumin 1.8 (L) 3.5 - 5.0 g/dL   AST 16 15 - 41 U/L   ALT 14 (L) 17 - 63 U/L   Alkaline Phosphatase 64 38 - 126 U/L   Total Bilirubin 0.3 0.3 - 1.2 mg/dL   GFR calc non Af Amer >60 >60 mL/min   GFR calc Af Amer >60 >60 mL/min    Comment: (NOTE) The eGFR has been calculated using the CKD EPI equation. This calculation has not been validated in all clinical situations. eGFR's persistently <60 mL/min signify possible Chronic Kidney Disease.    Anion gap 6 5 - 15  Magnesium     Status: None   Collection Time: 07/11/16  4:05 AM  Result Value Ref  Range   Magnesium 2.1 1.7 - 2.4 mg/dL  Phosphorus     Status: Abnormal   Collection Time: 07/11/16  4:05 AM  Result Value Ref Range   Phosphorus 1.5 (L) 2.5 - 4.6 mg/dL  CBC     Status: Abnormal   Collection Time: 07/11/16  4:05 AM  Result Value Ref Range   WBC 6.8  4.0 - 10.5 K/uL   RBC 2.22 (L) 4.22 - 5.81 MIL/uL   Hemoglobin 7.3 (L) 13.0 - 17.0 g/dL   HCT 22.1 (L) 39.0 - 52.0 %   MCV 99.5 78.0 - 100.0 fL   MCH 32.9 26.0 - 34.0 pg   MCHC 33.0 30.0 - 36.0 g/dL   RDW 19.7 (H) 11.5 - 15.5 %   Platelets 54 (L) 150 - 400 K/uL    Comment: CONSISTENT WITH PREVIOUS RESULT  Differential     Status: Abnormal   Collection Time: 07/11/16  4:05 AM  Result Value Ref Range   Neutrophils Relative % 94 %   Neutro Abs 6.4 1.7 - 7.7 K/uL   Lymphocytes Relative 3 %   Lymphs Abs 0.2 (L) 0.7 - 4.0 K/uL   Monocytes Relative 3 %   Monocytes Absolute 0.2 0.1 - 1.0 K/uL   Eosinophils Relative 0 %   Eosinophils Absolute 0.0 0.0 - 0.7 K/uL   Basophils Relative 0 %   Basophils Absolute 0.0 0.0 - 0.1 K/uL  Triglycerides     Status: Abnormal   Collection Time: 07/11/16  4:05 AM  Result Value Ref Range   Triglycerides 184 (H) <150 mg/dL    Comment: Performed at [facility]  Glucose, capillary     Status: Abnormal   Collection Time: 07/11/16  7:44 AM  Result Value Ref Range   Glucose-Capillary 127 (H) 65 - 99 mg/dL   *Note: Due to a large number of results and/or encounters for the requested time period, some results have not been displayed. A complete set of results can be found in Results Review.    Radiology/Results: No results found.  Anti-infectives: Anti-infectives    Start     Dose/Rate Route Frequency Ordered Stop   06/30/16 0830  metroNIDAZOLE (FLAGYL) IVPB 500 mg  Status:  Discontinued     500 mg 100 mL/hr over 60 Minutes Intravenous Every 8 hours 06/30/16 0735 07/05/16 1045   06/29/16 1800  meropenem (MERREM) 1 g in sodium chloride 0.9 % 100 mL IVPB  Status:  Discontinued      1 g 200 mL/hr over 30 Minutes Intravenous Every 8 hours 06/29/16 1552 07/07/16 1126   06/29/16 1430  levofloxacin (LEVAQUIN) IVPB 750 mg  Status:  Discontinued     750 mg 100 mL/hr over 90 Minutes Intravenous Every 24 hours 06/29/16 1355 06/29/16 1543   06/25/16 1000  levofloxacin (LEVAQUIN) tablet 500 mg  Status:  Discontinued     500 mg Oral Daily 06/24/16 2026 06/25/16 0941      Assessment/Plan: Problem List: Patient Active Problem List   Diagnosis Date Noted  . Generalized abdominal pain   . Sepsis associated hypotension ([place])   . Benign prostatic hyperplasia without lower urinary tract symptoms   . GERD (gastroesophageal reflux disease) 06/24/2016  . Chronic diastolic CHF (congestive heart failure) ([place]) 06/24/2016  . GIB (gastrointestinal bleeding) 06/24/2016  . Malnutrition of moderate degree 06/20/2016  . Malignant neoplasm of upper lobe of right lung ([place])   . Pulmonary emboli ([place]) 06/19/2016  . Pulmonary embolus ([place]) 06/19/2016  . Abnormal CT scan, small bowel   . Encephalopathy, metabolic   . Thrombocytopenia ([place])   . Diabetes insipidus ([place])   . Hypernatremia   . SBO (small bowel obstruction) 06/06/2016  . Hypokalemia 06/06/2016  . Adenocarcinoma of right lung, stage 4 ([place]) 05/27/2016  . Nausea with vomiting 05/18/2016  . Dehydration 05/18/2016  . Brain metastases ([place]) 05/04/2016  . Mediastinal  adenopathy 05/04/2016  . Peripheral neuropathy due to chemotherapy ([place]) 02/08/2016  . Long term current use of anticoagulant therapy 11/04/2014  . Peripheral edema 11/04/2014  . Knee pain, bilateral 11/04/2014  . Headache 07/14/2014  . Abdominal aortic atherosclerosis ([place]) 04/28/2014  . History of pulmonary embolism 02/07/2014  . Acute deep vein thrombosis (DVT) of tibial vein of right lower extremity ([place]) 02/07/2014  . DVT of leg (deep venous thrombosis) ([place]) 02/06/2014  . Multiple myeloma ([place]) 01/27/2012  . Hyponatremia 01/16/2012  . Anemia 01/16/2012   . Dyspnea 04/18/2011  . Preop cardiovascular exam 04/18/2011  . ABDOMINAL PAIN-RUQ 04/29/2010  . IRRITABLE BOWEL SYNDROME 02/19/2009  . GERD 01/03/2008  . PERSONAL HX COLONIC POLYPS 01/03/2008    TNA day 2.  Will let Dr. Marcello Moores discuss surgical intervention with him.   * No surgery found *    LOS: 17 days   Matt B. Hassell Done, MD, [facility] [phone] beeper [phone]  07/11/2016 8:27 AM

## 2016-07-11 NOTE — Progress Notes (Signed)
PHARMACY - ADULT TOTAL PARENTERAL NUTRITION CONSULT NOTE   Pharmacy Consult for TPN Indication: bowel obstruction  Patient Measurements: Height: '5\' 9"'$  (175.3 cm) Weight: 199 lb 11.8 oz (90.6 kg) IBW/kg (Calculated) : 70.7 TPN AdjBW (KG): 79.3 Body mass index is 29.5 kg/m.  Insulin Requirements: 5 units of SSI in past 24 hours  Current Nutrition: none  IVF: NS at 50 ml/hr  Central access: implanted port TPN start date: 12/30  ASSESSMENT                                                                                                          HPI: 20 yoM with recent admission 11/26-12/4 for SBO, re-admitted on 12/15 with PE and recurrent SBO. PMH is significant for metastatic lung cancer (immunotherapy with Nat Math on hold) and chronic anticoagulation for history of PE/DVT s/p IVC filter placement.  Surgery reconsulted 12/29 because SBO not improving.  Unable to tolerate fluids, continued N/V.  NG tube placement failed multiple times.  Pt to proceed with surgery once Xarelto cleared.  Xarelto d/c'd 12/29 and surgery wants to wait 3 days for clearance.  TPN to start 12/30.  Significant events:  1/1: possible laparoscopy tomorrow 1/2 at earliest but [facility] wants to get nutritional status prior to surgery  Today, 07/11/2016:   Glucose - no h/o DM, CBGs good. Only 1 CBG slightly above goal (158). On hydrocortisone q8h.    Electrolytes - K+ 3.3 after replacement, Phos decreased to 1.5, Mag WNL and other lytes WNL   Renal - WNL  LFTs - stable  TGs - 165 (12/31)  Prealbumin - 12.5 (12/31)  NUTRITIONAL GOALS                                                                                             RD recs (12/28): 115-125g protein, 2200-2400 kcal, 2.2L fluid/day  (+++There is currently a Psychologist, prison and probation services of Clinimix solution.  Pharmacy will use Clinimix product based on what we have available in stock+++)  Clinimix E 5/20 at a goal rate of 100 ml/hr daily + 20% fat emulsion at 10  ml/hr on MWF only to provide: 120g/day protein, 2318Kcal/day.  PLAN   KPhos 20 mMol IV x 1  2 Runs IV KCL  At 1800 today:  Continue Clinimix E 5/20 and increase rate to 70 ml/hr  20% fat emulsion at 10 ml/hr. Will switch to MWF once get Clinimix at goal.  Keeping daily for now for extra calories.  TPN to contain standard multivitamins and trace elements.  Continue CBG checks and sensitive SSI q4h.  TPN lab panels on Mondays & Thursdays.  F/u daily.     Adrian Saran, PharmD, BCPS Pager [phone] 07/11/2016 7:59 AM

## 2016-07-12 DIAGNOSIS — I82409 Acute embolism and thrombosis of unspecified deep veins of unspecified lower extremity: Secondary | ICD-10-CM

## 2016-07-12 DIAGNOSIS — O2230 Deep phlebothrombosis in pregnancy, unspecified trimester: Secondary | ICD-10-CM

## 2016-07-12 LAB — COMPREHENSIVE METABOLIC PANEL
ALT: 27 U/L (ref 17–63)
AST: 45 U/L — ABNORMAL HIGH (ref 15–41)
Albumin: 2.2 g/dL — ABNORMAL LOW (ref 3.5–5.0)
Alkaline Phosphatase: 97 U/L (ref 38–126)
Anion gap: 13 (ref 5–15)
BILIRUBIN TOTAL: 0.7 mg/dL (ref 0.3–1.2)
BUN: 18 mg/dL (ref 6–20)
CALCIUM: 7.7 mg/dL — AB (ref 8.9–10.3)
CHLORIDE: 105 mmol/L (ref 101–111)
CO2: 18 mmol/L — ABNORMAL LOW (ref 22–32)
CREATININE: 0.77 mg/dL (ref 0.61–1.24)
GFR calc Af Amer: >60 mL/min (ref >60)
GFR calc non Af Amer: >60 mL/min (ref >60)
Glucose, Bld: 229 mg/dL — ABNORMAL HIGH (ref 65–99)
Potassium: 3.8 mmol/L (ref 3.5–5.1)
Sodium: 136 mmol/L (ref 135–145)
Total Protein: 5.1 g/dL — ABNORMAL LOW (ref 6.5–8.1)

## 2016-07-12 LAB — GLUCOSE, CAPILLARY
GLUCOSE-CAPILLARY: 179 mg/dL — AB (ref 65–99)
GLUCOSE-CAPILLARY: 220 mg/dL — AB (ref 65–99)
Glucose-Capillary: 180 mg/dL — ABNORMAL HIGH (ref 65–99)
Glucose-Capillary: 170 mg/dL — ABNORMAL HIGH (ref 65–99)
Glucose-Capillary: 146 mg/dL — ABNORMAL HIGH (ref 65–99)
Glucose-Capillary: 170 mg/dL — ABNORMAL HIGH (ref 65–99)
Glucose-Capillary: 144 mg/dL — ABNORMAL HIGH (ref 65–99)

## 2016-07-12 LAB — CBC
HCT: 34.9 % — ABNORMAL LOW (ref 39.0–52.0)
Hemoglobin: 11.7 g/dL — ABNORMAL LOW (ref 13.0–17.0)
MCH: 33.2 pg (ref 26.0–34.0)
MCHC: 33.5 g/dL (ref 30.0–36.0)
MCV: 99.1 fL (ref 78.0–100.0)
PLATELETS: 73 10*3/uL — AB (ref 150–400)
RBC: 3.52 MIL/uL — ABNORMAL LOW (ref 4.22–5.81)
RDW: 19.5 % — AB (ref 11.5–15.5)
WBC: 11.8 10*3/uL — ABNORMAL HIGH (ref 4.0–10.5)

## 2016-07-12 LAB — PHOSPHORUS: PHOSPHORUS: 2.6 mg/dL (ref 2.5–4.6)

## 2016-07-12 LAB — MAGNESIUM: MAGNESIUM: 2 mg/dL (ref 1.7–2.4)

## 2016-07-12 MED ORDER — FAT EMULSION 20 % IV EMUL
240.0000 mL | INTRAVENOUS | Status: DC
  Administered 2016-07-12: 240 mL via INTRAVENOUS
  Filled 2016-07-12: qty 240

## 2016-07-12 MED ORDER — RIVAROXABAN 20 MG PO TABS
20.0000 mg | ORAL_TABLET | Freq: Every day | ORAL | Status: DC
  Administered 2016-07-12: 20 mg via ORAL
  Filled 2016-07-12: qty 1

## 2016-07-12 MED ORDER — INSULIN ASPART 100 UNIT/ML ~~LOC~~ SOLN
0.0000 [IU] | SUBCUTANEOUS | Status: DC
  Administered 2016-07-12 (×2): 2 [IU] via SUBCUTANEOUS
  Administered 2016-07-12: 3 [IU] via SUBCUTANEOUS
  Administered 2016-07-13 (×4): 2 [IU] via SUBCUTANEOUS

## 2016-07-12 MED ORDER — TRACE MINERALS CR-CU-MN-SE-ZN 10-1000-500-60 MCG/ML IV SOLN
INTRAVENOUS | Status: DC
  Administered 2016-07-12: 18:00:00 via INTRAVENOUS
  Filled 2016-07-12: qty 1680

## 2016-07-12 MED ORDER — HYDROCORTISONE NA SUCCINATE PF 100 MG IJ SOLR
25.0000 mg | Freq: Three times a day (TID) | INTRAMUSCULAR | Status: DC
  Administered 2016-07-12 – 2016-07-13 (×3): 25 mg via INTRAVENOUS
  Filled 2016-07-12 (×3): qty 2

## 2016-07-12 NOTE — NC FL2 (Signed)
[place] LEVEL OF CARE SCREENING TOOL     IDENTIFICATION  Patient Name: SLADE PIERPOINT Birthdate: [birth year] Sex: male Admission Date (Current Location): 06/24/2016  [facility] and [place] Number:  Herbalist and Address:  [facility],  [place] [address] [place]      Provider Number: [id number]  Attending Physician Name and Address:  Oswald Hillock, MD  Relative Name and Phone Number:       Current Level of Care: Hospital Recommended Level of Care: [place] Prior Approval Number:    Date Approved/Denied:   PASRR Number: [id number]  Discharge Plan: SNF    Current Diagnoses: Patient Active Problem List   Diagnosis Date Noted  . Generalized abdominal pain   . Sepsis associated hypotension ([place])   . Benign prostatic hyperplasia without lower urinary tract symptoms   . GERD (gastroesophageal reflux disease) 06/24/2016  . Chronic diastolic CHF (congestive heart failure) ([place]) 06/24/2016  . GIB (gastrointestinal bleeding) 06/24/2016  . Malnutrition of moderate degree 06/20/2016  . Malignant neoplasm of upper lobe of right lung ([place])   . Pulmonary emboli ([place]) 06/19/2016  . Pulmonary embolus ([place]) 06/19/2016  . Abnormal CT scan, small bowel   . Encephalopathy, metabolic   . Thrombocytopenia ([place])   . Diabetes insipidus ([place])   . Hypernatremia   . SBO (small bowel obstruction) 06/06/2016  . Hypokalemia 06/06/2016  . Adenocarcinoma of right lung, stage 4 ([place]) 05/27/2016  . Nausea with vomiting 05/18/2016  . Dehydration 05/18/2016  . Brain metastases ([place]) 05/04/2016  . Mediastinal adenopathy 05/04/2016  . Peripheral neuropathy due to chemotherapy ([place]) 02/08/2016  . Long term current use of anticoagulant therapy 11/04/2014  . Peripheral edema 11/04/2014  . Knee pain, bilateral 11/04/2014  . Headache 07/14/2014  . Abdominal aortic atherosclerosis ([place]) 04/28/2014  . History of pulmonary embolism 02/07/2014   . Acute deep vein thrombosis (DVT) of tibial vein of right lower extremity ([place]) 02/07/2014  . DVT of leg (deep venous thrombosis) ([place]) 02/06/2014  . Multiple myeloma ([place]) 01/27/2012  . Hyponatremia 01/16/2012  . Anemia 01/16/2012  . Dyspnea 04/18/2011  . Preop cardiovascular exam 04/18/2011  . ABDOMINAL PAIN-RUQ 04/29/2010  . IRRITABLE BOWEL SYNDROME 02/19/2009  . GERD 01/03/2008  . PERSONAL HX COLONIC POLYPS 01/03/2008    Orientation RESPIRATION BLADDER Height & Weight     Self, Time, Situation, Place  Normal Incontinent Weight: 199 lb 11.8 oz (90.6 kg) Height:  '5\' 9"'$  (175.3 cm)  BEHAVIORAL SYMPTOMS/MOOD NEUROLOGICAL BOWEL NUTRITION STATUS  Other (Comment) (no behaviors)   Continent TNA (TPN)  AMBULATORY STATUS COMMUNICATION OF NEEDS Skin   Limited Assist Verbally Normal                       Personal Care Assistance Level of Assistance  Bathing, Feeding, Dressing Bathing Assistance: Limited assistance Feeding assistance:  (TPN) Dressing Assistance: Limited assistance     Functional Limitations Info  Sight, Hearing, Speech Sight Info: Adequate Hearing Info: Adequate Speech Info: Adequate    SPECIAL CARE FACTORS FREQUENCY  PT (By licensed PT), OT (By licensed OT)     PT Frequency: 5x wk OT Frequency: 5x wk            Contractures Contractures Info: Not present    Additional Factors Info  Code Status Code Status Info: Full Code             Current Medications (07/12/2016):  This is the current  hospital active medication list Current Facility-Administered Medications  Medication Dose Route Frequency Provider Last Rate Last Dose  . Marland KitchenTPN (CLINIMIX-E) Adult   Intravenous Continuous TPN Adrian Saran, RPH 70 mL/hr at 07/11/16 1713     And  . fat emulsion 20 % infusion 240 mL  240 mL Intravenous Continuous TPN Adrian Saran, RPH 10 mL/hr at 07/11/16 1714 240 mL at 07/11/16 1714  . Marland KitchenTPN (CLINIMIX-E) Adult   Intravenous Continuous TPN Drew A Wofford,  RPH       And  . fat emulsion 20 % infusion 240 mL  240 mL Intravenous Continuous TPN Drew A Wofford, RPH      . 0.9 %  sodium chloride infusion   Intravenous Continuous Dara Hoyer, RPH 50 mL/hr at 07/11/16 1156 1,000 mL at 07/11/16 1156  . acetaminophen (TYLENOL) tablet 650 mg  650 mg Oral Q6H PRN Ivor Costa, MD   650 mg at 07/03/16 0534   Or  . acetaminophen (TYLENOL) suppository 650 mg  650 mg Rectal Q6H PRN Ivor Costa, MD   650 mg at 06/29/16 2346  . albuterol (PROVENTIL) (2.5 MG/3ML) 0.083% nebulizer solution 2.5 mg  2.5 mg Nebulization Q4H PRN Ivor Costa, MD      . cholecalciferol (VITAMIN D) tablet 1,000 Units  1,000 Units Oral QHS Ivor Costa, MD   1,000 Units at 07/10/16 2158  . desmopressin (DDAVP) tablet 0.1 mg  0.1 mg Oral QHS Barton Dubois, MD   0.1 mg at 07/10/16 2158  . hydrocortisone sodium succinate (SOLU-CORTEF) 100 MG injection 25 mg  25 mg Intravenous Q8H Oswald Hillock, MD      . insulin aspart (novoLOG) injection 0-15 Units  0-15 Units Subcutaneous Q4H Polly Cobia, RPH   2 Units at 07/12/16 1239  . iopamidol (ISOVUE-300) 61 % injection 30 mL  30 mL Oral Once PRN Dron Tanna Furry, MD      . MEDLINE mouth rinse  15 mL Mouth Rinse BID Barton Dubois, MD   15 mL at 07/12/16 0957  . midodrine (PROAMATINE) tablet 15 mg  15 mg Oral TID WC Praveen Mannam, MD   15 mg at 07/12/16 1239  . morphine 4 MG/ML injection 4 mg  4 mg Intravenous Q4H PRN Oswald Hillock, MD   4 mg at 07/12/16 0029  . ondansetron (ZOFRAN) injection 4 mg  4 mg Intravenous Q8H PRN Ivor Costa, MD   4 mg at 07/11/16 2030  . pantoprazole (PROTONIX) EC tablet 40 mg  40 mg Oral Q1200 Erick Colace, NP   40 mg at 07/11/16 1210  . polyethylene glycol (MIRALAX / GLYCOLAX) packet 17 g  17 g Oral Daily Juanito Doom, MD   17 g at 07/11/16 1009  . prochlorperazine (COMPAZINE) injection 5 mg  5 mg Intravenous Q6H PRN Gardiner Barefoot, NP   5 mg at 07/11/16 2357  . rivaroxaban (XARELTO) tablet 20 mg  20 mg Oral Q  supper Drew A Wofford, RPH      . sodium chloride (OCEAN) 0.65 % nasal spray 1 spray  1 spray Each Nare PRN Anders Simmonds, MD   1 spray at 07/07/16 0015  . sodium chloride flush (NS) 0.9 % injection 10-40 mL  10-40 mL Intracatheter Q12H Dron Tanna Furry, MD   10 mL at 07/11/16 2224  . sodium chloride flush (NS) 0.9 % injection 10-40 mL  10-40 mL Intracatheter PRN Dron Tanna Furry, MD   10 mL at 07/08/16 1823  .  sodium chloride flush (NS) 0.9 % injection 3 mL  3 mL Intravenous Q12H Ivor Costa, MD   3 mL at 07/11/16 2223  . temazepam (RESTORIL) capsule 15 mg  15 mg Oral QHS PRN Jeryl Columbia, NP   15 mg at 07/09/16 2230     Discharge Medications: Please see discharge summary for a list of discharge medications.  Relevant Imaging Results:  Relevant Lab Results:   Additional Information ss # [id number]  Bruin Bolger, Randall An, LCSW

## 2016-07-12 NOTE — Progress Notes (Signed)
Physical Therapy Treatment Patient Details Name: [name] MRN: [id number] DOB: [birth year] Today's Date: 07/12/2016    History of Present Illness [name] is a 76 y.o. male with medical history significant of recently diagnosed stage IV NSCLC (80% PDL1+) being treated with pembrolizomab [name], immunotherapy), recent SBO (thought to be due to adhesions), DVT, multifocal PE (with possible HIT) on Eliquis, Gerd, multiple myeloma with skull lesion (s/p of radiation therapy 2016), chronic prostatitis, BPH, dCHF, diabetes insipidus on DDAVP, who presents with shortness of breath abdominal pain.    PT Comments    Very limited activity tolerance and deconditioned.  Hypotension with c/o dizziness with position change  Supine 92/72(81), HR 94 EOB     76/67(72), HR 99 Standing unable to obtain, pt unable to stand long enough to achieve  Pt did amb a short distance with ability to self rise but limited endurance.       Follow Up Recommendations  CIR (per PT eval)     Equipment Recommendations       Recommendations for Other Services       Precautions / Restrictions Precautions Precaution Comments: monitor BP, hypotensive Restrictions Weight Bearing Restrictions: No    Mobility  Bed Mobility               General bed mobility comments: OOB in recliner  Transfers Overall transfer level: Needs assistance Equipment used: Rolling walker (2 wheeled) Transfers: Sit to/from Stand Sit to Stand: Min assist;+2 safety/equipment         General transfer comment: cues for transition position and use of UEs to self assist  Ambulation/Gait Ambulation/Gait assistance: Min assist;+2 safety/equipment Ambulation Distance (Feet): 6 Feet Assistive device: Rolling walker (2 wheeled) Gait Pattern/deviations: Step-through pattern;Decreased step length - right;Decreased step length - left;Shuffle;Trunk flexed Gait velocity: decr   General Gait Details: cues for posture  and position from RW.  Distance ltd by BP changes and deconditioning/weakness   Stairs            Wheelchair Mobility    Modified Rankin (Stroke Patients Only)       Balance                                    Cognition Arousal/Alertness: Awake/alert Behavior During Therapy: WFL for tasks assessed/performed Overall Cognitive Status: Within Functional Limits for tasks assessed                      Exercises      General Comments        Pertinent Vitals/Pain Faces Pain Scale: Hurts little more Pain Location: intermittent abdominal pain Pain Descriptors / Indicators: Cramping Pain Intervention(s): Monitored during session;Repositioned    Home Living                      Prior Function            PT Goals (current goals can now be found in the care plan section) Progress towards PT goals: Progressing toward goals    Frequency    Min 3X/week      PT Plan Current plan remains appropriate    Co-evaluation             End of Session Equipment Utilized During Treatment: Gait belt Activity Tolerance: Patient limited by fatigue Patient left: in chair;with call bell/phone within reach     Time: 7106-2694  PT Time Calculation (min) (ACUTE ONLY): 24 min  Charges:  $Gait Training: 8-22 mins $Therapeutic Activity: 8-22 mins                    G Codes:      Rica Koyanagi  PTA [facility]  Acute  Rehab Pager      [phone]

## 2016-07-12 NOTE — Progress Notes (Signed)
PHARMACY - ADULT TOTAL PARENTERAL NUTRITION CONSULT NOTE   Pharmacy Consult for TPN Indication: bowel obstruction  Patient Measurements: Height: '5\' 9"'$  (175.3 cm) Weight: 199 lb 11.8 oz (90.6 kg) IBW/kg (Calculated) : 70.7 TPN AdjBW (KG): 79.3 Body mass index is 29.5 kg/m.  Insulin Requirements: 8 units of sensitive SSI yesterday  Current Nutrition: CLD starting 1/2; tolerating well per RN  IVF: NS at 50 ml/hr  Central access: implanted port TPN start date: 12/30  ASSESSMENT                                                                                                          HPI: 10 yoM with recent admission 11/26-12/4 for SBO, re-admitted on 12/15 with PE and recurrent SBO. PMH is significant for metastatic lung cancer (immunotherapy with Nat Math on hold) and chronic anticoagulation for history of PE/DVT s/p IVC filter placement.  Surgery reconsulted 12/29 because SBO not improving.  Unable to tolerate fluids, continued N/V.  NG tube placement failed multiple times.  Pt to proceed with surgery once Xarelto cleared.  Xarelto d/c'd 12/29 and surgery wants to wait 3 days for clearance.  TPN to start 12/30.  Significant events:  1/1: possible laparoscopy tomorrow 1/2 at earliest but CCS wants to get nutritional status prior to surgery 1/2: per Surgery, too debilitated for procedure. Plan is to d/c to SNF by end of week with or without TPN depending on if able to advance to/tolerate FLD - vomited up chicken broth this AM   Today, 07/12/2016:   Glucose - no h/o DM, CBGs worse after advancing TPN yesterday (Range 122-216; goal 100-150) On hydrocortisone 50 q8.    Electrolytes - K, Phos improved to wn lafter supplementation yesterday; other labs wnl except bicarb low overnight  Renal - stable wnl  LFTs - stable, albumin low   TGs - slightly elevated  Prealbumin - low/decreased  NUTRITIONAL GOALS                                                                                              RD recs (12/28): 115-125g protein, 2200-2400 kcal, 2.2L fluid/day  (+++There is currently a Psychologist, prison and probation services of Clinimix solution.  Pharmacy will use Clinimix product based on what we have available in stock+++)  Clinimix E 5/20 at a goal rate of 100 ml/hr daily + 20% fat emulsion at 10 ml/hr on MWF only to provide: 120g/day protein, 2318Kcal/day.  PLAN  If cyclic TPN will be needed for discharge, please allow at least 72 hrs to transition patient to 12-hr cycle and ensure tolerability  At 1800 today:  Will continue Clinimix E 5/20 at 70 ml/hr for now as CBGs elevated. Will be easier to advance to goal rate this way, but currently meeting 80-90% of nutritional goals.   20% fat emulsion at 10 ml/hr. Will switch to MWF if advancing Clinimix to goal.  Keeping daily for now for extra calories.  TPN to contain standard multivitamins and trace elements.  Continue CBG checks q4h, will advance to moderate SSI  BMP, Phos tomorrow AM  TPN lab panels on Mondays & Thursdays.  F/u daily.    Reuel Boom, PharmD, BCPS Pager: [phone] 07/12/2016, 12:16 PM

## 2016-07-12 NOTE — Progress Notes (Addendum)
[facility]  PROGRESS NOTE  [name] WYO:[id number] DOB: Dec 29, 1940 DOA: 06/24/2016 PCP: [name], MD   Brief HPI:   76 year old with stage IV adenocarcinoma, arthritis, BPH, DVT. He has diagnosis of stage IVB(T1b, N2, M1c) non-small cell lung cancer, adenocarcinoma with metastases to the brain diagnosed in October 2017. He was undergoing chemotherapy with Prembrolizomab and recent dx of recurrent SBO, PE on eliquis who was admitted on 12/15 with severe abdominal pain. He was assessed by surgery who recommended conservative management with IVF and NG decompression.  On 06/29/16 he developed nausea and projectile vomiting, decreased blood pressure. Noted to have gram-negative rod bacteremia. He was transferred to stepdown for further management and PCCM consulted.  PCCM asked to see on 12/20 when he became progressively hypotensive. CT imaging of abd raising concern for ischemia vs infectious colitis. New acute LE DVT in-spite of NOAC, which we had to stop 12/21 d/t colitis related GIB and hgb drop of almost 2 gms. Now s/p IVC filter. Surgery following and recommends against surgery and as of 12/27 he was tolerating a diet.  Had some gas pain on 12/27, failed NG but feels OK 12/28 with normal bowel sounds and passing gas Patient Yesterday again started having vomiting, general surgery was consulted and patient again kept nothing by mouth for possible surgical intervention.  Subjective    This morning patient  Denies abdominal pain. Albumin has improved 2.2 after starting TPN. General surgery has postponed surgical intervention at this time   Assessment/Plan:     1. Small bowel obstruction-Recurrent patient has again been seen by surgery and plan for surgical intervention Monday or Tuesday. In the meantime TNA has been started. 2. Colitis- seen on the CT abdomen ischemic versus infectious, patient was treated with Flagyl from  12/21 to 12/26. Ischemic colitis is suspected in  setting of small bowel obstruction.  3. Non-small cell lung cancer- patient on chemotherapy, followed by oncology as outpatient. 4. History of PE/DVT- patient was on eliquis, he developed new DVTs on NOAC, status post IVC filter placement. Patient was started on Xarelto which was held for possible surgery, but as no surgery has been postponed. Will restart Xarelto. 5. Bacteroides Vulgatus  bacteremia (beta lactamase +)- patient treated with antibiotics, currently off antibiotics. Repeat blood cultures on 06/29/2016 negative. 6. Diabetes insipidus- continue DDAVP, will cut down the dose of Solucortef to 25  mg po q 8 hr. 7. Anemia- hemoglobin is 7.3, dropped from 8.7 on 12/24, likely dilutional.this morning hemoglobin 11.7. 8. Diabetes mellitus- start sliding scale insulin with Novolog. 9. Hypotension- continue Midodrine 15 mg po tid.   DVT prophylaxis: Anti-coagulation with Xarelto  Code Status: Full code  Family Communication: No family present at bedside   Disposition Plan: To be decided, based on patient's condition after surgery.   Consultants:  General surgery  PCCM  Procedures: Venous US 12/21>>>Findings consistent with acute deep vein thrombosis involvingperoneal, popliteal, and distal femoral arteryof the right lowerextremity. - Findings consistent with acute deep vein thrombosis involving the popliteal and gastrocnemius veins of the left lower extremity. - Findings consistent with chronic deep vein thrombosis involving them proximal posterior tibial vein of the right lower extremity. - No evidence of a superficial thrombosis of the right or left lower extremity. CT abd/pelvis 12/20: Persistently dilated fluid and gas filled small bowel loops to level of the right lower quadrant/ distal ilium with abrupt transition without mass identified and therefore may be related to stricture/adhesion.Interval development of abnormal thickening of the  ascending colon and  proximal descending colon with surrounding inflammation of fat planes suggesting colitis whether inflammatory infectious or ischemic.  CULTURES: BCX2 12/15: bacteroides vulgatus BCX2 12/20>>> negative  . Marland KitchenTPN (CLINIMIX-E) Adult 70 mL/hr at 07/11/16 1713   And  . fat emulsion 240 mL (07/11/16 1714)  . Marland KitchenTPN (CLINIMIX-E) Adult     And  . fat emulsion    . sodium chloride 1,000 mL (07/11/16 1156)      Antibiotics:   Anti-infectives    Start     Dose/Rate Route Frequency Ordered Stop   06/30/16 0830  metroNIDAZOLE (FLAGYL) IVPB 500 mg  Status:  Discontinued     500 mg 100 mL/hr over 60 Minutes Intravenous Every 8 hours 06/30/16 0735 07/05/16 1045   06/29/16 1800  meropenem (MERREM) 1 g in sodium chloride 0.9 % 100 mL IVPB  Status:  Discontinued     1 g 200 mL/hr over 30 Minutes Intravenous Every 8 hours 06/29/16 1552 07/07/16 1126   06/29/16 1430  levofloxacin (LEVAQUIN) IVPB 750 mg  Status:  Discontinued     750 mg 100 mL/hr over 90 Minutes Intravenous Every 24 hours 06/29/16 1355 06/29/16 1543   06/25/16 1000  levofloxacin (LEVAQUIN) tablet 500 mg  Status:  Discontinued     500 mg Oral Daily 06/24/16 2026 06/25/16 0941       Objective   Vitals:   07/11/16 1246 07/11/16 2012 07/12/16 0445 07/12/16 1300  BP: 104/63 97/69 (!) 89/60 (!) 86/48  Pulse: 89 (!) 101 (!) 101 79  Resp: '18 18 16 18  '$ Temp: 98.7 F (37.1 C) 97.6 F (36.4 C) 97.9 F (36.6 C) 98.4 F (36.9 C)  TempSrc: Oral Oral Oral Oral  SpO2: 95% 93% 97% 96%  Weight:      Height:        Intake/Output Summary (Last 24 hours) at 07/12/16 1513 Last data filed at 07/12/16 1044  Gross per 24 hour  Intake             1760 ml  Output              950 ml  Net              810 ml   Filed Weights   07/07/16 0500 07/08/16 0500 07/09/16 0611  Weight: 95.8 kg (211 lb 3.2 oz) 91.1 kg (200 lb 13.4 oz) 90.6 kg (199 lb 11.8 oz)     Physical Examination:  General exam: Appears calm and comfortable. Respiratory  system: Clear to auscultation. Respiratory effort normal. Cardiovascular system:  RRR. No  murmurs, rubs, gallops. No pedal edema. GI system: Abdomen is nondistended, Mild tenderness to palpation in the left lower quadrant. No organomegaly.  Central nervous system. No focal neurological deficits. 5 x 5 power in all extremities. Skin: No rashes, lesions or ulcers. Psychiatry: Alert, oriented x 3.Judgement and insight appear normal. Affect normal.    Data Reviewed: I have personally reviewed following labs and imaging studies  CBG:  Recent Labs Lab 07/11/16 2010 07/12/16 0010 07/12/16 0430 07/12/16 0738 07/12/16 1143  GLUCAP 216* 179* 220* 180* 170*    CBC:  Recent Labs Lab 07/08/16 0545 07/09/16 1220 07/10/16 0608 07/11/16 0405 07/12/16 1332  WBC 10.7* 7.8 7.1 6.8 11.8*  NEUTROABS 9.9*  --  6.7 6.4  --   HGB 10.1* 8.4* 7.8* 7.3* 11.7*  HCT 30.8* 26.1* 24.3* 22.1* 34.9*  MCV 99.7 101.2* 101.3* 99.5 99.1  PLT 62* 51* 41* 54* 73*  Basic Metabolic Panel:  Recent Labs Lab 07/06/16 0500  07/08/16 0545 07/09/16 1220 07/10/16 0608 07/11/16 0405 07/12/16 0413  NA 133*  < > 145 143 140 139 136  K 4.0  < > 3.6 3.3* 3.8 3.3* 3.8  CL 99*  < > 107 107 108 107 105  CO2 29  < > '30 27 25 26 '$ 18*  GLUCOSE 100*  < > 127* 101* 133* 153* 229*  BUN 15  < > 17 22* '20 18 18  '$ CREATININE 0.67  < > 0.81 0.56* 0.56* 0.54* 0.77  CALCIUM 7.4*  < > 8.7* 7.5* 7.3* 7.5* 7.7*  MG 1.8  --   --  2.3 2.1 2.1 2.0  PHOS 2.5  --   --  2.7 1.6* 1.5* 2.6  < > = values in this interval not displayed.  No results found for this or any previous visit (from the past 240 hour(s)).    Cardiac Enzymes: No results for input(s): CKTOTAL, CKMB, CKMBINDEX, TROPONINI in the last 168 hours. BNP (last 3 results)  Recent Labs  06/05/16 2354 06/19/16 1210 06/24/16 2151  BNP 45.8 33.6 94.7    ProBNP (last 3 results) No results for input(s): PROBNP in the last 8760 hours.    Studies: No  results found.  Scheduled Meds: . cholecalciferol  1,000 Units Oral QHS  . desmopressin  0.1 mg Oral QHS  . hydrocortisone sod succinate (SOLU-CORTEF) inj  25 mg Intravenous Q8H  . insulin aspart  0-15 Units Subcutaneous Q4H  . mouth rinse  15 mL Mouth Rinse BID  . midodrine  15 mg Oral TID WC  . pantoprazole  40 mg Oral Q1200  . polyethylene glycol  17 g Oral Daily  . rivaroxaban  20 mg Oral Q supper  . sodium chloride flush  10-40 mL Intracatheter Q12H  . sodium chloride flush  3 mL Intravenous Q12H      Time spent: 25 min  [facility] Pager [phone]. If 7PM-7AM, please contact night-coverage at [email or web address], Office  [phone]  password [email or web address] 07/12/2016, 3:13 PM  LOS: 18 days

## 2016-07-12 NOTE — Progress Notes (Signed)
SBO  Subjective: Had a large BM yesterday.  Feeling very weak.    Objective: Vital signs in last 24 hours: Temp:  [97.6 F (36.4 C)-98.7 F (37.1 C)] 97.9 F (36.6 C) (01/02 0445) Pulse Rate:  [89-101] 101 (01/02 0445) Resp:  [16-18] 16 (01/02 0445) BP: (89-104)/(60-69) 89/60 (01/02 0445) SpO2:  [93 %-97 %] 97 % (01/02 0445) Last BM Date: 07/09/16  Intake/Output from previous day: 01/01 0701 - 01/02 0700 In: 2276.7 [P.O.:200; I.V.:1570; IV Piggyback:506.7] Out: 450 [Urine:200; Emesis/NG output:250] Intake/Output this shift: No intake/output data recorded.  General appearance: alert and cooperative GI: mildly distended,soft  Lab Results:  Results for orders placed or performed during the hospital encounter of 06/24/16 (from the past 24 hour(s))  Glucose, capillary     Status: Abnormal   Collection Time: 07/11/16 11:56 AM  Result Value Ref Range   Glucose-Capillary 122 (H) 65 - 99 mg/dL  Glucose, capillary     Status: Abnormal   Collection Time: 07/11/16  4:13 PM  Result Value Ref Range   Glucose-Capillary 152 (H) 65 - 99 mg/dL  Glucose, capillary     Status: Abnormal   Collection Time: 07/11/16  8:10 PM  Result Value Ref Range   Glucose-Capillary 216 (H) 65 - 99 mg/dL   Comment 1 Notify RN   Glucose, capillary     Status: Abnormal   Collection Time: 07/12/16 12:10 AM  Result Value Ref Range   Glucose-Capillary 179 (H) 65 - 99 mg/dL   Comment 1 Notify RN   Comprehensive metabolic panel     Status: Abnormal   Collection Time: 07/12/16  4:13 AM  Result Value Ref Range   Sodium 136 135 - 145 mmol/L   Potassium 3.8 3.5 - 5.1 mmol/L   Chloride 105 101 - 111 mmol/L   CO2 18 (L) 22 - 32 mmol/L   Glucose, Bld 229 (H) 65 - 99 mg/dL   BUN 18 6 - 20 mg/dL   Creatinine, Ser 0.77 0.61 - 1.24 mg/dL   Calcium 7.7 (L) 8.9 - 10.3 mg/dL   Total Protein 5.1 (L) 6.5 - 8.1 g/dL   Albumin 2.2 (L) 3.5 - 5.0 g/dL   AST 45 (H) 15 - 41 U/L   ALT 27 17 - 63 U/L   Alkaline Phosphatase  97 38 - 126 U/L   Total Bilirubin 0.7 0.3 - 1.2 mg/dL   GFR calc non Af Amer >60 >60 mL/min   GFR calc Af Amer >60 >60 mL/min   Anion gap 13 5 - 15  Magnesium     Status: None   Collection Time: 07/12/16  4:13 AM  Result Value Ref Range   Magnesium 2.0 1.7 - 2.4 mg/dL  Phosphorus     Status: None   Collection Time: 07/12/16  4:13 AM  Result Value Ref Range   Phosphorus 2.6 2.5 - 4.6 mg/dL  Glucose, capillary     Status: Abnormal   Collection Time: 07/12/16  4:30 AM  Result Value Ref Range   Glucose-Capillary 220 (H) 65 - 99 mg/dL   Comment 1 Notify RN   Glucose, capillary     Status: Abnormal   Collection Time: 07/12/16  7:38 AM  Result Value Ref Range   Glucose-Capillary 180 (H) 65 - 99 mg/dL   *Note: Due to a large number of results and/or encounters for the requested time period, some results have not been displayed. A complete set of results can be found in Results Review.  Studies/Results Radiology     MEDS, Scheduled . cholecalciferol  1,000 Units Oral QHS  . desmopressin  0.1 mg Oral QHS  . hydrocortisone sod succinate (SOLU-CORTEF) inj  50 mg Intravenous Q8H  . insulin aspart  0-9 Units Subcutaneous Q4H  . mouth rinse  15 mL Mouth Rinse BID  . midodrine  15 mg Oral TID WC  . pantoprazole  40 mg Oral Q1200  . polyethylene glycol  17 g Oral Daily  . simethicone  80 mg Oral QID  . sodium chloride flush  10-40 mL Intracatheter Q12H  . sodium chloride flush  3 mL Intravenous Q12H     Assessment: SBO (small bowel obstruction) Non-severe (moderate) malnutrition  Plan: Pt too debilitated for surgery right now.  Cont TPN.  Will try some clears today since he is having bowel function.  If he can tolerate enough full liquids to come off TPN, will plan on d/c to SNF for rehab prior to surgery.  If not, will plan on d/c to SNF with TPN.  Will then monitor nutrition as an outpatient and plan on operation once albumin closer to 3.     LOS: 18 days    Rosario Adie, MD [facility], [place]   07/12/2016 7:57 AM

## 2016-07-12 NOTE — Progress Notes (Signed)
CSW met with pt / family at bedside. Pt is hoping to have rehab at [facility] but will consider SNF if CIR is unable to assist. SNF search has been initiated and bed offers pending. CSW will remain available to assist with d/c planning as needed.  Werner Lean LCSW [phone]

## 2016-07-12 NOTE — Progress Notes (Signed)
Patient vomited 600 ccs of browon liquid after lunch. Zofran given with good effect. Pt comfortable

## 2016-07-12 NOTE — Progress Notes (Signed)
[place] for Xarelto Indication: Hx DVT/PE, acute PE with suspected HIT, acute DVT  Allergies  Allergen Reactions  . Tramadol Palpitations    "Made my chest feel tight"  . Amoxicillin     REACTION: rash  . Penicillins Rash    Has patient had a PCN reaction causing immediate rash, facial/tongue/throat swelling, SOB or lightheadedness with hypotension: unknown Has patient had a PCN reaction causing severe rash involving mucus membranes or skin necrosis: unknown Has patient had a PCN reaction that required hospitalization: no  Has patient had a PCN reaction occurring within the last 10 years: no  If all of the above answers are "NO", then may proceed with Cephalosporin use.     Patient Measurements: Height: '5\' 9"'$  (175.3 cm) Weight: 199 lb 11.8 oz (90.6 kg) IBW/kg (Calculated) : 70.7   Vital Signs: Temp: 97.9 F (36.6 C) (01/02 0445) Temp Source: Oral (01/02 0445) BP: 89/60 (01/02 0445) Pulse Rate: 101 (01/02 0445)  Labs:  Recent Labs  07/09/16 1220 07/10/16 0608 07/11/16 0405 07/12/16 0413  HGB 8.4* 7.8* 7.3*  --   HCT 26.1* 24.3* 22.1*  --   PLT 51* 41* 54*  --   CREATININE 0.56* 0.56* 0.54* 0.77    Estimated Creatinine Clearance: 88.8 mL/min (by C-G formula based on SCr of 0.77 mg/dL).   Medications:  Scheduled:  . cholecalciferol  1,000 Units Oral QHS  . desmopressin  0.1 mg Oral QHS  . hydrocortisone sod succinate (SOLU-CORTEF) inj  25 mg Intravenous Q8H  . insulin aspart  0-9 Units Subcutaneous Q4H  . mouth rinse  15 mL Mouth Rinse BID  . midodrine  15 mg Oral TID WC  . pantoprazole  40 mg Oral Q1200  . polyethylene glycol  17 g Oral Daily  . sodium chloride flush  10-40 mL Intracatheter Q12H  . sodium chloride flush  3 mL Intravenous Q12H   Infusions:  . Marland KitchenTPN (CLINIMIX-E) Adult 70 mL/hr at 07/11/16 1713   And  . fat emulsion 240 mL (07/11/16 1714)  . sodium chloride 1,000 mL (07/11/16 1156)     Assessment: 21 yoM admitted on 12/15 on apixaban with new PE.  Pharmacy was initially consulted to dose argatroban IV due to concern for HIT. PMH is significant for metastatic lung cancer and chronic anticoagulation for history of PE/DVT.   Significant events:  12/15 Started argatroban IV  12/19 Transitioned from argatroban to Apixaban  12/21 Apixaban held d/t bloody BM and ABLA.  Dopplers showed acute bilateral DVT.  IR placed a right IJ retrievable IVC filter.  12/27 Pharmacy is consulted to resume anticoagulation with rivaroxaban (instead of apixaban d/t insurance coverage)   12/29 UE dopplers neg for thrombosis  12/30 Xarelto held for surgery, possibly 1/1-1/2  1/2: per Surgery, too debilitated for procedure; resume Xarelto  Today, 07/12/2016: SCr wnl; CrCl 89 ml/min CBC remains poor with Hgb dropping, now 7.3; Plt stable around 40-50; however no bleeding concerns per RN  Goal of Therapy:  Prevent VTE recurrence/worsening   Plan:   Resume Xarelto 20 mg PO once daily with supper  Continue to monitor renal function, CBC closely  Reuel Boom, PharmD, BCPS Pager: [phone] 07/12/2016, 10:25 AM

## 2016-07-12 NOTE — Progress Notes (Signed)
Nutrition Follow-up  DOCUMENTATION CODES:   Non-severe (moderate) malnutrition in context of chronic illness  INTERVENTION:   Monitor magnesium, potassium, and phosphorus daily for at least 3 days, MD to replete as needed, as pt is at risk for refeeding syndrome given malnutrition, poor PO intake since November and low lab values at admission.  TPN per Pharmacy Encourage sips of clears as tolerated.  RD will continue to follow.  NUTRITION DIAGNOSIS:   Malnutrition related to altered GI function, cancer and cancer related treatments as evidenced by moderate depletions of muscle mass, severe fluid accumulation, energy intake < or equal to 75% for > or equal to 1 month.  Ongoing.  GOAL:   Patient will meet greater than or equal to 90% of their needs  Progressing.  MONITOR:   PO intake, Labs, Weight trends, Skin, I & O's, Other (Comment) (TPN)  ASSESSMENT:   33 yoM with recent admission 11/26-12/4 for SBO, re-admitted on 12/15 with PE and recurrent SBO. PMH is significant for metastatic lung cancer (immunotherapy with Nat Math on hold) and chronic anticoagulation for history of PE/DVT s/p IVC filter placement.  Surgery reconsulted 12/29 because SBO not improving.  Unable to tolerate fluids, continued N/V.  NG tube placement failed multiple times.  Pt to proceed with surgery once Xarelto cleared.  Xarelto d/c'd 12/29 and surgery wants to wait 3 days for clearance.  TPN to start 12/30.75 yoM with recent admission 11/26-12/4 for SBO, re-admitted on 12/15 with PE and recurrent SBO. PMH is significant for metastatic lung cancer (immunotherapy with Nat Math on hold) and chronic anticoagulation for history of PE/DVT s/p IVC filter placement.  Surgery reconsulted 12/29 because SBO not improving.  Unable to tolerate fluids, continued N/V.  NG tube placement failed multiple times.  Pt to proceed with surgery once Xarelto cleared.  Xarelto d/c'd 12/29 and surgery wants to wait 3 days for clearance.   TPN to start 12/30.  Pt in room with wife at bedside. At time of visit, pt state he has been sipping on ice tea, ginger ale and was working on some chicken broth. Discussed protein supplement options with pt and pt's wife. Pt was drinking ensure supplements at home PTA. Would like those when diet is advanced to full liquids.   Once RD left the room, pt's wife came to talk to RD and state he was vomiting. RN states pt vomited up most of his liquids he consumed.   TPN: Clinimix E 5/20 @ 70 ml/hr w/ ILE ate 10 ml/hr, providing 1958 kcal(91% of needs) and 84g protein (73% of needs).  Per discussion with Pharmacy, will leave TPN at current rate and control CBGs. On 1/3: will advance to goal (Clinimix E 5/20 at a goal rate of 100 ml/hr daily + 20% fat emulsion at 10 ml/hr on MWF only to provide: 120g/day protein, 2318Kcal/day) in preparation to cycle TPN.  Medications: Vitamin D tablet daily, Protonix tablet daily Labs reviewed: CBGs: 170-220 Mg/Phos/K WNL  Plan per Pharmacy 1/2: Note pending.  Diet Order:  .TPN (CLINIMIX-E) Adult Diet clear liquid Room service appropriate? Yes; Fluid consistency: Thin  Skin:  Wound (see comment) (foot wound )  Last BM:  1/1  Height:   Ht Readings from Last 1 Encounters:  07/08/16 '5\' 9"'$  (1.753 m)    Weight:   Wt Readings from Last 1 Encounters:  07/09/16 199 lb 11.8 oz (90.6 kg)    Ideal Body Weight:  72.7 kg  BMI:  Body mass index is 29.5 kg/m.  Estimated Nutritional Needs:   Kcal:  2150-2350  Protein:  115-125g  Fluid:  2.2L/day  EDUCATION NEEDS:   Education needs addressed  Clayton Bibles, MS, RD, LDN Pager: [phone] After Hours Pager: [phone]

## 2016-07-12 NOTE — Progress Notes (Signed)
Occupational Therapy Treatment Patient Details Name: [name] MRN: [id number] DOB: [birth year] Today's Date: 07/12/2016    History of present illness [name] is a 76 y.o. male with medical history significant of recently diagnosed stage IV NSCLC (80% PDL1+) being treated with pembrolizomab [name], immunotherapy), recent SBO (thought to be due to adhesions), DVT, multifocal PE (with possible HIT) on Eliquis, Gerd, multiple myeloma with skull lesion (s/p of radiation therapy 2016), chronic prostatitis, BPH, dCHF, diabetes insipidus on DDAVP, who presents with shortness of breath abdominal pain.   OT comments  Pt very motivated and increased activity tolerance; but BPs still dropping. See ADL comments.  Tolerated ADL and theraband exercises today.  Pt very motivated and hopes to go to CIR.    Follow Up Recommendations  CIR    Equipment Recommendations  3 in 1 bedside commode    Recommendations for Other Services      Precautions / Restrictions Precautions Precautions: Fall Precaution Comments: monitor BP, hypotensive Restrictions Weight Bearing Restrictions: No       Mobility Bed Mobility           Sit to supine: Mod assist   General bed mobility comments: assist for bil LEs  Transfers Overall transfer level: Needs assistance Equipment used: Rolling walker (2 wheeled) Transfers: Sit to/from Stand Sit to Stand: Min assist;+2 safety/equipment Stand pivot transfers: Min assist;+2 safety/equipment       General transfer comment: assist to rise and stabilize.      Balance                                   ADL       Grooming: Set up;Wash/dry hands;Sitting   Upper Body Bathing: Minimal assistance;Sitting   Lower Body Bathing: Moderate assistance;Sit to/from stand           Toilet Transfer: Minimal assistance;+2 for physical assistance;Stand-pivot;RW (back to bed)             General ADL Comments: pt performed ADL from  chair.  Decreased BP in sitting 95/74; 69/46; 86/47; 86/51.  when back to bed 100/64.  Did not get a standing BP.  Performed level one theraband exercises when back in bed.  Pt's began to perform theraband exercises but could not complete last one due to stomach cramping      Vision                     Perception     Praxis      Cognition   Behavior During Therapy: [facility] for tasks assessed/performed Overall Cognitive Status: Within Functional Limits for tasks assessed                  General Comments: reinforcement for UE exercise program    Extremity/Trunk Assessment               Exercises     Shoulder Instructions       General Comments      Pertinent Vitals/ Pain       Pain Assessment: Faces Faces Pain Scale: Hurts little more Pain Location: stomach cramping Pain Descriptors / Indicators: Cramping Pain Intervention(s): Limited activity within patient's tolerance;Monitored during session;Repositioned  Home Living  Prior Functioning/Environment              Frequency  Min 2X/week        Progress Toward Goals  OT Goals(current goals can now be found in the care plan section)  Progress towards OT goals: Progressing toward goals     Plan      Co-evaluation                 End of Session     Activity Tolerance     Patient Left in bed;with call bell/phone within reach;with bed alarm set   Nurse Communication          Time: [phone] OT Time Calculation (min): 41 min  Charges: OT General Charges $OT Visit: 1 Procedure OT Treatments $Self Care/Home Management : 23-37 mins $Therapeutic Activity: 8-22 mins  Jahzaria Vary 07/12/2016, 2:48 PM Lesle Chris, OTR/L [phone] 07/12/2016

## 2016-07-13 ENCOUNTER — Inpatient Hospital Stay (HOSPITAL_COMMUNITY)
Admission: EM | Admit: 2016-07-13 | Discharge: 2016-07-29 | Disposition: A | Discharge status: Home / Self Care | Payer: Medicare Other | Source: Ambulatory Visit | Attending: Internal Medicine | Admitting: Internal Medicine

## 2016-07-13 ENCOUNTER — Inpatient Hospital Stay (HOSPITAL_COMMUNITY)
Admission: RE | Admit: 2016-07-13 | Discharge: 2016-07-13 | DRG: 189 | Disposition: A | Discharge status: Other Acute Inpatient Hospital | Payer: Medicare Other | Source: Other Acute Inpatient Hospital | Attending: Pulmonary Disease | Admitting: Pulmonary Disease

## 2016-07-13 ENCOUNTER — Encounter (HOSPITAL_COMMUNITY): Payer: Self-pay

## 2016-07-13 ENCOUNTER — Inpatient Hospital Stay (HOSPITAL_COMMUNITY): Payer: Medicare Other

## 2016-07-13 ENCOUNTER — Encounter: Payer: Self-pay | Admitting: *Deleted

## 2016-07-13 DIAGNOSIS — Z8042 Family history of malignant neoplasm of prostate: Secondary | ICD-10-CM

## 2016-07-13 DIAGNOSIS — J9601 Acute respiratory failure with hypoxia: Secondary | ICD-10-CM | POA: Diagnosis present

## 2016-07-13 DIAGNOSIS — K56609 Unspecified intestinal obstruction, unspecified as to partial versus complete obstruction: Secondary | ICD-10-CM

## 2016-07-13 DIAGNOSIS — J9690 Respiratory failure, unspecified, unspecified whether with hypoxia or hypercapnia: Secondary | ICD-10-CM

## 2016-07-13 DIAGNOSIS — Z7189 Other specified counseling: Secondary | ICD-10-CM

## 2016-07-13 DIAGNOSIS — J189 Pneumonia, unspecified organism: Secondary | ICD-10-CM

## 2016-07-13 DIAGNOSIS — E46 Unspecified protein-calorie malnutrition: Secondary | ICD-10-CM | POA: Diagnosis present

## 2016-07-13 DIAGNOSIS — Z09 Encounter for follow-up examination after completed treatment for conditions other than malignant neoplasm: Secondary | ICD-10-CM

## 2016-07-13 DIAGNOSIS — I2699 Other pulmonary embolism without acute cor pulmonale: Secondary | ICD-10-CM

## 2016-07-13 DIAGNOSIS — Z86711 Personal history of pulmonary embolism: Secondary | ICD-10-CM | POA: Diagnosis present

## 2016-07-13 DIAGNOSIS — Z833 Family history of diabetes mellitus: Secondary | ICD-10-CM

## 2016-07-13 DIAGNOSIS — C3490 Malignant neoplasm of unspecified part of unspecified bronchus or lung: Secondary | ICD-10-CM

## 2016-07-13 DIAGNOSIS — I824Z3 Acute embolism and thrombosis of unspecified deep veins of distal lower extremity, bilateral: Secondary | ICD-10-CM | POA: Diagnosis present

## 2016-07-13 DIAGNOSIS — E8809 Other disorders of plasma-protein metabolism, not elsewhere classified: Secondary | ICD-10-CM | POA: Diagnosis present

## 2016-07-13 DIAGNOSIS — Z85820 Personal history of malignant melanoma of skin: Secondary | ICD-10-CM

## 2016-07-13 DIAGNOSIS — D638 Anemia in other chronic diseases classified elsewhere: Secondary | ICD-10-CM

## 2016-07-13 DIAGNOSIS — E232 Diabetes insipidus: Secondary | ICD-10-CM | POA: Diagnosis present

## 2016-07-13 DIAGNOSIS — C7931 Secondary malignant neoplasm of brain: Secondary | ICD-10-CM | POA: Diagnosis present

## 2016-07-13 DIAGNOSIS — Z8719 Personal history of other diseases of the digestive system: Secondary | ICD-10-CM

## 2016-07-13 DIAGNOSIS — D500 Iron deficiency anemia secondary to blood loss (chronic): Secondary | ICD-10-CM

## 2016-07-13 DIAGNOSIS — C3491 Malignant neoplasm of unspecified part of right bronchus or lung: Secondary | ICD-10-CM | POA: Diagnosis present

## 2016-07-13 DIAGNOSIS — K219 Gastro-esophageal reflux disease without esophagitis: Secondary | ICD-10-CM | POA: Diagnosis present

## 2016-07-13 DIAGNOSIS — R5381 Other malaise: Secondary | ICD-10-CM | POA: Diagnosis present

## 2016-07-13 DIAGNOSIS — N400 Enlarged prostate without lower urinary tract symptoms: Secondary | ICD-10-CM | POA: Diagnosis present

## 2016-07-13 DIAGNOSIS — T17910A Gastric contents in respiratory tract, part unspecified causing asphyxiation, initial encounter: Secondary | ICD-10-CM

## 2016-07-13 DIAGNOSIS — T380X5A Adverse effect of glucocorticoids and synthetic analogues, initial encounter: Secondary | ICD-10-CM | POA: Diagnosis present

## 2016-07-13 DIAGNOSIS — Z7952 Long term (current) use of systemic steroids: Secondary | ICD-10-CM

## 2016-07-13 DIAGNOSIS — R1084 Generalized abdominal pain: Secondary | ICD-10-CM

## 2016-07-13 DIAGNOSIS — I959 Hypotension, unspecified: Secondary | ICD-10-CM | POA: Diagnosis present

## 2016-07-13 DIAGNOSIS — Z885 Allergy status to narcotic agent status: Secondary | ICD-10-CM

## 2016-07-13 DIAGNOSIS — T17918A Gastric contents in respiratory tract, part unspecified causing other injury, initial encounter: Secondary | ICD-10-CM | POA: Diagnosis present

## 2016-07-13 DIAGNOSIS — L8990 Pressure ulcer of unspecified site, unspecified stage: Secondary | ICD-10-CM | POA: Insufficient documentation

## 2016-07-13 DIAGNOSIS — R52 Pain, unspecified: Secondary | ICD-10-CM

## 2016-07-13 DIAGNOSIS — R600 Localized edema: Secondary | ICD-10-CM | POA: Diagnosis present

## 2016-07-13 DIAGNOSIS — D62 Acute posthemorrhagic anemia: Secondary | ICD-10-CM

## 2016-07-13 DIAGNOSIS — R609 Edema, unspecified: Secondary | ICD-10-CM | POA: Diagnosis present

## 2016-07-13 DIAGNOSIS — Z87891 Personal history of nicotine dependence: Secondary | ICD-10-CM | POA: Diagnosis not present

## 2016-07-13 DIAGNOSIS — Z8249 Family history of ischemic heart disease and other diseases of the circulatory system: Secondary | ICD-10-CM

## 2016-07-13 DIAGNOSIS — E876 Hypokalemia: Secondary | ICD-10-CM

## 2016-07-13 DIAGNOSIS — Z880 Allergy status to penicillin: Secondary | ICD-10-CM

## 2016-07-13 DIAGNOSIS — Z923 Personal history of irradiation: Secondary | ICD-10-CM | POA: Diagnosis not present

## 2016-07-13 DIAGNOSIS — M1711 Unilateral primary osteoarthritis, right knee: Secondary | ICD-10-CM | POA: Diagnosis present

## 2016-07-13 DIAGNOSIS — C9000 Multiple myeloma not having achieved remission: Secondary | ICD-10-CM | POA: Diagnosis present

## 2016-07-13 DIAGNOSIS — Z79899 Other long term (current) drug therapy: Secondary | ICD-10-CM

## 2016-07-13 DIAGNOSIS — Z7901 Long term (current) use of anticoagulants: Secondary | ICD-10-CM

## 2016-07-13 DIAGNOSIS — I82409 Acute embolism and thrombosis of unspecified deep veins of unspecified lower extremity: Secondary | ICD-10-CM | POA: Diagnosis present

## 2016-07-13 DIAGNOSIS — D696 Thrombocytopenia, unspecified: Secondary | ICD-10-CM

## 2016-07-13 DIAGNOSIS — X58XXXA Exposure to other specified factors, initial encounter: Secondary | ICD-10-CM | POA: Diagnosis present

## 2016-07-13 DIAGNOSIS — Z789 Other specified health status: Secondary | ICD-10-CM

## 2016-07-13 DIAGNOSIS — Z823 Family history of stroke: Secondary | ICD-10-CM | POA: Diagnosis not present

## 2016-07-13 DIAGNOSIS — Z515 Encounter for palliative care: Secondary | ICD-10-CM

## 2016-07-13 DIAGNOSIS — R739 Hyperglycemia, unspecified: Secondary | ICD-10-CM | POA: Diagnosis present

## 2016-07-13 DIAGNOSIS — T17908A Unspecified foreign body in respiratory tract, part unspecified causing other injury, initial encounter: Secondary | ICD-10-CM | POA: Diagnosis present

## 2016-07-13 LAB — GLUCOSE, CAPILLARY
GLUCOSE-CAPILLARY: 165 mg/dL — AB (ref 65–99)
Glucose-Capillary: 123 mg/dL — ABNORMAL HIGH (ref 65–99)
Glucose-Capillary: 122 mg/dL — ABNORMAL HIGH (ref 65–99)
Glucose-Capillary: 138 mg/dL — ABNORMAL HIGH (ref 65–99)
Glucose-Capillary: 205 mg/dL — ABNORMAL HIGH (ref 65–99)

## 2016-07-13 LAB — BASIC METABOLIC PANEL
Anion gap: 8 (ref 5–15)
BUN: 23 mg/dL — ABNORMAL HIGH (ref 6–20)
CALCIUM: 8.2 mg/dL — AB (ref 8.9–10.3)
CO2: 27 mmol/L (ref 22–32)
Chloride: 112 mmol/L — ABNORMAL HIGH (ref 101–111)
Creatinine, Ser: 0.81 mg/dL (ref 0.61–1.24)
GLUCOSE: 152 mg/dL — AB (ref 65–99)
Potassium: 3.4 mmol/L — ABNORMAL LOW (ref 3.5–5.1)
SODIUM: 147 mmol/L — AB (ref 135–145)

## 2016-07-13 LAB — PHOSPHORUS: PHOSPHORUS: 3.1 mg/dL (ref 2.5–4.6)

## 2016-07-13 MED ORDER — PROCHLORPERAZINE EDISYLATE 5 MG/ML IJ SOLN
5.0000 mg | Freq: Four times a day (QID) | INTRAMUSCULAR | Status: DC | PRN
Start: 2016-07-13 — End: 2016-07-13

## 2016-07-13 MED ORDER — METRONIDAZOLE IN NACL 5-0.79 MG/ML-% IV SOLN: 500.0000 mg | Freq: Three times a day (TID) | INTRAVENOUS | Status: DC

## 2016-07-13 MED ORDER — SODIUM CHLORIDE 0.9% FLUSH: 10.0000 mL | INTRAVENOUS | Status: DC | PRN

## 2016-07-13 MED ORDER — PANTOPRAZOLE SODIUM 40 MG IV SOLR: 40.0000 mg | Freq: Two times a day (BID) | INTRAVENOUS | Status: DC

## 2016-07-13 MED ORDER — INSULIN ASPART 100 UNIT/ML ~~LOC~~ SOLN: 0.0000 [IU] | SUBCUTANEOUS | 11 refills | Status: DC

## 2016-07-13 MED ORDER — TRACE MINERALS CR-CU-MN-SE-ZN 10-1000-500-60 MCG/ML IV SOLN
INTRAVENOUS | Status: DC
  Administered 2016-07-13: 18:00:00 via INTRAVENOUS
  Filled 2016-07-13: qty 1940

## 2016-07-13 MED ORDER — RIVAROXABAN 20 MG PO TABS: 20.0000 mg | ORAL_TABLET | Freq: Every day | ORAL | Status: DC

## 2016-07-13 MED ORDER — DESMOPRESSIN ACETATE SPRAY 0.01 % NA SOLN
10.0000 ug | Freq: Every day | NASAL | Status: DC
  Administered 2016-07-14 – 2016-07-28 (×16): 10 ug via NASAL
  Filled 2016-07-13 (×5): qty 5

## 2016-07-13 MED ORDER — PROCHLORPERAZINE EDISYLATE 5 MG/ML IJ SOLN: 5.0000 mg | Freq: Four times a day (QID) | INTRAMUSCULAR | Status: DC | PRN

## 2016-07-13 MED ORDER — ACETAMINOPHEN 325 MG PO TABS: 325.0000 mg | ORAL_TABLET | ORAL | Status: DC | PRN

## 2016-07-13 MED ORDER — DEXTROSE 5 % IV SOLN
1.0000 g | INTRAVENOUS | Status: DC
  Administered 2016-07-13: 1 g via INTRAVENOUS
  Filled 2016-07-13: qty 10

## 2016-07-13 MED ORDER — PROCHLORPERAZINE EDISYLATE 5 MG/ML IJ SOLN
5.0000 mg | Freq: Four times a day (QID) | INTRAMUSCULAR | Status: DC | PRN
  Administered 2016-07-13: 5 mg via INTRAVENOUS
  Filled 2016-07-13: qty 2

## 2016-07-13 MED ORDER — TEMAZEPAM 7.5 MG PO CAPS: 15.0000 mg | ORAL_CAPSULE | Freq: Every evening | ORAL | Status: DC | PRN

## 2016-07-13 MED ORDER — METRONIDAZOLE IN NACL 5-0.79 MG/ML-% IV SOLN
500.0000 mg | Freq: Three times a day (TID) | INTRAVENOUS | Status: DC
  Administered 2016-07-13 – 2016-07-21 (×22): 500 mg via INTRAVENOUS
  Filled 2016-07-13 (×23): qty 100

## 2016-07-13 MED ORDER — DEXAMETHASONE 4 MG PO TABS: 4.0000 mg | ORAL_TABLET | Freq: Two times a day (BID) | ORAL | Status: DC

## 2016-07-13 MED ORDER — DEXAMETHASONE SODIUM PHOSPHATE 4 MG/ML IJ SOLN
4.0000 mg | Freq: Two times a day (BID) | INTRAMUSCULAR | Status: DC
  Administered 2016-07-13 – 2016-07-26 (×27): 4 mg via INTRAVENOUS
  Filled 2016-07-13 (×27): qty 1

## 2016-07-13 MED ORDER — PANTOPRAZOLE SODIUM 40 MG IV SOLR
40.0000 mg | INTRAVENOUS | Status: DC
Start: 2016-07-13 — End: 2016-07-13

## 2016-07-13 MED ORDER — PROCHLORPERAZINE MALEATE 5 MG PO TABS: 5.0000 mg | ORAL_TABLET | Freq: Four times a day (QID) | ORAL | Status: DC | PRN

## 2016-07-13 MED ORDER — ALUM & MAG HYDROXIDE-SIMETH 200-200-20 MG/5ML PO SUSP: 30.0000 mL | ORAL | Status: DC | PRN

## 2016-07-13 MED ORDER — ACETAMINOPHEN 500 MG PO TABS: 500.0000 mg | ORAL_TABLET | Freq: Four times a day (QID) | ORAL | Status: DC | PRN

## 2016-07-13 MED ORDER — FAT EMULSION 20 % IV EMUL: 240.0000 mL | INTRAVENOUS | Status: DC

## 2016-07-13 MED ORDER — MORPHINE SULFATE (PF) 4 MG/ML IV SOLN
4.0000 mg | INTRAVENOUS | Status: DC | PRN
  Administered 2016-07-13: 4 mg via INTRAVENOUS
  Filled 2016-07-13: qty 1

## 2016-07-13 MED ORDER — TRACE MINERALS CR-CU-MN-SE-ZN 10-1000-500-60 MCG/ML IV SOLN
INTRAVENOUS | Status: DC
  Filled 2016-07-13: qty 1992

## 2016-07-13 MED ORDER — DESMOPRESSIN ACETATE 0.1 MG PO TABS
0.1000 mg | ORAL_TABLET | Freq: Every day | ORAL | Status: DC
Start: 2016-07-13 — End: 2016-07-13
  Filled 2016-07-13: qty 1

## 2016-07-13 MED ORDER — DEXAMETHASONE SODIUM PHOSPHATE 4 MG/ML IJ SOLN: 4.0000 mg | Freq: Two times a day (BID) | INTRAMUSCULAR | Status: DC

## 2016-07-13 MED ORDER — BISACODYL 10 MG RE SUPP: 10.0000 mg | Freq: Every day | RECTAL | Status: DC | PRN

## 2016-07-13 MED ORDER — FAT EMULSION 20 % IV EMUL
240.0000 mL | INTRAVENOUS | Status: DC
  Filled 2016-07-13 (×2): qty 250

## 2016-07-13 MED ORDER — PROCHLORPERAZINE 25 MG RE SUPP: 12.5000 mg | Freq: Four times a day (QID) | RECTAL | Status: DC | PRN

## 2016-07-13 MED ORDER — DIPHENHYDRAMINE HCL 12.5 MG/5ML PO ELIX: 12.5000 mg | ORAL_SOLUTION | Freq: Four times a day (QID) | ORAL | Status: DC | PRN

## 2016-07-13 MED ORDER — ACETAMINOPHEN 325 MG PO TABS: 650.0000 mg | ORAL_TABLET | Freq: Four times a day (QID) | ORAL | Status: AC | PRN

## 2016-07-13 MED ORDER — DEXTROSE 5 % IV SOLN
1.0000 g | INTRAVENOUS | Status: DC
  Filled 2016-07-13: qty 10

## 2016-07-13 MED ORDER — INSULIN ASPART 100 UNIT/ML ~~LOC~~ SOLN: 0.0000 [IU] | SUBCUTANEOUS | Status: DC

## 2016-07-13 MED ORDER — FAT EMULSION 20 % IV EMUL
250.0000 mL | INTRAVENOUS | Status: DC
  Administered 2016-07-13: 250 mL via INTRAVENOUS
  Filled 2016-07-13 (×2): qty 250

## 2016-07-13 MED ORDER — POLYETHYLENE GLYCOL 3350 17 G PO PACK: 17.0000 g | PACK | Freq: Every day | ORAL | Status: DC | PRN

## 2016-07-13 MED ORDER — TRACE MINERALS CR-CU-MN-SE-ZN 10-1000-500-60 MCG/ML IV SOLN
INTRAVENOUS | Status: DC
  Filled 2016-07-13: qty 2000

## 2016-07-13 MED ORDER — HEPARIN (PORCINE) IN NACL 100-0.45 UNIT/ML-% IJ SOLN: 1450.0000 [IU]/h | INTRAMUSCULAR | Status: DC

## 2016-07-13 MED ORDER — FENTANYL CITRATE (PF) 100 MCG/2ML IJ SOLN: 25.0000 ug | INTRAMUSCULAR | Status: DC | PRN

## 2016-07-13 MED ORDER — SODIUM CHLORIDE 0.9 % IV SOLN
8.0000 mg | Freq: Four times a day (QID) | INTRAVENOUS | Status: DC | PRN
  Filled 2016-07-13: qty 4

## 2016-07-13 MED ORDER — GUAIFENESIN-DM 100-10 MG/5ML PO SYRP: 5.0000 mL | ORAL_SOLUTION | Freq: Four times a day (QID) | ORAL | Status: DC | PRN

## 2016-07-13 MED ORDER — ALBUTEROL SULFATE (2.5 MG/3ML) 0.083% IN NEBU: 2.5000 mg | INHALATION_SOLUTION | RESPIRATORY_TRACT | Status: DC | PRN

## 2016-07-13 MED ORDER — TRACE MINERALS CR-CU-MN-SE-ZN 10-1000-500-60 MCG/ML IV SOLN: INTRAVENOUS | Status: DC

## 2016-07-13 MED ORDER — SODIUM CHLORIDE 0.9% FLUSH: 10.0000 mL | Freq: Two times a day (BID) | INTRAVENOUS | Status: DC

## 2016-07-13 MED ORDER — SODIUM CHLORIDE 0.9 % IV SOLN: 250.0000 mL | INTRAVENOUS | Status: DC | PRN

## 2016-07-13 MED ORDER — FLEET ENEMA 7-19 GM/118ML RE ENEM: 1.0000 | ENEMA | Freq: Once | RECTAL | Status: DC | PRN

## 2016-07-13 MED ORDER — TRACE MINERALS CR-CU-MN-SE-ZN 10-1000-500-60 MCG/ML IV SOLN
INTRAVENOUS | Status: AC
  Filled 2016-07-13: qty 1940

## 2016-07-13 MED ORDER — ARGATROBAN 50 MG/50ML IV SOLN
1.0000 ug/kg/min | INTRAVENOUS | Status: AC
  Administered 2016-07-13 – 2016-07-14 (×2): 2 ug/kg/min via INTRAVENOUS
  Administered 2016-07-14 (×2): 1 ug/kg/min via INTRAVENOUS
  Administered 2016-07-14: 2 ug/kg/min via INTRAVENOUS
  Administered 2016-07-15 – 2016-07-18 (×13): 1 ug/kg/min via INTRAVENOUS
  Filled 2016-07-13 (×18): qty 50

## 2016-07-13 MED ORDER — FAT EMULSION 20 % IV EMUL
240.0000 mL | INTRAVENOUS | Status: DC
  Filled 2016-07-13: qty 250

## 2016-07-13 MED ORDER — MIDODRINE HCL 5 MG PO TABS: 15.0000 mg | ORAL_TABLET | Freq: Three times a day (TID) | ORAL | Status: DC

## 2016-07-13 MED ORDER — FAT EMULSION 20 % IV EMUL
250.0000 mL | INTRAVENOUS | Status: AC
  Filled 2016-07-13 (×2): qty 250

## 2016-07-13 NOTE — Progress Notes (Signed)
RT called to patient room due to patient having possible aspiration.  Upon arrival, patient was noted to be gray in color, sats were reading 80% and heart rate was reading 150s.  Placed patient on non-rebreather mask.  MD was paged and ordered chest xray.  Sats improved to 100%.  Patient began to feel more comfortable once placed on non-rebreather.  Heart rate still slightly elevated in 130s-140s.  Will continue to monitor patient.

## 2016-07-13 NOTE — Progress Notes (Signed)
SBO  Subjective: Having some bowel function.  Unable to tolerate clears well   Objective: Vital signs in last 24 hours: Temp:  [98 F (36.7 C)-98.4 F (36.9 C)] 98.4 F (36.9 C) (01/03 0435) Pulse Rate:  [79-103] 83 (01/03 0435) Resp:  [16-18] 18 (01/03 0435) BP: (86-111)/(48-75) 111/75 (01/03 0435) SpO2:  [94 %-98 %] 94 % (01/03 0536) Weight:  [90 kg (198 lb 6.6 oz)] 90 kg (198 lb 6.6 oz) (01/03 0455) Last BM Date: 07/11/16  Intake/Output from previous day: 01/02 0701 - 01/03 0700 In: 240 [P.O.:240] Out: 2150 [Urine:2150] Intake/Output this shift: No intake/output data recorded.  General appearance: alert and cooperative GI: mildly distended,soft  Lab Results:  Results for orders placed or performed during the hospital encounter of 06/24/16 (from the past 24 hour(s))  Glucose, capillary     Status: Abnormal   Collection Time: 07/12/16 11:43 AM  Result Value Ref Range   Glucose-Capillary 170 (H) 65 - 99 mg/dL  CBC     Status: Abnormal   Collection Time: 07/12/16  1:32 PM  Result Value Ref Range   WBC 11.8 (H) 4.0 - 10.5 K/uL   RBC 3.52 (L) 4.22 - 5.81 MIL/uL   Hemoglobin 11.7 (L) 13.0 - 17.0 g/dL   HCT 34.9 (L) 39.0 - 52.0 %   MCV 99.1 78.0 - 100.0 fL   MCH 33.2 26.0 - 34.0 pg   MCHC 33.5 30.0 - 36.0 g/dL   RDW 19.5 (H) 11.5 - 15.5 %   Platelets 73 (L) 150 - 400 K/uL  Glucose, capillary     Status: Abnormal   Collection Time: 07/12/16  5:05 PM  Result Value Ref Range   Glucose-Capillary 146 (H) 65 - 99 mg/dL   Comment 1 Notify RN    Comment 2 Document in Chart   Glucose, capillary     Status: Abnormal   Collection Time: 07/12/16  8:35 PM  Result Value Ref Range   Glucose-Capillary 170 (H) 65 - 99 mg/dL  Glucose, capillary     Status: Abnormal   Collection Time: 07/12/16 11:30 PM  Result Value Ref Range   Glucose-Capillary 144 (H) 65 - 99 mg/dL  Glucose, capillary     Status: Abnormal   Collection Time: 07/13/16  3:39 AM  Result Value Ref Range   Glucose-Capillary 123 (H) 65 - 99 mg/dL  Glucose, capillary     Status: Abnormal   Collection Time: 07/13/16  7:43 AM  Result Value Ref Range   Glucose-Capillary 122 (H) 65 - 99 mg/dL   Comment 1 Notify RN    Comment 2 Document in Chart    *Note: Due to a large number of results and/or encounters for the requested time period, some results have not been displayed. A complete set of results can be found in Results Review.     Studies/Results Radiology     MEDS, Scheduled . cholecalciferol  1,000 Units Oral QHS  . desmopressin  0.1 mg Oral QHS  . hydrocortisone sod succinate (SOLU-CORTEF) inj  25 mg Intravenous Q8H  . insulin aspart  0-15 Units Subcutaneous Q4H  . mouth rinse  15 mL Mouth Rinse BID  . midodrine  15 mg Oral TID WC  . pantoprazole  40 mg Oral Q1200  . polyethylene glycol  17 g Oral Daily  . rivaroxaban  20 mg Oral Q supper  . sodium chloride flush  10-40 mL Intracatheter Q12H  . sodium chloride flush  3 mL Intravenous Q12H  Assessment: SBO (small bowel obstruction) Non-severe (moderate) malnutrition  Plan: Pt too debilitated for surgery right now.  Cont TPN.  Will have pharmacy begin to cycle TPN with plan for d/c to SNF once cycled.  Once pt stronger, will plan on surgery.       LOS: 19 days    Rosario Adie, MD [facility] Surgery, [place]   07/13/2016 9:19 AM

## 2016-07-13 NOTE — Care Management Important Message (Signed)
Important Message  Patient Details  Name: PESACH FRISCH MRN: [id number] Date of Birth: 12/30/1940   Medicare Important Message Given:  Yes    Kerin Salen 07/13/2016, 11:42 AMImportant Message  Patient Details  Name: VERNELL TOWNLEY MRN: [id number] Date of Birth: 12/10/1940   Medicare Important Message Given:  Yes    Kerin Salen 07/13/2016, 11:42 AM

## 2016-07-13 NOTE — Progress Notes (Signed)
[place] Progress Note Patient Name: BASSAM DRESCH DOB: [birth year] MRN: [id number]   Date of Service  07/13/2016  HPI/Events of Note  Called for distress, remains on high O2 Camera care- rr 30  eICU Interventions  Having provider to bedside Stat abg     Intervention Category Major Interventions: Hypoxemia - evaluation and management  Raylene Miyamoto. 07/13/2016, 9:49 PM

## 2016-07-13 NOTE — Progress Notes (Signed)
Nutrition Follow-up  DOCUMENTATION CODES:   Non-severe (moderate) malnutrition in context of chronic illness  INTERVENTION:   Monitor magnesium, potassium, and phosphorus daily for at least 3 days, MD to replete as needed, as pt is at risk for refeeding syndrome given malnutrition, poor PO intake since November and low lab values at admission.  TPN per Pharmacy Encourage sips of clears as tolerated.  RD will continue to follow.  NUTRITION DIAGNOSIS:   Malnutrition related to altered GI function, cancer and cancer related treatments as evidenced by moderate depletions of muscle mass, severe fluid accumulation, energy intake < or equal to 75% for > or equal to 1 month.  Ongoing.  GOAL:   Patient will meet greater than or equal to 90% of their needs  Will meet with TPN.  MONITOR:   PO intake, Labs, Weight trends, Skin, I & O's, Other (Comment) (TPN)  ASSESSMENT:   7 yoM with recent admission 11/26-12/4 for SBO, re-admitted on 12/15 with PE and recurrent SBO. PMH is significant for metastatic lung cancer (immunotherapy with Nat Math on hold) and chronic anticoagulation for history of PE/DVT s/p IVC filter placement.  Surgery reconsulted 12/29 because SBO not improving.  Unable to tolerate fluids, continued N/V.  NG tube placement failed multiple times.  Pt to proceed with surgery once Xarelto cleared.  Xarelto d/c'd 12/29 and surgery wants to wait 3 days for clearance.  TPN to start 12/30.75 yoM with recent admission 11/26-12/4 for SBO, re-admitted on 12/15 with PE and recurrent SBO. PMH is significant for metastatic lung cancer (immunotherapy with Nat Math on hold) and chronic anticoagulation for history of PE/DVT s/p IVC filter placement.  Surgery reconsulted 12/29 because SBO not improving.  Unable to tolerate fluids, continued N/V.  NG tube placement failed multiple times.  Pt to proceed with surgery once Xarelto cleared.  Xarelto d/c'd 12/29 and surgery wants to wait 3 days for  clearance.  TPN to start 12/30.  Per RN, pt not tolerating clear liquids. Pt has been given Zofran PRN for nausea.   Pt will begin to advance TPN and then transition to cyclic TPN prior to discharge. TPN will increase: Clinimix E5/20 @ 83 ml/hr and ILE at 10 ml/hr. Provides 2151 kcal(100% of needs) and 100g protein (87% of needs).  Medications: Vitamin D tablet daily, Protonix tablet daily, IV Zofran PRN Labs reviewed: CBGs: 122-144  Plan per Pharmacy 1/3: At 1800 today:  Will continue Clinimix E 5/20 and advance to modified goal rate of 83 ml/hr, which is ~87% of protein needs and 98% of calorie needs per RD assessment  Continue 20% fat emulsion at 10 ml/hr  Diet Order:  Diet clear liquid Room service appropriate? Yes; Fluid consistency: Thin .TPN (CLINIMIX-E) Adult .TPN (CLINIMIX-E) Adult  Skin:  Wound (see comment) (foot wound )  Last BM:  1/1  Height:   Ht Readings from Last 1 Encounters:  07/08/16 '5\' 9"'$  (1.753 m)    Weight:   Wt Readings from Last 1 Encounters:  07/13/16 198 lb 6.6 oz (90 kg)    Ideal Body Weight:  72.7 kg  BMI:  Body mass index is 29.3 kg/m.  Estimated Nutritional Needs:   Kcal:  2150-2350  Protein:  115-125g  Fluid:  2.2L/day  EDUCATION NEEDS:   Education needs addressed  Clayton Bibles, MS, RD, LDN Pager: [phone] After Hours Pager: [phone]

## 2016-07-13 NOTE — H&P (Signed)
Physical Medicine and Rehabilitation Admission H&P    Chief Complaint  Patient presents with  .  Debility    HPI:  [name] is a 76 year old male with history of MM,  stage IVB NSCLC with mets to brain 04/2016 and ongoing chemo, recent PE--on Eliquis who was readmitted to [facility] 06/24/16 with recurrent   SBO and severe abdominal pain. History taken from chart review, patient, and wife. He was admitted to [facility] on 12/15 and NGT placed for decompression per [facility]. Hospital course complicated by projectile vomiting with hypotension due to sepsis form  Bacteriodes vulgatus bacteremia, colitis with GIB--NOAC held and acute BLE DVTs. IVC filter placed and multiple attempts at initiating diet unsuccessful therefore TPN started yesterday.  He was set for surgery due to ongoing abdominal pain but felt to be high surgical risk due to significant debility. Dr. Marcello Moores recommends getting albumin closer to 3 prior to surgical intervention. Confusion resolving with clearing of mentation per notes. Therapy ongoing and limited by dizziness with orthostatic changes. BUE dopplers done 12/28 due to edema and were negative for DVT. CIR recommended for follow up therapy.    Review of Systems  Constitutional: Positive for malaise/fatigue.  HENT: Negative for hearing loss.   Eyes: Negative for blurred vision.  Respiratory: Negative for shortness of breath.   Cardiovascular: Positive for leg swelling. Negative for chest pain.  Gastrointestinal: Positive for abdominal pain, nausea and vomiting.  Skin: Negative for rash.  Neurological: Positive for weakness.  Psychiatric/Behavioral: The patient is nervous/anxious.   All other systems reviewed and are negative.     Past Medical History:  Diagnosis Date  . Adenocarcinoma of right lung, stage 4 ([place]) 05/27/2016  . Adenomatous polyp of colon 1996  . Arthritis    Osteoarthritis right knee, spine, wrist  . Arthritis of hand, right    Right thumb (Dr. Daylene Katayama)  .  Back ache    r/o Multiple Myeloma  . Benign prostatic hypertrophy    (Dr. Karsten Ro in past)  . Degenerative disc disease   . Diverticulosis   . DVT (deep venous thrombosis) ([place]) 01/2014   R calf  . Encounter for antineoplastic chemotherapy 10/20/2015  . Erosive esophagitis   . GERD (gastroesophageal reflux disease)   . Hemorrhoids   . Hiatal hernia   . History of chronic prostatitis   . History of melanoma    Back (Dr. Wilhemina Bonito); early melanoma R arm 03/2011  . Multiple myeloma ([place]) 2013   skull lytic lesions--treated with radiation 07/2014  . Pulmonary embolism ([place]) 01/2014  . Radiation 07/21/14-08/05/14   left occipital parietal skull 25 gray  . SCC (squamous cell carcinoma) 08/04/15   right ear;dysplastic nevus with severe atypia-chest    Past Surgical History:  Procedure Laterality Date  . APPENDECTOMY    . BONE MARROW TRANSPLANT  03/2013   Stem Cell  . COLONOSCOPY  2009   adenomatous polyp  . ESOPHAGOGASTRODUODENOSCOPY  2009   mild inflammation at esophagogastric junction  . excision of melanoma     back ( x3)  . Great toe surgery     bilateral  . INGUINAL HERNIA REPAIR     left, x 2  . IR GENERIC HISTORICAL  06/30/2016   IR IVC FILTER PLMT / S&I Burke Keels GUID/MOD SED 06/30/2016 Jacqulynn Cadet, MD [facility]-INTERV RAD  . port placemet  10/2012  . SCALENE NODE BIOPSY Right 05/11/2016   Procedure: BIOPSY RIGHT SCALENE NODE;  Surgeon: Grace Isaac, MD;  Location: [facility] OR;  Service: Thoracic;  Laterality: Right;  . TOTAL KNEE ARTHROPLASTY  06/08/2011   Procedure: TOTAL KNEE ARTHROPLASTY;  Surgeon: Ninetta Lights, MD;  Location: [place];  Service: Orthopedics;  Laterality: Right;  osteonics  . total knee replaced  2000   Left knee    Family History  Problem Relation Age of Onset  . Dementia Mother   . Stroke Father     brainstem stroke  . Hypertension Father   . Diabetes Father   . Prostate cancer Maternal Grandfather   . Depression Daughter   . Anesthesia problems Neg Hx      Social History:  reports that he quit smoking about 40 years ago. His smoking use included Cigarettes. He has a 30.00 pack-year smoking history. He has never used smokeless tobacco. He reports that he drinks about 1.5 oz of alcohol per week . He reports that he does not use drugs.     Allergies  Allergen Reactions  . Tramadol Palpitations    "Made my chest feel tight"  . Amoxicillin     REACTION: rash  . Penicillins Rash    Has patient had a PCN reaction causing immediate rash, facial/tongue/throat swelling, SOB or lightheadedness with hypotension: unknown Has patient had a PCN reaction causing severe rash involving mucus membranes or skin necrosis: unknown Has patient had a PCN reaction that required hospitalization: no  Has patient had a PCN reaction occurring within the last 10 years: no  If all of the above answers are "NO", then may proceed with Cephalosporin use.     Medications Prior to Admission  Medication Sig Dispense Refill  . acetaminophen (TYLENOL) 500 MG tablet Take 500 mg by mouth every 6 (six) hours as needed for moderate pain or headache.    Marland Kitchen apixaban (ELIQUIS) 5 MG TABS tablet Take 2 tabs ('10mg'$ ) po BID for 7 days, then take 1 tab ('5mg'$ ) po BID 60 tablet 0  . ascorbic acid (VITAMIN C) 500 MG tablet Take 500 mg by mouth 2 (two) times daily.     . cholecalciferol (VITAMIN D) 1000 UNITS tablet Take 1,000 Units by mouth at bedtime.     Marland Kitchen desmopressin (DDAVP) 0.1 MG tablet Take 1 tablet (0.1 mg total) by mouth at bedtime. 30 tablet 0  . dexamethasone (DECADRON) 4 MG tablet Take 1 tablet (4 mg total) by mouth 2 (two) times daily. (Patient taking differently: Take 2 mg by mouth 2 (two) times daily. )    . fish oil-omega-3 fatty acids 1000 MG capsule Take 1 g by mouth at bedtime.     . furosemide (LASIX) 20 MG tablet Take 1 tablet (20 mg total) by mouth daily. (Patient taking differently: Take 20 mg by mouth daily as needed for fluid. ) 30 tablet 0  . [EXPIRED]  levofloxacin (LEVAQUIN) 500 MG tablet Take 1 tablet (500 mg total) by mouth daily. 6 tablet 0  . LORazepam (ATIVAN) 0.5 MG tablet Take 1 tab PO Q 6 hours PRN nausea or anxiety (Patient taking differently: Take 0.5 mg by mouth every 6 (six) hours as needed for anxiety (nausea). ) 30 tablet 0  . methocarbamol (ROBAXIN) 500 MG tablet TAKE 1 TO 2 TABLETS BY MOUTH EVERY 6 HOURS AS NEEDED FOR MUSCLE SPASMS 60 tablet 0  . Multiple Vitamin (MULTIVITAMIN WITH MINERALS) TABS Take 1 tablet by mouth at bedtime.     . ondansetron (ZOFRAN) 8 MG tablet Take 1 tablet (8 mg total) by mouth every 8 (eight)  hours as needed for nausea or vomiting. 30 tablet 1  . oxyCODONE (ROXICODONE) 5 MG immediate release tablet Take 1 tablet (5 mg total) by mouth every 6 (six) hours as needed for severe pain. 15 tablet 0  . pantoprazole (PROTONIX) 40 MG tablet TAKE 1 TABLET BY MOUTH TWO  TIMES DAILY 180 tablet 2  . Probiotic Product (ALIGN) 4 MG CAPS Take 1 capsule by mouth daily. Reported on 09/10/2015    . prochlorperazine (COMPAZINE) 10 MG tablet Take 1 tablet (10 mg total) by mouth every 6 (six) hours as needed. (Patient taking differently: Take 10 mg by mouth every 6 (six) hours as needed for nausea or vomiting. ) 30 tablet 1  . tamsulosin (FLOMAX) 0.4 MG CAPS capsule Take 2 capsules (0.8 mg total) by mouth daily after supper. (Patient taking differently: Take 0.8 mg by mouth 2 (two) times daily. ) 60 capsule 11  . temazepam (RESTORIL) 15 MG capsule Take 1 capsule (15 mg total) by mouth at bedtime as needed. (Patient taking differently: Take 15 mg by mouth at bedtime as needed for sleep. ) 60 capsule 0  . zolendronic acid (ZOMETA) 4 MG/5ML injection Inject 4 mg into the vein every 3 (three) months.     . polyethylene glycol (MIRALAX) packet Take 17 g by mouth daily as needed for mild constipation. (Patient not taking: Reported on 06/24/2016) 28 each 1    Home: Home Living Family/patient expects to be discharged to:: Private  residence Living Arrangements: Spouse/significant other Available Help at Discharge: Family Type of Home: House Home Access: Stairs to enter Secretary/administrator of Steps: 3 Entrance Stairs-Rails: None Home Layout: One level Bathroom Shower/Tub: Health visitor: Standard Home Equipment: Bedside commode, Environmental consultant - 2 wheels, Shower seat - built in   Functional History: Prior Function Level of Independence: Independent Comments: Pt walked an hour a day until recently.  Noted decline over past several weeks  Functional Status:  Mobility: Bed Mobility Overal bed mobility: Needs Assistance Bed Mobility: Supine to Sit Rolling: Supervision Supine to sit: Supervision Sit to supine: Mod assist General bed mobility comments: assist for bil LEs Transfers Overall transfer level: Needs assistance Equipment used: Rolling walker (2 wheeled) Transfers: Sit to/from Stand Sit to Stand: Min assist, +2 safety/equipment Stand pivot transfers: Min assist, +2 safety/equipment General transfer comment: assist to rise and stabilize.   Ambulation/Gait Ambulation/Gait assistance: Min assist, +2 safety/equipment Ambulation Distance (Feet): 6 Feet Assistive device: Rolling walker (2 wheeled) Gait Pattern/deviations: Step-through pattern, Decreased step length - right, Decreased step length - left, Shuffle, Trunk flexed General Gait Details: cues for posture and position from RW.  Distance ltd by BP changes and deconditioning/weakness Gait velocity: decr Gait velocity interpretation: Below normal speed for age/gender    ADL: ADL Overall ADL's : Needs assistance/impaired Eating/Feeding: Minimal assistance, Sitting Grooming: Set up, Wash/dry hands, Sitting Upper Body Bathing: Minimal assistance, Sitting Lower Body Bathing: Moderate assistance, Sit to/from stand Upper Body Dressing : Minimal assistance, Sitting, Cueing for sequencing Lower Body Dressing: Maximal assistance, Sit  to/from stand, Cueing for safety, Cueing for sequencing, Cueing for compensatory techniques Toilet Transfer: Minimal assistance, +2 for physical assistance, Stand-pivot, RW (back to bed) General ADL Comments: pt performed ADL from chair.  Decreased BP in sitting 95/74; 69/46; 86/47; 86/51.  when back to bed 100/64.  Did not get a standing BP.  Performed level one theraband exercises when back in bed.  Pt's began to perform theraband exercises but could not complete last one due  to stomach cramping  Cognition: Cognition Overall Cognitive Status: Within Functional Limits for tasks assessed Orientation Level: Oriented X4 Cognition Arousal/Alertness: Awake/alert Behavior During Therapy: WFL for tasks assessed/performed Overall Cognitive Status: Within Functional Limits for tasks assessed Area of Impairment: Orientation, Memory, Safety/judgement, Problem solving Orientation Level: Time Safety/Judgement: Decreased awareness of safety Problem Solving: Slow processing, Requires verbal cues General Comments: reinforcement for UE exercise program  Physical Exam: Blood pressure 108/69, pulse (!) 106, temperature 97.7 F (36.5 C), temperature source Oral, resp. rate 16, height '5\' 9"'$  (1.753 m), weight 90 kg (198 lb 6.6 oz), SpO2 96 %. Physical Exam  Nursing note and vitals reviewed. Constitutional: He is oriented to person, place, and time. He appears well-developed and well-nourished. He has a sickly appearance. He appears distressed.  Pale appearing male. Heaves with vomiting during exam. Patient/wife report abdominal distension/pain is worse in past 24 hours.   HENT:  Head: Normocephalic and atraumatic.  Eyes: Conjunctivae and EOM are normal.  Neck: Normal range of motion. Neck supple.  Cardiovascular: Regular rhythm.  Tachycardia present.   Respiratory: Effort normal and breath sounds normal.  GI: He exhibits distension. Bowel sounds are increased. There is tenderness.  Musculoskeletal: He  exhibits edema and tenderness.  Paper thin skin with 2-3+ pitting edema BLE--multiple petechial lesions bilateral shins.   Bilateral forearms with dependent edema.   Neurological: He is alert and oriented to person, place, and time.  Motor: 4-/5 throughout Sensation intact to light touch  Skin: Skin is warm and dry.  Psychiatric: He has a normal mood and affect. His behavior is normal.    Results for orders placed or performed during the hospital encounter of 06/24/16 (from the past 48 hour(s))  Glucose, capillary     Status: Abnormal   Collection Time: 07/11/16 11:56 AM  Result Value Ref Range   Glucose-Capillary 122 (H) 65 - 99 mg/dL  Glucose, capillary     Status: Abnormal   Collection Time: 07/11/16  4:13 PM  Result Value Ref Range   Glucose-Capillary 152 (H) 65 - 99 mg/dL  Glucose, capillary     Status: Abnormal   Collection Time: 07/11/16  8:10 PM  Result Value Ref Range   Glucose-Capillary 216 (H) 65 - 99 mg/dL   Comment 1 Notify RN   Glucose, capillary     Status: Abnormal   Collection Time: 07/12/16 12:10 AM  Result Value Ref Range   Glucose-Capillary 179 (H) 65 - 99 mg/dL   Comment 1 Notify RN   Comprehensive metabolic panel     Status: Abnormal   Collection Time: 07/12/16  4:13 AM  Result Value Ref Range   Sodium 136 135 - 145 mmol/L   Potassium 3.8 3.5 - 5.1 mmol/L   Chloride 105 101 - 111 mmol/L   CO2 18 (L) 22 - 32 mmol/L   Glucose, Bld 229 (H) 65 - 99 mg/dL   BUN 18 6 - 20 mg/dL   Creatinine, Ser 0.77 0.61 - 1.24 mg/dL   Calcium 7.7 (L) 8.9 - 10.3 mg/dL   Total Protein 5.1 (L) 6.5 - 8.1 g/dL   Albumin 2.2 (L) 3.5 - 5.0 g/dL   AST 45 (H) 15 - 41 U/L   ALT 27 17 - 63 U/L   Alkaline Phosphatase 97 38 - 126 U/L   Total Bilirubin 0.7 0.3 - 1.2 mg/dL   GFR calc non Af Amer >60 >60 mL/min   GFR calc Af Amer >60 >60 mL/min    Comment: (NOTE) The  eGFR has been calculated using the CKD EPI equation. This calculation has not been validated in all clinical  situations. eGFR's persistently <60 mL/min signify possible Chronic Kidney Disease.    Anion gap 13 5 - 15  Magnesium     Status: None   Collection Time: 07/12/16  4:13 AM  Result Value Ref Range   Magnesium 2.0 1.7 - 2.4 mg/dL  Phosphorus     Status: None   Collection Time: 07/12/16  4:13 AM  Result Value Ref Range   Phosphorus 2.6 2.5 - 4.6 mg/dL  Glucose, capillary     Status: Abnormal   Collection Time: 07/12/16  4:30 AM  Result Value Ref Range   Glucose-Capillary 220 (H) 65 - 99 mg/dL   Comment 1 Notify RN   Glucose, capillary     Status: Abnormal   Collection Time: 07/12/16  7:38 AM  Result Value Ref Range   Glucose-Capillary 180 (H) 65 - 99 mg/dL  Glucose, capillary     Status: Abnormal   Collection Time: 07/12/16 11:43 AM  Result Value Ref Range   Glucose-Capillary 170 (H) 65 - 99 mg/dL  CBC     Status: Abnormal   Collection Time: 07/12/16  1:32 PM  Result Value Ref Range   WBC 11.8 (H) 4.0 - 10.5 K/uL   RBC 3.52 (L) 4.22 - 5.81 MIL/uL   Hemoglobin 11.7 (L) 13.0 - 17.0 g/dL    Comment: REPEATED TO VERIFY DELTA CHECK NOTED    HCT 34.9 (L) 39.0 - 52.0 %   MCV 99.1 78.0 - 100.0 fL   MCH 33.2 26.0 - 34.0 pg   MCHC 33.5 30.0 - 36.0 g/dL   RDW 19.5 (H) 11.5 - 15.5 %   Platelets 73 (L) 150 - 400 K/uL    Comment: SPECIMEN CHECKED FOR CLOTS REPEATED TO VERIFY PLATELET COUNT CONFIRMED BY SMEAR   Glucose, capillary     Status: Abnormal   Collection Time: 07/12/16  5:05 PM  Result Value Ref Range   Glucose-Capillary 146 (H) 65 - 99 mg/dL   Comment 1 Notify RN    Comment 2 Document in Chart   Glucose, capillary     Status: Abnormal   Collection Time: 07/12/16  8:35 PM  Result Value Ref Range   Glucose-Capillary 170 (H) 65 - 99 mg/dL  Glucose, capillary     Status: Abnormal   Collection Time: 07/12/16 11:30 PM  Result Value Ref Range   Glucose-Capillary 144 (H) 65 - 99 mg/dL  Glucose, capillary     Status: Abnormal   Collection Time: 07/13/16  3:39 AM  Result  Value Ref Range   Glucose-Capillary 123 (H) 65 - 99 mg/dL  Glucose, capillary     Status: Abnormal   Collection Time: 07/13/16  7:43 AM  Result Value Ref Range   Glucose-Capillary 122 (H) 65 - 99 mg/dL   Comment 1 Notify RN    Comment 2 Document in Chart   Basic metabolic panel     Status: Abnormal (Preliminary result)   Collection Time: 07/13/16  9:56 AM  Result Value Ref Range   Sodium PENDING 135 - 145 mmol/L   Potassium 3.4 (L) 3.5 - 5.1 mmol/L   Chloride 112 (H) 101 - 111 mmol/L   CO2 27 22 - 32 mmol/L   Glucose, Bld 152 (H) 65 - 99 mg/dL   BUN 23 (H) 6 - 20 mg/dL   Creatinine, Ser 0.81 0.61 - 1.24 mg/dL   Calcium 8.2 (L) 8.9 -  10.3 mg/dL   GFR calc non Af Amer >60 >60 mL/min   GFR calc Af Amer >60 >60 mL/min    Comment: (NOTE) The eGFR has been calculated using the CKD EPI equation. This calculation has not been validated in all clinical situations. eGFR's persistently <60 mL/min signify possible Chronic Kidney Disease.    Anion gap PENDING 5 - 15  Phosphorus     Status: None   Collection Time: 07/13/16  9:56 AM  Result Value Ref Range   Phosphorus 3.1 2.5 - 4.6 mg/dL   *Note: Due to a large number of results and/or encounters for the requested time period, some results have not been displayed. A complete set of results can be found in Results Review.   No results found.   Medical Problem List and Plan: 1.  Weakness, poor activity tolerance secondary to SBO with multiple medial issues. 2.  Recent PE/New DVTs/Anticoagulation: IVC filter in place. Xarelto resumed 12/27. Monitor for any signs of recurrent lower GIB.   3. Pain Management: Narcotics discontinued 12/30 due to hypotension/confusion?, restarted, cont IV meds 4. Mood: LCSW to follow for evaluation and support.  5. Neuropsych: This patient is capable of making decisions on his own behalf. 6. Skin/Wound Care: routine pressure relief measures.  7. Fluids/Electrolytes/Nutrition: Maintain NPO status for now.  8.  Recurrent SBO: On TNA to help boost nutritional status.  Abdomen worse per patient/wife--he is willing to have NGT placed for decompression. Will change diet to NPO due to recurrent vomiting with po trials. KUB ordered. 9. Hypotension: Will continue to monitor orthostatic vitals. On  Midodrine for BP support.  10. Diabetes insipidus: On DDAVP--has been refusing meds X 2 days. To be tapered to decadron today but may need to keep IV Solucortef on board due to inability to tolerate po . 11. Acute on chronic anemia: Continue to monitor--H/H higher today due to worsening of renal status 12. Hypokalemia: Will recheck in am--hopefully to be managed with TPN.  13. Hyperglycemia: Due to steroids and TNA--A1c- 5.2.  Will monitor BS every 4 hours and use SSI for elevated BS.  14. Hypoalbuminemia: Will cont to monitor   Post Admission Physician Evaluation: 1. Preadmission assessment reviewed and changes made below. 2. Functional deficits secondary to SBO with multiple medical issues. 3. Patient is admitted to receive collaborative, interdisciplinary care between the physiatrist, rehab nursing staff, and therapy team. 4. Patient's level of medical complexity and substantial therapy needs in context of that medical necessity cannot be provided at a lesser intensity of care such as a SNF. 5. Patient has experienced substantial functional loss from his/her baseline which was documented above under the "Functional History" and "Functional Status" headings.  Judging by the patient's diagnosis, physical exam, and functional history, the patient has potential for functional progress which will result in measurable gains while on inpatient rehab.  These gains will be of substantial and practical use upon discharge  in facilitating mobility and self-care at the household level. 6. Physiatrist will provide 24 hour management of medical needs as well as oversight of the therapy plan/treatment and provide guidance as  appropriate regarding the interaction of the two. 7. The Preadmission Screening has been reviewed and patient status is unchanged unless otherwise stated above. 8. 24 hour rehab nursing will assist with bowel management, safety, skin/wound care, disease management, pain management and patient education  and help integrate therapy concepts, techniques,education, etc. 9. PT will assess and treat for/with: Lower extremity strength, range of motion, stamina, balance,  functional mobility, safety, adaptive techniques and equipment, woundcare, coping skills, pain control, education. Goals are: Supervision/Min A. 10. OT will assess and treat for/with: ADL's, functional mobility, safety, upper extremity strength, adaptive techniques and equipment, wound mgt, ego support, and community reintegration. Goals are: Supervision/Min A. Therapy may proceed with showering this patient. 11. Case Management and Social Worker will assess and treat for psychological issues and discharge planning. 12. Team conference will be held weekly to assess progress toward goals and to determine barriers to discharge. 13. Patient will receive at least 3 hours of therapy per day at least 5 days per week. 14. ELOS: 18-22 days. 15. Prognosis:  good and fair  Delice Lesch, MD, [address], [place] 07/13/2016

## 2016-07-13 NOTE — Progress Notes (Signed)
CIR rep still following along. CSW is working up for SNF as back up plan for DC. CM will continue to follow. Marney Doctor RN,BSN,NCM [phone]

## 2016-07-13 NOTE — Progress Notes (Signed)
Addendum to note: IV RN changing TNA, called to room, pt noted difficultly breathing, rhonchi noted, audible congestion, pt  Pale in color.  O2 started 2L via n/c SAT low 80's. Called respiratory, re-breather placed, pt continued to have SATs below 90, tachycardic, called rapid response while respiratory provided chest therapy. Called on-call, informed of pt status, new orders received and continued plan of care.

## 2016-07-13 NOTE — Progress Notes (Addendum)
PHARMACY - ADULT TOTAL PARENTERAL NUTRITION CONSULT NOTE   Pharmacy Consult for TPN Indication: bowel obstruction  Patient Measurements: Height: '5\' 9"'$  (175.3 cm) Weight: 198 lb 6.6 oz (90 kg) IBW/kg (Calculated) : 70.7 TPN AdjBW (KG): 79.3 Body mass index is 29.3 kg/m.  Insulin Requirements: 15 units of moderate scale SSI last 24 hr  Current Nutrition: CLD starting 1/2; not tolerating per RN huddle  IVF: NS at 50 ml/hr  Central access: implanted port TPN start date: 12/30  ASSESSMENT                                                                                                          HPI: 70 yoM with recent admission 11/26-12/4 for SBO, re-admitted on 12/15 with PE and recurrent SBO. PMH is significant for metastatic lung cancer (immunotherapy with Nat Math on hold) and chronic anticoagulation for history of PE/DVT s/p IVC filter placement.  Surgery reconsulted 12/29 because SBO not improving.  Unable to tolerate fluids, continued N/V.  NG tube placement failed multiple times.  Pt to proceed with surgery once Xarelto cleared.  Xarelto d/c'd 12/29 and surgery wants to wait 3 days for clearance.  TPN to start 12/30.  Significant events:  1/1: possible laparoscopy tomorrow 1/2 at earliest but [facility] wants to get nutritional status prior to surgery 1/2: per Surgery, too debilitated for procedure. Plan is to d/c to SNF by end of week with or without TPN depending on if able to advance to/tolerate FLD - vomited up chicken broth this AM   Today, 07/13/2016:   Glucose - no h/o DM, CBGs improved in last 24 hr (Range 122-170 since new TPN hung last PM) On hydrocortisone 25 q8 - dose decreased 1/2 PM  Electrolytes - WNL  Renal - stable wnl  LFTs - stable, albumin low   TGs - slightly elevated  Prealbumin - low/decreased  NUTRITIONAL GOALS                                                                                             RD recs (12/28): 115-125g protein, 2200-2400 kcal, 2.2L  fluid/day  (+++There is currently a Psychologist, prison and probation services of Clinimix solution.  Pharmacy will use Clinimix product based on what we have available in stock+++)  Clinimix E 5/20 at a goal rate of 100 ml/hr daily + 20% fat emulsion at 10 ml/hr on MWF only to provide: 120g/day protein, 2318Kcal/day BUT given national shortage situation, TPN goal rate has to be maxed out at 83 ml/hr which is 2 L max per day per patient which will provide 100g protein and 2151 kcal/day  PLAN  If cyclic TPN will be needed for discharge, please allow at least 72 hrs to transition patient to 12-hr cycle and ensure tolerability  At 1800 today:  Will continue Clinimix E 3/81 and start cyclic rates beginning tonight over 18 hours (50 ml/hr x 1, 115 ml/hr x 16 hr then 50 ml/hr x 1 hr) to provide 2L total, which will provide ~87% of protein needs and 98% of calorie needs per RD assessment  Continue 20% fat emulsion but will run over 18 hours (14 ml/hr)  Will hold off on adding insulin to TPN since steroids are being tapered but will monitor closely  TPN to contain standard multivitamins and trace elements.  Continue CBG/SSI checks q4h, but will change to match cyclic TPN rates better  TPN lab panels on Mondays & Thursdays.   Adrian Saran, PharmD, BCPS Pager [phone] 07/13/2016 9:23 AM

## 2016-07-13 NOTE — Progress Notes (Signed)
PHARMACY - ADULT TOTAL PARENTERAL NUTRITION CONSULT NOTE   Pharmacy Consult for TPN Indication: bowel obstruction  Assessment: 12 yoM with recent admission 11/26-12/4 for SBO, re-admitted on 12/15 with PE and recurrent SBO. PMH is significant for metastatic lung cancer (immunotherapy with Nat Math on hold) and chronic anticoagulation for history of PE/DVT s/p IVC filter placement.  Surgery reconsulted 12/29 because SBO not improving.  Unable to tolerate fluids, continued N/V.  NG tube placement failed multiple times.  Pt to proceed with surgery once Xarelto cleared.  Xarelto d/c'd 12/29 and surgery wants to wait 3 days for clearance.  TPN to start 12/30. Now being transferred to [facility] for Rehab.  Plan:  TPN will be delivered to [facility] from [place] Clinimix E 5/20 cycle. Infuse 1940 mL over 18 hrs : 50 mL/hr x 1 hr, then 115 mL/hr x 16 hrs, then 50 mL/hr x 1 hr. Continue 20% lipid emulsion at 34m/hr over 18 hrs Add MVI and TE in TPN  Left instructions for IV team  Will order TPN labs   NElenor Quinones PharmD, BCPS Clinical Pharmacist Pager [phone]/09/2016 4:27 PM

## 2016-07-13 NOTE — Progress Notes (Signed)
CSW assisting with d/c planning. CIR decision pending. SNFs aren't able to accept until TPN is cycled. Pharmacy is working on this.  CSW will remain available to assist with d/c planning as needed.  Werner Lean LCSW [phone]

## 2016-07-13 NOTE — PMR Pre-admission (Signed)
[facility] PMR Admission Coordinator Pre-Admission Assessment  Patient: [name] is an 76 y.o., male MRN: [id number] DOB: 1940-11-20 Height: '5\' 9"'$  (175.3 cm) Weight: 90 kg (198 lb 6.6 oz)  Insurance Information HMO:      PPO:  Yes     PCP:       IPA:       80/20:       OTHER:   PRIMARY:  [employer] medicare      Policy#: [id number]      Subscriber: [name] CM Name: Sharlett Iles      Phone#: [phone]     Fax#: [phone] (has EPIC access) Pre-Cert#: [id number]      Employer: Semi-retired, mortgages Benefits:  Phone #:  [phone]     Name:  [name]. Date: 07/11/16     Deduct:  $0      Out of Pocket Max: $4500 (met $0)      Life Max: none CIR: $345 days 1-5      SNF: $0 days 1-20; $160 days 21-49; $0 days 50-100 Outpatient: medical necessity     Co-Pay: $40/visit Home Health: 100%      Co-Pay: none DME: 80%     Co-Pay: 20% Providers: in Therapist, art Information    Name Relation Home Work A[place] Spouse [phone]  [phone]      Current Medical History  Patient Admitting Diagnosis:  Debility, SBO  History of Present Illness:  A 76 year old male with history of MM,  stage IVB NSCLC with mets to brain 04/2016 and ongoing chemo, recent PE--on Eliquis who was readmitted to [facility] 06/24/16 with recurrent   SBO and severe abdominal pain. He was admitted to [facility] on 12/15 and NGT placed for decompression per [facility]. Hospital course complicated by projectile vomiting with hypotension due to sepsis form  Bacteriodes vulgatus bacteremia, colitis with GIB--NOAC held and acute BLE DVTs. IVC filter placed and multiple attempts at initiating diet unsuccessful therefore TPN started yesterday.  He was set for surgery due to ongoing abdominal pain but felt to be high surgical risk due to significant debility. Dr. Marcello Moores recommends getting albumin closer to 3 prior to surgical intervention. Confusion resolving with clearing of mentation  per notes. Therapy ongoing and limited by dizziness with orthostatic changes. CIR recommended for follow up therapy.    Patient's medical record from [facility] has been reviewed by the rehabilitation admission coordinator and physician.  Past Medical History  Past Medical History:  Diagnosis Date  . Adenocarcinoma of right lung, stage 4 ([place]) 05/27/2016  . Adenomatous polyp of colon 1996  . Arthritis    Osteoarthritis right knee, spine, wrist  . Arthritis of hand, right    Right thumb (Dr. Daylene Katayama)  . Back ache    r/o Multiple Myeloma  . Benign prostatic hypertrophy    (Dr. Karsten Ro in past)  . Degenerative disc disease   . Diverticulosis   . DVT (deep venous thrombosis) ([place]) 01/2014   R calf  . Encounter for antineoplastic chemotherapy 10/20/2015  . Erosive esophagitis   . GERD (gastroesophageal reflux disease)   . Hemorrhoids   . Hiatal hernia   . History of chronic prostatitis   . History of melanoma    Back (Dr. Wilhemina Bonito); early melanoma R arm 03/2011  . Multiple myeloma ([place]) 2013   skull lytic lesions--treated with radiation 07/2014  . Pulmonary embolism ([place]) 01/2014  . Radiation 07/21/14-08/05/14  left occipital parietal skull 25 gray  . SCC (squamous cell carcinoma) 08/04/15   right ear;dysplastic nevus with severe atypia-chest    Family History   family history includes Dementia in his mother; Depression in his daughter; Diabetes in his father; Hypertension in his father; Prostate cancer in his maternal grandfather; Stroke in his father.  Prior Rehab/Hospitalizations Has the patient had major surgery during 100 days prior to admission? Yes.  Patient reports that he had a biopsy and that he had an ICD filter placed.   Current Medications See MAR from [facility]  Patients Current Diet:   Clear, thin liquids  Precautions / Restrictions Precautions Precautions: Fall Precaution Comments: monitor BP, hypotensive Restrictions Weight Bearing  Restrictions: No   Has the patient had 2 or more falls or a fall with injury in the past year?No  Prior Activity Level Community (5-7x/wk): Went out daily.  Was very active and walked daily.  Prior Functional Level Self Care: Did the patient need help bathing, dressing, using the toilet or eating?  Independent  Indoor Mobility: Did the patient need assistance with walking from room to room (with or without device)? Independent  Stairs: Did the patient need assistance with internal or external stairs (with or without device)? Independent  Functional Cognition: Did the patient need help planning regular tasks such as shopping or remembering to take medications? [place] / Equipment Home Assistive Devices/Equipment: Environmental consultant (specify type) Home Equipment: Bedside commode, Walker - 2 wheels, Shower seat - built in  Prior Device Use: Indicate devices/aids used by the patient prior to current illness, exacerbation or injury? Walker.  Has been using a walker since 11/17.   Prior Functional Level Current Functional Level  Bed Mobility  Independent  Other (Supervision)   Transfers  Independent  Min assist   Mobility - Walk/Wheelchair  Independent  Min assist (Ambulated 6 feet RW)   Upper Body Dressing  Independent  Min assist   Lower Body Dressing  Independent  Max assist   Grooming  Independent  Other (Set up)   Eating/Drinking  Independent  Other (Set up)   Toilet Transfer  Independent  Mod assist   Bladder Continence   WDL  Condom catheter in place   Bowel Management  WDL  Last BM 07/12/16   Stair Climbing  Independent Other (Not tried.)   Communication  Intact and verbal  Verbal and intact   Memory  Intact  Intact   Cooking/Meal Prep  Independent      Housework  Independent    Money Management  Independent    Driving  Yes, independent     Special needs/care consideration BiPAP/CPAP No CPM No Continuous Drip  IV TPN/TNA, plans are for cyclic TPN/TNA Dialysis No        Life Vest No Oxygen No Special Bed No Trach Size No Wound Vac (area) No     Skin No                             Bowel mgmt: Last BM 07/12/16 Bladder mgmt: Condom catheter Diabetic mgmt Yes, on oral medications at home.  Is on insulin in hospital.  Previous Home Environment Living Arrangements: Spouse/significant other Available Help at Discharge: Family Type of Home: House Home Layout: One level Home Access: Stairs to enter Entrance Stairs-Rails: None Technical brewer of Steps: 3 Bathroom Shower/Tub: Multimedia programmer: [place]: No  Discharge Living  Setting Plans for Discharge Living Setting: Patient's home, House, Lives with (comment) (Lives with wife.) Type of Home at Discharge: House Discharge Home Layout: Two level, Able to live on main level with bedroom/bathroom Alternate Level Stairs-Number of Steps: Flight Discharge Home Access: Stairs to enter Entrance Stairs-Number of Steps: 6 Does the patient have any problems obtaining your medications?: No  Social/Family/Support Systems Patient Roles: Spouse, Parent (Has a wife, 2 dtrs and 4 grand dtrs.) Contact Information: Joachim Carton - wife Anticipated Caregiver: wife Anticipated Caregiver's Contact Information: Alvino Chapel - wife - (h) [phone] (c) [phone] Ability/Limitations of Caregiver: Wife is retired and can assist. Medical laboratory scientific officer: 24/7 Discharge Plan Discussed with Primary Caregiver: Yes Is Caregiver In Agreement with Plan?: Yes Does Caregiver/Family have Issues with Lodging/Transportation while Pt is in Rehab?: No  Goals/Additional Needs Patient/Family Goal for Rehab: PT/OT mod I and supervision goals Expected length of stay: 7-10 days Cultural Considerations: None Dietary Needs: Clear thin liquids Equipment Needs: TBD Special Service Needs: Patient is being followed by general surgery.  Patient needs  abdominal surgery once he is stronger and once albumin is improved.  Likely will not discharge home.  Will discharge back to acute for surgery. Pt/Family Agrees to Admission and willing to participate: Yes Program Orientation Provided & Reviewed with Pt/Caregiver Including Roles  & Responsibilities: Yes  Patient Condition: I met with patient at his bedside and discussed rehab venues.  Patient was very interested and agreed to inpatient rehab admission.  He needs medical management due to ongoing issues keeping patient in the acute care setting.  He can tolerate and will benefit from 3 hours of therapy a day.  He needs the coordinated team approach of inpatient rehab setting to regain his strength, return lab levels to normal, and to prepare patient for his next step of abdominal surgery.  I have discussed this case with rehab MD and I have approval for acute inpatient rehab admission for today.   Preadmission Screen Completed By:  Trish Mage, 07/13/2016 12:58 PM ______________________________________________________________________   Discussed status with Dr. Allena Katz on  07/14/15 at 1309 and received telephone approval for admission today.  Admission Coordinator:  Trish Mage, time 1309/Date 07/13/16   Assessment/Plan: Diagnosis: Debility 1. Does the need for close, 24 hr/day  Medical supervision in concert with the patient's rehab needs make it unreasonable for this patient to be served in a less intensive setting? Yes 2. Co-Morbidities requiring supervision/potential complications: MM,  stage IVB NSCLC with mets to brain with ongoing chemo, recent PE, recurrent SBO 3. Due to bowel management, safety, disease management, pain management and patient education, does the patient require 24 hr/day rehab nursing? Yes 4. Does the patient require coordinated care of a physician, rehab nurse, PT (1-2 hrs/day, 5 days/week) and OT (1-2 hrs/day, 5 days/week) to address physical and functional deficits in  the context of the above medical diagnosis(es)? Yes Addressing deficits in the following areas: balance, endurance, locomotion, strength, transferring, bathing, dressing, toileting and psychosocial support 5. Can the patient actively participate in an intensive therapy program of at least 3 hrs of therapy 5 days a week? Yes 6. The potential for patient to make measurable gains while on inpatient rehab is excellent 7. Anticipated functional outcomes upon discharge from inpatients are: modified independent and supervision PT, supervision and min assist OT, n/a SLP 8. Estimated rehab length of stay to reach the above functional goals is: 10-14 days. 9. Does the patient have adequate social supports to accommodate these discharge functional  goals? Yes 10. Anticipated D/C setting: Home 11. Anticipated post D/C treatments: HH therapy and Home excercise program 12. Overall Rehab/Functional Prognosis: good and fair    RECOMMENDATIONS: This patient's condition is appropriate for continued rehabilitative care in the following setting: CIR Patient has agreed to participate in recommended program. Yes Note that insurance prior authorization may be required for reimbursement for recommended care.  Retta Diones 07/13/2016  Delice Lesch, MD, Mellody Drown

## 2016-07-13 NOTE — H&P (Signed)
PULMONARY / CRITICAL CARE MEDICINE   Name: [name] MRN: [id number] DOB: [birth year]    ADMISSION DATE:  07/13/2016 CONSULTATION DATE:  1/3  REFERRING MD:  Inpatient Rehab  CHIEF COMPLAINT:  Respiratory disrtress  HISTORY OF PRESENT ILLNESS:   76 year old male with PMH as below, which is significant for MM and stage IV NSCLC now with mets to brain discovered 04/2016. Most recently he was treated with Revlimid, which is on hold, and he has been undergoing radiotherapy to brain lesions. He was also started on Ketruda, which is ongoing.  He is also being treated for pulmonary embolism with Xarelto. He has been admitted several times in the past few months, initially for SBO back in October for SBO, which resolved with conservative therapy. He was deemed a poor surgical candidate, but it was felt that if he could regain some strength he could undergo surgery. He was admitted again 12/15 with projectile vomiting and SOB. He was then found to have GNR bacteremia. Course became complicated by shock and DVT while he was off DOAC for colitis related GIB. IVC placed. He was started on TPN 1/2 and transferred to CIR 1/3. Later that day he developed vomiting again where and was suspected to have aspirated developing hypoxia and significant respiratory distress.   PAST MEDICAL HISTORY :  He  has a past medical history of Adenocarcinoma of right lung, stage 4 ([place]) (05/27/2016); Adenomatous polyp of colon (1996); Arthritis; Arthritis of hand, right; Back ache; Benign prostatic hypertrophy; Degenerative disc disease; Diverticulosis; DVT (deep venous thrombosis) ([place]) (01/2014); Encounter for antineoplastic chemotherapy (10/20/2015); Erosive esophagitis; GERD (gastroesophageal reflux disease); Hemorrhoids; Hiatal hernia; History of chronic prostatitis; History of melanoma; Multiple myeloma ([place]) (2013); Pulmonary embolism ([place]) (01/2014); Radiation (07/21/14-08/05/14); and SCC (squamous cell carcinoma)  (08/04/15).  PAST SURGICAL HISTORY: He  has a past surgical history that includes total knee replaced (2000); Appendectomy; Colonoscopy (2009); Esophagogastroduodenoscopy (2009); excision of melanoma; Great toe surgery; Inguinal hernia repair; Total knee arthroplasty (06/08/2011); port placemet (10/2012); Bone marrow transplant (03/2013); Scalene node biopsy (Right, 05/11/2016); and ir generic historical (06/30/2016).  Allergies  Allergen Reactions  . Tramadol Palpitations    "Made my chest feel tight"  . Amoxicillin     REACTION: rash  . Penicillins Rash    Has patient had a PCN reaction causing immediate rash, facial/tongue/throat swelling, SOB or lightheadedness with hypotension: unknown Has patient had a PCN reaction causing severe rash involving mucus membranes or skin necrosis: unknown Has patient had a PCN reaction that required hospitalization: no  Has patient had a PCN reaction occurring within the last 10 years: no  If all of the above answers are "NO", then may proceed with Cephalosporin use.     No current facility-administered medications on file prior to encounter.    Current Outpatient Prescriptions on File Prior to Encounter  Medication Sig  . acetaminophen (TYLENOL) 325 MG tablet Take 2 tablets (650 mg total) by mouth every 6 (six) hours as needed for mild pain (or Fever >/= 101).  Marland Kitchen acetaminophen (TYLENOL) 500 MG tablet Take 500 mg by mouth every 6 (six) hours as needed for moderate pain or headache.  Marland Kitchen ascorbic acid (VITAMIN C) 500 MG tablet Take 500 mg by mouth 2 (two) times daily.   . cholecalciferol (VITAMIN D) 1000 UNITS tablet Take 1,000 Units by mouth at bedtime.   Marland Kitchen desmopressin (DDAVP) 0.1 MG tablet Take 1 tablet (0.1 mg total) by mouth at bedtime.  Marland Kitchen dexamethasone (DECADRON) 4  MG tablet Take 1 tablet (4 mg total) by mouth 2 (two) times daily. (Patient taking differently: Take 2 mg by mouth 2 (two) times daily. )  . fish oil-omega-3 fatty acids 1000 MG capsule  Take 1 g by mouth at bedtime.   . furosemide (LASIX) 20 MG tablet Take 1 tablet (20 mg total) by mouth daily. (Patient taking differently: Take 20 mg by mouth daily as needed for fluid. )  . insulin aspart (NOVOLOG) 100 UNIT/ML injection Inject 0-15 Units into the skin every 4 (four) hours.  Marland Kitchen LORazepam (ATIVAN) 0.5 MG tablet Take 1 tab PO Q 6 hours PRN nausea or anxiety (Patient taking differently: Take 0.5 mg by mouth every 6 (six) hours as needed for anxiety (nausea). )  . methocarbamol (ROBAXIN) 500 MG tablet TAKE 1 TO 2 TABLETS BY MOUTH EVERY 6 HOURS AS NEEDED FOR MUSCLE SPASMS  . Multiple Vitamin (MULTIVITAMIN WITH MINERALS) TABS Take 1 tablet by mouth at bedtime.   . ondansetron (ZOFRAN) 8 MG tablet Take 1 tablet (8 mg total) by mouth every 8 (eight) hours as needed for nausea or vomiting.  . pantoprazole (PROTONIX) 40 MG tablet TAKE 1 TABLET BY MOUTH TWO  TIMES DAILY  . Probiotic Product (ALIGN) 4 MG CAPS Take 1 capsule by mouth daily. Reported on 09/10/2015  . prochlorperazine (COMPAZINE) 10 MG tablet Take 1 tablet (10 mg total) by mouth every 6 (six) hours as needed. (Patient taking differently: Take 10 mg by mouth every 6 (six) hours as needed for nausea or vomiting. )  . prochlorperazine (COMPAZINE) 5 MG/ML injection Inject 1 mL (5 mg total) into the vein every 6 (six) hours as needed.  . rivaroxaban (XARELTO) 20 MG TABS tablet Take 1 tablet (20 mg total) by mouth daily with supper.  . tamsulosin (FLOMAX) 0.4 MG CAPS capsule Take 2 capsules (0.8 mg total) by mouth daily after supper. (Patient taking differently: Take 0.8 mg by mouth 2 (two) times daily. )  . temazepam (RESTORIL) 15 MG capsule Take 1 capsule (15 mg total) by mouth at bedtime as needed. (Patient taking differently: Take 15 mg by mouth at bedtime as needed for sleep. )  . zolendronic acid (ZOMETA) 4 MG/5ML injection Inject 4 mg into the vein every 3 (three) months.     FAMILY HISTORY:  His indicated that his mother is  deceased. He indicated that his father is deceased. He indicated that his sister is alive. He indicated that his maternal grandfather is deceased. He indicated that both of his daughters are alive. He indicated that the status of his neg hx is unknown.    SOCIAL HISTORY: He  reports that he quit smoking about 40 years ago. His smoking use included Cigarettes. He has a 30.00 pack-year smoking history. He has never used smokeless tobacco. He reports that he drinks about 1.5 oz of alcohol per week . He reports that he does not use drugs.  REVIEW OF SYSTEMS:  Bolds positive  Constitutional: weight loss, weight gain, night sweats, fevers, chills, fatigue, weakness.  HEENT:headaches, sore throat, sneezing, nasal congestion, post nasal drip, difficulty swallowing, tooth/dental problems, visual complaints, visual changes, ear aches. Neuro:difficulty with speech, weakness, numbness, ataxia. CV: chest pain, orthopnea, PND, swelling in lower extremities, dizziness, palpitations, syncope.  Resp:cough, hemoptysis, dyspnea, wheezing. GI: heartburn, indigestion, abdominal pain, nausea, vomiting, diarrhea, constipation, change in bowel habits, loss of appetite, hematemesis, melena, hematochezia.  GH:WEXHBZJ, change in color of urine, urgency or frequency, flank pain, hematuria. MSK: joint pain or  swelling, decreased range of motion. Psych:change in mood or affect, depression, anxiety, suicidal ideations, homicidal ideations. Skin: rash, itching, bruising.  SUBJECTIVE:    VITAL SIGNS: BP (!) 162/132   Pulse (!) 157   Resp (!) 37   Ht '5\' 8"'$  (1.727 m)   Wt 92.6 kg (204 lb 2.3 oz)   SpO2 100%   BMI 31.04 kg/m   HEMODYNAMICS:    VENTILATOR SETTINGS:    INTAKE / OUTPUT: No intake/output data recorded.  PHYSICAL EXAMINATION: General: Adult male, in mild resp distress. Neuro: A&O x 3, non-focal.  HEENT: [place]/AT. PERRL, sclerae anicteric. Cardiovascular: IRIR, no M/R/G.  Lungs: Respirations  shallow and mildly labored. Faint rhonchi bilateral bases. Abdomen: Abd distended, BS absent. Tender throughout. Musculoskeletal: No gross deformities, BLE edema R > L. Skin: Intact, warm.  LABS:  BMET  Recent Labs Lab 07/11/16 0405 07/12/16 0413 07/13/16 0956  NA 139 136 147*  K 3.3* 3.8 3.4*  CL 107 105 112*  CO2 26 18* 27  BUN 18 18 23*  CREATININE 0.54* 0.77 0.81  GLUCOSE 153* 229* 152*    Electrolytes  Recent Labs Lab 07/10/16 0608 07/11/16 0405 07/12/16 0413 07/13/16 0956  CALCIUM 7.3* 7.5* 7.7* 8.2*  MG 2.1 2.1 2.0  --   PHOS 1.6* 1.5* 2.6 3.1    CBC  Recent Labs Lab 07/10/16 0608 07/11/16 0405 07/12/16 1332  WBC 7.1 6.8 11.8*  HGB 7.8* 7.3* 11.7*  HCT 24.3* 22.1* 34.9*  PLT 41* 54* 73*    Coag's No results for input(s): APTT, INR in the last 168 hours.  Sepsis Markers  Recent Labs Lab 07/07/16 0511  PROCALCITON <0.10    ABG No results for input(s): PHART, PCO2ART, PO2ART in the last 168 hours.  Liver Enzymes  Recent Labs Lab 07/10/16 0608 07/11/16 0405 07/12/16 0413  AST 17 16 45*  ALT 13* 14* 27  ALKPHOS 60 64 97  BILITOT 0.7 0.3 0.7  ALBUMIN 1.7* 1.8* 2.2*    Cardiac Enzymes No results for input(s): TROPONINI, PROBNP in the last 168 hours.  Glucose  Recent Labs Lab 07/12/16 2330 07/13/16 0339 07/13/16 0743 07/13/16 1257 07/13/16 1705 07/13/16 2042  GLUCAP 144* 123* 122* 138* 165* 205*    Imaging Dg Chest Port 1 View  Result Date: 07/13/2016 CLINICAL DATA:  Possible aspiration 45 minutes ago. Respiratory distress. EXAM: PORTABLE CHEST 1 VIEW COMPARISON:  Single-view of the chest 07/07/2016. FINDINGS: Lung volumes are low. Small focus of discoid atelectasis is seen in the left mid lung zone. The right lung is clear. Small amount of fluid or thickening of the minor fissure noted. No pneumothorax or pleural effusion. Heart size is upper normal. Port-A-Cath is in place. IMPRESSION: No acute disease. Airspace opacity  right in the lung base seen on the comparison exam has resolved. Electronically Signed   By: Inge Rise M.D.   On: 07/13/2016 19:29   Dg Abd Portable 1v  Result Date: 07/13/2016 CLINICAL DATA:  Distended abdomen.  Follow-up. EXAM: PORTABLE ABDOMEN - 1 VIEW COMPARISON:  07/08/2016 FINDINGS: Dilated small bowel loops again noted compatible small-bowel or obstruction. There appears to be mild progression of small bowel dilatation. Stomach also is dilated. Colon is decompressed. IMPRESSION: Small-bowel obstruction pattern with mild progression since prior study. Stones also is significantly distended. Electronically Signed   By: Franchot Gallo M.D.   On: 07/13/2016 15:37     STUDIES:  11/2 MRI Head > Mets to Brain  12/20 CT abd/pelvis > Persistently dilated  fluid and gas filled small bowel loops to level of the right lower quadrant/ distal ilium with abrupt transition Venous US 12/21>Findings consistent with acute deep vein thrombosis involvingperoneal, popliteal, and distal femoral artery of the right lowerextremity 12/27 ABD Xray > Moderately dilated small bowel loops concerning for distal small bowel obstruction 12/28 > BUE venous duplex > Neg DVT   CULTURES: BCX2 12/15: bacteroides vulgatus BCX2 12/20>>>negative  ANTIBIOTICS: Merrem 12/20>>> 12/27 Flagyl 12/21>> 12/26  SIGNIFICANT EVENTS: 12/15 > Admit Dyspnea and ABD pain > GNR bacteremia  12/20 > Progressive hypotension > CT ABD ischemia vs infectious colitis  12/21 > Stop oral anticoagulation > GIB r/t colitis > Temp Filter placed  12/29 > To [facility]  1/3 > D/C to Rehab > Readmit for Aspiration event   LINES/TUBES: Right port   DISCUSSION:   ASSESSMENT / PLAN:  PULMONARY A: Acute Hypoxic Respiratory Failure secondary to possible aspiration event  H/O PE on Eliquis, Stage IV Lung Cancer  P:   Supplemental oxygen to maintain saturation >92 Abx / culture per ID section BD's Follow CXR  DNI  CARDIOVASCULAR A:   Chronic dCHF H/O hypotension  P:  Cardiac Monitoring  Continue Midodrine   RENAL A:   Hyponatremia  Hypokalemia  H/O BPH, Chronic prostatitis   P:   Trend BMP Replace electrolytes as needed  Cont DDAVP Strict I&O  GASTROINTESTINAL A:   Recurrent Distal Small bowel obstruction  Multiple Episodes of Emesis  Malnutrition  Dysphagia  H/O GERD  P:   NPO NGT to suction Nutrition Following  Continue TPN  PPI  PRN Zofran and Compazine  Surgery Following - planned surgery once stronger  HEMATOLOGIC A:   NSCLC (80% PDL1+)-->treating w/ Keytruda > Dr. [name]  H/o Multiple Myeloma w/ skull lesion s/p XRT 2016 H/o PE and DVT on Eliquis-->new DVTs on NOAC 12/21 > S/P temp filter 12/1 H/O HIT  P:  Trend CBC Argatroban per pharm Day team to please notify oncology  INFECTIOUS A:   Concern for Aspiration evening of 01/03 H/O Bacteroides Vulgatus bacteremia (beta lactamase +) >suspect transient bacterial translocation  P:   Trend Fever and WBC  Start Unasyn  Assess Sputum Culture   ENDOCRINE A:   DM  H/O DI  P:   Cont DDAVP, SSI, Decadron  NEUROLOGIC A:   Pain  P:   Fentanyl PRN  Georgann Housekeeper, AGACNP-BC [facility]/Critical [facility] Pager [phone] or [phone]  07/13/2016 10:29 PM

## 2016-07-13 NOTE — Progress Notes (Signed)
Ankit Lorie Phenix, MD Physician Signed Physical Medicine and Rehabilitation  PMR Pre-admission Date of Service: 07/13/2016 12:37 PM  Related encounter: ED to Hosp-Admission (Discharged) from 06/24/2016 in [place]       '[]'$ Hide copied text   Secondary [employer] PMR Admission Coordinator Pre-Admission Assessment  Patient: [name] is an 76 y.o., male MRN: [id number] DOB: Jun 30, 1941 Height: '5\' 9"'$  (175.3 cm) Weight: 90 kg (198 lb 6.6 oz)  Insurance Information HMO:      PPO:  Yes     PCP:       IPA:       80/20:       OTHER:   PRIMARY:  UHC medicare      Policy#: [id number]      Subscriber: [name] CM Name: Sharlett Iles      Phone#: [phone]     Fax#: [phone] (has EPIC access) Pre-Cert#: [id number]      Employer: Semi-retired, mortgages Benefits:  Phone #:  [phone]     Name:  [name]. Date: 07/11/16     Deduct:  $0      Out of Pocket Max: $4500 (met $0)      Life Max: none CIR: $345 days 1-5      SNF: $0 days 1-20; $160 days 21-49; $0 days 50-100 Outpatient: medical necessity     Co-Pay: $40/visit Home Health: 100%      Co-Pay: none DME: 80%     Co-Pay: 20% Providers: in Artist Information    Name Relation Home Work [place] Spouse [phone]  [phone]      Current Medical History  Patient Admitting Diagnosis:  Debility, SBO  History of Present Illness: A 76 year old male with history of MM, stage IVB NSCLC with mets to brain 04/2016 and ongoing chemo, recent PE--on Eliquis who was readmitted to [facility] 06/24/16 with recurrent SBO and severe abdominal pain. He was admitted to [facility] on 12/15 and NGT placed for decompression per [facility]. Hospital course complicated by projectile vomiting with hypotension due to sepsis form Bacteriodes vulgatus bacteremia, colitis with GIB--NOAC held and acute BLE DVTs. IVC filter placed and multiple attempts at initiating  diet unsuccessful therefore TPN started yesterday. He was set for surgery due to ongoing abdominal pain but felt to be high surgical risk due to significant debility. Dr. Marcello Moores recommends getting albumin closer to 3 prior to surgical intervention. Confusion resolving with clearing of mentation per notes. Therapy ongoing and limited by dizziness with orthostatic changes. CIR recommended for follow up therapy.   Patient's medical record from [facility] has been reviewed by the rehabilitation admission coordinator and physician.  Past Medical History      Past Medical History:  Diagnosis Date  . Adenocarcinoma of right lung, stage 4 ([place]) 05/27/2016  . Adenomatous polyp of colon 1996  . Arthritis    Osteoarthritis right knee, spine, wrist  . Arthritis of hand, right    Right thumb (Dr. Daylene Katayama)  . Back ache    r/o Multiple Myeloma  . Benign prostatic hypertrophy    (Dr. Karsten Ro in past)  . Degenerative disc disease   . Diverticulosis   . DVT (deep venous thrombosis) ([place]) 01/2014   R calf  . Encounter for antineoplastic chemotherapy 10/20/2015  . Erosive esophagitis   . GERD (gastroesophageal reflux disease)   . Hemorrhoids   . Hiatal hernia   .  History of chronic prostatitis   . History of melanoma    Back (Dr. Karlyn Agee); early melanoma R arm 03/2011  . Multiple myeloma ([facility]) 2013   skull lytic lesions--treated with radiation 07/2014  . Pulmonary embolism ([facility]) 01/2014  . Radiation 07/21/14-08/05/14   left occipital parietal skull 25 gray  . SCC (squamous cell carcinoma) 08/04/15   right ear;dysplastic nevus with severe atypia-chest    Family History   family history includes Dementia in his mother; Depression in his daughter; Diabetes in his father; Hypertension in his father; Prostate cancer in his maternal grandfather; Stroke in his father.  Prior Rehab/Hospitalizations Has the patient had major surgery during 100 days prior to admission?  Yes.  Patient reports that he had a biopsy and that he had an ICD filter placed.    Current Medications See MAR from [facility]  Patients Current Diet:   Clear, thin liquids  Precautions / Restrictions Precautions Precautions: Fall Precaution Comments: monitor BP, hypotensive Restrictions Weight Bearing Restrictions: No   Has the patient had 2 or more falls or a fall with injury in the past year?No  Prior Activity Level Community (5-7x/wk): Went out daily.  Was very active and walked daily.  Prior Functional Level Self Care: Did the patient need help bathing, dressing, using the toilet or eating?  Independent  Indoor Mobility: Did the patient need assistance with walking from room to room (with or without device)? Independent  Stairs: Did the patient need assistance with internal or external stairs (with or without device)? Independent  Functional Cognition: Did the patient need help planning regular tasks such as shopping or remembering to take medications? Independent  Home Assistive Devices / Equipment Home Assistive Devices/Equipment: Environmental consultant (specify type) Home Equipment: Bedside commode, Walker - 2 wheels, Shower seat - built in  Prior Device Use: Indicate devices/aids used by the patient prior to current illness, exacerbation or injury? Walker.  Has been using a walker since 11/17.   Prior Functional Level Current Functional Level  Bed Mobility Independent Other (Supervision)  Transfers Independent Min assist  Mobility - Walk/Wheelchair Independent Min assist (Ambulated 6 feet RW)  Upper Body Dressing Independent Min assist  Lower Body Dressing Independent Max assist  Grooming Independent Other (Set up)  Eating/Drinking Independent Other (Set up)  Toilet Transfer Independent Mod assist  Bladder Continence  WDL Condom catheter in place  Bowel Management WDL Last BM 07/12/16  Stair Climbing Independent Other (Not tried.)  Communication Intact  and verbal Verbal and intact  Memory Intact Intact  Cooking/Meal Prep Independent     Housework Independent   Money Management Independent   Driving Yes, independent     Special needs/care consideration BiPAP/CPAP No CPM No Continuous Drip IV TPN/TNA, plans are for cyclic TPN/TNA Dialysis No        Life Vest No Oxygen No Special Bed No Trach Size No Wound Vac (area) No     Skin No                             Bowel mgmt: Last BM 07/12/16 Bladder mgmt: Condom catheter Diabetic mgmt Yes, on oral medications at home.  Is on insulin in hospital.  Previous Home Environment Living Arrangements: Spouse/significant other Available Help at Discharge: Family Type of Home: House Home Layout: One level Home Access: Stairs to enter Entrance Stairs-Rails: None Secretary/administrator of Steps: 3 Bathroom Shower/Tub: Health visitor: Standard Home Care Services:  No  Discharge Living Setting Plans for Discharge Living Setting: Patient's home, House, Lives with (comment) (Lives with wife.) Type of Home at Discharge: House Discharge Home Layout: Two level, Able to live on main level with bedroom/bathroom Alternate Level Stairs-Number of Steps: Flight Discharge Home Access: Stairs to enter Entrance Stairs-Number of Steps: 6 Does the patient have any problems obtaining your medications?: No  Social/Family/Support Systems Patient Roles: Spouse, Parent (Has a wife, 2 dtrs and 4 grand dtrs.) Contact Information: Berl Bonfanti - wife Anticipated Caregiver: wife Anticipated Caregiver's Contact Information: [name] - wife - (h) [phone] (c) [phone] Ability/Limitations of Caregiver: Wife is retired and can assist. Careers adviser: 24/7 Discharge Plan Discussed with Primary Caregiver: Yes Is Caregiver In Agreement with Plan?: Yes Does Caregiver/Family have Issues with Lodging/Transportation while Pt is in Rehab?: No  Goals/Additional Needs Patient/Family Goal  for Rehab: PT/OT mod I and supervision goals Expected length of stay: 7-10 days Cultural Considerations: None Dietary Needs: Clear thin liquids Equipment Needs: TBD Special Service Needs: Patient is being followed by general surgery.  Patient needs abdominal surgery once he is stronger and once albumin is improved.  Likely will not discharge home.  Will discharge back to acute for surgery. Pt/Family Agrees to Admission and willing to participate: Yes Program Orientation Provided & Reviewed with Pt/Caregiver Including Roles  & Responsibilities: Yes  Patient Condition: I met with patient at his bedside and discussed rehab venues.  Patient was very interested and agreed to inpatient rehab admission.  He needs medical management due to ongoing issues keeping patient in the acute care setting.  He can tolerate and will benefit from 3 hours of therapy a day.  He needs the coordinated team approach of inpatient rehab setting to regain his strength, return lab levels to normal, and to prepare patient for his next step of abdominal surgery.  I have discussed this case with rehab MD and I have approval for acute inpatient rehab admission for today.              Preadmission Screen Completed By:  Retta Diones, 07/13/2016 12:58 PM ______________________________________________________________________   Discussed status with Dr. Posey Pronto on  07/14/15 at 68 and received telephone approval for admission today.  Admission Coordinator:  Retta Diones, time 1309/Date 07/13/16   Assessment/Plan: Diagnosis: Debility 1. Does the need for close, 24 hr/day  Medical supervision in concert with the patient's rehab needs make it unreasonable for this patient to be served in a less intensive setting? Yes 2. Co-Morbidities requiring supervision/potential complications: MM, stage IVB NSCLC with mets to brain with ongoing chemo, recent PE, recurrent SBO 3. Due to bowel management, safety, disease management, pain  management and patient education, does the patient require 24 hr/day rehab nursing? Yes 4. Does the patient require coordinated care of a physician, rehab nurse, PT (1-2 hrs/day, 5 days/week) and OT (1-2 hrs/day, 5 days/week) to address physical and functional deficits in the context of the above medical diagnosis(es)? Yes Addressing deficits in the following areas: balance, endurance, locomotion, strength, transferring, bathing, dressing, toileting and psychosocial support 5. Can the patient actively participate in an intensive therapy program of at least 3 hrs of therapy 5 days a week? Yes 6. The potential for patient to make measurable gains while on inpatient rehab is excellent 7. Anticipated functional outcomes upon discharge from inpatients are: modified independent and supervision PT, supervision and min assist OT, n/a SLP 8. Estimated rehab length of stay to reach the above functional goals is: 10-14  days. 9. Does the patient have adequate social supports to accommodate these discharge functional goals? Yes 10. Anticipated D/C setting: Home 11. Anticipated post D/C treatments: HH therapy and Home excercise program 12. Overall Rehab/Functional Prognosis: good and fair    RECOMMENDATIONS: This patient's condition is appropriate for continued rehabilitative care in the following setting: CIR Patient has agreed to participate in recommended program. Yes Note that insurance prior authorization may be required for reimbursement for recommended care.  Retta Diones 07/13/2016  Delice Lesch, MD, [facility]

## 2016-07-13 NOTE — Progress Notes (Signed)
Oncology Nurse Navigator Documentation  Oncology Nurse Navigator Flowsheets 07/13/2016  Navigator Location [id number]  Navigator Encounter Type Other/I went to see [name] today.  He was resting comfortably.  Offered education and support regarding next steps.  [name] wife would like to speak with Dr. [name].  I updated him.    Patient Visit Type Inpatient  Treatment Phase Other  Barriers/Navigation Needs Education  Education Other  Interventions Education;Psycho-social support  Education Method Verbal  Acuity Level 2  Acuity Level 2 Educational needs;Other  Time Spent with Patient 30

## 2016-07-13 NOTE — Progress Notes (Addendum)
Pt noted with rhonchi and N/V, emesis noted to be dark brown fluid, pt noted to have difficulty maintaining O2 sat.  Contacted [facility], PAC and new order for NG.  Attempted NG tube placement x3 without success. Rapid response called and respiratory called, PAC notified unable to place NG tube. Respiratory placed re-breather, O2 sat returned to 90's, medical team at bedside and assumed medical care.  Collene Leyden Jaylyn Iyer

## 2016-07-13 NOTE — Progress Notes (Signed)
Rehab admissions - I have received authorization for acute inpatient rehab admission for today.  I have medical clearance from attending MD for inpatient rehab admission.  Bed available and will admit to acute inpatient rehab today.  Call me for questions.  #[phone]

## 2016-07-13 NOTE — Discharge Summary (Signed)
Physician Discharge Summary  [name] [facility]:[id number] DOB: 1940/10/31 DOA: 06/24/2016  PCP: [name], MD  Admit date: 06/24/2016 Discharge date: 07/13/2016  Time spent: 25* minutes  Recommendations for Outpatient Follow-up:  1. Patient to be discharged to CIR at [facility]  Discharge Diagnoses:  Principal Problem:   SBO (small bowel obstruction) Active Problems:   Hyponatremia   History of pulmonary embolism   Acute deep vein thrombosis (DVT) of tibial vein of right lower extremity (HCC)   Headache   Long term current use of anticoagulant therapy   Brain metastases (HCC)   Adenocarcinoma of right lung, stage 4 (HCC)   Hypokalemia   Thrombocytopenia (HCC)   Malnutrition of moderate degree   GERD (gastroesophageal reflux disease)   Chronic diastolic CHF (congestive heart failure) (HCC)   GIB (gastrointestinal bleeding)   Benign prostatic hyperplasia without lower urinary tract symptoms   Sepsis associated hypotension (HCC)   Generalized abdominal pain   DVT (deep vein thrombosis) in pregnancy [facility])   Discharge Condition: Stable  Diet recommendation: Continue TPN  Filed Weights   07/08/16 0500 07/09/16 0611 07/13/16 0455  Weight: 91.1 kg (200 lb 13.4 oz) 90.6 kg (199 lb 11.8 oz) 90 kg (198 lb 6.6 oz)    History of present illness:  76 year old with stage IV adenocarcinoma, arthritis, BPH, DVT. He has diagnosis of stage IVB(T1b, N2, M1c) non-small cell lung cancer, adenocarcinoma with metastases to the brain diagnosed in October 2017. He was undergoing chemotherapy with Prembrolizomab and recent dx of recurrent SBO, PE on eliquis who was admitted on 12/15 with severe abdominal pain. He was assessed by surgery who recommended conservative management with IVF and NG decompression.  On 06/29/16 he developed nausea and projectile vomiting, decreased blood pressure. Noted to have gram-negative rod bacteremia. He was transferred to stepdown for further management  and PCCM consulted.  PCCM asked to see on 12/20 when he became progressively hypotensive. CT imaging of abd raising concern for ischemia vs infectious colitis. New acute LE DVT in-spite of NOAC, which we had to stop 12/21 d/t colitis related GIB and hgb drop of almost 2 gms. Now s/p IVC filter. Surgery following and recommends against surgery and as of 12/27 he was tolerating a diet. Had some gas pain on 12/27, failed NG but feels OK 12/28 with normal bowel sounds and passing gas Patient Yesterday again started having vomiting, general surgery was consulted and patient again kept nothing by mouth for possible surgical intervention.  Hospital Course:  1. Small bowel obstruction-Recurrent,  patient has again been seen by surgery and plan for surgical intervention once he is more stable, and nutritionally tuned up. TNA has been started. 2. Colitis- seen on the CT abdomen ischemic versus infectious, patient was treated with Flagyl from  12/21 to 12/26. Ischemic colitis is suspected in setting of small bowel obstruction.  3. Non-small cell lung cancer- patient on chemotherapy, followed by oncology as outpatient. 4. History of PE/DVT- patient was on eliquis, he developed new DVTs on NOAC, status post IVC filter placement. Patient was started on Xarelto which was held for possible surgery, but as no surgery has been postponed.  Xarelto restarted. 5. Bacteroides Vulgatus bacteremia (beta lactamase +)- patient treated with antibiotics, currently off antibiotics. Repeat blood cultures on 06/29/2016 negative. 6. Diabetes insipidus- continue DDAVP, will change Solucortef to Decadron 4 mg po BID, his home dose. 7. Anemia- hemoglobin is 7.3, dropped from 8.7 on 12/24, likely dilutional.this morning hemoglobin 11.7. 8. Diabetes mellitus- start  sliding scale insulin with Novolog. 9. Hypotension- continue Midodrine 15 mg po tid.    Consultants:  General surgery  PCCM  Procedures: Venous US  12/21>>>Findings consistent with acute deep vein thrombosis involvingperoneal, popliteal, and distal femoral arteryof the right lowerextremity. - Findings consistent with acute deep vein thrombosis involving the popliteal and gastrocnemius veins of the left lower extremity. - Findings consistent with chronic deep vein thrombosis involving them proximal posterior tibial vein of the right lower extremity. - No evidence of a superficial thrombosis of the right or left lower extremity. CT abd/pelvis 12/20: Persistently dilated fluid and gas filled small bowel loops to level of the right lower quadrant/ distal ilium with abrupt transition without mass identified and therefore may be related to stricture/adhesion.Interval development of abnormal thickening of the ascending colon and proximal descending colon with surrounding inflammation of fat planes suggesting colitis whether inflammatory infectious or ischemic.  CULTURES: BCX2 12/15: bacteroides vulgatus BCX2 12/20>>>negative    Discharge Exam: Vitals:   07/13/16 0435 07/13/16 1030  BP: 111/75 108/69  Pulse: 83 (!) 106  Resp: 18 16  Temp: 98.4 F (36.9 C) 97.7 F (36.5 C)    General: Appears in no acute distress Cardiovascular: RRR, S1S2 Respiratory: Cle  Discharge Instructions   Discharge Instructions    Diet - low sodium heart healthy    Complete by:  As directed    Increase activity slowly    Complete by:  As directed      Current Discharge Medication List    START taking these medications   Details  !! acetaminophen (TYLENOL) 325 MG tablet Take 2 tablets (650 mg total) by mouth every 6 (six) hours as needed for mild pain (or Fever >/= 101).    insulin aspart (NOVOLOG) 100 UNIT/ML injection Inject 0-15 Units into the skin every 4 (four) hours. Qty: 10 mL, Refills: 11    prochlorperazine (COMPAZINE) 5 MG/ML injection Inject 1 mL (5 mg total) into the vein every 6 (six) hours as needed. Qty: 2 mL     rivaroxaban (XARELTO) 20 MG TABS tablet Take 1 tablet (20 mg total) by mouth daily with supper. Qty: 30 tablet     !! - Potential duplicate medications found. Please discuss with provider.    CONTINUE these medications which have NOT CHANGED   Details  !! acetaminophen (TYLENOL) 500 MG tablet Take 500 mg by mouth every 6 (six) hours as needed for moderate pain or headache.    ascorbic acid (VITAMIN C) 500 MG tablet Take 500 mg by mouth 2 (two) times daily.     cholecalciferol (VITAMIN D) 1000 UNITS tablet Take 1,000 Units by mouth at bedtime.     desmopressin (DDAVP) 0.1 MG tablet Take 1 tablet (0.1 mg total) by mouth at bedtime. Qty: 30 tablet, Refills: 0    dexamethasone (DECADRON) 4 MG tablet Take 1 tablet (4 mg total) by mouth 2 (two) times daily.    fish oil-omega-3 fatty acids 1000 MG capsule Take 1 g by mouth at bedtime.     furosemide (LASIX) 20 MG tablet Take 1 tablet (20 mg total) by mouth daily. Qty: 30 tablet, Refills: 0   Associated Diagnoses: Peripheral edema    LORazepam (ATIVAN) 0.5 MG tablet Take 1 tab PO Q 6 hours PRN nausea or anxiety Qty: 30 tablet, Refills: 0   Associated Diagnoses: Brain metastases (HCC)    methocarbamol (ROBAXIN) 500 MG tablet TAKE 1 TO 2 TABLETS BY MOUTH EVERY 6 HOURS AS NEEDED FOR MUSCLE SPASMS  Qty: 60 tablet, Refills: 0    Multiple Vitamin (MULTIVITAMIN WITH MINERALS) TABS Take 1 tablet by mouth at bedtime.     ondansetron (ZOFRAN) 8 MG tablet Take 1 tablet (8 mg total) by mouth every 8 (eight) hours as needed for nausea or vomiting. Qty: 30 tablet, Refills: 1   Associated Diagnoses: Brain metastases (HCC)    pantoprazole (PROTONIX) 40 MG tablet TAKE 1 TABLET BY MOUTH TWO  TIMES DAILY Qty: 180 tablet, Refills: 2    Probiotic Product (ALIGN) 4 MG CAPS Take 1 capsule by mouth daily. Reported on 09/10/2015    prochlorperazine (COMPAZINE) 10 MG tablet Take 1 tablet (10 mg total) by mouth every 6 (six) hours as needed. Qty: 30 tablet,  Refills: 1    tamsulosin (FLOMAX) 0.4 MG CAPS capsule Take 2 capsules (0.8 mg total) by mouth daily after supper. Qty: 60 capsule, Refills: 11    temazepam (RESTORIL) 15 MG capsule Take 1 capsule (15 mg total) by mouth at bedtime as needed. Qty: 60 capsule, Refills: 0   Associated Diagnoses: Multiple myeloma not having achieved remission ([place]); Multiple myeloma, remission status unspecified (HCC)    zolendronic acid (ZOMETA) 4 MG/5ML injection Inject 4 mg into the vein every 3 (three) months.      !! - Potential duplicate medications found. Please discuss with provider.    STOP taking these medications     apixaban (ELIQUIS) 5 MG TABS tablet      levofloxacin (LEVAQUIN) 500 MG tablet      oxyCODONE (ROXICODONE) 5 MG immediate release tablet      polyethylene glycol (MIRALAX) packet        Allergies  Allergen Reactions  . Tramadol Palpitations    "Made my chest feel tight"  . Amoxicillin     REACTION: rash  . Penicillins Rash    Has patient had a PCN reaction causing immediate rash, facial/tongue/throat swelling, SOB or lightheadedness with hypotension: unknown Has patient had a PCN reaction causing severe rash involving mucus membranes or skin necrosis: unknown Has patient had a PCN reaction that required hospitalization: no  Has patient had a PCN reaction occurring within the last 10 years: no  If all of the above answers are "NO", then may proceed with Cephalosporin use.       The results of significant diagnostics from this hospitalization (including imaging, microbiology, ancillary and laboratory) are listed below for reference.    Significant Diagnostic Studies: Ct Abdomen Pelvis Wo Contrast  Result Date: 06/29/2016 CLINICAL DATA:  76 year old male with stage IV adenocarcinoma of the lung with intracranial metastatic disease October 2017 undergoing chemotherapy. Recent diagnosis of recurrent small bowel obstruction. Pulmonary embolus on blood thinner. Subsequent  encounter. EXAM: CT ABDOMEN AND PELVIS WITHOUT CONTRAST TECHNIQUE: Multidetector CT imaging of the abdomen and pelvis was performed following the standard protocol without IV contrast. COMPARISON:  06/27/2016 plain film exam. 06/24/2016 CT scan of the chest, abdomen and pelvis. FINDINGS: Lower chest: Basilar atelectatic changes. Coronary artery calcifications. Hepatobiliary: Taking into account limitation by non contrast imaging, no worrisome mass. No calcified gallstones. Nodular area of enhancement gallbladder fundus noted on prior exam not appreciated on present exam. Pancreas: Taking into account limitation by non contrast imaging, no inflammation or mass. Spleen: Taking into account limitation by non contrast imaging, no enlargement or mass. Adrenals/Urinary Tract: Taking into account limitation by non contrast imaging, no adrenal or renal mass or evidence hydronephrosis. Scarring left kidney. Stomach/Bowel: Persistently dilated fluid and gas filled small bowel loops to  level of the right lower quadrant/ distal ilium with abrupt transition without mass identified and therefore may be related to stricture/adhesion. Interval development of abnormal thickening of the ascending colon and proximal descending colon with surrounding inflammation of fat planes suggesting colitis whether inflammatory infectious or ischemic. Vascular/Lymphatic: Atherosclerotic changes aorta with mild ectasia. Aneurysmal dilation common iliac arteries measuring 2.5 cm on the right and 2.3 cm on the left. No adenopathy. Reproductive: Slightly lobulated prostate gland with impression upon the bladder base. Decompressed urinary bladder with circumferential thickening anterior superior wall. This did not appear thickened on recent CT urinary bladder was distended. Other: Small amount of free fluid in the pelvis. Mild third spacing of fluid. No free intraperitoneal air. Musculoskeletal: Scoliosis and degenerative changes with Schmorl's node  deformities without new compression fracture or osseous destructive lesion. IMPRESSION: Persistently dilated fluid and gas filled small bowel loops to level of the right lower quadrant/ distal ilium with abrupt transition without mass identified and therefore may be related to stricture/adhesion. Interval development of abnormal thickening of the ascending colon and proximal descending colon with surrounding inflammation of fat planes suggesting colitis whether inflammatory infectious or ischemic. Small amount of free fluid in pelvis.  No free intraperitoneal air. Aneurysmal dilation common iliac arteries measuring 2.5 cm on the right and 2.3 cm on the left. Electronically Signed   By: Genia Del M.D.   On: 06/29/2016 17:03   Dg Chest 1 View  Result Date: 06/29/2016 CLINICAL DATA:  History of lung cancer, hypotension EXAM: CHEST 1 VIEW COMPARISON:  06/24/2016 FINDINGS: Cardiomediastinal silhouette is stable. Right IJ Port-A-Cath is unchanged in position. There is streaky mild interstitial prominence bilateral perihilar with slight improvement from prior exam. No segmental infiltrate. No convincing pulmonary edema. IMPRESSION: There is streaky mild interstitial prominence bilateral perihilar with slight improvement from prior exam. No segmental infiltrate. No convincing pulmonary edema. Stable right Port-A-Cath position. Electronically Signed   By: Lahoma Crocker M.D.   On: 06/29/2016 15:48   Dg Chest 2 View  Result Date: 06/24/2016 CLINICAL DATA:  Weakness and shortness of Breath EXAM: CHEST  2 VIEW COMPARISON:  06/19/2016 FINDINGS: Cardiac shadow is enlarged. Right chest wall port is again seen. Patchy infiltrates are again noted in the right lung. Improved aeration in the left base is noted. No new focal abnormality is seen. No bony abnormality is noted. IMPRESSION: Improved aeration on the left with stable infiltrates on the right. Electronically Signed   By: Inez Catalina M.D.   On: 06/24/2016 16:38    Dg Chest 2 View  Result Date: 06/19/2016 CLINICAL DATA:  Shortness of breath. Right chest pain at the Port-A-Cath site. Lung cancer on immunotherapy. EXAM: CHEST  2 VIEW COMPARISON:  06/06/2016 chest radiograph. FINDINGS: Right internal jugular Port-A-Cath terminates at the cavoatrial junction. Stable cardiomediastinal silhouette with normal heart size. No pneumothorax. No pleural effusion. There is new patchy consolidation in the anterior right mid lung. Stable hazy bibasilar lung opacities. IMPRESSION: 1. New patchy anterior right midlung consolidation, differential includes infection or drug reaction. 2. Stable mild bibasilar hazy opacities, favor scarring or atelectasis. Electronically Signed   By: Ilona Sorrel M.D.   On: 06/19/2016 12:56   Dg Abd 1 View  Result Date: 07/06/2016 CLINICAL DATA:  Acute generalized abdominal pain. EXAM: ABDOMEN - 1 VIEW COMPARISON:  Radiographs of June 27, 2016. FINDINGS: Moderately dilated small bowel loops are now seen concerning for distal small bowel obstruction. IVC filter is noted. Phleboliths are noted in the pelvis.  IMPRESSION: Moderately dilated small bowel loops are noted concerning for distal small bowel obstruction. Follow-up radiographs recommended. Electronically Signed   By: Marijo Conception, M.D.   On: 07/06/2016 13:43   Dg Abd 1 View  Result Date: 06/27/2016 CLINICAL DATA:  Small-bowel obstruction EXAM: ABDOMEN - 1 VIEW COMPARISON:  05/26/2016 FINDINGS: Nasogastric catheter remains in the stomach. Scattered large and small bowel gas is noted. Contrast from recent CT examination now lies within the distal colon. The degree of small bowel obstruction has resolved. No free air is seen.Degenerative changes of the lumbar spine are noted. IMPRESSION: Scattered large and small bowel gas. No definitive obstructive changes are seen. Electronically Signed   By: Inez Catalina M.D.   On: 06/27/2016 08:37   Dg Abd 1 View  Result Date: 06/25/2016 CLINICAL  DATA:  Small bowel obstruction EXAM: ABDOMEN - 1 VIEW COMPARISON:  06/25/2016 FINDINGS: Dilated small bowel loops noted within the abdomen compatible with small bowel obstruction. There may have been slight interval improvement in degree of distention. NG tube tip is in the proximal stomach with the side port near the GE junction. IMPRESSION: Small bowel obstruction pattern, slightly improved since prior study. Electronically Signed   By: Rolm Baptise M.D.   On: 06/25/2016 08:17   Ct Head Wo Contrast  Result Date: 06/24/2016 CLINICAL DATA:  Weakness, nausea, shortness of breath, diagnosed with pulmonary embolism on 06/19/2016, headache, history melanoma, multiple myeloma, lung cancer EXAM: CT HEAD WITHOUT CONTRAST TECHNIQUE: Contiguous axial images were obtained from the base of the skull through the vertex without intravenous contrast. COMPARISON:  06/27/2016 FINDINGS: Brain: Generalized atrophy. Normal ventricular morphology. No midline shift or mass effect. Small vessel chronic ischemic changes of deep cerebral white matter. Previously identified metastases within the RIGHT cerebellum and RIGHT occipital lobe on prior contrast-enhanced CT and prior MR brain not identified by this noncontrast exam. No intracranial hemorrhage, visualized mass lesion, evidence acute infarction, or extra-axial fluid collection. Vascular: Unremarkable Skull: Intact Sinuses/Orbits: Probable small osteoma of the LEFT frontal sinus. Remaining paranasal sinuses and mastoid air cells clear. Other: N/A IMPRESSION: Atrophy with small vessel chronic ischemic changes of deep cerebral white matter. No acute intracranial abnormalities. Enhancing lesions within the RIGHT cerebellum and RIGHT occipital lobe consistent with metastases as identified on the previous exam are not identified by the current noncontrast study. Electronically Signed   By: Lavonia Dana M.D.   On: 06/24/2016 17:54   Ct Angio Chest Pe W And/or Wo Contrast  Result  Date: 06/24/2016 CLINICAL DATA:  Shortness of breath. Recently diagnosed with pulmonary embolism. Taking Eliquis. Right chest pain. Stage IV metastatic small cell lung cancer. Multiple myeloma. EXAM: CT ANGIOGRAPHY CHEST CT ABDOMEN AND PELVIS WITH CONTRAST TECHNIQUE: Multidetector CT imaging of the chest was performed using the standard protocol during bolus administration of intravenous contrast. Multiplanar CT image reconstructions and MIPs were obtained to evaluate the vascular anatomy. Multidetector CT imaging of the abdomen and pelvis was performed using the standard protocol during bolus administration of intravenous contrast. CONTRAST:  100 cc Isovue 370 COMPARISON:  Chest CTA dated 06/19/2016 and abdomen and pelvis CT dated 06/10/2016. FINDINGS: CTA CHEST FINDINGS Cardiovascular: Mildly progressive subsegmental pulmonary embolus in the right lower lobe. The remainder of the previously demonstrated segmental and subsegmental right lower lobe and right upper lobe pulmonary emboli have not changed significantly. No definite left lower lobe pulmonary emboli are seen today. Mediastinum/Nodes: The previously demonstrated hypodense left lobe thyroid nodule is not clearly visible today. The  previously demonstrated 1.5 cm short axis right peritracheal node has a short axis diameter of 1.5 cm on image number 30 of series 4. The previously demonstrated 1.2 cm short axis subcarinal node right has a short axis diameter of 1.4 cm on image number 38 of series 4. The previously demonstrated 1.7 cm short axis diameter right hilar node has a short axis diameter of 1.8 cm on image number 38 of series 4. A previously demonstrated 1.2 cm short axis left hilar node has a short axis diameter of 1.3 cm on image number 40 of series 4. Lungs/Pleura: No significant change in a small right pleural effusion. The previously demonstrated 1.5 x 1.2 cm irregular central right upper lobe nodule 1 measures 1.4 x 1.0 cm on image number 33 of  series 12. The previously demonstrated 3 mm right middle lobe nodule measures 6 mm on image number 38 of series 12. Mildly progressive consolidated right middle lobe atelectasis. Progressive patchy interstitial opacities in both upper lobes. Mild right lower lobe atelectasis with improvement. Musculoskeletal: Thoracic spine degenerative changes. Review of the MIP images confirms the above findings. CT ABDOMEN and PELVIS FINDINGS Hepatobiliary: No focal liver abnormality is seen. No gallstones, gallbladder wall thickening, or biliary dilatation. A small gallbladder fundal nodule with enhancement is again demonstrated. Pancreas: Diffusely atrophied. Spleen: Normal in size without focal abnormality. Adrenals/Urinary Tract: Stable left renal cortical scar. Normal appearing adrenal glands and right kidney. Distended urinary bladder with mildly dilated ureters. No hydronephrosis. Stomach/Bowel: Interval multiple dilated proximal small bowel loops with normal caliber distal small bowel loops. Unremarkable stomach and colon. No evidence of appendicitis. Vascular/Lymphatic: Mild atheromatous arterial calcifications, including the abdominal aorta. No enlarged lymph nodes. Reproductive: Minimally enlarged prostate gland. Other: Tiny amount of free peritoneal fluid in the posterior pelvis on the right. Musculoskeletal: Lumbar spine degenerative changes. Review of the MIP images confirms the above findings. IMPRESSION: 1. Mildly progressive right lower lobe pulmonary emboli. 2. Stable central right upper lobe pulmonary emboli. 3. Interval obstruction of the mid small bowel. The cause of obstruction is not identified. This is most likely due to an adhesion. 4. Stable small enhancing mass in the gallbladder fundus. 5. Progressive patchy interstitial opacities in both lungs. This could be due to infection, drug allergy, or patchy interstitial edema. 6. Stable small right pleural effusion with decreased right lower lobe atelectasis.  7. Little change in 2 right lung nodules. 8. Little change in mediastinal and bilateral hilar adenopathy. 9. Mild aortic atherosclerosis. Electronically Signed   By: Claudie Revering M.D.   On: 06/24/2016 18:30   Ct Angio Chest Pe W And/or Wo Contrast  Result Date: 06/19/2016 CLINICAL DATA:  76 year old male with stage IV non-small-cell lung carcinoma diagnosed 05/11/2016, with ongoing immunotherapy, presenting with chest pain and dyspnea. Additional history of multiple myeloma and bone marrow transplant in 2014. EXAM: CT ANGIOGRAPHY CHEST WITH CONTRAST TECHNIQUE: Multidetector CT imaging of the chest was performed using the standard protocol during bolus administration of intravenous contrast. Multiplanar CT image reconstructions and MIPs were obtained to evaluate the vascular anatomy. CONTRAST:  62 cc Isovue 370 IV. COMPARISON:  Chest radiograph from earlier today. 05/05/2016 PET-CT. 04/28/2016 CT chest, abdomen and pelvis. FINDINGS: Cardiovascular: The study is high quality for the evaluation of pulmonary embolism. There are multiple acute segmental and subsegmental pulmonary emboli in the right upper lobe and right lower lobe. Probable subsegmental acute pulmonary embolus in the left lower lobe. No central pulmonary emboli . Atherosclerotic nonaneurysmal thoracic aorta. Main pulmonary  artery diameter 3.4 cm, stable from 04/28/2016, mildly dilated. Normal heart size. No significant pericardial fluid/thickening. Left anterior descending coronary atherosclerosis. Right internal jugular MediPort terminates at the cavoatrial junction. Mediastinum/Nodes: Hypodense 1.0 cm left thyroid lobe nodule, not appreciably changed. Unremarkable esophagus. No axillary adenopathy. Right paratracheal adenopathy measuring up to 1.5 cm (series 4/ image 31), slightly increased from 1.2 cm on 04/28/2016. Subcarinal enlarged 1.2 cm node (series 4/ image 41), stable. Mildly enlarged 1.0 cm left paratracheal node (series 4/ image 33),  stable. Right hilar adenopathy measuring up to 1.7 cm (series 4/ image 40), stable. Left hilar adenopathy measuring up to 1.2 cm (series 4/ image 43), previously 0.9 cm, slightly increased . Lungs/Pleura: No pneumothorax. Small right pleural effusion. No left pleural effusion. Central right upper lobe irregular 1.5 x 1.2 cm pulmonary nodule (series 11/ image 37), previously 1.5 x 1.1 cm on 04/28/2016, stable. New 3 mm solid right middle lobe pulmonary nodule (series 11/ image 52). New patchy consolidation and ground-glass attenuation throughout the basilar right upper lobe. New mild patchy peripheral consolidation and ground-glass attenuation in the left upper lobe and bilateral lower lobes. Mild compressive atelectasis in the dependent right lower lobe. Upper abdomen: Unremarkable. Musculoskeletal: No aggressive appearing focal osseous lesions. Marked thoracic spondylosis. Review of the MIP images confirms the above findings. IMPRESSION: 1. Acute segmental and subsegmental pulmonary embolism asymmetrically involving the right lung. 2. Stable chronic main pulmonary artery dilation, suggesting chronic pulmonary arterial hypertension. Normal heart size. 3. Central right upper lobe 1.5 cm irregular pulmonary nodule is stable and suspected to represent the primary bronchogenic malignancy. 4. Bilateral hilar and mediastinal nodal metastases are stable to mildly increased. 5. Small right pleural effusion. 6. New patchy consolidation and ground-glass attenuation in both lungs, most prominent in the basilar right upper lobe, nonspecific, differential includes multifocal infection, drug reaction and/or pulmonary infarcts. 7. Aortic atherosclerosis.  One vessel coronary atherosclerosis. Critical Value/emergent results were called by telephone at the time of interpretation on 06/19/2016 at 2:02 pm to Dr. Duffy Bruce , who verbally acknowledged these results. Electronically Signed   By: Ilona Sorrel M.D.   On: 06/19/2016  14:03   Ct Abdomen Pelvis W Contrast  Result Date: 06/24/2016 CLINICAL DATA:  Shortness of breath. Recently diagnosed with pulmonary embolism. Taking Eliquis. Right chest pain. Stage IV metastatic small cell lung cancer. Multiple myeloma. EXAM: CT ANGIOGRAPHY CHEST CT ABDOMEN AND PELVIS WITH CONTRAST TECHNIQUE: Multidetector CT imaging of the chest was performed using the standard protocol during bolus administration of intravenous contrast. Multiplanar CT image reconstructions and MIPs were obtained to evaluate the vascular anatomy. Multidetector CT imaging of the abdomen and pelvis was performed using the standard protocol during bolus administration of intravenous contrast. CONTRAST:  100 cc Isovue 370 COMPARISON:  Chest CTA dated 06/19/2016 and abdomen and pelvis CT dated 06/10/2016. FINDINGS: CTA CHEST FINDINGS Cardiovascular: Mildly progressive subsegmental pulmonary embolus in the right lower lobe. The remainder of the previously demonstrated segmental and subsegmental right lower lobe and right upper lobe pulmonary emboli have not changed significantly. No definite left lower lobe pulmonary emboli are seen today. Mediastinum/Nodes: The previously demonstrated hypodense left lobe thyroid nodule is not clearly visible today. The previously demonstrated 1.5 cm short axis right peritracheal node has a short axis diameter of 1.5 cm on image number 30 of series 4. The previously demonstrated 1.2 cm short axis subcarinal node right has a short axis diameter of 1.4 cm on image number 38 of series 4. The previously demonstrated  1.7 cm short axis diameter right hilar node has a short axis diameter of 1.8 cm on image number 38 of series 4. A previously demonstrated 1.2 cm short axis left hilar node has a short axis diameter of 1.3 cm on image number 40 of series 4. Lungs/Pleura: No significant change in a small right pleural effusion. The previously demonstrated 1.5 x 1.2 cm irregular central right upper lobe  nodule 1 measures 1.4 x 1.0 cm on image number 33 of series 12. The previously demonstrated 3 mm right middle lobe nodule measures 6 mm on image number 38 of series 12. Mildly progressive consolidated right middle lobe atelectasis. Progressive patchy interstitial opacities in both upper lobes. Mild right lower lobe atelectasis with improvement. Musculoskeletal: Thoracic spine degenerative changes. Review of the MIP images confirms the above findings. CT ABDOMEN and PELVIS FINDINGS Hepatobiliary: No focal liver abnormality is seen. No gallstones, gallbladder wall thickening, or biliary dilatation. A small gallbladder fundal nodule with enhancement is again demonstrated. Pancreas: Diffusely atrophied. Spleen: Normal in size without focal abnormality. Adrenals/Urinary Tract: Stable left renal cortical scar. Normal appearing adrenal glands and right kidney. Distended urinary bladder with mildly dilated ureters. No hydronephrosis. Stomach/Bowel: Interval multiple dilated proximal small bowel loops with normal caliber distal small bowel loops. Unremarkable stomach and colon. No evidence of appendicitis. Vascular/Lymphatic: Mild atheromatous arterial calcifications, including the abdominal aorta. No enlarged lymph nodes. Reproductive: Minimally enlarged prostate gland. Other: Tiny amount of free peritoneal fluid in the posterior pelvis on the right. Musculoskeletal: Lumbar spine degenerative changes. Review of the MIP images confirms the above findings. IMPRESSION: 1. Mildly progressive right lower lobe pulmonary emboli. 2. Stable central right upper lobe pulmonary emboli. 3. Interval obstruction of the mid small bowel. The cause of obstruction is not identified. This is most likely due to an adhesion. 4. Stable small enhancing mass in the gallbladder fundus. 5. Progressive patchy interstitial opacities in both lungs. This could be due to infection, drug allergy, or patchy interstitial edema. 6. Stable small right pleural  effusion with decreased right lower lobe atelectasis. 7. Little change in 2 right lung nodules. 8. Little change in mediastinal and bilateral hilar adenopathy. 9. Mild aortic atherosclerosis. Electronically Signed   By: Claudie Revering M.D.   On: 06/24/2016 18:30   Ir Ivc Filter Plmt / S&i /img Guid/mod Sed  Result Date: 06/30/2016 INDICATION: 76 year old male with a history of prior DVT and pulmonary embolism currently on anticoagulation via Eliquis. Unfortunately, he developed bright red blood per rectum recently and CT imaging suggests colitis. Therefore, his anticoagulation has been stopped. Repeat bilateral lower extremity ultrasound demonstrates bilateral acute DVT. Therefore, he requires caval interruption for PE prophylaxis. Of note, he has a history of multiple myeloma end stage IV lung adenocarcinoma. EXAM: ULTRASOUND GUIDANCE FOR VASCULARACCESS IVC CATHETERIZATION AND VENOGRAM IVC FILTER INSERTION Interventional Radiologist:  Criselda Peaches, MD MEDICATIONS: None. ANESTHESIA/SEDATION: Fentanyl 50 mcg IV; Versed 2.5 mg IV Moderate Sedation Time:  11 minutes The patient was continuously monitored during the procedure by the interventional radiology nurse under my direct supervision. FLUOROSCOPY TIME:  Fluoroscopy Time: 1 minutes 48 seconds (182 mGy). COMPLICATIONS: None immediate. PROCEDURE: Informed written consent was obtained from the patient after a thorough discussion of the procedural risks, benefits and alternatives. All questions were addressed. Maximal Sterile Barrier Technique was utilized including caps, mask, sterile gowns, sterile gloves, sterile drape, hand hygiene and skin antiseptic. A timeout was performed prior to the initiation of the procedure. Maximal barrier sterile technique utilized including  caps, mask, sterile gowns, sterile gloves, large sterile drape, hand hygiene, and Betadine prep. Under sterile condition and local anesthesia, right internal jugular venous access was  performed with ultrasound. An ultrasound image was saved and sent to PACS. Over a guidewire, the IVC filter delivery sheath and inner dilator were advanced into the IVC just above the IVC bifurcation. Contrast injection was performed for an IVC venogram. Through the delivery sheath, a retrievable Denali IVC filter was deployed below the level of the renal veins and above the IVC bifurcation. Limited post deployment venacavagram was performed. The delivery sheath was removed and hemostasis was obtained with manual compression. A dressing was placed. The patient tolerated the procedure well without immediate post procedural complication. FINDINGS: The IVC is patent. No evidence of thrombus, stenosis, or occlusion. No variant venous anatomy. Successful placement of the IVC filter below the level of the renal veins. IMPRESSION: Successful ultrasound and fluoroscopically guided placement of an infrarenal retrievable IVC filter via right jugular approach. PLAN: This IVC filter is potentially retrievable. The patient will be assessed for filter retrieval by Interventional Radiology in approximately 8-12 weeks. Further recommendations regarding filter retrieval, continued surveillance or declaration of device permanence, will be made at that time. Signed, Criselda Peaches, MD [facility] Electronically Signed   By: Jacqulynn Cadet M.D.   On: 06/30/2016 16:59   Dg Chest Port 1 View  Result Date: 07/07/2016 CLINICAL DATA:  Respiratory failure EXAM: PORTABLE CHEST 1 VIEW COMPARISON:  July 06, 2016 FINDINGS: Port-A-Cath tip is at the cavoatrial junction. No pneumothorax. There is stable patchy airspace opacity in the right lower lung zone and left mid lung region. Lungs elsewhere clear. Heart is upper normal in size with pulmonary vascularity within normal limits. There is atherosclerotic calcification aorta. No adenopathy evident. Bones appear somewhat  osteoporotic. IMPRESSION: Patchy infiltrate in the right lower lung zone and left mid lung regions, stable. No pneumothorax. Stable cardiac silhouette. There is aortic atherosclerosis. Electronically Signed   By: Lowella Grip III M.D.   On: 07/07/2016 07:45   Dg Chest Port 1 View  Result Date: 07/06/2016 CLINICAL DATA:  Respiratory failure. EXAM: PORTABLE CHEST 1 VIEW COMPARISON:  07/01/2016. FINDINGS: PowerPort catheter noted with tip projected over right atrium in stable position. Stable cardiomegaly. Partial clearing of bilateral upper lobe infiltrates. Progression of right base atelectasis and infiltrate. Small right pleural effusion. No pneumothorax. Questionable bowel distention noted. No free air noted. IMPRESSION: 1. PowerPort catheter noted in stable position . 2. Partial clearing of bilateral upper lobe infiltrates. Progression of right base atelectasis and infiltrate. Small right pleural effusion. 3. Questionable bowel distention. Abdominal series can be obtained if needed. Electronically Signed   By: Marcello Moores  Register   On: 07/06/2016 06:45   Dg Chest Port 1 View  Result Date: 07/01/2016 CLINICAL DATA:  Pneumonia. EXAM: PORTABLE CHEST 1 VIEW COMPARISON:  06/29/2016. FINDINGS: PowerPort catheter noted with lead tip projected over the right atrium. Heart size stable. Bilateral upper lobe and right base infiltrates again noted. Slight progression from prior exam. No prominent pleural effusion or pneumothorax. IMPRESSION: 1. PowerPort catheter in stable position . 2. Bilateral upper lobe and right base infiltrates. Slight progression from prior exam Electronically Signed   By: Marcello Moores  Register   On: 07/01/2016 06:51   Dg Abd 2 Views  Result Date: 07/08/2016 CLINICAL DATA:  Abdominal pain, nausea, vomiting. EXAM: ABDOMEN - 2 VIEW COMPARISON:  None. FINDINGS: Dilated small bowel loops are again noted  consistent with distal small bowel obstruction. IVC filter is noted. Phleboliths are noted  in the pelvis. No free air is noted suggest pneumoperitoneum. IMPRESSION: Stable small bowel dilatation is noted consistent with distal small bowel obstruction. Electronically Signed   By: Marijo Conception, M.D.   On: 07/08/2016 18:30   Dg Abd Portable 1v-small Bowel Obstruction Protocol-initial, 8 Hr Delay  Result Date: 06/25/2016 CLINICAL DATA:  Small bowel obstruction. EXAM: PORTABLE ABDOMEN - 1 VIEW COMPARISON:  June 25, 2016 FINDINGS: The side port of the NG tube is near the GE junction, unchanged. Continued mild small bowel obstruction. IMPRESSION: Continued small bowel obstruction. The side port of the NG tube is near the GE junction, unchanged. Electronically Signed   By: Dorise Bullion III M.D   On: 06/25/2016 17:43   Dg Abd Portable 1v  Result Date: 06/25/2016 CLINICAL DATA:  76 year old male with small bowel obstruction status post NG tube placement. EXAM: PORTABLE ABDOMEN - 1 VIEW COMPARISON:  Abdominal CT dated 06/24/2016 FINDINGS: An enteric tube is partially visualized with side port and tip over the epigastric area likely in the proximal stomach. Recommend advancing the tube by approximately 4 cm for optimal positioning. Multiple dilated air-filled loops of small bowel again noted measuring up to 5 cm in diameter. No free air or radiopaque calculi identified. There is osteopenia with advanced degenerative changes of the spine. No acute fracture. IMPRESSION: Enteric tube with tip and side-port likely in the proximal stomach. Persistent dilated small bowel loops.  Follow-up recommended. Electronically Signed   By: Anner Crete M.D.   On: 06/25/2016 02:03    Microbiology: No results found for this or any previous visit (from the past 240 hour(s)).   Labs: Basic Metabolic Panel:  Recent Labs Lab 07/09/16 1220 07/10/16 0608 07/11/16 0405 07/12/16 0413 07/13/16 0956  NA 143 140 139 136 PENDING  K 3.3* 3.8 3.3* 3.8 3.4*  CL 107 108 107 105 112*  CO2 '27 25 26 '$ 18* 27   GLUCOSE 101* 133* 153* 229* 152*  BUN 22* '20 18 18 '$ 23*  CREATININE 0.56* 0.56* 0.54* 0.77 0.81  CALCIUM 7.5* 7.3* 7.5* 7.7* 8.2*  MG 2.3 2.1 2.1 2.0  --   PHOS 2.7 1.6* 1.5* 2.6 3.1   Liver Function Tests:  Recent Labs Lab 07/09/16 1220 07/10/16 0608 07/11/16 0405 07/12/16 0413  AST 13* 17 16 45*  ALT 16* 13* 14* 27  ALKPHOS 67 60 64 97  BILITOT 1.0 0.7 0.3 0.7  PROT 4.3* 4.0* 4.0* 5.1*  ALBUMIN 2.0* 1.7* 1.8* 2.2*   No results for input(s): LIPASE, AMYLASE in the last 168 hours. No results for input(s): AMMONIA in the last 168 hours. CBC:  Recent Labs Lab 07/08/16 0545 07/09/16 1220 07/10/16 0608 07/11/16 0405 07/12/16 1332  WBC 10.7* 7.8 7.1 6.8 11.8*  NEUTROABS 9.9*  --  6.7 6.4  --   HGB 10.1* 8.4* 7.8* 7.3* 11.7*  HCT 30.8* 26.1* 24.3* 22.1* 34.9*  MCV 99.7 101.2* 101.3* 99.5 99.1  PLT 62* 51* 41* 54* 73*   Cardiac Enzymes: No results for input(s): CKTOTAL, CKMB, CKMBINDEX, TROPONINI in the last 168 hours. BNP: BNP (last 3 results)  Recent Labs  06/05/16 2354 06/19/16 1210 06/24/16 2151  BNP 45.8 33.6 94.7    ProBNP (last 3 results) No results for input(s): PROBNP in the last 8760 hours.  CBG:  Recent Labs Lab 07/12/16 1705 07/12/16 2035 07/12/16 2330 07/13/16 0339 07/13/16 0743  GLUCAP 146* 170* 144* 123* 122*  SignedOswald Hillock MD.  [facility] 07/13/2016, 12:16 PM

## 2016-07-13 NOTE — Progress Notes (Signed)
ANTICOAGULATION CONSULT NOTE - Initial Consult  Pharmacy Consult for heparin Indication: DVT  Allergies  Allergen Reactions  . Tramadol Palpitations    "Made my chest feel tight"  . Amoxicillin     REACTION: rash  . Penicillins Rash    Has patient had a PCN reaction causing immediate rash, facial/tongue/throat swelling, SOB or lightheadedness with hypotension: unknown Has patient had a PCN reaction causing severe rash involving mucus membranes or skin necrosis: unknown Has patient had a PCN reaction that required hospitalization: no  Has patient had a PCN reaction occurring within the last 10 years: no  If all of the above answers are "NO", then may proceed with Cephalosporin use.     Patient Measurements: Height: '5\' 9"'$  (175.3 cm) IBW/kg (Calculated) : 70.7  Heparin Dosing Weight: 90 kg  Vital Signs: Temp: 97.5 F (36.4 C) (01/03 1500) Temp Source: Oral (01/03 1500) BP: 128/100 (01/03 2048) Pulse Rate: 132 (01/03 2048)  Labs:  Recent Labs  07/11/16 0405 07/12/16 0413 07/12/16 1332 07/13/16 0956  HGB 7.3*  --  11.7*  --   HCT 22.1*  --  34.9*  --   PLT 54*  --  73*  --   CREATININE 0.54* 0.77  --  0.81    Estimated Creatinine Clearance: 87.4 mL/min (by C-G formula based on SCr of 0.81 mg/dL).   Medical History: Past Medical History:  Diagnosis Date  . Adenocarcinoma of right lung, stage 4 ([place]) 05/27/2016  . Adenomatous polyp of colon 1996  . Arthritis    Osteoarthritis right knee, spine, wrist  . Arthritis of hand, right    Right thumb (Dr. Daylene Katayama)  . Back ache    r/o Multiple Myeloma  . Benign prostatic hypertrophy    (Dr. Karsten Ro in past)  . Degenerative disc disease   . Diverticulosis   . DVT (deep venous thrombosis) ([place]) 01/2014   R calf  . Encounter for antineoplastic chemotherapy 10/20/2015  . Erosive esophagitis   . GERD (gastroesophageal reflux disease)   . Hemorrhoids   . Hiatal hernia   . History of chronic prostatitis   . History of  melanoma    Back (Dr. Wilhemina Bonito); early melanoma R arm 03/2011  . Multiple myeloma ([place]) 2013   skull lytic lesions--treated with radiation 07/2014  . Pulmonary embolism ([place]) 01/2014  . Radiation 07/21/14-08/05/14   left occipital parietal skull 25 gray  . SCC (squamous cell carcinoma) 08/04/15   right ear;dysplastic nevus with severe atypia-chest    Medications:  Scheduled:    Assessment: 76 y/o male with NSCLC with mets to brain and MM undergoing chemo. He had recent PE and was on Eliquis PTA then switched to argatroban due to concern for HIT with low platelets. SRA negative on 06/19/16 and has had no exposure to heparin products since then. He was transitioned to Xarelto (instead of Eliquis d/t insurance coverage). Pharmacy consulted to begin IV heparin since patient is NPO and possibly aspirated. He had one dose of Xarelto 20 mg last night at 17:10, prior to that last dose 07/08/16. Will check PTT and heparin levels until they are correlating. Last Hgb 11.7 >> 7.3, platelets 40-70s over the last week.  Goal of Therapy:  Heparin level 0.3-0.5 units/ml due to thrombocytopenia Monitor platelets by anticoagulation protocol: Yes   Plan:  - Begin heparin drip at 1450 units/hr with no bolus - 6 hr heparin level - Daily heparin level, PTT, and CBC - Monitor for s/sx of bleeding  Renold Genta, PharmD, BCPS Clinical Pharmacist Phone for tonight - [place] - [phone] 07/13/2016 9:24 PM

## 2016-07-14 ENCOUNTER — Inpatient Hospital Stay (HOSPITAL_COMMUNITY): Payer: Medicare Other | Admitting: Occupational Therapy

## 2016-07-14 ENCOUNTER — Encounter (HOSPITAL_COMMUNITY): Payer: Self-pay | Admitting: *Deleted

## 2016-07-14 ENCOUNTER — Inpatient Hospital Stay (HOSPITAL_COMMUNITY): Payer: Medicare Other

## 2016-07-14 ENCOUNTER — Inpatient Hospital Stay (HOSPITAL_COMMUNITY): Payer: Medicare Other | Admitting: Physical Therapy

## 2016-07-14 DIAGNOSIS — Z7189 Other specified counseling: Secondary | ICD-10-CM

## 2016-07-14 DIAGNOSIS — Z515 Encounter for palliative care: Secondary | ICD-10-CM

## 2016-07-14 DIAGNOSIS — T17908A Unspecified foreign body in respiratory tract, part unspecified causing other injury, initial encounter: Secondary | ICD-10-CM | POA: Diagnosis present

## 2016-07-14 DIAGNOSIS — L8990 Pressure ulcer of unspecified site, unspecified stage: Secondary | ICD-10-CM | POA: Insufficient documentation

## 2016-07-14 LAB — COMPREHENSIVE METABOLIC PANEL
ALBUMIN: 1.5 g/dL — AB (ref 3.5–5.0)
ALK PHOS: 83 U/L (ref 38–126)
ALT: 168 U/L — ABNORMAL HIGH (ref 17–63)
ANION GAP: 6 (ref 5–15)
AST: 318 U/L — ABNORMAL HIGH (ref 15–41)
BUN: 38 mg/dL — ABNORMAL HIGH (ref 6–20)
CALCIUM: 7.9 mg/dL — AB (ref 8.9–10.3)
CO2: 24 mmol/L (ref 22–32)
Chloride: 114 mmol/L — ABNORMAL HIGH (ref 101–111)
Creatinine, Ser: 1.56 mg/dL — ABNORMAL HIGH (ref 0.61–1.24)
GFR calc non Af Amer: 42 mL/min — ABNORMAL LOW (ref >60)
GFR, EST AFRICAN AMERICAN: 48 mL/min — AB (ref >60)
GLUCOSE: 216 mg/dL — AB (ref 65–99)
POTASSIUM: 4 mmol/L (ref 3.5–5.1)
SODIUM: 144 mmol/L (ref 135–145)
Total Bilirubin: 0.5 mg/dL (ref 0.3–1.2)
Total Protein: 3.8 g/dL — ABNORMAL LOW (ref 6.5–8.1)

## 2016-07-14 LAB — PHOSPHORUS: PHOSPHORUS: 4.4 mg/dL (ref 2.5–4.6)

## 2016-07-14 LAB — CBC
HCT: 25.2 % — ABNORMAL LOW (ref 39.0–52.0)
HEMOGLOBIN: 8.2 g/dL — AB (ref 13.0–17.0)
MCH: 33.1 pg (ref 26.0–34.0)
MCHC: 32.5 g/dL (ref 30.0–36.0)
MCV: 101.6 fL — AB (ref 78.0–100.0)
PLATELETS: 35 10*3/uL — AB (ref 150–400)
RBC: 2.48 MIL/uL — AB (ref 4.22–5.81)
RDW: 19.7 % — ABNORMAL HIGH (ref 11.5–15.5)
WBC: 4.4 10*3/uL (ref 4.0–10.5)

## 2016-07-14 LAB — GLUCOSE, CAPILLARY
GLUCOSE-CAPILLARY: 145 mg/dL — AB (ref 65–99)
GLUCOSE-CAPILLARY: 221 mg/dL — AB (ref 65–99)
Glucose-Capillary: 235 mg/dL — ABNORMAL HIGH (ref 65–99)
Glucose-Capillary: 233 mg/dL — ABNORMAL HIGH (ref 65–99)
Glucose-Capillary: 186 mg/dL — ABNORMAL HIGH (ref 65–99)
Glucose-Capillary: 112 mg/dL — ABNORMAL HIGH (ref 65–99)
Glucose-Capillary: 130 mg/dL — ABNORMAL HIGH (ref 65–99)

## 2016-07-14 LAB — DIFFERENTIAL
BASOS ABS: 0 10*3/uL (ref 0.0–0.1)
Basophils Relative: 1 %
EOS ABS: 0 10*3/uL (ref 0.0–0.7)
Eosinophils Relative: 0 %
LYMPHS ABS: 0.2 10*3/uL — AB (ref 0.7–4.0)
Lymphocytes Relative: 5 %
MONOS PCT: 5 %
Monocytes Absolute: 0.2 10*3/uL (ref 0.1–1.0)
NEUTROS PCT: 89 %
Neutro Abs: 4 10*3/uL (ref 1.7–7.7)

## 2016-07-14 LAB — TRIGLYCERIDES: Triglycerides: 94 mg/dL (ref <150)

## 2016-07-14 LAB — APTT
APTT: 89 s — AB (ref 24–36)
APTT: 112 s — AB (ref 24–36)
aPTT: 99 seconds — ABNORMAL HIGH (ref 24–36)
aPTT: 63 seconds — ABNORMAL HIGH (ref 24–36)
aPTT: 78 seconds — ABNORMAL HIGH (ref 24–36)

## 2016-07-14 LAB — MAGNESIUM: Magnesium: 2.1 mg/dL (ref 1.7–2.4)

## 2016-07-14 LAB — PREALBUMIN: Prealbumin: 6.5 mg/dL — ABNORMAL LOW (ref 18–38)

## 2016-07-14 MED ORDER — CEFTRIAXONE SODIUM 2 G IJ SOLR
2.0000 g | INTRAMUSCULAR | Status: DC
  Administered 2016-07-14 – 2016-07-20 (×7): 2 g via INTRAVENOUS
  Filled 2016-07-14 (×9): qty 2

## 2016-07-14 MED ORDER — SODIUM CHLORIDE 0.9 % IV SOLN
INTRAVENOUS | Status: DC
  Administered 2016-07-14: 10 mL/h via INTRAVENOUS

## 2016-07-14 MED ORDER — ONDANSETRON HCL 4 MG/2ML IJ SOLN
INTRAMUSCULAR | Status: AC
  Administered 2016-07-14: 4 mg
  Filled 2016-07-14: qty 2

## 2016-07-14 MED ORDER — INSULIN ASPART 100 UNIT/ML ~~LOC~~ SOLN
0.0000 [IU] | SUBCUTANEOUS | Status: DC
  Administered 2016-07-14 (×3): 3 [IU] via SUBCUTANEOUS

## 2016-07-14 MED ORDER — TRACE MINERALS CR-CU-MN-SE-ZN 10-1000-500-60 MCG/ML IV SOLN
INTRAVENOUS | Status: AC
  Administered 2016-07-14: 18:00:00 via INTRAVENOUS
  Filled 2016-07-14: qty 1940

## 2016-07-14 MED ORDER — LORAZEPAM 2 MG/ML IJ SOLN
0.5000 mg | Freq: Four times a day (QID) | INTRAMUSCULAR | Status: DC | PRN
  Administered 2016-07-15 – 2016-07-29 (×3): 0.5 mg via INTRAVENOUS
  Filled 2016-07-14 (×3): qty 1

## 2016-07-14 MED ORDER — FAT EMULSION 20 % IV EMUL
234.0000 mL | INTRAVENOUS | Status: AC
  Administered 2016-07-14: 234 mL via INTRAVENOUS
  Filled 2016-07-14: qty 250

## 2016-07-14 MED ORDER — ONDANSETRON HCL 4 MG/2ML IJ SOLN: 4.0000 mg | Freq: Four times a day (QID) | INTRAMUSCULAR | Status: DC

## 2016-07-14 MED ORDER — ONDANSETRON HCL 4 MG/2ML IJ SOLN
4.0000 mg | Freq: Four times a day (QID) | INTRAMUSCULAR | Status: DC | PRN
  Administered 2016-07-14 – 2016-07-28 (×8): 4 mg via INTRAVENOUS
  Filled 2016-07-14 (×7): qty 2

## 2016-07-14 MED ORDER — FENTANYL CITRATE (PF) 100 MCG/2ML IJ SOLN
12.5000 ug | INTRAMUSCULAR | Status: DC | PRN
  Administered 2016-07-15 – 2016-07-18 (×11): 12.5 ug via INTRAVENOUS
  Administered 2016-07-19: 100 ug via INTRAVENOUS
  Administered 2016-07-19 (×5): 50 ug via INTRAVENOUS
  Administered 2016-07-26 – 2016-07-29 (×9): 12.5 ug via INTRAVENOUS
  Filled 2016-07-14 (×20): qty 2

## 2016-07-14 MED ORDER — INSULIN ASPART 100 UNIT/ML ~~LOC~~ SOLN
0.0000 [IU] | SUBCUTANEOUS | Status: DC
  Administered 2016-07-14 (×3): 2 [IU] via SUBCUTANEOUS
  Administered 2016-07-15: 3 [IU] via SUBCUTANEOUS
  Administered 2016-07-15 (×2): 2 [IU] via SUBCUTANEOUS
  Administered 2016-07-16 (×3): 3 [IU] via SUBCUTANEOUS

## 2016-07-14 MED ORDER — ORAL CARE MOUTH RINSE
15.0000 mL | Freq: Two times a day (BID) | OROMUCOSAL | Status: DC
  Administered 2016-07-14 – 2016-07-29 (×29): 15 mL via OROMUCOSAL

## 2016-07-14 MED ORDER — PROCHLORPERAZINE EDISYLATE 5 MG/ML IJ SOLN
5.0000 mg | Freq: Four times a day (QID) | INTRAMUSCULAR | Status: DC | PRN
  Administered 2016-07-14 – 2016-07-19 (×10): 5 mg via INTRAVENOUS
  Filled 2016-07-14 (×7): qty 2
  Filled 2016-07-14: qty 1
  Filled 2016-07-14: qty 2
  Filled 2016-07-14: qty 1
  Filled 2016-07-14: qty 2

## 2016-07-14 MED ORDER — WHITE PETROLATUM GEL
Status: AC
  Administered 2016-07-14
  Filled 2016-07-14: qty 1

## 2016-07-14 MED ORDER — DIATRIZOATE MEGLUMINE & SODIUM 66-10 % PO SOLN
90.0000 mL | Freq: Once | ORAL | Status: AC
  Administered 2016-07-14: 90 mL via NASOGASTRIC
  Filled 2016-07-14: qty 90

## 2016-07-14 NOTE — Progress Notes (Addendum)
ANTICOAGULATION CONSULT NOTE - Follow Up Consult  Pharmacy Consult for Argatroban Indication: Hx of VTE  Allergies  Allergen Reactions  . Tramadol Palpitations    "Made my chest feel tight"  . Amoxicillin     REACTION: rash  . Penicillins Rash    Has patient had a PCN reaction causing immediate rash, facial/tongue/throat swelling, SOB or lightheadedness with hypotension: unknown Has patient had a PCN reaction causing severe rash involving mucus membranes or skin necrosis: unknown Has patient had a PCN reaction that required hospitalization: no  Has patient had a PCN reaction occurring within the last 10 years: no  If all of the above answers are "NO", then may proceed with Cephalosporin use.    Patient Measurements: Height: '5\' 8"'$  (172.7 cm) Weight: 204 lb 2.3 oz (92.6 kg) IBW/kg (Calculated) : 68.4  Vital Signs: Temp: 100.6 F (38.1 C) (01/03 2355) Temp Source: Axillary (01/03 2355) BP: 108/68 (01/04 0200) Pulse Rate: 94 (01/04 0200)  Labs:  Recent Labs  07/11/16 0405 07/12/16 0413 07/12/16 1332 07/13/16 0956 07/14/16 0204  HGB 7.3*  --  11.7*  --   --   HCT 22.1*  --  34.9*  --   --   PLT 54*  --  73*  --   --   APTT  --   --   --   --  89*  CREATININE 0.54* 0.77  --  0.81  --     Estimated Creatinine Clearance: 87 mL/min (by C-G formula based on SCr of 0.81 mg/dL).    Assessment: 76 y/o M with metastatic NSCLC, was on argatroban in past due to concern for HIT, SRA was negative last month, CCM prefers to cont with argatroban for now, started argatroban and previously therapeutic rate of 2 mcg/kg/min, initial aPTT is therapeutic at 89.   Goal of Therapy:  aPTT 50-90  seconds Monitor platelets by anticoagulation protocol: Yes   Plan:  -Cont argatroban 2 mcg/kg/min -Check aPTT at 0500  Narda Bonds 07/14/2016,3:14 AM   ================================ Addendum 6:51 AM APTT up to 112 this AM -Dec argatroban to 1.2 mcg/kg/min -1000 aPTT Narda Bonds,  PharmD, BCPS ================================

## 2016-07-14 NOTE — Discharge Summary (Signed)
Physician Discharge Summary  Patient ID: [name] MRN: [id number] DOB/AGE: 1941/03/18 76 y.o.  Admit date: 07/13/2016 Discharge date: 07/13/2016  Discharge Diagnoses:  Principal Problem:   Acute hypoxemic respiratory failure (HCC) Active Problems:   Debility   Poor tolerance for activity   Acute deep vein thrombosis (DVT) of distal vein of both lower extremities (HCC)   Small bowel obstruction   Hypotension   Acute blood loss anemia   Anemia of chronic disease   Hyperglycemia   Hypoalbuminemia due to protein-calorie malnutrition [facility])   Discharged Condition: guarded  Significant Diagnostic Studies:  Dg Chest Port 1 View  Result Date: 07/01/2016 CLINICAL DATA:  Pneumonia. EXAM: PORTABLE CHEST 1 VIEW COMPARISON:  06/29/2016. FINDINGS: PowerPort catheter noted with lead tip projected over the right atrium. Heart size stable. Bilateral upper lobe and right base infiltrates again noted. Slight progression from prior exam. No prominent pleural effusion or pneumothorax. IMPRESSION: 1. PowerPort catheter in stable position . 2. Bilateral upper lobe and right base infiltrates. Slight progression from prior exam Electronically Signed   By: Marcello Moores  Register   On: 07/01/2016 06:51   Dg Abd 2 Views  Result Date: 07/08/2016 CLINICAL DATA:  Abdominal pain, nausea, vomiting. EXAM: ABDOMEN - 2 VIEW COMPARISON:  None. FINDINGS: Dilated small bowel loops are again noted consistent with distal small bowel obstruction. IVC filter is noted. Phleboliths are noted in the pelvis. No free air is noted suggest pneumoperitoneum. IMPRESSION: Stable small bowel dilatation is noted consistent with distal small bowel obstruction. Electronically Signed   By: Marijo Conception, M.D.   On: 07/08/2016 18:30   Dg Abd Portable 1v  Result Date: 07/13/2016 CLINICAL DATA:  Distended abdomen.  Follow-up. EXAM: PORTABLE ABDOMEN - 1 VIEW COMPARISON:  07/08/2016 FINDINGS: Dilated small bowel loops again noted compatible  small-bowel or obstruction. There appears to be mild progression of small bowel dilatation. Stomach also is dilated. Colon is decompressed. IMPRESSION: Small-bowel obstruction pattern with mild progression since prior study. Stones also is significantly distended. Electronically Signed   By: Franchot Gallo M.D.   On: 07/13/2016 15:37     Labs:  Basic Metabolic Panel:  Recent Labs Lab 07/08/16 0545 07/09/16 1220 07/10/16 7829 07/11/16 0405 07/12/16 0413 07/13/16 0956 07/14/16 0514  NA 145 143 140 139 136 147* 144  K 3.6 3.3* 3.8 3.3* 3.8 3.4* 4.0  CL 107 107 108 107 105 112* 114*  CO2 '30 27 25 26 '$ 18* 27 24  GLUCOSE 127* 101* 133* 153* 229* 152* 216*  BUN 17 22* '20 18 18 '$ 23* 38*  CREATININE 0.81 0.56* 0.56* 0.54* 0.77 0.81 1.56*  CALCIUM 8.7* 7.5* 7.3* 7.5* 7.7* 8.2* 7.9*  MG  --  2.3 2.1 2.1 2.0  --  2.1  PHOS  --  2.7 1.6* 1.5* 2.6 3.1 4.4    CBC:  Recent Labs Lab 07/10/16 0608 07/11/16 0405 07/12/16 1332 07/14/16 0514  WBC 7.1 6.8 11.8* 4.4  NEUTROABS 6.7 6.4  --  4.0  HGB 7.8* 7.3* 11.7* 8.2*  HCT 24.3* 22.1* 34.9* 25.2*  MCV 101.3* 99.5 99.1 101.6*  PLT 41* 54* 73* 35*    CBG:  Recent Labs Lab 07/13/16 1257 07/13/16 1705 07/13/16 2042 07/14/16 0011 07/14/16 0401  GLUCAP 138* 165* 205* 221* 235*    Brief HPI:   [name] is a 76 year old male with history of MM,  stage IVB NSCLC with mets to brain 04/2016 and ongoing chemo, recent PE--on Eliquis who was readmitted to  [facility] 06/24/16 with recurrent   SBO and severe abdominal pain. History taken from chart review, patient, and wife. He was admitted to [facility] on 12/15 and NGT placed for decompression per CCS. Hospital course complicated by projectile vomiting with hypotension due to sepsis form  Bacteriodes vulgatus bacteremia, colitis with GIB--NOAC held and acute BLE DVTs. IVC filter placed and multiple attempts at initiating diet unsuccessful therefore TPN started yesterday.  He was set for surgery due to  ongoing abdominal pain but felt to be high surgical risk due to significant debility. Dr. Marcello Moores recommends getting albumin closer to 3 prior to surgical intervention. Confusion resolving with clearing of mentation per notes. Therapy ongoing and limited by dizziness with orthostatic changes. BUE dopplers done 12/28 due to edema and were negative for DVT. CIR recommended for follow up therapy.     Hospital Course: [name] was admitted to rehab 07/13/2016 for inpatient therapies to consist of PT, ST and OT at least three hours five days a week. Patient was noted to have ongoing issues with N/V at admission and wife reported concerns regarding worsening of symptoms. Stat KUB was done revealing SBO as well as abdominal distension. Patient was agreeable to have NGT placed to help with decompression. He was made NPO except for ice chips. Radiology was consulted to place NGT due to multiple unsuccessful attempts. He developed aspiration event due to emesis a few hours later with resultant hypoxia and tachypnea. He was placed on NRB mask with some improvement but continued to be distressed and was transferred to ICU for closer monitoring.     Disposition: Discharged to ICU.   Diet: NPO  Medications at discharge:  1. Xarelto 20 mg daily. 2. Protonix 40 mg IV bid 3. Decadron 4 mg IV bid 4. Morphine 4 mg IV every 4 hours prn sever pain. 5. DDAVP 0.1 mg po HS. 4. TPN per pharmacy. 5. Restoril 15 mg q hs prn 6.  Zofran 8 mg IV every 6 hours prn nausea 7. Compazine 5-10 mg every 6 hrs prn nausea not relieved by Zofran.  8. SSI every 4 hours  Signed: Bary Leriche 07/14/2016, 8:49 AM

## 2016-07-14 NOTE — Progress Notes (Signed)
ANTICOAGULATION CONSULT NOTE - Follow Up Consult  Pharmacy Consult for Argatroban Indication: Hx of VTE  Allergies  Allergen Reactions  . Tramadol Palpitations    "Made my chest feel tight"  . Amoxicillin     REACTION: rash  . Penicillins Rash    Has patient had a PCN reaction causing immediate rash, facial/tongue/throat swelling, SOB or lightheadedness with hypotension: unknown Has patient had a PCN reaction causing severe rash involving mucus membranes or skin necrosis: unknown Has patient had a PCN reaction that required hospitalization: no  Has patient had a PCN reaction occurring within the last 10 years: no  If all of the above answers are "NO", then may proceed with Cephalosporin use.    Patient Measurements: Height: '5\' 8"'$  (172.7 cm) Weight: 204 lb 2.3 oz (92.6 kg) IBW/kg (Calculated) : 68.4  Vital Signs: Temp: 97.6 F (36.4 C) (01/04 1704) Temp Source: Oral (01/04 1704) BP: 90/64 (01/04 1600) Pulse Rate: 96 (01/04 1600)  Labs:  Recent Labs  07/12/16 0413 07/12/16 1332 07/13/16 0956  07/14/16 0514 07/14/16 1042 07/14/16 1520 07/14/16 1829  HGB  --  11.7*  --   --  8.2*  --   --   --   HCT  --  34.9*  --   --  25.2*  --   --   --   PLT  --  73*  --   --  35*  --   --   --   APTT  --   --   --   < > 112* 99* 63* 78*  CREATININE 0.77  --  0.81  --  1.56*  --   --   --   < > = values in this interval not displayed.  Estimated Creatinine Clearance: 45.2 mL/min (by C-G formula based on SCr of 1.56 mg/dL (H)).   Assessment: 76 y/o M with metastatic NSCLC, was on argatroban in past due to concern for HIT, SRA was negative last month, CCM prefers to cont with argatroban for now.   aPTT therapeutic = 78 seconds  Goal of Therapy:  aPTT 50-90  seconds Monitor platelets by anticoagulation protocol: Yes   Plan:  -Continue argatroban at 1 mcg/kg/min -Daily aPTT/CBC  Georga Bora, PharmD Clinical Pharmacist Pager: [phone] 07/14/2016 8:17 PM

## 2016-07-14 NOTE — Consult Note (Signed)
[facility] Surgery Consult/Admission Note  [name] Apr 28, 1941  [id number].    Requesting MD: Dr. Elsworth Soho Chief Complaint/Reason for Consult: SBO  HPI:   76 year old male remote smoker with history of multiple myeloma, BPH, DDD, diverticulosis, DVT, erosive gastritis, GERD, appendectomy, bilateral inguinal hernia repair, PE on Xarelto, and recent diagnosis of stage IV adenocarcinoma with metastases to the brain, on [name] was admitted to the hospital in 10/17 with SBO that resolved with conservative therapy. He was admitted 06/24/16 for SOB, gram negative rod bacteremia, Bacteroides, and SBO. At the time, due to the other health issues the pt was facing, pt was a poor surgical candiate. Conservative therapy was again initiated and pt was placed on TNA. As of 07/12/16, the pt was having bowel function and it was thought his SBO was resolving. Pt was transferred to CIR from [facility].  Yesterday the pt began to develop nausea, vomiting and abdominal pain. Pain was located in the lower abdomin, non radiating, described as "extreme pressure" with associated nausea and vomiting. Pt states he had a black watery bowel movement 3 days ago. Pt has also been complaining of right leg pain that he describes as constant and burning that began to today. Pain is located on the lateral aspect of his leg, non-radiating, 7/10. NGT output is dark brown/black in color.  CIR course yesterday: KUB revealed SBO as well as abdominal distension. NGT placed via radiology, NPO except for ice chips. Pt developed aspiration event due to emesis a few hours later with resultant hypoxia and tachypnea. He was placed on NRB mask with some improvement but continued to be distressed and was transferred to ICU for closer monitoring.   Of note pt on Xarelto for pulmonary embolism, but was found to have bilateral lower extremity DVT in 12/21 when he was off Xarelto, IVC filter was placed. Last dose was 07/12/16 @  1710.   ROS:  Review of Systems  Constitutional: Negative for chills and fever.  HENT: Negative for sore throat.   Respiratory: Negative for cough and wheezing.   Cardiovascular: Negative for chest pain.  Gastrointestinal: Positive for abdominal pain, diarrhea, melena, nausea and vomiting.  Skin: Negative for itching.  Neurological: Positive for weakness. Negative for dizziness and loss of consciousness.  All other systems reviewed and are negative.    Family History  Problem Relation Age of Onset  . Dementia Mother   . Stroke Father     brainstem stroke  . Hypertension Father   . Diabetes Father   . Prostate cancer Maternal Grandfather   . Depression Daughter   . Anesthesia problems Neg Hx     Past Medical History:  Diagnosis Date  . Adenocarcinoma of right lung, stage 4 ([place]) 05/27/2016  . Adenomatous polyp of colon 1996  . Arthritis    Osteoarthritis right knee, spine, wrist  . Arthritis of hand, right    Right thumb (Dr. Daylene Katayama)  . Back ache    r/o Multiple Myeloma  . Benign prostatic hypertrophy    (Dr. Karsten Ro in past)  . Degenerative disc disease   . Diverticulosis   . DVT (deep venous thrombosis) ([place]) 01/2014   R calf  . Encounter for antineoplastic chemotherapy 10/20/2015  . Erosive esophagitis   . GERD (gastroesophageal reflux disease)   . Hemorrhoids   . Hiatal hernia   . History of chronic prostatitis   . History of melanoma    Back (Dr. Wilhemina Bonito); early melanoma R arm 03/2011  .  Multiple myeloma ([place]) 2013   skull lytic lesions--treated with radiation 07/2014  . Pulmonary embolism ([place]) 01/2014  . Radiation 07/21/14-08/05/14   left occipital parietal skull 25 gray  . SCC (squamous cell carcinoma) 08/04/15   right ear;dysplastic nevus with severe atypia-chest    Past Surgical History:  Procedure Laterality Date  . APPENDECTOMY    . BONE MARROW TRANSPLANT  03/2013   Stem Cell  . COLONOSCOPY  2009   adenomatous polyp  .  ESOPHAGOGASTRODUODENOSCOPY  2009   mild inflammation at esophagogastric junction  . excision of melanoma     back ( x3)  . Great toe surgery     bilateral  . INGUINAL HERNIA REPAIR     left, x 2  . IR GENERIC HISTORICAL  06/30/2016   IR IVC FILTER PLMT / S&I Burke Keels GUID/MOD SED 06/30/2016 Jacqulynn Cadet, MD [facility]-INTERV RAD  . port placemet  10/2012  . SCALENE NODE BIOPSY Right 05/11/2016   Procedure: BIOPSY RIGHT SCALENE NODE;  Surgeon: Grace Isaac, MD;  Location: [place];  Service: Thoracic;  Laterality: Right;  . TOTAL KNEE ARTHROPLASTY  06/08/2011   Procedure: TOTAL KNEE ARTHROPLASTY;  Surgeon: Ninetta Lights, MD;  Location: [place];  Service: Orthopedics;  Laterality: Right;  osteonics  . total knee replaced  2000   Left knee    Social History:  reports that he quit smoking about 40 years ago. His smoking use included Cigarettes. He has a 30.00 pack-year smoking history. He has never used smokeless tobacco. He reports that he drinks about 1.5 oz of alcohol per week . He reports that he does not use drugs.  Allergies:  Allergies  Allergen Reactions  . Tramadol Palpitations    "Made my chest feel tight"  . Amoxicillin     REACTION: rash  . Penicillins Rash    Has patient had a PCN reaction causing immediate rash, facial/tongue/throat swelling, SOB or lightheadedness with hypotension: unknown Has patient had a PCN reaction causing severe rash involving mucus membranes or skin necrosis: unknown Has patient had a PCN reaction that required hospitalization: no  Has patient had a PCN reaction occurring within the last 10 years: no  If all of the above answers are "NO", then may proceed with Cephalosporin use.     Medications Prior to Admission  Medication Sig Dispense Refill  . acetaminophen (TYLENOL) 325 MG tablet Take 2 tablets (650 mg total) by mouth every 6 (six) hours as needed for mild pain (or Fever >/= 101).    Marland Kitchen acetaminophen (TYLENOL) 500 MG tablet Take 500 mg by mouth  every 6 (six) hours as needed for moderate pain or headache.    Marland Kitchen ascorbic acid (VITAMIN C) 500 MG tablet Take 500 mg by mouth 2 (two) times daily.     . cholecalciferol (VITAMIN D) 1000 UNITS tablet Take 1,000 Units by mouth at bedtime.     Marland Kitchen desmopressin (DDAVP) 0.1 MG tablet Take 1 tablet (0.1 mg total) by mouth at bedtime. 30 tablet 0  . dexamethasone (DECADRON) 4 MG tablet Take 1 tablet (4 mg total) by mouth 2 (two) times daily. (Patient taking differently: Take 2 mg by mouth 2 (two) times daily. )    . fish oil-omega-3 fatty acids 1000 MG capsule Take 1 g by mouth at bedtime.     . furosemide (LASIX) 20 MG tablet Take 1 tablet (20 mg total) by mouth daily. (Patient taking differently: Take 20 mg by mouth daily as needed for fluid. )  30 tablet 0  . insulin aspart (NOVOLOG) 100 UNIT/ML injection Inject 0-15 Units into the skin every 4 (four) hours. 10 mL 11  . LORazepam (ATIVAN) 0.5 MG tablet Take 1 tab PO Q 6 hours PRN nausea or anxiety (Patient taking differently: Take 0.5 mg by mouth every 6 (six) hours as needed for anxiety (nausea). ) 30 tablet 0  . methocarbamol (ROBAXIN) 500 MG tablet TAKE 1 TO 2 TABLETS BY MOUTH EVERY 6 HOURS AS NEEDED FOR MUSCLE SPASMS 60 tablet 0  . Multiple Vitamin (MULTIVITAMIN WITH MINERALS) TABS Take 1 tablet by mouth at bedtime.     . ondansetron (ZOFRAN) 8 MG tablet Take 1 tablet (8 mg total) by mouth every 8 (eight) hours as needed for nausea or vomiting. 30 tablet 1  . pantoprazole (PROTONIX) 40 MG tablet TAKE 1 TABLET BY MOUTH TWO  TIMES DAILY 180 tablet 2  . Probiotic Product (ALIGN) 4 MG CAPS Take 1 capsule by mouth daily. Reported on 09/10/2015    . prochlorperazine (COMPAZINE) 10 MG tablet Take 1 tablet (10 mg total) by mouth every 6 (six) hours as needed. (Patient taking differently: Take 10 mg by mouth every 6 (six) hours as needed for nausea or vomiting. ) 30 tablet 1  . prochlorperazine (COMPAZINE) 5 MG/ML injection Inject 1 mL (5 mg total) into the vein  every 6 (six) hours as needed. 2 mL   . rivaroxaban (XARELTO) 20 MG TABS tablet Take 1 tablet (20 mg total) by mouth daily with supper. 30 tablet   . tamsulosin (FLOMAX) 0.4 MG CAPS capsule Take 2 capsules (0.8 mg total) by mouth daily after supper. (Patient taking differently: Take 0.8 mg by mouth 2 (two) times daily. ) 60 capsule 11  . temazepam (RESTORIL) 15 MG capsule Take 1 capsule (15 mg total) by mouth at bedtime as needed. (Patient taking differently: Take 15 mg by mouth at bedtime as needed for sleep. ) 60 capsule 0  . zolendronic acid (ZOMETA) 4 MG/5ML injection Inject 4 mg into the vein every 3 (three) months.       Blood pressure (!) 92/56, pulse 92, temperature 97.5 F (36.4 C), temperature source Oral, resp. rate (!) 21, height 5\' 8"  (1.727 m), weight 204 lb 2.3 oz (92.6 kg), SpO2 93 %.  Physical Exam: General: pleasant, thin, ill appearing elderly man who is laying in bed in NAD HEENT: head is normocephalic, atraumatic.  Sclera are noninjected, no scleral icterus, lids normal, EOM grossly normal. Oral mucosa is pink and moist Heart: regular, rate, and rhythm.  No obvious murmurs, gallops, or rubs noted.  2+ radial pulses bilaterally Lungs: CTAB anteriorly.  Respiratory effort nonlabored Abd: soft, NT/ND, hypoactive BS, no masses, hernias, or organomegaly appreciated MS: good ROM of all extremities, 3+ pitting edema noted to BLE, small area of echymosis noted to lateral aspect of mid right thigh, TTP to posterior, lateral and anterior thigh. Full ROM of RLE without pain. Sensation intact of BLE.  Skin: warm and dry Psych: A&Ox3 with an appropriate affect. Neuro: CM grossly 2-12 intact, normal speech  Results for orders placed or performed during the hospital encounter of 07/13/16 (from the past 48 hour(s))  Glucose, capillary     Status: Abnormal   Collection Time: 07/14/16 12:11 AM  Result Value Ref Range   Glucose-Capillary 221 (H) 65 - 99 mg/dL  APTT     Status: Abnormal    Collection Time: 07/14/16  2:04 AM  Result Value Ref Range   aPTT  89 (H) 24 - 36 seconds    Comment:        IF BASELINE aPTT IS ELEVATED, SUGGEST PATIENT RISK ASSESSMENT BE USED TO DETERMINE APPROPRIATE ANTICOAGULANT THERAPY.   Glucose, capillary     Status: Abnormal   Collection Time: 07/14/16  4:01 AM  Result Value Ref Range   Glucose-Capillary 235 (H) 65 - 99 mg/dL   Comment 1 Document in Chart   CBC     Status: Abnormal   Collection Time: 07/14/16  5:14 AM  Result Value Ref Range   WBC 4.4 4.0 - 10.5 K/uL   RBC 2.48 (L) 4.22 - 5.81 MIL/uL   Hemoglobin 8.2 (L) 13.0 - 17.0 g/dL   HCT 25.2 (L) 39.0 - 52.0 %   MCV 101.6 (H) 78.0 - 100.0 fL   MCH 33.1 26.0 - 34.0 pg   MCHC 32.5 30.0 - 36.0 g/dL   RDW 19.7 (H) 11.5 - 15.5 %   Platelets 35 (L) 150 - 400 K/uL    Comment: PLATELET COUNT CONFIRMED BY SMEAR  Prealbumin     Status: Abnormal   Collection Time: 07/14/16  5:14 AM  Result Value Ref Range   Prealbumin 6.5 (L) 18 - 38 mg/dL  Differential     Status: Abnormal   Collection Time: 07/14/16  5:14 AM  Result Value Ref Range   Neutrophils Relative % 89 %   Lymphocytes Relative 5 %   Monocytes Relative 5 %   Eosinophils Relative 0 %   Basophils Relative 1 %   Neutro Abs 4.0 1.7 - 7.7 K/uL   Lymphs Abs 0.2 (L) 0.7 - 4.0 K/uL   Monocytes Absolute 0.2 0.1 - 1.0 K/uL   Eosinophils Absolute 0.0 0.0 - 0.7 K/uL   Basophils Absolute 0.0 0.0 - 0.1 K/uL   RBC Morphology POLYCHROMASIA PRESENT     Comment: RARE NRBCs   WBC Morphology MILD LEFT SHIFT (1-5% METAS, OCC MYELO, OCC BANDS)     Comment: INCREASED BANDS (>20% BANDS) TOXIC GRANULATION DOHLE BODIES VACUOLATED NEUTROPHILS   Comprehensive metabolic panel     Status: Abnormal   Collection Time: 07/14/16  5:14 AM  Result Value Ref Range   Sodium 144 135 - 145 mmol/L   Potassium 4.0 3.5 - 5.1 mmol/L   Chloride 114 (H) 101 - 111 mmol/L   CO2 24 22 - 32 mmol/L   Glucose, Bld 216 (H) 65 - 99 mg/dL   BUN 38 (H) 6 - 20 mg/dL    Creatinine, Ser 1.56 (H) 0.61 - 1.24 mg/dL   Calcium 7.9 (L) 8.9 - 10.3 mg/dL   Total Protein 3.8 (L) 6.5 - 8.1 g/dL   Albumin 1.5 (L) 3.5 - 5.0 g/dL   AST 318 (H) 15 - 41 U/L   ALT 168 (H) 17 - 63 U/L   Alkaline Phosphatase 83 38 - 126 U/L   Total Bilirubin 0.5 0.3 - 1.2 mg/dL   GFR calc non Af Amer 42 (L) >60 mL/min   GFR calc Af Amer 48 (L) >60 mL/min    Comment: (NOTE) The eGFR has been calculated using the CKD EPI equation. This calculation has not been validated in all clinical situations. eGFR's persistently <60 mL/min signify possible Chronic Kidney Disease.    Anion gap 6 5 - 15  Magnesium     Status: None   Collection Time: 07/14/16  5:14 AM  Result Value Ref Range   Magnesium 2.1 1.7 - 2.4 mg/dL  Phosphorus  Status: None   Collection Time: 07/14/16  5:14 AM  Result Value Ref Range   Phosphorus 4.4 2.5 - 4.6 mg/dL  APTT     Status: Abnormal   Collection Time: 07/14/16  5:14 AM  Result Value Ref Range   aPTT 112 (H) 24 - 36 seconds    Comment:        IF BASELINE aPTT IS ELEVATED, SUGGEST PATIENT RISK ASSESSMENT BE USED TO DETERMINE APPROPRIATE ANTICOAGULANT THERAPY. REPEATED TO VERIFY   Triglycerides     Status: None   Collection Time: 07/14/16  5:14 AM  Result Value Ref Range   Triglycerides 94 <150 mg/dL  Glucose, capillary     Status: Abnormal   Collection Time: 07/14/16  8:56 AM  Result Value Ref Range   Glucose-Capillary 233 (H) 65 - 99 mg/dL   Comment 1 Notify RN    Comment 2 Document in Chart   APTT     Status: Abnormal   Collection Time: 07/14/16 10:42 AM  Result Value Ref Range   aPTT 99 (H) 24 - 36 seconds    Comment:        IF BASELINE aPTT IS ELEVATED, SUGGEST PATIENT RISK ASSESSMENT BE USED TO DETERMINE APPROPRIATE ANTICOAGULANT THERAPY.   Glucose, capillary     Status: Abnormal   Collection Time: 07/14/16 12:15 PM  Result Value Ref Range   Glucose-Capillary 186 (H) 65 - 99 mg/dL   *Note: Due to a large number of results and/or  encounters for the requested time period, some results have not been displayed. A complete set of results can be found in Results Review.   Dg Chest Port 1 View  Result Date: 07/13/2016 CLINICAL DATA:  Possible aspiration 45 minutes ago. Respiratory distress. EXAM: PORTABLE CHEST 1 VIEW COMPARISON:  Single-view of the chest 07/07/2016. FINDINGS: Lung volumes are low. Small focus of discoid atelectasis is seen in the left mid lung zone. The right lung is clear. Small amount of fluid or thickening of the minor fissure noted. No pneumothorax or pleural effusion. Heart size is upper normal. Port-A-Cath is in place. IMPRESSION: No acute disease. Airspace opacity right in the lung base seen on the comparison exam has resolved. Electronically Signed   By: Drusilla Kanner M.D.   On: 07/13/2016 19:29   Dg Abd Portable 1v  Result Date: 07/13/2016 CLINICAL DATA:  Distended abdomen.  Follow-up. EXAM: PORTABLE ABDOMEN - 1 VIEW COMPARISON:  07/08/2016 FINDINGS: Dilated small bowel loops again noted compatible small-bowel or obstruction. There appears to be mild progression of small bowel dilatation. Stomach also is dilated. Colon is decompressed. IMPRESSION: Small-bowel obstruction pattern with mild progression since prior study. Stones also is significantly distended. Electronically Signed   By: Marlan Palau M.D.   On: 07/13/2016 15:37      Assessment/Plan SBO: recurrent in the setting of stage IV lung cancer, bilateral DVTs, Hx of PE on Xarelto - concern for carcinomatosis vs adhesions with hx of only one abd surgery, appendecotmy  - surgery vs conservative therapy. Pt has improved from his sepsis but is still not in the best condition for surgery. Will need to discuss with Dr. Donell Beers.  Continue to hold Xarelto (last dose 07/12/16 @ 1710) pending possible surgery, NPO, NG tube.   Right thigh pain: unsure of etiology and acute.  - pending femur xray  Thank you for the consult. We will continue to follow the  pt.   Jerre Simon, [facility] Surgery 07/14/2016, 12:28 PM Pager: [phone]  Consults: [phone] Mon-Fri 7:00 am-4:30 pm Sat-Sun 7:00 am-11:30 am

## 2016-07-14 NOTE — Progress Notes (Signed)
Initial Nutrition Assessment  DOCUMENTATION CODES:   Severe malnutrition in context of chronic illness  INTERVENTION:   TPN per Pharmacy   NUTRITION DIAGNOSIS:   Malnutrition (Severe) related to chronic illness as evidenced by energy intake < or equal to 75% for > or equal to 1 month, severe depletion of muscle mass, severe fluid accumulation.  GOAL:   Patient will meet greater than or equal to 90% of their needs  MONITOR:   I & O's, Labs, Skin  REASON FOR ASSESSMENT:   Consult New TPN/TNA  ASSESSMENT:   Pt with PMH of MM and stage IV NSCLC now with mets to brain discovered 04/2016 undergoing XRT to brain lesions and Nat Math, PE on Xarelto. He has been admitted several times in the past few months, initially for SBO back in October, which resolved with conservative therapy. He was deemed a poor surgical candidate, but it was felt that if he could regain some strength he could undergo surgery. He was admitted again 12/15 with projectile vomiting and SOB. He was then found to have GNR bacteremia. Course became complicated by shock and DVT while he was off DOAC for colitis related GIB. IVC filter placed. He was started on TPN 1/2 and transferred to CIR 1/3. Later that day he developed vomiting again where and was suspected to have aspirated developing hypoxia and significant respiratory distress.    Pt reports very poor intake > 1 month. Last took PO he thinks in early December.  Usual weight 210 lb, current weight reflects severe edema.  Palliative care consulted.  TPN: Clinimix 5/15 1940 ml over 18 hours, lipids @ 13 ml x 18 hours. Provides: 2001 kcal (93% of minimum needs) and 97 grams protein (83% of minimum needs).  Labs reviewed: prealbumin 6.5 Nutrition-Focused physical exam completed. Findings are mild/moderate fat depletion, mild/moderate muscle depletion, and severe edema.   Diet Order:  TPN (CLINIMIX) Adult without lytes  Skin:  Wound (see comment) (Non-pressure R  foot wound, stage II sacrum)  Last BM:  1/3  Height:   Ht Readings from Last 1 Encounters:  07/13/16 '5\' 8"'$  (1.727 m)    Weight:   Wt Readings from Last 1 Encounters:  07/13/16 204 lb 2.3 oz (92.6 kg)    Ideal Body Weight:  70 kg  BMI:  Body mass index is 31.04 kg/m.  Estimated Nutritional Needs:   Kcal:  2150-2350  Protein:  115-125  Fluid:  > 2 L/day  EDUCATION NEEDS:   No education needs identified at this time  [place], [place], [place] Pager [phone] After Hours Pager

## 2016-07-14 NOTE — Consult Note (Signed)
Consultation Note Date: 07/14/2016   Patient Name: [name]  DOB: [birth year]  MRN: [id number]  Age / Sex: 76 y.o., male  PCP: Rita Ohara, MD Referring Physician: Rigoberto Noel, MD  Reason for Consultation: Establishing goals of care  HPI/Patient Profile: 76 y.o. male  with past medical history of multiple myeloma (s/p chemotherapy and autologous transplant maintenance with Revlimid on hold), NSCLC (mets to brain s/p stereotactic surgery and Keytruda x 1) admitted on 07/13/2016 with respiratory failure and SBO. He was discharged on 1/3 to rehab after a prolonged stay due to PE, sepsis due to Bacteriodes vulgatus bacteremia, and colitis with GI bleed.    Clinical Assessment and Goals of Care:  Patient has been in the hospital since 11/26 with a brief period of days at home 12/5-12/10. His 11/26-12/5 admission was also due to SBO which resolved with conservative therapy. He was readmitted on 27/06 with PE  complicated by bacteremia and sepsis during admission. He was discharged on 1/3 to rehab but was the readmitted on the same day with vomiting, respiratory failure and SBO. He has been on TPN and NPO for quite sometime. His nutritional status is pretty depleted with albumin of 1.5 and prealbumin of 6.5 indicating little reserve and high risk of mortality. Surgery has been consulted and notes that if patient's nutritional status can improve then he will be good candidate for surgery. Per surgery there is slight concern for abdominal carcinamotosis, due to patient's lack of abdominal surgical history and thus low risk of adhesions that could be causing SBO, however, no evidence of carcinamatosis at this point. Met with patient's wife and daughter. Patient was too drowsy to participate in meeting, however, he did acknowledge that he would want his wife making health care decisions for him. He is DNR.  Discussed disease  trajectory of cancer and his recent declines in functional status. Discussed future goals and options of surgery for SBO or possible venting PEG, and restarting chemotherapy. Discussed option of comfort care and Hospice at home.  Patient has an at home care benefit they could utilize along side of Hospice services. Their main goals right now are to continue current level of care, treat what is treatable, and hope to resume Keytruda. [name] is aware that patient may not recover full functional status, but notes he would be able to maintain good quality of life with at home care.   Primary Decision Maker NEXT OF KIN - spouse- Juancarlos Crescenzo  SUMMARY OF RECOMMENDATIONS -DNR -Continue current effort to improve nutritional status in hopes of surgery for SBO, then rehab with goal of resuming Keytruda -PMT will monitor status and continue to assist in evaluating [place] based on progress    Code Status/Advance Care Planning:  DNR   Palliative Prophylaxis:   Aspiration, Delirium Protocol and Frequent Pain Assessment  Additional Recommendations (Limitations, Scope, Preferences):  Full Scope Treatment  Psycho-social/Spiritual:   Desire for further Chaplaincy support:No  Prognosis:    < 6 months due to very poor nutritional status with  albumin 1.5, prealbumin 6.5; poor functional status, metastatic NSCLC with treatment on hold due to poor functional status  Discharge Planning: To Be Determined  Primary Diagnoses: Present on Admission: . Aspiration into airway   I have reviewed the medical record, interviewed the patient and family, and examined the patient. The following aspects are pertinent.  Past Medical History:  Diagnosis Date  . Adenocarcinoma of right lung, stage 4 ([place]) 05/27/2016  . Adenomatous polyp of colon 1996  . Arthritis    Osteoarthritis right knee, spine, wrist  . Arthritis of hand, right    Right thumb (Dr. Daylene Katayama)  . Back ache    r/o Multiple Myeloma  . Benign prostatic  hypertrophy    (Dr. Karsten Ro in past)  . Degenerative disc disease   . Diverticulosis   . DVT (deep venous thrombosis) ([place]) 01/2014   R calf  . Encounter for antineoplastic chemotherapy 10/20/2015  . Erosive esophagitis   . GERD (gastroesophageal reflux disease)   . Hemorrhoids   . Hiatal hernia   . History of chronic prostatitis   . History of melanoma    Back (Dr. Wilhemina Bonito); early melanoma R arm 03/2011  . Multiple myeloma ([place]) 2013   skull lytic lesions--treated with radiation 07/2014  . Pulmonary embolism ([place]) 01/2014  . Radiation 07/21/14-08/05/14   left occipital parietal skull 25 gray  . SCC (squamous cell carcinoma) 08/04/15   right ear;dysplastic nevus with severe atypia-chest   Social History   Social History  . Marital status: Married    Spouse name: N/A  . Number of children: 2  . Years of education: N/A   Occupational History  . 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 Smoking status: Former Smoker    Packs/day: 3.00    Years: 10.00    Types: Cigarettes    Quit date: 07/11/1976  . Smokeless tobacco: Never Used     Comment: 3 PPD x 6 years  . Alcohol use 1.5 oz/week    3 Standard drinks or equivalent per week     Comment: 2 glasses of wine daily  . Drug use: No  . Sexual activity: Yes    Partners: Female   Other Topics Concern  . None   Social History Narrative   Married.  2 daughters, both in [place], 4 granddaughters   Family History  Problem Relation Age of Onset  . Dementia Mother   . Stroke Father     brainstem stroke  . Hypertension Father   . Diabetes Father   . Prostate cancer Maternal Grandfather   . Depression Daughter   . Anesthesia problems Neg Hx    Scheduled Meds: . cefTRIAXone (ROCEPHIN)  IV  2 g Intravenous Q24H  . desmopressin  10 mcg Nasal QHS  . dexamethasone  4 mg Intravenous Q12H  . diatrizoate meglumine-sodium  90 mL Per NG tube Once  . insulin aspart  0-15 Units  Subcutaneous Q4H  . mouth rinse  15 mL Mouth Rinse BID  . metronidazole  500 mg Intravenous Q8H   Continuous Infusions: . sodium chloride 10 mL/hr (07/14/16 1500)  . argatroban 1 mcg/kg/min (07/14/16 1446)  . TPN (CLINIMIX) Adult without lytes     And  . fat emulsion     PRN Meds:.fentaNYL (SUBLIMAZE) injection, LORazepam, ondansetron (ZOFRAN) IV, prochlorperazine Medications Prior to Admission:  Prior to Admission medications   Medication Sig Start Date End Date Taking? Authorizing Provider  acetaminophen (TYLENOL) 325  MG tablet Take 2 tablets (650 mg total) by mouth every 6 (six) hours as needed for mild pain (or Fever >/= 101). 07/13/16  Yes Oswald Hillock, MD  ascorbic acid (VITAMIN C) 500 MG tablet Take 500 mg by mouth 2 (two) times daily.    Yes Historical Provider, MD  cholecalciferol (VITAMIN D) 1000 UNITS tablet Take 1,000 Units by mouth at bedtime.    Yes Historical Provider, MD  desmopressin (DDAVP) 0.1 MG tablet Take 1 tablet (0.1 mg total) by mouth at bedtime. 04/25/16  Yes Rita Ohara, MD  dexamethasone (DECADRON) 4 MG tablet Take 1 tablet (4 mg total) by mouth 2 (two) times daily. Patient taking differently: Take 2 mg by mouth 2 (two) times daily.  06/13/16  Yes Barton Dubois, MD  fish oil-omega-3 fatty acids 1000 MG capsule Take 1 g by mouth at bedtime.    Yes Historical Provider, MD  furosemide (LASIX) 20 MG tablet Take 1 tablet (20 mg total) by mouth daily. Patient taking differently: Take 20 mg by mouth daily as needed for fluid.  02/29/16  Yes Rita Ohara, MD  LORazepam (ATIVAN) 0.5 MG tablet Take 1 tab PO Q 6 hours PRN nausea or anxiety Patient taking differently: Take 0.5 mg by mouth every 6 (six) hours as needed for anxiety (nausea).  06/24/16  Yes Curt Bears, MD  methocarbamol (ROBAXIN) 500 MG tablet TAKE 1 TO 2 TABLETS BY MOUTH EVERY 6 HOURS AS NEEDED FOR MUSCLE SPASMS 12/09/15  Yes Rita Ohara, MD  Multiple Vitamin (MULTIVITAMIN WITH MINERALS) TABS Take 1 tablet by mouth  at bedtime.    Yes Historical Provider, MD  ondansetron (ZOFRAN) 8 MG tablet Take 1 tablet (8 mg total) by mouth every 8 (eight) hours as needed for nausea or vomiting. 05/18/16  Yes Susanne Borders, NP  oxyCODONE (OXY IR/ROXICODONE) 5 MG immediate release tablet Take 5-10 mg by mouth daily. 05/18/16  Yes Historical Provider, MD  pantoprazole (PROTONIX) 40 MG tablet TAKE 1 TABLET BY MOUTH TWO  TIMES DAILY 05/21/16  Yes Curt Bears, MD  Probiotic Product (ALIGN) 4 MG CAPS Take 1 capsule by mouth daily. Reported on 09/10/2015   Yes Historical Provider, MD  prochlorperazine (COMPAZINE) 10 MG tablet Take 1 tablet (10 mg total) by mouth every 6 (six) hours as needed. Patient taking differently: Take 10 mg by mouth every 6 (six) hours as needed for nausea or vomiting.  04/29/16  Yes Curt Bears, MD  prochlorperazine (COMPAZINE) 5 MG/ML injection Inject 1 mL (5 mg total) into the vein every 6 (six) hours as needed. 07/13/16  Yes Oswald Hillock, MD  rivaroxaban (XARELTO) 20 MG TABS tablet Take 1 tablet (20 mg total) by mouth daily with supper. 07/13/16  Yes Oswald Hillock, MD  tamsulosin (FLOMAX) 0.4 MG CAPS capsule Take 2 capsules (0.8 mg total) by mouth daily after supper. Patient taking differently: Take 0.8 mg by mouth 2 (two) times daily.  06/13/16  Yes Kathie Rhodes, MD  temazepam (RESTORIL) 15 MG capsule Take 1 capsule (15 mg total) by mouth at bedtime as needed. Patient taking differently: Take 15 mg by mouth at bedtime as needed for sleep.  04/08/16  Yes Curt Bears, MD  zolendronic acid (ZOMETA) 4 MG/5ML injection Inject 4 mg into the vein every 3 (three) months.    Yes Historical Provider, MD  insulin aspart (NOVOLOG) 100 UNIT/ML injection Inject 0-15 Units into the skin every 4 (four) hours. Patient not taking: Reported on 07/14/2016 07/13/16  Oswald Hillock, MD   Allergies  Allergen Reactions  . Tramadol Palpitations    "Made my chest feel tight"  . Amoxicillin     REACTION: rash  . Penicillins  Rash    Has patient had a PCN reaction causing immediate rash, facial/tongue/throat swelling, SOB or lightheadedness with hypotension: unknown Has patient had a PCN reaction causing severe rash involving mucus membranes or skin necrosis: unknown Has patient had a PCN reaction that required hospitalization: no  Has patient had a PCN reaction occurring within the last 10 years: no  If all of the above answers are "NO", then may proceed with Cephalosporin use.    Review of Systems  Unable to perform ROS   Physical Exam  Constitutional:  Cachexic  Cardiovascular:  tachycardic  Pulmonary/Chest: Effort normal and breath sounds normal.  Abdominal:  NG tube in place  Musculoskeletal:  Generalized weakness  Neurological:  Drowsy  Psychiatric:  Drifts off during conversation  Nursing note and vitals reviewed.   Vital Signs: BP (!) 89/60   Pulse 98   Temp 97.5 F (36.4 C) (Oral)   Resp (!) 30   Ht '5\' 8"'$  (1.727 m)   Wt 92.6 kg (204 lb 2.3 oz)   SpO2 99%   BMI 31.04 kg/m  Pain Assessment: No/denies pain   Pain Score: 0-No pain   SpO2: SpO2: 99 % O2 Device:SpO2: 99 % O2 Flow Rate: .O2 Flow Rate (L/min): 6 L/min  IO: Intake/output summary:  Intake/Output Summary (Last 24 hours) at 07/14/16 1637 Last data filed at 07/14/16 1500  Gross per 24 hour  Intake          2410.34 ml  Output             2100 ml  Net           310.34 ml    LBM:   Baseline Weight: Weight: 92.6 kg (204 lb 2.3 oz) Most recent weight: Weight: 92.6 kg (204 lb 2.3 oz)     Palliative Assessment/Data: PPS: 10%     Thank you for this consult. Palliative medicine will continue to follow and assist as needed.   Time In: 1530 Time Out: 1715 Time Total:110 minutes Greater than 50%  of this time was spent counseling and coordinating care related to the above assessment and plan.  Signed by: Mariana Kaufman, AGNP-C Palliative Medicine    Please contact Palliative Medicine Team phone at [phone] for  questions and concerns.  For individual provider: See Shea Evans

## 2016-07-14 NOTE — Progress Notes (Signed)
Received patient from in patient rehab on distress, patient alert and responsive, breathing very labored and and tachypneic and also tachycardic. E-link notified Dr. Titus Mould  camera in the patient, will send critical care on call to assessed patient.

## 2016-07-14 NOTE — Progress Notes (Signed)
Gastrografin via NG and clamped. Radiology notified.

## 2016-07-14 NOTE — Progress Notes (Signed)
ANTICOAGULATION CONSULT NOTE - Follow Up Consult  Pharmacy Consult for Argatroban Indication: Hx of VTE  Allergies  Allergen Reactions  . Tramadol Palpitations    "Made my chest feel tight"  . Amoxicillin     REACTION: rash  . Penicillins Rash    Has patient had a PCN reaction causing immediate rash, facial/tongue/throat swelling, SOB or lightheadedness with hypotension: unknown Has patient had a PCN reaction causing severe rash involving mucus membranes or skin necrosis: unknown Has patient had a PCN reaction that required hospitalization: no  Has patient had a PCN reaction occurring within the last 10 years: no  If all of the above answers are "NO", then may proceed with Cephalosporin use.    Patient Measurements: Height: '5\' 8"'$  (172.7 cm) Weight: 204 lb 2.3 oz (92.6 kg) IBW/kg (Calculated) : 68.4  Vital Signs: Temp: 97.5 F (36.4 C) (01/04 1216) Temp Source: Oral (01/04 1216) BP: 89/60 (01/04 1200) Pulse Rate: 98 (01/04 1200)  Labs:  Recent Labs  07/12/16 0413 07/12/16 1332 07/13/16 0956  07/14/16 0514 07/14/16 1042 07/14/16 1520  HGB  --  11.7*  --   --  8.2*  --   --   HCT  --  34.9*  --   --  25.2*  --   --   PLT  --  73*  --   --  35*  --   --   APTT  --   --   --   < > 112* 99* 63*  CREATININE 0.77  --  0.81  --  1.56*  --   --   < > = values in this interval not displayed.  Estimated Creatinine Clearance: 45.2 mL/min (by C-G formula based on SCr of 1.56 mg/dL (H)).   Assessment: 76 y/o M with metastatic NSCLC, was on argatroban in past due to concern for HIT, SRA was negative last month, CCM prefers to cont with argatroban for now.   aPTT therapeutic = 63 seconds  Goal of Therapy:  aPTT 50-90  seconds Monitor platelets by anticoagulation protocol: Yes   Plan:  -Continue argatroban at 1 mcg/kg/min -Check aPTT at 1800  Georga Bora, PharmD Clinical Pharmacist Pager: [phone] 07/14/2016 4:54 PM

## 2016-07-14 NOTE — Progress Notes (Addendum)
PHARMACY - ADULT TOTAL PARENTERAL NUTRITION CONSULT NOTE   Pharmacy Consult:  TPN Indication:  SBO  Patient Measurements: Height: _0  (172.7 cm) Weight: 204 lb 2.3 oz (92.6 kg) IBW/kg (Calculated) : 68.4 TPN AdjBW (KG): 74.5 Body mass index is 31.04 kg/m.   Assessment:  38 YOM with recent admission on 11/26-12/4/17 for SBO, re-admitted to [facility] on 06/24/16 with PE and recurrent SBO. Patient was unable to tolerate fluids and had continued nausea and vomiting.  NG tube placement failed multiple times and Pharmacy was consulted to manage TPN for nutritional support.  Patient transferred to [facility] on 07/14/15.  He has moderate malnutrition.  Per documentation, his intake was </= 75% for 1 month PTA.  Surgery is delayed because patient was too debilitated.    GI: GERD.  Prealbumin 10.7 > 6.5 Endo: no hx DM - CBGs elevated (on Decadron 61m BID).  DDAVP for diabetes insipidus. Insulin requirements in the past 24 hours: inaccurate d/t transfer Lytes: all WNL except mildly elevated CL Renal: BPH.  AKI - SCr 0.81 > 1.56, BUN 38, inaccurate I/O's d/t transfer Pulm: RA >> NRM, FiO2 100% Cards: BP low, tachy - not on med AC: Xarelto PTA for hx PE/DVT > transitioned to argatroban and then Eliquis, bleeding on AC so received IVC filter, then resumed Xarelto and now on argatroban again - hgb 8.2, plts down to 35 Onc: MM / SCC / metastatic lung cancer on immunotherapy with Ketruda (on hold) Hepatobil: AST/ALT elevated, alk phos and tbili WNL.  TG 184 > 94. Neuro: DJD - A&O ID: CTX/Flagyl for colitis - Tmax 100.6, WBC WNL Best Practices: argatroban TPN Access: right CVC port placed 10/30/12 TPN start date: 07/09/16  Nutritional Goals: 2150-2350 kCal and 115-125 gm protein per day  Current Nutrition:  TPN   Plan:  - Continue cyclic Clinimix, change to 5/15 without electrolytes given AKI, infuse 1940 mls over 18 hours: 50 ml/hr x 1 hr, then 115 ml/hr x 16 hrs, then 50 ml/hr x 1 hr - Lipids at  13 ml/hr x 18 hours (total 234 ml/day) - TPN will provide 2001 kCal and 97 gm of protein per day, meeting 93% of kCal and 87% of protein needs. - Daily multivitamin and trace elements in TPN - Change SSI to moderate and continue Q4H.  Add 10 units regular insulin to TPN. - F/U CBGs and shorten cyclic cycle when controlled. - F/U AM labs   Ahuva Poynor D. DMina Marble PharmD, BCPS Pager:  [phone]/10/2016, 9:31 AM

## 2016-07-14 NOTE — Progress Notes (Signed)
[facility] Progress Note Patient Name: [name] DOB: Feb 02, 1941 MRN: [id number]   Date of Service  07/14/2016  HPI/Events of Note  Blood glucose = 221.  eICU Interventions  Will order: 1. Q 4 hours sensitive Novolog SSI.     Intervention Category Intermediate Interventions: Hyperglycemia - evaluation and treatment  Sommer,Steven Eugene 07/14/2016, 1:04 AM

## 2016-07-14 NOTE — IPOC Note (Signed)
IPOC can't be completed due to patient's medical transfer/brief stay  Meredith Staggers, MD, [place] [facility] 07/14/2016

## 2016-07-14 NOTE — Progress Notes (Signed)
Patient information reviewed and entered into eRehab system by Miriam Liles, RN, CRRN, PPS Coordinator.  Information including medical coding and functional independence measure will be reviewed and updated through discharge.     Per nursing patient was given "Data Collection Information Summary for Patients in Inpatient Rehabilitation Facilities with attached "Privacy Act Statement-Health Care Records" upon admission.  

## 2016-07-14 NOTE — Progress Notes (Signed)
tx to 3S 14

## 2016-07-14 NOTE — Progress Notes (Signed)
ANTICOAGULATION CONSULT NOTE - Follow Up Consult  Pharmacy Consult for Argatroban Indication: Hx of VTE  Allergies  Allergen Reactions  . Tramadol Palpitations    "Made my chest feel tight"  . Amoxicillin     REACTION: rash  . Penicillins Rash    Has patient had a PCN reaction causing immediate rash, facial/tongue/throat swelling, SOB or lightheadedness with hypotension: unknown Has patient had a PCN reaction causing severe rash involving mucus membranes or skin necrosis: unknown Has patient had a PCN reaction that required hospitalization: no  Has patient had a PCN reaction occurring within the last 10 years: no  If all of the above answers are "NO", then may proceed with Cephalosporin use.    Patient Measurements: Height: '5\' 8"'$  (172.7 cm) Weight: 204 lb 2.3 oz (92.6 kg) IBW/kg (Calculated) : 68.4  Vital Signs: Temp: 97.2 F (36.2 C) (01/04 0800) Temp Source: Oral (01/04 0800) BP: 92/56 (01/04 1000) Pulse Rate: 92 (01/04 1100)  Labs:  Recent Labs  07/12/16 0413 07/12/16 1332 07/13/16 0956 07/14/16 0204 07/14/16 0514 07/14/16 1042  HGB  --  11.7*  --   --  8.2*  --   HCT  --  34.9*  --   --  25.2*  --   PLT  --  73*  --   --  35*  --   APTT  --   --   --  89* 112* 99*  CREATININE 0.77  --  0.81  --  1.56*  --     Estimated Creatinine Clearance: 45.2 mL/min (by C-G formula based on SCr of 1.56 mg/dL (H)).    Assessment: 76 y/o M with metastatic NSCLC, was on argatroban in past due to concern for HIT, SRA was negative last month, CCM prefers to cont with argatroban for now. aPTT supratherapeutic at 99 sec  Goal of Therapy:  aPTT 50-90  seconds Monitor platelets by anticoagulation protocol: Yes   Plan:  -Decrease argatroban to 1 mcg/kg/min -Check aPTT at [place], PharmD PGY1 Pharmacy Resident Pager: [phone] 07/14/2016 12:02 PM

## 2016-07-14 NOTE — Progress Notes (Signed)
PULMONARY / CRITICAL CARE MEDICINE   Name: [name] MRN: [id number] DOB: June 27, 1941    ADMISSION DATE:  07/13/2016 CONSULTATION DATE:  1/3  REFERRING MD:  Inpatient Rehab  HISTORY OF PRESENT ILLNESS:   76 year old man with history significant for MM and stage IV NSCLC now with mets to brain discovered 04/2016 undergoing radiotherapy to brain lesions and Ketruda, pulmonary embolism on Xarelto. He has been admitted several times in the past few months, initially for SBO back in October for SBO, which resolved with conservative therapy. He was deemed a poor surgical candidate, but it was felt that if he could regain some strength he could undergo surgery. He was admitted again 12/15 with projectile vomiting and SOB. He was then found to have GNR bacteremia. Course became complicated by shock and DVT while he was off DOAC for colitis related GIB. IVC filter placed. He was started on TPN 1/2 and transferred to CIR 1/3. Later that day he developed vomiting again where and was suspected to have aspirated developing hypoxia and significant respiratory distress.    SUBJECTIVE:  Feels better overall. Denies nausea/vomiting, abdominal pain. No flatus or BM. Denies fevers or chills.  VITAL SIGNS: BP (!) 88/65   Pulse 95   Temp (P) 97.9 F (36.6 C) (Oral)   Resp (!) 37   Ht '5\' 8"'$  (1.727 m)   Wt 204 lb 2.3 oz (92.6 kg)   SpO2 100%   BMI 31.04 kg/m   INTAKE / OUTPUT: I/O last 3 completed shifts: In: 5027 [I.V.:1254; IV Piggyback:150] Out: 1300 [Urine:650; Emesis/NG output:650]  PHYSICAL EXAMINATION: General: NAD Neuro: A&O x 3, non-focal.  HEENT: [place]/AT. PERRL, sclerae anicteric. Cardiovascular: RRR Lungs: Scattered crackles throughout Abdomen: Abd soft and nontender, mild distention Musculoskeletal: No gross deformities, 3+ BLE edema Skin: Intact, warm.  LABS:  BMET  Recent Labs Lab 07/12/16 0413 07/13/16 0956 07/14/16 0514  NA 136 147* 144  K 3.8 3.4* 4.0  CL 105 112*  114*  CO2 18* 27 24  BUN 18 23* 38*  CREATININE 0.77 0.81 1.56*  GLUCOSE 229* 152* 216*    Electrolytes  Recent Labs Lab 07/11/16 0405 07/12/16 0413 07/13/16 0956 07/14/16 0514  CALCIUM 7.5* 7.7* 8.2* 7.9*  MG 2.1 2.0  --  2.1  PHOS 1.5* 2.6 3.1 4.4    CBC  Recent Labs Lab 07/11/16 0405 07/12/16 1332 07/14/16 0514  WBC 6.8 11.8* 4.4  HGB 7.3* 11.7* 8.2*  HCT 22.1* 34.9* 25.2*  PLT 54* 73* PENDING    Coag's  Recent Labs Lab 07/14/16 0204 07/14/16 0514  APTT 89* 112*    Liver Enzymes  Recent Labs Lab 07/11/16 0405 07/12/16 0413 07/14/16 0514  AST 16 45* 318*  ALT 14* 27 168*  ALKPHOS 64 97 83  BILITOT 0.3 0.7 0.5  ALBUMIN 1.8* 2.2* 1.5*   Glucose  Recent Labs Lab 07/13/16 0743 07/13/16 1257 07/13/16 1705 07/13/16 2042 07/14/16 0011 07/14/16 0401  GLUCAP 122* 138* 165* 205* 221* 235*    Imaging Dg Chest Port 1 View  Result Date: 07/13/2016 CLINICAL DATA:  Possible aspiration 45 minutes ago. Respiratory distress. EXAM: PORTABLE CHEST 1 VIEW COMPARISON:  Single-view of the chest 07/07/2016. FINDINGS: Lung volumes are low. Small focus of discoid atelectasis is seen in the left mid lung zone. The right lung is clear. Small amount of fluid or thickening of the minor fissure noted. No pneumothorax or pleural effusion. Heart size is upper normal. Port-A-Cath is in place. IMPRESSION: No acute disease.  Airspace opacity right in the lung base seen on the comparison exam has resolved. Electronically Signed   By: Inge Rise M.D.   On: 07/13/2016 19:29   Dg Abd Portable 1v  Result Date: 07/13/2016 CLINICAL DATA:  Distended abdomen.  Follow-up. EXAM: PORTABLE ABDOMEN - 1 VIEW COMPARISON:  07/08/2016 FINDINGS: Dilated small bowel loops again noted compatible small-bowel or obstruction. There appears to be mild progression of small bowel dilatation. Stomach also is dilated. Colon is decompressed. IMPRESSION: Small-bowel obstruction pattern with mild  progression since prior study. Stones also is significantly distended. Electronically Signed   By: Franchot Gallo M.D.   On: 07/13/2016 15:37     STUDIES:  11/2 MRI Head > Mets to Brain  12/20 CT abd/pelvis > Persistently dilated fluid and gas filled small bowel loops to level of the right lower quadrant/ distal ilium with abrupt transition Venous US 12/21>Findings consistent with acute deep vein thrombosis involvingperoneal, popliteal, and distal femoral artery of the right lowerextremity 12/27 ABD Xray > Moderately dilated small bowel loops concerning for distal small bowel obstruction 12/28 > BUE venous duplex > Neg DVT   CULTURES: BCX2 12/15: bacteroides vulgatus BCX2 12/20>>>negative  ANTIBIOTICS: Merrem 12/20>>> 12/27 Flagyl 12/21>> 12/26  SIGNIFICANT EVENTS: 12/15 > Admit Dyspnea and ABD pain > GNR bacteremia  12/20 > Progressive hypotension > CT ABD ischemia vs infectious colitis  12/21 > Stop oral anticoagulation > GIB r/t colitis > Temp Filter placed  12/29 > To [facility]  1/3 > D/C to Rehab > Readmit for Aspiration event   LINES/TUBES: Right port   DISCUSSION: 76 year old man with history significant for MM and stage IV NSCLC now with mets to brain, pulmonary embolism on Xarelto, recent admissions for SBO, GNR bacteremia complicated by shock and DVT with subsequent IVC filter presenting with emesis suspected to have aspirated developing hypoxia and significant respiratory distress.   ASSESSMENT / PLAN:  PULMONARY A: Acute Hypoxic Respiratory Failure secondary to possible aspiration event  H/O PE on Eliquis, Stage IV Lung Cancer  P:   Supplemental oxygen to maintain saturation >92 BiPAP prn Abx / culture per ID section  CARDIOVASCULAR A:  Chronic dCHF P:  Continue to monitor  RENAL A:   Hyponatremia >> Resolved Hypokalemia >> Resolved H/O BPH, Chronic prostatitis   P:   Cont DDAVP  GASTROINTESTINAL A:   Recurrent Distal Small bowel obstruction   Malnutrition  Dysphagia  H/O GERD  P:   NPO NGT to low intermittent wall suction Nutrition Following  Continue TPN  PPI  PRN Zofran and Compazine  Consult surgery  HEMATOLOGIC A:   NSCLC (80% PDL1+)-->treating w/ Keytruda > Dr. [name]  H/o Multiple Myeloma w/ skull lesion s/p XRT 2016 H/o PE and DVT on Eliquis-->new DVTs on NOAC 12/21 > S/P temp filter 12/1 H/O HIT  P:  Trend CBC Argatroban per pharm Consult onc  INFECTIOUS A:   Aspiration pneumonitis versus pneumonia H/O Bacteroides Vulgatus bacteremia (beta lactamase +) >suspect transient bacterial translocation  P:   Ceftriaxone 1/3>> Flagyl 1/3>> Obtain Sputum Culture   ENDOCRINE A:   DM  H/O DI  P:   Cont DDAVP, SSI, Decadron  NEURO -Fent IV 12.95mg q4hr pain -May consider methylnaltrexone. Will defer to palliative.  Transfer to SDU. [facility] to resume care 1/5  JJacques Earthly MD  Internal Medicine PGY-3 07/14/2016 8:26 AM

## 2016-07-14 NOTE — Progress Notes (Signed)
No charge note.  Palliative medicine consult received.   Family meeting planned for 330pm today.   Mariana Kaufman, AGNP-C Palliative Medicine  Please call Palliative Medicine team phone with any questions [phone]. For individual providers please see AMION.

## 2016-07-15 ENCOUNTER — Inpatient Hospital Stay (HOSPITAL_COMMUNITY): Payer: Medicare Other

## 2016-07-15 ENCOUNTER — Telehealth: Payer: Self-pay | Admitting: Medical Oncology

## 2016-07-15 DIAGNOSIS — Z7189 Other specified counseling: Secondary | ICD-10-CM

## 2016-07-15 DIAGNOSIS — Z515 Encounter for palliative care: Secondary | ICD-10-CM

## 2016-07-15 LAB — COMPREHENSIVE METABOLIC PANEL
ALK PHOS: 74 U/L (ref 38–126)
ALT: 151 U/L — AB (ref 17–63)
AST: 138 U/L — ABNORMAL HIGH (ref 15–41)
Albumin: 1.4 g/dL — ABNORMAL LOW (ref 3.5–5.0)
Anion gap: 7 (ref 5–15)
BUN: 37 mg/dL — ABNORMAL HIGH (ref 6–20)
CALCIUM: 7.7 mg/dL — AB (ref 8.9–10.3)
CHLORIDE: 114 mmol/L — AB (ref 101–111)
CO2: 26 mmol/L (ref 22–32)
CREATININE: 1.08 mg/dL (ref 0.61–1.24)
Glucose, Bld: 150 mg/dL — ABNORMAL HIGH (ref 65–99)
Potassium: 3.4 mmol/L — ABNORMAL LOW (ref 3.5–5.1)
Sodium: 147 mmol/L — ABNORMAL HIGH (ref 135–145)
Total Bilirubin: 0.2 mg/dL — ABNORMAL LOW (ref 0.3–1.2)
Total Protein: 3.8 g/dL — ABNORMAL LOW (ref 6.5–8.1)

## 2016-07-15 LAB — PHOSPHORUS: PHOSPHORUS: 2.7 mg/dL (ref 2.5–4.6)

## 2016-07-15 LAB — GLUCOSE, CAPILLARY
GLUCOSE-CAPILLARY: 149 mg/dL — AB (ref 65–99)
GLUCOSE-CAPILLARY: 95 mg/dL (ref 65–99)
GLUCOSE-CAPILLARY: 115 mg/dL — AB (ref 65–99)
GLUCOSE-CAPILLARY: 171 mg/dL — AB (ref 65–99)
Glucose-Capillary: 143 mg/dL — ABNORMAL HIGH (ref 65–99)

## 2016-07-15 LAB — CBC
HCT: 21.4 % — ABNORMAL LOW (ref 39.0–52.0)
Hemoglobin: 7 g/dL — ABNORMAL LOW (ref 13.0–17.0)
MCH: 32.3 pg (ref 26.0–34.0)
MCHC: 32.7 g/dL (ref 30.0–36.0)
MCV: 98.6 fL (ref 78.0–100.0)
PLATELETS: 37 10*3/uL — AB (ref 150–400)
RBC: 2.17 MIL/uL — AB (ref 4.22–5.81)
RDW: 19.7 % — ABNORMAL HIGH (ref 11.5–15.5)
WBC: 6.7 10*3/uL (ref 4.0–10.5)

## 2016-07-15 LAB — MAGNESIUM: MAGNESIUM: 2.1 mg/dL (ref 1.7–2.4)

## 2016-07-15 LAB — APTT: aPTT: 80 seconds — ABNORMAL HIGH (ref 24–36)

## 2016-07-15 MED ORDER — TRACE MINERALS CR-CU-MN-SE-ZN 10-1000-500-60 MCG/ML IV SOLN
INTRAVENOUS | Status: AC
  Administered 2016-07-15: 18:00:00 via INTRAVENOUS
  Filled 2016-07-15: qty 1990

## 2016-07-15 MED ORDER — SODIUM CHLORIDE 0.9 % IV SOLN
30.0000 meq | Freq: Once | INTRAVENOUS | Status: AC
  Administered 2016-07-15: 30 meq via INTRAVENOUS
  Filled 2016-07-15: qty 15

## 2016-07-15 MED ORDER — FAT EMULSION 20 % IV EMUL
240.0000 mL | INTRAVENOUS | Status: AC
  Administered 2016-07-15: 240 mL via INTRAVENOUS
  Filled 2016-07-15 (×2): qty 250

## 2016-07-15 MED ORDER — DEXTROSE 5 % IV SOLN
15.0000 mmol | Freq: Once | INTRAVENOUS | Status: AC
  Administered 2016-07-15: 15 mmol via INTRAVENOUS
  Filled 2016-07-15: qty 5

## 2016-07-15 NOTE — Progress Notes (Signed)
PULMONARY / CRITICAL CARE MEDICINE   Name: [name] MRN: [id number] DOB: Jan 09, 1941    ADMISSION DATE:  07/13/2016 CONSULTATION DATE:  1/3  REFERRING MD:  Inpatient Rehab  HISTORY OF PRESENT ILLNESS:   76 year old man with history significant for MM and stage IV NSCLC now with mets to brain discovered 04/2016 undergoing radiotherapy to brain lesions and Ketruda, pulmonary embolism on Xarelto. He has been admitted several times in the past few months, initially for SBO back in October for SBO, which resolved with conservative therapy. He was deemed a poor surgical candidate, but it was felt that if he could regain some strength he could undergo surgery. He was admitted again 12/15 with projectile vomiting and SOB. He was then found to have GNR bacteremia. Course became complicated by shock and DVT while he was off DOAC for colitis related GIB. IVC filter placed. He was started on TPN 1/2 and transferred to CIR 1/3. Later that day he developed vomiting again where and was suspected to have aspirated developing hypoxia and significant respiratory distress.    SUBJECTIVE:  Feels better overall. Wants to get up  VITAL SIGNS: BP 105/68 (BP Location: Left Arm)   Pulse 90   Temp 97.9 F (36.6 C) (Oral)   Resp (!) 21   Ht _0  (1.727 m)   Wt 204 lb 2.3 oz (92.6 kg)   SpO2 95%   BMI 31.04 kg/m   INTAKE / OUTPUT: I/O last 3 completed shifts: In: 4176.7 [I.V.:3556.7; NG/GT:270; IV Piggyback:350] Out: 4800 [Urine:2550; Emesis/NG output:2250]  PHYSICAL EXAMINATION: General: NAD Neuro: A&O x 3, non-focal. Interactive HEENT: [place]/AT. PERRL, sclerae anicteric. Cardiovascular: RRR Lungs: decreased ba thru out Abdomen: Abd soft and nontender, mild distention Musculoskeletal: No gross deformities, 3+ BLE edema Skin: Intact, warm. RN noted 2x2 m stage 2 sacral ulcer.   LABS:  BMET  Recent Labs Lab 07/13/16 0956 07/14/16 0514 07/15/16 0339  NA 147* 144 147*  K 3.4* 4.0 3.4*  CL  112* 114* 114*  CO2 _1 BUN 23* 38* 37*  CREATININE 0.81 1.56* 1.08  GLUCOSE 152* 216* 150*    Electrolytes  Recent Labs Lab 07/12/16 0413 07/13/16 0956 07/14/16 0514 07/15/16 0339  CALCIUM 7.7* 8.2* 7.9* 7.7*  MG 2.0  --  2.1 2.1  PHOS 2.6 3.1 4.4 2.7    CBC  Recent Labs Lab 07/12/16 1332 07/14/16 0514 07/15/16 0339  WBC 11.8* 4.4 6.7  HGB 11.7* 8.2* 7.0*  HCT 34.9* 25.2* 21.4*  PLT 73* 35* 37*    Coag's  Recent Labs Lab 07/14/16 1520 07/14/16 1829 07/15/16 0339  APTT 63* 78* 80*    Liver Enzymes  Recent Labs Lab 07/12/16 0413 07/14/16 0514 07/15/16 0339  AST 45* 318* 138*  ALT 27 168* 151*  ALKPHOS 97 83 74  BILITOT 0.7 0.5 0.2*  ALBUMIN 2.2* 1.5* 1.4*   Glucose  Recent Labs Lab 07/14/16 1215 07/14/16 1702 07/14/16 1958 07/14/16 2330 07/15/16 0316 07/15/16 0736  GLUCAP 186* 112* 130* 145* 143* 149*    Imaging Dg Abd Portable 1v-small Bowel Obstruction Protocol-initial, 8 Hr Delay  Result Date: 07/15/2016 CLINICAL DATA:  Small bowel obstruction, follow-up evaluation. EXAM: PORTABLE ABDOMEN - 1 VIEW COMPARISON:  Abdominal radiograph September 10, 2016 FINDINGS: Multiple loops of gas-filled small bowel measure up to 3.1 cm, decreased from prior examination. Small amount of large bowel gas. No intra- abdominal mass effect or pathologic calcifications. Inferior vena cava filter projects of the RIGHT at L2-3.  Nasogastric tube looped in proximal stomach. Mild vascular calcifications. Degenerative change of the lumbar spine. IMPRESSION: Resolving small bowel obstruction, nasogastric tube in proximal stomach. Electronically Signed   By: Elon Alas M.D.   On: 07/15/2016 03:20   Dg Femur Port, Min 2 Views Right  Result Date: 07/14/2016 CLINICAL DATA:  Pain distal femur. History of multiple myeloma and lung carcinoma EXAM: RIGHT FEMUR PORTABLE 2 VIEW COMPARISON:  None. FINDINGS: Portions of the proximal femur are not visualized. Frontal and  lateral views of the mid and distal portions of the femur obtained. There is a total knee replacement with prosthetic components well-seated. No fracture or dislocation. No knee joint effusion. In regions which are visualized, there is no abnormal periosteal reaction. No blastic or lytic bone lesions. IMPRESSION: In visualized portions of the right femur, there is no blastic or lytic bone lesion. No abnormal periosteal reaction. No fracture or dislocation. No knee joint effusion. Total knee replacement prosthetic components appear well seated. Electronically Signed   By: Lowella Grip III M.D.   On: 07/14/2016 16:03     STUDIES:  11/2 MRI Head > Mets to Brain  12/20 CT abd/pelvis > Persistently dilated fluid and gas filled small bowel loops to level of the right lower quadrant/ distal ilium with abrupt transition Venous US 12/21>Findings consistent with acute deep vein thrombosis involvingperoneal, popliteal, and distal femoral artery of the right lowerextremity 12/27 ABD Xray > Moderately dilated small bowel loops concerning for distal small bowel obstruction 12/28 > BUE venous duplex > Neg DVT   CULTURES: BCX2 12/15: bacteroides vulgatus BCX2 12/20>>>negative  ANTIBIOTICS: Merrem 12/20>>> 12/27 Flagyl 12/21>> 12/26  1/4 Roc>> 1/4 Flagyl>>  SIGNIFICANT EVENTS: 12/15 > Admit Dyspnea and ABD pain > GNR bacteremia  12/20 > Progressive hypotension > CT ABD ischemia vs infectious colitis  12/21 > Stop oral anticoagulation > GIB r/t colitis > Temp Filter placed  12/29 > To [facility]  1/3 > D/C to Rehab > Readmit for Aspiration event  1/5 transfer tot [facility] service 1/6  LINES/TUBES: Right port   DISCUSSION: 76 year old man with history significant for MM and stage IV NSCLC now with mets to brain, pulmonary embolism on Xarelto, recent admissions for SBO, GNR bacteremia complicated by shock and DVT with subsequent IVC filter presenting with emesis suspected to have aspirated developing  hypoxia and significant respiratory distress. 1/5 more alert and NAD.  ASSESSMENT / PLAN:  PULMONARY A: Acute Hypoxic Respiratory Failure secondary to possible aspiration event  H/O PE on Eliquis, Stage IV Lung Cancer  P:   Supplemental oxygen to maintain saturation >92 BiPAP prn Abx / culture per ID section  CARDIOVASCULAR A:  Chronic dCHF P:  Continue to monitor  RENAL A:   Hyponatremia >> Resolved Hypokalemia >> Resolved H/O BPH, Chronic prostatitis   P:   Cont DDAVP  GASTROINTESTINAL A:   Recurrent Distal Small bowel obstruction  Malnutrition  Dysphagia  H/O GERD  P:   NPO NGT to low intermittent wall suction Nutrition Following  Continue TPN  PPI  PRN Zofran and Compazine  Consulted surgery see there note  HEMATOLOGIC A:   NSCLC (80% PDL1+)-->treating w/ Keytruda > Dr. [name]  H/o Multiple Myeloma w/ skull lesion s/p XRT 2016 H/o PE and DVT on Eliquis-->new DVTs on NOAC 12/21 > S/P temp filter 12/1 H/O HIT  P:  Trend CBC Argatroban per pharm Consult onc  INFECTIOUS A:   Aspiration pneumonitis versus pneumonia H/O Bacteroides Vulgatus bacteremia (beta lactamase +) >suspect transient  bacterial translocation  Stage 2 sacral ulcer noted 2x2 cms P:   Ceftriaxone 1/3>> Flagyl 1/3>> Obtain Sputum Culture   ENDOCRINE CBG (last 3)   Recent Labs  07/14/16 2330 07/15/16 0316 07/15/16 0736  GLUCAP 145* 143* 149*     A:   DM  H/O DI  P:   Cont DDAVP, SSI, Decadron  NEURO -Fent IV 12.24mg q4hr pain -May consider methylnaltrexone. Will defer to palliative.    SRichardson LandryMinor ACNP LMaryanna ShapePCCM Pager [phone] 3 pm If no answer page [phone]/11/2016, 9:26 AM

## 2016-07-15 NOTE — Progress Notes (Signed)
Discussed with Dr. Denton Brick ([facility]) via phone. [facility] to resume care on 1/6.

## 2016-07-15 NOTE — Care Management Note (Signed)
Case Management Note  Patient Details  Name: [name] MRN: [id number] Date of Birth: June 05, 1941  Subjective/Objective:   Presents with ARF and SBO with NG tube to suction, sats decreased was on NRB, stage 4 lung cancer with mets to brain.  On 6 liters today, on cyclic tpn. On argatrelon drip plts are 30,000, CSW aware for backup SNF.                 Action/Plan:   Expected Discharge Date:                  Expected Discharge Plan:  IP Rehab Facility  In-House Referral:  Clinical Social Work  Discharge planning Services     Post Acute Care Choice:    Choice offered to:     DME Arranged:    DME Agency:     HH Arranged:    [place] Agency:     Status of Service:  In process, will continue to follow  If discussed at Long Length of Stay Meetings, dates discussed:    Additional Comments:  Cordaro, Mukai, RN 07/15/2016, 5:19 PM

## 2016-07-15 NOTE — Progress Notes (Signed)
PHARMACY - ADULT TOTAL PARENTERAL NUTRITION CONSULT NOTE   Pharmacy Consult:  TPN Indication:  SBO  Patient Measurements: Height: _0  (172.7 cm) Weight: 204 lb 2.3 oz (92.6 kg) IBW/kg (Calculated) : 68.4 TPN AdjBW (KG): 74.5 Body mass index is 31.04 kg/m.   Assessment:  71 YOM with recent admission on 11/26-12/4/17 for SBO, re-admitted to [facility] on 06/24/16 with PE and recurrent SBO. Patient was unable to tolerate fluids and had continued nausea and vomiting.  NG tube placement failed multiple times and Pharmacy was consulted to manage TPN for nutritional support.  Patient transferred to [facility] on 07[facility]14[facility]15.  Per documentation, his intake was </= 75% for 1 month PTA.  Now with severe malnutrition.  Surgery is delayed because patient was too debilitated.    GI: GERD.  Prealbumin 10.7 > 6.5.  Imaging showing resolving SBO.  NG O/P 1681m Endo: no hx DM - CBGs improving, 112 off TPN and 130-150 while on TPN (on Decadron 4108mBID).  DDAVP for diabetes insipidus. Insulin requirements in the past 24 hours: 10 units in TPN + 15 units SSI Lytes: K+ 3.4, Na/CL elevated, others WNL *day without lytes: 1/4 Renal: BPH.  AKI resolving - SCr down 1.08, BUN down 37 - good UOP 0.9 ml/kg/hr Pulm: reduced on 2L  Cards: BP low-low normal - not on med AC: Xarelto PTA for hx PE/DVT > transitioned to argatroban and then Eliquis, bleeding on AC so received IVC filter, then resumed Xarelto and now on argatroban again - hgb down 7, plts 37 Onc: MM / SCC / metastatic lung cancer on immunotherapy with Ketruda (on hold) Hepatobil: AST/ALT improving, alk phos and tbili WNL.  TG 184 > 94. Neuro: DJD - A&O ID: CTX/Flagyl for colitis - now afebrile, WBC WNL Best Practices: argatroban TPN Access: right CVC port placed 10/30/12 TPN start date: 07/09/16  Nutritional Goals: 2150-2350 kCal and 115-125 gm protein per day  Current Nutrition:  TPN   Plan:  - Continue cyclic Clinimix 5/1/85no electrolytes d/t  elevated Na/CL) infuse 1990 mls over 16 hours: 50 ml/hr x 1 hr, then 135 ml/hr x 14 hrs, then 50 ml/hr x 1 hr - Lipids at 15 ml/hr x 16 hours (total 240 ml/day) - TPN will provide 1953 kCal and 100 gm of protein per day, meeting 91% of kCal and 87% of protein needs.  Capping TPN at this rate d/t critical Clinimix shortage. - Daily multivitamin and trace elements in TPN - Continue moderate SSI Q4H and 10 units of regular insulin in TPN - KCL 3062mIV x 1 - KPhos 10 mmol IV x 1 - F/U tolerance to cycle TPN further - F/U AM labs   Juliene Kirsh D. DanMina MarbleharmD, BCPS Pager:  [phone]/2018, 8:47 AM

## 2016-07-15 NOTE — Progress Notes (Signed)
ANTICOAGULATION CONSULT NOTE - Follow Up Consult  Pharmacy Consult for Argatroban Indication: history of VTE  Allergies  Allergen Reactions  . Tramadol Palpitations    "Made my chest feel tight"  . Amoxicillin     REACTION: rash  . Penicillins Rash    Has patient had a PCN reaction causing immediate rash, facial/tongue/throat swelling, SOB or lightheadedness with hypotension: unknown Has patient had a PCN reaction causing severe rash involving mucus membranes or skin necrosis: unknown Has patient had a PCN reaction that required hospitalization: no  Has patient had a PCN reaction occurring within the last 10 years: no  If all of the above answers are "NO", then may proceed with Cephalosporin use.     Patient Measurements: Height: '5\' 8"'$  (172.7 cm) Weight: 204 lb 2.3 oz (92.6 kg) IBW/kg (Calculated) : 68.4  Vital Signs: Temp: 97.9 F (36.6 C) (01/05 0700) Temp Source: Oral (01/05 0700) BP: 105/68 (01/05 0700) Pulse Rate: 90 (01/05 0700)  Labs:  Recent Labs  07/12/16 1332 07/13/16 0956  07/14/16 0514  07/14/16 1520 07/14/16 1829 07/15/16 0339  HGB 11.7*  --   --  8.2*  --   --   --  7.0*  HCT 34.9*  --   --  25.2*  --   --   --  21.4*  PLT 73*  --   --  35*  --   --   --  37*  APTT  --   --   < > 112*  < > 63* 78* 80*  CREATININE  --  0.81  --  1.56*  --   --   --  1.08  < > = values in this interval not displayed.  Estimated Creatinine Clearance: 65.3 mL/min (by C-G formula based on SCr of 1.08 mg/dL).   Medications:  Scheduled:  . cefTRIAXone (ROCEPHIN)  IV  2 g Intravenous Q24H  . desmopressin  10 mcg Nasal QHS  . dexamethasone  4 mg Intravenous Q12H  . insulin aspart  0-15 Units Subcutaneous Q4H  . mouth rinse  15 mL Mouth Rinse BID  . metronidazole  500 mg Intravenous Q8H  . potassium chloride (KCL MULTIRUN) 30 mEq in 265 mL IVPB  30 mEq Intravenous Once  . potassium phosphate IVPB (mmol)  15 mmol Intravenous Once    Assessment: 76 y/o M with  metastatic NSCLC, was on argatroban in past due to concern for HIT, SRA was negative last month, CCM prefers to cont with argatroban for now.   Pltc is low but stable x 2 since Argatroban resumed, aPTT is therapeutic x 3.  Hg has decreased some but no bleeding noted.  Goal of Therapy:  APTT 50-90 sec Monitor platelets by anticoagulation protocol: Yes   Plan:  Continue Argatroban 73mg/kg/min Daily CBC and aPTT Watch for s/s of bleeding  KGracy Bruins PBeaver Dam Hospital

## 2016-07-15 NOTE — Progress Notes (Signed)
Patient's family spoke with RN on unit doing leadership rounds about palliative care. Wife stated that they did not want anyone from palliative care to come see the patient or the family. She stated that they know what is happening and have resources. Rn spoke to palliative care, Juanda Crumble, NP about the families wishes.

## 2016-07-15 NOTE — Progress Notes (Signed)
Palliative Medicine progress note:   Received message from [facility], RN that patient's spouse requested no more visits from palliative service. Contacted Hayley for clarification- Hayley noted that spouse was upset due to palliative mentioning that patient had had pneumonia and she was not aware of this previous diagnosis. Aspiration pneumonia (listed on patient's 1/3 H&P) was mentioned during our conversation as one of many factors that will affect patient's recovery, but was not the focus of our conversation- please see my full consult note from 07/14/2016 for details.   Palliative medicine will sign off for now. Please feel free to reconsult as needed.   Mariana Kaufman, AGNP-C Palliative Medicine  Please call Palliative Medicine team phone with any questions [phone]. For individual providers please see AMION.

## 2016-07-15 NOTE — Telephone Encounter (Signed)
Pt left a message concerned about appt next week. He is in the hospital . I returned his call and told pt we will r/s appt

## 2016-07-16 ENCOUNTER — Inpatient Hospital Stay (HOSPITAL_COMMUNITY): Payer: Medicare Other

## 2016-07-16 DIAGNOSIS — C9000 Multiple myeloma not having achieved remission: Secondary | ICD-10-CM

## 2016-07-16 DIAGNOSIS — K5669 Other partial intestinal obstruction: Secondary | ICD-10-CM

## 2016-07-16 DIAGNOSIS — T17908A Unspecified foreign body in respiratory tract, part unspecified causing other injury, initial encounter: Secondary | ICD-10-CM

## 2016-07-16 DIAGNOSIS — J90 Pleural effusion, not elsewhere classified: Secondary | ICD-10-CM

## 2016-07-16 DIAGNOSIS — C3491 Malignant neoplasm of unspecified part of right bronchus or lung: Secondary | ICD-10-CM

## 2016-07-16 DIAGNOSIS — D696 Thrombocytopenia, unspecified: Secondary | ICD-10-CM

## 2016-07-16 DIAGNOSIS — R945 Abnormal results of liver function studies: Secondary | ICD-10-CM

## 2016-07-16 DIAGNOSIS — D649 Anemia, unspecified: Secondary | ICD-10-CM

## 2016-07-16 LAB — BASIC METABOLIC PANEL
ANION GAP: 8 (ref 5–15)
BUN: 38 mg/dL — AB (ref 6–20)
CALCIUM: 7.6 mg/dL — AB (ref 8.9–10.3)
CO2: 23 mmol/L (ref 22–32)
CREATININE: 0.82 mg/dL (ref 0.61–1.24)
Chloride: 113 mmol/L — ABNORMAL HIGH (ref 101–111)
GFR calc Af Amer: >60 mL/min (ref >60)
GFR calc non Af Amer: >60 mL/min (ref >60)
GLUCOSE: 199 mg/dL — AB (ref 65–99)
Potassium: 3.6 mmol/L (ref 3.5–5.1)
Sodium: 144 mmol/L (ref 135–145)

## 2016-07-16 LAB — GLUCOSE, CAPILLARY
GLUCOSE-CAPILLARY: 199 mg/dL — AB (ref 65–99)
GLUCOSE-CAPILLARY: 105 mg/dL — AB (ref 65–99)
GLUCOSE-CAPILLARY: 230 mg/dL — AB (ref 65–99)
Glucose-Capillary: 120 mg/dL — ABNORMAL HIGH (ref 65–99)
Glucose-Capillary: 177 mg/dL — ABNORMAL HIGH (ref 65–99)
Glucose-Capillary: 188 mg/dL — ABNORMAL HIGH (ref 65–99)
Glucose-Capillary: 188 mg/dL — ABNORMAL HIGH (ref 65–99)

## 2016-07-16 LAB — HEPARIN INDUCED PLATELET AB (HIT ANTIBODY): Heparin Induced Plt Ab: 0.248 OD (ref 0.000–0.400)

## 2016-07-16 LAB — FERRITIN: Ferritin: 1489 ng/mL — ABNORMAL HIGH (ref 24–336)

## 2016-07-16 LAB — IRON AND TIBC
IRON: 81 ug/dL (ref 45–182)
SATURATION RATIOS: 60 % — AB (ref 17.9–39.5)
TIBC: 136 ug/dL — AB (ref 250–450)
UIBC: 55 ug/dL

## 2016-07-16 LAB — APTT: APTT: 70 s — AB (ref 24–36)

## 2016-07-16 LAB — RETICULOCYTES
RBC.: 2.46 MIL/uL — AB (ref 4.22–5.81)
RETIC COUNT ABSOLUTE: 51.7 10*3/uL (ref 19.0–186.0)
RETIC CT PCT: 2.1 % (ref 0.4–3.1)

## 2016-07-16 LAB — CBC
HCT: 20 % — ABNORMAL LOW (ref 39.0–52.0)
HEMATOCRIT: 23.3 % — AB (ref 39.0–52.0)
Hemoglobin: 6.4 g/dL — CL (ref 13.0–17.0)
Hemoglobin: 7.7 g/dL — ABNORMAL LOW (ref 13.0–17.0)
MCH: 32.3 pg (ref 26.0–34.0)
MCH: 31.3 pg (ref 26.0–34.0)
MCHC: 32 g/dL (ref 30.0–36.0)
MCHC: 33 g/dL (ref 30.0–36.0)
MCV: 101 fL — ABNORMAL HIGH (ref 78.0–100.0)
MCV: 94.7 fL (ref 78.0–100.0)
PLATELETS: 33 10*3/uL — AB (ref 150–400)
Platelets: 63 10*3/uL — ABNORMAL LOW (ref 150–400)
RBC: 1.98 MIL/uL — AB (ref 4.22–5.81)
RBC: 2.46 MIL/uL — ABNORMAL LOW (ref 4.22–5.81)
RDW: 19.6 % — ABNORMAL HIGH (ref 11.5–15.5)
RDW: 21.4 % — AB (ref 11.5–15.5)
WBC: 8.1 10*3/uL (ref 4.0–10.5)
WBC: 10.9 10*3/uL — ABNORMAL HIGH (ref 4.0–10.5)

## 2016-07-16 LAB — DIRECT ANTIGLOBULIN TEST (NOT AT ARMC)
DAT, IGG: NEGATIVE
DAT, complement: NEGATIVE

## 2016-07-16 LAB — FOLATE: Folate: 12.5 ng/mL (ref >5.9)

## 2016-07-16 LAB — PHOSPHORUS: Phosphorus: 2.3 mg/dL — ABNORMAL LOW (ref 2.5–4.6)

## 2016-07-16 LAB — PREPARE RBC (CROSSMATCH)

## 2016-07-16 LAB — VITAMIN B12: Vitamin B-12: 551 pg/mL (ref 180–914)

## 2016-07-16 LAB — LACTATE DEHYDROGENASE: LDH: 355 U/L — AB (ref 98–192)

## 2016-07-16 MED ORDER — PANTOPRAZOLE SODIUM 40 MG IV SOLR
40.0000 mg | Freq: Two times a day (BID) | INTRAVENOUS | Status: DC
  Administered 2016-07-16 – 2016-07-24 (×17): 40 mg via INTRAVENOUS
  Filled 2016-07-16 (×17): qty 40

## 2016-07-16 MED ORDER — INSULIN ASPART 100 UNIT/ML ~~LOC~~ SOLN
0.0000 [IU] | SUBCUTANEOUS | Status: DC
Start: 2016-07-16 — End: 2016-07-24
  Administered 2016-07-16: 5 [IU] via SUBCUTANEOUS
  Administered 2016-07-16: 3 [IU] via SUBCUTANEOUS
  Administered 2016-07-17: 5 [IU] via SUBCUTANEOUS
  Administered 2016-07-17 (×2): 3 [IU] via SUBCUTANEOUS
  Administered 2016-07-18 – 2016-07-19 (×4): 2 [IU] via SUBCUTANEOUS
  Administered 2016-07-20: 5 [IU] via SUBCUTANEOUS
  Administered 2016-07-20: 3 [IU] via SUBCUTANEOUS
  Administered 2016-07-21: 2 [IU] via SUBCUTANEOUS
  Administered 2016-07-21: 3 [IU] via SUBCUTANEOUS
  Administered 2016-07-21 – 2016-07-23 (×4): 2 [IU] via SUBCUTANEOUS
  Administered 2016-07-23 (×3): 3 [IU] via SUBCUTANEOUS
  Administered 2016-07-24: 2 [IU] via SUBCUTANEOUS
  Administered 2016-07-24: 3 [IU] via SUBCUTANEOUS

## 2016-07-16 MED ORDER — SODIUM CHLORIDE 0.9 % IV SOLN
30.0000 meq | Freq: Once | INTRAVENOUS | Status: AC
  Administered 2016-07-16: 30 meq via INTRAVENOUS
  Filled 2016-07-16: qty 15

## 2016-07-16 MED ORDER — SODIUM CHLORIDE 0.9 % IV SOLN: Freq: Once | INTRAVENOUS | Status: DC

## 2016-07-16 MED ORDER — FUROSEMIDE 10 MG/ML IJ SOLN
20.0000 mg | Freq: Once | INTRAMUSCULAR | Status: AC
  Administered 2016-07-16: 20 mg via INTRAVENOUS
  Filled 2016-07-16: qty 2

## 2016-07-16 MED ORDER — FAT EMULSION 20 % IV EMUL
240.0000 mL | INTRAVENOUS | Status: AC
  Administered 2016-07-16: 240 mL via INTRAVENOUS
  Filled 2016-07-16: qty 250

## 2016-07-16 MED ORDER — CLINIMIX/DEXTROSE (5/15) 5 % IV SOLN
INTRAVENOUS | Status: AC
  Administered 2016-07-16: 18:00:00 via INTRAVENOUS
  Filled 2016-07-16: qty 1992

## 2016-07-16 MED ORDER — POTASSIUM PHOSPHATES 15 MMOLE/5ML IV SOLN
10.0000 mmol | Freq: Once | INTRAVENOUS | Status: AC
  Administered 2016-07-16: 10 mmol via INTRAVENOUS
  Filled 2016-07-16: qty 3.33

## 2016-07-16 MED ORDER — SODIUM CHLORIDE 0.9 % IV SOLN
Freq: Once | INTRAVENOUS | Status: AC
  Administered 2016-07-16: 11:00:00 via INTRAVENOUS

## 2016-07-16 NOTE — Progress Notes (Signed)
ANTICOAGULATION CONSULT NOTE - Follow Up Consult  Pharmacy Consult for Argatroban Indication: history of VTE  Allergies  Allergen Reactions  . Tramadol Palpitations    "Made my chest feel tight"  . Amoxicillin     REACTION: rash  . Penicillins Rash    Has patient had a PCN reaction causing immediate rash, facial/tongue/throat swelling, SOB or lightheadedness with hypotension: unknown Has patient had a PCN reaction causing severe rash involving mucus membranes or skin necrosis: unknown Has patient had a PCN reaction that required hospitalization: no  Has patient had a PCN reaction occurring within the last 10 years: no  If all of the above answers are "NO", then may proceed with Cephalosporin use.     Patient Measurements: Height: '5\' 8"'$  (172.7 cm) Weight: 204 lb 2.3 oz (92.6 kg) IBW/kg (Calculated) : 68.4  Vital Signs: Temp: 97.8 F (36.6 C) (01/06 0420) Temp Source: Axillary (01/06 0420) BP: 99/58 (01/06 0023) Pulse Rate: 87 (01/06 0023)  Labs:  Recent Labs  07/14/16 0514  07/14/16 1829 07/15/16 0339 07/16/16 0448  HGB 8.2*  --   --  7.0* 6.4*  HCT 25.2*  --   --  21.4* 20.0*  PLT 35*  --   --  37* 33*  APTT 112*  < > 78* 80* 70*  CREATININE 1.56*  --   --  1.08 0.82  < > = values in this interval not displayed.  Estimated Creatinine Clearance: 86 mL/min (by C-G formula based on SCr of 0.82 mg/dL).   Medications:  Scheduled:  . sodium chloride   Intravenous Once  . cefTRIAXone (ROCEPHIN)  IV  2 g Intravenous Q24H  . desmopressin  10 mcg Nasal QHS  . dexamethasone  4 mg Intravenous Q12H  . insulin aspart  0-15 Units Subcutaneous Q4H  . mouth rinse  15 mL Mouth Rinse BID  . metronidazole  500 mg Intravenous Q8H    Assessment: 76 y/o M with metastatic NSCLC, was on argatroban in past due to concern for HIT, SRA was negative last month, CCM prefers to cont with argatroban for now.   Pltc is low but stable x 3 since Argatroban resumed, aPTT is therapeutic .   Hgb has decreased significantly but no bleeding noted. 1 unit packed RBC to be transfused today.   Goal of Therapy:  APTT 50-90 sec Monitor platelets by anticoagulation protocol: Yes   Plan:  Continue Argatroban 46mg/kg/min Daily CBC and aPTT Watch for s/s of bleeding Follow-up plan for continuation of argatroban.  EUvaldo Bristle PharmD PGY1 Pharmacy Resident  Pager: [phone]

## 2016-07-16 NOTE — Progress Notes (Signed)
PROGRESS NOTE    [name]  RFF:[id number] DOB: [birth year] DOA: 07/13/2016 PCP: [name], MD    Brief Narrative: 76 year old man with history significant for MM and stage IV NSCLC now with mets to brain discovered 04/2016 undergoing radiotherapy to brain lesions and Ketruda, pulmonary embolism on Xarelto. He has been admitted several times in the past few months, initially for SBO back in October for SBO, which resolved with conservative therapy. He was deemed a poor surgical candidate, but it was felt that if he could regain some strength he could undergo surgery. He was admitted again 12/15 with projectile vomiting and SOB. He was then found to have GNR bacteremia. Course became complicated by shock and DVT while he was off DOAC for colitis related GIB. IVC filter placed. He was started on TPN 1/2 and transferred to CIR 1/3. Later that day he developed vomiting again where and was suspected to have aspirated developing hypoxia and significant respiratory distress.    Assessment & Plan:   Active Problems:   Primary malignant neoplasm of lung metastatic to other site [facility])   Pressure injury of skin   Aspiration into airway   Goals of care, counseling/discussion   Palliative care by specialist   1-Recurrent SBO; NG tube intermittent suctioning.  Continue parenteral nutrition.  Plan for surgery next week, when nutritional status improved.    Acute hypoxic Respiratory failure, secondary to possible aspiration event; Aspiration pneumonitis versus pneumonia suplemmental oxygen.  On IV ceftriaxone and flagyl.  Blood culture no growth to date.  Chest x ray increasing infiltrate  left lung.. Follow up recommended.  Will order IV lasix, taper oxygen as needed.   NSCLC (80% PDL1+)-->treating w/ [name] >Dr. [name]  H/o Multiple Myeloma w/ skull lesion s/p XRT 2016 in remission.  H/o PE and DVT on Eliquis-->new DVTs on NOAC 12/21 > S/P temp filter 12/1 H/O HIT  On Argatroban.     Thrombocytopenia, Acute blood loss anemia. Now on argatroban.  HIT panel; repeated 1-05 pending.  To received one unit PRBC. Platelet transfusion.  Will consult oncology.  Add IV protonix.  Repeat hb tonight. Might need further transfusion. Blood transfusion has to be irradiated and leukocyte reduce.    Chronic diastolic HF; Monitor volume status.  Will give one time dose of lasix after transfusion.   Transaminases; trending down. Repeat labs in am.   Hypoalbuminemia;  Check labs in am.   AKI;  Cr decreased to 1.08 from 1.5. Continue to follow trend.  Hyponatremia; resolved.   Addison diseases; decadron, Desmopressin.   Stage 2 sacral ulcer noted 2x2 cms   DVT prophylaxis: argatroban.  Code Status: DNR Family Communication: sister at bedside.  Disposition Plan: remain    Consultants:   Surgery  Oncology    Procedures: none   Antimicrobials: ceftriaxone and flagyl.    Subjective: He relates mild abdominal pain, passing some gas.    Objective: Vitals:   07/15/16 1956 07/16/16 0023 07/16/16 0420 07/16/16 0700  BP: 102/62 (!) 99/58  97/67  Pulse: 91 87  83  Resp: 20 (!) 25  18  Temp: 99.7 F (37.6 C) 97.3 F (36.3 C) 97.8 F (36.6 C) 97.8 F (36.6 C)  TempSrc: Oral Axillary Axillary Axillary  SpO2: 93% 92%  95%  Weight:      Height:        Intake/Output Summary (Last 24 hours) at 07/16/16 0957 Last data filed at 07/16/16 0824  Gross per 24 hour  Intake  1011.77 ml  Output             2975 ml  Net         -1963.23 ml   Filed Weights   07/13/16 2130  Weight: 92.6 kg (204 lb 2.3 oz)    Examination:  General exam: Appears calm and comfortable ng ti=ube in place.  Respiratory system: Clear to auscultation. Respiratory effort normal. Cardiovascular system: S1 & S2 heard, RRR. No JVD, murmurs, rubs, gallops or clicks. No pedal edema. Gastrointestinal system: Abdomen is distended, soft and mild tender.  Central nervous system:  Alert and oriented. No focal neurological deficits. Extremities: Symmetric 5 x 5 power. Plus 2 edema.  Skin: ulcers lower extremity  Psychiatry: Judgement and insight appear normal. Mood & affect appropriate.     Data Reviewed: I have personally reviewed following labs and imaging studies  CBC:  Recent Labs Lab 07/10/16 0608 07/11/16 0405 07/12/16 1332 07/14/16 0514 07/15/16 0339 07/16/16 0448  WBC 7.1 6.8 11.8* 4.4 6.7 8.1  NEUTROABS 6.7 6.4  --  4.0  --   --   HGB 7.8* 7.3* 11.7* 8.2* 7.0* 6.4*  HCT 24.3* 22.1* 34.9* 25.2* 21.4* 20.0*  MCV 101.3* 99.5 99.1 101.6* 98.6 101.0*  PLT 41* 54* 73* 35* 37* 33*   Basic Metabolic Panel:  Recent Labs Lab 07/10/16 0608 07/11/16 0405 07/12/16 0413 07/13/16 0956 07/14/16 0514 07/15/16 0339 07/16/16 0448  NA 140 139 136 147* 144 147* 144  K 3.8 3.3* 3.8 3.4* 4.0 3.4* 3.6  CL 108 107 105 112* 114* 114* 113*  CO2 25 26 18* '27 24 26 23  '$ GLUCOSE 133* 153* 229* 152* 216* 150* 199*  BUN '20 18 18 '$ 23* 38* 37* 38*  CREATININE 0.56* 0.54* 0.77 0.81 1.56* 1.08 0.82  CALCIUM 7.3* 7.5* 7.7* 8.2* 7.9* 7.7* 7.6*  MG 2.1 2.1 2.0  --  2.1 2.1  --   PHOS 1.6* 1.5* 2.6 3.1 4.4 2.7 2.3*   GFR: Estimated Creatinine Clearance: 86 mL/min (by C-G formula based on SCr of 0.82 mg/dL). Liver Function Tests:  Recent Labs Lab 07/10/16 2706 07/11/16 0405 07/12/16 0413 07/14/16 0514 07/15/16 0339  AST 17 16 45* 318* 138*  ALT 13* 14* 27 168* 151*  ALKPHOS 60 64 97 83 74  BILITOT 0.7 0.3 0.7 0.5 0.2*  PROT 4.0* 4.0* 5.1* 3.8* 3.8*  ALBUMIN 1.7* 1.8* 2.2* 1.5* 1.4*   No results for input(s): LIPASE, AMYLASE in the last 168 hours. No results for input(s): AMMONIA in the last 168 hours. Coagulation Profile: No results for input(s): INR, PROTIME in the last 168 hours. Cardiac Enzymes: No results for input(s): CKTOTAL, CKMB, CKMBINDEX, TROPONINI in the last 168 hours. BNP (last 3 results) No results for input(s): PROBNP in the last 8760  hours. HbA1C: No results for input(s): HGBA1C in the last 72 hours. CBG:  Recent Labs Lab 07/15/16 1629 07/15/16 2009 07/16/16 0036 07/16/16 0440 07/16/16 0826  GLUCAP 115* 171* 177* 188* 199*   Lipid Profile:  Recent Labs  07/14/16 0514  TRIG 94   Thyroid Function Tests: No results for input(s): TSH, T4TOTAL, FREET4, T3FREE, THYROIDAB in the last 72 hours. Anemia Panel: No results for input(s): VITAMINB12, FOLATE, FERRITIN, TIBC, IRON, RETICCTPCT in the last 72 hours. Sepsis Labs: No results for input(s): PROCALCITON, LATICACIDVEN in the last 168 hours.  No results found for this or any previous visit (from the past 240 hour(s)).       Radiology Studies: Dg Chest [facility]  1 View  Result Date: 07/16/2016 CLINICAL DATA:  Respiratory failure EXAM: PORTABLE CHEST 1 VIEW COMPARISON:  07/13/2016 FINDINGS: Cardiac shadow is mildly enlarged. Right chest wall port is again seen and stable. Nasogastric catheter is now noted coiled within the stomach. Inspiratory effort is poor with diffuse increased density throughout the left lung new from the prior exam. No pneumothorax is noted. No sizable effusion is seen. IMPRESSION: Increasing infiltrative density in the left lung. Continued follow-up is recommended. Nasogastric catheter within the stomach. Electronically Signed   By: Inez Catalina M.D.   On: 07/16/2016 08:16   Dg Abd Portable 1v-small Bowel Obstruction Protocol-initial, 8 Hr Delay  Result Date: 07/15/2016 CLINICAL DATA:  Small bowel obstruction, follow-up evaluation. EXAM: PORTABLE ABDOMEN - 1 VIEW COMPARISON:  Abdominal radiograph September 10, 2016 FINDINGS: Multiple loops of gas-filled small bowel measure up to 3.1 cm, decreased from prior examination. Small amount of large bowel gas. No intra- abdominal mass effect or pathologic calcifications. Inferior vena cava filter projects of the RIGHT at L2-3. Nasogastric tube looped in proximal stomach. Mild vascular calcifications.  Degenerative change of the lumbar spine. IMPRESSION: Resolving small bowel obstruction, nasogastric tube in proximal stomach. Electronically Signed   By: Elon Alas M.D.   On: 07/15/2016 03:20   Dg Femur Port, Min 2 Views Right  Result Date: 07/14/2016 CLINICAL DATA:  Pain distal femur. History of multiple myeloma and lung carcinoma EXAM: RIGHT FEMUR PORTABLE 2 VIEW COMPARISON:  None. FINDINGS: Portions of the proximal femur are not visualized. Frontal and lateral views of the mid and distal portions of the femur obtained. There is a total knee replacement with prosthetic components well-seated. No fracture or dislocation. No knee joint effusion. In regions which are visualized, there is no abnormal periosteal reaction. No blastic or lytic bone lesions. IMPRESSION: In visualized portions of the right femur, there is no blastic or lytic bone lesion. No abnormal periosteal reaction. No fracture or dislocation. No knee joint effusion. Total knee replacement prosthetic components appear well seated. Electronically Signed   By: Lowella Grip III M.D.   On: 07/14/2016 16:03        Scheduled Meds: . sodium chloride   Intravenous Once  . cefTRIAXone (ROCEPHIN)  IV  2 g Intravenous Q24H  . desmopressin  10 mcg Nasal QHS  . dexamethasone  4 mg Intravenous Q12H  . insulin aspart  0-15 Units Subcutaneous Q4H  . mouth rinse  15 mL Mouth Rinse BID  . metronidazole  500 mg Intravenous Q8H  . potassium chloride (KCL MULTIRUN) 30 mEq in 265 mL IVPB  30 mEq Intravenous Once   Continuous Infusions: . sodium chloride 10 mL/hr at 07/15/16 1900  . argatroban 1 mcg/kg/min (07/16/16 0445)  . TPN (CLINIMIX) Adult without lytes 50 mL/hr at 07/16/16 0900   And  . fat emulsion 240 mL (07/15/16 1731)     LOS: 3 days    Time spent: 35 minutes.     Elmarie Shiley, MD [facility] Pager [phone]  If 7PM-7AM, please contact night-coverage [email or web address] Password [email or web address] 07/16/2016, 9:57  AM

## 2016-07-16 NOTE — Progress Notes (Signed)
[facility] Progress Note     Subjective: Intermittent crampy abdominal pain. +flatus. No BM NGT 1400 cc/24h dark brown. Really wants surgery.   Objective: Vital signs in last 24 hours: Temp:  [97.3 F (36.3 C)-99.7 F (37.6 C)] 97.8 F (36.6 C) (01/06 0700) Pulse Rate:  [83-93] 83 (01/06 0700) Resp:  [13-25] 18 (01/06 0700) BP: (97-102)/(58-68) 97/67 (01/06 0700) SpO2:  [92 %-96 %] 95 % (01/06 0700)    Intake/Output from previous day: 01/05 0701 - 01/06 0700 In: 1011.8 [I.V.:376.8; NG/GT:30; IV Piggyback:605] Out: 2525 [Urine:1125; Emesis/NG output:1400] Intake/Output this shift: Total I/O In: -  Out: 450 [Urine:450]  PE: Gen:  Alert, NAD, pleasant Card:  RRR Pulm:  Unlabored.  Abd: Soft, nontender, mild distention, minimal bowel sounds Ext:  Nontender, pitting LE edema bilaterally  Lab Results:   Recent Labs  07/15/16 0339 07/16/16 0448  WBC 6.7 8.1  HGB 7.0* 6.4*  HCT 21.4* 20.0*  PLT 37* 33*   BMET  Recent Labs  07/15/16 0339 07/16/16 0448  NA 147* 144  K 3.4* 3.6  CL 114* 113*  CO2 26 23  GLUCOSE 150* 199*  BUN 37* 38*  CREATININE 1.08 0.82  CALCIUM 7.7* 7.6*   PT/INR No results for input(s): LABPROT, INR in the last 72 hours. CMP     Component Value Date/Time   NA 144 07/16/2016 0448   NA 132 (L) 06/15/2016 0852   K 3.6 07/16/2016 0448   K 4.1 06/15/2016 0852   CL 113 (H) 07/16/2016 0448   CL 109 (H) 12/31/2012 0759   CO2 23 07/16/2016 0448   CO2 23 06/15/2016 0852   GLUCOSE 199 (H) 07/16/2016 0448   GLUCOSE 101 06/15/2016 0852   GLUCOSE 116 (H) 12/31/2012 0759   BUN 38 (H) 07/16/2016 0448   BUN 19.0 06/15/2016 0852   CREATININE 0.82 07/16/2016 0448   CREATININE 0.8 06/15/2016 0852   CALCIUM 7.6 (L) 07/16/2016 0448   CALCIUM 8.5 06/15/2016 0852   PROT 3.8 (L) 07/15/2016 0339   PROT 5.6 (L) 06/15/2016 0852   ALBUMIN 1.4 (L) 07/15/2016 0339   ALBUMIN 2.8 (L) 06/15/2016 0852   AST 138 (H) 07/15/2016 0339   AST  10 06/15/2016 0852   ALT 151 (H) 07/15/2016 0339   ALT 27 06/15/2016 0852   ALKPHOS 74 07/15/2016 0339   ALKPHOS 77 06/15/2016 0852   BILITOT 0.2 (L) 07/15/2016 0339   BILITOT 0.99 06/15/2016 0852   GFRNONAA >60 07/16/2016 0448   GFRAA >60 07/16/2016 0448   Lipase     Component Value Date/Time   LIPASE 21 06/24/2016 1622       Studies/Results: Dg Chest Port 1 View  Result Date: 07/16/2016 CLINICAL DATA:  Respiratory failure EXAM: PORTABLE CHEST 1 VIEW COMPARISON:  07/13/2016 FINDINGS: Cardiac shadow is mildly enlarged. Right chest wall port is again seen and stable. Nasogastric catheter is now noted coiled within the stomach. Inspiratory effort is poor with diffuse increased density throughout the left lung new from the prior exam. No pneumothorax is noted. No sizable effusion is seen. IMPRESSION: Increasing infiltrative density in the left lung. Continued follow-up is recommended. Nasogastric catheter within the stomach. Electronically Signed   By: Inez Catalina M.D.   On: 07/16/2016 08:16   Dg Abd Portable 1v-small Bowel Obstruction Protocol-initial, 8 Hr Delay  Result Date: 07/15/2016 CLINICAL DATA:  Small bowel obstruction, follow-up evaluation. EXAM: PORTABLE ABDOMEN - 1 VIEW COMPARISON:  Abdominal radiograph September 10, 2016 FINDINGS: Multiple loops of  gas-filled small bowel measure up to 3.1 cm, decreased from prior examination. Small amount of large bowel gas. No intra- abdominal mass effect or pathologic calcifications. Inferior vena cava filter projects of the RIGHT at L2-3. Nasogastric tube looped in proximal stomach. Mild vascular calcifications. Degenerative change of the lumbar spine. IMPRESSION: Resolving small bowel obstruction, nasogastric tube in proximal stomach. Electronically Signed   By: Elon Alas M.D.   On: 07/15/2016 03:20   Dg Femur Port, Min 2 Views Right  Result Date: 07/14/2016 CLINICAL DATA:  Pain distal femur. History of multiple myeloma and lung carcinoma  EXAM: RIGHT FEMUR PORTABLE 2 VIEW COMPARISON:  None. FINDINGS: Portions of the proximal femur are not visualized. Frontal and lateral views of the mid and distal portions of the femur obtained. There is a total knee replacement with prosthetic components well-seated. No fracture or dislocation. No knee joint effusion. In regions which are visualized, there is no abnormal periosteal reaction. No blastic or lytic bone lesions. IMPRESSION: In visualized portions of the right femur, there is no blastic or lytic bone lesion. No abnormal periosteal reaction. No fracture or dislocation. No knee joint effusion. Total knee replacement prosthetic components appear well seated. Electronically Signed   By: Lowella Grip III M.D.   On: 07/14/2016 16:03    Anti-infectives: Anti-infectives    Start     Dose/Rate Route Frequency Ordered Stop   07/14/16 2200  cefTRIAXone (ROCEPHIN) 2 g in dextrose 5 % 50 mL IVPB     2 g 100 mL/hr over 30 Minutes Intravenous Every 24 hours 07/14/16 0915     07/13/16 2200  cefTRIAXone (ROCEPHIN) 1 g in dextrose 5 % 50 mL IVPB  Status:  Discontinued     1 g 100 mL/hr over 30 Minutes Intravenous Every 24 hours 07/13/16 2132 07/14/16 0915   07/13/16 2200  metroNIDAZOLE (FLAGYL) IVPB 500 mg     500 mg 100 mL/hr over 60 Minutes Intravenous Every 8 hours 07/13/16 2132         Assessment/Plan SBO: recurrent in the setting of stage IV lung cancer, bilateral DVTs, Hx of PE on Xarelto - No evidence of carcinomatosis, PMH appendectomy - NGT 1400 cc/24 h - SB protocol: abd film shows improvement in SB pattern - less dilated loops of SB; I dont appreciate and contrast on today's film. - continue cyclic TPN, check prealbumin Monday - if patient nutrition status continues to improve then Dr. Barry Dienes is tentatively planning to perform exploratory laparotomy on Tuesday 07/19/16.  Continue to hold Xarelto (last dose 07/12/16 @ 1710) pending possible surgery, NPO, NG tube.   Acute hypoxic  respiratory failure secondary to possible aspiration event - supplemental O2, Ceftriaxone 1/3>> NSCLC - being treated with Keytruda, Dr. [name] Hx PE on Eliquis- held  Stage 2 sacral ulcer  Dispo: continue TPN, continue NGT to LIWS  Prealbumin Monday    LOS: 3 days    Jill Alexanders , [facility] 07/16/2016, 8:39 AM Pager: [phone] Consults: [phone] Mon-Fri 7:00 am-4:30 pm Sat-Sun 7:00 am-11:30 am

## 2016-07-16 NOTE — Progress Notes (Signed)
HEMATOLOGY-ONCOLOGY PROGRESS NOTE  SUBJECTIVE: Complex medical history adm with rec SBO. Also has recurrent PE now on Argatroban; Metastatic lung cancer received SRS to brain and 1 dose of Pembrolizumab. Patient has been in and out of hospital for the past couple of months. He was found to be severely anemic and decreasing platelet counts Also has H/O Myeloma s/P Auto SCT  REVIEW OF SYSTEMS:   Constitutional: Denies fevers, chills or abnormal weight loss Eyes: Denies blurriness of vision Ears, nose, mouth, throat, and face: Denies mucositis or sore throat Respiratory: Denies cough, dyspnea or wheezes Cardiovascular: Denies palpitation, chest discomfort Gastrointestinal:  NG in place slightly bloody Skin: Denies abnormal skin rashes Lymphatics: Denies new lymphadenopathy or easy bruising Neurological:Gen weakness Behavioral/Psych: Mood is stable, no new changes  Extremities: No lower extremity edema  All other systems were reviewed with the patient and are negative.  I have reviewed the past medical history, past surgical history, social history and family history with the patient and they are unchanged from previous note.   PHYSICAL EXAMINATION: ECOG PERFORMANCE STATUS: 3 - Symptomatic, >50% confined to bed  Vitals:   07/16/16 0420 07/16/16 0700  BP:  97/67  Pulse:  83  Resp:  18  Temp: 97.8 F (36.6 C) 97.8 F (36.6 C)   Filed Weights   07/13/16 2130  Weight: 204 lb 2.3 oz (92.6 kg)    GENERAL:alert, no distress and comfortable SKIN: skin color, texture, turgor are normal, no rashes or significant lesions EYES: normal, Conjunctiva are pink and non-injected, sclera clear OROPHARYNX:no exudate, no erythema and lips, buccal mucosa, and tongue normal  NECK: supple, thyroid normal size, non-tender, without nodularity LYMPH:  no palpable lymphadenopathy in the cervical, axillary or inguinal LUNGS: clear to auscultation and percussion with normal breathing effort HEART:  regular rate & rhythm and no murmurs and no lower extremity edema ABDOMEN:abdomen soft, non-tender and normal bowel sounds Musculoskeletal:no cyanosis of digits and no clubbing  NEURO: alert & oriented x 3 with fluent speech, no focal motor/sensory deficits  LABORATORY DATA:  I have reviewed the data as listed CMP Latest Ref Rng & Units 07/16/2016 07/15/2016 07/14/2016  Glucose 65 - 99 mg/dL 199(H) 150(H) 216(H)  BUN 6 - 20 mg/dL 38(H) 37(H) 38(H)  Creatinine 0.61 - 1.24 mg/dL 0.82 1.08 1.56(H)  Sodium 135 - 145 mmol/L 144 147(H) 144  Potassium 3.5 - 5.1 mmol/L 3.6 3.4(L) 4.0  Chloride 101 - 111 mmol/L 113(H) 114(H) 114(H)  CO2 22 - 32 mmol/L '23 26 24  '$ Calcium 8.9 - 10.3 mg/dL 7.6(L) 7.7(L) 7.9(L)  Total Protein 6.5 - 8.1 g/dL - 3.8(L) 3.8(L)  Total Bilirubin 0.3 - 1.2 mg/dL - 0.2(L) 0.5  Alkaline Phos 38 - 126 U/L - 74 83  AST 15 - 41 U/L - 138(H) 318(H)  ALT 17 - 63 U/L - 151(H) 168(H)    Lab Results  Component Value Date   WBC 8.1 07/16/2016   HGB 6.4 (LL) 07/16/2016   HCT 20.0 (L) 07/16/2016   MCV 101.0 (H) 07/16/2016   PLT 33 (L) 07/16/2016   NEUTROABS 4.0 07/14/2016    ASSESSMENT AND PLAN: 1. Severe Anemia and Thrombocytopenia: Getting 1 unit of PRBC and I also rec platelet transfusion due to GI bleed. 2. Anemia : will get retic, hapto, LDH and Coombs test 3. Thrombocytopenia: Transfuse platelets. Suspect bone marrrow suppression Vs ITP 4. Rec PE: On Argatroban 5. Elevated LFTs: Getting better Unclear etiology. ? Autoimmune cause 6. Met Lung ca: Not received immunotherapy  due to multiple hospitalizations 7. Myeloma: Remission 8. SBO: Patient reports that he may get surgery next Tuesday if his general condition improves Will follow

## 2016-07-16 NOTE — Progress Notes (Signed)
[facility] Progress Note Patient Name: [name] DOB: 01/22/1941 MRN: [id number]   Date of Service  07/16/2016  HPI/Events of Note  Hb 6.4,  Hb downtrending since 1/4 On argatroban  eICU Interventions  Will transfuse 1 unit packed red blood cells.  Check repeat hemoglobin.  dayrounder > Need to reassess whether argatroban needs to be stopped.      Intervention Category Major Interventions: Hemorrhage - evaluation and management  Rush Landmark 07/16/2016, 5:52 AM

## 2016-07-16 NOTE — Progress Notes (Signed)
PHARMACY - ADULT TOTAL PARENTERAL NUTRITION CONSULT NOTE   Pharmacy Consult:  TPN Indication:  SBO  Patient Measurements: Height: '5\' 8"'$  (172.7 cm) Weight: 204 lb 2.3 oz (92.6 kg) IBW/kg (Calculated) : 68.4 TPN AdjBW (KG): 74.5 Body mass index is 31.04 kg/m.   Assessment:  28 YOM with recent admission on 11/26-12/4/17 for SBO, re-admitted to [facility] on 06/24/16 with PE and recurrent SBO. Patient was unable to tolerate fluids and had continued nausea and vomiting.  NG tube placement failed multiple times and Pharmacy was consulted to manage TPN for nutritional support.  Patient transferred to [facility] on 07/14/15.  Per documentation, his intake was </= 75% for 1 month PTA.  Now with severe malnutrition.  Surgery is delayed because patient was too debilitated.   Spoke with RN about the cycle last night as there was no documentation of rate change last night and overnight. She got no report of difficulty with instructions and the bag was almost empty as expected at the last hr this morning. Otherwise patient tolerated cycle well.   GI: GERD.  Prealbumin 10.7 > 6.5.  Imaging showing resolving SBO.  NG O/P down 527m. Tentative plan for ex-lep on 1/9 if nutrition improves Endo: no hx DM - CBGs improving, 112 off TPN and 95-199 while on TPN (on Decadron '4mg'$  BID).  DDAVP for diabetes insipidus. Insulin requirements in the past 24 hours: 10 units in TPN + 11 units SSI Lytes: K+ 3.4, Na/CL elevated, others WNL *day without lytes: 1/4, 1/5 Renal: BPH.  AKI resolving - SCr down to wnl, BUN up 38 - good UOP 0.9 ml/kg/hr Pulm: reduced on 2L [place] Cards: BP low-low normal - not on med AC: Xarelto PTA for hx PE/DVT > transitioned to argatroban and then Eliquis, bleeding on AC so received IVC filter, then resumed Xarelto and now on argatroban again - hgb down 7, plts 37 Onc: MM / SCC / metastatic lung cancer on immunotherapy with Ketruda (on hold) Hepatobil: AST/ALT improving, alk phos and tbili WNL.  TG 184 >  94. Neuro: DJD - A&O ID: CTX/Flagyl for colitis - now afebrile, WBC WNL Best Practices: argatroban TPN Access: right CVC port placed 10/30/12 TPN start date: 07/09/16  Nutritional Goals: 2150-2350 kCal and 115-125 gm protein per day  Current Nutrition:  TPN   Plan:  - Change cycle to 14 hrs: Clinimix 5/15 (no electrolytes d/t elevated CL) infuse 1992 mls over 14 hours: 50 ml/hr x 1 hr, then 158 ml/hr x 12 hrs, then 50 ml/hr x 1 hr - Lipids at 17 ml/hr x 14 hours (total 240 ml/day) - TPN will provide 1894 kCal and 100 gm of protein per day, meeting 88% of kCal and 87% of protein needs.  Capping TPN at this rate d/t critical Clinimix shortage. - Daily multivitamin and trace elements in TPN - Change moderate SSI to q6h (09:00, 16:00, 20:00, 23:00) and 10 units of regular insulin in TPN - KCL 368m IV x 1 - KPhos 10 mmol IV x1 - F/U AM labs   JeRenold GentaPharmD, BCPS Clinical Pharmacist Phone for today - x2[place] [phone]/12/2016 10:34 AM

## 2016-07-16 NOTE — Progress Notes (Signed)
CRITICAL VALUE ALERT  Critical value received:  Hemoglobin 6.4   Date of notification:  07/16/2016  Time of notification:  0543  Critical value read back:Yes.    Nurse who received alert:  Fara Chute  MD notified (1st page):  PCCM on call  Time of first page:  [phone]  MD notified (2nd page):  Time of second page:  Responding MD:     Time MD responded:

## 2016-07-17 LAB — CBC
HCT: 21.3 % — ABNORMAL LOW (ref 39.0–52.0)
Hemoglobin: 7 g/dL — ABNORMAL LOW (ref 13.0–17.0)
MCH: 31 pg (ref 26.0–34.0)
MCHC: 32.9 g/dL (ref 30.0–36.0)
MCV: 94.2 fL (ref 78.0–100.0)
Platelets: 53 10*3/uL — ABNORMAL LOW (ref 150–400)
RBC: 2.26 MIL/uL — ABNORMAL LOW (ref 4.22–5.81)
RDW: 21.5 % — AB (ref 11.5–15.5)
WBC: 9.9 10*3/uL (ref 4.0–10.5)

## 2016-07-17 LAB — APTT: aPTT: 63 seconds — ABNORMAL HIGH (ref 24–36)

## 2016-07-17 LAB — COMPREHENSIVE METABOLIC PANEL
ALT: 110 U/L — ABNORMAL HIGH (ref 17–63)
AST: 48 U/L — ABNORMAL HIGH (ref 15–41)
Albumin: 1.4 g/dL — ABNORMAL LOW (ref 3.5–5.0)
Alkaline Phosphatase: 71 U/L (ref 38–126)
Anion gap: 8 (ref 5–15)
BUN: 37 mg/dL — ABNORMAL HIGH (ref 6–20)
CHLORIDE: 112 mmol/L — AB (ref 101–111)
CO2: 22 mmol/L (ref 22–32)
Calcium: 7.4 mg/dL — ABNORMAL LOW (ref 8.9–10.3)
Creatinine, Ser: 0.73 mg/dL (ref 0.61–1.24)
GFR calc non Af Amer: >60 mL/min (ref >60)
Glucose, Bld: 209 mg/dL — ABNORMAL HIGH (ref 65–99)
Potassium: 3.4 mmol/L — ABNORMAL LOW (ref 3.5–5.1)
SODIUM: 142 mmol/L (ref 135–145)
Total Bilirubin: <0.1 mg/dL — ABNORMAL LOW (ref 0.3–1.2)
Total Protein: 3.4 g/dL — ABNORMAL LOW (ref 6.5–8.1)

## 2016-07-17 LAB — GLUCOSE, CAPILLARY
Glucose-Capillary: 168 mg/dL — ABNORMAL HIGH (ref 65–99)
Glucose-Capillary: 106 mg/dL — ABNORMAL HIGH (ref 65–99)
Glucose-Capillary: 193 mg/dL — ABNORMAL HIGH (ref 65–99)
Glucose-Capillary: 208 mg/dL — ABNORMAL HIGH (ref 65–99)

## 2016-07-17 LAB — HEMOGLOBIN AND HEMATOCRIT, BLOOD
HCT: 27.6 % — ABNORMAL LOW (ref 39.0–52.0)
Hemoglobin: 9.2 g/dL — ABNORMAL LOW (ref 13.0–17.0)

## 2016-07-17 LAB — PREPARE PLATELET PHERESIS
Blood Product Expiration Date: 201801071217
ISSUE DATE / TIME: 201801061339
Unit Type and Rh: 8400

## 2016-07-17 LAB — PREPARE RBC (CROSSMATCH)

## 2016-07-17 MED ORDER — PHENOL 1.4 % MT LIQD
1.0000 | OROMUCOSAL | Status: DC | PRN
  Administered 2016-07-17: 1 via OROMUCOSAL
  Filled 2016-07-17: qty 177

## 2016-07-17 MED ORDER — TRACE MINERALS CR-CU-MN-SE-ZN 10-1000-500-60 MCG/ML IV SOLN
INTRAVENOUS | Status: AC
  Administered 2016-07-17: 18:00:00 via INTRAVENOUS
  Filled 2016-07-17: qty 1992

## 2016-07-17 MED ORDER — ALTEPLASE 2 MG IJ SOLR
2.0000 mg | Freq: Once | INTRAMUSCULAR | Status: DC
  Filled 2016-07-17: qty 2

## 2016-07-17 MED ORDER — SODIUM CHLORIDE 0.9 % IV SOLN
Freq: Once | INTRAVENOUS | Status: AC
  Administered 2016-07-17: 09:00:00 via INTRAVENOUS

## 2016-07-17 MED ORDER — LIDOCAINE-PRILOCAINE 2.5-2.5 % EX CREA
TOPICAL_CREAM | Freq: Once | CUTANEOUS | Status: AC
  Administered 2016-07-17: 1 via TOPICAL
  Filled 2016-07-17: qty 5

## 2016-07-17 MED ORDER — SODIUM CHLORIDE 0.9% FLUSH
10.0000 mL | INTRAVENOUS | Status: DC | PRN
  Administered 2016-07-21 – 2016-07-23 (×4): 10 mL
  Filled 2016-07-17 (×4): qty 40

## 2016-07-17 MED ORDER — FAT EMULSION 20 % IV EMUL
240.0000 mL | INTRAVENOUS | Status: AC
  Administered 2016-07-17: 240 mL via INTRAVENOUS
  Filled 2016-07-17: qty 250

## 2016-07-17 MED ORDER — SODIUM CHLORIDE 0.9 % IV SOLN
30.0000 meq | Freq: Once | INTRAVENOUS | Status: AC
  Administered 2016-07-17: 30 meq via INTRAVENOUS
  Filled 2016-07-17: qty 15

## 2016-07-17 MED ORDER — FUROSEMIDE 10 MG/ML IJ SOLN
20.0000 mg | Freq: Once | INTRAMUSCULAR | Status: AC
  Administered 2016-07-17: 20 mg via INTRAVENOUS
  Filled 2016-07-17: qty 2

## 2016-07-17 NOTE — Progress Notes (Signed)
PROGRESS NOTE    [name]  ZHY:[id number] DOB: 1940/12/19 DOA: 07/13/2016 PCP: [name], MD    Brief Narrative: 76 year old man with history significant for MM and stage IV NSCLC now with mets to brain discovered 04/2016 undergoing radiotherapy to brain lesions and Ketruda, pulmonary embolism on Xarelto. He has been admitted several times in the past few months, initially for SBO back in October for SBO, which resolved with conservative therapy. He was deemed a poor surgical candidate, but it was felt that if he could regain some strength he could undergo surgery. He was admitted again 12/15 with projectile vomiting and SOB. He was then found to have GNR bacteremia. Course became complicated by shock and DVT while he was off DOAC for colitis related GIB. IVC filter placed. He was started on TPN 1/2 and transferred to CIR 1/3. Later that day he developed vomiting again where and was suspected to have aspirated developing hypoxia and significant respiratory distress.    Assessment & Plan:   Active Problems:   Primary malignant neoplasm of lung metastatic to other site [facility])   Pressure injury of skin   Aspiration into airway   Goals of care, counseling/discussion   Palliative care by specialist   1-Recurrent SBO; NG tube intermittent suctioning.  Continue parenteral nutrition.  Plan for surgery next week, when nutritional status improved.  Albumin still 1.4. Continue with TPN.   Acute hypoxic Respiratory failure, secondary to possible aspiration event; Aspiration pneumonitis versus pneumonia suplemmental oxygen.  On IV ceftriaxone and flagyl.  Blood culture no growth to date.  Chest x ray increasing infiltrate  left lung.. Follow up recommended.  Repeat chest x ray 1-08.  Sat 94 on 4 L, better than yesterday was at 6 L  NSCLC (80% PDL1+)-->treating w/ [name] >Dr. [name]  H/o Multiple Myeloma w/ skull lesion s/p XRT 2016 in remission.  H/o PE and DVT on Eliquis-->new  DVTs on NOAC 12/21 > S/P temp filter 12/1 H/O HIT  On Argatroban.   Thrombocytopenia, Acute blood loss anemia. Now on argatroban.  HIT panel; repeated 1-05; at 0.248 Received one unit PRBC and platelet on 1-06 Appreciate oncology evaluation/  Continue with IV protonix.  Will transfuse one unit PRBC today. Lasix after PRBC.    Chronic diastolic HF; Monitor volume status.  To received another dose of lasix after transfusion.  Lower extremity edema improved after lasix.   Transaminases; trending down.   Hypoalbuminemia;  Still at 1.4 Continuee with TPN.   AKI;  Cr decreased to 1.08 from 1.5. Continue to follow trend.  Hyponatremia; resolved.   Addison diseases; decadron, Desmopressin.   Stage 2 sacral ulcer noted 2x2 cms Wound care consult/   DVT prophylaxis: argatroban.  Code Status: DNR Family Communication: sister at bedside.  Disposition Plan: remain in patient. PT, OT evaluation. Incentive spirometry as possible.    Consultants:   Surgery  Oncology    Procedures: none   Antimicrobials: ceftriaxone and flagyl.    Subjective: He is complaining of throat pain.  He needs surgery. Explain to him that he will be at high risk.  Denies abdominal pain. Not passing significant amount of gas.     Objective: Vitals:   07/16/16 1605 07/16/16 2057 07/16/16 2300 07/17/16 0400  BP: 105/63 108/69 104/63 110/65  Pulse: 81 89 92 80  Resp: (!) 22 18 (!) 21 18  Temp: 98.2 F (36.8 C) 97.6 F (36.4 C) 97.7 F (36.5 C) 98.3 F (36.8 C)  TempSrc: Axillary Oral  Oral Axillary  SpO2: 95% 93% 95% 96%  Weight:      Height:        Intake/Output Summary (Last 24 hours) at 07/17/16 0904 Last data filed at 07/17/16 0400  Gross per 24 hour  Intake          3217.61 ml  Output             3650 ml  Net          -432.39 ml   Filed Weights   07/13/16 2130  Weight: 92.6 kg (204 lb 2.3 oz)    Examination:  General exam: Appears calm and comfortable ng tube in  place. Fluid less dark.  Respiratory system: Clear to auscultation. Respiratory effort normal. Cardiovascular system: S1 & S2 heard, RRR. No JVD, murmurs, rubs, gallops or clicks. No pedal edema. Gastrointestinal system: Abdomen is distended, soft and mild tender.  Central nervous system: Alert and oriented. No focal neurological deficits. Extremities: Symmetric 5 x 5 power. Plus 1 edema.  Skin: ulcers lower extremity      Data Reviewed: I have personally reviewed following labs and imaging studies  CBC:  Recent Labs Lab 07/11/16 0405  07/14/16 0514 07/15/16 0339 07/16/16 0448 07/16/16 1917 07/17/16 0257  WBC 6.8  < > 4.4 6.7 8.1 10.9* 9.9  NEUTROABS 6.4  --  4.0  --   --   --   --   HGB 7.3*  < > 8.2* 7.0* 6.4* 7.7* 7.0*  HCT 22.1*  < > 25.2* 21.4* 20.0* 23.3* 21.3*  MCV 99.5  < > 101.6* 98.6 101.0* 94.7 94.2  PLT 54*  < > 35* 37* 33* 63* 53*  < > = values in this interval not displayed. Basic Metabolic Panel:  Recent Labs Lab 07/11/16 0405 07/12/16 0413 07/13/16 0956 07/14/16 0514 07/15/16 0339 07/16/16 0448 07/17/16 0257  NA 139 136 147* 144 147* 144 142  K 3.3* 3.8 3.4* 4.0 3.4* 3.6 3.4*  CL 107 105 112* 114* 114* 113* 112*  CO2 26 18* '27 24 26 23 22  '$ GLUCOSE 153* 229* 152* 216* 150* 199* 209*  BUN 18 18 23* 38* 37* 38* 37*  CREATININE 0.54* 0.77 0.81 1.56* 1.08 0.82 0.73  CALCIUM 7.5* 7.7* 8.2* 7.9* 7.7* 7.6* 7.4*  MG 2.1 2.0  --  2.1 2.1  --   --   PHOS 1.5* 2.6 3.1 4.4 2.7 2.3*  --    GFR: Estimated Creatinine Clearance: 88.1 mL/min (by C-G formula based on SCr of 0.73 mg/dL). Liver Function Tests:  Recent Labs Lab 07/11/16 0405 07/12/16 0413 07/14/16 0514 07/15/16 0339 07/17/16 0257  AST 16 45* 318* 138* 48*  ALT 14* 27 168* 151* 110*  ALKPHOS 64 97 83 74 71  BILITOT 0.3 0.7 0.5 0.2* <0.1*  PROT 4.0* 5.1* 3.8* 3.8* 3.4*  ALBUMIN 1.8* 2.2* 1.5* 1.4* 1.4*   No results for input(s): LIPASE, AMYLASE in the last 168 hours. No results for  input(s): AMMONIA in the last 168 hours. Coagulation Profile: No results for input(s): INR, PROTIME in the last 168 hours. Cardiac Enzymes: No results for input(s): CKTOTAL, CKMB, CKMBINDEX, TROPONINI in the last 168 hours. BNP (last 3 results) No results for input(s): PROBNP in the last 8760 hours. HbA1C: No results for input(s): HGBA1C in the last 72 hours. CBG:  Recent Labs Lab 07/16/16 0826 07/16/16 1325 07/16/16 1627 07/16/16 2056 07/16/16 2258  GLUCAP 199* 105* 120* 230* 188*   Lipid Profile: No results for input(s):  CHOL, HDL, LDLCALC, TRIG, CHOLHDL, LDLDIRECT in the last 72 hours. Thyroid Function Tests: No results for input(s): TSH, T4TOTAL, FREET4, T3FREE, THYROIDAB in the last 72 hours. Anemia Panel:  Recent Labs  07/16/16 1917  VITAMINB12 551  FOLATE 12.5  FERRITIN 1,489*  TIBC 136*  IRON 81  RETICCTPCT 2.1   Sepsis Labs: No results for input(s): PROCALCITON, LATICACIDVEN in the last 168 hours.  No results found for this or any previous visit (from the past 240 hour(s)).       Radiology Studies: Dg Chest Port 1 View  Result Date: 07/16/2016 CLINICAL DATA:  Respiratory failure EXAM: PORTABLE CHEST 1 VIEW COMPARISON:  07/13/2016 FINDINGS: Cardiac shadow is mildly enlarged. Right chest wall port is again seen and stable. Nasogastric catheter is now noted coiled within the stomach. Inspiratory effort is poor with diffuse increased density throughout the left lung new from the prior exam. No pneumothorax is noted. No sizable effusion is seen. IMPRESSION: Increasing infiltrative density in the left lung. Continued follow-up is recommended. Nasogastric catheter within the stomach. Electronically Signed   By: Inez Catalina M.D.   On: 07/16/2016 08:16        Scheduled Meds: . sodium chloride   Intravenous Once  . sodium chloride   Intravenous Once  . cefTRIAXone (ROCEPHIN)  IV  2 g Intravenous Q24H  . desmopressin  10 mcg Nasal QHS  . dexamethasone  4 mg  Intravenous Q12H  . furosemide  20 mg Intravenous Once  . insulin aspart  0-15 Units Subcutaneous 4 times per day  . mouth rinse  15 mL Mouth Rinse BID  . metronidazole  500 mg Intravenous Q8H  . pantoprazole (PROTONIX) IV  40 mg Intravenous Q12H   Continuous Infusions: . sodium chloride 10 mL/hr at 07/15/16 1900  . argatroban 1 mcg/kg/min (07/17/16 0400)     LOS: 4 days    Time spent: 35 minutes.     Elmarie Shiley, MD [facility] Pager [phone]  If 7PM-7AM, please contact night-coverage [email or web address] Password [email or web address] 07/17/2016, 9:04 AM

## 2016-07-17 NOTE — Progress Notes (Signed)
HEMATOLOGY-ONCOLOGY PROGRESS NOTE  SUBJECTIVE: Does not complain of any abdominal pain, NG tube draining dark colored material Able to answer questions appropriately. He has small bowel obstruction as well as metastatic lung cancer with brain metastases that were radiated.  OBJECTIVE: REVIEW OF SYSTEMS:   Constitutional: Denies fevers, chills or abnormal weight loss Eyes: Denies blurriness of vision Ears, nose, mouth, throat, and face: NG tube in place Respiratory: Denies cough, dyspnea or wheezes Cardiovascular: Denies palpitation, chest discomfort Gastrointestinal:  Not distended Skin: Denies abnormal skin rashes Lymphatics: Denies new lymphadenopathy or easy bruising Neurological:Denies numbness, tingling or new weaknesses Behavioral/Psych: Mood is stable, no new changes  Extremities: No lower extremity edema All other systems were reviewed with the patient and are negative.  PHYSICAL EXAMINATION: ECOG PERFORMANCE STATUS: 3 - Symptomatic, >50% confined to bed  Vitals:   07/17/16 1010 07/17/16 1232  BP: 106/75   Pulse: 85   Resp: 20   Temp: 98.2 F (36.8 C) 98.2 F (36.8 C)   Filed Weights   07/13/16 2130  Weight: 204 lb 2.3 oz (92.6 kg)    GENERAL:alert, no distress and comfortable SKIN: skin color, texture, turgor are normal, no rashes or significant lesions EYES: normal, Conjunctiva are pink and non-injected, sclera clear OROPHARYNX:no exudate, no erythema and lips, buccal mucosa, and tongue normal  NECK: supple, thyroid normal size, non-tender, without nodularity LYMPH:  no palpable lymphadenopathy in the cervical, axillary or inguinal LUNGS: clear to auscultation and percussion with normal breathing effort HEART: regular rate & rhythm and no murmurs and no lower extremity edema ABDOMEN:abdomen soft, non-tender and Not distended Musculoskeletal:no cyanosis of digits and no clubbing  NEURO: alert & oriented x 3 with fluent speech, no focal motor/sensory  deficits  LABORATORY DATA:  I have reviewed the data as listed CMP Latest Ref Rng & Units 07/17/2016 07/16/2016 07/15/2016  Glucose 65 - 99 mg/dL 209(H) 199(H) 150(H)  BUN 6 - 20 mg/dL 37(H) 38(H) 37(H)  Creatinine 0.61 - 1.24 mg/dL 0.73 0.82 1.08  Sodium 135 - 145 mmol/L 142 144 147(H)  Potassium 3.5 - 5.1 mmol/L 3.4(L) 3.6 3.4(L)  Chloride 101 - 111 mmol/L 112(H) 113(H) 114(H)  CO2 22 - 32 mmol/L '22 23 26  '$ Calcium 8.9 - 10.3 mg/dL 7.4(L) 7.6(L) 7.7(L)  Total Protein 6.5 - 8.1 g/dL 3.4(L) - 3.8(L)  Total Bilirubin 0.3 - 1.2 mg/dL <0.1(L) - 0.2(L)  Alkaline Phos 38 - 126 U/L 71 - 74  AST 15 - 41 U/L 48(H) - 138(H)  ALT 17 - 63 U/L 110(H) - 151(H)    Lab Results  Component Value Date   WBC 9.9 07/17/2016   HGB 7.0 (L) 07/17/2016   HCT 21.3 (L) 07/17/2016   MCV 94.2 07/17/2016   PLT 53 (L) 07/17/2016   NEUTROABS 4.0 07/14/2016    ASSESSMENT AND PLAN: 1. Severe anemia: Receiving another unit of blood transfusion today. Hemoglobin is 7 There was no evidence of hemolysis with a peripheral smear and blood work No evidence of iron deficiency anemia. No B-12 or folate deficiency either Reticulocytes are low suggestive of decreased production Continue blood transfusion support 2. thrombocytopenia: Platelets had improved from 33-63 and today it is 53. This can be monitored for now. 3. Metastatic lung cancer: Dr. [name] will follow from tomorrow 4. Small bowel obstruction: Dr. Barry Dienes may consider doing surgery next Tuesday.

## 2016-07-17 NOTE — Progress Notes (Signed)
[facility] Progress Note     Subjective: Intermittent crampy abdominal pain. Minimal flatus that occurs prior to urination. No BM.  NGT output significantly less than yesterday - 20 cc compared to 1400 cc/24 h  Objective: Vital signs in last 24 hours: Temp:  [97.6 F (36.4 C)-98.5 F (36.9 C)] 98.3 F (36.8 C) (01/07 0400) Pulse Rate:  [73-92] 80 (01/07 0400) Resp:  [11-22] 18 (01/07 0400) BP: (104-117)/(63-71) 110/65 (01/07 0400) SpO2:  [92 %-100 %] 96 % (01/07 0400)    Intake/Output from previous day: 01/06 0701 - 01/07 0700 In: 3482.6 [I.V.:1976.3; Blood:718; NG/GT:20; IV Piggyback:768.3] Out: 4100 [Urine:3300; Emesis/NG output:800] Intake/Output this shift: No intake/output data recorded.  PE: Gen:  Alert, NAD, pleasant  Card:  RRR - 95 bpm Pulm: unlabored Abd: Soft, non-tender, mild distention,  Hypoactive BS Ext:  Improved lower extremity edema  Lab Results:   Recent Labs  07/16/16 1917 07/17/16 0257  WBC 10.9* 9.9  HGB 7.7* 7.0*  HCT 23.3* 21.3*  PLT 63* 53*   BMET  Recent Labs  07/16/16 0448 07/17/16 0257  NA 144 142  K 3.6 3.4*  CL 113* 112*  CO2 23 22  GLUCOSE 199* 209*  BUN 38* 37*  CREATININE 0.82 0.73  CALCIUM 7.6* 7.4*   CMP     Component Value Date/Time   NA 142 07/17/2016 0257   NA 132 (L) 06/15/2016 0852   K 3.4 (L) 07/17/2016 0257   K 4.1 06/15/2016 0852   CL 112 (H) 07/17/2016 0257   CL 109 (H) 12/31/2012 0759   CO2 22 07/17/2016 0257   CO2 23 06/15/2016 0852   GLUCOSE 209 (H) 07/17/2016 0257   GLUCOSE 101 06/15/2016 0852   GLUCOSE 116 (H) 12/31/2012 0759   BUN 37 (H) 07/17/2016 0257   BUN 19.0 06/15/2016 0852   CREATININE 0.73 07/17/2016 0257   CREATININE 0.8 06/15/2016 0852   CALCIUM 7.4 (L) 07/17/2016 0257   CALCIUM 8.5 06/15/2016 0852   PROT 3.4 (L) 07/17/2016 0257   PROT 5.6 (L) 06/15/2016 0852   ALBUMIN 1.4 (L) 07/17/2016 0257   ALBUMIN 2.8 (L) 06/15/2016 0852   AST 48 (H) 07/17/2016 0257   AST 10 06/15/2016 0852   ALT 110 (H) 07/17/2016 0257   ALT 27 06/15/2016 0852   ALKPHOS 71 07/17/2016 0257   ALKPHOS 77 06/15/2016 0852   BILITOT <0.1 (L) 07/17/2016 0257   BILITOT 0.99 06/15/2016 0852   GFRNONAA >60 07/17/2016 0257   GFRAA >60 07/17/2016 0257   Lipase     Component Value Date/Time   LIPASE 21 06/24/2016 1622   Studies/Results: Dg Chest Port 1 View  Result Date: 07/16/2016 CLINICAL DATA:  Respiratory failure EXAM: PORTABLE CHEST 1 VIEW COMPARISON:  07/13/2016 FINDINGS: Cardiac shadow is mildly enlarged. Right chest wall port is again seen and stable. Nasogastric catheter is now noted coiled within the stomach. Inspiratory effort is poor with diffuse increased density throughout the left lung new from the prior exam. No pneumothorax is noted. No sizable effusion is seen. IMPRESSION: Increasing infiltrative density in the left lung. Continued follow-up is recommended. Nasogastric catheter within the stomach. Electronically Signed   By: Inez Catalina M.D.   On: 07/16/2016 08:16    Anti-infectives: Anti-infectives    Start     Dose/Rate Route Frequency Ordered Stop   07/14/16 2200  cefTRIAXone (ROCEPHIN) 2 g in dextrose 5 % 50 mL IVPB     2 g 100 mL/hr over 30 Minutes Intravenous Every  24 hours 07/14/16 0915     07/13/16 2200  cefTRIAXone (ROCEPHIN) 1 g in dextrose 5 % 50 mL IVPB  Status:  Discontinued     1 g 100 mL/hr over 30 Minutes Intravenous Every 24 hours 07/13/16 2132 07/14/16 0915   07/13/16 2200  metroNIDAZOLE (FLAGYL) IVPB 500 mg     500 mg 100 mL/hr over 60 Minutes Intravenous Every 8 hours 07/13/16 2132       Assessment/Plan MNO:[id number] in the setting of stage IV lung cancer, bilateral DVTs, Hx of PE on Xarelto - No evidence of carcinomatosis, PMH appendectomy - NGT 20 cc/24 h - SB protocol: abd film shows improvement in SB pattern - less dilated loops of SB - continue cyclic TPN to optimize nutrition propr to surgery, check prealbumin Monday  (prealbumin on 1/4 was 6.5) - Dr. Barry Dienes is tentatively planning to perform exploratory laparotomy on Tuesday 07/19/16.  Continue to hold Xarelto (last dose 07/12/16 @ 1710) pending possible surgery, NPO, NG tube.   Acute hypoxic respiratory failure secondary to possible aspiration event - supplemental O2, Ceftriaxone 1/3>> Thrombocytopenia - worsening, platelets 53 today NSCLC - being treated with Keytruda, Dr. [name] Hx PE on Eliquis- held  Stage 2 sacral ulcer  Dispo: continue to optimize nutrition, continue NGT     LOS: 4 days    Jill Alexanders , [facility] Surgery 07/17/2016, 9:01 AM Pager: [phone] Consults: [phone] Mon-Fri 7:00 am-4:30 pm Sat-Sun 7:00 am-11:30 am

## 2016-07-17 NOTE — Progress Notes (Signed)
PHARMACY - ADULT TOTAL PARENTERAL NUTRITION CONSULT NOTE   Pharmacy Consult:  TPN Indication:  SBO  Patient Measurements: Height: '5\' 8"'$  (172.7 cm) Weight: 204 lb 2.3 oz (92.6 kg) IBW/kg (Calculated) : 68.4 TPN AdjBW (KG): 74.5 Body mass index is 31.04 kg/m.   Assessment:  23 YOM with recent admission on 11/26-12/4/17 for SBO, re-admitted to [facility] on 06/24/16 with PE and recurrent SBO. Patient was unable to tolerate fluids and had continued nausea and vomiting.  NG tube placement failed multiple times and Pharmacy was consulted to manage TPN for nutritional support.  Patient transferred to [facility] on 07/14/15.  Per documentation, his intake was </= 75% for 1 month PTA.  Now with severe malnutrition.  Surgery is delayed because patient was too debilitated.    GI: GERD.  Prealbumin 10.7 > 6.5.  Imaging showing resolving SBO.  NG O/P down 554m. Tentative plan for ex-lep on 1/9 if nutrition improves Endo: no hx DM - CBGs mostly >200 while TPN infusing (on Decadron '4mg'$  BID).  DDAVP for diabetes insipidus. Insulin requirements in the past 24 hours: 10 units in TPN + 11 units SSI Lytes: K+ 3.4, others WNL *day without lytes: 1/4, 1/5, 1/6 Renal: BPH.  AKI resolving - SCr down to wnl, BUN 37 - good UOP 1.5 ml/kg/hr Pulm: 4L [place] Cards: BP low-low normal - not on med AC: Xarelto PTA for hx PE/DVT > transitioned to argatroban and then Eliquis, bleeding on AC so received IVC filter, then resumed Xarelto and now on argatroban again - hgb down 7, plts 37 Onc: MM / SCC / metastatic lung cancer on immunotherapy with Ketruda (on hold) Hepatobil: AST/ALT improving, alk phos and tbili WNL.  TG 184 > 94. Neuro: DJD - A&O ID: CTX/Flagyl for colitis - now afebrile, WBC WNL Best Practices: argatroban TPN Access: right CVC port placed 10/30/12 TPN start date: 07/09/16  Nutritional Goals: 2150-2350 kCal and 115-125 gm protein per day  Current Nutrition:  TPN   Plan:  - Continue cycle at 14 hrs: Clinimix  E 5/15 (with lytes) infuse 1992 mls over 14 hours: 50 ml/hr x 1 hr, then 158 ml/hr x 12 hrs, then 50 ml/hr x 1 hr - Lipids at 17 ml/hr x 14 hours (total 240 ml/day) - TPN will provide 1894 kCal and 100 gm of protein per day, meeting 88% of kCal and 87% of protein needs.  Capping TPN at this rate d/t critical Clinimix shortage. - Daily multivitamin and trace elements in TPN - Ccontinue moderate SSI to q6h (09:00, 16:00, 20:00, 23:00) and increase to 15 units of regular insulin in TPN - KCL 350m IV x 1 - F/U AM labs   JeRenold GentaPharmD, BCPS Clinical Pharmacist Phone for today - x2[place] [phone]/01/2017 9:03 AM

## 2016-07-17 NOTE — Progress Notes (Signed)
ANTICOAGULATION CONSULT NOTE - Follow Up Consult  Pharmacy Consult for Argatroban Indication: history of VTE  Allergies  Allergen Reactions  . Tramadol Palpitations    "Made my chest feel tight"  . Amoxicillin     REACTION: rash  . Penicillins Rash    Has patient had a PCN reaction causing immediate rash, facial/tongue/throat swelling, SOB or lightheadedness with hypotension: unknown Has patient had a PCN reaction causing severe rash involving mucus membranes or skin necrosis: unknown Has patient had a PCN reaction that required hospitalization: no  Has patient had a PCN reaction occurring within the last 10 years: no  If all of the above answers are "NO", then may proceed with Cephalosporin use.     Patient Measurements: Height: '5\' 8"'$  (172.7 cm) Weight: 204 lb 2.3 oz (92.6 kg) IBW/kg (Calculated) : 68.4  Vital Signs: Temp: 98.3 F (36.8 C) (01/07 0400) Temp Source: Axillary (01/07 0400) BP: 110/65 (01/07 0400) Pulse Rate: 80 (01/07 0400)  Labs:  Recent Labs  07/15/16 0339 07/16/16 0448 07/16/16 1917 07/17/16 0257  HGB 7.0* 6.4* 7.7* 7.0*  HCT 21.4* 20.0* 23.3* 21.3*  PLT 37* 33* 63* 53*  APTT 80* 70*  --  63*  CREATININE 1.08 0.82  --  0.73    Estimated Creatinine Clearance: 88.1 mL/min (by C-G formula based on SCr of 0.73 mg/dL).   Medications:  Scheduled:  . sodium chloride   Intravenous Once  . cefTRIAXone (ROCEPHIN)  IV  2 g Intravenous Q24H  . desmopressin  10 mcg Nasal QHS  . dexamethasone  4 mg Intravenous Q12H  . insulin aspart  0-15 Units Subcutaneous 4 times per day  . mouth rinse  15 mL Mouth Rinse BID  . metronidazole  500 mg Intravenous Q8H  . pantoprazole (PROTONIX) IV  40 mg Intravenous Q12H    Assessment: 76 y/o M with metastatic NSCLC, was on argatroban in past due to concern for HIT, SRA was negative last month, hospitalist and heme/onc prefers to cont with argatroban for now.   Pltc is low but stable since Argatroban resumed, aPTT  is therapeutic .  Hgb improved today after 1 unit of PRBC  and 1 unit of platelets yesterday. Heme/Onc working up anemia and thrombocytopenia.   Goal of Therapy:  APTT 50-90 sec Monitor platelets by anticoagulation protocol: Yes   Plan:  Continue Argatroban 108mg/kg/min Daily CBC and aPTT Watch for s/s of bleeding Follow-up work-ups for anemia and thrombocytopenia.  EUvaldo Bristle PharmD PGY1 Pharmacy Resident  Pager: [phone]

## 2016-07-18 ENCOUNTER — Inpatient Hospital Stay (HOSPITAL_COMMUNITY): Payer: Medicare Other

## 2016-07-18 LAB — APTT: APTT: 61 s — AB (ref 24–36)

## 2016-07-18 LAB — DIFFERENTIAL
BASOS ABS: 0 10*3/uL (ref 0.0–0.1)
Basophils Relative: 0 %
EOS ABS: 0 10*3/uL (ref 0.0–0.7)
Eosinophils Relative: 0 %
LYMPHS PCT: 2 %
Lymphs Abs: 0.2 10*3/uL — ABNORMAL LOW (ref 0.7–4.0)
MONO ABS: 0.4 10*3/uL (ref 0.1–1.0)
Monocytes Relative: 4 %
Neutro Abs: 8.7 10*3/uL — ABNORMAL HIGH (ref 1.7–7.7)
Neutrophils Relative %: 94 %

## 2016-07-18 LAB — COMPREHENSIVE METABOLIC PANEL
ALBUMIN: 1.4 g/dL — AB (ref 3.5–5.0)
ALK PHOS: 74 U/L (ref 38–126)
ALT: 82 U/L — ABNORMAL HIGH (ref 17–63)
ANION GAP: 6 (ref 5–15)
AST: 32 U/L (ref 15–41)
BUN: 36 mg/dL — ABNORMAL HIGH (ref 6–20)
CHLORIDE: 112 mmol/L — AB (ref 101–111)
CO2: 24 mmol/L (ref 22–32)
Calcium: 7.6 mg/dL — ABNORMAL LOW (ref 8.9–10.3)
Creatinine, Ser: 0.66 mg/dL (ref 0.61–1.24)
GFR calc non Af Amer: >60 mL/min (ref >60)
GLUCOSE: 186 mg/dL — AB (ref 65–99)
POTASSIUM: 3.5 mmol/L (ref 3.5–5.1)
SODIUM: 142 mmol/L (ref 135–145)
Total Bilirubin: 0.6 mg/dL (ref 0.3–1.2)
Total Protein: 3.3 g/dL — ABNORMAL LOW (ref 6.5–8.1)

## 2016-07-18 LAB — PHOSPHORUS: PHOSPHORUS: 2.8 mg/dL (ref 2.5–4.6)

## 2016-07-18 LAB — TRIGLYCERIDES: Triglycerides: 58 mg/dL (ref <150)

## 2016-07-18 LAB — GLUCOSE, CAPILLARY
GLUCOSE-CAPILLARY: 125 mg/dL — AB (ref 65–99)
GLUCOSE-CAPILLARY: 125 mg/dL — AB (ref 65–99)
Glucose-Capillary: 125 mg/dL — ABNORMAL HIGH (ref 65–99)

## 2016-07-18 LAB — HAPTOGLOBIN: HAPTOGLOBIN: 267 mg/dL — AB (ref 34–200)

## 2016-07-18 LAB — TYPE AND SCREEN
BLOOD PRODUCT EXPIRATION DATE: 201801162359
BLOOD PRODUCT EXPIRATION DATE: 201801212359
ISSUE DATE / TIME: 201801061024
ISSUE DATE / TIME: 201801070932
UNIT TYPE AND RH: 9500
Unit Type and Rh: 9500

## 2016-07-18 LAB — CBC
HCT: 24.2 % — ABNORMAL LOW (ref 39.0–52.0)
HEMOGLOBIN: 8.1 g/dL — AB (ref 13.0–17.0)
MCH: 30.6 pg (ref 26.0–34.0)
MCHC: 33.5 g/dL (ref 30.0–36.0)
MCV: 91.3 fL (ref 78.0–100.0)
PLATELETS: 38 10*3/uL — AB (ref 150–400)
RBC: 2.65 MIL/uL — AB (ref 4.22–5.81)
RDW: 22.7 % — ABNORMAL HIGH (ref 11.5–15.5)
WBC: 9.3 10*3/uL (ref 4.0–10.5)

## 2016-07-18 LAB — MAGNESIUM: Magnesium: 1.7 mg/dL (ref 1.7–2.4)

## 2016-07-18 LAB — PREALBUMIN: PREALBUMIN: 18.5 mg/dL (ref 18–38)

## 2016-07-18 MED ORDER — DEXTROSE 5 % IV SOLN
1.0000 g | INTRAVENOUS | Status: AC
  Administered 2016-07-19: 1 g via INTRAVENOUS
  Filled 2016-07-18 (×2): qty 1

## 2016-07-18 MED ORDER — FAT EMULSION 20 % IV EMUL
240.0000 mL | INTRAVENOUS | Status: AC
  Administered 2016-07-18: 240 mL via INTRAVENOUS
  Filled 2016-07-18: qty 250

## 2016-07-18 MED ORDER — FUROSEMIDE 10 MG/ML IJ SOLN
20.0000 mg | Freq: Once | INTRAMUSCULAR | Status: AC
  Administered 2016-07-18: 20 mg via INTRAVENOUS
  Filled 2016-07-18: qty 2

## 2016-07-18 MED ORDER — SALINE SPRAY 0.65 % NA SOLN
1.0000 | NASAL | Status: DC | PRN
  Administered 2016-07-18: 1 via NASAL
  Filled 2016-07-18: qty 44

## 2016-07-18 MED ORDER — MAGNESIUM SULFATE 2 GM/50ML IV SOLN
2.0000 g | Freq: Once | INTRAVENOUS | Status: AC
  Administered 2016-07-18: 2 g via INTRAVENOUS
  Filled 2016-07-18: qty 50

## 2016-07-18 MED ORDER — M.V.I. ADULT IV INJ
INJECTION | INTRAVENOUS | Status: AC
  Administered 2016-07-18: 18:00:00 via INTRAVENOUS
  Filled 2016-07-18 (×2): qty 1992

## 2016-07-18 MED ORDER — SODIUM CHLORIDE 0.9 % IV SOLN
30.0000 meq | Freq: Once | INTRAVENOUS | Status: AC
  Administered 2016-07-18: 30 meq via INTRAVENOUS
  Filled 2016-07-18 (×2): qty 15

## 2016-07-18 NOTE — Evaluation (Signed)
Occupational Therapy Evaluation Patient Details Name: [name] MRN: [id number] DOB: 12-13-1940 Today's Date: 07/18/2016    History of Present Illness Patient is a 76 y/o male with hx of recently diagnosed stage IV NSCLC (80% PDL1+) with brain mets, DVT and PEs s/p IVC filter,recent SBO, DM presents with recurrent SBO. Transferred to CIR on 1/3, but developed vomiting again and respiratory distress requiring nonrebreather and transferred back to acute care. NG tube placed due to SBO.   Clinical Impression   Pt is independent at his baseline. Presents with generalized weakness, decreased activity tolerance and poor standing balance. Pt experiencing symptomatic hypotension with positional changes requiring return to supine. RN made aware. Pt has excellent family support and motivation for inpatient rehab. Will follow acutely.    Follow Up Recommendations  CIR    Equipment Recommendations  3 in 1 bedside commode    Recommendations for Other Services       Precautions / Restrictions Precautions Precautions: Fall Precaution Comments: monitor BP, hypotensive Restrictions Weight Bearing Restrictions: No      Mobility Bed Mobility Overal bed mobility: Needs Assistance Bed Mobility: Supine to Sit     Supine to sit: Min guard;+2 for safety/equipment;HOB elevated Sit to supine: Mod assist   General bed mobility comments: Dizziness sitting EOB. Increased time and effort with useo f rail. Assist for bil LEs to return to supine due to dizziness.  Transfers Overall transfer level: Needs assistance Equipment used: Rolling walker (2 wheeled) Transfers: Sit to/from Stand Sit to Stand: Min assist;+2 safety/equipment;+2 physical assistance;From elevated surface         General transfer comment: Assist to power to standing with cues for hand placement/technique. + dizziness.    Balance Overall balance assessment: Needs assistance Sitting-balance support: Feet supported;Single  extremity supported Sitting balance-Leahy Scale: Fair Sitting balance - Comments: Posterior lean with prolonged sitting EOB due to back pain/dizziness.   Standing balance support: During functional activity;Bilateral upper extremity supported Standing balance-Leahy Scale: Poor Standing balance comment: Reliant on BUEs for support in standing. Able to stand ~2 minutes than needed to sit due to dizziness.                            ADL Overall ADL's : Needs assistance/impaired Eating/Feeding: NPO   Grooming: Supervision/safety;Sitting;Wash/dry hands;Wash/dry face   Upper Body Bathing: Minimal assistance;Sitting   Lower Body Bathing: Maximal assistance;Sit to/from stand   Upper Body Dressing : Minimal assistance;Sitting   Lower Body Dressing: Maximal assistance;Sit to/from stand               Functional mobility during ADLs: Rolling walker;Cueing for sequencing (stood x 2 minutes) General ADL Comments: Poor activity tolerance. Pt with hypotension limiting OOB mobility.     Vision     Perception     Praxis      Pertinent Vitals/Pain Pain Assessment: No/denies pain     Hand Dominance Right   Extremity/Trunk Assessment Upper Extremity Assessment Upper Extremity Assessment: Generalized weakness LUE Deficits / Details: full AROM, L UE weaker than R   Lower Extremity Assessment Lower Extremity Assessment: Defer to PT evaluation       Communication Communication Communication: No difficulties   Cognition Arousal/Alertness: Awake/alert Behavior During Therapy: WFL for tasks assessed/performed Overall Cognitive Status: Within Functional Limits for tasks assessed                     General Comments  Exercises       Shoulder Instructions      Home Living Family/patient expects to be discharged to:: Private residence Living Arrangements: Spouse/significant other Available Help at Discharge: Family Type of Home: House Home Access:  Stairs to enter Technical brewer of Steps: 3 Entrance Stairs-Rails: None Home Layout: One level     Bathroom Shower/Tub: Occupational psychologist: [place]: Bedside commode;Walker - 2 wheels;Shower seat - built in          Prior Functioning/Environment Level of Independence: Independent        Comments: Pt walked an hour a day until recently.  Noted decline over past several weeks        OT Problem List: Decreased strength;Decreased activity tolerance;Impaired UE functional use;Decreased safety awareness;Impaired balance (sitting and/or standing);Decreased cognition;Cardiopulmonary status limiting activity;Decreased knowledge of use of DME or AE   OT Treatment/Interventions: Self-care/ADL training;DME and/or AE instruction;Patient/family education;Energy conservation;Therapeutic activities    OT Goals(Current goals can be found in the care plan section) Acute Rehab OT Goals Patient Stated Goal: to go to rehab OT Goal Formulation: With patient Time For Goal Achievement: 08/01/16 Potential to Achieve Goals: Good ADL Goals Pt Will Perform Grooming: with min guard assist;standing Pt Will Perform Lower Body Bathing: with min assist;sit to/from stand Pt Will Perform Lower Body Dressing: with min assist;sit to/from stand Pt Will Transfer to Toilet: with min assist;ambulating;bedside commode (over toilet) Pt Will Perform Toileting - Clothing Manipulation and hygiene: with min assist;sit to/from stand Pt/caregiver will Perform Home Exercise Program: Increased strength;Both right and left upper extremity;With theraband;Independently Additional ADL Goal #1: Pt will utilize energy conservation strategies in ADL independently.  OT Frequency: Min 2X/week   Barriers to D/C:            Co-evaluation PT/OT/SLP Co-Evaluation/Treatment: Yes Reason for Co-Treatment: For patient/therapist safety PT goals addressed during session: Mobility/safety with  mobility OT goals addressed during session: ADL's and self-care      End of Session Equipment Utilized During Treatment: Gait belt;Rolling walker;Oxygen Nurse Communication: Mobility status (low BP)  Activity Tolerance: Treatment limited secondary to medical complications (Comment) (hypotension) Patient left: in bed;with call bell/phone within reach;with family/visitor present   Time: 1039-1110 OT Time Calculation (min): 31 min Charges:  OT General Charges $OT Visit: 1 Procedure OT Evaluation $OT Eval Moderate Complexity: 1 Procedure G-Codes:    Malka So 07/18/2016, 2:13 PM  [phone]

## 2016-07-18 NOTE — Progress Notes (Signed)
PHARMACY - ADULT TOTAL PARENTERAL NUTRITION CONSULT NOTE   Pharmacy Consult:  TPN Indication:  SBO  Patient Measurements: Height: '5\' 8"'$  (172.7 cm) Weight: 204 lb 2.3 oz (92.6 kg) IBW/kg (Calculated) : 68.4 TPN AdjBW (KG): 74.5 Body mass index is 31.04 kg/m.   Assessment:  27 YOM with recent admission on 11/26-12/4/17 for SBO, re-admitted to [facility] on 06/24/16 with PE and recurrent SBO. Patient was unable to tolerate fluids and had continued nausea and vomiting.  NG tube placement failed multiple times and Pharmacy was consulted to manage TPN for nutritional support.  Patient transferred to [facility] on 07/14/15.  Per documentation, his intake was </= 75% for 1 month PTA.  Now with severe malnutrition.  Surgery is delayed because patient was too debilitated.   HY:IFOYDXAJOI 10.7 > 6.5> 18.5 -now WNL.  NG O/P down 250 mL. Tentative plan for ex-lap on 1/9 on hold. Per CCS: passing flatus, no longer feels distended. No BM yesterday. Plain films improved. CCS plans to clamp NG tube today, if he has BM and remains non-distended, consider holding off surgery.  Endo: no hx DM - CBGs 193 and 208  while TPN infusing (on Decadron '4mg'$  BID).  DDAVP for diabetes insipidus. Insulin requirements in the past 24 hours: 15 units in TPN + 11 units SSI Lytes: K+ 3.5 after 30 K yesterday. Mag 1.7, phos 2.8  *day without lytes: 1/4, 1/5, 1/6 Renal: BPH.  AKI resolved - SCr down to wnl, BUN 36, lasix x1 today for R infiltrate on CXR Pulm: 4L [place] Cards:  not on med AC: Xarelto PTA for hx PE/DVT > transitioned to argatroban and then Eliquis, bleeding on AC so received IVC filter, then resumed Xarelto and now on argatroban again - hgb 8.1, plts 38 Onc: MM / SCC / metastatic lung cancer on immunotherapy with Ketruda (on hold) Hepatobil: AST/ALT improving, alk phos and tbili WNL.  TG WNL Neuro: DJD - A&O ID: CTX/Flagyl for colitis - now afebrile, WBC WNL Best Practices: argatroban TPN Access: right CVC port placed  10/30/12 TPN start date: 07/09/16  Nutritional Goals: 2150-2350 kCal and 115-125 gm protein per day  Current Nutrition:  TPN  Plan:  - Continue cycle at 14 hrs: Clinimix E 5/15 (with lytes) infuse 1992 mls over 14 hours: 50 ml/hr x 1 hr, then 158 ml/hr x 12 hrs, then 50 ml/hr x 1 hr - Lipids at 17 ml/hr x 14 hours (total 240 ml/day) - TPN will provide 1894 kCal and 100 gm of protein per day, meeting 88% of kCal and 87% of protein needs.  Capping TPN at this rate d/t critical Clinimix shortage. - Daily multivitamin and trace elements in TPN - Ccontinue moderate SSI to q6h (09:00, 16:00, 20:00, 23:00) and increase to 20 units of regular insulin in TPN - 2 gm mag, followed by KCL 55mq IV x 1 - F/U AM labs  MEudelia Bunch Pharm.D. [phone]/02/2017 10:52 AM

## 2016-07-18 NOTE — Progress Notes (Signed)
Rehab Admissions Coordinator Note:  Patient was screened by [name] for appropriateness for an Inpatient Acute Rehab Consult.  Patient known to be from previous brief inpatient rehab admission.  Noted possible surgery tomorrow.  I will follow along for progress.  Call me for questions.  [name] 07/18/2016, 12:58 PM  I can be reached at [phone].

## 2016-07-18 NOTE — Evaluation (Signed)
Physical Therapy Evaluation Patient Details Name: [name] MRN: [id number] DOB: 12-Feb-1941 Today's Date: 07/18/2016   History of Present Illness  Patient is a 76 y/o male with hx of recently diagnosed stage IV NSCLC (80% PDL1+) with brain mets, DVT and PEs s/p IVC filter,recent SBO, DM presents with recurrent SBO. Transferred to CIR on 1/3, but developed vomiting again and respiratory distress requiring nonrebreather and transferred back to acute care. NG tube placed due to SBO.  Clinical Impression  Patient presents with generalized weakness, dyspnea on exertion and hypotension s/p above impacting mobility. Hypotension limited mobility and transfer to chair today.  Supine BP 89/75 Sitting BP 80/67 Standing BP 50/34 Sitting BP 69/38 Supine BP 85/68 Pt symptomatic with all changes in position and worsened with standing. RN notified of BP changes. Encouraged elevating HOB throughout the day to acclimate BP. Pt highly motivated to return to PLOF and get to rehab. Pt independent PTA. Would benefit from CIR to maximize independence and mobility prior to return home. Will follow acutely.     Follow Up Recommendations CIR    Equipment Recommendations  None recommended by PT    Recommendations for Other Services OT consult     Precautions / Restrictions Precautions Precautions: Fall Precaution Comments: monitor BP, hypotensive Restrictions Weight Bearing Restrictions: No      Mobility  Bed Mobility Overal bed mobility: Needs Assistance Bed Mobility: Supine to Sit     Supine to sit: Min guard;+2 for safety/equipment;HOB elevated Sit to supine: Mod assist   General bed mobility comments: Dizziness sitting EOB. Increased time and effort with useo f rail. Assist for bil LEs to return to supine due to dizziness.  Transfers Overall transfer level: Needs assistance Equipment used: Rolling walker (2 wheeled) Transfers: Sit to/from Stand Sit to Stand: Min assist;+2  safety/equipment;+2 physical assistance;From elevated surface         General transfer comment: Assist to power to standing with cues for hand placement/technique. + dizziness.  Ambulation/Gait Ambulation/Gait assistance:  (deferred secondary to hypotension)              Stairs            Wheelchair Mobility    Modified Rankin (Stroke Patients Only)       Balance Overall balance assessment: Needs assistance Sitting-balance support: Feet supported;Single extremity supported Sitting balance-Leahy Scale: Fair Sitting balance - Comments: Posterior lean with prolonged sitting EOB due to back pain/dizziness.   Standing balance support: During functional activity;Bilateral upper extremity supported Standing balance-Leahy Scale: Poor Standing balance comment: Reliant on BUEs for support in standing. Able to stand ~2 minutes than needed to sit due to dizziness.                             Pertinent Vitals/Pain Pain Assessment: No/denies pain    Home Living Family/patient expects to be discharged to:: Private residence Living Arrangements: Spouse/significant other Available Help at Discharge: Family Type of Home: House Home Access: Stairs to enter Entrance Stairs-Rails: None Entrance Stairs-Number of Steps: 3 Home Layout: One level Home Equipment: Bedside commode;Walker - 2 wheels;Shower seat - built in      Prior Function Level of Independence: Independent         Comments: Pt walked an hour a day until recently.  Noted decline over past several weeks     Hand Dominance        Extremity/Trunk Assessment   Upper Extremity Assessment Upper  Extremity Assessment: Defer to OT evaluation    Lower Extremity Assessment Lower Extremity Assessment: Generalized weakness       Communication   Communication: No difficulties  Cognition Arousal/Alertness: Awake/alert Behavior During Therapy: WFL for tasks assessed/performed Overall Cognitive  Status: Within Functional Limits for tasks assessed                      General Comments General comments (skin integrity, edema, etc.): HR up to 125 bpm and SP02 dropped to 84% on 4L/min 02.     Exercises General Exercises - Lower Extremity Ankle Circles/Pumps: AROM;Both;20 reps;Seated   Assessment/Plan    PT Assessment Patient needs continued PT services  PT Problem List Decreased strength;Decreased activity tolerance;Decreased balance;Decreased mobility;Decreased cognition;Decreased knowledge of use of DME;Pain;Decreased safety awareness;Cardiopulmonary status limiting activity;Decreased skin integrity          PT Treatment Interventions DME instruction;Gait training;Stair training;Functional mobility training;Therapeutic activities;Therapeutic exercise;Patient/family education;Balance training    PT Goals (Current goals can be found in the Care Plan section)  Acute Rehab PT Goals Patient Stated Goal: to go to rehab PT Goal Formulation: With patient Time For Goal Achievement: 08/01/16 Potential to Achieve Goals: Good    Frequency Min 3X/week   Barriers to discharge        Co-evaluation PT/OT/SLP Co-Evaluation/Treatment: Yes Reason for Co-Treatment: For patient/therapist safety;Complexity of the patient's impairments (multi-system involvement);To address functional/ADL transfers PT goals addressed during session: Mobility/safety with mobility         End of Session Equipment Utilized During Treatment: Gait belt Activity Tolerance: Treatment limited secondary to medical complications (Comment) (hypotension) Patient left: in bed;with call bell/phone within reach;with family/visitor present Nurse Communication: Mobility status         Time: 1040-1110 PT Time Calculation (min) (ACUTE ONLY): 30 min   Charges:   PT Evaluation $PT Eval Moderate Complexity: 1 Procedure     PT G Codes:        Savahna Casados A Doloris Servantes 07/18/2016, 12:41 PM  Wray Kearns,  [place], DPT [phone]

## 2016-07-18 NOTE — Care Management Important Message (Signed)
Important Message  Patient Details  Name: [name] MRN: [id number] Date of Birth: 1941-02-20   Medicare Important Message Given:  Yes    Nathen May 07/18/2016, 12:59 PM

## 2016-07-18 NOTE — Progress Notes (Signed)
Subjective: Patient is passing flatus, no longer feels distended;  No BM yesterday NG output much decreased - only 250 cc yesterday Plain films improved  Objective: Vital signs in last 24 hours: Temp:  [97 F (36.1 C)-98.4 F (36.9 C)] 97.8 F (36.6 C) (01/08 0839) Pulse Rate:  [81-89] 87 (01/08 0839) Resp:  [15-21] 21 (01/08 0839) BP: (93-104)/(62-72) 93/67 (01/08 0839) SpO2:  [92 %-97 %] 96 % (01/08 0839) Last BM Date: 07/13/16  Intake/Output from previous day: 01/07 0701 - 01/08 0700 In: 3551 [P.O.:60; I.V.:2451; Blood:335; NG/GT:90; IV Piggyback:615] Out: 3335 [Urine:3085; Emesis/NG output:250] Intake/Output this shift: Total I/O In: 25.6 [I.V.:25.6] Out: -   General appearance: alert, cooperative and no distress GI: soft, non-tender; non-distended; active bowel sounds  Lab Results:   Recent Labs  07/17/16 0257 07/17/16 1834 07/18/16 0656  WBC 9.9  --  9.3  HGB 7.0* 9.2* 8.1*  HCT 21.3* 27.6* 24.2*  PLT 53*  --  38*   BMET  Recent Labs  07/17/16 0257 07/18/16 0656  NA 142 142  K 3.4* 3.5  CL 112* 112*  CO2 22 24  GLUCOSE 209* 186*  BUN 37* 36*  CREATININE 0.73 0.66  CALCIUM 7.4* 7.6*   Prealbumin 18.3  PT/INR No results for input(s): LABPROT, INR in the last 72 hours. ABG No results for input(s): PHART, HCO3 in the last 72 hours.  Invalid input(s): PCO2, PO2  Studies/Results: Dg Chest Port 1 View  Result Date: 07/18/2016 CLINICAL DATA:  Pneumonia EXAM: PORTABLE CHEST 1 VIEW COMPARISON:  07/16/2016 FINDINGS: Nasogastric tube enters the stomach. Power port unchanged with its tip at the SVC RA junction. There is less pulmonary density on the left and slightly worsened pulmonary density on the right. This rapid change suggests possible edema. No lobar collapse or effusion. IMPRESSION: Left lung looks much better today. Slightly worsened density now on the right. These rapid changes are more typically seen with edema rather than bronchopneumonia.  Electronically Signed   By: Nelson Chimes M.D.   On: 07/18/2016 07:48   Dg Abd Portable 1v  Result Date: 07/18/2016 CLINICAL DATA:  Small-bowel obstruction. EXAM: PORTABLE ABDOMEN - 1 VIEW COMPARISON:  07/15/2016. FINDINGS: NG tube noted coiled in stomach. IVC filter noted in stable position. Continued improvement of small bowel distention. No free air. Pelvic calcifications consistent phleboliths. Degenerative changes scoliosis lumbar spine. Degenerative changes both hips. IMPRESSION: NG tube noted coiled stomach. Continued interim improvement of small bowel distention Electronically Signed   By: Marcello Moores  Register   On: 07/18/2016 07:47    Anti-infectives: Anti-infectives    Start     Dose/Rate Route Frequency Ordered Stop   07/14/16 2200  cefTRIAXone (ROCEPHIN) 2 g in dextrose 5 % 50 mL IVPB     2 g 100 mL/hr over 30 Minutes Intravenous Every 24 hours 07/14/16 0915     07/13/16 2200  cefTRIAXone (ROCEPHIN) 1 g in dextrose 5 % 50 mL IVPB  Status:  Discontinued     1 g 100 mL/hr over 30 Minutes Intravenous Every 24 hours 07/13/16 2132 07/14/16 0915   07/13/16 2200  metroNIDAZOLE (FLAGYL) IVPB 500 mg     500 mg 100 mL/hr over 60 Minutes Intravenous Every 8 hours 07/13/16 2132        Assessment/Plan: [id number] in the setting of stage IV lung cancer, bilateral DVTs, Hx of PE on Xarelto - No evidence of carcinomatosis, PMH appendectomy - NGT 20 cc/24 h - SB protocol: abd film shows improvement in SB  pattern - less dilated loops of SB - continue cyclic TPN to optimize nutrition prior to surgery, - clamp NG tube today; if patient has BM and remains non-distended, consider holding off surgery  - Dr. Barry Dienes is tentatively planning to perform exploratory laparotomy on Tuesday 07/19/16.  Continue to hold Xarelto (last dose 07/12/16 @ 1710) pending possible surgery, NPO, NG tube.   Acute hypoxic respiratory failure secondary to possible aspiration event- supplemental O2, Ceftriaxone  1/3>> Thrombocytopenia - worsening, platelets 53 today NSCLC- being treated with Keytruda, Dr. [name] Hx PE on Eliquis- held  Stage 2 sacral ulcer   LOS: 5 days    Hector Sanor K. 07/18/2016

## 2016-07-18 NOTE — Progress Notes (Signed)
PROGRESS NOTE    [name]  WOE:[id number] DOB: May 15, 1941 DOA: 07/13/2016 PCP: [name], MD    Brief Narrative: 76 year old man with history significant for MM and stage IV NSCLC now with mets to brain discovered 04/2016 undergoing radiotherapy to brain lesions and Ketruda, pulmonary embolism on Xarelto. He has been admitted several times in the past few months, initially for SBO back in October for SBO, which resolved with conservative therapy. He was deemed a poor surgical candidate, but it was felt that if he could regain some strength he could undergo surgery. He was admitted again 12/15 with projectile vomiting and SOB. He was then found to have GNR bacteremia. Course became complicated by shock and DVT while he was off DOAC for colitis related GIB. IVC filter placed. He was started on TPN 1/2 and transferred to CIR 1/3. Later that day he developed vomiting again where and was suspected to have aspirated developing hypoxia and significant respiratory distress.    Assessment & Plan:   Active Problems:   Primary malignant neoplasm of lung metastatic to other site [facility])   Pressure injury of skin   Aspiration into airway   Goals of care, counseling/discussion   Palliative care by specialist   1-Recurrent SBO; NG tube intermittent suctioning.  Continue parenteral nutrition.  Continue with TPN. Pre-albumin at 18 from 10---6.  KUB improved SBO.  Awaiting surgical team recommendation regarding plans for sx.    Acute hypoxic Respiratory failure, secondary to possible aspiration event; Aspiration pneumonitis versus pneumonia suplemmental oxygen.  On IV ceftriaxone and flagyl.  Blood culture no growth to date.  Chest x ray increasing infiltrate  left lung.. Follow up recommended.  Repeat chest x ray 1-08 left side infiltrate improved. New infiltrate right; will give IV lasix.  Sat 94 on 4 L stable.   NSCLC (80% PDL1+)-->treating w/ [name] >Dr. [name]  H/o Multiple  Myeloma w/ skull lesion s/p XRT 2016 in remission.  H/o PE and DVT on Eliquis-->new DVTs on NOAC 12/21 > S/P temp filter 12/1 H/O HIT  On Argatroban.   Thrombocytopenia, Acute blood loss anemia. Now on argatroban.  HIT panel; repeated 1-05; at 0.248 Received one unit PRBC and platelet on 1-06 Appreciate oncology evaluation/  Continue with IV protonix.  Received another  unit PRBC 1-07.  Hb stable at 8. Platelet decrease to 36. Might need transfusion prior to sx.  Hematology following.    Chronic diastolic HF; Monitor volume status.  To received another dose of lasix today Lower extremity edema improved after lasix.   Transaminases; trending down.   Hypoalbuminemia;  Pre-albumin 18.  Continuee with TPN.   AKI;  Cr decreased to 1.08 from 1.5. Continue to follow trend.  Hyponatremia; resolved.   Addison diseases; decadron, Desmopressin.   Stage 2 sacral ulcer noted 2x2 cms Wound care consult/   DVT prophylaxis: argatroban.  Code Status: DNR Family Communication: sister at bedside.  Disposition Plan: remain in patient. PT, OT evaluation. Incentive spirometry as possible.    Consultants:   Surgery  Oncology    Procedures: none   Antimicrobials: ceftriaxone and flagyl.    Subjective: Throat pain is better. He is breathing well.  Denies abdominal pain, passing gas.     Objective: Vitals:   07/17/16 1530 07/17/16 2007 07/17/16 2256 07/18/16 0325  BP: 103/72 103/67 100/62 104/66  Pulse: 88 88 81 89  Resp: _0 Temp: 97 F (36.1 C) 98.4 F (36.9 C) 97.3 F (36.3 C) 97.3  F (36.3 C)  TempSrc: Axillary Oral Oral Oral  SpO2: 92% 97% 97% 94%  Weight:      Height:        Intake/Output Summary (Last 24 hours) at 07/18/16 0832 Last data filed at 07/18/16 0600  Gross per 24 hour  Intake          3545.44 ml  Output             3335 ml  Net           210.44 ml   Filed Weights   07/13/16 2130  Weight: 92.6 kg (204 lb 2.3 oz)     Examination:  General exam: Appears calm and comfortable ng tube in place. Fluid less dark.  Respiratory system: Clear to auscultation. Respiratory effort normal. Cardiovascular system: S1 & S2 heard, RRR. No JVD, murmurs, rubs, gallops or clicks. No pedal edema. Gastrointestinal system: Abdomen is distended, soft and mild tender.  Central nervous system: Alert and oriented. No focal neurological deficits. Extremities: Symmetric 5 x 5 power. Plus 1 edema.  Skin: ulcers lower extremity      Data Reviewed: I have personally reviewed following labs and imaging studies  CBC:  Recent Labs Lab 07/14/16 0514 07/15/16 0339 07/16/16 0448 07/16/16 1917 07/17/16 0257 07/17/16 1834 07/18/16 0656  WBC 4.4 6.7 8.1 10.9* 9.9  --  9.3  NEUTROABS 4.0  --   --   --   --   --  PENDING  HGB 8.2* 7.0* 6.4* 7.7* 7.0* 9.2* 8.1*  HCT 25.2* 21.4* 20.0* 23.3* 21.3* 27.6* 24.2*  MCV 101.6* 98.6 101.0* 94.7 94.2  --  91.3  PLT 35* 37* 33* 63* 53*  --  38*   Basic Metabolic Panel:  Recent Labs Lab 07/12/16 0413 07/13/16 0956 07/14/16 0514 07/15/16 0339 07/16/16 0448 07/17/16 0257 07/18/16 0656  NA 136 147* 144 147* 144 142 142  K 3.8 3.4* 4.0 3.4* 3.6 3.4* 3.5  CL 105 112* 114* 114* 113* 112* 112*  CO2 18* _0 GLUCOSE 229* 152* 216* 150* 199* 209* 186*  BUN 18 23* 38* 37* 38* 37* 36*  CREATININE 0.77 0.81 1.56* 1.08 0.82 0.73 0.66  CALCIUM 7.7* 8.2* 7.9* 7.7* 7.6* 7.4* 7.6*  MG 2.0  --  2.1 2.1  --   --  1.7  PHOS 2.6 3.1 4.4 2.7 2.3*  --  2.8   GFR: Estimated Creatinine Clearance: 88.1 mL/min (by C-G formula based on SCr of 0.66 mg/dL). Liver Function Tests:  Recent Labs Lab 07/12/16 0413 07/14/16 0514 07/15/16 0339 07/17/16 0257 07/18/16 0656  AST 45* 318* 138* 48* 32  ALT 27 168* 151* 110* 82*  ALKPHOS 97 83 74 71 74  BILITOT 0.7 0.5 0.2* <0.1* 0.6  PROT 5.1* 3.8* 3.8* 3.4* 3.3*  ALBUMIN 2.2* 1.5* 1.4* 1.4* 1.4*   No results for input(s): LIPASE,  AMYLASE in the last 168 hours. No results for input(s): AMMONIA in the last 168 hours. Coagulation Profile: No results for input(s): INR, PROTIME in the last 168 hours. Cardiac Enzymes: No results for input(s): CKTOTAL, CKMB, CKMBINDEX, TROPONINI in the last 168 hours. BNP (last 3 results) No results for input(s): PROBNP in the last 8760 hours. HbA1C: No results for input(s): HGBA1C in the last 72 hours. CBG:  Recent Labs Lab 07/16/16 2258 07/17/16 0920 07/17/16 1722 07/17/16 2111 07/17/16 2256  GLUCAP 188* 168* 106* 193* 208*   Lipid Profile:  Recent Labs  07/18/16 9147  TRIG 58   Thyroid Function Tests: No results for input(s): TSH, T4TOTAL, FREET4, T3FREE, THYROIDAB in the last 72 hours. Anemia Panel:  Recent Labs  07/16/16 1917  VITAMINB12 551  FOLATE 12.5  FERRITIN 1,489*  TIBC 136*  IRON 81  RETICCTPCT 2.1   Sepsis Labs: No results for input(s): PROCALCITON, LATICACIDVEN in the last 168 hours.  No results found for this or any previous visit (from the past 240 hour(s)).       Radiology Studies: Dg Chest Port 1 View  Result Date: 07/18/2016 CLINICAL DATA:  Pneumonia EXAM: PORTABLE CHEST 1 VIEW COMPARISON:  07/16/2016 FINDINGS: Nasogastric tube enters the stomach. Power port unchanged with its tip at the SVC RA junction. There is less pulmonary density on the left and slightly worsened pulmonary density on the right. This rapid change suggests possible edema. No lobar collapse or effusion. IMPRESSION: Left lung looks much better today. Slightly worsened density now on the right. These rapid changes are more typically seen with edema rather than bronchopneumonia. Electronically Signed   By: Nelson Chimes M.D.   On: 07/18/2016 07:48   Dg Abd Portable 1v  Result Date: 07/18/2016 CLINICAL DATA:  Small-bowel obstruction. EXAM: PORTABLE ABDOMEN - 1 VIEW COMPARISON:  07/15/2016. FINDINGS: NG tube noted coiled in stomach. IVC filter noted in stable position.  Continued improvement of small bowel distention. No free air. Pelvic calcifications consistent phleboliths. Degenerative changes scoliosis lumbar spine. Degenerative changes both hips. IMPRESSION: NG tube noted coiled stomach. Continued interim improvement of small bowel distention Electronically Signed   By: [place]   On: 07/18/2016 07:47        Scheduled Meds: . sodium chloride   Intravenous Once  . alteplase  2 mg Intracatheter Once  . cefTRIAXone (ROCEPHIN)  IV  2 g Intravenous Q24H  . desmopressin  10 mcg Nasal QHS  . dexamethasone  4 mg Intravenous Q12H  . insulin aspart  0-15 Units Subcutaneous 4 times per day  . mouth rinse  15 mL Mouth Rinse BID  . metronidazole  500 mg Intravenous Q8H  . pantoprazole (PROTONIX) IV  40 mg Intravenous Q12H   Continuous Infusions: . sodium chloride 10 mL/hr at 07/17/16 2300  . argatroban 1 mcg/kg/min (07/18/16 0822)     LOS: 5 days    Time spent: 35 minutes.     Elmarie Shiley, MD [facility] Pager [phone]  If 7PM-7AM, please contact night-coverage [email or web address] Password [facility] 07/18/2016, 8:32 AM

## 2016-07-18 NOTE — Progress Notes (Signed)
ANTICOAGULATION CONSULT NOTE - Follow Up Consult  Pharmacy Consult for Argatroban Indication: history of VTE  Allergies  Allergen Reactions  . Tramadol Palpitations    "Made my chest feel tight"  . Amoxicillin     REACTION: rash  . Penicillins Rash    Tolerates Rocephin Jan 2018.  TDD.  Has patient had a PCN reaction causing immediate rash, facial/tongue/throat swelling, SOB or lightheadedness with hypotension: unknown Has patient had a PCN reaction causing severe rash involving mucus membranes or skin necrosis: unknown Has patient had a PCN reaction that required hospitalization: no  Has patient had a PCN reaction occurring within the last 10 years: no  If all of the above answers are "NO", then may proceed with Cephalosporin use.     Patient Measurements: Height: '5\' 8"'$  (172.7 cm) Weight: 204 lb 2.3 oz (92.6 kg) IBW/kg (Calculated) : 68.4  Vital Signs: Temp: 97.5 F (36.4 C) (01/08 1233) Temp Source: Oral (01/08 1233) BP: 90/61 (01/08 1233) Pulse Rate: 86 (01/08 1233)  Labs:  Recent Labs  07/16/16 0448 07/16/16 1917 07/17/16 0257 07/17/16 1834 07/18/16 0656  HGB 6.4* 7.7* 7.0* 9.2* 8.1*  HCT 20.0* 23.3* 21.3* 27.6* 24.2*  PLT 33* 63* 53*  --  38*  APTT 70*  --  63*  --  61*  CREATININE 0.82  --  0.73  --  0.66    Estimated Creatinine Clearance: 88.1 mL/min (by C-G formula based on SCr of 0.66 mg/dL).   Medications:  Scheduled:  . sodium chloride   Intravenous Once  . alteplase  2 mg Intracatheter Once  . [START ON 07/19/2016] cefoTEtan (CEFOTAN) IV  1 g Intravenous To SS-Surg  . cefTRIAXone (ROCEPHIN)  IV  2 g Intravenous Q24H  . desmopressin  10 mcg Nasal QHS  . dexamethasone  4 mg Intravenous Q12H  . insulin aspart  0-15 Units Subcutaneous 4 times per day  . mouth rinse  15 mL Mouth Rinse BID  . metronidazole  500 mg Intravenous Q8H  . pantoprazole (PROTONIX) IV  40 mg Intravenous Q12H  . potassium chloride (KCL MULTIRUN) 30 mEq in 265 mL IVPB  30 mEq  Intravenous Once    Assessment: 76 y/o M with metastatic NSCLC, was on argatroban in past due to concern for HIT, SRA was negative last month, hospitalist and heme/onc prefers to continue with argatroban for now.   Pltc is low but stable since Argatroban resumed, aPTT is therapeutic.  No bleeding noted.  Heme/Onc work up of anemia and thrombocytopenia essentially unremarkable.   Plans for possible ex-lap from SBO 1/9.  Spoke with PA regarding half-life of Argatroban and plans for stopping prior to procedure.  Goal of Therapy:  APTT 50-90 sec Monitor platelets by anticoagulation protocol: Yes   Plan:  Continue Argatroban 70mg/kg/min - stop time 1/9 at 0900 Daily CBC and aPTT Watch for s/s of bleeding  [employer] Pharm.D., BCPS Clinical Pharmacist Pager [phone]/02/2017 2:14 PM

## 2016-07-19 ENCOUNTER — Encounter (HOSPITAL_COMMUNITY)
Admission: EM | Disposition: A | Discharge status: Home / Self Care | Payer: Self-pay | Source: Ambulatory Visit | Attending: Internal Medicine

## 2016-07-19 ENCOUNTER — Ambulatory Visit: Payer: Self-pay

## 2016-07-19 ENCOUNTER — Ambulatory Visit: Payer: Self-pay | Admitting: Internal Medicine

## 2016-07-19 ENCOUNTER — Inpatient Hospital Stay (HOSPITAL_COMMUNITY): Payer: Medicare Other

## 2016-07-19 ENCOUNTER — Inpatient Hospital Stay (HOSPITAL_COMMUNITY): Payer: Medicare Other | Admitting: Certified Registered Nurse Anesthetist

## 2016-07-19 ENCOUNTER — Other Ambulatory Visit: Payer: Self-pay

## 2016-07-19 HISTORY — PX: LAPAROSCOPIC ABDOMINAL EXPLORATION: SHX6249

## 2016-07-19 HISTORY — PX: LYSIS OF ADHESION: SHX5961

## 2016-07-19 LAB — CBC WITH DIFFERENTIAL/PLATELET
BASOS PCT: 0 %
Basophils Absolute: 0 10*3/uL (ref 0.0–0.1)
EOS ABS: 0.1 10*3/uL (ref 0.0–0.7)
EOS PCT: 1 %
HEMATOCRIT: 26.9 % — AB (ref 39.0–52.0)
Hemoglobin: 8.7 g/dL — ABNORMAL LOW (ref 13.0–17.0)
LYMPHS ABS: 0.2 10*3/uL — AB (ref 0.7–4.0)
Lymphocytes Relative: 3 %
MCH: 30 pg (ref 26.0–34.0)
MCHC: 32.3 g/dL (ref 30.0–36.0)
MCV: 92.8 fL (ref 78.0–100.0)
MONO ABS: 0.2 10*3/uL (ref 0.1–1.0)
Monocytes Relative: 2 %
NEUTROS ABS: 7.1 10*3/uL (ref 1.7–7.7)
Neutrophils Relative %: 94 %
Platelets: 75 10*3/uL — ABNORMAL LOW (ref 150–400)
RBC: 2.9 MIL/uL — ABNORMAL LOW (ref 4.22–5.81)
RDW: 21.6 % — AB (ref 11.5–15.5)
WBC: 7.6 10*3/uL (ref 4.0–10.5)

## 2016-07-19 LAB — BASIC METABOLIC PANEL
ANION GAP: 5 (ref 5–15)
BUN: 31 mg/dL — ABNORMAL HIGH (ref 6–20)
CO2: 24 mmol/L (ref 22–32)
Calcium: 7.7 mg/dL — ABNORMAL LOW (ref 8.9–10.3)
Chloride: 111 mmol/L (ref 101–111)
Creatinine, Ser: 0.7 mg/dL (ref 0.61–1.24)
GFR calc Af Amer: >60 mL/min (ref >60)
Glucose, Bld: 135 mg/dL — ABNORMAL HIGH (ref 65–99)
POTASSIUM: 4 mmol/L (ref 3.5–5.1)
SODIUM: 140 mmol/L (ref 135–145)

## 2016-07-19 LAB — CBC
HEMATOCRIT: 24.9 % — AB (ref 39.0–52.0)
HEMOGLOBIN: 8.2 g/dL — AB (ref 13.0–17.0)
MCH: 30.6 pg (ref 26.0–34.0)
MCHC: 32.9 g/dL (ref 30.0–36.0)
MCV: 92.9 fL (ref 78.0–100.0)
Platelets: 34 10*3/uL — ABNORMAL LOW (ref 150–400)
RBC: 2.68 MIL/uL — AB (ref 4.22–5.81)
RDW: 22.1 % — ABNORMAL HIGH (ref 11.5–15.5)
WBC: 8.3 10*3/uL (ref 4.0–10.5)

## 2016-07-19 LAB — MAGNESIUM: MAGNESIUM: 2 mg/dL (ref 1.7–2.4)

## 2016-07-19 LAB — GLUCOSE, CAPILLARY
GLUCOSE-CAPILLARY: 99 mg/dL (ref 65–99)
Glucose-Capillary: 149 mg/dL — ABNORMAL HIGH (ref 65–99)
Glucose-Capillary: 101 mg/dL — ABNORMAL HIGH (ref 65–99)

## 2016-07-19 LAB — APTT: APTT: 58 s — AB (ref 24–36)

## 2016-07-19 SURGERY — EXPLORATION, ABDOMEN, LAPAROSCOPIC
Anesthesia: General | Site: Abdomen

## 2016-07-19 MED ORDER — FENTANYL 40 MCG/ML IV SOLN
INTRAVENOUS | Status: DC
  Administered 2016-07-19: 18:00:00 via INTRAVENOUS
  Administered 2016-07-20: 80 ug via INTRAVENOUS
  Administered 2016-07-20: 250 ug via INTRAVENOUS
  Administered 2016-07-20: 190 ug via INTRAVENOUS
  Administered 2016-07-21: 100 ug via INTRAVENOUS
  Administered 2016-07-21: 10 ug via INTRAVENOUS
  Administered 2016-07-21: 20 ug via INTRAVENOUS
  Administered 2016-07-21: 100 ug via INTRAVENOUS
  Administered 2016-07-21: 90 ug via INTRAVENOUS
  Administered 2016-07-21: 17:00:00 via INTRAVENOUS
  Administered 2016-07-22: 90 ug via INTRAVENOUS
  Administered 2016-07-22: 10 ug via INTRAVENOUS
  Administered 2016-07-22: 80 ug via INTRAVENOUS
  Administered 2016-07-23: 151.9 ug via INTRAVENOUS
  Administered 2016-07-23: 170 ug via INTRAVENOUS
  Administered 2016-07-23: 04:00:00 via INTRAVENOUS
  Administered 2016-07-23: 90 ug via INTRAVENOUS
  Filled 2016-07-19 (×3): qty 25

## 2016-07-19 MED ORDER — FENTANYL CITRATE (PF) 100 MCG/2ML IJ SOLN
INTRAMUSCULAR | Status: AC
  Filled 2016-07-19: qty 2

## 2016-07-19 MED ORDER — SODIUM CHLORIDE 0.9% FLUSH: 9.0000 mL | INTRAVENOUS | Status: DC | PRN

## 2016-07-19 MED ORDER — DEXTROSE-NACL 5-0.9 % IV SOLN
INTRAVENOUS | Status: DC
  Administered 2016-07-19 (×2): via INTRAVENOUS

## 2016-07-19 MED ORDER — PROMETHAZINE HCL 25 MG/ML IJ SOLN: 6.2500 mg | INTRAMUSCULAR | Status: DC | PRN

## 2016-07-19 MED ORDER — LIDOCAINE 2% (20 MG/ML) 5 ML SYRINGE
INTRAMUSCULAR | Status: AC
  Filled 2016-07-19: qty 5

## 2016-07-19 MED ORDER — ROCURONIUM BROMIDE 50 MG/5ML IV SOSY
PREFILLED_SYRINGE | INTRAVENOUS | Status: AC
  Filled 2016-07-19: qty 5

## 2016-07-19 MED ORDER — HYDROMORPHONE HCL 1 MG/ML IJ SOLN
INTRAMUSCULAR | Status: AC
  Filled 2016-07-19: qty 1

## 2016-07-19 MED ORDER — DIPHENHYDRAMINE HCL 12.5 MG/5ML PO ELIX: 12.5000 mg | ORAL_SOLUTION | Freq: Four times a day (QID) | ORAL | Status: DC | PRN

## 2016-07-19 MED ORDER — FENTANYL 40 MCG/ML IV SOLN
INTRAVENOUS | Status: AC
  Filled 2016-07-19: qty 25

## 2016-07-19 MED ORDER — SODIUM CHLORIDE 0.9 % IV SOLN
Freq: Once | INTRAVENOUS | Status: AC
  Administered 2016-07-19: 07:00:00 via INTRAVENOUS

## 2016-07-19 MED ORDER — SODIUM CHLORIDE 0.9 % IR SOLN
Status: DC | PRN
  Administered 2016-07-19: 1000 mL

## 2016-07-19 MED ORDER — BUPIVACAINE HCL (PF) 0.25 % IJ SOLN
INTRAMUSCULAR | Status: AC
Start: 2016-07-19 — End: 2016-07-19
  Filled 2016-07-19: qty 30

## 2016-07-19 MED ORDER — HYDROMORPHONE HCL 1 MG/ML IJ SOLN
0.2500 mg | INTRAMUSCULAR | Status: DC | PRN
  Administered 2016-07-19 (×2): 0.5 mg via INTRAVENOUS

## 2016-07-19 MED ORDER — LIDOCAINE HCL 1 % IJ SOLN
INTRAMUSCULAR | Status: DC | PRN
  Administered 2016-07-19: 30 mL via INTRADERMAL

## 2016-07-19 MED ORDER — ONDANSETRON HCL 4 MG/2ML IJ SOLN
4.0000 mg | Freq: Four times a day (QID) | INTRAMUSCULAR | Status: DC | PRN
Start: 2016-07-19 — End: 2016-07-23

## 2016-07-19 MED ORDER — SUCCINYLCHOLINE CHLORIDE 200 MG/10ML IV SOSY
PREFILLED_SYRINGE | INTRAVENOUS | Status: DC | PRN
  Administered 2016-07-19: 100 mg via INTRAVENOUS

## 2016-07-19 MED ORDER — ALBUMIN HUMAN 5 % IV SOLN
INTRAVENOUS | Status: DC | PRN
  Administered 2016-07-19: 15:00:00 via INTRAVENOUS

## 2016-07-19 MED ORDER — DIPHENHYDRAMINE HCL 50 MG/ML IJ SOLN: 12.5000 mg | Freq: Four times a day (QID) | INTRAMUSCULAR | Status: DC | PRN

## 2016-07-19 MED ORDER — ETOMIDATE 2 MG/ML IV SOLN
INTRAVENOUS | Status: DC | PRN
  Administered 2016-07-19: 18 mg via INTRAVENOUS

## 2016-07-19 MED ORDER — 0.9 % SODIUM CHLORIDE (POUR BTL) OPTIME
TOPICAL | Status: DC | PRN
  Administered 2016-07-19 (×2): 1000 mL

## 2016-07-19 MED ORDER — TRACE MINERALS CR-CU-MN-SE-ZN 10-1000-500-60 MCG/ML IV SOLN
INTRAVENOUS | Status: AC
  Filled 2016-07-19 (×2): qty 1992

## 2016-07-19 MED ORDER — DEXTROSE 5 % IV SOLN
INTRAVENOUS | Status: DC | PRN
  Administered 2016-07-19: 20 ug/min via INTRAVENOUS

## 2016-07-19 MED ORDER — LIDOCAINE 2% (20 MG/ML) 5 ML SYRINGE
INTRAMUSCULAR | Status: DC | PRN
  Administered 2016-07-19: 40 mg via INTRAVENOUS

## 2016-07-19 MED ORDER — ROCURONIUM BROMIDE 10 MG/ML (PF) SYRINGE
PREFILLED_SYRINGE | INTRAVENOUS | Status: DC | PRN
  Administered 2016-07-19: 30 mg via INTRAVENOUS
  Administered 2016-07-19: 10 mg via INTRAVENOUS
  Administered 2016-07-19 (×2): 20 mg via INTRAVENOUS

## 2016-07-19 MED ORDER — LACTATED RINGERS IV SOLN
INTRAVENOUS | Status: DC
  Administered 2016-07-19 (×4): via INTRAVENOUS

## 2016-07-19 MED ORDER — LIDOCAINE HCL (PF) 1 % IJ SOLN
INTRAMUSCULAR | Status: AC
  Filled 2016-07-19: qty 30

## 2016-07-19 MED ORDER — ARGATROBAN 50 MG/50ML IV SOLN
1.1000 ug/kg/min | INTRAVENOUS | Status: AC
  Administered 2016-07-20 (×2): 1.1 ug/kg/min via INTRAVENOUS
  Administered 2016-07-20: 1 ug/kg/min via INTRAVENOUS
  Administered 2016-07-21 – 2016-07-25 (×14): 1.1 ug/kg/min via INTRAVENOUS
  Filled 2016-07-19 (×17): qty 50

## 2016-07-19 MED ORDER — ONDANSETRON HCL 4 MG/2ML IJ SOLN
INTRAMUSCULAR | Status: AC
  Filled 2016-07-19: qty 2

## 2016-07-19 MED ORDER — BUPIVACAINE HCL (PF) 0.25 % IJ SOLN
INTRAMUSCULAR | Status: DC | PRN
  Administered 2016-07-19: 30 mL

## 2016-07-19 MED ORDER — SUGAMMADEX SODIUM 200 MG/2ML IV SOLN
INTRAVENOUS | Status: DC | PRN
  Administered 2016-07-19: 200 mg via INTRAVENOUS

## 2016-07-19 MED ORDER — FAT EMULSION 20 % IV EMUL
240.0000 mL | INTRAVENOUS | Status: AC
  Filled 2016-07-19: qty 250

## 2016-07-19 MED ORDER — NALOXONE HCL 0.4 MG/ML IJ SOLN: 0.4000 mg | INTRAMUSCULAR | Status: DC | PRN

## 2016-07-19 MED ORDER — PROPOFOL 10 MG/ML IV BOLUS
INTRAVENOUS | Status: AC
  Filled 2016-07-19: qty 20

## 2016-07-19 SURGICAL SUPPLY — 84 items
ADH SKN CLS APL DERMABOND .7 (GAUZE/BANDAGES/DRESSINGS) ×1
BAG SPEC RTRVL LRG 6X4 10 (ENDOMECHANICALS) ×1
BLADE SURG ROTATE 9660 (MISCELLANEOUS) ×2 IMPLANT
CANISTER SUCTION 2500CC (MISCELLANEOUS) ×3 IMPLANT
CELLS DAT CNTRL 66122 CELL SVR (MISCELLANEOUS) ×1 IMPLANT
CHLORAPREP W/TINT 26ML (MISCELLANEOUS) ×3 IMPLANT
COVER SURGICAL LIGHT HANDLE (MISCELLANEOUS) ×3 IMPLANT
CUTTER FLEX LINEAR 45M (STAPLE) ×2 IMPLANT
DECANTER SPIKE VIAL GLASS SM (MISCELLANEOUS) ×6 IMPLANT
DERMABOND ADVANCED (GAUZE/BANDAGES/DRESSINGS) ×2
DERMABOND ADVANCED .7 DNX12 (GAUZE/BANDAGES/DRESSINGS) IMPLANT
DRAPE LAPAROSCOPIC ABDOMINAL (DRAPES) ×3 IMPLANT
DRAPE UTILITY XL STRL (DRAPES) ×6 IMPLANT
DRAPE WARM FLUID 44X44 (DRAPE) ×3 IMPLANT
DRSG OPSITE POSTOP 4X8 (GAUZE/BANDAGES/DRESSINGS) ×2 IMPLANT
ELECT CAUTERY BLADE 6.4 (BLADE) ×5 IMPLANT
ELECT REM PT RETURN 9FT ADLT (ELECTROSURGICAL) ×3
ELECTRODE REM PT RTRN 9FT ADLT (ELECTROSURGICAL) ×1 IMPLANT
GAUZE SPONGE 4X4 12PLY STRL (GAUZE/BANDAGES/DRESSINGS) ×3 IMPLANT
GEL ULTRASOUND 20GR AQUASONIC (MISCELLANEOUS) IMPLANT
GLOVE BIO SURGEON STRL SZ 6 (GLOVE) ×8 IMPLANT
GLOVE BIO SURGEON STRL SZ 6.5 (GLOVE) ×1 IMPLANT
GLOVE BIO SURGEON STRL SZ7.5 (GLOVE) ×4 IMPLANT
GLOVE BIO SURGEONS STRL SZ 6.5 (GLOVE) ×1
GLOVE BIOGEL PI IND STRL 6.5 (GLOVE) ×1 IMPLANT
GLOVE BIOGEL PI IND STRL 7.5 (GLOVE) IMPLANT
GLOVE BIOGEL PI INDICATOR 6.5 (GLOVE) ×2
GLOVE BIOGEL PI INDICATOR 7.5 (GLOVE) ×2
GLOVE INDICATOR 6.5 STRL GRN (GLOVE) ×2 IMPLANT
GOWN STRL REUS W/ TWL LRG LVL3 (GOWN DISPOSABLE) ×2 IMPLANT
GOWN STRL REUS W/TWL 2XL LVL3 (GOWN DISPOSABLE) ×6 IMPLANT
GOWN STRL REUS W/TWL LRG LVL3 (GOWN DISPOSABLE) ×9
KIT BASIN OR (CUSTOM PROCEDURE TRAY) ×3 IMPLANT
KIT ROOM TURNOVER OR (KITS) ×3 IMPLANT
L-HOOK LAP DISP 36CM (ELECTROSURGICAL) ×3
LHOOK LAP DISP 36CM (ELECTROSURGICAL) ×1 IMPLANT
LIGASURE IMPACT 36 18CM CVD LR (INSTRUMENTS) IMPLANT
NS IRRIG 1000ML POUR BTL (IV SOLUTION) ×6 IMPLANT
PAD ARMBOARD 7.5X6 YLW CONV (MISCELLANEOUS) ×6 IMPLANT
PENCIL BUTTON HOLSTER BLD 10FT (ELECTRODE) ×6 IMPLANT
POUCH SPECIMEN RETRIEVAL 10MM (ENDOMECHANICALS) ×2 IMPLANT
RELOAD PROXIMATE 75MM BLUE (ENDOMECHANICALS) ×6 IMPLANT
RELOAD STAPLE 45 3.5 BLU ETS (ENDOMECHANICALS) IMPLANT
RELOAD STAPLE 75 3.8 BLU REG (ENDOMECHANICALS) IMPLANT
RELOAD STAPLE TA45 3.5 REG BLU (ENDOMECHANICALS) ×3 IMPLANT
RETRACTOR WND ALEXIS 18 MED (MISCELLANEOUS) IMPLANT
RTRCTR WOUND ALEXIS 18CM MED (MISCELLANEOUS) ×3
SCALPEL HARMONIC ACE (MISCELLANEOUS) ×2 IMPLANT
SCISSORS LAP 5X35 DISP (ENDOMECHANICALS) ×2 IMPLANT
SET IRRIG TUBING LAPAROSCOPIC (IRRIGATION / IRRIGATOR) ×2 IMPLANT
SLEEVE ENDOPATH XCEL 5M (ENDOMECHANICALS) ×5 IMPLANT
SOL PREP POV-IOD 4OZ 10% (MISCELLANEOUS) ×2 IMPLANT
SPECIMEN JAR LARGE (MISCELLANEOUS) ×3 IMPLANT
SPONGE LAP 18X18 X RAY DECT (DISPOSABLE) ×2 IMPLANT
STAPLER GUN LINEAR PROX 60 (STAPLE) ×2 IMPLANT
STAPLER PROXIMATE 75MM BLUE (STAPLE) ×2 IMPLANT
STAPLER VISISTAT 35W (STAPLE) ×5 IMPLANT
SUCTION POOLE TIP (SUCTIONS) ×5 IMPLANT
SUT PDS AB 1 CT  36 (SUTURE) ×4
SUT PDS AB 1 CT 36 (SUTURE) IMPLANT
SUT PROLENE 2 0 CT2 30 (SUTURE) IMPLANT
SUT SILK 2 0 (SUTURE) ×3
SUT SILK 2 0 SH CR/8 (SUTURE) ×3 IMPLANT
SUT SILK 2-0 18XBRD TIE 12 (SUTURE) ×1 IMPLANT
SUT SILK 3 0 (SUTURE) ×3
SUT SILK 3 0 SH CR/8 (SUTURE) ×3 IMPLANT
SUT SILK 3-0 18XBRD TIE 12 (SUTURE) ×1 IMPLANT
SUT VIC AB 2-0 SH 18 (SUTURE) ×2 IMPLANT
SUT VIC AB 3-0 SH 18 (SUTURE) ×4 IMPLANT
SUT VICRYL AB 2 0 TIES (SUTURE) ×2 IMPLANT
SUT VICRYL AB 3 0 TIES (SUTURE) ×2 IMPLANT
SYRINGE IRR TOOMEY STRL 70CC (SYRINGE) ×2 IMPLANT
TOWEL OR 17X24 6PK STRL BLUE (TOWEL DISPOSABLE) ×3 IMPLANT
TOWEL OR 17X26 10 PK STRL BLUE (TOWEL DISPOSABLE) ×3 IMPLANT
TRAY LAPAROSCOPIC MC (CUSTOM PROCEDURE TRAY) ×3 IMPLANT
TROCAR BLADELESS 12MM (ENDOMECHANICALS) ×2 IMPLANT
TROCAR XCEL BLUNT TIP 100MML (ENDOMECHANICALS) ×3 IMPLANT
TROCAR XCEL NON-BLD 5MMX100MML (ENDOMECHANICALS) ×3 IMPLANT
TUBE CONNECTING 12'X1/4 (SUCTIONS) ×2
TUBE CONNECTING 12X1/4 (SUCTIONS) ×3 IMPLANT
TUBING FILTER THERMOFLATOR (ELECTROSURGICAL) ×3 IMPLANT
TUBING INSUFFLATION (TUBING) ×3 IMPLANT
WATER STERILE IRR 1000ML POUR (IV SOLUTION) ×3 IMPLANT
YANKAUER SUCT BULB TIP NO VENT (SUCTIONS) ×10 IMPLANT

## 2016-07-19 NOTE — Progress Notes (Signed)
PROGRESS NOTE    [name]  ZDG:[id number] DOB: 1941/02/01 DOA: 07/13/2016 PCP: [name], MD    Brief Narrative: 76 year old man with history significant for MM and stage IV NSCLC now with mets to brain discovered 04/2016 undergoing radiotherapy to brain lesions and Ketruda, pulmonary embolism on Xarelto. He has been admitted several times in the past few months, initially for SBO back in October for SBO, which resolved with conservative therapy. He was deemed a poor surgical candidate, but it was felt that if he could regain some strength he could undergo surgery. He was admitted again 12/15 with projectile vomiting and SOB. He was then found to have GNR bacteremia. Course became complicated by shock and DVT while he was off DOAC for colitis related GIB. IVC filter placed. He was started on TPN 1/2 and transferred to CIR 1/3. Later that day he developed vomiting again where and was suspected to have aspirated developing hypoxia and significant respiratory distress.   Patient admitted with Recurrent SBO and acute hypoxic respiratory failure secondary to aspiration PNA. He has been receiving TPN, IV antibiotics. He still has BG tube, surgery has been following and they are planning possible surgical intervention today. Patient is high risk for surgery.     Assessment & Plan:   Active Problems:   Primary malignant neoplasm of lung metastatic to other site [facility])   Pressure injury of skin   Aspiration into airway   Goals of care, counseling/discussion   Palliative care by specialist   1-Recurrent SBO; NG tube clamp.  Continue parenteral nutrition.  Continue with TPN. Pre-albumin at 18 from 10---6.  KUB improved SBO.  Surgery planning surgery for today.  Will transfuse one unit of platelet. I will also repeat chest x ray.   Acute hypoxic Respiratory failure, secondary to possible aspiration event; Aspiration pneumonitis versus pneumonia suplemmental oxygen, stable at 94 % on 4  l.  On IV ceftriaxone and flagyl.  Blood culture no growth to date.  Chest x ray increasing infiltrate  left lung.. Follow up recommended.  Repeat chest x ray 1-08 left side infiltrate improved. New infiltrate right; received IV lasix.  Sat 94 on 4 L stable.  Will repeat chest  X ray.   CNS;  Patient sleepy this morning, he wake ups answer questions.  Suspect related to compazine received this morning.  BP stable and CBG normal.  Will check blood gas.    NSCLC (80% PDL1+)-->treating w/ [name] >Dr. [name]  H/o Multiple Myeloma w/ skull lesion s/p XRT 2016 in remission.  H/o PE and DVT on Eliquis-->new DVTs on NOAC 12/21 > S/P temp filter 12/1 H/O HIT  On Argatroban. Stopped at 9 am in anticipation for surgery   Thrombocytopenia, Acute blood loss anemia. Now on argatroban.  HIT panel; repeated 1-05; at 0.248 Received one unit PRBC and platelet on 1-06 Appreciate oncology evaluation/  Continue with IV protonix.  Received another  unit PRBC 1-07.  Hb stable at 8. Platelet decrease to 30. One unit platelet ordered.  Hematology following.    Chronic diastolic HF; Monitor volume status.  To received another dose of lasix today Lower extremity edema improved after lasix.   Transaminases; trending down.   Hypoalbuminemia;  Pre-albumin 18.  Continuee with TPN.   AKI;  Cr decreased to 1.08 from 1.5. Continue to follow trend.  Hyponatremia; resolved.   Addison diseases; decadron, Desmopressin.   Stage 2 sacral ulcer noted 2x2 cms Wound care consult/   DVT prophylaxis: argatroban.  Code Status: DNR Family Communication: wife and patient sister at bedside.  Disposition Plan: remain in patient. Possible sx today   Consultants:   Surgery  Oncology    Procedures: none   Antimicrobials: ceftriaxone and flagyl.    Subjective: Still with throat pain. Denies worsening abdominal pain.  Passing small amount of gas. He has been having nausea.      Objective: Vitals:   07/18/16 1513 07/18/16 1955 07/18/16 2325 07/19/16 0311  BP: 94/69 (!) '89/55 94/73 99/71 '$  Pulse: 93 96 86 86  Resp: 15  (!) 22 19  Temp: 97.4 F (36.3 C) 98 F (36.7 C) 97.6 F (36.4 C) 97.7 F (36.5 C)  TempSrc: Oral Oral Oral Axillary  SpO2: 96% 98% 100% 94%  Weight:      Height:        Intake/Output Summary (Last 24 hours) at 07/19/16 0834 Last data filed at 07/19/16 5361  Gross per 24 hour  Intake           2046.8 ml  Output             1865 ml  Net            181.8 ml   Filed Weights   07/13/16 2130  Weight: 92.6 kg (204 lb 2.3 oz)    Examination:  General exam: Appears calm and comfortable ng tube in place. Fluid less dark. Sleepy, but wake up and answer questions.  Respiratory system: Clear to auscultation. Respiratory effort normal. Cardiovascular system: S1 & S2 heard, RRR. No JVD, murmurs, rubs, gallops or clicks. No pedal edema. Gastrointestinal system: Abdomen is distended, soft and mild tender.  Central nervous system: Alert and oriented. No focal neurological deficits. Extremities: Symmetric 5 x 5 power. Plus 1 edema.  Skin: ulcers lower extremity      Data Reviewed: I have personally reviewed following labs and imaging studies  CBC:  Recent Labs Lab 07/14/16 0514  07/16/16 0448 07/16/16 1917 07/17/16 0257 07/17/16 1834 07/18/16 0656 07/19/16 0245  WBC 4.4  < > 8.1 10.9* 9.9  --  9.3 8.3  NEUTROABS 4.0  --   --   --   --   --  8.7*  --   HGB 8.2*  < > 6.4* 7.7* 7.0* 9.2* 8.1* 8.2*  HCT 25.2*  < > 20.0* 23.3* 21.3* 27.6* 24.2* 24.9*  MCV 101.6*  < > 101.0* 94.7 94.2  --  91.3 92.9  PLT 35*  < > 33* 63* 53*  --  38* 34*  < > = values in this interval not displayed. Basic Metabolic Panel:  Recent Labs Lab 07/13/16 0956 07/14/16 0514 07/15/16 0339 07/16/16 0448 07/17/16 0257 07/18/16 0656 07/19/16 0245  NA 147* 144 147* 144 142 142 140  K 3.4* 4.0 3.4* 3.6 3.4* 3.5 4.0  CL 112* 114* 114* 113* 112* 112*  111  CO2 '27 24 26 23 22 24 24  '$ GLUCOSE 152* 216* 150* 199* 209* 186* 135*  BUN 23* 38* 37* 38* 37* 36* 31*  CREATININE 0.81 1.56* 1.08 0.82 0.73 0.66 0.70  CALCIUM 8.2* 7.9* 7.7* 7.6* 7.4* 7.6* 7.7*  MG  --  2.1 2.1  --   --  1.7 2.0  PHOS 3.1 4.4 2.7 2.3*  --  2.8  --    GFR: Estimated Creatinine Clearance: 88.1 mL/min (by C-G formula based on SCr of 0.7 mg/dL). Liver Function Tests:  Recent Labs Lab 07/14/16 0514 07/15/16 0339 07/17/16 0257 07/18/16 0656  AST  318* 138* 48* 32  ALT 168* 151* 110* 82*  ALKPHOS 83 74 71 74  BILITOT 0.5 0.2* <0.1* 0.6  PROT 3.8* 3.8* 3.4* 3.3*  ALBUMIN 1.5* 1.4* 1.4* 1.4*   No results for input(s): LIPASE, AMYLASE in the last 168 hours. No results for input(s): AMMONIA in the last 168 hours. Coagulation Profile: No results for input(s): INR, PROTIME in the last 168 hours. Cardiac Enzymes: No results for input(s): CKTOTAL, CKMB, CKMBINDEX, TROPONINI in the last 168 hours. BNP (last 3 results) No results for input(s): PROBNP in the last 8760 hours. HbA1C: No results for input(s): HGBA1C in the last 72 hours. CBG:  Recent Labs Lab 07/17/16 2111 07/17/16 2256 07/18/16 0837 07/18/16 1556 07/18/16 2124  GLUCAP 193* 208* 125* 125* 125*   Lipid Profile:  Recent Labs  07/18/16 0656  TRIG 58   Thyroid Function Tests: No results for input(s): TSH, T4TOTAL, FREET4, T3FREE, THYROIDAB in the last 72 hours. Anemia Panel:  Recent Labs  07/16/16 1917  VITAMINB12 551  FOLATE 12.5  FERRITIN 1,489*  TIBC 136*  IRON 81  RETICCTPCT 2.1   Sepsis Labs: No results for input(s): PROCALCITON, LATICACIDVEN in the last 168 hours.  No results found for this or any previous visit (from the past 240 hour(s)).       Radiology Studies: Dg Chest Port 1 View  Result Date: 07/18/2016 CLINICAL DATA:  Pneumonia EXAM: PORTABLE CHEST 1 VIEW COMPARISON:  07/16/2016 FINDINGS: Nasogastric tube enters the stomach. Power port unchanged with its tip at  the SVC RA junction. There is less pulmonary density on the left and slightly worsened pulmonary density on the right. This rapid change suggests possible edema. No lobar collapse or effusion. IMPRESSION: Left lung looks much better today. Slightly worsened density now on the right. These rapid changes are more typically seen with edema rather than bronchopneumonia. Electronically Signed   By: Nelson Chimes M.D.   On: 07/18/2016 07:48   Dg Abd Portable 1v  Result Date: 07/18/2016 CLINICAL DATA:  Small-bowel obstruction. EXAM: PORTABLE ABDOMEN - 1 VIEW COMPARISON:  07/15/2016. FINDINGS: NG tube noted coiled in stomach. IVC filter noted in stable position. Continued improvement of small bowel distention. No free air. Pelvic calcifications consistent phleboliths. Degenerative changes scoliosis lumbar spine. Degenerative changes both hips. IMPRESSION: NG tube noted coiled stomach. Continued interim improvement of small bowel distention Electronically Signed   By: [place]   On: 07/18/2016 07:47        Scheduled Meds: . sodium chloride   Intravenous Once  . alteplase  2 mg Intracatheter Once  . cefoTEtan (CEFOTAN) IV  1 g Intravenous To SS-Surg  . cefTRIAXone (ROCEPHIN)  IV  2 g Intravenous Q24H  . desmopressin  10 mcg Nasal QHS  . dexamethasone  4 mg Intravenous Q12H  . insulin aspart  0-15 Units Subcutaneous 4 times per day  . mouth rinse  15 mL Mouth Rinse BID  . metronidazole  500 mg Intravenous Q8H  . pantoprazole (PROTONIX) IV  40 mg Intravenous Q12H   Continuous Infusions: . sodium chloride 10 mL/hr at 07/17/16 2300  . argatroban 1 mcg/kg/min (07/18/16 2241)  . dextrose 5 % and 0.9% NaCl       LOS: 6 days    Time spent: 35 minutes.     Elmarie Shiley, MD [facility] Pager [phone]  If 7PM-7AM, please contact night-coverage [email or web address] Password [facility] 07/19/2016, 8:34 AM

## 2016-07-19 NOTE — Progress Notes (Signed)
Day of Surgery  Subjective: Pt much less distended, and passing some gas, but has failed non operative therapy multiple times.  Has not been able to pass food challenge.  Multiple readmits interfering with tx for lung cancer.    Objective: Vital signs in last 24 hours: Temp:  [97.4 F (36.3 C)-98.2 F (36.8 C)] 97.8 F (36.6 C) (01/09 1108) Pulse Rate:  [78-96] 84 (01/09 1108) Resp:  [15-26] 18 (01/09 1108) BP: (89-108)/(55-85) 106/77 (01/09 1108) SpO2:  [94 %-100 %] 99 % (01/09 1108) Last BM Date: 07/13/16  Intake/Output from previous day: 01/08 0701 - 01/09 0700 In: 2072.4 [P.O.:470; I.V.:1034.4; IV Piggyback:568] Out: 1865 [Urine:1865] Intake/Output this shift: Total I/O In: 220 [Blood:220] Out: -   General appearance: alert, cooperative and no distress GI: soft, non-tender; non-distended; active bowel sounds  Lab Results:   Recent Labs  07/19/16 0245 07/19/16 1157  WBC 8.3 7.6  HGB 8.2* 8.7*  HCT 24.9* 26.9*  PLT 34* 75*   BMET  Recent Labs  07/18/16 0656 07/19/16 0245  NA 142 140  K 3.5 4.0  CL 112* 111  CO2 24 24  GLUCOSE 186* 135*  BUN 36* 31*  CREATININE 0.66 0.70  CALCIUM 7.6* 7.7*   Prealbumin 18.3  PT/INR No results for input(s): LABPROT, INR in the last 72 hours. ABG No results for input(s): PHART, HCO3 in the last 72 hours.  Invalid input(s): PCO2, PO2  Studies/Results: Dg Chest Port 1 View  Result Date: 07/19/2016 CLINICAL DATA:  Known small bowel obstruction EXAM: PORTABLE CHEST 1 VIEW COMPARISON:  07/18/2016 FINDINGS: Cardiac shadow is stable. Nasogastric catheter is noted coiled within the stomach. A right chest wall port is again seen and stable. Patchy densities are again identified bilaterally but overall improved when compare with the prior exam. No sizable effusion is seen. No acute bony abnormality is noted. IMPRESSION: Continued improved aeration bilaterally. Some patchy residual changes are again seen. Electronically Signed    By: Inez Catalina M.D.   On: 07/19/2016 09:49   Dg Chest Port 1 View  Result Date: 07/18/2016 CLINICAL DATA:  Pneumonia EXAM: PORTABLE CHEST 1 VIEW COMPARISON:  07/16/2016 FINDINGS: Nasogastric tube enters the stomach. Power port unchanged with its tip at the SVC RA junction. There is less pulmonary density on the left and slightly worsened pulmonary density on the right. This rapid change suggests possible edema. No lobar collapse or effusion. IMPRESSION: Left lung looks much better today. Slightly worsened density now on the right. These rapid changes are more typically seen with edema rather than bronchopneumonia. Electronically Signed   By: Nelson Chimes M.D.   On: 07/18/2016 07:48   Dg Abd Portable 1v  Result Date: 07/18/2016 CLINICAL DATA:  Small-bowel obstruction. EXAM: PORTABLE ABDOMEN - 1 VIEW COMPARISON:  07/15/2016. FINDINGS: NG tube noted coiled in stomach. IVC filter noted in stable position. Continued improvement of small bowel distention. No free air. Pelvic calcifications consistent phleboliths. Degenerative changes scoliosis lumbar spine. Degenerative changes both hips. IMPRESSION: NG tube noted coiled stomach. Continued interim improvement of small bowel distention Electronically Signed   By: Marcello Moores  Register   On: 07/18/2016 07:47    Anti-infectives: Anti-infectives    Start     Dose/Rate Route Frequency Ordered Stop   07/19/16 0800  [MAR Hold]  cefoTEtan (CEFOTAN) 1 g in dextrose 5 % 50 mL IVPB     (MAR Hold since 07/19/16 1329)   1 g 100 mL/hr over 30 Minutes Intravenous To [facility] 07/18/16 1112  07/20/16 0800   07/14/16 2200  [MAR Hold]  cefTRIAXone (ROCEPHIN) 2 g in dextrose 5 % 50 mL IVPB     (MAR Hold since 07/19/16 1329)   2 g 100 mL/hr over 30 Minutes Intravenous Every 24 hours 07/14/16 0915     07/13/16 2200  cefTRIAXone (ROCEPHIN) 1 g in dextrose 5 % 50 mL IVPB  Status:  Discontinued     1 g 100 mL/hr over 30 Minutes Intravenous Every 24 hours 07/13/16 2132  07/14/16 0915   07/13/16 2200  [MAR Hold]  metroNIDAZOLE (FLAGYL) IVPB 500 mg     (MAR Hold since 07/19/16 1329)   500 mg 100 mL/hr over 60 Minutes Intravenous Every 8 hours 07/13/16 2132        Assessment/Plan: DJT:[id number] in the setting of stage IV lung cancer, bilateral DVTs, Hx of PE on Xarelto, now argatroban.  On hold.   - No evidence of carcinomatosis, PMH appendectomy  Continue to hold Xarelto (last dose 07/12/16 @ 1710) pending possible surgery, NPO, NG tube.  Plan laparoscopic lysis of adhesions today.  Got 1 unit platelets with improvement.   Discussed that I would open if we had to remove bowel.   Reviewed risks of bowel leakage today.  Also reviewed risks of unexpected findings.    Acute hypoxic respiratory failure secondary to possible aspiration event- supplemental O2, Ceftriaxone 1/3>> Thrombocytopenia - see above. NSCLC- being treated with Keytruda, Dr. [name] Hx PE on Eliquis- held  Stage 2 sacral ulcer   LOS: 6 days    [facility] 07/19/2016

## 2016-07-19 NOTE — Op Note (Signed)
PRE-OPERATIVE DIAGNOSIS: Recurrent small bowel obstruction  POST-OPERATIVE DIAGNOSIS:  Same, and meckel's diverticulum  PROCEDURE:  Procedure(s): Lap assisted lysis of adhesions, small bowel resection, and laparoscopic resection of Meckel's diverticulum  SURGEON:  Surgeon(s): Stark Klein, MD  ASSIST:  Sharyn Dross, RNFA  ANESTHESIA:   local and general  DRAINS: none   LOCAL MEDICATIONS USED:  MARCAINE    and LIDOCAINE   SPECIMEN:  Source of Specimen:  Small bowel resection, meckel's diverticulum  DISPOSITION OF SPECIMEN:  PATHOLOGY  COUNTS:  YES  DICTATION: .Dragon Dictation  PLAN OF CARE: Admit to inpatient   PATIENT DISPOSITION:  PACU - hemodynamically stable.  FINDINGS:  Point of obstruction in RUQ in proximal to mid jejunum. ? Microscopic walled off perforation with obstruction.    EBL: minimal  PROCEDURE:   Pt was identified in the holding area and taken to the OR where he was placed supine on the OR table.  General anesthesia was induced.  Foley catheter was placed.  Abdomen was clipped, prepped and draped in sterile fashion.  Timeout was performed according to the surgical safety checklist.  When all was correct, we continued.    The patient was placed into reverse trendelenburg position and rotated to the right. Local was infiltrated into the skin at the left costal margin.  The abdomen was accessed with a 5 mm Optiview trocar under direct visualzation.  Two additional 5 mm trocars were placed in the left abdomen.  The patient was then placed into trendelenburg position.  The small bowel was reduced into the upper abdomen.  The cecum was located.  There were some adhesions around the RLQ.  These were divided sharply.  The small bowel was then run proximally.  A Meckel's diverticulum was located.  Most of the bowel was decompressed, but there was one loop of bowel in the right upper abdomen with some omentum adherent to it.  There was a small fluid collection  underneath.  This was non bilious.  An omental band was divided with the harmonic scalpel.  The small bowel underneath was examined carefully.  No perforation was seen and there was no evidence of bowel leakage.  The amount of fluid present was minimal.  This loop of bowel, however, was thickened and was clearly the point of obstruction.    The subcostal port was upsized to a 12 mm port and the Meckel's was divided with the endo GIA stapler, taking care not to narrow the ileum.  This was retrieved with an endocatch bag.    A small 5-6 cm incision was made in the upper abdomen just over the abnormal bowel. The subcutaneous tissues and the fascia were divided with the cautery.  A small alexis wound protector was placed. Towels were placed to protect the skin.  The abnormal bowel was felt and was definitely firm.  The small bowel was divided with the GIA 75 mm stapler on either side of the obstructing point.  The mesentery was divided with clamps and 2-0 vicryl ties.   A side-to-side, functional end-to-end stapled anastamosis was created in the standard fashion.  No bleeding was noted internally at the staple line.  The defect was closed with the TA 60.  Several sites were oversewn on this staple line.  Two tension relieving sutures were placed at the apex of the anastamosis.  The mesenteric defect was closed with 2-0 vicryl running suture.  The wound protector was removed, the towels were removed, and gloves were changed.  The abdomen  was irrigated.  The fascia was closed with running #1 looped PDS sutures.  The deeper tissues of the midline wound were reapproximated with interrupted 3-0 vicryl sutures.   The skin of all the incisions was closed with running 4-0 monocryl sutures.  The port sites were dressed with dermabond and the midline incision dressed with a honeycomb dressing.    The patient was allowed to emerge from anesthesia and taken to the PACU in stable condition.  Needle, sponge, and instrument  counts were correct x 2.    The

## 2016-07-19 NOTE — Progress Notes (Signed)
ANTICOAGULATION CONSULT NOTE - Follow Up Consult  Pharmacy Consult for Argatroban Indication: history of VTE  Allergies  Allergen Reactions  . Tramadol Palpitations    "Made my chest feel tight"  . Amoxicillin Rash  . Penicillins Rash    Tolerates Rocephin Jan 2018.  TDD.  Has patient had a PCN reaction causing immediate rash, facial/tongue/throat swelling, SOB or lightheadedness with hypotension: unknown Has patient had a PCN reaction causing severe rash involving mucus membranes or skin necrosis: unknown Has patient had a PCN reaction that required hospitalization: no  Has patient had a PCN reaction occurring within the last 10 years: no  If all of the above answers are "NO", then may proceed with Cephalosporin use.     Patient Measurements: Height: '5\' 8"'$  (172.7 cm) Weight: 204 lb 2.3 oz (92.6 kg) IBW/kg (Calculated) : 68.4  Vital Signs: Temp: 98.2 F (36.8 C) (01/09 0932) Temp Source: Oral (01/09 0932) BP: 104/75 (01/09 0932) Pulse Rate: 84 (01/09 0932)  Labs:  Recent Labs  07/17/16 0257 07/17/16 1834 07/18/16 0656 07/19/16 0245  HGB 7.0* 9.2* 8.1* 8.2*  HCT 21.3* 27.6* 24.2* 24.9*  PLT 53*  --  38* 34*  APTT 63*  --  61* 58*  CREATININE 0.73  --  0.66 0.70    Estimated Creatinine Clearance: 88.1 mL/min (by C-G formula based on SCr of 0.7 mg/dL).   Medications:  Scheduled:  . sodium chloride   Intravenous Once  . alteplase  2 mg Intracatheter Once  . cefoTEtan (CEFOTAN) IV  1 g Intravenous To SS-Surg  . cefTRIAXone (ROCEPHIN)  IV  2 g Intravenous Q24H  . desmopressin  10 mcg Nasal QHS  . dexamethasone  4 mg Intravenous Q12H  . insulin aspart  0-15 Units Subcutaneous 4 times per day  . mouth rinse  15 mL Mouth Rinse BID  . metronidazole  500 mg Intravenous Q8H  . pantoprazole (PROTONIX) IV  40 mg Intravenous Q12H    Assessment: 76 y/o M with metastatic NSCLC, was on argatroban in past due to concern for HIT, SRA was negative last month, hospitalist  and heme/onc prefers to continue with argatroban for now.   Pltc is low but stable since Argatroban resumed, aPTT was therapeutic earlier this am.   Infusion currently off due to planned surgery (ex-lap).   Goal of Therapy:  APTT 50-90 sec Monitor platelets by anticoagulation protocol: Yes   Plan:  1. Follow up on anticoagulation plans post surgery    Vincenza Hews, PharmD, BCPS 07/19/2016, 10:07 AM Pager: [phone]

## 2016-07-19 NOTE — Consult Note (Signed)
   [facility] CM Inpatient Consult   07/19/2016  [name] 1940-10-09 [id number]   Patient is an eligible beneficiary with his [employer] Noted that this is the patient's 5th admission in 6 months.  Chart review reveals the patient is a 76 year old man admitted with Recurrent SBO and acute hypoxic respiratory failure secondary to aspiration PNA. He has been receiving TPN, IV antibiotics. He still has BG tube, surgery has been following and they are planning possible surgical intervention today per MD notes. Patient will be followed for [place] Management needs for post hospital follow up.  Will follow up with inpatient care management staff when more appropriate to discharge planning and needs.  For questions, please contact:  Natividad Brood, RN BSN [facility] Liaison  [phone] business mobile phone Toll free office [phone]

## 2016-07-19 NOTE — Progress Notes (Signed)
ANTICOAGULATION CONSULT NOTE - Follow Up Consult  Pharmacy Consult for Argatroban Indication: history of VTE  Allergies  Allergen Reactions  . Tramadol Palpitations    "Made my chest feel tight"  . Amoxicillin Rash  . Penicillins Rash    Tolerates Rocephin Jan 2018.  TDD.  Has patient had a PCN reaction causing immediate rash, facial/tongue/throat swelling, SOB or lightheadedness with hypotension: unknown Has patient had a PCN reaction causing severe rash involving mucus membranes or skin necrosis: unknown Has patient had a PCN reaction that required hospitalization: no  Has patient had a PCN reaction occurring within the last 10 years: no  If all of the above answers are "NO", then may proceed with Cephalosporin use.     Patient Measurements: Height: '5\' 8"'$  (172.7 cm) Weight: 204 lb 2.3 oz (92.6 kg) IBW/kg (Calculated) : 68.4  Vital Signs: Temp: 97.2 F (36.2 C) (01/09 1840) Temp Source: Oral (01/09 2021) BP: 85/73 (01/09 2021) Pulse Rate: 103 (01/09 2021)  Labs:  Recent Labs  07/17/16 0257  07/18/16 0656 07/19/16 0245 07/19/16 1157  HGB 7.0*  < > 8.1* 8.2* 8.7*  HCT 21.3*  < > 24.2* 24.9* 26.9*  PLT 53*  --  38* 34* 75*  APTT 63*  --  61* 58*  --   CREATININE 0.73  --  0.66 0.70  --   < > = values in this interval not displayed.  Estimated Creatinine Clearance: 88.1 mL/min (by C-G formula based on SCr of 0.7 mg/dL).   Medications:  Scheduled:  . sodium chloride   Intravenous Once  . alteplase  2 mg Intracatheter Once  . cefTRIAXone (ROCEPHIN)  IV  2 g Intravenous Q24H  . desmopressin  10 mcg Nasal QHS  . dexamethasone  4 mg Intravenous Q12H  . fentaNYL   Intravenous Q4H  . fentaNYL      . HYDROmorphone      . insulin aspart  0-15 Units Subcutaneous 4 times per day  . mouth rinse  15 mL Mouth Rinse BID  . metronidazole  500 mg Intravenous Q8H  . pantoprazole (PROTONIX) IV  40 mg Intravenous Q12H    Assessment: 76yo male s/p LOA, SBR, & lap resection  of Meckel's diverticulum, to resume Argatroban at 1am.  Pt previously therapeutic on Argatroban 49mg/kg/min and will resume at this rate.  Goal of Therapy:  aPTT 50-90 seconds Monitor platelets by anticoagulation protocol: Yes   Plan:  Argatroban 17m/kg/min Daily PTT  KeGracy BruinsPharmD Clinical Pharmacist [facility]

## 2016-07-19 NOTE — Progress Notes (Signed)
PHARMACY - ADULT TOTAL PARENTERAL NUTRITION CONSULT NOTE   Pharmacy Consult:  TPN Indication:  SBO  Patient Measurements: Height: _0  (172.7 cm) Weight: 204 lb 2.3 oz (92.6 kg) IBW/kg (Calculated) : 68.4 TPN AdjBW (KG): 74.5 Body mass index is 31.04 kg/m.   Assessment:  26 YOM with recent admission on 11/26-12/4/17 for SBO, re-admitted to [facility] on 06/24/16 with PE and recurrent SBO. Patient was unable to tolerate fluids and had continued nausea and vomiting.  NG tube placement failed multiple times and Pharmacy was consulted to manage TPN for nutritional support.  Patient transferred to [facility] on 07/14/15.  Per documentation, his intake was </= 75% for 1 month PTA.  Now with severe malnutrition.  Surgery is delayed because patient was too debilitated.   WU:JWJXBJYNWG 10.7 > 6.5> 18.5 -now WNL.  NG O/P down 0 mL - tube clamped 1/8. Tentative plan for ex-lap on 1/9. Per CCS on 1/8: passing flatus, no longer feels distended. No BM yesterday. Plain films improved. CCS clamped NG tube 1/8,  Endo: no hx DM - CBGs 125 while TPN infusing with 20 units insulin/bag  (on Decadron 35m BID).  DDAVP for diabetes insipidus. Insulin requirements in the past 24 hours: 20 units in TPN + 6 units SSI Lytes: K+ 4 after 30 K yesterday. Mag 2.0 after 2 gm bolus yesterday , *day without lytes: 1/4, 1/5, 1/6 Renal: BPH.  AKI resolved - SCr down to wnl, BUN 31,  Pulm: 4L [place] Cards:  not on med AC: Xarelto PTA for hx PE/DVT > transitioned to argatroban and then Eliquis, bleeding on AC so received IVC filter, then resumed Xarelto and now on argatroban again - hgb 8.5, plts 34 Onc: MM / SCC / metastatic lung cancer on immunotherapy with Ketruda (on hold) Hepatobil: AST/ALT improving, alk phos and tbili WNL.  TG WNL Neuro: DJD - A&O ID: CTX/Flagyl for colitis - now afebrile, WBC WNL Best Practices: argatroban TPN Access: right CVC port placed 10/30/12 TPN start date: 07/09/16  Nutritional Goals: 2150-2350 kCal and  115-125 gm protein per day  Current Nutrition:  TPN  Plan:  - Continue cycle at 14 hrs: Clinimix E 5/15 (with lytes) infuse 1992 mls over 14 hours: 50 ml/hr x 1 hr, then 158 ml/hr x 12 hrs, then 50 ml/hr x 1 hr - Lipids at 17 ml/hr x 14 hours (total 240 ml/day) - TPN will provide 1894 kCal and 100 gm of protein per day, meeting 88% of kCal and 87% of protein needs.  Capping TPN at this rate d/t critical Clinimix shortage. - Daily multivitamin and trace elements in TPN - Continue moderate SSI to q6h (09:00, 16:00, 20:00, 23:00) and keep 20 units of regular insulin in TPN - F/U AM labs  MEudelia Bunch Pharm.D. [id number]/03/2017 10:23 AM

## 2016-07-19 NOTE — Progress Notes (Signed)
Care of pt assumed by MA Tanesia Butner RN 

## 2016-07-19 NOTE — Transfer of Care (Signed)
Immediate Anesthesia Transfer of Care Note  Patient: [name]  Procedure(s) Performed: Procedure(s): HAND ASSISTED LAPAROSCOPIC ABDOMINAL EXPLORATION W/ LYSIS OF ADHESIONS (N/A) LYSIS OF ADHESION (N/A)  Patient Location: PACU  Anesthesia Type:General  Level of Consciousness: awake and alert   Airway & Oxygen Therapy: Patient Spontanous Breathing and Patient connected to nasal cannula oxygen  Post-op Assessment: Report given to RN and Post -op Vital signs reviewed and stable  Post vital signs: Reviewed and stable  Last Vitals:  Vitals:   07/19/16 1300 07/19/16 1722  BP: 102/75 106/66  Pulse: 85 (!) 102  Resp: 19 18  Temp:  36.8 C    Last Pain:  Vitals:   07/19/16 1108  TempSrc: Oral  PainSc:       Patients Stated Pain Goal: 0 (30/16/01 0932)  Complications: No apparent anesthesia complications

## 2016-07-19 NOTE — Progress Notes (Signed)
CBG done 149

## 2016-07-19 NOTE — Progress Notes (Signed)
PT Cancellation Note  Patient Details Name: [name] MRN: [id number] DOB: April 06, 1941   Cancelled Treatment:    Reason Eval/Treat Not Completed: Other (comment). Pt planned for surgery (ex-lap) today at 13:30. Pt and family asked to hold today due to going for surgery. PT to return as able s/p surgery as appropriate.   Tristen Pennino M Bao Bazen 07/19/2016, 10:28 AM   Kittie Plater, PT, DPT Pager #: [phone] Office #: [phone]

## 2016-07-19 NOTE — Anesthesia Procedure Notes (Signed)
Procedure Name: Intubation Date/Time: 07/19/2016 2:57 PM Performed by: Oletta Lamas Pre-anesthesia Checklist: Patient identified, Emergency Drugs available, Suction available and Patient being monitored Patient Re-evaluated:Patient Re-evaluated prior to inductionOxygen Delivery Method: Circle System Utilized Preoxygenation: Pre-oxygenation with 100% oxygen Intubation Type: IV induction, Rapid sequence and Cricoid Pressure applied Laryngoscope Size: Glidescope and 3 Grade View: Grade I Tube type: Oral Tube size: 7.5 mm Number of attempts: 1 Airway Equipment and Method: Stylet Placement Confirmation: ETT inserted through vocal cords under direct vision,  positive ETCO2 and breath sounds checked- equal and bilateral Tube secured with: Tape Dental Injury: Teeth and Oropharynx as per pre-operative assessment  Comments: Gastric tube suctioned prior to induction.  Thick purulent secretions suctioned from oropharynx prior to intubation.

## 2016-07-19 NOTE — Anesthesia Preprocedure Evaluation (Signed)
Anesthesia Evaluation  Patient identified by MRN, date of birth, ID band Patient awake    Reviewed: Allergy & Precautions, NPO status , Patient's Chart, lab work & pertinent test results  History of Anesthesia Complications Negative for: history of anesthetic complications  Airway Mallampati: II  TM Distance: >3 FB Neck ROM: Full    Dental  (+) Missing, Teeth Intact   Pulmonary shortness of breath, former smoker,  Stage 4 adenocarcinoma of lung   breath sounds clear to auscultation       Cardiovascular + Peripheral Vascular Disease and +CHF   Rhythm:Regular  LE DVT,    Neuro/Psych  Headaches,  Neuromuscular disease    GI/Hepatic Neg liver ROS, hiatal hernia, PUD, GERD  Medicated,  Endo/Other  negative endocrine ROS  Renal/GU negative Renal ROS     Musculoskeletal  (+) Arthritis ,   Abdominal   Peds  Hematology  (+) anemia ,   Anesthesia Other Findings   Reproductive/Obstetrics                             Anesthesia Physical Anesthesia Plan  ASA: III  Anesthesia Plan: General   Post-op Pain Management:    Induction: Intravenous  Airway Management Planned: Oral ETT  Additional Equipment: None  Intra-op Plan:   Post-operative Plan: Extubation in OR  Informed Consent: I have reviewed the patients History and Physical, chart, labs and discussed the procedure including the risks, benefits and alternatives for the proposed anesthesia with the patient or authorized representative who has indicated his/her understanding and acceptance.   Dental advisory given  Plan Discussed with: Surgeon and CRNA  Anesthesia Plan Comments:         Anesthesia Quick Evaluation

## 2016-07-20 ENCOUNTER — Encounter (HOSPITAL_COMMUNITY): Payer: Self-pay | Admitting: General Surgery

## 2016-07-20 DIAGNOSIS — T17908D Unspecified foreign body in respiratory tract, part unspecified causing other injury, subsequent encounter: Secondary | ICD-10-CM

## 2016-07-20 LAB — CBC
HCT: 24.9 % — ABNORMAL LOW (ref 39.0–52.0)
Hemoglobin: 8.1 g/dL — ABNORMAL LOW (ref 13.0–17.0)
MCH: 30.6 pg (ref 26.0–34.0)
MCHC: 32.5 g/dL (ref 30.0–36.0)
MCV: 94 fL (ref 78.0–100.0)
PLATELETS: 59 10*3/uL — AB (ref 150–400)
RBC: 2.65 MIL/uL — AB (ref 4.22–5.81)
RDW: 21.9 % — ABNORMAL HIGH (ref 11.5–15.5)
WBC: 7.8 10*3/uL (ref 4.0–10.5)

## 2016-07-20 LAB — PREPARE PLATELET PHERESIS
Blood Product Expiration Date: 201801112359
ISSUE DATE / TIME: 201801090903
UNIT TYPE AND RH: 6200

## 2016-07-20 LAB — BASIC METABOLIC PANEL
Anion gap: 7 (ref 5–15)
BUN: 27 mg/dL — AB (ref 6–20)
CALCIUM: 7.3 mg/dL — AB (ref 8.9–10.3)
CHLORIDE: 111 mmol/L (ref 101–111)
CO2: 22 mmol/L (ref 22–32)
CREATININE: 0.85 mg/dL (ref 0.61–1.24)
GFR calc non Af Amer: >60 mL/min (ref >60)
GLUCOSE: 145 mg/dL — AB (ref 65–99)
Potassium: 3.9 mmol/L (ref 3.5–5.1)
Sodium: 140 mmol/L (ref 135–145)

## 2016-07-20 LAB — APTT
APTT: 63 s — AB (ref 24–36)
aPTT: 48 seconds — ABNORMAL HIGH (ref 24–36)
aPTT: 66 seconds — ABNORMAL HIGH (ref 24–36)

## 2016-07-20 LAB — GLUCOSE, CAPILLARY
GLUCOSE-CAPILLARY: 171 mg/dL — AB (ref 65–99)
Glucose-Capillary: 226 mg/dL — ABNORMAL HIGH (ref 65–99)
Glucose-Capillary: 209 mg/dL — ABNORMAL HIGH (ref 65–99)

## 2016-07-20 MED ORDER — TRACE MINERALS CR-CU-MN-SE-ZN 10-1000-500-60 MCG/ML IV SOLN
INTRAVENOUS | Status: AC
  Administered 2016-07-20: 18:00:00 via INTRAVENOUS
  Filled 2016-07-20 (×2): qty 1992

## 2016-07-20 MED ORDER — SODIUM CHLORIDE 0.9 % IV BOLUS (SEPSIS)
500.0000 mL | Freq: Once | INTRAVENOUS | Status: AC
  Administered 2016-07-20: 500 mL via INTRAVENOUS

## 2016-07-20 MED ORDER — FAT EMULSION 20 % IV EMUL
240.0000 mL | INTRAVENOUS | Status: AC
  Administered 2016-07-20: 240 mL via INTRAVENOUS
  Filled 2016-07-20: qty 250

## 2016-07-20 MED ORDER — SODIUM CHLORIDE 0.9 % IV BOLUS (SEPSIS)
250.0000 mL | Freq: Once | INTRAVENOUS | Status: AC
  Administered 2016-07-20: 250 mL via INTRAVENOUS

## 2016-07-20 MED ORDER — TRACE MINERALS CR-CU-MN-SE-ZN 10-1000-500-60 MCG/ML IV SOLN
INTRAVENOUS | Status: AC
  Administered 2016-07-20: 08:00:00 via INTRAVENOUS
  Filled 2016-07-20: qty 1992

## 2016-07-20 NOTE — Clinical Social Work Placement (Signed)
   CLINICAL SOCIAL WORK PLACEMENT  NOTE  Date:  07/20/2016  Patient Details  Name: YUE GLASHEEN MRN: [id number] Date of Birth: 1940/10/01  Clinical Social Work is seeking post-discharge placement for this patient at the [place] level of care (*CSW will initial, date and re-position this form in  chart as items are completed):  Yes   Patient/family provided with [facility] Work Department's list of facilities offering this level of care within the geographic area requested by the patient (or if unable, by the patient's family).  Yes   Patient/family informed of their freedom to choose among providers that offer the needed level of care, that participate in Medicare, Medicaid or managed care program needed by the patient, have an available bed and are willing to accept the patient.  Yes   Patient/family informed of [facility]'s ownership interest in [facility] and [facility], as well as of the fact that they are under no obligation to receive care at these facilities.  PASRR submitted to EDS on 07/20/16     PASRR number received on       Existing PASRR number confirmed on 07/20/16     FL2 transmitted to all facilities in geographic area requested by pt/family on 07/20/16     FL2 transmitted to all facilities within larger geographic area on       Patient informed that his/her managed care company has contracts with or will negotiate with certain facilities, including the following:            Patient/family informed of bed offers received.  Patient chooses bed at       Physician recommends and patient chooses bed at      Patient to be transferred to   on  .  Patient to be transferred to facility by       Patient family notified on   of transfer.  Name of family member notified:        PHYSICIAN Please sign FL2     Additional Comment:    _______________________________________________ Candie Chroman, LCSW 07/20/2016,  2:22 PM

## 2016-07-20 NOTE — Progress Notes (Signed)
NP has advised to administer 500 mL fluid bolus. I have checked the patient's temperature which is currently 98.7 and cycled another BP after initiation of fluid bolus. Will continue to monitor patient status and update NP to any changes.    07/20/16 0049  Vitals  Temp 98.7 F (37.1 C)  Temp Source Axillary  BP (!) 81/59  MAP (mmHg) 67  BP Location Right Arm  BP Method Automatic  Patient Position (if appropriate) Lying  Pulse Rate 98  Pulse Rate Source Monitor  ECG Heart Rate 98  Cardiac Rhythm NSR  Heart Block Type 1st degree AVB  Ectopy PAC  Resp 15  Oxygen Therapy  SpO2 95 %

## 2016-07-20 NOTE — Progress Notes (Signed)
Physical Therapy Treatment Patient Details Name: [name] MRN: [id number] DOB: 1941-02-08 Today's Date: 07/20/2016    History of Present Illness Patient is a 76 y/o male with hx of recently diagnosed stage IV NSCLC (80% PDL1+) with brain mets, DVT and PEs s/p IVC filter,recent SBO, DM presents with recurrent SBO. Transferred to CIR on 1/3, but developed vomiting again and respiratory distress requiring nonrebreather and transferred back to acute care. NG tube placed due to SBO. s/p lap LOA 1/9.    PT Comments    Pt mobilizing slowly, requiring +2 assistance to sit EOB. Pt fatigues quickly, tolerating 5 minutes while sitting EOB. BP monitored throughout session 108/86 supine, 98/71 sitting, 116/82 (return to supine). O2 sats stable throughout session remaining in 90s on 2L. Family present and supportive throughout session.    Follow Up Recommendations  CIR     Equipment Recommendations  None recommended by PT    Recommendations for Other Services       Precautions / Restrictions Precautions Precautions: Fall Precaution Comments: monitor BP, hypotensive Restrictions Weight Bearing Restrictions: No    Mobility  Bed Mobility Overal bed mobility: Needs Assistance Bed Mobility: Rolling;Sidelying to Sit;Sit to Sidelying Rolling: +2 for physical assistance;Total assist Sidelying to sit: +2 for physical assistance;Total assist     Sit to sidelying: +2 for physical assistance;Total assist General bed mobility comments: educated in log roll technique to minimize abdominal pain, +2 total assist to pull up in bed  Transfers                    Ambulation/Gait                 Stairs            Wheelchair Mobility    Modified Rankin (Stroke Patients Only)       Balance Overall balance assessment: Needs assistance Sitting-balance support: Feet supported Sitting balance-Leahy Scale: Poor Sitting balance - Comments: posterior lean requiring min to  mod assist. Able to support self for short periods of time.                             Cognition Arousal/Alertness: Awake/alert Behavior During Therapy: WFL for tasks assessed/performed Overall Cognitive Status: Within Functional Limits for tasks assessed                      Exercises General Exercises - Lower Extremity Long Arc Quad: Strengthening;10 reps;Both    General Comments General comments (skin integrity, edema, etc.): BP: supine 108/86, 98/71 and 96/77 sitting, 116/82 return to supine      Pertinent Vitals/Pain Pain Assessment: 0-10 Pain Score: 6  Pain Location: abdomen Pain Descriptors / Indicators: Sore;Guarding;Grimacing Pain Intervention(s): Monitored during session;Limited activity within patient's tolerance;PCA encouraged    Home Living                      Prior Function            PT Goals (current goals can now be found in the care plan section) Acute Rehab PT Goals Patient Stated Goal: to go to rehab PT Goal Formulation: With patient Time For Goal Achievement: 08/01/16 Potential to Achieve Goals: Good Progress towards PT goals: Progressing toward goals    Frequency    Min 3X/week      PT Plan Current plan remains appropriate    Co-evaluation   Reason for Co-Treatment:  For patient/therapist safety PT goals addressed during session: Mobility/safety with mobility OT goals addressed during session: Strengthening/ROM     End of Session Equipment Utilized During Treatment: Gait belt Activity Tolerance: Patient limited by fatigue Patient left: in bed;with call bell/phone within reach;with family/visitor present     Time: 8069-9967 PT Time Calculation (min) (ACUTE ONLY): 27 min  Charges:  $Therapeutic Activity: 8-22 mins                    G Codes:      Cassell Clement, PT, CSCS Pager [phone] Office [phone]  07/20/2016, 4:07 PM

## 2016-07-20 NOTE — Progress Notes (Signed)
ANTICOAGULATION CONSULT NOTE - FOLLOW UP    aPTT = 66 (goal 50 - 90sec)   Assessment: 38 YOM with history of PE/DVT and new DVT s/p IVC filter to continue on argatroban.  SRA and HIT Ab are negative; however, patient with significant anemia and thrombocytopenia.  APTT therapeutic x 2.  No bleeding per RN.   Plan: - Continue argatroban at 1.1 mcg/kg/min - F/U AM aPTT    [name], PharmD, BCPS 07/20/2016, 4:50 PM

## 2016-07-20 NOTE — Progress Notes (Signed)
1 Day Post-Op  Subjective: Had some soft pressures overnight.  Pain controlled with PCA.  Objective: Vital signs in last 24 hours: Temp:  [97.2 F (36.2 C)-99 F (37.2 C)] 99 F (37.2 C) (01/10 0350) Pulse Rate:  [78-109] 94 (01/10 0350) Resp:  [0-26] 14 (01/10 0400) BP: (80-108)/(54-85) 85/62 (01/10 0350) SpO2:  [89 %-100 %] 94 % (01/10 0400) FiO2 (%):  [100 %] 100 % (01/09 2000) Last BM Date: 07/13/16  Intake/Output from previous day: 01/09 0701 - 01/10 0700 In: 3502.6 [I.V.:2782.6; Blood:220; IV Piggyback:500] Out: 3382 [Urine:1450; Blood:100] Intake/Output this shift: Total I/O In: 2032.6 [I.V.:1782.6; IV Piggyback:250] Out: 400 [Urine:400]  General appearance: sleeping, awakens easily when aroused.  Clear mental status. GI: soft, slightly distended;dressings c/d/i. Lab Results:   Recent Labs  07/19/16 1157 07/20/16 0216  WBC 7.6 7.8  HGB 8.7* 8.1*  HCT 26.9* 24.9*  PLT 75* 59*   BMET  Recent Labs  07/19/16 0245 07/20/16 0216  NA 140 140  K 4.0 3.9  CL 111 111  CO2 24 22  GLUCOSE 135* 145*  BUN 31* 27*  CREATININE 0.70 0.85  CALCIUM 7.7* 7.3*   Prealbumin 18.3  PT/INR No results for input(s): LABPROT, INR in the last 72 hours. ABG No results for input(s): PHART, HCO3 in the last 72 hours.  Invalid input(s): PCO2, PO2  Studies/Results: Dg Chest Port 1 View  Result Date: 07/19/2016 CLINICAL DATA:  Known small bowel obstruction EXAM: PORTABLE CHEST 1 VIEW COMPARISON:  07/18/2016 FINDINGS: Cardiac shadow is stable. Nasogastric catheter is noted coiled within the stomach. A right chest wall port is again seen and stable. Patchy densities are again identified bilaterally but overall improved when compare with the prior exam. No sizable effusion is seen. No acute bony abnormality is noted. IMPRESSION: Continued improved aeration bilaterally. Some patchy residual changes are again seen. Electronically Signed   By: Inez Catalina M.D.   On: 07/19/2016 09:49    Dg Chest Port 1 View  Result Date: 07/18/2016 CLINICAL DATA:  Pneumonia EXAM: PORTABLE CHEST 1 VIEW COMPARISON:  07/16/2016 FINDINGS: Nasogastric tube enters the stomach. Power port unchanged with its tip at the SVC RA junction. There is less pulmonary density on the left and slightly worsened pulmonary density on the right. This rapid change suggests possible edema. No lobar collapse or effusion. IMPRESSION: Left lung looks much better today. Slightly worsened density now on the right. These rapid changes are more typically seen with edema rather than bronchopneumonia. Electronically Signed   By: Nelson Chimes M.D.   On: 07/18/2016 07:48   Dg Abd Portable 1v  Result Date: 07/18/2016 CLINICAL DATA:  Small-bowel obstruction. EXAM: PORTABLE ABDOMEN - 1 VIEW COMPARISON:  07/15/2016. FINDINGS: NG tube noted coiled in stomach. IVC filter noted in stable position. Continued improvement of small bowel distention. No free air. Pelvic calcifications consistent phleboliths. Degenerative changes scoliosis lumbar spine. Degenerative changes both hips. IMPRESSION: NG tube noted coiled stomach. Continued interim improvement of small bowel distention Electronically Signed   By: Marcello Moores  Register   On: 07/18/2016 07:47    Anti-infectives: Anti-infectives    Start     Dose/Rate Route Frequency Ordered Stop   07/19/16 0800  cefoTEtan (CEFOTAN) 1 g in dextrose 5 % 50 mL IVPB     1 g 100 mL/hr over 30 Minutes Intravenous To [facility] 07/18/16 1112 07/19/16 1518   07/14/16 2200  cefTRIAXone (ROCEPHIN) 2 g in dextrose 5 % 50 mL IVPB     2  g 100 mL/hr over 30 Minutes Intravenous Every 24 hours 07/14/16 0915     07/13/16 2200  cefTRIAXone (ROCEPHIN) 1 g in dextrose 5 % 50 mL IVPB  Status:  Discontinued     1 g 100 mL/hr over 30 Minutes Intravenous Every 24 hours 07/13/16 2132 07/14/16 0915   07/13/16 2200  metroNIDAZOLE (FLAGYL) IVPB 500 mg     500 mg 100 mL/hr over 60 Minutes Intravenous Every 8 hours  07/13/16 2132        Assessment/Plan: UGA:[id number] in the setting of stage IV lung cancer, bilateral DVTs, Hx of PE on Xarelto, now argatroban.   Laparoscopic LOA, removal of meckel's diverticulum.  Small bowel resection for ? Contained perforation.  Await pathology.    Acute hypoxic respiratory failure secondary to possible aspiration event- supplemental O2, Ceftriaxone 1/3>> Thrombocytopenia - see above. NSCLC- being treated with Keytruda, Dr. [name] Hx PE on Eliquis- held  Stage 2 sacral ulcer   LOS: 7 days    [facility] 07/20/2016

## 2016-07-20 NOTE — Progress Notes (Signed)
ANTICOAGULATION CONSULT NOTE - Follow Up Consult  Pharmacy Consult for Argatroban Indication: Hx of VTE  Allergies  Allergen Reactions  . Tramadol Palpitations    "Made my chest feel tight"  . Amoxicillin Rash  . Penicillins Rash    Tolerates Rocephin Jan 2018.  TDD.  Has patient had a PCN reaction causing immediate rash, facial/tongue/throat swelling, SOB or lightheadedness with hypotension: unknown Has patient had a PCN reaction causing severe rash involving mucus membranes or skin necrosis: unknown Has patient had a PCN reaction that required hospitalization: no  Has patient had a PCN reaction occurring within the last 10 years: no  If all of the above answers are "NO", then may proceed with Cephalosporin use.    Patient Measurements: Height: '5\' 8"'$  (172.7 cm) Weight: 204 lb 2.3 oz (92.6 kg) IBW/kg (Calculated) : 68.4  Vital Signs: Temp: 99 F (37.2 C) (01/10 0350) Temp Source: Axillary (01/10 0350) BP: 85/62 (01/10 0350) Pulse Rate: 94 (01/10 0350)  Labs:  Recent Labs  07/18/16 0656 07/19/16 0245 07/19/16 1157 07/20/16 0216  HGB 8.1* 8.2* 8.7* 8.1*  HCT 24.2* 24.9* 26.9* 24.9*  PLT 38* 34* 75* 59*  APTT 61* 58*  --  48*  CREATININE 0.66 0.70  --  0.85    Estimated Creatinine Clearance: 82.9 mL/min (by C-G formula based on SCr of 0.85 mg/dL).    Assessment: 76 y/o M with metastatic NSCLC, was on argatroban in past due to concern for HIT, SRA was negative last month, CCM prefers to cont with argatroban for now.  1/10: aPTT is just below therapeutic range after re-start s/p small bowel resection  Goal of Therapy:  aPTT 50-90  seconds Monitor platelets by anticoagulation protocol: Yes   Plan:  -Inc argatroban slightly to 1.1 mcg/kg/min -Check aPTT at 1000  Narda Bonds 07/20/2016,5:28 AM

## 2016-07-20 NOTE — NC FL2 (Signed)
[place] LEVEL OF CARE SCREENING TOOL     IDENTIFICATION  Patient Name: [name] Birthdate: [birth year] Sex: male Admission Date (Current Location): 07/13/2016  [facility] and [place] Number:  Herbalist and Address:  [facility] [place]. [facility], [place] [address] [place], [place] [address]      Provider Number: [id number]  Attending Physician Name and Address:  Caren Griffins, MD  Relative Name and Phone Number:       Current Level of Care: Hospital Recommended Level of Care: [place] Prior Approval Number:    Date Approved/Denied:   PASRR Number: [id number]  Discharge Plan: SNF    Current Diagnoses: Patient Active Problem List   Diagnosis Date Noted  . Goals of care, counseling/discussion   . Palliative care by specialist   . Pressure injury of skin 07/14/2016  . Aspiration into airway 07/14/2016  . Debility 07/13/2016  . Acute hypoxemic respiratory failure ([place]) 07/13/2016  . Poor tolerance for activity   . Acute deep vein thrombosis (DVT) of distal vein of both lower extremities (HCC)   . Small bowel obstruction   . Hypotension   . Acute blood loss anemia   . Anemia of chronic disease   . Hyperglycemia   . Hypoalbuminemia due to protein-calorie malnutrition ([place])   . Primary malignant neoplasm of lung metastatic to other site [facility])   . DVT (deep vein thrombosis) in pregnancy ([place])   . Generalized abdominal pain   . Sepsis associated hypotension ([place])   . Benign prostatic hyperplasia without lower urinary tract symptoms   . GERD (gastroesophageal reflux disease) 06/24/2016  . Chronic diastolic CHF (congestive heart failure) ([place]) 06/24/2016  . GIB (gastrointestinal bleeding) 06/24/2016  . Malnutrition of moderate degree 06/20/2016  . Malignant neoplasm of upper lobe of right lung ([place])   . Pulmonary emboli ([place]) 06/19/2016  . Pulmonary embolus ([place]) 06/19/2016  . Abnormal CT scan, small bowel   .  Encephalopathy, metabolic   . Thrombocytopenia ([place])   . Diabetes insipidus ([place])   . Hypernatremia   . SBO (small bowel obstruction) 06/06/2016  . Hypokalemia 06/06/2016  . Adenocarcinoma of right lung, stage 4 ([place]) 05/27/2016  . Nausea with vomiting 05/18/2016  . Dehydration 05/18/2016  . Brain metastases ([place]) 05/04/2016  . Mediastinal adenopathy 05/04/2016  . Peripheral neuropathy due to chemotherapy ([place]) 02/08/2016  . Long term current use of anticoagulant therapy 11/04/2014  . Peripheral edema 11/04/2014  . Knee pain, bilateral 11/04/2014  . Headache 07/14/2014  . Abdominal aortic atherosclerosis ([place]) 04/28/2014  . History of pulmonary embolism 02/07/2014  . Acute deep vein thrombosis (DVT) of tibial vein of right lower extremity ([place]) 02/07/2014  . DVT of leg (deep venous thrombosis) ([place]) 02/06/2014  . Multiple myeloma ([place]) 01/27/2012  . Hyponatremia 01/16/2012  . Anemia 01/16/2012  . Dyspnea 04/18/2011  . Preop cardiovascular exam 04/18/2011  . ABDOMINAL PAIN-RUQ 04/29/2010  . IRRITABLE BOWEL SYNDROME 02/19/2009  . GERD 01/03/2008  . PERSONAL HX COLONIC POLYPS 01/03/2008    Orientation RESPIRATION BLADDER Height & Weight     Self, Time, Situation, Place  O2 (Nasal Canula 2 L) Incontinent, Indwelling catheter Weight: 204 lb 2.3 oz (92.6 kg) Height:  '5\' 8"'$  (172.7 cm)  BEHAVIORAL SYMPTOMS/MOOD NEUROLOGICAL BOWEL NUTRITION STATUS   (None)  (None) Continent  (NG is clamped. Cyclic TPN.)  AMBULATORY STATUS COMMUNICATION OF NEEDS Skin   Limited Assist Verbally Skin abrasions, Bruising, Other (Comment) ([place], Skin tear, Weeping. Pressure  injury stage 2: mid lower sacrum (foam every 3 days), Open/dehisced wound/incision: Non pressure wound right foot (foam every 3 days))                       Personal Care Assistance Level of Assistance  Bathing, Feeding, Dressing Bathing Assistance: Maximum assistance Feeding assistance:  (Currently NPO) Dressing  Assistance: Maximum assistance     Functional Limitations Info  Sight, Hearing, Speech Sight Info: Adequate Hearing Info: Adequate Speech Info: Adequate    SPECIAL CARE FACTORS FREQUENCY  PT (By licensed PT), Blood pressure, Diabetic urine testing, OT (By licensed OT)     PT Frequency: 5 x week OT Frequency: 5 x week            Contractures Contractures Info: Not present    Additional Factors Info  Allergies, Code Status Code Status Info: DNR Allergies Info: Tramadol, Amoxicillin, Penicillins           Current Medications (07/20/2016):  This is the current hospital active medication list Current Facility-Administered Medications  Medication Dose Route Frequency Provider Last Rate Last Dose  . 0.9 %  sodium chloride infusion   Intravenous Continuous Kalman Shan, MD 10 mL/hr at 07/17/16 2300    . 0.9 %  sodium chloride infusion   Intravenous Once [employer], MD      . alteplase (CATHFLO ACTIVASE) injection 2 mg  2 mg Intracatheter Once Violeta Gelinas, MD      . argatroban 1 mg/mL infusion  1.1 mcg/kg/min Intravenous Continuous Stevphen Rochester, RPH 6.1 mL/hr at 07/20/16 0849 1.1 mcg/kg/min at 07/20/16 0849  . cefTRIAXone (ROCEPHIN) 2 g in dextrose 5 % 50 mL IVPB  2 g Intravenous Q24H Earnie Larsson, RPH   2 g at 07/19/16 2254  . desmopressin (DDAVP NASAL) 0.01 % solution 10 mcg  10 mcg Nasal QHS Duayne Cal, NP   10 mcg at 07/19/16 2200  . dexamethasone (DECADRON) injection 4 mg  4 mg Intravenous Q12H Duayne Cal, NP   4 mg at 07/20/16 0929  . dextrose 5 %-0.9 % sodium chloride infusion   Intravenous Continuous Belkys A Regalado, MD 75 mL/hr at 07/19/16 2253    . diphenhydrAMINE (BENADRYL) injection 12.5 mg  12.5 mg Intravenous Q6H PRN Almond Lint, MD       Or  . diphenhydrAMINE (BENADRYL) 12.5 MG/5ML elixir 12.5 mg  12.5 mg Oral Q6H PRN Almond Lint, MD      . TPN (CLINIMIX-E) Adult   Intravenous Continuous TPN Leatha Gilding, MD 125 mL/hr at  07/20/16 0829     And  . fat emulsion 20 % infusion 240 mL  240 mL Intravenous Continuous TPN Leatha Gilding, MD 24 mL/hr at 07/20/16 0829 240 mL at 07/20/16 0829  . TPN (CLINIMIX-E) Adult   Intravenous Cyclic-See Admin Instructions Costin Otelia Sergeant, MD       And  . fat emulsion 20 % infusion 240 mL  240 mL Intravenous Continuous TPN Costin Otelia Sergeant, MD      . fentaNYL (SUBLIMAZE) injection 12.5 mcg  12.5 mcg Intravenous Q4H PRN Lora Paula, MD   50 mcg at 07/19/16 1712  . fentaNYL 40 mcg/mL PCA injection   Intravenous Q4H Almond Lint, MD      . insulin aspart (novoLOG) injection 0-15 Units  0-15 Units Subcutaneous 4 times per day Ilda Basset, [facility]   2 Units at 07/19/16 0900  . LORazepam (ATIVAN)  injection 0.5 mg  0.5 mg Intravenous Q6H PRN Milagros Loll, MD   0.5 mg at 07/15/16 0050  . MEDLINE mouth rinse  15 mL Mouth Rinse BID Raylene Miyamoto, MD   15 mL at 07/20/16 1000  . metroNIDAZOLE (FLAGYL) IVPB 500 mg  500 mg Intravenous Q8H Donalynn Furlong Johnson, RPH   500 mg at 07/20/16 0528  . naloxone [facility]) injection 0.4 mg  0.4 mg Intravenous PRN Stark Klein, MD       And  . sodium chloride flush (NS) 0.9 % injection 9 mL  9 mL Intravenous PRN Stark Klein, MD      . ondansetron (ZOFRAN) injection 4 mg  4 mg Intravenous Q6H PRN Milagros Loll, MD   4 mg at 07/19/16 1638  . ondansetron (ZOFRAN) injection 4 mg  4 mg Intravenous Q6H PRN Stark Klein, MD      . pantoprazole (PROTONIX) injection 40 mg  40 mg Intravenous Q12H Belkys A Regalado, MD   40 mg at 07/20/16 0929  . phenol (CHLORASEPTIC) mouth spray 1 spray  1 spray Mouth/Throat PRN Belkys A Regalado, MD   1 spray at 07/17/16 1007  . prochlorperazine (COMPAZINE) injection 5 mg  5 mg Intravenous Q6H PRN Milagros Loll, MD   5 mg at 07/19/16 [phone]  . sodium chloride (OCEAN) 0.65 % nasal spray 1 spray  1 spray Each Nare PRN Jeryl Columbia, NP   1 spray at 07/18/16 0552  . sodium chloride flush (NS) 0.9 % injection 10-40  mL  10-40 mL Intracatheter PRN Elmarie Shiley, MD         Discharge Medications: Please see discharge summary for a list of discharge medications.  Relevant Imaging Results:  Relevant Lab Results:   Additional Information SS#: [id number]  Candie Chroman, LCSW

## 2016-07-20 NOTE — Progress Notes (Signed)
ANTICOAGULATION CONSULT NOTE - Follow Up Consult  Pharmacy Consult for Argatroban Indication: Hx of VTE  Allergies  Allergen Reactions  . Tramadol Palpitations    "Made my chest feel tight"  . Amoxicillin Rash  . Penicillins Rash    Tolerates Rocephin Jan 2018.  TDD.  Has patient had a PCN reaction causing immediate rash, facial/tongue/throat swelling, SOB or lightheadedness with hypotension: unknown Has patient had a PCN reaction causing severe rash involving mucus membranes or skin necrosis: unknown Has patient had a PCN reaction that required hospitalization: no  Has patient had a PCN reaction occurring within the last 10 years: no  If all of the above answers are "NO", then may proceed with Cephalosporin use.    Patient Measurements: Height: '5\' 8"'$  (172.7 cm) Weight: 204 lb 2.3 oz (92.6 kg) IBW/kg (Calculated) : 68.4  Vital Signs: Temp: 97.5 F (36.4 C) (01/10 1100) Temp Source: Axillary (01/10 1100) BP: 101/73 (01/10 1100) Pulse Rate: 98 (01/10 1100)  Labs:  Recent Labs  07/18/16 0656 07/19/16 0245 07/19/16 1157 07/20/16 0216 07/20/16 1207  HGB 8.1* 8.2* 8.7* 8.1*  --   HCT 24.2* 24.9* 26.9* 24.9*  --   PLT 38* 34* 75* 59*  --   APTT 61* 58*  --  48* 63*  CREATININE 0.66 0.70  --  0.85  --     Estimated Creatinine Clearance: 82.9 mL/min (by C-G formula based on SCr of 0.85 mg/dL).   Assessment: 76 y/o M with metastatic NSCLC, was on argatroban in past due to concern for HIT, SRA was negative last month,. S/p exlap with small bowel resection on 07/19/16. Argatroban resumed on 1/10 at 0100.   APTT now therapeutic at recent dose increase of Argatroban. CBC stable.    Goal of Therapy:  aPTT 50-90  seconds Monitor platelets by anticoagulation protocol: Yes   Plan:  1. Continue argatroban at 1.1 mcg/kg/min or 6.1 ml/hr 2. Obtain 1600 aPTT for confirmation  3. Follow up long term anticoagulation plans; ? Switch to Arixtra or resume Xarelto if decreased  concern for bleeding exist   Vincenza Hews, PharmD, BCPS 07/20/2016, 1:51 PM Pager: [phone]

## 2016-07-20 NOTE — Clinical Social Work Note (Signed)
Clinical Social Work Assessment  Patient Details  Name: [name] MRN: [id number] Date of Birth: [birth year]  Date of referral:  07/20/16               Reason for consult:  Facility Placement, Discharge Planning                Permission sought to share information with:  Facility Sport and exercise psychologist, Family Supports Permission granted to share information::  Yes, Verbal Permission Granted  Name::     Braxen Dobek  Agency::  SNF's  Relationship::  Wife  Contact Information:  [phone]  Housing/Transportation Living arrangements for the past 2 months:  [place] of Information:  Patient, Medical Team, Spouse Patient Interpreter Needed:  None Criminal Activity/Legal Involvement Pertinent to Current Situation/Hospitalization:  No - Comment as needed Significant Relationships:  Other Family Members, Spouse Lives with:  Spouse Do you feel safe going back to the place where you live?  Yes Need for family participation in patient care:  Yes (Comment)  Care giving concerns:  PT recommending CIR. CSW initiating SNF backup search.   Social Worker assessment / plan:  CSW met with patient. Spouse and two other supports at bedside. CSW introduced role and explained that PT recommendations would be discussed. Patient agreeable to SNF backup in the event he cannot return to CIR. Patient was discharged to [facility] for two hours before being readmitted. Patient's wife questioning why he would not be able to go back to CIR if he was able to go before. CSW discussed possible barriers. Patient expressed understanding. Patient's wife discussed how hard CIR worked to get him to their facility. No further concerns. CSW encouraged patient and his family to contact CSW as needed. CSW will continue to follow patient and his family for support and facilitate discharge to SNF once medically stable, if needed.  Employment status:  Health visitor PT  Recommendations:  Inpatient Rehab Consult Information / Referral to community resources:  [place]  Patient/Family's Response to care:  Patient agreeable to SNF backup plan. Patient's family supportive and involved in patient's care. Patient appreciated social work intervention.  Patient/Family's Understanding of and Emotional Response to Diagnosis, Current Treatment, and Prognosis:  Patient understands and is agreeable to discharge plan. Patient appears happy with hospital care.  Emotional Assessment Appearance:  Appears stated age Attitude/Demeanor/Rapport:  Other (Pleasant) Affect (typically observed):  Accepting, Appropriate, Calm, Pleasant Orientation:  Oriented to Self, Oriented to Place, Oriented to  Time, Oriented to Situation Alcohol / Substance use:  Never Used Psych involvement (Current and /or in the community):  No (Comment)  Discharge Needs  Concerns to be addressed:  Care Coordination Readmission within the last 30 days:  Yes Current discharge risk:  Dependent with Mobility Barriers to Discharge:  Continued Medical Work up   Candie Chroman, LCSW 07/20/2016, 2:18 PM

## 2016-07-20 NOTE — Progress Notes (Signed)
PROGRESS NOTE  [name] NWG:[id number] DOB: 09-Jul-1941 DOA: 07/13/2016 PCP: [name], MD   LOS: 7 days   Brief Narrative: 76 year old man with history significant for MM and stage IV NSCLC now with mets to brain discovered 04/2016 undergoing radiotherapy to brain lesions and Ketruda, pulmonary embolism on Xarelto. He has been admitted several times in the past few months, initially for SBO back in October for SBO, which resolved with conservative therapy. He was deemed a poor surgical candidate, but it was felt that if he could regain some strength he could undergo surgery. He was admitted again 12/15 with projectile vomiting and SOB. He was then found to have GNR bacteremia. Course became complicated by shock and DVT while he was off DOAC for colitis related GIB. IVC filter placed. He was started on TPN 1/2 and transferred to CIR 1/3. Later that day he developed vomiting again where and was suspected to have aspirated developing hypoxia and significant respiratory distress. Patient admitted with Recurrent SBO and acute hypoxic respiratory failure secondary to aspiration PNA. He has been receiving TPN, IV antibiotics. He still has NG tube, surgery has been following and patient is s/p laparoscopic LOA 1/9  Assessment & Plan: Active Problems:   Primary malignant neoplasm of lung metastatic to other site [facility])   Pressure injury of skin   Aspiration into airway   Goals of care, counseling/discussion   Palliative care by specialist   Recurrent SBO - Gen. surgery was consulted, patient was taken to the operating room on 1/9 and he is now status post laparoscopic lysis of adhesions. Continue parenteral nutrition, appreciate surgical consultation  Acute hypoxic Respiratory failure, secondary to possible aspiration event / Aspiration pneumonitis versus pneumonia - suplemmental oxygen, stable at 94 % on 4 l.  - On IV ceftriaxone and flagyl, continue for today, likely discontinue tomorrow as  he had completed 8 days  NSCLC (80% PDL1+)-->treating w/ Keytruda > - Dr. [name] is primary oncologist H/o Multiple Myeloma w/ skull lesion s/p XRT 2016 in remission.   H/o PE and DVT on Eliquis-->new DVTs on NOAC 12/21 > S/P temp filter 12/1 - H/O HIT  - On Argatroban. Continue for now. Resume NOAC when OK with surgery  Thrombocytopenia, Acute blood loss anemia. - Now on argatroban.  - HIT panel; repeated 1-05; at 5.784  Chronic diastolic HF - Monitor volume status.   Transaminases - trending down.   Hypoalbuminemia  - Pre-albumin 18.  - Continuee with TPN.   AKI - resolved, continue to monitor  Hyponatremia - resolved.   Addison diseases - decadron, Desmopressin.   Stage 2 sacral ulcer noted 2x2 cms - Wound care consult   DVT prophylaxis: argatroban  Code Status: DNR Family Communication: no family bedside Disposition Plan: TBD  Consultants:   General surgery   Oncology   Procedures:   Laparoscopic LOA 1/9  Antimicrobials:  Ceftriaxone 1/3 >>  Flagyl 1/3 >>   Subjective: - no chest pain, shortness of breath, no abdominal pain, nausea or vomiting. Feels great today   Objective: Vitals:   07/20/16 0049 07/20/16 0350 07/20/16 0400 07/20/16 0732  BP: (!) 81/59 (!) 85/62    Pulse: 98 94    Resp: '15 16 14 18  '$ Temp: 98.7 F (37.1 C) 99 F (37.2 C)    TempSrc: Axillary Axillary    SpO2: 95% 93% 94% 92%  Weight:      Height:        Intake/Output Summary (Last 24 hours) at 07/20/16  [place] filed at 07/20/16 1100  Gross per 24 hour  Intake           3324.5 ml  Output             1550 ml  Net           1774.5 ml   Filed Weights   07/13/16 2130  Weight: 92.6 kg (204 lb 2.3 oz)    Examination: Constitutional: NAD Vitals:   07/20/16 0049 07/20/16 0350 07/20/16 0400 07/20/16 0732  BP: (!) 81/59 (!) 85/62    Pulse: 98 94    Resp: '15 16 14 18  '$ Temp: 98.7 F (37.1 C) 99 F (37.2 C)    TempSrc: Axillary Axillary      SpO2: 95% 93% 94% 92%  Weight:      Height:       Eyes: PERRL, lids and conjunctivae normal Respiratory: clear to auscultation bilaterally, no wheezing, no crackles. Cardiovascular: Regular rate and rhythm, no murmurs / rubs / gallops.  Abdomen: no tenderness. Bowel sounds decreased. Incision dressing c/d/i Musculoskeletal: no clubbing / cyanosis. Neurologic: non focal    Data Reviewed: I have personally reviewed following labs and imaging studies  CBC:  Recent Labs Lab 07/14/16 0514  07/17/16 0257 07/17/16 1834 07/18/16 0656 07/19/16 0245 07/19/16 1157 07/20/16 0216  WBC 4.4  < > 9.9  --  9.3 8.3 7.6 7.8  NEUTROABS 4.0  --   --   --  8.7*  --  7.1  --   HGB 8.2*  < > 7.0* 9.2* 8.1* 8.2* 8.7* 8.1*  HCT 25.2*  < > 21.3* 27.6* 24.2* 24.9* 26.9* 24.9*  MCV 101.6*  < > 94.2  --  91.3 92.9 92.8 94.0  PLT 35*  < > 53*  --  38* 34* 75* 59*  < > = values in this interval not displayed. Basic Metabolic Panel:  Recent Labs Lab 07/14/16 0514 07/15/16 0339 07/16/16 0448 07/17/16 0257 07/18/16 0656 07/19/16 0245 07/20/16 0216  NA 144 147* 144 142 142 140 140  K 4.0 3.4* 3.6 3.4* 3.5 4.0 3.9  CL 114* 114* 113* 112* 112* 111 111  CO2 '24 26 23 22 24 24 22  '$ GLUCOSE 216* 150* 199* 209* 186* 135* 145*  BUN 38* 37* 38* 37* 36* 31* 27*  CREATININE 1.56* 1.08 0.82 0.73 0.66 0.70 0.85  CALCIUM 7.9* 7.7* 7.6* 7.4* 7.6* 7.7* 7.3*  MG 2.1 2.1  --   --  1.7 2.0  --   PHOS 4.4 2.7 2.3*  --  2.8  --   --    GFR: Estimated Creatinine Clearance: 82.9 mL/min (by C-G formula based on SCr of 0.85 mg/dL). Liver Function Tests:  Recent Labs Lab 07/14/16 0514 07/15/16 0339 07/17/16 0257 07/18/16 0656  AST 318* 138* 48* 32  ALT 168* 151* 110* 82*  ALKPHOS 83 74 71 74  BILITOT 0.5 0.2* <0.1* 0.6  PROT 3.8* 3.8* 3.4* 3.3*  ALBUMIN 1.5* 1.4* 1.4* 1.4*   No results for input(s): LIPASE, AMYLASE in the last 168 hours. No results for input(s): AMMONIA in the last 168 hours. Coagulation  Profile: No results for input(s): INR, PROTIME in the last 168 hours. Cardiac Enzymes: No results for input(s): CKTOTAL, CKMB, CKMBINDEX, TROPONINI in the last 168 hours. BNP (last 3 results) No results for input(s): PROBNP in the last 8760 hours. HbA1C: No results for input(s): HGBA1C in the last 72 hours. CBG:  Recent Labs Lab 07/18/16 1556 07/18/16  2124 07/19/16 0835 07/19/16 2053 07/19/16 2347  GLUCAP 125* 125* 149* 101* 99   Lipid Profile:  Recent Labs  07/18/16 0656  TRIG 58   Thyroid Function Tests: No results for input(s): TSH, T4TOTAL, FREET4, T3FREE, THYROIDAB in the last 72 hours. Anemia Panel: No results for input(s): VITAMINB12, FOLATE, FERRITIN, TIBC, IRON, RETICCTPCT in the last 72 hours. Urine analysis:    Component Value Date/Time   COLORURINE YELLOW 06/29/2016 2121   APPEARANCEUR CLEAR 06/29/2016 2121   LABSPEC 1.014 06/29/2016 2121   LABSPEC 1.005 05/09/2016 0937   PHURINE 7.0 06/29/2016 2121   GLUCOSEU NEGATIVE 06/29/2016 2121   GLUCOSEU Negative 05/09/2016 0937   HGBUR NEGATIVE 06/29/2016 2121   BILIRUBINUR NEGATIVE 06/29/2016 2121   [place] Negative 05/09/2016 [place] 06/29/2016 2121   PROTEINUR NEGATIVE 06/29/2016 2121   UROBILINOGEN 0.2 05/09/2016 0937   NITRITE NEGATIVE 06/29/2016 2121   LEUKOCYTESUR NEGATIVE 06/29/2016 2121   LEUKOCYTESUR Negative 05/09/2016 0937   Radiology Studies: Dg Chest Port 1 View  Result Date: 07/19/2016 CLINICAL DATA:  Known small bowel obstruction EXAM: PORTABLE CHEST 1 VIEW COMPARISON:  07/18/2016 FINDINGS: Cardiac shadow is stable. Nasogastric catheter is noted coiled within the stomach. A right chest wall port is again seen and stable. Patchy densities are again identified bilaterally but overall improved when compare with the prior exam. No sizable effusion is seen. No acute bony abnormality is noted. IMPRESSION: Continued improved aeration bilaterally. Some patchy residual changes are  again seen. Electronically Signed   By: Inez Catalina M.D.   On: 07/19/2016 09:49   Scheduled Meds: . sodium chloride   Intravenous Once  . alteplase  2 mg Intracatheter Once  . cefTRIAXone (ROCEPHIN)  IV  2 g Intravenous Q24H  . desmopressin  10 mcg Nasal QHS  . dexamethasone  4 mg Intravenous Q12H  . fentaNYL   Intravenous Q4H  . insulin aspart  0-15 Units Subcutaneous 4 times per day  . mouth rinse  15 mL Mouth Rinse BID  . metronidazole  500 mg Intravenous Q8H  . pantoprazole (PROTONIX) IV  40 mg Intravenous Q12H   Continuous Infusions: . sodium chloride 10 mL/hr at 07/17/16 2300  . argatroban 1.1 mcg/kg/min (07/20/16 0849)  . dextrose 5 % and 0.9% NaCl 75 mL/hr at 07/19/16 2253  . Marland KitchenTPN (CLINIMIX-E) Adult 125 mL/hr at 07/20/16 0829   And  . fat emulsion 240 mL (07/20/16 0829)  . Marland KitchenTPN (CLINIMIX-E) Adult     And  . fat emulsion      Marzetta Board, MD, PhD [facility] Pager [phone]  If 7PM-7AM, please contact night-coverage [email or web address] Password [email or web address] 07/20/2016, 12:03 PM

## 2016-07-20 NOTE — Progress Notes (Signed)
Patient BP has been trending down. Patient is tachycardic and skin is cool. MD text paged.    07/20/16 0006  Vitals  BP (!) 80/66  MAP (mmHg) 71  BP Location Right Arm  BP Method Automatic  Patient Position (if appropriate) Lying  Pulse Rate (!) 107  Pulse Rate Source Monitor  ECG Heart Rate (!) 107  Cardiac Rhythm ST  Heart Block Type 1st degree AVB  Ectopy PAC  Ectopy Frequency Rare  Resp (!) 21  Oxygen Therapy  SpO2 95 %

## 2016-07-20 NOTE — Progress Notes (Signed)
Occupational Therapy Treatment Patient Details Name: [name] MRN: [id number] DOB: Jun 15, 1941 Today's Date: 07/20/2016    History of present illness Patient is a 76 y/o male with hx of recently diagnosed stage IV NSCLC (80% PDL1+) with brain mets, DVT and PEs s/p IVC filter,recent SBO, DM presents with recurrent SBO. Transferred to CIR on 1/3, but developed vomiting again and respiratory distress requiring nonrebreather and transferred back to acute care. NG tube placed due to SBO. s/p lap LOA 1/9.   OT comments  Pt eager for therapy today. Requiring extensive 2 person assist for bed mobility. Tolerated sitting EOB with min to mod assist x 5 minutes with blood pressure monitored throughout. Pt with symptomatic hypotension and abdominal pain limiting activity tolerance. 02 sats remained in the 90s on 2L.  Follow Up Recommendations  CIR    Equipment Recommendations  3 in 1 bedside commode    Recommendations for Other Services      Precautions / Restrictions Precautions Precautions: Fall Precaution Comments: monitor BP, hypotensive       Mobility Bed Mobility Overal bed mobility: Needs Assistance Bed Mobility: Rolling;Sidelying to Sit;Sit to Sidelying Rolling: +2 for physical assistance;Total assist Sidelying to sit: +2 for physical assistance;Total assist     Sit to sidelying: +2 for physical assistance;Total assist General bed mobility comments: educated in log roll technique to minimize abdominal pain, +2 total assist to pull up in bed  Transfers                      Balance Overall balance assessment: Needs assistance Sitting-balance support: Feet supported Sitting balance-Leahy Scale: Poor Sitting balance - Comments: posterior lean requiring min to mod assist                           ADL                                                Vision                     Perception     Praxis      Cognition    Behavior During Therapy: [facility] for tasks assessed/performed Overall Cognitive Status: Within Functional Limits for tasks assessed                       Extremity/Trunk Assessment               Exercises     Shoulder Instructions       General Comments      Pertinent Vitals/ Pain       Pain Assessment: 0-10 Pain Score: 6  Pain Location: abdoment Pain Descriptors / Indicators: Sore;Guarding;Grimacing Pain Intervention(s): Monitored during session;Repositioned;PCA encouraged;Limited activity within patient's tolerance (splinted with pillow)  Home Living                                          Prior Functioning/Environment              Frequency  Min 2X/week        Progress Toward Goals  OT Goals(current goals can now be found in the care plan section)  Progress towards OT goals: Not progressing toward goals - comment (pt continues to have hypotension, decreased activity toleran)  Acute Rehab OT Goals Patient Stated Goal: to go to rehab Time For Goal Achievement: 08/01/16 Potential to Achieve Goals: Good  Plan Discharge plan remains appropriate    Co-evaluation    PT/OT/SLP Co-Evaluation/Treatment: Yes Reason for Co-Treatment: For patient/therapist safety   OT goals addressed during session: Strengthening/ROM      End of Session Equipment Utilized During Treatment: Oxygen (2L)   Activity Tolerance Treatment limited secondary to medical complications (Comment) (hypotension)   Patient Left in bed;with call bell/phone within reach;with family/visitor present   Nurse Communication  (activity tolerance, work on bed in chair position)        Time: 3779-3968 OT Time Calculation (min): 27 min  Charges: OT General Charges $OT Visit: 1 Procedure OT Treatments $Therapeutic Activity: 8-22 mins  Malka So 07/20/2016, 3:51 PM  [phone]

## 2016-07-20 NOTE — Progress Notes (Signed)
PHARMACY - ADULT TOTAL PARENTERAL NUTRITION CONSULT NOTE   Pharmacy Consult:  TPN Indication:  SBO  Patient Measurements: Height: '5\' 8"'$  (172.7 cm) Weight: 204 lb 2.3 oz (92.6 kg) IBW/kg (Calculated) : 68.4 TPN AdjBW (KG): 74.5 Body mass index is 31.04 kg/m.   Assessment:  89 YOM with recent admission on 11/26-12/4/17 for SBO, re-admitted to [facility] on 06/24/16 with PE and recurrent SBO. Patient was unable to tolerate fluids and had continued nausea and vomiting.  NG tube placement failed multiple times and Pharmacy was consulted to manage TPN for nutritional support.  Patient transferred to [facility] on 07/14/15.  Per documentation, his intake was </= 75% for 1 month PTA.  Now with severe malnutrition.  Surgery was delayed because patient was too debilitated.   ZO:XWRUEAVWUJ 10.7 > 6.5> 18.5 -now WNL.  OR 07/19/16: lap LOA, removal meckel's diverticulum, small bowel resection for ? Contained perforation.  Endo: no hx DM - 20 units insulin/bag  (on Decadron '4mg'$  BID).  DDAVP for diabetes insipidus. Insulin requirements in the past 24 hours: 20 units in TPN + 2 units SSI Lytes: K+ 3.9.  *day without lytes: 1/4, 1/5, 1/6 Renal: BPH.  AKI resolved - SCr down to wnl, BUN 27,  Pulm: 4L  Cards:  not on med AC: Xarelto PTA for hx PE/DVT > transitioned to argatroban and then Eliquis, bleeding on AC so received IVC filter, then resumed Xarelto and now on argatroban again - hgb 8.1, plts 59 Onc: MM / SCC / metastatic lung cancer on immunotherapy with Ketruda (on hold) Hepatobil: AST/ALT improving, alk phos and tbili WNL.  TG WNL Neuro: DJD - A&O ID: CTX/Flagyl for colitis - now afebrile, WBC WNL Best Practices: argatroban TPN Access: right CVC port placed 10/30/12 TPN start date: 07/09/16  Nutritional Goals: 2150-2350 kCal and 115-125 gm protein per day  Current Nutrition:  TPN not hung last PM (pt in OR) hung this am at 0830 at 125 ml/hr x 10 hrs   Plan:  - Continue cycle at 14 hrs: Clinimix E  5/15 (with lytes) infuse 1992 mls over 14 hours: 50 ml/hr x 1 hr, then 158 ml/hr x 12 hrs, then 50 ml/hr x 1 hr - Lipids at 17 ml/hr x 14 hours (total 240 ml/day) - TPN will provide 1894 kCal and 100 gm of protein per day, meeting 88% of kCal and 87% of protein needs.  Capping TPN at this rate d/t critical Clinimix shortage. - Daily multivitamin and trace elements in TPN - Continue moderate SSI to q6h (09:00, 16:00, 20:00, 23:00) and keep 20 units of regular insulin in TPN - F/U AM labs  Eudelia Bunch, Pharm.D. [phone] 07/20/2016 10:15 AM

## 2016-07-20 NOTE — Progress Notes (Signed)
Patients BP in the 80's paged Dr. Cruzita Lederer, MD updated on patients change in condition.  Will continue to monitor.

## 2016-07-21 LAB — APTT: APTT: 65 s — AB (ref 24–36)

## 2016-07-21 LAB — CBC
HEMATOCRIT: 21.7 % — AB (ref 39.0–52.0)
Hemoglobin: 7 g/dL — ABNORMAL LOW (ref 13.0–17.0)
MCH: 30.8 pg (ref 26.0–34.0)
MCHC: 32.3 g/dL (ref 30.0–36.0)
MCV: 95.6 fL (ref 78.0–100.0)
PLATELETS: 38 10*3/uL — AB (ref 150–400)
RBC: 2.27 MIL/uL — ABNORMAL LOW (ref 4.22–5.81)
RDW: 21.6 % — AB (ref 11.5–15.5)
WBC: 9.5 10*3/uL (ref 4.0–10.5)

## 2016-07-21 LAB — MAGNESIUM: Magnesium: 1.8 mg/dL (ref 1.7–2.4)

## 2016-07-21 LAB — GLUCOSE, CAPILLARY
GLUCOSE-CAPILLARY: 110 mg/dL — AB (ref 65–99)
GLUCOSE-CAPILLARY: 122 mg/dL — AB (ref 65–99)
GLUCOSE-CAPILLARY: 149 mg/dL — AB (ref 65–99)
GLUCOSE-CAPILLARY: 162 mg/dL — AB (ref 65–99)
Glucose-Capillary: 193 mg/dL — ABNORMAL HIGH (ref 65–99)
Glucose-Capillary: 150 mg/dL — ABNORMAL HIGH (ref 65–99)
Glucose-Capillary: 94 mg/dL (ref 65–99)

## 2016-07-21 LAB — COMPREHENSIVE METABOLIC PANEL
ALBUMIN: 1.4 g/dL — AB (ref 3.5–5.0)
ALT: 31 U/L (ref 17–63)
AST: 16 U/L (ref 15–41)
Alkaline Phosphatase: 64 U/L (ref 38–126)
Anion gap: 6 (ref 5–15)
BILIRUBIN TOTAL: 0.4 mg/dL (ref 0.3–1.2)
BUN: 24 mg/dL — ABNORMAL HIGH (ref 6–20)
CHLORIDE: 114 mmol/L — AB (ref 101–111)
CO2: 21 mmol/L — ABNORMAL LOW (ref 22–32)
Calcium: 7.5 mg/dL — ABNORMAL LOW (ref 8.9–10.3)
Creatinine, Ser: 0.55 mg/dL — ABNORMAL LOW (ref 0.61–1.24)
GFR calc Af Amer: >60 mL/min (ref >60)
GFR calc non Af Amer: >60 mL/min (ref >60)
GLUCOSE: 197 mg/dL — AB (ref 65–99)
POTASSIUM: 3.8 mmol/L (ref 3.5–5.1)
Sodium: 141 mmol/L (ref 135–145)
TOTAL PROTEIN: 3.5 g/dL — AB (ref 6.5–8.1)

## 2016-07-21 LAB — PREPARE RBC (CROSSMATCH)

## 2016-07-21 LAB — PHOSPHORUS: Phosphorus: 2.3 mg/dL — ABNORMAL LOW (ref 2.5–4.6)

## 2016-07-21 MED ORDER — MAGNESIUM SULFATE 2 GM/50ML IV SOLN
2.0000 g | Freq: Once | INTRAVENOUS | Status: AC
  Administered 2016-07-21: 2 g via INTRAVENOUS
  Filled 2016-07-21: qty 50

## 2016-07-21 MED ORDER — POTASSIUM PHOSPHATES 15 MMOLE/5ML IV SOLN
10.0000 mmol | Freq: Once | INTRAVENOUS | Status: AC
  Administered 2016-07-21: 10 mmol via INTRAVENOUS
  Filled 2016-07-21: qty 3.33

## 2016-07-21 MED ORDER — FAT EMULSION 20 % IV EMUL
240.0000 mL | INTRAVENOUS | Status: AC
  Administered 2016-07-21: 240 mL via INTRAVENOUS
  Filled 2016-07-21: qty 250

## 2016-07-21 MED ORDER — SODIUM CHLORIDE 0.9 % IV SOLN
Freq: Once | INTRAVENOUS | Status: AC
  Administered 2016-07-21: 11:00:00 via INTRAVENOUS

## 2016-07-21 MED ORDER — TRACE MINERALS CR-CU-MN-SE-ZN 10-1000-500-60 MCG/ML IV SOLN
INTRAVENOUS | Status: AC
  Administered 2016-07-21: 18:00:00 via INTRAVENOUS
  Filled 2016-07-21 (×2): qty 1992

## 2016-07-21 NOTE — Progress Notes (Signed)
Initial Nutrition Assessment  DOCUMENTATION CODES:   Severe malnutrition in context of chronic illness  INTERVENTION:   TPN per Pharmacy  NUTRITION DIAGNOSIS:   Malnutrition (Severe) related to chronic illness as evidenced by energy intake < or equal to 75% for > or equal to 1 month, severe depletion of muscle mass, severe fluid accumulation.  Ongoing  GOAL:   Patient will meet greater than or equal to 90% of their needs  Met  MONITOR:   I & O's, Labs, Skin  ASSESSMENT:   Pt with PMH of MM and stage IV NSCLC now with mets to brain discovered 04/2016 undergoing XRT to brain lesions and Nat Math, PE on Xarelto. He has been admitted several times in the past few months, initially for SBO back in October, which resolved with conservative therapy. He was deemed a poor surgical candidate, but it was felt that if he could regain some strength he could undergo surgery. He was admitted again 12/15 with projectile vomiting and SOB. He was then found to have GNR bacteremia. Course became complicated by shock and DVT while he was off DOAC for colitis related GIB. IVC filter placed. He was started on TPN 1/2 and transferred to CIR 1/3. Later that day he developed vomiting again where and was suspected to have aspirated developing hypoxia and significant respiratory distress.    S/P laparoscopic LOA on 1/9. NGT is clamped. Patient remains NPO. Awaiting return of bowel function before starting diet advancement per discussion with RN. Cyclic TPN: Clinimix E 1/00 1992 ml over 14 hours, lipids @ 17 ml x 14 hours. Provides: 1894 kcal (88% of minimum needs) and 100 grams protein (87% of minimum needs). Labs reviewed: phosphorus 2.3 CBG's: 193-150-94  Diet Order:  Diet NPO time specified Except for: Ice Chips, Sips with Meds TPN (CLINIMIX-E) Adult  Skin:  Wound (see comment) (Non-pressure R foot wound, stage II sacrum)  Last BM:  1/3  Height:   Ht Readings from Last 1 Encounters:  07/13/16  _0  (1.727 m)    Weight:   Wt Readings from Last 1 Encounters:  07/13/16 204 lb 2.3 oz (92.6 kg)    Ideal Body Weight:  70 kg  BMI:  Body mass index is 31.04 kg/m.  Estimated Nutritional Needs:   Kcal:  2150-2350  Protein:  115-125  Fluid:  > 2 L/day  EDUCATION NEEDS:   No education needs identified at this time  Molli Barrows, [place], [place], [place] Pager [phone] After Hours Pager [phone]

## 2016-07-21 NOTE — Progress Notes (Signed)
PHARMACY - ADULT TOTAL PARENTERAL NUTRITION CONSULT NOTE   Pharmacy Consult:  TPN Indication:  SBO  Patient Measurements: Height: '5\' 8"'$  (172.7 cm) Weight: 204 lb 2.3 oz (92.6 kg) IBW/kg (Calculated) : 68.4 TPN AdjBW (KG): 74.5 Body mass index is 31.04 kg/m.   Assessment:  38 YOM with recent admission on 11/26-12/4/17 for SBO, re-admitted to [facility] on 06/24/16 with PE and recurrent SBO. Patient was unable to tolerate fluids and had continued nausea and vomiting.  NG tube placement failed multiple times and Pharmacy was consulted to manage TPN for nutritional support.  Patient transferred to [facility] on 07/14/15.  Per documentation, his intake was </= 75% for 1 month PTA.  Now with severe malnutrition.  Surgery was delayed because patient was too debilitated.   YT:RZNBVAPOLI 10.7 > 6.5> 18.5 -now WNL.  OR 07/19/16: lap LOA, removal meckel's diverticulum, small bowel resection for ? Contained perforation.Last BM 07/13/16  Endo: no hx DM - 20 units insulin/bag  (on Decadron '4mg'$  BID).  DDAVP for diabetes insipidus. CGS 149 - 226 during TPN infusion Insulin requirements in the past 24 hours: 20 units in TPN + 10 units SSI Lytes: K+ 3.8, phos 2.3, mag 1.8 *day without lytes: 1/4, 1/5, 1/6 Renal: BPH.  AKI resolved - SCr down to wnl, BUN 24, D5NS at 75 ml/hr and NS at 10 ml/hr,  UOP 0.9 ml/kg/hr, + 3340 ml Pulm: 4L [place] Cards:  not on med AC: Xarelto PTA for hx PE/DVT > transitioned to argatroban and then Eliquis, bleeding on AC so received IVC filter, then resumed Xarelto and now on argatroban again - hgb 7, plts 65 Onc: MM / SCC / metastatic lung cancer on immunotherapy with Ketruda (on hold) Hepatobil: AST/ALT WNL, alk phos and tbili WNL.  TG WNL Neuro: DJD - A&O ID: CTX/Flagyl for colitis - afebrile, WBC WNL Best Practices: argatroban TPN Access: right CVC port placed 10/30/12 TPN start date: 07/09/16  Nutritional Goals: 2150-2350 kCal and 115-125 gm protein per day  Current Nutrition:  Cyclic  TPN over 14 hrs   Plan:  - Continue cycle at 14 hrs: Clinimix E 5/15 (with lytes) infuse 1992 mls over 14 hours: 50 ml/hr x 1 hr, then 158 ml/hr x 12 hrs, then 50 ml/hr x 1 hr - Lipids at 17 ml/hr x 14 hours (total 240 ml/day) - TPN will provide 1894 kCal and 100 gm of protein per day, meeting 88% of kCal and 87% of protein needs.  Capping TPN at this rate d/t critical Clinimix shortage. - DC IVF D5NS at 75 ml/hr, continue NS at 10 ml/hr - 2 gm mag, 10 mM kphos for replacement - Daily multivitamin and trace elements in TPN - Continue moderate SSI to q6h (09:00, 16:00, 20:00, 23:00) and increase to 25 units of regular insulin in TPN - F/U AM labs  Eudelia Bunch, Pharm.D. [phone] 07/21/2016 10:18 AM

## 2016-07-21 NOTE — Progress Notes (Signed)
ANTICOAGULATION CONSULT NOTE - Follow Up Consult  Pharmacy Consult for Argatroban Indication: Hx of VTE  Allergies  Allergen Reactions  . Tramadol Palpitations    "Made my chest feel tight"  . Amoxicillin Rash  . Penicillins Rash    Tolerates Rocephin Jan 2018.  TDD.  Has patient had a PCN reaction causing immediate rash, facial/tongue/throat swelling, SOB or lightheadedness with hypotension: unknown Has patient had a PCN reaction causing severe rash involving mucus membranes or skin necrosis: unknown Has patient had a PCN reaction that required hospitalization: no  Has patient had a PCN reaction occurring within the last 10 years: no  If all of the above answers are "NO", then may proceed with Cephalosporin use.    Patient Measurements: Height: '5\' 8"'$  (172.7 cm) Weight: 204 lb 2.3 oz (92.6 kg) IBW/kg (Calculated) : 68.4  Vital Signs: Temp: 98.4 F (36.9 C) (01/11 0818) Temp Source: Axillary (01/11 0818) BP: 99/69 (01/11 0818) Pulse Rate: 95 (01/11 0818)  Labs:  Recent Labs  07/19/16 0245 07/19/16 1157 07/20/16 0216 07/20/16 1207 07/20/16 1448 07/21/16 0430  HGB 8.2* 8.7* 8.1*  --   --  7.0*  HCT 24.9* 26.9* 24.9*  --   --  21.7*  PLT 34* 75* 59*  --   --  38*  APTT 58*  --  48* 63* 66* 65*  CREATININE 0.70  --  0.85  --   --  0.55*    Estimated Creatinine Clearance: 88.1 mL/min (by C-G formula based on SCr of 0.55 mg/dL (L)).   Assessment: 76 y/o M with metastatic NSCLC, was on argatroban in past due to concern for HIT, SRA was negative.  S/p exlap with small bowel resection on 1/9. Argatroban resumed on 1/10 at 0100.   APTT remains therapeutic this morning.  Hgb and platelets have trended down over last 24 hours.    Goal of Therapy:  aPTT 50-90  seconds Monitor platelets by anticoagulation protocol: Yes   Plan:  1. Continue argatroban at 1.1 mcg/kg/min or 6.1 ml/hr 2. Daily aPTT for monitoring  3. Follow up long term anticoagulation plans; ? Switch to  Arixtra if no concern for bleeding and remains on TPN.   Vincenza Hews, PharmD, BCPS 07/21/2016, 8:30 AM Pager: [phone]

## 2016-07-21 NOTE — Progress Notes (Signed)
Patient ID: [name], male   DOB: [birth year], 76 y.o.   MRN: [id number]  [facility] Surgery Progress Note  2 Days Post-Op  Subjective: Doing well. Pain well controlled on PCA. No flatus or BM yet. Worked with PT yesterday and got up to side of bed. Receiving 1 u PRBC this AM  Objective: Vital signs in last 24 hours: Temp:  [97.4 F (36.3 C)-98.4 F (36.9 C)] 98.4 F (36.9 C) (01/11 0818) Pulse Rate:  [94-108] 95 (01/11 0818) Resp:  [13-26] 19 (01/11 0818) BP: (90-117)/(65-86) 99/69 (01/11 0818) SpO2:  [93 %-99 %] 97 % (01/11 0818) Last BM Date: 07/13/16  Intake/Output from previous day: 01/10 0701 - 01/11 0700 In: 5365.7 [I.V.:4915.7; IV Piggyback:450] Out: 2025 [Urine:2025] Intake/Output this shift: Total I/O In: 1155.7 [I.V.:1155.7] Out: 400 [Urine:400]  PE: Gen:  Alert, NAD, pleasant Pulm:  CTA, no W/R/R Abd: Soft, ND, appropriately tender, hypoactive BS, midline incision C/D/I with staples intact and honeycomb dressing in place  Lab Results:   Recent Labs  07/20/16 0216 07/21/16 0430  WBC 7.8 9.5  HGB 8.1* 7.0*  HCT 24.9* 21.7*  PLT 59* 38*   BMET  Recent Labs  07/20/16 0216 07/21/16 0430  NA 140 141  K 3.9 3.8  CL 111 114*  CO2 22 21*  GLUCOSE 145* 197*  BUN 27* 24*  CREATININE 0.85 0.55*  CALCIUM 7.3* 7.5*   PT/INR No results for input(s): LABPROT, INR in the last 72 hours. CMP     Component Value Date/Time   NA 141 07/21/2016 0430   NA 132 (L) 06/15/2016 0852   K 3.8 07/21/2016 0430   K 4.1 06/15/2016 0852   CL 114 (H) 07/21/2016 0430   CL 109 (H) 12/31/2012 0759   CO2 21 (L) 07/21/2016 0430   CO2 23 06/15/2016 0852   GLUCOSE 197 (H) 07/21/2016 0430   GLUCOSE 101 06/15/2016 0852   GLUCOSE 116 (H) 12/31/2012 0759   BUN 24 (H) 07/21/2016 0430   BUN 19.0 06/15/2016 0852   CREATININE 0.55 (L) 07/21/2016 0430   CREATININE 0.8 06/15/2016 0852   CALCIUM 7.5 (L) 07/21/2016 0430   CALCIUM 8.5 06/15/2016 0852   PROT 3.5 (L)  07/21/2016 0430   PROT 5.6 (L) 06/15/2016 0852   ALBUMIN 1.4 (L) 07/21/2016 0430   ALBUMIN 2.8 (L) 06/15/2016 0852   AST 16 07/21/2016 0430   AST 10 06/15/2016 0852   ALT 31 07/21/2016 0430   ALT 27 06/15/2016 0852   ALKPHOS 64 07/21/2016 0430   ALKPHOS 77 06/15/2016 0852   BILITOT 0.4 07/21/2016 0430   BILITOT 0.99 06/15/2016 0852   GFRNONAA >60 07/21/2016 0430   GFRAA >60 07/21/2016 0430   Lipase     Component Value Date/Time   LIPASE 21 06/24/2016 1622       Studies/Results: No results found.  Anti-infectives: Anti-infectives    Start     Dose/Rate Route Frequency Ordered Stop   07/19/16 0800  cefoTEtan (CEFOTAN) 1 g in dextrose 5 % 50 mL IVPB     1 g 100 mL/hr over 30 Minutes Intravenous To [facility] 07/18/16 1112 07/19/16 1518   07/14/16 2200  cefTRIAXone (ROCEPHIN) 2 g in dextrose 5 % 50 mL IVPB     2 g 100 mL/hr over 30 Minutes Intravenous Every 24 hours 07/14/16 0915     07/13/16 2200  cefTRIAXone (ROCEPHIN) 1 g in dextrose 5 % 50 mL IVPB  Status:  Discontinued     1  g 100 mL/hr over 30 Minutes Intravenous Every 24 hours 07/13/16 2132 07/14/16 0915   07/13/16 2200  metroNIDAZOLE (FLAGYL) IVPB 500 mg     500 mg 100 mL/hr over 60 Minutes Intravenous Every 8 hours 07/13/16 2132         Assessment/Plan Recurrent SBO S/p Lap assisted lysis of adhesions, small bowel resection, and laparoscopic resection of Meckel's diverticulum 1/9 Dr. Barry Dienes - POD 2 - path pending - NG clamped - WBC 9.5, afebrile - no flatus  Acute hypoxic respiratory failure secondary to possible aspiration event / Aspiration pneumonitis versus pneumonia- supplemental O2, stable Thrombocytopenia, ABLA - Hg 7.0 today from 8.1 yesterday. Receiving 1 uPRBC today. NSCLC- being treated with Keytruda, Dr. [name] Multiple myeloma with skull lesion - in remission Hx PE on Eliquis, new DVT on NOAC s/p filter - held, h/o HIT, on argatroban  Stage 2 sacral ulcer - per wound care  ID  - ceftriaxone 1/3>>, flagyl 1/3 FEN - NGT/NPO except ice chips, IVF, TPN VTE - argatroban  Code status - DNR  Plan - awaiting return in bowel function. Continue NGT (clamped), NPO, TPN. Transfusions per internal medicine.   LOS: 8 days    [name] , [facility] Surgery 07/21/2016, 10:24 AM Pager: [phone] Consults: [phone] Mon-Fri 7:00 am-4:30 pm Sat-Sun 7:00 am-11:30 am

## 2016-07-21 NOTE — Progress Notes (Signed)
Physical Therapy Treatment Patient Details Name: [name] MRN: [id number] DOB: 1941/03/10 Today's Date: 07/21/2016    History of Present Illness Patient is a 76 y/o male with hx of recently diagnosed stage IV NSCLC (80% PDL1+) with brain mets, DVT and PEs s/p IVC filter,recent SBO, DM presents with recurrent SBO. Transferred to CIR on 1/3, but developed vomiting again and respiratory distress requiring nonrebreather and transferred back to acute care. NG tube placed due to SBO. s/p lap LOA 1/9.    PT Comments    Pt making gradual progress with PT intervention with increased sitting tolerance today and completed standing at EOB with +2 HHA and mod assistance. Pt continue to fatigue quickly during session. BP dropping from 104/78 supine to 86/66 sitting but increasing to 95/78 with second sitting reading. PT to continue to follow and progress as tolerated.   Follow Up Recommendations  CIR     Equipment Recommendations  None recommended by PT    Recommendations for Other Services       Precautions / Restrictions Precautions Precautions: Fall Restrictions Weight Bearing Restrictions: No    Mobility  Bed Mobility Overal bed mobility: Needs Assistance Bed Mobility: Supine to Sit;Sit to Supine     Supine to sit: +2 for physical assistance;Min assist Sit to supine: +2 for physical assistance;Mod assist   General bed mobility comments: assist with LEs and trunk, decreased complaints of abdominal pain.   Transfers Overall transfer level: Needs assistance Equipment used: 2 person hand held assist Transfers: Sit to/from Stand Sit to Stand: +2 physical assistance;Mod assist         General transfer comment: HHA X2 from EOB. Encouraging erect posture but unable to achieve with pelvis remaining posterior. Able to stand EOB approx. 30-60 seconds.   Ambulation/Gait                 Stairs            Wheelchair Mobility    Modified Rankin (Stroke Patients  Only)       Balance Overall balance assessment: Needs assistance Sitting-balance support: Feet supported Sitting balance-Leahy Scale: Poor Sitting balance - Comments: tendency for leaning to Rt side, encourging neutral position. Physical support provided with brief periods of unsupported sitting.    Standing balance support: Bilateral upper extremity supported Standing balance-Leahy Scale: Poor Standing balance comment: requiring +2 physical assist                    Cognition Arousal/Alertness: Awake/alert Behavior During Therapy: WFL for tasks assessed/performed Overall Cognitive Status: Within Functional Limits for tasks assessed                      Exercises General Exercises - Lower Extremity Ankle Circles/Pumps: AROM;Both;10 reps Long Arc Quad: Strengthening;Both;10 reps    General Comments General comments (skin integrity, edema, etc.): BP104/78 supine, 86/66 sitting, 95/78 sitting second reading. SpO2 remaining in 90s on 2L O2.       Pertinent Vitals/Pain Pain Assessment: Faces Faces Pain Scale: Hurts little more Pain Location: abdomen Pain Descriptors / Indicators: Sore;Guarding;Grimacing Pain Intervention(s): Limited activity within patient's tolerance;Monitored during session;PCA encouraged    Home Living                      Prior Function            PT Goals (current goals can now be found in the care plan section) Acute Rehab PT Goals Patient  Stated Goal: try to stand PT Goal Formulation: With patient Time For Goal Achievement: 08/01/16 Potential to Achieve Goals: Good Progress towards PT goals: Progressing toward goals    Frequency    Min 3X/week      PT Plan Current plan remains appropriate    Co-evaluation             End of Session Equipment Utilized During Treatment: Gait belt;Oxygen Activity Tolerance: Patient limited by fatigue Patient left: in bed;with call bell/phone within reach;with family/visitor  present     Time: 0158-6825 PT Time Calculation (min) (ACUTE ONLY): 37 min  Charges:  $Therapeutic Activity: 23-37 mins                    G Codes:      Cassell Clement, PT, CSCS Pager [phone] Office [phone]  07/21/2016, 3:46 PM

## 2016-07-21 NOTE — Progress Notes (Signed)
[name], Hector DOA: 07/13/2016 PCP: [name], [name]   Brief Narrative: 76 year old man with history significant for MM and stage IV NSCLC now with mets to brain discovered 04/2016 undergoing radiotherapy to brain lesions and Ketruda, pulmonary embolism on Xarelto. He has been admitted several times in the past few months, initially for SBO back in October for SBO, which resolved with conservative therapy. He was deemed a poor surgical candidate, but it was felt that if he could regain some strength he could undergo surgery. He was admitted again 12/15 with projectile vomiting and SOB. He was then found to have GNR bacteremia. Course became complicated by shock and DVT while he was off DOAC for colitis related GIB. IVC filter placed. He was started on TPN 1/2 and transferred to CIR 1/3. Later that day he developed vomiting again where and was suspected to have aspirated developing hypoxia and significant respiratory distress. Patient admitted with Recurrent SBO and acute hypoxic respiratory failure secondary to aspiration PNA. He has been receiving TPN, IV antibiotics. He still has NG tube, surgery has been following and patient is s/p laparoscopic LOA 1/9  Assessment & Plan: Active Problems:   Primary malignant neoplasm of lung metastatic to other site [facility])   Pressure injury of skin   Aspiration into airway   Goals of care, counseling/discussion   Palliative care by specialist   Recurrent SBO - Gen. surgery was consulted, patient was taken to the operating room on 1/9 and he is now status post laparoscopic lysis of adhesions. Continue parenteral nutrition, appreciate surgical consultation - awaiting bowel function   Acute hypoxic Respiratory failure, secondary to possible aspiration event / Aspiration pneumonitis versus pneumonia - suplemmental oxygen as needed to keep O2 sats > 88% - On IV ceftriaxone and flagyl, discontinue today  as he had completed 8 [name]  NSCLC (80% PDL1+)-->treating w/ Keytruda > - Dr. Julien [name] is primary oncologist H/o Multiple Myeloma w/ skull lesion s/p XRT 2016 in remission.   H/o PE and DVT on Eliquis-->new DVTs on NOAC 12/21 > S/P temp filter 12/1 - H/O HIT  - On Argatroban. Continue for now. Resume NOAC when OK with surgery and able to have po medication. Now NPO  Thrombocytopenia, acute blood loss anemia. - Now on argatroban.  - HIT panel; repeated 1-05; at 0.248 - Hb 7.0 this morning, transfuse 1U pRBC. No bleeding noted  Chronic diastolic HF - Monitor volume status.   Transaminases - trending down.   Hypoalbuminemia  - Pre-albumin 18 - Continuee with TPN.   AKI - resolved, continue to monitor  Hyponatremia - resolved.   Addison diseases - decadron, Desmopressin.   Stage 2 sacral ulcer noted 2x2 cms - Wound care consult   DVT prophylaxis: argatroban  Code Status: DNR Family Communication: no family bedside Disposition Plan: TBD  Consultants:   General surgery   Oncology   Procedures:   Laparoscopic LOA 1/9  Antimicrobials:  Ceftriaxone 1/3 >>  Flagyl 1/3 >>   Subjective: - no chest pain, shortness of breath, no abdominal pain, nausea or vomiting. Feels great today   Objective: Vitals:   [name]/11/18 0818 [name]/11/18 1040 [name]/11/18 1113 [name]/11/18 1117  BP: 99/69 90/68  105/76  Pulse: 95 86  87  Resp: '19 16 18 20  '$ Temp: 98.4 F (36.9 C) 97.4 F (36.3 C)  97.1 F (36.2 C)  TempSrc: Axillary Axillary  Axillary  SpO2: 97% 98% 99% 98%  Weight:  Height:        Intake/Output Summary (Last 24 hours) at [name]/11/18 1255 Last data filed at [name]/11/18 1141  Gross per 24 hour  Intake          6479.46 ml  Output             2275 ml  Net          4204.46 ml   Filed Weights   [name]/03/18 2130  Weight: 92.6 kg (204 lb 2.3 oz)    Examination: Constitutional: NAD Vitals:   [name]/11/18 0818 [name]/11/18 1040 [name]/11/18 1113 [name]/11/18 1117  BP: 99/69 90/68   105/76  Pulse: 95 86  87  Resp: '19 16 18 20  '$ Temp: 98.4 F (36.9 C) 97.4 F (36.3 C)  97.1 F (36.2 C)  TempSrc: Axillary Axillary  Axillary  SpO2: 97% 98% 99% 98%  Weight:      Height:       Eyes: PERRL, lids and conjunctivae normal Respiratory: clear to auscultation bilaterally, no wheezing, no crackles. Cardiovascular: Regular rate and rhythm, no murmurs / rubs / gallops. 3+ pitting LE edema Abdomen: no tenderness. Bowel sounds decreased. Incision dressing c/d/i Musculoskeletal: no clubbing / cyanosis. Neurologic: non focal    Data Reviewed: I have personally reviewed following labs and imaging studies  CBC:  Recent Labs Lab [name]/08/18 0656 [name]/09/18 0245 [name]/09/18 1157 [name]/10/18 0216 [name]/11/18 0430  WBC 9.3 8.3 7.6 7.8 9.5  NEUTROABS 8.7*  --  7.1  --   --   HGB 8.1* 8.2* 8.7* 8.1* 7.0*  HCT 24.2* 24.9* 26.9* 24.9* 21.7*  MCV 91.3 92.9 92.8 94.0 95.6  PLT 38* 34* 75* 59* 38*   Basic Metabolic Panel:  Recent Labs Lab [name]/05/18 0339 [name]/06/18 0448 [name]/07/18 0257 [name]/08/18 0656 [name]/09/18 0245 [name]/10/18 0216 [name]/11/18 0430  NA 147* 144 142 142 140 140 141  K 3.4* 3.6 3.4* 3.5 4.0 3.9 3.8  CL 114* 113* 112* 112* 111 111 114*  CO2 '26 23 22 24 24 22 '$ 21*  GLUCOSE 150* 199* 209* 186* 135* 145* 197*  BUN 37* 38* 37* 36* 31* 27* 24*  CREATININE 1.08 0.82 0.73 0.66 0.70 0.85 0.55*  CALCIUM 7.7* 7.6* 7.4* 7.6* 7.7* 7.3* 7.5*  MG 2.1  --   --  1.7 2.0  --  1.8  PHOS 2.7 2.3*  --  2.8  --   --  2.3*   GFR: Estimated Creatinine Clearance: 88.1 mL/min (by C-G formula based on SCr of 0.55 mg/dL (L)). Liver Function Tests:  Recent Labs Lab [name]/05/18 0339 [name]/07/18 0257 [name]/08/18 0656 [name]/11/18 0430  AST 138* 48* 32 16  ALT 151* 110* 82* 31  ALKPHOS 74 71 74 64  BILITOT 0.2* <0.1* 0.6 0.4  PROT 3.8* 3.4* 3.3* 3.5*  ALBUMIN 1.4* 1.4* 1.4* 1.4*   No results for input(s): LIPASE, AMYLASE in the last 168 hours. No results for input(s): AMMONIA in the last 168  hours. Coagulation Profile: No results for input(s): INR, PROTIME in the last 168 hours. Cardiac Enzymes: No results for input(s): CKTOTAL, CKMB, CKMBINDEX, TROPONINI in the last 168 hours. BNP (last 3 results) No results for input(s): PROBNP in the last 8760 hours. HbA1C: No results for input(s): HGBA1C in the last 72 hours. CBG:  Recent Labs Lab [name]/10/18 2014 [name]/11/18 0016 [name]/11/18 0317 [name]/11/18 0819 [name]/11/18 1138  GLUCAP 171* 149* 193* 150* 94   Lipid Profile: No results for input(s): CHOL, HDL, LDLCALC, TRIG, CHOLHDL, LDLDIRECT in the last 72 hours. Thyroid  Function Tests: No results for input(s): TSH, T4TOTAL, FREET4, T3FREE, THYROIDAB in the last 72 hours. Anemia Panel: No results for input(s): VITAMINB12, FOLATE, FERRITIN, TIBC, IRON, RETICCTPCT in the last 72 hours. Urine analysis:    Component Value Date/Time   COLORURINE YELLOW 06/29/2016 2121   APPEARANCEUR CLEAR 06/29/2016 2121   LABSPEC 1.014 06/29/2016 2121   LABSPEC 1.005 05/09/2016 0937   PHURINE 7.0 06/29/2016 2121   GLUCOSEU NEGATIVE 06/29/2016 2121   GLUCOSEU Negative 05/09/2016 0937   HGBUR NEGATIVE 06/29/2016 2121   BILIRUBINUR NEGATIVE 06/29/2016 2121   [place] Negative 05/09/2016 [place] 06/29/2016 2121   PROTEINUR NEGATIVE 06/29/2016 2121   UROBILINOGEN 0.2 05/09/2016 0937   NITRITE NEGATIVE 06/29/2016 2121   LEUKOCYTESUR NEGATIVE 06/29/2016 2121   LEUKOCYTESUR Negative 05/09/2016 0233   Radiology Studies: No results found. Scheduled Meds: . sodium chloride   Intravenous Once  . alteplase  2 mg Intracatheter Once  . cefTRIAXone (ROCEPHIN)  IV  2 g Intravenous Q24H  . desmopressin  10 mcg Nasal QHS  . dexamethasone  4 mg Intravenous Q12H  . fentaNYL   Intravenous Q4H  . insulin aspart  0-15 Units Subcutaneous 4 times per day  . mouth rinse  15 mL Mouth Rinse BID  . metronidazole  500 mg Intravenous Q8H  . pantoprazole (PROTONIX) IV  40 mg Intravenous Q12H  .  potassium phosphate IVPB (mmol)  10 mmol Intravenous Once   Continuous Infusions: . sodium chloride 10 mL/hr at [name]/11/18 0300  . argatroban 1.1 mcg/kg/min ([name]/11/18 0514)  . Marland KitchenTPN (CLINIMIX-E) Adult     And  . fat emulsion      Marzetta Board, MD, PhD [facility] Pager [phone]  If 7PM-7AM, please contact night-coverage [email or web address] Password [facility] 07/21/2016, 12:55 PM

## 2016-07-21 NOTE — Care Management Note (Signed)
Case Management Note  Patient Details  Name: SAMWISE ECKARDT MRN: [id number] Date of Birth: 05-17-1941  Subjective/Objective:    Patient with recurrent sbo, acute hypoxic resp failure secondsary to asp pna, pod 2 small bowel resection, conts on fentanyl pca, cyclic tpn, argatroban, iv abx, bp's soft ,received bouls. Tachycardic, plan is for CIR/SNF when stable.                Action/Plan:   Expected Discharge Date:                  Expected Discharge Plan:  IP Rehab Facility  In-House Referral:  Clinical Social Work  Discharge planning Services     Post Acute Care Choice:    Choice offered to:     DME Arranged:    DME Agency:     HH Arranged:    [place] Agency:     Status of Service:  In process, will continue to follow  If discussed at Long Length of Stay Meetings, dates discussed:    Additional Comments:  Cordel, Drewes, RN 07/21/2016, 4:50 PM

## 2016-07-22 LAB — GLUCOSE, CAPILLARY
GLUCOSE-CAPILLARY: 116 mg/dL — AB (ref 65–99)
GLUCOSE-CAPILLARY: 149 mg/dL — AB (ref 65–99)
Glucose-Capillary: 136 mg/dL — ABNORMAL HIGH (ref 65–99)
Glucose-Capillary: 115 mg/dL — ABNORMAL HIGH (ref 65–99)
Glucose-Capillary: 130 mg/dL — ABNORMAL HIGH (ref 65–99)
Glucose-Capillary: 157 mg/dL — ABNORMAL HIGH (ref 65–99)

## 2016-07-22 LAB — TYPE AND SCREEN
ABO/RH(D): O POS
Antibody Screen: NEGATIVE
UNIT DIVISION: 0

## 2016-07-22 LAB — CBC
HCT: 27.3 % — ABNORMAL LOW (ref 39.0–52.0)
Hemoglobin: 8.9 g/dL — ABNORMAL LOW (ref 13.0–17.0)
MCH: 29.9 pg (ref 26.0–34.0)
MCHC: 32.6 g/dL (ref 30.0–36.0)
MCV: 91.6 fL (ref 78.0–100.0)
PLATELETS: 47 10*3/uL — AB (ref 150–400)
RBC: 2.98 MIL/uL — AB (ref 4.22–5.81)
RDW: 21 % — ABNORMAL HIGH (ref 11.5–15.5)
WBC: 13.9 10*3/uL — ABNORMAL HIGH (ref 4.0–10.5)

## 2016-07-22 LAB — BASIC METABOLIC PANEL
Anion gap: 8 (ref 5–15)
BUN: 23 mg/dL — AB (ref 6–20)
CALCIUM: 8.1 mg/dL — AB (ref 8.9–10.3)
CO2: 25 mmol/L (ref 22–32)
Chloride: 110 mmol/L (ref 101–111)
Creatinine, Ser: 0.57 mg/dL — ABNORMAL LOW (ref 0.61–1.24)
GFR calc Af Amer: >60 mL/min (ref >60)
GLUCOSE: 115 mg/dL — AB (ref 65–99)
Potassium: 4 mmol/L (ref 3.5–5.1)
SODIUM: 143 mmol/L (ref 135–145)

## 2016-07-22 LAB — APTT: APTT: 57 s — AB (ref 24–36)

## 2016-07-22 LAB — MAGNESIUM: MAGNESIUM: 2.1 mg/dL (ref 1.7–2.4)

## 2016-07-22 LAB — PHOSPHORUS: Phosphorus: 3.5 mg/dL (ref 2.5–4.6)

## 2016-07-22 MED ORDER — MIDODRINE HCL 5 MG PO TABS
5.0000 mg | ORAL_TABLET | Freq: Three times a day (TID) | ORAL | Status: DC
  Administered 2016-07-23 – 2016-07-24 (×5): 5 mg via ORAL
  Filled 2016-07-22 (×5): qty 1

## 2016-07-22 MED ORDER — MIDODRINE HCL 5 MG PO TABS: 5.0000 mg | ORAL_TABLET | Freq: Three times a day (TID) | ORAL | Status: DC

## 2016-07-22 MED ORDER — BISACODYL 10 MG RE SUPP
10.0000 mg | Freq: Once | RECTAL | Status: AC
  Administered 2016-07-22: 10 mg via RECTAL
  Filled 2016-07-22: qty 1

## 2016-07-22 MED ORDER — FAT EMULSION 20 % IV EMUL
240.0000 mL | INTRAVENOUS | Status: AC
  Administered 2016-07-22: 240 mL via INTRAVENOUS
  Filled 2016-07-22: qty 250

## 2016-07-22 MED ORDER — TRACE MINERALS CR-CU-MN-SE-ZN 10-1000-500-60 MCG/ML IV SOLN
INTRAVENOUS | Status: AC
  Administered 2016-07-22: 17:00:00 via INTRAVENOUS
  Filled 2016-07-22 (×2): qty 2000

## 2016-07-22 MED ORDER — FUROSEMIDE 10 MG/ML IJ SOLN
20.0000 mg | Freq: Once | INTRAMUSCULAR | Status: AC
  Administered 2016-07-22: 20 mg via INTRAVENOUS
  Filled 2016-07-22: qty 2

## 2016-07-22 NOTE — Progress Notes (Signed)
Dr. Cruzita Lederer notified of patients blood pressure.  Patient with no complaints at this time.  New orders placed

## 2016-07-22 NOTE — Progress Notes (Signed)
ANTICOAGULATION CONSULT NOTE - Follow Up Consult  Pharmacy Consult for Argatroban Indication: Hx of VTE  Allergies  Allergen Reactions  . Tramadol Palpitations    "Made my chest feel tight"  . Amoxicillin Rash  . Penicillins Rash    Tolerates Rocephin Jan 2018.  TDD.  Has patient had a PCN reaction causing immediate rash, facial/tongue/throat swelling, SOB or lightheadedness with hypotension: unknown Has patient had a PCN reaction causing severe rash involving mucus membranes or skin necrosis: unknown Has patient had a PCN reaction that required hospitalization: no  Has patient had a PCN reaction occurring within the last 10 years: no  If all of the above answers are "NO", then may proceed with Cephalosporin use.    Patient Measurements: Height: '5\' 8"'$  (172.7 cm) Weight: 204 lb 2.3 oz (92.6 kg) IBW/kg (Calculated) : 68.4  Vital Signs: Temp: 97.5 F (36.4 C) (01/12 0700) Temp Source: Axillary (01/12 0700) BP: 102/78 (01/12 1000) Pulse Rate: 86 (01/12 1000)  Labs:  Recent Labs  07/20/16 0216  07/20/16 1448 07/21/16 0430 07/22/16 0500  HGB 8.1*  --   --  7.0* 8.9*  HCT 24.9*  --   --  21.7* 27.3*  PLT 59*  --   --  38* PENDING  APTT 48*  < > 66* 65* 57*  CREATININE 0.85  --   --  0.55* 0.57*  < > = values in this interval not displayed.  Estimated Creatinine Clearance: 88.1 mL/min (by C-G formula based on SCr of 0.57 mg/dL (L)).   Assessment: 76 y/o M with metastatic NSCLC, was on argatroban in past due to concern for HIT, SRA was negative.  S/p exlap with small bowel resection on 1/9. Argatroban resumed on 1/10 at 0100.   APTT remains therapeutic this morning. hgb has improved   Goal of Therapy:  aPTT 50-90  seconds Monitor platelets by anticoagulation protocol: Yes   Plan:  1. Continue argatroban at 1.1 mcg/kg/min or 6.1 ml/hr 2. Daily aPTT for monitoring  3. Consider transitioning from Argatroban to Arixtra 7.5 mg subcutaneously every 24 hours until  reliably taking PO then can go back on [place], PharmD, BCPS 07/22/2016, 10:53 AM

## 2016-07-22 NOTE — Progress Notes (Signed)
PHARMACY - ADULT TOTAL PARENTERAL NUTRITION CONSULT NOTE   Pharmacy Consult:  TPN Indication:  SBO  Patient Measurements: Height: '5\' 8"'$  (172.7 cm) Weight: 204 lb 2.3 oz (92.6 kg) IBW/kg (Calculated) : 68.4 TPN AdjBW (KG): 74.5 Body mass index is 31.04 kg/m.   Assessment:  17 YOM with recent admission on 11/26-12/4/17 for SBO, re-admitted to [facility] on 06/24/16 with PE and recurrent SBO. Patient was unable to tolerate fluids and had continued nausea and vomiting.  NG tube placement failed multiple times and Pharmacy was consulted to manage TPN for nutritional support.  Patient transferred to [facility] on 07[facility]14[facility].  Per documentation, his intake was </= 75% for 1 month PTA.  Now with severe malnutrition.  Surgery was delayed because patient was too debilitated.   ZC:[id number] 10.7 > 6.5> 18.5 -now WNL.  OR 07/19/16: lap LOA, removal meckel's diverticulum, small bowel resection for ? Contained perforation. Last BM 07/13/16. Pt +flatus, surgery advanced diet to clears. NGT clamped. If tolerates - plan is to pull NGT. Will continue with cyclic TPN this evening with possible plans to start reducing the rate on 1/13 if diet is tolerated and +BM. Endo: no hx DM - 25 units insulin/bag  (on Decadron '4mg'$  BID).  DDAVP for diabetes insipidus. CGS 122 - 162 during TPN infusion Insulin requirements during TPN infusion: 25 units in TPN + 5 units SSI Lytes: K 4, Phos 3.5, Mg 2.1, noted K/Phos up s/p replacement on 1/11 *day without lytes: 1/4, 1/5, 1/6 Renal: BPH.  AKI resolved - SCr down to wnl, 24, D5NS at 75 ml/hr and NS at 10 ml/hr,  UOP 0.9 ml/kg/hr, + 3340 ml Pulm: 4L [place] Cards:  not on med AC: Xarelto PTA for hx PE/DVT > transitioned to argatroban and then Eliquis, bleeding on AC so received IVC filter, then resumed Xarelto and now on argatroban again - hgb 7, plts 38 Onc: MM / SCC / metastatic lung cancer on immunotherapy with Ketruda (on hold) Hepatobil: AST/ALT WNL, alk phos and tbili WNL.  TG WNL Neuro:  DJD - A&O ID: CTX/Flagyl for colitis - afebrile, WBC WNL Best Practices: argatroban TPN Access: right CVC port placed 10/30/12 TPN start date: 07/09/16  Nutritional Goals: (per RD note on 1/11) 2150-2350 kCal and 115-125 gm protein per day  Current Nutrition:  Cyclic TPN over 14 hrs   Plan:  - Continue cycle at 14 hrs: Clinimix E 5/15 infuse 2L over 14 hours: 50 ml/hr x 1 hr, then 158 ml/hr x 12 hrs, then 50 ml/hr x 1 hr - Lipids at 17 ml/hr x 14 hours (total 240 ml/day) - TPN will provide 1894 kCal and 100 gm of protein per day, meeting 88% of kCal and 87% of protein needs.  Capping TPN at this rate d/t critical Clinimix shortage. - Daily multivitamin and trace elements in TPN - Continue moderate SSI to q6h (09:00, 16:00, 20:00, 23:00) and continue with 25 units of regular insulin in TPN - F/U TPN labs  Thank you for allowing pharmacy to be a part of this patient's care.  Alycia Rossetti, PharmD, BCPS Clinical Pharmacist Pager: [phone] Clinical phone for 07/22/2016 from 7a-3:30p: [phone] If after 3:30p, please call main pharmacy at: [phone] 07/22/2016 9:25 AM

## 2016-07-22 NOTE — Care Management Important Message (Signed)
Important Message  Patient Details  Name: [name] MRN: [id number] Date of Birth: 1941/05/30   Medicare Important Message Given:  Yes    Nathen May 07/22/2016, 12:37 PM

## 2016-07-22 NOTE — Progress Notes (Signed)
Physical Therapy Treatment Patient Details Name: [name] MRN: [id number] DOB: 1940/08/21 Today's Date: 07/22/2016    History of Present Illness Patient is a 76 y/o male with hx of recently diagnosed stage IV NSCLC (80% PDL1+) with brain mets, DVT and PEs s/p IVC filter,recent SBO, DM presents with recurrent SBO. Transferred to CIR on 1/3, but developed vomiting again and respiratory distress requiring nonrebreather and transferred back to acute care. NG tube placed due to SBO. s/p lap LOA 1/9.    PT Comments    Pt rolling in bed initially to assist with nursing care and progressed to sitting EOB with +2 assistance. Pt attempting to use UEs and LEs to come sitting EOB. While sitting pt reported feeling increasingly dizzy and weak with dropping BP. BP retaken with continued drop and pt requesting return to supine. Unable to tolerate standing today. PT to continue to follow and progress as tolerated. Pt left in care of nursing following session.   Follow Up Recommendations  CIR     Equipment Recommendations  None recommended by PT    Recommendations for Other Services       Precautions / Restrictions Precautions Precautions: Fall Precaution Comments: monitor BP, hypotensive Restrictions Weight Bearing Restrictions: No    Mobility  Bed Mobility Overal bed mobility: Needs Assistance Bed Mobility: Rolling;Supine to Sit;Sit to Supine Rolling: +2 for physical assistance;Max assist   Supine to sit: +2 for physical assistance;Max assist Sit to supine: +2 for physical assistance;Total assist   General bed mobility comments: Pt assisting with UEs and attempting to move LEs to EOB.   Transfers                 General transfer comment: unable to attempt due to dropping BP  Ambulation/Gait                 Stairs            Wheelchair Mobility    Modified Rankin (Stroke Patients Only)       Balance Overall balance assessment: Needs  assistance Sitting-balance support: Feet supported Sitting balance-Leahy Scale: Poor Sitting balance - Comments: able to sit for brief periods of time with min guard assistance while sitting EOB                            Cognition Arousal/Alertness: Awake/alert Behavior During Therapy: WFL for tasks assessed/performed Overall Cognitive Status: Within Functional Limits for tasks assessed                      Exercises      General Comments General comments (skin integrity, edema, etc.): BP dropping while sitting EOB with pt reports feeling increasingly dizzy. Returned to supine with improved pressures.       Pertinent Vitals/Pain Pain Assessment: Faces Faces Pain Scale: Hurts little more Pain Location: abdomen Pain Descriptors / Indicators: Guarding;Sore Pain Intervention(s): Limited activity within patient's tolerance;Monitored during session    Home Living                      Prior Function            PT Goals (current goals can now be found in the care plan section) Acute Rehab PT Goals Patient Stated Goal: keep improving PT Goal Formulation: With patient Time For Goal Achievement: 08/01/16 Potential to Achieve Goals: Good Progress towards PT goals: Progressing toward goals  Frequency    Min 3X/week      PT Plan Current plan remains appropriate    Co-evaluation             End of Session   Activity Tolerance: Patient limited by fatigue Patient left: in bed;with call bell/phone within reach;with nursing/sitter in room;with family/visitor present     Time: 3838-1840 PT Time Calculation (min) (ACUTE ONLY): 26 min  Charges:  $Therapeutic Activity: 23-37 mins                    G Codes:      Cassell Clement, PT, CSCS Pager [phone] Office [phone]  07/22/2016, 3:00 PM

## 2016-07-22 NOTE — Progress Notes (Signed)
Patient ID: [name], male   DOB: [birth year], 76 y.o.   MRN: [id number]  [facility] Surgery Progress Note  3 Days Post-Op  Subjective: States that he started passing a moderate amount of gas last night. Feels like he needs to have a BM soon. Denies n/v. Abdominal pain about the same. States that getting up to side of bed yesterday was easier than the day before.  Objective: Vital signs in last 24 hours: Temp:  [96.7 F (35.9 C)-98.5 F (36.9 C)] 97.8 F (36.6 C) (01/12 0300) Pulse Rate:  [82-90] 84 (01/12 0700) Resp:  [11-23] 15 (01/12 0751) BP: (88-130)/(67-106) 130/106 (01/12 0700) SpO2:  [97 %-100 %] 97 % (01/12 0751) FiO2 (%):  [22 %] 22 % (01/12 0751) Last BM Date: 07/13/16  Intake/Output from previous day: 01/11 0701 - 01/12 0700 In: 4213.1 [I.V.:3624.7; Blood:335; IV Piggyback:253.3] Out: 1400 [Urine:1400] Intake/Output this shift: Total I/O In: 10 [I.V.:10] Out: 1000 [Urine:1000]  PE: Gen:  Alert, NAD, pleasant Pulm:  CTAB, no W/R/R, effort normal Abd: Soft, ND, appropriately tender, +BS, midline incision C/D/I with staples intact and honeycomb dressing in place  Lab Results:   Recent Labs  07/20/16 0216 07/21/16 0430  WBC 7.8 9.5  HGB 8.1* 7.0*  HCT 24.9* 21.7*  PLT 59* 38*   BMET  Recent Labs  07/20/16 0216 07/21/16 0430  NA 140 141  K 3.9 3.8  CL 111 114*  CO2 22 21*  GLUCOSE 145* 197*  BUN 27* 24*  CREATININE 0.85 0.55*  CALCIUM 7.3* 7.5*   PT/INR No results for input(s): LABPROT, INR in the last 72 hours. CMP     Component Value Date/Time   NA 141 07/21/2016 0430   NA 132 (L) 06/15/2016 0852   K 3.8 07/21/2016 0430   K 4.1 06/15/2016 0852   CL 114 (H) 07/21/2016 0430   CL 109 (H) 12/31/2012 0759   CO2 21 (L) 07/21/2016 0430   CO2 23 06/15/2016 0852   GLUCOSE 197 (H) 07/21/2016 0430   GLUCOSE 101 06/15/2016 0852   GLUCOSE 116 (H) 12/31/2012 0759   BUN 24 (H) 07/21/2016 0430   BUN 19.0 06/15/2016 0852   CREATININE  0.55 (L) 07/21/2016 0430   CREATININE 0.8 06/15/2016 0852   CALCIUM 7.5 (L) 07/21/2016 0430   CALCIUM 8.5 06/15/2016 0852   PROT 3.5 (L) 07/21/2016 0430   PROT 5.6 (L) 06/15/2016 0852   ALBUMIN 1.4 (L) 07/21/2016 0430   ALBUMIN 2.8 (L) 06/15/2016 0852   AST 16 07/21/2016 0430   AST 10 06/15/2016 0852   ALT 31 07/21/2016 0430   ALT 27 06/15/2016 0852   ALKPHOS 64 07/21/2016 0430   ALKPHOS 77 06/15/2016 0852   BILITOT 0.4 07/21/2016 0430   BILITOT 0.99 06/15/2016 0852   GFRNONAA >60 07/21/2016 0430   GFRAA >60 07/21/2016 0430   Lipase     Component Value Date/Time   LIPASE 21 06/24/2016 1622       Studies/Results: No results found.  Anti-infectives: Anti-infectives    Start     Dose/Rate Route Frequency Ordered Stop   07/19/16 0800  cefoTEtan (CEFOTAN) 1 g in dextrose 5 % 50 mL IVPB     1 g 100 mL/hr over 30 Minutes Intravenous To [facility] 07/18/16 1112 07/19/16 1518   07/14/16 2200  cefTRIAXone (ROCEPHIN) 2 g in dextrose 5 % 50 mL IVPB  Status:  Discontinued     2 g 100 mL/hr over 30 Minutes Intravenous Every 24 hours  07/14/16 0915 07/21/16 1309   07/13/16 2200  cefTRIAXone (ROCEPHIN) 1 g in dextrose 5 % 50 mL IVPB  Status:  Discontinued     1 g 100 mL/hr over 30 Minutes Intravenous Every 24 hours 07/13/16 2132 07/14/16 0915   07/13/16 2200  metroNIDAZOLE (FLAGYL) IVPB 500 mg  Status:  Discontinued     500 mg 100 mL/hr over 60 Minutes Intravenous Every 8 hours 07/13/16 2132 07/21/16 1309       Assessment/Plan Recurrent SBO S/p Lap assisted lysis of adhesions, small bowel resection, and laparoscopic resection of Meckel's diverticulum 1/9 Dr. Barry Dienes - POD 3 - path pending - NG clamped - WBC pending, afebrile - passing moderate amount of flatus  Acute hypoxic respiratory failure secondary to possible aspiration event / Aspiration pneumonitis versus pneumonia- supplemental O2, stable Thrombocytopenia, ABLA - Hg pending. Received 1 uPRBC  yesterday NSCLC- being treated with Keytruda, Dr. [name] Multiple myeloma with skull lesion - in remission Hx PE on Eliquis, new DVT on NOAC s/p filter - held, h/o HIT, on argatroban  Stage 2 sacral ulcer - per wound care  ID - ceftriaxone 1/3>>1/11, flagyl 1/3>>1/11 FEN - clears, IVF, TPN VTE - argatroban  Code status - DNR  Plan - Passing flatus, feels like he could have a BM soon. Will advance to clear liquids (take it slow); if tolerating this may pull NGT later today. Continue TPN. Continue Pt/mobility; recommending CIR when stable for discharge. Labs pending.    LOS: 9 days    [name] , [facility] Surgery 07/22/2016, 8:30 AM Pager: [phone] Consults: [phone] Mon-Fri 7:00 am-4:30 pm Sat-Sun 7:00 am-11:30 am

## 2016-07-22 NOTE — Progress Notes (Addendum)
PROGRESS NOTE  [name] CHY:[id number] DOB: 1940-07-14 DOA: 07/13/2016 PCP: [name], MD   LOS: 9 days   Brief Narrative: 76 year old man with history significant for MM and stage IV NSCLC now with mets to brain discovered 04/2016 undergoing radiotherapy to brain lesions and Ketruda, pulmonary embolism on Xarelto. He has been admitted several times in the past few months, initially for SBO back in October for SBO, which resolved with conservative therapy. He was deemed a poor surgical candidate, but it was felt that if he could regain some strength he could undergo surgery. He was admitted again 12/15 with projectile vomiting and SOB. He was then found to have GNR bacteremia. Course became complicated by shock and DVT while he was off DOAC for colitis related GIB. IVC filter placed. He was started on TPN 1/2 and transferred to CIR 1/3. Later that day he developed vomiting again where and was suspected to have aspirated developing hypoxia and significant respiratory distress. Patient admitted with Recurrent SBO and acute hypoxic respiratory failure secondary to aspiration PNA. He has been receiving TPN, IV antibiotics. He still has NG tube, surgery has been following and patient is s/p laparoscopic LOA 1/9  Assessment & Plan: Active Problems:   Multiple myeloma (HCC)   DVT of leg (deep venous thrombosis) (HCC)   History of pulmonary embolism   Peripheral edema   SBO (small bowel obstruction)   Diabetes insipidus (HCC)   Thrombocytopenia (HCC)   Hypoalbuminemia due to protein-calorie malnutrition (HCC)   Primary malignant neoplasm of lung metastatic to other site [facility])   Pressure injury of skin   Aspiration into airway   Goals of care, counseling/discussion   Palliative care by specialist   Recurrent SBO - Gen. surgery was consulted, patient was taken to the operating room on 1/9 and he is now status post laparoscopic lysis of adhesions. Continue parenteral nutrition, appreciate  surgical consultation - awaiting bowel function to fully return, advance diet to clears per general surgery, NG tube removal per surgery as well.  Acute hypoxic Respiratory failure, secondary to possible aspiration event / Aspiration pneumonitis versus pneumonia - suplemmental oxygen as needed to keep O2 sats > 88% - On IV ceftriaxone and flagyl, discontinued 1/11 as he had completed 8 days  NSCLC (80% PDL1+)-->treating w/ Keytruda > - Dr. [name] is primary oncologist H/o Multiple Myeloma w/ skull lesion s/p XRT 2016 in remission.   H/o PE and DVT on Eliquis-->new DVTs on NOAC 12/21 > S/P temp filter 12/1 - H/O HIT  - On Argatroban. Continue for now. Resume NOAC when able to take consistently PO  Thrombocytopenia, acute blood loss anemia. - Now on argatroban.  - HIT panel; repeated 1-05; at 0.248 - Hb 7.0 1/11, transfused 1U pRBC. No bleeding noted - CBC this morning is pending  Chronic diastolic HF - Monitor volume status, he appears to have significant lower extremity edema and generalized anasarca, a component of hypoalbuminemia due to malnutrition is present as well. His respiratory status is stable, his blood pressure has improved in the last couple of days and we'll attempt to start IV diuresis with Lasix  Transaminases - trending down.   Hypoalbuminemia  - Pre-albumin 18 - Continuee with TPN.   AKI - resolved, continue to monitor  Hyponatremia - resolved.   Addison diseases - decadron, Desmopressin.   Stage 2 sacral ulcer noted 2x2 cms - Wound care consult   DVT prophylaxis: argatroban  Code Status: DNR Family Communication: no family bedside Disposition Plan:  TBD  Consultants:   General surgery   Oncology   Procedures:   Laparoscopic LOA 1/9  Antimicrobials:  Ceftriaxone 1/3 >> 1/11  Flagyl 1/3 >> 1/11  Subjective: - no chest pain, shortness of breath, no abdominal pain, nausea or vomiting. Feels great today. Passing flatus, no BMs  yet  Objective: Vitals:   07/22/16 0500 07/22/16 0600 07/22/16 0700 07/22/16 0751  BP: 97/81 96/67 (!) 130/106   Pulse: 83 82 84   Resp: '11 13 12 15  '$ Temp:   97.5 F (36.4 C)   TempSrc:   Axillary   SpO2: 97% 98% 98% 97%  Weight:      Height:        Intake/Output Summary (Last 24 hours) at 07/22/16 1015 Last data filed at 07/22/16 1025  Gross per 24 hour  Intake          3067.38 ml  Output             2400 ml  Net           667.38 ml   Filed Weights   07/13/16 2130  Weight: 92.6 kg (204 lb 2.3 oz)    Examination: Constitutional: NAD Vitals:   07/22/16 0500 07/22/16 0600 07/22/16 0700 07/22/16 0751  BP: 97/81 96/67 (!) 130/106   Pulse: 83 82 84   Resp: '11 13 12 15  '$ Temp:   97.5 F (36.4 C)   TempSrc:   Axillary   SpO2: 97% 98% 98% 97%  Weight:      Height:       Eyes: PERRL, lids and conjunctivae normal Respiratory: clear to auscultation bilaterally, no wheezing, no crackles. Cardiovascular: Regular rate and rhythm, no murmurs / rubs / gallops. 3+ pitting LE edema Abdomen: no tenderness. Bowel sounds decreased. Incision dressing c/d/i Musculoskeletal: no clubbing / cyanosis. Neurologic: non focal    Data Reviewed: I have personally reviewed following labs and imaging studies  CBC:  Recent Labs Lab 07/18/16 0656 07/19/16 0245 07/19/16 1157 07/20/16 0216 07/21/16 0430  WBC 9.3 8.3 7.6 7.8 9.5  NEUTROABS 8.7*  --  7.1  --   --   HGB 8.1* 8.2* 8.7* 8.1* 7.0*  HCT 24.2* 24.9* 26.9* 24.9* 21.7*  MCV 91.3 92.9 92.8 94.0 95.6  PLT 38* 34* 75* 59* 38*   Basic Metabolic Panel:  Recent Labs Lab 07/16/16 0448 07/17/16 0257 07/18/16 0656 07/19/16 0245 07/20/16 0216 07/21/16 0430  NA 144 142 142 140 140 141  K 3.6 3.4* 3.5 4.0 3.9 3.8  CL 113* 112* 112* 111 111 114*  CO2 '23 22 24 24 22 '$ 21*  GLUCOSE 199* 209* 186* 135* 145* 197*  BUN 38* 37* 36* 31* 27* 24*  CREATININE 0.82 0.73 0.66 0.70 0.85 0.55*  CALCIUM 7.6* 7.4* 7.6* 7.7* 7.3* 7.5*  MG  --    --  1.7 2.0  --  1.8  PHOS 2.3*  --  2.8  --   --  2.3*   GFR: Estimated Creatinine Clearance: 88.1 mL/min (by C-G formula based on SCr of 0.55 mg/dL (L)). Liver Function Tests:  Recent Labs Lab 07/17/16 0257 07/18/16 0656 07/21/16 0430  AST 48* 32 16  ALT 110* 82* 31  ALKPHOS 71 74 64  BILITOT <0.1* 0.6 0.4  PROT 3.4* 3.3* 3.5*  ALBUMIN 1.4* 1.4* 1.4*   No results for input(s): LIPASE, AMYLASE in the last 168 hours. No results for input(s): AMMONIA in the last 168 hours. Coagulation Profile: No results for input(s):  INR, PROTIME in the last 168 hours. Cardiac Enzymes: No results for input(s): CKTOTAL, CKMB, CKMBINDEX, TROPONINI in the last 168 hours. BNP (last 3 results) No results for input(s): PROBNP in the last 8760 hours. HbA1C: No results for input(s): HGBA1C in the last 72 hours. CBG:  Recent Labs Lab 07/21/16 1557 07/21/16 2011 07/21/16 2318 07/22/16 0324 07/22/16 0833  GLUCAP 110* 162* 122* 136* 116*   Lipid Profile: No results for input(s): CHOL, HDL, LDLCALC, TRIG, CHOLHDL, LDLDIRECT in the last 72 hours. Thyroid Function Tests: No results for input(s): TSH, T4TOTAL, FREET4, T3FREE, THYROIDAB in the last 72 hours. Anemia Panel: No results for input(s): VITAMINB12, FOLATE, FERRITIN, TIBC, IRON, RETICCTPCT in the last 72 hours. Urine analysis:    Component Value Date/Time   COLORURINE YELLOW 06/29/2016 2121   APPEARANCEUR CLEAR 06/29/2016 2121   LABSPEC 1.014 06/29/2016 2121   LABSPEC 1.005 05/09/2016 0937   PHURINE 7.0 06/29/2016 2121   GLUCOSEU NEGATIVE 06/29/2016 2121   GLUCOSEU Negative 05/09/2016 0937   HGBUR NEGATIVE 06/29/2016 2121   BILIRUBINUR NEGATIVE 06/29/2016 2121   [place] Negative 05/09/2016 [facility] 06/29/2016 2121   PROTEINUR NEGATIVE 06/29/2016 2121   UROBILINOGEN 0.2 05/09/2016 0937   NITRITE NEGATIVE 06/29/2016 2121   LEUKOCYTESUR NEGATIVE 06/29/2016 2121   LEUKOCYTESUR Negative 05/09/2016 2518    Radiology Studies: No results found. Scheduled Meds: . sodium chloride   Intravenous Once  . alteplase  2 mg Intracatheter Once  . desmopressin  10 mcg Nasal QHS  . dexamethasone  4 mg Intravenous Q12H  . fentaNYL   Intravenous Q4H  . insulin aspart  0-15 Units Subcutaneous 4 times per day  . mouth rinse  15 mL Mouth Rinse BID  . pantoprazole (PROTONIX) IV  40 mg Intravenous Q12H   Continuous Infusions: . sodium chloride 10 mL/hr at 07/21/16 0300  . argatroban 1.1 mcg/kg/min (07/22/16 0600)    Marzetta Board, MD, PhD [facility] Pager [phone]  If 7PM-7AM, please contact night-coverage [email or web address] Password [facility] 07/22/2016, 10:15 AM

## 2016-07-22 NOTE — Anesthesia Postprocedure Evaluation (Addendum)
Anesthesia Post Note  Patient: [name]  Procedure(s) Performed: Procedure(s) (LRB): HAND ASSISTED LAPAROSCOPIC ABDOMINAL EXPLORATION W/ LYSIS OF ADHESIONS (N/A) LYSIS OF ADHESION (N/A)  Patient location during evaluation: PACU Anesthesia Type: General Level of consciousness: awake and sedated Pain management: pain level controlled Vital Signs Assessment: post-procedure vital signs reviewed and stable Respiratory status: spontaneous breathing, nonlabored ventilation, respiratory function stable and patient connected to nasal cannula oxygen Cardiovascular status: blood pressure returned to baseline and stable Postop Assessment: no signs of nausea or vomiting Anesthetic complications: no       Last Vitals:  Vitals:   07/22/16 0700 07/22/16 0751  BP: (!) 130/106   Pulse: 84   Resp: 12 15  Temp: 36.4 C     Last Pain:  Vitals:   07/22/16 0751  TempSrc:   PainSc: 4                  Jazae Gandolfi,JAMES TERRILL

## 2016-07-23 LAB — CBC
HCT: 24.3 % — ABNORMAL LOW (ref 39.0–52.0)
Hemoglobin: 7.9 g/dL — ABNORMAL LOW (ref 13.0–17.0)
MCH: 30.5 pg (ref 26.0–34.0)
MCHC: 32.5 g/dL (ref 30.0–36.0)
MCV: 93.8 fL (ref 78.0–100.0)
PLATELETS: 47 10*3/uL — AB (ref 150–400)
RBC: 2.59 MIL/uL — AB (ref 4.22–5.81)
RDW: 21.4 % — ABNORMAL HIGH (ref 11.5–15.5)
WBC: 13.5 10*3/uL — AB (ref 4.0–10.5)

## 2016-07-23 LAB — GLUCOSE, CAPILLARY
GLUCOSE-CAPILLARY: 157 mg/dL — AB (ref 65–99)
GLUCOSE-CAPILLARY: 154 mg/dL — AB (ref 65–99)
Glucose-Capillary: 152 mg/dL — ABNORMAL HIGH (ref 65–99)

## 2016-07-23 LAB — APTT: APTT: 64 s — AB (ref 24–36)

## 2016-07-23 MED ORDER — OXYCODONE HCL 5 MG PO TABS
5.0000 mg | ORAL_TABLET | ORAL | Status: DC | PRN
  Administered 2016-07-23 (×3): 10 mg via ORAL
  Administered 2016-07-24: 5 mg via ORAL
  Administered 2016-07-24 (×2): 10 mg via ORAL
  Administered 2016-07-25 – 2016-07-26 (×6): 5 mg via ORAL
  Administered 2016-07-27 – 2016-07-28 (×2): 10 mg via ORAL
  Filled 2016-07-23 (×5): qty 1
  Filled 2016-07-23 (×10): qty 2
  Filled 2016-07-23: qty 1

## 2016-07-23 MED ORDER — FAT EMULSION 20 % IV EMUL
250.0000 mL | INTRAVENOUS | Status: DC
  Administered 2016-07-23: 250 mL via INTRAVENOUS
  Filled 2016-07-23: qty 250

## 2016-07-23 MED ORDER — INSULIN REGULAR HUMAN 100 UNIT/ML IJ SOLN
INTRAVENOUS | Status: DC
  Administered 2016-07-23: 18:00:00 via INTRAVENOUS
  Filled 2016-07-23 (×2): qty 1000

## 2016-07-23 MED ORDER — FUROSEMIDE 10 MG/ML IJ SOLN
20.0000 mg | Freq: Once | INTRAMUSCULAR | Status: AC
  Administered 2016-07-23: 20 mg via INTRAVENOUS
  Filled 2016-07-23: qty 2

## 2016-07-23 MED ORDER — ADULT MULTIVITAMIN W/MINERALS CH
1.0000 | ORAL_TABLET | Freq: Every day | ORAL | Status: DC
  Administered 2016-07-23 – 2016-07-27 (×5): 1 via ORAL
  Filled 2016-07-23 (×7): qty 1

## 2016-07-23 NOTE — Progress Notes (Signed)
ANTICOAGULATION CONSULT NOTE - Follow Up Consult  Pharmacy Consult for Argatroban Indication: Hx of VTE (DVT and PE) - new DVT on NOAC 12/21  Allergies  Allergen Reactions  . Tramadol Palpitations    "Made my chest feel tight"  . Amoxicillin Rash  . Penicillins Rash    Tolerates Rocephin Jan 2018.  TDD.  Has patient had a PCN reaction causing immediate rash, facial/tongue/throat swelling, SOB or lightheadedness with hypotension: unknown Has patient had a PCN reaction causing severe rash involving mucus membranes or skin necrosis: unknown Has patient had a PCN reaction that required hospitalization: no  Has patient had a PCN reaction occurring within the last 10 years: no  If all of the above answers are "NO", then may proceed with Cephalosporin use.    Patient Measurements: Height: '5\' 8"'$  (172.7 cm) Weight: 204 lb 2.3 oz (92.6 kg) IBW/kg (Calculated) : 68.4  Vital Signs: Temp: 97.8 F (36.6 C) (01/13 0416) Temp Source: Oral (01/13 0416) BP: 102/66 (01/13 0435) Pulse Rate: 86 (01/13 0435)  Labs:  Recent Labs  07/21/16 0430 07/22/16 0500 07/23/16 0338  HGB 7.0* 8.9* 7.9*  HCT 21.7* 27.3* 24.3*  PLT 38* 47* 47*  APTT 65* 57* 64*  CREATININE 0.55* 0.57*  --     Estimated Creatinine Clearance: 88.1 mL/min (by C-G formula based on SCr of 0.57 mg/dL (L)).   Assessment: 76 y/o M with metastatic NSCLC, was on argatroban in past due to concern for HIT, SRA was negative.  S/p exlap with small bowel resection on 1/9. Argatroban resumed on 1/10.   APTT remains therapeutic this morning. Hgb downtrending, but trend stable (7 > 8.9 > 7.9). Plt low, but stable at 47. No s/s bleeding noted.   Goal of Therapy:  aPTT 50-90  seconds Monitor platelets by anticoagulation protocol: Yes   Plan:  1. Continue argatroban at 1.1 mcg/kg/min or 6.1 ml/hr 2. Daily aPTT for monitoring  3. Consider transitioning from Argatroban to Arixtra 7.5 mg subcutaneously every 24 hours until reliably  taking PO then can go back on oral anticoagulant.   Argie Ramming, PharmD Pharmacy Resident  Pager [phone] 07/23/16 6:59 AM

## 2016-07-23 NOTE — Progress Notes (Signed)
Pt's telemonitor not working in pt's room. Cables and leads changed, still not working. Maintenance notified by Network engineer. Consuelo Pandy RN

## 2016-07-23 NOTE — Progress Notes (Signed)
Patient ID: [name], male   DOB: [birth year], 76 y.o.   MRN: [id number]  [facility] Surgery Progress Note  4 Days Post-Op  Subjective: No new complaints. Tolerating clear liquids. Patient had multiple BM's yesterday.  Working with PT who is recommending CIR when stable for discharge.  Objective: Vital signs in last 24 hours: Temp:  [97.5 F (36.4 C)-97.8 F (36.6 C)] 97.5 F (36.4 C) (01/13 0721) Pulse Rate:  [86-99] 87 (01/13 0721) Resp:  [12-24] 16 (01/13 0903) BP: (73-102)/(53-75) 97/70 (01/13 0721) SpO2:  [95 %-100 %] 100 % (01/13 0903) FiO2 (%):  [23 %-26 %] 23 % (01/12 1824) Weight:  [215 lb 6.2 oz (97.7 kg)] 215 lb 6.2 oz (97.7 kg) (01/13 0900) Last BM Date: 07/22/16  Intake/Output from previous day: 01/12 0701 - 01/13 0700 In: 891.6 [P.O.:400; I.V.:491.6] Out: 5375 [Urine:5375] Intake/Output this shift: Total I/O In: 2287.2 [P.O.:120; I.V.:2167.2] Out: 72 [Urine:450]  PE: Gen: Alert, NAD, pleasant Pulm: effort normal Abd: Soft, ND, appropriately tender, +BS, midline incision C/D/I with staples intact and honeycomb dressing in place  Lab Results:   Recent Labs  07/22/16 0500 07/23/16 0338  WBC 13.9* 13.5*  HGB 8.9* 7.9*  HCT 27.3* 24.3*  PLT 47* 47*   BMET  Recent Labs  07/21/16 0430 07/22/16 0500  NA 141 143  K 3.8 4.0  CL 114* 110  CO2 21* 25  GLUCOSE 197* 115*  BUN 24* 23*  CREATININE 0.55* 0.57*  CALCIUM 7.5* 8.1*   PT/INR No results for input(s): LABPROT, INR in the last 72 hours. CMP     Component Value Date/Time   NA 143 07/22/2016 0500   NA 132 (L) 06/15/2016 0852   K 4.0 07/22/2016 0500   K 4.1 06/15/2016 0852   CL 110 07/22/2016 0500   CL 109 (H) 12/31/2012 0759   CO2 25 07/22/2016 0500   CO2 23 06/15/2016 0852   GLUCOSE 115 (H) 07/22/2016 0500   GLUCOSE 101 06/15/2016 0852   GLUCOSE 116 (H) 12/31/2012 0759   BUN 23 (H) 07/22/2016 0500   BUN 19.0 06/15/2016 0852   CREATININE 0.57 (L) 07/22/2016 0500   CREATININE 0.8 06/15/2016 0852   CALCIUM 8.1 (L) 07/22/2016 0500   CALCIUM 8.5 06/15/2016 0852   PROT 3.5 (L) 07/21/2016 0430   PROT 5.6 (L) 06/15/2016 0852   ALBUMIN 1.4 (L) 07/21/2016 0430   ALBUMIN 2.8 (L) 06/15/2016 0852   AST 16 07/21/2016 0430   AST 10 06/15/2016 0852   ALT 31 07/21/2016 0430   ALT 27 06/15/2016 0852   ALKPHOS 64 07/21/2016 0430   ALKPHOS 77 06/15/2016 0852   BILITOT 0.4 07/21/2016 0430   BILITOT 0.99 06/15/2016 0852   GFRNONAA >60 07/22/2016 0500   GFRAA >60 07/22/2016 0500   Lipase     Component Value Date/Time   LIPASE 21 06/24/2016 1622       Studies/Results: No results found.  Anti-infectives: Anti-infectives    Start     Dose/Rate Route Frequency Ordered Stop   07/19/16 0800  cefoTEtan (CEFOTAN) 1 g in dextrose 5 % 50 mL IVPB     1 g 100 mL/hr over 30 Minutes Intravenous To [facility] 07/18/16 1112 07/19/16 1518   07/14/16 2200  cefTRIAXone (ROCEPHIN) 2 g in dextrose 5 % 50 mL IVPB  Status:  Discontinued     2 g 100 mL/hr over 30 Minutes Intravenous Every 24 hours 07/14/16 0915 07/21/16 1309   07/13/16 2200  cefTRIAXone (ROCEPHIN) 1  g in dextrose 5 % 50 mL IVPB  Status:  Discontinued     1 g 100 mL/hr over 30 Minutes Intravenous Every 24 hours 07/13/16 2132 07/14/16 0915   07/13/16 2200  metroNIDAZOLE (FLAGYL) IVPB 500 mg  Status:  Discontinued     500 mg 100 mL/hr over 60 Minutes Intravenous Every 8 hours 07/13/16 2132 07/21/16 1309       Assessment/Plan Recurrent SBO S/p Lap assisted lysis of adhesions, small bowel resection, and laparoscopic resection of Meckel's diverticulum1/9 Dr. Barry Dienes - POD 4 - path pending - tolerating clears - had multiple BM's yesterday - WBC 13.5, afebrile  Acute hypoxic respiratory failure secondary to possible aspiration event / Aspiration pneumonitis versus pneumonia- supplemental O2, stable Thrombocytopenia, ABLA- Hg 7.9 from 8.9. Received 1 uPRBC 1/11. Transfusions per IM NSCLC-  being treated with Keytruda, Dr. [name] Multiple myeloma with skull lesion - in remission Hx PE on Eliquis, new DVT on NOAC s/p filter - held, h/o HIT, on argatroban Stage 2 sacral ulcer- per wound care  ID - ceftriaxone 1/3>>1/11, flagyl 1/3>>1/11 FEN - full liquids, IVF, TPN VTE - argatroban  Code status - DNR  Plan - Bowel function returning. Advance to full liquids.   Continue TPN, if able to tolerate adequate amount of full liquids will start to wean off TPN.   Continue Pt/mobility; recommending CIR when stable for discharge. Path pending.   LOS: 10 days    [name] , [facility] Surgery 07/23/2016, 11:06 AM Pager: [phone] Consults: [phone]  Agree with above. The patient and his wife want him to go to rehab here on discharge.  That is okay with [place]. He is doing well from the GI standpoint and, if he tolerates the diet well, we can wean the TPN. Can also restart Xarelto tomorrow - if he continues to do this well.  [name], MD, [facility] Surgery Pager: [phone] Office phone:  [phone]

## 2016-07-23 NOTE — Progress Notes (Signed)
PHARMACY - ADULT TOTAL PARENTERAL NUTRITION CONSULT NOTE   Pharmacy Consult:  TPN Indication:  SBO  Patient Measurements: Height: _0  (172.7 cm) Weight: 215 lb 6.2 oz (97.7 kg) IBW/kg (Calculated) : 68.4 TPN AdjBW (KG): 74.5 Body mass index is 32.75 kg/m.   Assessment:  56 YOM with recent admission on 11/26-12/4/17 for SBO, re-admitted to [facility] on 06/24/16 with PE and recurrent SBO. Patient was unable to tolerate fluids and had continued nausea and vomiting.  NG tube placement failed multiple times and Pharmacy was consulted to manage TPN for nutritional support.  Patient transferred to [facility] on 07/14/15.  Per documentation, his intake was </= 75% for 1 month PTA.  Now with severe malnutrition.  Surgery was delayed because patient was too debilitated.   VA:POLIDCVUDT 10.7 > 6.5> 18.5 -now WNL.  OR 07/19/16: lap LOA, removal meckel's diverticulum, small bowel resection for ? Contained perforation.  + BM yesterday x 3 recorded, diet advance to clears yesterday - 400 mls intake recorded yesterday plus 120 overnights. NGT removed 1/12.   Endo: no hx DM - 25 units insulin/bag  (on Decadron 20m BID).  DDAVP for diabetes insipidus. CGS 129- 152 during TPN infusion Insulin requirements during TPN infusion: 25 units in TPN + 4 units SSI Lytes: no labs today *day without lytes: 1/4, 1/5, 1/6 Renal: BPH.  AKI resolved - SCr down to wnl, lasix 20 IV x 1 per MD today  Pulm: 4L [place] Cards:  not on med AC: Xarelto PTA for hx PE/DVT > transitioned to argatroban and then Eliquis, bleeding on AC so received IVC filter, then resumed Xarelto and now on argatroban again - hgb 7.9, plts 64 Onc: MM / SCC / metastatic lung cancer on immunotherapy with Ketruda (on hold) Hepatobil: AST/ALT WNL, alk phos and tbili WNL.  TG WNL Neuro: DJD - A&O ID: off abx- afebrile, WBC 13.5 (decadron) Best Practices: argatroban TPN Access: right CVC port placed 10/30/12 TPN start date: 07/09/16  Nutritional Goals: (per RD note on  1/11) 2150-2350 kCal and 115-125 gm protein per day  Current Nutrition:  Cyclic TPN over 14 hrs  Clear liq diet  Plan:  - taper to 1 liter TPN and change formula to Clinimix E 5/20 x 1 day due to Clinimix shortage, hope to stop TPN completely on 1/14 -  Cycle TPN over 14 hrs: Clinimix E 5/20 infuse 1L over 14 hours: 50 ml/hr x 1 hr, then 75 ml/hr x 12 hrs, then 50 ml/hr x 1 hr - Lipids at 17 ml/hr x 14 hours (total 240 ml/day) - TPN will provide 1380 kCal and 50 gm of protein per day, meeting 64% of kCal and 43% of protein needs.   - change to PO MVI - Continue moderate SSI to q6h (09:00, 16:00, 20:00, 23:00) and  15 units of regular insulin in TPN  MEudelia Bunch Pharm.D. [phone]/13/2018 9:25 AM

## 2016-07-23 NOTE — Progress Notes (Signed)
PROGRESS NOTE  GLEN KESINGER BPZ:[id number] DOB: 06-04-1941 DOA: 07/13/2016 PCP: [name], MD   LOS: 10 days   Brief Narrative: 76 year old man with history significant for MM and stage IV NSCLC now with mets to brain discovered 04/2016 undergoing radiotherapy to brain lesions and Ketruda, pulmonary embolism on Xarelto. He has been admitted several times in the past few months, initially for SBO back in October for SBO, which resolved with conservative therapy. He was deemed a poor surgical candidate, but it was felt that if he could regain some strength he could undergo surgery. He was admitted again 12/15 with projectile vomiting and SOB. He was then found to have GNR bacteremia. Course became complicated by shock and DVT while he was off DOAC for colitis related GIB. IVC filter placed. He was started on TPN 1/2 and transferred to CIR 1/3. Later that day he developed vomiting again where and was suspected to have aspirated developing hypoxia and significant respiratory distress. Patient admitted with Recurrent SBO and acute hypoxic respiratory failure secondary to aspiration PNA. He has been receiving TPN, IV antibiotics. He still has NG tube, surgery has been following and patient is s/p laparoscopic LOA 1/9  Assessment & Plan: Active Problems:   Multiple myeloma (HCC)   DVT of leg (deep venous thrombosis) (HCC)   History of pulmonary embolism   Peripheral edema   SBO (small bowel obstruction)   Diabetes insipidus (HCC)   Thrombocytopenia (HCC)   Hypoalbuminemia due to protein-calorie malnutrition (HCC)   Primary malignant neoplasm of lung metastatic to other site [facility])   Pressure injury of skin   Aspiration into airway   Goals of care, counseling/discussion   Palliative care by specialist   Recurrent SBO - Gen. surgery was consulted, patient was taken to the operating room on 1/9 and he is now status post laparoscopic lysis of adhesions. Continue parenteral nutrition, appreciate  surgical consultation - now having BMs, slowly advancing diet per surgery   Acute hypoxic Respiratory failure, secondary to possible aspiration event / Aspiration pneumonitis versus pneumonia - suplemmental oxygen as needed to keep O2 sats > 88% - was on IV ceftriaxone and flagyl, discontinued 1/11 as he had completed 8 days> Afebrile and stable since.   NSCLC (80% PDL1+)-->treating w/ Keytruda > - Dr. [name] is primary oncologist H/o Multiple Myeloma w/ skull lesion s/p XRT 2016 in remission.   H/o PE and DVT on Eliquis-->new DVTs on NOAC 12/21 > S/P temp filter 12/1 - H/O HIT  - On Argatroban. Continue for now. Resume NOAC when able to take consistently PO, likely tomorrow  Thrombocytopenia, acute blood loss anemia. - Now on argatroban.  - HIT panel; repeated 1-05; at 0.248 - Hb 7.0 1/11, transfused 1U pRBC. No bleeding noted - CBC this morning is stable, Hb 7.9. Continue to monitor   Chronic diastolic HF / hypotension  - Monitor volume status, he appears to have significant lower extremity edema and generalized anasarca, a component of hypoalbuminemia due to malnutrition is present as well.  - Repeat Lasix iv today - added midodrine last night, continue   Transaminases - trending down.   Hypoalbuminemia  - Pre-albumin 18 - Continuee with TPN, wean off if he is tolerating diet advancement   AKI - resolved, continue to monitor  Hyponatremia - resolved.   Addison diseases - decadron, Desmopressin.  - hypotensive, added midodrine  Stage 2 sacral ulcer noted 2x2 cms - Wound care consult   DVT prophylaxis: argatroban  Code Status: DNR Family  Communication: no family bedside Disposition Plan: TBD  Consultants:   General surgery   Oncology   Procedures:   Laparoscopic LOA 1/9  Antimicrobials:  Ceftriaxone 1/3 >> 1/11  Flagyl 1/3 >> 1/11  Subjective: - feels good, happy to have NG tube out. + BMs  Objective: Vitals:   07/23/16 0721 07/23/16  0900 07/23/16 0903 07/23/16 1100  BP: 97/70   100/76  Pulse: 87   86  Resp: 16  16 (!) 24  Temp: 97.5 F (36.4 C)     TempSrc: Oral     SpO2: 99%  100% 99%  Weight:  97.7 kg (215 lb 6.2 oz)    Height:        Intake/Output Summary (Last 24 hours) at 07/23/16 1142 Last data filed at 07/23/16 1100  Gross per 24 hour  Intake          3046.61 ml  Output             2225 ml  Net           821.61 ml   Filed Weights   07/13/16 2130 07/23/16 0900  Weight: 92.6 kg (204 lb 2.3 oz) 97.7 kg (215 lb 6.2 oz)    Examination: Constitutional: NAD Vitals:   07/23/16 0721 07/23/16 0900 07/23/16 0903 07/23/16 1100  BP: 97/70   100/76  Pulse: 87   86  Resp: 16  16 (!) 24  Temp: 97.5 F (36.4 C)     TempSrc: Oral     SpO2: 99%  100% 99%  Weight:  97.7 kg (215 lb 6.2 oz)    Height:       Eyes: PERRL, lids and conjunctivae normal Respiratory: clear to auscultation bilaterally, no wheezing, no crackles. Cardiovascular: Regular rate and rhythm, no murmurs / rubs / gallops. 3+ pitting LE edema Abdomen: no tenderness. Bowel sounds decreased. Incision dressing c/d/i Musculoskeletal: no clubbing / cyanosis. Neurologic: non focal    Data Reviewed: I have personally reviewed following labs and imaging studies  CBC:  Recent Labs Lab 07/18/16 0656  07/19/16 1157 07/20/16 0216 07/21/16 0430 07/22/16 0500 07/23/16 0338  WBC 9.3  < > 7.6 7.8 9.5 13.9* 13.5*  NEUTROABS 8.7*  --  7.1  --   --   --   --   HGB 8.1*  < > 8.7* 8.1* 7.0* 8.9* 7.9*  HCT 24.2*  < > 26.9* 24.9* 21.7* 27.3* 24.3*  MCV 91.3  < > 92.8 94.0 95.6 91.6 93.8  PLT 38*  < > 75* 59* 38* 47* 47*  < > = values in this interval not displayed. Basic Metabolic Panel:  Recent Labs Lab 07/18/16 0656 07/19/16 0245 07/20/16 0216 07/21/16 0430 07/22/16 0500  NA 142 140 140 141 143  K 3.5 4.0 3.9 3.8 4.0  CL 112* 111 111 114* 110  CO2 _0 21* 25  GLUCOSE 186* 135* 145* 197* 115*  BUN 36* 31* 27* 24* 23*  CREATININE  0.66 0.70 0.85 0.55* 0.57*  CALCIUM 7.6* 7.7* 7.3* 7.5* 8.1*  MG 1.7 2.0  --  1.8 2.1  PHOS 2.8  --   --  2.3* 3.5   GFR: Estimated Creatinine Clearance: 90.4 mL/min (by C-G formula based on SCr of 0.57 mg/dL (L)). Liver Function Tests:  Recent Labs Lab 07/17/16 0257 07/18/16 0656 07/21/16 0430  AST 48* 32 16  ALT 110* 82* 31  ALKPHOS 71 74 64  BILITOT <0.1* 0.6 0.4  PROT 3.4* 3.3* 3.5*  ALBUMIN 1.4* 1.4* 1.4*   No results for input(s): LIPASE, AMYLASE in the last 168 hours. No results for input(s): AMMONIA in the last 168 hours. Coagulation Profile: No results for input(s): INR, PROTIME in the last 168 hours. Cardiac Enzymes: No results for input(s): CKTOTAL, CKMB, CKMBINDEX, TROPONINI in the last 168 hours. BNP (last 3 results) No results for input(s): PROBNP in the last 8760 hours. HbA1C: No results for input(s): HGBA1C in the last 72 hours. CBG:  Recent Labs Lab 07/22/16 1206 07/22/16 1556 07/22/16 2015 07/22/16 2350 07/23/16 0415  GLUCAP 115* 130* 157* 149* 152*   Lipid Profile: No results for input(s): CHOL, HDL, LDLCALC, TRIG, CHOLHDL, LDLDIRECT in the last 72 hours. Thyroid Function Tests: No results for input(s): TSH, T4TOTAL, FREET4, T3FREE, THYROIDAB in the last 72 hours. Anemia Panel: No results for input(s): VITAMINB12, FOLATE, FERRITIN, TIBC, IRON, RETICCTPCT in the last 72 hours. Urine analysis:    Component Value Date/Time   COLORURINE YELLOW 06/29/2016 2121   APPEARANCEUR CLEAR 06/29/2016 2121   LABSPEC 1.014 06/29/2016 2121   LABSPEC 1.005 05/09/2016 0937   PHURINE 7.0 06/29/2016 2121   GLUCOSEU NEGATIVE 06/29/2016 2121   GLUCOSEU Negative 05/09/2016 0937   HGBUR NEGATIVE 06/29/2016 2121   BILIRUBINUR NEGATIVE 06/29/2016 2121   [place] Negative 05/09/2016 [place] 06/29/2016 2121   PROTEINUR NEGATIVE 06/29/2016 2121   UROBILINOGEN 0.2 05/09/2016 0937   NITRITE NEGATIVE 06/29/2016 2121   LEUKOCYTESUR NEGATIVE  06/29/2016 2121   LEUKOCYTESUR Negative 05/09/2016 3716   Radiology Studies: No results found. Scheduled Meds: . sodium chloride   Intravenous Once  . alteplase  2 mg Intracatheter Once  . desmopressin  10 mcg Nasal QHS  . dexamethasone  4 mg Intravenous Q12H  . insulin aspart  0-15 Units Subcutaneous 4 times per day  . mouth rinse  15 mL Mouth Rinse BID  . midodrine  5 mg Oral TID WC  . multivitamin with minerals  1 tablet Oral Daily  . pantoprazole (PROTONIX) IV  40 mg Intravenous Q12H   Continuous Infusions: . sodium chloride 10 mL/hr at 07/23/16 0721  . argatroban 1.1 mcg/kg/min (07/23/16 1100)  . Marland KitchenTPN (CLINIMIX-E) Adult     And  . fat emulsion      Marzetta Board, MD, PhD [facility] Pager [phone]  If 7PM-7AM, please contact night-coverage [email or web address] Password [facility] 07/23/2016, 11:42 AM

## 2016-07-24 DIAGNOSIS — E46 Unspecified protein-calorie malnutrition: Secondary | ICD-10-CM

## 2016-07-24 DIAGNOSIS — R609 Edema, unspecified: Secondary | ICD-10-CM

## 2016-07-24 LAB — APTT: APTT: 60 s — AB (ref 24–36)

## 2016-07-24 LAB — GLUCOSE, CAPILLARY
GLUCOSE-CAPILLARY: 137 mg/dL — AB (ref 65–99)
GLUCOSE-CAPILLARY: 106 mg/dL — AB (ref 65–99)
GLUCOSE-CAPILLARY: 97 mg/dL (ref 65–99)
Glucose-Capillary: 106 mg/dL — ABNORMAL HIGH (ref 65–99)
Glucose-Capillary: 147 mg/dL — ABNORMAL HIGH (ref 65–99)
Glucose-Capillary: 149 mg/dL — ABNORMAL HIGH (ref 65–99)

## 2016-07-24 LAB — COMPREHENSIVE METABOLIC PANEL
ALT: 20 U/L (ref 17–63)
AST: 16 U/L (ref 15–41)
Albumin: 1.4 g/dL — ABNORMAL LOW (ref 3.5–5.0)
Alkaline Phosphatase: 86 U/L (ref 38–126)
Anion gap: 5 (ref 5–15)
BUN: 28 mg/dL — AB (ref 6–20)
CHLORIDE: 108 mmol/L (ref 101–111)
CO2: 24 mmol/L (ref 22–32)
Calcium: 7.8 mg/dL — ABNORMAL LOW (ref 8.9–10.3)
Creatinine, Ser: 0.55 mg/dL — ABNORMAL LOW (ref 0.61–1.24)
GFR calc Af Amer: >60 mL/min (ref >60)
GLUCOSE: 140 mg/dL — AB (ref 65–99)
POTASSIUM: 4 mmol/L (ref 3.5–5.1)
SODIUM: 137 mmol/L (ref 135–145)
Total Bilirubin: 0.5 mg/dL (ref 0.3–1.2)
Total Protein: 3.9 g/dL — ABNORMAL LOW (ref 6.5–8.1)

## 2016-07-24 LAB — CBC
HCT: 23.3 % — ABNORMAL LOW (ref 39.0–52.0)
Hemoglobin: 7.6 g/dL — ABNORMAL LOW (ref 13.0–17.0)
MCH: 30.9 pg (ref 26.0–34.0)
MCHC: 32.6 g/dL (ref 30.0–36.0)
MCV: 94.7 fL (ref 78.0–100.0)
PLATELETS: 52 10*3/uL — AB (ref 150–400)
RBC: 2.46 MIL/uL — AB (ref 4.22–5.81)
RDW: 21 % — ABNORMAL HIGH (ref 11.5–15.5)
WBC: 12.7 10*3/uL — AB (ref 4.0–10.5)

## 2016-07-24 MED ORDER — FUROSEMIDE 10 MG/ML IJ SOLN
20.0000 mg | Freq: Once | INTRAMUSCULAR | Status: AC
  Administered 2016-07-24: 20 mg via INTRAVENOUS
  Filled 2016-07-24: qty 2

## 2016-07-24 MED ORDER — PANTOPRAZOLE SODIUM 40 MG PO TBEC
40.0000 mg | DELAYED_RELEASE_TABLET | Freq: Two times a day (BID) | ORAL | Status: DC
  Administered 2016-07-24 – 2016-07-29 (×8): 40 mg via ORAL
  Filled 2016-07-24 (×9): qty 1

## 2016-07-24 MED ORDER — MIDODRINE HCL 5 MG PO TABS
10.0000 mg | ORAL_TABLET | Freq: Three times a day (TID) | ORAL | Status: DC
  Administered 2016-07-24 – 2016-07-26 (×6): 10 mg via ORAL
  Filled 2016-07-24 (×6): qty 2

## 2016-07-24 MED ORDER — INSULIN ASPART 100 UNIT/ML ~~LOC~~ SOLN
0.0000 [IU] | Freq: Three times a day (TID) | SUBCUTANEOUS | Status: DC
  Administered 2016-07-24: 2 [IU] via SUBCUTANEOUS
  Administered 2016-07-25: 3 [IU] via SUBCUTANEOUS
  Administered 2016-07-25: 2 [IU] via SUBCUTANEOUS
  Administered 2016-07-26: 3 [IU] via SUBCUTANEOUS
  Administered 2016-07-26 – 2016-07-28 (×4): 2 [IU] via SUBCUTANEOUS

## 2016-07-24 NOTE — Progress Notes (Signed)
ANTICOAGULATION CONSULT NOTE - Follow Up Consult  Pharmacy Consult for Argatroban Indication: Hx of VTE (DVT and PE) - new DVT on NOAC 12/21  Allergies  Allergen Reactions  . Tramadol Palpitations    "Made my chest feel tight"  . Amoxicillin Rash  . Penicillins Rash    Tolerates Rocephin Jan 2018.  TDD.  Has patient had a PCN reaction causing immediate rash, facial/tongue/throat swelling, SOB or lightheadedness with hypotension: unknown Has patient had a PCN reaction causing severe rash involving mucus membranes or skin necrosis: unknown Has patient had a PCN reaction that required hospitalization: no  Has patient had a PCN reaction occurring within the last 10 years: no  If all of the above answers are "NO", then may proceed with Cephalosporin use.    Patient Measurements: Height: '5\' 8"'$  (172.7 cm) Weight: 215 lb 6.2 oz (97.7 kg) IBW/kg (Calculated) : 68.4  Vital Signs: Temp: 97.7 F (36.5 C) (01/14 0408) Temp Source: Oral (01/14 0408) BP: 88/64 (01/14 0400) Pulse Rate: 75 (01/13 2351)  Labs:  Recent Labs  07/22/16 0500 07/23/16 0338 07/24/16 0256  HGB 8.9* 7.9* 7.6*  HCT 27.3* 24.3* 23.3*  PLT 47* 47* 52*  APTT 57* 64* 60*  CREATININE 0.57*  --  0.55*    Estimated Creatinine Clearance: 90.4 mL/min (by C-G formula based on SCr of 0.55 mg/dL (L)).   Assessment: 76 y/o M with metastatic NSCLC, was on argatroban in past due to concern for HIT, SRA was negative.  S/p exlap with small bowel resection on 1/9. Argatroban resumed on 1/10.   APTT remains therapeutic this morning. Hgb downtrending, but trend stable (7 > 8.9 > 7.9 > 7.6). Plt low, but stable at 52. No s/s bleeding noted.   Goal of Therapy:  aPTT 50-90  seconds Monitor platelets by anticoagulation protocol: Yes   Plan:  1. Continue argatroban at 1.1 mcg/kg/min or 6.1 ml/hr 2. Daily aPTT for monitoring  3. Consider transitioning from Argatroban to Arixtra 7.5 mg subcutaneously every 24 hours until  reliably taking PO then can go back on oral anticoagulant.   Argie Ramming, PharmD Pharmacy Resident  Pager [phone] 07/24/16 6:57 AM

## 2016-07-24 NOTE — Progress Notes (Signed)
PROGRESS NOTE  [name]:[id number] DOB: 08/15/1940 DOA: 07/13/2016 PCP: [name], MD   LOS: 11 days   Brief Narrative: 76 year old man with history significant for MM and stage IV NSCLC now with mets to brain discovered 04/2016 undergoing radiotherapy to brain lesions and Ketruda, pulmonary embolism on Xarelto. He has been admitted several times in the past few months, initially for SBO back in October for SBO, which resolved with conservative therapy. He was deemed a poor surgical candidate, but it was felt that if he could regain some strength he could undergo surgery. He was admitted again 12/15 with projectile vomiting and SOB. He was then found to have GNR bacteremia. Course became complicated by shock and DVT while he was off DOAC for colitis related GIB. IVC filter placed. He was started on TPN 1/2 and transferred to CIR 1/3. Later that day he developed vomiting again where and was suspected to have aspirated developing hypoxia and significant respiratory distress. Patient admitted with Recurrent SBO and acute hypoxic respiratory failure secondary to aspiration PNA, now s/p laparoscopic LOA 1/9  Assessment & Plan: Active Problems:   Multiple myeloma (HCC)   DVT of leg (deep venous thrombosis) (HCC)   History of pulmonary embolism   Peripheral edema   SBO (small bowel obstruction)   Diabetes insipidus (HCC)   Thrombocytopenia (HCC)   Hypoalbuminemia due to protein-calorie malnutrition (HCC)   Primary malignant neoplasm of lung metastatic to other site [facility])   Pressure injury of skin   Aspiration into airway   Goals of care, counseling/discussion   Palliative care by specialist   Recurrent SBO - Gen. surgery was consulted, patient was taken to the operating room on 1/9 and he is now status post laparoscopic lysis of adhesions. Continue parenteral nutrition, appreciate surgical consultation, wean off if he has adequate by mouth intake - now having BMs, advancing diet per  surgery   Anasarca/fluid overload - In the setting of malnutrition and IV fluids, continue IV Lasix, continue midodrine to support blood pressure  Acute hypoxic Respiratory failure, secondary to possible aspiration event / Aspiration pneumonitis versus pneumonia - suplemmental oxygen as needed to keep O2 sats > 88% - was on IV ceftriaxone and flagyl, discontinued 1/11 as he had completed 8 days> Afebrile and stable since.   NSCLC (80% PDL1+)-->treating w/ Keytruda > - Dr. [name] is primary oncologist H/o Multiple Myeloma w/ skull lesion s/p XRT 2016 in remission.   H/o PE and DVT on Eliquis-->new DVTs on NOAC 12/21 > S/P temp filter 12/1 - H/O HIT  - On Argatroban. Continue for now. Resume NOAC tomorrow  Thrombocytopenia, acute blood loss anemia. - Now on argatroban.  - HIT panel; repeated 1-05; at 0.248 - Hb 7.0 1/11, transfused 1U pRBC. No bleeding noted - CBC this morning stable, Hb 7.6  Chronic diastolic HF / hypotension  - Monitor volume status, he appears to have significant lower extremity edema and generalized anasarca, a component of hypoalbuminemia due to malnutrition is present as well.  - Repeat Lasix iv today - added midodrine last night, continue, increase dose  Transaminases - trending down, Now normalized  Hypoalbuminemia  - Pre-albumin 18 - Continuee with TPN, wean off if he is tolerating diet advancement   AKI - resolved, continue to monitor  Hyponatremia - resolved.   Addison diseases - decadron, Desmopressin.  - hypotensive, added midodrine  Stage 2 sacral ulcer noted 2x2 cms - Wound care consult   DVT prophylaxis: argatroban  Code Status: DNR  Family Communication: no family bedside, d/w wife 1/13 Disposition Plan: TBD  Consultants:   General surgery   Oncology   Procedures:   Laparoscopic LOA 1/9  Antimicrobials:  Ceftriaxone 1/3 >> 1/11  Flagyl 1/3 >> 1/11  Subjective: - feels good, + BMs. No chest pain, shortness of  breath, lightheadedness or dizziness   Objective: Vitals:   07/24/16 0408 07/24/16 0729 07/24/16 0800 07/24/16 1100  BP:  (!) '88/53 92/64 96/74 '$  Pulse:  73 73 92  Resp:  '12 10 16  '$ Temp: 97.7 F (36.5 C) 97.9 F (36.6 C)    TempSrc: Oral Oral    SpO2:  99% 99% 97%  Weight:   97.3 kg (214 lb 8.1 oz)   Height:        Intake/Output Summary (Last 24 hours) at 07/24/16 1122 Last data filed at 07/24/16 1108  Gross per 24 hour  Intake          2360.27 ml  Output             1050 ml  Net          1310.27 ml   Filed Weights   07/13/16 2130 07/23/16 0900 07/24/16 0800  Weight: 92.6 kg (204 lb 2.3 oz) 97.7 kg (215 lb 6.2 oz) 97.3 kg (214 lb 8.1 oz)    Examination: Constitutional: NAD Vitals:   07/24/16 0408 07/24/16 0729 07/24/16 0800 07/24/16 1100  BP:  (!) '88/53 92/64 96/74 '$  Pulse:  73 73 92  Resp:  '12 10 16  '$ Temp: 97.7 F (36.5 C) 97.9 F (36.6 C)    TempSrc: Oral Oral    SpO2:  99% 99% 97%  Weight:   97.3 kg (214 lb 8.1 oz)   Height:       Eyes: PERRL, lids and conjunctivae normal Respiratory: clear to auscultation bilaterally, no wheezing, no crackles. Cardiovascular: Regular rate and rhythm, no murmurs / rubs / gallops. 3+ pitting LE edema Abdomen: no tenderness. Bowel sounds decreased. Incision dressing c/d/i Musculoskeletal: no clubbing / cyanosis. Neurologic: non focal    Data Reviewed: I have personally reviewed following labs and imaging studies  CBC:  Recent Labs Lab 07/18/16 0656  07/19/16 1157 07/20/16 0216 07/21/16 0430 07/22/16 0500 07/23/16 0338 07/24/16 0256  WBC 9.3  < > 7.6 7.8 9.5 13.9* 13.5* 12.7*  NEUTROABS 8.7*  --  7.1  --   --   --   --   --   HGB 8.1*  < > 8.7* 8.1* 7.0* 8.9* 7.9* 7.6*  HCT 24.2*  < > 26.9* 24.9* 21.7* 27.3* 24.3* 23.3*  MCV 91.3  < > 92.8 94.0 95.6 91.6 93.8 94.7  PLT 38*  < > 75* 59* 38* 47* 47* 52*  < > = values in this interval not displayed. Basic Metabolic Panel:  Recent Labs Lab 07/18/16 0656  07/19/16 0245 07/20/16 0216 07/21/16 0430 07/22/16 0500 07/24/16 0256  NA 142 140 140 141 143 137  K 3.5 4.0 3.9 3.8 4.0 4.0  CL 112* 111 111 114* 110 108  CO2 '24 24 22 '$ 21* 25 24  GLUCOSE 186* 135* 145* 197* 115* 140*  BUN 36* 31* 27* 24* 23* 28*  CREATININE 0.66 0.70 0.85 0.55* 0.57* 0.55*  CALCIUM 7.6* 7.7* 7.3* 7.5* 8.1* 7.8*  MG 1.7 2.0  --  1.8 2.1  --   PHOS 2.8  --   --  2.3* 3.5  --    GFR: Estimated Creatinine Clearance: 90.3 mL/min (by C-G  formula based on SCr of 0.55 mg/dL (L)). Liver Function Tests:  Recent Labs Lab 07/18/16 0656 07/21/16 0430 07/24/16 0256  AST 32 16 16  ALT 82* 31 20  ALKPHOS 74 64 86  BILITOT 0.6 0.4 0.5  PROT 3.3* 3.5* 3.9*  ALBUMIN 1.4* 1.4* 1.4*   No results for input(s): LIPASE, AMYLASE in the last 168 hours. No results for input(s): AMMONIA in the last 168 hours. Coagulation Profile: No results for input(s): INR, PROTIME in the last 168 hours. Cardiac Enzymes: No results for input(s): CKTOTAL, CKMB, CKMBINDEX, TROPONINI in the last 168 hours. BNP (last 3 results) No results for input(s): PROBNP in the last 8760 hours. HbA1C: No results for input(s): HGBA1C in the last 72 hours. CBG:  Recent Labs Lab 07/23/16 0415 07/23/16 1545 07/23/16 2028 07/24/16 0025 07/24/16 0812  GLUCAP 152* 157* 154* 149* 147*   Lipid Profile: No results for input(s): CHOL, HDL, LDLCALC, TRIG, CHOLHDL, LDLDIRECT in the last 72 hours. Thyroid Function Tests: No results for input(s): TSH, T4TOTAL, FREET4, T3FREE, THYROIDAB in the last 72 hours. Anemia Panel: No results for input(s): VITAMINB12, FOLATE, FERRITIN, TIBC, IRON, RETICCTPCT in the last 72 hours. Urine analysis:    Component Value Date/Time   COLORURINE YELLOW 06/29/2016 2121   APPEARANCEUR CLEAR 06/29/2016 2121   LABSPEC 1.014 06/29/2016 2121   LABSPEC 1.005 05/09/2016 0937   PHURINE 7.0 06/29/2016 2121   GLUCOSEU NEGATIVE 06/29/2016 2121   GLUCOSEU Negative 05/09/2016 0937    HGBUR NEGATIVE 06/29/2016 2121   BILIRUBINUR NEGATIVE 06/29/2016 2121   [place] Negative 05/09/2016 [place] 06/29/2016 2121   PROTEINUR NEGATIVE 06/29/2016 2121   UROBILINOGEN 0.2 05/09/2016 0937   NITRITE NEGATIVE 06/29/2016 2121   LEUKOCYTESUR NEGATIVE 06/29/2016 2121   LEUKOCYTESUR Negative 05/09/2016 8350   Radiology Studies: No results found. Scheduled Meds: . sodium chloride   Intravenous Once  . alteplase  2 mg Intracatheter Once  . desmopressin  10 mcg Nasal QHS  . dexamethasone  4 mg Intravenous Q12H  . insulin aspart  0-15 Units Subcutaneous 4 times per day  . mouth rinse  15 mL Mouth Rinse BID  . midodrine  10 mg Oral TID WC  . multivitamin with minerals  1 tablet Oral Daily  . pantoprazole (PROTONIX) IV  40 mg Intravenous Q12H   Continuous Infusions: . sodium chloride 10 mL/hr at 07/23/16 1900  . argatroban 1.1 mcg/kg/min (07/24/16 1108)    Marzetta Board, MD, PhD [facility] Pager [phone]  If 7PM-7AM, please contact night-coverage [email or web address] Password [email or web address] 07/24/2016, 11:22 AM

## 2016-07-24 NOTE — Progress Notes (Signed)
Patient ID: [name], male   DOB: 1940/09/06, 76 y.o.   MRN: [id number]  [facility] Surgery Progress Note  5 Days Post-Op  Subjective: Feeling great this morning. Denies any current abdominal pain, nausea, or vomiting. Tolerating full liquids. Had a BM yesterday. Working well with PT.  Objective: Vital signs in last 24 hours: Temp:  [96.8 F (36 C)-98.5 F (36.9 C)] 97.9 F (36.6 C) (01/14 0729) Pulse Rate:  [73-92] 92 (01/14 1100) Resp:  [10-19] 16 (01/14 1100) BP: (75-96)/(53-74) 96/74 (01/14 1100) SpO2:  [94 %-99 %] 97 % (01/14 1100) Weight:  [214 lb 8.1 oz (97.3 kg)] 214 lb 8.1 oz (97.3 kg) (01/14 0800) Last BM Date: 07/22/16  Intake/Output from previous day: 01/13 0701 - 01/14 0700 In: 3995.7 [P.O.:360; I.V.:3635.7] Out: 1325 [Urine:1325] Intake/Output this shift: Total I/O In: 674.1 [P.O.:480; I.V.:194.1] Out: 300 [Urine:300]  PE: Gen: Alert, NAD, pleasant Pulm: effort normal Abd: Soft, ND, nontender, +BS, midline incision C/D/I   Lab Results:   Recent Labs  07/23/16 0338 07/24/16 0256  WBC 13.5* 12.7*  HGB 7.9* 7.6*  HCT 24.3* 23.3*  PLT 47* 52*   BMET  Recent Labs  07/22/16 0500 07/24/16 0256  NA 143 137  K 4.0 4.0  CL 110 108  CO2 25 24  GLUCOSE 115* 140*  BUN 23* 28*  CREATININE 0.57* 0.55*  CALCIUM 8.1* 7.8*   PT/INR No results for input(s): LABPROT, INR in the last 72 hours. CMP     Component Value Date/Time   NA 137 07/24/2016 0256   NA 132 (L) 06/15/2016 0852   K 4.0 07/24/2016 0256   K 4.1 06/15/2016 0852   CL 108 07/24/2016 0256   CL 109 (H) 12/31/2012 0759   CO2 24 07/24/2016 0256   CO2 23 06/15/2016 0852   GLUCOSE 140 (H) 07/24/2016 0256   GLUCOSE 101 06/15/2016 0852   GLUCOSE 116 (H) 12/31/2012 0759   BUN 28 (H) 07/24/2016 0256   BUN 19.0 06/15/2016 0852   CREATININE 0.55 (L) 07/24/2016 0256   CREATININE 0.8 06/15/2016 0852   CALCIUM 7.8 (L) 07/24/2016 0256   CALCIUM 8.5 06/15/2016 0852   PROT 3.9 (L)  07/24/2016 0256   PROT 5.6 (L) 06/15/2016 0852   ALBUMIN 1.4 (L) 07/24/2016 0256   ALBUMIN 2.8 (L) 06/15/2016 0852   AST 16 07/24/2016 0256   AST 10 06/15/2016 0852   ALT 20 07/24/2016 0256   ALT 27 06/15/2016 0852   ALKPHOS 86 07/24/2016 0256   ALKPHOS 77 06/15/2016 0852   BILITOT 0.5 07/24/2016 0256   BILITOT 0.99 06/15/2016 0852   GFRNONAA >60 07/24/2016 0256   GFRAA >60 07/24/2016 0256   Lipase     Component Value Date/Time   LIPASE 21 06/24/2016 1622       Studies/Results: No results found.  Anti-infectives: Anti-infectives    Start     Dose/Rate Route Frequency Ordered Stop   07/19/16 0800  cefoTEtan (CEFOTAN) 1 g in dextrose 5 % 50 mL IVPB     1 g 100 mL/hr over 30 Minutes Intravenous To ShortStay Surgical 07/18/16 1112 07/19/16 1518   07/14/16 2200  cefTRIAXone (ROCEPHIN) 2 g in dextrose 5 % 50 mL IVPB  Status:  Discontinued     2 g 100 mL/hr over 30 Minutes Intravenous Every 24 hours 07/14/16 0915 07/21/16 1309   07/13/16 2200  cefTRIAXone (ROCEPHIN) 1 g in dextrose 5 % 50 mL IVPB  Status:  Discontinued     1  g 100 mL/hr over 30 Minutes Intravenous Every 24 hours 07/13/16 2132 07/14/16 0915   07/13/16 2200  metroNIDAZOLE (FLAGYL) IVPB 500 mg  Status:  Discontinued     500 mg 100 mL/hr over 60 Minutes Intravenous Every 8 hours 07/13/16 2132 07/21/16 1309       Assessment/Plan Recurrent SBO S/p Lap assisted lysis of adhesions, small bowel resection, and laparoscopic resection of Meckel's diverticulum1/9 Dr. Barry [name] - POD 5 - path pending - full liquids - WBC trending down, 12.7; afebrile  Acute hypoxic respiratory failure secondary to possible aspiration event / Aspiration pneumonitis versus pneumonia- supplemental O2, stable Thrombocytopenia, ABLA- Hg 7.6, stable. Received 1 uPRBC 1/11. Transfusions per IM NSCLC- being treated with Keytruda, Dr. Julien [name] Multiple myeloma with skull lesion - in remission Hx PE on Eliquis, new DVT on NOAC s/p filter  - held, h/o HIT, on argatroban Stage 2 sacral ulcer- per wound care  ID - ceftriaxone 1/3>>1/11, flagyl 1/3>>1/11 FEN - soft diet, wean TPN VTE - argatroban  Code status - DNR  Plan - Advance to soft diet. Ok to start weaning off TPN.              Continue therapies/mobility. Patient hoping to go to CIR in next 1-2 days             Path pending.  Ok to restart xarelto from surgical standpoint.   LOS: 11 days    [name] , [facility] Surgery 07/24/2016, 11:22 AM Pager: [phone]  Agree with above.  Hector Overall, MD, [facility] Surgery Pager: [phone] Office phone:  [phone]

## 2016-07-24 NOTE — Progress Notes (Addendum)
PHARMACY - ADULT TOTAL PARENTERAL NUTRITION CONSULT NOTE   Pharmacy Consult:  TPN Indication:  SBO  Patient Measurements: Height: _0  (172.7 cm) Weight: 214 lb 8.1 oz (97.3 kg) IBW/kg (Calculated) : 68.4 TPN AdjBW (KG): 74.5 Body mass index is 32.62 kg/m.   Assessment:  33 YOM with recent admission on 11/26-12/4/17 for SBO, re-admitted to [facility] on 06/24/16 with PE and recurrent SBO. Patient was unable to tolerate fluids and had continued nausea and vomiting.  NG tube placement failed multiple times and Pharmacy was consulted to manage TPN for nutritional support.  Patient transferred to [facility] on 07/14/15.  Per documentation, his intake was </= 75% for 1 month PTA.  Now with severe malnutrition.  Surgery was delayed because patient was too debilitated.   QM:GNOIBBCWUG 10.7 > 6.5> 18.5 -now WNL.  OR 07/19/16: lap LOA, removal meckel's diverticulum, small bowel resection for ? Contained perforation.  + BM 1/12, NTG out 1/12.  diet advance to full liq yesterday - well tolerated per RN.    Endo: no hx DM - 15 units insulin/bag  (on Decadron 29m BID).  DDAVP for diabetes insipidus. CGS 150s during TPN infusion Insulin requirements during TPN infusion: 25 units in TPN + 4 units SSI Lytes: lytes WNL *day without lytes: 1/4, 1/5, 1/6 Renal: BPH.  AKI resolved - SCr down to wnl, lasix 20 IV x 1 per MD 1/13 Pulm: 4L [place] Cards:  not on med AC: Xarelto PTA for hx PE/DVT > transitioned to argatroban and then Eliquis, bleeding on AC so received IVC filter, then resumed Xarelto and now on argatroban again - hgb 7.6, plts 52 Onc: MM / SCC / metastatic lung cancer on immunotherapy with Ketruda (on hold) Hepatobil: AST/ALT WNL, alk phos and tbili WNL.  TG WNL Neuro: DJD - A&O ID: off abx- afebrile, WBC 12.7 (decadron) Best Practices: argatroban TPN Access: right CVC port placed 10/30/12 TPN start date: 07/09/16  Nutritional Goals: (per RD note on 1/11) 2150-2350 kCal and 115-125 gm protein per  day  Current Nutrition:  Cyclic TPN over 14 hrs - reduced to 1 L 1/13 full liq diet  Plan:  DC TPN, RN informed PO MVI PO PPI DC TPN labs Change SSI/CBGs to ACHS -no Hx DM but on decadron IV (?change to PO?)  Consider transitioning from Argatroban to Arixtra 7.5 mg subcutaneously every 24 hours until reliably taking PO then can go back on oral anticoagulant.  MEudelia Bunch Pharm.D. [id number]/14/2018 11:17 AM

## 2016-07-25 LAB — COMPREHENSIVE METABOLIC PANEL
ALBUMIN: 1.4 g/dL — AB (ref 3.5–5.0)
ALK PHOS: 99 U/L (ref 38–126)
ALT: 24 U/L (ref 17–63)
AST: 18 U/L (ref 15–41)
Anion gap: 7 (ref 5–15)
BILIRUBIN TOTAL: 0.5 mg/dL (ref 0.3–1.2)
BUN: 25 mg/dL — AB (ref 6–20)
CO2: 25 mmol/L (ref 22–32)
Calcium: 8 mg/dL — ABNORMAL LOW (ref 8.9–10.3)
Chloride: 108 mmol/L (ref 101–111)
Creatinine, Ser: 0.62 mg/dL (ref 0.61–1.24)
GFR calc Af Amer: >60 mL/min (ref >60)
GLUCOSE: 109 mg/dL — AB (ref 65–99)
Potassium: 3.9 mmol/L (ref 3.5–5.1)
Sodium: 140 mmol/L (ref 135–145)
TOTAL PROTEIN: 3.8 g/dL — AB (ref 6.5–8.1)

## 2016-07-25 LAB — GLUCOSE, CAPILLARY
GLUCOSE-CAPILLARY: 154 mg/dL — AB (ref 65–99)
GLUCOSE-CAPILLARY: 140 mg/dL — AB (ref 65–99)
GLUCOSE-CAPILLARY: 179 mg/dL — AB (ref 65–99)
Glucose-Capillary: 91 mg/dL (ref 65–99)
Glucose-Capillary: 116 mg/dL — ABNORMAL HIGH (ref 65–99)
Glucose-Capillary: 128 mg/dL — ABNORMAL HIGH (ref 65–99)
Glucose-Capillary: 137 mg/dL — ABNORMAL HIGH (ref 65–99)

## 2016-07-25 LAB — CBC
HEMATOCRIT: 22.9 % — AB (ref 39.0–52.0)
Hemoglobin: 7.3 g/dL — ABNORMAL LOW (ref 13.0–17.0)
MCH: 30.4 pg (ref 26.0–34.0)
MCHC: 31.9 g/dL (ref 30.0–36.0)
MCV: 95.4 fL (ref 78.0–100.0)
PLATELETS: 52 10*3/uL — AB (ref 150–400)
RBC: 2.4 MIL/uL — AB (ref 4.22–5.81)
RDW: 20.8 % — AB (ref 11.5–15.5)
WBC: 12.6 10*3/uL — AB (ref 4.0–10.5)

## 2016-07-25 LAB — APTT: aPTT: 52 seconds — ABNORMAL HIGH (ref 24–36)

## 2016-07-25 LAB — PREPARE RBC (CROSSMATCH)

## 2016-07-25 MED ORDER — SODIUM CHLORIDE 0.9 % IV SOLN
Freq: Once | INTRAVENOUS | Status: AC
  Administered 2016-07-25: 12:00:00 via INTRAVENOUS

## 2016-07-25 MED ORDER — ARGATROBAN 50 MG/50ML IV SOLN
1.1000 ug/kg/min | INTRAVENOUS | Status: DC
  Administered 2016-07-25 – 2016-07-26 (×4): 1.1 ug/kg/min via INTRAVENOUS
  Filled 2016-07-25 (×4): qty 50

## 2016-07-25 MED ORDER — FUROSEMIDE 40 MG PO TABS
40.0000 mg | ORAL_TABLET | Freq: Every day | ORAL | Status: DC
  Administered 2016-07-25: 40 mg via ORAL
  Filled 2016-07-25: qty 1

## 2016-07-25 NOTE — Progress Notes (Signed)
PROGRESS NOTE  [name] XTA:[id number] DOB: [birth year] DOA: 07/13/2016 PCP: [name], MD   LOS: 12 days   Brief Narrative: 76 year old man with history significant for MM and stage IV NSCLC now with mets to brain discovered 04/2016 undergoing radiotherapy to brain lesions and Ketruda, pulmonary embolism on Xarelto. He has been admitted several times in the past few months, initially for SBO back in October for SBO, which resolved with conservative therapy. He was deemed a poor surgical candidate, but it was felt that if he could regain some strength he could undergo surgery. He was admitted again 12/15 with projectile vomiting and SOB. He was then found to have GNR bacteremia. Course became complicated by shock and DVT while he was off DOAC for colitis related GIB. IVC filter placed. He was started on TPN 1/2 and transferred to CIR 1/3. Later that day he developed vomiting again where and was suspected to have aspirated developing hypoxia and significant respiratory distress. Patient admitted with Recurrent SBO and acute hypoxic respiratory failure secondary to aspiration PNA, now s/p laparoscopic LOA 1/9  Assessment & Plan: Active Problems:   Multiple myeloma (HCC)   DVT of leg (deep venous thrombosis) (HCC)   History of pulmonary embolism   Peripheral edema   SBO (small bowel obstruction)   Diabetes insipidus (HCC)   Thrombocytopenia (HCC)   Hypoalbuminemia due to protein-calorie malnutrition (HCC)   Primary malignant neoplasm of lung metastatic to other site [facility])   Pressure injury of skin   Aspiration into airway   Goals of care, counseling/discussion   Palliative care by specialist   Recurrent SBO - Gen. surgery was consulted, patient was taken to the operating room on 1/9 and he is now status post laparoscopic lysis of adhesions. Continue parenteral nutrition, appreciate surgical consultation, wean off if he has adequate by mouth intake - now having BMs, advancing diet per  surgery   Anasarca/fluid overload - In the setting of malnutrition and IV fluids, continue Lasix, continue midodrine to support blood pressure - transition to po Lasix today   Acute hypoxic Respiratory failure, secondary to possible aspiration event / Aspiration pneumonitis versus pneumonia - suplemmental oxygen as needed to keep O2 sats > 88% - was on IV ceftriaxone and flagyl, discontinued 1/11 as he had completed 8 days> Afebrile and stable since.   NSCLC (80% PDL1+)-->treating w/ Keytruda > - Dr. [name] is primary oncologist H/o Multiple Myeloma w/ skull lesion s/p XRT 2016 in remission.   H/o PE and DVT on Eliquis-->new DVTs on NOAC 12/21 > S/P temp filter 12/1 - H/O HIT  - On Argatroban. Continue for now. Resume NOAC tomorrow  Thrombocytopenia, acute blood loss anemia. - Now on argatroban.  - HIT panel; repeated 1-05; at 0.248 - Hb 7.0 1/11, transfused 1U pRBC. No bleeding noted - CBC this morning with decreased Hb, transfuse 1U pRBC. Will discuss with surgery persistent anemia. ?can we do Xarelto  Chronic diastolic HF / hypotension  - Monitor volume status, he appears to have significant lower extremity edema and generalized anasarca, a component of hypoalbuminemia due to malnutrition is present as well.  - Repeat Lasix iv today - added midodrine last night, continue, increase dose  Transaminases - trending down, Now normalized  Hypoalbuminemia  - Pre-albumin 18 - Continuee with TPN, wean off if he is tolerating diet advancement   AKI - resolved, continue to monitor  Hyponatremia - resolved.   Addison diseases - decadron, Desmopressin.  - hypotensive, added midodrine, continue. Asymptomatic  Stage 2 sacral ulcer noted 2x2 cms - Wound care consult   DVT prophylaxis: argatroban  Code Status: DNR Family Communication: no family bedside Disposition Plan: TBD  Consultants:   General surgery   Oncology   Procedures:   Laparoscopic LOA  1/9  Antimicrobials:  Ceftriaxone 1/3 >> 1/11  Flagyl 1/3 >> 1/11  Subjective: - feels good, + BMs. No chest pain, shortness of breath, lightheadedness or dizziness   Objective: Vitals:   07/24/16 2300 07/25/16 0422 07/25/16 0500 07/25/16 0734  BP: 90/64 (!) 87/55  (!) 86/65  Pulse: 74 78  77  Resp: _0 Temp:  97.5 F (36.4 C)  97.7 F (36.5 C)  TempSrc:  Oral  Oral  SpO2: 99% 97%  98%  Weight:   98.2 kg (216 lb 7.9 oz)   Height:        Intake/Output Summary (Last 24 hours) at 07/25/16 1022 Last data filed at 07/25/16 0900  Gross per 24 hour  Intake          1487.93 ml  Output             2800 ml  Net         -1312.07 ml   Filed Weights   07/23/16 0900 07/24/16 0800 07/25/16 0500  Weight: 97.7 kg (215 lb 6.2 oz) 97.3 kg (214 lb 8.1 oz) 98.2 kg (216 lb 7.9 oz)    Examination: Constitutional: NAD Vitals:   07/24/16 2300 07/25/16 0422 07/25/16 0500 07/25/16 0734  BP: 90/64 (!) 87/55  (!) 86/65  Pulse: 74 78  77  Resp: _1 Temp:  97.5 F (36.4 C)  97.7 F (36.5 C)  TempSrc:  Oral  Oral  SpO2: 99% 97%  98%  Weight:   98.2 kg (216 lb 7.9 oz)   Height:       Eyes: PERRL, lids and conjunctivae normal Respiratory: clear to auscultation bilaterally, no wheezing, no crackles. Cardiovascular: Regular rate and rhythm, no murmurs / rubs / gallops. 3+ pitting LE edema Abdomen: no tenderness. Bowel sounds decreased. Incision dressing c/d/i Musculoskeletal: no clubbing / cyanosis. Neurologic: non focal    Data Reviewed: I have personally reviewed following labs and imaging studies  CBC:  Recent Labs Lab 07/19/16 1157  07/21/16 0430 07/22/16 0500 07/23/16 0338 07/24/16 0256 07/25/16 0331  WBC 7.6  < > 9.5 13.9* 13.5* 12.7* 12.6*  NEUTROABS 7.1  --   --   --   --   --   --   HGB 8.7*  < > 7.0* 8.9* 7.9* 7.6* 7.3*  HCT 26.9*  < > 21.7* 27.3* 24.3* 23.3* 22.9*  MCV 92.8  < > 95.6 91.6 93.8 94.7 95.4  PLT 75*  < > 38* 47* 47* 52* 52*  < > =  values in this interval not displayed. Basic Metabolic Panel:  Recent Labs Lab 07/19/16 0245 07/20/16 0216 07/21/16 0430 07/22/16 0500 07/24/16 0256 07/25/16 0331  NA 140 140 141 143 137 140  K 4.0 3.9 3.8 4.0 4.0 3.9  CL 111 111 114* 110 108 108  CO2 24 22 21* _2 GLUCOSE 135* 145* 197* 115* 140* 109*  BUN 31* 27* 24* 23* 28* 25*  CREATININE 0.70 0.85 0.55* 0.57* 0.55* 0.62  CALCIUM 7.7* 7.3* 7.5* 8.1* 7.8* 8.0*  MG 2.0  --  1.8 2.1  --   --   PHOS  --   --  2.3* 3.5  --   --  GFR: Estimated Creatinine Clearance: 90.6 mL/min (by C-G formula based on SCr of 0.62 mg/dL). Liver Function Tests:  Recent Labs Lab 07/21/16 0430 07/24/16 0256 07/25/16 0331  AST 16 16 18  ALT 31 20 24  ALKPHOS 64 86 99  BILITOT 0.4 0.5 0.5  PROT 3.5* 3.9* 3.8*  ALBUMIN 1.4* 1.4* 1.4*   No results for input(s): LIPASE, AMYLASE in the last 168 hours. No results for input(s): AMMONIA in the last 168 hours. Coagulation Profile: No results for input(s): INR, PROTIME in the last 168 hours. Cardiac Enzymes: No results for input(s): CKTOTAL, CKMB, CKMBINDEX, TROPONINI in the last 168 hours. BNP (last 3 results) No results for input(s): PROBNP in the last 8760 hours. HbA1C: No results for input(s): HGBA1C in the last 72 hours. CBG:  Recent Labs Lab 07/24/16 1231 07/24/16 1634 07/24/16 1934 07/24/16 2259 07/25/16 0823  GLUCAP 137* 106* 97 106* 91   Lipid Profile: No results for input(s): CHOL, HDL, LDLCALC, TRIG, CHOLHDL, LDLDIRECT in the last 72 hours. Thyroid Function Tests: No results for input(s): TSH, T4TOTAL, FREET4, T3FREE, THYROIDAB in the last 72 hours. Anemia Panel: No results for input(s): VITAMINB12, FOLATE, FERRITIN, TIBC, IRON, RETICCTPCT in the last 72 hours. Urine analysis:    Component Value Date/Time   COLORURINE YELLOW 06/29/2016 2121   APPEARANCEUR CLEAR 06/29/2016 2121   LABSPEC 1.014 06/29/2016 2121   LABSPEC 1.005 05/09/2016 0937   PHURINE 7.0  06/29/2016 2121   GLUCOSEU NEGATIVE 06/29/2016 2121   GLUCOSEU Negative 05/09/2016 0937   HGBUR NEGATIVE 06/29/2016 2121   BILIRUBINUR NEGATIVE 06/29/2016 2121   BILIRUBINUR Negative 05/09/2016 0937   KETONESUR NEGATIVE 06/29/2016 2121   PROTEINUR NEGATIVE 06/29/2016 2121   UROBILINOGEN 0.2 05/09/2016 0937   NITRITE NEGATIVE 06/29/2016 2121   LEUKOCYTESUR NEGATIVE 06/29/2016 2121   LEUKOCYTESUR Negative 05/09/2016 0937   Radiology Studies: No results found. Scheduled Meds: . sodium chloride   Intravenous Once  . desmopressin  10 mcg Nasal QHS  . dexamethasone  4 mg Intravenous Q12H  . furosemide  40 mg Oral Daily  . insulin aspart  0-15 Units Subcutaneous TID AC & HS  . mouth rinse  15 mL Mouth Rinse BID  . midodrine  10 mg Oral TID WC  . multivitamin with minerals  1 tablet Oral Daily  . pantoprazole  40 mg Oral BID   Continuous Infusions: . sodium chloride 10 mL/hr at 07/24/16 2000  . argatroban 1.1 mcg/kg/min (07/25/16 0352)    Costin Gherghe, MD, PhD [facility] Pager [phone]  If 7PM-7AM, please contact night-coverage [email or web address] Password [email or web address] 07/25/2016, 10:22 AM     

## 2016-07-25 NOTE — Progress Notes (Signed)
ANTICOAGULATION CONSULT NOTE - Follow Up Consult  Pharmacy Consult for Argatroban Indication: Hx of VTE (DVT and PE) - new DVT on NOAC 12/21  Allergies  Allergen Reactions  . Tramadol Palpitations    "Made my chest feel tight"  . Amoxicillin Rash  . Penicillins Rash    Tolerates Rocephin Jan 2018.  TDD.  Has patient had a PCN reaction causing immediate rash, facial/tongue/throat swelling, SOB or lightheadedness with hypotension: unknown Has patient had a PCN reaction causing severe rash involving mucus membranes or skin necrosis: unknown Has patient had a PCN reaction that required hospitalization: no  Has patient had a PCN reaction occurring within the last 10 years: no  If all of the above answers are "NO", then may proceed with Cephalosporin use.    Patient Measurements: Height: '5\' 8"'$  (172.7 cm) Weight: 216 lb 7.9 oz (98.2 kg) IBW/kg (Calculated) : 68.4  Vital Signs: Temp: 97.7 F (36.5 C) (01/15 0734) Temp Source: Oral (01/15 0734) BP: 86/65 (01/15 0734) Pulse Rate: 77 (01/15 0734)  Labs:  Recent Labs  07/23/16 0338 07/24/16 0256 07/25/16 0331  HGB 7.9* 7.6* 7.3*  HCT 24.3* 23.3* 22.9*  PLT 47* 52* 52*  APTT 64* 60* 52*  CREATININE  --  0.55* 0.62    Estimated Creatinine Clearance: 90.6 mL/min (by C-G formula based on SCr of 0.62 mg/dL).   Assessment: 76 y/o M with metastatic NSCLC, on argatroban (per CCM preference) for DVT/PE, now with acute DVT s/p filter. Was on Xarelto PTA. SRA in the past is negative. Repeat HIT ab was negative on 1/5. Hgb low but stable at 7.3, plts low and stable at 52. Last aPTT is therapeutic at 0.52.  Goal of Therapy:  aPTT 50-90  seconds Monitor platelets by anticoagulation protocol: Yes   Plan:  Continue Argatroban 1.1 mcg/kg/min (6.58m/hr) Monitor Daily aPTT (goal 50-90 sec), CBC, s/s of bleed F/U transition to NOAC possibly today  NElenor Quinones PharmD, BTrinity Surgery Center LLCClinical Pharmacist Pager [phone]/15/2018 8:43  AM

## 2016-07-25 NOTE — Progress Notes (Signed)
[facility] Progress Note  6 Days Post-Op  Subjective: Denies abdominal pain, nausea, vomiting, or hematemesis. Had a BM yesterday. Unable to report whether or not he had blood in his stool. Tolerating soft diet. Working with therapies. Denies light headedness or dizziness.  Receiving a unit of blood today - hgb 7.3  Objective: Vital signs in last 24 hours: Temp:  [97.5 F (36.4 C)-98.5 F (36.9 C)] 97.7 F (36.5 C) (01/15 0734) Pulse Rate:  [69-92] 77 (01/15 0734) Resp:  [11-16] 12 (01/15 0734) BP: (84-103)/(55-74) 86/65 (01/15 0734) SpO2:  [97 %-99 %] 98 % (01/15 0734) Weight:  [98.2 kg (216 lb 7.9 oz)] 98.2 kg (216 lb 7.9 oz) (01/15 0500) Last BM Date: 07/24/16  Intake/Output from previous day: 01/14 0701 - 01/15 0700 In: 1253.1 [P.O.:720; I.V.:533.1] Out: 2150 [Urine:2150] Intake/Output this shift: Total I/O In: 265.2 [P.O.:240; I.V.:25.2] Out: 650 [Urine:650]  PE: Gen: Alert, NAD, pleasant Pulm: effort normal Abd: Soft, ND, nontender, +BS, midline incision C/D/I   Lab Results:   Recent Labs  07/24/16 0256 07/25/16 0331  WBC 12.7* 12.6*  HGB 7.6* 7.3*  HCT 23.3* 22.9*  PLT 52* 52*   BMET  Recent Labs  07/24/16 0256 07/25/16 0331  NA 137 140  K 4.0 3.9  CL 108 108  CO2 24 25  GLUCOSE 140* 109*  BUN 28* 25*  CREATININE 0.55* 0.62  CALCIUM 7.8* 8.0*   CMP     Component Value Date/Time   NA 140 07/25/2016 0331   NA 132 (L) 06/15/2016 0852   K 3.9 07/25/2016 0331   K 4.1 06/15/2016 0852   CL 108 07/25/2016 0331   CL 109 (H) 12/31/2012 0759   CO2 25 07/25/2016 0331   CO2 23 06/15/2016 0852   GLUCOSE 109 (H) 07/25/2016 0331   GLUCOSE 101 06/15/2016 0852   GLUCOSE 116 (H) 12/31/2012 0759   BUN 25 (H) 07/25/2016 0331   BUN 19.0 06/15/2016 0852   CREATININE 0.62 07/25/2016 0331   CREATININE 0.8 06/15/2016 0852   CALCIUM 8.0 (L) 07/25/2016 0331   CALCIUM 8.5 06/15/2016 0852   PROT 3.8 (L) 07/25/2016 0331   PROT 5.6 (L)  06/15/2016 0852   ALBUMIN 1.4 (L) 07/25/2016 0331   ALBUMIN 2.8 (L) 06/15/2016 0852   AST 18 07/25/2016 0331   AST 10 06/15/2016 0852   ALT 24 07/25/2016 0331   ALT 27 06/15/2016 0852   ALKPHOS 99 07/25/2016 0331   ALKPHOS 77 06/15/2016 0852   BILITOT 0.5 07/25/2016 0331   BILITOT 0.99 06/15/2016 0852   GFRNONAA >60 07/25/2016 0331   GFRAA >60 07/25/2016 0331   Lipase     Component Value Date/Time   LIPASE 21 06/24/2016 1622    Anti-infectives: Anti-infectives    Start     Dose/Rate Route Frequency Ordered Stop   07/19/16 0800  cefoTEtan (CEFOTAN) 1 g in dextrose 5 % 50 mL IVPB     1 g 100 mL/hr over 30 Minutes Intravenous To [facility] 07/18/16 1112 07/19/16 1518   07/14/16 2200  cefTRIAXone (ROCEPHIN) 2 g in dextrose 5 % 50 mL IVPB  Status:  Discontinued     2 g 100 mL/hr over 30 Minutes Intravenous Every 24 hours 07/14/16 0915 07/21/16 1309   07/13/16 2200  cefTRIAXone (ROCEPHIN) 1 g in dextrose 5 % 50 mL IVPB  Status:  Discontinued     1 g 100 mL/hr over 30 Minutes Intravenous Every 24 hours 07/13/16 2132 07/14/16 0915   07/13/16  2200  metroNIDAZOLE (FLAGYL) IVPB 500 mg  Status:  Discontinued     500 mg 100 mL/hr over 60 Minutes Intravenous Every 8 hours 07/13/16 2132 07/21/16 1309      Assessment/Plan Recurrent SBO S/p Lap assisted lysis of adhesions, small bowel resection, and laparoscopic resection of Meckel's diverticulum1/9 Dr. Barry Dienes - POD 6 - path pending - SOFT diet - WBC trending down, 12.7; afebrile  Acute hypoxic respiratory failure secondary to possible aspiration event / Aspiration pneumonitis versus pneumonia- supplemental O2, stable Thrombocytopenia, ABLA- Hg 7.3, stable. Received 1 uPRBC 1/11, receiving another today 1/15. Transfusions per IM NSCLC- being treated with Keytruda, Dr. [name] Multiple myeloma with skull lesion - in remission Hx PE on Eliquis, new DVT on NOAC s/p filter - Eliquis held, h/o HIT, on argatroban. Stage 2  sacral ulcer- per wound care  ID - ceftriaxone 1/3>>1/11, flagyl 1/3>>1/11 FEN - soft diet, wean TPN VTE - argatroban  Code status - DNR  Plan -  Continue soft diet Continue therapies/mobility. Patient hoping to go to CIR in next 1-2 days  Path pending   Ok to restart NOAC w/ Xarelto   No overt signs of upper or lower GI bleed. Would not be unexpected for pt to experience some blood in his stool s/p small bowel resection, esp while on blood thinning medications and in the setting of thrombocytopenia. Continue to monitor.                     LOS: 12 days    Jill Alexanders , [facility] 07/25/2016, 9:02 AM Pager: [phone] Consults: [phone] Mon-Fri 7:00 am-4:30 pm Sat-Sun 7:00 am-11:30 am

## 2016-07-25 NOTE — Progress Notes (Signed)
Rehab admissions - I am following for potential acute inpatient rehab admission once patient able to tolerate out of bed and activity better, once BP more stable, and then if we get approval from insurance carrier.  Call me for questions.  #[phone]

## 2016-07-25 NOTE — Progress Notes (Signed)
Rehab admissions - Patient did well with PT/OT sessions today.  I will open the case with [facility] medicare and request acute inpatient rehab admission.  I will update all after I hear back from insurance case manager.  Call me for questions.  #[phone]

## 2016-07-25 NOTE — Progress Notes (Addendum)
Physical Therapy Treatment Patient Details Name: [name] MRN: [id number] DOB: Feb 03, 1941 Today's Date: 07/25/2016    History of Present Illness Patient is a 76 y/o male with hx of recently diagnosed stage IV NSCLC (80% PDL1+) with brain mets, DVT and PEs s/p IVC filter,recent SBO, DM presents with recurrent SBO. Transferred to CIR on 1/3, but developed vomiting again and respiratory distress requiring nonrebreather and transferred back to acute care. NG tube placed due to SBO. s/p lap LOA 1/9.    PT Comments    Patient seen for activity progression. Seen in conjunction with OT therapist. Performed some general LE activity and positioned for BP acclamation in preparation for return visit later this am.  BP at this time 90s/60s. Ace wraps requested for follow up session with plan for OOB activity. Patient tolerated therapeutic exercise well and is eager to progress.   Follow Up Recommendations  CIR     Equipment Recommendations  None recommended by PT    Recommendations for Other Services OT consult     Precautions / Restrictions Precautions Precautions: Fall Precaution Comments: monitor BP, hypotensive Restrictions Weight Bearing Restrictions: No    Mobility  Bed Mobility Overal bed mobility: Needs Assistance Bed Mobility: Rolling Rolling: Min assist         General bed mobility comments: Assist for positioning, cues for alignment. Patient able to use UE to reach for rail in both directions  Transfers                 General transfer comment: mechanical features of bed used to elevate trunk for BP acclaimain prior to return visit  Ambulation/Gait                 Stairs            Wheelchair Mobility    Modified Rankin (Stroke Patients Only)       Balance                                    Cognition Arousal/Alertness: Awake/alert Behavior During Therapy: WFL for tasks assessed/performed Overall Cognitive Status:  Within Functional Limits for tasks assessed                      Exercises General Exercises - Lower Extremity Ankle Circles/Pumps: AROM;Both;10 reps Heel Slides: AROM;Both Other Exercises Other Exercises: bilateral Rolling activities performed in bed x2    General Comments General comments (skin integrity, edema, etc.): BP assessed 90s/60s      Pertinent Vitals/Pain Pain Assessment: Faces Faces Pain Scale: Hurts little more Pain Location: abdominal region, surgical site Pain Descriptors / Indicators: Guarding;Sore Pain Intervention(s): Limited activity within patient's tolerance    Home Living                      Prior Function            PT Goals (current goals can now be found in the care plan section) Acute Rehab PT Goals PT Goal Formulation: With patient Time For Goal Achievement: 08/01/16 Potential to Achieve Goals: Good Progress towards PT goals: Progressing toward goals    Frequency    Min 3X/week      PT Plan Current plan remains appropriate    Co-evaluation PT/OT/SLP Co-Evaluation/Treatment: Yes Reason for Co-Treatment: Complexity of the patient's impairments (multi-system involvement);For patient/therapist safety;To address functional/ADL transfers PT goals addressed during session:  Mobility/safety with mobility OT goals addressed during session: ADL's and self-care     End of Session Equipment Utilized During Treatment: Activity Tolerance: Patient tolerated treatment well Patient left: in bed;with call bell/phone within reach;with nursing/sitter in room;with family/visitor present     Time: 4709-6283 PT Time Calculation (min) (ACUTE ONLY): 23 min  Charges:  $Therapeutic Activity: 8-22 mins                    G Codes:      Duncan Dull 08-03-16, 11:11 AM Alben Deeds, PT DPT  [phone]

## 2016-07-25 NOTE — Progress Notes (Addendum)
Physical Therapy Treatment Patient Details Name: [name] MRN: [id number] DOB: 07-18-1940 Today's Date: 07/25/2016    History of Present Illness Patient is a 76 y/o male with hx of recently diagnosed stage IV NSCLC (80% PDL1+) with brain mets, DVT and PEs s/p IVC filter,recent SBO, DM presents with recurrent SBO. Transferred to CIR on 1/3, but developed vomiting again and respiratory distress requiring nonrebreather and transferred back to acute care. NG tube placed due to SBO. s/p lap LOA 1/9.    PT Comments    Patient seen for follow up session. Tolerated increased activity with therapy during second session. BP improved with LE compression wrapping uppers 90s over 70s. Patient able to perform extensive EOB activities and therapeutic exercises both EOB and in chair. Improved ability to perform bed mobilization but continues to require increased physical assist during transfers due to LE weakness (L>R) and overall deconditioning. At this time, feel patient would be ideal candidate for intensive therapies now that activity tolerance has improved. Continue to recommend CIR.  Follow Up Recommendations  CIR     Equipment Recommendations  None recommended by PT    Recommendations for Other Services OT consult     Precautions / Restrictions Precautions Precautions: Fall Precaution Comments: monitor BP, hypotensive Restrictions Weight Bearing Restrictions: No    Mobility  Bed Mobility Overal bed mobility: Needs Assistance Bed Mobility: Rolling Rolling: Min assist   Supine to sit: Min assist     General bed mobility comments: Cues for UE reaching for side rail, min assist to reposition and scoot to EOB. Cues for LE movement to EOB prior to elevation of trunk to upright. increased time and effort to perform  Transfers Overall transfer level: Needs assistance Equipment used: 2 person hand held assist Transfers: Sit to/from Stand Sit to Stand: Mod assist;+2 physical  assistance Stand pivot transfers: Max assist;+2 safety/equipment       General transfer comment: Moderate assist to power up from elevated surface, Max 2 person assist for pivotal transition to drop arm recliner. LLE knee blocked during transition with face to face support from therapists  Ambulation/Gait                 Stairs            Wheelchair Mobility    Modified Rankin (Stroke Patients Only)       Balance Overall balance assessment: Needs assistance Sitting-balance support: Feet supported;Bilateral upper extremity supported Sitting balance-Leahy Scale: Fair     Standing balance support: Bilateral upper extremity supported Standing balance-Leahy Scale: Poor Standing balance comment: requiring +2 physical assist                    Cognition Arousal/Alertness: Awake/alert Behavior During Therapy: WFL for tasks assessed/performed Overall Cognitive Status: Within Functional Limits for tasks assessed               Problem Solving: Slow processing;Requires verbal cues      Exercises General Exercises - Lower Extremity Ankle Circles/Pumps: AROM;Both;10 reps Heel Slides: AROM;Both Other Exercises Other Exercises: Bilateral LE compression wrapped with ace bandages Other Exercises: LAQs in chair  Other Exercises: Ankle pumps EOB and in chair Other Exercises: Functional task performance (self reclining with lever performed as activity x5 in chair Other Exercises: Bilateral LE leg raises in chair x5    General Comments General comments (skin integrity, edema, etc.): BP assessed 90s/60s      Pertinent Vitals/Pain Pain Assessment: Faces Faces Pain Scale: Hurts little  more Pain Location: abdominal region, surgical site Pain Descriptors / Indicators: Guarding;Sore Pain Intervention(s): Monitored during session    Home Living                      Prior Function            PT Goals (current goals can now be found in the care  plan section) Acute Rehab PT Goals Patient Stated Goal: to get better PT Goal Formulation: With patient Time For Goal Achievement: 08/01/16 Potential to Achieve Goals: Good Progress towards PT goals: Progressing toward goals    Frequency    Min 3X/week      PT Plan Current plan remains appropriate    Co-evaluation PT/OT/SLP Co-Evaluation/Treatment: Yes Reason for Co-Treatment: Complexity of the patient's impairments (multi-system involvement) PT goals addressed during session: Mobility/safety with mobility OT goals addressed during session: ADL's and self-care     End of Session Equipment Utilized During Treatment: Gait belt Activity Tolerance: Patient tolerated treatment well Patient left: in chair;with call bell/phone within reach;with family/visitor present     Time: 1131-1155 PT Time Calculation (min) (ACUTE ONLY): 24 min  Charges:  $Therapeutic Activity: 8-22 mins                    G Codes:      Duncan Dull 26-Jul-2016, 12:20 PM Alben Deeds, [place] DPT  [phone]

## 2016-07-25 NOTE — Progress Notes (Signed)
Occupational Therapy Treatment Patient Details Name: LEODAN BOLYARD MRN: [id number] DOB: 12-11-1940 Today's Date: 07/25/2016    History of present illness Patient is a 76 y/o male with hx of recently diagnosed stage IV NSCLC (80% PDL1+) with brain mets, DVT and PEs s/p IVC filter,recent SBO, DM presents with recurrent SBO. Transferred to CIR on 1/3, but developed vomiting again and respiratory distress requiring nonrebreather and transferred back to acute care. NG tube placed due to SBO. s/p lap LOA 1/9.   OT comments  Pt seen x 2 today with PT. Initial session at bed level with preparing pt for OOB and awaiting ace wraps for BLE to assist with BP. Supine BP 86/65. Pt completed BUE general strengthening. Second session focused on transferring to chair. BP EOB 86/65. BP in chair 92/77. Encouraged BUE and LE exercise when in chair. Wife present for session and very appreciative.  Pt fatigues easily but is motivated to get better. Excellent participation with therapy. Will continue to follow acutely. Continue to recommend CIR for rehab.   Follow Up Recommendations  CIR    Equipment Recommendations  3 in 1 bedside commode    Recommendations for Other Services      Precautions / Restrictions Precautions Precautions: Fall Precaution Comments: monitor BP, hypotensive Restrictions Weight Bearing Restrictions: No       Mobility Bed Mobility Overal bed mobility: Needs Assistance Bed Mobility: Supine to Sit Rolling: Min assist   Supine to sit: Min assist     General bed mobility comments: HOB elevated  Transfers Overall transfer level: Needs assistance Equipment used: 2 person hand held assist Transfers: Sit to/from Stand Sit to Stand: Mod assist;+2 physical assistance Stand pivot transfers: Max assist;+2 safety/equipment       General transfer comment: Moderate assist to power up from elevated surface, Max 2 person assist for pivotal transition to drop arm recliner. LLE knee  blocked during transition with face to face support from therapists    Balance Overall balance assessment: Needs assistance Sitting-balance support: Feet supported;Bilateral upper extremity supported Sitting balance-Leahy Scale: Fair     Standing balance support: Bilateral upper extremity supported Standing balance-Leahy Scale: Poor Standing balance comment: requiring +2 physical assist                   ADL Overall ADL's : Needs assistance/impaired Eating/Feeding: Supervision/ safety;Set up   Grooming: Supervision/safety;Sitting;Wash/dry hands;Wash/dry face   Upper Body Bathing: Sitting;Supervision/ safety;Set up   Lower Body Bathing: Moderate assistance;Sitting/lateral leans           Toilet Transfer: Maximal assistance;+2 for physical assistance;+2 for safety/equipment (simulated stand pivot)     Toileting - Clothing Manipulation Details (indicate cue type and reason): foley     Functional mobility during ADLs: Moderate assistance;+2 for safety/equipment;+2 for physical assistance General ADL Comments: Increased tolerance as compared to last session. Encouraged pt to complete self care tasks as he is able to improve his endurance                                      Cognition   Behavior During Therapy: [facility] for tasks assessed/performed Overall Cognitive Status: Within Functional Limits for tasks assessed                Problem Solving: Slow processing;Requires verbal cues      Extremity/Trunk Assessment   BUE generalized weakness.  Exercises General Exercises - Upper Extremity Shoulder Flexion: AROM;Strengthening;Both;15 reps;Supine Shoulder ABduction: AROM;Strengthening;Both;15 reps;Seated Elbow Flexion: AROM;Strengthening;Both;15 reps;Seated Chair Push Up: Strengthening;Both;5 reps General Exercises - Lower Extremity Ankle Circles/Pumps: AROM;Both;10 reps Heel Slides: AROM;Both Other Exercises Other Exercises:  Bilateral LE compression wrapped with ace bandages Other Exercises: LAQs in chair  Other Exercises: Ankle pumps EOB and in chair Other Exercises: Functional task performance (self reclining with lever performed as activity x5 in chair Other Exercises: Bilateral LE leg raises in chair x5  Core exercises as tolerated with pt pulling to upright sitting in chair.   Shoulder Instructions       General Comments  skin breakdown on sacral area    Pertinent Vitals/ Pain       Pain Assessment: Faces Faces Pain Scale: Hurts little more Pain Location: abdominal region, surgical site Pain Descriptors / Indicators: Guarding;Sore Pain Intervention(s): Limited activity within patient's tolerance  Home Living                                          Prior Functioning/Environment              Frequency  Min 2X/week        Progress Toward Goals  OT Goals(current goals can now be found in the care plan section)  Progress towards OT goals: Progressing toward goals  Acute Rehab OT Goals Patient Stated Goal: to get better OT Goal Formulation: With patient Time For Goal Achievement: 08/01/16 Potential to Achieve Goals: Good ADL Goals Pt Will Perform Grooming: with min guard assist;standing Pt Will Perform Lower Body Bathing: with min assist;sit to/from stand Pt Will Perform Lower Body Dressing: with min assist;sit to/from stand Pt Will Transfer to Toilet: with min assist;ambulating;bedside commode Pt Will Perform Toileting - Clothing Manipulation and hygiene: with min assist;sit to/from stand Pt/caregiver will Perform Home Exercise Program: Increased strength;Both right and left upper extremity;With theraband;Independently Additional ADL Goal #1: Pt will utilize energy conservation strategies in ADL independently. Additional ADL Goal #2: pt will perform UE strengthening program (AROM and level one theraband) with supervision and written HEP  Plan Discharge plan  remains appropriate    Co-evaluation    PT/OT/SLP Co-Evaluation/Treatment: Yes Reason for Co-Treatment: Complexity of the patient's impairments (multi-system involvement);For patient/therapist safety;To address functional/ADL transfers PT goals addressed during session: Mobility/safety with mobility OT goals addressed during session: ADL's and self-care;Strengthening/ROM      End of Session Equipment Utilized During Treatment: Gait belt   Activity Tolerance Patient tolerated treatment well   Patient Left in chair;with call bell/phone within reach;with family/visitor present   Nurse Communication Mobility status;Need for lift equipment (use Bariatric Stedy)        Time: 2458-0998 OT Time Calculation (min): 29 min  2nd visit Time: 1126-1156 30 min   Charges: OT General Charges $OT Visit: 1 Procedure OT Evaluation $OT Eval Low Complexity:  (2 visits) OT Treatments $Self Care/Home Management : 23-37 mins  Benedicta Sultan,HILLARY 07/25/2016, 12:35 PM

## 2016-07-25 NOTE — Clinical Social Work Note (Signed)
CSW continue to follow for discharge needs.  [name], Jay

## 2016-07-26 ENCOUNTER — Telehealth: Payer: Self-pay | Admitting: Medical Oncology

## 2016-07-26 DIAGNOSIS — I825Z9 Chronic embolism and thrombosis of unspecified deep veins of unspecified distal lower extremity: Secondary | ICD-10-CM

## 2016-07-26 LAB — CBC
HEMATOCRIT: 24.8 % — AB (ref 39.0–52.0)
Hemoglobin: 8 g/dL — ABNORMAL LOW (ref 13.0–17.0)
MCH: 30.4 pg (ref 26.0–34.0)
MCHC: 32.3 g/dL (ref 30.0–36.0)
MCV: 94.3 fL (ref 78.0–100.0)
Platelets: 48 10*3/uL — ABNORMAL LOW (ref 150–400)
RBC: 2.63 MIL/uL — AB (ref 4.22–5.81)
RDW: 21 % — ABNORMAL HIGH (ref 11.5–15.5)
WBC: 10.8 10*3/uL — AB (ref 4.0–10.5)

## 2016-07-26 LAB — GLUCOSE, CAPILLARY
GLUCOSE-CAPILLARY: 123 mg/dL — AB (ref 65–99)
GLUCOSE-CAPILLARY: 172 mg/dL — AB (ref 65–99)
Glucose-Capillary: 126 mg/dL — ABNORMAL HIGH (ref 65–99)
Glucose-Capillary: 120 mg/dL — ABNORMAL HIGH (ref 65–99)

## 2016-07-26 LAB — APTT: aPTT: 55 seconds — ABNORMAL HIGH (ref 24–36)

## 2016-07-26 MED ORDER — FUROSEMIDE 40 MG PO TABS
20.0000 mg | ORAL_TABLET | Freq: Every day | ORAL | Status: DC
  Administered 2016-07-26 – 2016-07-28 (×3): 20 mg via ORAL
  Filled 2016-07-26 (×3): qty 1

## 2016-07-26 MED ORDER — MIDODRINE HCL 5 MG PO TABS
15.0000 mg | ORAL_TABLET | Freq: Three times a day (TID) | ORAL | Status: DC
  Administered 2016-07-26 – 2016-07-29 (×10): 15 mg via ORAL
  Filled 2016-07-26 (×10): qty 3

## 2016-07-26 MED ORDER — ALBUMIN HUMAN 25 % IV SOLN
25.0000 g | Freq: Once | INTRAVENOUS | Status: AC
  Administered 2016-07-26: 25 g via INTRAVENOUS
  Filled 2016-07-26: qty 50

## 2016-07-26 MED ORDER — RIVAROXABAN 20 MG PO TABS
20.0000 mg | ORAL_TABLET | Freq: Every day | ORAL | Status: DC
  Administered 2016-07-26: 20 mg via ORAL
  Filled 2016-07-26: qty 1

## 2016-07-26 NOTE — Progress Notes (Signed)
ANTICOAGULATION CONSULT NOTE - Follow Up Consult  Pharmacy Consult for rivaroxaban Indication: Hx of VTE (DVT and PE) - new DVT on NOAC 12/21  Allergies  Allergen Reactions  . Tramadol Palpitations    "Made my chest feel tight"  . Amoxicillin Rash  . Penicillins Rash    Tolerates Rocephin Jan 2018.  TDD.  Has patient had a PCN reaction causing immediate rash, facial/tongue/throat swelling, SOB or lightheadedness with hypotension: unknown Has patient had a PCN reaction causing severe rash involving mucus membranes or skin necrosis: unknown Has patient had a PCN reaction that required hospitalization: no  Has patient had a PCN reaction occurring within the last 10 years: no  If all of the above answers are "NO", then may proceed with Cephalosporin use.    Patient Measurements: Height: '5\' 8"'$  (172.7 cm) Weight: 205 lb (93 kg) IBW/kg (Calculated) : 68.4  Vital Signs: Temp: 97.5 F (36.4 C) (01/16 0813) Temp Source: Oral (01/16 0813) BP: 78/49 (01/16 0813) Pulse Rate: 89 (01/16 0813)  Labs:  Recent Labs  07/24/16 0256 07/25/16 0331 07/26/16 0445  HGB 7.6* 7.3* 8.0*  HCT 23.3* 22.9* 24.8*  PLT 52* 52* 48*  APTT 60* 52* 55*  CREATININE 0.55* 0.62  --     Estimated Creatinine Clearance: 88.2 mL/min (by C-G formula based on SCr of 0.62 mg/dL).   Assessment: 76 y/o M with metastatic NSCLC, on argatroban (per CCM preference) for DVT/PE, now with acute DVT s/p filter. Was on Xarelto PTA. SRA in the past is negative. Repeat HIT ab was negative on 1/5. Hgb low but stable at 7.3, plts low and stable at 52. Last aPTT is therapeutic at 0.52.  Plan:  DC argatroban Initiate xarelto in 1 hr 20 mg daily  Levester Fresh, PharmD, BCPS, BCCCP Clinical Pharmacist Clinical phone for 07/26/2016 from 7a-3:30p: [phone] If after 3:30p, please call main pharmacy at: [phone] 07/26/2016 9:09 AM

## 2016-07-26 NOTE — Progress Notes (Signed)
Rehab admissions - I have received a denial for acute inpatient rehab admission.  I will speak with family later today about denial.  [phone]

## 2016-07-26 NOTE — Telephone Encounter (Signed)
Pt updated me with his recent bx and plan for rehab. I confrimed appt on jan 30 with mohamed.

## 2016-07-26 NOTE — NC FL2 (Signed)
[place] LEVEL OF CARE SCREENING TOOL     IDENTIFICATION  Patient Name: [name] Birthdate: 25-Sep-1940 Sex: male Admission Date (Current Location): 07/13/2016  [facility] and [place] Number:  Herbalist and Address:  [facility] [place], [place] [address], [place], [place] [address]      Provider Number: [id number]  Attending Physician Name and Address:  Caren Griffins, MD  Relative Name and Phone Number:       Current Level of Care: Hospital Recommended Level of Care: [place] Prior Approval Number:    Date Approved/Denied:   PASRR Number: [id number]  Discharge Plan: SNF    Current Diagnoses: Patient Active Problem List   Diagnosis Date Noted  . Goals of care, counseling/discussion   . Palliative care by specialist   . Pressure injury of skin 07/14/2016  . Aspiration into airway 07/14/2016  . Debility 07/13/2016  . Acute hypoxemic respiratory failure ([place]) 07/13/2016  . Poor tolerance for activity   . Acute deep vein thrombosis (DVT) of distal vein of both lower extremities (HCC)   . Small bowel obstruction   . Hypotension   . Acute blood loss anemia   . Anemia of chronic disease   . Hyperglycemia   . Hypoalbuminemia due to protein-calorie malnutrition ([place])   . Primary malignant neoplasm of lung metastatic to other site [facility])   . DVT (deep vein thrombosis) in pregnancy ([place])   . Generalized abdominal pain   . Sepsis associated hypotension ([place])   . Benign prostatic hyperplasia without lower urinary tract symptoms   . GERD (gastroesophageal reflux disease) 06/24/2016  . Chronic diastolic CHF (congestive heart failure) ([place]) 06/24/2016  . GIB (gastrointestinal bleeding) 06/24/2016  . Malnutrition of moderate degree 06/20/2016  . Malignant neoplasm of upper lobe of right lung ([place])   . Pulmonary emboli ([place]) 06/19/2016  . Pulmonary embolus ([place]) 06/19/2016  . Abnormal CT scan, small bowel   .  Encephalopathy, metabolic   . Thrombocytopenia ([place])   . Diabetes insipidus ([place])   . Hypernatremia   . SBO (small bowel obstruction) 06/06/2016  . Hypokalemia 06/06/2016  . Adenocarcinoma of right lung, stage 4 ([place]) 05/27/2016  . Nausea with vomiting 05/18/2016  . Dehydration 05/18/2016  . Brain metastases ([place]) 05/04/2016  . Mediastinal adenopathy 05/04/2016  . Peripheral neuropathy due to chemotherapy ([place]) 02/08/2016  . Long term current use of anticoagulant therapy 11/04/2014  . Peripheral edema 11/04/2014  . Knee pain, bilateral 11/04/2014  . Headache 07/14/2014  . Abdominal aortic atherosclerosis ([place]) 04/28/2014  . History of pulmonary embolism 02/07/2014  . Acute deep vein thrombosis (DVT) of tibial vein of right lower extremity ([place]) 02/07/2014  . DVT of leg (deep venous thrombosis) ([place]) 02/06/2014  . Multiple myeloma ([place]) 01/27/2012  . Hyponatremia 01/16/2012  . Anemia 01/16/2012  . Dyspnea 04/18/2011  . Preop cardiovascular exam 04/18/2011  . ABDOMINAL PAIN-RUQ 04/29/2010  . IRRITABLE BOWEL SYNDROME 02/19/2009  . GERD 01/03/2008  . PERSONAL HX COLONIC POLYPS 01/03/2008    Orientation RESPIRATION BLADDER Height & Weight     Self, Time, Situation, Place  O2 (Nasal Canula 2 L) Incontinent, Indwelling catheter Weight: 205 lb (93 kg) Height:  '5\' 8"'$  (172.7 cm)  BEHAVIORAL SYMPTOMS/MOOD NEUROLOGICAL BOWEL NUTRITION STATUS   (None)  (None) Continent Diet (Soft)  AMBULATORY STATUS COMMUNICATION OF NEEDS Skin   Extensive Assist Verbally Skin abrasions, Bruising, Other (Comment) ([place], Skin tear, Weeping. Pressure injury stage 2: mid lower sacrum (  foam every 3 days), Open/dehisced wound/incision: Non pressure wound right foot (foam every 3 days))                       Personal Care Assistance Level of Assistance  Bathing, Feeding, Dressing Bathing Assistance: Limited assistance Feeding assistance: Limited assistance Dressing Assistance: Limited assistance      Functional Limitations Info  Sight, Hearing, Speech Sight Info: Adequate Hearing Info: Adequate Speech Info: Adequate    SPECIAL CARE FACTORS FREQUENCY  PT (By licensed PT), Blood pressure, Diabetic urine testing, OT (By licensed OT)     PT Frequency: 5 x week OT Frequency: 5 x week            Contractures Contractures Info: Not present    Additional Factors Info  Allergies, Code Status Code Status Info: DNR Allergies Info: Tramadol, Amoxicillin, Penicillins           Current Medications (07/26/2016):  This is the current hospital active medication list Current Facility-Administered Medications  Medication Dose Route Frequency Provider Last Rate Last Dose  . 0.9 %  sodium chloride infusion   Intravenous Continuous Brand Males, MD 10 mL/hr at 07/24/16 2000    . desmopressin (DDAVP NASAL) 0.01 % solution 10 mcg  10 mcg Nasal QHS Corey Harold, NP   10 mcg at 07/25/16 2200  . dexamethasone (DECADRON) injection 4 mg  4 mg Intravenous Q12H Corey Harold, NP   4 mg at 07/26/16 0859  . fentaNYL (SUBLIMAZE) injection 12.5 mcg  12.5 mcg Intravenous Q4H PRN Milagros Loll, MD   50 mcg at 07/19/16 1712  . furosemide (LASIX) tablet 20 mg  20 mg Oral Daily Costin Karlyne Greenspan, MD   20 mg at 07/26/16 0857  . insulin aspart (novoLOG) injection 0-15 Units  0-15 Units Subcutaneous TID AC & HS Costin Karlyne Greenspan, MD   2 Units at 07/26/16 1125  . LORazepam (ATIVAN) injection 0.5 mg  0.5 mg Intravenous Q6H PRN Milagros Loll, MD   0.5 mg at 07/15/16 0050  . MEDLINE mouth rinse  15 mL Mouth Rinse BID Raylene Miyamoto, MD   15 mL at 07/26/16 1000  . midodrine (PROAMATINE) tablet 15 mg  15 mg Oral TID WC Costin Karlyne Greenspan, MD   15 mg at 07/26/16 1121  . multivitamin with minerals tablet 1 tablet  1 tablet Oral Daily Costin Karlyne Greenspan, MD   1 tablet at 07/26/16 0857  . ondansetron (ZOFRAN) injection 4 mg  4 mg Intravenous Q6H PRN Milagros Loll, MD   4 mg at 07/19/16 1638  . oxyCODONE  (Oxy IR/ROXICODONE) immediate release tablet 5-10 mg  5-10 mg Oral Q4H PRN Caren Griffins, MD   5 mg at 07/26/16 1023  . pantoprazole (PROTONIX) EC tablet 40 mg  40 mg Oral BID Caren Griffins, MD   40 mg at 07/26/16 0857  . phenol (CHLORASEPTIC) mouth spray 1 spray  1 spray Mouth/Throat PRN Belkys A Regalado, MD   1 spray at 07/17/16 1007  . prochlorperazine (COMPAZINE) injection 5 mg  5 mg Intravenous Q6H PRN Milagros Loll, MD   5 mg at 07/19/16 [phone]  . rivaroxaban (XARELTO) tablet 20 mg  20 mg Oral Q lunch Wynell Balloon, RPH   20 mg at 07/26/16 1121  . sodium chloride (OCEAN) 0.65 % nasal spray 1 spray  1 spray Each Nare PRN Jeryl Columbia, NP   1 spray at 07/18/16 0552  .  sodium chloride flush (NS) 0.9 % injection 10-40 mL  10-40 mL Intracatheter PRN Belkys A Regalado, MD   10 mL at 07/23/16 2133     Discharge Medications: Please see discharge summary for a list of discharge medications.  Relevant Imaging Results:  Relevant Lab Results:   Additional Information SS#: [id number]  Candie Chroman, LCSW

## 2016-07-26 NOTE — Progress Notes (Signed)
PROGRESS NOTE  [name] DPO:[id number] DOB: 08/26/1940 DOA: 07/13/2016 PCP: [name], MD   LOS: 13 days   Brief Narrative: 76 year old man with history significant for MM and stage IV NSCLC now with mets to brain discovered 04/2016 undergoing radiotherapy to brain lesions and Ketruda, pulmonary embolism on Xarelto. He has been admitted several times in the past few months, initially for SBO back in October for SBO, which resolved with conservative therapy. He was deemed a poor surgical candidate, but it was felt that if he could regain some strength he could undergo surgery. He was admitted again 12/15 with projectile vomiting and SOB. He was then found to have GNR bacteremia. Course became complicated by shock and DVT while he was off DOAC for colitis related GIB. IVC filter placed. He was started on TPN 1/2 and transferred to CIR 1/3. Later that day he developed vomiting again where and was suspected to have aspirated developing hypoxia and significant respiratory distress. Patient admitted with Recurrent SBO and acute hypoxic respiratory failure secondary to aspiration PNA, now s/p laparoscopic LOA 1/9. Patient recovered well postoperatively, he is an ileus which resolved, his NG tube was discontinued and his diet was slowly advanced. He is now tolerating a soft diet. Postop, he has been having intermittent anemia without any obvious cause of GI bleeding. He required 2 units of packed red blood cells in the last week, most recent one on 1/15.   Assessment & Plan: Active Problems:   Multiple myeloma (HCC)   DVT of leg (deep venous thrombosis) (HCC)   History of pulmonary embolism   Peripheral edema   SBO (small bowel obstruction)   Diabetes insipidus (HCC)   Thrombocytopenia (HCC)   Hypoalbuminemia due to protein-calorie malnutrition (HCC)   Primary malignant neoplasm of lung metastatic to other site [facility])   Pressure injury of skin   Aspiration into airway   Goals of care,  counseling/discussion   Palliative care by specialist   Recurrent SBO - Gen. surgery was consulted, patient was taken to the operating room on 1/9 and he is now status post laparoscopic lysis of adhesions. Continue parenteral nutrition, appreciate surgical consultation, wean off if he has adequate by mouth intake - now having BMs, advancing diet per surgery, Tolerating soft  Thrombocytopenia, acute blood loss anemia. - Patient with history of hit, was maintained on argatroban. He has been having anemia requiring 2 units of packed red blood cells in the last 7 days, without any obvious evidence for bleed. Discussed with general surgery, is not entirely unexpected following small bowel resection. He was maintained on Argatroban for his history of DVT and PE, we'll transition to Xarelto today closely monitor CBC, if stable will be able to allow him to transfer to inpatient rehabilitation.  Anasarca/fluid overload - In the setting of malnutrition and IV fluids, continue Lasix, continue midodrine to support blood pressure - transitioned to po Lasix - Continue Lasix and medial drain on discharge, will likely be a slow process which should improve with improvement in his nutritional status   Acute hypoxic Respiratory failure, secondary to possible aspiration event / Aspiration pneumonitis versus pneumonia - was on IV ceftriaxone and flagyl, discontinued 1/11 as he had completed 8 days> Afebrile and stable since.  - Resolved, on room air  NSCLC (80% PDL1+)-->treating w/ Keytruda > - Dr. [name] is primary oncologist H/o Multiple Myeloma w/ skull lesion s/p XRT 2016 in remission.   H/o PE and DVT on Eliquis-->new DVTs on NOAC 12/21 >  S/P temp filter 12/1 - H/O HIT, was maintained on Argatroban perioperatively, his Xarelto is resumed on 1/16.  Chronic diastolic HF / hypotension  - Monitor volume status, he appears to have significant lower extremity edema and generalized anasarca, a component of  hypoalbuminemia due to malnutrition is present as well.  - Midodrine was added to support blood pressure, he is now on 15 mg 3 times daily  Transaminases - trending down, Now normalized  Hypoalbuminemia  - Pre-albumin 18  AKI - resolved  Hyponatremia - resolved.   Addison diseases - decadron, Desmopressin.  - hypotensive, added midodrine, continue. Asymptomatic despite low blood pressures  Stage 2 sacral ulcer noted 2x2 cms - Wound care consult   DVT prophylaxis: Xarelto  Code Status: DNR Family Communication: Wife [name] Disposition Plan: CIR if he gets approved 1-2 days; and is Hb is stable  Consultants:   General surgery   Oncology   Procedures:   Laparoscopic LOA 1/9  Antimicrobials:  Ceftriaxone 1/3 >> 1/11  Flagyl 1/3 >> 1/11  Subjective: - feels good, + BMs. No chest pain, shortness of breath, lightheadedness or dizziness   Objective: Vitals:   07/26/16 0321 07/26/16 0349 07/26/16 0813 07/26/16 1000  BP:  (!) 88/58 (!) 78/49 (!) 89/62  Pulse:  79 89 93  Resp:  '15 14 14  '$ Temp:  97.7 F (36.5 C) 97.5 F (36.4 C)   TempSrc:  Oral Oral   SpO2:  96% 97% 98%  Weight: 93 kg (205 lb)     Height:        Intake/Output Summary (Last 24 hours) at 07/26/16 1046 Last data filed at 07/26/16 0600  Gross per 24 hour  Intake          1500.32 ml  Output             4850 ml  Net         -3349.68 ml   Filed Weights   07/24/16 0800 07/25/16 0500 07/26/16 0321  Weight: 97.3 kg (214 lb 8.1 oz) 98.2 kg (216 lb 7.9 oz) 93 kg (205 lb)    Examination: Constitutional: NAD Vitals:   07/26/16 0321 07/26/16 0349 07/26/16 0813 07/26/16 1000  BP:  (!) 88/58 (!) 78/49 (!) 89/62  Pulse:  79 89 93  Resp:  '15 14 14  '$ Temp:  97.7 F (36.5 C) 97.5 F (36.4 C)   TempSrc:  Oral Oral   SpO2:  96% 97% 98%  Weight: 93 kg (205 lb)     Height:       Eyes: PERRL, lids and conjunctivae normal Respiratory: clear to auscultation bilaterally, no wheezing, no  crackles. Cardiovascular: Regular rate and rhythm, no murmurs / rubs / gallops. 3+ pitting LE edema Abdomen: no tenderness. Bowel sounds decreased. Incision dressing c/d/i Musculoskeletal: no clubbing / cyanosis. Neurologic: non focal    Data Reviewed: I have personally reviewed following labs and imaging studies  CBC:  Recent Labs Lab 07/19/16 1157  07/22/16 0500 07/23/16 0338 07/24/16 0256 07/25/16 0331 07/26/16 0445  WBC 7.6  < > 13.9* 13.5* 12.7* 12.6* 10.8*  NEUTROABS 7.1  --   --   --   --   --   --   HGB 8.7*  < > 8.9* 7.9* 7.6* 7.3* 8.0*  HCT 26.9*  < > 27.3* 24.3* 23.3* 22.9* 24.8*  MCV 92.8  < > 91.6 93.8 94.7 95.4 94.3  PLT 75*  < > 47* 47* 52* 52* 48*  < > =  values in this interval not displayed. Basic Metabolic Panel:  Recent Labs Lab 07/20/16 0216 07/21/16 0430 07/22/16 0500 07/24/16 0256 07/25/16 0331  NA 140 141 143 137 140  K 3.9 3.8 4.0 4.0 3.9  CL 111 114* 110 108 108  CO2 22 21* '25 24 25  '$ GLUCOSE 145* 197* 115* 140* 109*  BUN 27* 24* 23* 28* 25*  CREATININE 0.85 0.55* 0.57* 0.55* 0.62  CALCIUM 7.3* 7.5* 8.1* 7.8* 8.0*  MG  --  1.8 2.1  --   --   PHOS  --  2.3* 3.5  --   --    GFR: Estimated Creatinine Clearance: 88.2 mL/min (by C-G formula based on SCr of 0.62 mg/dL). Liver Function Tests:  Recent Labs Lab 07/21/16 0430 07/24/16 0256 07/25/16 0331  AST '16 16 18  '$ ALT '31 20 24  '$ ALKPHOS 64 86 99  BILITOT 0.4 0.5 0.5  PROT 3.5* 3.9* 3.8*  ALBUMIN 1.4* 1.4* 1.4*   No results for input(s): LIPASE, AMYLASE in the last 168 hours. No results for input(s): AMMONIA in the last 168 hours. Coagulation Profile: No results for input(s): INR, PROTIME in the last 168 hours. Cardiac Enzymes: No results for input(s): CKTOTAL, CKMB, CKMBINDEX, TROPONINI in the last 168 hours. BNP (last 3 results) No results for input(s): PROBNP in the last 8760 hours. HbA1C: No results for input(s): HGBA1C in the last 72 hours. CBG:  Recent Labs Lab  07/25/16 1205 07/25/16 1526 07/25/16 2007 07/25/16 2134 07/26/16 0810  GLUCAP 179* 116* 128* 137* 126*   Lipid Profile: No results for input(s): CHOL, HDL, LDLCALC, TRIG, CHOLHDL, LDLDIRECT in the last 72 hours. Thyroid Function Tests: No results for input(s): TSH, T4TOTAL, FREET4, T3FREE, THYROIDAB in the last 72 hours. Anemia Panel: No results for input(s): VITAMINB12, FOLATE, FERRITIN, TIBC, IRON, RETICCTPCT in the last 72 hours. Urine analysis:    Component Value Date/Time   COLORURINE YELLOW 06/29/2016 2121   APPEARANCEUR CLEAR 06/29/2016 2121   LABSPEC 1.014 06/29/2016 2121   LABSPEC 1.005 05/09/2016 0937   PHURINE 7.0 06/29/2016 2121   GLUCOSEU NEGATIVE 06/29/2016 2121   GLUCOSEU Negative 05/09/2016 0937   HGBUR NEGATIVE 06/29/2016 2121   BILIRUBINUR NEGATIVE 06/29/2016 2121   [place] Negative 05/09/2016 [place] 06/29/2016 2121   PROTEINUR NEGATIVE 06/29/2016 2121   UROBILINOGEN 0.2 05/09/2016 0937   NITRITE NEGATIVE 06/29/2016 2121   LEUKOCYTESUR NEGATIVE 06/29/2016 2121   LEUKOCYTESUR Negative 05/09/2016 3875   Radiology Studies: No results found. Scheduled Meds: . desmopressin  10 mcg Nasal QHS  . dexamethasone  4 mg Intravenous Q12H  . furosemide  20 mg Oral Daily  . insulin aspart  0-15 Units Subcutaneous TID AC & HS  . mouth rinse  15 mL Mouth Rinse BID  . midodrine  15 mg Oral TID WC  . multivitamin with minerals  1 tablet Oral Daily  . pantoprazole  40 mg Oral BID  . rivaroxaban  20 mg Oral Q lunch   Continuous Infusions: . sodium chloride 10 mL/hr at 07/24/16 2000    Marzetta Board, MD, PhD [facility] Pager [phone]  If 7PM-7AM, please contact night-coverage [email or web address] Password [facility] 07/26/2016, 10:46 AM

## 2016-07-26 NOTE — Progress Notes (Signed)
[facility] Progress Note  7 Days Post-Op  Subjective: NAE. Denies abdominal pain. +flatus and reports a BM today at 0600 that was loose and non-bloody. Worked with PT on edge of bed yesterday. Sat up in the chair in his room for a few hours.   Objective: Vital signs in last 24 hours: Temp:  [97.5 F (36.4 C)-98.2 F (36.8 C)] 97.5 F (36.4 C) (01/16 0813) Pulse Rate:  [68-94] 89 (01/16 0813) Resp:  [8-25] 14 (01/16 0813) BP: (74-102)/(49-79) 78/49 (01/16 0813) SpO2:  [95 %-100 %] 97 % (01/16 0813) Weight:  [93 kg (205 lb)] 93 kg (205 lb) (01/16 0321) Last BM Date: 07/25/16  Intake/Output from previous day: 01/15 0701 - 01/16 0700 In: 2148.6 [P.O.:1200; I.V.:613.6; Blood:335] Out: 3295 [Urine:5500] Intake/Output this shift: No intake/output data recorded.  PE: Gen: Alert, NAD, pleasant CV: RRR Pulm: effort normal Abd: Soft, ND, nontender, +BS, midline incision C/D/I  Ext: 2+ pitting edema in BLLE  Lab Results:   Recent Labs  07/25/16 0331 07/26/16 0445  WBC 12.6* 10.8*  HGB 7.3* 8.0*  HCT 22.9* 24.8*  PLT 52* 48*   BMET  Recent Labs  07/24/16 0256 07/25/16 0331  NA 137 140  K 4.0 3.9  CL 108 108  CO2 24 25  GLUCOSE 140* 109*  BUN 28* 25*  CREATININE 0.55* 0.62  CALCIUM 7.8* 8.0*   PT/INR No results for input(s): LABPROT, INR in the last 72 hours. CMP     Component Value Date/Time   NA 140 07/25/2016 0331   NA 132 (L) 06/15/2016 0852   K 3.9 07/25/2016 0331   K 4.1 06/15/2016 0852   CL 108 07/25/2016 0331   CL 109 (H) 12/31/2012 0759   CO2 25 07/25/2016 0331   CO2 23 06/15/2016 0852   GLUCOSE 109 (H) 07/25/2016 0331   GLUCOSE 101 06/15/2016 0852   GLUCOSE 116 (H) 12/31/2012 0759   BUN 25 (H) 07/25/2016 0331   BUN 19.0 06/15/2016 0852   CREATININE 0.62 07/25/2016 0331   CREATININE 0.8 06/15/2016 0852   CALCIUM 8.0 (L) 07/25/2016 0331   CALCIUM 8.5 06/15/2016 0852   PROT 3.8 (L) 07/25/2016 0331   PROT 5.6 (L) 06/15/2016  0852   ALBUMIN 1.4 (L) 07/25/2016 0331   ALBUMIN 2.8 (L) 06/15/2016 0852   AST 18 07/25/2016 0331   AST 10 06/15/2016 0852   ALT 24 07/25/2016 0331   ALT 27 06/15/2016 0852   ALKPHOS 99 07/25/2016 0331   ALKPHOS 77 06/15/2016 0852   BILITOT 0.5 07/25/2016 0331   BILITOT 0.99 06/15/2016 0852   GFRNONAA >60 07/25/2016 0331   GFRAA >60 07/25/2016 0331   Lipase     Component Value Date/Time   LIPASE 21 06/24/2016 1622       Studies/Results: No results found.  Anti-infectives: Anti-infectives    Start     Dose/Rate Route Frequency Ordered Stop   07/19/16 0800  cefoTEtan (CEFOTAN) 1 g in dextrose 5 % 50 mL IVPB     1 g 100 mL/hr over 30 Minutes Intravenous To ShortStay Surgical 07/18/16 1112 07/19/16 1518   07/14/16 2200  cefTRIAXone (ROCEPHIN) 2 g in dextrose 5 % 50 mL IVPB  Status:  Discontinued     2 g 100 mL/hr over 30 Minutes Intravenous Every 24 hours 07/14/16 0915 07/21/16 1309   07/13/16 2200  cefTRIAXone (ROCEPHIN) 1 g in dextrose 5 % 50 mL IVPB  Status:  Discontinued     1 g 100 mL/hr  over 30 Minutes Intravenous Every 24 hours 07/13/16 2132 07/14/16 0915   07/13/16 2200  metroNIDAZOLE (FLAGYL) IVPB 500 mg  Status:  Discontinued     500 mg 100 mL/hr over 60 Minutes Intravenous Every 8 hours 07/13/16 2132 07/21/16 1309     Assessment/Plan Recurrent SBO S/p Lap assisted lysis of adhesions, small bowel resection, and laparoscopic resection of Meckel's diverticulum1/9 Dr. Barry Dienes - POD 7 - path pending - SOFT diet - WBC trending down, 10.8; afebrile  Acute hypoxic respiratory failure secondary to possible aspiration event / Aspiration pneumonitis versus pneumonia- supplemental O2, stable Thrombocytopenia, ABLA- Hgb 8.0, increased appropriately s/p 1 unit. Received 1 uPRBC 1/11, 1 unit 1/15. Transfusions per IM NSCLC- being treated with Keytruda, Dr. [name] Multiple myeloma with skull lesion - in remission Hx PE on Eliquis, new DVT on NOAC s/p filter -  Eliquis held, h/o HIT, on argatroban. Stage 2 sacral ulcer- per wound care  ID - ceftriaxone 1/3>>1/11, flagyl 1/3>>1/11 FEN - soft diet, wean TPN VTE - argatroban  Code status - DNR  Plan -  Continue soft diet Continue therapies/mobility. Patient hoping to go to CIR in next 1-2 days Path back today - positive for metastatic lung cancer             Ok to restart NOAC w/ Eliquis - CBC in AM  Anticipate d/c to CIR when medically stable    LOS: 13 days    Jill Alexanders , [facility] 07/26/2016, 8:17 AM Pager: [phone] Consults: [phone] Mon-Fri 7:00 am-4:30 pm Sat-Sun 7:00 am-11:30 am

## 2016-07-26 NOTE — Care Management Important Message (Signed)
Important Message  Patient Details  Name: [name] MRN: [id number] Date of Birth: [birth year]   Medicare Important Message Given:  Yes    Nathen May 07/26/2016, 12:38 PM

## 2016-07-27 LAB — COMPREHENSIVE METABOLIC PANEL
ALBUMIN: 1.9 g/dL — AB (ref 3.5–5.0)
ALK PHOS: 105 U/L (ref 38–126)
ALT: 25 U/L (ref 17–63)
AST: 17 U/L (ref 15–41)
Anion gap: 8 (ref 5–15)
BUN: 19 mg/dL (ref 6–20)
CHLORIDE: 106 mmol/L (ref 101–111)
CO2: 27 mmol/L (ref 22–32)
Calcium: 7.8 mg/dL — ABNORMAL LOW (ref 8.9–10.3)
Creatinine, Ser: 0.68 mg/dL (ref 0.61–1.24)
GFR calc non Af Amer: >60 mL/min (ref >60)
GLUCOSE: 142 mg/dL — AB (ref 65–99)
Potassium: 3.5 mmol/L (ref 3.5–5.1)
SODIUM: 141 mmol/L (ref 135–145)
Total Bilirubin: 0.5 mg/dL (ref 0.3–1.2)
Total Protein: 3.8 g/dL — ABNORMAL LOW (ref 6.5–8.1)

## 2016-07-27 LAB — PREPARE RBC (CROSSMATCH)

## 2016-07-27 LAB — GLUCOSE, CAPILLARY
GLUCOSE-CAPILLARY: 106 mg/dL — AB (ref 65–99)
Glucose-Capillary: 112 mg/dL — ABNORMAL HIGH (ref 65–99)
Glucose-Capillary: 98 mg/dL (ref 65–99)
Glucose-Capillary: 74 mg/dL (ref 65–99)

## 2016-07-27 LAB — CBC
HCT: 21.9 % — ABNORMAL LOW (ref 39.0–52.0)
HEMOGLOBIN: 7 g/dL — AB (ref 13.0–17.0)
MCH: 30.8 pg (ref 26.0–34.0)
MCHC: 32 g/dL (ref 30.0–36.0)
MCV: 96.5 fL (ref 78.0–100.0)
Platelets: 46 10*3/uL — ABNORMAL LOW (ref 150–400)
RBC: 2.27 MIL/uL — AB (ref 4.22–5.81)
RDW: 21 % — ABNORMAL HIGH (ref 11.5–15.5)
WBC: 9.3 10*3/uL (ref 4.0–10.5)

## 2016-07-27 LAB — APTT: APTT: 29 s (ref 24–36)

## 2016-07-27 MED ORDER — SODIUM CHLORIDE 0.9 % IV SOLN
Freq: Once | INTRAVENOUS | Status: AC
  Administered 2016-07-27: 14:00:00 via INTRAVENOUS

## 2016-07-27 MED ORDER — FUROSEMIDE 10 MG/ML IJ SOLN
20.0000 mg | Freq: Once | INTRAMUSCULAR | Status: AC
  Administered 2016-07-27: 20 mg via INTRAVENOUS
  Filled 2016-07-27: qty 2

## 2016-07-27 MED ORDER — DEXAMETHASONE SODIUM PHOSPHATE 4 MG/ML IJ SOLN
4.0000 mg | Freq: Three times a day (TID) | INTRAMUSCULAR | Status: DC
  Administered 2016-07-27 – 2016-07-29 (×7): 4 mg via INTRAVENOUS
  Filled 2016-07-27 (×7): qty 1

## 2016-07-27 MED ORDER — BACLOFEN 10 MG PO TABS
5.0000 mg | ORAL_TABLET | Freq: Three times a day (TID) | ORAL | Status: DC | PRN
  Administered 2016-07-27 – 2016-07-28 (×4): 5 mg via ORAL
  Filled 2016-07-27 (×4): qty 1

## 2016-07-27 NOTE — Progress Notes (Signed)
PT Cancellation Note  Patient Details Name: [name] MRN: [id number] DOB: 11-Aug-1940   Cancelled Treatment:    Reason Eval/Treat Not Completed: Other (comment) Patient with lengthy conversation with MDs regarding POC. Possible switch towards comfort/hospice, will hold therapies today and await definitive plan.   Duncan Dull 07/27/2016, 3:04 PM Alben Deeds, [place] DPT  [phone]

## 2016-07-27 NOTE — Progress Notes (Signed)
[facility] Progress Note  8 Days Post-Op  Subjective: Denies abdominal pain. Having flatus. Last BM was 2 days ago - feels urge to have a BM this AM. Denies melena. Tolerating PO without nausea/vomiting.  Reports light-headedness yesterday when trying to sit up on edge of bed. Patient to receive 2 units pRBC's today.  Patient reports that due to [employer] he will be unable to go to inpatient rehab and will require placement at SNF vs long term care at home.   Objective: Vital signs in last 24 hours: Temp:  [97.3 F (36.3 C)-98.5 F (36.9 C)] 97.5 F (36.4 C) (01/17 0739) Pulse Rate:  [63-110] 63 (01/17 0739) Resp:  [11-22] 11 (01/17 0739) BP: (69-101)/(47-67) 83/59 (01/17 0739) SpO2:  [95 %-100 %] 95 % (01/17 0739) Weight:  [90.1 kg (198 lb 10.2 oz)] 90.1 kg (198 lb 10.2 oz) (01/17 0431) Last BM Date: 07/26/16  Intake/Output from previous day: 01/16 0701 - 01/17 0700 In: 240 [I.V.:240] Out: 2200 [Urine:2200] Intake/Output this shift: No intake/output data recorded.  PE: Gen:  Alert, NAD, pleasant Pulm:  CTA, no W/R/R Abd: Soft, NT/ND, +BS, abdominal incision c/d/i without surrounding erythema Ext:  No erythema, edema, or tenderness   Lab Results:   Recent Labs  07/26/16 0445 07/27/16 0357  WBC 10.8* 9.3  HGB 8.0* 7.0*  HCT 24.8* 21.9*  PLT 48* 46*   BMET  Recent Labs  07/25/16 0331 07/27/16 0357  NA 140 141  K 3.9 3.5  CL 108 106  CO2 25 27  GLUCOSE 109* 142*  BUN 25* 19  CREATININE 0.62 0.68  CALCIUM 8.0* 7.8*   PT/INR No results for input(s): LABPROT, INR in the last 72 hours. CMP     Component Value Date/Time   NA 141 07/27/2016 0357   NA 132 (L) 06/15/2016 0852   K 3.5 07/27/2016 0357   K 4.1 06/15/2016 0852   CL 106 07/27/2016 0357   CL 109 (H) 12/31/2012 0759   CO2 27 07/27/2016 0357   CO2 23 06/15/2016 0852   GLUCOSE 142 (H) 07/27/2016 0357   GLUCOSE 101 06/15/2016 0852   GLUCOSE 116 (H) 12/31/2012 0759   BUN 19 07/27/2016 0357   BUN 19.0 06/15/2016 0852   CREATININE 0.68 07/27/2016 0357   CREATININE 0.8 06/15/2016 0852   CALCIUM 7.8 (L) 07/27/2016 0357   CALCIUM 8.5 06/15/2016 0852   PROT 3.8 (L) 07/27/2016 0357   PROT 5.6 (L) 06/15/2016 0852   ALBUMIN 1.9 (L) 07/27/2016 0357   ALBUMIN 2.8 (L) 06/15/2016 0852   AST 17 07/27/2016 0357   AST 10 06/15/2016 0852   ALT 25 07/27/2016 0357   ALT 27 06/15/2016 0852   ALKPHOS 105 07/27/2016 0357   ALKPHOS 77 06/15/2016 0852   BILITOT 0.5 07/27/2016 0357   BILITOT 0.99 06/15/2016 0852   GFRNONAA >60 07/27/2016 0357   GFRAA >60 07/27/2016 0357   Lipase     Component Value Date/Time   LIPASE 21 06/24/2016 1622   Anti-infectives: Anti-infectives    Start     Dose/Rate Route Frequency Ordered Stop   07/19/16 0800  cefoTEtan (CEFOTAN) 1 g in dextrose 5 % 50 mL IVPB     1 g 100 mL/hr over 30 Minutes Intravenous To ShortStay Surgical 07/18/16 1112 07/19/16 1518   07/14/16 2200  cefTRIAXone (ROCEPHIN) 2 g in dextrose 5 % 50 mL IVPB  Status:  Discontinued     2 g 100 mL/hr over 30 Minutes Intravenous Every  24 hours 07/14/16 0915 07/21/16 1309   07/13/16 2200  cefTRIAXone (ROCEPHIN) 1 g in dextrose 5 % 50 mL IVPB  Status:  Discontinued     1 g 100 mL/hr over 30 Minutes Intravenous Every 24 hours 07/13/16 2132 07/14/16 0915   07/13/16 2200  metroNIDAZOLE (FLAGYL) IVPB 500 mg  Status:  Discontinued     500 mg 100 mL/hr over 60 Minutes Intravenous Every 8 hours 07/13/16 2132 07/21/16 1309     Assessment/Plan Recurrent SBO S/p Lap assisted lysis of adhesions, small bowel resection, and laparoscopic resection of Meckel's diverticulum1/9 Dr. Barry Dienes - POD 7 - path pending - SOFT diet - WBC trending down, 10.8; afebrile  Acute hypoxic respiratory failure secondary to possible aspiration event / Aspiration pneumonitis versus pneumonia- supplemental O2, stable Thrombocytopenia, ABLA- Hgb 8.0, increased appropriately s/p 1 unit. Received 1  uPRBC 1/11, 1 unit 1/15. Transfusions per IM NSCLC- being treated with Keytruda, Dr. [name] Multiple myeloma with skull lesion - in remission Hx PE on Eliquis, new DVT on NOAC s/p filter - Eliquis held, h/o HIT, on argatroban. Stage 2 sacral ulcer- per wound care  ID - ceftriaxone 1/3>>1/11, flagyl 1/3>>1/11 FEN - soft diet, wean TPN VTE - argatroban  Code status - DNR  Plan - Continue soft diet Surgical path is positive for metastatic lung cancer  Continue therapies/mobility.             Patient dispo pending - denied admission to CIR.  Anemia/thrombocytopenia - hgb 7.0, plts 46; Eliquis re-started 1/16 and then held again due to anemia/thrombocytopenia - No reports to surgical team of melena from patient or nursing staff. Some GIB s/p small bowel resection in a patient on blood thinning medications is not entirely unexpected in the immediate postoperative period. Will continue to monitor closely. Appreciate oncology recommendations.    LOS: 14 days    Jill Alexanders , [facility] 07/27/2016, 9:06 AM Pager: [phone] Consults: [phone] Mon-Fri 7:00 am-4:30 pm Sat-Sun 7:00 am-11:30 am

## 2016-07-27 NOTE — Progress Notes (Addendum)
PROGRESS NOTE  [name] PSS:[id number] DOB: [birth year] DOA: 07/13/2016 PCP: Lavonda Jumbo, MD   LOS: 14 days   Brief Narrative: 76 year old man with history significant for MM and stage IV NSCLC now with mets to brain discovered 04/2016 undergoing radiotherapy to brain lesions and Ketruda, pulmonary embolism on Xarelto. He has been admitted several times in the past few months, initially for SBO back in October for SBO, which resolved with conservative therapy. He was deemed a poor surgical candidate, but it was felt that if he could regain some strength he could undergo surgery. He was admitted again 12/15 with projectile vomiting and SOB. He was then found to have GNR bacteremia. Course became complicated by shock and DVT while he was off DOAC for colitis related GIB. IVC filter placed. He was started on TPN 1/2 and transferred to CIR 1/3. Later that day he developed vomiting again where and was suspected to have aspirated developing hypoxia and significant respiratory distress. Patient admitted with Recurrent SBO and acute hypoxic respiratory failure secondary to aspiration PNA, now s/p laparoscopic LOA 1/9. Patient recovered well postoperatively, he is an ileus which resolved, his NG tube was discontinued and his diet was slowly advanced. He is now tolerating a soft diet. Postop, he has been having intermittent anemia. Marland Kitchen He required 2 units of packed red blood cells in the last week, most recent one on 1/15.   Assessment & Plan: Active Problems:   Multiple myeloma (HCC)   DVT of leg (deep venous thrombosis) (HCC)   History of pulmonary embolism   Peripheral edema   SBO (small bowel obstruction)   Diabetes insipidus (HCC)   Thrombocytopenia (HCC)   Hypoalbuminemia due to protein-calorie malnutrition (HCC)   Primary malignant neoplasm of lung metastatic to other site [facility])   Pressure injury of skin   Aspiration into airway   Goals of care, counseling/discussion   Palliative care by  specialist   Recurrent SBO - Gen. surgery was consulted, patient was taken to the operating room on 1/9 and he is now status post laparoscopic lysis of adhesions. Continue parenteral nutrition, appreciate surgical consultation, wean off if he has adequate by mouth intake. - now having BMs, on soft diet.  -report Black stool.   Thrombocytopenia, Acute blood loss anemia. - Patient with history of hit, was maintained on argatroban. He has been having anemia requiring 2 units of packed red blood cells in the last 7 days.  - He was maintained on Argatroban for his history of DVT and PE. He was transition to Xarelto 1-17.  -Patient having black stool, discussed with Dr Arbutus Ped, hold xarelto, anticoagulation until platelet count above 50,000.  -Continue with protonix.  -Will discussed with surgery if we need to involved GI.  -patient still hypotensive and hb decreasing.   Addison diseases - decadron, Desmopressin.  - hypotensive, added midodrine, continue. Asymptomatic despite low blood pressures. -I will increase decadron to TID in case of adrenal insufficiency.   Anasarca/fluid overload - In the setting of malnutrition and IV fluids, continue Lasix, continue midodrine to support blood pressure - transitioned to po Lasix - Continue Lasix and midorine on discharge, will likely be a slow process which should improve with improvement in his nutritional status   Acute hypoxic Respiratory failure, secondary to possible aspiration event / Aspiration pneumonitis versus pneumonia - was on IV ceftriaxone and flagyl, discontinued 1/11 as he had completed 8 days> Afebrile and stable since.  - Resolved, on room air. Incentive spirometry.  NSCLC (80% PDL1+)-->treating w/ Keytruda > - Dr. [name] is primary oncologist H/o Multiple Myeloma w/ skull lesion s/p XRT 2016 in remission.   H/o PE and DVT on Eliquis-->new DVTs on NOAC 12/21 > S/P temp filter 12/1 - H/O HIT, was maintained on Argatroban  perioperatively, his Xarelto is resumed on 1/16.  -anticoagulation on hold until platelet count above 50,000.   Chronic diastolic HF / hypotension  - Monitor volume status, he appears to have significant lower extremity edema and generalized anasarca, a component of hypoalbuminemia due to malnutrition is present as well.  - Midodrine was added to support blood pressure, he is now on 15 mg 3 times daily  Transaminases - trending down, Now normalized  Hypoalbuminemia  - Pre-albumin 18  AKI - resolved  Hyponatremia - resolved.    Stage 2 sacral ulcer noted 2x2 cms - Wound care consult   DVT prophylaxis: Xarelto  Code Status: DNR Family Communication: Wife [name] Disposition Plan: Awaiting stabilization of hemoglobin.   Consultants:   General surgery   Oncology   Procedures:   Laparoscopic LOA 1/9  Antimicrobials:  Ceftriaxone 1/3 >> 1/11  Flagyl 1/3 >> 1/11  Subjective: He has good spirit.  Mild abdominal pain after he cough.  Report Black stool, runny , twice a day.   Objective: Vitals:   07/26/16 2200 07/26/16 2355 07/27/16 0431 07/27/16 0739  BP: 1'01/65 93/62 94/65 '$ (!) 83/59  Pulse:   69 63  Resp: '17 12 16 11  '$ Temp:  97.6 F (36.4 C) 97.7 F (36.5 C) 97.5 F (36.4 C)  TempSrc:  Oral Oral Oral  SpO2:  95% 96% 95%  Weight:   90.1 kg (198 lb 10.2 oz)   Height:        Intake/Output Summary (Last 24 hours) at 07/27/16 0826 Last data filed at 07/27/16 0600  Gross per 24 hour  Intake              240 ml  Output             2200 ml  Net            -1960 ml   Filed Weights   07/25/16 0500 07/26/16 0321 07/27/16 0431  Weight: 98.2 kg (216 lb 7.9 oz) 93 kg (205 lb) 90.1 kg (198 lb 10.2 oz)    Examination: Constitutional: NAD Vitals:   07/26/16 2200 07/26/16 2355 07/27/16 0431 07/27/16 0739  BP: 1'01/65 93/62 94/65 '$ (!) 83/59  Pulse:   69 63  Resp: '17 12 16 11  '$ Temp:  97.6 F (36.4 C) 97.7 F (36.5 C) 97.5 F (36.4 C)  TempSrc:  Oral Oral Oral    SpO2:  95% 96% 95%  Weight:   90.1 kg (198 lb 10.2 oz)   Height:       Eyes: PERRL, lids and conjunctivae normal Respiratory: clear to auscultation bilaterally, no wheezing, no crackles. Cardiovascular: Regular rate and rhythm, no murmurs / rubs / gallops. 3+ pitting LE edema Abdomen: no tenderness. Bowel sounds decreased. Incision dressing c/d/i Musculoskeletal: no clubbing / cyanosis. Neurologic: non focal    Data Reviewed: I have personally reviewed following labs and imaging studies  CBC:  Recent Labs Lab 07/23/16 0338 07/24/16 0256 07/25/16 0331 07/26/16 0445 07/27/16 0357  WBC 13.5* 12.7* 12.6* 10.8* 9.3  HGB 7.9* 7.6* 7.3* 8.0* 7.0*  HCT 24.3* 23.3* 22.9* 24.8* 21.9*  MCV 93.8 94.7 95.4 94.3 96.5  PLT 47* 52* 52* 48* 46*   Basic Metabolic Panel:  Recent Labs Lab 07/21/16 0430 07/22/16 0500 07/24/16 0256 07/25/16 0331 07/27/16 0357  NA 141 143 137 140 141  K 3.8 4.0 4.0 3.9 3.5  CL 114* 110 108 108 106  CO2 21* '25 24 25 27  '$ GLUCOSE 197* 115* 140* 109* 142*  BUN 24* 23* 28* 25* 19  CREATININE 0.55* 0.57* 0.55* 0.62 0.68  CALCIUM 7.5* 8.1* 7.8* 8.0* 7.8*  MG 1.8 2.1  --   --   --   PHOS 2.3* 3.5  --   --   --    GFR: Estimated Creatinine Clearance: 87 mL/min (by C-G formula based on SCr of 0.68 mg/dL). Liver Function Tests:  Recent Labs Lab 07/21/16 0430 07/24/16 0256 07/25/16 0331 07/27/16 0357  AST '16 16 18 17  '$ ALT '31 20 24 25  '$ ALKPHOS 64 86 99 105  BILITOT 0.4 0.5 0.5 0.5  PROT 3.5* 3.9* 3.8* 3.8*  ALBUMIN 1.4* 1.4* 1.4* 1.9*   No results for input(s): LIPASE, AMYLASE in the last 168 hours. No results for input(s): AMMONIA in the last 168 hours. Coagulation Profile: No results for input(s): INR, PROTIME in the last 168 hours. Cardiac Enzymes: No results for input(s): CKTOTAL, CKMB, CKMBINDEX, TROPONINI in the last 168 hours. BNP (last 3 results) No results for input(s): PROBNP in the last 8760 hours. HbA1C: No results for input(s):  HGBA1C in the last 72 hours. CBG:  Recent Labs Lab 07/26/16 0810 07/26/16 1124 07/26/16 1631 07/26/16 2052 07/27/16 0813  GLUCAP 126* 123* 172* 120* 112*   Lipid Profile: No results for input(s): CHOL, HDL, LDLCALC, TRIG, CHOLHDL, LDLDIRECT in the last 72 hours. Thyroid Function Tests: No results for input(s): TSH, T4TOTAL, FREET4, T3FREE, THYROIDAB in the last 72 hours. Anemia Panel: No results for input(s): VITAMINB12, FOLATE, FERRITIN, TIBC, IRON, RETICCTPCT in the last 72 hours. Urine analysis:    Component Value Date/Time   COLORURINE YELLOW 06/29/2016 2121   APPEARANCEUR CLEAR 06/29/2016 2121   LABSPEC 1.014 06/29/2016 2121   LABSPEC 1.005 05/09/2016 0937   PHURINE 7.0 06/29/2016 2121   GLUCOSEU NEGATIVE 06/29/2016 2121   GLUCOSEU Negative 05/09/2016 0937   HGBUR NEGATIVE 06/29/2016 2121   BILIRUBINUR NEGATIVE 06/29/2016 2121   [place] Negative 05/09/2016 [place] 06/29/2016 2121   PROTEINUR NEGATIVE 06/29/2016 2121   UROBILINOGEN 0.2 05/09/2016 0937   NITRITE NEGATIVE 06/29/2016 2121   LEUKOCYTESUR NEGATIVE 06/29/2016 2121   LEUKOCYTESUR Negative 05/09/2016 7124   Radiology Studies: No results found. Scheduled Meds: . sodium chloride   Intravenous Once  . desmopressin  10 mcg Nasal QHS  . dexamethasone  4 mg Intravenous Q12H  . furosemide  20 mg Oral Daily  . insulin aspart  0-15 Units Subcutaneous TID AC & HS  . mouth rinse  15 mL Mouth Rinse BID  . midodrine  15 mg Oral TID WC  . multivitamin with minerals  1 tablet Oral Daily  . pantoprazole  40 mg Oral BID  . rivaroxaban  20 mg Oral Q lunch   Continuous Infusions: . sodium chloride 10 mL/hr at 07/24/16 2000    Niel Hummer, MD.  [facility] Pager [phone]  If 7PM-7AM, please contact night-coverage [email or web address] Password [facility] 07/27/2016, 8:26 AM

## 2016-07-27 NOTE — Progress Notes (Signed)
OT Cancellation Note  Patient Details Name: MELECIO CUETO MRN: [id number] DOB: 28-Sep-1940   Cancelled Treatment:    Reason Eval/Treat Not Completed: Other (comment). Pt apparently making the decision to switch to comfort care. Will hold today and proceed as needed to assist with any DC needs.   Bliss, OTR/L  [phone] 07/27/2016 07/27/2016, 3:51 PM

## 2016-07-27 NOTE — Progress Notes (Signed)
Patient appears to be having spasms of his diaphragm and hiccups. Patient is uncomfortable. Paged MD to come to bedside to assess. Will continue to monitor.

## 2016-07-27 NOTE — Progress Notes (Signed)
CHART NOTE  [name] called the office today, asked for me to give him a call to discuss some important issues. I called [name] today. I reviewed his current condition with him regarding the new finding of metastatic disease in the small intestine status post resection for the small bowel obstruction. His recovery has been very slow and the patient has been admitted to the hospital several times in the last 2 months. He received only 1 dose of immunotherapy with [name] (pembrolizumab). I discussed with him his goals of care and treatment options including consideration of continuation with immunotherapy or chemotherapy versus palliative care and hospice referral. He is very tired from the frequent hospitalization and very interested in considering hospice care at this point. I reviewed with her decision. He understands that stage IV non-small cell lung cancer is a terminal condition and he would likely die from this disease. I explained to the therapy supportive of his decision and I will continue to provide his hospice care if needed. He will discuss his decision with his wife and children. I will be happy to assist and help him with any of his decision.

## 2016-07-27 NOTE — Progress Notes (Signed)
S/W  [facility]  @ [place] RX # [phone]   1. XARELTO 15 MG DAILY  COVER YES  CO-PAY- 45.00  Q/L OF 2 PER DAY  TIER- 3 DRUG  PRIOR APPROVAL- NO   2. XARELTO 20 MG DAILY  COVER- YES  CO-PAY- $ 45.00 Q/L  ONE PILL PER DAY / IF GO TO TWO PILL  PER DAY WILL NEED P/A # [phone]  TIER- 3 DRUG  PRIOR APPROVAL - NO FOR ONE PILL PER DAY   PHARMACY : [facility]

## 2016-07-27 NOTE — Progress Notes (Signed)
  This NP was requested to visit with patient Hector [name] along with his friend and advocate Dr Domenic Schwab.  Dr Andria Frames shares that the patient is now coming to the realization that he is ready to shift treatment focus from life prolonging to a comfort and dignity approach.  I sat with [name] and Dr Andria Frames as patient verbalized his understanding of his long term prognosis and the fact that he "has had enough, this lung cancer is going to kill me and I'm ready".  During our conversation [name] phoned patient, patient spoke very clearly to his readiness to shift to comfort and welcomed hospice assistance.   Dr Julien [name] verbalized support of his decisions.  [name] reinforces "I'm in the driver seatt".   Patient has not shared his decision yet with his family, he worries they will have a difficult time accepting, but again states, "this is my life".    He wishes to continue on current medical treatment path, treat the treatable,  until he has the opportunity to speak in person with his wife and two daughters.    I have spoken to Dr Micheline Rough, a with the PMT, and he will continue f/u with this patient and assist him as he navigates decisions regarding his EOL care.  We discussed home with hospice services vs hospice facility eligibility.  He is most interested in a hospice facility, as he desires no life prolonging intervetnions once decision is made and plan is in place.  "I don't want this to drag on"  We talked about the emotional and spiritual components of facing ones mortality.  Emotional support offered and I acknowledged his courage as he faces this time in his life. He is encouraged to call with questions or concerns  Wadie Lessen NP  Palliative Medicine Team Team Phone # [phone] Pager [phone]  Total time spent on the unit 60 minutes  Time in 1100-Time out 1200  Greater than 50% of the time was spent in counseling and coordination of care

## 2016-07-28 DIAGNOSIS — Z86711 Personal history of pulmonary embolism: Secondary | ICD-10-CM

## 2016-07-28 LAB — APTT: aPTT: 26 seconds (ref 24–36)

## 2016-07-28 LAB — TYPE AND SCREEN
ABO/RH(D): O POS
ANTIBODY SCREEN: NEGATIVE
Unit division: 0
Unit division: 0
Unit division: 0

## 2016-07-28 LAB — BASIC METABOLIC PANEL
Anion gap: 7 (ref 5–15)
BUN: 16 mg/dL (ref 6–20)
CALCIUM: 7.7 mg/dL — AB (ref 8.9–10.3)
CO2: 28 mmol/L (ref 22–32)
CREATININE: 0.59 mg/dL — AB (ref 0.61–1.24)
Chloride: 104 mmol/L (ref 101–111)
GFR calc non Af Amer: >60 mL/min (ref >60)
Glucose, Bld: 116 mg/dL — ABNORMAL HIGH (ref 65–99)
Potassium: 3 mmol/L — ABNORMAL LOW (ref 3.5–5.1)
SODIUM: 139 mmol/L (ref 135–145)

## 2016-07-28 LAB — GLUCOSE, CAPILLARY
GLUCOSE-CAPILLARY: 145 mg/dL — AB (ref 65–99)
GLUCOSE-CAPILLARY: 142 mg/dL — AB (ref 65–99)

## 2016-07-28 LAB — MRSA PCR SCREENING: MRSA BY PCR: NEGATIVE

## 2016-07-28 LAB — CBC
HCT: 29.3 % — ABNORMAL LOW (ref 39.0–52.0)
Hemoglobin: 9.6 g/dL — ABNORMAL LOW (ref 13.0–17.0)
MCH: 30.6 pg (ref 26.0–34.0)
MCHC: 33.1 g/dL (ref 30.0–36.0)
MCV: 92.4 fL (ref 78.0–100.0)
Platelets: 42 10*3/uL — ABNORMAL LOW (ref 150–400)
RBC: 3.17 MIL/uL — ABNORMAL LOW (ref 4.22–5.81)
RDW: 21 % — AB (ref 11.5–15.5)
WBC: 9.3 10*3/uL (ref 4.0–10.5)

## 2016-07-28 MED ORDER — ENSURE ENLIVE PO LIQD
237.0000 mL | Freq: Two times a day (BID) | ORAL | Status: DC
  Administered 2016-07-28 – 2016-07-29 (×2): 237 mL via ORAL

## 2016-07-28 MED ORDER — POTASSIUM CHLORIDE CRYS ER 20 MEQ PO TBCR: 40.0000 meq | EXTENDED_RELEASE_TABLET | Freq: Once | ORAL | Status: DC

## 2016-07-28 NOTE — Care Management Note (Addendum)
Case Management Note  Patient Details  Name: WORLEY RADERMACHER MRN: [id number] Date of Birth: 09-16-1940  Subjective/Objective:     NCM received referral from Palliative MD  [place], patient wants to go home with hospice,  NCM offered choice to wife and gave her a private duty list as well , Mrs Schubert they chose [facility], referral made to [facility].  Wife states they will need a hospital bed, a w/chair and a bedside table.  She states they already have a bsc and a walker.  NCM confirmed that patient will need to go by ambulance to address [place], [place] [address].   Mrs Blue cell is [phone] and home is [phone].  1/19 [place], BSN - NCM spoke with wife , Mrs Sanguinetti, she has received DME at the home, she will like for [facility] to pick up patient at 1 pm, NCM notified Stacie with [facility] that pick up time is 1 pm.  NCM will fax dc sum to [phone]. NCM will inform Maggie RN of pick up time as well.                              Action/Plan:   Expected Discharge Date:                  Expected Discharge Plan:  Home w Hospice Care  In-House Referral:  Clinical Social Work  Discharge planning Services  CM Consult  Post Acute Care Choice:    Choice offered to:  Spouse  DME Arranged:    DME Agency:     HH Arranged:  RN [place] Agency:  [facility]osp[facility] and [place] of Service:  Completed, signed off  If discussed at [employer], dates discussed:    Additional Comments:  Duran, Ohern, RN 07/28/2016, 1:54 PM

## 2016-07-28 NOTE — Progress Notes (Signed)
PT Cancellation Note  Patient Details Name: [name] MRN: [id number] DOB: 28-May-1941   Cancelled Treatment:    Reason Eval/Treat Not Completed: Other (comment). PT order has been discontinued by MD as pt wishes to go home with Hospice care. PT signing off.    [place] 07/28/2016, 1:02 PM

## 2016-07-28 NOTE — Progress Notes (Signed)
PROGRESS NOTE  RULON ABDALLA JXB:[id number] DOB: 06-15-1941 DOA: 07/13/2016 PCP: [name], MD   LOS: 15 days   Brief Narrative: 76 year old man with history significant for MM and stage IV NSCLC now with mets to brain discovered 04/2016 undergoing radiotherapy to brain lesions and Ketruda, pulmonary embolism on Xarelto. He has been admitted several times in the past few months, initially for SBO back in October for SBO, which resolved with conservative therapy. He was deemed a poor surgical candidate, but it was felt that if he could regain some strength he could undergo surgery. He was admitted again 12/15 with projectile vomiting and SOB. He was then found to have GNR bacteremia. Course became complicated by shock and DVT while he was off DOAC for colitis related GIB. IVC filter placed. He was started on TPN 1/2 and transferred to CIR 1/3. Later that day he developed vomiting again where and was suspected to have aspirated developing hypoxia and significant respiratory distress. Patient admitted with Recurrent SBO and acute hypoxic respiratory failure secondary to aspiration PNA, now s/p laparoscopic LOA 1/9. Patient recovered well postoperatively, he is an ileus which resolved, his NG tube was discontinued and his diet was slowly advanced. He is now tolerating a soft diet. Postop, he has been having intermittent anemia. Marland Kitchen He required 2 units of packed red blood cells in the last week, most recent one on 1/15.   Assessment & Plan: Active Problems:   Multiple myeloma (HCC)   DVT of leg (deep venous thrombosis) (HCC)   History of pulmonary embolism   Peripheral edema   SBO (small bowel obstruction)   Diabetes insipidus (HCC)   Thrombocytopenia (HCC)   Hypoalbuminemia due to protein-calorie malnutrition (HCC)   Primary malignant neoplasm of lung metastatic to other site [facility])   Pressure injury of skin   Aspiration into airway   Goals of care, counseling/discussion   Palliative care by  specialist   Recurrent SBO - Gen. surgery was consulted, patient was taken to the operating room on 1/9 and he is now status post laparoscopic lysis of adhesions. Continue parenteral nutrition, appreciate surgical consultation, wean off if he has adequate by mouth intake. - now having BMs, on soft diet.  Tolerating diet, report stool more brown.   Thrombocytopenia, Acute blood loss anemia. - Patient with history of hit, was maintained on argatroban. He has been having anemia requiring 2 units of packed red blood cells in the last week.  - He was maintained on Argatroban for his history of DVT and PE. He was transition to [place] 1-17.  -Patient having black stool, discussed with Dr [name], hold xarelto, anticoagulation until platelet count above 50,000.  -S/P 2 units PRBC.   Addison diseases - decadron, Desmopressin.  -continue with midodrine.    Anasarca/fluid overload - In the setting of malnutrition and IV fluids, continue Lasix, continue midodrine to support blood pressure - transitioned to po Lasix - Continue Lasix and midorine on discharge, will likely be a slow process which should improve with improvement in his nutritional status   Acute hypoxic Respiratory failure, secondary to possible aspiration event / Aspiration pneumonitis versus pneumonia - was on IV ceftriaxone and flagyl, discontinued 1/11 as he had completed 8 days> Afebrile and stable since.  - Resolved, on room air. Incentive spirometry.    NSCLC (80% PDL1+)-->treating w/ Keytruda > - Dr. [name] is primary oncologist H/o Multiple Myeloma w/ skull lesion s/p XRT 2016 in remission.   H/o PE and DVT on Eliquis-->new  DVTs on NOAC 12/21 > S/P temp filter 12/1 - H/O HIT, was maintained on Argatroban perioperatively, his Xarelto is resumed on 1/16.  -anticoagulation on hold until platelet count above 50,000.   Chronic diastolic HF / hypotension  - Monitor volume status, he appears to have significant lower  extremity edema and generalized anasarca, a component of hypoalbuminemia due to malnutrition is present as well.  - Midodrine was added to support blood pressure, he is now on 15 mg 3 times daily  Transaminases - trending down, Now normalized  Hypoalbuminemia  - Pre-albumin 18  AKI - resolved  Hyponatremia - resolved.    Stage 2 sacral ulcer noted 2x2 cms - Wound care consult   DVT prophylaxis: Xarelto  Code Status: DNR Family Communication: Wife [name] 1-17  Disposition Plan: Mr Fuson wants to go home with hospice. Will await for palliative care team to help with this.   Consultants:   General surgery   Oncology   Procedures:   Laparoscopic LOA 1/9  Antimicrobials:  Ceftriaxone 1/3 >> 1/11  Flagyl 1/3 >> 1/11  Subjective: He met with his family, went well.  He wants to go home, with hospice. He is willing to take his midodrine and others oral medications.      Objective: Vitals:   07/28/16 0154 07/28/16 0400 07/28/16 0500 07/28/16 0801  BP: (!) 86/57 (!) 88/55  (!) 85/62  Pulse: 71 68  72  Resp: '11 12  15  '$ Temp: 98.1 F (36.7 C) 97.6 F (36.4 C)  98 F (36.7 C)  TempSrc: Oral Oral  Oral  SpO2: 97% 98%  97%  Weight:   (!) 193.2 kg (425 lb 14.9 oz)   Height:        Intake/Output Summary (Last 24 hours) at 07/28/16 0919 Last data filed at 07/28/16 0154  Gross per 24 hour  Intake           917.92 ml  Output             3200 ml  Net         -2282.08 ml   Filed Weights   07/26/16 0321 07/27/16 0431 07/28/16 0500  Weight: 93 kg (205 lb) 90.1 kg (198 lb 10.2 oz) (!) 193.2 kg (425 lb 14.9 oz)    Examination: Constitutional: NAD Vitals:   07/28/16 0154 07/28/16 0400 07/28/16 0500 07/28/16 0801  BP: (!) 86/57 (!) 88/55  (!) 85/62  Pulse: 71 68  72  Resp: '11 12  15  '$ Temp: 98.1 F (36.7 C) 97.6 F (36.4 C)  98 F (36.7 C)  TempSrc: Oral Oral  Oral  SpO2: 97% 98%  97%  Weight:   (!) 193.2 kg (425 lb 14.9 oz)   Height:       Eyes: PERRL, lids  and conjunctivae normal Respiratory: clear to auscultation bilaterally, no wheezing, no crackles. Cardiovascular: Regular rate and rhythm, no murmurs / rubs / gallops. 3+ pitting LE edema Abdomen: no tenderness. Bowel sounds decreased. Incision dressing c/d/i Musculoskeletal: no clubbing / cyanosis. Neurologic: non focal    Data Reviewed: I have personally reviewed following labs and imaging studies  CBC:  Recent Labs Lab 07/24/16 0256 07/25/16 0331 07/26/16 0445 07/27/16 0357 07/28/16 0237  WBC 12.7* 12.6* 10.8* 9.3 9.3  HGB 7.6* 7.3* 8.0* 7.0* 9.6*  HCT 23.3* 22.9* 24.8* 21.9* 29.3*  MCV 94.7 95.4 94.3 96.5 92.4  PLT 52* 52* 48* 46* 42*   Basic Metabolic Panel:  Recent Labs Lab 07/22/16 0500  07/24/16 0256 07/25/16 0331 07/27/16 0357 07/28/16 0237  NA 143 137 140 141 139  K 4.0 4.0 3.9 3.5 3.0*  CL 110 108 108 106 104  CO2 '25 24 25 27 28  '$ GLUCOSE 115* 140* 109* 142* 116*  BUN 23* 28* 25* 19 16  CREATININE 0.57* 0.55* 0.62 0.68 0.59*  CALCIUM 8.1* 7.8* 8.0* 7.8* 7.7*  MG 2.1  --   --   --   --   PHOS 3.5  --   --   --   --    GFR: Estimated Creatinine Clearance: 133.5 mL/min (by C-G formula based on SCr of 0.59 mg/dL (L)). Liver Function Tests:  Recent Labs Lab 07/24/16 0256 07/25/16 0331 07/27/16 0357  AST '16 18 17  '$ ALT '20 24 25  '$ ALKPHOS 86 99 105  BILITOT 0.5 0.5 0.5  PROT 3.9* 3.8* 3.8*  ALBUMIN 1.4* 1.4* 1.9*   No results for input(s): LIPASE, AMYLASE in the last 168 hours. No results for input(s): AMMONIA in the last 168 hours. Coagulation Profile: No results for input(s): INR, PROTIME in the last 168 hours. Cardiac Enzymes: No results for input(s): CKTOTAL, CKMB, CKMBINDEX, TROPONINI in the last 168 hours. BNP (last 3 results) No results for input(s): PROBNP in the last 8760 hours. HbA1C: No results for input(s): HGBA1C in the last 72 hours. CBG:  Recent Labs Lab 07/27/16 0813 07/27/16 1217 07/27/16 1700 07/27/16 2130 07/28/16 0836    GLUCAP 112* 98 74 106* 145*   Lipid Profile: No results for input(s): CHOL, HDL, LDLCALC, TRIG, CHOLHDL, LDLDIRECT in the last 72 hours. Thyroid Function Tests: No results for input(s): TSH, T4TOTAL, FREET4, T3FREE, THYROIDAB in the last 72 hours. Anemia Panel: No results for input(s): VITAMINB12, FOLATE, FERRITIN, TIBC, IRON, RETICCTPCT in the last 72 hours. Urine analysis:    Component Value Date/Time   COLORURINE YELLOW 06/29/2016 2121   APPEARANCEUR CLEAR 06/29/2016 2121   LABSPEC 1.014 06/29/2016 2121   LABSPEC 1.005 05/09/2016 0937   PHURINE 7.0 06/29/2016 2121   GLUCOSEU NEGATIVE 06/29/2016 2121   GLUCOSEU Negative 05/09/2016 0937   HGBUR NEGATIVE 06/29/2016 2121   BILIRUBINUR NEGATIVE 06/29/2016 2121   [place] Negative 05/09/2016 [place] 06/29/2016 2121   PROTEINUR NEGATIVE 06/29/2016 2121   UROBILINOGEN 0.2 05/09/2016 0937   NITRITE NEGATIVE 06/29/2016 2121   LEUKOCYTESUR NEGATIVE 06/29/2016 2121   LEUKOCYTESUR Negative 05/09/2016 1062   Radiology Studies: No results found. Scheduled Meds: . desmopressin  10 mcg Nasal QHS  . dexamethasone  4 mg Intravenous Q8H  . furosemide  20 mg Oral Daily  . insulin aspart  0-15 Units Subcutaneous TID AC & HS  . mouth rinse  15 mL Mouth Rinse BID  . midodrine  15 mg Oral TID WC  . multivitamin with minerals  1 tablet Oral Daily  . pantoprazole  40 mg Oral BID   Continuous Infusions: . sodium chloride 10 mL/hr at 07/24/16 2000    Niel Hummer, MD.  [facility] Pager [phone]  If 7PM-7AM, please contact night-coverage [email or web address] Password [facility] 07/28/2016, 9:19 AM

## 2016-07-28 NOTE — Care Management Note (Signed)
Case Management Note  Patient Details  Name: [name] MRN: [id number] Date of Birth: March 18, 1941  Subjective/Objective:  NCM received referral from Palliative MD  [place], patient wants to go home with hospice,  NCM offered choice to wife and gave her a private duty list as well , [name] they chose [facility], referral [facility] to [facility].  Wife states they will need a hospital bed, a w/chair and a bedside table.  She states they already have a bsc and a walker.  NCM confirmed that patient will need to go by ambulance to address [place], [place] [address].   Mrs Naval cell is [phone] and home is [phone].                     Action/Plan:   Expected Discharge Date:                  Expected Discharge Plan:  Home w Hospice Care  In-House Referral:  Clinical Social Work  Discharge planning Services  CM Consult  Post Acute Care Choice:    Choice offered to:  Spouse  DME Arranged:    DME Agency:     HH Arranged:    Superior Agency:     Status of Service:  Completed, signed off  If discussed at [employer], dates discussed:    Additional Comments:  Toa, Mia, RN 07/28/2016, 1:49 PM

## 2016-07-28 NOTE — Progress Notes (Signed)
Patient ID: [name], male   DOB: [birth year], 76 y.o.   MRN: [id number]  [facility] Surgery Progress Note  9 Days Post-Op  Subjective: Slept well last night. No complaints this morning. Tolerating regular diet. Denies any current abdominal pain. Dark BM yesterday. Trying to decide if he would rather go home with hospice vs hospice facility. Currently thinking he would like to go home, but plans to discuss further with family today.  Objective: Vital signs in last 24 hours: Temp:  [97.5 F (36.4 C)-98.2 F (36.8 C)] 97.6 F (36.4 C) (01/18 0400) Pulse Rate:  [59-76] 68 (01/18 0400) Resp:  [11-20] 12 (01/18 0400) BP: (81-113)/(52-67) 88/55 (01/18 0400) SpO2:  [95 %-100 %] 98 % (01/18 0400) Weight:  [425 lb 14.9 oz (193.2 kg)] 425 lb 14.9 oz (193.2 kg) (01/18 0500) Last BM Date: 07/27/16  Intake/Output from previous day: 01/17 0701 - 01/18 0700 In: 917.9 [P.O.:240; Blood:677.9] Out: 3200 [Urine:3200] Intake/Output this shift: No intake/output data recorded.  PE: Gen:  Alert, NAD, pleasant Pulm:  CTAB, effort normal Abd: Soft, NT/ND, +BS, midline abdominal incision c/d/i without surrounding erythema or drainage  Lab Results:   Recent Labs  07/27/16 0357 07/28/16 0237  WBC 9.3 9.3  HGB 7.0* 9.6*  HCT 21.9* 29.3*  PLT 46* 42*   BMET  Recent Labs  07/27/16 0357 07/28/16 0237  NA 141 139  K 3.5 3.0*  CL 106 104  CO2 27 28  GLUCOSE 142* 116*  BUN 19 16  CREATININE 0.68 0.59*  CALCIUM 7.8* 7.7*   PT/INR No results for input(s): LABPROT, INR in the last 72 hours. CMP     Component Value Date/Time   NA 139 07/28/2016 0237   NA 132 (L) 06/15/2016 0852   K 3.0 (L) 07/28/2016 0237   K 4.1 06/15/2016 0852   CL 104 07/28/2016 0237   CL 109 (H) 12/31/2012 0759   CO2 28 07/28/2016 0237   CO2 23 06/15/2016 0852   GLUCOSE 116 (H) 07/28/2016 0237   GLUCOSE 101 06/15/2016 0852   GLUCOSE 116 (H) 12/31/2012 0759   BUN 16 07/28/2016 0237   BUN 19.0  06/15/2016 0852   CREATININE 0.59 (L) 07/28/2016 0237   CREATININE 0.8 06/15/2016 0852   CALCIUM 7.7 (L) 07/28/2016 0237   CALCIUM 8.5 06/15/2016 0852   PROT 3.8 (L) 07/27/2016 0357   PROT 5.6 (L) 06/15/2016 0852   ALBUMIN 1.9 (L) 07/27/2016 0357   ALBUMIN 2.8 (L) 06/15/2016 0852   AST 17 07/27/2016 0357   AST 10 06/15/2016 0852   ALT 25 07/27/2016 0357   ALT 27 06/15/2016 0852   ALKPHOS 105 07/27/2016 0357   ALKPHOS 77 06/15/2016 0852   BILITOT 0.5 07/27/2016 0357   BILITOT 0.99 06/15/2016 0852   GFRNONAA >60 07/28/2016 0237   GFRAA >60 07/28/2016 0237   Lipase     Component Value Date/Time   LIPASE 21 06/24/2016 1622       Studies/Results: No results found.  Anti-infectives: Anti-infectives    Start     Dose/Rate Route Frequency Ordered Stop   07/19/16 0800  cefoTEtan (CEFOTAN) 1 g in dextrose 5 % 50 mL IVPB     1 g 100 mL/hr over 30 Minutes Intravenous To [facility] 07/18/16 1112 07/19/16 1518   07/14/16 2200  cefTRIAXone (ROCEPHIN) 2 g in dextrose 5 % 50 mL IVPB  Status:  Discontinued     2 g 100 mL/hr over 30 Minutes Intravenous Every 24 hours 07/14/16  0915 07/21/16 1309   07/13/16 2200  cefTRIAXone (ROCEPHIN) 1 g in dextrose 5 % 50 mL IVPB  Status:  Discontinued     1 g 100 mL/hr over 30 Minutes Intravenous Every 24 hours 07/13/16 2132 07/14/16 0915   07/13/16 2200  metroNIDAZOLE (FLAGYL) IVPB 500 mg  Status:  Discontinued     500 mg 100 mL/hr over 60 Minutes Intravenous Every 8 hours 07/13/16 2132 07/21/16 1309       Assessment/Plan Recurrent SBO S/p Lap assisted lysis of adhesions, small bowel resection, and laparoscopic resection of Meckel's diverticulum1/9 Dr. Barry Dienes - POD 9 - path: METASTATIC ADENOCARCINOMA - SOFT diet - WBC WNL 9.3, afebrile  Acute hypoxic respiratory failure secondary to possible aspiration event / Aspiration pneumonitis versus pneumonia- supplemental O2, stable Thrombocytopenia, ABLA- Hgb 9.6, s/p 2uPRBC yesterday.  Transfusions per IM.  NSCLC- being treated with Keytruda, Dr. [name] Multiple myeloma with skull lesion - in remission Hx PE on Eliquis, new DVT on NOAC s/p filter - Eliquis held, h/o HIT, on argatroban. Stage 2 sacral ulcer- per wound care  ID - ceftriaxone 1/3>>1/11, flagyl 1/3>>1/11 FEN - soft diet VTE - argatroban  Code status - DNR  Plan - Continue soft diet. Continue therapies/mobility. Hg/hmt rose appropriately given [facility] yesterday. Would not recommend GI consult to evaluate anemia; even if there was an enteric bleed, it would likely at point at anasotomosis and an area they will be able to reach with scope. Will continue to monitor. Patient trying to decide home with hospice vs hospice facility.    LOS: 15 days    [name] , [facility] Surgery 07/28/2016, 7:37 AM Pager: [phone] Consults: [phone] Mon-Fri 7:00 am-4:30 pm Sat-Sun 7:00 am-11:30 am

## 2016-07-28 NOTE — Progress Notes (Signed)
Notified by Neoma Laming [facility] of family request for Hospice and [facility] services at home after discharge. Chart and patient information currently under review to confirm hospice eligibility.  Spoke with patient's wife, [name], via phone conversation to initiate education related to hospice philosophy, services and team approach to care. Family verbalized understanding of the information provided. Per discussion, plan is for discharge to home by [facility] tomorrow.  Please send signed completed DNR form home with patient.  Patient will need prescriptions for discharge comfort medications.  DME needs discussed and family requested hospital bed, W/C and OBT for delivery to the home this evening or in the morning. [facility] equipment manager Jewel Ysidro Evert notified and will contact [place] to arrange delivery to the home.The home address has been verified and is correct in the chart; [name] to be contacted to arrange time of delivery.  [facility] aware of the above. Completed discharge summary will need to be faxed to [facility] at [phone] when final. Please notify [facility] when patient is ready to leave unit at discharge-call [phone].  [facility] information and contact numbers have been given toJo.   Please call with any questions.  Thank Rhae Lerner RN, [facility] Liaison [phone]

## 2016-07-28 NOTE — Progress Notes (Signed)
Nutrition Follow-up  DOCUMENTATION CODES:   Severe malnutrition in context of chronic illness  INTERVENTION:    Ensure Enlive po BID, each supplement provides 350 kcal and 20 grams of protein  NUTRITION DIAGNOSIS:   Malnutrition (Severe) related to chronic illness as evidenced by energy intake < or equal to 75% for > or equal to 1 month, severe depletion of muscle mass, severe fluid accumulation.  Ongoing  GOAL:   Patient will meet greater than or equal to 90% of their needs  Progressing  MONITOR:   I & O's, Labs, Skin  ASSESSMENT:   Pt with PMH of MM and stage IV NSCLC now with mets to brain discovered 04/2016 undergoing XRT to brain lesions and Nat Math, PE on Xarelto. He has been admitted several times in the past few months, initially for SBO back in October, which resolved with conservative therapy. He was deemed a poor surgical candidate, but it was felt that if he could regain some strength he could undergo surgery. He was admitted again 12/15 with projectile vomiting and SOB. He was then found to have GNR bacteremia. Course became complicated by shock and DVT while he was off DOAC for colitis related GIB. IVC filter placed. He was started on TPN 1/2 and transferred to CIR 1/3. Later that day he developed vomiting again where and was suspected to have aspirated developing hypoxia and significant respiratory distress.  Diet has been advanced to soft. Patient consuming 50-100% of meals. TPN off. Patient wanting to go home with Hospice. Palliative Care Team following. Labs and medications reviewed.  Diet Order:  DIET SOFT Room service appropriate? Yes; Fluid consistency: Thin  Skin:  Wound (see comment) (Non-pressure R foot wound, stage II sacrum)  Last BM:  1/17  Height:   Ht Readings from Last 1 Encounters:  07/13/16 '5\' 8"'$  (1.727 m)    Weight:   Wt Readings from Last 1 Encounters:  07/28/16 (!) 425 lb 14.9 oz (193.2 kg)    Ideal Body Weight:  70 kg  BMI:   Body mass index is 64.76 kg/m.  Estimated Nutritional Needs:   Kcal:  2150-2350  Protein:  115-125  Fluid:  > 2 L/day  EDUCATION NEEDS:   No education needs identified at this time  Molli Barrows, [place], [place], [place] Pager [phone] After Hours Pager [phone]

## 2016-07-28 NOTE — Progress Notes (Signed)
Palliative care note: Reason for consult: Goals of care in light of stage IV lung cancer, recurrent bowel obstruction, anemia  I met today with Mr. Brightwell, his wife, and his daughter.  Mr. Dorsch reports that he understands that he is reaching the end of his life and his main goal is to be at home and be comfortable.  He discussed this yesterday with his friend (Dr. Andria Frames) and Sharolyn Douglas from our team.  He has been telling staff that his goal is to go home with hospice support.  He reports talking to his wife and daughter about this and they respect (but not necessarily agree with) his decision.  He reports appreciating all the care he received to this point in time, but states "I've been here 6 weeks, I don't really feel any different, and I'm not going to get better."  We discussed pathways forward and he is clear in his desire to transition home with hospice support.  We discussed options for residential and home hospice.  We discussed prognosis (will largely be determined by if he has continued bleeding).  If bleeding continues, his prognosis is likely days.  Otherwise, I would anticipate that he has weeks at best and should qualify for hospice support at home.   We talked about the emotional and spiritual components of facing ones mortality.  Emotional support offered and I acknowledged his courage as he faces this time in his life. He is encouraged to call with questions or concerns  Micheline Rough, MD [place] Team [phone]  Total time spent on the unit 60 minutes  Time in 1130-Time out 1230  Greater than 50% of the time was spent in counseling and coordination of care

## 2016-07-29 LAB — CBC
HCT: 34.4 % — ABNORMAL LOW (ref 39.0–52.0)
Hemoglobin: 11.2 g/dL — ABNORMAL LOW (ref 13.0–17.0)
MCH: 30.3 pg (ref 26.0–34.0)
MCHC: 32.6 g/dL (ref 30.0–36.0)
MCV: 93 fL (ref 78.0–100.0)
PLATELETS: 43 10*3/uL — AB (ref 150–400)
RBC: 3.7 MIL/uL — ABNORMAL LOW (ref 4.22–5.81)
RDW: 20.3 % — ABNORMAL HIGH (ref 11.5–15.5)
WBC: 12.3 10*3/uL — ABNORMAL HIGH (ref 4.0–10.5)

## 2016-07-29 LAB — BASIC METABOLIC PANEL
Anion gap: 9 (ref 5–15)
BUN: 18 mg/dL (ref 6–20)
CO2: 28 mmol/L (ref 22–32)
CREATININE: 0.71 mg/dL (ref 0.61–1.24)
Calcium: 8 mg/dL — ABNORMAL LOW (ref 8.9–10.3)
Chloride: 101 mmol/L (ref 101–111)
GFR calc Af Amer: >60 mL/min (ref >60)
GLUCOSE: 125 mg/dL — AB (ref 65–99)
Potassium: 3.4 mmol/L — ABNORMAL LOW (ref 3.5–5.1)
SODIUM: 138 mmol/L (ref 135–145)

## 2016-07-29 LAB — APTT: aPTT: 27 seconds (ref 24–36)

## 2016-07-29 MED ORDER — DEXAMETHASONE 4 MG PO TABS: 4.0000 mg | ORAL_TABLET | Freq: Three times a day (TID) | ORAL | 0 refills | Status: AC

## 2016-07-29 MED ORDER — ONDANSETRON HCL 8 MG PO TABS: 8.0000 mg | ORAL_TABLET | Freq: Three times a day (TID) | ORAL | 1 refills | Status: AC | PRN

## 2016-07-29 MED ORDER — LORAZEPAM 0.5 MG PO TABS: 0.5000 mg | ORAL_TABLET | Freq: Four times a day (QID) | ORAL | 0 refills | Status: DC | PRN

## 2016-07-29 MED ORDER — BACLOFEN 10 MG PO TABS: 5.0000 mg | ORAL_TABLET | Freq: Three times a day (TID) | ORAL | 0 refills | Status: AC | PRN

## 2016-07-29 MED ORDER — OXYCODONE HCL 5 MG PO TABS: 5.0000 mg | ORAL_TABLET | ORAL | 0 refills | Status: DC | PRN

## 2016-07-29 MED ORDER — ALUM & MAG HYDROXIDE-SIMETH 200-200-20 MG/5ML PO SUSP
15.0000 mL | ORAL | Status: DC | PRN
  Administered 2016-07-29: 15 mL via ORAL
  Filled 2016-07-29: qty 30

## 2016-07-29 MED ORDER — ENSURE ENLIVE PO LIQD: 237.0000 mL | Freq: Two times a day (BID) | ORAL | 12 refills | Status: AC

## 2016-07-29 MED ORDER — MIDODRINE HCL 5 MG PO TABS: 15.0000 mg | ORAL_TABLET | Freq: Three times a day (TID) | ORAL | 0 refills | Status: AC

## 2016-07-29 NOTE — Progress Notes (Signed)
Received order to d/c patient.  Port remaining accessed at discharge.  Pt to transport home with hospice care.  Reviewed discharge paperwork and medications/prescriptions with wife and daughter at bedside.  Provided emotional support to family.

## 2016-07-29 NOTE — Discharge Summary (Addendum)
Physician Discharge Summary  STRYKER VEASEY ZOX:[id number] DOB: Jan 29, 1941 DOA: 07/13/2016  PCP: [name], MD  Admit date: 07/13/2016 Discharge date: 07/29/2016  Admitted From: CIR Disposition:  Home with hospice.   Recommendations for Outpatient Follow-up:  1. Patient will be discharge home with hospice.    Discharge Condition: Stable.  CODE STATUS: DNR Diet recommendation: regular diet   Brief/Interim Summary: 76 year old man with history significant for MM and stage IV NSCLC now with mets to brain discovered 04/2016 undergoing radiotherapy to brain lesions and Ketruda, pulmonary embolism on Xarelto. He has been admitted several times in the past few months, initially for SBO back in October for SBO, which resolved with conservative therapy. He was deemed a poor surgical candidate, but it was felt that if he could regain some strength he could undergo surgery. He was admitted again 12/15 with projectile vomiting and SOB. He was then found to have GNR bacteremia. Course became complicated by shock and DVT while he was off DOAC for colitis related GIB. IVC filter placed. He was started on TPN 1/2 and transferred to CIR 1/3. Later that day he developed vomiting again where and was suspected to have aspirated developing hypoxia and significant respiratory distress. Patient admitted with Recurrent SBO and acute hypoxic respiratory failure secondary to aspiration PNA, now s/p laparoscopic LOA 1/9. Patient recovered well postoperatively, he is an ileus which resolved, his NG tube was discontinued and his diet was slowly advanced. He is now tolerating a soft diet. Postop, he has been having intermittent anemia. Marland Kitchen He required 2 units of packed red blood cells in the last week. 2 more units of PRBC 1-18. Patient has been tolerating diet, he has been having black stool, probably intestinal bleed. Today hb is stable. xarelto was discontinue. Patinet continue to have hypotension. He was started on midodrine.   Patient spoke with Dr [name], and patient has decided now to go home with hospice. He wants to focus on comfort care.   Assessment & Plan:  Recurrent SBO, secondary to metastasis diseases  to small bowel  - Gen. surgery was consulted, patient was taken to the operating room on 1/9 and he is now status post laparoscopic lysis of adhesions. Continue parenteral nutrition, appreciate surgical consultation, wean off if he has adequate by mouth intake. - now having BMs, on soft diet.  Tolerating diet.   Thrombocytopenia, Acute blood loss anemia. - Patient with history of hit, was maintained on argatroban. He has been having anemia requiring 2 units of packed red blood cells in the last week.  - He was maintained on Argatroban for his history of DVT and PE. He was transition to [place] 1-17.  -Patient having black stool, discussed with Dr [name], hold xarelto, anticoagulation until platelet count above 50,000.  -S/P 2 units PRBC.  -hb stable   Addison diseases, hypotension  - decadron, Desmopressin.  -continue with midodrine.    Anasarca/fluid overload - In the setting of malnutrition and IV fluids, continue Lasix, continue midodrine to support blood pressure - transitioned to po Lasix - Continue Lasix and midorine on discharge.   Acute hypoxic Respiratory failure, secondary to possible aspiration event / Aspiration pneumonitis versus pneumonia - was on IV ceftriaxone and flagyl, discontinued 1/11 as he had completed 8 days> Afebrile and stable since.  - Resolved, on room air. Incentive spirometry.  PRN lasix at discharge  NSCLC (80% PDL1+)-->treating w/ Keytruda > - Dr. [name] is primary oncologist H/o Multiple Myeloma w/ skull lesion s/p XRT 2016  in remission.   H/o PE and DVT on Eliquis-->new DVTs on NOAC 12/21 > S/P temp filter 12/1 - H/O HIT, was maintained on Argatroban perioperatively, his Xarelto is resumed on 1/16.  -anticoagulation on hold until platelet count  above 50,000.  -discontinue xarelto in events of bleeding.   Chronic diastolic HF / hypotension  - Monitor volume status, he appears to have significant lower extremity edema and generalized anasarca, a component of hypoalbuminemia due to malnutrition is present as well.  - Midodrine was added to support blood pressure, he is now on 15 mg 3 times daily -discharge on lasix PRN.  Transaminases - trending down, Now normalized  Hypoalbuminemia  - Pre-albumin 18  AKI - resolved  Hyponatremia - resolved.    Stage 2 sacral ulcer noted 2x2 cms - Wound care consult  Discharge Diagnoses:  Active Problems:   Multiple myeloma (HCC)   DVT of leg (deep venous thrombosis) (HCC)   History of pulmonary embolism   Peripheral edema   SBO (small bowel obstruction)   Diabetes insipidus (HCC)   Thrombocytopenia (HCC)   Hypoalbuminemia due to protein-calorie malnutrition ([place])   Primary malignant neoplasm of lung metastatic to other site [facility])   Pressure injury of skin   Aspiration into airway   Goals of care, counseling/discussion   Palliative care by specialist    Discharge Instructions  Discharge Instructions    Diet - low sodium heart healthy    Complete by:  As directed      Allergies as of 07/29/2016      Reactions   Tramadol Palpitations   "Made my chest feel tight"   Amoxicillin Rash   Penicillins Rash   Tolerates Rocephin Jan 2018.  TDD. Has patient had a PCN reaction causing immediate rash, facial/tongue/throat swelling, SOB or lightheadedness with hypotension: unknown Has patient had a PCN reaction causing severe rash involving mucus membranes or skin necrosis: unknown Has patient had a PCN reaction that required hospitalization: no  Has patient had a PCN reaction occurring within the last 10 years: no  If all of the above answers are "NO", then may proceed with Cephalosporin use.      Medication List    STOP taking these medications   ALIGN 4 MG Caps    cholecalciferol 1000 units tablet Commonly known as:  VITAMIN D   fish oil-omega-3 fatty acids 1000 MG capsule   insulin aspart 100 UNIT/ML injection Commonly known as:  novoLOG   methocarbamol 500 MG tablet Commonly known as:  ROBAXIN   prochlorperazine 5 MG/ML injection Commonly known as:  COMPAZINE   rivaroxaban 20 MG Tabs tablet Commonly known as:  XARELTO   tamsulosin 0.4 MG Caps capsule Commonly known as:  FLOMAX   ZOMETA 4 MG/5ML injection Generic drug:  zolendronic acid     TAKE these medications   acetaminophen 325 MG tablet Commonly known as:  TYLENOL Take 2 tablets (650 mg total) by mouth every 6 (six) hours as needed for mild pain (or Fever >/= 101).   ascorbic acid 500 MG tablet Commonly known as:  VITAMIN C Take 500 mg by mouth 2 (two) times daily.   baclofen 10 MG tablet Commonly known as:  LIORESAL Take 0.5 tablets (5 mg total) by mouth 3 (three) times daily as needed for muscle spasms (hiccups.).   desmopressin 0.1 MG tablet Commonly known as:  DDAVP Take 1 tablet (0.1 mg total) by mouth at bedtime.   dexamethasone 4 MG tablet Commonly known as:  DECADRON Take 1 tablet (4 mg total) by mouth 3 (three) times daily. What changed:  when to take this   feeding supplement (ENSURE ENLIVE) Liqd Take 237 mLs by mouth 2 (two) times daily between meals.   furosemide 20 MG tablet Commonly known as:  LASIX Take 1 tablet (20 mg total) by mouth daily. What changed:  when to take this  reasons to take this   LORazepam 0.5 MG tablet Commonly known as:  ATIVAN Take 1 tablet (0.5 mg total) by mouth every 6 (six) hours as needed for anxiety (nausea).   midodrine 5 MG tablet Commonly known as:  PROAMATINE Take 3 tablets (15 mg total) by mouth 3 (three) times daily with meals.   multivitamin with minerals Tabs tablet Take 1 tablet by mouth at bedtime.   ondansetron 8 MG tablet Commonly known as:  ZOFRAN Take 1 tablet (8 mg total) by mouth every 8  (eight) hours as needed for nausea or vomiting.   oxyCODONE 5 MG immediate release tablet Commonly known as:  Oxy IR/ROXICODONE Take 1-2 tablets (5-10 mg total) by mouth every 4 (four) hours as needed for severe pain. What changed:  when to take this  reasons to take this   pantoprazole 40 MG tablet Commonly known as:  PROTONIX TAKE 1 TABLET BY MOUTH TWO  TIMES DAILY   prochlorperazine 10 MG tablet Commonly known as:  COMPAZINE Take 1 tablet (10 mg total) by mouth every 6 (six) hours as needed. What changed:  reasons to take this   temazepam 15 MG capsule Commonly known as:  RESTORIL Take 1 capsule (15 mg total) by mouth at bedtime as needed. What changed:  reasons to take this      Follow-up Information    [facility] Follow up.   Specialty:  Hospice and Palliative Medicine Why:  Home Hospice Contact information: [place] [id number] [phone]          Allergies  Allergen Reactions  . Tramadol Palpitations    "Made my chest feel tight"  . Amoxicillin Rash  . Penicillins Rash    Tolerates Rocephin Jan 2018.  TDD.  Has patient had a PCN reaction causing immediate rash, facial/tongue/throat swelling, SOB or lightheadedness with hypotension: unknown Has patient had a PCN reaction causing severe rash involving mucus membranes or skin necrosis: unknown Has patient had a PCN reaction that required hospitalization: no  Has patient had a PCN reaction occurring within the last 10 years: no  If all of the above answers are "NO", then may proceed with Cephalosporin use.     Consultations:  Surgery  Oncology  CCM  Palliative    Procedures/Studies: Ct Abdomen Pelvis Wo Contrast  Result Date: 06/29/2016 CLINICAL DATA:  76 year old male with stage IV adenocarcinoma of the lung with intracranial metastatic disease October 2017 undergoing chemotherapy. Recent diagnosis of recurrent small bowel obstruction. Pulmonary embolus on  blood thinner. Subsequent encounter. EXAM: CT ABDOMEN AND PELVIS WITHOUT CONTRAST TECHNIQUE: Multidetector CT imaging of the abdomen and pelvis was performed following the standard protocol without IV contrast. COMPARISON:  06/27/2016 plain film exam. 06/24/2016 CT scan of the chest, abdomen and pelvis. FINDINGS: Lower chest: Basilar atelectatic changes. Coronary artery calcifications. Hepatobiliary: Taking into account limitation by non contrast imaging, no worrisome mass. No calcified gallstones. Nodular area of enhancement gallbladder fundus noted on prior exam not appreciated on present exam. Pancreas: Taking into account limitation by non contrast imaging, no inflammation or mass. Spleen: Taking into account  limitation by non contrast imaging, no enlargement or mass. Adrenals/Urinary Tract: Taking into account limitation by non contrast imaging, no adrenal or renal mass or evidence hydronephrosis. Scarring left kidney. Stomach/Bowel: Persistently dilated fluid and gas filled small bowel loops to level of the right lower quadrant/ distal ilium with abrupt transition without mass identified and therefore may be related to stricture/adhesion. Interval development of abnormal thickening of the ascending colon and proximal descending colon with surrounding inflammation of fat planes suggesting colitis whether inflammatory infectious or ischemic. Vascular/Lymphatic: Atherosclerotic changes aorta with mild ectasia. Aneurysmal dilation common iliac arteries measuring 2.5 cm on the right and 2.3 cm on the left. No adenopathy. Reproductive: Slightly lobulated prostate gland with impression upon the bladder base. Decompressed urinary bladder with circumferential thickening anterior superior wall. This did not appear thickened on recent CT urinary bladder was distended. Other: Small amount of free fluid in the pelvis. Mild third spacing of fluid. No free intraperitoneal air. Musculoskeletal: Scoliosis and degenerative  changes with Schmorl's node deformities without new compression fracture or osseous destructive lesion. IMPRESSION: Persistently dilated fluid and gas filled small bowel loops to level of the right lower quadrant/ distal ilium with abrupt transition without mass identified and therefore may be related to stricture/adhesion. Interval development of abnormal thickening of the ascending colon and proximal descending colon with surrounding inflammation of fat planes suggesting colitis whether inflammatory infectious or ischemic. Small amount of free fluid in pelvis.  No free intraperitoneal air. Aneurysmal dilation common iliac arteries measuring 2.5 cm on the right and 2.3 cm on the left. Electronically Signed   By: Genia Del M.D.   On: 06/29/2016 17:03   Dg Chest 1 View  Result Date: 06/29/2016 CLINICAL DATA:  History of lung cancer, hypotension EXAM: CHEST 1 VIEW COMPARISON:  06/24/2016 FINDINGS: Cardiomediastinal silhouette is stable. Right IJ Port-A-Cath is unchanged in position. There is streaky mild interstitial prominence bilateral perihilar with slight improvement from prior exam. No segmental infiltrate. No convincing pulmonary edema. IMPRESSION: There is streaky mild interstitial prominence bilateral perihilar with slight improvement from prior exam. No segmental infiltrate. No convincing pulmonary edema. Stable right Port-A-Cath position. Electronically Signed   By: Lahoma Crocker M.D.   On: 06/29/2016 15:48   Dg Abd 1 View  Result Date: 07/06/2016 CLINICAL DATA:  Acute generalized abdominal pain. EXAM: ABDOMEN - 1 VIEW COMPARISON:  Radiographs of June 27, 2016. FINDINGS: Moderately dilated small bowel loops are now seen concerning for distal small bowel obstruction. IVC filter is noted. Phleboliths are noted in the pelvis. IMPRESSION: Moderately dilated small bowel loops are noted concerning for distal small bowel obstruction. Follow-up radiographs recommended. Electronically Signed   By:  Marijo Conception, M.D.   On: 07/06/2016 13:43   Ir Ivc Filter Plmt / S&i /img Guid/mod Sed  Result Date: 06/30/2016 INDICATION: 76 year old male with a history of prior DVT and pulmonary embolism currently on anticoagulation via Eliquis. Unfortunately, he developed bright red blood per rectum recently and CT imaging suggests colitis. Therefore, his anticoagulation has been stopped. Repeat bilateral lower extremity ultrasound demonstrates bilateral acute DVT. Therefore, he requires caval interruption for PE prophylaxis. Of note, he has a history of multiple myeloma end stage IV lung adenocarcinoma. EXAM: ULTRASOUND GUIDANCE FOR VASCULARACCESS IVC CATHETERIZATION AND VENOGRAM IVC FILTER INSERTION Interventional Radiologist:  Criselda Peaches, MD MEDICATIONS: None. ANESTHESIA/SEDATION: Fentanyl 50 mcg IV; Versed 2.5 mg IV Moderate Sedation Time:  11 minutes The patient was continuously monitored during the procedure by the interventional radiology nurse  under my direct supervision. FLUOROSCOPY TIME:  Fluoroscopy Time: 1 minutes 48 seconds (182 mGy). COMPLICATIONS: None immediate. PROCEDURE: Informed written consent was obtained from the patient after a thorough discussion of the procedural risks, benefits and alternatives. All questions were addressed. Maximal Sterile Barrier Technique was utilized including caps, mask, sterile gowns, sterile gloves, sterile drape, hand hygiene and skin antiseptic. A timeout was performed prior to the initiation of the procedure. Maximal barrier sterile technique utilized including caps, mask, sterile gowns, sterile gloves, large sterile drape, hand hygiene, and Betadine prep. Under sterile condition and local anesthesia, right internal jugular venous access was performed with ultrasound. An ultrasound image was saved and sent to PACS. Over a guidewire, the IVC filter delivery sheath and inner dilator were advanced into the IVC just above the IVC bifurcation. Contrast injection  was performed for an IVC venogram. Through the delivery sheath, a retrievable Denali IVC filter was deployed below the level of the renal veins and above the IVC bifurcation. Limited post deployment venacavagram was performed. The delivery sheath was removed and hemostasis was obtained with manual compression. A dressing was placed. The patient tolerated the procedure well without immediate post procedural complication. FINDINGS: The IVC is patent. No evidence of thrombus, stenosis, or occlusion. No variant venous anatomy. Successful placement of the IVC filter below the level of the renal veins. IMPRESSION: Successful ultrasound and fluoroscopically guided placement of an infrarenal retrievable IVC filter via right jugular approach. PLAN: This IVC filter is potentially retrievable. The patient will be assessed for filter retrieval by Interventional Radiology in approximately 8-12 weeks. Further recommendations regarding filter retrieval, continued surveillance or declaration of device permanence, will be made at that time. Signed, Criselda Peaches, MD [facility] Radiology Electronically Signed   By: Jacqulynn Cadet M.D.   On: 06/30/2016 16:59   Dg Chest Port 1 View  Result Date: 07/19/2016 CLINICAL DATA:  Known small bowel obstruction EXAM: PORTABLE CHEST 1 VIEW COMPARISON:  07/18/2016 FINDINGS: Cardiac shadow is stable. Nasogastric catheter is noted coiled within the stomach. A right chest wall port is again seen and stable. Patchy densities are again identified bilaterally but overall improved when compare with the prior exam. No sizable effusion is seen. No acute bony abnormality is noted. IMPRESSION: Continued improved aeration bilaterally. Some patchy residual changes are again seen. Electronically Signed   By: Inez Catalina M.D.   On: 07/19/2016 09:49   Dg Chest Port 1 View  Result Date: 07/18/2016 CLINICAL DATA:  Pneumonia EXAM: PORTABLE CHEST 1  VIEW COMPARISON:  07/16/2016 FINDINGS: Nasogastric tube enters the stomach. Power port unchanged with its tip at the SVC RA junction. There is less pulmonary density on the left and slightly worsened pulmonary density on the right. This rapid change suggests possible edema. No lobar collapse or effusion. IMPRESSION: Left lung looks much better today. Slightly worsened density now on the right. These rapid changes are more typically seen with edema rather than bronchopneumonia. Electronically Signed   By: Nelson Chimes M.D.   On: 07/18/2016 07:48   Dg Chest Port 1 View  Result Date: 07/16/2016 CLINICAL DATA:  Respiratory failure EXAM: PORTABLE CHEST 1 VIEW COMPARISON:  07/13/2016 FINDINGS: Cardiac shadow is mildly enlarged. Right chest wall port is again seen and stable. Nasogastric catheter is now noted coiled within the stomach. Inspiratory effort is poor with diffuse increased density throughout the left lung new from the prior exam. No pneumothorax is noted. No sizable effusion is seen. IMPRESSION: Increasing  infiltrative density in the left lung. Continued follow-up is recommended. Nasogastric catheter within the stomach. Electronically Signed   By: Inez Catalina M.D.   On: 07/16/2016 08:16   Dg Chest Port 1 View  Result Date: 07/13/2016 CLINICAL DATA:  Possible aspiration 45 minutes ago. Respiratory distress. EXAM: PORTABLE CHEST 1 VIEW COMPARISON:  Single-view of the chest 07/07/2016. FINDINGS: Lung volumes are low. Small focus of discoid atelectasis is seen in the left mid lung zone. The right lung is clear. Small amount of fluid or thickening of the minor fissure noted. No pneumothorax or pleural effusion. Heart size is upper normal. Port-A-Cath is in place. IMPRESSION: No acute disease. Airspace opacity right in the lung base seen on the comparison exam has resolved. Electronically Signed   By: Inge Rise M.D.   On: 07/13/2016 19:29   Dg Chest Port 1 View  Result Date: 07/07/2016 CLINICAL  DATA:  Respiratory failure EXAM: PORTABLE CHEST 1 VIEW COMPARISON:  July 06, 2016 FINDINGS: Port-A-Cath tip is at the cavoatrial junction. No pneumothorax. There is stable patchy airspace opacity in the right lower lung zone and left mid lung region. Lungs elsewhere clear. Heart is upper normal in size with pulmonary vascularity within normal limits. There is atherosclerotic calcification aorta. No adenopathy evident. Bones appear somewhat osteoporotic. IMPRESSION: Patchy infiltrate in the right lower lung zone and left mid lung regions, stable. No pneumothorax. Stable cardiac silhouette. There is aortic atherosclerosis. Electronically Signed   By: Lowella Grip III M.D.   On: 07/07/2016 07:45   Dg Chest Port 1 View  Result Date: 07/06/2016 CLINICAL DATA:  Respiratory failure. EXAM: PORTABLE CHEST 1 VIEW COMPARISON:  07/01/2016. FINDINGS: PowerPort catheter noted with tip projected over right atrium in stable position. Stable cardiomegaly. Partial clearing of bilateral upper lobe infiltrates. Progression of right base atelectasis and infiltrate. Small right pleural effusion. No pneumothorax. Questionable bowel distention noted. No free air noted. IMPRESSION: 1. PowerPort catheter noted in stable position . 2. Partial clearing of bilateral upper lobe infiltrates. Progression of right base atelectasis and infiltrate. Small right pleural effusion. 3. Questionable bowel distention. Abdominal series can be obtained if needed. Electronically Signed   By: Marcello Moores  Register   On: 07/06/2016 06:45   Dg Chest Port 1 View  Result Date: 07/01/2016 CLINICAL DATA:  Pneumonia. EXAM: PORTABLE CHEST 1 VIEW COMPARISON:  06/29/2016. FINDINGS: PowerPort catheter noted with lead tip projected over the right atrium. Heart size stable. Bilateral upper lobe and right base infiltrates again noted. Slight progression from prior exam. No prominent pleural effusion or pneumothorax. IMPRESSION: 1. PowerPort catheter in stable  position . 2. Bilateral upper lobe and right base infiltrates. Slight progression from prior exam Electronically Signed   By: Marcello Moores  Register   On: 07/01/2016 06:51   Dg Abd 2 Views  Result Date: 07/08/2016 CLINICAL DATA:  Abdominal pain, nausea, vomiting. EXAM: ABDOMEN - 2 VIEW COMPARISON:  None. FINDINGS: Dilated small bowel loops are again noted consistent with distal small bowel obstruction. IVC filter is noted. Phleboliths are noted in the pelvis. No free air is noted suggest pneumoperitoneum. IMPRESSION: Stable small bowel dilatation is noted consistent with distal small bowel obstruction. Electronically Signed   By: Marijo Conception, M.D.   On: 07/08/2016 18:30   Dg Abd Portable 1v  Result Date: 07/18/2016 CLINICAL DATA:  Small-bowel obstruction. EXAM: PORTABLE ABDOMEN - 1 VIEW COMPARISON:  07/15/2016. FINDINGS: NG tube noted coiled in stomach. IVC filter noted in stable position. Continued improvement of small bowel  distention. No free air. Pelvic calcifications consistent phleboliths. Degenerative changes scoliosis lumbar spine. Degenerative changes both hips. IMPRESSION: NG tube noted coiled stomach. Continued interim improvement of small bowel distention Electronically Signed   By: Marcello Moores  Register   On: 07/18/2016 07:47   Dg Abd Portable 1v-small Bowel Obstruction Protocol-initial, 8 Hr Delay  Result Date: 07/15/2016 CLINICAL DATA:  Small bowel obstruction, follow-up evaluation. EXAM: PORTABLE ABDOMEN - 1 VIEW COMPARISON:  Abdominal radiograph September 10, 2016 FINDINGS: Multiple loops of gas-filled small bowel measure up to 3.1 cm, decreased from prior examination. Small amount of large bowel gas. No intra- abdominal mass effect or pathologic calcifications. Inferior vena cava filter projects of the RIGHT at L2-3. Nasogastric tube looped in proximal stomach. Mild vascular calcifications. Degenerative change of the lumbar spine. IMPRESSION: Resolving small bowel obstruction, nasogastric tube in  proximal stomach. Electronically Signed   By: Elon Alas M.D.   On: 07/15/2016 03:20   Dg Abd Portable 1v  Result Date: 07/13/2016 CLINICAL DATA:  Distended abdomen.  Follow-up. EXAM: PORTABLE ABDOMEN - 1 VIEW COMPARISON:  07/08/2016 FINDINGS: Dilated small bowel loops again noted compatible small-bowel or obstruction. There appears to be mild progression of small bowel dilatation. Stomach also is dilated. Colon is decompressed. IMPRESSION: Small-bowel obstruction pattern with mild progression since prior study. Stones also is significantly distended. Electronically Signed   By: Franchot Gallo M.D.   On: 07/13/2016 15:37   Dg Femur Port, [place] 2 Views Right  Result Date: 07/14/2016 CLINICAL DATA:  Pain distal femur. History of multiple myeloma and lung carcinoma EXAM: RIGHT FEMUR PORTABLE 2 VIEW COMPARISON:  None. FINDINGS: Portions of the proximal femur are not visualized. Frontal and lateral views of the mid and distal portions of the femur obtained. There is a total knee replacement with prosthetic components well-seated. No fracture or dislocation. No knee joint effusion. In regions which are visualized, there is no abnormal periosteal reaction. No blastic or lytic bone lesions. IMPRESSION: In visualized portions of the right femur, there is no blastic or lytic bone lesion. No abnormal periosteal reaction. No fracture or dislocation. No knee joint effusion. Total knee replacement prosthetic components appear well seated. Electronically Signed   By: Lowella Grip III M.D.   On: 07/14/2016 16:03       Subjective: He is feeling ok, needs pain medication for abdominal pain. He has been tolerating diet   Discharge Exam: Vitals:   07/28/16 2345 07/29/16 0801  BP: 99/71 (!) 87/68  Pulse:  83  Resp:    Temp: 98.2 F (36.8 C) 98.5 F (36.9 C)   Vitals:   07/28/16 1249 07/28/16 2345 07/29/16 0541 07/29/16 0801  BP: 95/61 99/71  (!) 87/68  Pulse: 67   83  Resp: 15     Temp: 97.6 F  (36.4 C) 98.2 F (36.8 C)  98.5 F (36.9 C)  TempSrc: Oral Oral  Oral  SpO2: 97%   96%  Weight:   (!) 196 kg (432 lb 1.6 oz)   Height:        General: Pt is alert, awake, not in acute distress Cardiovascular: RRR, S1/S2 +, no rubs, no gallops Respiratory: CTA bilaterally, no wheezing, no rhonchi Abdominal: Soft, NT, ND, bowel sounds + Extremities: no edema, no cyanosis    The results of significant diagnostics from this hospitalization (including imaging, microbiology, ancillary and laboratory) are listed below for reference.     Microbiology: Recent Results (from the past 240 hour(s))  MRSA PCR Screening  Status: None   Collection Time: 07/28/16  5:40 AM  Result Value Ref Range Status   MRSA by PCR NEGATIVE NEGATIVE Final    Comment:        The GeneXpert MRSA Assay (FDA approved for NASAL specimens only), is one component of a comprehensive MRSA colonization surveillance program. It is not intended to diagnose MRSA infection nor to guide or monitor treatment for MRSA infections.      Labs: BNP (last 3 results)  Recent Labs  06/05/16 2354 06/19/16 1210 06/24/16 2151  BNP 45.8 33.6 25.3   Basic Metabolic Panel:  Recent Labs Lab 07/24/16 0256 07/25/16 0331 07/27/16 0357 07/28/16 0237 07/29/16 0831  NA 137 140 141 139 138  K 4.0 3.9 3.5 3.0* 3.4*  CL 108 108 106 104 101  CO2 '24 25 27 28 28  '$ GLUCOSE 140* 109* 142* 116* 125*  BUN 28* 25* '19 16 18  '$ CREATININE 0.55* 0.62 0.68 0.59* 0.71  CALCIUM 7.8* 8.0* 7.8* 7.7* 8.0*   Liver Function Tests:  Recent Labs Lab 07/24/16 0256 07/25/16 0331 07/27/16 0357  AST '16 18 17  '$ ALT '20 24 25  '$ ALKPHOS 86 99 105  BILITOT 0.5 0.5 0.5  PROT 3.9* 3.8* 3.8*  ALBUMIN 1.4* 1.4* 1.9*   No results for input(s): LIPASE, AMYLASE in the last 168 hours. No results for input(s): AMMONIA in the last 168 hours. CBC:  Recent Labs Lab 07/25/16 0331 07/26/16 0445 07/27/16 0357 07/28/16 0237 07/29/16 0831  WBC  12.6* 10.8* 9.3 9.3 12.3*  HGB 7.3* 8.0* 7.0* 9.6* 11.2*  HCT 22.9* 24.8* 21.9* 29.3* 34.4*  MCV 95.4 94.3 96.5 92.4 93.0  PLT 52* 48* 46* 42* 43*   Cardiac Enzymes: No results for input(s): CKTOTAL, CKMB, CKMBINDEX, TROPONINI in the last 168 hours. BNP: Invalid input(s): POCBNP CBG:  Recent Labs Lab 07/27/16 1217 07/27/16 1700 07/27/16 2130 07/28/16 0836 07/28/16 1247  GLUCAP 98 74 106* 145* 142*   D-Dimer No results for input(s): DDIMER in the last 72 hours. Hgb A1c No results for input(s): HGBA1C in the last 72 hours. Lipid Profile No results for input(s): CHOL, HDL, LDLCALC, TRIG, CHOLHDL, LDLDIRECT in the last 72 hours. Thyroid function studies No results for input(s): TSH, T4TOTAL, T3FREE, THYROIDAB in the last 72 hours.  Invalid input(s): FREET3 Anemia work up No results for input(s): VITAMINB12, FOLATE, FERRITIN, TIBC, IRON, RETICCTPCT in the last 72 hours. Urinalysis    Component Value Date/Time   COLORURINE YELLOW 06/29/2016 2121   APPEARANCEUR CLEAR 06/29/2016 2121   LABSPEC 1.014 06/29/2016 2121   LABSPEC 1.005 05/09/2016 0937   PHURINE 7.0 06/29/2016 2121   GLUCOSEU NEGATIVE 06/29/2016 2121   GLUCOSEU Negative 05/09/2016 0937   HGBUR NEGATIVE 06/29/2016 2121   BILIRUBINUR NEGATIVE 06/29/2016 2121   [place]gram Negative 05/09/2016 [place] 06/29/2016 2121   PROTEINUR NEGATIVE 06/29/2016 2121   UROBILINOGEN 0.2 05/09/2016 0937   NITRITE NEGATIVE 06/29/2016 2121   LEUKOCYTESUR NEGATIVE 06/29/2016 2121   LEUKOCYTESUR Negative 05/09/2016 0937   Sepsis Labs Invalid input(s): PROCALCITONIN,  WBC,  LACTICIDVEN Microbiology Recent Results (from the past 240 hour(s))  MRSA PCR Screening     Status: None   Collection Time: 07/28/16  5:40 AM  Result Value Ref Range Status   MRSA by PCR NEGATIVE NEGATIVE Final    Comment:        The GeneXpert MRSA Assay (FDA approved for NASAL specimens only), is one component of a comprehensive MRSA  colonization surveillance program.  It is not intended to diagnose MRSA infection nor to guide or monitor treatment for MRSA infections.      Time coordinating discharge: Over 30 minutes  SIGNED:   Elmarie Shiley, MD  [facility] 07/29/2016, 10:09 AM Pager   If 7PM-7AM, please contact night-coverage [email or web address] Password [email or web address]

## 2016-07-29 NOTE — Clinical Social Work Note (Signed)
Per CSW handoff, patient will discharge home with hospice services.  CSW signing off. Consult again if any other social work needs arise.  Dayton Scrape, [place]

## 2016-07-29 NOTE — Care Management Important Message (Signed)
Important Message  Patient Details  Name: [name] MRN: [id number] Date of Birth: 1941-01-05   Medicare Important Message Given:  Yes    Ariela Mochizuki Abena 07/29/2016, 5:15 PM

## 2016-08-01 ENCOUNTER — Telehealth: Payer: Self-pay | Admitting: Medical Oncology

## 2016-08-01 DIAGNOSIS — R062 Wheezing: Secondary | ICD-10-CM

## 2016-08-01 MED ORDER — ALBUTEROL SULFATE (2.5 MG/3ML) 0.083% IN NEBU: 2.5000 mg | INHALATION_SOLUTION | Freq: Four times a day (QID) | RESPIRATORY_TRACT | 12 refills | Status: AC | PRN

## 2016-08-01 NOTE — Telephone Encounter (Signed)
[facility] requests nebulizer with albuterol. Ordered approved per mohamed.

## 2016-08-04 ENCOUNTER — Telehealth: Payer: Self-pay | Admitting: *Deleted

## 2016-08-04 NOTE — Telephone Encounter (Signed)
Hospice RN called states " Pt having urinary retention Pt had bowel movement 1/24, and urine bag was dry. Pt states he was not able to urinate. In and out cath pt 1014m drained and pt still wet bed after.  Pt wife was instructed to call hospice by 8pm if pt has not voided. Pt currently has condom cath.  RN advised pt has discoloration to hands, pt states he has woken up 2x thought he was dead, pt has stated several times " he's right with everything" RN also requested to stop desmopressin. Pt is still accessed. Can PAC be de accessed."   Informed RN MD out of office and I will give concerns to him upon his return. All return call information to be called back to [phone].

## 2016-08-05 NOTE — Telephone Encounter (Signed)
Per MD, called Shirlean Mylar , RN with [facility]  with verbal order to deaccess pt's PAC, and verbal order to stop desmopressin.

## 2016-08-08 ENCOUNTER — Other Ambulatory Visit: Payer: Self-pay | Admitting: Medical Oncology

## 2016-08-08 ENCOUNTER — Telehealth: Payer: Self-pay

## 2016-08-08 ENCOUNTER — Other Ambulatory Visit: Payer: Self-pay | Admitting: *Deleted

## 2016-08-08 MED ORDER — OXYCODONE HCL 5 MG PO TABS: 5.0000 mg | ORAL_TABLET | ORAL | 0 refills | Status: AC | PRN

## 2016-08-08 MED ORDER — OXYCODONE HCL 5 MG PO TABS: 5.0000 mg | ORAL_TABLET | ORAL | 0 refills | Status: DC | PRN

## 2016-08-08 NOTE — Telephone Encounter (Signed)
oxyir Faxed to [facility]

## 2016-08-08 NOTE — Telephone Encounter (Signed)
S/w Dr [name] and rx for #[phone] prepared for signature and placed on Dr's desk

## 2016-08-08 NOTE — Telephone Encounter (Signed)
Ivey RN with hospice called in requesting refill on oxycodone 5 mg. Requested it be faxed in to [facility] on [address]. The last refill was 07/29/16 for quantity of 30. Forwarded to Dr [name] to inquire if we need to increase quantity.

## 2016-08-09 ENCOUNTER — Telehealth: Payer: Self-pay | Admitting: Medical Oncology

## 2016-08-09 ENCOUNTER — Other Ambulatory Visit: Payer: Self-pay | Admitting: Medical Oncology

## 2016-08-09 ENCOUNTER — Ambulatory Visit: Payer: Self-pay | Admitting: Internal Medicine

## 2016-08-09 ENCOUNTER — Telehealth: Payer: Self-pay | Admitting: *Deleted

## 2016-08-09 ENCOUNTER — Other Ambulatory Visit: Payer: Self-pay

## 2016-08-09 ENCOUNTER — Ambulatory Visit: Payer: Self-pay

## 2016-08-09 DIAGNOSIS — F411 Generalized anxiety disorder: Secondary | ICD-10-CM

## 2016-08-09 DIAGNOSIS — C7931 Secondary malignant neoplasm of brain: Secondary | ICD-10-CM

## 2016-08-09 DIAGNOSIS — G893 Neoplasm related pain (acute) (chronic): Secondary | ICD-10-CM

## 2016-08-09 MED ORDER — MORPHINE SULFATE (CONCENTRATE) 10 MG /0.5 ML PO SOLN: 10.0000 mg | ORAL | 0 refills | Status: AC | PRN

## 2016-08-09 MED ORDER — LORAZEPAM 0.5 MG PO TABS: 1.0000 mg | ORAL_TABLET | Freq: Four times a day (QID) | ORAL | 0 refills | Status: AC | PRN

## 2016-08-09 MED ORDER — LORAZEPAM 0.5 MG PO TABS: 0.5000 mg | ORAL_TABLET | Freq: Four times a day (QID) | ORAL | 0 refills | Status: DC | PRN

## 2016-08-09 MED ORDER — LIDOCAINE-PRILOCAINE 2.5-2.5 % EX CREA: 1.0000 "application " | TOPICAL_CREAM | CUTANEOUS | 0 refills | Status: AC | PRN

## 2016-08-09 NOTE — Telephone Encounter (Signed)
Hospice called to request increase in ativan from 0.5 mg every 6 hours prn to 1.0 mg every 6 hours prn. Called order increase to pharmacy.

## 2016-08-09 NOTE — Telephone Encounter (Signed)
"  [facility] calling to report this patient is having shortness of breath.  He is transitioning to end of life.  The albuterol nebulizer is providing no relief.  He has increased anxiety.  Family has elevated his head.  Could we have an order for topical lidocaine, low dose methadone for shortness of breath.  Hospice has tried this for comfort measures on other patients.  He also could use an increase of the lorazepam 0.5 mg q 6 hrs.  If Dr. [name] feels these orders are appropriate, fax them to [facility] on [address]    I would like a return call today [phone]."

## 2016-08-09 NOTE — Telephone Encounter (Signed)
Morphine and lorazepam and emla cream refill rx faxed to pharmacy and [name] notified.

## 2016-08-09 NOTE — Telephone Encounter (Signed)
Note to mohamed 

## 2016-08-16 ENCOUNTER — Telehealth: Payer: Self-pay

## 2016-08-16 ENCOUNTER — Telehealth: Payer: Self-pay | Admitting: Family Medicine

## 2016-08-17 ENCOUNTER — Telehealth: Payer: Self-pay | Admitting: Family Medicine

## 2016-08-17 NOTE — Telephone Encounter (Signed)
Sympathy card sent

## 2016-08-25 NOTE — Progress Notes (Signed)
Addendum to note: IV RN changing TNA, called to room, pt noted difficultly breathing, rhonchi noted, audible congestion, pt  Pale in color.  O2 started 2L via n/c SAT low 80's. Called respiratory, re-breather placed, pt continued to have SATs below 90, tachycardic, called rapid response while respiratory provided chest therapy. Called on-call, informed of pt status, Orders received to transfer pt off floor, called report, and transferred pt via bed to ICU

## 2016-09-08 NOTE — Telephone Encounter (Signed)
[name] @ Hospice called to let Dr Tomi Bamberger know that pt passed away at 2:28 pm at his home

## 2016-09-08 NOTE — Telephone Encounter (Signed)
[name] with hospice called to let us know pt died today at 3 at home.

## 2016-09-08 DEATH — deceased

## 2016-10-21 ENCOUNTER — Other Ambulatory Visit: Payer: Self-pay | Admitting: Nurse Practitioner

## 2016-12-09 NOTE — Addendum Note (Signed)
Addendum  created 12/09/16 [id number] by Rica Koyanagi, MD   Sign clinical note

## 2016-12-09 NOTE — Addendum Note (Signed)
Addendum  created 12/09/16 [id number] by Rica Koyanagi, MD   Sign clinical note

## 2017-03-01 ENCOUNTER — Ambulatory Visit: Payer: Self-pay | Admitting: Family Medicine

## 2017-07-20 IMAGING — CT CT ABD-PELV W/O CM
2 of 3 series · 15 of 46 positions shown, 17 images · IV contrast (APPLIED)
Comparison: 06/27/2016 plain film exam. 06/24/2016 CT scan of the
chest, abdomen and pelvis.

CLINICAL DATA: 75-year-old male with stage IV adenocarcinoma of the
lung with intracranial metastatic disease April 2016 undergoing
chemotherapy. Recent diagnosis of recurrent small bowel obstruction.
Pulmonary embolus on blood thinner. Subsequent encounter.

EXAM:
CT ABDOMEN AND PELVIS WITHOUT CONTRAST
TECHNIQUE: Multidetector CT imaging of the abdomen and pelvis was performed
following the standard protocol without IV contrast.

[Series 3: axial st · axial · 0.98mm/px · z∈[+993,+1403]mm · 12 of 96 slices shown, 14 images]
[im 7/96  soft-tissue]
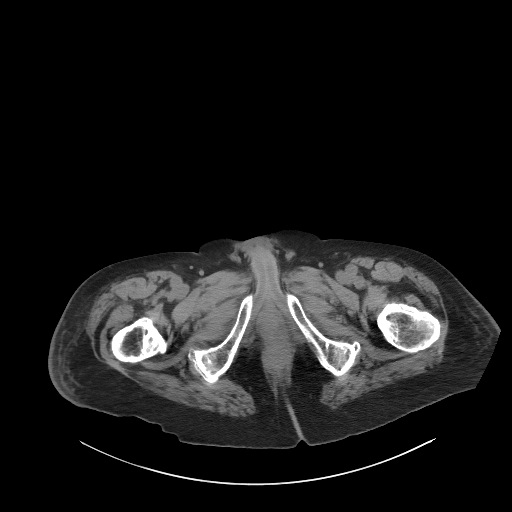
[im 7/96  bone]
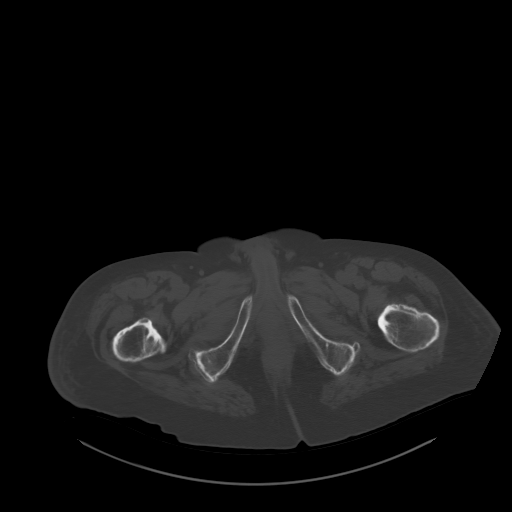
[im 13/96  soft-tissue]
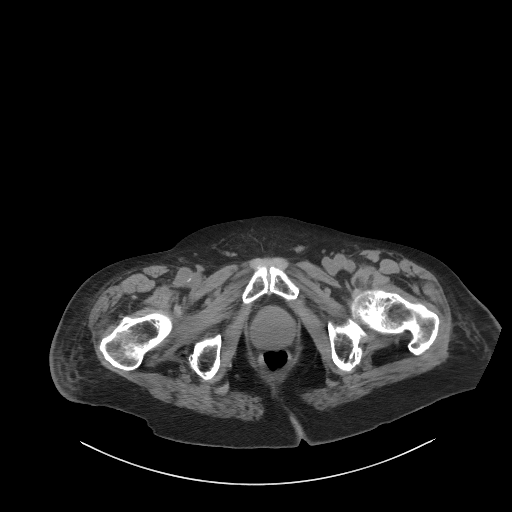
[im 22/96  soft-tissue]
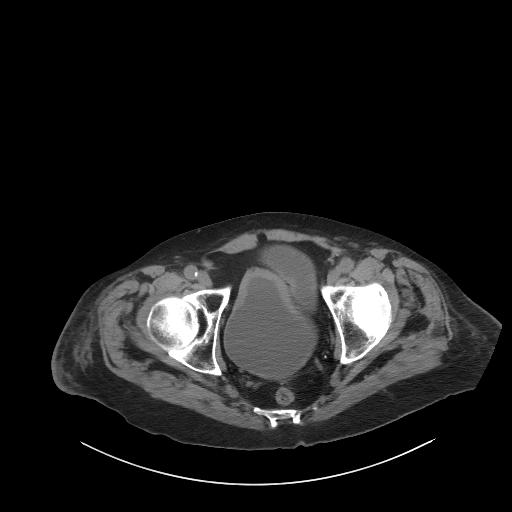
[im 28/96  soft-tissue]
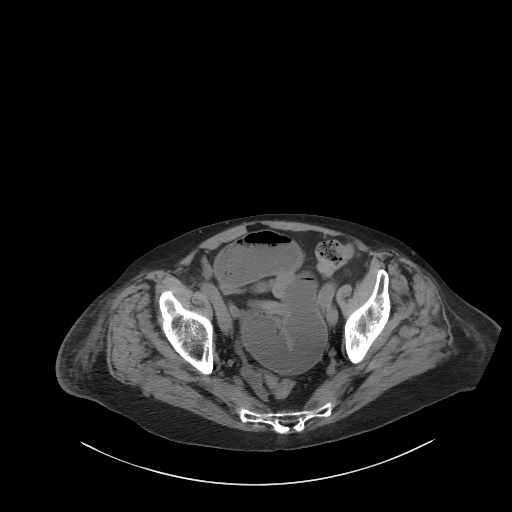
[im 37/96  soft-tissue]
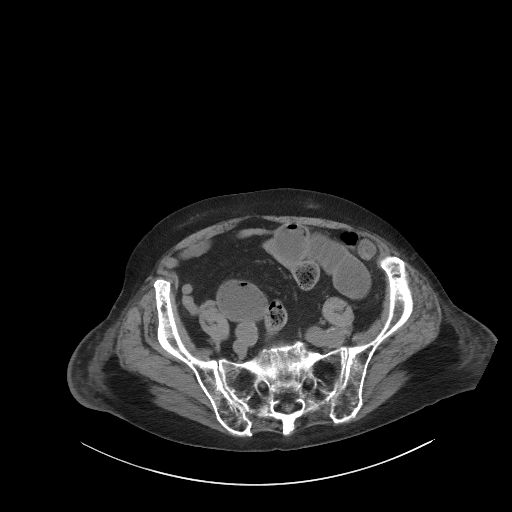
[im 43/96  soft-tissue]
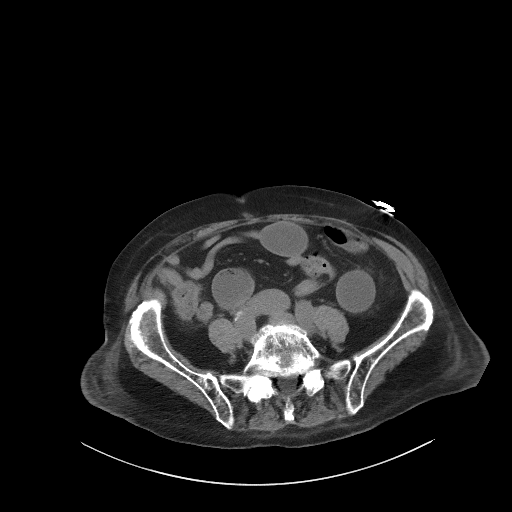
[im 53/96  soft-tissue]
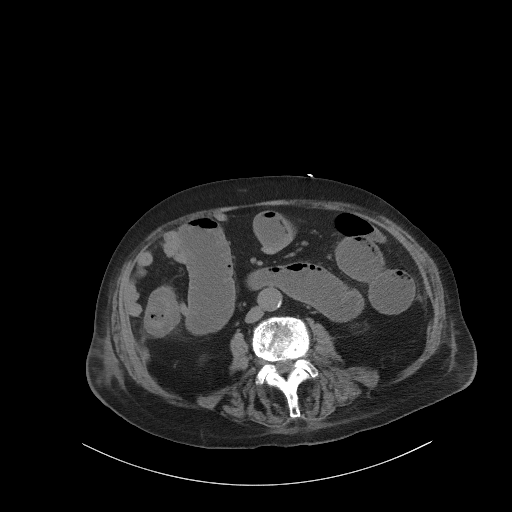
[im 59/96  soft-tissue]
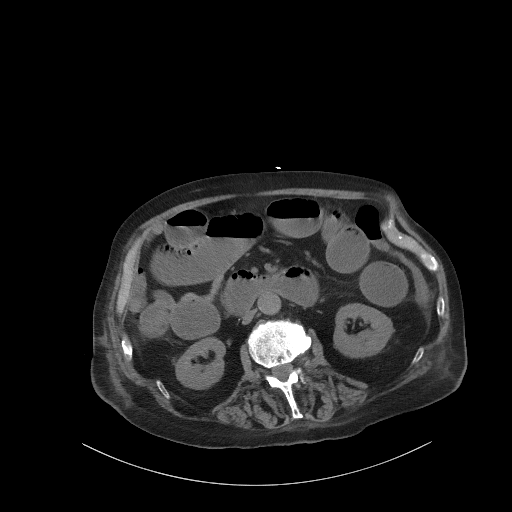
[im 68/96  soft-tissue]
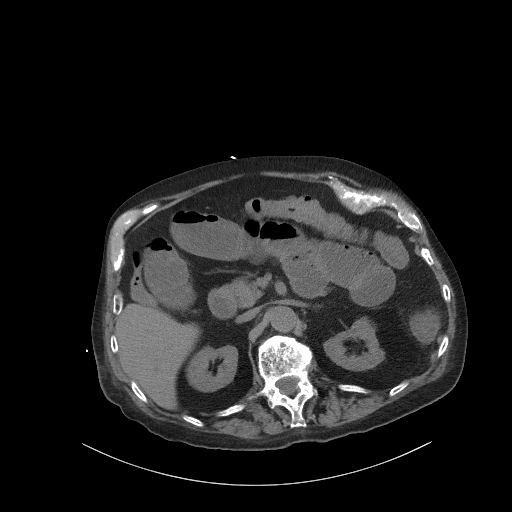
[im 68/96  bone]
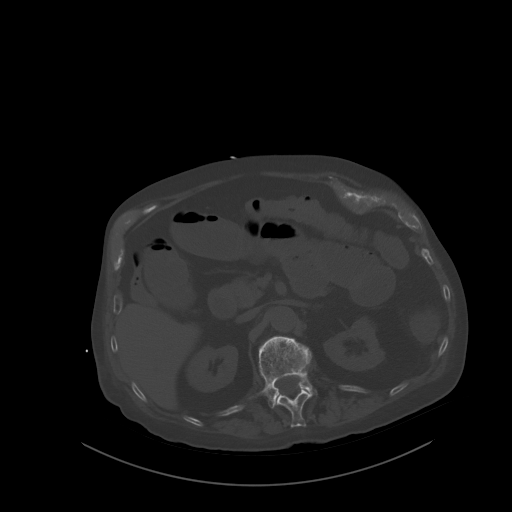
[im 74/96  soft-tissue]
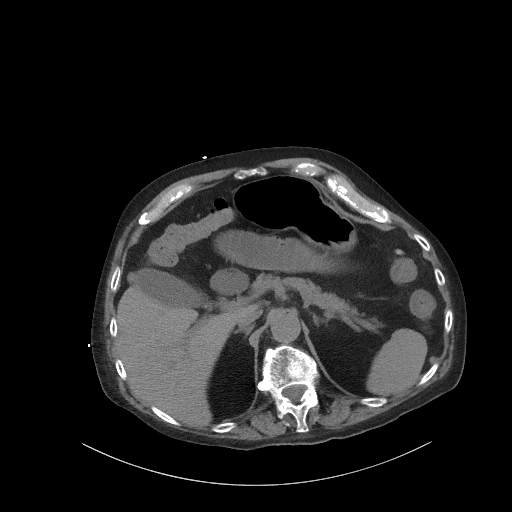
[im 83/96  soft-tissue]
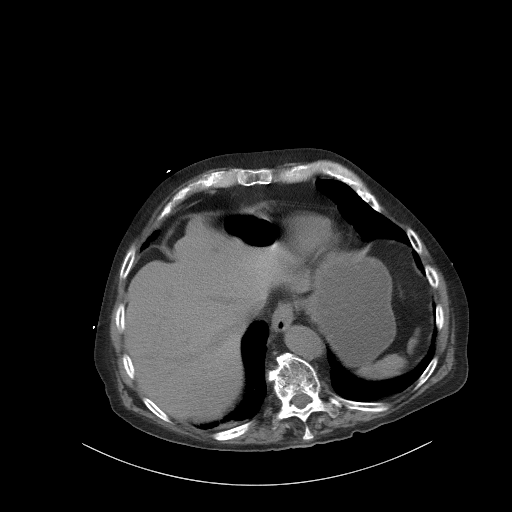
[im 89/96  soft-tissue]
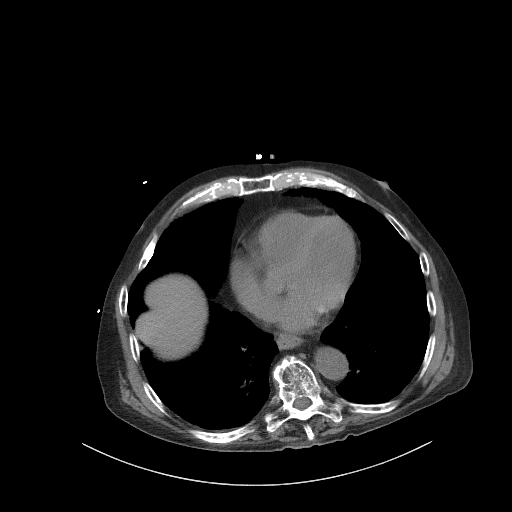

[Series 5: coronal st · coronal · 0.69mm/px · 3 of 110 slices shown]
[im 37/110  soft-tissue]
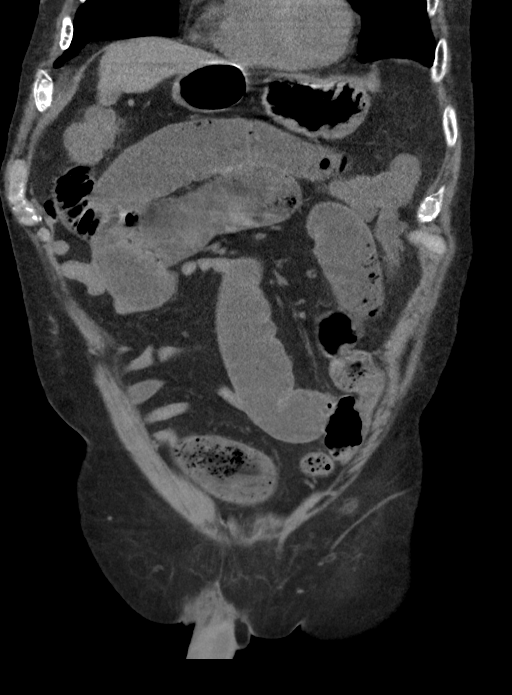
[im 49/110  soft-tissue]
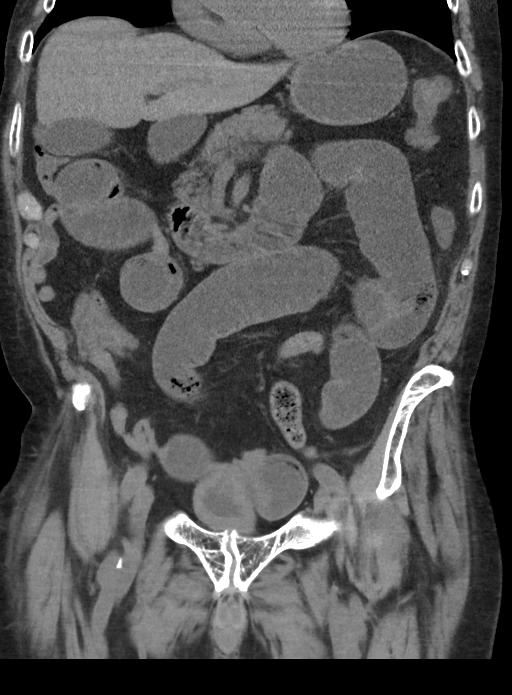
[im 61/110  soft-tissue]
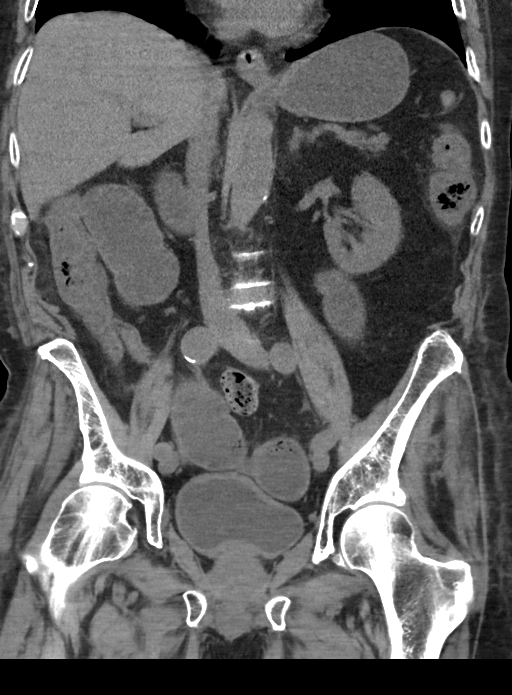

[15 of 46 positions shown; findings below may reference images not displayed]

FINDINGS: Lower chest: Basilar atelectatic changes. Coronary artery
calcifications.

Hepatobiliary: Taking into account limitation by non contrast
imaging, no worrisome mass. No calcified gallstones. Nodular area of
enhancement gallbladder fundus noted on prior exam not appreciated
on present exam.

Pancreas: Taking into account limitation by non contrast imaging, no
inflammation or mass.

Spleen: Taking into account limitation by non contrast imaging, no
enlargement or mass.

Adrenals/Urinary Tract: Taking into account limitation by non
contrast imaging, no adrenal or renal mass or evidence
hydronephrosis. Scarring left kidney.

Stomach/Bowel: Persistently dilated fluid and gas filled small bowel
loops to level of the right lower quadrant/ distal ilium with abrupt
transition without mass identified and therefore may be related to
stricture/adhesion.

Interval development of abnormal thickening of the ascending colon
and proximal descending colon with surrounding inflammation of fat
planes suggesting colitis whether inflammatory infectious or
ischemic.

Vascular/Lymphatic: Atherosclerotic changes aorta with mild ectasia.
Aneurysmal dilation common iliac arteries measuring 2.5 cm on the
right and 2.3 cm on the left. No adenopathy.

Reproductive: Slightly lobulated prostate gland with impression upon
the bladder base. Decompressed urinary bladder with circumferential
thickening anterior superior wall. This did not appear thickened on
recent CT urinary bladder was distended.

Other: Small amount of free fluid in the pelvis. Mild third spacing
of fluid. No free intraperitoneal air.

Musculoskeletal: Scoliosis and degenerative changes with Schmorl's
node deformities without new compression fracture or osseous
destructive lesion.
IMPRESSION: Persistently dilated fluid and gas filled small bowel loops to level
of the right lower quadrant/ distal ilium with abrupt transition
without mass identified and therefore may be related to
stricture/adhesion.

Interval development of abnormal thickening of the ascending colon
and proximal descending colon with surrounding inflammation of fat
planes suggesting colitis whether inflammatory infectious or
ischemic.

Small amount of free fluid in pelvis.  No free intraperitoneal air.

Aneurysmal dilation common iliac arteries measuring 2.5 cm on the
right and 2.3 cm on the left.

## 2017-08-03 IMAGING — DX DG ABD PORTABLE 1V
1 series · 1 of 1 positions shown · non-contrast
Comparison: 07/08/2016

CLINICAL DATA: Distended abdomen.  Follow-up.

EXAM:
PORTABLE ABDOMEN - 1 VIEW

[abdomen supine]
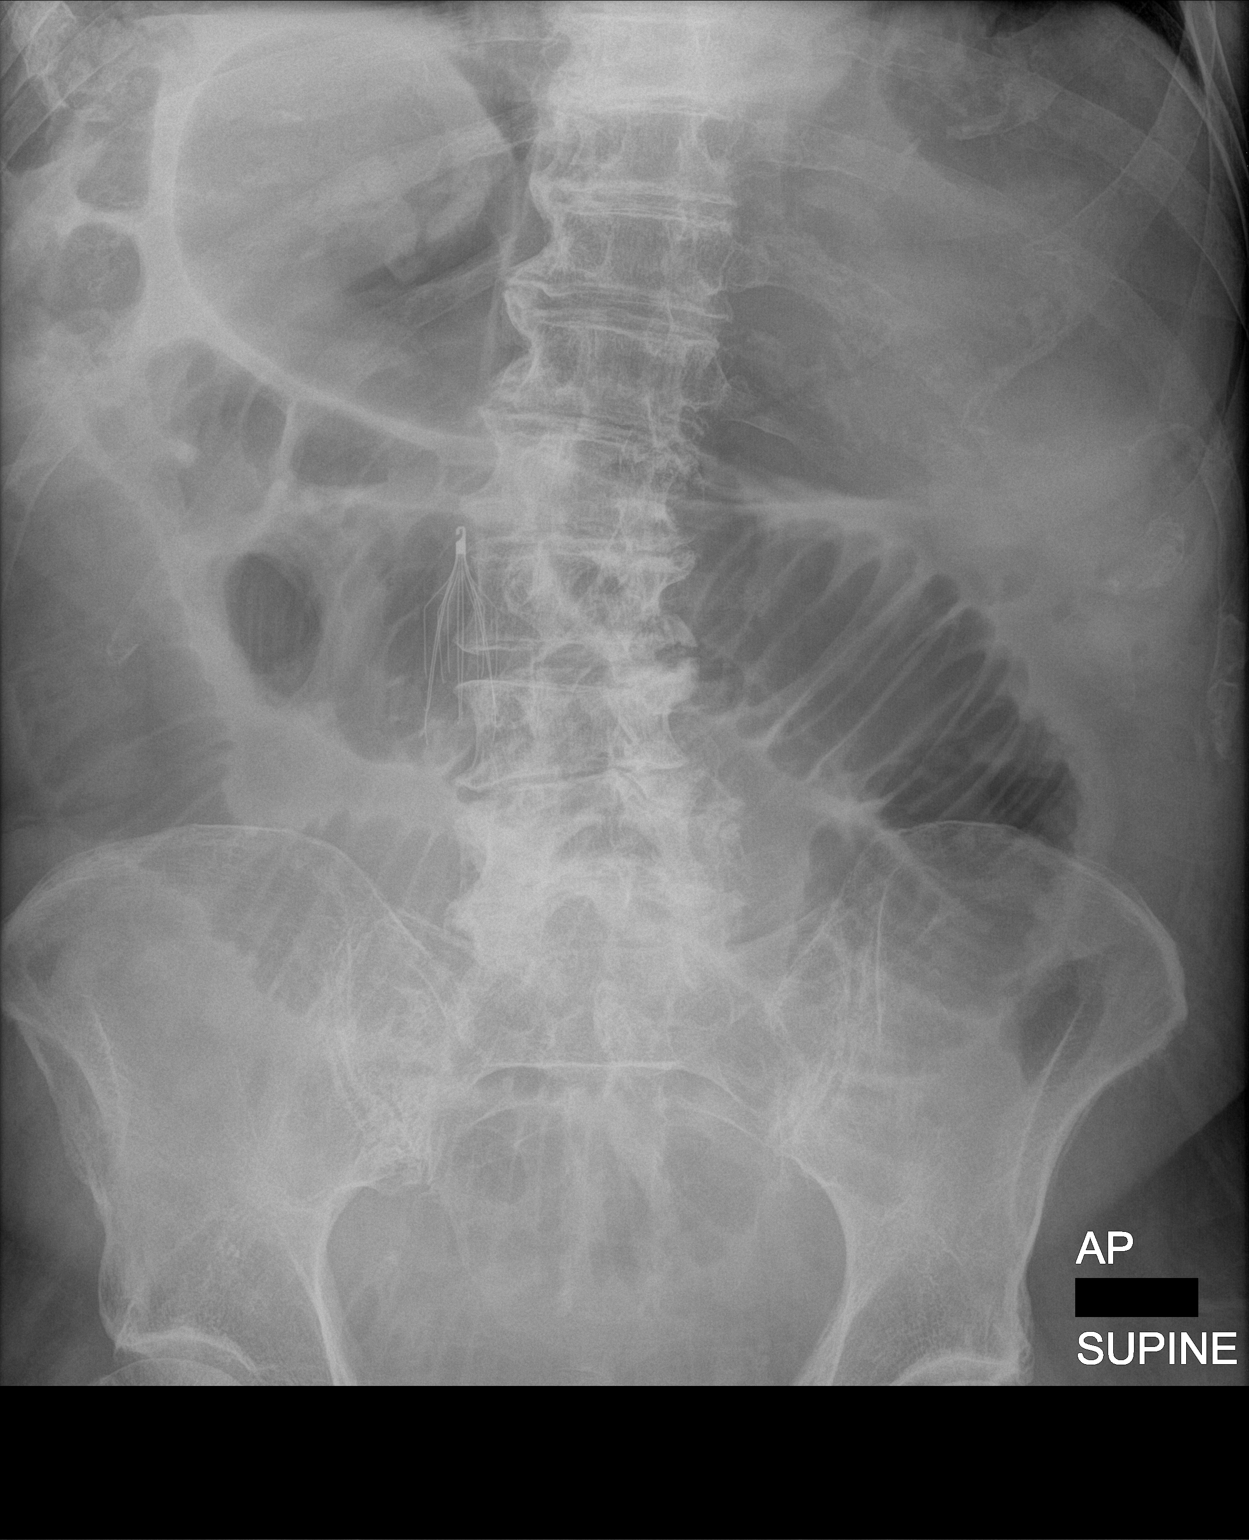

[1 of 1 positions shown; findings below may reference images not displayed]

FINDINGS: Dilated small bowel loops again noted compatible small-bowel or
obstruction. There appears to be mild progression of small bowel
dilatation. Stomach also is dilated. Colon is decompressed.
IMPRESSION: Small-bowel obstruction pattern with mild progression since prior
study. Stones also is significantly distended.

## 2017-08-03 IMAGING — CR DG CHEST 1V PORT
1 series · 1 of 1 positions shown · non-contrast
Comparison: Single-view of the chest 07/07/2016.

CLINICAL DATA: Possible aspiration 45 minutes ago. Respiratory
distress.

EXAM:
PORTABLE CHEST 1 VIEW

[AP]
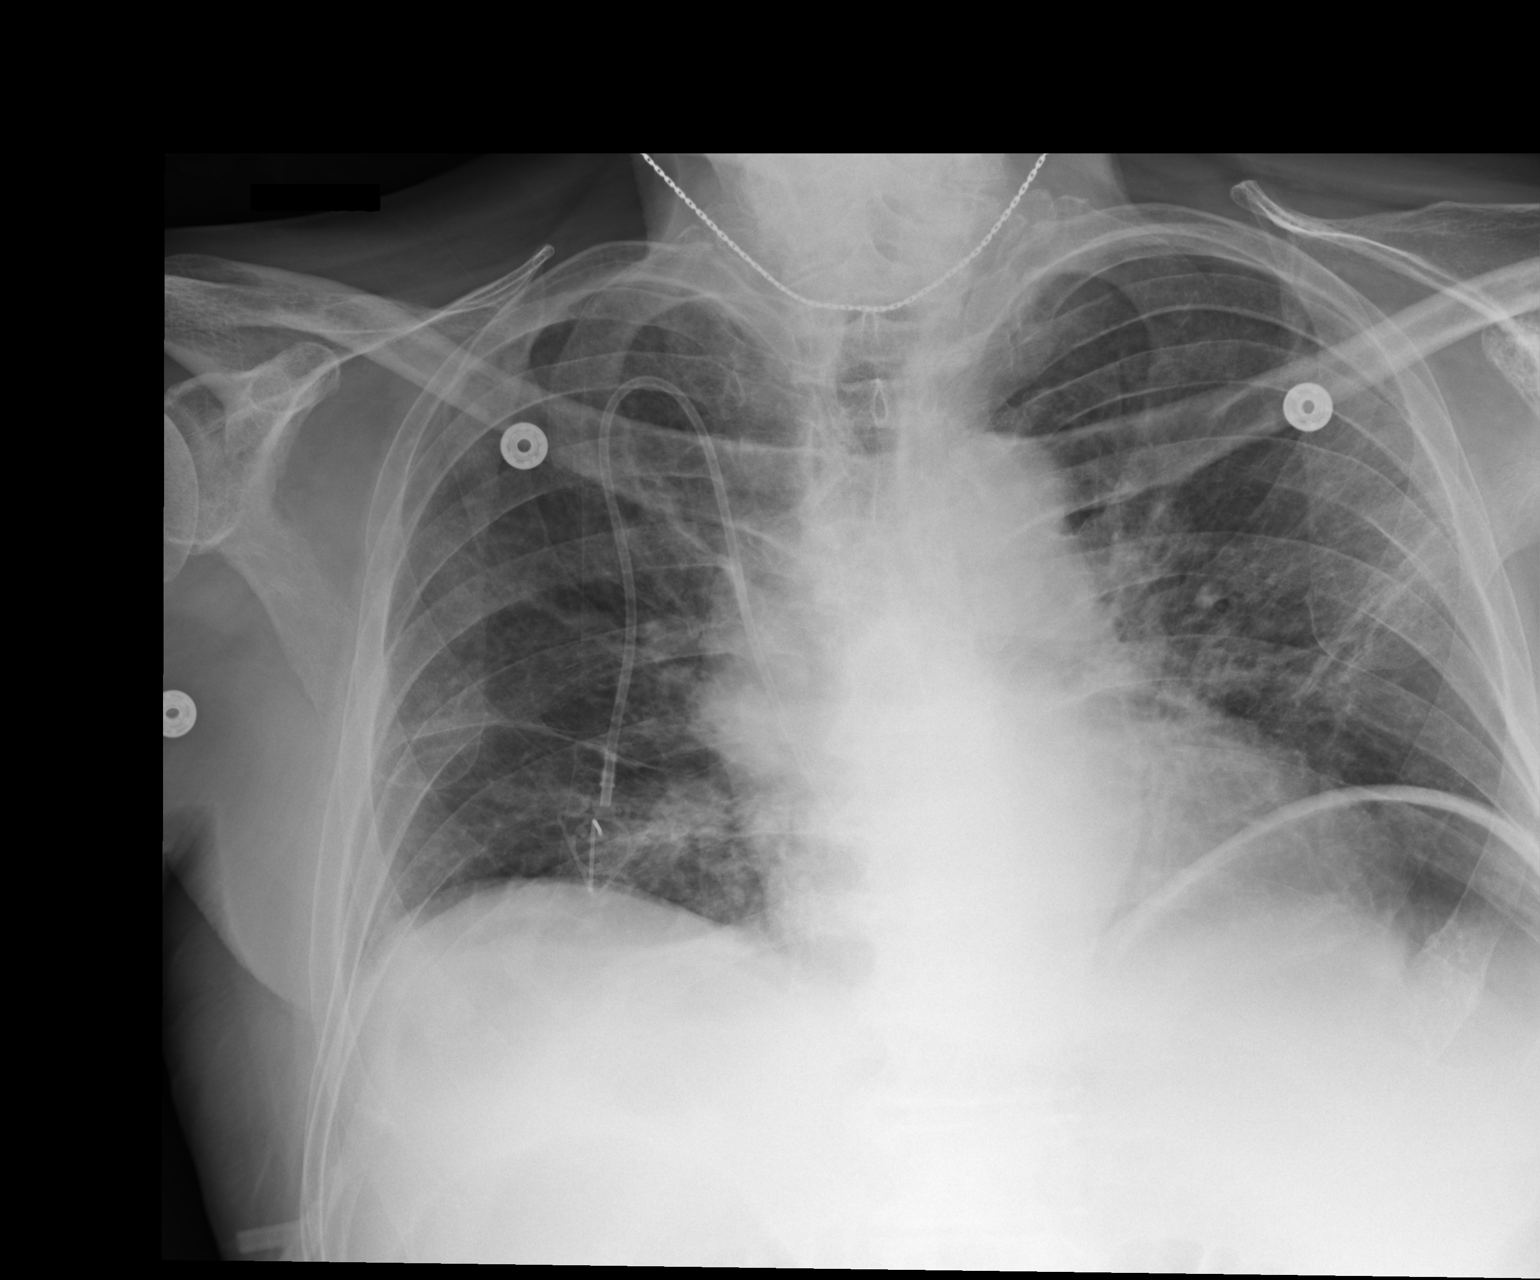

[1 of 1 positions shown; findings below may reference images not displayed]

FINDINGS: Lung volumes are low. Small focus of discoid atelectasis is seen in
the left mid lung zone. The right lung is clear. Small amount of
fluid or thickening of the minor fissure noted. No pneumothorax or
pleural effusion. Heart size is upper normal. Port-A-Cath is in
place.
IMPRESSION: No acute disease. Airspace opacity right in the lung base seen on
the comparison exam has resolved.

## 2017-08-08 IMAGING — DX DG ABD PORTABLE 1V
1 series · 1 of 1 positions shown · non-contrast
Comparison: 07/15/2016.

CLINICAL DATA: Small-bowel obstruction.

EXAM:
PORTABLE ABDOMEN - 1 VIEW

[abdomen supine]
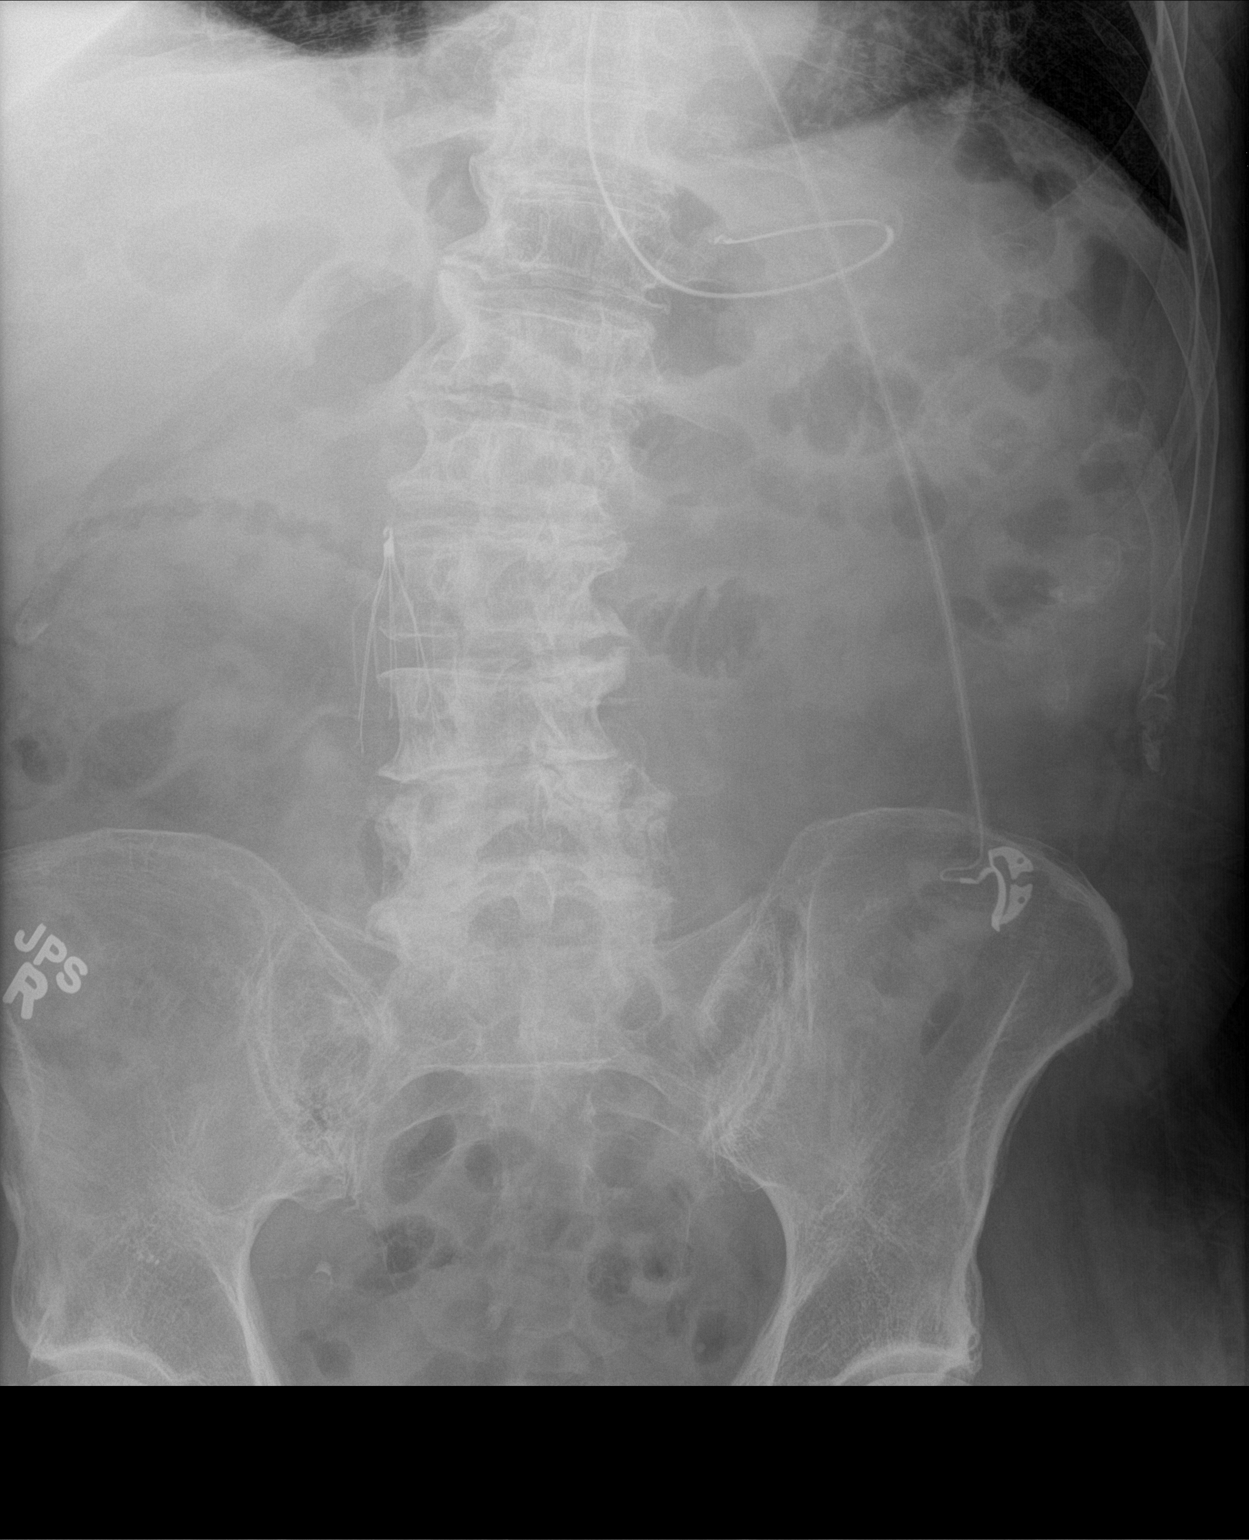

[1 of 1 positions shown; findings below may reference images not displayed]

FINDINGS: NG tube noted coiled in stomach. IVC filter noted in stable
position. Continued improvement of small bowel distention. No free
air. Pelvic calcifications consistent phleboliths. Degenerative
changes scoliosis lumbar spine. Degenerative changes both hips.
IMPRESSION: NG tube noted coiled stomach. Continued interim improvement of small
bowel distention
